# Patient Record
Sex: Female | Born: 1945 | Race: White | Hispanic: No | Marital: Married | Smoking: Never smoker
Health system: Southern US, Community
[De-identification: ages and names within clinical notes are randomized; demographics above are authoritative.]

## PROBLEM LIST (undated history)

## (undated) ENCOUNTER — Encounter: Attending: Geriatric Medicine | Primary: Geriatric Medicine

## (undated) ENCOUNTER — Ambulatory Visit

## (undated) ENCOUNTER — Encounter

## (undated) ENCOUNTER — Encounter: Attending: Hematology & Oncology | Primary: Hematology & Oncology

## (undated) ENCOUNTER — Inpatient Hospital Stay

## (undated) ENCOUNTER — Telehealth

## (undated) ENCOUNTER — Encounter: Attending: Surgical Oncology | Primary: Surgical Oncology

## (undated) ENCOUNTER — Ambulatory Visit: Payer: Medicare (Managed Care)

## (undated) ENCOUNTER — Telehealth: Attending: Family Medicine | Primary: Family Medicine

## (undated) ENCOUNTER — Ambulatory Visit: Payer: MEDICARE

## (undated) ENCOUNTER — Telehealth: Attending: Surgical Oncology | Primary: Surgical Oncology

## (undated) ENCOUNTER — Encounter: Attending: Anesthesiology | Primary: Anesthesiology

## (undated) ENCOUNTER — Telehealth: Attending: Neurology | Primary: Neurology

## (undated) ENCOUNTER — Encounter: Payer: MEDICARE | Attending: Geriatric Medicine | Primary: Geriatric Medicine

## (undated) ENCOUNTER — Telehealth
Attending: Student in an Organized Health Care Education/Training Program | Primary: Student in an Organized Health Care Education/Training Program

## (undated) ENCOUNTER — Encounter: Attending: Family | Primary: Family

## (undated) ENCOUNTER — Ambulatory Visit
Payer: Medicare (Managed Care) | Attending: Student in an Organized Health Care Education/Training Program | Primary: Student in an Organized Health Care Education/Training Program

## (undated) ENCOUNTER — Encounter: Attending: Radiation Oncology | Primary: Radiation Oncology

## (undated) ENCOUNTER — Telehealth: Attending: Hematology & Oncology | Primary: Hematology & Oncology

## (undated) ENCOUNTER — Encounter: Attending: Family Medicine | Primary: Family Medicine

## (undated) ENCOUNTER — Other Ambulatory Visit

## (undated) ENCOUNTER — Encounter: Attending: Diagnostic Radiology | Primary: Diagnostic Radiology

## (undated) ENCOUNTER — Telehealth: Attending: Geriatric Medicine | Primary: Geriatric Medicine

## (undated) ENCOUNTER — Ambulatory Visit: Attending: Surgical Oncology | Primary: Surgical Oncology

## (undated) ENCOUNTER — Ambulatory Visit
Attending: Student in an Organized Health Care Education/Training Program | Primary: Student in an Organized Health Care Education/Training Program

## (undated) ENCOUNTER — Ambulatory Visit: Payer: Medicare (Managed Care) | Attending: Hematology & Oncology | Primary: Hematology & Oncology

## (undated) ENCOUNTER — Ambulatory Visit: Payer: MEDICARE | Attending: Radiation Oncology | Primary: Radiation Oncology

## (undated) ENCOUNTER — Encounter: Payer: MEDICARE | Attending: Radiation Oncology | Primary: Radiation Oncology

## (undated) ENCOUNTER — Ambulatory Visit: Attending: Radiation Oncology | Primary: Radiation Oncology

## (undated) DIAGNOSIS — I10 Essential (primary) hypertension: Secondary | ICD-10-CM

## (undated) DIAGNOSIS — J320 Chronic maxillary sinusitis: Secondary | ICD-10-CM

## (undated) HISTORY — DX: Essential (primary) hypertension: I10

## (undated) HISTORY — DX: Chronic maxillary sinusitis: J32.0

---

## 2008-03-15 ENCOUNTER — Ambulatory Visit: Payer: Self-pay

## 2013-09-21 DIAGNOSIS — I10 Essential (primary) hypertension: Secondary | ICD-10-CM | POA: Insufficient documentation

## 2013-09-21 HISTORY — DX: Essential (primary) hypertension: I10

## 2014-06-29 ENCOUNTER — Ambulatory Visit: Payer: Self-pay | Admitting: Family Medicine

## 2015-02-28 ENCOUNTER — Ambulatory Visit: Payer: Medicare Other

## 2015-02-28 ENCOUNTER — Encounter: Payer: Self-pay | Admitting: Emergency Medicine

## 2015-02-28 ENCOUNTER — Ambulatory Visit
Admission: EM | Admit: 2015-02-28 | Discharge: 2015-02-28 | Disposition: A | Discharge status: Home / Self Care | Payer: Medicare Other | Attending: Family Medicine | Admitting: Family Medicine

## 2015-02-28 DIAGNOSIS — M25561 Pain in right knee: Secondary | ICD-10-CM | POA: Diagnosis not present

## 2015-02-28 HISTORY — DX: Essential (primary) hypertension: I10

## 2015-02-28 MED ORDER — MELOXICAM 7.5 MG PO TABS: 7.5000 mg | ORAL_TABLET | Freq: Every day | ORAL | Status: AC

## 2015-02-28 MED ORDER — TRAMADOL HCL 50 MG PO TABS: 50.0000 mg | ORAL_TABLET | Freq: Three times a day (TID) | ORAL | Status: AC | PRN

## 2015-02-28 NOTE — ED Provider Notes (Signed)
[facility] Emergency Department [place]ama Note  ____________________________________________  Time seen: Approximately 1045 AM  I have reviewed the triage vital signs and the nursing notes.   HISTORY  Chief Complaint Knee Pain   HPI Brandi Hawkins is a 69 y.o. female  presents with a complaint of right knee pain. Patient reports that 3 days ago she was at home. Patient states that she has multiple areas of stairs in her house. Patient states that as she was walking up the stairs she had onset of knee pain. Patient states that went to step up on stair and and it felt like her right knee locked and became painful, and she heard a pop when she put weight on knee with onset of pain. States she thinks she twisted her knee awkwardly during this motion.States that her knee felt like it was going to give out. Patient states that she did fall forward but she caught herself with her hands. Denies head injury or loss of consciousness. Patient states that she does not think she hit her knee on steps. States pain onset was with stepping upwards.   States knee pain was acute onset when walking upstairs. States knee pain is present anterior knee. Denies pain radiation. Denies calf pain. Denies leg swelling. States current pain is 2 out of 10 but states pain with walking is 6 out of 10 and feels like her knee will give out and not hold her weight. Denies previous knee pain.  Denies other pain or injury. Reports continues to ambulate but with pain.   Past Medical History  Diagnosis Date  . Hypertension     There are no active problems to display for this patient.   History reviewed. No pertinent past surgical history.  Current Outpatient Rx  Name  Route  Sig  Dispense  Refill  . amLODipine (NORVASC) 5 MG tablet   Oral   Take 5 mg by mouth daily.         Marland Kitchen lisinopril (PRINIVIL,ZESTRIL) 20 MG tablet   Oral   Take 20 mg by mouth daily.                                  Allergies Sulfur  History reviewed. No pertinent family history.  Social History Social History  Substance Use Topics  . Smoking status: Never Smoker   . Smokeless tobacco: None  . Alcohol Use: Yes    Review of Systems Constitutional: No fever/chills Eyes: No visual changes. ENT: No sore throat. Cardiovascular: Denies chest pain. Respiratory: Denies shortness of breath. Gastrointestinal: No abdominal pain.  No nausea, no vomiting.  No diarrhea.  No constipation. Genitourinary: Negative for dysuria. Musculoskeletal: Negative for back pain. Right knee pain.  Skin: Negative for rash. Neurological: Negative for headaches, focal weakness or numbness.  10-point ROS otherwise negative.  ____________________________________________   PHYSICAL EXAM:  VITAL SIGNS: ED Triage Vitals  Enc Vitals Group     BP 02/28/15 0946 137/63 mmHg     Pulse Rate 02/28/15 0946 111     Resp 02/28/15 0946 20     Temp 02/28/15 0946 98.4 F (36.9 C)     Temp Source 02/28/15 0946 Tympanic     SpO2 02/28/15 0946 99 %     Weight 02/28/15 0946 220 lb (99.791 kg)     Height 02/28/15 0946  (1.651 m)     Head Cir --  Peak Flow --      Pain Score 02/28/15 0949 10     Pain Loc --      Pain Edu? --      Excl. in GC? --    Today's Vitals   02/28/15 0946 02/28/15 0949 02/28/15 1048 02/28/15 1101  BP: 137/63  124/58   Pulse: 111  99   Temp: 98.4 F (36.9 C)  96.3 F (35.7 C)   TempSrc: Tympanic  Tympanic   Resp: 20  17   Height:  (1.651 m)     Weight: 220 lb (99.791 kg)     SpO2: 99%  99%   PainSc:  10-Worst pain ever 5  5      Constitutional: Alert and oriented. Well appearing and in no acute distress. Eyes: Conjunctivae are normal. PERRL. EOMI. Head: Atraumatic.  Nose: No congestion/rhinnorhea.  Mouth/Throat: Mucous membranes are moist.  Neck: No stridor.  No cervical spine tenderness to palpation. Hematological/Lymphatic/Immunilogical: No cervical  lymphadenopathy. Cardiovascular: Normal rate, regular rhythm. Grossly normal heart sounds.  Good peripheral circulation. Respiratory: Normal respiratory effort.  No retractions. Lungs CTAB. Gastrointestinal: Soft and nontender. Obese abdomen. Normal Bowel sounds. No CVA tenderness. Musculoskeletal: No lower or upper extremity tenderness nor edema.  No joint effusions. Bilateral pedal pulses equal and easily palpated. No cervical, thoracic or lumbar TTP. No calf tenderness or left or right leg.  Except: Right anterior knee mild TTP, mild pain with anterior and posterior drawer test, no pain with medial or lateral stress test, no posterior knee pain, calf nontender, no swelling, no ecchymosis. Pain increases to anterior knee with weight bearing. Mild antalgic gait. No calf tenderness. Right leg otherwise nontender.  Neurologic:  Normal speech and language. No gross focal neurologic deficits are appreciated.  Skin:  Skin is warm, dry and intact. No rash noted. Psychiatric: Mood and affect are normal. Speech and behavior are normal.  ____________________________________________   LABS (all labs ordered are listed, but only abnormal results are displayed)  Labs Reviewed - No data to display  RADIOLOGY  RIGHT KNEE - COMPLETE 4+ VIEW  COMPARISON: None.  FINDINGS: There is no evidence of fracture, dislocation, or joint effusion. There is severe osteopenia. There are small lateral femorotibial compartment marginal osteophytes. There is moderate lateral patellofemoral compartment osteoarthritis with joint space narrowing. Soft tissues are unremarkable.  IMPRESSION: No acute osseous injury of the right knee.   Electronically Signed By: Elige Ko On: 02/28/2015 10:16  I, Renford Dills, personally viewed and evaluated these images (plain radiographs) as part of my medical decision making.   ____________________________________________   PROCEDURES  Procedure(s)  performed:  Right knee immobilizer applied by RN. Neurovascular intact post application.   INITIAL IMPRESSION / ASSESSMENT AND PLAN / ED COURSE  Pertinent labs & imaging results that were available during my care of the patient were reviewed by me and considered in my medical decision making (see chart for details).   Very well appearing patient. Presents for complaints of right knee pain. Onset of right knee pain while walking up steps at home. States knee felt like knee locked then popped with onset of pain to front of knee. States she thinks she may have twisted her knee leading to pain onset.   Reports continued knee pain with ambulation and feeling that knee can not hold her weight. Denies calf pain, pain radiation, redness swelling or other complaints. Denies other injury. Will evaluate xray.   Right knee xray no acute osseous injury, severe ostopenia,  moderate lateral patellofemoral compartment osteoarthritis with joint space narrowing. Right knee pain post popping pain sensation while walking up steps and possible twisting movement. Concerned for internal injury including ligamentous or meniscus injury. Will place in knee immobilizer, RX for walker given for support, and treat with mobic and prn tramadol. Discussed with patient to follow-up closely with her primary care physician Dr. Elmer Ramp. Also discussed with patient to also follow-up closely with orthopedic especially if pain continues over the next several days, orthopedic on call information given. Discussed the follow-up with orthopedic for possible internal knee injury such as meniscus or ligamentous injury. Rest ice and elevate.Discussed follow up with Primary care physician this week. Discussed follow up and return parameters including no resolution or any worsening concerns. Patient verbalized understanding and agreed to plan.  ____________________________________________   FINAL CLINICAL IMPRESSION(S) / ED DIAGNOSES  Final  diagnoses:  Right knee pain         Renford Dills, NP 02/28/15 1158  Renford Dills, NP 02/28/15 1159

## 2015-02-28 NOTE — ED Notes (Signed)
Pt with right knee pain after a fall

## 2015-02-28 NOTE — Discharge Instructions (Signed)
Take medication as prescribed. Do not take additional NSAIDS such as advil, aleve or ibuprofen with Mobic. Wear knee immobilizer for support. Use walker. Rest. Apply ice. Avoid strenuous activity.   Follow up with your primary care physician this week as needed. Follow up with orthopedic next week for continued pain. See above to call. Return to Urgent care for new or worsening concerns.   Knee Pain Knee pain can be a result of an injury or other medical conditions. Treatment will depend on the cause of your pain. HOME CARE  Only take medicine as told by your doctor.  Keep a healthy weight. Being overweight can make the knee hurt more.  Stretch before exercising or playing sports.  If there is constant knee pain, change the way you exercise. Ask your doctor for advice.  Make sure shoes fit well. Choose the right shoe for the sport or activity.  Protect your knees. Wear kneepads if needed.  Rest when you are tired. GET HELP RIGHT AWAY IF:   Your knee pain does not stop.  Your knee pain does not get better.  Your knee joint feels hot to the touch.  You have a fever. MAKE SURE YOU:   Understand these instructions.  Will watch this condition.  Will get help right away if you are not doing well or get worse. Document Released: 08/15/2008 Document Revised: 08/11/2011 Document Reviewed: 08/15/2008 [facility] Patient Information 2015 [place], [place]. This information is not intended to replace advice given to you by your health care provider. Make sure you discuss any questions you have with your health care provider.

## 2015-06-18 DIAGNOSIS — J320 Chronic maxillary sinusitis: Secondary | ICD-10-CM

## 2015-06-18 HISTORY — DX: Chronic maxillary sinusitis: J32.0

## 2015-06-20 ENCOUNTER — Ambulatory Visit
Admission: RE | Admit: 2015-06-20 | Discharge: 2015-06-20 | Disposition: A | Discharge status: Home / Self Care | Payer: Medicare Other | Source: Ambulatory Visit | Attending: Unknown Physician Specialty | Admitting: Unknown Physician Specialty

## 2015-06-20 ENCOUNTER — Other Ambulatory Visit: Payer: Self-pay | Admitting: Unknown Physician Specialty

## 2015-06-20 DIAGNOSIS — R059 Cough, unspecified: Secondary | ICD-10-CM

## 2015-06-20 DIAGNOSIS — R042 Hemoptysis: Secondary | ICD-10-CM | POA: Diagnosis not present

## 2015-06-20 DIAGNOSIS — R05 Cough: Secondary | ICD-10-CM | POA: Diagnosis present

## 2015-09-12 ENCOUNTER — Other Ambulatory Visit: Payer: Self-pay | Admitting: Physician Assistant

## 2015-09-12 DIAGNOSIS — R0602 Shortness of breath: Secondary | ICD-10-CM

## 2015-09-18 ENCOUNTER — Ambulatory Visit
Admission: RE | Admit: 2015-09-18 | Discharge: 2015-09-18 | Disposition: A | Discharge status: Home / Self Care | Payer: Medicare Other | Source: Ambulatory Visit | Attending: Physician Assistant | Admitting: Physician Assistant

## 2015-09-18 DIAGNOSIS — R0602 Shortness of breath: Secondary | ICD-10-CM | POA: Insufficient documentation

## 2015-09-18 DIAGNOSIS — R079 Chest pain, unspecified: Secondary | ICD-10-CM | POA: Insufficient documentation

## 2015-09-18 MED ORDER — TECHNETIUM TC 99M SESTAMIBI - CARDIOLITE
31.8500 | Freq: Once | INTRAVENOUS | Status: AC | PRN
  Administered 2015-09-18: 31.85 via INTRAVENOUS

## 2015-09-18 MED ORDER — TECHNETIUM TC 99M SESTAMIBI - CARDIOLITE
12.6100 | Freq: Once | INTRAVENOUS | Status: AC | PRN
Start: 2015-09-18 — End: 2015-09-18
  Administered 2015-09-18: 09:00:00 12.61 via INTRAVENOUS

## 2015-09-18 MED ORDER — REGADENOSON 0.4 MG/5ML IV SOLN
0.4000 mg | Freq: Once | INTRAVENOUS | Status: AC
  Administered 2015-09-18: 0.4 mg via INTRAVENOUS

## 2015-09-19 LAB — NM MYOCAR MULTI W/SPECT W/WALL MOTION / EF
CHL CUP NUCLEAR SDS: 0
CHL CUP STRESS STAGE 1 HR: 90 {beats}/min
CHL CUP STRESS STAGE 1 SPEED: 0 mph
CHL CUP STRESS STAGE 3 GRADE: 0 %
CHL CUP STRESS STAGE 3 HR: 94 {beats}/min
CHL CUP STRESS STAGE 3 SPEED: 0 mph
CHL CUP STRESS STAGE 4 DBP: 65 mmHg
CSEPEW: 1 METS
CSEPPHR: 94 {beats}/min
CSEPPMHR: 62 %
Exercise duration (min): 1 min
LVDIAVOL: 109 mL (ref 46–106)
LVSYSVOL: 48 mL
MPHR: 151 {beats}/min
Percent HR: 64 %
Rest HR: 90 {beats}/min
SRS: 0
SSS: 0
Stage 1 Grade: 0 %
Stage 2 Grade: 0 %
Stage 2 HR: 90 {beats}/min
Stage 2 Speed: 0 mph
Stage 4 Grade: 0 %
Stage 4 HR: 93 {beats}/min
Stage 4 SBP: 155 mmHg
Stage 4 Speed: 0 mph
TID: 0.87

## 2016-04-04 ENCOUNTER — Encounter: Payer: Self-pay | Admitting: Physician Assistant

## 2016-04-04 ENCOUNTER — Telehealth: Payer: Self-pay | Admitting: Gastroenterology

## 2016-04-07 ENCOUNTER — Encounter: Payer: Self-pay | Admitting: Gastroenterology

## 2016-04-07 NOTE — Telephone Encounter (Signed)
Left voice message for patient to call and schedule with GI for Hepatosplenomegaly with fatty liver,suspected biopsy needed referred by Beverely RisenFozia Khan. Letter sent

## 2016-04-30 ENCOUNTER — Encounter: Payer: Self-pay | Admitting: Gastroenterology

## 2016-04-30 ENCOUNTER — Other Ambulatory Visit: Payer: Self-pay

## 2016-04-30 ENCOUNTER — Ambulatory Visit (INDEPENDENT_AMBULATORY_CARE_PROVIDER_SITE_OTHER): Payer: Medicare Other | Admitting: Gastroenterology

## 2016-04-30 VITALS — BP 161/71 | HR 95 | Temp 98.3°F | Ht 64.0 in | Wt 224.0 lb

## 2016-04-30 DIAGNOSIS — R748 Abnormal levels of other serum enzymes: Secondary | ICD-10-CM | POA: Diagnosis not present

## 2016-04-30 NOTE — Progress Notes (Signed)
  Gastroenterology Consultation  Referring Provider:    F. Khan MD Primary Care Physician:  F. Khan MD Primary Gastroenterologist:  Dr. Wohl     Reason for Consultation:     Abnormal liver enzymes        HPI:   Brandi Hawkins is a 70 y.o. y/o female referred for consultation & management of Abnormal liver enzymes by Dr. Virk, Charanjit, MD.  As patient comes in today with a history of abnormal liver enzymes. The patient states she has been under a lot of stress this last year with her dog dying, both the daughters being diagnosed with illnesses and having a lot on her plate as she states it. The patient reports that she was also having increased amounts of alcohol use with 3 glasses of wine a day for a couple of months. She states it helps her sleep because of all the stress. The patient was also found to have a negative acute hepatitis panel and she was found to have elevated iron studies with a slightly elevated ferritin. The patient denies any nausea vomiting black stools or bloody stools. The patient also has never had a screening colonoscopy. The patient recently changed her primary care provider to Dr. Khan. He had an right upper quadrant ultrasound that showed her to have fatty liver with borderline splenomegaly.   Past Medical History:  Diagnosis Date  . 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 HTN (hypertension) 09/21/2013  . Hypertension     History reviewed. No pertinent surgical history.  Prior to Admission medications   Medication Sig Start Date End Date Taking? Authorizing Provider  cetirizine (ZYRTEC) 10 MG tablet Take by mouth.   Yes Historical Provider, MD  hydrochlorothiazide (HYDRODIURIL) 25 MG tablet  04/22/16  Yes Historical Provider, MD  metoprolol succinate (TOPROL-XL) 50 MG 24 hr tablet  04/22/16  Yes Historical Provider, MD  amLODipine (NORVASC) 5 MG tablet Take 5 mg by mouth daily.    Historical Provider, MD  furosemide (LASIX) 20 MG tablet Take by mouth. 07/17/15  07/16/16  Historical Provider, MD  meloxicam (MOBIC) 7.5 MG tablet Take 1 tablet (7.5 mg total) by mouth daily. Patient not taking: Reported on 04/30/2016 02/28/15   Lindsey Miller, NP  traMADol (ULTRAM) 50 MG tablet Take 1 tablet (50 mg total) by mouth every 8 (eight) hours as needed (Do not drive or operate machinery while taking as can cause drowsiness.). Patient not taking: Reported on 04/30/2016 02/28/15   Lindsey Miller, NP    Family History  Problem Relation Age of Onset  . Diabetes Brother   . Diabetes Paternal Grandmother      Social History  Substance Use Topics  . Smoking status: Never Smoker  . Smokeless tobacco: Never Used  . Alcohol use Yes    Allergies as of 04/30/2016 - Review Complete 04/30/2016  Allergen Reaction Noted  . Sulfur Anaphylaxis 02/28/2015    Review of Systems:    All systems reviewed and negative except where noted in HPI.   Physical Exam:  BP (!) 161/71   Pulse 95   Temp 98.3 F (36.8 C) (Oral)   Ht 5' 4" (1.626 m)   Wt 224 lb (101.6 kg)   BMI 38.45 kg/m  No LMP recorded. Patient is postmenopausal. Psych:  Alert and cooperative. Normal mood and affect. General:   Alert,  Well-developed, well-nourished, pleasant and cooperative in NAD Head:  Normocephalic and atraumatic. Eyes:  Sclera clear, no icterus.   Conjunctiva pink. Ears:    Normal auditory acuity. Nose:  No deformity, discharge, or lesions. Mouth:  No deformity or lesions,oropharynx pink & moist. Neck:  Supple; no masses or thyromegaly. Lungs:  Respirations even and unlabored.  Clear throughout to auscultation.   No wheezes, crackles, or rhonchi. No acute distress. Heart:  Regular rate and rhythm; no murmurs, clicks, rubs, or gallops. Abdomen:  Normal bowel sounds.  No bruits.  Soft, non-tender and non-distended without masses, hepatosplenomegaly or hernias noted.  No guarding or rebound tenderness.  Negative Carnett sign.   Rectal:  Deferred.  Msk:  Symmetrical without gross  deformities.  Good, equal movement & strength bilaterally. Pulses:  Normal pulses noted. Extremities:  No clubbing or edema.  No cyanosis. Neurologic:  Alert and oriented x3;  grossly normal neurologically. Skin:  Intact without significant lesions or rashes.  No jaundice. Lymph Nodes:  No significant cervical adenopathy. Psych:  Alert and cooperative. Normal mood and affect.  Imaging Studies: No results found.  Assessment and Plan:   Brandi Hawkins is a 70 y.o. y/o female Who comes in today with an ultrasound showing fatty liver and abnormal liver enzymes with AST being higher than ALT with increased alk phosphatase and high iron levels. The patient will have her labs sent off for possible causes of her abnormal liver enzymes. She will also try and lose weight. The patient has a ready stopped her alcohol use. The patient acute hepatitis panel was negative. The patient has never had a colonoscopy and will be set up for screening colonoscopy. The patient has been explained the plan and agrees with it   Lucilla Lame, MD. Marval Regal   Note: This dictation was prepared with Dragon dictation along with smaller phrase technology. Any transcriptional errors that result from this process are unintentional.

## 2016-05-05 LAB — ALPHA-1-ANTITRYPSIN: A1 ANTITRYPSIN: 143 mg/dL (ref 90–200)

## 2016-05-05 LAB — HEMOCHROMATOSIS DNA-PCR(C282Y,H63D)

## 2016-05-05 LAB — HEPATIC FUNCTION PANEL
ALBUMIN: 4.1 g/dL (ref 3.6–4.8)
ALK PHOS: 129 IU/L — AB (ref 39–117)
ALT: 25 IU/L (ref 0–32)
AST: 56 IU/L — AB (ref 0–40)
BILIRUBIN TOTAL: 3.6 mg/dL — AB (ref 0.0–1.2)
Bilirubin, Direct: 1.01 mg/dL — ABNORMAL HIGH (ref 0.00–0.40)
TOTAL PROTEIN: 8 g/dL (ref 6.0–8.5)

## 2016-05-05 LAB — ANTI-SMOOTH MUSCLE ANTIBODY, IGG: SMOOTH MUSCLE AB: 20 U — AB (ref 0–19)

## 2016-05-05 LAB — MITOCHONDRIAL ANTIBODIES: Mitochondrial Ab: 11.1 Units (ref 0.0–20.0)

## 2016-05-05 LAB — IGG, IGA, IGM
IGA/IMMUNOGLOBULIN A, SERUM: 722 mg/dL — AB (ref 87–352)
IGM (IMMUNOGLOBULIN M), SRM: 97 mg/dL (ref 26–217)
IgG (Immunoglobin G), Serum: 1940 mg/dL — ABNORMAL HIGH (ref 700–1600)

## 2016-05-05 LAB — ANA: Anti Nuclear Antibody(ANA): NEGATIVE

## 2016-05-05 LAB — CERULOPLASMIN: Ceruloplasmin: 23.7 mg/dL (ref 19.0–39.0)

## 2016-05-09 ENCOUNTER — Other Ambulatory Visit: Payer: Self-pay

## 2016-05-28 ENCOUNTER — Telehealth: Payer: Self-pay

## 2016-05-28 NOTE — Telephone Encounter (Signed)
-----   Message from Midge Miniumarren Wohl, MD sent at 05/27/2016  8:26 AM EST ----- Please have the patient come in for a follow up.

## 2016-05-28 NOTE — Telephone Encounter (Signed)
LVM for pt to return my call to schedule follow up appt to discuss lab results.

## 2016-06-12 NOTE — Telephone Encounter (Signed)
Pt has been scheduled for a follow up appt with Dr. Servando SnareWohl on 06/17/16.

## 2016-06-17 ENCOUNTER — Encounter: Payer: Self-pay | Admitting: Gastroenterology

## 2016-06-17 ENCOUNTER — Ambulatory Visit (INDEPENDENT_AMBULATORY_CARE_PROVIDER_SITE_OTHER): Payer: Medicare Other | Admitting: Gastroenterology

## 2016-06-17 VITALS — BP 151/72 | HR 95 | Temp 98.0°F | Ht 64.0 in | Wt 221.5 lb

## 2016-06-17 DIAGNOSIS — R748 Abnormal levels of other serum enzymes: Secondary | ICD-10-CM

## 2016-06-17 NOTE — Progress Notes (Signed)
Primary Care Physician: Sula RumpleVirk, Charanjit, MD  Primary Gastroenterologist:  Dr. Midge Miniumarren Zyshawn Bohnenkamp  Chief Complaint  Patient presents with  . Follow up lab results    HPI: Brandi Hawkins is a 71 y.o. female here for follow-up of abnormal liver enzymes.The patient had labs sent off at our last office visit that showed her to have increased IgG and borderline increase smooth muscle antibody.  The patient had a ultrasound of the abdomen that showed her to have borderline splenomegaly with hepatomegaly and fatty infiltration.  The patient states that she has been trying to lose weight.  She reports that when she gets nervous or has stress in her life she tends to eat more.  Current Outpatient Prescriptions  Medication Sig Dispense Refill  . amLODipine (NORVASC) 5 MG tablet Take 5 mg by mouth daily.    Marland Kitchen. aspirin EC 81 MG tablet Take 81 mg by mouth daily.    . cetirizine (ZYRTEC) 10 MG tablet Take by mouth.    . hydrochlorothiazide (HYDRODIURIL) 25 MG tablet     . metoprolol succinate (TOPROL-XL) 50 MG 24 hr tablet     . furosemide (LASIX) 20 MG tablet Take by mouth.    . meloxicam (MOBIC) 7.5 MG tablet Take 1 tablet (7.5 mg total) by mouth daily. (Patient not taking: Reported on 06/17/2016) 10 tablet 0  . traMADol (ULTRAM) 50 MG tablet Take 1 tablet (50 mg total) by mouth every 8 (eight) hours as needed (Do not drive or operate machinery while taking as can cause drowsiness.). (Patient not taking: Reported on 06/17/2016) 12 tablet 0   No current facility-administered medications for this visit.     Allergies as of 06/17/2016 - Review Complete 06/17/2016  Allergen Reaction Noted  . Sulfur Anaphylaxis 02/28/2015    ROS:  General: Negative for anorexia, weight loss, fever, chills, fatigue, weakness. ENT: Negative for hoarseness, difficulty swallowing , nasal congestion. CV: Negative for chest pain, angina, palpitations, dyspnea on exertion, peripheral edema.  Respiratory: Negative for dyspnea at  rest, dyspnea on exertion, cough, sputum, wheezing.  GI: See history of present illness. GU:  Negative for dysuria, hematuria, urinary incontinence, urinary frequency, nocturnal urination.  Endo: Negative for unusual weight change.    Physical Examination:   BP (!) 151/72   Pulse 95   Temp 98 F (36.7 C) (Oral)   Ht 5\' 4"  (1.626 m)   Wt 221 lb 8 oz (100.5 kg)   BMI 38.02 kg/m   General: Well-nourished, well-developed in no acute distress.  Eyes: No icterus. Conjunctivae pink. Mouth: Oropharyngeal mucosa moist and pink , no lesions erythema or exudate. Lungs: Clear to auscultation bilaterally. Non-labored. Heart: Regular rate and rhythm, no murmurs rubs or gallops.  Abdomen: Bowel sounds are normal, nontender, nondistended, no hepatosplenomegaly or masses, no abdominal bruits or hernia , no rebound or guarding.   Extremities: No lower extremity edema. No clubbing or deformities. Neuro: Alert and oriented x 3.  Grossly intact. Skin: Warm and dry, no jaundice.   Psych: Alert and cooperative, normal mood and affect.  Labs:    Imaging Studies: No results found.  Assessment and Plan:   Brandi Hawkins is a 71 y.o. y/o female who comes in today for follow-up of her abnormal liver enzymes. The patient had a slightly elevated SMA with a increased IgG which may be seen with autoimmune hepatitis.  The patient would like to hold off on any liver biopsies at this time and would like to have her labs  checked again today.  If the labs are going down the patient can be followed while she loses weight.  If the labs are still elevated then the patient will be given a choice of continued weight loss with rechecking in 1 month or being set up for a liver biopsy.  The patient has been explained the plan and agrees with it.    Midge Minium, MD. Clementeen Graham   Note: This dictation was prepared with Dragon dictation along with smaller phrase technology. Any transcriptional errors that result from this process  are unintentional.

## 2016-06-20 ENCOUNTER — Encounter: Payer: Self-pay | Admitting: Gastroenterology

## 2016-06-23 ENCOUNTER — Telehealth: Payer: Self-pay

## 2016-06-23 NOTE — Telephone Encounter (Signed)
-----   Message from Midge Miniumarren Wohl, MD sent at 06/22/2016  5:54 PM EST ----- The patient now that her liver enzymes are around the same level as they were before but not decreasing.  She has an option of repeating them in 1 month to see if they have gone down as she loses weight or being set up for a liver biopsy.

## 2016-06-23 NOTE — Telephone Encounter (Signed)
Pt notified of LFT results. Pt has decided to continue with weight loss to see if enzymes improve. Will contact pt in 1 month for repeat labs.

## 2016-09-30 IMAGING — CR DG KNEE COMPLETE 4+V*R*
4 series · 4 of 4 positions shown · non-contrast
Comparison: None.

CLINICAL DATA: Pain and swelling after fall

EXAM:
RIGHT KNEE - COMPLETE 4+ VIEW

[knee ap]
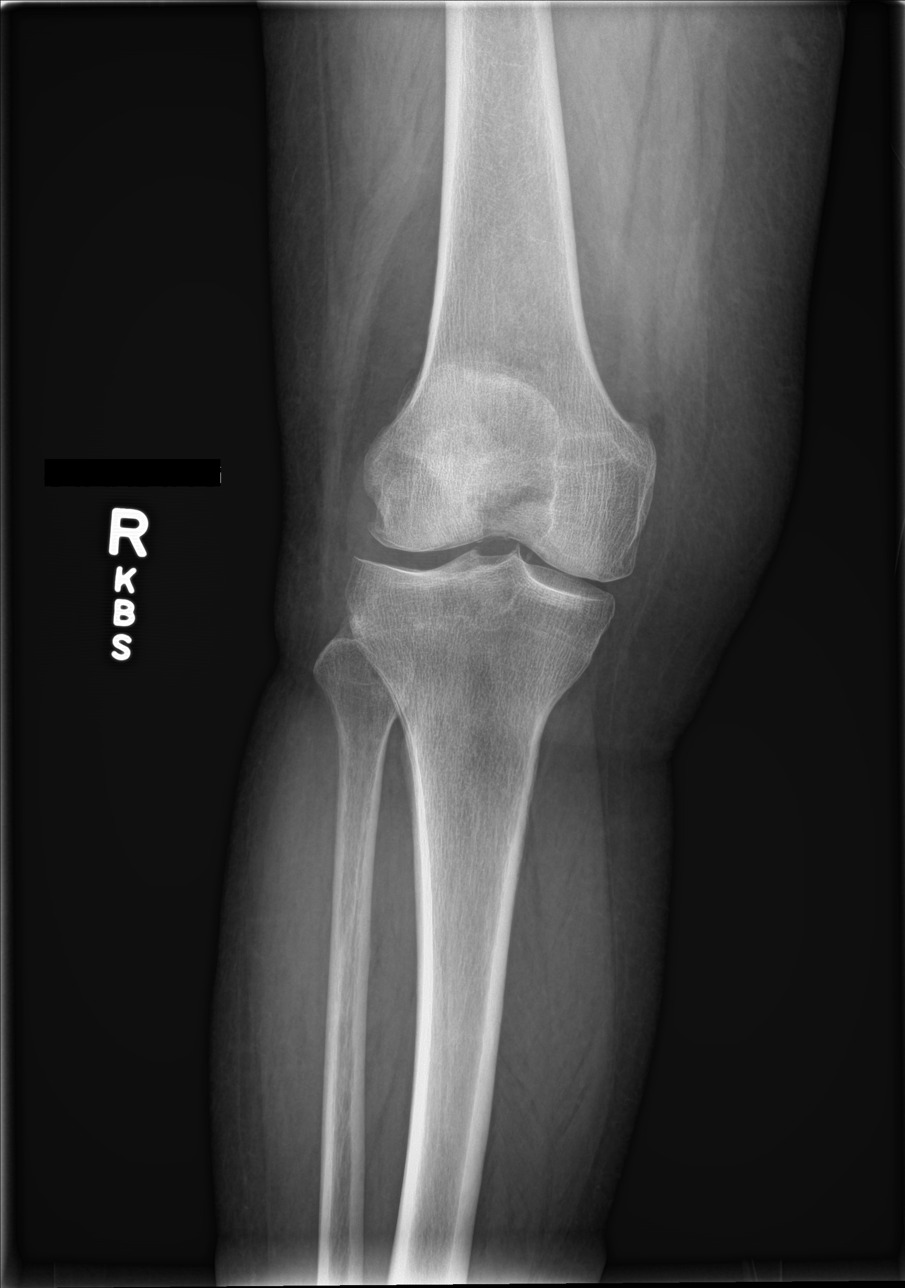

[tunnel]
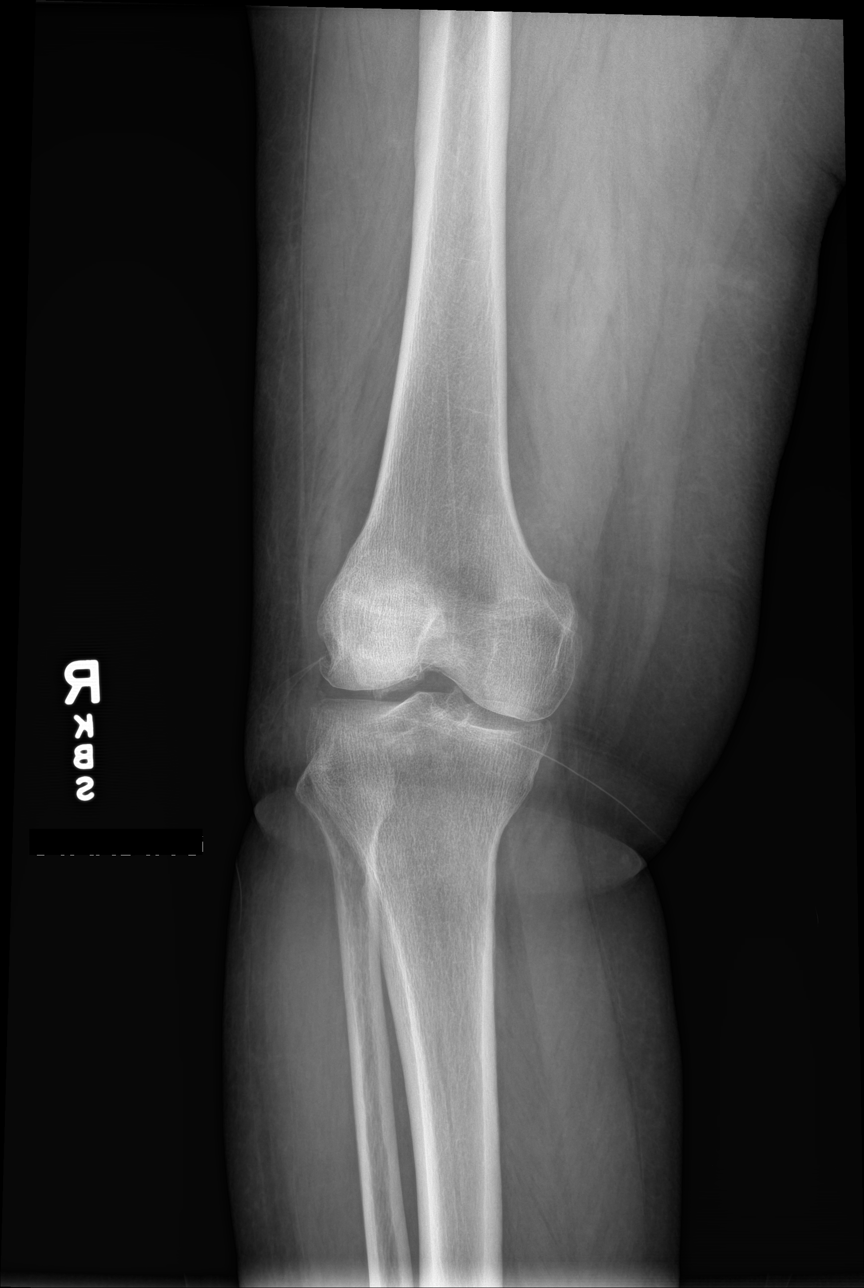

[knee lat]
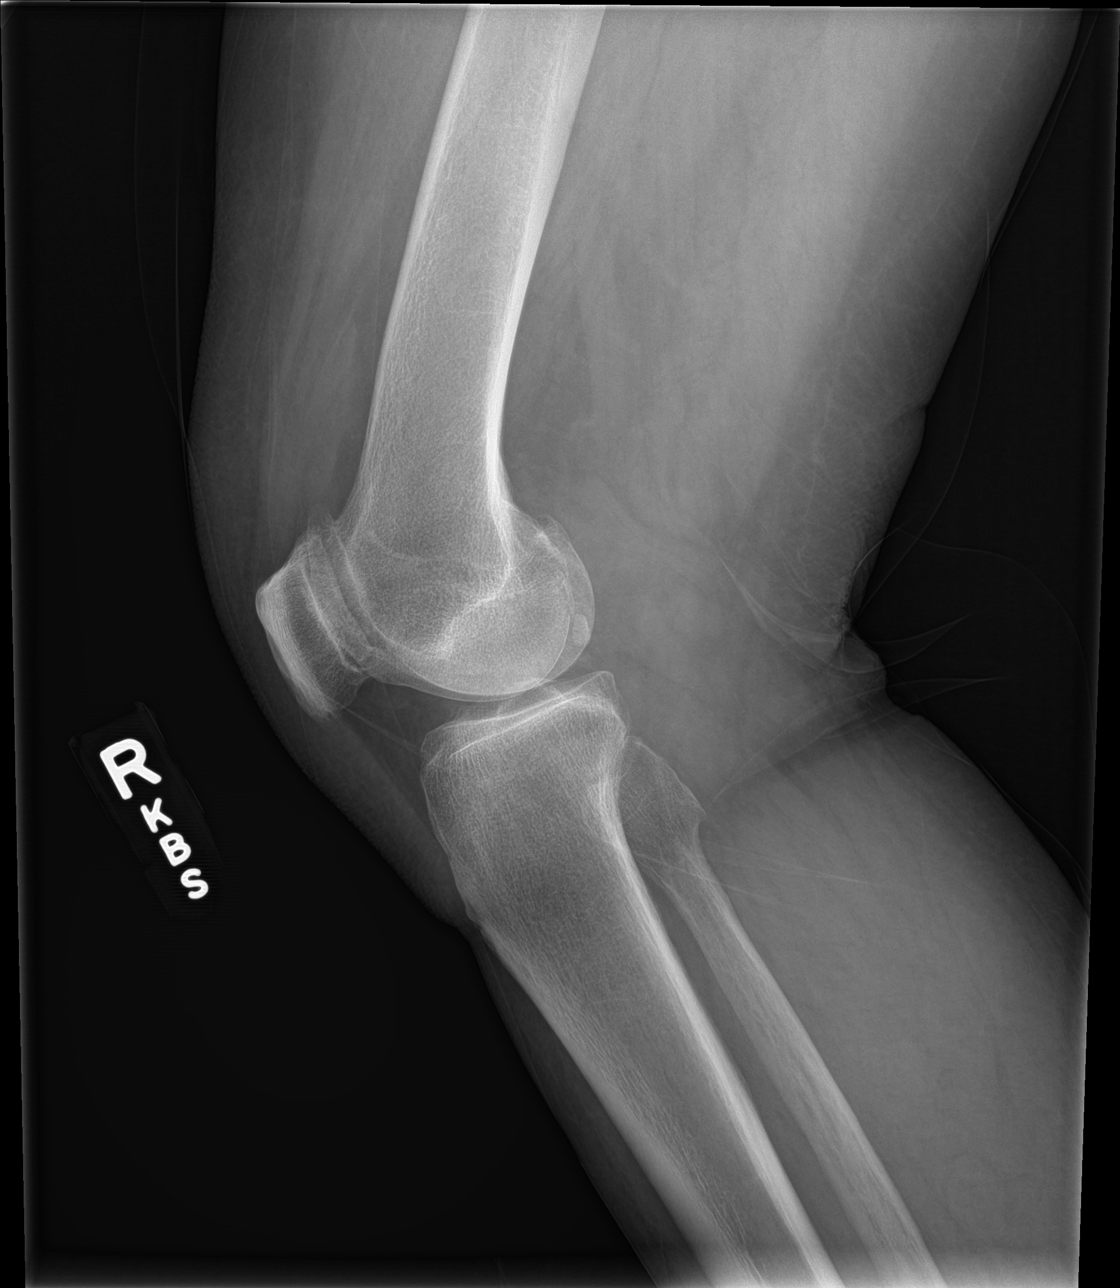

[patella skyline]
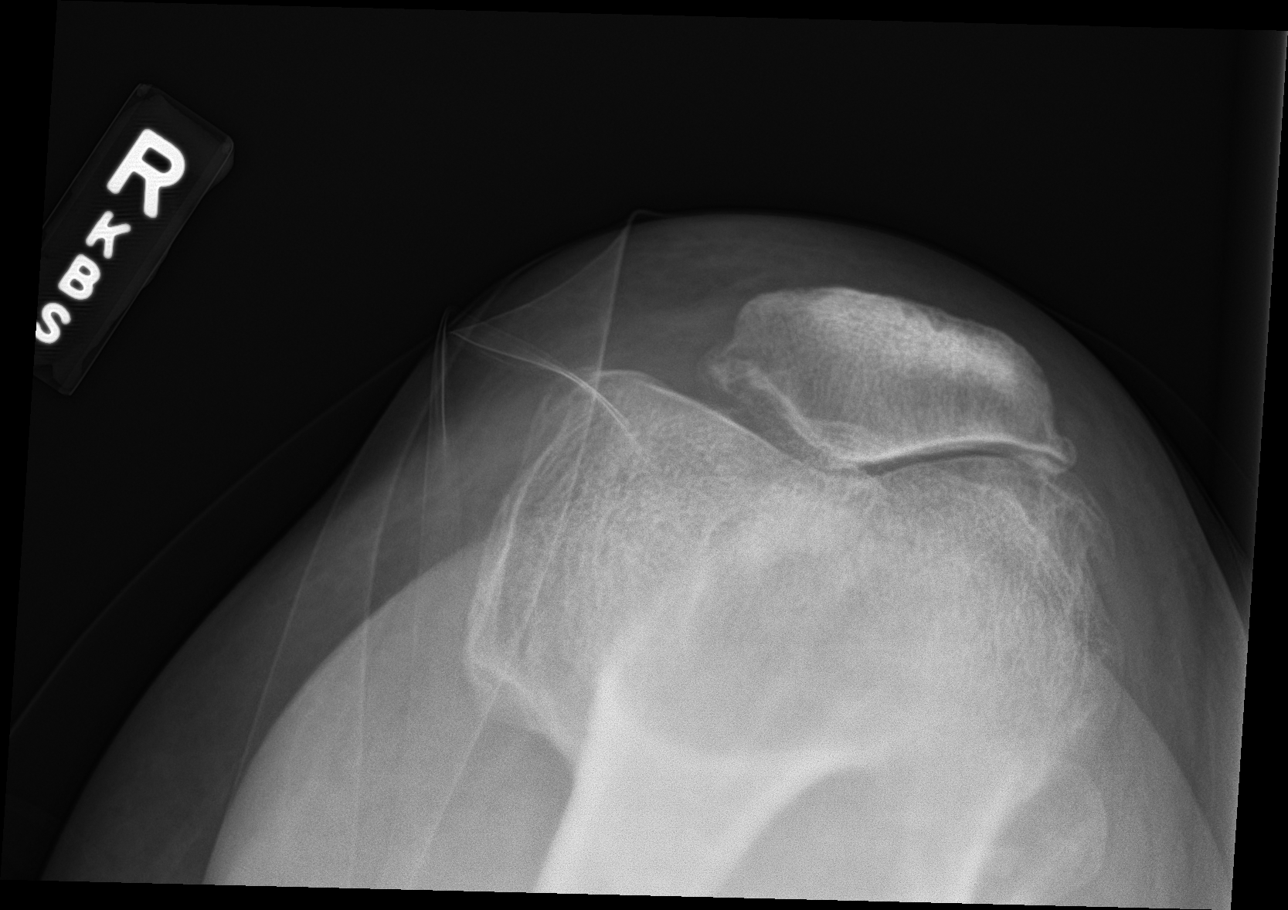

[4 of 4 positions shown; findings below may reference images not displayed]

FINDINGS: There is no evidence of fracture, dislocation, or joint effusion.
There is severe osteopenia. There are small lateral femorotibial
compartment marginal osteophytes. There is moderate lateral
patellofemoral compartment osteoarthritis with joint space
narrowing. Soft tissues are unremarkable.
IMPRESSION: No acute osseous injury of the right knee.

## 2017-06-03 ENCOUNTER — Ambulatory Visit
Admit: 2017-06-03 | Discharge: 2017-06-06 | Disposition: A | Discharge status: Home / Self Care | Payer: MEDICARE | Admitting: Family Medicine

## 2017-06-03 DIAGNOSIS — E876 Hypokalemia: Secondary | ICD-10-CM | POA: Insufficient documentation

## 2017-06-03 DIAGNOSIS — K709 Alcoholic liver disease, unspecified: Secondary | ICD-10-CM | POA: Insufficient documentation

## 2017-06-04 DIAGNOSIS — E876 Hypokalemia: Principal | ICD-10-CM

## 2017-06-05 DIAGNOSIS — J320 Chronic maxillary sinusitis: Secondary | ICD-10-CM

## 2017-06-05 DIAGNOSIS — E876 Hypokalemia: Secondary | ICD-10-CM

## 2017-06-05 DIAGNOSIS — K709 Alcoholic liver disease, unspecified: Principal | ICD-10-CM

## 2017-06-05 DIAGNOSIS — K766 Portal hypertension: Secondary | ICD-10-CM

## 2017-06-05 DIAGNOSIS — B373 Candidiasis of vulva and vagina: Secondary | ICD-10-CM

## 2017-06-05 DIAGNOSIS — N950 Postmenopausal bleeding: Secondary | ICD-10-CM

## 2017-06-05 DIAGNOSIS — I8500 Esophageal varices without bleeding: Secondary | ICD-10-CM

## 2017-06-05 DIAGNOSIS — I10 Essential (primary) hypertension: Secondary | ICD-10-CM

## 2017-06-05 DIAGNOSIS — F1020 Alcohol dependence, uncomplicated: Secondary | ICD-10-CM

## 2017-06-05 DIAGNOSIS — Z7982 Long term (current) use of aspirin: Secondary | ICD-10-CM

## 2017-06-09 ENCOUNTER — Ambulatory Visit: Admit: 2017-06-09 | Discharge: 2017-06-10 | Payer: MEDICARE | Attending: Family | Primary: Family

## 2017-06-09 DIAGNOSIS — R4182 Altered mental status, unspecified: Secondary | ICD-10-CM

## 2017-06-09 DIAGNOSIS — E876 Hypokalemia: Secondary | ICD-10-CM

## 2017-06-09 DIAGNOSIS — N950 Postmenopausal bleeding: Secondary | ICD-10-CM

## 2017-06-09 DIAGNOSIS — K709 Alcoholic liver disease, unspecified: Principal | ICD-10-CM

## 2017-06-09 DIAGNOSIS — I10 Essential (primary) hypertension: Secondary | ICD-10-CM

## 2017-06-10 ENCOUNTER — Encounter: Admit: 2017-06-10 | Discharge: 2017-07-29 | Payer: MEDICARE

## 2017-06-10 DIAGNOSIS — K766 Portal hypertension: Secondary | ICD-10-CM

## 2017-06-10 DIAGNOSIS — Z7982 Long term (current) use of aspirin: Secondary | ICD-10-CM

## 2017-06-10 DIAGNOSIS — I8500 Esophageal varices without bleeding: Secondary | ICD-10-CM

## 2017-06-10 DIAGNOSIS — E876 Hypokalemia: Secondary | ICD-10-CM

## 2017-06-10 DIAGNOSIS — J320 Chronic maxillary sinusitis: Secondary | ICD-10-CM

## 2017-06-10 DIAGNOSIS — I10 Essential (primary) hypertension: Secondary | ICD-10-CM

## 2017-06-10 DIAGNOSIS — K709 Alcoholic liver disease, unspecified: Principal | ICD-10-CM

## 2017-06-10 DIAGNOSIS — N950 Postmenopausal bleeding: Secondary | ICD-10-CM

## 2017-06-10 DIAGNOSIS — B373 Candidiasis of vulva and vagina: Secondary | ICD-10-CM

## 2017-06-10 DIAGNOSIS — F1020 Alcohol dependence, uncomplicated: Secondary | ICD-10-CM

## 2017-06-15 DIAGNOSIS — E876 Hypokalemia: Secondary | ICD-10-CM

## 2017-06-15 DIAGNOSIS — J320 Chronic maxillary sinusitis: Secondary | ICD-10-CM

## 2017-06-15 DIAGNOSIS — N950 Postmenopausal bleeding: Secondary | ICD-10-CM

## 2017-06-15 DIAGNOSIS — Z7982 Long term (current) use of aspirin: Secondary | ICD-10-CM

## 2017-06-15 DIAGNOSIS — B373 Candidiasis of vulva and vagina: Secondary | ICD-10-CM

## 2017-06-15 DIAGNOSIS — I8500 Esophageal varices without bleeding: Secondary | ICD-10-CM

## 2017-06-15 DIAGNOSIS — F1020 Alcohol dependence, uncomplicated: Secondary | ICD-10-CM

## 2017-06-15 DIAGNOSIS — K766 Portal hypertension: Secondary | ICD-10-CM

## 2017-06-15 DIAGNOSIS — I10 Essential (primary) hypertension: Secondary | ICD-10-CM

## 2017-06-15 DIAGNOSIS — K709 Alcoholic liver disease, unspecified: Principal | ICD-10-CM

## 2017-06-17 DIAGNOSIS — K766 Portal hypertension: Secondary | ICD-10-CM

## 2017-06-17 DIAGNOSIS — E876 Hypokalemia: Secondary | ICD-10-CM

## 2017-06-17 DIAGNOSIS — I8500 Esophageal varices without bleeding: Secondary | ICD-10-CM

## 2017-06-17 DIAGNOSIS — J320 Chronic maxillary sinusitis: Secondary | ICD-10-CM

## 2017-06-17 DIAGNOSIS — K709 Alcoholic liver disease, unspecified: Principal | ICD-10-CM

## 2017-06-17 DIAGNOSIS — F1020 Alcohol dependence, uncomplicated: Secondary | ICD-10-CM

## 2017-06-17 DIAGNOSIS — Z7982 Long term (current) use of aspirin: Secondary | ICD-10-CM

## 2017-06-17 DIAGNOSIS — N950 Postmenopausal bleeding: Secondary | ICD-10-CM

## 2017-06-17 DIAGNOSIS — B373 Candidiasis of vulva and vagina: Secondary | ICD-10-CM

## 2017-06-17 DIAGNOSIS — I10 Essential (primary) hypertension: Secondary | ICD-10-CM

## 2017-06-18 DIAGNOSIS — K766 Portal hypertension: Secondary | ICD-10-CM

## 2017-06-18 DIAGNOSIS — B373 Candidiasis of vulva and vagina: Secondary | ICD-10-CM

## 2017-06-18 DIAGNOSIS — N950 Postmenopausal bleeding: Secondary | ICD-10-CM

## 2017-06-18 DIAGNOSIS — K709 Alcoholic liver disease, unspecified: Principal | ICD-10-CM

## 2017-06-18 DIAGNOSIS — I10 Essential (primary) hypertension: Secondary | ICD-10-CM

## 2017-06-18 DIAGNOSIS — F1020 Alcohol dependence, uncomplicated: Secondary | ICD-10-CM

## 2017-06-18 DIAGNOSIS — J320 Chronic maxillary sinusitis: Secondary | ICD-10-CM

## 2017-06-18 DIAGNOSIS — E876 Hypokalemia: Secondary | ICD-10-CM

## 2017-06-18 DIAGNOSIS — Z7982 Long term (current) use of aspirin: Secondary | ICD-10-CM

## 2017-06-18 DIAGNOSIS — I8500 Esophageal varices without bleeding: Secondary | ICD-10-CM

## 2017-06-19 DIAGNOSIS — I8500 Esophageal varices without bleeding: Secondary | ICD-10-CM

## 2017-06-19 DIAGNOSIS — K709 Alcoholic liver disease, unspecified: Principal | ICD-10-CM

## 2017-06-19 DIAGNOSIS — J320 Chronic maxillary sinusitis: Secondary | ICD-10-CM

## 2017-06-19 DIAGNOSIS — E876 Hypokalemia: Secondary | ICD-10-CM

## 2017-06-19 DIAGNOSIS — N950 Postmenopausal bleeding: Secondary | ICD-10-CM

## 2017-06-19 DIAGNOSIS — F1020 Alcohol dependence, uncomplicated: Secondary | ICD-10-CM

## 2017-06-19 DIAGNOSIS — K766 Portal hypertension: Secondary | ICD-10-CM

## 2017-06-19 DIAGNOSIS — Z7982 Long term (current) use of aspirin: Secondary | ICD-10-CM

## 2017-06-19 DIAGNOSIS — B373 Candidiasis of vulva and vagina: Secondary | ICD-10-CM

## 2017-06-19 DIAGNOSIS — I10 Essential (primary) hypertension: Secondary | ICD-10-CM

## 2017-06-24 DIAGNOSIS — K766 Portal hypertension: Secondary | ICD-10-CM

## 2017-06-24 DIAGNOSIS — J320 Chronic maxillary sinusitis: Secondary | ICD-10-CM

## 2017-06-24 DIAGNOSIS — N950 Postmenopausal bleeding: Secondary | ICD-10-CM

## 2017-06-24 DIAGNOSIS — E876 Hypokalemia: Secondary | ICD-10-CM

## 2017-06-24 DIAGNOSIS — F1020 Alcohol dependence, uncomplicated: Secondary | ICD-10-CM

## 2017-06-24 DIAGNOSIS — I10 Essential (primary) hypertension: Secondary | ICD-10-CM

## 2017-06-24 DIAGNOSIS — Z7982 Long term (current) use of aspirin: Secondary | ICD-10-CM

## 2017-06-24 DIAGNOSIS — K709 Alcoholic liver disease, unspecified: Principal | ICD-10-CM

## 2017-06-24 DIAGNOSIS — B373 Candidiasis of vulva and vagina: Secondary | ICD-10-CM

## 2017-06-24 DIAGNOSIS — I8500 Esophageal varices without bleeding: Secondary | ICD-10-CM

## 2017-06-26 DIAGNOSIS — K709 Alcoholic liver disease, unspecified: Principal | ICD-10-CM

## 2017-06-26 DIAGNOSIS — B373 Candidiasis of vulva and vagina: Secondary | ICD-10-CM

## 2017-06-26 DIAGNOSIS — E876 Hypokalemia: Secondary | ICD-10-CM

## 2017-06-26 DIAGNOSIS — J320 Chronic maxillary sinusitis: Secondary | ICD-10-CM

## 2017-06-26 DIAGNOSIS — F1020 Alcohol dependence, uncomplicated: Secondary | ICD-10-CM

## 2017-06-26 DIAGNOSIS — N950 Postmenopausal bleeding: Secondary | ICD-10-CM

## 2017-06-26 DIAGNOSIS — K766 Portal hypertension: Secondary | ICD-10-CM

## 2017-06-26 DIAGNOSIS — I10 Essential (primary) hypertension: Secondary | ICD-10-CM

## 2017-06-26 DIAGNOSIS — Z7982 Long term (current) use of aspirin: Secondary | ICD-10-CM

## 2017-06-26 DIAGNOSIS — I8500 Esophageal varices without bleeding: Secondary | ICD-10-CM

## 2017-06-26 MED ORDER — LACTULOSE 10 GRAM/15 ML ORAL SOLUTION
Freq: Three times a day (TID) | ORAL | 1 refills | 0.00000 days | Status: CP
Start: 2017-06-26 — End: 2017-06-29

## 2017-06-29 DIAGNOSIS — J320 Chronic maxillary sinusitis: Secondary | ICD-10-CM

## 2017-06-29 DIAGNOSIS — I8500 Esophageal varices without bleeding: Secondary | ICD-10-CM

## 2017-06-29 DIAGNOSIS — I10 Essential (primary) hypertension: Secondary | ICD-10-CM

## 2017-06-29 DIAGNOSIS — E876 Hypokalemia: Secondary | ICD-10-CM

## 2017-06-29 DIAGNOSIS — Z7982 Long term (current) use of aspirin: Secondary | ICD-10-CM

## 2017-06-29 DIAGNOSIS — F1020 Alcohol dependence, uncomplicated: Secondary | ICD-10-CM

## 2017-06-29 DIAGNOSIS — N950 Postmenopausal bleeding: Secondary | ICD-10-CM

## 2017-06-29 DIAGNOSIS — K766 Portal hypertension: Secondary | ICD-10-CM

## 2017-06-29 DIAGNOSIS — K709 Alcoholic liver disease, unspecified: Principal | ICD-10-CM

## 2017-06-29 DIAGNOSIS — B373 Candidiasis of vulva and vagina: Secondary | ICD-10-CM

## 2017-06-29 MED ORDER — LACTULOSE 10 GRAM/15 ML ORAL SOLUTION
Freq: Three times a day (TID) | ORAL | 1 refills | 0.00000 days | Status: CP
Start: 2017-06-29 — End: 2017-08-26

## 2017-06-30 DIAGNOSIS — K709 Alcoholic liver disease, unspecified: Principal | ICD-10-CM

## 2017-06-30 DIAGNOSIS — E876 Hypokalemia: Secondary | ICD-10-CM

## 2017-06-30 DIAGNOSIS — F1020 Alcohol dependence, uncomplicated: Secondary | ICD-10-CM

## 2017-06-30 DIAGNOSIS — J320 Chronic maxillary sinusitis: Secondary | ICD-10-CM

## 2017-06-30 DIAGNOSIS — I8500 Esophageal varices without bleeding: Secondary | ICD-10-CM

## 2017-06-30 DIAGNOSIS — K766 Portal hypertension: Secondary | ICD-10-CM

## 2017-06-30 DIAGNOSIS — Z7982 Long term (current) use of aspirin: Secondary | ICD-10-CM

## 2017-06-30 DIAGNOSIS — B373 Candidiasis of vulva and vagina: Secondary | ICD-10-CM

## 2017-06-30 DIAGNOSIS — I10 Essential (primary) hypertension: Secondary | ICD-10-CM

## 2017-06-30 DIAGNOSIS — N950 Postmenopausal bleeding: Secondary | ICD-10-CM

## 2017-07-01 DIAGNOSIS — N950 Postmenopausal bleeding: Secondary | ICD-10-CM

## 2017-07-01 DIAGNOSIS — Z7982 Long term (current) use of aspirin: Secondary | ICD-10-CM

## 2017-07-01 DIAGNOSIS — B373 Candidiasis of vulva and vagina: Secondary | ICD-10-CM

## 2017-07-01 DIAGNOSIS — K709 Alcoholic liver disease, unspecified: Principal | ICD-10-CM

## 2017-07-01 DIAGNOSIS — J320 Chronic maxillary sinusitis: Secondary | ICD-10-CM

## 2017-07-01 DIAGNOSIS — F1020 Alcohol dependence, uncomplicated: Secondary | ICD-10-CM

## 2017-07-01 DIAGNOSIS — K766 Portal hypertension: Secondary | ICD-10-CM

## 2017-07-01 DIAGNOSIS — I8500 Esophageal varices without bleeding: Secondary | ICD-10-CM

## 2017-07-01 DIAGNOSIS — E876 Hypokalemia: Secondary | ICD-10-CM

## 2017-07-01 DIAGNOSIS — I10 Essential (primary) hypertension: Secondary | ICD-10-CM

## 2017-07-03 ENCOUNTER — Encounter: Admit: 2017-07-03 | Discharge: 2017-07-04 | Payer: MEDICARE

## 2017-07-03 DIAGNOSIS — J320 Chronic maxillary sinusitis: Secondary | ICD-10-CM

## 2017-07-03 DIAGNOSIS — F1020 Alcohol dependence, uncomplicated: Principal | ICD-10-CM

## 2017-07-03 DIAGNOSIS — F329 Major depressive disorder, single episode, unspecified: Secondary | ICD-10-CM

## 2017-07-03 DIAGNOSIS — K709 Alcoholic liver disease, unspecified: Principal | ICD-10-CM

## 2017-07-03 DIAGNOSIS — I10 Essential (primary) hypertension: Secondary | ICD-10-CM

## 2017-07-03 DIAGNOSIS — N950 Postmenopausal bleeding: Secondary | ICD-10-CM

## 2017-07-03 DIAGNOSIS — K7460 Unspecified cirrhosis of liver: Secondary | ICD-10-CM

## 2017-07-03 DIAGNOSIS — Z1239 Encounter for other screening for malignant neoplasm of breast: Secondary | ICD-10-CM

## 2017-07-03 DIAGNOSIS — G9340 Encephalopathy, unspecified: Secondary | ICD-10-CM

## 2017-07-03 DIAGNOSIS — E876 Hypokalemia: Secondary | ICD-10-CM

## 2017-07-03 DIAGNOSIS — K766 Portal hypertension: Secondary | ICD-10-CM

## 2017-07-03 DIAGNOSIS — I8500 Esophageal varices without bleeding: Secondary | ICD-10-CM

## 2017-07-03 DIAGNOSIS — D7589 Other specified diseases of blood and blood-forming organs: Secondary | ICD-10-CM

## 2017-07-03 DIAGNOSIS — B373 Candidiasis of vulva and vagina: Secondary | ICD-10-CM

## 2017-07-03 DIAGNOSIS — Z7982 Long term (current) use of aspirin: Secondary | ICD-10-CM

## 2017-07-03 MED ORDER — CARVEDILOL 3.125 MG TABLET: 3 mg | tablet | Freq: Two times a day (BID) | 11 refills | 0 days | Status: AC

## 2017-07-03 MED ORDER — ESCITALOPRAM 10 MG TABLET
ORAL_TABLET | 6 refills | 0 days | Status: CP
Start: 2017-07-03 — End: 2017-09-16

## 2017-07-03 MED ORDER — CARVEDILOL 3.125 MG TABLET
ORAL_TABLET | Freq: Two times a day (BID) | ORAL | 11 refills | 0.00000 days | Status: CP
Start: 2017-07-03 — End: 2018-05-24

## 2017-07-06 DIAGNOSIS — F1020 Alcohol dependence, uncomplicated: Secondary | ICD-10-CM

## 2017-07-06 DIAGNOSIS — J320 Chronic maxillary sinusitis: Secondary | ICD-10-CM

## 2017-07-06 DIAGNOSIS — I8500 Esophageal varices without bleeding: Secondary | ICD-10-CM

## 2017-07-06 DIAGNOSIS — Z7982 Long term (current) use of aspirin: Secondary | ICD-10-CM

## 2017-07-06 DIAGNOSIS — E876 Hypokalemia: Secondary | ICD-10-CM

## 2017-07-06 DIAGNOSIS — I10 Essential (primary) hypertension: Secondary | ICD-10-CM

## 2017-07-06 DIAGNOSIS — K766 Portal hypertension: Secondary | ICD-10-CM

## 2017-07-06 DIAGNOSIS — K709 Alcoholic liver disease, unspecified: Principal | ICD-10-CM

## 2017-07-06 DIAGNOSIS — B373 Candidiasis of vulva and vagina: Secondary | ICD-10-CM

## 2017-07-06 DIAGNOSIS — N950 Postmenopausal bleeding: Secondary | ICD-10-CM

## 2017-07-07 DIAGNOSIS — I8500 Esophageal varices without bleeding: Secondary | ICD-10-CM

## 2017-07-07 DIAGNOSIS — K709 Alcoholic liver disease, unspecified: Principal | ICD-10-CM

## 2017-07-07 DIAGNOSIS — J320 Chronic maxillary sinusitis: Secondary | ICD-10-CM

## 2017-07-07 DIAGNOSIS — F1020 Alcohol dependence, uncomplicated: Secondary | ICD-10-CM

## 2017-07-07 DIAGNOSIS — E876 Hypokalemia: Secondary | ICD-10-CM

## 2017-07-07 DIAGNOSIS — K766 Portal hypertension: Secondary | ICD-10-CM

## 2017-07-07 DIAGNOSIS — N950 Postmenopausal bleeding: Secondary | ICD-10-CM

## 2017-07-07 DIAGNOSIS — Z7982 Long term (current) use of aspirin: Secondary | ICD-10-CM

## 2017-07-07 DIAGNOSIS — B373 Candidiasis of vulva and vagina: Secondary | ICD-10-CM

## 2017-07-07 DIAGNOSIS — I10 Essential (primary) hypertension: Secondary | ICD-10-CM

## 2017-07-10 DIAGNOSIS — I8500 Esophageal varices without bleeding: Secondary | ICD-10-CM

## 2017-07-10 DIAGNOSIS — Z7982 Long term (current) use of aspirin: Secondary | ICD-10-CM

## 2017-07-10 DIAGNOSIS — B373 Candidiasis of vulva and vagina: Secondary | ICD-10-CM

## 2017-07-10 DIAGNOSIS — N950 Postmenopausal bleeding: Secondary | ICD-10-CM

## 2017-07-10 DIAGNOSIS — I10 Essential (primary) hypertension: Secondary | ICD-10-CM

## 2017-07-10 DIAGNOSIS — J320 Chronic maxillary sinusitis: Secondary | ICD-10-CM

## 2017-07-10 DIAGNOSIS — K709 Alcoholic liver disease, unspecified: Principal | ICD-10-CM

## 2017-07-10 DIAGNOSIS — K766 Portal hypertension: Secondary | ICD-10-CM

## 2017-07-10 DIAGNOSIS — F1020 Alcohol dependence, uncomplicated: Secondary | ICD-10-CM

## 2017-07-10 DIAGNOSIS — E876 Hypokalemia: Secondary | ICD-10-CM

## 2017-07-14 DIAGNOSIS — J320 Chronic maxillary sinusitis: Secondary | ICD-10-CM

## 2017-07-14 DIAGNOSIS — B373 Candidiasis of vulva and vagina: Secondary | ICD-10-CM

## 2017-07-14 DIAGNOSIS — Z7982 Long term (current) use of aspirin: Secondary | ICD-10-CM

## 2017-07-14 DIAGNOSIS — K709 Alcoholic liver disease, unspecified: Principal | ICD-10-CM

## 2017-07-14 DIAGNOSIS — E876 Hypokalemia: Secondary | ICD-10-CM

## 2017-07-14 DIAGNOSIS — N950 Postmenopausal bleeding: Secondary | ICD-10-CM

## 2017-07-14 DIAGNOSIS — K766 Portal hypertension: Secondary | ICD-10-CM

## 2017-07-14 DIAGNOSIS — I10 Essential (primary) hypertension: Secondary | ICD-10-CM

## 2017-07-14 DIAGNOSIS — I8500 Esophageal varices without bleeding: Secondary | ICD-10-CM

## 2017-07-14 DIAGNOSIS — F1020 Alcohol dependence, uncomplicated: Secondary | ICD-10-CM

## 2017-07-15 DIAGNOSIS — N950 Postmenopausal bleeding: Secondary | ICD-10-CM

## 2017-07-15 DIAGNOSIS — J320 Chronic maxillary sinusitis: Secondary | ICD-10-CM

## 2017-07-15 DIAGNOSIS — Z7982 Long term (current) use of aspirin: Secondary | ICD-10-CM

## 2017-07-15 DIAGNOSIS — B373 Candidiasis of vulva and vagina: Secondary | ICD-10-CM

## 2017-07-15 DIAGNOSIS — E876 Hypokalemia: Secondary | ICD-10-CM

## 2017-07-15 DIAGNOSIS — K709 Alcoholic liver disease, unspecified: Principal | ICD-10-CM

## 2017-07-15 DIAGNOSIS — F1020 Alcohol dependence, uncomplicated: Secondary | ICD-10-CM

## 2017-07-15 DIAGNOSIS — K766 Portal hypertension: Secondary | ICD-10-CM

## 2017-07-15 DIAGNOSIS — I8500 Esophageal varices without bleeding: Secondary | ICD-10-CM

## 2017-07-15 DIAGNOSIS — I10 Essential (primary) hypertension: Secondary | ICD-10-CM

## 2017-07-22 DIAGNOSIS — I8500 Esophageal varices without bleeding: Secondary | ICD-10-CM

## 2017-07-22 DIAGNOSIS — F1020 Alcohol dependence, uncomplicated: Secondary | ICD-10-CM

## 2017-07-22 DIAGNOSIS — B373 Candidiasis of vulva and vagina: Secondary | ICD-10-CM

## 2017-07-22 DIAGNOSIS — N950 Postmenopausal bleeding: Secondary | ICD-10-CM

## 2017-07-22 DIAGNOSIS — I10 Essential (primary) hypertension: Secondary | ICD-10-CM

## 2017-07-22 DIAGNOSIS — E876 Hypokalemia: Secondary | ICD-10-CM

## 2017-07-22 DIAGNOSIS — J320 Chronic maxillary sinusitis: Secondary | ICD-10-CM

## 2017-07-22 DIAGNOSIS — K709 Alcoholic liver disease, unspecified: Principal | ICD-10-CM

## 2017-07-22 DIAGNOSIS — K766 Portal hypertension: Secondary | ICD-10-CM

## 2017-07-22 DIAGNOSIS — Z7982 Long term (current) use of aspirin: Secondary | ICD-10-CM

## 2017-07-22 NOTE — Unmapped (Signed)
[name] is a 72 y.o. female  is here for   Chief Complaint   Patient presents with   ??? follow up       Assessment/Plan:    [name] was seen today for follow up.    Diagnoses and all orders for this visit:    Hypertension, unspecified type    Alcoholism (CMS-HCC)    Alcoholic liver disease (CMS-HCC)    Encephalopathy    Depression, unspecified depression type    Other orders  -     magnesium oxide (MAG-OX) 400 mg (241.3 mg magnesium) tablet; Take 1 tablet (400 mg total) by mouth Two (2) times a day.  -     folic acid (FOLVITE) 1 MG tablet; Take 1 tablet (1 mg total) by mouth daily.  -     thiamine HCl, vitamin B1, 250 MG tablet; Take 1 tablet (250 mg total) by mouth daily.  -     potassium chloride (KLOR-CON) 20 mEq packet; Take 20 mEq by mouth Two (2) times a day.  -     Hepatitis A vaccine adult IM  -     Hepatitis B vaccine adult IM        -HTN- BP under good control with present BB Rx.  She is advised to continue home blood pressure monitoring, low salt intake and walking exercise routine.  -History of alcoholism/alcoholic liver disease/hepatic encephalopathy: Patient is doing much better.  She has titrated lactulose dose to promote at least 3 loose BMs per day.  She is due to pick up newly prescribed Xifaxan and take as directed.  She will continue close follow-up with [facility].  She has been referred for follow-up endoscopy.  She is no longer drinking alcohol.  She will continue with PT sessions as directed.  We will start hep a and hep B immunization series today.  She is advised to hold on taking low-dose aspirin even though her recent platelet count was normal since she is due for repeat endoscopy and reassessment of esophageal varices.  -Depression symptoms: Much improved she is no longer feeling anxious and she is sleeping better.  She will continue with Lexapro, half of a 10 mg tablet a day.  Return in about 3 months (around 10/21/2017) for Recheck.    Subjective:     HPI  The pt is seen today for 3 week follow up visit.  She was seen then as a new pt and hospital follow up for the following:    Hepatic Encephalopathy  Alcoholism  Hepatic cirrhosis  Macrocytosis- in the setting of alcoholism  Depression- ongoing and contributing to alcoholism  Benign essential HTN  Portal hypertension   Episode of vaginal bleeding in the setting of candida genial skin infection  ??  The pt and her husband were advised as follows:  Continue carvedilol as prescribed and continue home BP  monitoring , 3 times a week, and record for my review  May increase lactulose dosing above three times a day to promote at least 3 BM's a day and  Improve mental status.  Re start OTC thiamine 250 mg a day,  Folic acid 1 mg a day  Start escitalopram  10 mg, 1/2 a tablet daily for depression  Continue home PT sessions as directed- this is going very well  Referrals fore screening mammogram and we will defer colonoscopy referral until pt is seen by liver clinic and cleared for this test.  Hold baby aspirin for the time being  due to recent nose bleeds and evidence of low platelet count on recent CBC  ??She is advised on alcohol abstinence.    Comprehensive lab work done 3 weeks ago revealed negative Ab's to Hep A, B and C.  She will need Hep A and B vaccination initiated.  Repeat CBC revealed normal WBC and platelet count and MCV remained elevated at 103 and Vit B 12 level was normal.  CMP result revealed mild elevated AST and Alk phos with normal repeat serum potassium level and normal serum creatinine  levels.  The pt was referred and seen by [facility] earlier this week and Xifaxin was added to her medication encephalopathy regimen.  Pt states she has never received GYN appt and she states her rash is gone and she is no longer having any bleeding issues.   She is feeling much better overall .  She is no longer drinking alcohol.  Her PT sessions at home are very helpful. Her home BP values are acceptable.  Her appetite is improving and her weight is stable.   She has increased her protein intake.  She has lab drawn yesterday and her MCV level is improved at 100 with normal WBC count/H/H and normal platelet count, GGT is mildly elevated at 55, PT is normal, and lft's remain stable.   She was referred for follow up EGD.  She is generally feeling less depressed/anxious and she is sleeping better.      ROS  Constitutional:  Denies  unexpected weight loss or gain, or weakness   Eyes:  Denies visual changes  Respiratory:  Denies cough or shortness of breath. No change in exercise  tolerance  Cardiovascular:  Denies chest pain, palpitations or lower extremity swelling   GI:  Denies abdominal pain, diarrhea, constipation   Musculoskeletal:  Denies myalgias  Skin:  Denies nonhealing lesions  Neurologic:  Denies headache, focal weakness or numbness, tingling  Endocrine:  Denies polyuria or polydypsia   Psychiatric:  Denies depression, anxiety      Outpatient Medications Prior to Visit   Medication Sig Dispense Refill   ??? carvedilol (COREG) 3.125 MG tablet Take 1 tablet (3.125 mg total) by mouth Two (2) times a day. 60 tablet 11   ??? escitalopram oxalate (LEXAPRO) 10 MG tablet Start with 1/2 a tablet daily 30 tablet 6   ??? lactulose (CONSTULOSE) 10 gram/15 mL solution Take 15 mL (10 g total) by mouth Three (3) times a day. Take enough to have at least 3 BMs per day 1892 mL 1   ??? rifAXIMin (XIFAXAN) 550 mg Tab Take 1 tablet (550 mg total) by mouth Two (2) times a day. 180 tablet 4   ??? aspirin (ECOTRIN) 81 MG tablet Take 81 mg by mouth daily.     ??? folic acid (FOLVITE) 1 MG tablet Take 1 tablet (1 mg total) by mouth daily. 30 tablet 1   ??? magnesium oxide (MAG-OX) 400 mg (241.3 mg magnesium) tablet Take 1 tablet (400 mg total) by mouth Two (2) times a day. 60 tablet 1   ??? potassium chloride (KLOR-CON) 20 mEq packet Take 40 mEq by mouth Two (2) times a day. (Patient taking differently: Take 20 mEq by mouth Two (2) times a day.) 120 packet 1   ??? thiamine HCl, vitamin B1, 250 MG tablet Take 1 tablet (250 mg total) by mouth daily. 30 tablet 1   ??? acetaminophen (TYLENOL) 325 MG tablet Take 2 tablets (650 mg total) by mouth every six (  6) hours as needed for pain. (Patient not taking: Reported on 06/09/2017)  0     No facility-administered medications prior to visit.          Objective:       Vital Signs  BP 130/88  - Pulse 75  - Ht 160 cm (5' 3)  - Wt 89.8 kg (198 lb)  - SpO2 98%  - BMI 35.07 kg/m??      Exam  General: normal appearance  EYES: Anicteric sclerae.  ENT: Oropharynx moist.  RESP: Relaxed respiratory effort. Clear to auscultation without wheezes or crackles.   CV: Regular rate and rhythm. Normal S1 and S2. No murmurs or gallops.  No lower extremity edema.   abd exam: non tender, no masses, no HSM   MSK: No focal muscle tenderness.  SKIN: Appropriately warm and moist.  NEURO: Stable gait and coordination.    Allergies:     Sulfa (sulfonamide antibiotics); Sulfur; and Aspirin    Current Medications:     Current Outpatient Prescriptions   Medication Sig Dispense Refill   ??? carvedilol (COREG) 3.125 MG tablet Take 1 tablet (3.125 mg total) by mouth Two (2) times a day. 60 tablet 11   ??? escitalopram oxalate (LEXAPRO) 10 MG tablet Start with 1/2 a tablet daily 30 tablet 6   ??? folic acid (FOLVITE) 1 MG tablet Take 1 tablet (1 mg total) by mouth daily. 30 tablet 5   ??? lactulose (CONSTULOSE) 10 gram/15 mL solution Take 15 mL (10 g total) by mouth Three (3) times a day. Take enough to have at least 3 BMs per day 1892 mL 1   ??? magnesium oxide (MAG-OX) 400 mg (241.3 mg magnesium) tablet Take 1 tablet (400 mg total) by mouth Two (2) times a day. 60 tablet 1   ??? potassium chloride (KLOR-CON) 20 mEq packet Take 20 mEq by mouth Two (2) times a day. 60 tablet 6   ??? rifAXIMin (XIFAXAN) 550 mg Tab Take 1 tablet (550 mg total) by mouth Two (2) times a day. 180 tablet 4   ??? thiamine HCl, vitamin B1, 250 MG tablet Take 1 tablet (250 mg total) by mouth daily. 30 tablet 6   ??? acetaminophen (TYLENOL) 325 MG tablet Take 2 tablets (650 mg total) by mouth every six (6) hours as needed for pain. (Patient not taking: Reported on 06/09/2017)  0     No current facility-administered medications for this visit.            Note - This record has been created using [employer]. Chart creation errors have been sought, but may not always have been located. Such creation errors do not reflect on the standard of medical care.    Jenell Milliner, MD

## 2017-07-23 ENCOUNTER — Encounter: Admit: 2017-07-23 | Discharge: 2017-07-24 | Payer: MEDICARE

## 2017-07-23 DIAGNOSIS — K7290 Hepatic failure, unspecified without coma: Secondary | ICD-10-CM

## 2017-07-23 DIAGNOSIS — K7030 Alcoholic cirrhosis of liver without ascites: Secondary | ICD-10-CM

## 2017-07-23 DIAGNOSIS — K709 Alcoholic liver disease, unspecified: Principal | ICD-10-CM

## 2017-07-23 LAB — PROTIME-INR: INR: 1.11

## 2017-07-23 LAB — COMPREHENSIVE METABOLIC PANEL
ALBUMIN: 3.6 g/dL (ref 3.5–5.0)
ALKALINE PHOSPHATASE: 138 U/L — ABNORMAL HIGH (ref 38–126)
ALT (SGPT): 29 U/L (ref 15–48)
ANION GAP: 11 mmol/L (ref 9–15)
AST (SGOT): 44 U/L — ABNORMAL HIGH (ref 14–38)
BILIRUBIN TOTAL: 1.7 mg/dL — ABNORMAL HIGH (ref 0.0–1.2)
BLOOD UREA NITROGEN: 6 mg/dL — ABNORMAL LOW (ref 7–21)
BUN / CREAT RATIO: 12
CHLORIDE: 102 mmol/L (ref 98–107)
CO2: 27 mmol/L (ref 22.0–30.0)
CREATININE: 0.5 mg/dL — ABNORMAL LOW (ref 0.60–1.00)
EGFR MDRD AF AMER: >=60 mL/min/{1.73_m2} (ref >=60)
EGFR MDRD NON AF AMER: >=60 mL/min/{1.73_m2} (ref >=60)
GLUCOSE RANDOM: 102 mg/dL — ABNORMAL HIGH (ref 65–99)
POTASSIUM: 3.8 mmol/L (ref 3.5–5.0)
PROTEIN TOTAL: 7 g/dL (ref 6.5–8.3)
SODIUM: 140 mmol/L (ref 135–145)

## 2017-07-23 LAB — CBC
HEMOGLOBIN: 14 g/dL (ref 12.0–16.0)
MEAN CORPUSCULAR HEMOGLOBIN CONC: 33 g/dL (ref 31.0–37.0)
MEAN CORPUSCULAR HEMOGLOBIN: 33.1 pg (ref 26.0–34.0)
MEAN CORPUSCULAR VOLUME: 100.4 fL — ABNORMAL HIGH (ref 80.0–100.0)
MEAN PLATELET VOLUME: 8.5 fL (ref 7.0–10.0)
PLATELET COUNT: 187 10*9/L (ref 150–440)
RED CELL DISTRIBUTION WIDTH: 14.6 % (ref 12.0–15.0)
WBC ADJUSTED: 6.7 10*9/L (ref 4.5–11.0)

## 2017-07-23 LAB — BLOOD UREA NITROGEN: Urea nitrogen:MCnc:Pt:Ser/Plas:Qn:: 6 — ABNORMAL LOW

## 2017-07-23 LAB — PLATELET COUNT: Lab: 187

## 2017-07-23 LAB — INR: Lab: 1.11

## 2017-07-23 LAB — GAMMA GLUTAMYL TRANSFERASE: Gamma glutamyl transferase:CCnc:Pt:Ser/Plas:Qn:: 55 — ABNORMAL HIGH

## 2017-07-23 MED ORDER — RIFAXIMIN 550 MG TABLET
ORAL_TABLET | Freq: Two times a day (BID) | ORAL | 4 refills | 0.00000 days | Status: CP
Start: 2017-07-23 — End: 2017-09-01

## 2017-07-23 MED ORDER — XIFAXAN 550 MG TABLET
ORAL_TABLET | 4 refills | 0 days
Start: 2017-07-23 — End: 2017-07-23

## 2017-07-23 NOTE — Unmapped (Signed)
[facility], [place]        Referring Provider:  Michaell Cowing, MD  [address]  [address]  [place], [place] [address]     Primary Care Provider:  [facility]          PATIENT PROFILE:        [name] is a 72 y.o. female (DOB: 1946/01/22) who is seen in consultation at the request of Dr. Charlean Sanfilippo for evaluation of alcoholic liver disease.          ASSESSMENT:      [name] is a 72yo female with liver disease from alcohol, she likely had cirrhosis, as she has evidence of portal hypertension.  She is committed to her sobriety and feels she has good family support.   Today we discussed cirrhosis care, the need for EGD and signs and symptoms of worsening disease.   She should continue her strengthen exercises and improved nutrition.   I do think she continues with some confusion related to PSE, also has poor quality of sleep. Adding Xifaxin to the lactulose may be very helpful.   Counseling could be helpful for her depression issues, as well.   MELD is low.         PLAN:       -This patient was seen and reviewed with Dr. Ruffin Frederick  -MELD labs today  -2G Na+, higher protein diet  -Add Xifaxin for PSE, titrate lactulose to 2-3 BMs per day  -Needs EGD for variceal screening  -HCC screening in July ([place] will be sufficient)   -Highly recommend 6 months of substance abuse counseling at [facility], she deferred today.   - She has no overt contraindication (despite age) for liver transplant if she were to worsen, however would need counseling. she deferred today.   -RTC in 2 months, sooner if needed.           CHIEF COMPLAINT: I'm here for my liver     HISTORY OF PRESENT ILLNESS: This is a 72 y.o. year old female with liver disease related to previous, long term alcohol use. She had a comprehensive liver evaluation (-HFE, Viral, AI)  a few years ago, but alcohol was not mentioned. She endorses drinking 1-2 bottle of wine per day for the last 14-15 years. She was admitted to [facility] in Jan with vomiting,  delirium and likely alcohol withdrawal. Treated for PSE with improvement. Blood work and imaging c/w cirrhosis.  Prior to her admission; she had been declining for months and had become physically very weak. Since d/c she has been receiving HH and home PT. She endorses improved balance and strength. She endorses eating better and following a low sodium diet. Patient denies complaints related to the liver.  Specifically, no ascites, lower extremity edema, gastrointestinal bleeding, puritus or confusion. In addition the patient denies chest pain, shortness of breath, fevers or weight loss.                 REVIEW OF SYSTEMS:     The balance of 12 systems reviewed is negative except as noted in the HPI.     PAST MEDICAL HISTORY:    Past Medical History:   Diagnosis Date   ??? Alcoholism (CMS-HCC)    ??? Alcoholism /alcohol abuse (CMS-HCC)    ??? Hypertension    ??? Liver disease        PAST SURGICAL HISTORY:    Past Surgical History:   Procedure Laterality Date   ???  btl         MEDICATIONS:      Current Outpatient Prescriptions:   ???  carvedilol (COREG) 3.125 MG tablet, Take 1 tablet (3.125 mg total) by mouth Two (2) times a day., Disp: 60 tablet, Rfl: 11  ???  escitalopram oxalate (LEXAPRO) 10 MG tablet, Start with 1/2 a tablet daily, Disp: 30 tablet, Rfl: 6  ???  folic acid (FOLVITE) 1 MG tablet, Take 1 tablet (1 mg total) by mouth daily., Disp: 30 tablet, Rfl: 1  ???  lactulose (CONSTULOSE) 10 gram/15 mL solution, Take 15 mL (10 g total) by mouth Three (3) times a day. Take enough to have at least 3 BMs per day, Disp: 1892 mL, Rfl: 1  ???  magnesium oxide (MAG-OX) 400 mg (241.3 mg magnesium) tablet, Take 1 tablet (400 mg total) by mouth Two (2) times a day., Disp: 60 tablet, Rfl: 1  ???  potassium chloride (KLOR-CON) 20 mEq packet, Take 40 mEq by mouth Two (2) times a day. (Patient taking differently: Take 20 mEq by mouth Two (2) times a day.), Disp: 120 packet, Rfl: 1  ???  thiamine HCl, vitamin B1, 250 MG tablet, Take 1 tablet (250 mg total) by mouth daily., Disp: 30 tablet, Rfl: 1  ???  acetaminophen (TYLENOL) 325 MG tablet, Take 2 tablets (650 mg total) by mouth every six (6) hours as needed for pain. (Patient not taking: Reported on 06/09/2017), Disp: , Rfl: 0  ???  aspirin (ECOTRIN) 81 MG tablet, Take 81 mg by mouth daily., Disp: , Rfl:   ???  rifAXIMin (XIFAXAN) 550 mg Tab, Take 1 tablet (550 mg total) by mouth Two (2) times a day., Disp: 180 tablet, Rfl: 4    ALLERGIES:    Sulfa (sulfonamide antibiotics) and Sulfur    SOCIAL HISTORY:    Social History     Social History   ??? Marital status: Married     Spouse name: N/A   ??? Number of children: N/A   ??? Years of education: N/A     Social History Main Topics   ??? Smoking status: Never Smoker   ??? Smokeless tobacco: Never Used   ??? Alcohol use Yes   ??? Drug use: Unknown   ??? Sexual activity: Not Asked     Other Topics Concern   ??? None     Social History Narrative   ??? None       FAMILY HISTORY:    family history includes Heart disease in her mother; Lupus in her brother.      VITAL SIGNS:    BP 168/74  - Pulse 85  - Temp 36.2 ??C (97.2 ??F) (Temporal)  - Wt 89.8 kg (198 lb)  - SpO2 97%  - BMI 35.07 kg/m??   Body mass index is 35.07 kg/m??.    PHYSICAL EXAM:    Normal comprehensive exam:      Constitutional:   Alert, oriented x 3, no acute distress, well nourished   Mental Status:   Thought organized, appropriate affect, normal fluent speech.   HEENT:   PEERL, conjunctiva clear, anicteric, oropharynx clear, neck supple, no LAD.   Respiratory: Clear to auscultation, and percussion to the bases, unlabored breathing.     Cardiac: Regular rate and rhythm normal S1 and S2, no murmur.      Abdomen: Soft, non-distended, non-tender, no organomegaly or masses.     Perianal/Rectal Exam Not performed.     Extremities:   No edema, well perfused.  Musculoskeletal: No joint swelling or tenderness noted, no deformities.     Skin: No rashes, jaundice or skin lesions noted.     Neuro: No focal deficits. Trace asterixis         DIAGNOSTIC STUDIES:  I have reviewed all pertinent diagnostic studies, including:    GI Procedures:    EGD ordered      Radiographic studies:  Ct Head Wo Contrast    Result Date: 06/03/2017  EXAM: Computed tomography, head or brain without contrast material. DATE: 06/03/2017 1:10 PM ACCESSION: [id number] UN DICTATED: 06/03/2017 1:17 PM [facility]PRETATION LOCATION: [facility]     CLINICAL INDICATION: 72 years old Female with DIZZINESS AND GIDDINESS--      COMPARISON: None     TECHNIQUE: Axial CT images of the head  from skull base to vertex without contrast.     FINDINGS: Focal hypoattenuation in the left cerebellar lobe compatible with remote infarct. There are scattered and confluent hypodense foci within the periventricular and deep white matter.  These are nonspecific but commonly associated with small vessel ischemic changes. There is no evidence of intracranial hemorrhage or acute infarct.  No fractures are evident.  The sinuses are pneumatized.          No acute infarct or hemorrhage detected.     Severe white matter disease favored to represent chronic ischemic changes.     Old left cerebellar infarct.    Mri Abdomen W Wo Contrast    Result Date: 06/05/2017  EXAM: MRI ABDOMEN W WO CONTRAST DATE: 06/05/2017 9:29 AM ACCESSION: [id number] UN DICTATED: 06/05/2017 9:55 AM INTERPRETATION LOCATION: [facility]     CLINICAL INDICATION: 72 years old Female with HEPATOMEGALY.  2 cm right hepatic lesion on ultrasound. Rule out [facility] with liver MRI.      COMPARISON: Correlation with abdominal ultrasound 06/05/2015     TECHNIQUE: MRI of the abdomen was obtained with and without IV contrast. Multisequence, multiplanar images were obtained.       FINDINGS:     LOWER CHEST: Small volume fluid along the distal esophagus. Trace atelectasis at the left lung base.     ABDOMEN:     HEPATOBILIARY: Hepatic steatosis. The liver is mildly nodular in contour with left hepatic hypertrophy. No focal liver lesion is identified to correspond with ultrasound findings. No arterial hyperenhancing or washout lesions. No biliary ductal dilatation. Gallbladder is surgically absent. PANCREAS: Unremarkable. SPLEEN: Enlarged measuring 15 cm in craniocaudal dimension. ADRENAL GLANDS: Unremarkable. KIDNEYS/URETERS: Small cysts in the right kidney. 1 cm hemorrhagic/proteinaceous cyst in the lower left kidney. No hydronephrosis. BOWEL/PERITONEUM/RETROPERITONEUM (imaged): Colonic diverticulosis. No bowel obstruction. No acute inflammatory process. Trace perihepatic and perisplenic ascites. VASCULATURE: Abdominal aorta within normal limits for patient's age. Recanalized umbilical vein. The hepatic veins are attenuated and not well visualized. The portal vein, splenic vein, and SMV are patent. Large caliber retroperitoneal varices, incompletely imaged. Small caliber paraesophageal and gastrohepatic varices. Unremarkable inferior vena cava. LYMPH NODES: Prominent 1 cm portacaval lymph node, likely reactive in the setting of chronic liver disease.     BONES/SOFT TISSUES: Degenerative changes in the spine. 1.4 cm circumscribed lesion in the right breast.          -- Cirrhosis with sequelae of portal hypertension, including splenomegaly, varices, and trace ascites. -- No evidence of HCC. Specifically, no evidence of focal liver lesion to correspond with ultrasound findings. -- 1.4 cm right breast lesion. Recommend correlation with recent mammography.     ______________________     LI-RADS 5 =  Definitely hepatocellular carcinoma (concordant with OPTN 5) LI-RADS 4 = Probably hepatocellular carcinoma LI-RADS 3 = Indeterminate LI-RADS 2 = Probably benign LI-RADS 1 = Definitely benign     NOTE:  The LI-RADS / OPTN classification of liver lesions has been adopted to standardize CT and MRI scan reporting in patients at risk for hepatocellular carcinoma. The imaging criteria for definite hepatocellular carcinoma are concordant for the LI-RADS and OPTN systems. LI-RADS criteria and documentation are available online at https://sanders.org/.  This report utilizes LI-RADS version 2018    US Abdomen Complete    Result Date: 06/04/2017  EXAM: US ABDOMEN COMPLETE DATE: 06/04/2017 5:20 AM ACCESSION: [id number] UN DICTATED: 06/04/2017 5:40 AM INTERPRETATION LOCATION: [facility]     CLINICAL INDICATION: 72 years old Female with liver disease--      TECHNIQUE: Static and cine images of the complete abdomen were performed.     COMPARISON: None     FINDINGS:     Suboptimal image quality due to patient body habitus and limited mobility.     LIVER: The liver is heterogeneous with increased echogenicity. Hyperechoic lesion in the right hepatic lobe measuring approximately 2.0 cm in maximum diameter. No intrahepatic biliary ductal dilatation. The common bile duct is normal in caliber.     GALLBLADDER: The gallbladder is contracted, limiting evaluation. Small echogenic foci with suggestion of posterior shadowing. Sonographic Eulah Pont sign is negative.  No pericholecystic fluid. No gallbladder wall thickening.     PANCREAS: Visualized portion is unremarkable.     SPLEEN: Mild splenomegaly.     KIDNEYS: Normal echotexture. Nonobstructive right upper pole renal stone measuring approximately 0.8 cm. No hydronephrosis.     VESSELS: Visualized proximal aorta and IVC are unremarkable.     OTHER: No ascites.              -Indeterminate 2 cm echogenic/calcified lesion in the right hepatic lobe, partially visualized. Recommend nonemergent abdominal MRI for further characterization. -Heterogeneous echogenic liver consistent with chronic liver disease and/or steatosis. -Suspected cholelithiasis without evidence of acute cholecystitis. -Nonobstructing right upper pole renal calculus, 8 mm. No hydronephrosis. -Mild splenomegaly.         Please see below for data measurements: Liver: 16.4 cm     Common hepatic duct: 0.3 cm Proximal common bile duct: 0.3 cm Distal common bile duct: 0.4 cm     Gallbladder wall: 0.3 mm, upper limits of normal     Right kidney: 12.0 cm Left kidney: 13.3 cm     Aorta: visualized Inferior vena cava: visualized     Spleen: 14 cm     Laboratory results:    Results for orders placed or performed in visit on 07/23/17   Comprehensive metabolic panel   Result Value Ref Range    Sodium 140 135 - 145 mmol/L    Potassium 3.8 3.5 - 5.0 mmol/L    Chloride 102 98 - 107 mmol/L    CO2 27.0 22.0 - 30.0 mmol/L    BUN 6 (L) 7 - 21 mg/dL    Creatinine 9.81 (L) 0.60 - 1.00 mg/dL    BUN/Creatinine Ratio 12     EGFR MDRD Non Af Amer >=60 >=60 mL/min/1.74m2    EGFR MDRD Af Amer >=60 >=60 mL/min/1.45m2    Anion Gap 11 9 - 15 mmol/L    Glucose 102 (H) 65 - 99 mg/dL    Calcium 9.2 8.5 - 19.1 mg/dL    Albumin 3.6 3.5 - 5.0 g/dL    Total Protein 7.0 6.5 -  8.3 g/dL    Total Bilirubin 1.7 (H) 0.0 - 1.2 mg/dL    AST 44 (H) 14 - 38 U/L    ALT 29 15 - 48 U/L    Alkaline Phosphatase 138 (H) 38 - 126 U/L   CBC   Result Value Ref Range    WBC 6.7 4.5 - 11.0 10*9/L    RBC 4.24 4.00 - 5.20 10*12/L    HGB 14.0 12.0 - 16.0 g/dL    HCT 29.5 28.4 - 13.2 %    MCV 100.4 (H) 80.0 - 100.0 fL    MCH 33.1 26.0 - 34.0 pg    MCHC 33.0 31.0 - 37.0 g/dL    RDW 44.0 10.2 - 72.5 %    MPV 8.5 7.0 - 10.0 fL    Platelet 187 150 - 440 10*9/L   PT-INR   Result Value Ref Range    PT 12.7 10.2 - 12.8 sec    INR 1.11    Gamma GT (GGT)   Result Value Ref Range    GGT 55 (H) 11 - 48 U/L     MELD-Na score: 10 at 07/23/2017 11:45 AM  MELD score: 10 at 07/23/2017 11:45 AM  Calculated from:  Serum Creatinine: 0.5 mg/dL (Rounded to 1) at 3/66/4403 11:45 AM  Serum Sodium: 140 mmol/L (Rounded to 137) at 07/23/2017 11:45 AM  Total Bilirubin: 1.7 mg/dL at 4/74/2595 63:87 AM  INR(ratio): 1.11 at 07/23/2017 11:45 AM  Age: 34 years

## 2017-07-23 NOTE — Unmapped (Signed)
New Medications: Xifaxin 550mg  1 tablet twice per day (this helps with ammonia build up/confusion/ fatigue/ poor sleep)                                  This works with the lactulose                                 Take enough lactulose to have 2-3 soft BMs per day, we don't want you to have too many and get dehydrated.     It is OK for you to take Zyrtec for allergies.       Upcoming tests: Upper Endoscopy to look for plump vessels in the esophagus.       We do want you to do counseling.       Please follow a 2000mg  sodium restricted diet, increase your protein intake. (have a yogurt at bedtime)       It is OK to take up to 2000mg  of acetaminophen (tylenol), please be cautious of combination products. Do not take ibuprofen, naproxen, aspirin, advil, aleve, motrin, Excedrin or headache powders. If you have questions about over-the-counter medications please contact me, Vallery Sa, NP [phone].      Please Return to clinic in 2 months   If you have not received a notice about a follow-up appointment please call the Liver Center at [phone]

## 2017-07-24 ENCOUNTER — Ambulatory Visit: Admit: 2017-07-24 | Discharge: 2017-07-25 | Payer: MEDICARE

## 2017-07-24 DIAGNOSIS — G9340 Encephalopathy, unspecified: Secondary | ICD-10-CM

## 2017-07-24 DIAGNOSIS — K709 Alcoholic liver disease, unspecified: Secondary | ICD-10-CM

## 2017-07-24 DIAGNOSIS — F1020 Alcohol dependence, uncomplicated: Secondary | ICD-10-CM

## 2017-07-24 DIAGNOSIS — I10 Essential (primary) hypertension: Principal | ICD-10-CM

## 2017-07-24 DIAGNOSIS — F329 Major depressive disorder, single episode, unspecified: Secondary | ICD-10-CM

## 2017-07-24 MED ORDER — THIAMINE HCL (VITAMIN B1) 250 MG TABLET
ORAL_TABLET | Freq: Every day | ORAL | 6 refills | 0 days | Status: CP
Start: 2017-07-24 — End: 2018-02-23

## 2017-07-24 MED ORDER — MAGNESIUM OXIDE 400 MG (241.3 MG MAGNESIUM) TABLET: 400 mg | tablet | Freq: Two times a day (BID) | 1 refills | 0 days | Status: AC

## 2017-07-24 MED ORDER — FOLIC ACID 1 MG TABLET
ORAL_TABLET | Freq: Every day | ORAL | 5 refills | 0.00000 days | Status: CP
Start: 2017-07-24 — End: 2017-07-24

## 2017-07-24 MED ORDER — FOLIC ACID 1 MG TABLET: 1 mg | tablet | Freq: Every day | 5 refills | 0 days | Status: AC

## 2017-07-24 MED ORDER — POTASSIUM CHLORIDE 20 MEQ ORAL PACKET
ORAL_TABLET | Freq: Two times a day (BID) | ORAL | 6 refills | 0.00000 days | Status: CP
Start: 2017-07-24 — End: 2017-07-24

## 2017-07-24 MED ORDER — POTASSIUM CHLORIDE 20 MEQ ORAL PACKET: 20 meq | tablet | Freq: Two times a day (BID) | 6 refills | 0 days | Status: AC

## 2017-07-24 MED ORDER — MAGNESIUM OXIDE 400 MG (241.3 MG MAGNESIUM) TABLET
ORAL_TABLET | Freq: Two times a day (BID) | ORAL | 1 refills | 0.00000 days | Status: CP
Start: 2017-07-24 — End: 2017-07-24

## 2017-07-24 NOTE — Unmapped (Signed)
Per test claim for [facility] at the [facility], patient needs Medication Assistance Program for Prior Authorization.

## 2017-07-29 DIAGNOSIS — B373 Candidiasis of vulva and vagina: Secondary | ICD-10-CM

## 2017-07-29 DIAGNOSIS — K709 Alcoholic liver disease, unspecified: Principal | ICD-10-CM

## 2017-07-29 DIAGNOSIS — K766 Portal hypertension: Secondary | ICD-10-CM

## 2017-07-29 DIAGNOSIS — I8500 Esophageal varices without bleeding: Secondary | ICD-10-CM

## 2017-07-29 DIAGNOSIS — E876 Hypokalemia: Secondary | ICD-10-CM

## 2017-07-29 DIAGNOSIS — Z7982 Long term (current) use of aspirin: Secondary | ICD-10-CM

## 2017-07-29 DIAGNOSIS — N950 Postmenopausal bleeding: Secondary | ICD-10-CM

## 2017-07-29 DIAGNOSIS — J320 Chronic maxillary sinusitis: Secondary | ICD-10-CM

## 2017-07-29 DIAGNOSIS — I10 Essential (primary) hypertension: Secondary | ICD-10-CM

## 2017-07-29 DIAGNOSIS — F1020 Alcohol dependence, uncomplicated: Secondary | ICD-10-CM

## 2017-08-14 ENCOUNTER — Ambulatory Visit: Admit: 2017-08-14 | Discharge: 2017-08-15 | Payer: MEDICARE

## 2017-08-14 ENCOUNTER — Encounter: Admit: 2017-08-14 | Discharge: 2017-08-15 | Payer: MEDICARE

## 2017-08-20 ENCOUNTER — Encounter: Admit: 2017-08-20 | Discharge: 2017-08-20 | Payer: MEDICARE

## 2017-08-20 DIAGNOSIS — Z1239 Encounter for other screening for malignant neoplasm of breast: Principal | ICD-10-CM

## 2017-08-26 ENCOUNTER — Institutional Professional Consult (permissible substitution): Admit: 2017-08-26 | Discharge: 2017-08-27 | Payer: MEDICARE

## 2017-08-26 MED ORDER — LACTULOSE 10 GRAM/15 ML ORAL SOLUTION
Freq: Three times a day (TID) | ORAL | 3 refills | 0 days | Status: CP
Start: 2017-08-26 — End: 2017-11-24

## 2017-08-26 NOTE — Unmapped (Signed)
[name] is a 72 y.o. female  is here for   Chief Complaint   Patient presents with   ??? Arthritis     meds?   ??? Anxiety     med discussion on increasing       Assessment/Plan:    [name] was seen today for arthritis and anxiety.    Diagnoses and all orders for this visit:    Hypertension, unspecified type    Alcoholic liver disease (CMS-HCC)  -     Hepatic Function Panel    Encephalopathy    Osteoarthritis, unspecified osteoarthritis type, unspecified site  -     diclofenac sodium (VOLTAREN) 1 % gel; Apply 2 g topically Four (4) times a day.    Seasonal allergic rhinitis due to pollen    Depression, unspecified depression type      Pt and her daughter advised as follows:  -For allergic rhinitis symptoms- start otc flonase nasal spray and allegra at 60 mg a day  -For joint/arthritis pain- Rx for Voltaren gel to use as directed and otc tylenol as needed only  -Schedule [place] [facility]/liver clinic and endoscopy appt  -Repeat liver panel today, titrate lactulose as directed to promote at least 3-4 loose BM's a day and improved mentation  -Continue home BP monitoring and low salt intake and current HTN med regimen  -Increase Citalopram to 10 mg daily and call me with progress.      Return in about 3 months (around 12/01/2017) for Recheck.    Subjective:     HPI- the pt was last seen 6 weeks ago with the following HPI:  Hypertension  History of Alcoholism  Alcoholic liver disease  Encephalopathy  Depression    On that visit the following were reviewed-    -HTN- BP under good control with present BB Rx.  She was advised to continue home blood pressure monitoring, low salt intake and walking exercise routine. She had normal kidney function lab results, 6 weeks.    -History of alcoholism/alcoholic liver disease/hepatic encephalopathy: Patient was doing well.  She has titrated lactulose dose to promote at least 3 loose BMs per day.  She has been prescribed Xifaxan but not covered by her insurance therefore never started.  She was to continue close follow-up with [facility] and she is in the process of scheduling this appt along with appt for recommended EGD.  She reported no alcohol consumption on her last visit.  Her mentation remains stable for the most part with 3-4 loose BM's a day.  She continues home PT exercises as directed. Hep A and hep B immunization series were administered.  She is advised to hold on taking low-dose aspirin even though her recent platelet count was normal since she is due for repeat endoscopy and reassessment of esophageal varices.  -Depression and anxiety symptoms: initially improved but her anxiety is increased in the past month.  She is presently on 1/2 of 10 mg of lexapro a day.   The pt is having seasonal allergy symptoms.  In addition, she has joint pain due to her arthritis. She wishes to discuss safe medications to take for these conditions in light of her liver disease history.      ROS  Constitutional:  History as noted above  Eyes:  Denies visual changes; HEENT history- reports allergy symptoms  Respiratory:  Denies cough or shortness of breath. No change in exercise  tolerance  Cardiovascular:  Denies chest pain, palpitations or lower extremity swelling  GI:  Denies abdominal pain, diarrhea, constipation   Musculoskeletal:  Denies myalgias- reports joint pain  Skin:  Denies nonhealing lesions  Neurologic:  Denies headache, focal weakness or numbness, tingling  Endocrine:  Denies polyuria or polydypsia   Psychiatric:  History as noted above      Outpatient Medications Prior to Visit   Medication Sig Dispense Refill   ??? acetaminophen (TYLENOL) 325 MG tablet Take 2 tablets (650 mg total) by mouth every six (6) hours as needed for pain.  0   ??? carvedilol (COREG) 3.125 MG tablet Take 1 tablet (3.125 mg total) by mouth Two (2) times a day. 60 tablet 11   ??? escitalopram oxalate (LEXAPRO) 10 MG tablet Start with 1/2 a tablet daily (Patient taking differently: 10 mg. Start with 1/2 a tablet daily) 30 tablet 6 ??? folic acid (FOLVITE) 1 MG tablet Take 1 tablet (1 mg total) by mouth daily. 30 tablet 5   ??? lactulose (CONSTULOSE) 10 gram/15 mL solution Take 15 mL (10 g total) by mouth Three (3) times a day. Take enough to have at least 3 BMs per day 1892 mL 3   ??? magnesium oxide (MAG-OX) 400 mg (241.3 mg magnesium) tablet Take 1 tablet (400 mg total) by mouth Two (2) times a day. 60 tablet 1   ??? potassium chloride (KLOR-CON) 20 mEq packet Take 20 mEq by mouth Two (2) times a day. (Patient taking differently: Take 20 mEq by mouth daily. ) 60 tablet 6   ??? thiamine HCl, vitamin B1, 250 MG tablet Take 1 tablet (250 mg total) by mouth daily. 30 tablet 6   ??? rifAXIMin (XIFAXAN) 550 mg Tab Take 1 tablet (550 mg total) by mouth Two (2) times a day. 180 tablet 4     No facility-administered medications prior to visit.          Objective:     40 min encounter, greater then 50 % counseling and coordination of care  Pt seen today with her daughter    Vital Signs  BP 130/62  - Pulse 71  - Ht 160 cm (5' 3)  - Wt 89.8 kg (198 lb)  - SpO2 98%  - BMI 35.07 kg/m??      Exam  General: normal appearance  EYES: Anicteric sclerae.  ENT: Oropharynx moist.  RESP: Relaxed respiratory effort. Clear to auscultation without wheezes or crackles.   CV: Regular rate and rhythm. Normal S1 and S2. No murmurs or gallops.  No lower extremity edema. Posterior tibial pulses are 2+ and symmetric.  abd exam: non tender, no masses, no HSM   MSK: No focal muscle tenderness.  SKIN: Appropriately warm and moist.  NEURO: Stable gait and coordination.    Allergies:     Sulfa (sulfonamide antibiotics); Sulfur; and Aspirin    Current Medications:     Current Outpatient Prescriptions   Medication Sig Dispense Refill   ??? acetaminophen (TYLENOL) 325 MG tablet Take 2 tablets (650 mg total) by mouth every six (6) hours as needed for pain.  0   ??? carvedilol (COREG) 3.125 MG tablet Take 1 tablet (3.125 mg total) by mouth Two (2) times a day. 60 tablet 11   ??? escitalopram oxalate (LEXAPRO) 10 MG tablet Start with 1/2 a tablet daily (Patient taking differently: 10 mg. Start with 1/2 a tablet daily) 30 tablet 6   ??? folic acid (FOLVITE) 1 MG tablet Take 1 tablet (1 mg total) by mouth daily. 30 tablet 5   ???  lactulose (CONSTULOSE) 10 gram/15 mL solution Take 15 mL (10 g total) by mouth Three (3) times a day. Take enough to have at least 3 BMs per day 1892 mL 3   ??? magnesium oxide (MAG-OX) 400 mg (241.3 mg magnesium) tablet Take 1 tablet (400 mg total) by mouth Two (2) times a day. 60 tablet 1   ??? potassium chloride (KLOR-CON) 20 mEq packet Take 20 mEq by mouth Two (2) times a day. (Patient taking differently: Take 20 mEq by mouth daily. ) 60 tablet 6   ??? thiamine HCl, vitamin B1, 250 MG tablet Take 1 tablet (250 mg total) by mouth daily. 30 tablet 6   ??? diclofenac sodium (VOLTAREN) 1 % gel Apply 2 g topically Four (4) times a day. 100 g 0     No current facility-administered medications for this visit.              Note - This record has been created using [employer]. Chart creation errors have been sought, but may not always have been located. Such creation errors do not reflect on the standard of medical care.    Jenell Milliner, MD

## 2017-09-01 ENCOUNTER — Encounter: Admit: 2017-09-01 | Discharge: 2017-09-02 | Payer: MEDICARE

## 2017-09-01 DIAGNOSIS — I10 Essential (primary) hypertension: Principal | ICD-10-CM

## 2017-09-01 DIAGNOSIS — F329 Major depressive disorder, single episode, unspecified: Secondary | ICD-10-CM

## 2017-09-01 DIAGNOSIS — M1990 Unspecified osteoarthritis, unspecified site: Secondary | ICD-10-CM

## 2017-09-01 DIAGNOSIS — G9340 Encephalopathy, unspecified: Secondary | ICD-10-CM

## 2017-09-01 DIAGNOSIS — J301 Allergic rhinitis due to pollen: Secondary | ICD-10-CM

## 2017-09-01 DIAGNOSIS — K709 Alcoholic liver disease, unspecified: Secondary | ICD-10-CM

## 2017-09-01 LAB — HEPATIC FUNCTION PANEL
ALBUMIN: 3.6 g/dL (ref 3.5–5.0)
ALKALINE PHOSPHATASE: 145 U/L — ABNORMAL HIGH (ref 38–126)
ALT (SGPT): 28 U/L (ref 15–48)
BILIRUBIN TOTAL: 1.6 mg/dL — ABNORMAL HIGH (ref 0.0–1.2)
PROTEIN TOTAL: 7.2 g/dL (ref 6.5–8.3)

## 2017-09-01 LAB — ALT (SGPT): Alanine aminotransferase:CCnc:Pt:Ser/Plas:Qn:: 28

## 2017-09-01 MED ORDER — DICLOFENAC 1 % TOPICAL GEL
Freq: Four times a day (QID) | TOPICAL | 0 refills | 0 days | Status: CP
Start: 2017-09-01 — End: 2018-09-01

## 2017-09-01 NOTE — Unmapped (Signed)
-  For allergic rhinitis symptoms- start otc flonase nasal spray and allegra at 60 mg a day  -For joint/arthritis pain- Rx for Voltaren gel to use as directed and otc tylenol as needed only  -Schedule  GI/liver clinic and endoscopy appt  -Repeat liver panel today, titrate lactulose as directed to promote at least 3-4 loose BM's a day and improved mentation  -Continue home BP monitoring and low salt intake and current HTN med regimen  -Increase Citalopram to 10 mg daily and call me with progress.

## 2017-09-01 NOTE — Unmapped (Signed)
PA done on Diclofenac Gel and approved through 06/01/18. Pt made aware

## 2017-09-02 NOTE — Unmapped (Signed)
Bilirubin level remains elevated but improved as well as alkaline phosphatase.  AST/ALT values are normal.

## 2017-09-16 NOTE — Unmapped (Signed)
Patient is out of her lexapro and is asking for a refill but the pharmacy said they cannot refill it until the end of April, patient has had to take it one per day and is asking if the dosage can be increased so she does not run out?

## 2017-09-16 NOTE — Unmapped (Signed)
Pt states she is taking 10 mg dily and is running out of meds too soon , can we change to 1 tablet daily.

## 2017-09-17 MED ORDER — ESCITALOPRAM 10 MG TABLET
ORAL_TABLET | Freq: Every day | ORAL | 1 refills | 0 days | Status: CP
Start: 2017-09-17 — End: 2017-11-30

## 2017-10-01 MED ORDER — MAGNESIUM OXIDE 400 MG (241.3 MG MAGNESIUM) TABLET
ORAL_TABLET | Freq: Two times a day (BID) | ORAL | 0 refills | 0 days | Status: CP
Start: 2017-10-01 — End: 2017-11-03

## 2017-10-01 NOTE — Unmapped (Signed)
Patient called stating her mom needs a refill on Mag-ox sent to [facility].  Please advise.

## 2017-10-07 ENCOUNTER — Encounter: Admit: 2017-10-07 | Discharge: 2017-10-07 | Payer: MEDICARE

## 2017-10-07 DIAGNOSIS — R928 Other abnormal and inconclusive findings on diagnostic imaging of breast: Principal | ICD-10-CM

## 2017-10-30 NOTE — Unmapped (Signed)
Patient's daughter called in stating that her mom has had nose bleeds this week and headaches. She is concerned wondering if it has to do with a medicine. Her number is [phone]

## 2017-10-30 NOTE — Unmapped (Signed)
Called , daughter states pt has had nosebleeds several times this week with some H/A's. Refused to go to Dr. Davina Poke pt to check pt's B/P to make sure it is WNL. Informed her of UC hours if pt has problems over week-end and educated on sxs to look for which would indicate an ER visit is needed

## 2017-11-03 MED ORDER — MAGNESIUM OXIDE 400 MG (241.3 MG MAGNESIUM) TABLET
ORAL_TABLET | Freq: Two times a day (BID) | ORAL | 1 refills | 0.00000 days | Status: CP
Start: 2017-11-03 — End: 2018-05-04

## 2017-11-24 MED ORDER — LACTULOSE 10 GRAM/15 ML ORAL SOLUTION
Freq: Three times a day (TID) | ORAL | 1 refills | 0 days | Status: CP
Start: 2017-11-24 — End: 2018-05-31

## 2017-11-26 NOTE — Unmapped (Addendum)
[name] is a 72 y.o. female  is here for   Chief Complaint   Patient presents with   ??? Hypertension     f/u   ??? Epistaxis       Assessment/Plan:    [name] was seen today for hypertension and epistaxis.    Diagnoses and all orders for this visit:    Epistaxis  -     CBC  -     POCT PT/INR  -     PT-INR    Depression, unspecified depression type  -     escitalopram oxalate (LEXAPRO) 10 MG tablet; 1-2 tablets daily as directed.    Benign essential HTN  -     PT-INR    Alcoholic liver disease (CMS-HCC)  -     CBC  -     POCT PT/INR  -     Comprehensive Metabolic Panel    Encephalopathy    Other orders  -     folic acid (FOLVITE) 1 MG tablet; Take 1 tablet (1 mg total) by mouth daily.    Pt and her daughter advised as follows:  HTN- good control on present BB regimen. She is advised on low salt intake and maintaining home BP monitoring  Alcoholic liver disease with encephalopathy, splenomegaly- stress importance of scheduling appt with [facility] liver clinic; will repeat liver enzymes today. Continue lactulose as previously directed.    Nose bleeds- check platelet count and PT INR today.  Nasal passages are inflamed in appearance- recommend polysporin ointment to inside nose before bed for 1 week.  For acute nose bleed, may use otc Afrin.  For depression symptoms- increase lexapro to 15 mg daily.          Return in about 6 weeks (around 01/11/2018) for Recheck.    Subjective:     HPI  The pt is seen today for 3 month follow up visit.  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   -Repeat liver panel revealed improved LFT's.  She was advised to titrate lactulose as directed to promote at least 3-4 loose BM's a day and improved mentation.  She is taking 3 doses a day and this is promoting at least 3-4 loose BM's a day with clearer mentation.  -Continue home BP monitoring and low salt intake and current HTN med regimen- she continues on Coreg as directed.   -Increase Citalopram to 10 mg daily and she was to call me with her progress.  She is still feeling sad on 10 mg dose. She does not want to get out much.   She is sleeping very well.  She is getting up frequently with increase urination with no dysuria. She has noted 2 nosebleeds.   She had normal platelet count in 07/2017.  She has an enlarged spleen on MRI in 06/2017.  ROS  Constitutional:  Denies  unexpected weight loss or gain, or weakness   Eyes:  Denies visual changes  Respiratory:  Denies cough or shortness of breath. No change in exercise  tolerance  Cardiovascular:  Denies chest pain, palpitations or lower extremity swelling   GI:  Denies abdominal pain, diarrhea, constipation   Musculoskeletal:  Denies myalgias  Skin:  Denies nonhealing lesions  Neurologic:  Denies headache, focal weakness or numbness, tingling  Endocrine:  Denies polyuria or polydypsia   Psychiatric:  History as noted above      Outpatient Medications Prior to Visit   Medication Sig Dispense Refill   ??? carvedilol (COREG) 3.125 MG tablet Take 1 tablet (3.125 mg total) by mouth Two (2) times a day. 60 tablet 11   ??? lactulose (CHRONULAC) 10 gram/15 mL solution Take 15 mL (10 g total) by mouth Three (3) times a day. Take enough to have 3 BM's per day 4050 mL 1   ??? magnesium oxide (MAG-OX) 400 mg (241.3 mg magnesium) tablet Take 1 tablet (400 mg total) by mouth Two (2) times a day. 180 tablet 1   ??? potassium chloride (KLOR-CON) 20 mEq packet Take 20 mEq by mouth Two (2) times a day. (Patient taking differently: Take 20 mEq by mouth daily. ) 60 tablet 6   ??? thiamine HCl, vitamin B1, 250 MG tablet Take 1 tablet (250 mg total) by mouth daily. 30 tablet 6   ??? escitalopram oxalate (LEXAPRO) 10 MG tablet Take 1 tablet (10 mg total) by mouth daily. 90 tablet 1   ??? folic acid (FOLVITE) 1 MG tablet Take 1 tablet (1 mg total) by mouth daily. 30 tablet 5   ??? acetaminophen (TYLENOL) 325 MG tablet Take 2 tablets (650 mg total) by mouth every six (6) hours as needed for pain. (Patient not taking: Reported on 11/30/2017)  0   ??? diclofenac sodium (VOLTAREN) 1 % gel Apply 2 g topically Four (4) times a day. (Patient not taking: Reported on 11/30/2017) 100 g 0     No facility-administered medications prior to visit.          Objective:       Vital Signs  BP 130/80  - Pulse 73  - Ht 160 cm (5' 2.99)  - Wt 93.9 kg (207 lb)  - SpO2 98%  - BMI 36.68 kg/m??      Exam  General: normal appearance  EYES: Anicteric sclerae.  ENT: Oropharynx moist. Nasal mucosa inflamed appearance with no active bleeding note.   RESP: Relaxed respiratory effort. Clear to auscultation without wheezes or crackles.   CV: Regular rate and rhythm. Normal S1 and S2. No murmurs or gallops.  No lower extremity edema. Posterior tibial pulses are 2+ and symmetric.  abd exam: non tender, no masses, no HSM   MSK: No focal muscle tenderness.  SKIN: Appropriately warm and moist.  NEURO: Stable gait and coordination.    Allergies:     Sulfa (sulfonamide antibiotics); Sulfur; and Aspirin    Current Medications:     Current Outpatient Medications   Medication Sig Dispense Refill   ??? carvedilol (COREG) 3.125 MG tablet Take 1 tablet (3.125 mg total) by mouth Two (2) times a day. 60 tablet 11   ??? escitalopram oxalate (LEXAPRO) 10 MG tablet 1-2 tablets daily as directed. 180 tablet 3   ??? folic acid (FOLVITE) 1 MG tablet Take 1 tablet (1 mg total) by mouth daily. 90 tablet 3   ??? lactulose (CHRONULAC)  10 gram/15 mL solution Take 15 mL (10 g total) by mouth Three (3) times a day. Take enough to have 3 BM's per day 4050 mL 1   ??? magnesium oxide (MAG-OX) 400 mg (241.3 mg magnesium) tablet Take 1 tablet (400 mg total) by mouth Two (2) times a day. 180 tablet 1   ??? potassium chloride (KLOR-CON) 20 mEq packet Take 20 mEq by mouth Two (2) times a day. (Patient taking differently: Take 20 mEq by mouth daily. ) 60 tablet 6   ??? thiamine HCl, vitamin B1, 250 MG tablet Take 1 tablet (250 mg total) by mouth daily. 30 tablet 6   ??? acetaminophen (TYLENOL) 325 MG tablet Take 2 tablets (650 mg total) by mouth every six (6) hours as needed for pain. (Patient not taking: Reported on 11/30/2017)  0   ??? diclofenac sodium (VOLTAREN) 1 % gel Apply 2 g topically Four (4) times a day. (Patient not taking: Reported on 11/30/2017) 100 g 0     No current facility-administered medications for this visit.              Note - This record has been created using [employer]. Chart creation errors have been sought, but may not always have been located. Such creation errors do not reflect on the standard of medical care.    Jenell Milliner, MD

## 2017-11-30 ENCOUNTER — Encounter: Admit: 2017-11-30 | Discharge: 2017-12-01 | Payer: MEDICARE

## 2017-11-30 DIAGNOSIS — G9340 Encephalopathy, unspecified: Secondary | ICD-10-CM | POA: Insufficient documentation

## 2017-11-30 DIAGNOSIS — F329 Major depressive disorder, single episode, unspecified: Principal | ICD-10-CM

## 2017-11-30 DIAGNOSIS — I10 Essential (primary) hypertension: Secondary | ICD-10-CM

## 2017-11-30 DIAGNOSIS — R040 Epistaxis: Secondary | ICD-10-CM

## 2017-11-30 DIAGNOSIS — K709 Alcoholic liver disease, unspecified: Secondary | ICD-10-CM

## 2017-11-30 LAB — CBC
HEMATOCRIT: 40.2 % (ref 36.0–46.0)
HEMOGLOBIN: 12.8 g/dL (ref 12.0–16.0)
MEAN CORPUSCULAR HEMOGLOBIN CONC: 31.9 g/dL (ref 31.0–37.0)
MEAN CORPUSCULAR HEMOGLOBIN: 31.4 pg (ref 26.0–34.0)
MEAN CORPUSCULAR VOLUME: 98.4 fL (ref 80.0–100.0)
MEAN PLATELET VOLUME: 9.4 fL (ref 7.0–10.0)
PLATELET COUNT: 221 10*9/L (ref 150–440)
RED BLOOD CELL COUNT: 4.09 10*12/L (ref 4.00–5.20)
WBC ADJUSTED: 8.3 10*9/L (ref 4.5–11.0)

## 2017-11-30 LAB — COMPREHENSIVE METABOLIC PANEL
ALBUMIN: 3.6 g/dL (ref 3.5–5.0)
ALKALINE PHOSPHATASE: 182 U/L — ABNORMAL HIGH (ref 38–126)
ALT (SGPT): 21 U/L (ref 15–48)
ANION GAP: 7 mmol/L — ABNORMAL LOW (ref 9–15)
AST (SGOT): 37 U/L (ref 14–38)
BILIRUBIN TOTAL: 1 mg/dL (ref 0.0–1.2)
BLOOD UREA NITROGEN: 12 mg/dL (ref 7–21)
BUN / CREAT RATIO: 32
CALCIUM: 9.5 mg/dL (ref 8.5–10.2)
CHLORIDE: 105 mmol/L (ref 98–107)
CO2: 25 mmol/L (ref 22.0–30.0)
CREATININE: 0.37 mg/dL — ABNORMAL LOW (ref 0.60–1.00)
EGFR CKD-EPI AA FEMALE: >90 mL/min/{1.73_m2} (ref >=60)
GLUCOSE RANDOM: 79 mg/dL (ref 65–179)
POTASSIUM: 4.6 mmol/L (ref 3.5–5.0)
SODIUM: 137 mmol/L (ref 135–145)

## 2017-11-30 LAB — MEAN CORPUSCULAR HEMOGLOBIN CONC: Lab: 31.9

## 2017-11-30 LAB — PROTEIN TOTAL: Protein:MCnc:Pt:Ser/Plas:Qn:: 7

## 2017-11-30 LAB — PROTIME: Lab: 13.2 — ABNORMAL HIGH

## 2017-11-30 MED ORDER — FOLIC ACID 1 MG TABLET
ORAL_TABLET | Freq: Every day | ORAL | 3 refills | 0 days | Status: CP
Start: 2017-11-30 — End: 2018-10-29

## 2017-11-30 MED ORDER — ESCITALOPRAM 10 MG TABLET
ORAL_TABLET | 3 refills | 0 days | Status: CP
Start: 2017-11-30 — End: 2018-01-11

## 2017-11-30 NOTE — Unmapped (Addendum)
HTN- good control on present BB regimen. She is advised on low salt intake and maintaining home BP monitoring  Alcoholic liver disease with encephalopathy, hypersplenism- stress importance of scheduling appt with [facility] liver clinic; will repeat liver enzymes today. Continue lactulose as previously directed.    Nose bleeds- check platelet count and PT INR today.  Nasal passages are inflamed in appearance- recommend polysporin ointment to inside nose before bed for 1 week.  For acute nose bleed, may use otc Afrin.  For depression symptoms- increase lexapro to 15 mg daily.

## 2017-12-01 NOTE — Unmapped (Signed)
Notify the pt that her clotting blood results are essentially normal, her lft's are normal except for chronically elevated alkaline phosphatase level.   Glucose , kidney function and complete blood count results are normal.  Stress importance of making appt to liver clinic as we discussed yesterday.

## 2017-12-11 NOTE — Unmapped (Signed)
Please check on referral for GYN entered 06/09/17.  They were not able to reach patient but patient also seen in the ED.  Referral might not be needed any longer.  Please close if no longer needed.

## 2018-01-07 NOTE — Unmapped (Signed)
[name] is a 72 y.o. female  is here for   Chief Complaint   Patient presents with   ??? Follow-up       Assessment/Plan:    [name] was seen today for follow-up.    Diagnoses and all orders for this visit:    Encephalopathy    Hypertension, unspecified type    Alcoholic liver disease (CMS-HCC)    Depression with anxiety       - depression and anxiety- she is still having primarily anxiety symptoms. She may increase lexapro to 20 mg daily  - alcoholic liver disease- she is scheduled for EGD on 9/4 but pt needs to schedule appt with [facility] Liver clinic. She will continue with current BB regimen and lactulose as directed.  - HTN- she  Remains normotensive on current medication regimen. She is advised on low salt intake, exercise and home BP monitoring.     Return in about 3 months (around 04/13/2018) for Annual physical- please provide the pt with [facility] GI/liver clinic office number.    Subjective:     HPI  The patient is seen today for 6-week follow-up visit.  She was seen then for the following HPI:  Epistaxis  Depression/GAD  Benign essential HTN  Alcoholic liver disease (CMS-HCC)  Encephalopathy- alcohol related      Pt and her daughter were advised then as follows:  HTN- good control on present BB regimen. She was advised on low salt intake and maintaining home BP monitoring  Alcoholic liver disease with encephalopathy, splenomegaly- stress importance of scheduling appt with [facility] liver clinic;repeat liver enzymes n 11/2017, were normal except for chronically elevated alkaline phosphatase level.  She was to continue lactulose as previously directed. She is having no acute MS changes. She has an EGDappt is on 02/03/2018.  Nose bleeds-screening platelet count and PT INR were normal.  Nasal passages are inflamed in appearance- recommend polysporin ointment to inside nose before bed for 1 week.  For acute nose bleed, may use otc Afrin. She has had no nose bleeds since her last visit.  For depression symptoms- increase lexapro to 15 mg daily; she is still having anxiety.   The pt had an episode of vaginal bleeding earlier this year.  Vaginal bleeding was noted to be on her underpants and a pad and was not from a urinary or GI source. A careful exam at that time showed a fungal infection of her groin and labia. She was referred to GYN at that time, but she has not been seen and she denies recurrence.     ROS  Constitutional:  Denies  unexpected weight loss or gain, or weakness   Eyes:  Denies visual changes  Respiratory:  Denies cough or shortness of breath. No change in exercise  tolerance  Cardiovascular:  Denies chest pain, palpitations or lower extremity swelling   GI:  Denies abdominal pain, diarrhea, constipation   Musculoskeletal:  Denies myalgias  Skin:  Denies nonhealing lesions  Neurologic:  Denies headache, focal weakness or numbness, tingling  Endocrine:  Denies polyuria or polydypsia   Psychiatric:  Denies depression, anxiety      Outpatient Medications Prior to Visit   Medication Sig Dispense Refill   ??? acetaminophen (TYLENOL) 325 MG tablet Take 2 tablets (650 mg total) by mouth every six (6) hours as needed for pain.  0   ??? carvedilol (COREG) 3.125 MG tablet Take 1 tablet (3.125 mg total) by mouth Two (2) times a day. 60 tablet  11   ??? folic acid (FOLVITE) 1 MG tablet Take 1 tablet (1 mg total) by mouth daily. 90 tablet 3   ??? lactulose (CHRONULAC) 10 gram/15 mL solution Take 15 mL (10 g total) by mouth Three (3) times a day. Take enough to have 3 BM's per day 4050 mL 1   ??? magnesium oxide (MAG-OX) 400 mg (241.3 mg magnesium) tablet Take 1 tablet (400 mg total) by mouth Two (2) times a day. 180 tablet 1   ??? potassium chloride (KLOR-CON) 20 mEq packet Take 20 mEq by mouth Two (2) times a day. (Patient taking differently: Take 20 mEq by mouth daily. ) 60 tablet 6   ??? thiamine HCl, vitamin B1, 250 MG tablet Take 1 tablet (250 mg total) by mouth daily. 30 tablet 6   ??? escitalopram oxalate (LEXAPRO) 10 MG tablet 1-2 tablets daily as directed. 180 tablet 3   ??? diclofenac sodium (VOLTAREN) 1 % gel Apply 2 g topically Four (4) times a day. (Patient not taking: Reported on 11/30/2017) 100 g 0     No facility-administered medications prior to visit.          Objective:       Vital Signs  BP 124/72  - Pulse 70  - Ht 160 cm (5' 3)  - Wt 93 kg (205 lb)  - SpO2 98%  - BMI 36.31 kg/m??      Exam  General: normal appearance  EYES: Anicteric sclerae.  ENT: Oropharynx moist.  RESP: Relaxed respiratory effort. Clear to auscultation without wheezes or crackles.   CV: Regular rate and rhythm. Normal S1 and S2. No murmurs or gallops.  No lower extremity edema. Posterior tibial pulses are 2+ and symmetric.  abd exam: non tender, no masses, no HSM   MSK: No focal muscle tenderness.  SKIN: Appropriately warm and moist.  NEURO: Stable gait and coordination.    Allergies:     Sulfa (sulfonamide antibiotics); Sulfur; and Aspirin    Current Medications:     Current Outpatient Medications   Medication Sig Dispense Refill   ??? acetaminophen (TYLENOL) 325 MG tablet Take 2 tablets (650 mg total) by mouth every six (6) hours as needed for pain.  0   ??? carvedilol (COREG) 3.125 MG tablet Take 1 tablet (3.125 mg total) by mouth Two (2) times a day. 60 tablet 11   ??? folic acid (FOLVITE) 1 MG tablet Take 1 tablet (1 mg total) by mouth daily. 90 tablet 3   ??? lactulose (CHRONULAC) 10 gram/15 mL solution Take 15 mL (10 g total) by mouth Three (3) times a day. Take enough to have 3 BM's per day 4050 mL 1   ??? magnesium oxide (MAG-OX) 400 mg (241.3 mg magnesium) tablet Take 1 tablet (400 mg total) by mouth Two (2) times a day. 180 tablet 1   ??? potassium chloride (KLOR-CON) 20 mEq packet Take 20 mEq by mouth Two (2) times a day. (Patient taking differently: Take 20 mEq by mouth daily. ) 60 tablet 6   ??? thiamine HCl, vitamin B1, 250 MG tablet Take 1 tablet (250 mg total) by mouth daily. 30 tablet 6   ??? diclofenac sodium (VOLTAREN) 1 % gel Apply 2 g topically Four (4) times a day. (Patient not taking: Reported on 11/30/2017) 100 g 0     No current facility-administered medications for this visit.              Note - This record has been created  using [employer]. Chart creation errors have been sought, but may not always have been located. Such creation errors do not reflect on the standard of medical care.    Jenell Milliner, MD

## 2018-01-11 ENCOUNTER — Encounter: Admit: 2018-01-11 | Discharge: 2018-01-12 | Payer: MEDICARE

## 2018-01-11 DIAGNOSIS — F418 Other specified anxiety disorders: Secondary | ICD-10-CM | POA: Insufficient documentation

## 2018-01-11 DIAGNOSIS — I10 Essential (primary) hypertension: Secondary | ICD-10-CM

## 2018-01-11 DIAGNOSIS — G9340 Encephalopathy, unspecified: Principal | ICD-10-CM

## 2018-01-11 DIAGNOSIS — K709 Alcoholic liver disease, unspecified: Secondary | ICD-10-CM

## 2018-01-11 MED ORDER — ESCITALOPRAM 20 MG TABLET
ORAL_TABLET | Freq: Every day | ORAL | 2 refills | 0.00000 days | Status: CP
Start: 2018-01-11 — End: 2019-01-11

## 2018-01-27 ENCOUNTER — Institutional Professional Consult (permissible substitution): Admit: 2018-01-27 | Discharge: 2018-01-28 | Payer: MEDICARE

## 2018-01-27 DIAGNOSIS — Z23 Encounter for immunization: Principal | ICD-10-CM

## 2018-01-27 NOTE — Unmapped (Signed)
Hep A and Hep B vaccine given

## 2018-02-02 MED ORDER — POTASSIUM CHLORIDE ER 20 MEQ TABLET,EXTENDED RELEASE(PART/CRYST)
ORAL_TABLET | Freq: Two times a day (BID) | ORAL | 1 refills | 0.00000 days | Status: CP
Start: 2018-02-02 — End: 2018-08-19

## 2018-02-03 ENCOUNTER — Encounter: Admit: 2018-02-03 | Discharge: 2018-02-03 | Payer: MEDICARE

## 2018-02-03 ENCOUNTER — Encounter: Admit: 2018-02-03 | Discharge: 2018-02-03 | Payer: MEDICARE | Attending: Anesthesiology | Primary: Anesthesiology

## 2018-02-03 NOTE — Unmapped (Signed)
See Provation note in Procedures tab and full report with images in Media tab labeled Operative Procedure Reports

## 2018-02-24 MED ORDER — THIAMINE MONONITRATE (VITAMIN B1) 100 MG TABLET
ORAL_TABLET | Freq: Every day | ORAL | 1 refills | 0 days | Status: CP
Start: 2018-02-24 — End: 2018-08-23

## 2018-04-08 ENCOUNTER — Ambulatory Visit: Admit: 2018-04-08 | Discharge: 2018-04-09 | Payer: MEDICARE

## 2018-04-08 DIAGNOSIS — K7030 Alcoholic cirrhosis of liver without ascites: Principal | ICD-10-CM

## 2018-04-08 LAB — CBC
HEMATOCRIT: 46.5 % — ABNORMAL HIGH (ref 36.0–46.0)
HEMOGLOBIN: 15 g/dL (ref 12.0–16.0)
MEAN CORPUSCULAR HEMOGLOBIN CONC: 32.2 g/dL (ref 31.0–37.0)
MEAN CORPUSCULAR HEMOGLOBIN: 31.6 pg (ref 26.0–34.0)
MEAN CORPUSCULAR VOLUME: 98.1 fL (ref 80.0–100.0)
MEAN PLATELET VOLUME: 7.9 fL (ref 7.0–10.0)
PLATELET COUNT: 243 10*9/L (ref 150–440)
RED BLOOD CELL COUNT: 4.74 10*12/L (ref 4.00–5.20)
RED CELL DISTRIBUTION WIDTH: 14.4 % (ref 12.0–15.0)
WBC ADJUSTED: 7.2 10*9/L (ref 4.5–11.0)

## 2018-04-08 LAB — COMPREHENSIVE METABOLIC PANEL
ALBUMIN: 3.7 g/dL (ref 3.5–5.0)
ALT (SGPT): 15 U/L (ref <35)
ANION GAP: 9 mmol/L (ref 7–15)
AST (SGOT): 28 U/L (ref 14–38)
BILIRUBIN TOTAL: 2.1 mg/dL — ABNORMAL HIGH (ref 0.0–1.2)
BLOOD UREA NITROGEN: 8 mg/dL (ref 7–21)
BUN / CREAT RATIO: 17
CALCIUM: 9.5 mg/dL (ref 8.5–10.2)
CHLORIDE: 105 mmol/L (ref 98–107)
CO2: 24 mmol/L (ref 22.0–30.0)
CREATININE: 0.47 mg/dL — ABNORMAL LOW (ref 0.60–1.00)
EGFR CKD-EPI AA FEMALE: >90 mL/min/{1.73_m2} (ref >=60)
EGFR CKD-EPI NON-AA FEMALE: >90 mL/min/{1.73_m2} (ref >=60)
GLUCOSE RANDOM: 101 mg/dL — ABNORMAL HIGH (ref 65–99)
POTASSIUM: 4.6 mmol/L (ref 3.5–5.0)
PROTEIN TOTAL: 7.2 g/dL (ref 6.5–8.3)
SODIUM: 138 mmol/L (ref 135–145)

## 2018-04-08 LAB — GAMMA GLUTAMYL TRANSFERASE: Gamma glutamyl transferase:CCnc:Pt:Ser/Plas:Qn:: 31

## 2018-04-08 LAB — EGFR CKD-EPI AA FEMALE: Lab: >90

## 2018-04-08 LAB — PROTIME: Lab: 12.9

## 2018-04-08 LAB — MEAN CORPUSCULAR VOLUME: Lab: 98.1

## 2018-04-08 LAB — BILIRUBIN DIRECT: Bilirubin.glucuronidated:MCnc:Pt:Ser/Plas:Qn:: 0.4

## 2018-04-08 NOTE — Unmapped (Signed)
[facility], [place]        Referring Provider:  Michaell Cowing, MD  [address]  [address]  [place], [place] [address]     Primary Care Provider:  Jenell Milliner, MD          PATIENT PROFILE:        [name] is a 72 y.o. female (DOB: 02/24/1946) who is seen for cirrhosis from ASH/NASH       ASSESSMENT:      [name] is a 72 yo female with liver disease from ASH/NASH.   She is committed to her sobriety and feels she has good family support.   She should continue her strengthen exercises and improved nutrition.   MELD is low.         PLAN:       -This patient was seen and reviewed with Dr. Ruffin Frederick  -MELD labs today  -2G Na+, higher protein diet  - titrate lactulose to 2-3 BMs per day  -HCC screening due, ordered  -RTC in 6 months, sooner if needed.           CHIEF COMPLAINT: I'm here for my liver     HISTORY OF PRESENT ILLNESS: This is a 72 y.o. year old female with liver disease related to previous, long term alcohol use. She had a comprehensive liver evaluation (-HFE, Viral, AI)  a few years ago, but alcohol was not mentioned. She endorses drinking 1-2 bottle of wine per day for the last 14-15 years. She was admitted to [facility] in Jan with vomiting,  delirium and likely alcohol withdrawal. Treated for PSE with improvement. Blood work and imaging c/w cirrhosis. Recent EGD showed Gr 1 EV and PHG.   Prior to her admission; she had been declining for months and had become physically very weak.  She endorses improved balance and strength; but husband says she doesn't move from the couch. She endorses eating better and following a low sodium diet. Patient denies complaints related to the liver.  Specifically, no ascites, lower extremity edema, gastrointestinal bleeding, puritus or confusion. In addition the patient denies chest pain, shortness of breath, fevers or weight loss.                 REVIEW OF SYSTEMS:     The balance of 12 systems reviewed is negative except as noted in the HPI.     PAST MEDICAL HISTORY:    Past Medical History:   Diagnosis Date   ??? Alcoholism (CMS-HCC)    ??? Alcoholism /alcohol abuse (CMS-HCC)    ??? Cirrhosis (CMS-HCC)    ??? Depression    ??? Hypertension    ??? Liver disease        PAST SURGICAL HISTORY:    Past Surgical History:   Procedure Laterality Date   ??? BREAST BIOPSY Right     benign-a long time ago   ??? btl     ??? CHOLECYSTECTOMY     ??? PR UPPER GI ENDOSCOPY,BIOPSY N/A 02/03/2018    Procedure: UGI ENDOSCOPY; WITH BIOPSY, SINGLE OR MULTIPLE;  Surgeon: Alfred Levins, MD;  Location: [facility] PROCEDURES [facility];  Service: Gastroenterology       MEDICATIONS:      Current Outpatient Medications:   ???  carvedilol (COREG) 3.125 MG tablet, Take 1 tablet (3.125 mg total) by mouth Two (2) times a day., Disp: 60 tablet, Rfl: 11  ???  cyanocobalamin 1000 MCG tablet, Take 1,000 mcg by mouth  daily., Disp: , Rfl:   ???  diclofenac sodium (VOLTAREN) 1 % gel, Apply 2 g topically Four (4) times a day., Disp: 100 g, Rfl: 0  ???  escitalopram oxalate (LEXAPRO) 20 MG tablet, Take 1 tablet (20 mg total) by mouth daily., Disp: 30 tablet, Rfl: 2  ???  folic acid (FOLVITE) 1 MG tablet, Take 1 tablet (1 mg total) by mouth daily., Disp: 90 tablet, Rfl: 3  ???  lactulose (CHRONULAC) 10 gram/15 mL solution, Take 15 mL (10 g total) by mouth Three (3) times a day. Take enough to have 3 BM's per day, Disp: 4050 mL, Rfl: 1  ???  magnesium oxide (MAG-OX) 400 mg (241.3 mg magnesium) tablet, Take 1 tablet (400 mg total) by mouth Two (2) times a day., Disp: 180 tablet, Rfl: 1  ???  oxymetazoline (AFRIN) 0.05 % nasal spray, 2 sprays Two (2) times a day., Disp: , Rfl:   ???  potassium chloride SA (K-DUR,KLOR-CON) 20 MEQ tablet, Take 1 tablet (20 mEq total) by mouth Two (2) times a day., Disp: 180 tablet, Rfl: 1  ???  thiamine mononitrate, vit B1, 100 mg Tab tablet, Take 2.5 tablets (250 mg total) by mouth daily., Disp: 225 tablet, Rfl: 1  ???  acetaminophen (TYLENOL) 325 MG tablet, Take 2 tablets (650 mg total) by mouth every six (6) hours as needed for pain. (Patient not taking: Reported on 04/08/2018), Disp: , Rfl: 0  ???  potassium chloride (KLOR-CON) 20 mEq packet, Take 20 mEq by mouth Two (2) times a day. (Patient not taking: Reported on 04/08/2018), Disp: 60 tablet, Rfl: 6    ALLERGIES:    Sulfa (sulfonamide antibiotics); Sulfur; and Aspirin    SOCIAL HISTORY:    Social History     Socioeconomic History   ??? Marital status: Married     Spouse name: None   ??? Number of children: None   ??? Years of education: None   ??? Highest education level: None   Occupational History   ??? None   Social Needs   ??? Financial resource strain: None   ??? Food insecurity:     Worry: None     Inability: None   ??? Transportation needs:     Medical: None     Non-medical: None   Tobacco Use   ??? Smoking status: Never Smoker   ??? Smokeless tobacco: Never Used   Substance and Sexual Activity   ??? Alcohol use: Not Currently   ??? Drug use: Never   ??? Sexual activity: None   Lifestyle   ??? Physical activity:     Days per week: None     Minutes per session: None   ??? Stress: None   Relationships   ??? Social connections:     Talks on phone: None     Gets together: None     Attends religious service: None     Active member of club or organization: None     Attends meetings of clubs or organizations: None     Relationship status: None   Other Topics Concern   ??? None   Social History Narrative   ??? None       FAMILY HISTORY:    family history includes Heart disease in her mother; Lupus in her brother; No Known Problems in her daughter, father, maternal grandfather, maternal grandmother, paternal grandfather, paternal grandmother, and sister.      VITAL SIGNS:    Temp 37.1 ??C (98.7 ??F) (Temporal)  -  Wt 98.1 kg (216 lb 4.8 oz)  - SpO2 96% Comment: room air - BMI 38.32 kg/m??   Body mass index is 38.32 kg/m??.    PHYSICAL EXAM:    Normal comprehensive exam:      Constitutional:   Alert, oriented x 3, no acute distress, well nourished   Mental Status:   Thought organized, appropriate affect, normal fluent speech.   HEENT:   PEERL, conjunctiva clear, anicteric, oropharynx clear, neck supple, no LAD.   Respiratory: Clear to auscultation, and percussion to the bases, unlabored breathing.     Cardiac: Regular rate and rhythm normal S1 and S2, no murmur.      Abdomen: Soft, non-distended, non-tender, no organomegaly or masses.     Perianal/Rectal Exam Not performed.     Extremities:   No edema, well perfused.   Musculoskeletal: No joint swelling or tenderness noted, no deformities.     Skin: No rashes, jaundice or skin lesions noted.     Neuro: No focal deficits. Trace asterixis         DIAGNOSTIC STUDIES:  I have reviewed all pertinent diagnostic studies, including:    GI Procedures:    EGD 02/03/18  Findings:       Grade I varices were found in the lower third of the esophagus. 2        columns.       A widely patent Schatzki ring was found at the gastroesophageal junction.       A 2 cm hiatal hernia was present.       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       Retained fluid was found in the gastric body. Clear fluid with        particulate matter.       There is no endoscopic evidence of varices in the entire examined        stomach.       No gross lesions were noted in the entire examined duodenum.                                                                                   Impression:        - Grade I esophageal varices.                     - Widely patent Schatzki ring.                     - 2 cm hiatal hernia.                     - Portal hypertensive gastropathy.                     - Retained gastric fluid.                     - No gross lesions in the entire examined duodenum.                     - No specimens collected.      Radiographic studies:  Reviewed     Laboratory results:  Results for orders placed or performed in visit on 04/08/18   Gamma GT (GGT)   Result Value Ref Range    GGT 31 11 - 48 U/L   PT-INR   Result Value Ref Range    PT 12.9 10.2 - 13.1 sec    INR 1.12    Comprehensive metabolic panel   Result Value Ref Range    Sodium 138 135 - 145 mmol/L    Potassium 4.6 3.5 - 5.0 mmol/L    Chloride 105 98 - 107 mmol/L    CO2 24.0 22.0 - 30.0 mmol/L    BUN 8 7 - 21 mg/dL    Creatinine 1.61 (L) 0.60 - 1.00 mg/dL    BUN/Creatinine Ratio 17     EGFR CKD-EPI Non-African American, Female >90 >=60 mL/min/1.84m2    EGFR CKD-EPI African American, Female >90 >=60 mL/min/1.60m2    Glucose 101 (H) 65 - 99 mg/dL    Calcium 9.5 8.5 - 09.6 mg/dL    Albumin 3.7 3.5 - 5.0 g/dL    Total Protein 7.2 6.5 - 8.3 g/dL    Total Bilirubin 2.1 (H) 0.0 - 1.2 mg/dL    AST 28 14 - 38 U/L    ALT 15 <35 U/L    Alkaline Phosphatase 162 (H) 38 - 126 U/L    Anion Gap 9 7 - 15 mmol/L     MELD-Na score: 10 at 04/08/2018  9:04 AM  MELD score: 10 at 04/08/2018  9:04 AM  Calculated from:  Serum Creatinine: 0.47 mg/dL (Rounded to 1 mg/dL) at 09/04/4096  1:19 AM  Serum Sodium: 138 mmol/L (Rounded to 137 mmol/L) at 04/08/2018  9:04 AM  Total Bilirubin: 2.1 mg/dL at 14/11/8293  6:21 AM  INR(ratio): 1.12 at 04/08/2018  9:04 AM  Age: 53 years

## 2018-04-08 NOTE — Unmapped (Signed)
Patient given flu vaccine. Flu vaccine VIS given to patient. Patient tolerated procedure well.

## 2018-04-08 NOTE — Unmapped (Signed)
Upcoming Tests:  [place]      You have to get moving!! Walking and working or your balance and upper body strength.       Please follow a 2000mg  sodium restricted diet  Increase your protein intake by eating eggs, chicken, fish, yogurt and/or supplements.       It is OK to take up to 2000mg  of acetaminophen (tylenol), please be cautious of combination products. Do not take ibuprofen, naproxen, aspirin, advil, aleve, motrin, Excedrin or headache powders. Avoid herbal or natural supplements.  If you have questions about over-the-counter medications please contact our nurse, Jearld Lesch at [phone].     Please Return to clinic in 6 months  If you have not received a notice about a follow-up appointment please call the Liver Center at [phone]

## 2018-04-12 NOTE — Unmapped (Signed)
Name:  [name]  DOB: December 26, 1945  Today's Date: 04/12/2018  Age:  72 y.o.    Assessment/Plan:      Personalized Prevention Plan: A personalized prevention plan was reviewed with the patient and a written copy was provided for personal records (available for review in Patient Instructions).    Risks identified: history of alcoholism and she has not had alcohol for over a year.  The pt has not fallen.  Her depression symptoms are controlled for the most part on her current medication regimen.      The following preventive services were discussed with the patient: See Health Maintenance recommendations listed below    Other Exam:    Annual lab work: check non fasting lipid panel today given her history of HTN.  TSH was normal in 07/2017, CBC and CMP were done last week (pt has chronically elevated Alk phos and bilirubin levels).  Referrals: none needed  Vaccines: up to date, printed Rx for ShingRix to receive at local pharmacy  Follow up visit: 4 months and administer prevnar vaccine then      Barriers to goals identified and addressed. None identified.     Provider to discuss treatment options and their associated risks/benefits with the patient at their next appointment. Provider to discuss preventive services with the patient. Refer to Provider documentation.    Subjective/Objective:      Patient ID: [name] is a 72 y.o. female who presents for an Annual Wellness Visit .    HPI- I last saw the pt in 12/2017 for routine follow up visit. 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 She may increase lexapro to 20 mg daily  - alcoholic liver disease- she is scheduled for EGD on 9/4 but pt needs to schedule appt with [facility]. She will continue with current BB regimen and lactulose as directed.  - HTN- she  Remains normotensive on current medication regimen. She is advised on low salt intake, exercise and home BP monitoring    The patient was seen by GI clinic, last week and the following encounter impression is noted:    Assessment   ASSESSMENT:                                               [name] is a 72 yo female with liver disease from ASH/NASH.   She is committed to her sobriety and feels she has good family support.   She should continue her strengthen exercises and improved nutrition.   MELD is low.   Plan   PLAN:                                                              -This patient was seen and reviewed with Dr. Ruffin Frederick  -MELD labs today  -2G Na+, higher protein diet  - titrate lactulose to 2-3 BMs per day  -HCC screening due, ordered  -  RTC in 6 months, sooner if needed.   She is scheduled this Friday for ultrasound.    The patient came to the appointment with her husband.    Medications, allergies, medical history, surgical history, family history,  social history, and previous hospitalization history were reviewed and updated in EMR today.    Vitals and BMI were reviewed.     Preventative health screening tests and immunization records were reviewed.  Appropriate age related screening tests were ordered today if patient is due and agreeable to have tests completed.  See assessment/plan.    Current Providers:     Patient Care Team:  Jenell Milliner, MD as PCP - General  Jenell Milliner, MD as PCP - [facility]    Health Risk Assessment (HRA):     Balance and gait assessment:  Timed Get up an Go to perform walk a distance of 10 ft.: 8seconds. Her gait is tentative and she has bilateral knee pain due to DJD. Results discussed with patient. If abnormal, provider notified.      Normative Reference Values by Age   Time in Seconds    2 ??? 69 years  8.1 (7.1 ??? 9.0)    70 ??? 79 years  9.2 (8.2 ??? 10.2)    80 ??? 99 years  11.3 (10.0 ??? 12.7) Cognitive Assessment:    Mini-Cog score: 3 words recalled, with normal clock drawing    Health Literacy: How confident are you that you understand your health issues/concerns, can participate in your care, and manage your care along with your physician: confident.    Safety & Fall Risk:    1) Any falls in the past year? No    2) Any use of assistive devices to ambulate? No    Current Suppliers (DME): none      Functional ability evaluation:    1) Does patient need assistance with ADLs, managing finances, remembering to take medications, using the phone, shopping for food, making meals, and performing housework? No    Educated patient on the importance of carrying a medication list with them at all times.    2) Is patient is incontinent of urine or stool? No    3) Does the patient still drive? No  If yes, are you having difficulties driving?  Not Applicable.    Does the patient fasten their seat belt?Other.    Hearing loss screen:    1)  Hearing Test Finger Rub Test:  normal    2) Does pt wear hearing aids? No    3)Does patient have to strain or struggle to hear/understand conversations? No    Exercise and Nutrition:     1) Has patient had any unplanned weight loss in the last 3 months? No    2) Does the patient exercise and if yes, how often? no    3) Does patient have a pressure ulcer or non healing wound? No    4) Would patient like to be referred to nutrition if they have a calculated body mass index < 18.5 or > 30? No    Estimated body mass index is 38.32 kg/m?? as calculated from the following:    Height as of 02/03/18: 160 cm (5' 3).    Weight as of 04/08/18: 98.1 kg (216 lb 4.8 oz).  No height and weight on file for this encounter.      Health maintenance:  Mammogram: 08/2017  Bone density screening: not covered by Medicare  Colonoscopy: declined  by the pt but agrees to have Cologaurd   Influenza vaccine: Up-to-date 2019  Hep A and hep B series: Completed 2019  Pneumovax: 2019  Prevnar: defer until next year Shingrix: Patient given printed prescription to receive a local pharmacy  Eye exam:  Pt to schedule in the near future  Breast  exam: performed today    End of life planning:     04/12/2018    Jenell Milliner, MD     The patient reports having no living will. ACP form given to the pt to review, complete and return scan into EMR         Psychosocial risks:     Social history updated and reviewed.    Past history of Psychiatric illness: depression    PHQ-9: improved    Discussed and reviewed results with the patient.     Physical exam  Vital signs as recorded in EMR  Neck exam: no masses, carotid pulses without bruits  Lung exam: clear  Cardiac exam: RRR nl s1 and s2  Breast exam: no masses or nipple discharge  Abdominal exam: non tender, no HSM or masses palpated  Extremity exam: no edema, distal pulses are normal         Jenell Milliner  04/12/2018

## 2018-04-13 ENCOUNTER — Ambulatory Visit: Admit: 2018-04-13 | Discharge: 2018-04-14 | Payer: MEDICARE

## 2018-04-13 DIAGNOSIS — Z1211 Encounter for screening for malignant neoplasm of colon: Secondary | ICD-10-CM

## 2018-04-13 DIAGNOSIS — I10 Essential (primary) hypertension: Secondary | ICD-10-CM

## 2018-04-13 DIAGNOSIS — Z0000 Encounter for general adult medical examination without abnormal findings: Principal | ICD-10-CM

## 2018-04-13 MED ORDER — VARICELLA-ZOSTER GLYCOE VACC-AS01B ADJ(PF) 50 MCG/0.5 ML IM SUSP, KIT
Freq: Once | INTRAMUSCULAR | 0 refills | 0 days | Status: CP
Start: 2018-04-13 — End: 2018-04-13

## 2018-04-14 LAB — LIPID PANEL
CHOLESTEROL/HDL RATIO SCREEN: 2.3 (ref <5.0)
HDL CHOLESTEROL: 76 mg/dL — ABNORMAL HIGH (ref 40–59)
LDL CHOLESTEROL CALCULATED: 80 mg/dL (ref 60–99)
TRIGLYCERIDES: 90 mg/dL (ref 1–149)
VLDL CHOLESTEROL CAL: 18 mg/dL (ref 11–41)

## 2018-04-14 LAB — CHOLESTEROL/HDL RATIO SCREEN: Lab: 2.3

## 2018-04-14 NOTE — Unmapped (Signed)
Normal lipid panel result.

## 2018-04-16 ENCOUNTER — Encounter: Admit: 2018-04-16 | Discharge: 2018-04-17 | Payer: MEDICARE

## 2018-04-16 DIAGNOSIS — K7030 Alcoholic cirrhosis of liver without ascites: Principal | ICD-10-CM

## 2018-05-04 MED ORDER — MAGNESIUM OXIDE 400 MG (241.3 MG MAGNESIUM) TABLET
ORAL_TABLET | Freq: Two times a day (BID) | ORAL | 1 refills | 0 days | Status: CP
Start: 2018-05-04 — End: 2018-11-01

## 2018-05-24 MED ORDER — CARVEDILOL 3.125 MG TABLET
ORAL_TABLET | Freq: Two times a day (BID) | ORAL | 1 refills | 0 days | Status: CP
Start: 2018-05-24 — End: 2018-11-12

## 2018-05-31 MED ORDER — LACTULOSE 10 GRAM/15 ML ORAL SOLUTION
Freq: Three times a day (TID) | ORAL | 1 refills | 0 days | Status: CP
Start: 2018-05-31 — End: 2018-08-19

## 2018-08-04 NOTE — Unmapped (Deleted)
[name] is a 73 y.o. female  is here for No chief complaint on file.      Assessment/Plan:    There are no diagnoses linked to this encounter.      No follow-ups on file.    Subjective:     HPI  The pt is seen today for 4 month follow up visit.  She was last seen in 04/2018 for AWV. 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  - alcoholic liver disease- she is followed by [facility] clinic- see below for details.  She was to continue with current BB regimen and lactulose as directed. Her LFT's were normal in 11/2-19 except for chronically elevated Alk phos level.  - HTN- she  Remains normotensive on current medication regimen. She is advised on low salt intake, exercise and home BP monitoring.  He had normal serum electrolytes creatinine and glucose levels in November 2019.  CBC was also normal at that time and her last TSH was normal in February 2019.    The patient was seen by GI clinic in 04/2018 and the following encounter impression is noted:    Assessment   ASSESSMENT:                                               [name] is a 73 yo female with liver disease from ASH/NASH.   She is committed to her sobriety and feels she has good family support.   She should continue her strengthen exercises and improved nutrition.   MELD is low.   Plan   PLAN:                                                              -This patient was seen and reviewed with Dr. Ruffin Frederick  -MELD labs today  -2G Na+, higher protein diet  - titrate lactulose to 2-3 BMs per day  -HCC screening due, ordered  -RTC in 6 months, sooner if needed.   She is scheduled this Friday for ultrasound.        ROS  Constitutional:  Denies  unexpected weight loss or gain, or weakness   Eyes:  Denies visual changes  Respiratory:  Denies cough or shortness of breath. No change in exercise  tolerance  Cardiovascular:  Denies chest pain, palpitations or lower extremity swelling   GI:  Denies abdominal pain, diarrhea, constipation   Musculoskeletal:  Denies myalgias  Skin:  Denies nonhealing lesions  Neurologic:  Denies headache, focal weakness or numbness, tingling  Endocrine:  Denies polyuria or polydypsia   Psychiatric:  Denies depression, anxiety      Outpatient Medications Prior to Visit   Medication Sig Dispense Refill   ??? acetaminophen (TYLENOL) 325 MG tablet Take 2 tablets (650 mg total) by mouth every six (6) hours as needed for pain.  0   ??? carvedilol (COREG) 3.125 MG  tablet Take 1 tablet (3.125 mg total) by mouth Two (2) times a day. Hold for: HR < 60 and/or SBP < 90 and/or MAP < 70 180 tablet 1   ??? cyanocobalamin 1000 MCG tablet Take 1,000 mcg by mouth daily.     ??? diclofenac sodium (VOLTAREN) 1 % gel Apply 2 g topically Four (4) times a day. 100 g 0   ??? escitalopram oxalate (LEXAPRO) 20 MG tablet Take 1 tablet (20 mg total) by mouth daily. 30 tablet 2   ??? folic acid (FOLVITE) 1 MG tablet Take 1 tablet (1 mg total) by mouth daily. 90 tablet 3   ??? lactulose (CHRONULAC) 10 gram/15 mL solution Take 15 mL (10 g total) by mouth Three (3) times a day. Take enough to have 3 BM's per day 4050 mL 1   ??? magnesium oxide (MAG-OX) 400 mg (241.3 mg magnesium) tablet Take 1 tablet (400 mg total) by mouth Two (2) times a day. 180 tablet 1   ??? oxymetazoline (AFRIN) 0.05 % nasal spray 2 sprays Two (2) times a day.     ??? potassium chloride SA (K-DUR,KLOR-CON) 20 MEQ tablet Take 1 tablet (20 mEq total) by mouth Two (2) times a day. 180 tablet 1   ??? thiamine mononitrate, vit B1, 100 mg Tab tablet Take 2.5 tablets (250 mg total) by mouth daily. 225 tablet 1     No facility-administered medications prior to visit.          Objective:       Vital Signs  There were no vitals taken for this visit.     Exam  General: normal appearance EYES: Anicteric sclerae.  ENT: Oropharynx moist.  RESP: Relaxed respiratory effort. Clear to auscultation without wheezes or crackles.   CV: Regular rate and rhythm. Normal S1 and S2. No murmurs or gallops.  No lower extremity edema. Posterior tibial pulses are 2+ and symmetric.  abd exam: non tender, no masses, no HSM   MSK: No focal muscle tenderness.  SKIN: Appropriately warm and moist.  NEURO: Stable gait and coordination.    Allergies:     Sulfa (sulfonamide antibiotics); Sulfur; and Aspirin    Current Medications:     Current Outpatient Medications   Medication Sig Dispense Refill   ??? acetaminophen (TYLENOL) 325 MG tablet Take 2 tablets (650 mg total) by mouth every six (6) hours as needed for pain.  0   ??? carvedilol (COREG) 3.125 MG tablet Take 1 tablet (3.125 mg total) by mouth Two (2) times a day. Hold for: HR < 60 and/or SBP < 90 and/or MAP < 70 180 tablet 1   ??? cyanocobalamin 1000 MCG tablet Take 1,000 mcg by mouth daily.     ??? diclofenac sodium (VOLTAREN) 1 % gel Apply 2 g topically Four (4) times a day. 100 g 0   ??? escitalopram oxalate (LEXAPRO) 20 MG tablet Take 1 tablet (20 mg total) by mouth daily. 30 tablet 2   ??? folic acid (FOLVITE) 1 MG tablet Take 1 tablet (1 mg total) by mouth daily. 90 tablet 3   ??? lactulose (CHRONULAC) 10 gram/15 mL solution Take 15 mL (10 g total) by mouth Three (3) times a day. Take enough to have 3 BM's per day 4050 mL 1   ??? magnesium oxide (MAG-OX) 400 mg (241.3 mg magnesium) tablet Take 1 tablet (400 mg total) by mouth Two (2) times a day. 180 tablet 1   ??? oxymetazoline (AFRIN) 0.05 % nasal spray 2  sprays Two (2) times a day.     ??? potassium chloride SA (K-DUR,KLOR-CON) 20 MEQ tablet Take 1 tablet (20 mEq total) by mouth Two (2) times a day. 180 tablet 1   ??? thiamine mononitrate, vit B1, 100 mg Tab tablet Take 2.5 tablets (250 mg total) by mouth daily. 225 tablet 1     No current facility-administered medications for this visit.              Note - This record has been created using [employer]. Chart creation errors have been sought, but may not always have been located. Such creation errors do not reflect on the standard of medical care.    Jenell Milliner, MD

## 2018-08-19 MED ORDER — POTASSIUM CHLORIDE ER 20 MEQ TABLET,EXTENDED RELEASE(PART/CRYST)
ORAL_TABLET | Freq: Two times a day (BID) | ORAL | 1 refills | 0 days | Status: CP
Start: 2018-08-19 — End: 2019-08-20

## 2018-08-19 MED ORDER — LACTULOSE 10 GRAM/15 ML ORAL SOLUTION
Freq: Three times a day (TID) | ORAL | 1 refills | 0.00000 days | Status: CP
Start: 2018-08-19 — End: 2019-08-19

## 2018-08-19 NOTE — Unmapped (Signed)
Patient is to contact her PCP for medication refills. Not a patient of Dr. Charlean Sanfilippo at [facility] [place].    sp

## 2018-08-19 NOTE — Unmapped (Signed)
I saw this patient in the OBS unit 06/2017. Defer to PCP for refills.

## 2018-08-19 NOTE — Unmapped (Signed)
Dr.Wehby,  is pt still taking this medication?    Please advise.    Thank you!  sp

## 2018-08-23 MED ORDER — THIAMINE HCL (VITAMIN B1) 100 MG TABLET
ORAL_TABLET | Freq: Every day | ORAL | 1 refills | 0.00000 days | Status: CP
Start: 2018-08-23 — End: 2019-08-23

## 2018-10-28 ENCOUNTER — Encounter: Admit: 2018-10-28 | Discharge: 2018-10-29 | Payer: MEDICARE

## 2018-10-28 DIAGNOSIS — K7460 Unspecified cirrhosis of liver: Principal | ICD-10-CM

## 2018-10-28 NOTE — Unmapped (Signed)
Upcoming Tests:   [facility], please call [phone] option 3 to schedule    You are due blood work, you may have it at Dr Kenton Kingfisher office or at the [facility] the day for your US>         Please follow a 2000mg  sodium restricted diet  Increase your protein intake by eating eggs, chicken, fish, yogurt and/or supplements.       It is OK to take up to 2000mg  of acetaminophen (tylenol), please be cautious of combination products. Do not take ibuprofen, naproxen, aspirin, advil, aleve, motrin, Excedrin or headache powders. Avoid herbal or natural supplements.     If you have questions please contact our nurse, Jearld Lesch at [phone].     Please Return to clinic in 6 months.   If you have not received a notice about a follow-up appointment please call the [facility] at [phone]

## 2018-10-28 NOTE — Unmapped (Signed)
[facility], [place]        Referring Provider:  Michaell Cowing, MD  [address]  [address]  [place], [place] [address]     Primary Care Provider:  Jenell Milliner, MD          PATIENT PROFILE:        [name] is a 73 y.o. 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   She is committed to her sobriety and feels she has good family support.   She should continue her strengthen exercises and improved nutrition.   MELD is low.         PLAN:       -This patient was seen and reviewed with Dr. Ruffin Frederick  -MELD labs with [place] or with next PCP Visit.   -2G Na+, higher protein diet  - titrate lactulose to 2-3 BMs per day  -HCC screening due, ordered, prefers to have at [facility].   -RTC in 6 months, sooner if needed.         Telemedicine Documentation  I spent 20 minutes on the phone with the patient. I spent an additional 5 minutes on pre- and post-visit activities.     The patient was physically located in [place] or a state in which I am permitted to provide care. The patient and/or parent/gauardian understood that s/he may incur co-pays and cost sharing, and agreed to the telemedicine visit. The visit was completed via phone and/or video, which was appropriate and reasonable under the circumstances given the patient's presentation at the time.    The patient and/or parent/guardian has been advised of the potential risks and limitations of this mode of treatment (including, but not limited to, the absence of in-person examination) and has agreed to be treated using telemedicine. The patient's/patient's family's questions regarding telemedicine have been answered.     If the phone/video visit was completed in an ambulatory setting, the patient and/or parent/guardian has also been advised to contact their provider???s office for worsening conditions, and seek emergency medical treatment and/or call 911 if the patient deems either necessary.          CHIEF COMPLAINT: I'm here for my liver     HISTORY OF PRESENT ILLNESS: This is a 73 y.o. year old female with liver disease related to previous, long term alcohol use. She had a comprehensive liver evaluation (-HFE, Viral, AI)  a few years ago, but alcohol was not mentioned. She endorses drinking 1-2 bottle of wine per day for the last 14-15 years. She was admitted to [facility] in Jan 2019 with vomiting,  delirium and likely alcohol withdrawal. Treated for PSE with improvement. Blood work and imaging c/w cirrhosis. EGD,  showed Gr 1 EV and PHG.   Prior to her admission; she had been declining for months and had become physically very weak.  She endorses improved balance and strength. She endorses eating better and following a low sodium diet. Patient denies complaints related to the liver.  Specifically, no ascites, lower extremity edema, gastrointestinal bleeding, puritus or confusion. In addition the patient denies chest pain, shortness of breath, fevers or weight loss.                 REVIEW OF SYSTEMS:     The balance of 12 systems reviewed is negative except as  noted in the HPI.     PAST MEDICAL HISTORY:    Past Medical History:   Diagnosis Date   ??? Alcoholism (CMS-HCC)    ??? Alcoholism /alcohol abuse (CMS-HCC)    ??? Cirrhosis (CMS-HCC)    ??? Depression    ??? Hypertension    ??? Liver disease        PAST SURGICAL HISTORY:    Past Surgical History:   Procedure Laterality Date   ??? BREAST BIOPSY Right     benign-a long time ago   ??? btl     ??? CHOLECYSTECTOMY     ??? PR UPPER GI ENDOSCOPY,BIOPSY N/A 02/03/2018    Procedure: UGI ENDOSCOPY; WITH BIOPSY, SINGLE OR MULTIPLE;  Surgeon: Alfred Levins, MD;  Location: [facility] PROCED[facility];  Service: Gastroenterology       MEDICATIONS:      Current Outpatient Medications:   ???  acetaminophen (TYLENOL) 325 MG tablet, Take 2 tablets (650 mg total) by mouth every six (6) hours as needed for pain., Disp: , Rfl: 0  ???  carvedilol (COREG) 3.125 MG tablet, Take 1 tablet (3.125 mg total) by mouth Two (2) times a day. Hold for: HR < 60 and/or SBP < 90 and/or MAP < 70, Disp: 180 tablet, Rfl: 1  ???  cyanocobalamin 1000 MCG tablet, Take 1,000 mcg by mouth daily., Disp: , Rfl:   ???  escitalopram oxalate (LEXAPRO) 20 MG tablet, Take 1 tablet (20 mg total) by mouth daily., Disp: 30 tablet, Rfl: 2  ???  folic acid (FOLVITE) 1 MG tablet, Take 1 tablet (1 mg total) by mouth daily., Disp: 90 tablet, Rfl: 3  ???  lactulose (CHRONULAC) 10 gram/15 mL solution, Take 15 mL (10 g total) by mouth Three (3) times a day. Take enough to have 3 BM's per day, Disp: 4050 mL, Rfl: 1  ???  magnesium oxide (MAG-OX) 400 mg (241.3 mg magnesium) tablet, Take 1 tablet (400 mg total) by mouth Two (2) times a day., Disp: 180 tablet, Rfl: 1  ???  oxymetazoline (AFRIN) 0.05 % nasal spray, 2 sprays Two (2) times a day., Disp: , Rfl:   ???  potassium chloride (KLOR-CON) 20 MEQ CR tablet, Take 1 tablet (20 mEq total) by mouth Two (2) times a day., Disp: 180 tablet, Rfl: 1  ???  thiamine (VITAMIN B-1) 100 MG tablet, Take 2.5 tablets (250 mg total) by mouth daily., Disp: 225 tablet, Rfl: 1    ALLERGIES:    Sulfa (sulfonamide antibiotics); Sulfur; and Aspirin    SOCIAL HISTORY:    Social History     Socioeconomic History   ??? Marital status: Married     Spouse name: Not on file   ??? Number of children: Not on file   ??? Years of education: Not on file   ??? Highest education level: Not on file   Occupational History   ??? Not on file   Social Needs   ??? Financial resource strain: Not on file   ??? Food insecurity     Worry: Not on file     Inability: Not on file   ??? Transportation needs     Medical: Not on file     Non-medical: Not on file   Tobacco Use   ??? Smoking status: Never Smoker   ??? Smokeless tobacco: Never Used   Substance and Sexual Activity   ??? Alcohol use: Not Currently   ??? Drug use: Never   ??? Sexual activity: Not  on file   Lifestyle   ??? Physical activity     Days per week: Not on file     Minutes per session: Not on file   ??? Stress: Not on file   Relationships   ??? Social Wellsite geologist on phone: Not on file     Gets together: Not on file     Attends religious service: Not on file     Active member of club or organization: Not on file     Attends meetings of clubs or organizations: Not on file     Relationship status: Not on file   Other Topics Concern   ??? Not on file   Social History Narrative   ??? Not on file       FAMILY HISTORY:    family history includes Heart disease in her mother; Lupus in her brother; No Known Problems in her daughter, father, maternal grandfather, maternal grandmother, paternal grandfather, paternal grandmother, and sister.      VITAL SIGNS:    DIAGNOSTIC STUDIES:  I have reviewed all pertinent diagnostic studies, including:    GI Procedures:    EGD 02/03/18  Findings:       Grade I varices were found in the lower third of the esophagus. 2        columns.       A widely patent Schatzki ring was found at the gastroesophageal junction.       A 2 cm hiatal hernia was present.       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       Retained fluid was found in the gastric body. Clear fluid with        particulate matter.       There is no endoscopic evidence of varices in the entire examined        stomach.       No gross lesions were noted in the entire examined duodenum.                                                                                   Impression:        - Grade I esophageal varices.                     - Widely patent Schatzki ring.                     - 2 cm hiatal hernia.                     - Portal hypertensive gastropathy.                     - Retained gastric fluid.                     - No gross lesions in the entire examined duodenum.                     - No specimens collected.      Radiographic studies:  Reviewed  Laboratory results:  Results for orders placed or performed in visit on 04/13/18   Lipid Panel   Result Value Ref Range    Triglycerides 90 1 - 149 mg/dL    Cholesterol 098 119 - 199 mg/dL    HDL 76 (H) 40 - 59 mg/dL    LDL Calculated 80 60 - 99 mg/dL    VLDL Cholesterol Cal 18 11 - 41 mg/dL    Chol/HDL Ratio 2.3 <1.4    Non-HDL Cholesterol 98 mg/dL    FASTING No      MELD-Na score: 10 at 04/08/2018  9:04 AM  MELD score: 10 at 04/08/2018  9:04 AM  Calculated from:  Serum Creatinine: 0.47 mg/dL (Rounded to 1 mg/dL) at 78/07/9560  1:30 AM  Serum Sodium: 138 mmol/L (Rounded to 137 mmol/L) at 04/08/2018  9:04 AM  Total Bilirubin: 2.1 mg/dL at 86/09/7844  9:62 AM  INR(ratio): 1.12 at 04/08/2018  9:04 AM  Age: 67 years

## 2018-10-29 MED ORDER — FOLIC ACID 1 MG TABLET
ORAL_TABLET | Freq: Every day | ORAL | 1 refills | 0 days | Status: CP
Start: 2018-10-29 — End: 2019-10-29

## 2018-11-01 MED ORDER — MAGNESIUM OXIDE 400 MG (241.3 MG MAGNESIUM) TABLET
ORAL_TABLET | Freq: Two times a day (BID) | ORAL | 1 refills | 0 days | Status: CP
Start: 2018-11-01 — End: 2019-11-01

## 2018-11-12 MED ORDER — CARVEDILOL 3.125 MG TABLET
ORAL_TABLET | 1 refills | 0 days | Status: CP
Start: 2018-11-12 — End: ?

## 2019-02-17 DIAGNOSIS — F329 Major depressive disorder, single episode, unspecified: Secondary | ICD-10-CM

## 2019-02-17 MED ORDER — THIAMINE MONONITRATE (VITAMIN B1) 100 MG TABLET
ORAL_TABLET | Freq: Every day | ORAL | 1 refills | 90 days | Status: CP
Start: 2019-02-17 — End: 2020-02-17

## 2019-02-17 NOTE — Unmapped (Signed)
Pt needs appt , last visit 04/13/18, cancelled in March due to COVID

## 2019-02-17 NOTE — Unmapped (Signed)
P/C requesting refill on Lexapro , last ordered on 01/11/18, last visit 04/13/18.

## 2019-02-18 MED ORDER — ESCITALOPRAM 20 MG TABLET
ORAL_TABLET | Freq: Every day | ORAL | 0 refills | 90 days | Status: CP
Start: 2019-02-18 — End: 2019-03-07

## 2019-02-27 MED ORDER — POTASSIUM CHLORIDE ER 20 MEQ TABLET,EXTENDED RELEASE(PART/CRYST)
ORAL_TABLET | Freq: Two times a day (BID) | ORAL | 1 refills | 90 days | Status: CP
Start: 2019-02-27 — End: 2020-02-27

## 2019-03-03 NOTE — Unmapped (Signed)
Assessment/Plan:    [name] was seen today for follow-up.    Diagnoses and all orders for this visit:    Alcoholic liver disease (CMS-HCC)  -     US Abdomen Limited; Future    Hypertension, unspecified type  -     Lipid Panel    Encephalopathy  -     lactulose (CHRONULAC) 10 gram/15 mL solution; Take 15 mL (10 g total) by mouth Three (3) times a day. Take enough to have 3 BM's per day    Depression with anxiety    Cirrhosis of liver without ascites, unspecified hepatic cirrhosis type (CMS-HCC)  -     PT-INR  -     CBC  -     Comprehensive metabolic panel    Other orders  -     INFLUENZA VACCINE (QUAD) IM - 6 MO-ADULT - PF    - history of alcoholism, with resulting alcoholic liver disease and encephalopathy: pt is doing well. She is taking lactulose up to 3 times a day and having 3 soft BM's daily. She is referred for annual Liver US and she will schedule follow up appt with the liver clinic for next month.   CBC. CMP and PT/INR check today as hepatologist ordered.  - HTN: good control on present med regimen. She is counseled on low salt intake and home BP monitoring. I will check fasting lipid panel today along with CMP.  - depression with anxiety: stable on lexapro 20 mg daily.    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       Return in about 3 months (around 06/07/2019), or 40 min AWV.    Subjective:     HPI  The patient is seen today for follow-up visit.  She was last seen in November/2019 for annual Medicare wellness visit.  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 She may increase lexapro to 20 mg daily  - alcoholic liver disease- she is followed by [facility] and her last visit there was in 04/2018 her last EGD was in September/2019 and revealed 2 cm hiatal hernia, portal hypertension gastropathy and no gross lesions were noted in the duodenum... She was to  continue with current BB regimen and lactulose as directed. She is due for repeat imaging and follow with liver clinic next month.   - HTN- she  Remains normotensive on current medication regimen. She is advised on low salt intake, exercise and home BP monitoring.  Comprehensive lab work done in November/2019 revealed a lipid panel with an HDL/LDL of 76/ 80, CMP with normal AST/ALT, elevated alk phos at 162 but otherwise electrolytes and serum creatinine level, normal GGT CBC and PT/INR.    The patient had a telemedicine GI encounter in 10/2018.   Assessment                                            [name] is a 73 yo female with liver disease from ASH/NASH.   She is committed to her sobriety and feels she has good family support.   She should continue her strengthen exercises and improved nutrition.  MELD is low.   Plan   PLAN:                                                              -This patient was seen and reviewed with Dr. Ruffin Frederick  -MELD labs with [place] or with next PCP Visit.   -2G Na+, higher protein diet  - titrate lactulose to 2-3 BMs per day  -HCC screening due, ordered, prefers to have at [facility].   -RTC in 6 months, sooner if needed.   She was advised to return for CBC, CMP and PT INR check and she can have this lab work done today.  The pt reports occasional joint pain and stiffness.     ROS  Constitutional:  Denies  unexpected weight loss or gain, or weakness   Eyes:  Denies visual changes  Respiratory:  Denies cough or shortness of breath. No change in exercise  tolerance  Cardiovascular:  Denies chest pain, palpitations or lower extremity swelling   GI:  Denies abdominal pain, diarrhea, constipation   Musculoskeletal:  Denies myalgias  Skin:  Denies nonhealing lesions  Neurologic:  Denies headache, focal weakness or numbness, tingling  Endocrine:  Denies polyuria or polydypsia   Psychiatric: Denies depression, anxiety      Outpatient Medications Prior to Visit   Medication Sig Dispense Refill   ??? acetaminophen (TYLENOL) 325 MG tablet Take 2 tablets (650 mg total) by mouth every six (6) hours as needed for pain.  0   ??? carvediloL (COREG) 3.125 MG tablet TAKE 1 TABLET BY MOUTH TWICE DAILY. HOLD FOR HEART RATE LESS THAN 60 AND/OR SYSTOLIC BLOOD PRESSURE LESS THAN 90 OR MAP LESS THAN 70 180 tablet 1   ??? cyanocobalamin 1000 MCG tablet Take 1,000 mcg by mouth daily.     ??? folic acid (FOLVITE) 1 MG tablet Take 1 tablet (1 mg total) by mouth daily. 90 tablet 1   ??? magnesium oxide (MAG-OX) 400 mg (241.3 mg magnesium) tablet Take 1 tablet (400 mg total) by mouth Two (2) times a day. 180 tablet 1   ??? oxymetazoline (AFRIN) 0.05 % nasal spray 2 sprays two (2) times a day as needed.      ??? potassium chloride (KLOR-CON) 20 MEQ CR tablet Take 1 tablet (20 mEq total) by mouth Two (2) times a day. 180 tablet 1   ??? thiamine mononitrate, vit B1, (VITAMIN B-1, MONONITRATE,) 100 mg Tab tablet Take 2.5 tablets (250 mg total) by mouth daily. 225 tablet 1   ??? escitalopram oxalate (LEXAPRO) 20 MG tablet Take 1 tablet (20 mg total) by mouth daily. 90 tablet 0   ??? lactulose (CHRONULAC) 10 gram/15 mL solution Take 15 mL (10 g total) by mouth Three (3) times a day. Take enough to have 3 BM's per day 4050 mL 1   ??? escitalopram oxalate (LEXAPRO) 20 MG tablet Take 1 tablet (20 mg total) by mouth daily. 30 tablet 2     No facility-administered medications prior to visit.          Objective:       Vital Signs  BP 120/60  - Pulse 74  - Temp 36.9 ??C (98.4 ??F) (Temporal)  - SpO2 98%      Exam  General: normal appearance  EYES:  Anicteric sclerae.  ENT: Oropharynx moist.  RESP: Relaxed respiratory effort. Clear to auscultation without wheezes or crackles.   CV: Regular rate and rhythm. Normal S1 and S2. No murmurs or gallops.  No lower extremity edema. Posterior tibial pulses are 2+ and symmetric.  abd exam: non tender, no masses, no HSM MSK: No focal muscle tenderness.  SKIN: Appropriately warm and moist.  NEURO: Stable gait and coordination.    Allergies:     Sulfa (sulfonamide antibiotics), Sulfur, and Aspirin    Current Medications:     Current Outpatient Medications   Medication Sig Dispense Refill   ??? acetaminophen (TYLENOL) 325 MG tablet Take 2 tablets (650 mg total) by mouth every six (6) hours as needed for pain.  0   ??? carvediloL (COREG) 3.125 MG tablet TAKE 1 TABLET BY MOUTH TWICE DAILY. HOLD FOR HEART RATE LESS THAN 60 AND/OR SYSTOLIC BLOOD PRESSURE LESS THAN 90 OR MAP LESS THAN 70 180 tablet 1   ??? cyanocobalamin 1000 MCG tablet Take 1,000 mcg by mouth daily.     ??? folic acid (FOLVITE) 1 MG tablet Take 1 tablet (1 mg total) by mouth daily. 90 tablet 1   ??? lactulose (CHRONULAC) 10 gram/15 mL solution Take 15 mL (10 g total) by mouth Three (3) times a day. Take enough to have 3 BM's per day 4050 mL 3   ??? magnesium oxide (MAG-OX) 400 mg (241.3 mg magnesium) tablet Take 1 tablet (400 mg total) by mouth Two (2) times a day. 180 tablet 1   ??? oxymetazoline (AFRIN) 0.05 % nasal spray 2 sprays two (2) times a day as needed.      ??? potassium chloride (KLOR-CON) 20 MEQ CR tablet Take 1 tablet (20 mEq total) by mouth Two (2) times a day. 180 tablet 1   ??? thiamine mononitrate, vit B1, (VITAMIN B-1, MONONITRATE,) 100 mg Tab tablet Take 2.5 tablets (250 mg total) by mouth daily. 225 tablet 1   ??? escitalopram oxalate (LEXAPRO) 20 MG tablet Take 1 tablet (20 mg total) by mouth daily. 30 tablet 2     No current facility-administered medications for this visit.            Note - This record has been created using [employer]. Chart creation errors have been sought, but may not always have been located. Such creation errors do not reflect on the standard of medical care.    Jenell Milliner, MD

## 2019-03-07 ENCOUNTER — Encounter: Admit: 2019-03-07 | Discharge: 2019-03-08 | Payer: MEDICARE

## 2019-03-07 DIAGNOSIS — I10 Essential (primary) hypertension: Secondary | ICD-10-CM

## 2019-03-07 DIAGNOSIS — F418 Other specified anxiety disorders: Secondary | ICD-10-CM

## 2019-03-07 DIAGNOSIS — K709 Alcoholic liver disease, unspecified: Secondary | ICD-10-CM

## 2019-03-07 DIAGNOSIS — G9340 Encephalopathy, unspecified: Secondary | ICD-10-CM

## 2019-03-07 DIAGNOSIS — K7460 Unspecified cirrhosis of liver: Secondary | ICD-10-CM

## 2019-03-07 LAB — COMPREHENSIVE METABOLIC PANEL
ALBUMIN: 3.9 g/dL (ref 3.5–5.0)
ALKALINE PHOSPHATASE: 167 U/L — ABNORMAL HIGH (ref 38–126)
ALT (SGPT): 12 U/L (ref <35)
ANION GAP: 12 mmol/L (ref 7–15)
AST (SGOT): 33 U/L (ref 14–38)
BILIRUBIN TOTAL: 1.9 mg/dL — ABNORMAL HIGH (ref 0.0–1.2)
BLOOD UREA NITROGEN: 7 mg/dL (ref 7–21)
BUN / CREAT RATIO: 15
CALCIUM: 9.1 mg/dL (ref 8.5–10.2)
CHLORIDE: 106 mmol/L (ref 98–107)
CO2: 23 mmol/L (ref 22.0–30.0)
CREATININE: 0.48 mg/dL — ABNORMAL LOW (ref 0.60–1.00)
EGFR CKD-EPI AA FEMALE: >90 mL/min/{1.73_m2} (ref >=60)
GLUCOSE RANDOM: 80 mg/dL (ref 70–179)
POTASSIUM: 4.4 mmol/L (ref 3.5–5.0)
PROTEIN TOTAL: 6.8 g/dL (ref 6.5–8.3)
SODIUM: 141 mmol/L (ref 135–145)

## 2019-03-07 LAB — RED CELL DISTRIBUTION WIDTH: Lab: 14.4

## 2019-03-07 LAB — CBC
HEMATOCRIT: 47.3 % — ABNORMAL HIGH (ref 36.0–46.0)
HEMOGLOBIN: 15.6 g/dL (ref 12.0–16.0)
MEAN CORPUSCULAR HEMOGLOBIN CONC: 33.1 g/dL (ref 31.0–37.0)
MEAN CORPUSCULAR VOLUME: 103 fL — ABNORMAL HIGH (ref 80.0–100.0)
MEAN PLATELET VOLUME: 9.6 fL (ref 7.0–10.0)
PLATELET COUNT: 192 10*9/L (ref 150–440)
RED BLOOD CELL COUNT: 4.59 10*12/L (ref 4.00–5.20)
RED CELL DISTRIBUTION WIDTH: 14.4 % (ref 12.0–15.0)
WBC ADJUSTED: 8.1 10*9/L (ref 4.5–11.0)

## 2019-03-07 LAB — LIPID PANEL
CHOLESTEROL/HDL RATIO SCREEN: 2 (ref <5.0)
CHOLESTEROL: 175 mg/dL (ref 100–199)
HDL CHOLESTEROL: 87 mg/dL — ABNORMAL HIGH (ref 40–59)
LDL CHOLESTEROL CALCULATED: 72 mg/dL (ref 60–99)
TRIGLYCERIDES: 80 mg/dL (ref 1–149)
VLDL CHOLESTEROL CAL: 16 mg/dL (ref 11–41)

## 2019-03-07 LAB — CHOLESTEROL: Cholesterol:MCnc:Pt:Ser/Plas:Qn:: 175

## 2019-03-07 LAB — AST (SGOT): Aspartate aminotransferase:CCnc:Pt:Ser/Plas:Qn:: 33

## 2019-03-07 MED ORDER — LACTULOSE 10 GRAM/15 ML ORAL SOLUTION
Freq: Three times a day (TID) | ORAL | 3 refills | 90 days | Status: CP
Start: 2019-03-07 — End: 2020-03-06

## 2019-03-08 LAB — INR: Coagulation tissue factor induced.INR:RelTime:Pt:PPP:Qn:Coag: 1.15

## 2019-03-08 LAB — PROTIME-INR: PROTIME: 13.3 s — ABNORMAL HIGH (ref 10.2–13.1)

## 2019-03-08 NOTE — Unmapped (Signed)
Normal lipid panel results.

## 2019-03-17 ENCOUNTER — Encounter: Admit: 2019-03-17 | Discharge: 2019-03-18 | Payer: MEDICARE

## 2019-03-18 NOTE — Unmapped (Signed)
Notify the pt that her liver enzyme reveals cirrhotic liver consistent with chronic liver disease. She may review results at her upcoming appt with her hepatologist.

## 2019-03-21 ENCOUNTER — Encounter: Admit: 2019-03-21 | Discharge: 2019-03-22 | Payer: MEDICARE

## 2019-03-21 NOTE — Unmapped (Addendum)
[facility], [place]        Referring Provider:  Launa Flight, [place]  [address]  Medicine  [id number]  [place],  [place] [address]     Primary Care Provider:  Jenell Milliner, MD          PATIENT PROFILE:        [name] is a 73 y.o. 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   She is committed to her sobriety and feels she has good family support.   She should continue her strengthen exercises and improved nutrition.   MELD is low.         PLAN:       -This patient was seen and reviewed with Dr. Ruffin Frederick  -Labs reviewed   -2G Na+, higher protein diet  - titrate lactulose to 2-3 BMs per day  -HCC screening due, ordered, prefers to have at [facility].   -RTC in 6 months, sooner if needed.       This provider was on [facility] for the entire visit.     Telemedicine Documentation  I spent 20 minutes on the phone with the patient. I spent an additional 5 minutes on pre- and post-visit activities.     The patient was physically located in [place] or a state in which I am permitted to provide care. The patient and/or parent/gauardian understood that s/he may incur co-pays and cost sharing, and agreed to the telemedicine visit. The visit was completed via phone and/or video, which was appropriate and reasonable under the circumstances given the patient's presentation at the time.    The patient and/or parent/guardian has been advised of the potential risks and limitations of this mode of treatment (including, but not limited to, the absence of in-person examination) and has agreed to be treated using telemedicine. The patient's/patient's family's questions regarding telemedicine have been answered. If the phone/video visit was completed in an ambulatory setting, the patient and/or parent/guardian has also been advised to contact their provider???s office for worsening conditions, and seek emergency medical treatment and/or call 911 if the patient deems either necessary.          CHIEF COMPLAINT: I'm here for my liver     HISTORY OF PRESENT ILLNESS: This is a 73 y.o. year old female with liver disease related to previous, long term alcohol use. She had a comprehensive liver evaluation (-HFE, Viral, AI)  a few years ago, but alcohol was not mentioned. She endorses drinking 1-2 bottle of wine per day for the last 14-15 years. She was admitted to [facility] in Jan 2019 with vomiting,  delirium and likely alcohol withdrawal. Treated for PSE with improvement. Blood work and imaging c/w cirrhosis. EGD,  showed Gr 1 EV and PHG.   Prior to her admission; she had been declining for months and had become physically very weak.  She endorses improved balance and strength. She endorses eating better and following a low sodium diet. Patient denies complaints related to the liver.  Specifically, no ascites, lower extremity edema, gastrointestinal bleeding, puritus or confusion. In addition the patient denies chest pain, shortness of breath, fevers or weight loss.                 REVIEW OF SYSTEMS:     The balance of 12  systems reviewed is negative except as noted in the HPI.     PAST MEDICAL HISTORY:    Past Medical History:   Diagnosis Date   ??? Alcoholism (CMS-HCC)    ??? Alcoholism /alcohol abuse (CMS-HCC)    ??? Cirrhosis (CMS-HCC)    ??? Depression    ??? Hypertension    ??? Liver disease        PAST SURGICAL HISTORY:    Past Surgical History:   Procedure Laterality Date   ??? BREAST BIOPSY Right     benign-a long time ago   ??? btl     ??? CHOLECYSTECTOMY     ??? PR UPPER GI ENDOSCOPY,BIOPSY N/A 02/03/2018 Procedure: UGI ENDOSCOPY; WITH BIOPSY, SINGLE OR MULTIPLE;  Surgeon: Alfred Levins, MD;  Location: [facility];  Service: Gastroenterology       MEDICATIONS:      Current Outpatient Medications:   ???  acetaminophen (TYLENOL) 325 MG tablet, Take 2 tablets (650 mg total) by mouth every six (6) hours as needed for pain., Disp: , Rfl: 0  ???  carvediloL (COREG) 3.125 MG tablet, TAKE 1 TABLET BY MOUTH TWICE DAILY. HOLD FOR HEART RATE LESS THAN 60 AND/OR SYSTOLIC BLOOD PRESSURE LESS THAN 90 OR MAP LESS THAN 70, Disp: 180 tablet, Rfl: 1  ???  cyanocobalamin 1000 MCG tablet, Take 1,000 mcg by mouth daily., Disp: , Rfl:   ???  escitalopram oxalate (LEXAPRO) 20 MG tablet, Take 1 tablet (20 mg total) by mouth daily., Disp: 30 tablet, Rfl: 2  ???  folic acid (FOLVITE) 1 MG tablet, Take 1 tablet (1 mg total) by mouth daily., Disp: 90 tablet, Rfl: 1  ???  lactulose (CHRONULAC) 10 gram/15 mL solution, Take 15 mL (10 g total) by mouth Three (3) times a day. Take enough to have 3 BM's per day, Disp: 4050 mL, Rfl: 3  ???  magnesium oxide (MAG-OX) 400 mg (241.3 mg magnesium) tablet, Take 1 tablet (400 mg total) by mouth Two (2) times a day., Disp: 180 tablet, Rfl: 1  ???  oxymetazoline (AFRIN) 0.05 % nasal spray, 2 sprays two (2) times a day as needed. , Disp: , Rfl:   ???  potassium chloride (KLOR-CON) 20 MEQ CR tablet, Take 1 tablet (20 mEq total) by mouth Two (2) times a day., Disp: 180 tablet, Rfl: 1  ???  thiamine mononitrate, vit B1, (VITAMIN B-1, MONONITRATE,) 100 mg Tab tablet, Take 2.5 tablets (250 mg total) by mouth daily., Disp: 225 tablet, Rfl: 1    ALLERGIES:    Sulfa (sulfonamide antibiotics), Sulfur, and Aspirin    SOCIAL HISTORY:    Social History     Socioeconomic History   ??? Marital status: Married     Spouse name: Not on file   ??? Number of children: Not on file   ??? Years of education: Not on file   ??? Highest education level: Not on file   Occupational History   ??? Not on file   Social Needs ??? Financial resource strain: Not on file   ??? Food insecurity     Worry: Not on file     Inability: Not on file   ??? Transportation needs     Medical: Not on file     Non-medical: Not on file   Tobacco Use   ??? Smoking status: Never Smoker   ??? Smokeless tobacco: Never Used   Substance and Sexual Activity   ??? Alcohol use: Not Currently   ???  Drug use: Never   ??? Sexual activity: Not on file   Lifestyle   ??? Physical activity     Days per week: Not on file     Minutes per session: Not on file   ??? Stress: Not on file   Relationships   ??? Social Wellsite geologist on phone: Not on file     Gets together: Not on file     Attends religious service: Not on file     Active member of club or organization: Not on file     Attends meetings of clubs or organizations: Not on file     Relationship status: Not on file   Other Topics Concern   ??? Not on file   Social History Narrative   ??? Not on file       FAMILY HISTORY:    family history includes Heart disease in her mother; Lupus in her brother; No Known Problems in her daughter, father, maternal grandfather, maternal grandmother, paternal grandfather, paternal grandmother, and sister.      VITAL SIGNS:    DIAGNOSTIC STUDIES:  I have reviewed all pertinent diagnostic studies, including:    GI Procedures:    EGD 02/03/18  Findings:       Grade I varices were found in the lower third of the esophagus. 2        columns.       A widely patent Schatzki ring was found at the gastroesophageal junction.       A 2 cm hiatal hernia was present.       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       Retained fluid was found in the gastric body. Clear fluid with        particulate matter.       There is no endoscopic evidence of varices in the entire examined        stomach.       No gross lesions were noted in the entire examined duodenum. Impression:        - Grade I esophageal varices.                     - Widely patent Schatzki ring.                     - 2 cm hiatal hernia.                     - Portal hypertensive gastropathy.                     - Retained gastric fluid.                     - No gross lesions in the entire examined duodenum.                     - No specimens collected.      Radiographic studies:  US Abdomen Limited    Result Date: 03/17/2019  EXAM: US ABDOMEN LIMITED DATE: 03/17/2019 2:59 PM ACCESSION: [id number] UN DICTATED: 03/17/2019 3:00 PM INTERPRETATION LOCATION: Main [facility]     CLINICAL INDICATION: 73 years old Female with alcoholic liver disease  - K70.9 - Alcoholic liver disease (CMS - HCC)      TECHNIQUE: Static and cine images of the right upper quadrant were performed.     COMPARISON: Abdominal  ultrasound from 04/16/2018     FINDINGS:     LIVER: The liver is heterogeneous with coarsened echotexture and nodular contour. There is left hepatic lobe hypertrophy.. Redemonstration of hyperechoic lesion in the right hepatic lobe measuring 1.7 x 1.7 x 1.2 cm, previously 1.7 x 1.8 x 0.8 cm. No new hepatic lesions are identified. No biliary ductal dilatation.     GALLBLADDER: Surgically absent.     LIMITED RIGHT KIDNEY: No hydronephrosis.             Cirrhotic liver morphology. Unchanged hyperechoic lesion in the right hepatic lobe without definite correlating lesion on prior MRI, likely representing focal hepatic calcification.     No new hepatic lesion is identified..         Please see below for data measurements:         Liver: 15.6 cm     Gallbladder wall: mm Sonographic Murphy's Sign: Pericholecystic fluid visualized:     Common hepatic duct: 0.18 cm Proximal common bile duct: 0.36 cm Distal common bile duct: 0.44 cm     Right kidney length: 12.8 cm                 Laboratory results:  Results for orders placed or performed in visit on 03/07/19   Lipid Panel   Result Value Ref Range    Triglycerides 80 1 - 149 mg/dL Cholesterol 161 096 - 045 mg/dL    HDL 87 (H) 40 - 59 mg/dL    LDL Calculated 72 60 - 99 mg/dL    VLDL Cholesterol Cal 16 11 - 41 mg/dL    Chol/HDL Ratio 2.0 <4.0    Non-HDL Cholesterol 88 mg/dL    FASTING No    PT-INR   Result Value Ref Range    PT 13.3 (H) 10.2 - 13.1 sec    INR 1.15    CBC   Result Value Ref Range    WBC 8.1 4.5 - 11.0 10*9/L    RBC 4.59 4.00 - 5.20 10*12/L    HGB 15.6 12.0 - 16.0 g/dL    HCT 98.1 (H) 19.1 - 46.0 %    MCV 103.0 (H) 80.0 - 100.0 fL    MCH 34.1 (H) 26.0 - 34.0 pg    MCHC 33.1 31.0 - 37.0 g/dL    RDW 47.8 29.5 - 62.1 %    MPV 9.6 7.0 - 10.0 fL    Platelet 192 150 - 440 10*9/L   Comprehensive metabolic panel   Result Value Ref Range    Sodium 141 135 - 145 mmol/L    Potassium 4.4 3.5 - 5.0 mmol/L    Chloride 106 98 - 107 mmol/L    Anion Gap 12 7 - 15 mmol/L    CO2 23.0 22.0 - 30.0 mmol/L    BUN 7 7 - 21 mg/dL    Creatinine 3.08 (L) 0.60 - 1.00 mg/dL    BUN/Creatinine Ratio 15     EGFR CKD-EPI Non-African American, Female >90 >=60 mL/min/1.45m2    EGFR CKD-EPI African American, Female >90 >=60 mL/min/1.8m2    Glucose 80 70 - 179 mg/dL    Calcium 9.1 8.5 - 65.7 mg/dL    Albumin 3.9 3.5 - 5.0 g/dL    Total Protein 6.8 6.5 - 8.3 g/dL    Total Bilirubin 1.9 (H) 0.0 - 1.2 mg/dL    AST 33 14 - 38 U/L    ALT 12 <35 U/L    Alkaline Phosphatase 167 (H) 38 -  126 U/L     MELD-Na score: 11 at 03/07/2019  1:43 PM  MELD score: 11 at 03/07/2019  1:43 PM  Calculated from:  Serum Creatinine: 0.48 mg/dL (Rounded to 1 mg/dL) at 16/06/958  4:54 PM  Serum Sodium: 141 mmol/L (Rounded to 137 mmol/L) at 03/07/2019  1:43 PM  Total Bilirubin: 1.9 mg/dL at 02/07/1190  4:78 PM  INR(ratio): 1.15 at 03/07/2019  1:43 PM  Age: 89 years 5 months

## 2019-04-25 MED ORDER — FOLIC ACID 1 MG TABLET
ORAL_TABLET | Freq: Every day | ORAL | 1 refills | 90 days | Status: CP
Start: 2019-04-25 — End: 2020-04-24

## 2019-04-25 MED ORDER — MAGNESIUM OXIDE 400 MG (241.3 MG MAGNESIUM) TABLET
ORAL_TABLET | Freq: Two times a day (BID) | ORAL | 1 refills | 90.00000 days | Status: CP
Start: 2019-04-25 — End: 2020-04-24

## 2019-05-15 DIAGNOSIS — I10 Essential (primary) hypertension: Principal | ICD-10-CM

## 2019-05-15 DIAGNOSIS — K766 Portal hypertension: Principal | ICD-10-CM

## 2019-05-16 MED ORDER — CARVEDILOL 3.125 MG TABLET
ORAL_TABLET | Freq: Two times a day (BID) | ORAL | 1 refills | 90.00000 days | Status: CP
Start: 2019-05-16 — End: 2020-05-15

## 2019-05-17 NOTE — Unmapped (Signed)
PA initiated with covermymeds.   

## 2019-05-24 DIAGNOSIS — F418 Other specified anxiety disorders: Principal | ICD-10-CM

## 2019-05-25 MED ORDER — ESCITALOPRAM 20 MG TABLET
ORAL_TABLET | Freq: Every day | ORAL | 1 refills | 90.00000 days | Status: CP
Start: 2019-05-25 — End: 2020-05-24

## 2019-05-25 NOTE — Unmapped (Signed)
P/C requesting refill on Escitalopram , last ordered on 01/21/18, last visit 03/07/2019.

## 2019-07-13 NOTE — Unmapped (Signed)
Name:  [name]  DOB: November 29, 1945  Today's Date: 07/18/2019  Age:  74 y.o.    Assessment/Plan:      Personalized Prevention Plan: A personalized prevention plan was reviewed with the patient and a written copy was provided for personal records (available for review in Patient Instructions).    Risks identified: history of alcoholism and she has not had alcohol for over a year.  The pt has not fallen.  Her depression symptoms are controlled for the most part on her current medication regimen.  She has no recent falls.  She is compliant with her medication regimen and follow up appts as scheduled.       The following preventive services were discussed with the patient: See Health Maintenance recommendations listed below    Other Exam:    Annual lab work: The patient had recent normal comprehensive lab work in October/2020 which included CBC, CMP, lipids, PT/INR.  Results reviewed with the patient today.  She is due for screening annual TSH.    Referrals: Mammogram ; awaiting Cologuard result  Vaccines: Prevnar given today and  COVID-19 vaccine recommended  Follow up visit: 4 months      Barriers to goals identified and addressed. None identified.     Provider to discuss treatment options and their associated risks/benefits with the patient at their next appointment. Provider to discuss preventive services with the patient. Refer to Provider documentation.    Subjective/Objective:      Patient ID: [name] is a 74 y.o. female who presents for an Annual Wellness Visit .    HPI- I last saw the pt in 03/2019 for routine follow up visit. Her HPI is as follows:  Alcoholic liver disease   Hypertension  Encephalopathy  Depression with anxiety  Cirrhosis of liver without ascites    She was advised then as follows:  - history of alcoholism, with resulting alcoholic liver disease and encephalopathy: pt is doing well. She is taking lactulose up to 3 times a day and having 3 soft BM's daily. She is referred for annual Liver US and results revealed cirrhotic liver morphology, unchanged lesion in the hepatic lobe likely representing a hepatic calcification and no hepatic lesions were identified.  She was to schedule follow up appt with the liver clinic and she was seen in October/2020.  See below for details on this visit.   LFTs done in October/2020 revealed total bilirubin 1.9, normal AST/ALT and alk phos elevation of 167.  PT/INR, WNL and CBC revealed hematocrit of 47 and MCV of 103 with normal white blood cell and platelet counts.   - HTN: good control on present med regimen. She is counseled on low salt intake and home BP monitoring.  Fasting lipid panel done in October/2020 was within normal limits with HDL of 87 and LDL of 72.  CMP result done at that time revealed normal serum electrolytes, creatinine and glucose level.  - depression with anxiety: stable on lexapro 20 mg daily.    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     GI/liver encounter from October/2020 as noted below:   Assessment   ASSESSMENT:                                               [name] is a 74 yo female with liver disease from ASH/NASH.  She is committed to her sobriety and feels she has good family support.   She should continue her strengthen exercises and improved nutrition.   MELD is low.   ??Plan   PLAN:                                                              -This patient was seen and reviewed with Dr. Ruffin Frederick  -Labs reviewed   -2G Na+, higher protein diet  - titrate lactulose to 2-3 BMs per day  -HCC screening due, ordered, prefers to have at [facility]   -RTC in 6 months, sooner if needed.       The patient came to the appointment with her husband.    Medications, allergies, medical history, surgical history, family history,  social history, and previous hospitalization history were reviewed and updated in EMR today.    Vitals and BMI were reviewed.     Preventative health screening tests and immunization records were reviewed. Appropriate age related screening tests were ordered today if patient is due and agreeable to have tests completed.  See assessment/plan.    Current Providers:     Patient Care Team:  Jenell Milliner, MD as PCP - General  Jenell Milliner, MD as PCP - [facility]    Health Risk Assessment (HRA):     Balance and gait assessment:  Timed Get up an Go to perform walk a distance of 10 ft.: 8seconds. Her gait is tentative and she has bilateral knee pain due to DJD. Results discussed with patient. If abnormal, provider notified.      Normative Reference Values by Age   Time in Seconds    36 ??? 69 years  8.1 (7.1 ??? 9.0)    70 ??? 79 years  9.2 (8.2 ??? 10.2)    80 ??? 99 years  11.3 (10.0 ??? 12.7)      Cognitive Assessment:    Mini-Cog score: 3 words recalled, with normal clock drawing    Health Literacy: How confident are you that you understand your health issues/concerns, can participate in your care, and manage your care along with your physician: confident.    Safety & Fall Risk:    1) Any falls in the past year? No    2) Any use of assistive devices to ambulate? No    Current Suppliers (DME): none      Functional ability evaluation:    1) Does patient need assistance with ADLs, managing finances, remembering to take medications, using the phone, shopping for food, making meals, and performing housework? No    Educated patient on the importance of carrying a medication list with them at all times.    2) Is patient is incontinent of urine or stool? No    3) Does the patient still drive? No  If yes, are you having difficulties driving?  Not Applicable.    Does the patient fasten their seat belt?Other.    Hearing loss screen:    1)  Hearing Test Finger Rub Test:  normal    2) Does pt wear hearing aids? No    3)Does patient have to strain or struggle to hear/understand conversations? No    Exercise and Nutrition:     1) Has patient had any  unplanned weight loss in the last 3 months? No    2) Does the patient exercise and if yes, how often? no    3) Does patient have a pressure ulcer or non healing wound? No    4) Would patient like to be referred to nutrition if they have a calculated body mass index < 18.5 or > 30? No    Estimated body mass index is 40.39 kg/m?? as calculated from the following:    Height as of this encounter: 160 cm (5' 3).    Weight as of this encounter: 103.4 kg (228 lb).  Facility age limit for growth percentiles is 20 years.      Health maintenance:  Mammogram: 08/2017; patient referred  Bone density screening: not covered by Medicare  Colonoscopy: declined by the pt submitted Cologaurd last week and awaiting results  Influenza vaccine: Up-to-date 2020  Hep A and hep B series: Completed 2019  Pneumovax: 2019  Prevnar: Administered today  COVID-19 vaccine: Recommended  Shingrix: Series completed, 2020  Eye exam:  Pt to schedule in the near future  Breast  exam: performed today    End of life planning:  She is planning to address this ASAP    07/18/2019    Jenell Milliner, MD     The patient reports having no living will. ACP form given to the pt to review, complete and return scan into EMR         Psychosocial risks:     Social history updated and reviewed.    Past history of Psychiatric illness: depression    PHQ-9: improved    Discussed and reviewed results with the patient.     Physical exam  Vital signs as recorded in EMR  Neck exam: no masses, carotid pulses without bruits  Lung exam: clear  Cardiac exam: RRR nl s1 and s2  Breast exam: no masses or nipple discharge  Abdominal exam: non tender, no HSM or masses palpated  Extremity exam: no edema, distal pulses are normal         Jenell Milliner  07/18/2019

## 2019-07-18 ENCOUNTER — Encounter: Admit: 2019-07-18 | Discharge: 2019-07-19 | Payer: MEDICARE

## 2019-07-18 DIAGNOSIS — Z0000 Encounter for general adult medical examination without abnormal findings: Principal | ICD-10-CM

## 2019-07-18 DIAGNOSIS — F418 Other specified anxiety disorders: Principal | ICD-10-CM

## 2019-07-18 DIAGNOSIS — Z1231 Encounter for screening mammogram for malignant neoplasm of breast: Principal | ICD-10-CM

## 2019-07-18 LAB — THYROID STIMULATING HORMONE: Thyrotropin:ACnc:Pt:Ser/Plas:Qn:: 3.057

## 2019-07-18 NOTE — Unmapped (Signed)
07/18/2019    [facility] Components:     Family, social, cultural characteristics: none disclosed .    Patient has the following communication needs: none disclosed   Health Literacy: How confident are you that you understand your health issues/concerns, can participate in your care, and manage your care along with your physician: confident.  Behaviors Affecting Health: none, per patient  Family history of mental health illness and/or substance abuse: asked patient/parent and none disclosed.  Have you been seen by any medical provider that we have not referred you to since your last visit ? No  Discussed a Living Will with the patient and [id number] in education materials about a Living Will. Materials provided at today's visit.

## 2019-07-19 NOTE — Unmapped (Signed)
TSH is normal at 3.0.

## 2019-08-26 MED ORDER — POTASSIUM CHLORIDE ER 20 MEQ TABLET,EXTENDED RELEASE(PART/CRYST)
ORAL_TABLET | Freq: Two times a day (BID) | ORAL | 1 refills | 90.00000 days | Status: CP
Start: 2019-08-26 — End: 2020-08-25

## 2019-09-06 DIAGNOSIS — G9340 Encephalopathy, unspecified: Principal | ICD-10-CM

## 2019-09-06 MED ORDER — LACTULOSE 10 GRAM/15 ML ORAL SOLUTION
1 refills | 0 days | Status: CP
Start: 2019-09-06 — End: ?

## 2019-10-17 MED ORDER — MAGNESIUM OXIDE 400 MG (241.3 MG MAGNESIUM) TABLET
ORAL_TABLET | Freq: Two times a day (BID) | ORAL | 0 refills | 90.00000 days | Status: CP
Start: 2019-10-17 — End: ?

## 2019-10-17 NOTE — Unmapped (Signed)
Refill mag ox

## 2019-10-21 MED ORDER — FOLIC ACID 1 MG TABLET
ORAL_TABLET | 1 refills | 0 days | Status: CP
Start: 2019-10-21 — End: ?

## 2019-11-06 NOTE — Unmapped (Signed)
Assessment/Plan:    [name] was seen today for follow-up.    Diagnoses and all orders for this visit:    Alcoholic liver disease (CMS-HCC)  -     Comprehensive Metabolic Panel    Hypertension, unspecified type  -     Comprehensive Metabolic Panel    Depression with anxiety    Obesity, unspecified classification, unspecified obesity type, unspecified whether serious comorbidity present    Macrocytosis    Osteoarthritis, unspecified osteoarthritis type, unspecified site      - history of alcoholism, with resulting alcoholic liver disease and encephalopathy: pt is doing well. She is taking lactulose up to 3 times a day and having 3 soft BM's daily. She was referred for annual Liver US done 03/2019 and results revealed cirrhotic liver morphology, unchanged lesion in the hepatic lobe likely representing a hepatic calcification and no hepatic lesions were identified.  She was to schedule follow up appt with the liver clinic and she was seen in October/2020.  See below for details on this visit.   LFTs done in October/2020 revealed total bilirubin 1.9, normal AST/ALT and alk phos elevation of 167.  PT/INR, WNL and CBC revealed hematocrit of 47 and MCV of 103 with normal white blood cell and platelet counts.  I will check a CMP today.   - HTN: good control on present med regimen. She is counseled on low salt intake and home BP monitoring.  Fasting lipid panel done in October/2020 was within normal limits with HDL of 87 and LDL of 72.  CMP result done at that time revealed normal serum electrolytes, creatinine and glucose level. Repeat fasting CMP today.   - depression with anxiety: stable on lexapro 20 mg daily.  TSH was normal in February/2021.  - DJD- trial of OTC Voltaren gel and otc tylenol prn.   - Obesity: pt has advised on caloric restriction to promote weight loss.  She is not interested in referral [facility] Weight management today.  TSH was normal in 07/2019.      Return in about 4 months (around 03/15/2020) for Annual physical.    Subjective:     HPI  The patient is seen today for routine follow-up visit.  I last saw the patient February/2021 for Medicare annual wellness visit.  HerHPI is as follows:  Alcoholic liver disease   Hypertension  Encephalopathy  Depression with anxiety  Cirrhosis of liver without ascites    She was advised then as follows:  - history of alcoholism, with resulting alcoholic liver disease and encephalopathy: pt is doing well. She is taking lactulose up to 3 times a day and having 3 soft BM's daily. She was referred for annual Liver US done 03/2019 and results revealed cirrhotic liver morphology, unchanged lesion in the hepatic lobe likely representing a hepatic calcification and no hepatic lesions were identified.  She was to schedule follow up appt with the liver clinic and she was seen in October/2020.  See below for details on this visit.   LFTs done in October/2020 revealed total bilirubin 1.9, normal AST/ALT and alk phos elevation of 167.  PT/INR, WNL and CBC revealed hematocrit of 47 and MCV of 103 with normal white blood cell and platelet counts.   - HTN: good control on present med regimen. She is counseled on low salt intake and home BP monitoring.  Fasting lipid panel done in October/2020 was within normal limits with HDL of 87 and LDL of 72.  CMP result done at that time revealed  normal serum electrolytes, creatinine and glucose level.  - depression with anxiety: stable on lexapro 20 mg daily.  TSH was normal in February/2021.  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   - Obesity: pt has advised on caloric restriction to promote weight loss.  She is not interested in referral [facility] Weight management today.  TSH was normal in 07/2019.    GI/liver encounter from October/2020 as noted below:   Assessment   ASSESSMENT:                                               [name] is a 74 yo female with liver disease from ASH/NASH.   She is committed to her sobriety and feels she has good family support. She should continue her strengthen exercises and improved nutrition.   MELD is low.   ??Plan   PLAN:                                                              -This patient was seen and reviewed with Dr. Ruffin Frederick  -Labs reviewed   -2G Na+, higher protein diet  - titrate lactulose to 2-3 BMs per day  -HCC screening due, ordered, prefers to have at [facility].   -RTC in 6 months, sooner if needed.   The pt's daughter is in the process of scheduling this appt in the near future.    ROS  Constitutional:  Denies  unexpected weight loss or gain, or weakness   Eyes:  Denies visual changes  Respiratory:  Denies cough or shortness of breath. No change in exercise  tolerance  Cardiovascular:  Denies chest pain, palpitations or lower extremity swelling   GI:  Denies abdominal pain, diarrhea, constipation   Musculoskeletal:  Denies myalgias  Skin:  Denies nonhealing lesions  Neurologic:  Denies headache, focal weakness or numbness, tingling  Endocrine:  Denies polyuria or polydypsia   Psychiatric:  Denies depression, anxiety      Outpatient Medications Prior to Visit   Medication Sig Dispense Refill   ??? acetaminophen (TYLENOL) 325 MG tablet Take 2 tablets (650 mg total) by mouth every six (6) hours as needed for pain.  0   ??? carvediloL (COREG) 3.125 MG tablet Take 1 tablet (3.125 mg total) by mouth Two (2) times a day. 180 tablet 1   ??? cyanocobalamin 1000 MCG tablet Take 1,000 mcg by mouth daily.     ??? escitalopram oxalate (LEXAPRO) 20 MG tablet Take 1 tablet (20 mg total) by mouth daily. 90 tablet 1   ??? folic acid (FOLVITE) 1 MG tablet TAKE 1 TABLET(1 MG) BY MOUTH DAILY 90 tablet 1   ??? lactulose (CHRONULAC) 10 gram/15 mL solution Take 15 mL (10 g total) by mouth Three (3) times a day. Take enough to have 3 BM's per day 4050 mL 1   ??? magnesium oxide (MAG-OX) 400 mg (241.3 mg magnesium) tablet Take 1 tablet (400 mg total) by mouth Two (2) times a day. 180 tablet 0   ??? potassium chloride (KLOR-CON) 20 MEQ CR tablet Take 1 tablet (20 mEq total) by mouth Two (2) times a day. 180 tablet 1   ???  thiamine mononitrate, vit B1, (VITAMIN B-1, MONONITRATE,) 100 mg Tab tablet Take 2.5 tablets (250 mg total) by mouth daily. (Patient taking differently: Take 250 mg by mouth daily. 200 mg daily) 225 tablet 1   ??? oxymetazoline (AFRIN) 0.05 % nasal spray 2 sprays two (2) times a day as needed.  (Patient not taking: Reported on 11/14/2019)       No facility-administered medications prior to visit.         Objective:       Vital Signs  BP 142/82  - Pulse 74  - Temp 36.7 ??C (98 ??F) (Oral)  - Ht 160 cm (5' 3)  - Wt (!) 104.8 kg (231 lb)  - SpO2 98%  - BMI 40.92 kg/m??      Exam  General: normal appearance  EYES: Anicteric sclerae.  ENT: Oropharynx moist.  RESP: Relaxed respiratory effort. Clear to auscultation without wheezes or crackles.   CV: Regular rate and rhythm. Normal S1 and S2. No murmurs or gallops.  No lower extremity edema. Posterior tibial pulses are 2+ and symmetric.  abd exam: non tender, no masses, no HSM   MSK: No focal muscle tenderness.  SKIN: Appropriately warm and moist.  NEURO: Stable gait and coordination.    Allergies:     Sulfa (sulfonamide antibiotics), Sulfur, and Aspirin    Current Medications:     Current Outpatient Medications   Medication Sig Dispense Refill   ??? acetaminophen (TYLENOL) 325 MG tablet Take 2 tablets (650 mg total) by mouth every six (6) hours as needed for pain.  0   ??? carvediloL (COREG) 3.125 MG tablet Take 1 tablet (3.125 mg total) by mouth Two (2) times a day. 180 tablet 1   ??? cyanocobalamin 1000 MCG tablet Take 1,000 mcg by mouth daily.     ??? escitalopram oxalate (LEXAPRO) 20 MG tablet Take 1 tablet (20 mg total) by mouth daily. 90 tablet 1   ??? folic acid (FOLVITE) 1 MG tablet TAKE 1 TABLET(1 MG) BY MOUTH DAILY 90 tablet 1   ??? lactulose (CHRONULAC) 10 gram/15 mL solution Take 15 mL (10 g total) by mouth Three (3) times a day. Take enough to have 3 BM's per day 4050 mL 1   ??? magnesium oxide (MAG-OX) 400 mg (241.3 mg magnesium) tablet Take 1 tablet (400 mg total) by mouth Two (2) times a day. 180 tablet 0   ??? potassium chloride (KLOR-CON) 20 MEQ CR tablet Take 1 tablet (20 mEq total) by mouth Two (2) times a day. 180 tablet 1   ??? thiamine mononitrate, vit B1, (VITAMIN B-1, MONONITRATE,) 100 mg Tab tablet Take 2.5 tablets (250 mg total) by mouth daily. (Patient taking differently: Take 250 mg by mouth daily. 200 mg daily) 225 tablet 1     No current facility-administered medications for this visit.           Note - This record has been created using [employer]. Chart creation errors have been sought, but may not always have been located. Such creation errors do not reflect on the standard of medical care.    Jenell Milliner, MD

## 2019-11-14 ENCOUNTER — Encounter: Admit: 2019-11-14 | Discharge: 2019-11-15 | Payer: MEDICARE

## 2019-11-14 DIAGNOSIS — M1990 Unspecified osteoarthritis, unspecified site: Secondary | ICD-10-CM | POA: Insufficient documentation

## 2019-11-14 DIAGNOSIS — D7589 Other specified diseases of blood and blood-forming organs: Secondary | ICD-10-CM | POA: Insufficient documentation

## 2019-11-14 DIAGNOSIS — I10 Essential (primary) hypertension: Principal | ICD-10-CM

## 2019-11-14 DIAGNOSIS — K709 Alcoholic liver disease, unspecified: Principal | ICD-10-CM

## 2019-11-14 DIAGNOSIS — E669 Obesity, unspecified: Principal | ICD-10-CM

## 2019-11-14 DIAGNOSIS — F418 Other specified anxiety disorders: Principal | ICD-10-CM

## 2019-11-14 LAB — GLUCOSE RANDOM: Glucose:MCnc:Pt:Ser/Plas:Qn:: 96

## 2019-11-14 LAB — COMPREHENSIVE METABOLIC PANEL
ALBUMIN: 3.7 g/dL (ref 3.5–5.0)
ALKALINE PHOSPHATASE: 132 U/L — ABNORMAL HIGH (ref 38–126)
ALT (SGPT): 14 U/L (ref <35)
ANION GAP: 6 mmol/L — ABNORMAL LOW (ref 7–15)
AST (SGOT): 34 U/L (ref 14–38)
BILIRUBIN TOTAL: 1.7 mg/dL — ABNORMAL HIGH (ref 0.0–1.2)
CALCIUM: 9 mg/dL (ref 8.5–10.2)
CHLORIDE: 109 mmol/L — ABNORMAL HIGH (ref 98–107)
CO2: 23 mmol/L (ref 22.0–30.0)
CREATININE: 0.41 mg/dL — ABNORMAL LOW (ref 0.60–1.00)
EGFR CKD-EPI AA FEMALE: >90 mL/min/{1.73_m2} (ref >=60)
EGFR CKD-EPI NON-AA FEMALE: >90 mL/min/{1.73_m2} (ref >=60)
GLUCOSE RANDOM: 96 mg/dL (ref 70–179)
POTASSIUM: 4.7 mmol/L (ref 3.5–5.0)
PROTEIN TOTAL: 7 g/dL (ref 6.5–8.3)
SODIUM: 138 mmol/L (ref 135–145)

## 2019-11-14 NOTE — Unmapped (Signed)
CMP with stable LFT's.

## 2019-12-02 DIAGNOSIS — F418 Other specified anxiety disorders: Principal | ICD-10-CM

## 2019-12-02 MED ORDER — ESCITALOPRAM 20 MG TABLET
ORAL_TABLET | Freq: Every day | ORAL | 1 refills | 90.00000 days | Status: CP
Start: 2019-12-02 — End: 2020-12-01

## 2019-12-02 NOTE — Unmapped (Signed)
Requested Prescriptions     Pending Prescriptions Disp Refills   ??? escitalopram oxalate (LEXAPRO) 20 MG tablet [Pharmacy Med Name: ESCITALOPRAM 20MG  TABLETS] 90 tablet 1     Sig: TAKE 1 TABLET(20 MG) BY MOUTH DAILY    last seen 1/21 last refill given 05/25/2019 90 tab 1 refill

## 2019-12-03 DIAGNOSIS — K766 Portal hypertension: Principal | ICD-10-CM

## 2019-12-03 DIAGNOSIS — I10 Essential (primary) hypertension: Principal | ICD-10-CM

## 2019-12-03 MED ORDER — CARVEDILOL 3.125 MG TABLET
ORAL_TABLET | 1 refills | 0 days
Start: 2019-12-03 — End: ?

## 2019-12-05 MED ORDER — CARVEDILOL 3.125 MG TABLET
ORAL_TABLET | Freq: Two times a day (BID) | ORAL | 1 refills | 90 days | Status: CP
Start: 2019-12-05 — End: 2020-12-04

## 2020-01-20 MED ORDER — MAGNESIUM OXIDE 400 MG (241.3 MG MAGNESIUM) TABLET
ORAL_TABLET | 0 refills | 0 days | Status: CP
Start: 2020-01-20 — End: ?

## 2020-02-24 MED ORDER — POTASSIUM CHLORIDE ER 20 MEQ TABLET,EXTENDED RELEASE(PART/CRYST)
ORAL_TABLET | 1 refills | 0 days | Status: CP
Start: 2020-02-24 — End: ?

## 2020-02-24 NOTE — Unmapped (Signed)
Pt request refill of klor-con last filled 08/26/19  Last visit 11/14/19

## 2020-02-28 MED ORDER — FOLIC ACID 1 MG TABLET
ORAL_TABLET | Freq: Every day | ORAL | 1 refills | 90 days | Status: CP
Start: 2020-02-28 — End: 2021-02-27

## 2020-02-28 NOTE — Unmapped (Signed)
refill 

## 2020-03-05 DIAGNOSIS — F418 Other specified anxiety disorders: Principal | ICD-10-CM

## 2020-03-05 MED ORDER — ESCITALOPRAM 20 MG TABLET
ORAL_TABLET | 1 refills | 0 days
Start: 2020-03-05 — End: ?

## 2020-03-06 MED ORDER — ESCITALOPRAM 20 MG TABLET
ORAL_TABLET | 1 refills | 0 days | Status: CP
Start: 2020-03-06 — End: ?

## 2020-03-06 NOTE — Unmapped (Signed)
Patient is requesting the following refill  Requested Prescriptions     Pending Prescriptions Disp Refills   ??? escitalopram oxalate (LEXAPRO) 20 MG tablet [Pharmacy Med Name: ESCITALOPRAM 20MG  TABLETS] 90 tablet 1     Sig: TAKE 1 TABLET(20 MG) BY MOUTH DAILY       Last OV: 11/14/2019    Next OV: 04/23/2020.

## 2020-04-04 DIAGNOSIS — Z1231 Encounter for screening mammogram for malignant neoplasm of breast: Principal | ICD-10-CM

## 2020-04-16 MED ORDER — MAGNESIUM OXIDE 400 MG (241.3 MG MAGNESIUM) TABLET
ORAL_TABLET | Freq: Two times a day (BID) | ORAL | 1 refills | 90 days | Status: CP
Start: 2020-04-16 — End: 2021-04-16

## 2020-04-16 NOTE — Unmapped (Deleted)
[name] is a 75 y.o. female being seen for a comprehensive physical exam/pt has medicare advantage plan.      HPI:  I saw the pt in 11/2019 with the following HPI:  Alcoholic liver disease   Hypertension  Depression with anxiety  Obesity  Macrocytosis  Osteoarthritis    She was advised then as follows:  - history of alcoholism, with resulting alcoholic liver disease and encephalopathy: pt is doing well. She is taking lactulose up to 3 times a day and having 3 soft BM's daily. She was referred for annual Liver US done 03/2019 and results revealed cirrhotic liver morphology, unchanged lesion in the hepatic lobe likely representing a hepatic calcification and no hepatic lesions were identified.  She was to schedule follow up appt with the liver clinic and she was seen in October/2020.  LFTs done in 11/2019 revealed normal AST/ALT, improved alk phos level at 132 and improved total bilirubin 1.7. PT/INR, WNL and CBC revealed hematocrit of 47 and MCV of 103 with normal white blood cell and platelet counts in 03/2019.  She is due for repeat PT/INR and CBC today.   - HTN: good control on present med regimen. She is counseled on low salt intake and home BP monitoring.  Fasting lipid panel done in October/2020 was within normal limits with HDL of 87 and LDL of 72.   Repeat fasting lipid panel  today.   - depression with anxiety: stable on lexapro 20 mg daily.  TSH was normal in February/2021.  - DJD- trial of OTC Voltaren gel and otc tylenol prn.   - Obesity: pt has advised on caloric restriction to promote weight loss.  She is not interested in referral [facility] Weight management today.  TSH was normal in 07/2019.            Current Outpatient Medications   Medication Sig Dispense Refill   ??? acetaminophen (TYLENOL) 325 MG tablet Take 2 tablets (650 mg total) by mouth every six (6) hours as needed for pain.  0   ??? carvediloL (COREG) 3.125 MG tablet Take 1 tablet (3.125 mg total) by mouth Two (2) times a day. 180 tablet 1   ??? cyanocobalamin 1000 MCG tablet Take 1,000 mcg by mouth daily.     ??? escitalopram oxalate (LEXAPRO) 20 MG tablet TAKE 1 TABLET(20 MG) BY MOUTH DAILY 90 tablet 1   ??? folic acid (FOLVITE) 1 MG tablet Take 1 tablet (1 mg total) by mouth daily. 90 tablet 1   ??? lactulose (CHRONULAC) 10 gram/15 mL solution Take 15 mL (10 g total) by mouth Three (3) times a day. Take enough to have 3 BM's per day 4050 mL 1   ??? magnesium oxide (MAG-OX) 400 mg (241.3 mg elemental magnesium) tablet TAKE 1 TABLET(400 MG) BY MOUTH TWICE DAILY 180 tablet 0   ??? potassium chloride (KLOR-CON) 20 MEQ CR tablet TAKE 1 TABLET(20 MEQ) BY MOUTH TWICE DAILY 180 tablet 1     No current facility-administered medications for this visit.       I have reviewed PMH, Allergies, SH, and Meds within Epic at today's clinic visit.   ROS:     General: no fatigue, unexpected weight loss or gain, overall feels well  ENT: denies visual problems ear symptoms, thoat symptoms, nasal congestion,   Cardiovascular: denies chest pain, palpitations, tachycardia, lower extremity swelling, claudication  Respiratory: denies dyspnea, dyspnea on exertion, wheezing, cough  Gastrointestinal: denies nausea, vomiting, dyspepsia, dysphagia, chronic constipation, diarrhea, melana, hematochezia  GU:No abnormal vaginal discharge, pelvic pain, irregular menses, vasomotor symptoms  Musculoskeletal: denies joint pains, muscle pain or weakness  Integumentary: denies rashes, or other skin lesions  Neurological: denies headaches, dizziness, numbness, tingling, syncope  Psych: Denies symptoms suggesting anxiety, depression, sleep disturbance    Health Maintenance:   TDap Vaccine-   Covid 19 vaccine-   Influenza vaccine-  Pap/Pelvic Exam-  Mammogram- referral submitted 04/04/2020  BDS-  Colonoscopy-  Pneumovax- 2019  Prevnar-07/2019  ShingRix- 2020      Physical Exam:  There were no vitals taken for this visit.  Gen: well nourished, well appearing, in no acute distress. alert and oriented  to person, place, and time.  HEENT:  normocephalic atraumatic, mucous membranes moist, extraocular muscles intact, pupils equally round and reactive to light and accommodation bilaterally, bilateral tympanic membranes intact and reactive to light, bilateral sclera anicteric, no conjunctival injection.    Neck: no cervical lymphadenopathy, thyromegaly or JVD present  Heart: regular rate and rhythm, S1 and S2 are normal, no  murmurs/rubs/or gallops  Lungs: clear to auscultation bilaterally, no rales/rhonchi/wheezes  Abd:  Normoactive bowel sounds, abdomen soft, non-tender and not distended, no hepatosplenomegaly or masses. No rebound or guarding.   Breast:  No palpable masses, skin dimpling, rash, discharge or lymphadenopathy present.  GU:  deferred  Musco:  no joint tenderness, deformity, effusions. Full range of motion in shoulders, elbows, wrists, and hands.    Neuro: CN 3-12 grossly intact, normal cerebellar function, normal gait. no focal deficits.  Skin: no visible lesions or rashes      PHQ-2 Score:       ASCVD risk:  The 10-year ASCVD risk score Denman George DC Jr., et al., 2013) is: 19.8%    Values used to calculate the score:      Age: 89 years      Sex: Female      Is Non-Hispanic African American: No      Diabetic: No      Tobacco smoker: No      Systolic Blood Pressure: 142 mmHg      Is BP treated: Yes      HDL Cholesterol: 87 mg/dL      Total Cholesterol: 175 mg/dL    Note: For patients with SBP <90 or >200, Total Cholesterol <130 or >320, HDL <20 or >100 which are outside of the allowable range, the calculator will use these upper or lower values to calculate the patient???s risk score.          A/P:  1.  Annual Exam   -Preventive Services      - Counseled pt on exercise, diet, and weight      - Recommended yearly dental and vision screenings.      - Vaccines-      -  Health Maintenance referrals-    -  Annual labs- No orders of the defined types were placed in this encounter.  The pt is due for CBC, lipids, PT/INR . She had recent CMP and TSH early this year.     2.  ---  Medications reviewed with the pt  Follow up-        Note - This record has been created using [employer]. Chart creation errors have been sought, but may not always have been located. Such creation errors do not reflect on the standard of medical care.    Jenell Milliner, MD

## 2020-04-24 DIAGNOSIS — G9340 Encephalopathy, unspecified: Principal | ICD-10-CM

## 2020-04-24 MED ORDER — LACTULOSE 10 GRAM/15 ML ORAL SOLUTION
1 refills | 0 days | Status: CP
Start: 2020-04-24 — End: 2020-04-30

## 2020-04-24 NOTE — Unmapped (Signed)
Patient is requesting the following refill  Requested Prescriptions     Pending Prescriptions Disp Refills   ??? lactulose (CHRONULAC) 10 gram/15 mL solution [Pharmacy Med Name: LACTULOSE 10GM/15ML SOLUTION] 946 mL 1     Sig: TAKE 15 ML BY MOUTH THREE TIMES DAILY. TAKE ENOUGH TO HAVE 3 BOWEL MOVEMENT PER DAY       Last OV: 11/14/2019    Next OV: Visit date not found.

## 2020-04-29 DIAGNOSIS — G9340 Encephalopathy, unspecified: Principal | ICD-10-CM

## 2020-04-29 MED ORDER — LACTULOSE 10 GRAM/15 ML ORAL SOLUTION
0 refills | 0 days
Start: 2020-04-29 — End: ?

## 2020-04-30 DIAGNOSIS — G9340 Encephalopathy, unspecified: Principal | ICD-10-CM

## 2020-04-30 MED ORDER — LACTULOSE 10 GRAM/15 ML ORAL SOLUTION: mL | 3 refills | 0 days | Status: AC

## 2020-04-30 MED ORDER — LACTULOSE 10 GRAM/15 ML ORAL SOLUTION
ORAL | 0 refills | 0.00000 days | Status: CP
Start: 2020-04-30 — End: 2020-04-30

## 2020-04-30 NOTE — Unmapped (Signed)
Patient is requesting the following refill  Requested Prescriptions     Pending Prescriptions Disp Refills   ??? lactulose (CHRONULAC) 10 gram/15 mL solution [Pharmacy Med Name: LACTULOSE 10GM/15ML SOLUTION]  0     Sig: TAKE 15 ML BY MOUTH THREE TIMES DAILY, TAKE ENOUGH TO HAVE 3 BOWEL MOVEMENT PER DAY       Last OV: 11/14/2019    Next OV: Visit date not found.

## 2020-06-01 DIAGNOSIS — F418 Other specified anxiety disorders: Principal | ICD-10-CM

## 2020-06-01 MED ORDER — ESCITALOPRAM 20 MG TABLET
ORAL_TABLET | 1 refills | 0 days
Start: 2020-06-01 — End: ?

## 2020-06-03 NOTE — Unmapped (Signed)
Patient is requesting the following refill  Requested Prescriptions     Pending Prescriptions Disp Refills   ??? escitalopram oxalate (LEXAPRO) 20 MG tablet [Pharmacy Med Name: ESCITALOPRAM 20MG  TABLETS] 90 tablet 1     Sig: Take 1 tablet (20 mg total) by mouth daily. TAKE 1 TABLET(20 MG) BY MOUTH DAILY       Last OV: 11/14/2019     Last Virtual Visit: Visit date not found     Next OV: Visit date not found.     Labs: Not applicable this refill

## 2020-06-04 MED ORDER — ESCITALOPRAM 20 MG TABLET
ORAL_TABLET | Freq: Every day | ORAL | 1 refills | 90 days | Status: CP
Start: 2020-06-04 — End: 2021-06-04

## 2020-06-08 NOTE — Unmapped (Signed)
Abstraction Result Flowsheet Data    This patient's last AWV date: [facility] Last Medicare Wellness Visit Date: 07/18/2019  This patients last WCC/CPE date: : Not Found      Reason for Encounter  Reason for Encounter: Outreach  Primary Reason for Call: AWV  Outreach Call Outcome: Left message  Text Message: No

## 2020-06-20 ENCOUNTER — Encounter: Admit: 2020-06-20 | Discharge: 2020-06-20 | Payer: MEDICARE

## 2020-06-20 DIAGNOSIS — Z1231 Encounter for screening mammogram for malignant neoplasm of breast: Principal | ICD-10-CM

## 2020-06-21 NOTE — Unmapped (Signed)
Mammogram result reviewed.  Patient to be contacted by radiology for recommended follow-up imaging.

## 2020-06-22 DIAGNOSIS — R928 Other abnormal and inconclusive findings on diagnostic imaging of breast: Principal | ICD-10-CM

## 2020-07-23 ENCOUNTER — Encounter: Admit: 2020-07-23 | Discharge: 2020-08-21 | Payer: MEDICARE

## 2020-07-23 ENCOUNTER — Encounter: Admit: 2020-07-23 | Discharge: 2020-07-24 | Payer: MEDICARE

## 2020-07-23 DIAGNOSIS — R928 Other abnormal and inconclusive findings on diagnostic imaging of breast: Principal | ICD-10-CM

## 2020-07-25 ENCOUNTER — Encounter: Admit: 2020-07-25 | Discharge: 2020-07-26 | Payer: MEDICARE

## 2020-07-25 DIAGNOSIS — R928 Other abnormal and inconclusive findings on diagnostic imaging of breast: Principal | ICD-10-CM

## 2020-07-25 NOTE — Unmapped (Signed)
Patient Name: [name]  Patient Age: 75 y.o.  Encounter Date: 07/25/2020    Referring Physician:   Jenell Milliner, MD  [address]  [facility]  [place],  [place] [address]    Primary Care Provider:  Jenell Milliner, MD    Supervising Physician  Dr. Debbrah Alar      Reason for Visit:   Chief Complaint   Patient presents with   ??? New Problem       HPI:    [name] is a 75 y.o. female who is seen in consultation at the request of Jenell Milliner, MD for abnormal mammogram.  The patient's medical record has been reviewed and the patient is interviewed.  She had been having routine annual mammograms up until Covid came and then resumed imaging.  She had an abnormal screen for which she had call back and diagnostic work-up with a right diagnostic on February 21.  Upon further evaluation of the asymmetry ultrasound revealed an irregular hypoechoic mass with indistinct margins 5 o'clock position 8 cm from the nipple measuring 7 mm in greatest dimension considered a BI-RADS 4C.    Patient's past medical history is notable for hypertension degenerative joint disease history of alcoholism alcoholic liver disease.    She herself has not noticed any changes on her breast exam.  She has no family history for breast cancer.    Review of Systems:  Is notable for fatigue and arthritic complaints as well as some anxiety regarding the current situation.  Otherwise remaining 10 systems are negative    Reproductive History:  Patient is postmenopausal she is a G2, P2    Medical History:  Past Medical History:   Diagnosis Date   ??? Alcoholism (CMS-HCC)    ??? Alcoholism /alcohol abuse    ??? Cirrhosis (CMS-HCC)    ??? Depression    ??? Hypertension    ??? Liver disease        Surgical History:  Past Surgical History:   Procedure Laterality Date   ??? BREAST BIOPSY Right     benign-a long time ago   ??? CHOLECYSTECTOMY     ??? PR UPPER GI ENDOSCOPY,BIOPSY N/A 02/03/2018    Procedure: UGI ENDOSCOPY; WITH BIOPSY, SINGLE OR MULTIPLE;  Surgeon: Alfred Levins, MD;  Location: [facility];  Service: Gastroenterology   ??? TUBAL LIGATION         Family History:  Family History   Problem Relation Age of Onset   ??? Lupus Brother    ??? Heart disease Mother    ??? No Known Problems Father    ??? No Known Problems Sister    ??? No Known Problems Daughter    ??? No Known Problems Maternal Grandmother    ??? No Known Problems Maternal Grandfather    ??? No Known Problems Paternal Grandmother    ??? No Known Problems Paternal Grandfather    ??? BRCA 1/2 Neg Hx    ??? Breast cancer Neg Hx    ??? Cancer Neg Hx    ??? Colon cancer Neg Hx    ??? Endometrial cancer Neg Hx    ??? Ovarian cancer Neg Hx    ??? Mental illness Neg Hx    ??? Substance Abuse Disorder Neg Hx        Social History:  Tobacco use:   Social History     Tobacco Use   Smoking Status Never Smoker   Smokeless Tobacco Never  Used     Alcohol use:   Social History     Substance and Sexual Activity   Alcohol Use Not Currently     Drug use:   Social History     Substance and Sexual Activity   Drug Use Never     Living situation: Patient lives at home with her husband of 50 years.  Her daughter and granddaughter.  Denies alcohol intake at this time and no smoking history    Medications:     Current Outpatient Medications:   ???  acetaminophen (TYLENOL) 325 MG tablet, Take 2 tablets (650 mg total) by mouth every six (6) hours as needed for pain., Disp: , Rfl: 0  ???  carvediloL (COREG) 3.125 MG tablet, Take 1 tablet (3.125 mg total) by mouth Two (2) times a day., Disp: 180 tablet, Rfl: 1  ???  cyanocobalamin 1000 MCG tablet, Take 1,000 mcg by mouth daily., Disp: , Rfl:   ???  escitalopram oxalate (LEXAPRO) 20 MG tablet, Take 1 tablet (20 mg total) by mouth daily. TAKE 1 TABLET(20 MG) BY MOUTH DAILY, Disp: 90 tablet, Rfl: 1  ???  folic acid (FOLVITE) 1 MG tablet, Take 1 tablet (1 mg total) by mouth daily., Disp: 90 tablet, Rfl: 1  ???  lactulose (CHRONULAC) 10 gram/15 mL solution, TAKE 15 MLS BY MOUTH THREE TIMES DAILY, TAKE ENOUGH TO HAVE 3 BOWEL MOVEMENT PER DAY, Disp: 1350 mL, Rfl: 3  ???  magnesium oxide (MAG-OX) 400 mg (241.3 mg elemental magnesium) tablet, Take 1 tablet (400 mg total) by mouth Two (2) times a day., Disp: 180 tablet, Rfl: 1  ???  potassium chloride (KLOR-CON) 20 MEQ CR tablet, TAKE 1 TABLET(20 MEQ) BY MOUTH TWICE DAILY, Disp: 180 tablet, Rfl: 1    Allergies:  is allergic to sulfa (sulfonamide antibiotics), sulfur, and aspirin.          Exam:  BSA: 2.09 meters squared  BP (S) 186/94  - Pulse 89  - Temp 36.8 ??C (98.3 ??F) (Temporal)  - Resp 16  - Ht 157.5 cm (5' 2)  - Wt 100.3 kg (221 lb 3.2 oz)  - SpO2 99%  - BMI 40.46 kg/m??   Pain Assessment: Pain scale 0    General Appearance:  No acute distress, well appearing and well nourished.   Head:  Normocephalic, atraumatic.   Eyes:  Conjuctiva and lids appear normal. Pupils equal and round,   sclera anicteric.   Ears:  Overall appearance normal with no scars, lesions or               masses.  Hearing is grossly normal.   Nose: Not examined secondary to face mask for covid   Throat: Not examined secondary to face mask for covid   Breast:  Breast exam reveals large ptotic breast with everted nipples.  She has no skin change mass or nipple discharge bilateral   Axilla  no adenopathy in the axilla bilaterally   Neck:  Neck is supple trachea midline no JVD is supraclavicular adenopathy   Pulmonary:    Normal respiratory effort   Cardiovascular:  Regular rate and rhythm per vital signs   Musculoskeletal: Normal gait.  Extremities without clubbing, cyanosis, or           edema.   Psychiatric: Judgement and insight appropriate.  Oriented to person,         place,  and time.       Diagnostic Studies:  @MAMMOFINDINGS @  Per HPI  Assessment:  Abnormal right mammogram BI-RADS 4C    Plan:  The patient is assessed to be at average risk for the development of breast cancer.  Clinical exam is normal.  She will proceed with ultrasound-guided core biopsy next Monday on the 28th at [name].  She will be called with results and additional recommendations to follow      The note was transcribed by dragon and may contain errors in spelling

## 2020-07-30 ENCOUNTER — Encounter: Admit: 2020-07-30 | Discharge: 2020-07-31 | Payer: MEDICARE

## 2020-07-30 DIAGNOSIS — R928 Other abnormal and inconclusive findings on diagnostic imaging of breast: Principal | ICD-10-CM

## 2020-08-03 DIAGNOSIS — C50911 Malignant neoplasm of unspecified site of right female breast: Principal | ICD-10-CM

## 2020-08-07 NOTE — Unmapped (Signed)
Radiation Oncology Initial Visit Note    Patient Name: [name]  Patient Age: 74 y.o.  Encounter Date: 08/08/2020    Referring Physician:   Genia Del, ANP  [address]  [id number] Phys Of[address] Rene K[facility] [place],  [place] [address]    Primary Care Provider:  Jenell Milliner, MD    Assessment:   [name] is a 75 y.o. female with cT1bN0 right breast cancer (G2, ER/PR + / HER2 equivocal).    Recommendations:  [name] has a newly diagnosed early stage breast carcinoma. We reviewed the natural history of early stage breast cancer and the therapeutic options available. Patient also discussed treatment options with Dr. Tama Gander (surgical oncology). In general terms we reviewed the available treatment paradigms for management of early stage breast cancers. We specifically discussed mastectomy versus lumpectomy followed by radiation treatments.    After discussion with Dr. Tama Gander (surgical oncology), she has decided to pursue lumpectomy. We will await post-surgical pathology in order to determine the extent of post-lumpectomy radiation needed and if she will need chemotherapy prior to radiation. She states she is not interested in adjuvant endocrine therapy. [name] was given our contact information and encouraged to call with questions or concerns at any time.    PLAN  1. Plan to follow-up 4-6 weeks after surgery to schedule CT sim and discuss starting radiation, pending final path from surgery    --------------------------------------------------------------------------------------------------------------------------    History of Present Illness: Information pertinent to today's evaluation are the following:    The tumor was discovered as the result of an abnormal screening mammogram performed on 1/19 at [facility]. After her suspicious mammogram, she had a diagnostic mammogram on 2/21 followed by a right breast ultrasound with biopsy on 2/28. She denies associated symptoms including pain, breast mass, breast retraction, nipple inversion, breast swelling, nipple discharge, or weight loss. Of note, she has history of cirrhosis and takes lactulose TID.    Biopsy 07/30/20  Final Diagnosis   A: Breast, right, core biopsy  - Invasive ductal carcinoma (see comment)  - Nottingham combined histologic grade: 2               Tubule formation: 3               Nuclear grade: 2  Mitotic score: 1  - Invasive carcinoma measures approximately 3.5 mm in this specimen  - Solid papillary ductal carcinoma in situ  - Ancillary studies (see biomarker synoptic)               Estrogen receptor: Positive (100%)               Progesterone receptor: Positive (100%)               HER2 IHC: Equivocal (2+)  HER2 FISH: Insufficient cellularity       Mammo and [place] 07/23/20  -FINDINGS:  The patient was recalled for further evaluation of an asymmetry in the far posterior lower inner right breast. The asymmetry located in the lower inner quadrant persists with tomographic imaging. There is surrounding architectural distortion. Sonographic assessment revealed an irregular hypoechoic nonparallel mass with indistinct margins and peripheral vascularity at the 5:00 position approximately 8 cm from the nipple. This mass approximately measures 0.7 x 0.6 x 0.6 cm. This corresponds with the mammographic finding  -ASSESSMENT:  -BI-RADS Category: 4C     Prior radiation therapy: no  Pacemaker: no  Pregnancy status: Negative pregnancy test/infertile  Review of Systems:  A 10 systems was negative except for pertinent positives noted in HPI.     PAST MEDICAL HISTORY:  Past Medical History:   Diagnosis Date    Alcoholism (CMS-HCC)     Alcoholism /alcohol abuse     Cirrhosis (CMS-HCC)     Depression     Hypertension     Liver disease        FAMILY HISTORY:  Family History   Problem Relation Age of Onset    Lupus Brother     Heart disease Mother     No Known Problems Father     No Known Problems Sister     No Known Problems Daughter     No Known Problems Maternal Grandmother     No Known Problems Maternal Grandfather     No Known Problems Paternal Grandmother     No Known Problems Paternal Grandfather     BRCA 1/2 Neg Hx     Breast cancer Neg Hx     Cancer Neg Hx     Colon cancer Neg Hx     Endometrial cancer Neg Hx     Ovarian cancer Neg Hx     Mental illness Neg Hx     Substance Abuse Disorder Neg Hx        SOCIAL HISTORY:   Social History     Socioeconomic History    Marital status: Married     Spouse name: Not on file    Number of children: Not on file    Years of education: Not on file    Highest education level: Not on file   Occupational History    Not on file   Tobacco Use    Smoking status: Never Smoker    Smokeless tobacco: Never Used   Vaping Use    Vaping Use: Never used   Substance and Sexual Activity    Alcohol use: Not Currently    Drug use: Never    Sexual activity: Not on file   Other Topics Concern    Not on file   Social History Narrative    Not on file     Social Determinants of Health     Financial Resource Strain: Not on file   Food Insecurity: Not on file   Transportation Needs: Not on file   Physical Activity: Not on file   Stress: Not on file   Social Connections: Not on file       Allergies   Allergen Reactions    Sulfa (Sulfonamide Antibiotics) Anaphylaxis    Sulfur Anaphylaxis    Aspirin      thrombocytopenia       Current Medications:  Current Outpatient Medications   Medication Sig Dispense Refill    acetaminophen (TYLENOL) 325 MG tablet Take 2 tablets (650 mg total) by mouth every six (6) hours as needed for pain.  0    carvediloL (COREG) 3.125 MG tablet Take 1 tablet (3.125 mg total) by mouth Two (2) times a day. 180 tablet 1    cyanocobalamin 1000 MCG tablet Take 1,000 mcg by mouth daily.      escitalopram oxalate (LEXAPRO) 20 MG tablet Take 1 tablet (20 mg total) by mouth daily. TAKE 1 TABLET(20 MG) BY MOUTH DAILY 90 tablet 1    folic acid (FOLVITE) 1 MG tablet Take 1 tablet (1 mg total) by mouth daily. 90 tablet 1    lactulose (CHRONULAC) 10 gram/15 mL solution TAKE 15 MLS BY MOUTH  THREE TIMES DAILY, TAKE ENOUGH TO HAVE 3 BOWEL MOVEMENT PER DAY 1350 mL 3    magnesium oxide (MAG-OX) 400 mg (241.3 mg elemental magnesium) tablet Take 1 tablet (400 mg total) by mouth Two (2) times a day. 180 tablet 1    potassium chloride (KLOR-CON) 20 MEQ CR tablet TAKE 1 TABLET(20 MEQ) BY MOUTH TWICE DAILY 180 tablet 1     No current facility-administered medications for this visit.       Physical Exam:  Karnofsky Performance Status: 80, Normal activity with effort; some signs or symptoms of disease (ECOG equivalent 1)  General:  No acute distress, alert and oriented X 4  Neuro:  Normal Gait and Cognition. CN II-XII focally intact  HEENT: Moist mucous membranes  Neck: Supple, midline  Lungs: Normal work of breathing  Breast: Deferred  MSK: No joint pains or effusions  Psych: Normal mood and affect. Converses clearly and emotionally appropriate.    RADIOLOGY: Imaging was personally reviewed as detailed in the history (see above).    PATHOLOGY:  Pathology report was personally reviewed as detailed in the HPI.    Labs:    No results found for: CA125, CEA, AFPTM, CA199, HCGTM, HE4, PSADIAG    No results found for: WBC, HGB, HCT, PLT, LDH, CREATININE, AST, ALT, MG    Winferd Humphrey, MD, PhD  Radiation Oncology PGY-4    I saw and evaluated/examined the patient, participating in the key portions of the service. I discussed the findings, assessment, and plan of care with the resident. I agree with the findings and plan as documented in the resident's note.    74yo with a cT1N0 vs T3N0 R breast IDC (grade 2, ER/PR pos, HER2 equivocal without sufficient tissue for FISH).  Mammogram shows a subcm mass breast mass, although per review in tumor board there are changes extending in a linear fashion over the course of >5cm.  We discussed that if this is an early stage breast cancer (and HER2 is negative on retesting) then adjuvant treatment could be endocrine therapy, RT, or both treatments.  If the tumor were more larger or she had HER2 positive disease then additional systemic therapy could be considered.  She will move ahead with lumpectomy and we can see her back post-operatively to finalize adjuvant decisions.    Rayetta Humphrey, MD  Assistant Professor  [facility] Dept of Radiation Oncology  08/08/2020

## 2020-08-08 ENCOUNTER — Encounter
Admit: 2020-08-08 | Discharge: 2020-08-30 | Payer: MEDICARE | Attending: Radiation Oncology | Primary: Radiation Oncology

## 2020-08-08 ENCOUNTER — Encounter: Admit: 2020-08-08 | Discharge: 2020-08-30 | Payer: MEDICARE | Attending: Surgical Oncology | Primary: Surgical Oncology

## 2020-08-08 DIAGNOSIS — C50911 Malignant neoplasm of unspecified site of right female breast: Principal | ICD-10-CM

## 2020-08-08 DIAGNOSIS — C50311 Malignant neoplasm of lower-inner quadrant of right female breast: Principal | ICD-10-CM

## 2020-08-08 DIAGNOSIS — Z170 Estrogen receptor positive status [ER+]: Principal | ICD-10-CM

## 2020-08-08 NOTE — Unmapped (Signed)
Patient Name: [name]  Patient Age: 75 y.o.  Encounter Date: 08/08/2020    Referring Physician:   Genia Del, ANP  [address]  [id number] Phys Of[facility] Rene Kocher [place],  [place] [address]    Primary Care Provider:  Jenell Milliner, MD    Breast Cancer Consulting Physicians:  Surgical Oncology: Dr. Tama Gander, MD    Reason for Visit: Right breast cancer    HPI:  [name] is a 75 y.o. female with a PMH of HTN, degenerative joint disease, and liver disease on lactulose TID who presents to discuss surgical planning for a newly diagnosed right breast cancer. She is accompanied today by her two daughters, who also contribute to the history. [name] reported at her initial visit that this was discovered as the result of an abnormal screening mammogram performed on 1/19 at [facility]. After her suspicious mammogram, she had a diagnostic mammogram on 2/21 followed by a right breast ultrasound with biopsy on 2/28. She denies associated symptoms including pain, breast mass, breast retraction, nipple inversion, breast swelling, nipple discharge, or weight loss.     She presents to discuss results and plan definitive surgery. She denies any changes to her health since her last appointment. She denies any headaches, vision changes, chest pain, shortness of breath, abdominal pain, nausea, vomiting, or changes in bowel/bladder habits. She reports going through menopause in her 62s and denies any use of hormonal birth control or other hormones during her life. She has been pregnant twice and has two daughters. She has a history of liver disease for which she takes lactulose three times daily.       Medical History:  The following portions of the patient's history were reviewed and updated as appropriate: allergies, current medications, past family history, past medical history, past social history, past surgical history and problem list.    Review of Systems   10 organ systems reviewed and pertinent as noted in HPI.       Physical Examination:   Blood pressure 154/75, pulse 74, temperature 36.7 ??C, temperature source Temporal, resp. rate 18, SpO2 97 %.  General Appearance:  No acute distress, well appearing and well nourished.   Head:  Normocephalic, atraumatic.   Eyes:  Conjuctiva and lids appear normal. Pupils equal and round,   sclera anicteric.   Ears:  Overall appearance normal with no scars, lesions or               masses. Hearing is grossly normal.   Nose: Nares grossly normal, no drainage.   Throat: Lips and mucosa normal   Neck: Supple, symmetrical, trachea midline   Pulmonary:    Normal respiratory effort. Equal chest rise bilaterally. No audible wheezing.   Cardiovascular:  Regular rate and rhythm   Abdomen:   Soft, non-tender, non-distended   Musculoskeletal: Normal gait.  Extremities without clubbing, cyanosis, or           edema.   Skin: Skin color, texture, turgor normal, no rashes or lesions.   Neurologic: No motor abnormalities noted. Sensation grossly intact.   Lymphatic:  Breast: No cervical or supraclavicular LAD noted.   A comprehensive examination of the breasts and chest wall was performed with the patient upright and supine with arms at her sides and above her head.   Bilateral breast normal in size, normal symmetry, normal contour, no dimpling, no skin changes, nipple everted, nipple exhibits no crusting or excoriation, no masses or nodules. Small amount of healing ecchymosis  on the right inferior breast at the 5 o'clock position.   Psychiatric: Judgement and insight appropriate. Oriented to person,         place, and time.       Diagnostic Studies:   I personally reviewed all diagnostic imaging, including tomosynthesis and ultrasound.  Diagnostic mammography, performed on 2/21 at [facility], reveals a 0.7 x 0.6 x 0.6 cm mass at the 5 o'clock position 8cm from the nipple.  Diagnostic ultrasound, performed on 2/21, reveals 0.7 x 0.6 x 0.6cm asymmetry in the lower inner quadrant of the breast at the 5 o'clock position, 8 CFN. Biopsy/Pathology Review:  Ultrasound guided core needle biopsy was performed on 07/30/20 of the 5 o'clock lesion of the right breast, which revealed infiltrating ductal carcinoma, Nottingham grade 2, estrogen receptor 100% positive, progesterone receptor 100% positive and H2N equivocal (2+).      Assessment/Plan  [name] is a 75 y.o. female with a newly diagnosed right breast infiltrating ductal carcinoma - likely a cT1bN0 breast cancer.    After reviewing all available records and imaging, we had a frank and thorough discussion regarding her course of care. I believe Lajean is a good candidate for breast conservation.  The risks and benefits of this treatment plan were discussed in detail.    We specifically discussed:  1. We discussed the risks and benefits of proceeding with a right partial mastectomy with a sentinel lymph node biopsy. Consent was obtained in clinic today with plan for surgery in the first week of April.

## 2020-08-08 NOTE — Unmapped (Signed)
Pt is here with her daughters to consult with Dr Carles Collet for the role of radiation in treating her breast cancer. I spent 7 mins educating pt and daughters on radiation treatment process and planning. They were given contact information and questions were answered.

## 2020-08-08 NOTE — Unmapped (Signed)
complete

## 2020-08-09 NOTE — Unmapped (Signed)
It was medically necessary for me to see the patient because a decision regarding surgery needed to be made.  I saw and examined the patient. I agree with the findings and the plan of care as documented in the resident's note.  She has a small screen detected right breast cancer.  This is ER+PR+HER2 negative.  She is an excellent candidate for breast preservation.  I personally described the surgical procedure and associated risks in detail to the patient.  All questions answered.  Informed consent signed.  Surgery scheduled.    Charlott Rakes MD  Fayrene Fearing and Eppie Gibson Professor of Surgery

## 2020-08-20 MED ORDER — POTASSIUM CHLORIDE ER 20 MEQ TABLET,EXTENDED RELEASE(PART/CRYST)
ORAL_TABLET | Freq: Two times a day (BID) | ORAL | 1 refills | 90 days | Status: CP
Start: 2020-08-20 — End: 2021-08-20

## 2020-08-20 NOTE — Unmapped (Signed)
Patient is requesting the following refill  Requested Prescriptions     Pending Prescriptions Disp Refills   ??? potassium chloride (KLOR-CON) 20 MEQ CR tablet [Pharmacy Med Name: POTASSIUM CL ER TABLETS] 180 tablet 1     Sig: TAKE 1 TABLET(20 MEQ) BY MOUTH TWICE DAILY       Recent Visits  Date Type Provider Dept   11/14/19 Office Visit Jenell Milliner, MD [facility]   Showing recent visits within past 365 days with a meds authorizing provider and meeting all other requirements  Future Appointments  No visits were found meeting these conditions.  Showing future appointments within next 365 days with a meds authorizing provider and meeting all other requirements       Labs:   Potassium:   Potassium (mmol/L)   Date Value   11/14/2019 4.7

## 2020-08-21 DIAGNOSIS — G9340 Encephalopathy, unspecified: Principal | ICD-10-CM

## 2020-08-21 MED ORDER — LACTULOSE 10 GRAM/15 ML ORAL SOLUTION
5 refills | 0 days | Status: CP
Start: 2020-08-21 — End: ?

## 2020-08-21 NOTE — Unmapped (Signed)
Patient is requesting the following refill  Requested Prescriptions     Pending Prescriptions Disp Refills   ??? lactulose (CHRONULAC) 10 gram/15 mL solution [Pharmacy Med Name: LACTULOSE 10GM/15ML SOLUTION]  3     Sig: TAKE 15 ML BY MOUTH THREE TIMES DAILY, TAKE ENOUGH TO HAVE 3 BOWEL MOVEMENTS PER DAY       Recent Visits  Date Type Provider Dept   11/14/19 Office Visit Jenell Milliner, MD [facility]   Showing recent visits within past 365 days with a meds authorizing provider and meeting all other requirements  Future Appointments  No visits were found meeting these conditions.  Showing future appointments within next 365 days with a meds authorizing provider and meeting all other requirements

## 2020-08-25 DIAGNOSIS — I10 Essential (primary) hypertension: Principal | ICD-10-CM

## 2020-08-25 DIAGNOSIS — K766 Portal hypertension: Principal | ICD-10-CM

## 2020-08-25 MED ORDER — FOLIC ACID 1 MG TABLET
ORAL_TABLET | Freq: Every day | ORAL | 1 refills | 90 days | Status: CP
Start: 2020-08-25 — End: 2021-08-25

## 2020-08-25 MED ORDER — CARVEDILOL 3.125 MG TABLET
ORAL_TABLET | Freq: Two times a day (BID) | ORAL | 1 refills | 90 days | Status: CP
Start: 2020-08-25 — End: 2021-08-25

## 2020-08-28 NOTE — Unmapped (Signed)
Abstraction Result Flowsheet Data    This patient's last AWV date: [facility] Last Medicare Wellness Visit Date: 07/18/2019  This patients last WCC/CPE date: : Not Found      Reason for Encounter  Reason for Encounter: Outreach  Primary Reason for Call: AWV  Outreach Call Outcome: Patient would like call back later (Patients daughter requested a CB in April to schedule AWV.)  Text Message: No

## 2020-08-30 NOTE — Unmapped (Signed)
[name] was screened and identified to be a potential participant for [facility] [phone] which is a Breast Cancer Tissue Bank overseen by the Principal Investigation Dr. Tomi Likens.     Patient was informed of their potential eligibility for [phone] and confirmed their interest in participating in the study during our phone call today.     The details of the study were discussed with the patient (or patient's LAR), including but not limited to: Research is voluntary, and consent can be withdrawn at any time; study purpose; possible risks and benefits; collection and storage of data and specimens; time commitment; contact information for any questions. The patient was given reasonable time to consider participation in the study, in the absence of coercion or undue influence. All questions and concerns were addressed to the satisfaction of the participant (or [name]) prior to signing the consent document.    We discussed signing the consent forms in person at tomorrow's appointment in mammography at 2:30pm.     Robert Bellow MPH  Oncology Clinical Research Coordinator [phone])  [email or web address]  [phone]

## 2020-08-31 ENCOUNTER — Encounter: Admit: 2020-08-31 | Discharge: 2020-09-01 | Payer: MEDICARE

## 2020-08-31 DIAGNOSIS — C50911 Malignant neoplasm of unspecified site of right female breast: Principal | ICD-10-CM

## 2020-08-31 NOTE — Unmapped (Signed)
[name] Lund T. Tagle was screened and identified to be a potential participant for [facility] [phone] which is a Breast Cancer Tissue Bank overseen by the Principal Investigation Dr. Tomi Likens.     Patient was informed of their potential eligibility for [phone] and confirmed their interest in participating in the study.     The details of the study were discussed with the patient (or patient's LAR), including but not limited to: Research is voluntary, and consent can be withdrawn at any time; study purpose; possible risks and benefits; collection and storage of data and specimens; time commitment; contact information for any questions. The patient was given reasonable time to consider participation in the study, in the absence of coercion or undue influence. All questions and concerns were addressed to the satisfaction of the participant (or [name]) prior to signing the consent document.    Signed Date and Time: 08/31/20 1:50pm    [x]  Patient (or their LAR) signed and dated the current IRB-approved consent form and HIPAA as follows: X In my presence  [] Additional Documentation of verbal consent was obtained (Only relevant for Non-English Speaking Patients)  [x]  Patient agreed to tissue donation for scheduled upcoming procedures.     A copy of the signed consent form and HIPAA form was provided to the patient. No study procedures were performed prior to the patient signing the consent forms. Patient verbalized understanding of the information presented. The informed consent, HIPAA Authorization Form, and Eligibility Criteria Form will be placed in the regulatory files in the Office of Clinical & Translational Research as well as scanned into [employer]. Every effort to maintain confidentiality will be employed. The treating provider was notified of the participant's consent to be enrolled in the study and agrees with enrollment of subject. Additionally, a treating provider approved the patients eligibility prior to study procedures.     [name]  confirmed that they consented to donating samples during upcoming procedures where biospecimen collection is appropriate.      Plan and any additional notes: It was discussed with patient that research collection with take place during upcoming procedure on 09/03/20 during a breast surgery.     Robert Bellow MPH  Oncology Clinical Research Coordinator [phone])  [email or web address]  [phone]

## 2020-09-03 ENCOUNTER — Ambulatory Visit: Admit: 2020-09-03 | Discharge: 2020-09-03 | Payer: MEDICARE

## 2020-09-03 ENCOUNTER — Encounter: Admit: 2020-09-03 | Discharge: 2020-09-03 | Payer: MEDICARE

## 2020-09-03 ENCOUNTER — Encounter
Admit: 2020-09-03 | Discharge: 2020-09-03 | Payer: MEDICARE | Attending: Certified Registered" | Primary: Certified Registered"

## 2020-09-03 DIAGNOSIS — C50811 Malignant neoplasm of overlapping sites of right female breast: Principal | ICD-10-CM

## 2020-09-03 DIAGNOSIS — Z170 Estrogen receptor positive status [ER+]: Principal | ICD-10-CM

## 2020-09-03 MED ORDER — OXYCODONE 5 MG TABLET
ORAL_TABLET | ORAL | 0 refills | 2 days | Status: CP | PRN
Start: 2020-09-03 — End: 2020-09-08
  Filled 2020-09-03: qty 10, 1d supply, fill #0

## 2020-09-03 MED ADMIN — sodium chloride irrigation (NS) 0.9 % irrigation solution: @ 15:00:00 | Stop: 2020-09-03

## 2020-09-03 MED ADMIN — dexamethasone (DECADRON) 4 mg/mL injection: INTRAVENOUS | @ 15:00:00 | Stop: 2020-09-03

## 2020-09-03 MED ADMIN — cellulose, oxidized reg 2"X 14" pad (SURGICEL): TOPICAL | @ 15:00:00 | Stop: 2020-09-03

## 2020-09-03 MED ADMIN — lactated Ringers infusion: 10 mL/h | INTRAVENOUS | @ 14:00:00 | Stop: 2020-09-03

## 2020-09-03 MED ADMIN — sterile water irrigation solution: @ 15:00:00 | Stop: 2020-09-03

## 2020-09-03 MED ADMIN — lactated Ringers infusion: 10 mL/h | INTRAVENOUS | @ 16:00:00 | Stop: 2020-09-03

## 2020-09-03 MED ADMIN — fentaNYL (PF) (SUBLIMAZE) injection: INTRAVENOUS | @ 14:00:00 | Stop: 2020-09-03

## 2020-09-03 MED ADMIN — acetaminophen (TYLENOL) tablet 1,000 mg: 1000 mg | ORAL | @ 18:00:00 | Stop: 2020-09-03

## 2020-09-03 MED ADMIN — isosulfan blue (LYMPHAZURIN) 1 % injection: SUBCUTANEOUS | @ 15:00:00 | Stop: 2020-09-03

## 2020-09-03 MED ADMIN — Tc-99m Filtered Sulfur Colloid (fSC): 1.07 | SUBCUTANEOUS | @ 13:00:00 | Stop: 2020-09-03

## 2020-09-03 MED ADMIN — ondansetron (ZOFRAN) injection: INTRAVENOUS | @ 15:00:00 | Stop: 2020-09-03

## 2020-09-03 MED ADMIN — ePHEDrine (PF) 25 mg/5 mL (5 mg/mL) in 0.9% sodium chloride syringe Syrg: INTRAVENOUS | @ 15:00:00 | Stop: 2020-09-03

## 2020-09-03 MED ADMIN — ceFAZolin (ANCEF) IVPB 2 g in 50 ml dextrose (premix): 2 g | INTRAVENOUS | @ 15:00:00 | Stop: 2020-09-03

## 2020-09-03 MED ADMIN — lidocaine (XYLOCAINE) 20 mg/mL (2 %) injection: INTRAVENOUS | @ 15:00:00 | Stop: 2020-09-03

## 2020-09-03 MED ADMIN — propofoL (DIPRIVAN) injection: INTRAVENOUS | @ 15:00:00 | Stop: 2020-09-03

## 2020-09-03 MED ADMIN — bupivacaine-epinephrine (PF) (MARCAINE-PF w/EPI) 0.25 %-1:200,000 30 mL, lidocaine (XYLOCAINE) 10 mg/mL (1 %) 30 mL: @ 16:00:00 | Stop: 2020-09-03

## 2020-09-03 MED ADMIN — fentaNYL (PF) (SUBLIMAZE) injection: INTRAVENOUS | @ 15:00:00 | Stop: 2020-09-03

## 2020-09-03 MED ADMIN — midazolam (VERSED) injection: INTRAVENOUS | @ 14:00:00 | Stop: 2020-09-03

## 2020-09-03 NOTE — Unmapped (Signed)
Brief Operative Note  (CSN: [id number])      Date of Surgery: 09/03/2020    Pre-op Diagnosis: R Breast Cancer    Post-op Diagnosis: same    Procedure(s):  MASTECTOMY, PARTIAL (EG, LUMPECTOMY, TYLECTOMY, QUADRANTECTOMY, SEGMENTECTOMY): 19301 (CPT??)  BX/EXC LYMPH NODE; OPEN, DEEP AXILRY NODE: 98119 (CPT??)  INTRAOPERATIVE IDENTIFICATION SENTINEL LYMPH NODE(S) INCLUDE INJECTION NON-RADIOACTIVE DYE, WHEN PERFORMED: 14782 (CPT??)  Note: Revisions to procedures should be made in chart - see Procedures activity.    Performing Service: Surgical Oncology Breast  Surgeon(s) and Role:     * Aris Everts, MD - Primary     * Jens Som, MD - Resident - Assisting    Assistant: None    Findings: see specimens    Anesthesia: General    Estimated Blood Loss: 10 mL    Complications: None    Specimens:   ID Type Source Tests Collected by Time Destination   1 : Right savi localized partial mastectomy, 1 clip superior, 2 clips lateral, inking as per protocol Tissue Breast, Right SURGICAL PATHOLOGY EXAM Aris Everts, MD 09/03/2020 1105    2 : Right breast superior margin, inking as per protocol Tissue Breast, Right SURGICAL PATHOLOGY EXAM Aris Everts, MD 09/03/2020 1106    3 : Right breast lateral margin, inking as per protocol Tissue Breast, Right SURGICAL PATHOLOGY EXAM Aris Everts, MD 09/03/2020 1106    4 : Right breast inferior margin, inking as per protocol Tissue Breast, Right SURGICAL PATHOLOGY EXAM Aris Everts, MD 09/03/2020 1106    5 : Right breast medial margin, inking as per protocol Tissue Breast, Right SURGICAL PATHOLOGY EXAM Aris Everts, MD 09/03/2020 1106    6 : Matted right axillary sentinel nodes; blue, hot, palpable at 1075 (BG 58)  Tissue Axilla, Right SURGICAL PATHOLOGY EXAM Aris Everts, MD 09/03/2020 1114        Implants: * No implants in log *    Surgeon Notes: I was present and scrubbed for the entire procedure    Charlott Rakes   Date: 09/03/2020  Time: 11:30 AM

## 2020-09-04 NOTE — Unmapped (Signed)
Operative Note    (CSN: [id number])    Date of Surgery: 09/03/2020    Pre-op Diagnosis: R Breast Cancer    Post-op Diagnosis: same    Procedure(s):  MASTECTOMY, PARTIAL (EG, LUMPECTOMY, TYLECTOMY, QUADRANTECTOMY, SEGMENTECTOMY): 19301 (CPT??)  BX/EXC LYMPH NODE; OPEN, DEEP AXILRY NODE: 98119 (CPT??)  INTRAOPERATIVE IDENTIFICATION SENTINEL LYMPH NODE(S) INCLUDE INJECTION NON-RADIOACTIVE DYE, WHEN PERFORMED: 14782 (CPT??)    Note: Revisions to procedures should be made in chart - see Procedures activity.    Operating Surgeon(s): Surgeon(s) and Role:     * Aris Everts, MD - Primary     * Jenna Luo, MD - Fellow - Diagnostic    Assistant: None    Anesthesia: General    Estimated Blood Loss: 10 mL    Complications: None    Specimens:   ID Type Source Tests Collected by Time Destination   1 : Right savi localized partial mastectomy, 1 clip superior, 2 clips lateral, inking as per protocol Tissue Breast, Right SURGICAL PATHOLOGY EXAM Aris Everts, MD 09/03/2020 1105    2 : Right breast superior margin, inking as per protocol Tissue Breast, Right SURGICAL PATHOLOGY EXAM Aris Everts, MD 09/03/2020 1106    3 : Right breast lateral margin, inking as per protocol Tissue Breast, Right SURGICAL PATHOLOGY EXAM Aris Everts, MD 09/03/2020 1106    4 : Right breast inferior margin, inking as per protocol Tissue Breast, Right SURGICAL PATHOLOGY EXAM Aris Everts, MD 09/03/2020 1106    5 : Right breast medial margin, inking as per protocol Tissue Breast, Right SURGICAL PATHOLOGY EXAM Aris Everts, MD 09/03/2020 1106    6 : Matted right axillary sentinel nodes; blue, hot, palpable at 1075 (BG 58)  Tissue Axilla, Right SURGICAL PATHOLOGY EXAM Aris Everts, MD 09/03/2020 1114        Operative Findings: See specimens    Indications: The patient is a 75 year old female who has a biopsy-proven right breast carcinoma.  She is strongly motivated for breast preservation.  With this in mind we will take to the operating room for the aforementioned surgical procedure.  The patient and her daughter well-informed the risks benefits and alternatives and requests to proceed.    Procedure Description: The patient was taken the operating room placed in supine position.  After adequate LMA anesthesia I injected 5 cc of Lymphazurin around her right nipple areolar complex.  We then prepped and draped in usual sterile fashion.  After appropriate timeout we used the SAVI detection device to identify where the reflector was located.  I elected to me make a radial incision at the 5 o'clock position of the right breast.  Skin subcutaneous tissues were then infiltrated with 1% lidocaine quarter percent Marcaine with epinephrine.  Skin incision was made.  Skin subcutaneous tissue divided hemostasis was achieved electrocautery.  The dissection was taken down through the underlying subcutaneous tissue.  Medial and lateral flaps were elevated.  We then isolated where the reflector was in the inframammary crease region.  We then planned our dissection going in a longitudinal fashion up towards the nipple areolar complex.  The breast tissue in question was grasped with perforating Adair clamps.  The dissection was taken all the way down to the fascia of the pectoralis major muscle.  This serves the deep border.  Specimen was amputated clipped and inked as per protocol.  My review and Dr. Rande Brunt review of the specimen radiograph revealed complete capture of the intact SAVI the biopsy  clip in the area in question.  We then took additional margins inferiorly medial lateral and superiorly.  Should be noted that the anterior margin was skin.  These were all inked as per protocol.  We then marked the limits of a resection with blue ligaclips.  We lined the pectoralis fascia with the Surgicel.  The deep breast parenchyma was then closed using running 2-0 Vicryl suture.  Additional Surgicel was placed.  Superficial subcutaneous tissue and deep dermis reapproximated using interrupted 3-0 Vicryl suture.  Skin closed using a running 4-0 Vicryl subcuticular stitch.  Dermabond will be applied at the end of the case.    We now turned our attention to the axilla.  Curvilinear incision was made using one of the native skin creases.  Skin subcutaneous tissues were infiltrated with 1% lidocaine quarter percent Marcaine with epinephrine.  Skin incision was made.  Skin subcutaneous tissue divided hemostasis was achieved electrocautery.  Dissection taken down through the underlying subcutaneous tissue.  Once we got below the clavipectoral fascia was clear that this was a deep level 2 node behind the pectoralis major and pectoralis minor muscle.  After gaining appropriate retraction we grasped the nodes in question.  Ligaclips were placed on the channel and the vessels in the nodes were enucleated en bloc.  The ex vivo counts on the node measured 1075.  The residual background counts measured 58.  There is no remaining palpable adenopathy.  Clearly we had identified her sentinel nodes.  Because I was deep and level 2 elected leave a 7 mm Jackson-Pratt drain.  Clavipectoral fascia was reapproximated using a running 3-0 Vicryl suture.  Subcutaneous tissue and deep dermis reapproximated using interrupted 3-0 Vicryl suture.  Skin closed using a running 4-0 Vicryl subcuticular stitch.  Dermabond was applied.  Patient tolerated the procedure well.    Attending Surgeon Attestation: I was present and scrubbed for the entire procedure up until skin closure.     Charlott Rakes, MD     Date: 09/03/2020  Time: 6:00 PM

## 2020-09-13 NOTE — Unmapped (Signed)
[name] contacted the [employer] requesting results of the following:     Procedure: Labs  Completed On: 09/03/20      Please contact [name] at [phone] for proper follow up.    Check Indicates criteria has been reviewed and confirmed with the patient:    [x]  Preferred Name   [x]  DOB and/or MR#  [x]  Preferred Contact Method  [x]  Phone Number(s)   []  MyChart     Thank you,   [name]  [facility]   [phone]

## 2020-09-13 NOTE — Unmapped (Signed)
Hi,     [name]  contacted the Communication Center regarding the following:    - Pt would like results from 09/03/20 explained as she is not able to understand what's in mychart...the patient would also like to know when her appointment will be to have tubes removed and how the staples will be removed as well     Please contact [name]  at [phone].    Thanks in advance,    [name]  [facility]   [phone]

## 2020-09-13 NOTE — Unmapped (Signed)
Hi,     [name] contacted the [employer] regarding the following:    - States that she would like to set an appointment for her tubes to be removed and to gather further information about that appointment.    Please contact [name] at [phone].    Thanks in advance,    [name]  [facility]   [phone]

## 2020-09-14 NOTE — Unmapped (Signed)
[facility] Triage Note    Reason for call: Return call    Time for incident: 11:20 am - 1:24 pm    Surgery performed on 09/03/20  Post-op clinic visit not scheduled, provider requests 09/20/20     Phone Assessment: Spoke with pt regarding call request.     Triage Recommendations: Relayed that provider requests pt be seen in clinic for her post-op clinic visit on Thursday but I do not have a time but someone will cal w/the time.  Relayed that her pathology will be discussed & staples removed during that visit.     Caller's Response: Thank you so much.     Outstanding tasks: Care team notified, no further action needed.

## 2020-09-20 ENCOUNTER — Encounter: Admit: 2020-09-20 | Discharge: 2020-09-21 | Payer: MEDICARE | Attending: Surgical Oncology | Primary: Surgical Oncology

## 2020-09-20 DIAGNOSIS — Z170 Estrogen receptor positive status [ER+]: Principal | ICD-10-CM

## 2020-09-20 DIAGNOSIS — C50811 Malignant neoplasm of overlapping sites of right female breast: Principal | ICD-10-CM

## 2020-09-20 NOTE — Unmapped (Signed)
Patient Name: [name]  Patient Age: 75 y.o.  Encounter Date: 09/20/2020    Referring Physician:   Referred Self  No address on file    Primary Care Provider:  Jenell Milliner, MD    Breast Cancer Consulting Physicians:  Surgical Oncology: [name] Roers, MD  Surgical Oncology Nurse Practitioner: [name] Bowl, NP  Radiation Oncology: [name] Humphrey, MD  Medical Oncology: [name] Champagne, MD  Plastic Surgery: None at this time  Genetics: None at this time      Reason for Visit: Post-operative visit    Diagnosis:   1. Right breast infiltrating ductal carcinoma  Cancer Staging  No matching staging information was found for the patient.      Procedure:   1. Right [facility] localized partial mastectomy and right sentinel lymph node biopsy, performed on 09/03/2020 by [name].    Clinical Trial Participant:   1. No clinical trials at this time.    Interim History:  [name] returns to the office after the above noted procedure for her postoperative visit. She reports she is recovering well. [name] denies fevers, chills, significant pain, bruising, redness and wound drainage postoperatively. [name] reports the JP drain is intact draining serosanguinous fluid in the amount of <5cc/24 hrs.     Historically, [name] presented with an abnormal screening mammogram performed on 06/20/2020 at [facility]. Subsequent diagnostic imaging on 07/23/2020 revealed a 0.7 x 0.6 x 0.6 cm irregular hypoechoic nonparallel mass at the 5 o'clock position 8 CFN of the right breast. Patient underwent USG biopsy of this mass on 07/30/2020 demonstrating cT1bN0 IDC G2 +/+/E with insufficient cellularity to for further analysis with HER2 FISH.     Pathology Review:  Final pathology revealed infiltrating ductal carcinoma, Nottingham grade 2, estrogen receptor 100% positive, progesterone receptor 100% positive, H2N equivocal (2+) and FISH non-amplified measuring 1.7 cm in greatest dimension, final margins are clear. 1 of 1 sentinel lymph nodes demonstrated metastatic carcinoma, tumor deposit 3.5 cm with extracapsular extension present >42mm ER 100% positive, HER2 negative (1+). Additional findings include ductal carcinoma in situ grade, 2, solid, papillary, and cribriform types measuring at least 5.5 cm in size. This was reviewed with [name]. We discussed the significance of this finding at great length. She states her understanding of this pathology report.  A copy of the pathology report was provided to [facility].     Oncology History    No history exists.       Physical Exam:  Blood pressure 154/75, pulse 81, temperature 37.5 ??C (99.5 ??F), temperature source Temporal, resp. rate 18, height 157.5 cm (5' 2), weight (!) 103.5 kg (228 lb 3.2 oz), SpO2 96 %.  General Appearance: No acute distress, well appearing and well nourished.   Breast: The breasts are symmetrical. The right axillary incision and right surgical incision are clean, dry, intact and healing well. There is no underlying hematoma or seroma and The JP drain is intact draining serosanguinous fluid in the amount of <5cc/24 hrs. The JP drain was removed today.       Assessment:  Ms. [name] is a 75 y.o. female with a right breast infiltrating ductal carcinoma, estrogen receptor positive, progesterone receptor positive and HER2 equivocal not amplified by [facility], who is recovering well after right [facility] localized partial mastectomy and right sentinel lymph node biopsy.  Cancer Staging  No matching staging information was found for the patient.      Plan:  The pathology was discussed and a copy was provided to her.  The type of tumor, grade and tumor markers have been discussed.  The patient's surgical management is now complete.  The patient is referred to medical oncology to discuss systemic therapy. We discussed that she may require adjuvant chemotherapy in addition to endocrine therapy given evidence of node positive disease on final pathology. Follow up with [name] in medical oncology is scheduled for 10/08/2020.   The patient is referred to radiation oncology to discuss radiation therapy  Patient's JP drain is intact with serosanguinous <5 ccs/24hrs, removed in clinic today.  Patient can discontinue surgical bra restrictions on Monday.   Follow-up in 1 year with bilateral mammogram.     At the conclusion of our visit all of [name]'s questions and concerns were addressed and arrangements were made for follow up US discussed above. She was instructed call us with any further questions, concerns or issues.    Jump to Staging  No matching staging information was found for the patient.    Scribe's Attestation: [name] Roers, MD obtained and performed the history, physical exam and medical decision making elements that were entered into the chart.  Signed by [name], Scribe, on September 20, 2020 2:21 PM.      The documentation recorded by the scribe accurately reflects the service I personally performed and the decisions made by me. T1b SN+ ER+ PR+ HER2- right breast cancer.  I have asked her to have an open mind regarding a chemotherapy discussion with [name].  RTC in one year with bilateral mammograms.  [name] Rakes, MD

## 2020-09-26 NOTE — Unmapped (Signed)
Abstraction Result Flowsheet Data    This patient's last AWV date: [facility] Last Medicare Wellness Visit Date: 07/18/2019  This patients last WCC/CPE date: : Not Found      Reason for Encounter  Reason for Encounter: Outreach  Primary Reason for Call: AWV  Outreach Call Outcome: Declined (Dealing with Cancer doesnt want to schedule other appts.)  Text Message: No     Colorectal Cancer Screening Gap  Colorectal Cancer Screening Gap Reviewed: Yes  Gap Closed? : No  If No, Result Details: No - Scheduled

## 2020-10-03 DIAGNOSIS — C50811 Malignant neoplasm of overlapping sites of right female breast: Secondary | ICD-10-CM | POA: Insufficient documentation

## 2020-10-03 NOTE — Unmapped (Signed)
BREAST MEDICAL ONCOLOGY ENCOUNTER  ---------------------------------------------------------------------------------------------------------------------------------------------------------------------  New Outpatient Evaluation    Referring Physician: Aris Everts, *    PCP: Jenell Milliner, MD    Consulting Physicians: Surgical oncology: Lucretia Roers, MD.  Radiation oncology: Rayetta Humphrey, MD.     Reason for Visit: The patient is seen in consultation at the request of Dr. Aris Everts for evaluation of breast cancer.  -----------------------------------------------------------------------------------------------------  I personally spent 80 minutes face-to-face and non-face-to-face in the care of this patient, which includes all pre, intra, and post visit time on the date of service.  ----------------------------------------------------------------------------------------------------  Assessment: [name] is a 75 y.o. female [place] [facility] [place] who had abnormal screening MMG 06/20/20. She had diagnostic MMG/US of right breast 07/23/20 with 0.7 x 0.6 x 0.6 cm irregular hypoechoic nonparallel mass with indistinct margins and peripheral vascularity at the 5:00 position approximately 8 CFN. Core bx of right breast 07/30/20 with IDC, G2, ER+(100%), PR+(100%), and HER-2 negative (2+ by IHC, FISH not amplified). Underwent right partial mastectomy 09/03/20. Final path: IDC, 1.7 cm in greatest dimension, G2, associated DCIS. Negative margins. 1/1 LN positive for carcinoma.     [name] presents today to discuss systemic therapy. The patient was given or had access to a copy of the pathology report and I reviewed it with her in detail. I explained the concept of micrometastases and the rational for adjuvant chemotherapy and endocrine therapy. She has node positive disease so I will consult with Dr. Tama Gander to discuss whether she will need additional surgery to further evaluate nodal disease. We discussed we will obtain systemic imaging due to her concerning back pain and node positive disease. I explained since she has a HR positive disease she will require anti-estrogen therapy for 5-10 years to reduce the recurrence risk. We discussed she will also require adjuvant radiation given that she had a lumpectomy vs and node positive disease. I explained our only question is whether she will need adjuvant chemotherapy.    We discussed obtaining Oncotype Dx testing, to assess if she is genomic alterations that predisposed her to a higher risk of recurrence which would prompt a recommendation for adjuvant chemotherapy. The role of genomic assays in prognosticating in adjuvant hormone receptor-positive breast cancer was discussed at length. The most commonly used of these is the Oncotype Dx Recurrence Score assay, a 21-gene assay that provides distant disease-free recurrence information assuming receipt of standard local therapy and 5 years adjuvant endocrine therapy.  Prospective/retrospective data from NSABP B-20 suggests a marginal, if any, benefit of chemotherapy in RS low (defined as no greater than 18) tumors, although methodologic issues preclude definitive prediction of no benefit in any RS subset.  The lion's share of chemotherapy benefit in that study was in RS high (defined as greater than 31) tumors. More recent data from the TailoRx trial suggests that in postmenopausal women there is little benefit of chemotherapy for any tumor with RS up to 25 (the gray zone now being between 25-31), whereas an unplanned subset analysis suggests that in premenopausal women chemotherapy benefit may accrue at lower RS with a reasonable cutpoint at 20 and higher although it is not clear whether this benefit might be due to the ovarian suppressing effects of chemotherapy. Generally similar data from smaller node-positive cohorts suggest that in appropriate patients this assay may also predict chemotherapy benefit in that setting, however prospective data await the results of RxPONDER. These data support the use of the RS in adjuvant decision-making, particularly in node-negative  breast cancer. All of these issues were discussed with the patient.    She will follow up with rad onc to plan adjuvant radiation. We will await for oncotype DX score to discuss whether to pursue chemotherapy. She will follow up tentatively in 3 to 4 months. She will reach out in the interim if she has any questions or concerns.     Overall plan of care: surgery --> possible adjuvant chemotherapy --> adjuvant radiation --> adjuvant ET     Plan    1.  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   Chemotherapy: Sent oncotype 10/08/20.   Radiation: Plan to meet with rad onc.   Endocrine therapy: Plan for 5-10 years of AI.   Bone health: Obtain baseline DEXA on AI.   Breast imaging: Plan for routine imaging.   Systemic staging: Will obtain PET due to back pain and node positive disease.   Genetics: None at this time.     2.  Supportive care  -- Hx of liver disease. Takes lactulose 3x daily. Follows with Dr. Raford Pitcher.   -- Arthralgias. In back and right knee. Chronic. Takes Tylenol.   -- Back lesions. Abnormal hyperpigmented keratosis to right lower back. Referred to dermatologist.     3. Follow-up  -- Sent oncotype Dx today   -- Ordered PET  -- Will follow up with rad onc   -- Tentatively RTC in 3-4 months     ** Prefers [facility] **     Addendum: No further axillary surgery required. PET/CT with no definitive distant mets. Lumpectomy bed uptake likely reactive. Will f/u with PCP to evaluate colonic and GU uptake.  -----------------------------------------------------------------------------------------------------  Interval history:   [name] presents today in wheelchair with two daughters, [name] and [place], to discuss systemic therapy. She is recuperating from her surgery.  Her surgical wounds are healing well.   -- Doing well overall. Spends more than half the day in the recliner due to choice.   -- Anxiety. Related to diagnosis.   -- Right knee arthralgias. Chronic.   -- Lower back pain. Chronic. Worse with exertion. Takes Tylenol.   -- Back lesion. Non-tender.   -- Full 12 ROS reviewed and otherwise mild/none.     Review of Systems: A complete twelve systems review was obtained and is positive per the HPI but otherwise negative in detail. See MIMS #1170 where available.    Functional Status: ECOG PS 0. ADLS independent. IADLS independent. Uses wheelchair for long walks. Cognition intact.     Social: Lives in [place], [place] with husband, daughter [name], and her granddaughter. Has another daughter [name], three grandchildren, and a brother nearby. Married for 50 years. Retired. 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     History of the Present Illness: [name] is a pleasant 75 y.o. female who  has a past medical history of Alcoholism (CMS-HCC), Alcoholism /alcohol abuse, Cirrhosis (CMS-HCC), Depression, Hypertension, Liver disease, and Malignant neoplasm of overlapping sites of right breast in female, estrogen receptor positive (CMS-HCC) (10/03/2020). She is seen in consultation at the request of Dr. Aris Everts for evaluation of breast cancer. She had an abnormal screening MMG 06/20/20. She had diagnostic MMG/US of right breast 07/23/20 with 0.7 x 0.6 x 0.6 cm irregular hypoechoic nonparallel mass with indistinct margins and peripheral vascularity at the 5:00 position approximately 8  CFN. Core bx of right breast 07/30/20 with IDC, G2, ER+(100%), PR+(100%), and HER-2 negative (2+ by IHC, FISH not amplified). She underwent right partial mastectomy 09/03/20. Final path: IDC, 1.7 cm in greatest dimension, G2, associated DCIS. Negative margins. 1/1 LN positive for carcinoma.     Oncology History Overview Note   Oncotype? Malignant neoplasm of overlapping sites of right breast in female, estrogen receptor positive (CMS-HCC)   07/30/2020 Biopsy    A: Breast, right, core biopsy  - Invasive ductal carcinoma (see comment)  - Nottingham combined histologic grade: 2               Tubule formation: 3               Nuclear grade: 2  Mitotic score: 1  - Invasive carcinoma measures approximately 3.5 mm in this specimen  - Solid papillary ductal carcinoma in situ  - Ancillary studies (see biomarker synoptic)               Estrogen receptor: Positive (100%)               Progesterone receptor: Positive (100%)               HER2 IHC: Equivocal (2+)  HER2 FISH: Insufficient cellularity     09/03/2020 Surgery    A.  Breast, right, partial mastectomy  - Invasive ductal carcinoma (see synoptic report)  - Tumor size: 17 mm in greatest dimension  - Ductal carcinoma in situ (DCIS), grade 2, solid papillary and cribriform types  - Size extent of DCIS: At least 55 mm   - Margin status for specimen A (see extended margins below)          Invasive carcinoma: Negative, <1 mm from anterior and inferior margins          Ductal carcinoma in situ: Negative, <1 mm from superior margin  - Ancillary studies reported on prior core biopsy [id number]          Estrogen receptor: Positive (100%)          Progesterone receptor: Positive (100%)          HER2 IHC: Equivocal (2+)          HER2 FISH: Insufficient cellularity (see comment)     B.  Breast, right, superior margin,excision  - Single microscopic focus of ductal carcinoma in situ  - Margin: Negative, <1 mm    C.  Breast, right, lateral margin, excision  - Negative for carcinoma    D.  Breast, right,  inferior margin, excision  - Negative for carcinoma    E.  Breast, right, medial margin, excision  - Negative for carcinoma     F. Right axilla, sentinel lymph node, biopsy  - One lymph node positive for carcinoma (1/1)         Patient Active Problem List   Diagnosis   ??? Alcoholic liver disease (CMS-HCC)   ??? Hypokalemia   ??? HTN (hypertension)   ??? Chronic maxillary sinusitis   ??? Alcoholism (CMS-HCC)   ??? Encephalopathy   ??? Depression with anxiety   ??? Obesity   ??? Macrocytosis   ??? DJD (degenerative joint disease)   ??? Malignant neoplasm of overlapping sites of right breast in female, estrogen receptor positive (CMS-HCC)       Past Medical History:   Diagnosis Date   ??? Alcoholism (CMS-HCC)    ??? Alcoholism /alcohol abuse    ??? Cirrhosis (CMS-HCC)    ???  Depression    ??? Hypertension    ??? Liver disease    ??? Malignant neoplasm of overlapping sites of right breast in female, estrogen receptor positive (CMS-HCC) 10/03/2020       Past Surgical History:   Procedure Laterality Date   ??? BREAST BIOPSY Right     benign-a long time ago   ??? CHOLECYSTECTOMY     ??? PR BX/REMV,LYMPH NODE,DEEP AXILL Right 09/03/2020    Procedure: BX/EXC LYMPH NODE; OPEN, DEEP AXILRY NODE;  Surgeon: Aris Everts, MD;  Location: [facility];  Service: Surgical Oncology Breast   ??? PR INTRAOPERATIVE SENTINEL LYMPH NODE ID W DYE INJECTION Right 09/03/2020    Procedure: INTRAOPERATIVE IDENTIFICATION SENTINEL LYMPH NODE(S) INCLUDE INJECTION NON-RADIOACTIVE DYE, WHEN PERFORMED;  Surgeon: Aris Everts, MD;  [facility];  Service: Surgical Oncology Breast   ??? PR MASTECTOMY, PARTIAL Right 09/03/2020    Procedure: MASTECTOMY, PARTIAL (EG, LUMPECTOMY, TYLECTOMY, QUADRANTECTOMY, SEGMENTECTOMY);  Surgeon: Aris Everts, MD;  Location: [facility] OR [facility];  Service: Surgical Oncology Breast   ??? PR UPPER GI ENDOSCOPY,BIOPSY N/A 02/03/2018    Procedure: UGI ENDOSCOPY; WITH BIOPSY, SINGLE OR MULTIPLE;  Surgeon: Alfred Levins, MD;  [facility] PROCED[facility];  Service: Gastroenterology   ??? TUBAL LIGATION       Gyn History: G2P2. Menopause in her 13s. Denies HRT.     Medications:  Current Outpatient Medications   Medication Sig Dispense Refill   ??? acetaminophen (TYLENOL) 325 MG tablet Take 2 tablets (650 mg total) by mouth every six (6) hours as needed for pain.  0   ??? carvediloL (COREG) 3.125 MG tablet Take 1 tablet (3.125 mg total) by mouth Two (2) times a day. 180 tablet 1   ??? cyanocobalamin 1000 MCG tablet Take 1,000 mcg by mouth daily.     ??? escitalopram oxalate (LEXAPRO) 20 MG tablet Take 1 tablet (20 mg total) by mouth daily. TAKE 1 TABLET(20 MG) BY MOUTH DAILY 90 tablet 1   ??? folic acid (FOLVITE) 1 MG tablet Take 1 tablet (1 mg total) by mouth daily. 90 tablet 1   ??? lactulose (CHRONULAC) 10 gram/15 mL solution TAKE 15 ML BY MOUTH THREE TIMES DAILY, TAKE ENOUGH TO HAVE 3 BOWEL MOVEMENTS PER DAY 1350 mL 5   ??? magnesium oxide (MAG-OX) 400 mg (241.3 mg elemental magnesium) tablet Take 1 tablet (400 mg total) by mouth Two (2) times a day. 180 tablet 1   ??? potassium chloride (KLOR-CON) 20 MEQ CR tablet Take 1 tablet (20 mEq total) by mouth Two (2) times a day. 180 tablet 1     No current facility-administered medications for this visit.       Allergies:  Allergies   Allergen Reactions   ??? Sulfa (Sulfonamide Antibiotics) Anaphylaxis   ??? Sulfur Anaphylaxis     Tolerated sulfur colloid injection without incident 09/03/2020.   ??? Aspirin      thrombocytopenia       Family History: Cancer-related family history is negative for Breast cancer, Cancer, Colon cancer, and Ovarian cancer.    Social History:   Social History     Social History Narrative   ??? Not on file     Physical Examination:  Vital Signs: BP 155/83  - Pulse 71  - Temp 37.2 ??C (99 ??F) (Temporal)  - Resp 18  - Wt (!) 106.5 kg (234 lb 14.4 oz)  - SpO2 97%  - BMI 42.96 kg/m??   General:  Healthy-appearing patient in no acute distress.Marland Kitchen  HEENT: PERRTLA, EOMI, OP clear  Cardiovascular: Normal S1 and S2. RRR. No audible murmurs.  Respiratory: Chest clear to percussion and auscultation.   Gastrointestinal: Abdomen soft without masses and tenderness, no hepatosplenomegaly or other masses.   Musculoskeletal: No bony pain or tenderness.   Skin: Abnormal hyperpigmented keratosis of lower right back. Two macules, not concerning.  Breasts: (R) breast: Well healed incision in UOQ. Stigmata from recent drain removal. Healed incision in inferior aspect extending down to the inframammary fold. (L) breast: benign.   Neurologic:  Alert and oriented. Grossly non-focal  Lymphatic: No cervical, axillary or supraclavicular adenopathy.   Extremity: No lower extremity edema or upper extremity lymphedema    DATA REVIEW:    Laboratory: I personally reviewed the pertinent laboratory data.    Radiology: I personally reviewed the pertinent imaging.    Pathology: I personally reviewed the pathology report.    Scribe Statement: Documentation assistance was provided by me personally, Norwood Levo, a scribe. Olivia Mackie, MD obtained and performed the history, physical exam and medical decision making elements that were entered into the chart. Signed by Norwood Levo, 10/08/20 at 2:16 PM    Provider Attestation: Documentation assistance was provided by the Scribe, Norwood Levo. I was present during the time the encounter was recorded. The information recorded by the Scribe was done at my direction and has been reviewed and validated by me. Signed by Olivia Mackie, MD 10/08/20 at 2:16 PM

## 2020-10-03 NOTE — Unmapped (Addendum)
It was a pleasure to see you today in the Medical Oncology Clinic.      Please call our nurse navigator, Dan Maker, if you have any interval questions or concerns:     For appointments, please call [phone]     Nurse Navigator:   Modena Jansky, RN: phone: [phone]     Prescription refills: [phone]     FAX: [phone]     For emergencies, evenings or weekends, please call [phone] and ask for the oncology fellow on call.     Reasons to call emergency line may include:   Fever of 100.5 or greater   Nausea and/or vomiting not relieved with nausea medicine   Diarrhea or constipation not relieved with bowel regimen   Severe pain not relieved with usual pain regimen     Future Appointments   Date Time Provider Department Center   10/08/2020  1:30 PM Olivia Mackie, MD [id number]

## 2020-10-08 ENCOUNTER — Encounter
Admit: 2020-10-08 | Discharge: 2020-10-09 | Payer: MEDICARE | Attending: Geriatric Medicine | Primary: Geriatric Medicine

## 2020-10-08 DIAGNOSIS — C50911 Malignant neoplasm of unspecified site of right female breast: Principal | ICD-10-CM

## 2020-10-08 DIAGNOSIS — C50811 Malignant neoplasm of overlapping sites of right female breast: Principal | ICD-10-CM

## 2020-10-08 DIAGNOSIS — Z170 Estrogen receptor positive status [ER+]: Principal | ICD-10-CM

## 2020-10-08 NOTE — Unmapped (Signed)
Oncotype ordered per Dr. Archie Balboa, faxed request to [facility] path for send surgical specimen to [facility], confirmation received. EMR

## 2020-10-10 DIAGNOSIS — L989 Disorder of the skin and subcutaneous tissue, unspecified: Principal | ICD-10-CM

## 2020-10-15 DIAGNOSIS — Z170 Estrogen receptor positive status [ER+]: Principal | ICD-10-CM

## 2020-10-15 DIAGNOSIS — C50811 Malignant neoplasm of overlapping sites of right female breast: Principal | ICD-10-CM

## 2020-10-15 NOTE — Unmapped (Signed)
I spoke with the pt and she is aware that her PET is scheduled for 5/26 @ 1:30.

## 2020-10-15 NOTE — Unmapped (Signed)
Hi,     Aram Beecham contacted the Communication Center regarding the following:    - States the patient is following up on the patient's PET scan and when the orders would be ready.     Please contact Mikenzie at [phone] .    Thanks in advance,    Drema Balzarine  [facility]   [phone]

## 2020-10-18 NOTE — Unmapped (Signed)
Call to patients daughter Aram Beecham, patients caregiver, and at appointments with patient. Let her know oncotype result 8, which is low, and  Chemotherapy would give no apparent benefit. Aram Beecham will pass on good news to her mom right after we hang up. Patient's daughter Is aware we still need to get the PET CT next week to make sure no distant disease. If negative, patient next steps would be additional surgery or radiation therapy next. EMR

## 2020-10-19 NOTE — Unmapped (Signed)
Called and spoke with pt to get her scheduled to see Dr Beryle Flock in [facility] on 6/1 at 3pm. Pt may have to r/s due to transportation from her daughter but for now appt is confirmed

## 2020-10-25 ENCOUNTER — Ambulatory Visit: Admit: 2020-10-25 | Discharge: 2020-10-26 | Payer: MEDICARE

## 2020-10-25 ENCOUNTER — Encounter: Admit: 2020-10-25 | Discharge: 2020-10-26 | Payer: MEDICARE

## 2020-10-25 MED ADMIN — Fluorine F-18 FDG 4-40 mCi IV: 5 | INTRAVENOUS | @ 18:00:00 | Stop: 2020-10-25

## 2020-10-30 MED ORDER — MAGNESIUM OXIDE 400 MG (241.3 MG MAGNESIUM) TABLET
ORAL_TABLET | 1 refills | 0 days | Status: CP
Start: 2020-10-30 — End: ?

## 2020-10-30 NOTE — Unmapped (Signed)
Patient is requesting the following refill  Requested Prescriptions     Pending Prescriptions Disp Refills   ??? magnesium oxide (MAG-OX) 400 mg (241.3 mg elemental magnesium) tablet [Pharmacy Med Name: MAGNESIUM OXIDE 400MG  TABLETS] 180 tablet 1     Sig: TAKE 1 TABLET(400 MG) BY MOUTH TWICE DAILY       Recent Visits  Date Type Provider Dept   11/14/19 Office Visit Jenell Milliner, MD [facility]   Showing recent visits within past 365 days with a meds authorizing provider and meeting all other requirements  Future Appointments  No visits were found meeting these conditions.  Showing future appointments within next 365 days with a meds authorizing provider and meeting all other requirements       Labs:

## 2020-10-31 ENCOUNTER — Encounter: Admit: 2020-10-31 | Discharge: 2020-11-29 | Payer: MEDICARE

## 2020-10-31 ENCOUNTER — Encounter
Admit: 2020-10-31 | Discharge: 2020-11-29 | Payer: MEDICARE | Attending: Radiation Oncology | Primary: Radiation Oncology

## 2020-10-31 ENCOUNTER — Ambulatory Visit: Admit: 2020-10-31 | Discharge: 2020-11-29 | Payer: MEDICARE

## 2020-10-31 DIAGNOSIS — F32A Depression, unspecified: Principal | ICD-10-CM

## 2020-10-31 DIAGNOSIS — K7460 Unspecified cirrhosis of liver: Principal | ICD-10-CM

## 2020-10-31 DIAGNOSIS — I10 Essential (primary) hypertension: Principal | ICD-10-CM

## 2020-10-31 DIAGNOSIS — F1020 Alcohol dependence, uncomplicated: Principal | ICD-10-CM

## 2020-10-31 DIAGNOSIS — Z170 Estrogen receptor positive status [ER+]: Principal | ICD-10-CM

## 2020-10-31 DIAGNOSIS — C50811 Malignant neoplasm of overlapping sites of right female breast: Principal | ICD-10-CM

## 2020-10-31 DIAGNOSIS — C50911 Malignant neoplasm of unspecified site of right female breast: Principal | ICD-10-CM

## 2020-10-31 DIAGNOSIS — Z886 Allergy status to analgesic agent status: Principal | ICD-10-CM

## 2020-10-31 DIAGNOSIS — Z79899 Other long term (current) drug therapy: Principal | ICD-10-CM

## 2020-10-31 DIAGNOSIS — Z882 Allergy status to sulfonamides status: Principal | ICD-10-CM

## 2020-10-31 NOTE — Unmapped (Signed)
RADIATION ONCOLOGY FOLLOW-UP VISIT NOTE     Encounter Date: 10/31/2020  Patient Name: GURTHA PICKER  Medical Record Number: [id number]    DIAGNOSIS:  75yo with a pT1cN1 R breast IDC (ER/PR pos, HER2 neg) s/p partial mastectomy/SLN (1.7cm, grade 2, no LVI, 1/1 SLN involved)    DURATION SINCE COMPLETION OF RADIOTHERAPY:  NA    ASSESSMENT:  Disease Status: Treatment incomplete    RECOMMENDATIONS:  We reviewed her clinical course to date including the results of her recent surgery demonstrating a positive SLN.  She has subsequently seen med onc and with a very low oncotype score, chemotherapy was not recommended.  She does fit Z0011 criteria and we discussed radiation as opposed to ALND for axillary management.  She does have microscopic ECE on the LN- although this is certainly a negative prognostic factor, it is not clear that this necessitates ALND and there is data identifying low recurrence risk in individuals wiith >78mm ECE who receive radiation Darl Pikes Annals of surg onc 2020).  As such we reviewed radiation as the next step in management.  We discussed the rationale for post-lumpectomy radiation in improving locoregional control and survival.  We reviewed the logistics of radiation treatment including  daily treatment M-F for 6 weeks.  We discussed potential acute side effects including fatigue and skin reaction.  We also discussed late complications of RT including lymphedema, rib fracture, cardiac toxicity, lung toxicity, secondary malignancy, and impact on cosmesis.     We discussed the risks, benefits, side effects, and alternative treatments. The possibility of severe/permenant radiation damage to normal tissues within the radiated area were discussed.  Signed, witnessed consent was obtained.    INTERVAL HISTORY:      Since our initial consultation on 08/08/2020, Ms. Miner has had surgery for her breast cancer.  She underwent partial mastectomy/SLN with Dr. Tama Gander on 09/03/2020.  Final pathology demonstrated a 1.7cm focus of grade 2 IDC.  + grade 2 DCIS (>5.5cm) with EIC.  No LVI, neg margins (<69mm for DCIS), 1/1 LN involved (3.5cm, + >1mm ECE).  HER2 was not able to be run off of the core biopsy and was run off of the lumpectomy specimen- FISH was negative for HER2 amplification.  She met with med onc (Dr. Archie Balboa) on 5/9 who ordered an oncotype which returned as a score of 8- decision made to omit chemotherapy.    Today in clinic she is feeling well without any breast-specific issues.  No issues with pain or new breast masses.  Some ROM issues but nothing too severe.  She is accompanied in clinic by her daughters and between them and her husband they will be able to transport her to/from treatments.    REVIEW OF SYSTEMS:  A comprehensive review of 10 systems was negative except for pertinent positives noted in HPI.    PAST MEDICAL HISTORY/FAMILY HISTORY/SOCIAL HISTORY:  Reviewed in EPIC    ALLERGIES/MEDICATIONS:  Reviewed in EPIC    PHYSICAL EXAM:  Vital Signs for this encounter:   There were no vitals taken for this visit.  Karnofsky/Lansky Performance Status: 80, Normal activity with effort; some signs or symptoms of disease (ECOG equivalent 1)  General:   No acute distress, alert and oriented X 4   Head: Normocephalic, without obvious abnormality, atraumatic   Eyes: EOMI, no scleral icterus  Lungs:Normal work of breathing  Heart: RRR, S1/S2 normal, no murmur/rub/gallop  Extremities: Extremities normal, atraumatic.  No edema in bilateral upper extremities.  Lymph nodes: No palpable axillary  or supraclavicular lymphadenopathy  Neurologic: Grossly normal  Breast:  Bilateral breast exam performed.  Well-healed surgical scar over R breast.  No palpable mass in the R breast, no overlying skin changes.  No palpable mass in the L breast, no overlying skin changes    RADIOLOGY:  No new imaging    Labs:    No results found for: WBC, HGB, HCT, PLT, LDH, CREATININE, AST, ALT, MG    Rayetta Humphrey, MD  Assistant Professor  [facility] Dept of Radiation Oncology  10/31/2020

## 2020-10-31 NOTE — Unmapped (Signed)
Here to fu with Dr Carles Collet to set up radiation treatment planning.   Consent for treatment obtained and witnessed. And scanned into mosaiq

## 2020-11-01 NOTE — Unmapped (Signed)
CT/Sim all set up for [facility] on 6/8 at 2pm.  Tried to call Aram Beecham the daughter but she did not answer and no voicemail set up

## 2020-11-07 DIAGNOSIS — Z882 Allergy status to sulfonamides status: Principal | ICD-10-CM

## 2020-11-07 DIAGNOSIS — F32A Depression, unspecified: Principal | ICD-10-CM

## 2020-11-07 DIAGNOSIS — K7460 Unspecified cirrhosis of liver: Principal | ICD-10-CM

## 2020-11-07 DIAGNOSIS — Z886 Allergy status to analgesic agent status: Principal | ICD-10-CM

## 2020-11-07 DIAGNOSIS — F1020 Alcohol dependence, uncomplicated: Principal | ICD-10-CM

## 2020-11-07 DIAGNOSIS — Z170 Estrogen receptor positive status [ER+]: Principal | ICD-10-CM

## 2020-11-07 DIAGNOSIS — Z79899 Other long term (current) drug therapy: Principal | ICD-10-CM

## 2020-11-07 DIAGNOSIS — C50911 Malignant neoplasm of unspecified site of right female breast: Principal | ICD-10-CM

## 2020-11-07 DIAGNOSIS — I10 Essential (primary) hypertension: Principal | ICD-10-CM

## 2020-11-07 DIAGNOSIS — C50811 Malignant neoplasm of overlapping sites of right female breast: Principal | ICD-10-CM

## 2020-11-21 ENCOUNTER — Encounter
Admit: 2020-11-21 | Discharge: 2020-11-29 | Payer: MEDICARE | Attending: Radiation Oncology | Primary: Radiation Oncology

## 2020-11-22 DIAGNOSIS — F418 Other specified anxiety disorders: Principal | ICD-10-CM

## 2020-11-22 MED ORDER — ESCITALOPRAM 20 MG TABLET
ORAL_TABLET | Freq: Every day | ORAL | 0 refills | 90 days | Status: CP
Start: 2020-11-22 — End: ?

## 2020-11-22 NOTE — Unmapped (Signed)
Patient is requesting the following refill  Requested Prescriptions     Pending Prescriptions Disp Refills   ??? escitalopram oxalate (LEXAPRO) 20 MG tablet [Pharmacy Med Name: ESCITALOPRAM 20MG  TABLETS] 90 tablet 1     Sig: TAKE 1 TABLET(20 MG) BY MOUTH DAILY       Recent Visits  No visits were found meeting these conditions.  Showing recent visits within past 365 days with a meds authorizing provider and meeting all other requirements  Future Appointments  No visits were found meeting these conditions.  Showing future appointments within next 365 days with a meds authorizing provider and meeting all other requirements       Labs: GAD7: No flowsheet data found.

## 2020-11-27 DIAGNOSIS — I10 Essential (primary) hypertension: Principal | ICD-10-CM

## 2020-11-27 DIAGNOSIS — C50911 Malignant neoplasm of unspecified site of right female breast: Principal | ICD-10-CM

## 2020-11-27 DIAGNOSIS — F1020 Alcohol dependence, uncomplicated: Principal | ICD-10-CM

## 2020-11-27 DIAGNOSIS — K7460 Unspecified cirrhosis of liver: Principal | ICD-10-CM

## 2020-11-27 DIAGNOSIS — Z886 Allergy status to analgesic agent status: Principal | ICD-10-CM

## 2020-11-27 DIAGNOSIS — Z882 Allergy status to sulfonamides status: Principal | ICD-10-CM

## 2020-11-27 DIAGNOSIS — Z170 Estrogen receptor positive status [ER+]: Principal | ICD-10-CM

## 2020-11-27 DIAGNOSIS — F32A Depression, unspecified: Principal | ICD-10-CM

## 2020-11-27 DIAGNOSIS — Z79899 Other long term (current) drug therapy: Principal | ICD-10-CM

## 2020-11-28 DIAGNOSIS — Z886 Allergy status to analgesic agent status: Principal | ICD-10-CM

## 2020-11-28 DIAGNOSIS — F32A Depression, unspecified: Principal | ICD-10-CM

## 2020-11-28 DIAGNOSIS — F1020 Alcohol dependence, uncomplicated: Principal | ICD-10-CM

## 2020-11-28 DIAGNOSIS — Z170 Estrogen receptor positive status [ER+]: Principal | ICD-10-CM

## 2020-11-28 DIAGNOSIS — Z79899 Other long term (current) drug therapy: Principal | ICD-10-CM

## 2020-11-28 DIAGNOSIS — C50911 Malignant neoplasm of unspecified site of right female breast: Principal | ICD-10-CM

## 2020-11-28 DIAGNOSIS — Z882 Allergy status to sulfonamides status: Principal | ICD-10-CM

## 2020-11-28 DIAGNOSIS — I10 Essential (primary) hypertension: Principal | ICD-10-CM

## 2020-11-28 DIAGNOSIS — K7460 Unspecified cirrhosis of liver: Principal | ICD-10-CM

## 2020-11-28 NOTE — Unmapped (Signed)
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    Narrative: First treatment today. No complaints.    Assessment & Plan    200 cGy of planned 500 cGy    Tumor/Palliative response: Not applicable   Chemotherapy/Systemic therapy:not administered  Clinical Trial:   no    Plan for Therapy: Continue treatment as planned    Therapeutic Interventions (new in bold)    Eucerin    Toxicity    None    Physical Examination    Wt Readings from Last 10 Encounters:   10/08/20 (!) 106.5 kg (234 lb 14.4 oz)   09/20/20 (!) 103.5 kg (228 lb 3.2 oz)   09/03/20 (!) 105 kg (231 lb 7.7 oz)   07/25/20 100.3 kg (221 lb 3.2 oz)   11/14/19 (!) 104.8 kg (231 lb)   07/18/19 (!) 103.4 kg (228 lb)   04/13/18 97.5 kg (215 lb)   04/08/18 98.1 kg (216 lb 4.8 oz)   02/03/18 93.9 kg (207 lb)   01/11/18 93 kg (205 lb)     BP Readings from Last 1 Encounters:   10/31/20 196/81     Pulse Readings from Last 1 Encounters:   10/31/20 79     SpO2 Readings from Last 1 Encounters:   10/31/20 98%       Alert and Orientated X 3.  No acute distress.    Kayleen Memos. Camelia Phenes, MD  Assistant Professor  Department of Radiation Oncology  [facility]  [address], [address]  [place], [place] [address]  O: [phone]  11/28/20 10:06 AM

## 2020-11-28 NOTE — Unmapped (Signed)
11/28/2020    Dose Site Summary:  Rx:Rt Sclav: 11/28/2020: 200/5,000 cGy  Rx:Rt Breast: 11/28/2020: 200/5,000 cGy  Subjective/Assessment/Recommendations:    1. Skin: instructions given.  Has eucerin cream that she uses.

## 2020-11-30 ENCOUNTER — Encounter
Admit: 2020-11-30 | Discharge: 2020-12-30 | Payer: MEDICARE | Attending: Radiation Oncology | Primary: Radiation Oncology

## 2020-11-30 ENCOUNTER — Encounter: Admit: 2020-11-30 | Discharge: 2020-12-30 | Payer: MEDICARE

## 2020-12-05 DIAGNOSIS — Z79899 Other long term (current) drug therapy: Principal | ICD-10-CM

## 2020-12-05 DIAGNOSIS — F1020 Alcohol dependence, uncomplicated: Principal | ICD-10-CM

## 2020-12-05 DIAGNOSIS — I10 Essential (primary) hypertension: Principal | ICD-10-CM

## 2020-12-05 DIAGNOSIS — Z170 Estrogen receptor positive status [ER+]: Principal | ICD-10-CM

## 2020-12-05 DIAGNOSIS — Z886 Allergy status to analgesic agent status: Principal | ICD-10-CM

## 2020-12-05 DIAGNOSIS — F32A Depression, unspecified: Principal | ICD-10-CM

## 2020-12-05 DIAGNOSIS — C50911 Malignant neoplasm of unspecified site of right female breast: Principal | ICD-10-CM

## 2020-12-05 DIAGNOSIS — K7460 Unspecified cirrhosis of liver: Principal | ICD-10-CM

## 2020-12-05 DIAGNOSIS — Z882 Allergy status to sulfonamides status: Principal | ICD-10-CM

## 2020-12-05 NOTE — Unmapped (Signed)
RADIATION TREATMENT MANAGEMENT NOTE     Encounter Date: 12/05/2020  Patient Name: [name]  Medical Record Number: [id number]    DIAGNOSIS:  [name] is a 75yo with a pT1cN1 R breast IDC (ER/PR pos, HER2 neg) s/p partial mastectomy/SLN (1.7cm, grade 2, no LVI, 1/1 SLN involved).    ASSESSMENT:     Whole breast/regional nodes:  1000 cGy of planned 5000 cGy  Tumor bed boost: 0cGy of planned 1000cGy  Karnofsky/Lansky Performance Status: 70, Cares for self; unable to carry on normal activity or to do active work (ECOG equivalent 1)  Chemotherapy/Systemic therapy:not administered  Clinical Trial:   no    RECOMMENDATIONS:  1. Plan for Therapy: Continue treatment as planned  2. Skin:  Reviewed skin care recommendations- using eucerin for now.  Given plan dosimetry I would anticipate a fair amount of skin irritation as we proceed through treatment and will monitor closely  3. Pain: None  4. Follow-up: For status check next week    SUBJECTIVE: Overall doing well with treatment without any issues.  She has not noticed any skin changes, good energy.  Accompanied in clinic by her daughter.     PHYSICAL EXAM:  Vital Signs for this encounter:  BP 178/73  - Pulse 79  - Temp 37.4 ??C (99.3 ??F) (Temporal)  - Resp 16  - Wt (!) 106 kg (233 lb 9.6 oz)  - SpO2 96%  - BMI 42.73 kg/m??   Last weight:    Wt Readings from Last 4 Encounters:   12/05/20 (!) 106 kg (233 lb 9.6 oz)   10/08/20 (!) 106.5 kg (234 lb 14.4 oz)   09/20/20 (!) 103.5 kg (228 lb 3.2 oz)   09/03/20 (!) 105 kg (231 lb 7.7 oz)     General:  Alert and Orientated X 3.  No acute distress.    Skin: Faint erythema developing over the R breast, no skin breakdown    I have reviewed the patient's dose delivery, dosimetry, lab tests, patient treatment set-up, port films, treatment parameters and x-rays.    Rayetta Humphrey, MD  Assistant Professor  [facility] Dept of Radiation Oncology  12/05/2020

## 2020-12-05 NOTE — Unmapped (Signed)
Mrs. [name] here for status check with Dr. Carles Collet.    SiteLast TxDose  Rx:Rt Sclav: 12/05/2020: 1,000/5,000 cGy  Rx:Rt Breast: 12/05/2020: 1,000/5,000 cGy    Skin: Skin intact, no skin issues noted today  ROM: Normal  Pain/Swelling/Lymphedema: No pain or edema   Energy Level/Fatigue/Active Level: A little fatigued and had an episode of nausea but thinks it was what she ate  Elimination: No issues  Appetite/Diet: Adequate  Nausea/Vomitting: None   Weight: Stable  Psychosocial: No needs    Notified Dr. Carles Collet.

## 2020-12-06 ENCOUNTER — Encounter: Admit: 2020-12-06 | Discharge: 2020-12-07 | Payer: MEDICARE

## 2020-12-06 DIAGNOSIS — G9340 Encephalopathy, unspecified: Principal | ICD-10-CM

## 2020-12-06 DIAGNOSIS — J320 Chronic maxillary sinusitis: Principal | ICD-10-CM

## 2020-12-12 DIAGNOSIS — C50911 Malignant neoplasm of unspecified site of right female breast: Principal | ICD-10-CM

## 2020-12-12 DIAGNOSIS — I10 Essential (primary) hypertension: Principal | ICD-10-CM

## 2020-12-12 DIAGNOSIS — K7460 Unspecified cirrhosis of liver: Principal | ICD-10-CM

## 2020-12-12 DIAGNOSIS — Z170 Estrogen receptor positive status [ER+]: Principal | ICD-10-CM

## 2020-12-12 DIAGNOSIS — Z882 Allergy status to sulfonamides status: Principal | ICD-10-CM

## 2020-12-12 DIAGNOSIS — F1020 Alcohol dependence, uncomplicated: Principal | ICD-10-CM

## 2020-12-12 DIAGNOSIS — F32A Depression, unspecified: Principal | ICD-10-CM

## 2020-12-12 DIAGNOSIS — Z79899 Other long term (current) drug therapy: Principal | ICD-10-CM

## 2020-12-12 DIAGNOSIS — Z886 Allergy status to analgesic agent status: Principal | ICD-10-CM

## 2020-12-12 DIAGNOSIS — C50811 Malignant neoplasm of overlapping sites of right female breast: Principal | ICD-10-CM

## 2020-12-12 NOTE — Unmapped (Signed)
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    ASSESSMENT:     Whole breast/regional nodes:  2000 cGy of planned 5000 cGy  Tumor bed boost: 0cGy of planned 1000cGy  Karnofsky/Lansky Performance Status: 70, Cares for self; unable to carry on normal activity or to do active work (ECOG equivalent 1)  Chemotherapy/Systemic therapy:not administered  Clinical Trial:   no    RECOMMENDATIONS:  1. Plan for Therapy: Continue treatment as planned  2. Skin:  Reviewed skin care recommendations- using eucerin for now.  Given plan dosimetry I would anticipate a fair amount of skin irritation as we proceed through treatment and will monitor closely  3. Pain: None  4. Follow-up: For status check next week    SUBJECTIVE: Overall continues to do well with treatment without any issues. Accompanied in clinic by her daughter. On Covid precautions, but feels ok.    PHYSICAL EXAM:  Vital Signs for this encounter:  There were no vitals taken for this visit.  Last weight:    Wt Readings from Last 4 Encounters:   12/05/20 (!) 106 kg (233 lb 9.6 oz)   10/08/20 (!) 106.5 kg (234 lb 14.4 oz)   09/20/20 (!) 103.5 kg (228 lb 3.2 oz)   09/03/20 (!) 105 kg (231 lb 7.7 oz)     General:  Alert and Orientated X 3.  No acute distress.    Skin: Mild erythema developing over the R breast, no skin breakdown    I have reviewed the patient's dose delivery, dosimetry, lab tests, patient treatment set-up, port films, treatment parameters and x-rays.    Thom Chimes, MD  Professor  [facility] Dept of Radiation Oncology  12/12/2020

## 2020-12-19 DIAGNOSIS — C50911 Malignant neoplasm of unspecified site of right female breast: Principal | ICD-10-CM

## 2020-12-19 DIAGNOSIS — Z79899 Other long term (current) drug therapy: Principal | ICD-10-CM

## 2020-12-19 DIAGNOSIS — K7460 Unspecified cirrhosis of liver: Principal | ICD-10-CM

## 2020-12-19 DIAGNOSIS — Z886 Allergy status to analgesic agent status: Principal | ICD-10-CM

## 2020-12-19 DIAGNOSIS — F32A Depression, unspecified: Principal | ICD-10-CM

## 2020-12-19 DIAGNOSIS — Z170 Estrogen receptor positive status [ER+]: Principal | ICD-10-CM

## 2020-12-19 DIAGNOSIS — Z882 Allergy status to sulfonamides status: Principal | ICD-10-CM

## 2020-12-19 DIAGNOSIS — I10 Essential (primary) hypertension: Principal | ICD-10-CM

## 2020-12-19 DIAGNOSIS — F1020 Alcohol dependence, uncomplicated: Principal | ICD-10-CM

## 2020-12-19 NOTE — Unmapped (Signed)
RADIATION TREATMENT MANAGEMENT NOTE     Encounter Date: 12/19/2020  Patient Name: [name]  Medical Record Number: [id number]    DIAGNOSIS:  [name] is a 75yo with a pT1cN1 R breast IDC (ER/PR pos, HER2 neg) s/p partial mastectomy/SLN (1.7cm, grade 2, no LVI, 1/1 SLN involved).    ASSESSMENT:     Whole breast/regional nodes:  3000 cGy of planned 5000 cGy  Tumor bed boost: 0cGy of planned 1000cGy  Karnofsky/Lansky Performance Status: 70, Cares for self; unable to carry on normal activity or to do active work (ECOG equivalent 1)  Chemotherapy/Systemic therapy:not administered  Clinical Trial:   no    RECOMMENDATIONS:  1. Plan for Therapy: Continue treatment as planned  2. Skin:  Reviewed skin care recommendations- using eucerin for now.  Given plan dosimetry I would anticipate a fair amount of skin irritation as we proceed through treatment and will monitor closely.  Provided interdry in clinic today for inframammary fold  3. Pain: None  4. Follow-up: For status check next week    SUBJECTIVE:  Doing well overall.  Recovering from covid without any significant respiratory issues today.  Skin is a little more red and uncomfortable but not too bothersome.      PHYSICAL EXAM:  Vital Signs for this encounter:  There were no vitals taken for this visit.  Last weight:    Wt Readings from Last 4 Encounters:   12/05/20 (!) 106 kg (233 lb 9.6 oz)   10/08/20 (!) 106.5 kg (234 lb 14.4 oz)   09/20/20 (!) 103.5 kg (228 lb 3.2 oz)   09/03/20 (!) 105 kg (231 lb 7.7 oz)     General:  Alert and Orientated X 3.  No acute distress.    Skin: Moderate erythema developing over the R breast, no skin breakdown    I have reviewed the patient's dose delivery, dosimetry, lab tests, patient treatment set-up, port films, treatment parameters and x-rays.    [name] Humphrey, MD  Assistant Professor  [facility] Dept of Radiation Oncology  12/19/2020

## 2020-12-19 NOTE — Unmapped (Signed)
12/19/2020    Dose Site Summary:  Rx:Rt Sclav: 12/19/2020: 3,000/5,000 cGy  Rx:Rt Breast: 12/19/2020: 3,000/5,000 cGy  Subjective/Assessment/Recommendations:    1. Skin: red and hot under breast. Given interdry with instructions.   2. Nutrition:   3. Fatigue: is up urinating at night ans sleeping during the day.

## 2020-12-26 DIAGNOSIS — Z79899 Other long term (current) drug therapy: Principal | ICD-10-CM

## 2020-12-26 DIAGNOSIS — F32A Depression, unspecified: Principal | ICD-10-CM

## 2020-12-26 DIAGNOSIS — I10 Essential (primary) hypertension: Principal | ICD-10-CM

## 2020-12-26 DIAGNOSIS — C50911 Malignant neoplasm of unspecified site of right female breast: Principal | ICD-10-CM

## 2020-12-26 DIAGNOSIS — Z882 Allergy status to sulfonamides status: Principal | ICD-10-CM

## 2020-12-26 DIAGNOSIS — Z170 Estrogen receptor positive status [ER+]: Principal | ICD-10-CM

## 2020-12-26 DIAGNOSIS — Z886 Allergy status to analgesic agent status: Principal | ICD-10-CM

## 2020-12-26 DIAGNOSIS — F1020 Alcohol dependence, uncomplicated: Principal | ICD-10-CM

## 2020-12-26 DIAGNOSIS — K7460 Unspecified cirrhosis of liver: Principal | ICD-10-CM

## 2020-12-26 MED ORDER — MOMETASONE 0.1 % TOPICAL CREAM
2 refills | 0 days | Status: CP
Start: 2020-12-26 — End: 2021-12-26

## 2020-12-26 NOTE — Unmapped (Signed)
12/26/2020    Dose Site Summary:  Rx:Rt Sclav: 12/25/2020: 3,800/5,000 cGy  Rx:Rt Breast: 12/25/2020: 3,800/5,000 cGy  Rx:BST Rt Tbe: : 0/1,000 cGy  ZOX:WRUEA Rt T: 12/25/2020: 3,800 cGySubjective/Assessment/Recommendations:    1. Skin:   2. Nutrition:   3. Fatigue:   4. Pain:   5. Elimination:   6. Prescription Needs:   7. Psychosocial:   8. Other:

## 2020-12-26 NOTE — Unmapped (Signed)
RADIATION TREATMENT MANAGEMENT NOTE     Encounter Date: 12/26/2020  Patient Name: [name]  Medical Record Number: [id number]    DIAGNOSIS:  [name] is a 75yo with a pT1cN1 R breast IDC (ER/PR pos, HER2 neg) s/p partial mastectomy/SLN (1.7cm, grade 2, no LVI, 1/1 SLN involved).    ASSESSMENT:     Whole breast/regional nodes:  3800 cGy of planned 5000 cGy  Tumor bed boost: 0cGy of planned 1000cGy  Karnofsky/Lansky Performance Status: 70, Cares for self; unable to carry on normal activity or to do active work (ECOG equivalent 1)  Chemotherapy/Systemic therapy:not administered  Clinical Trial:   no    RECOMMENDATIONS:  1. Plan for Therapy: Continue treatment as planned  2. Skin:  Reviewed skin care recommendations- using eucerin for now.  Given plan dosimetry I would anticipate a fair amount of skin irritation as we proceed through treatment and will monitor closely.  Provided interdry in clinic today for inframammary fold.  Will start mometasone today and discussed use of topical antibiotic ointment for areas of skin breakdown (sulfa allergy)  3. Pain: None  4. Follow-up: For status check next week    SUBJECTIVE:  More skin irritation this week- skin diffusely red and she has gotten some focal areas of skin peeling underneath the breast.  It is itching throughout, but not painful.  Otherwise feeling OK    PHYSICAL EXAM:  Vital Signs for this encounter:  There were no vitals taken for this visit.  Last weight:    Wt Readings from Last 4 Encounters:   12/05/20 (!) 106 kg (233 lb 9.6 oz)   10/08/20 (!) 106.5 kg (234 lb 14.4 oz)   09/20/20 (!) 103.5 kg (228 lb 3.2 oz)   09/03/20 (!) 105 kg (231 lb 7.7 oz)     General:  Alert and Orientated X 3.  No acute distress.    Skin: Diffuse erythema developing over the R breast, focal skin breakdown in inframammary fold    I have reviewed the patient's dose delivery, dosimetry, lab tests, patient treatment set-up, port films, treatment parameters and x-rays.    Rayetta Humphrey, MD  Assistant Professor  [facility] Dept of Radiation Oncology  12/26/2020

## 2020-12-31 ENCOUNTER — Encounter: Admit: 2020-12-31 | Payer: MEDICARE | Attending: Radiation Oncology | Primary: Radiation Oncology

## 2020-12-31 ENCOUNTER — Encounter: Admit: 2020-12-31 | Payer: MEDICARE

## 2021-01-02 DIAGNOSIS — C50911 Malignant neoplasm of unspecified site of right female breast: Principal | ICD-10-CM

## 2021-01-02 DIAGNOSIS — F32A Depression, unspecified: Principal | ICD-10-CM

## 2021-01-02 DIAGNOSIS — I10 Essential (primary) hypertension: Principal | ICD-10-CM

## 2021-01-02 DIAGNOSIS — Z886 Allergy status to analgesic agent status: Principal | ICD-10-CM

## 2021-01-02 DIAGNOSIS — Z79899 Other long term (current) drug therapy: Principal | ICD-10-CM

## 2021-01-02 DIAGNOSIS — F1020 Alcohol dependence, uncomplicated: Principal | ICD-10-CM

## 2021-01-02 DIAGNOSIS — K7460 Unspecified cirrhosis of liver: Principal | ICD-10-CM

## 2021-01-02 DIAGNOSIS — Z882 Allergy status to sulfonamides status: Principal | ICD-10-CM

## 2021-01-02 DIAGNOSIS — C50811 Malignant neoplasm of overlapping sites of right female breast: Principal | ICD-10-CM

## 2021-01-02 DIAGNOSIS — Z170 Estrogen receptor positive status [ER+]: Principal | ICD-10-CM

## 2021-01-02 NOTE — Unmapped (Signed)
RADIATION TREATMENT MANAGEMENT NOTE     Encounter Date: 01/02/2021  Patient Name: [name]  Medical Record Number: [id number]    DIAGNOSIS:  [name] is a 75yo with a pT1cN1 R breast IDC (ER/PR pos, HER2 neg) s/p partial mastectomy/SLN (1.7cm, grade 2, no LVI, 1/1 SLN involved).    ASSESSMENT:     Whole breast/regional nodes:  5000 cGy of planned 5000 cGy  Tumor bed boost: 0cGy of planned 1000cGy  Karnofsky/Lansky Performance Status: 70, Cares for self; unable to carry on normal activity or to do active work (ECOG equivalent 1)  Chemotherapy/Systemic therapy:not administered  Clinical Trial:   no    RECOMMENDATIONS:  1. Plan for Therapy: Continue treatment as planned  2. Skin:  Reviewed skin care recommendations- using eucerin for now.  Given plan dosimetry I would anticipate a fair amount of skin irritation as we proceed through treatment and will monitor closely.  Provided interdry in clinic today for inframammary fold.  Continue mometasone for itching.  Continue topical antibiotic ointment for areas of skin breakdown (sulfa allergy)  3. Pain: More issues this week- discussed scheduled tylenol (she has been told not to take NSAIDs).  4. Endocrine:  Sees Dr. Archie Balboa next week to discuss endocrine therapy  5. Follow-up: For status check next week.  I have requested a 3 week follow-up to check on skin and a 6 month follow-up with mammogram for routine surviellance    SUBJECTIVE:  Skin is more uncomfortable this week with a slightly larger area of skin breakdown below the breast.  No itching, but generalized throbbing sensation throughout.  Taking tylenol occasionally.  She's tired but her daughter is encouraging her to be more active throughout the day.    PHYSICAL EXAM:  Vital Signs for this encounter:  There were no vitals taken for this visit.  Last weight:    Wt Readings from Last 4 Encounters:   12/05/20 (!) 106 kg (233 lb 9.6 oz)   10/08/20 (!) 106.5 kg (234 lb 14.4 oz)   09/20/20 (!) 103.5 kg (228 lb 3.2 oz)   09/03/20 (!) 105 kg (231 lb 7.7 oz)     General:  Alert and Orientated X 3.  No acute distress.    Skin: Diffuse erythema developing over the R breast.  There is a larger patch of breakdown in inframammary fold- none in axilla or throughout the remainder of the breast.    I have reviewed the patient's dose delivery, dosimetry, lab tests, patient treatment set-up, port films, treatment parameters and x-rays.    Rayetta Humphrey, MD  Assistant Professor  [facility] Dept of Radiation Oncology  01/02/2021

## 2021-01-02 NOTE — Unmapped (Signed)
01/02/2021    Dose Site Summary:  Rx:Rt Sclav: 01/02/2021: 5,000/5,000 cGy  Rx:Rt Breast: 01/02/2021: 5,000/5,000 cGy  Rx:BST Rt Tbe: : 0/1,000 cGy  YQM:VHQIO Rt T: 01/02/2021: 5,000 cGy  Subjective/Assessment/Recommendations:    1. Skin: skin very red with moist desquamation.  Under right breast..Given interdry and instructions on how to use

## 2021-01-09 DIAGNOSIS — C50911 Malignant neoplasm of unspecified site of right female breast: Principal | ICD-10-CM

## 2021-01-09 DIAGNOSIS — Z886 Allergy status to analgesic agent status: Principal | ICD-10-CM

## 2021-01-09 DIAGNOSIS — Z882 Allergy status to sulfonamides status: Principal | ICD-10-CM

## 2021-01-09 DIAGNOSIS — F32A Depression, unspecified: Principal | ICD-10-CM

## 2021-01-09 DIAGNOSIS — K7460 Unspecified cirrhosis of liver: Principal | ICD-10-CM

## 2021-01-09 DIAGNOSIS — Z79899 Other long term (current) drug therapy: Principal | ICD-10-CM

## 2021-01-09 DIAGNOSIS — Z170 Estrogen receptor positive status [ER+]: Principal | ICD-10-CM

## 2021-01-09 DIAGNOSIS — F1020 Alcohol dependence, uncomplicated: Principal | ICD-10-CM

## 2021-01-09 DIAGNOSIS — C50811 Malignant neoplasm of overlapping sites of right female breast: Principal | ICD-10-CM

## 2021-01-09 DIAGNOSIS — I10 Essential (primary) hypertension: Principal | ICD-10-CM

## 2021-01-09 NOTE — Unmapped (Signed)
RADIATION TREATMENT MANAGEMENT NOTE     Encounter Date: 01/09/2021  Patient Name: [name]  Medical Record Number: [id number]    DIAGNOSIS:  [name] is a 75yo with a pT1cN1 R breast IDC (ER/PR pos, HER2 neg) s/p partial mastectomy/SLN (1.7cm, grade 2, no LVI, 1/1 SLN involved).    ASSESSMENT:     Whole breast/regional nodes:  5000 cGy of planned 5000 cGy  Tumor bed boost: 1000cGy of planned 1000cGy  Karnofsky/Lansky Performance Status: 70, Cares for self; unable to carry on normal activity or to do active work (ECOG equivalent 1)  Chemotherapy/Systemic therapy:not administered  Clinical Trial:   no    RECOMMENDATIONS:  1. Plan for Therapy: Continue treatment as planned  2. Skin:  Reviewed skin care recommendations- using eucerin for now.  Given plan dosimetry I would anticipate a fair amount of skin irritation as we proceed through treatment and will monitor closely.  Provided interdry in clinic today for inframammary fold.  Continue mometasone for itching.  Continue topical antibiotic ointment for areas of skin breakdown (sulfa allergy)  3. Pain: More issues this week- discussed scheduled tylenol (she has been told not to take NSAIDs).  4. Endocrine:  Sees Dr. Archie Balboa next week to discuss endocrine therapy  5. Follow-up: For status check next week.  I have requested a 3 week follow-up to check on skin and a 6 month follow-up with mammogram for routine surviellance    SUBJECTIVE:  Skin is more uncomfortable this week with a slightly larger area of skin breakdown below the breast.  No itching, but generalized throbbing sensation throughout.  Taking tylenol occasionally.  She's tired but her daughter is encouraging her to be more active throughout the day.  Confluent moist desquimation inframmary fold.    PHYSICAL EXAM:  Vital Signs for this encounter:  There were no vitals taken for this visit.  Last weight:    Wt Readings from Last 4 Encounters:   12/05/20 (!) 106 kg (233 lb 9.6 oz)   10/08/20 (!) 106.5 kg (234 lb 14.4 oz)   09/20/20 (!) 103.5 kg (228 lb 3.2 oz)   09/03/20 (!) 105 kg (231 lb 7.7 oz)     General:  Alert and Orientated X 3.  No acute distress.    Skin: Diffuse erythema developing over the R breast.  There is a larger patch of breakdown in inframammary fold- none in axilla or throughout the remainder of the breast.    I have reviewed the patient's dose delivery, dosimetry, lab tests, patient treatment set-up, port films, treatment parameters and x-rays.    Thom Chimes, MD  Professor  [facility] Dept of Radiation Oncology  01/09/2021

## 2021-01-09 NOTE — Unmapped (Addendum)
01/09/2021    Dose Site Summary:  Rx:Rt Sclav: 01/02/2021: 5,000/5,000 cGy  Rx:Rt Breast: 01/02/2021: 5,000/5,000 cGy  Rx:BST Rt Tbe: 01/09/2021: 1,000/1,000 cGy  ZOX:WRUEA Rt T: 01/09/2021: 6,000 cGy  Subjective/Assessment/Recommendations:    1. Skin: continue to use neosporin on open areas twice a day with lotion in between. Taking tylenol twice a day. Given interdry and celebrated completing radiation treatments.

## 2021-01-14 ENCOUNTER — Encounter
Admit: 2021-01-14 | Discharge: 2021-01-15 | Payer: MEDICARE | Attending: Geriatric Medicine | Primary: Geriatric Medicine

## 2021-01-14 DIAGNOSIS — Z780 Asymptomatic menopausal state: Principal | ICD-10-CM

## 2021-01-14 DIAGNOSIS — Z170 Estrogen receptor positive status [ER+]: Principal | ICD-10-CM

## 2021-01-14 DIAGNOSIS — C50811 Malignant neoplasm of overlapping sites of right female breast: Principal | ICD-10-CM

## 2021-01-14 NOTE — Unmapped (Signed)
RADIATION ONCOLOGY TREATMENT COMPLETION NOTE    Encounter Date: 01/09/2021  Patient Name: RAHMAH MCCAMY  Medical Record Number: [id number]    Referring Physician: No referring provider defined for this encounter.    Primary Care Provider: Jenell Milliner, MD    DIAGNOSIS:  [name]??is a??75yo with a pT1cN1 R breast IDC (ER/PR pos, HER2 neg) s/p partial mastectomy/SLN (1.7cm, grade 2, no LVI, 1/1 SLN involved).    TREATMENT INTENT: curative    CLINICAL TRIAL: no    CHEMOTHERAPY: not administered    RADIATION TREATMENT SUMMARY:          Treatment site Treatment Technique/Modality Energy Dose per fraction Total number  of fractions Total dose Start date End date   R Breast and regional LN 3D CRT  200 cGy 25 5000cGy 11/28/2020   01/02/2021   R Breast tumor bed boost Enface Electrons 15 MeV 200 cGy 5 1000 cGy 01/03/2021 01/09/2021     COMPLETED INTENDED COURSE:  Yes    TREATMENT BREAK > 2 WEEKS:  No    TOLERANCE TO TREATMENT:  Moderate toxicities or complications requiring outpatient intervention(s)    FEEDING TUBE:  no    PLAN FOR FOLLOW-UP: [name] is to return for follow up with me/our group in 3 weeks and with Dr. Archie Balboa next week.      Genia Plants, MD  01/14/21 2:40 PM

## 2021-01-14 NOTE — Unmapped (Signed)
BREAST MEDICAL ONCOLOGY ENCOUNTER  ---------------------------------------------------------------------------------------------------------------------------------------------------------------------      Referring Physician: Olivia [name],*    PCP: [name] Milliner, MD    Consulting Physicians: Surgical oncology: [name] Roers, MD.  Radiation oncology: [name] Humphrey, MD.     Reason for Visit: The patient is seen in consultation at the request of Dr. Olivia [name] for evaluation of breast cancer.  -----------------------------------------------------------------------------------------------------  I personally spent 40 minutes face-to-face and non-face-to-face in the care of this patient, which includes all pre, intra, and post visit time on the date of service.  ----------------------------------------------------------------------------------------------------  Assessment: [name] is a 75 y.o. female from [facility] with screening detected (R) HR+/HER2 low (2+) IDC, pT1c pN1a, G2, 1.7 cm. S/p right partial mastectomy 09/03/20 and radiation 01/09/21. Oncotype testing 8, no adjuvant chemo.     [name] presents today to after completing radiation on 01/09/21 to discuss adjuvant endocrine therapy.  She experienced significant radiation skin toxicities but is otherwise doing fair with no clinical evidence of local recurrence or signs or symptoms consistent with disease.  Reviewed the concept of micrometastasis and the rational for adjuvant endocrine therapy. I explained since she has a HR positive disease she will require anti-estrogen therapy for 5-10 years to reduce the recurrence risk. She will start anastrozole 1 mg daily once cleared by rad onc on 01/22/21.     The risks and benefits of aromatase inhibitors (anastrozole, letrozole, and exemestane) were discussed in detail and the patient was informed of the following: Risks include the development of painful muscles and joints (arthralgia/myalgia) and bone loss. Muscle and joint pain can be severe but rarely result in any tissue damage; symptoms usually resolve in several weeks when the medication is stopped. Bone loss is common and a bone density test is recommended as a baseline and then yearly to every several years depending on initial results. The risk of fractures is increased by a few percent in patients taking these drugs, but careful monitoring of bone density and using bone protecting agents when indicated can minimize these risks. Unlike tamoxifen there is no increased risk of blood clots or endometrial cancer. AIs can cause or worsen vaginal dryness but women using these drugs should not use vaginal estrogen preparations for these symptoms. AIs can also cause or increase hot flashes. Any other symptoms should be reported.    She will require baseline bone density scan which I ordered today.  We reviewed the ASCO/NCCN follow-up guidelines.  She will return in 3 months for ongoing breast cancer surveillance.  She has contact information will call in the interval with any questions or concerns.    Plan    1.  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   Chemotherapy: Oncotype score- 8, no adjuvant chemo   Radiation: s/p whole breast radiation and tumor bed boost, completed 01/09/21  Endocrine therapy: Plan for 5-10 years of AI. Start anastrozole 1 mg daily after rad onc appt on 8/23.   Bone health: Obtain baseline DEXA on AI. Ordered today to be scheduled w/next appt   Breast imaging: Plan for routine imaging. Next scheduled on 07/03/20.   Systemic staging: PET from 10/25/20 negative for evidence of metastatic disease. Following w/PCP for GI findings  Genetics: None at this time.     2.  Supportive care  -- Hx of liver disease. Takes lactulose 3x daily. Follows with Dr. Raford [name].   -- Arthralgias. In back and right knee. Chronic. Takes Tylenol.   --  Back lesions. Abnormal hyperpigmented keratosis to right lower back. Referred to dermatologist.   -- Hot flashes: Vitamin E, 400 IU daily.   -- Bone health: start 1000 IU Vitamin D daily and 1200 mg Calcium per day. DEXA scan schedule w/next appt.     3. Follow-up  --  Recommend starting 800 IU Vitamin E daily,  1000 IU Vitamin D daily, and 1200 mg Calcium per day  --  Rx: Anastrozole 1 mg daily  --  Baseline DEXA scan at next appointment  --  Schedule follow up with NP, [name] in 04/2021.     ** Prefers [facility] **     -----------------------------------------------------------------------------------------------------  Interval history:   [name] presents today in wheelchair with daughter to discuss endocrine therapy. She is recuperating from radiation related burning after completing RT. She states this is improved today   -- Doing well overall. Spends more than half the day in the recliner due to choice.  -- Mild fatigue and anxiety.  -- Bleeding from right breast, secondary to radiation therapy, apparent on nightgown in mornings  -- Right knee arthralgias. Chronic. Stable today.   -- Lower back pain. Chronic. Worse with exertion. Takes Tylenol.   -- Back lesion. Non-tender.  -- No breast related complaints or concerns for recurrence.    -- Full 12 ROS reviewed and otherwise mild/none.     Review of Systems: A complete twelve systems review was obtained and is positive per the HPI but otherwise negative in detail. See MIMS #1170 where available.    Functional Status: ECOG PS 0. ADLS independent. IADLS independent. Uses wheelchair for long walks. Cognition intact.     Social: Lives in [place], [place] with husband, daughter [name], and her granddaughter. Has another daughter [name], three grandchildren, and a brother nearby. Married for 50 years. Retired. 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     History of the Present Illness: [name] is a pleasant 75 y.o. female who  has a past medical history of Alcoholism (CMS-HCC), Alcoholism /alcohol abuse, Cirrhosis (CMS-HCC), Depression, Hypertension, Liver disease, and Malignant neoplasm of overlapping sites of right breast in female, estrogen receptor positive (CMS-HCC) (10/03/2020). She is seen in consultation at the request of Dr. Olivia [name] for evaluation of breast cancer. She had an abnormal screening MMG 06/20/20. She had diagnostic MMG/US of right breast 07/23/20 with 0.7 x 0.6 x 0.6 cm irregular hypoechoic nonparallel mass with indistinct margins and peripheral vascularity at the 5:00 position approximately 8 CFN. Core bx of right breast 07/30/20 with IDC, G2, ER+(100%), PR+(100%), and HER-2 negative (2+ by IHC, FISH not amplified). She underwent right partial mastectomy 09/03/20. Final path: IDC, 1.7 cm in greatest dimension, G2, associated DCIS. Negative margins. 1/1 LN positive for carcinoma.     Oncology History Overview Note   Oncotype?     Malignant neoplasm of overlapping sites of right breast in female, estrogen receptor positive (CMS-HCC)   07/30/2020 Biopsy    A: Breast, right, core biopsy  - Invasive ductal carcinoma (see comment)  - Nottingham combined histologic grade: 2               Tubule formation: 3               Nuclear grade: 2  Mitotic score: 1  - Invasive carcinoma measures approximately 3.5 mm in this specimen  -  Solid papillary ductal carcinoma in situ  - Ancillary studies (see biomarker synoptic)               Estrogen receptor: Positive (100%)               Progesterone receptor: Positive (100%)               HER2 IHC: Equivocal (2+)  HER2 FISH: Insufficient cellularity     09/03/2020 Surgery    A.  Breast, right, partial mastectomy  - Invasive ductal carcinoma (see synoptic report)  - Tumor size: 17 mm in greatest dimension  - Ductal carcinoma in situ (DCIS), grade 2, solid papillary and cribriform types  - Size extent of DCIS: At least 55 mm   - Margin status for specimen A (see extended margins below)          Invasive carcinoma: Negative, <1 mm from anterior and inferior margins          Ductal carcinoma in situ: Negative, <1 mm from superior margin  - Ancillary studies reported on prior core biopsy [id number]          Estrogen receptor: Positive (100%)          Progesterone receptor: Positive (100%)          HER2 IHC: Equivocal (2+)          HER2 FISH: Insufficient cellularity (see comment)     B.  Breast, right, superior margin,excision  - Single microscopic focus of ductal carcinoma in situ  - Margin: Negative, <1 mm    C.  Breast, right, lateral margin, excision  - Negative for carcinoma    D.  Breast, right,  inferior margin, excision  - Negative for carcinoma    E.  Breast, right, medial margin, excision  - Negative for carcinoma     F. Right axilla, sentinel lymph node, biopsy  - One lymph node positive for carcinoma (1/1)     11/08/2020 -  Radiation    Radiation Therapy Treatment Details (Noted on 11/08/2020)  Site: Right Breast  Technique: 3D CRT  Goal: No goal specified  Planned Treatment Start Date: No planned start date specified         Patient Active Problem List   Diagnosis   ??? Alcoholic liver disease (CMS-HCC)   ??? Hypokalemia   ??? HTN (hypertension)   ??? Chronic maxillary sinusitis   ??? Alcoholism (CMS-HCC)   ??? Encephalopathy   ??? Depression with anxiety   ??? Obesity   ??? Macrocytosis   ??? DJD (degenerative joint disease)   ??? Malignant neoplasm of overlapping sites of right breast in female, estrogen receptor positive (CMS-HCC)       Past Medical History:   Diagnosis Date   ??? Alcoholism (CMS-HCC)    ??? Alcoholism /alcohol abuse    ??? Cirrhosis (CMS-HCC)    ??? Depression    ??? Hypertension    ??? Liver disease    ??? Malignant neoplasm of overlapping sites of right breast in female, estrogen receptor positive (CMS-HCC) 10/03/2020       Past Surgical History:   Procedure Laterality Date   ??? BREAST BIOPSY Right     benign-a long time ago   ??? CHOLECYSTECTOMY     ??? PR BX/REMV,LYMPH NODE,DEEP AXILL Right 09/03/2020    Procedure: BX/EXC LYMPH NODE; OPEN, DEEP AXILRY NODE;  Surgeon: Aris Everts, MD;  Location: ASC OR [facility];  Service: Surgical Oncology Breast   ??? PR INTRAOPERATIVE  SENTINEL LYMPH NODE ID W DYE INJECTION Right 09/03/2020    Procedure: INTRAOPERATIVE IDENTIFICATION SENTINEL LYMPH NODE(S) INCLUDE INJECTION NON-RADIOACTIVE DYE, WHEN PERFORMED;  Surgeon: Aris Everts, MD;  Location: ASC OR [facility];  Service: Surgical Oncology Breast   ??? PR MASTECTOMY, PARTIAL Right 09/03/2020    Procedure: MASTECTOMY, PARTIAL (EG, LUMPECTOMY, TYLECTOMY, QUADRANTECTOMY, SEGMENTECTOMY);  Surgeon: Aris Everts, MD;  Location: ASC OR [facility];  Service: Surgical Oncology Breast   ??? PR UPPER GI ENDOSCOPY,BIOPSY N/A 02/03/2018    Procedure: UGI ENDOSCOPY; WITH BIOPSY, SINGLE OR MULTIPLE;  Surgeon: Alfred Levins, MD;  [facility] PROCED[facility];  Service: Gastroenterology   ??? TUBAL LIGATION       Gyn History: G2P2. Menopause in her 27s. Denies HRT.     Medications:  Current Outpatient Medications   Medication Sig Dispense Refill   ??? acetaminophen (TYLENOL) 325 MG tablet Take 2 tablets (650 mg total) by mouth every six (6) hours as needed for pain.  0   ??? carvediloL (COREG) 3.125 MG tablet Take 1 tablet (3.125 mg total) by mouth Two (2) times a day. 180 tablet 1   ??? cyanocobalamin 1000 MCG tablet Take 1,000 mcg by mouth daily.     ??? escitalopram oxalate (LEXAPRO) 20 MG tablet Take 1 tablet (20 mg total) by mouth in the morning. TAKE 1 TABLET(20 MG) BY MOUTH DAILY. 90 tablet 0   ??? folic acid (FOLVITE) 1 MG tablet Take 1 tablet (1 mg total) by mouth daily. 90 tablet 1   ??? lactulose (CHRONULAC) 10 gram/15 mL solution TAKE 15 ML BY MOUTH THREE TIMES DAILY, TAKE ENOUGH TO HAVE 3 BOWEL MOVEMENTS PER DAY 1350 mL 5   ??? magnesium oxide (MAG-OX) 400 mg (241.3 mg elemental magnesium) tablet TAKE 1 TABLET(400 MG) BY MOUTH TWICE DAILY 180 tablet 1   ??? mometasone (ELOCON) 0.1 % cream Apply to affected area daily 50 g 2   ??? potassium chloride (KLOR-CON) 20 MEQ CR tablet Take 1 tablet (20 mEq total) by mouth Two (2) times a day. 180 tablet 1     No current facility-administered medications for this visit.       Allergies:  Allergies   Allergen Reactions   ??? Sulfa (Sulfonamide Antibiotics) Anaphylaxis   ??? Sulfur Anaphylaxis     Tolerated sulfur colloid injection without incident 09/03/2020.   ??? Aspirin      thrombocytopenia       Family History: Cancer-related family history is negative for Breast cancer, Cancer, Colon cancer, and Ovarian cancer.    Social History:   Social History     Social History Narrative   ??? Not on file     Physical Examination:  Vital Signs: BP 149/69  - Pulse 68  - Temp 36.4 ??C (97.6 ??F)  - Resp 17  - Wt (!) 106.1 kg (234 lb)  - SpO2 98%  - BMI 42.80 kg/m??   General: Healthy-appearing patient in no acute distress.Marland Kitchen  HEENT: PERRTLA, EOMI, OP clear  Cardiovascular: Normal S1 and S2. RRR. No audible murmurs.  Respiratory: Chest clear to percussion and auscultation.   Gastrointestinal: Abdomen soft without masses and tenderness, no hepatosplenomegaly or other masses.   Musculoskeletal: No bony pain or tenderness.   Skin: Abnormal hyperpigmented keratosis of lower right back. Two macules, not concerning.  Breasts: (R) breast: Extensive radiation related burning across breast and extending into axilla. Well healed incision in UOQ. Stigmata from recent drain removal. Healed incision in inferior aspect extending  down to the inframammary fold. (L) breast: benign. No axillary adenopathy bilat.   Neurologic:  Alert and oriented. Grossly non-focal  Lymphatic: No cervical, axillary or supraclavicular adenopathy.   Extremity: No lower extremity edema or upper extremity lymphedema    Photo from 01/14/21          DATA REVIEW:    Laboratory: I personally reviewed the pertinent laboratory data.    Radiology: I personally reviewed the pertinent imaging.    Pathology: I personally reviewed the pathology report.    I attest that I, Lilly Cove, personally documented this note while acting as scribe for Jilda Panda, MD.      Lilly Cove, Scribe.  01/14/2021       Documentation assistance provided by Medical Scribe, Lilly Cove, who was present during the entirety of the visit. I reviewed the note below and validated all of the information provided to ensure accuracy and completeness.     Jilda Panda, MD

## 2021-01-23 DIAGNOSIS — K7460 Unspecified cirrhosis of liver: Principal | ICD-10-CM

## 2021-01-23 DIAGNOSIS — F32A Depression, unspecified: Principal | ICD-10-CM

## 2021-01-23 DIAGNOSIS — Z170 Estrogen receptor positive status [ER+]: Principal | ICD-10-CM

## 2021-01-23 DIAGNOSIS — I10 Essential (primary) hypertension: Principal | ICD-10-CM

## 2021-01-23 DIAGNOSIS — Z79899 Other long term (current) drug therapy: Principal | ICD-10-CM

## 2021-01-23 DIAGNOSIS — Z886 Allergy status to analgesic agent status: Principal | ICD-10-CM

## 2021-01-23 DIAGNOSIS — Z882 Allergy status to sulfonamides status: Principal | ICD-10-CM

## 2021-01-23 DIAGNOSIS — C50911 Malignant neoplasm of unspecified site of right female breast: Principal | ICD-10-CM

## 2021-01-23 DIAGNOSIS — C50811 Malignant neoplasm of overlapping sites of right female breast: Principal | ICD-10-CM

## 2021-01-23 DIAGNOSIS — F1020 Alcohol dependence, uncomplicated: Principal | ICD-10-CM

## 2021-01-23 NOTE — Unmapped (Signed)
RADIATION ONCOLOGY FOLLOW-UP VISIT NOTE     Encounter Date: 01/23/2021  Patient Name: [name]  Medical Record Number: [id number]    DIAGNOSIS:  [name]??is a??75yo with a pT1cN1 R breast IDC (ER/PR pos, HER2 neg) s/p partial mastectomy/SLN (1.7cm, grade 2, no LVI, 1/1 SLN involved).  She received adjuvant radiation to breast/regional LN (60Gy finished 01/09/2021)    DURATION SINCE COMPLETION OF RADIOTHERAPY:  2 weeks (01/09/2021)    ASSESSMENT:  Disease Status: Not assessed    RECOMMENDATIONS:  1. FOLLOW-UP:  Med onc in 3 months and rad onc in 6 months (already scheduled)  2. Skin:  Healing very well- no more areas of skin breakdown.  Encouraged continued use of moisturizer, she can discontinue neosporin.  No other interventions needed today  3. Endocrine:  Anastrazole per med onc- she has not received prescription yet and I'll reach out to med onc    INTERVAL HISTORY:  Doing well today.  She had significant skin irritation after RT with confluent areas of skin breakdown in the inframammary fold and axilla, but these have significantly improved over the past week.  At this point she has no further open areas, no bleeding.  Rare shooting pain in her breast, mostly when she is reaching for things.  Otherwise she feels well.  She met with med onc the other day and will start anastrazole- her drug store had not received the prescription when she last checked.     REVIEW OF SYSTEMS:  A comprehensive review of 10 systems was negative except for pertinent positives noted in HPI.    PAST MEDICAL HISTORY/FAMILY HISTORY/SOCIAL HISTORY:  Reviewed in EPIC    ALLERGIES/MEDICATIONS:  Reviewed in EPIC    PHYSICAL EXAM:  Vital Signs for this encounter:   There were no vitals taken for this visit.  Karnofsky/Lansky Performance Status: 60, Requires occasional assistance, but is able to care for most of his personal needs (ECOG equivalent 2)  General:   No acute distress, alert and oriented X 4   Skin:  Hyperpigmentation over the R breast with no areas of skin breakdown.    RADIOLOGY:  None    Labs:    No results found for: WBC, HGB, HCT, PLT, LDH, CREATININE, AST, ALT, MG    Rayetta Humphrey, MD  Assistant Professor  [facility] Dept of Radiation Oncology  01/23/2021

## 2021-01-23 NOTE — Unmapped (Signed)
Here to fu for skin check after completing radiation. States she is continuing to use the neosporin under her breast and no lotion. Denies pain Skin has completely healed but appears very dry. Instructed to stop the neosporin and go back to the eucerin cream to her whole breast.

## 2021-01-28 NOTE — Unmapped (Incomplete)
Hi,    Patient [name] called requesting a medication refill for the following:    ??? Medication: Anastrozole  ??? Dosage: 1 mg  ??? Days left of medication: 0  ??? Pharmacy: [id number]    Program: {Onc Disease GNFA:21308}  Speciality: {Onc Specialty List :63867}         [x]  Preferred Name   [x]  DOB and/or MR#  [x]  Preferred Contact Method  [x]  Phone Number(s)   [x]  Preferred Pharmacy   [x]  MyChart     Thank you,  Cipriano Mile  [facility]  [phone]

## 2021-01-29 DIAGNOSIS — Z170 Estrogen receptor positive status [ER+]: Principal | ICD-10-CM

## 2021-01-29 DIAGNOSIS — C50811 Malignant neoplasm of overlapping sites of right female breast: Principal | ICD-10-CM

## 2021-01-29 MED ORDER — ANASTROZOLE 1 MG TABLET
ORAL_TABLET | Freq: Every day | ORAL | 2 refills | 30 days | Status: CP
Start: 2021-01-29 — End: 2022-01-29

## 2021-02-13 NOTE — Unmapped (Signed)
Hi,     Mikey Bussing contacted the [employer] requesting to speak with the care team of [name] to discuss:    Questions regarding vitamin E and Vitamin E patient should begin taking as well as dosage.    Please contact Mikey Bussing at [phone].    Thank you,   Yolanda Bonine  [facility]   [phone]

## 2021-02-13 NOTE — Unmapped (Signed)
Per Irving Burton, triage rn left message on identified vm that proper dosages should be:    Vit E  800 I.u. per day  Calcium  1200   Vit D  1000 I.u. per day    Call back number provided

## 2021-02-16 DIAGNOSIS — K766 Portal hypertension: Principal | ICD-10-CM

## 2021-02-16 DIAGNOSIS — I10 Essential (primary) hypertension: Principal | ICD-10-CM

## 2021-02-16 MED ORDER — CARVEDILOL 3.125 MG TABLET
ORAL_TABLET | 1 refills | 0 days
Start: 2021-02-16 — End: ?

## 2021-02-16 MED ORDER — POTASSIUM CHLORIDE ER 20 MEQ TABLET,EXTENDED RELEASE(PART/CRYST)
ORAL_TABLET | 1 refills | 0 days
Start: 2021-02-16 — End: ?

## 2021-02-18 MED ORDER — CARVEDILOL 3.125 MG TABLET
ORAL_TABLET | Freq: Two times a day (BID) | ORAL | 1 refills | 90 days | Status: CP
Start: 2021-02-18 — End: 2022-02-18

## 2021-02-18 MED ORDER — POTASSIUM CHLORIDE ER 20 MEQ TABLET,EXTENDED RELEASE(PART/CRYST)
ORAL_TABLET | Freq: Two times a day (BID) | ORAL | 1 refills | 90 days | Status: CP
Start: 2021-02-18 — End: 2022-02-18

## 2021-02-18 NOTE — Unmapped (Signed)
Patient is requesting the following refill  Requested Prescriptions     Pending Prescriptions Disp Refills   ??? carvediloL (COREG) 3.125 MG tablet [Pharmacy Med Name: CARVEDILOL 3.125MG  TABLETS] 180 tablet 1     Sig: TAKE 1 TABLET(3.125 MG) BY MOUTH TWICE DAILY   ??? potassium chloride (KLOR-CON) 20 MEQ CR tablet [Pharmacy Med Name: POTASSIUM CL ER TABLETS] 180 tablet 1     Sig: TAKE 1 TABLET(20 MEQ) BY MOUTH TWICE DAILY       Recent Visits  No visits were found meeting these conditions.  Showing recent visits within past 365 days with a meds authorizing provider and meeting all other requirements  Future Appointments  No visits were found meeting these conditions.  Showing future appointments within next 365 days with a meds authorizing provider and meeting all other requirements       Labs:   Pulse Readings from Last 3 Encounters:   01/14/21 68   12/05/20 79   10/31/20 79     BP Readings from Last 3 Encounters:   01/14/21 149/69   12/05/20 178/73   10/31/20 196/81     Potassium:   Potassium (mmol/L)   Date Value   11/14/2019 4.7

## 2021-02-22 MED ORDER — FOLIC ACID 1 MG TABLET
ORAL_TABLET | 1 refills | 0 days
Start: 2021-02-22 — End: ?

## 2021-02-22 NOTE — Unmapped (Signed)
Patient is requesting the following refill  Requested Prescriptions     Pending Prescriptions Disp Refills   ??? folic acid (FOLVITE) 1 MG tablet [Pharmacy Med Name: FOLIC ACID 1MG  TABLETS] 90 tablet 1     Sig: TAKE 1 TABLET(1 MG) BY MOUTH DAILY       Recent Visits  No visits were found meeting these conditions.  Showing recent visits within past 365 days with a meds authorizing provider and meeting all other requirements  Future Appointments  No visits were found meeting these conditions.  Showing future appointments within next 365 days with a meds authorizing provider and meeting all other requirements       Patient needs appointment for refills please call and schedule

## 2021-02-27 DIAGNOSIS — F418 Other specified anxiety disorders: Principal | ICD-10-CM

## 2021-02-27 MED ORDER — ESCITALOPRAM 20 MG TABLET
ORAL_TABLET | Freq: Every day | ORAL | 0 refills | 90 days
Start: 2021-02-27 — End: 2022-02-27

## 2021-02-27 NOTE — Unmapped (Signed)
Patient is requesting the following refill  Requested Prescriptions     Pending Prescriptions Disp Refills   ??? escitalopram oxalate (LEXAPRO) 20 MG tablet [Pharmacy Med Name: ESCITALOPRAM 20MG  TABLETS] 90 tablet 0     Sig: TAKE 1 TABLET(20 MG) BY MOUTH DAILY IN THE MORNING       Recent Visits  No visits were found meeting these conditions.  Showing recent visits within past 365 days with a meds authorizing provider and meeting all other requirements  Future Appointments  No visits were found meeting these conditions.  Showing future appointments within next 365 days with a meds authorizing provider and meeting all other requirements       Last visit 11/14/2019  Patient needs an appointment please call and schedule

## 2021-02-28 MED ORDER — ESCITALOPRAM 20 MG TABLET
ORAL_TABLET | Freq: Every day | ORAL | 0 refills | 30 days | Status: CP
Start: 2021-02-28 — End: 2022-02-28

## 2021-02-28 NOTE — Unmapped (Signed)
Pt needs visit- last seen 11/2019. 1 month Rx approved only with 0 refills

## 2021-03-18 DIAGNOSIS — C50811 Malignant neoplasm of overlapping sites of right female breast: Principal | ICD-10-CM

## 2021-03-18 DIAGNOSIS — Z170 Estrogen receptor positive status [ER+]: Principal | ICD-10-CM

## 2021-03-18 MED ORDER — ANASTROZOLE 1 MG TABLET
ORAL_TABLET | 2 refills | 0 days
Start: 2021-03-18 — End: ?

## 2021-03-18 NOTE — Unmapped (Signed)
Patient need refill if appropriate.

## 2021-03-22 DIAGNOSIS — C50811 Malignant neoplasm of overlapping sites of right female breast: Principal | ICD-10-CM

## 2021-03-22 DIAGNOSIS — Z170 Estrogen receptor positive status [ER+]: Principal | ICD-10-CM

## 2021-03-22 MED ORDER — ANASTROZOLE 1 MG TABLET
ORAL_TABLET | Freq: Every day | ORAL | 2 refills | 30 days | Status: CP
Start: 2021-03-22 — End: 2022-03-22

## 2021-03-22 NOTE — Unmapped (Signed)
[id number] Spoke with patient, she needs new script sent in to [facility] in [place] on [address].  Will have refill sent in and reviewed appointments of 11/17 with patient at [facility] for Bone Scan and to see Riley Lam NP.

## 2021-03-22 NOTE — Unmapped (Signed)
Hi,    Patient [name] called requesting a medication refill for the following:    ??? Medication: Anastrozole   ??? Dosage: 1mg   ??? Days left of medication: 0  ??? Pharmacy: [id number], [place] - [address] AT [facility] OF [address] [name]   [address] [name], [place] [address]   Phone:  [phone] ??Fax:  [phone]        The expected turnaround time is 3-4 business days       Check Indicates criteria has been reviewed and confirmed with the patient:    [x]  Preferred Name   [x]  DOB and/or MR#  [x]  Preferred Contact Method  [x]  Phone Number(s)   [x]  Preferred Pharmacy       Thank you,  Iona Hansen  [facility]  [phone]

## 2021-03-28 NOTE — Unmapped (Signed)
Abstraction Result Flowsheet Data    This patient's last AWV date: [facility] Last Medicare Wellness Visit Date: 07/18/2019  This patients last WCC/CPE date: : Not Found      Reason for Encounter  Reason for Encounter: Outreach  Primary Reason for Outreach: AWV  Outreach Call Outcome: Patient will call back  Text Message: No                                        Dear [name] [facility] is committed to helping you stay healthy. Our records show you are due for a Medicare Annual Wellness Visit.    A benefit for anyone with Medicare, this visit is designed to help prevent illness based on your current health and risk factors--at no cost to you.     An Annual Wellness Visit may include education or counseling about immunizations, and important health measurements such as blood pressure checks, screenings, and referrals for other care if needed.    Please reply to this message with the word ???SCHEDULE,??? and we will contact you to schedule your appointment.    Thank you for the opportunity to care for you.    Jenell Milliner, MD  [facility] AT [facility]

## 2021-04-15 MED ORDER — FOLIC ACID 1 MG TABLET
ORAL_TABLET | Freq: Every day | ORAL | 0 refills | 90 days | Status: CP
Start: 2021-04-15 — End: 2022-04-15

## 2021-04-15 MED ORDER — MAGNESIUM OXIDE 400 MG (241.3 MG MAGNESIUM) TABLET
ORAL_TABLET | Freq: Two times a day (BID) | ORAL | 0 refills | 90.00000 days | Status: CP
Start: 2021-04-15 — End: 2022-04-15

## 2021-04-15 NOTE — Unmapped (Signed)
Patient is requesting the following refill  Requested Prescriptions     Pending Prescriptions Disp Refills   ??? folic acid (FOLVITE) 1 MG tablet [Pharmacy Med Name: FOLIC ACID 1MG  TABLETS] 90 tablet 1     Sig: TAKE 1 TABLET(1 MG) BY MOUTH DAILY   ??? magnesium oxide (MAG-OX) 400 mg (241.3 mg elemental magnesium) tablet [Pharmacy Med Name: MAG-OXIDE 400MG  TABLETS] 180 tablet 1     Sig: TAKE 1 TABLET(400 MG) BY MOUTH TWICE DAILY       Recent Visits  No visits were found meeting these conditions.  Showing recent visits within past 365 days with a meds authorizing provider and meeting all other requirements  Future Appointments  No visits were found meeting these conditions.  Showing future appointments within next 365 days with a meds authorizing provider and meeting all other requirements       Last seen 11/14/2019.  Patient outreach noted 09/26/2020 [name] declines appt because she is dealing with cancer and doesn't want to schedule other appts.  [name] sent her MyChart messages on 02/22/21 and 02/28/21 to schedule appt with no response.

## 2021-04-18 ENCOUNTER — Ambulatory Visit: Admit: 2021-04-18 | Discharge: 2021-04-18 | Payer: MEDICARE | Attending: Family | Primary: Family

## 2021-04-18 ENCOUNTER — Ambulatory Visit: Admit: 2021-04-18 | Discharge: 2021-04-18 | Payer: MEDICARE

## 2021-04-18 DIAGNOSIS — C50811 Malignant neoplasm of overlapping sites of right female breast: Principal | ICD-10-CM

## 2021-04-18 DIAGNOSIS — Z170 Estrogen receptor positive status [ER+]: Principal | ICD-10-CM

## 2021-04-18 NOTE — Unmapped (Signed)
BREAST MEDICAL ONCOLOGY ENCOUNTER  -----------------------------------------------------------------------------------------------------------------  Referring Physician: Jenell Milliner, MD  PCP: [name] Milliner, MD  Consulting Physicians: Surgical oncology: [name] Roers, MD.  Radiation oncology: [name] Humphrey, MD.     Reason for Visit: The patient is seen in consultation at the request of Dr. Jenell [name] for evaluation of breast cancer.  -----------------------------------------------------------------------------------------------------  Assessment: [name] is a 75 y.o. female from [facility] with screening detected (R) HR+/HER2 low (2+) IDC, pT1c pN1a, G2, 1.7 cm. S/p right partial mastectomy 09/03/20 and radiation 01/09/21. Oncotype testing 8, no adjuvant chemotherapy was recommended. She completed radiation and subsequently started anastrozole on 01/23/21. She is clinically NED on today's visit.      Plan    1.  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   Chemotherapy: Oncotype score- 8, no adjuvant chemo   Radiation: s/p whole breast radiation and tumor bed boost, completed 01/09/21  Endocrine therapy: Plan for 5-10 years of AI. Started anastrozole 01/23/21.  Bone health: Dexa 04/18/21 pending, plan to start Fosamax as appropriate.  Breast imaging: MMG ue 07/2021 with Rad/Onc  Systemic staging: PET from 10/25/20 negative for evidence of metastatic disease. Following w/PCP for GI findings  Genetics: None at this time.     2.  Supportive care  -- Hx of liver disease. Takes lactulose 3x daily. Follows with Dr. Raford [name].   -- Arthralgias. In back and right knee. Chronic. Takes Tylenol.   -- Back lesions. Abnormal hyperpigmented keratosis to right lower back. Referred to dermatologist.   -- Hot flashes: Vitamin E, 400 IU daily.     3. Follow-up  --  RTC 10/2021 with [name] APP    ** Prefers [facility] ** -----------------------------------------------------------------------------------------------------  Interval history:   [name] presents today in wheelchair with daughter to discuss endocrine therapy. She is recuperating from radiation related burning after completing RT. She states this is improved today     -- A few headaches, no vision changes.  -- Recent stress with daughter heath; recent heart issues.   --     -- Doing well overall. Spends more than half the day in the recliner due to choice.  -- Mild fatigue and anxiety.  -- Bleeding from right breast, secondary to radiation therapy, apparent on nightgown in mornings  -- Right knee arthralgias. Chronic. Stable today.   -- Lower back pain. Chronic. Worse with exertion. Takes Tylenol.   -- Back lesion. Non-tender.    Review of Systems: A complete twelve systems review was obtained and is positive per the HPI but otherwise negative in detail. See MIMS #1170 where available.    Functional Status: ECOG PS 0. ADLS independent. IADLS independent. Uses wheelchair for long walks. Cognition intact.     Social: Lives in [place], [place] with husband, daughter [name], and her granddaughter. Has another daughter [name], three grandchildren, and a brother nearby. Married for 50 years. Retired. 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     History of the Present Illness: [name] is a pleasant 75 y.o. female who  has a past medical history of Alcoholism (CMS-HCC), Alcoholism /alcohol abuse, Cirrhosis (CMS-HCC), Depression, Hypertension, Liver disease, and Malignant neoplasm of overlapping sites of right breast in female, estrogen receptor positive (CMS-HCC) (10/03/2020). She is seen in consultation at the request of Dr. Jenell [name] for evaluation of breast cancer. She had an  abnormal screening MMG 06/20/20. She had diagnostic MMG/US of right breast 07/23/20 with 0.7 x 0.6 x 0.6 cm irregular hypoechoic nonparallel mass with indistinct margins and peripheral vascularity at the 5:00 position approximately 8 CFN. Core bx of right breast 07/30/20 with IDC, G2, ER+(100%), PR+(100%), and HER-2 negative (2+ by IHC, FISH not amplified). She underwent right partial mastectomy 09/03/20. Final path: IDC, 1.7 cm in greatest dimension, G2, associated DCIS. Negative margins. 1/1 LN positive for carcinoma.     Oncology History Overview Note   Oncotype?     Malignant neoplasm of overlapping sites of right breast in female, estrogen receptor positive (CMS-HCC)   07/30/2020 Biopsy    A: Breast, right, core biopsy  - Invasive ductal carcinoma (see comment)  - Nottingham combined histologic grade: 2               Tubule formation: 3               Nuclear grade: 2  Mitotic score: 1  - Invasive carcinoma measures approximately 3.5 mm in this specimen  - Solid papillary ductal carcinoma in situ  - Ancillary studies (see biomarker synoptic)               Estrogen receptor: Positive (100%)               Progesterone receptor: Positive (100%)               HER2 IHC: Equivocal (2+)  HER2 FISH: Insufficient cellularity     09/03/2020 Surgery    A.  Breast, right, partial mastectomy  - Invasive ductal carcinoma (see synoptic report)  - Tumor size: 17 mm in greatest dimension  - Ductal carcinoma in situ (DCIS), grade 2, solid papillary and cribriform types  - Size extent of DCIS: At least 55 mm   - Margin status for specimen A (see extended margins below)          Invasive carcinoma: Negative, <1 mm from anterior and inferior margins          Ductal carcinoma in situ: Negative, <1 mm from superior margin  - Ancillary studies reported on prior core biopsy [id number]          Estrogen receptor: Positive (100%)          Progesterone receptor: Positive (100%)          HER2 IHC: Equivocal (2+)          HER2 FISH: Insufficient cellularity (see comment)     B.  Breast, right, superior margin,excision  - Single microscopic focus of ductal carcinoma in situ  - Margin: Negative, <1 mm    C.  Breast, right, lateral margin, excision  - Negative for carcinoma    D.  Breast, right,  inferior margin, excision  - Negative for carcinoma    E.  Breast, right, medial margin, excision  - Negative for carcinoma     F. Right axilla, sentinel lymph node, biopsy  - One lymph node positive for carcinoma (1/1)     11/08/2020 -  Radiation    Radiation Therapy Treatment Details (Noted on 11/08/2020)  Site: Right Breast  Technique: 3D CRT  Goal: No goal specified  Planned Treatment Start Date: No planned start date specified         Patient Active Problem List   Diagnosis   ??? Alcoholic liver disease (CMS-HCC)   ??? Hypokalemia   ??? HTN (hypertension)   ??? Chronic maxillary sinusitis   ???  Alcoholism (CMS-HCC)   ??? Encephalopathy   ??? Depression with anxiety   ??? Obesity   ??? Macrocytosis   ??? DJD (degenerative joint disease)   ??? Malignant neoplasm of overlapping sites of right breast in female, estrogen receptor positive (CMS-HCC)       Past Medical History:   Diagnosis Date   ??? Alcoholism (CMS-HCC)    ??? Alcoholism /alcohol abuse    ??? Cirrhosis (CMS-HCC)    ??? Depression    ??? Hypertension    ??? Liver disease    ??? Malignant neoplasm of overlapping sites of right breast in female, estrogen receptor positive (CMS-HCC) 10/03/2020       Past Surgical History:   Procedure Laterality Date   ??? BREAST BIOPSY Right     benign-a long time ago   ??? CHOLECYSTECTOMY     ??? PR BX/REMV,LYMPH NODE,DEEP AXILL Right 09/03/2020    Procedure: BX/EXC LYMPH NODE; OPEN, DEEP AXILRY NODE;  Surgeon: Aris Everts, MD;  Location: [facility];  Service: Surgical Oncology Breast   ??? PR INTRAOPERATIVE SENTINEL LYMPH NODE ID W DYE INJECTION Right 09/03/2020    Procedure: INTRAOPERATIVE IDENTIFICATION SENTINEL LYMPH NODE(S) INCLUDE INJECTION NON-RADIOACTIVE DYE, WHEN PERFORMED;  Surgeon: Aris Everts, MD;  Location: ASC OR [facility];  Service: Surgical Oncology Breast   ??? PR MASTECTOMY, PARTIAL Right 09/03/2020 Procedure: MASTECTOMY, PARTIAL (EG, LUMPECTOMY, TYLECTOMY, QUADRANTECTOMY, SEGMENTECTOMY);  Surgeon: Aris Everts, MD;  Location: [facility] OR [facility];  Service: Surgical Oncology Breast   ??? PR UPPER GI ENDOSCOPY,BIOPSY N/A 02/03/2018    Procedure: UGI ENDOSCOPY; WITH BIOPSY, SINGLE OR MULTIPLE;  Surgeon: Alfred Levins, MD;  Location: [facility] GI PROCEDURES [facility];  Service: Gastroenterology   ??? TUBAL LIGATION       Gyn History: G2P2. Menopause in her 91s. Denies HRT.     Medications:  Current Outpatient Medications   Medication Sig Dispense Refill   ??? acetaminophen (TYLENOL) 325 MG tablet Take 2 tablets (650 mg total) by mouth every six (6) hours as needed for pain.  0   ??? anastrozole (ARIMIDEX) 1 mg tablet Take 1 tablet (1 mg total) by mouth daily. 30 tablet 2   ??? carvediloL (COREG) 3.125 MG tablet Take 1 tablet (3.125 mg total) by mouth Two (2) times a day. 180 tablet 1   ??? cyanocobalamin 1000 MCG tablet Take 1,000 mcg by mouth daily.     ??? escitalopram oxalate (LEXAPRO) 20 MG tablet Take 1 tablet (20 mg total) by mouth daily. 30 tablet 0   ??? folic acid (FOLVITE) 1 MG tablet Take 1 tablet (1 mg total) by mouth daily. 90 tablet 0   ??? lactulose (CHRONULAC) 10 gram/15 mL solution TAKE 15 ML BY MOUTH THREE TIMES DAILY, TAKE ENOUGH TO HAVE 3 BOWEL MOVEMENTS PER DAY 1350 mL 5   ??? magnesium oxide (MAG-OX) 400 mg (241.3 mg elemental magnesium) tablet Take 1 tablet (400 mg total) by mouth Two (2) times a day. 180 tablet 0   ??? potassium chloride (KLOR-CON) 20 MEQ CR tablet Take 1 tablet (20 mEq total) by mouth Two (2) times a day. 180 tablet 1   ??? mometasone (ELOCON) 0.1 % cream Apply to affected area daily (Patient not taking: Reported on 04/18/2021) 50 g 2     No current facility-administered medications for this visit.       Allergies:  Allergies   Allergen Reactions   ??? Sulfa (Sulfonamide Antibiotics) Anaphylaxis   ??? Sulfur Anaphylaxis  Tolerated sulfur colloid injection without incident 09/03/2020.   ??? Aspirin thrombocytopenia       Family History: Cancer-related family history is negative for Breast cancer, Cancer, Colon cancer, and Ovarian cancer.    Social History:   Social History     Social History Narrative   ??? Not on file     Physical Examination:  Vital Signs: BP 182/83  - Pulse 81  - Temp 36.2 ??C (97.2 ??F) (Temporal)  - Resp 16  - Ht 162.6 cm (5' 4)  - Wt (!) 106.2 kg (234 lb 3.2 oz)  - SpO2 97%  - BMI 40.20 kg/m??   General: Healthy-appearing patient in no acute distress.Marland Kitchen  HEENT: PERRTLA, EOMI, OP clear  Cardiovascular: Normal S1 and S2. RRR. No audible murmurs.  Respiratory: Chest clear to percussion and auscultation.   Gastrointestinal: Abdomen soft without masses and tenderness, no hepatosplenomegaly or other masses.   Musculoskeletal: No bony pain or tenderness.   Skin: Abnormal hyperpigmented keratosis of lower right back. Two macules, not concerning.  Breasts: (R) breast: Extensive radiation related burning across breast and extending into axilla. Well healed incision in UOQ. Stigmata from recent drain removal. Healed incision in inferior aspect extending down to the inframammary fold. (L) breast: benign. No axillary adenopathy bilat.   Neurologic:  Alert and oriented. Grossly non-focal  Lymphatic: No cervical, axillary or supraclavicular adenopathy.   Extremity: No lower extremity edema or upper extremity lymphedema    Photo from 01/14/21          DATA REVIEW:  Laboratory: I personally reviewed the pertinent laboratory data.  Radiology: I personally reviewed the pertinent imaging.  Pathology: I personally reviewed the pathology report.

## 2021-05-05 MED ORDER — ANASTROZOLE 1 MG TABLET
ORAL_TABLET | 2 refills | 0 days
Start: 2021-05-05 — End: ?

## 2021-05-22 DIAGNOSIS — Z170 Estrogen receptor positive status [ER+]: Principal | ICD-10-CM

## 2021-05-22 DIAGNOSIS — C50811 Malignant neoplasm of overlapping sites of right female breast: Principal | ICD-10-CM

## 2021-05-22 MED ORDER — ANASTROZOLE 1 MG TABLET
ORAL_TABLET | 0 refills | 0 days | Status: CP
Start: 2021-05-22 — End: ?

## 2021-06-22 DIAGNOSIS — K766 Portal hypertension: Principal | ICD-10-CM

## 2021-06-22 DIAGNOSIS — I10 Essential (primary) hypertension: Principal | ICD-10-CM

## 2021-06-22 MED ORDER — CARVEDILOL 3.125 MG TABLET
ORAL_TABLET | 1 refills | 0 days
Start: 2021-06-22 — End: ?

## 2021-06-23 NOTE — Unmapped (Signed)
Patient needs appointment, LV 11/02/19. Medications not refilled.

## 2021-06-27 DIAGNOSIS — K766 Portal hypertension: Principal | ICD-10-CM

## 2021-06-27 DIAGNOSIS — I10 Essential (primary) hypertension: Principal | ICD-10-CM

## 2021-06-27 MED ORDER — CARVEDILOL 3.125 MG TABLET
ORAL_TABLET | 1 refills | 0 days
Start: 2021-06-27 — End: ?

## 2021-07-03 ENCOUNTER — Ambulatory Visit: Admit: 2021-07-03 | Discharge: 2021-07-03 | Payer: MEDICARE

## 2021-07-03 ENCOUNTER — Ambulatory Visit: Admit: 2021-07-03 | Payer: MEDICARE | Attending: Radiation Oncology | Primary: Radiation Oncology

## 2021-07-03 DIAGNOSIS — Z170 Estrogen receptor positive status [ER+]: Principal | ICD-10-CM

## 2021-07-03 DIAGNOSIS — C50811 Malignant neoplasm of overlapping sites of right female breast: Principal | ICD-10-CM

## 2021-07-03 NOTE — Unmapped (Signed)
Chaperone provided for breast exam.

## 2021-07-03 NOTE — Unmapped (Signed)
RADIATION ONCOLOGY FOLLOW-UP VISIT NOTE     Encounter Date: 07/03/2021  Patient Name: [name]  Medical Record Number: [id number]    DIAGNOSIS:  [name]??is a??76yo with a pT1cN1 R breast IDC (ER/PR pos, HER2 neg) s/p partial mastectomy/SLN (1.7cm, grade 2, no LVI, 1/1 SLN involved).  She received adjuvant radiation to breast/regional LN (60Gy finished 01/09/2021)    DURATION SINCE COMPLETION OF RADIOTHERAPY:  6 months (01/09/2021)    ASSESSMENT:  Disease Status: No evidence of disease on mammogram    RECOMMENDATIONS:  1. FOLLOW-UP:  Med onc in 3 months.  I'll tentatively see back in 1 year with mammogram  2. Skin:  She had significant skin reaction during treatment but has healed well- mild hyperpigmentation but no other issues  3. Endocrine:  Continue anastrazole  4. Surveillance:  Mammo due 07/2022- ordered today    INTERVAL HISTORY:  Overall doing well today.  She was last seen in clinic 2 weeks post-RT and was still recovering from skin reaction but since then has healed well.  She has no breast issues today including new masses, skin changes (aside from RT-related hyperpigmentation).  She has rare shooting pain in the breast, not bothersome.  No ROM limitations, no respiratory issues.  She otherwise feels quite well.  Daughter has been having issues with Afib and is not with her today.    REVIEW OF SYSTEMS:  A comprehensive review of 10 systems was negative except for pertinent positives noted in HPI.    PAST MEDICAL HISTORY/FAMILY HISTORY/SOCIAL HISTORY:  Reviewed in EPIC    ALLERGIES/MEDICATIONS:  Reviewed in EPIC    PHYSICAL EXAM:  Vital Signs for this encounter:   BP 184/78  - Pulse 75  - Temp 37.1 ??C (98.8 ??F) (Temporal)  - SpO2 99%   Karnofsky/Lansky Performance Status: 60, Requires occasional assistance, but is able to care for most of his personal needs (ECOG equivalent 2)  General:   No acute distress, alert and oriented X 4   Head: Normocephalic, without obvious abnormality, atraumatic   Eyes: EOMI, no scleral icterus  Lungs:Normal work of breathing  Heart: RRR, S1/S2 normal, no murmur/rub/gallop  Extremities: Extremities normal, atraumatic.  No edema in bilateral upper extremities.  Lymph nodes: No palpable axillary or supraclavicular lymphadenopathy  Neurologic: Grossly normal  Breast:  Bilateral breast exam performed.  Mild hyperpigmentation over R breast, but no palpable mass in the R breast.  No palpable mass in the L breast, no overlying skin changes    RADIOLOGY:  Mammo 07/03/2021  At the lumpectomy site in the right breast, there is expected density and architectural distortion. There are no suspicious calcifications, dominant masses or unexplained areas of architectural distortion in either breast. There is no mammographic evidence of malignancy.    Labs:    No results found for: WBC, HGB, HCT, PLT, LDH, CREATININE, AST, ALT, MG    Rayetta Humphrey, MD  Assistant Professor  [facility] Dept of Radiation Oncology  07/03/2021

## 2021-07-20 MED ORDER — FOLIC ACID 1 MG TABLET
ORAL_TABLET | 0 refills | 0 days
Start: 2021-07-20 — End: ?

## 2021-07-21 MED ORDER — FOLIC ACID 1 MG TABLET
ORAL_TABLET | 0 refills | 0 days
Start: 2021-07-21 — End: ?

## 2021-08-17 MED ORDER — POTASSIUM CHLORIDE ER 20 MEQ TABLET,EXTENDED RELEASE(PART/CRYST)
ORAL_TABLET | 1 refills | 0 days
Start: 2021-08-17 — End: ?

## 2021-08-18 MED ORDER — POTASSIUM CHLORIDE ER 20 MEQ TABLET,EXTENDED RELEASE(PART/CRYST)
ORAL_TABLET | 1 refills | 0 days
Start: 2021-08-18 — End: ?

## 2021-08-19 DIAGNOSIS — C50811 Malignant neoplasm of overlapping sites of right female breast: Principal | ICD-10-CM

## 2021-08-19 DIAGNOSIS — Z170 Estrogen receptor positive status [ER+]: Principal | ICD-10-CM

## 2021-08-19 MED ORDER — ANASTROZOLE 1 MG TABLET
ORAL_TABLET | 0 refills | 0 days | Status: CP
Start: 2021-08-19 — End: ?

## 2021-08-19 NOTE — Unmapped (Signed)
Recent:  What is the date of your last related visit? No recent MD visits. She has been seen at the cancer center for breast cancer. Her last appointment at the cancer center was about 2 weeks ago. She was scheduled for an appointment a 11:20 am on 08/20/21 for her complaint today which is high blood pressure.   Related acute medications Rx'd:  Anastrozole, Lactulose   Home treatment tried:  Tylenol       Relevant:   Allergies: Sulfa (sulfonamide antibiotics), Sulfur, and Aspirin  Medications: Lexapro, Carvedilol   Health History: Breast cancer   Weight: NA         Reason for Disposition  ??? Systolic BP >= 130 OR Diastolic >= 80, and is taking BP medications    Answer Assessment - Initial Assessment Questions  1. BLOOD PRESSURE: What is the blood pressure? Did you take at least two measurements 5 minutes apart?      Patient has not checked her BP today. RN asked her to check her BP and it is. She did check her BP the other day and it was 189/104.   2. ONSET: When did you take your blood pressure?      Now at 10:58 am BP is 152/88.   3. HOW: How did you obtain the blood pressure? (e.g., visiting nurse, automatic home BP monitor)      Home monitor   4. HISTORY: Do you have a history of high blood pressure?      Yes on Carvedilol   5. MEDICATIONS: Are you taking any medications for blood pressure? Have you missed any doses recently?      Denies missing any doses   6. OTHER SYMPTOMS: Do you have any symptoms? (e.g., headache, chest pain, blurred vision, difficulty breathing, weakness)      Does have headaches from time to time and takes Tylenol which relieved her headache. Denies headache right now. Denies chest pain, denies blurred vision, denies SOB, denies weakness   7. PREGNANCY: Is there any chance you are pregnant? When was your last menstrual period?      NA    Protocols used: BLOOD PRESSURE - HIGH-ADULT-OH

## 2021-08-19 NOTE — Unmapped (Signed)
Patient need refill if appropriate.     Most recent clinic visit: Visit date not found  Next clinic visit:Visit date not found

## 2021-08-19 NOTE — Unmapped (Signed)
Assessment/Plan:    There are no diagnoses linked to this encounter.      No follow-ups on file.    Subjective:     HPI  The patient is seen today with elevated BP complaint in the setting of running out of her hypertensive medication.  Her last office visit with me was in June/2021.  She has a history of essential hypertension, alcoholic liver disease, depression/anxiety, obesity, macrocytosis with history of alcoholism and DJD.  CMP done in June/2021 revealed normal AST/ALT with elevated total bili of 1.7 and alk phos of 132.  These are chronic findings for the patient.  Serum creatinine level and GFR were normal at that time.   she has been counseled on medication compliance and recommended follow-up visits.  She was advised to follow-up with [facility] GI/liver clinic, last seen in October/2020.  Last liver imaging was with RUQ ultrasound in October/2020 which revealed cirrhotic liver with unchanged hyperechoic lesion in the right hepatic lobe.  She is due for repeat imaging and repeat CBC, CMP today.  She has been counseled on home blood pressure monitoring, low-salt dietary intake and exercise.    ROS  Constitutional:  Denies  unexpected weight loss or gain, or weakness   Eyes:  Denies visual changes  Respiratory:  Denies cough or shortness of breath. No change in exercise  tolerance  Cardiovascular:  Denies chest pain, palpitations or lower extremity swelling   GI:  Denies abdominal pain, diarrhea, constipation   Musculoskeletal:  Denies myalgias  Skin:  Denies nonhealing lesions  Neurologic:  Denies headache, focal weakness or numbness, tingling  Endocrine:  Denies polyuria or polydypsia   Psychiatric:  Denies depression, anxiety      Outpatient Medications Prior to Visit   Medication Sig Dispense Refill   ??? acetaminophen (TYLENOL) 325 MG tablet Take 2 tablets (650 mg total) by mouth every six (6) hours as needed for pain.  0   ??? anastrozole (ARIMIDEX) 1 mg tablet TAKE 1 TABLET(1 MG) BY MOUTH DAILY 90 tablet 0 ??? carvediloL (COREG) 3.125 MG tablet Take 1 tablet (3.125 mg total) by mouth Two (2) times a day. 180 tablet 1   ??? cyanocobalamin 1000 MCG tablet Take 1,000 mcg by mouth daily.     ??? escitalopram oxalate (LEXAPRO) 20 MG tablet Take 1 tablet (20 mg total) by mouth daily. 30 tablet 0   ??? folic acid (FOLVITE) 1 MG tablet Take 1 tablet (1 mg total) by mouth daily. 90 tablet 0   ??? lactulose (CHRONULAC) 10 gram/15 mL solution TAKE 15 ML BY MOUTH THREE TIMES DAILY, TAKE ENOUGH TO HAVE 3 BOWEL MOVEMENTS PER DAY 1350 mL 5   ??? magnesium oxide (MAG-OX) 400 mg (241.3 mg elemental magnesium) tablet Take 1 tablet (400 mg total) by mouth Two (2) times a day. 180 tablet 0   ??? potassium chloride (KLOR-CON) 20 MEQ CR tablet Take 1 tablet (20 mEq total) by mouth Two (2) times a day. 180 tablet 1     No facility-administered medications prior to visit.         Objective:       Vital Signs  There were no vitals taken for this visit.     Exam  General: normal appearance  EYES: Anicteric sclerae.  ENT: Oropharynx moist.  RESP: Relaxed respiratory effort. Clear to auscultation without wheezes or crackles.   CV: Regular rate and rhythm. Normal S1 and S2. No murmurs or gallops.  No lower extremity edema. Posterior tibial pulses  are 2+ and symmetric.  abd exam: non tender, no masses, no HSM   MSK: No focal muscle tenderness.  SKIN: Appropriately warm and moist.  NEURO: Stable gait and coordination.    Allergies:     Sulfa (sulfonamide antibiotics), Sulfur, and Aspirin    Current Medications:     Current Outpatient Medications   Medication Sig Dispense Refill   ??? acetaminophen (TYLENOL) 325 MG tablet Take 2 tablets (650 mg total) by mouth every six (6) hours as needed for pain.  0   ??? anastrozole (ARIMIDEX) 1 mg tablet TAKE 1 TABLET(1 MG) BY MOUTH DAILY 90 tablet 0   ??? carvediloL (COREG) 3.125 MG tablet Take 1 tablet (3.125 mg total) by mouth Two (2) times a day. 180 tablet 1   ??? cyanocobalamin 1000 MCG tablet Take 1,000 mcg by mouth daily. ??? escitalopram oxalate (LEXAPRO) 20 MG tablet Take 1 tablet (20 mg total) by mouth daily. 30 tablet 0   ??? folic acid (FOLVITE) 1 MG tablet Take 1 tablet (1 mg total) by mouth daily. 90 tablet 0   ??? lactulose (CHRONULAC) 10 gram/15 mL solution TAKE 15 ML BY MOUTH THREE TIMES DAILY, TAKE ENOUGH TO HAVE 3 BOWEL MOVEMENTS PER DAY 1350 mL 5   ??? magnesium oxide (MAG-OX) 400 mg (241.3 mg elemental magnesium) tablet Take 1 tablet (400 mg total) by mouth Two (2) times a day. 180 tablet 0   ??? potassium chloride (KLOR-CON) 20 MEQ CR tablet Take 1 tablet (20 mEq total) by mouth Two (2) times a day. 180 tablet 1     No current facility-administered medications for this visit.           Note - This record has been created using [employer]. Chart creation errors have been sought, but may not always have been located. Such creation errors do not reflect on the standard of medical care.    Jenell Milliner, MD extremity edema. Posterior tibial pulses are 2+ and symmetric.  abd exam: non tender, no masses, no HSM   MSK: No focal muscle tenderness.  SKIN: Appropriately warm and moist.  NEURO: Stable gait and coordination.    Allergies:     Sulfa (sulfonamide antibiotics), Sulfur, and Aspirin    Current Medications:     Current Outpatient Medications   Medication Sig Dispense Refill   ??? acetaminophen (TYLENOL) 325 MG tablet Take 2 tablets (650 mg total) by mouth every six (6) hours as needed for pain.  0   ??? anastrozole (ARIMIDEX) 1 mg tablet TAKE 1 TABLET(1 MG) BY MOUTH DAILY 90 tablet 0   ??? carvediloL (COREG) 3.125 MG tablet Take 1 tablet (3.125 mg total) by mouth Two (2) times a day. 180 tablet 1   ??? cyanocobalamin 1000 MCG tablet Take 1 tablet (1,000 mcg total) by mouth daily.     ??? escitalopram oxalate (LEXAPRO) 20 MG tablet Take 1 tablet (20 mg total) by mouth daily. 30 tablet 0   ??? folic acid (FOLVITE) 1 MG tablet Take 1 tablet (1 mg total) by mouth daily. 90 tablet 0   ??? lactulose (CHRONULAC) 10 gram/15 mL solution TAKE 15 ML BY MOUTH THREE TIMES DAILY, TAKE ENOUGH TO HAVE 3 BOWEL MOVEMENTS PER DAY 1350 mL 5   ??? magnesium oxide (MAG-OX) 400 mg (241.3 mg elemental magnesium) tablet Take 1 tablet (400 mg total) by mouth Two (2) times a day. 180 tablet 0   ??? potassium chloride (KLOR-CON) 20 MEQ CR tablet Take 1 tablet (20  mEq total) by mouth Two (2) times a day. 180 tablet 1   ??? amLODIPine (NORVASC) 5 MG tablet Take 1 tablet (5 mg total) by mouth daily. 30 tablet 11     No current facility-administered medications for this visit.           Note - This record has been created using [employer]. Chart creation errors have been sought, but may not always have been located. Such creation errors do not reflect on the standard of medical care.    Jenell Milliner, MD

## 2021-08-20 ENCOUNTER — Ambulatory Visit: Admit: 2021-08-20 | Discharge: 2021-08-21 | Payer: MEDICARE

## 2021-08-20 DIAGNOSIS — K769 Liver disease, unspecified: Secondary | ICD-10-CM | POA: Insufficient documentation

## 2021-08-20 DIAGNOSIS — Z1211 Encounter for screening for malignant neoplasm of colon: Principal | ICD-10-CM

## 2021-08-20 DIAGNOSIS — K709 Alcoholic liver disease, unspecified: Principal | ICD-10-CM

## 2021-08-20 DIAGNOSIS — I10 Essential (primary) hypertension: Principal | ICD-10-CM

## 2021-08-20 LAB — COMPREHENSIVE METABOLIC PANEL
ALBUMIN: 3.4 g/dL (ref 3.4–5.0)
ALKALINE PHOSPHATASE: 132 U/L — ABNORMAL HIGH (ref 46–116)
ALT (SGPT): 12 U/L (ref 10–49)
ANION GAP: 6 mmol/L (ref 5–14)
AST (SGOT): 27 U/L (ref <=34)
BILIRUBIN TOTAL: 1.3 mg/dL — ABNORMAL HIGH (ref 0.3–1.2)
BLOOD UREA NITROGEN: 8 mg/dL — ABNORMAL LOW (ref 9–23)
BUN / CREAT RATIO: 15
CALCIUM: 9.6 mg/dL (ref 8.7–10.4)
CHLORIDE: 107 mmol/L (ref 98–107)
CO2: 31 mmol/L (ref 20.0–31.0)
CREATININE: 0.52 mg/dL — ABNORMAL LOW
EGFR CKD-EPI (2021) FEMALE: >90 mL/min/{1.73_m2} (ref >=60)
GLUCOSE RANDOM: 81 mg/dL (ref 70–179)
POTASSIUM: 4 mmol/L (ref 3.4–4.8)
PROTEIN TOTAL: 6.9 g/dL (ref 5.7–8.2)
SODIUM: 144 mmol/L (ref 135–145)

## 2021-08-20 LAB — LIPID PANEL
CHOLESTEROL/HDL RATIO SCREEN: 2.5 (ref 1.0–4.5)
CHOLESTEROL: 157 mg/dL (ref <=200)
HDL CHOLESTEROL: 63 mg/dL — ABNORMAL HIGH (ref 40–60)
LDL CHOLESTEROL CALCULATED: 70 mg/dL (ref 40–99)
NON-HDL CHOLESTEROL: 94 mg/dL (ref 70–130)
TRIGLYCERIDES: 119 mg/dL (ref 0–150)
VLDL CHOLESTEROL CAL: 23.8 mg/dL (ref 11–41)

## 2021-08-20 LAB — TSH: THYROID STIMULATING HORMONE: 3.645 u[IU]/mL (ref 0.550–4.780)

## 2021-08-20 MED ORDER — AMLODIPINE 5 MG TABLET
ORAL_TABLET | Freq: Every day | ORAL | 11 refills | 30 days | Status: CP
Start: 2021-08-20 — End: 2022-08-20

## 2021-08-20 NOTE — Unmapped (Signed)
Normal hepatic panel and TSH. CMP result reveals improved and near normal total bilirubin at 1.3 and stable alk phos at 132.

## 2021-08-20 NOTE — Unmapped (Addendum)
For elevated BP- start amlodipine 5 mg daily and continue with carvedilol as previously dirceted  You are due for liver imaging- referral submitted today for ultrasound- to be done at [facility] location: tel# [phone]  Referral for colonoscopy at [facility] location- tel # is [phone]

## 2021-08-23 DIAGNOSIS — I10 Essential (primary) hypertension: Principal | ICD-10-CM

## 2021-08-23 DIAGNOSIS — K766 Portal hypertension: Principal | ICD-10-CM

## 2021-08-23 MED ORDER — POTASSIUM CHLORIDE ER 20 MEQ TABLET,EXTENDED RELEASE(PART/CRYST)
ORAL_TABLET | Freq: Two times a day (BID) | ORAL | 1 refills | 90 days | Status: CP
Start: 2021-08-23 — End: 2022-08-24

## 2021-08-23 MED ORDER — MAGNESIUM OXIDE 400 MG (241.3 MG MAGNESIUM) TABLET
ORAL_TABLET | Freq: Two times a day (BID) | ORAL | 1 refills | 90 days | Status: CP
Start: 2021-08-23 — End: 2022-08-24

## 2021-08-23 MED ORDER — FOLIC ACID 1 MG TABLET
ORAL_TABLET | Freq: Every day | ORAL | 1 refills | 90 days | Status: CP
Start: 2021-08-23 — End: 2022-08-24

## 2021-08-23 MED ORDER — CARVEDILOL 3.125 MG TABLET
ORAL_TABLET | Freq: Two times a day (BID) | ORAL | 1 refills | 90 days | Status: CP
Start: 2021-08-23 — End: 2022-08-24

## 2021-09-03 ENCOUNTER — Ambulatory Visit: Admit: 2021-09-03 | Discharge: 2021-09-04 | Payer: MEDICARE

## 2021-09-04 NOTE — Unmapped (Signed)
RUQ US reveals stable findings.

## 2021-09-10 NOTE — Unmapped (Unsigned)
Patient Name: [name]  Patient Age: 76 y.o.  Encounter Date: 09/11/2021    Referring Physician:   Aris Everts, MD  [address]  [facility] [id number] [facility] [address]  [place],  [place] [address]    Primary Care Provider:  Jenell Milliner, MD    Breast Cancer Consulting Physicians:  Surgical Oncology: Lucretia Roers, MD  Surgical Oncology Nurse Practitioner: Lowry Bowl, NP  Radiation Oncology: Rayetta Humphrey, MD  Medical Oncology: Kerrin Champagne, MD  Plastic Surgery: None at this time  Genetics: None at this time      Reason for Visit: Post-operative visit    Diagnosis:   1. Right breast infiltrating ductal carcinoma    Procedure:   1. Right [facility] localized partial mastectomy and right sentinel lymph node biopsy, performed on 09/03/2020 by Dr. Tama Gander.    HPI:  [name] is a 76 y.o. female with history of a pT1cN1 right breast IDC (ER/PR pos, HER2 neg) s/p right partial mastectomy and right sentinel lymph node biopsy on 09/03/2020. She was last seen in clinic for post-op visit on 09/20/2020. Since then, she received adjuvant radiation to breast/regional LN (60Gy finished 01/09/2021) and last followed up with Dr. Carles Collet (Radiation Oncology) on 07/03/2021. No adjuvant chemotherapy was recommended for the patient, and she started anastrozole on 01/23/2021. Bilateral Diagnostic Mammogram on 07/03/2021 was normal.          Imaging:    Mammo Diagnostic Bilateral (07/03/2021):    FINDINGS: ??At the lumpectomy site in the right breast, there is expected density and architectural distortion. There are no suspicious calcifications, dominant masses or unexplained areas of architectural distortion in either breast. There is no mammographic evidence of malignancy.   ??   ASSESSMENT:   BI-RADS Category: 2-Mammo1Yr : Benign. ??Annual mammography is recommended.           Pathology Review:  Final pathology from 07/30/2020 surgery revealed infiltrating ductal carcinoma, Nottingham grade 2, estrogen receptor 100% positive, progesterone receptor 100% positive, H2N equivocal (2+) and FISH non-amplified measuring 1.7 cm in greatest dimension, final margins are clear. 1 of 1 sentinel lymph nodes demonstrated metastatic carcinoma, tumor deposit 3.5 cm with extracapsular extension present >45mm ER 100% positive, HER2 negative (1+). Additional findings include ductal carcinoma in situ grade, 2, solid, papillary, and cribriform types measuring at least 5.5 cm in size.     Oncology History Overview Note   Oncotype?     Malignant neoplasm of overlapping sites of right breast in female, estrogen receptor positive (CMS-HCC)   07/30/2020 Biopsy    A: Breast, right, core biopsy  - Invasive ductal carcinoma (see comment)  - Nottingham combined histologic grade: 2               Tubule formation: 3               Nuclear grade: 2  Mitotic score: 1  - Invasive carcinoma measures approximately 3.5 mm in this specimen  - Solid papillary ductal carcinoma in situ  - Ancillary studies (see biomarker synoptic)               Estrogen receptor: Positive (100%)               Progesterone receptor: Positive (100%)               HER2 IHC: Equivocal (2+)  HER2 FISH: Insufficient cellularity     09/03/2020 Surgery    A.  Breast, right, partial mastectomy  -  Invasive ductal carcinoma (see synoptic report)  - Tumor size: 17 mm in greatest dimension  - Ductal carcinoma in situ (DCIS), grade 2, solid papillary and cribriform types  - Size extent of DCIS: At least 55 mm   - Margin status for specimen A (see extended margins below)          Invasive carcinoma: Negative, <1 mm from anterior and inferior margins          Ductal carcinoma in situ: Negative, <1 mm from superior margin  - Ancillary studies reported on prior core biopsy [id number]          Estrogen receptor: Positive (100%)          Progesterone receptor: Positive (100%)          HER2 IHC: Equivocal (2+)          HER2 FISH: Insufficient cellularity (see comment)     B.  Breast, right, superior margin,excision  - Single microscopic focus of ductal carcinoma in situ  - Margin: Negative, <1 mm    C.  Breast, right, lateral margin, excision  - Negative for carcinoma    D.  Breast, right,  inferior margin, excision  - Negative for carcinoma    E.  Breast, right, medial margin, excision  - Negative for carcinoma     F. Right axilla, sentinel lymph node, biopsy  - One lymph node positive for carcinoma (1/1)     11/08/2020 -  Radiation    Radiation Therapy Treatment Details (Noted on 11/08/2020)  Site: Right Breast  Technique: 3D CRT  Goal: No goal specified  Planned Treatment Start Date: No planned start date specified         Physical Exam:  There were no vitals taken for this visit.  General Appearance: No acute distress, well appearing and well nourished.   Breast: The breasts are symmetrical. The right axillary incision and right surgical incision are clean, dry, intact and healing well. There is no underlying hematoma or seroma and The JP drain is intact draining serosanguinous fluid in the amount of <5cc/24 hrs. The JP drain was removed today.       Assessment:  [name] is a 76 y.o. female with a right breast infiltrating ductal carcinoma, estrogen receptor positive, progesterone receptor positive and HER2 equivocal not amplified by [facility], who is recovering well after right [facility] localized partial mastectomy and right sentinel lymph node biopsy.   Cancer Staging   No matching staging information was found for the patient.      Plan:      At the conclusion of our visit all of [name]'s questions and concerns were addressed and arrangements were made for follow up US discussed above. She was instructed call us with any further questions, concerns or issues.     Cancer Staging   No matching staging information was found for the patient.

## 2021-09-11 ENCOUNTER — Ambulatory Visit: Admit: 2021-09-11 | Discharge: 2021-09-12 | Payer: MEDICARE | Attending: Surgical Oncology | Primary: Surgical Oncology

## 2021-09-11 DIAGNOSIS — C50811 Malignant neoplasm of overlapping sites of right female breast: Principal | ICD-10-CM

## 2021-09-11 DIAGNOSIS — Z170 Estrogen receptor positive status [ER+]: Principal | ICD-10-CM

## 2021-09-12 NOTE — Unmapped (Signed)
TEACHING ATTESTATION: I personally interviewed and examined the patient.  I agree with the above assessment and plan.  NED both clinically and radiographically.  She is closely followed by medical oncology and radiation oncology.  I will see her back if she has any abnormalities on annual diagnostic mammograms.  Lucretia Roers MD

## 2021-09-17 NOTE — Unmapped (Signed)
Abstraction Result Flowsheet Data    This patient's last AWV date: [facility] Last Medicare Wellness Visit Date: 07/18/2019  This patients last WCC/CPE date: : Not Found      Reason for Encounter  Reason for Encounter: Outreach  Primary Reason for Outreach: AWV  Text Message: No  MyChart Message: No  Outreach Call Outcome: Left message

## 2021-10-23 DIAGNOSIS — F418 Other specified anxiety disorders: Principal | ICD-10-CM

## 2021-10-23 MED ORDER — ESCITALOPRAM 20 MG TABLET
ORAL_TABLET | 0 refills | 0 days | Status: CP
Start: 2021-10-23 — End: ?

## 2021-10-23 NOTE — Unmapped (Signed)
Patient is requesting the following refill  Requested Prescriptions     Pending Prescriptions Disp Refills    escitalopram oxalate (LEXAPRO) 20 MG tablet [Pharmacy Med Name: ESCITALOPRAM 20MG  TABLETS] 30 tablet 0     Sig: TAKE 1 TABLET(20 MG) BY MOUTH DAILY       Recent Visits  Date Type Provider Dept   08/20/21 Office Visit Jenell Milliner, MD [facility]   Showing recent visits within past 365 days with a meds authorizing provider and meeting all other requirements  Future Appointments  Date Type Provider Dept   11/28/21 Appointment Jenell Milliner, MD [facility] [address] [facility]   Showing future appointments within next 365 days with a meds authorizing provider and meeting all other requirements       Labs: PHQ9:   PHQ-9 PHQ-9 TOTAL SCORE   08/20/2021  11:00 AM 2

## 2021-11-07 ENCOUNTER — Ambulatory Visit: Admit: 2021-11-07 | Discharge: 2021-11-08 | Payer: MEDICARE | Attending: Family | Primary: Family

## 2021-11-07 DIAGNOSIS — Z170 Estrogen receptor positive status [ER+]: Principal | ICD-10-CM

## 2021-11-07 DIAGNOSIS — C50811 Malignant neoplasm of overlapping sites of right female breast: Principal | ICD-10-CM

## 2021-11-07 MED ORDER — ANASTROZOLE 1 MG TABLET
ORAL_TABLET | Freq: Every day | ORAL | 3 refills | 90 days | Status: CP
Start: 2021-11-07 — End: ?

## 2021-11-07 NOTE — Unmapped (Unsigned)
BREAST MEDICAL ONCOLOGY ENCOUNTER  -----------------------------------------------------------------------------------------------------------------  Referring Physician: Nicki Guadalajara*  PCP: Jenell Milliner, MD  Consulting Physicians: Surgical oncology: Lucretia Roers, MD.  Radiation oncology: Rayetta Humphrey, MD.     Reason for Visit: The patient is seen in consultation at the request of Dr. Ruthy Dick [facility]* for evaluation of breast cancer.  -----------------------------------------------------------------------------------------------------  Assessment: [name] is a 76 y.o. female from [facility] with screening detected (R) HR+/HER2 low (2+) IDC, pT1c pN1a, G2, 1.7 cm. S/p right partial mastectomy 09/03/20 and radiation 01/09/21. Oncotype testing 8, no adjuvant chemotherapy was recommended. She completed radiation and subsequently started anastrozole on 01/23/21. She is clinically NED on today's visit.      Plan    1.  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   Chemotherapy: Oncotype score- 8, no adjuvant chemo   Radiation: s/p whole breast radiation and tumor bed boost, completed 01/09/21  Endocrine therapy: Plan for 5-10 years of AI. Started anastrozole 01/23/21.  Bone health: Dexa 04/18/21 pending, plan to start Fosamax as appropriate.  Breast imaging: MMG ue 07/2021 with Rad/Onc  Systemic staging: PET from 10/25/20 negative for evidence of metastatic disease. Following w/PCP for GI findings  Genetics: None at this time.     2.  Supportive care  -- Hx of liver disease. Takes lactulose 3x daily. Follows with Dr. Raford Pitcher.   -- Arthralgias. In back and right knee. Chronic. Takes Tylenol.   -- Back lesions. Abnormal hyperpigmented keratosis to right lower back. Referred to dermatologist.   -- Hot flashes: Vitamin E, 400 IU daily.     3. Follow-up  --  RTC 10/2021 with Archie Balboa APP    ** Prefers [facility] ** -----------------------------------------------------------------------------------------------------  Interval history:   [name] presents today in wheelchair with daughter to discuss endocrine therapy. She is recuperating from radiation related burning after completing RT. She states this is improved today     -- A few headaches, no vision changes.  -- Recent stress with daughter heath; recent heart issues.     -- Doing well overall. Spends more than half the day in the recliner due to choice.  -- Mild fatigue and anxiety.  -- Bleeding from right breast, secondary to radiation therapy, apparent on nightgown in mornings  -- Right knee arthralgias. Chronic. Stable today.   -- Lower back pain. Chronic. Worse with exertion. Takes Tylenol.   -- Back lesion. Non-tender.    Review of Systems: A complete twelve systems review was obtained and is positive per the HPI but otherwise negative in detail. See MIMS #1170 where available.    Functional Status: ECOG PS 0. ADLS independent. IADLS independent. Uses wheelchair for long walks. Cognition intact.     Social: Lives in [place], [place] with husband, daughter [name], and her granddaughter. Has another daughter [name], three grandchildren, and a brother nearby. Married for 50 years. Retired. 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     History of the Present Illness: [name] is a pleasant 76 y.o. female who  has a past medical history of Alcoholism (CMS-HCC), Alcoholism /alcohol abuse, Cirrhosis (CMS-HCC), Depression, Hypertension, Liver disease, and Malignant neoplasm of overlapping sites of right breast in female, estrogen receptor positive (CMS-HCC) (10/03/2020). She is seen in consultation at the request of Dr. Ruthy Dick [facility]* for evaluation of breast cancer. She had an abnormal  screening MMG 06/20/20. She had diagnostic MMG/US of right breast 07/23/20 with 0.7 x 0.6 x 0.6 cm irregular hypoechoic nonparallel mass with indistinct margins and peripheral vascularity at the 5:00 position approximately 8 CFN. Core bx of right breast 07/30/20 with IDC, G2, ER+(100%), PR+(100%), and HER-2 negative (2+ by IHC, FISH not amplified). She underwent right partial mastectomy 09/03/20. Final path: IDC, 1.7 cm in greatest dimension, G2, associated DCIS. Negative margins. 1/1 LN positive for carcinoma.     Oncology History Overview Note   Oncotype?     Malignant neoplasm of overlapping sites of right breast in female, estrogen receptor positive (CMS-HCC)   07/30/2020 Biopsy    A: Breast, right, core biopsy  - Invasive ductal carcinoma (see comment)  - Nottingham combined histologic grade: 2               Tubule formation: 3               Nuclear grade: 2  Mitotic score: 1  - Invasive carcinoma measures approximately 3.5 mm in this specimen  - Solid papillary ductal carcinoma in situ  - Ancillary studies (see biomarker synoptic)               Estrogen receptor: Positive (100%)               Progesterone receptor: Positive (100%)               HER2 IHC: Equivocal (2+)  HER2 FISH: Insufficient cellularity     09/03/2020 Surgery    A.  Breast, right, partial mastectomy  - Invasive ductal carcinoma (see synoptic report)  - Tumor size: 17 mm in greatest dimension  - Ductal carcinoma in situ (DCIS), grade 2, solid papillary and cribriform types  - Size extent of DCIS: At least 55 mm   - Margin status for specimen A (see extended margins below)          Invasive carcinoma: Negative, <1 mm from anterior and inferior margins          Ductal carcinoma in situ: Negative, <1 mm from superior margin  - Ancillary studies reported on prior core biopsy [id number]          Estrogen receptor: Positive (100%)          Progesterone receptor: Positive (100%)          HER2 IHC: Equivocal (2+)          HER2 FISH: Insufficient cellularity (see comment)     B.  Breast, right, superior margin,excision  - Single microscopic focus of ductal carcinoma in situ  - Margin: Negative, <1 mm    C.  Breast, right, lateral margin, excision  - Negative for carcinoma    D.  Breast, right,  inferior margin, excision  - Negative for carcinoma    E.  Breast, right, medial margin, excision  - Negative for carcinoma     F. Right axilla, sentinel lymph node, biopsy  - One lymph node positive for carcinoma (1/1)     11/08/2020 -  Radiation    Radiation Therapy Treatment Details (Noted on 11/08/2020)  Site: Right Breast  Technique: 3D CRT  Goal: No goal specified  Planned Treatment Start Date: No planned start date specified           Patient Active Problem List   Diagnosis    Alcoholic liver disease (CMS-HCC)    Hypokalemia    HTN (hypertension)    Chronic maxillary sinusitis  Alcoholism (CMS-HCC)    Encephalopathy    Depression with anxiety    Obesity    Macrocytosis    DJD (degenerative joint disease)    Malignant neoplasm of overlapping sites of right breast in female, estrogen receptor positive (CMS-HCC)    Liver lesion       Past Medical History:   Diagnosis Date    Alcoholism (CMS-HCC)     Alcoholism /alcohol abuse     Cirrhosis (CMS-HCC)     Depression     Hypertension     Liver disease     Malignant neoplasm of overlapping sites of right breast in female, estrogen receptor positive (CMS-HCC) 10/03/2020       Past Surgical History:   Procedure Laterality Date    BREAST BIOPSY Right     benign-a long time ago    BREAST LUMPECTOMY Right     4 2022    CHOLECYSTECTOMY      PR BX/REMV,LYMPH NODE,DEEP AXILL Right 09/03/2020    Procedure: BX/EXC LYMPH NODE; OPEN, DEEP AXILRY NODE;  Surgeon: Aris Everts, MD;  Location: ASC OR [facility];  Service: Surgical Oncology Breast    PR INTRAOPERATIVE SENTINEL LYMPH NODE ID W DYE INJECTION Right 09/03/2020    Procedure: INTRAOPERATIVE IDENTIFICATION SENTINEL LYMPH NODE(S) INCLUDE INJECTION NON-RADIOACTIVE DYE, WHEN PERFORMED;  Surgeon: Aris Everts, MD;  [facility]: [facility];  Service: Surgical Oncology Breast PR MASTECTOMY, PARTIAL Right 09/03/2020    Procedure: MASTECTOMY, PARTIAL (EG, LUMPECTOMY, TYLECTOMY, QUADRANTECTOMY, SEGMENTECTOMY);  Surgeon: Aris Everts, MD;  Location: [facility];  Service: Surgical Oncology Breast    PR UPPER GI ENDOSCOPY,BIOPSY N/A 02/03/2018    Procedure: UGI ENDOSCOPY; WITH BIOPSY, SINGLE OR MULTIPLE;  Surgeon: Alfred Levins, MD;  Location: [facility] PROCEDURES [facility];  Service: Gastroenterology    RADIATION Right     unsure finish date    TUBAL LIGATION       Gyn History: G2P2. Menopause in her 35s. Denies HRT.     Medications:  Current Outpatient Medications   Medication Sig Dispense Refill    acetaminophen (TYLENOL) 325 MG tablet Take 2 tablets (650 mg total) by mouth every six (6) hours as needed for pain.  0    amLODIPine (NORVASC) 5 MG tablet Take 1 tablet (5 mg total) by mouth daily. 30 tablet 11    anastrozole (ARIMIDEX) 1 mg tablet TAKE 1 TABLET(1 MG) BY MOUTH DAILY 90 tablet 0    carvediloL (COREG) 3.125 MG tablet Take 1 tablet (3.125 mg total) by mouth Two (2) times a day. 180 tablet 1    cyanocobalamin 1000 MCG tablet Take 1 tablet (1,000 mcg total) by mouth daily.      escitalopram oxalate (LEXAPRO) 20 MG tablet TAKE 1 TABLET(20 MG) BY MOUTH DAILY 30 tablet 0    folic acid (FOLVITE) 1 MG tablet Take 1 tablet (1 mg total) by mouth daily. 90 tablet 1    lactulose (CHRONULAC) 10 gram/15 mL solution TAKE 15 ML BY MOUTH THREE TIMES DAILY, TAKE ENOUGH TO HAVE 3 BOWEL MOVEMENTS PER DAY 1350 mL 5    magnesium oxide (MAG-OX) 400 mg (241.3 mg elemental magnesium) tablet Take 1 tablet (400 mg total) by mouth Two (2) times a day. 180 tablet 1    potassium chloride 20 MEQ CR tablet Take 1 tablet (20 mEq total) by mouth Two (2) times a day. 180 tablet 1     No current facility-administered medications for this visit.  Allergies:  Allergies   Allergen Reactions    Sulfa (Sulfonamide Antibiotics) Anaphylaxis    Sulfur Anaphylaxis     Tolerated sulfur colloid injection without incident 09/03/2020.    Aspirin      thrombocytopenia       Family History: Cancer-related family history is negative for Breast cancer, Cancer, Colon cancer, and Ovarian cancer.    Social History:   Social History     Social History Narrative    Not on file     Physical Examination:  Vital Signs: There were no vitals taken for this visit.  General: Healthy-appearing patient in no acute distress.Marland Kitchen  HEENT: PERRTLA, EOMI, OP clear  Cardiovascular: Normal S1 and S2. RRR. No audible murmurs.  Respiratory: Chest clear to percussion and auscultation.   Gastrointestinal: Abdomen soft without masses and tenderness, no hepatosplenomegaly or other masses.   Musculoskeletal: No bony pain or tenderness.   Skin: Abnormal hyperpigmented keratosis of lower right back. Two macules, not concerning.  Breasts: (R) breast: Extensive radiation related burning across breast and extending into axilla. Well healed incision in UOQ. Stigmata from recent drain removal. Healed incision in inferior aspect extending down to the inframammary fold. (L) breast: benign. No axillary adenopathy bilat.   Neurologic:  Alert and oriented. Grossly non-focal  Lymphatic: No cervical, axillary or supraclavicular adenopathy.   Extremity: No lower extremity edema or upper extremity lymphedema    Photo from 01/14/21          DATA REVIEW:  Laboratory: I personally reviewed the pertinent laboratory data.  Radiology: I personally reviewed the pertinent imaging.  Pathology: I personally reviewed the pathology report.

## 2021-11-07 NOTE — Unmapped (Incomplete)
It was a pleasure to see you today.    Your Breast Oncology Care team is:  Breast Oncology Advanced Practice Provider: Johnette Abraham, FNP-BC, AOCNP  Breast Oncology Nurse Navigator: Modena Jansky, RN, OCN    For appointments, please call [phone]     Nurse Navigator:   Modena Jansky, RN: phone: [phone]   Prescription refills: [phone]   FAX: [phone]   For emergencies, evenings or weekends, please call [phone] and ask for the oncology fellow on call.     Reasons to call emergency line may include:   Fever of 100.5 or greater   Nausea and/or vomiting not relieved with nausea medicine   Diarrhea or constipation not relieved with bowel regimen   Severe pain not relieved with usual pain regimen       Labs from today:  Lab Results   Component Value Date    WBC 8.1 03/07/2019    HGB 15.6 03/07/2019    HCT 47.3 (H) 03/07/2019    MCV 103.0 (H) 03/07/2019    PLT 192 03/07/2019       Lab Results   Component Value Date    NEUTROABS 8.1 (H) 06/03/2017         Chemistry        Component Value Date/Time    NA 144 08/20/2021 1143    K 4.0 08/20/2021 1143    CL 107 08/20/2021 1143    CO2 31.0 08/20/2021 1143    BUN 8 (L) 08/20/2021 1143    CREATININE 0.52 (L) 08/20/2021 1143    GLU 81 08/20/2021 1143        Component Value Date/Time    CALCIUM 9.6 08/20/2021 1143    ALKPHOS 132 (H) 08/20/2021 1143    AST 27 08/20/2021 1143    ALT 12 08/20/2021 1143    BILITOT 1.3 (H) 08/20/2021 1143            Lab Results   Component Value Date    ALT 12 08/20/2021    AST 27 08/20/2021    GGT 31 04/08/2018    ALKPHOS 132 (H) 08/20/2021    BILITOT 1.3 (H) 08/20/2021       Future Appointments   Date Time Provider Department Center   11/07/2021 12:30 PM Marcy Panning, FNP [facility]   11/28/2021  2:20 PM Jenell Milliner, MD [facility]   07/09/2022 11:30 AM [facility] RM 2 [facility]   07/09/2022  1:30 PM Peterson Lombard, MD [facility]

## 2021-11-12 NOTE — Unmapped (Signed)
Abstraction Result Flowsheet Data    This patient's last AWV date: [facility] Last Medicare Wellness Visit Date: 07/18/2019  This patients last WCC/CPE date: : Not Found      Reason for Encounter  Reason for Encounter: Outreach  Primary Reason for Outreach: AWV  Text Message: No  MyChart Message: No  Outreach Call Outcome: Left message

## 2021-11-19 NOTE — Unmapped (Signed)
Abstraction Result Flowsheet Data    This patient's last AWV date: [facility] Last Medicare Wellness Visit Date: 07/18/2019  This patients last WCC/CPE date: : Not Found      Reason for Encounter  Reason for Encounter: Outreach  Primary Reason for Outreach: AWV  Text Message: No  MyChart Message: No  Outreach Call Outcome: Left message

## 2021-11-21 NOTE — Unmapped (Signed)
[name] is a 76 y.o. female being seen for a comprehensive physical exam.      HPI:    I last saw the pt in 07/2021.  She was seen then with elevated BP complaint.  She remains compliant with her medications. She takes Coreg 3.125 mg bid for HTN.   She has a history of essential hypertension, alcoholic liver disease( pt stopped drinking alcohol 10 yrs ago), depression/anxiety, obesity,  Breast cancer, macrocytosis with history of alcoholism and DJD.  Pt started taking her BP at home for the past couple of months. She has had recent oncology visits for right sided breast cancer (diagnosed 08/2020)and her BP has been elevated at those visits.   Her home BP readings range 150-180/80-100. The pt has occasional headache but denies chest pain or other CV related symptoms. Her BMI is 38.  I started amlodipine 5 mg daily and she will continue with BB as previously directed. She will continue home BP monitoring and call me if her BP remains elevated. She is advised on low dietary salt intake and exercise. Her BP today is normal and she reports norma home readings as well.   Her BMI is 40 and she struggles with her weight.  She is advised on caloric restriction to promote weight loss.  She admits to over eating.     She has a history of alcoholism, alcoholic liver disease, macrocytosis  and encephalopathy.  She has not needed to take lactulose since she states she has 3 BM's a day and her mentation if normal.  She stopped drinking alcohol 8 yrs ago.     Liver US done 03/2019 and results revealed cirrhotic liver morphology, unchanged lesion in the hepatic lobe likely representing a hepatic calcification and no hepatic lesions were identified.  She was to schedule follow up appt with the liver clinic and she was seen in October/2020. LFT's done in 07/2021 were normal except for chronically elevated alk phos but improved at 132 and total bilirubin of 1.3. she is referred back to [facility] GI/liver clinic.  Repeat RUQ US done 08/2021 remained stable.    Depression history: well controlled on Lexapro 20 mg daily.    She has a history of right sided breast cancer: s/p partial mastectomy in 2022. She is followed by [facility] breast oncology and last seen early this month. Her last breast imaging was in 07/2021. She continues on anastrozole 1 mg daily for a total of 5 yrs.       CMP done in 07/2021 was normal except for improved and near normal total bilirubin at 1.3 and stable alk phos at 132. TSH and lipid panel were normal at that time.         Current Outpatient Medications   Medication Sig Dispense Refill    acetaminophen (TYLENOL) 325 MG tablet Take 2 tablets (650 mg total) by mouth every six (6) hours as needed for pain.  0    amLODIPine (NORVASC) 5 MG tablet Take 1 tablet (5 mg total) by mouth daily. 30 tablet 11    anastrozole (ARIMIDEX) 1 mg tablet Take 1 tablet (1 mg total) by mouth daily. 90 tablet 3    calcium carbonate 1,500 mg (600 mg elem calcium) tablet Take 1 tablet (600 mg of elem calcium total) by mouth daily.      cyanocobalamin 1000 MCG tablet Take 1 tablet (1,000 mcg total) by mouth daily.      folic acid (FOLVITE) 1 MG tablet Take 1 tablet (  1 mg total) by mouth daily. 90 tablet 1    lactulose (CHRONULAC) 10 gram/15 mL solution TAKE 15 ML BY MOUTH THREE TIMES DAILY, TAKE ENOUGH TO HAVE 3 BOWEL MOVEMENTS PER DAY 1350 mL 5    magnesium oxide (MAG-OX) 400 mg (241.3 mg elemental magnesium) tablet Take 1 tablet (400 mg total) by mouth Two (2) times a day. 180 tablet 1    potassium chloride 20 MEQ CR tablet Take 1 tablet (20 mEq total) by mouth Two (2) times a day. 180 tablet 1    vitamin E-268 mg, 400 UNIT, 268 mg (400 UNIT) capsule Take 1 capsule (268 mg total) by mouth daily. 2 tabs      carvediloL (COREG) 3.125 MG tablet Take 1 tablet (3.125 mg total) by mouth Two (2) times a day. 180 tablet 3    escitalopram oxalate (LEXAPRO) 20 MG tablet Take 1 tablet (20 mg total) by mouth daily. TAKE 1 TABLET(20 MG) BY MOUTH DAILY 90 tablet 3     No current facility-administered medications for this visit.       I have reviewed PMH, Allergies, SH, and Meds within Epic at today's clinic visit.   ROS:     General: no fatigue, unexpected weight loss or gain, overall feels well  ENT: denies visual problems ear symptoms, thoat symptoms, nasal congestion,   Cardiovascular: denies chest pain, palpitations, tachycardia, lower extremity swelling, claudication  Respiratory: denies dyspnea, dyspnea on exertion, wheezing, cough  Gastrointestinal: denies nausea, vomiting, dyspepsia, dysphagia, chronic constipation, diarrhea, melana, hematochezia  GU:No abnormal vaginal discharge, pelvic pain, irregular menses, vasomotor symptoms  Musculoskeletal: denies joint pains, muscle pain or weakness  Integumentary: denies rashes, or other skin lesions  Neurological: denies headaches, dizziness, numbness, tingling, syncope  Psych: Denies symptoms suggesting anxiety, depression, sleep disturbance    Health Maintenance:   TD Vaccine-  advised booster at local pharmacy  Pap/Pelvic Exam- deferred as per guidelines  Mammogram-07/2021  BDS-04/2021  Colonoscopy- referral submitted 07/2021/another referral submitted today  Pneumovax-2019  Prevnar-2021  ShingRix- series completed 2020  Eye exam- she is reminded to schedule eye exam  ADL's - 100 %  ACP- FULL code  Safety home features reviewed  Hearing - normal      Physical Exam:  Blood pressure 110/62, pulse 82, temperature 36.7 ??C (98 ??F), height 162.6 cm (5' 4.02), weight (!) 107 kg (236 lb), SpO2 96 %.  Gen: well nourished, well appearing, in no acute distress. alert and oriented  to person, place, and time.  HEENT:  normocephalic atraumatic, mucous membranes moist, extraocular muscles intact, pupils equally round and reactive to light and accommodation bilaterally, bilateral tympanic membranes intact and reactive to light, bilateral sclera anicteric, no conjunctival injection.    Neck: no cervical lymphadenopathy, thyromegaly or JVD present  Heart: regular rate and rhythm, S1 and S2 are normal, no  murmurs/rubs/or gallops  Lungs: clear to auscultation bilaterally, no rales/rhonchi/wheezes  Abd:  Normoactive bowel sounds, abdomen soft, non-tender and not distended, no hepatosplenomegaly or masses. No rebound or guarding.   Breast:  No palpable masses, skin dimpling, rash, discharge or lymphadenopathy present.  GU: defer  Musco:  no joint tenderness, deformity, effusions. Full range of motion in shoulders, elbows, wrists, and hands.    Neuro: CN 3-12 grossly intact, normal cerebellar function, normal gait. no focal deficits.  Skin: no visible lesions or rashes      PHQ-2 Score: 0  PHQ-2 Total Score : 0    ASCVD risk:  The 10-year ASCVD  risk score (Arnett DK, et al., 2019) is: 15.7%    Values used to calculate the score:      Age: 61 years      Sex: Female      Is Non-Hispanic African American: No      Diabetic: No      Tobacco smoker: No      Systolic Blood Pressure: 110 mmHg      Is BP treated: Yes      HDL Cholesterol: 63 mg/dL      Total Cholesterol: 157 mg/dL    Note: For patients with SBP <90 or >200, Total Cholesterol <130 or >320, HDL <20 or >100 which are outside of the allowable range, the calculator will use these upper or lower values to calculate the patient???s risk score.          A/P:  1.  Annual Exam   -Preventive Services      - Counseled pt on exercise, diet, and weight      - Recommended yearly dental and vision screenings.      - Vaccines-  tetanus booster recommended- she will get at local pharmacy     -  Health Maintenance referrals- [facility] liver clinic and colonoscopy    -  Annual labs-   Orders Placed This Encounter   Procedures    Ambulatory referral to Hepatology     Standing Status:   Future     Standing Expiration Date:   11/29/2022     Referral Priority:   Routine     Referral Type:   Generic Referral     Number of Visits Requested:   1    Colonoscopy     Schedule at [facility] location     Standing Status:   Future Standing Expiration Date:   11/29/2022     Order Specific Question:   What is the patient's sedation requirement?     Answer:   Adult Sedation     Order Specific Question:   Performing Location:     Answer:   [place]     Comments:   [place]     Order Specific Question:   Reason for exam     Answer:   Colon cancer screening   The pt had recent TSH, CMP an lipid panel done 07/2021.    2.  ---  Medications reviewed with the pt  Follow up- 6 months        Note - This record has been created using [employer]. Chart creation errors have been sought, but may not always have been located. Such creation errors do not reflect on the standard of medical care.    Jenell Milliner, MD

## 2021-11-25 DIAGNOSIS — F418 Other specified anxiety disorders: Principal | ICD-10-CM

## 2021-11-25 MED ORDER — ESCITALOPRAM 20 MG TABLET
ORAL_TABLET | 0 refills | 0 days | Status: CP
Start: 2021-11-25 — End: ?

## 2021-11-25 NOTE — Unmapped (Signed)
Patient is requesting the following refill  Requested Prescriptions     Pending Prescriptions Disp Refills    escitalopram oxalate (LEXAPRO) 20 MG tablet [Pharmacy Med Name: ESCITALOPRAM 20MG  TABLETS] 30 tablet 0     Sig: TAKE 1 TABLET(20 MG) BY MOUTH DAILY       Recent Visits  Date Type Provider Dept   08/20/21 Office Visit Jenell Milliner, MD [facility]   Showing recent visits within past 365 days with a meds authorizing provider and meeting all other requirements  Future Appointments  Date Type Provider Dept   11/28/21 Appointment Jenell Milliner, MD [facility] [address] [facility]   Showing future appointments within next 365 days with a meds authorizing provider and meeting all other requirements

## 2021-11-27 MED ORDER — CARVEDILOL 3.125 MG TABLET
ORAL_TABLET | Freq: Two times a day (BID) | ORAL | 1 refills | 90 days
Start: 2021-11-27 — End: 2022-11-28

## 2021-11-27 MED ORDER — ESCITALOPRAM 20 MG TABLET
ORAL_TABLET | Freq: Every day | ORAL | 0 refills | 30.00000 days
Start: 2021-11-27 — End: ?

## 2021-11-28 ENCOUNTER — Ambulatory Visit: Admit: 2021-11-28 | Discharge: 2021-11-29 | Payer: MEDICARE

## 2021-11-28 DIAGNOSIS — Z1211 Encounter for screening for malignant neoplasm of colon: Principal | ICD-10-CM

## 2021-11-28 DIAGNOSIS — K709 Alcoholic liver disease, unspecified: Principal | ICD-10-CM

## 2021-11-28 DIAGNOSIS — Z0000 Encounter for general adult medical examination without abnormal findings: Principal | ICD-10-CM

## 2021-11-28 DIAGNOSIS — F1020 Alcohol dependence, uncomplicated: Principal | ICD-10-CM

## 2021-11-28 DIAGNOSIS — K766 Portal hypertension: Principal | ICD-10-CM

## 2021-11-28 DIAGNOSIS — F418 Other specified anxiety disorders: Principal | ICD-10-CM

## 2021-11-28 DIAGNOSIS — I10 Essential (primary) hypertension: Principal | ICD-10-CM

## 2021-11-28 MED ORDER — ESCITALOPRAM 20 MG TABLET
ORAL_TABLET | Freq: Every day | ORAL | 3 refills | 90 days | Status: CP
Start: 2021-11-28 — End: ?

## 2021-11-28 MED ORDER — CARVEDILOL 3.125 MG TABLET
ORAL_TABLET | Freq: Two times a day (BID) | ORAL | 3 refills | 90.00000 days | Status: CP
Start: 2021-11-28 — End: 2022-11-29

## 2021-11-28 NOTE — Unmapped (Signed)
Get Tetanus booster at your local pharmacy  New referral for [facility] Liver clinic at [facility] location in [place]  New referral for screening colonoscopy- at [facility] location

## 2021-12-25 DIAGNOSIS — F418 Other specified anxiety disorders: Principal | ICD-10-CM

## 2021-12-25 MED ORDER — ESCITALOPRAM 20 MG TABLET
ORAL_TABLET | 0 refills | 0 days
Start: 2021-12-25 — End: ?

## 2021-12-25 NOTE — Unmapped (Signed)
Patient is requesting the following refill  Requested Prescriptions     Pending Prescriptions Disp Refills    escitalopram oxalate (LEXAPRO) 20 MG tablet [Pharmacy Med Name: ESCITALOPRAM 20MG  TABLETS] 30 tablet 0     Sig: TAKE 1 TABLET(20 MG) BY MOUTH DAILY       Recent Visits  Date Type Provider Dept   11/28/21 Office Visit Jenell Milliner, MD [facility] [address] [facility]   08/20/21 Office Visit Jenell Milliner, MD [facility]   Showing recent visits within past 365 days with a meds authorizing provider and meeting all other requirements  Future Appointments  Date Type Provider Dept   06/09/22 Appointment Jenell Milliner, MD [facility] [address] [facility]   Showing future appointments within next 365 days with a meds authorizing provider and meeting all other requirements

## 2022-02-12 MED ORDER — POTASSIUM CHLORIDE ER 20 MEQ TABLET,EXTENDED RELEASE(PART/CRYST)
ORAL_TABLET | Freq: Two times a day (BID) | ORAL | 1 refills | 90 days | Status: CP
Start: 2022-02-12 — End: 2023-02-12

## 2022-02-12 MED ORDER — FOLIC ACID 1 MG TABLET
ORAL_TABLET | Freq: Every day | ORAL | 1 refills | 90 days | Status: CP
Start: 2022-02-12 — End: 2023-02-12

## 2022-02-14 DIAGNOSIS — I10 Essential (primary) hypertension: Principal | ICD-10-CM

## 2022-02-14 DIAGNOSIS — K766 Portal hypertension: Principal | ICD-10-CM

## 2022-02-14 MED ORDER — CARVEDILOL 3.125 MG TABLET
ORAL_TABLET | 3 refills | 0 days | Status: CP
Start: 2022-02-14 — End: ?

## 2022-02-14 NOTE — Unmapped (Signed)
Patient is requesting the following refill  Requested Prescriptions     Pending Prescriptions Disp Refills    carvediloL (COREG) 3.125 MG tablet [Pharmacy Med Name: CARVEDILOL 3.125MG  TABLETS] 180 tablet 3     Sig: TAKE 1 TABLET(3.125 MG) BY MOUTH TWICE DAILY       Recent Visits  Date Type Provider Dept   11/28/21 Office Visit Jenell Milliner, MD [facility] [address] [facility]   08/20/21 Office Visit Jenell Milliner, MD [facility]   Showing recent visits within past 365 days with a meds authorizing provider and meeting all other requirements  Future Appointments  Date Type Provider Dept   06/09/22 Appointment Jenell Milliner, MD  [facility] [address] [facility]   Showing future appointments within next 365 days with a meds authorizing provider and meeting all other requirements       Labs:

## 2022-02-17 MED ORDER — MAGNESIUM OXIDE 400 MG (241.3 MG MAGNESIUM) TABLET
ORAL_TABLET | Freq: Two times a day (BID) | ORAL | 1 refills | 90 days | Status: CP
Start: 2022-02-17 — End: 2023-02-17

## 2022-02-20 ENCOUNTER — Ambulatory Visit: Admit: 2022-02-20 | Discharge: 2022-02-21 | Payer: MEDICARE | Attending: Family | Primary: Family

## 2022-02-20 DIAGNOSIS — C50811 Malignant neoplasm of overlapping sites of right female breast: Principal | ICD-10-CM

## 2022-02-20 DIAGNOSIS — Z170 Estrogen receptor positive status [ER+]: Principal | ICD-10-CM

## 2022-02-20 NOTE — Unmapped (Signed)
BREAST MEDICAL ONCOLOGY ENCOUNTER  -----------------------------------------------------------------------------------------------------------------  Referring Physician: Nicki Guadalajara*  PCP: Jenell Milliner, MD  Consulting Physicians: Surgical oncology: Lucretia Roers, MD.  Radiation oncology: Rayetta Humphrey, MD.     Reason for Visit: The patient is seen in consultation at the request of Dr. Ruthy Dick [facility]* for evaluation of breast cancer.  -----------------------------------------------------------------------------------------------------  Assessment: [name] is a 76 y.o. female from [facility] with screening detected (R) HR+/HER2 low (2+) IDC, pT1c pN1a, G2, 1.7 cm. S/p right partial mastectomy 09/03/20 and radiation 01/09/21. Oncotype testing 8, no adjuvant chemotherapy was recommended. She completed radiation and subsequently started anastrozole on 01/23/21. She is clinically NED on today's visit.      Plan  1.  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   Chemotherapy: Oncotype score- 8, no adjuvant chemo   Radiation: s/p whole breast radiation and tumor bed boost, completed 01/09/21  Endocrine therapy: Plan for 5-10 years of AI. Started anastrozole 01/23/21.  Bone health: Dexa 04/18/21 osteopenia; lowest T score -2.4, may have some upcoming dental procedures coming up. Plan to call her daughter, POA to discuss starting Boniva. Pt will need dental clearance prior to initiating therapy.   Breast imaging: MMG on 07/2021, BI-RADS 2, repeat in 1 year  Systemic staging: PET from 10/25/20 negative for evidence of metastatic disease. Following w/PCP for GI findings  Genetics: None at this time.     2.  Supportive care  -- Hx of liver disease. Takes lactulose 3x daily. Follows with Dr. Raford Pitcher.   -- Arthralgias. In back and right knee. Chronic. Stable. Takes Tylenol.   -- Back lesions. Abnormal hyperpigmented keratosis to right lower back. Referred to dermatologist.   -- Hot flashes: Vitamin E, 400 IU daily.     3. Follow-up  --  RTC 3 months with Nilda Riggs  (New breast medical oncologist, after Dr. Jaclyn Shaggy departure)    ** Prefers [facility] **   -----------------------------------------------------------------------------------------------------  Interval history:   -- Patient presenting for follow-up.  -- No new breast/CW complaints.  -- Tolerating anastrozole well.  -- Mild fatigue and anxiety.  -- Right knee and lower back arthralgias. Chronic. Stable today.     Review of Systems: A complete twelve systems review was obtained and is positive per the HPI but otherwise negative in detail. See MIMS #1170 where available.    Functional Status: ECOG PS 0. ADLS independent. IADLS independent. Uses wheelchair for long walks. Cognition intact.     Social: Lives in [place], [place] with husband, daughter [name], and her granddaughter. Has another daughter [name], three grandchildren, and a brother nearby. Married for 50 years. Retired. 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     History of the Present Illness: [name] is a pleasant 76 y.o. female who  has a past medical history of Alcoholism (CMS-HCC), Alcoholism /alcohol abuse, Cirrhosis (CMS-HCC), Depression, Hypertension, Liver disease, and Malignant neoplasm of overlapping sites of right breast in female, estrogen receptor positive (CMS-HCC) (10/03/2020). She is seen in consultation at the request of Dr. Ruthy Dick [facility]* for evaluation of breast cancer. She had an abnormal screening MMG 06/20/20. She had diagnostic MMG/US of right breast 07/23/20 with 0.7 x 0.6 x 0.6 cm irregular hypoechoic nonparallel mass with indistinct margins and peripheral vascularity at the 5:00 position approximately 8 CFN. Core bx of right breast 07/30/20 with  IDC, G2, ER+(100%), PR+(100%), and HER-2 negative (2+ by IHC, FISH not amplified). She underwent right partial mastectomy 09/03/20. Final path: IDC, 1.7 cm in greatest dimension, G2, associated DCIS. Negative margins. 1/1 LN positive for carcinoma.     Oncology History Overview Note   Oncotype?     Malignant neoplasm of overlapping sites of right breast in female, estrogen receptor positive (CMS-HCC)   07/30/2020 Biopsy    A: Breast, right, core biopsy  - Invasive ductal carcinoma (see comment)  - Nottingham combined histologic grade: 2               Tubule formation: 3               Nuclear grade: 2  Mitotic score: 1  - Invasive carcinoma measures approximately 3.5 mm in this specimen  - Solid papillary ductal carcinoma in situ  - Ancillary studies (see biomarker synoptic)               Estrogen receptor: Positive (100%)               Progesterone receptor: Positive (100%)               HER2 IHC: Equivocal (2+)  HER2 FISH: Insufficient cellularity     09/03/2020 Surgery    A.  Breast, right, partial mastectomy  - Invasive ductal carcinoma (see synoptic report)  - Tumor size: 17 mm in greatest dimension  - Ductal carcinoma in situ (DCIS), grade 2, solid papillary and cribriform types  - Size extent of DCIS: At least 55 mm   - Margin status for specimen A (see extended margins below)          Invasive carcinoma: Negative, <1 mm from anterior and inferior margins          Ductal carcinoma in situ: Negative, <1 mm from superior margin  - Ancillary studies reported on prior core biopsy [id number]          Estrogen receptor: Positive (100%)          Progesterone receptor: Positive (100%)          HER2 IHC: Equivocal (2+)          HER2 FISH: Insufficient cellularity (see comment)     B.  Breast, right, superior margin,excision  - Single microscopic focus of ductal carcinoma in situ  - Margin: Negative, <1 mm    C.  Breast, right, lateral margin, excision  - Negative for carcinoma    D.  Breast, right,  inferior margin, excision  - Negative for carcinoma    E.  Breast, right, medial margin, excision  - Negative for carcinoma     F. Right axilla, sentinel lymph node, biopsy  - One lymph node positive for carcinoma (1/1)     09/03/2020 -  Cancer Staged    Staging form: Breast, AJCC 8th Edition  - Pathologic stage from 09/03/2020: Stage IA (pT1c, pN1a, cM0, G2, ER+, PR+, HER2-, Oncotype DX score: 8) - Signed by Marcy Panning, FNP on 11/06/2021       11/08/2020 -  Radiation    Radiation Therapy Treatment Details (Noted on 11/08/2020)  Site: Right Breast  Technique: 3D CRT  Goal: No goal specified  Planned Treatment Start Date: No planned start date specified         Patient Active Problem List   Diagnosis    Alcoholic liver disease (CMS-HCC)    Hypokalemia    HTN (hypertension)  Chronic maxillary sinusitis    Alcoholism (CMS-HCC)    Encephalopathy    Depression with anxiety    Morbid (severe) obesity due to excess calories (CMS-HCC)    Macrocytosis    DJD (degenerative joint disease)    Malignant neoplasm of overlapping sites of right breast in female, estrogen receptor positive (CMS-HCC)    Liver lesion       Past Medical History:   Diagnosis Date    Alcoholism (CMS-HCC)     Alcoholism /alcohol abuse     Cirrhosis (CMS-HCC)     Depression     Hypertension     Liver disease     Malignant neoplasm of overlapping sites of right breast in female, estrogen receptor positive (CMS-HCC) 10/03/2020       Past Surgical History:   Procedure Laterality Date    BREAST BIOPSY Right     benign-a long time ago    BREAST LUMPECTOMY Right     4 2022    CHOLECYSTECTOMY      PR BX/REMV,LYMPH NODE,DEEP AXILL Right 09/03/2020    Procedure: BX/EXC LYMPH NODE; OPEN, DEEP AXILRY NODE;  Surgeon: Aris Everts, MD;  Location: ASC OR [facility];  Service: Surgical Oncology Breast    PR INTRAOPERATIVE SENTINEL LYMPH NODE ID W DYE INJECTION Right 09/03/2020    Procedure: INTRAOPERATIVE IDENTIFICATION SENTINEL LYMPH NODE(S) INCLUDE INJECTION NON-RADIOACTIVE DYE, WHEN PERFORMED;  Surgeon: Aris Everts, MD;  Location: [facility] OR [facility];  Service: Surgical Oncology Breast    PR MASTECTOMY, PARTIAL Right 09/03/2020    Procedure: MASTECTOMY, PARTIAL (EG, LUMPECTOMY, TYLECTOMY, QUADRANTECTOMY, SEGMENTECTOMY);  Surgeon: Aris Everts, MD;  Location: ASC OR [facility];  Service: Surgical Oncology Breast    PR UPPER GI ENDOSCOPY,BIOPSY N/A 02/03/2018    Procedure: UGI ENDOSCOPY; WITH BIOPSY, SINGLE OR MULTIPLE;  Surgeon: Alfred Levins, MD;  Location: [facility];  Service: Gastroenterology    RADIATION Right     unsure finish date    TUBAL LIGATION       Gyn History: G2P2. Menopause in her 43s. Denies HRT.     Medications:  Current Outpatient Medications   Medication Sig Dispense Refill    acetaminophen (TYLENOL) 325 MG tablet Take 2 tablets (650 mg total) by mouth every six (6) hours as needed for pain.  0    amLODIPine (NORVASC) 5 MG tablet Take 1 tablet (5 mg total) by mouth daily. 30 tablet 11    anastrozole (ARIMIDEX) 1 mg tablet Take 1 tablet (1 mg total) by mouth daily. 90 tablet 3    carvediloL (COREG) 3.125 MG tablet TAKE 1 TABLET(3.125 MG) BY MOUTH TWICE DAILY 180 tablet 3    cyanocobalamin 1000 MCG tablet Take 1 tablet (1,000 mcg total) by mouth daily.      escitalopram oxalate (LEXAPRO) 20 MG tablet Take 1 tablet (20 mg total) by mouth daily. TAKE 1 TABLET(20 MG) BY MOUTH DAILY 90 tablet 3    folic acid (FOLVITE) 1 MG tablet Take 1 tablet (1 mg total) by mouth daily. 90 tablet 1    lactulose (CHRONULAC) 10 gram/15 mL solution TAKE 15 ML BY MOUTH THREE TIMES DAILY, TAKE ENOUGH TO HAVE 3 BOWEL MOVEMENTS PER DAY 1350 mL 5    magnesium oxide (MAG-OX) 400 mg (241.3 mg elemental magnesium) tablet Take 1 tablet (400 mg total) by mouth Two (2) times a day. 180 tablet 1    potassium chloride 20 MEQ ER tablet Take 1 tablet (20 mEq total) by mouth  Two (2) times a day. 180 tablet 1    vitamin E-268 mg, 400 UNIT, 268 mg (400 UNIT) capsule Take 1 capsule (268 mg total) by mouth daily. 2 tabs      calcium carbonate 1,500 mg (600 mg elem calcium) tablet Take 1 tablet (600 mg of elem calcium total) by mouth daily. (Patient not taking: Reported on 02/20/2022)       No current facility-administered medications for this visit.       Allergies:  Allergies   Allergen Reactions    Sulfa (Sulfonamide Antibiotics) Anaphylaxis    Sulfur Anaphylaxis     Tolerated sulfur colloid injection without incident 09/03/2020.    Aspirin      thrombocytopenia       Family History: Cancer-related family history is negative for Breast cancer, Cancer, Colon cancer, and Ovarian cancer.    Social History:   Social History     Social History Narrative    Daughter fills med boxes weekly.      Physical Examination:  Vital Signs: BP 130/61  - Pulse 73  - Temp 36.7 ??C (98 ??F)  - Wt (!) 108.2 kg (238 lb 9.6 oz)  - BMI 40.94 kg/m??   General: Healthy-appearing patient in no acute distress.Marland Kitchen  HEENT: PERRTLA, EOMI, OP clear  Cardiovascular: Normal S1 and S2. RRR. No audible murmurs.  Respiratory: Chest clear to percussion and auscultation.   Gastrointestinal: Abdomen soft without masses and tenderness, no hepatosplenomegaly or other masses.   Musculoskeletal: No bony pain or tenderness.   Skin: Abnormal hyperpigmented keratosis of lower right back. Two macules, not concerning.  Breasts: (R) breast: Benign. Well healed incision in UOQ. Healed incision in inferior aspect extending down to the inframammary fold. (L) breast: benign. No axillary adenopathy bilat.   Neurologic:  Alert and oriented. Grossly non-focal  Lymphatic: No cervical, axillary or supraclavicular adenopathy.   Extremity: No lower extremity edema or upper extremity lymphedema    DATA REVIEW:  Laboratory: I personally reviewed the pertinent laboratory data.  Radiology: I personally reviewed the pertinent imaging.  Pathology: I personally reviewed the pathology report.

## 2022-05-08 ENCOUNTER — Ambulatory Visit: Admit: 2022-05-08 | Discharge: 2022-05-09 | Payer: MEDICARE | Attending: Family | Primary: Family

## 2022-05-08 DIAGNOSIS — Z780 Asymptomatic menopausal state: Principal | ICD-10-CM

## 2022-05-08 DIAGNOSIS — C50811 Malignant neoplasm of overlapping sites of right female breast: Principal | ICD-10-CM

## 2022-05-08 DIAGNOSIS — Z170 Estrogen receptor positive status [ER+]: Principal | ICD-10-CM

## 2022-05-08 NOTE — Unmapped (Signed)
BREAST MEDICAL ONCOLOGY ENCOUNTER  -----------------------------------------------------------------------------------------------------------------  Referring Physician: Nicki Guadalajara*  PCP: Jenell Milliner, MD  Consulting Physicians: Surgical oncology: Lucretia Roers, MD.  Radiation oncology: Rayetta Humphrey, MD.     Reason for Visit: The patient is seen in consultation at the request of Dr. Ruthy Dick [facility]* for evaluation of breast cancer.  -----------------------------------------------------------------------------------------------------  Assessment: [name] is a 76 y.o. female from [facility] with screening detected (R) HR+/HER2 low (2+) IDC, pT1c pN1a, G2, 1.7 cm. S/p right partial mastectomy 09/03/20 and radiation 01/09/21. Oncotype testing 8, no adjuvant chemotherapy was recommended. She completed radiation and subsequently started anastrozole on 01/23/21. She is clinically NED on today's visit.      Plan  1.  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   Chemotherapy: Oncotype score- 8, no adjuvant chemo   Radiation: s/p whole breast radiation and tumor bed boost, completed 01/09/21  Endocrine therapy: Plan for 5-10 years of AI. Started anastrozole 01/23/21.  Bone health: Dexa 04/18/21 osteopenia; lowest T score -2.4, may have some upcoming dental procedures coming up. Plan to call her daughter, POA to discuss starting Boniva. Pt will need dental clearance prior to initiating therapy.   Breast imaging: MMG on 07/2021, BI-RADS 2, repeat in 1 year  Systemic staging: PET from 10/25/20 negative for evidence of metastatic disease. Following w/PCP for GI findings  Genetics: None at this time.     2.  Supportive care  -- Hx of liver disease. Takes lactulose 3x daily. Follows with Dr. Raford Pitcher.   -- Arthralgias. In back and right knee. Chronic. Stable. Takes Tylenol.   -- Back lesions. Abnormal hyperpigmented keratosis to right lower back. Follows with dermatologist.   -- Hot flashes: Vitamin E, 400 IU daily.     3. Follow-up  --  RTC 11/2022 with Archie Balboa APP+Dexa    ** Prefers [facility] **   -----------------------------------------------------------------------------------------------------  Interval history:   -- Patient presenting for follow-up.  -- No new breast/CW complaints.  -- Tolerating anastrozole well.  -- Mild fatigue and anxiety.  -- Right knee and lower back arthralgias. Chronic.   -- Denies any recent falls.    Review of Systems: A complete twelve systems review was obtained and is positive per the HPI but otherwise negative in detail. See MIMS #1170 where available.    Functional Status: ECOG PS 0. ADLS independent. IADLS independent. Uses wheelchair for long walks. Cognition intact.     Social: Lives in [place], [place] with husband, daughter [name], and her granddaughter. Has another daughter [name], three grandchildren, and a brother nearby. Married for 50 years. Retired. 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     History of the Present Illness: [name] is a pleasant 76 y.o. female who  has a past medical history of Alcoholism (CMS-HCC), Alcoholism /alcohol abuse, Cirrhosis (CMS-HCC), Depression, Hypertension, Liver disease, and Malignant neoplasm of overlapping sites of right breast in female, estrogen receptor positive (CMS-HCC) (10/03/2020). She is seen in consultation at the request of Dr. Ruthy Dick [facility]* for evaluation of breast cancer. She had an abnormal screening MMG 06/20/20. She had diagnostic MMG/US of right breast 07/23/20 with 0.7 x 0.6 x 0.6 cm irregular hypoechoic nonparallel mass with indistinct margins and peripheral vascularity at the 5:00 position approximately 8 CFN. Core bx of right breast 07/30/20 with IDC, G2, ER+(100%), PR+(100%), and  HER-2 negative (2+ by IHC, FISH not amplified). She underwent right partial mastectomy 09/03/20. Final path: IDC, 1.7 cm in greatest dimension, G2, associated DCIS. Negative margins. 1/1 LN positive for carcinoma.     Oncology History Overview Note   Oncotype?     Malignant neoplasm of overlapping sites of right breast in female, estrogen receptor positive (CMS-HCC)   07/30/2020 Biopsy    A: Breast, right, core biopsy  - Invasive ductal carcinoma (see comment)  - Nottingham combined histologic grade: 2               Tubule formation: 3               Nuclear grade: 2  Mitotic score: 1  - Invasive carcinoma measures approximately 3.5 mm in this specimen  - Solid papillary ductal carcinoma in situ  - Ancillary studies (see biomarker synoptic)               Estrogen receptor: Positive (100%)               Progesterone receptor: Positive (100%)               HER2 IHC: Equivocal (2+)  HER2 FISH: Insufficient cellularity     09/03/2020 Surgery    A.  Breast, right, partial mastectomy  - Invasive ductal carcinoma (see synoptic report)  - Tumor size: 17 mm in greatest dimension  - Ductal carcinoma in situ (DCIS), grade 2, solid papillary and cribriform types  - Size extent of DCIS: At least 55 mm   - Margin status for specimen A (see extended margins below)          Invasive carcinoma: Negative, <1 mm from anterior and inferior margins          Ductal carcinoma in situ: Negative, <1 mm from superior margin  - Ancillary studies reported on prior core biopsy [id number]          Estrogen receptor: Positive (100%)          Progesterone receptor: Positive (100%)          HER2 IHC: Equivocal (2+)          HER2 FISH: Insufficient cellularity (see comment)     B.  Breast, right, superior margin,excision  - Single microscopic focus of ductal carcinoma in situ  - Margin: Negative, <1 mm    C.  Breast, right, lateral margin, excision  - Negative for carcinoma    D.  Breast, right,  inferior margin, excision  - Negative for carcinoma    E.  Breast, right, medial margin, excision  - Negative for carcinoma     F. Right axilla, sentinel lymph node, biopsy  - One lymph node positive for carcinoma (1/1)     09/03/2020 -  Cancer Staged    Staging form: Breast, AJCC 8th Edition  - Pathologic stage from 09/03/2020: Stage IA (pT1c, pN1a, cM0, G2, ER+, PR+, HER2-, Oncotype DX score: 8) - Signed by Marcy Panning, FNP on 11/06/2021       11/08/2020 -  Radiation    Radiation Therapy Treatment Details (Noted on 11/08/2020)  Site: Right Breast  Technique: 3D CRT  Goal: No goal specified  Planned Treatment Start Date: No planned start date specified         Patient Active Problem List   Diagnosis    Alcoholic liver disease (CMS-HCC)    Hypokalemia    HTN (hypertension)    Chronic maxillary sinusitis  Alcoholism (CMS-HCC)    Encephalopathy    Depression with anxiety    Morbid (severe) obesity due to excess calories (CMS-HCC)    Macrocytosis    DJD (degenerative joint disease)    Malignant neoplasm of overlapping sites of right breast in female, estrogen receptor positive (CMS-HCC)    Liver lesion       Past Medical History:   Diagnosis Date    Alcoholism (CMS-HCC)     Alcoholism /alcohol abuse     Cirrhosis (CMS-HCC)     Depression     Hypertension     Liver disease     Malignant neoplasm of overlapping sites of right breast in female, estrogen receptor positive (CMS-HCC) 10/03/2020       Past Surgical History:   Procedure Laterality Date    BREAST BIOPSY Right     benign-a long time ago    BREAST LUMPECTOMY Right     4 2022    CHOLECYSTECTOMY      PR BX/REMV,LYMPH NODE,DEEP AXILL Right 09/03/2020    Procedure: BX/EXC LYMPH NODE; OPEN, DEEP AXILRY NODE;  Surgeon: Aris Everts, MD;  Location: ASC [facility];  Service: Surgical Oncology Breast    PR INTRAOPERATIVE SENTINEL LYMPH NODE ID W DYE INJECTION Right 09/03/2020    Procedure: INTRAOPERATIVE IDENTIFICATION SENTINEL LYMPH NODE(S) INCLUDE INJECTION NON-RADIOACTIVE DYE, WHEN PERFORMED;  Surgeon: Aris Everts, MD;  Location: ASC OR [facility];  Service: Surgical Oncology Breast PR MASTECTOMY, PARTIAL Right 09/03/2020    Procedure: MASTECTOMY, PARTIAL (EG, LUMPECTOMY, TYLECTOMY, QUADRANTECTOMY, SEGMENTECTOMY);  Surgeon: Aris Everts, MD;  Location: ASC OR [facility];  Service: Surgical Oncology Breast    PR UPPER GI ENDOSCOPY,BIOPSY N/A 02/03/2018    Procedure: UGI ENDOSCOPY; WITH BIOPSY, SINGLE OR MULTIPLE;  Surgeon: Alfred Levins, MD;  Location: [facility] PROCED[facility];  Service: Gastroenterology    RADIATION Right     unsure finish date    TUBAL LIGATION       Gyn History: G2P2. Menopause in her 36s. Denies HRT.     Medications:  Current Outpatient Medications   Medication Sig Dispense Refill    acetaminophen (TYLENOL) 325 MG tablet Take 2 tablets (650 mg total) by mouth every six (6) hours as needed for pain.  0    amLODIPine (NORVASC) 5 MG tablet Take 1 tablet (5 mg total) by mouth daily. 30 tablet 11    anastrozole (ARIMIDEX) 1 mg tablet Take 1 tablet (1 mg total) by mouth daily. 90 tablet 3    calcium carbonate 1,500 mg (600 mg elem calcium) tablet Take 1 tablet (600 mg of elem calcium total) by mouth daily.      carvediloL (COREG) 3.125 MG tablet TAKE 1 TABLET(3.125 MG) BY MOUTH TWICE DAILY 180 tablet 3    cyanocobalamin 1000 MCG tablet Take 1 tablet (1,000 mcg total) by mouth daily.      escitalopram oxalate (LEXAPRO) 20 MG tablet Take 1 tablet (20 mg total) by mouth daily. TAKE 1 TABLET(20 MG) BY MOUTH DAILY 90 tablet 3    folic acid (FOLVITE) 1 MG tablet Take 1 tablet (1 mg total) by mouth daily. 90 tablet 1    lactulose (CHRONULAC) 10 gram/15 mL solution TAKE 15 ML BY MOUTH THREE TIMES DAILY, TAKE ENOUGH TO HAVE 3 BOWEL MOVEMENTS PER DAY 1350 mL 5    magnesium oxide (MAG-OX) 400 mg (241.3 mg elemental magnesium) tablet Take 1 tablet (400 mg total) by mouth Two (2) times a day. 180 tablet 1  potassium chloride 20 MEQ ER tablet Take 1 tablet (20 mEq total) by mouth Two (2) times a day. 180 tablet 1    vitamin E-268 mg, 400 UNIT, 268 mg (400 UNIT) capsule Take 1 capsule (268 mg total) by mouth daily. 2 tabs       No current facility-administered medications for this visit.       Allergies:  Allergies   Allergen Reactions    Sulfa (Sulfonamide Antibiotics) Anaphylaxis    Sulfur Anaphylaxis     Tolerated sulfur colloid injection without incident 09/03/2020.    Aspirin      thrombocytopenia       Family History: Cancer-related family history is negative for Breast cancer, Cancer, Colon cancer, and Ovarian cancer.    Social History:   Social History     Social History Narrative    Daughter fills med boxes weekly.      Physical Examination:  Vital Signs: BP 146/63  - Pulse 76  - Temp 36.8 ??C (98.2 ??F) (Temporal)  - Resp 16  - Wt (!) 105.8 kg (233 lb 3.2 oz)  - SpO2 98%  - BMI 40.01 kg/m??   General: Healthy-appearing patient in no acute distress.Marland Kitchen  HEENT: PERRTLA, EOMI, OP clear  Cardiovascular: Normal S1 and S2. RRR. No audible murmurs.  Respiratory: Chest clear to percussion and auscultation.   Gastrointestinal: Abdomen soft without masses and tenderness, no hepatosplenomegaly or other masses.   Musculoskeletal: No bony pain or tenderness.   Skin: Abnormal hyperpigmented keratosis of lower right back. Two macules, not concerning.  Breasts: (R) breast: Benign. Well healed incision in UOQ. Healed incision in inferior aspect extending down to the inframammary fold. (L) breast: benign. No axillary adenopathy bilat.   Neurologic:  Alert and oriented. Grossly non-focal  Lymphatic: No cervical, axillary or supraclavicular adenopathy.   Extremity: No lower extremity edema or upper extremity lymphedema    DATA REVIEW:  Laboratory: I personally reviewed the pertinent laboratory data.  Radiology: I personally reviewed the pertinent imaging.  Pathology: I personally reviewed the pathology report.

## 2022-06-03 NOTE — Unmapped (Signed)
Assessment/Plan:    [name] was seen today for follow-up.    Diagnoses and all orders for this visit:    Weight loss  -     TSH    Depression with anxiety    Portal hypertension (CMS-HCC)    Benign essential HTN    Alcoholic liver disease (CMS-HCC)    Morbid (severe) obesity due to excess calories (CMS-HCC)    Hypertension, unspecified type  -     Lipid Panel  -     Comprehensive Metabolic Panel    Macrocytosis  -     CBC      She has a history of essential hypertension, alcoholic liver disease( pt stopped drinking alcohol 10 yrs ago), depression/anxiety, obesity,  Breast cancer, macrocytosis with history of alcoholism and DJD.   - For HTN she takes Coreg 3.125 mg bid  and amlodipine 5 mg daily.  She was advised to  continue home BP monitoring  low dietary salt intake and exercise.   Her BMI is 39 and she struggles with her weight but she has lost 11 lbs in the past 4 months. .  She is advised on caloric restriction to promote weight loss.  She admits to over eating. TSH, creatinine and lipid panel were normal in 07/2021.  She is due for annual TSH, CMP and lipid panel today.     She has a history of alcoholism, alcoholic liver disease, macrocytosis  and encephalopathy.  She stopped drinking alcohol over 6 yrs ago and she no longer needs to take Lactulose since her mental status has remained.     Liver US done 03/2019 and results revealed cirrhotic liver morphology, unchanged lesion in the hepatic lobe likely representing a hepatic calcification and no hepatic lesions were identified. Repeat RUQ US done 08/2021 remained stable.  She was to schedule follow up appt with the liver clinic and she was seen in October/2020. LFT's done in 07/2021 were normal except for chronically elevated alk phos but improved at 132 and total bilirubin of 1.3. She was  referred back to [facility] GI[facility]liver clinic and she never proceeded with this appt.   She never made this appt. And she is reminded to do so.  CBC done in 2020 revealed H/H of 15/47 with MCV of 103 and normal WBC and platelet counts. She takes OTC Vit B12, Vit B 1 and folic acid. In addition, she takes Vit D, calcium supplements daily.  She is due for repeat CBC today.     Depression history: recent decrease in dose of lexapro to 1/2 of 20 mg daily and she is doing well on this dose.     She has a history of right sided breast cancer: s/p partial mastectomy in 2022. She is followed by [facility] breast oncology and last seen last month. Her last breast imaging was in 07/2021. She continues on anastrozole 1 mg daily for a total of 5 yrs. She has upcoming scheduled mammogram and oncology visits in the near future.         Return in about 5 months (around 11/08/2022), or 40 min AWV in 11/2022.    Subjective:     HPI  I last saw the pt in 10/2021 for CPE visit.   She is seen today for routine follow up visit.      She has a history of essential hypertension, alcoholic liver disease( pt stopped drinking alcohol 10 yrs ago), depression/anxiety, obesity,  Breast cancer, macrocytosis with history of alcoholism and DJD.   -  For HTN she takes Coreg 3.125 mg bid  and amlodipine 5 mg daily.  She was advised to  continue home BP monitoring  low dietary salt intake and exercise.   Her BMI is 39 and she struggles with her weight but she has lost 11 lbs in the past 4 months. .  She is advised on caloric restriction to promote weight loss.  She admits to over eating. TSH, creatinine and lipid panel were normal in 07/2021.  She is due for annual TSH, CMP and lipid panel today.     She has a history of alcoholism, alcoholic liver disease, macrocytosis  and encephalopathy.  She stopped drinking alcohol over 6 yrs ago and she no longer needs to take Lactulose since her mental status has remained.     Liver US done 03/2019 and results revealed cirrhotic liver morphology, unchanged lesion in the hepatic lobe likely representing a hepatic calcification and no hepatic lesions were identified. Repeat RUQ US done 08/2021 remained stable.  She was to schedule follow up appt with the liver clinic and she was seen in October/2020. LFT's done in 07/2021 were normal except for chronically elevated alk phos but improved at 132 and total bilirubin of 1.3. She was  referred back to [facility] GI/liver clinic and she never proceeded with this appt.   She never made this appt. And she is reminded to do so.  CBC done in 2020 revealed H/H of 15/47 with MCV of 103 and normal WBC and platelet counts. She takes OTC Vit B12, Vit B 1 and folic acid. In addition, she takes Vit D, calcium supplements daily.  She is due for repeat CBC today.     Depression history: recent decrease in dose of lexapro to 1/2 of 20 mg daily and she is doing well on this dose.     She has a history of right sided breast cancer: s/p partial mastectomy in 2022. She is followed by [facility] breast oncology and last seen last month. Her last breast imaging was in 07/2021. She continues on anastrozole 1 mg daily for a total of 5 yrs. She has upcoming scheduled mammogram and oncology visits in the near future.       ROS  Constitutional:  Denies  unexpected weight loss or gain, or weakness   Eyes:  Denies visual changes  Respiratory:  Denies cough or shortness of breath. No change in exercise  tolerance  Cardiovascular:  Denies chest pain, palpitations or lower extremity swelling   GI:  Denies abdominal pain, diarrhea, constipation   Musculoskeletal:  Denies myalgias  Skin:  Denies nonhealing lesions  Neurologic:  Denies headache, focal weakness or numbness, tingling  Endocrine:  Denies polyuria or polydypsia   Psychiatric:  Denies depression, anxiety      Outpatient Medications Prior to Visit   Medication Sig Dispense Refill    acetaminophen (TYLENOL) 325 MG tablet Take 2 tablets (650 mg total) by mouth every six (6) hours as needed for pain.  0    amLODIPine (NORVASC) 5 MG tablet Take 1 tablet (5 mg total) by mouth daily. 30 tablet 11    anastrozole (ARIMIDEX) 1 mg tablet Take 1 tablet (1 mg total) by mouth daily. 90 tablet 3    calcium carbonate 1,500 mg (600 mg elem calcium) tablet Take 1 tablet (600 mg of elem calcium total) by mouth daily.      carvediloL (COREG) 3.125 MG tablet TAKE 1 TABLET(3.125 MG) BY MOUTH TWICE DAILY 180 tablet 3  cyanocobalamin 1000 MCG tablet Take 1 tablet (1,000 mcg total) by mouth daily.      escitalopram oxalate (LEXAPRO) 20 MG tablet Take 1 tablet (20 mg total) by mouth daily. TAKE 1 TABLET(20 MG) BY MOUTH DAILY (Patient taking differently: Take 1 tablet (20 mg total) by mouth daily. Take 1/2 a tab daily) 90 tablet 3    folic acid (FOLVITE) 1 MG tablet Take 1 tablet (1 mg total) by mouth daily. 90 tablet 1    lactulose (CHRONULAC) 10 gram/15 mL solution TAKE 15 ML BY MOUTH THREE TIMES DAILY, TAKE ENOUGH TO HAVE 3 BOWEL MOVEMENTS PER DAY (Patient taking differently: TAKE 15 ML BY MOUTH THREE TIMES DAILY, TAKE ENOUGH TO HAVE 3 BOWEL MOVEMENTS PER DAY- she takes this prn) 1350 mL 5    magnesium oxide (MAG-OX) 400 mg (241.3 mg elemental magnesium) tablet Take 1 tablet (400 mg total) by mouth Two (2) times a day. 180 tablet 1    potassium chloride 20 MEQ ER tablet Take 1 tablet (20 mEq total) by mouth Two (2) times a day. 180 tablet 1    vitamin E-268 mg, 400 UNIT, 268 mg (400 UNIT) capsule Take 1 capsule (268 mg total) by mouth daily. 2 tabs       No facility-administered medications prior to visit.         Objective:       Vital Signs  BP 124/70  - Pulse 76  - Temp 36.9 ??C (98.5 ??F)  - Ht 162.6 cm (5' 4)  - Wt (!) 103.2 kg (227 lb 8 oz)  - SpO2 98%  - BMI 39.05 kg/m??      Exam  General: normal appearance  EYES: Anicteric sclerae.  ENT: Oropharynx moist.  RESP: Relaxed respiratory effort. Clear to auscultation without wheezes or crackles.   CV: Regular rate and rhythm. Normal S1 and S2. No murmurs or gallops.  No lower extremity edema. Posterior tibial pulses are 2+ and symmetric.  abd exam: non tender, no masses, no HSM   MSK: No focal muscle tenderness.  SKIN: Appropriately warm and moist.  NEURO: Stable gait and coordination.    Allergies:     Sulfa (sulfonamide antibiotics), Sulfur, and Aspirin    Current Medications:     Current Outpatient Medications   Medication Sig Dispense Refill    acetaminophen (TYLENOL) 325 MG tablet Take 2 tablets (650 mg total) by mouth every six (6) hours as needed for pain.  0    amLODIPine (NORVASC) 5 MG tablet Take 1 tablet (5 mg total) by mouth daily. 30 tablet 11    anastrozole (ARIMIDEX) 1 mg tablet Take 1 tablet (1 mg total) by mouth daily. 90 tablet 3    calcium carbonate 1,500 mg (600 mg elem calcium) tablet Take 1 tablet (600 mg of elem calcium total) by mouth daily.      carvediloL (COREG) 3.125 MG tablet TAKE 1 TABLET(3.125 MG) BY MOUTH TWICE DAILY 180 tablet 3    cyanocobalamin 1000 MCG tablet Take 1 tablet (1,000 mcg total) by mouth daily.      escitalopram oxalate (LEXAPRO) 20 MG tablet Take 1 tablet (20 mg total) by mouth daily. TAKE 1 TABLET(20 MG) BY MOUTH DAILY (Patient taking differently: Take 1 tablet (20 mg total) by mouth daily. Take 1/2 a tab daily) 90 tablet 3    folic acid (FOLVITE) 1 MG tablet Take 1 tablet (1 mg total) by mouth daily. 90 tablet 1    lactulose (CHRONULAC) 10 gram/15  mL solution TAKE 15 ML BY MOUTH THREE TIMES DAILY, TAKE ENOUGH TO HAVE 3 BOWEL MOVEMENTS PER DAY (Patient taking differently: TAKE 15 ML BY MOUTH THREE TIMES DAILY, TAKE ENOUGH TO HAVE 3 BOWEL MOVEMENTS PER DAY- she takes this prn) 1350 mL 5    magnesium oxide (MAG-OX) 400 mg (241.3 mg elemental magnesium) tablet Take 1 tablet (400 mg total) by mouth Two (2) times a day. 180 tablet 1    potassium chloride 20 MEQ ER tablet Take 1 tablet (20 mEq total) by mouth Two (2) times a day. 180 tablet 1    vitamin E-268 mg, 400 UNIT, 268 mg (400 UNIT) capsule Take 1 capsule (268 mg total) by mouth daily. 2 tabs       No current facility-administered medications for this visit.           Note - This record has been created using [employer]. Chart creation errors have been sought, but may not always have been located. Such creation errors do not reflect on the standard of medical care.    Jenell Milliner, MD

## 2022-06-09 ENCOUNTER — Ambulatory Visit: Admit: 2022-06-09 | Discharge: 2022-06-10 | Payer: MEDICARE

## 2022-06-09 DIAGNOSIS — D7589 Other specified diseases of blood and blood-forming organs: Principal | ICD-10-CM

## 2022-06-09 DIAGNOSIS — I10 Essential (primary) hypertension: Principal | ICD-10-CM

## 2022-06-09 DIAGNOSIS — F418 Other specified anxiety disorders: Principal | ICD-10-CM

## 2022-06-09 DIAGNOSIS — K766 Portal hypertension: Principal | ICD-10-CM

## 2022-06-09 DIAGNOSIS — K709 Alcoholic liver disease, unspecified: Principal | ICD-10-CM

## 2022-06-09 DIAGNOSIS — R634 Abnormal weight loss: Principal | ICD-10-CM

## 2022-06-09 LAB — LIPID PANEL
CHOLESTEROL/HDL RATIO SCREEN: 2.8 (ref 1.0–4.5)
CHOLESTEROL: 181 mg/dL (ref <=200)
HDL CHOLESTEROL: 64 mg/dL — ABNORMAL HIGH (ref 40–60)
LDL CHOLESTEROL CALCULATED: 98 mg/dL (ref 40–99)
NON-HDL CHOLESTEROL: 117 mg/dL (ref 70–130)
TRIGLYCERIDES: 97 mg/dL (ref 0–150)
VLDL CHOLESTEROL CAL: 19.4 mg/dL (ref 11–41)

## 2022-06-09 LAB — COMPREHENSIVE METABOLIC PANEL
ALBUMIN: 3.4 g/dL (ref 3.4–5.0)
ALKALINE PHOSPHATASE: 158 U/L — ABNORMAL HIGH (ref 46–116)
ALT (SGPT): 13 U/L (ref 10–49)
ANION GAP: 6 mmol/L (ref 5–14)
AST (SGOT): 33 U/L (ref <=34)
BILIRUBIN TOTAL: 1.5 mg/dL — ABNORMAL HIGH (ref 0.3–1.2)
BLOOD UREA NITROGEN: 6 mg/dL — ABNORMAL LOW (ref 9–23)
BUN / CREAT RATIO: 12
CALCIUM: 9.4 mg/dL (ref 8.7–10.4)
CHLORIDE: 113 mmol/L — ABNORMAL HIGH (ref 98–107)
CO2: 25 mmol/L (ref 20.0–31.0)
CREATININE: 0.5 mg/dL — ABNORMAL LOW
EGFR CKD-EPI (2021) FEMALE: >90 mL/min/{1.73_m2} (ref >=60)
GLUCOSE RANDOM: 103 mg/dL (ref 70–179)
POTASSIUM: 4.1 mmol/L (ref 3.4–4.8)
PROTEIN TOTAL: 6.8 g/dL (ref 5.7–8.2)
SODIUM: 144 mmol/L (ref 135–145)

## 2022-06-09 LAB — CBC
HEMATOCRIT: 38.4 % (ref 34.0–44.0)
HEMOGLOBIN: 13.6 g/dL (ref 11.3–14.9)
MEAN CORPUSCULAR HEMOGLOBIN CONC: 35.3 g/dL (ref 32.0–36.0)
MEAN CORPUSCULAR HEMOGLOBIN: 34.2 pg — ABNORMAL HIGH (ref 25.9–32.4)
MEAN CORPUSCULAR VOLUME: 96.8 fL — ABNORMAL HIGH (ref 77.6–95.7)
MEAN PLATELET VOLUME: 7.6 fL (ref 6.8–10.7)
PLATELET COUNT: 169 10*9/L (ref 150–450)
RED BLOOD CELL COUNT: 3.96 10*12/L (ref 3.95–5.13)
RED CELL DISTRIBUTION WIDTH: 13.6 % (ref 12.2–15.2)
WBC ADJUSTED: 6.1 10*9/L (ref 3.6–11.2)

## 2022-06-09 LAB — TSH: THYROID STIMULATING HORMONE: 4.131 u[IU]/mL (ref 0.550–4.780)

## 2022-06-09 NOTE — Unmapped (Signed)
Call [facility] liver clinic to schedule follow up visit.  Referral was submitted in 10/2021.

## 2022-06-09 NOTE — Unmapped (Signed)
CBC is essentially normal with improvement of MCV at 96.

## 2022-06-10 NOTE — Unmapped (Signed)
Lipid panel and TSH are  normal. CMP reveals chronically elevated total bilirubin and alk phos level.  She has RUQ [place] in 07/2021 which revealed the following:  1.Heterogenous liver, likely representing chronic liver disease.  2.Stable 1.6 cm hyperechoic lesion in the right hepatic lobe which appears grossly unchanged since at least 06/04/2017 ultrasound and without MRI correlate. Finding favored to represent benign calcification.  I will continue to monitor for worsening elevations.

## 2022-07-17 ENCOUNTER — Ambulatory Visit: Admit: 2022-07-17 | Payer: MEDICARE | Attending: Radiation Oncology | Primary: Radiation Oncology

## 2022-07-17 ENCOUNTER — Ambulatory Visit: Admit: 2022-07-17 | Discharge: 2022-07-18 | Payer: MEDICARE

## 2022-07-17 DIAGNOSIS — Z170 Estrogen receptor positive status [ER+]: Principal | ICD-10-CM

## 2022-07-17 DIAGNOSIS — C50811 Malignant neoplasm of overlapping sites of right female breast: Principal | ICD-10-CM

## 2022-07-17 NOTE — Unmapped (Unsigned)
Patient here for follow up today.     Doing well overall.     ROM: no issues    Pain: no pain    Energy level: decreased, sleeps a lot    Bowels: No issues

## 2022-07-17 NOTE — Unmapped (Signed)
RADIATION ONCOLOGY FOLLOW-UP VISIT NOTE     Encounter Date: 07/17/2022  Patient Name: [name]  Medical Record Number: [id number]    DIAGNOSIS:  [name] is a 76yo with a pT1cN1 R breast IDC (ER/PR pos, HER2 neg) s/p partial mastectomy/SLN (1.7cm, grade 2, no LVI, 1/1 SLN involved).  She received adjuvant radiation to breast/regional LN (60Gy finished 01/09/2021)    DURATION SINCE COMPLETION OF RADIOTHERAPY:  1 year, 6 months (01/09/2021)    ASSESSMENT:  Disease Status: No evidence of disease on mammogram or exam today    RECOMMENDATIONS:  FOLLOW-UP:  Med onc in 3 months.  I'll tentatively see back in 1 year with mammogram  Skin:  She had significant skin reaction during treatment but has healed well- mild hyperpigmentation but no other issues  Endocrine:  Continue anastrazole  Surveillance:  Mammo due 07/2023- ordered today    INTERVAL HISTORY:  Overall doing well today without any major issues from before.  No breast-specific problems- no pain, no masses, no skin changes.  No breast/arm swelling.  No ROM limitations.  Energy is down a little but acknowledges she's not very active at baseline.  Mostly sedentary and does some minimal walking around the house/outside.  She is tolerating anastrazole well overall- some mild hot flashes.  Otherwise no major changes from before- here with her daughter.    REVIEW OF SYSTEMS:  A comprehensive review of 10 systems was negative except for pertinent positives noted in HPI.    PAST MEDICAL HISTORY/FAMILY HISTORY/SOCIAL HISTORY:  Reviewed in EPIC    ALLERGIES/MEDICATIONS:  Reviewed in EPIC    PHYSICAL EXAM:  Vital Signs for this encounter:   There were no vitals taken for this visit.  Karnofsky/Lansky Performance Status: 60, Requires occasional assistance, but is able to care for most of his personal needs (ECOG equivalent 2)  General:   No acute distress, alert and oriented X 4   Head: Normocephalic, without obvious abnormality, atraumatic   Eyes: EOMI, no scleral icterus  Lungs:Normal work of breathing  Heart: RRR, S1/S2 normal, no murmur/rub/gallop  Extremities: Extremities normal, atraumatic.  No edema in bilateral upper extremities.  Lymph nodes: No palpable axillary or supraclavicular lymphadenopathy  Neurologic: Grossly normal  Breast:  Bilateral breast exam performed.  Mild hyperpigmentation over R breast, but no palpable mass in the R breast.  No palpable mass in the L breast, no overlying skin changes    RADIOLOGY:  Mammo 07/17/2022  At the lumpectomy site in the right breast, there is expected density and architectural distortion. There are no suspicious calcifications, dominant masses or unexplained areas of architectural distortion in either breast. There is no mammographic evidence of malignancy. No significant change from prior studies.  BIRADS 2     Labs:    No results found for: WBC, HGB, HCT, PLT, LDH, CREATININE, AST, ALT, MG    Rayetta Humphrey, MD  Assistant Professor  [facility] Dept of Radiation Oncology  07/17/2022

## 2022-07-21 DIAGNOSIS — C50811 Malignant neoplasm of overlapping sites of right female breast: Principal | ICD-10-CM

## 2022-07-21 DIAGNOSIS — Z170 Estrogen receptor positive status [ER+]: Principal | ICD-10-CM

## 2022-08-06 MED ORDER — FOLIC ACID 1 MG TABLET
ORAL_TABLET | Freq: Every day | ORAL | 1 refills | 90 days | Status: CP
Start: 2022-08-06 — End: 2023-08-06

## 2022-08-10 DIAGNOSIS — I10 Essential (primary) hypertension: Principal | ICD-10-CM

## 2022-08-10 MED ORDER — AMLODIPINE 5 MG TABLET
ORAL_TABLET | Freq: Every day | ORAL | 11 refills | 0 days
Start: 2022-08-10 — End: ?

## 2022-08-10 MED ORDER — POTASSIUM CHLORIDE ER 20 MEQ TABLET,EXTENDED RELEASE(PART/CRYST)
ORAL_TABLET | 1 refills | 0 days
Start: 2022-08-10 — End: ?

## 2022-08-11 MED ORDER — AMLODIPINE 5 MG TABLET
ORAL_TABLET | Freq: Every day | ORAL | 1 refills | 90 days | Status: CP
Start: 2022-08-11 — End: 2023-08-11

## 2022-08-11 MED ORDER — POTASSIUM CHLORIDE ER 20 MEQ TABLET,EXTENDED RELEASE(PART/CRYST)
ORAL_TABLET | Freq: Every day | ORAL | 1 refills | 180 days | Status: CP
Start: 2022-08-11 — End: 2023-08-11

## 2022-08-15 MED ORDER — MAGNESIUM OXIDE 400 MG (241.3 MG MAGNESIUM) TABLET
ORAL_TABLET | 1 refills | 0 days
Start: 2022-08-15 — End: ?

## 2022-08-15 NOTE — Unmapped (Signed)
Patient is requesting the following refill  Requested Prescriptions     Pending Prescriptions Disp Refills    magnesium oxide (MAG-OX) 400 mg (241.3 mg elemental magnesium) tablet [Pharmacy Med Name: MAG-OXIDE 400MG  TABLETS] 180 tablet 1     Sig: TAKE 1 TABLET(400 MG) BY MOUTH TWICE DAILY       Recent Visits  Date Type Provider Dept   06/09/22 Office Visit Jenell Milliner, MD [facility] [address] [facility]   11/28/21 Office Visit Jenell Milliner, MD [facility] [address] At [facility]   08/20/21 Office Visit Jenell Milliner, MD [facility]   Showing recent visits within past 365 days with a meds authorizing provider and meeting all other requirements  Future Appointments  Date Type Provider Dept   11/11/22 Appointment Jenell Milliner, MD [facility] [address] At [facility]   Showing future appointments within next 365 days with a meds authorizing provider and meeting all other requirements

## 2022-08-17 MED ORDER — MAGNESIUM OXIDE 400 MG (241.3 MG MAGNESIUM) TABLET
ORAL_TABLET | 1 refills | 0 days | Status: CP
Start: 2022-08-17 — End: ?

## 2022-10-20 ENCOUNTER — Emergency Department
Admit: 2022-10-20 | Discharge: 2022-10-20 | Disposition: A | Discharge status: Home / Self Care | Payer: MEDICARE | Attending: Emergency Medicine

## 2022-10-20 ENCOUNTER — Ambulatory Visit
Admit: 2022-10-20 | Discharge: 2022-10-20 | Disposition: A | Discharge status: Home / Self Care | Payer: MEDICARE | Attending: Emergency Medicine

## 2022-10-20 DIAGNOSIS — M545 Acute midline low back pain without sciatica: Principal | ICD-10-CM

## 2022-10-20 DIAGNOSIS — E876 Hypokalemia: Principal | ICD-10-CM

## 2022-10-20 DIAGNOSIS — S20212A Contusion of left front wall of thorax, initial encounter: Principal | ICD-10-CM

## 2022-10-20 DIAGNOSIS — S8011XA Contusion of right lower leg, initial encounter: Principal | ICD-10-CM

## 2022-10-20 DIAGNOSIS — S0990XA Unspecified injury of head, initial encounter: Principal | ICD-10-CM

## 2022-10-20 LAB — COMPREHENSIVE METABOLIC PANEL
ALBUMIN: 3.1 g/dL — ABNORMAL LOW (ref 3.4–5.0)
ALKALINE PHOSPHATASE: 164 U/L — ABNORMAL HIGH (ref 46–116)
ALT (SGPT): 8 U/L — ABNORMAL LOW (ref 10–49)
ANION GAP: 6 mmol/L (ref 5–14)
AST (SGOT): 26 U/L (ref <=34)
BILIRUBIN TOTAL: 2 mg/dL — ABNORMAL HIGH (ref 0.3–1.2)
BLOOD UREA NITROGEN: 9 mg/dL (ref 9–23)
BUN / CREAT RATIO: 17
CALCIUM: 9.3 mg/dL (ref 8.7–10.4)
CHLORIDE: 109 mmol/L — ABNORMAL HIGH (ref 98–107)
CO2: 28.9 mmol/L (ref 20.0–31.0)
CREATININE: 0.52 mg/dL — ABNORMAL LOW
EGFR CKD-EPI (2021) FEMALE: >90 mL/min/{1.73_m2} (ref >=60)
GLUCOSE RANDOM: 107 mg/dL (ref 70–179)
POTASSIUM: 2.9 mmol/L — ABNORMAL LOW (ref 3.4–4.8)
PROTEIN TOTAL: 6.5 g/dL (ref 5.7–8.2)
SODIUM: 144 mmol/L (ref 135–145)

## 2022-10-20 LAB — CBC W/ AUTO DIFF
BASOPHILS ABSOLUTE COUNT: 0.1 10*9/L (ref 0.0–0.1)
BASOPHILS RELATIVE PERCENT: 0.9 %
EOSINOPHILS ABSOLUTE COUNT: 0.1 10*9/L (ref 0.0–0.5)
EOSINOPHILS RELATIVE PERCENT: 1.6 %
HEMATOCRIT: 37.2 % (ref 34.0–44.0)
HEMOGLOBIN: 13.2 g/dL (ref 11.3–14.9)
LYMPHOCYTES ABSOLUTE COUNT: 1.1 10*9/L (ref 1.1–3.6)
LYMPHOCYTES RELATIVE PERCENT: 14.9 %
MEAN CORPUSCULAR HEMOGLOBIN CONC: 35.4 g/dL (ref 32.0–36.0)
MEAN CORPUSCULAR HEMOGLOBIN: 34.1 pg — ABNORMAL HIGH (ref 25.9–32.4)
MEAN CORPUSCULAR VOLUME: 96.3 fL — ABNORMAL HIGH (ref 77.6–95.7)
MEAN PLATELET VOLUME: 7.3 fL (ref 6.8–10.7)
MONOCYTES ABSOLUTE COUNT: 0.9 10*9/L — ABNORMAL HIGH (ref 0.3–0.8)
MONOCYTES RELATIVE PERCENT: 12.5 %
NEUTROPHILS ABSOLUTE COUNT: 5.3 10*9/L (ref 1.8–7.8)
NEUTROPHILS RELATIVE PERCENT: 70.1 %
NUCLEATED RED BLOOD CELLS: 0 /100{WBCs} (ref <=4)
PLATELET COUNT: 137 10*9/L — ABNORMAL LOW (ref 150–450)
RED BLOOD CELL COUNT: 3.86 10*12/L — ABNORMAL LOW (ref 3.95–5.13)
RED CELL DISTRIBUTION WIDTH: 13.8 % (ref 12.2–15.2)
WBC ADJUSTED: 7.6 10*9/L (ref 3.6–11.2)

## 2022-10-20 LAB — URINALYSIS WITH MICROSCOPY WITH CULTURE REFLEX PERFORMABLE
BACTERIA: NONE SEEN /HPF
HYALINE CASTS: >182 /LPF — ABNORMAL HIGH (ref 0–1)
NITRITE UA: NEGATIVE
PH UA: 6 (ref 5.0–9.0)
PROTEIN UA: 50 — AB
RBC UA: 20 /HPF — ABNORMAL HIGH (ref <=4)
SPECIFIC GRAVITY UA: 1.025 (ref 1.003–1.030)
SQUAMOUS EPITHELIAL: 18 /HPF — ABNORMAL HIGH (ref 0–5)
UROBILINOGEN UA: 3 — AB
WBC UA: 36 /HPF — ABNORMAL HIGH (ref 0–5)

## 2022-10-20 LAB — HIGH SENSITIVITY TROPONIN I - SINGLE: HIGH SENSITIVITY TROPONIN I: 11 ng/L (ref <=34)

## 2022-10-20 MED ORDER — OXYCODONE 5 MG TABLET
ORAL_TABLET | ORAL | 0 refills | 2 days | Status: CP | PRN
Start: 2022-10-20 — End: 2022-10-25

## 2022-10-20 MED ADMIN — potassium chloride ER tablet 40 mEq: 40 meq | ORAL | @ 19:00:00 | Stop: 2022-10-20

## 2022-10-20 MED ADMIN — iohexol (OMNIPAQUE) 350 mg iodine/mL solution 100 mL: 100 mL | INTRAVENOUS | @ 18:00:00 | Stop: 2022-10-20

## 2022-10-20 NOTE — Unmapped (Signed)
Pt & family reports falling down approx. 7 stairs while going up to bed.  States she was reaching for something and fell backwards.  Hit her head, bruise present.  ? LOC.  Family heard the fall.  Bruising to her left chest & neck area.  Pt reports left arm, shoulder & back pain,. Pt A&O x4.

## 2022-10-20 NOTE — Unmapped (Signed)
Patients daughter  is requesting for the Nurse to contact them in regards to patients prescription for potassium. Daughter says patient was taking it twice a day for years but the last prescription was sent over as once a day. Patient is now out and has been for 2 weeks because she has been taking twice a day. Daughter wants to know if it was supposed to be changed to once a day or if that was a mistake. She would also like it to be refilled since patient is out.  Please contact  Aram Beecham  by [employer]

## 2022-10-20 NOTE — Unmapped (Signed)
[facility]  Emergency Department Provider Note      ED Clinical Impression      Final diagnoses:   Closed head injury, initial encounter (Primary)   Contusion of left chest wall, initial encounter   Traumatic ecchymosis of multiple sites of right lower extremity, initial encounter   Acute midline low back pain without sciatica   Hypokalemia          Impression, Medical Decision Making, Progress Notes and Critical Care      Impression, Differential Diagnosis and Plan of Care    [name] is a 77 y.o. female with a PMH of breast cancer (s/p partial mastectomy and radiation), hypertension, alcoholic liver disease who presents with left shoulder pain, low back pain, and right knee pain after she slipped and fell down 6-7 stairs 4 days ago while walking up stairs in her home. Positive head strike. No LOC.     Vital signs stable, no noted tachycardia or tachypnea. On exam, there is bruising and swelling to the left chest. TTP over the first rib on the left side. Significant bruising and ecchymosis to right leg. Posterior occiput bruising and swelling. TTP over the low back.    Differential includes rib fracture versus chest wall contusion versus shoulder fracture versus knee fracture versus SAH versus closed head injury.    Plan to obtain EKG, CTA chest, CT CTL spine, CT A/P, CT head, XR R knee, UA, basic labs, hsTrop.     Independent Interpretation of Studies: EKG shows normal sinus rhythm with PVCs at 73, possible LVH, no ST or T wave changes concerning for acute ischemia.. I have independently reviewed CT imaging and note soft tissue bruising, no other significant traumatic injuries.. I have independently reviewed XR R knee and note no acute fracture..  External Records Reviewed: Patient's most recent outpatient clinic note (Fam Med Note 06/09/22 for history)  History obtained from other sources: Family    Additional Progress Notes    EKG is unremarkable.  CT cervical spine shows questionable lucency involving the left lateral aspect of the C2 vertebral body.  On repeat exam, patient has no midline cervical spine tenderness over this area, lowering clinical suspicion for fracture.  Remainder of imaging does not show significant traumatic abnormality.  Hemoglobin at baseline, low suspicion for clinically significant bleed.  Patient's potassium is 2.9, this was repleted.  Daughter says that patient is usually on potassium supplement but that insurance company did not cover it so they have not been taking it for the last several days.  Patient was also noted to have a bilirubin of 2, similar to prior and compatible with known history of alcoholic liver disease.    Overall, patient appears well and is able to ambulate.  Will discharge home with PCP follow-up, return precautions given.    Portions of this record have been created using Scientist, clinical (histocompatibility and immunogenetics). Dictation errors have been sought, but may not have been identified and corrected.    See chart and resident provider documentation for details.    ____________________________________________         History        Reason for Visit  Fall      HPI   [name] is a 77 y.o. female with a PMH of breast cancer (s/p partial mastectomy and radiation), hypertension, alcoholic liver disease who presents to the ED for evaluation after a fall. Patient reports that she fell down 6-7 stairs 4 days ago while  walking up stairs in her home to go to bed. No LOC. The patient states that she was reaching for an object on the stairs and slipped backwards. She struck the back of her head on the ground during this incident.  Denies loss of consciousness.  She has since experienced right knee pain, lower back pain, and left shoulder pain. She has been able to ambulate at her baseline since the fall occurred. She is not anticoagulated. Of note, her daughter states that the patient has seemed more confused since the fall. She denies shortness of breath or hematuria.    Past Medical History:   Diagnosis Date    Alcoholism (CMS-HCC)     Alcoholism /alcohol abuse     Cirrhosis (CMS-HCC)     Depression     Hypertension     Liver disease     Malignant neoplasm of overlapping sites of right breast in female, estrogen receptor positive (CMS-HCC) 10/03/2020       Patient Active Problem List   Diagnosis    Alcoholic liver disease (CMS-HCC)    Hypokalemia    HTN (hypertension)    Chronic maxillary sinusitis    Alcoholism (CMS-HCC)    Encephalopathy    Depression with anxiety    Morbid (severe) obesity due to excess calories (CMS-HCC)    Macrocytosis    DJD (degenerative joint disease)    Malignant neoplasm of overlapping sites of right breast in female, estrogen receptor positive (CMS-HCC)    Liver lesion       Past Surgical History:   Procedure Laterality Date    BREAST BIOPSY Right     benign-a long time ago    BREAST BIOPSY Right 07/2020    malignant    BREAST LUMPECTOMY Right     4 2022    CHOLECYSTECTOMY      PR BX/REMV,LYMPH NODE,DEEP AXILL Right 09/03/2020    Procedure: BX/EXC LYMPH NODE; OPEN, DEEP AXILRY NODE;  Surgeon: Aris Everts, MD;  Location: [facility];  Service: Surgical Oncology Breast    PR INTRAOPERATIVE SENTINEL LYMPH NODE ID W DYE INJECTION Right 09/03/2020    Procedure: INTRAOPERATIVE IDENTIFICATION SENTINEL LYMPH NODE(S) INCLUDE INJECTION NON-RADIOACTIVE DYE, WHEN PERFORMED;  Surgeon: Aris Everts, MD;  Location: [facility];  Service: Surgical Oncology Breast    PR MASTECTOMY, PARTIAL Right 09/03/2020    Procedure: MASTECTOMY, PARTIAL (EG, LUMPECTOMY, TYLECTOMY, QUADRANTECTOMY, SEGMENTECTOMY);  Surgeon: Aris Everts, MD;  Location: [facility];  Service: Surgical Oncology Breast    PR UPPER GI ENDOSCOPY,BIOPSY N/A 02/03/2018    Procedure: UGI ENDOSCOPY; WITH BIOPSY, SINGLE OR MULTIPLE;  Surgeon: Alfred Levins, MD;  [facility]: [facility];  Service: Gastroenterology    RADIATION Right     unsure finish date TUBAL LIGATION         No current facility-administered medications for this encounter.    Current Outpatient Medications:     acetaminophen (TYLENOL) 325 MG tablet, Take 2 tablets (650 mg total) by mouth every six (6) hours as needed for pain., Disp: , Rfl: 0    amlodipine (NORVASC) 5 MG tablet, Take 1 tablet (5 mg total) by mouth daily. TAKE 1 TABLET(5 MG) BY MOUTH DAILY, Disp: 90 tablet, Rfl: 1    anastrozole (ARIMIDEX) 1 mg tablet, Take 1 tablet (1 mg total) by mouth daily., Disp: 90 tablet, Rfl: 3    calcium carbonate 1,500 mg (600 mg elem calcium) tablet, Take 1 tablet (600 mg of elem  calcium total) by mouth daily., Disp: , Rfl:     carvediloL (COREG) 3.125 MG tablet, TAKE 1 TABLET(3.125 MG) BY MOUTH TWICE DAILY, Disp: 180 tablet, Rfl: 3    cyanocobalamin 1000 MCG tablet, Take 1 tablet (1,000 mcg total) by mouth daily., Disp: , Rfl:     escitalopram oxalate (LEXAPRO) 20 MG tablet, Take 1 tablet (20 mg total) by mouth daily. TAKE 1 TABLET(20 MG) BY MOUTH DAILY (Patient taking differently: Take 1 tablet (20 mg total) by mouth daily. Take 1/2 a tab daily), Disp: 90 tablet, Rfl: 3    folic acid (FOLVITE) 1 MG tablet, Take 1 tablet (1 mg total) by mouth daily., Disp: 90 tablet, Rfl: 1    lactulose (CHRONULAC) 10 gram/15 mL solution, TAKE 15 ML BY MOUTH THREE TIMES DAILY, TAKE ENOUGH TO HAVE 3 BOWEL MOVEMENTS PER DAY (Patient taking differently: TAKE 15 ML BY MOUTH THREE TIMES DAILY, TAKE ENOUGH TO HAVE 3 BOWEL MOVEMENTS PER DAY- she takes this prn), Disp: 1350 mL, Rfl: 5    magnesium oxide (MAG-OX) 400 mg (241.3 mg elemental magnesium) tablet, TAKE 1 TABLET(400 MG) BY MOUTH TWICE DAILY, Disp: 180 tablet, Rfl: 1    potassium chloride 20 MEQ ER tablet, Take 1 tablet (20 mEq total) by mouth daily., Disp: 180 tablet, Rfl: 1    vitamin E-268 mg, 400 UNIT, 268 mg (400 UNIT) capsule, Take 1 capsule (268 mg total) by mouth daily. 2 tabs, Disp: , Rfl:     Allergies  Sulfa (sulfonamide antibiotics), Sulfur, and Aspirin    Family History   Problem Relation Age of Onset    Lupus Brother     Heart disease Mother     No Known Problems Father     No Known Problems Sister     No Known Problems Daughter     No Known Problems Maternal Grandmother     No Known Problems Maternal Grandfather     No Known Problems Paternal Grandmother     No Known Problems Paternal Grandfather     BRCA 1/2 Neg Hx     Breast cancer Neg Hx     Cancer Neg Hx     Colon cancer Neg Hx     Endometrial cancer Neg Hx     Ovarian cancer Neg Hx     Mental illness Neg Hx     Substance Abuse Disorder Neg Hx        Social History  Social History     Tobacco Use    Smoking status: Never     Passive exposure: Past    Smokeless tobacco: Never   Vaping Use    Vaping status: Never Used   Substance Use Topics    Alcohol use: Not Currently    Drug use: Never        Physical Exam     This provider entered the patient's room: YES    If this provider did not enter the room, a comprehensive physical exam was not able to be performed due to increased infection risk to themselves, other providers, staff and other patients), as well as to conserve personal protective equipment (PPE) utilization during the COVID-19 pandemic.    If this provider did enter the patient room, the following was PPE worn: Surgical mask, eye protection and gloves     BP 158/69  - Pulse 74  - Resp 22  - Wt (!) 108 kg (238 lb)  - SpO2 96%  - BMI 40.85 kg/m??  Constitutional: Alert and oriented. Well appearing and in no distress.  Eyes: Conjunctivae are normal.  ENT       Head: Posterior occiput bruising and swelling. Normocephalic.       Nose: No congestion.       Mouth/Throat: Mucous membranes are moist.       Neck: No stridor.  Hematological/Lymphatic/Immunilogical: No cervical lymphadenopathy.  Cardiovascular: Normal rate, regular rhythm. Normal and symmetric distal pulses are present in all extremities.  Respiratory: Normal respiratory effort. Breath sounds are normal.  Gastrointestinal: Soft and nontender. There is no CVA tenderness.  Musculoskeletal: Bruising and swelling to the left chest. TTP over the first rib on the left side. Significant bruising and ecchymosis to right leg. TTP over the low back. Normal range of motion in all extremities.       Right lower leg: No tenderness or edema.       Left lower leg: No tenderness or edema.  Neurologic: Normal speech and language. No gross focal neurologic deficits are appreciated.  Skin: Skin is warm, dry and intact. No rash noted.  Psychiatric: Mood and affect are normal. Speech and behavior are normal.         Radiology     XR Shoulder 3 Or More Views Left   Final Result   Mild acromioclavicular hypertrophy. No acute findings.      CT Head Wo Contrast   Final Result   No acute intracranial abnormality.      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            CT Abdomen Pelvis W Contrast   Final Result   Probable bruising in the flanks and left lateral abdominal wall, as described in the body of the report. Correlate with physical examination.      Cirrhosis with left retroperitoneal varix/splenorenal shunt.      Nonobstructing left-sided nephrolithiasis.         CT Thoracic Spine Reformat W Contrast   Final Result   No acute injuries.         CT Lumbar Spine Reformat W Contrast   Final Result   No acute injuries.         CTA Chest W Contrast   Final Result      No acute traumatic abnormality of the chest.      Cardiomegaly with with left atrial chamber dilatation. Query mild interstitial pulmonary edema. Trace bilateral pleural effusions.      CT Cervical Spine Wo Contrast   Final Result      Questionable lucency involving the left lateral aspect of C2 vertebral body.               XR Knee 3 Views Right   Final Result   No acute fracture or malalignment of the right knee.      Moderate soft tissue edema about the leg.      Moderate to severe patellofemoral and moderate medial tibiofemoral compartment osteoarthrosis. Procedures     N/A    Documentation assistance was provided by Cherly Hensen, Scribe, on Oct 20, 2022 at 11:37 AM for Shaune Leeks, MD.    Oct 21, 2022 4:53 PM. Documentation assistance provided by the scribe. I was present during the time the encounter was recorded. The information recorded by the scribe was done at my direction and has been reviewed and validated by me.        Sherryl Barters, MD  10/21/22 [phone]

## 2022-10-21 MED ORDER — POTASSIUM CHLORIDE ER 20 MEQ TABLET,EXTENDED RELEASE(PART/CRYST)
ORAL_TABLET | Freq: Two times a day (BID) | ORAL | 1 refills | 90 days | Status: CP
Start: 2022-10-21 — End: 2023-10-21

## 2022-10-21 NOTE — Unmapped (Signed)
Addended by: Heron Sabins on: 10/21/2022 08:04 AM     Modules accepted: Orders

## 2022-10-21 NOTE — Unmapped (Signed)
Patient's daughter notified of new prescription.

## 2022-11-03 DIAGNOSIS — Z170 Estrogen receptor positive status [ER+]: Principal | ICD-10-CM

## 2022-11-03 DIAGNOSIS — C50811 Malignant neoplasm of overlapping sites of right female breast: Principal | ICD-10-CM

## 2022-11-03 MED ORDER — ANASTROZOLE 1 MG TABLET
ORAL_TABLET | ORAL | 3 refills | 0 days | Status: CP
Start: 2022-11-03 — End: ?

## 2022-11-03 NOTE — Unmapped (Signed)
Called and spoke with pt's daughter, after receiving refill request from pt's pharmacy for Anastrazole. Pt is scheduled for follow up with [facility] on 6/27. Her daughter reports she is running low on Anastrazole and would like to pick up more. This NN confirmed preferred pharmacy and forwarded refill request to provider.

## 2022-11-06 NOTE — Unmapped (Unsigned)
Name:  [name]  DOB: 14-Jun-1945  Today's Date: 11/06/2022  Age:  77 y.o.    Assessment/Plan:      Personalized Prevention Plan: A personalized prevention plan was reviewed with the patient and a written copy was provided for personal records (available for review in Patient Instructions).    Risks identified: history of alcoholism and she has not had alcohol for over a year.  The pt has not fallen.  Her depression symptoms are controlled for the most part on her current medication regimen.  She has no recent falls.  She is compliant with her medication regimen and follow up appts as scheduled.       The following preventive services were discussed with the patient: See Health Maintenance recommendations listed below    Other Exam:    Annual lab work: The patient had recent normal comprehensive lab work in October/2020 which included CBC, CMP, lipids, PT/INR.  Results reviewed with the patient today.  She is due for screening annual TSH.    Referrals: Mammogram ; awaiting Cologuard result  Vaccines: Prevnar given today and  COVID-19 vaccine recommended  Follow up visit: 4 months      Barriers to goals identified and addressed. None identified.     Provider to discuss treatment options and their associated risks/benefits with the patient at their next appointment. Provider to discuss preventive services with the patient. Refer to Provider documentation.    Subjective/Objective:      Patient ID: [name] is a 77 y.o. female who presents for an Annual Wellness Visit .    HPI- I last saw the pt in 06/2022 for routine follow up visit. 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   - For HTN she takes Coreg 3.125 mg bid  and amlodipine 5 mg daily.  She was advised to  continue home BP monitoring  low dietary salt intake and exercise.   Her BMI is 39 and she struggles with her weight but she has lost 11 lbs in the past 4 months. .  She is advised on caloric restriction to promote weight loss.  She admits to over eating.  Serum creatinine level was 0.5 in May/2024.  Of note patient had low potassium at that time.  TSH was normal in January/2024 and lipid panel done at that time revealed HDL of 64 and LDL of 98..    She has a history of alcoholism, alcoholic liver disease, macrocytosis  and encephalopathy.  She stopped drinking alcohol over 6 yrs ago and she no longer needs to take Lactulose since her mental status has remained.     Liver US done 03/2019 and results revealed cirrhotic liver morphology, unchanged lesion in the hepatic lobe likely representing a hepatic calcification and no hepatic lesions were identified. Repeat RUQ US done 08/2021 remained stable.  She was to schedule follow up appt with the liver clinic and she was seen in October/2020. LFT's done in 10/2022 were normal except for chronically elevated alk phos and total bilirubin.   She was referred back to [facility] GI/liver clinic and she never proceeded with this appt.  CBC done in in May/2024 revealed normal white blood cell count, normal H/H, MCV of 96 and platelet count of 137 K . She takes OTC Vit B12, Vit B 1  and folic acid. In addition, she takes Vit D, calcium supplements daily.     Depression history: recent decrease in dose of lexapro to 1/2 of 20 mg daily and she is doing well on this dose.     She has a history of right sided breast cancer: s/p partial mastectomy in 2022. She is followed by [facility] breast oncology and last seen last month. Her last breast imaging was in 07/2022. She continues on anastrozole 1 mg daily for a total of 5 yrs. She has upcoming  oncology visit in 11/2022 .         The patient came to the appointment alone.    Medications, allergies, medical history, surgical history, family history,  social history, and previous hospitalization history were reviewed and updated in EMR today.    Vitals and BMI were reviewed.     Preventative health screening tests and immunization records were reviewed.  Appropriate age related screening tests were ordered today if patient is due and agreeable to have tests completed.  See assessment/plan.    Current Providers:     Patient Care Team:  Jenell Milliner, MD as PCP - General  Jenell Milliner, MD as PCP - Myrene Buddy, Maryjane Hurter, MD as Consulting Physician (Radiation Oncology)  Delrae Rend, MD as Attending Provider (Medical Oncology)   [facility] GI[facility]liver clinic    Health Risk Assessment (HRA):     Balance and gait assessment:  Timed Get up an Go to perform walk a distance of 10 ft.: 8seconds. Her gait is tentative and she has bilateral knee pain due to DJD. Results discussed with patient. If abnormal, provider notified.      Normative Reference Values by Age   Time in Seconds    37 - 69 years  8.1 (7.1 - 9.0)    70 - 79 years  9.2 (8.2 - 10.2)    80 - 99 years  11.3 (10.0 - 12.7)      Cognitive Assessment:    Mini-Cog score: 3 words recalled, with normal clock drawing    Health Literacy: How confident are you that you understand your health issues/concerns, can participate in your care, and manage your care along with your physician: confident.    Safety & Fall Risk:    1) Any falls in the past year? No    2) Any use of assistive devices to ambulate? No    Current Suppliers (DME): none      Functional ability evaluation:    1) Does patient need assistance with ADLs, managing finances, remembering to take medications, using the phone, shopping for food, making meals, and performing housework? No    Educated patient on the importance of carrying a medication list with them at all times.    2) Is patient is incontinent of urine or stool? No    3) Does the patient still drive? No  If yes, are you having difficulties driving?  Not Applicable.    Does the patient fasten their seat belt?Other.    Hearing loss screen:    1)  Hearing Test Finger Rub Test:  normal    2) Does pt wear hearing aids? No    3)Does patient have to strain or struggle to hear/understand conversations? No    Exercise and Nutrition:     1) Has patient had any unplanned weight loss in the last 3 months? No    2) Does the patient exercise and if yes, how often? no  3) Does patient have a pressure ulcer or non healing wound? No    4) Would patient like to be referred to nutrition if they have a calculated body mass index < 18.5 or > 30? No    Estimated body mass index is 40.85 kg/m?? as calculated from the following:    Height as of 06/09/22: 162.6 cm (5' 4).    Weight as of 10/20/22: 108 kg (238 lb).  No height and weight on file for this encounter.      Health maintenance:  Mammogram: 08/2017; patient referred  Bone density screening: not covered by Medicare  Colonoscopy: declined by the pt submitted Cologaurd last week and awaiting results  Influenza vaccine: Up-to-date 2020  Hep A and hep B series: Completed 2019  Pneumovax: 2019  Prevnar: Administered today  COVID-19 vaccine: Recommended  Shingrix: Series completed, 2020  Eye exam:  Pt to schedule in the near future  Breast  exam: performed today    End of life planning:  She is planning to address this ASAP    11/06/2022    Jenell Milliner, MD     The patient reports having no living will. ACP form given to the pt to review, complete and return scan into EMR         Psychosocial risks:     Social history updated and reviewed.    Past history of Psychiatric illness: depression    PHQ-9: improved    Discussed and reviewed results with the patient.     Physical exam  Vital signs as recorded in EMR  Neck exam: no masses, carotid pulses without bruits  Lung exam: clear  Cardiac exam: RRR nl s1 and s2  Breast exam: no masses or nipple discharge  Abdominal exam: non tender, no HSM or masses palpated  Extremity exam: no edema, distal pulses are normal         Jenell Milliner, MD  11/06/2022

## 2022-11-27 ENCOUNTER — Ambulatory Visit: Admit: 2022-11-27 | Discharge: 2022-11-28 | Payer: MEDICARE

## 2022-11-27 ENCOUNTER — Ambulatory Visit: Admit: 2022-11-27 | Discharge: 2022-11-28 | Payer: MEDICARE | Attending: Family | Primary: Family

## 2022-11-27 DIAGNOSIS — Z170 Estrogen receptor positive status [ER+]: Principal | ICD-10-CM

## 2022-11-27 DIAGNOSIS — Z780 Asymptomatic menopausal state: Principal | ICD-10-CM

## 2022-11-27 DIAGNOSIS — C50811 Malignant neoplasm of overlapping sites of right female breast: Principal | ICD-10-CM

## 2022-11-27 NOTE — Unmapped (Addendum)
Continue anastrozole.   We will wait for the bone density test results before making recommendations about your bone health.  Take calcium 1200mg  daily + vitamin D3 1000iu daily.   Make a dental appointment as soon as possible.    Dr. Nilda Riggs and I have clinic on Tuesday, Thursday and Friday. Thursdays we are at the [facility].   Return in 3-4 months, date flexible on when you and your daughter can come together.       [name] Miner, NP  [facility] Breast Oncology    For appointments & questions Monday through Friday 8 AM-- 4:30 PM:  please call [phone] or Toll free [phone].     On Nights, Weekends and Holidays:  Call [phone] and ask for the Oncologist on call.

## 2022-11-27 NOTE — Unmapped (Signed)
Pt did have a fall last week in her home down a few steps. Did see PCP and was told she was deeply bruised.

## 2022-11-27 NOTE — Unmapped (Signed)
BREAST MEDICAL ONCOLOGY ENCOUNTER  -----------------------------------------------------------------------------------------------------------------  Referring Physician: Nicki Guadalajara*  PCP: Jenell Milliner, MD  Consulting Physicians: Surgical oncology: Lucretia Roers, MD.  Radiation oncology: Rayetta Humphrey, MD.     Reason for Visit: The patient is seen in consultation at the request of Dr. Ruthy Dick [facility]* for evaluation of breast cancer.  -----------------------------------------------------------------------------------------------------  Assessment: [name] is a 77 y.o. female from [facility] with screening detected (R) HR+/HER2 low (2+) IDC, pT1c pN1a, G2, 1.7 cm. S/p right partial mastectomy 09/03/20 and radiation 01/09/21. Oncotype testing 8, no adjuvant chemotherapy was recommended. She completed radiation and subsequently started anastrozole on 01/23/21.     She is clinically NED on today's visit however I am concerned about her bone density. DEXA today is pending. She had a recent fall. She is at increased risk for hip fracture, which we discussed.  We reviewed fall precautions.  Bone-directed therapy for low bone mass is complicated by very poor dentition and that she likely needs full extractions/implants/dentures.  She is not a good candidate for tamoxifen given history of liver disease.  For now, will continue exemestane and wait for the DEXA results.  Short interval followup in 3 months.  I have asked her to make a dental appointment before the next visit.     Plan  1.  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   Chemotherapy: Oncotype score- 8, no adjuvant chemo   Radiation: s/p whole breast radiation and tumor bed boost, completed 01/09/21  Endocrine therapy: Plan for 5-10 years of AI. Started anastrozole 01/23/21.  Bone health: Dexa 04/18/21 osteopenia; lowest T score -2.4, repeat 11/27/22 pending.  Breast imaging: MMG on 07/2022, BI-RADS 2, repeat in 1 year  Systemic staging: PET from 10/25/20 negative for evidence of metastatic disease. Following w/PCP for GI findings  Genetics: None at this time.     2.  Supportive care  -- Hx of liver disease. Takes lactulose 3x daily. Follows with Dr. Raford Pitcher.   -- Arthralgias. In back and right knee. Chronic. Stable. Takes Tylenol.   -- Back lesions. Abnormal hyperpigmented keratosis to right lower back. Follows with dermatologist.   -- Hot flashes: Vitamin E, 400 IU daily.     3. Follow-up  -- review DEXA when available. She asks that I call daughter [name] with results.  --  RTC 3 months with me/Dr. Nilda Riggs.  -- return to Dr. Carles Collet with MMG in 07/2023    ** Prefers Ono **     Griselda Miner, NP  [facility] Breast Medical Oncology    I personally spent 40 minutes face-to-face and non-face-to-face in the care of this patient, which includes all pre, intra, and post visit time on the date of service.  All documented time was specific to the E/M visit and does not include any procedures that may have been performed.    -----------------------------------------------------------------------------------------------------  Interval history:   -- Patient presenting for follow-up unaccompanied. Saw Dr. Carles Collet in Feb with MMG, benign.   -- No new breast/CW complaints.  -- Tolerating anastrozole well.  -- Fell at home about a month ago, down about 7 stairs.  Had increasing pain over the day or two after, was seen and had an extensive workup that did not reveal any fractures.   Had dropped something, bent over to pick it up, stood up quickly, got dizzy and fell.  Does occasionally have postural dizziness if she gets up too fast.   No  recent prior falls.   --Significant dental issues.  Teeth break off while brusing or eating soft foods.  Has avoided going to the dentist but figures that she probably needs dentures.  -- Lives with her husband, daughter and 14yo granddaughter. Her daughter manages all of her medications for her.   -- Right knee and lower back arthralgias. Chronic.     Review of Systems: A complete twelve systems review was obtained and is positive per the HPI but otherwise negative in detail. See MIMS #1170 where available.    Functional Status: ECOG PS 0. ADLS independent. IADLS independent. Uses wheelchair for long walks. Cognition intact.     Social: Lives in [place], [place] with husband, daughter [name], and her granddaughter. Has another daughter [name], three grandchildren, and a brother nearby. Married for 50 years. Retired. 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     History of the Present Illness: [name] is a pleasant 77 y.o. female who  has a past medical history of Alcoholism (CMS-HCC), Alcoholism /alcohol abuse, Cirrhosis (CMS-HCC), Depression, Hypertension, Liver disease, and Malignant neoplasm of overlapping sites of right breast in female, estrogen receptor positive (CMS-HCC) (10/03/2020). She is seen in consultation at the request of Dr. Ruthy Dick [facility]* for evaluation of breast cancer. She had an abnormal screening MMG 06/20/20. She had diagnostic MMG/US of right breast 07/23/20 with 0.7 x 0.6 x 0.6 cm irregular hypoechoic nonparallel mass with indistinct margins and peripheral vascularity at the 5:00 position approximately 8 CFN. Core bx of right breast 07/30/20 with IDC, G2, ER+(100%), PR+(100%), and HER-2 negative (2+ by IHC, FISH not amplified). She underwent right partial mastectomy 09/03/20. Final path: IDC, 1.7 cm in greatest dimension, G2, associated DCIS. Negative margins. 1/1 LN positive for carcinoma.     Hematology/Oncology History Overview Note   Oncotype?     Malignant neoplasm of overlapping sites of right breast in female, estrogen receptor positive (CMS-HCC)   07/30/2020 Biopsy    A: Breast, right, core biopsy  - Invasive ductal carcinoma (see comment)  - Nottingham combined histologic grade: 2               Tubule formation: 3               Nuclear grade: 2  Mitotic score: 1  - Invasive carcinoma measures approximately 3.5 mm in this specimen  - Solid papillary ductal carcinoma in situ  - Ancillary studies (see biomarker synoptic)               Estrogen receptor: Positive (100%)               Progesterone receptor: Positive (100%)               HER2 IHC: Equivocal (2+)  HER2 FISH: Insufficient cellularity     09/03/2020 Surgery    A.  Breast, right, partial mastectomy  - Invasive ductal carcinoma (see synoptic report)  - Tumor size: 17 mm in greatest dimension  - Ductal carcinoma in situ (DCIS), grade 2, solid papillary and cribriform types  - Size extent of DCIS: At least 55 mm   - Margin status for specimen A (see extended margins below)          Invasive carcinoma: Negative, <1 mm from anterior and inferior margins          Ductal carcinoma in situ: Negative, <  1 mm from superior margin  - Ancillary studies reported on prior core biopsy [phone]          Estrogen receptor: Positive (100%)          Progesterone receptor: Positive (100%)          HER2 IHC: Equivocal (2+)          HER2 FISH: Insufficient cellularity (see comment)     B.  Breast, right, superior margin,excision  - Single microscopic focus of ductal carcinoma in situ  - Margin: Negative, <1 mm    C.  Breast, right, lateral margin, excision  - Negative for carcinoma    D.  Breast, right,  inferior margin, excision  - Negative for carcinoma    E.  Breast, right, medial margin, excision  - Negative for carcinoma     F. Right axilla, sentinel lymph node, biopsy  - One lymph node positive for carcinoma (1/1)     09/03/2020 -  Cancer Staged    Staging form: Breast, AJCC 8th Edition  - Pathologic stage from 09/03/2020: Stage IA (pT1c, pN1a, cM0, G2, ER+, PR+, HER2-, Oncotype DX score: 8) - Signed by Marcy Panning, FNP on 11/06/2021       11/08/2020 -  Radiation    Radiation Therapy Treatment Details (Noted on 11/08/2020)  Site: Right Breast  Technique: 3D CRT  Goal: No goal specified  Planned Treatment Start Date: No planned start date specified         Patient Active Problem List   Diagnosis    Alcoholic liver disease (CMS-HCC)    Hypokalemia    HTN (hypertension)    Chronic maxillary sinusitis    Alcoholism (CMS-HCC)    Encephalopathy    Depression with anxiety    Morbid (severe) obesity due to excess calories (CMS-HCC)    Macrocytosis    DJD (degenerative joint disease)    Malignant neoplasm of overlapping sites of right breast in female, estrogen receptor positive (CMS-HCC)    Liver lesion       Past Medical History:   Diagnosis Date    Alcoholism (CMS-HCC)     Alcoholism /alcohol abuse     Cirrhosis (CMS-HCC)     Depression     Hypertension     Liver disease     Malignant neoplasm of overlapping sites of right breast in female, estrogen receptor positive (CMS-HCC) 10/03/2020       Past Surgical History:   Procedure Laterality Date    BREAST BIOPSY Right     benign-a long time ago    BREAST BIOPSY Right 07/2020    malignant    BREAST LUMPECTOMY Right     4 2022    CHOLECYSTECTOMY      PR BX/REMV,LYMPH NODE,DEEP AXILL Right 09/03/2020    Procedure: BX/EXC LYMPH NODE; OPEN, DEEP AXILRY NODE;  Surgeon: Aris Everts, MD;  [facility];  Service: Surgical Oncology Breast    PR INTRAOPERATIVE SENTINEL LYMPH NODE ID W DYE INJECTION Right 09/03/2020    Procedure: INTRAOPERATIVE IDENTIFICATION SENTINEL LYMPH NODE(S) INCLUDE INJECTION NON-RADIOACTIVE DYE, WHEN PERFORMED;  Surgeon: Aris Everts, MD;  [facility];  Service: Surgical Oncology Breast    PR MASTECTOMY, PARTIAL Right 09/03/2020    Procedure: MASTECTOMY, PARTIAL (EG, LUMPECTOMY, TYLECTOMY, QUADRANTECTOMY, SEGMENTECTOMY);  Surgeon: Aris Everts, MD;  Location: [facility] OR [facility];  Service: Surgical Oncology Breast    PR UPPER GI ENDOSCOPY,BIOPSY N/A 02/03/2018    Procedure: UGI ENDOSCOPY;  WITH BIOPSY, SINGLE OR MULTIPLE; Surgeon: Alfred Levins, MD;  Location: [facility] PROCEDURES [facility];  Service: Gastroenterology    RADIATION Right     unsure finish date    TUBAL LIGATION       Gyn History: G2P2. Menopause in her 61s. Denies HRT.     Medications:  Current Outpatient Medications   Medication Sig Dispense Refill    acetaminophen (TYLENOL) 325 MG tablet Take 2 tablets (650 mg total) by mouth every six (6) hours as needed for pain.  0    amlodipine (NORVASC) 5 MG tablet Take 1 tablet (5 mg total) by mouth daily. TAKE 1 TABLET(5 MG) BY MOUTH DAILY 90 tablet 1    anastrozole (ARIMIDEX) 1 mg tablet TAKE 1 TABLET(1 MG) BY MOUTH DAILY 90 tablet 3    calcium carbonate 1,500 mg (600 mg elem calcium) tablet Take 1 tablet (600 mg of elem calcium total) by mouth daily.      carvediloL (COREG) 3.125 MG tablet TAKE 1 TABLET(3.125 MG) BY MOUTH TWICE DAILY 180 tablet 3    cyanocobalamin 1000 MCG tablet Take 1 tablet (1,000 mcg total) by mouth daily.      escitalopram oxalate (LEXAPRO) 20 MG tablet Take 1 tablet (20 mg total) by mouth daily. TAKE 1 TABLET(20 MG) BY MOUTH DAILY (Patient taking differently: Take 1 tablet (20 mg total) by mouth daily. Take 1/2 a tab daily) 90 tablet 3    folic acid (FOLVITE) 1 MG tablet Take 1 tablet (1 mg total) by mouth daily. 90 tablet 1    lactulose (CHRONULAC) 10 gram/15 mL solution TAKE 15 ML BY MOUTH THREE TIMES DAILY, TAKE ENOUGH TO HAVE 3 BOWEL MOVEMENTS PER DAY (Patient taking differently: TAKE 15 ML BY MOUTH THREE TIMES DAILY, TAKE ENOUGH TO HAVE 3 BOWEL MOVEMENTS PER DAY- she takes this prn) 1350 mL 5    magnesium oxide (MAG-OX) 400 mg (241.3 mg elemental magnesium) tablet TAKE 1 TABLET(400 MG) BY MOUTH TWICE DAILY 180 tablet 1    potassium chloride 20 MEQ ER tablet Take 1 tablet (20 mEq total) by mouth two (2) times a day. 180 tablet 1    vitamin E-268 mg, 400 UNIT, 268 mg (400 UNIT) capsule Take 1 capsule (268 mg total) by mouth daily. 2 tabs       No current facility-administered medications for this visit.       Allergies:  Allergies   Allergen Reactions    Sulfa (Sulfonamide Antibiotics) Anaphylaxis    Sulfur Anaphylaxis     Tolerated sulfur colloid injection without incident 09/03/2020.    Aspirin      thrombocytopenia       Family History: Cancer-related family history is negative for Breast cancer, Cancer, Colon cancer, and Ovarian cancer.    Social History:   Social History     Social History Narrative    Daughter fills med boxes weekly.      Physical Examination:  Vital Signs: BP 143/68  - Pulse 82  - Temp 36.8 ??C (98.2 ??F) (Temporal)  - Resp 18  - Wt (!) 102.5 kg (225 lb 14.4 oz)  - SpO2 97%  - BMI 38.78 kg/m??   General: Healthy-appearing patient in no acute distress. Presents in hospital wheelchair.  HEENT: EOMI, sclerae clear.  All lower teeth are eroded, extensive dental caries.  Cardiovascular: Normal S1 and S2. RRR. No audible murmurs.  Respiratory: Chest clear to percussion and auscultation.   Gastrointestinal: Abdomen soft without masses and tenderness.  Musculoskeletal: No bony pain or tenderness.   Skin: no concerning lesions on the breasts/chest/axillae  Breasts: (R) breast: Benign. Well healed incision in UOQ. Healed incision in inferior aspect extending down to the inframammary fold. (L) breast: benign. No axillary adenopathy bilat.   Neurologic:  Alert and oriented. Grossly non-focal  Lymphatic: No cervical, axillary or supraclavicular adenopathy.   Extremity: No lower extremity edema or upper extremity lymphedema    DATA REVIEW:  Laboratory: I personally reviewed the pertinent laboratory data.  Radiology: I personally reviewed the pertinent imaging.  Pathology: I personally reviewed the pathology report.

## 2023-01-15 DIAGNOSIS — F418 Other specified anxiety disorders: Principal | ICD-10-CM

## 2023-01-15 MED ORDER — ESCITALOPRAM 20 MG TABLET
ORAL_TABLET | Freq: Every day | ORAL | 3 refills | 90 days | Status: CP
Start: 2023-01-15 — End: ?

## 2023-01-15 NOTE — Unmapped (Signed)
Patient is requesting the following refill  Requested Prescriptions     Pending Prescriptions Disp Refills    escitalopram oxalate (LEXAPRO) 20 MG tablet 90 tablet 3     Sig: Take 1 tablet (20 mg total) by mouth daily. TAKE 1 TABLET(20 MG) BY MOUTH DAILY       Recent Visits  Date Type Provider Dept   06/09/22 Office Visit Jenell Milliner, MD [facility] [address] [facility]   Showing recent visits within past 365 days and meeting all other requirements  Future Appointments  No visits were found meeting these conditions.  Showing future appointments within next 365 days and meeting all other requirements       Labs:

## 2023-02-01 DIAGNOSIS — K766 Portal hypertension: Principal | ICD-10-CM

## 2023-02-01 DIAGNOSIS — I10 Essential (primary) hypertension: Principal | ICD-10-CM

## 2023-02-01 MED ORDER — AMLODIPINE 5 MG TABLET
ORAL_TABLET | Freq: Every day | ORAL | 1 refills | 0 days
Start: 2023-02-01 — End: ?

## 2023-02-01 MED ORDER — FOLIC ACID 1 MG TABLET
ORAL_TABLET | ORAL | 1 refills | 0 days
Start: 2023-02-01 — End: ?

## 2023-02-01 MED ORDER — MAGNESIUM OXIDE 400 MG (241.3 MG MAGNESIUM) TABLET
ORAL_TABLET | 1 refills | 0 days
Start: 2023-02-01 — End: ?

## 2023-02-01 MED ORDER — CARVEDILOL 3.125 MG TABLET
ORAL_TABLET | 3 refills | 0 days
Start: 2023-02-01 — End: ?

## 2023-02-03 MED ORDER — AMLODIPINE 5 MG TABLET
ORAL | 0 refills | 90 days | Status: CP
Start: 2023-02-03 — End: 2024-02-03

## 2023-02-03 MED ORDER — FOLIC ACID 1 MG TABLET
ORAL_TABLET | Freq: Every day | ORAL | 0 refills | 30 days | Status: CP
Start: 2023-02-03 — End: 2024-02-03

## 2023-02-03 MED ORDER — MAGNESIUM OXIDE 400 MG (241.3 MG MAGNESIUM) TABLET
ORAL_TABLET | Freq: Two times a day (BID) | ORAL | 0 refills | 90 days | Status: CP
Start: 2023-02-03 — End: 2024-02-03

## 2023-02-03 MED ORDER — CARVEDILOL 3.125 MG TABLET
ORAL_TABLET | Freq: Two times a day (BID) | ORAL | 0 refills | 90 days | Status: CP
Start: 2023-02-03 — End: 2024-02-03

## 2023-02-03 NOTE — Unmapped (Signed)
Patient is requesting the following refill  Requested Prescriptions     Pending Prescriptions Disp Refills    folic acid (FOLVITE) 1 MG tablet [Pharmacy Med Name: FOLIC ACID 1MG  TABLETS] 90 tablet 1     Sig: TAKE 1 TABLET(1 MG) BY MOUTH DAILY    amlodipine (NORVASC) 5 MG tablet [Pharmacy Med Name: AMLODIPINE BESYLATE 5MG  TABLETS] 90 tablet 1     Sig: TAKE 1 TABLET(5 MG) BY MOUTH DAILY    magnesium oxide (MAG-OX) 400 mg (241.3 mg elemental magnesium) tablet [Pharmacy Med Name: MAG-OXIDE 400MG  TABLETS] 180 tablet 1     Sig: TAKE 1 TABLET(400 MG) BY MOUTH TWICE DAILY    carvedilol (COREG) 3.125 MG tablet [Pharmacy Med Name: CARVEDILOL 3.125MG  TABLETS] 180 tablet 3     Sig: TAKE 1 TABLET(3.125 MG) BY MOUTH TWICE DAILY       Recent Visits  Date Type Provider Dept   06/09/22 Office Visit Jenell Milliner, MD [facility] [address] [facility]   Showing recent visits within past 365 days and meeting all other requirements  Future Appointments  No visits were found meeting these conditions.  Showing future appointments within next 365 days and meeting all other requirements       Labs: Vitals:   BP Readings from Last 3 Encounters:   11/27/22 143/68   10/20/22 158/69   07/17/22 155/80    and   Pulse Readings from Last 3 Encounters:   11/27/22 82   10/20/22 74   07/17/22 66

## 2023-02-04 MED ORDER — FOLIC ACID 1 MG TABLET
ORAL_TABLET | Freq: Every day | ORAL | 1 refills | 90 days
Start: 2023-02-04 — End: 2024-02-04

## 2023-02-04 NOTE — Unmapped (Signed)
Patient is requesting the following refill  Requested Prescriptions     Pending Prescriptions Disp Refills    folic acid (FOLVITE) 1 MG tablet [Pharmacy Med Name: FOLIC ACID 1MG  TABLETS] 90 tablet 1     Sig: Take 1 tablet (1 mg total) by mouth daily.       Recent Visits  Date Type Provider Dept   06/09/22 Office Visit Jenell Milliner, MD [facility] [address] [facility]   Showing recent visits within past 365 days and meeting all other requirements  Future Appointments  No visits were found meeting these conditions.  Showing future appointments within next 365 days and meeting all other requirements       Labs: Not applicable this refill

## 2023-02-05 MED ORDER — FOLIC ACID 1 MG TABLET
ORAL_TABLET | Freq: Every day | ORAL | 1 refills | 90 days | Status: CP
Start: 2023-02-05 — End: 2024-02-05

## 2023-02-17 MED ORDER — POTASSIUM CHLORIDE ER 20 MEQ TABLET,EXTENDED RELEASE(PART/CRYST)
ORAL_TABLET | 1 refills | 0 days | Status: CP
Start: 2023-02-17 — End: ?

## 2023-02-17 NOTE — Unmapped (Signed)
Patient is requesting the following refill  Requested Prescriptions     Pending Prescriptions Disp Refills    potassium chloride 20 MEQ ER tablet [Pharmacy Med Name: POTASSIUM CL ER TABLETS] 180 tablet 1     Sig: TAKE 1 TABLET(20 MEQ) BY MOUTH TWICE DAILY       Recent Visits  Date Type Provider Dept   06/09/22 Office Visit Jenell Milliner, MD [facility] [address] [facility]   Showing recent visits within past 365 days and meeting all other requirements  Future Appointments  No visits were found meeting these conditions.  Showing future appointments within next 365 days and meeting all other requirements       Labs: Potassium:   Potassium (mmol/L)   Date Value   10/20/2022 2.9 (L)

## 2023-03-05 ENCOUNTER — Ambulatory Visit: Admit: 2023-03-05 | Discharge: 2023-03-06 | Payer: MEDICARE

## 2023-03-05 DIAGNOSIS — Z170 Estrogen receptor positive status [ER+]: Principal | ICD-10-CM

## 2023-03-05 DIAGNOSIS — M816 Localized osteoporosis [Lequesne]: Principal | ICD-10-CM

## 2023-03-05 DIAGNOSIS — C50811 Malignant neoplasm of overlapping sites of right female breast: Principal | ICD-10-CM

## 2023-03-05 MED ORDER — NYSTATIN 100,000 UNIT/GRAM TOPICAL POWDER
0 refills | 0 days | Status: CP
Start: 2023-03-05 — End: 2024-03-04

## 2023-03-05 NOTE — Unmapped (Signed)
Our plan:  Continue anastrozole  Make a dentist appointment.  Need to be healed from dental work before starting bone strengthening medication.   Make a primary care followup appointment.  Prevent falls. Think about physical therapy for fall prevention.     Return in February for mammogram and Dr. Carles Collet.  Dr. Nilda Riggs and I will see you in 6 months.    Your Breast Oncology Care team is:  Breast Oncologist: Hewitt Blade, MD  Breast Oncology Advanced Practice Provider: Griselda Miner, NP  Breast Oncology Nurse Navigator: Everardo All, RN  Breast Oncology Pharmacist: Raelene Bott, PharmD, BCOP, CPP      It was a pleasure seeing you today.  Griselda Miner, NP  [facility] Breast Oncology    For appointments & questions Monday through Friday 8 AM-- 5:00 PM:  please call [phone] or Toll free [phone].     On Nights, Weekends and Holidays:  Call [phone] and ask for the Oncology Fellow on call.

## 2023-03-05 NOTE — Unmapped (Signed)
BREAST MEDICAL ONCOLOGY ENCOUNTER  -----------------------------------------------------------------------------------------------------------------  Referring Physician: Griselda Miner, FNP  PCP: [name] Milliner, MD  Consulting Physicians: Surgical oncology: [name] Roers, MD.  Radiation oncology: [name] Humphrey, MD.     Reason for Visit: The patient is seen in consultation at the request of Dr. Griselda [name] for evaluation of breast cancer.  -----------------------------------------------------------------------------------------------------  Assessment: [name] is a 77 y.o. female from [facility] with screening detected (R) HR+/HER2 low (2+) IDC, pT1c pN1a, G2, 1.7 cm. S/p right partial mastectomy 09/03/20 and radiation 01/09/21. Oncotype testing 8, no adjuvant chemotherapy was recommended. She completed radiation and subsequently started anastrozole on 01/23/21.     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 We have discussed the increased risk for hip fracture and the need to prevent falls.    Bone-directed therapy is complicated by very poor dentition and that she likely needs dental extractions/dentures and does not currently have a dentist  She is not a good candidate to change to tamoxifen, given liver disease and cirrhosis.  Although the risk of ONJ is very low for oral bisphosphonates, it may be reasonable to delay the start of an oral bisphosphonate until she has a dental evaluation and a plan.  Her daughter will assist in making an appointment and will keep [place] posted.    Plan  1.  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   Chemotherapy: Oncotype score: 8, no adjuvant chemo   Radiation: s/p whole breast radiation and tumor bed boost, completed 01/09/21  Endocrine therapy: Plan for 5-10 years of AI. Started anastrozole 01/23/21.  Bone health: see below  Breast imaging: MMG on 07/2022, BI-RADS 2, repeat in 1 year  Systemic staging: PET from 10/25/20 negative for evidence of metastatic disease. Following w/PCP for GI findings  Genetics: None at this time.     2. Osteoporosis  Dexa 04/18/21 osteopenia; lowest T score -2.4,   DEXA 11/27/22 osteoporosis, lowest T score -2.9 femoral neck  --taking calcium/vit D  --at risk for falls, 03/05/23: plan alendronate although needs dental extractions, will delay starting until extractions are complete if done reasonably soon, otherwise may go ahead and start treatment    3.  Supportive care  -- Hx of liver disease. Takes lactulose 3x daily. Follows with [name] (? - no notes in Epic).   -- Hot flashes: Vitamin E, 400 IU daily.   -- Yeast intertrigo, inframammary folds: Nystatin powder BID, keep skin dry.      3. Follow-up  --make dental appt asap & consider starting alendronate before next visit - we will rech out to dtr [name] in 1 month to check in   --make routine PCP appt given multiple comorbidities  --continue anastrozole  -- return to Dr. Carles Collet with MMG in 07/2023  -- RTC [name]/[name] in 6 months    ** Prefers [place] **     [name] Miner, NP  [facility] Breast Medical Oncology    I personally spent 40 minutes face-to-face and non-face-to-face in the care of this patient, which includes all pre, intra, and post visit time on the date of service.  All documented time was specific to the E/M visit and does not include any procedures that may have been performed.    -----------------------------------------------------------------------------------------------------  Interval history 03/05/2023:   Patient presenting for followup with her daughter [name].  Adherent to anastrozole.   We  have reviewed the last DEXA which shows progression of bone loss to osteoporosis in the femoral neck.    We didn't start bone directed therapy this summer due to the need for dental work and breaking teeth. Reports that she had good dental health until 2022 when she started to notice gray and fracturing teeth even when eating soft foods. Hasn't been to the dentist.   Taking calcium with D3.   Her PCP Dr. Vinson [name] retired. She is considering who to see in the same [facility] practice.   She remains cautious about falls.  She presents to clinic in a wheelchair today.  Her last fall was significant, in May 2024, when she fell down several stairs.  No fractures.  Had postural dizziness that prompted the Fall.   Lives with her husband, daughter and 14yo granddaughter. Her daughter manages all of her medications for her.   Right knee and intermittent lower back pain. Chronic and unchanged.    Review of Systems: A complete twelve systems review was obtained and is positive per the HPI but otherwise negative in detail. See MIMS #1170 where available.    Functional Status: ECOG PS 0. ADLS independent. IADLS independent. Uses wheelchair for long walks. Cognition intact.     Social: Lives in [place], [place] with husband, daughter [name], and her granddaughter. Has another daughter [name], three grandchildren, and a brother nearby. Married for 50 years. Retired. 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     History of the Present Illness: [name] is a pleasant 77 y.o. female who  has a past medical history of Alcoholism (CMS-HCC), Alcoholism /alcohol abuse, Cirrhosis (CMS-HCC), Depression, Hypertension, Liver disease, and Malignant neoplasm of overlapping sites of right breast in female, estrogen receptor positive (CMS-HCC) (10/03/2020). She is seen in consultation at the request of Dr. Griselda [name] for evaluation of breast cancer. She had an abnormal screening MMG 06/20/20. She had diagnostic MMG/US of right breast 07/23/20 with 0.7 x 0.6 x 0.6 cm irregular hypoechoic nonparallel mass with indistinct margins and peripheral vascularity at the 5:00 position approximately 8 CFN. Core bx of right breast 07/30/20 with IDC, G2, ER+(100%), PR+(100%), and HER-2 negative (2+ by IHC, FISH not amplified). She underwent right partial mastectomy 09/03/20. Final path: IDC, 1.7 cm in greatest dimension, G2, associated DCIS. Negative margins. 1/1 LN positive for carcinoma.     Hematology/Oncology History Overview Note   Oncotype?     Malignant neoplasm of overlapping sites of right breast in female, estrogen receptor positive (CMS-HCC)   07/30/2020 Biopsy    A: Breast, right, core biopsy  - Invasive ductal carcinoma (see comment)  - Nottingham combined histologic grade: 2               Tubule formation: 3               Nuclear grade: 2  Mitotic score: 1  - Invasive carcinoma measures approximately 3.5 mm in this specimen  - Solid papillary ductal carcinoma in situ  - Ancillary studies (see biomarker synoptic)               Estrogen receptor: Positive (100%)               Progesterone receptor: Positive (100%)               HER2 IHC: Equivocal (2+)  HER2  FISH: Insufficient cellularity     09/03/2020 Surgery    A.  Breast, right, partial mastectomy  - Invasive ductal carcinoma (see synoptic report)  - Tumor size: 17 mm in greatest dimension  - Ductal carcinoma in situ (DCIS), grade 2, solid papillary and cribriform types  - Size extent of DCIS: At least 55 mm   - Margin status for specimen A (see extended margins below)          Invasive carcinoma: Negative, <1 mm from anterior and inferior margins          Ductal carcinoma in situ: Negative, <1 mm from superior margin  - Ancillary studies reported on prior core biopsy [id number]          Estrogen receptor: Positive (100%)          Progesterone receptor: Positive (100%)          HER2 IHC: Equivocal (2+)          HER2 FISH: Insufficient cellularity (see comment)     B.  Breast, right, superior margin,excision  - Single microscopic focus of ductal carcinoma in situ  - Margin: Negative, <1 mm    C.  Breast, right, lateral margin, excision  - Negative for carcinoma    D.  Breast, right,  inferior margin, excision  - Negative for carcinoma    E.  Breast, right, medial margin, excision  - Negative for carcinoma     F. Right axilla, sentinel lymph node, biopsy  - One lymph node positive for carcinoma (1/1)     09/03/2020 -  Cancer Staged    Staging form: Breast, AJCC 8th Edition  - Pathologic stage from 09/03/2020: Stage IA (pT1c, pN1a, cM0, G2, ER+, PR+, HER2-, Oncotype DX score: 8) - Signed by Marcy Panning, FNP on 11/06/2021       11/08/2020 -  Radiation    Radiation Therapy Treatment Details (Noted on 11/08/2020)  Site: Right Breast  Technique: 3D CRT  Goal: No goal specified  Planned Treatment Start Date: No planned start date specified         Patient Active Problem List   Diagnosis    Alcoholic liver disease (CMS-HCC)    Hypokalemia    HTN (hypertension)    Chronic maxillary sinusitis    Alcoholism (CMS-HCC)    Encephalopathy    Depression with anxiety    Morbid (severe) obesity due to excess calories (CMS-HCC)    Macrocytosis    DJD (degenerative joint disease)    Malignant neoplasm of overlapping sites of right breast in female, estrogen receptor positive (CMS-HCC)    Liver lesion       Past Medical History:   Diagnosis Date    Alcoholism (CMS-HCC)     Alcoholism /alcohol abuse     Cirrhosis (CMS-HCC)     Depression     Hypertension     Liver disease     Malignant neoplasm of overlapping sites of right breast in female, estrogen receptor positive (CMS-HCC) 10/03/2020       Past Surgical History:   Procedure Laterality Date    BREAST BIOPSY Right     benign-a long time ago    BREAST BIOPSY Right 07/2020    malignant    BREAST LUMPECTOMY Right     4 2022    CHOLECYSTECTOMY      PR BX/REMV,LYMPH NODE,DEEP AXILL Right 09/03/2020    Procedure: BX/EXC LYMPH NODE; OPEN, DEEP AXILRY NODE;  Surgeon: Aris Everts, MD;  Location: [facility];  Service: Surgical Oncology Breast    PR INTRAOPERATIVE SENTINEL LYMPH NODE ID W DYE INJECTION Right 09/03/2020 Procedure: INTRAOPERATIVE IDENTIFICATION SENTINEL LYMPH NODE(S) INCLUDE INJECTION NON-RADIOACTIVE DYE, WHEN PERFORMED;  Surgeon: Aris Everts, MD;  Location: ASC [facility];  Service: Surgical Oncology Breast    PR MASTECTOMY, PARTIAL Right 09/03/2020    Procedure: MASTECTOMY, PARTIAL (EG, LUMPECTOMY, TYLECTOMY, QUADRANTECTOMY, SEGMENTECTOMY);  Surgeon: Aris Everts, MD;  Location: ASC OR [facility];  Service: Surgical Oncology Breast    PR UPPER GI ENDOSCOPY,BIOPSY N/A 02/03/2018    Procedure: UGI ENDOSCOPY; WITH BIOPSY, SINGLE OR MULTIPLE;  Surgeon: Alfred Levins, MD;  Location: [facility] PROCEDURES [facility];  Service: Gastroenterology    RADIATION Right     unsure finish date    TUBAL LIGATION       Gyn History: G2P2. Menopause in her 62s. Denies HRT.     Medications:  Current Outpatient Medications   Medication Sig Dispense Refill    acetaminophen (TYLENOL) 325 MG tablet Take 2 tablets (650 mg total) by mouth every six (6) hours as needed for pain.  0    amlodipine (NORVASC) 5 MG tablet Take 1 tablet (5 mg total) by mouth daily. TAKE 1 TABLET(5 MG) BY MOUTH DAILY 90 tablet 0    anastrozole (ARIMIDEX) 1 mg tablet TAKE 1 TABLET(1 MG) BY MOUTH DAILY 90 tablet 3    calcium carbonate 1,500 mg (600 mg elem calcium) tablet Take 1 tablet (600 mg of elem calcium total) by mouth daily.      carvedilol (COREG) 3.125 MG tablet Take 1 tablet (3.125 mg total) by mouth two (2) times a day. 180 tablet 0    cyanocobalamin 1000 MCG tablet Take 1 tablet (1,000 mcg total) by mouth daily.      escitalopram oxalate (LEXAPRO) 20 MG tablet Take 1 tablet (20 mg total) by mouth daily. TAKE 1 TABLET(20 MG) BY MOUTH DAILY 90 tablet 3    folic acid (FOLVITE) 1 MG tablet Take 1 tablet (1 mg total) by mouth daily. 90 tablet 1    lactulose (CHRONULAC) 10 gram/15 mL solution TAKE 15 ML BY MOUTH THREE TIMES DAILY, TAKE ENOUGH TO HAVE 3 BOWEL MOVEMENTS PER DAY (Patient taking differently: TAKE 15 ML BY MOUTH THREE TIMES DAILY, TAKE ENOUGH TO HAVE 3 BOWEL MOVEMENTS PER DAY- she takes this prn) 1350 mL 5    magnesium oxide (MAG-OX) 400 mg (241.3 mg elemental magnesium) tablet Take 1 tablet (400 mg total) by mouth two (2) times a day. 180 tablet 0    potassium chloride 20 MEQ ER tablet TAKE 1 TABLET(20 MEQ) BY MOUTH TWICE DAILY 180 tablet 1    vitamin E-268 mg, 400 UNIT, 268 mg (400 UNIT) capsule Take 1 capsule (268 mg total) by mouth daily. 2 tabs       No current facility-administered medications for this visit.       Allergies:  Allergies   Allergen Reactions    Sulfa (Sulfonamide Antibiotics) Anaphylaxis    Sulfur Anaphylaxis     Tolerated sulfur colloid injection without incident 09/03/2020.    Aspirin      thrombocytopenia       Family History: family history includes Heart disease in her mother; Lupus in her brother; No Known Problems in her daughter, father, maternal grandfather, maternal grandmother, paternal grandfather, paternal grandmother, and sister.    Social History:   Social History     Social History Narrative    Daughter fills  med boxes weekly.      Physical Examination:  Vital Signs: There were no vitals taken for this visit.  General: Healthy-appearing patient in no acute distress. Presents in hospital wheelchair.  HEENT: EOMI, sclerae clear.  All lower teeth are eroded, extensive dental caries.  Cardiovascular: Normal S1 and S2. RRR. No audible murmurs.  Respiratory: Chest clear to percussion and auscultation.   Gastrointestinal: Abdomen soft without masses and tenderness.  Musculoskeletal: No bony pain or tenderness.   Skin: no concerning lesions on the breasts/chest/axillae  Breasts: (R) breast: Benign. Well healed incision in UOQ. Healed incision in inferior aspect extending down to the inframammary fold. (L) breast: benign. No axillary adenopathy bilat.   Neurologic:  Alert and oriented. Grossly non-focal  Lymphatic: No cervical, axillary or supraclavicular adenopathy.   Extremity: No lower extremity edema or upper extremity lymphedema    DATA REVIEW:  Laboratory: I personally reviewed the pertinent laboratory data.  Radiology: I personally reviewed the pertinent imaging.  Pathology: I personally reviewed the pathology report.

## 2023-03-10 NOTE — Unmapped (Signed)
Abstraction Result Flowsheet Data    This patient's last AWV date: [facility] Last Medicare Wellness Visit Date: 07/18/2019  This patients last WCC/CPE date: : 11/28/2021      Reason for Encounter  Reason for Encounter: Outreach  Primary Reason for Outreach: AWV  Text Message: No  MyChart Message: Yes  Outreach Call Outcome: Call picked up with no response/line disconnected

## 2023-03-19 NOTE — Unmapped (Signed)
Abstraction Result Flowsheet Data    This patient's last AWV date: [facility] Last Medicare Wellness Visit Date: 07/18/2019  This patients last WCC/CPE date: : 11/28/2021      Reason for Encounter  Reason for Encounter: Outreach  Primary Reason for Outreach: AWV  Text Message: No  MyChart Message: No  Outreach Call Outcome: Left message

## 2023-03-30 NOTE — Unmapped (Signed)
Abstraction Result Flowsheet Data    This patient's last AWV date: [facility] Last Medicare Wellness Visit Date: 07/18/2019  This patients last WCC/CPE date: : 11/28/2021      Reason for Encounter  Reason for Encounter: Outreach  Primary Reason for Outreach: AWV  Text Message: No  MyChart Message: No  Outreach Call Outcome: Busy tone, no ringing or voicemail

## 2023-03-30 NOTE — Unmapped (Signed)
Copied from CRM #[id number]. Topic: Scheduling - New Appointment  >> Mar 30, 2023 10:06 AM Christie Beckers wrote:  Patient is asking for a call back to set up her AWV.

## 2023-03-31 NOTE — Unmapped (Signed)
Patient AWV scheduled for 11/18 at 9am @[facility] with [facility]    Abstraction Result Flowsheet Data    Reason for Encounter  Reason for Encounter: Outreach  Primary Reason for Outreach: AWV  Text Message: No  MyChart Message: No  Outreach Call Outcome: Scheduled Telephone

## 2023-04-02 NOTE — Unmapped (Signed)
Attempted to call pt and her daughter, [name], to follow up on whether they have obtained an appt with a dentist for dental evaluation prior to potentially starting a bone strengthener medication. LVM with call back info and sent detailed MyChart message to pt.

## 2023-04-06 NOTE — Unmapped (Signed)
2nd attempt to call pt/her daughter to follow up on whether pt was able to obtain an appt with a dentist for dental work evaluation prior to potentially starting a bone strengthener medication. Unable to LVM.

## 2023-04-20 ENCOUNTER — Institutional Professional Consult (permissible substitution): Admit: 2023-04-20 | Discharge: 2023-04-21 | Payer: MEDICARE

## 2023-04-20 DIAGNOSIS — Z0000 Encounter for general adult medical examination without abnormal findings: Principal | ICD-10-CM

## 2023-04-20 NOTE — Unmapped (Signed)
This auto-generated note displays some results identified during the AWV Assessments. For full results, please see the Flowsheet Links under the Additional Documentation section of this encounter in Chart Review.        The patient reports they are physically located in [place] and is currently: at home. I conducted a phone visit.  I spent 25 minutes on the phone call with the patient on the date of service .     Inform/Regulatory Low priority, no response needed unless change in care.     I am reaching out to [name] as part of the embedded care management team regarding her Annual Medicare Wellness visit.  Patient is at risk for BMI Abnormal and/or patient would benefit from meeting with an RD for Chronic Condition Management, Falls Risk, and Self-health.    Patient advised to Review AVS for education related to risk.Marland Kitchen      Next appt with PCP: none, but agrees to schedule     See chart for med list if needed    Sharing communication as part of regulatory requirements.   Location of Patient:[place]    Patient was unable to report the following vital signs today due to visit being performed by telehealth and patient lack of required equipment to obtain: Weight, Height, Blood Pressure , Pulse, and Temperature. Please see flowsheets for any vital signs that were reported this visit.      Patient reported Vital Signs:NOne        Social Determinants of Health:  Social Determinants of Health Screened today.  Interventions Provided: I provided an intervention for the Physical Activity SDOH domain. The intervention was Education    PCP notified of above risks by  Routing encounter    The following list of current providers and suppliers reviewed and updated this visit.  Patient Care Team:  Jenell Milliner, MD as PCP - General  Pearlstein, Maryjane Hurter, MD as Consulting Physician (Radiation Oncology)  Aris Everts, MD as Oncology Surgeon (Surgical Oncology)  Griselda Miner, FNP as Nurse Practitioner (Medical Oncology)  Delrae Rend, MD as Consulting Physician (Medical Oncology)  Jodie Echevaria, RN as Registered Nurse (Oncology Navigator)    Medications and supplements were reviewed and updated this visit. See medication list in encounter summary.     A personalized prevent plan was updated and reviewed with the patient. A copy has been provided to the patient in Patient Instructions.    Recent Hospitalizations reviewed:  No recent hospitalizations     General Health:  Patient answered Fair to oral health assessment  Has dental appt to have teeth pulled       Patient's BMI is 37.92 Patient answered No to nutrition services referral: Provided information in AVS [id number] for Older Adults        Pain identified during today's visit        04/20/23 0858   PainSc: 0-No pain            Safety:   The patient answered (!) Yes to falling in the last year, and No to feeling unsteady while standing or walking.  Falls Assessment Review    Fall Risk Assessment was positive.  Falls risk interventions taken today were:  Discussed interventions in home e.g. Shower chairs, grab bars, no loose rugs in the home  Medications were reviewed  Fall Prevention EDucation included in AVS            Patient answered that they had difficult or  needed help with the following Everyday Tasks:  None of the above (!) Medication Management (dtg helps with medication management)  Daughter provides assistance with medication management          Psychosocial Assessment: Patient lives with husband and daughter. Independent with ADLs. Does not exercise. Socially connected with family.    PHQ 2:  Patient had a PHQ 2 score of 0    PHQ 9: N/A    No intervention necessary Banker)    Social Drivers of Health     Food Insecurity: No Food Insecurity (04/20/2023)    Hunger Vital Sign     Worried About Running Out of Food in the Last Year: Never true     Ran Out of Food in the Last Year: Never true   Internet Connectivity: No Internet connectivity concern identified (04/20/2023)    Internet Connectivity     Do you have access to internet services: Yes     How do you connect to the internet: Personal Device at home     Is your internet connection strong enough for you to watch video on your device without major problems?: Yes     Do you have enough data to get through the month?: Yes     Does at least one of the devices have a camera that you can use for video chat?: Yes   Housing/Utilities: Low Risk  (04/20/2023)    Housing/Utilities     Within the past 12 months, have you ever stayed: outside, in a car, in a tent, in an overnight shelter, or temporarily in someone else's home (i.e. couch-surfing)?: No     Are you worried about losing your housing?: No     Within the past 12 months, have you been unable to get utilities (heat, electricity) when it was really needed?: No   Tobacco Use: Low Risk  (04/20/2023)    Patient History     Smoking Tobacco Use: Never     Smokeless Tobacco Use: Never     Passive Exposure: Past   Transportation Needs: No Transportation Needs (04/20/2023)    PRAPARE - Transportation     Lack of Transportation (Medical): No     Lack of Transportation (Non-Medical): No   Alcohol Use: Not At Risk (04/20/2023)    Alcohol Use     How often do you have a drink containing alcohol?: Never     How many drinks containing alcohol do you have on a typical day when you are drinking?: 0     How often do you have 5 or more drinks on one occasion?: Never   Interpersonal Safety: Not At Risk (04/20/2023)    Interpersonal Safety     Unsafe Where You Currently Live: No     Physically Hurt by Anyone: No     Abused by Anyone: No   Physical Activity: Inactive (04/20/2023)    Exercise Vital Sign     Days of Exercise per Week: 0 days     Minutes of Exercise per Session: 0 min   Intimate Partner Violence: Not At Risk (04/20/2023)    Humiliation, Afraid, Rape, and Kick questionnaire     Fear of Current or Ex-Partner: No     Emotionally Abused: No Physically Abused: No     Sexually Abused: No   Stress: No Stress Concern Present (04/20/2023)    [employer] of Occupational Health - Occupational Stress Questionnaire     Feeling of Stress : Only a little  Substance Use: Low Risk  (04/20/2023)    Substance Use     In the past year, how often have you used prescription drugs for non-medical reasons?: Never     In the past year, how often have you used illegal drugs?: Never     In the past year, have you used any substance for non-medical reasons?: No   Social Connections: Moderately Isolated (04/20/2023)    Social Connection and Isolation Panel [NHANES]     Frequency of Communication with Friends and Family: More than three times a week     Frequency of Social Gatherings with Friends and Family: More than three times a week     Attends Religious Services: Never     Database administrator or Organizations: No     Attends Banker Meetings: Never     Marital Status: Married   Programmer, applications: Low Risk  (04/20/2023)    Overall Financial Resource Strain (CARDIA)     Difficulty of Paying Living Expenses: Not hard at all   Depression: Not at risk (04/20/2023)    PHQ-2     PHQ-2 Score: 0   Health Literacy: Low Risk  (04/20/2023)    Health Literacy     : Never

## 2023-04-20 NOTE — Unmapped (Addendum)
Patient Education        Preventing Falls: Care Instructions  Injuries and health problems such as trouble walking or poor eyesight can increase your risk of falling. So can some medicines. But there are things you can do to help prevent falls. You can exercise to get stronger. You can also arrange your home to make it safer.    Talk to your doctor about the medicines you take. Ask if any of them increase the risk of falls and whether they can be changed or stopped.   Try to exercise regularly. It can help improve your strength and balance. This can help lower your risk of falling.         Practice fall safety and prevention.   Wear low-heeled shoes that fit well and give your feet good support. Talk to your doctor if you have foot problems that make this hard.  Carry a cellphone or wear a medical alert device that you can use to call for help.  Use stepladders instead of chairs to reach high objects. Don't climb if you're at risk for falls. Ask for help, if needed.  Wear the correct eyeglasses, if you need them.        Make your home safer.   Remove rugs, cords, clutter, and furniture from walkways.  Keep your house well lit. Use night-lights in hallways and bathrooms.  Install and use sturdy handrails on stairways.  Wear nonskid footwear, even inside. Don't walk barefoot or in socks without shoes.        Be safe outside.   Use handrails, curb cuts, and ramps whenever possible.  Keep your hands free by using a shoulder bag or backpack.  Try to walk in well-lit areas. Watch out for uneven ground, changes in pavement, and debris.  Be careful in the winter. Walk on the grass or gravel when sidewalks are slippery. Use de-icer on steps and walkways. Add non-slip devices to shoes.    Put grab bars and nonskid mats in your shower or tub and near the toilet. Try to use a shower chair or bath bench when bathing.   Get into a tub or shower by putting in your weaker leg first. Get out with your strong side first. Have a phone or medical alert device in the bathroom with you.   Where can you learn more?  Go to [facility] at [email or web address]/[email or web address] in the Menu. Enter G117 in the search box to learn more about Preventing Falls: Care Instructions.  Current as of: December 16, 2021  Content Version: 14.1  ?? 2006-2024 [employer].   Care instructions adapted under license by [facility]. If you have questions about a medical condition or this instruction, always ask your healthcare professional. Healthwise, Incorporated disclaims any warranty or liability for your use of this information.       Patient Education        Learning About Being Active as an Older Adult  Why is being active important as you get older?     Being active is one of the best things you can do for your health. And it's never too late to start. Being active--or getting active, if you aren't already--has definite benefits. It can:  Give you more energy,  Keep your mind sharp.  Improve balance to reduce your risk of falls.  Help you manage chronic illness with fewer medicines.  No matter how old you are, how fit you are, or what health  problems you have, there is a form of activity that will work for you. And the more physical activity you can do, the better your overall health will be.  What kinds of activity can help you stay healthy?  Being more active will make your daily activities easier. Physical activity includes planned exercise and things you do in daily life. There are four types of activity:  Aerobic.  Doing aerobic activity makes your heart and lungs strong.  Includes walking, dancing, and gardening.  Aim for at least 2?? hours spread throughout the week.  It improves your energy and can help you sleep better.  Muscle-strengthening.  This type of activity can help maintain muscle and strengthen bones.  Includes climbing stairs, using resistance bands, and lifting or carrying heavy loads.  Aim for at least twice a week.  It can help protect the knees and other joints.  Stretching.  Stretching gives you better range of motion in joints and muscles.  Includes upper arm stretches, calf stretches, and gentle yoga.  Aim for at least twice a week, preferably after your muscles are warmed up from other activities.  It can help you function better in daily life.  Balancing.  This helps you stay coordinated and have good posture.  Includes heel-to-toe walking, tai chi, and certain types of yoga.  Aim for at least 3 days a week.  It can reduce your risk of falling.  Even if you have a hard time meeting the recommendations, it's better to be more active than less active. All activity done in each category counts toward your weekly total. You'd be surprised how daily things like carrying groceries, keeping up with grandchildren, and taking the stairs can add up.  What keeps you from being active?  If you've had a hard time being more active, you're not alone. Maybe you remember being able to do more. Or maybe you've never thought of yourself as being active. It's frustrating when you can't do the things you want. Being more active can help. What's holding you back?  Getting started.  Have a goal, but break it into easy tasks. Small steps build into big accomplishments.  Staying motivated.  If you feel like skipping your activity, remember your goal. Maybe you want to move better and stay independent. Every activity gets you one step closer.  Not feeling your best.  Start with 5 minutes of an activity you enjoy. Prove to yourself you can do it. As you get comfortable, increase your time.  You may not be where you want to be. But you're in the process of getting there. Everyone starts somewhere.  How can you find safe ways to stay active?  Talk with your doctor about any physical challenges you're facing. Make a plan with your doctor if you have a health problem or aren't sure how to get started with activity.  If you're already active, ask your doctor if there is anything you should change to stay safe as your body and health change.  If you tend to feel dizzy after you take medicine, avoid activity at that time. Try being active before you take your medicine. This will reduce your risk of falls.  If you plan to be active at home, make sure to clear your space before you get started. Remove things like TV cords, coffee tables, and throw rugs. It's safest to have plenty of space to move freely.  The key to getting more active is to take it slow and  steady. Try to improve only a little bit at a time. Pick just one area to improve on at first. And if an activity hurts, stop and talk to your doctor.  Where can you learn more?  Go to [facility] at [email or web address] in the Menu. Enter P600 in the search box to learn more about Learning About Being Active as an Older Adult.  Current as of: November 04, 2021  Content Version: 14.1  ?? 2006-2024 [employer], [employer].   Care instructions adapted under license by [facility]. If you have questions about a medical condition or this instruction, always ask your healthcare professional. [employer], Inc[employer] disclaims any warranty or liability for your use of this information.       Patient Education        Well Visit, Over 46: Care Instructions  Well visits can help you stay healthy. Your doctor has checked your overall health and may have suggested ways to take good care of yourself. Your doctor also may have recommended tests. You can help prevent illness with healthy eating, good sleep, vaccinations, regular exercise, and other steps.    Get the tests that you and your doctor decide on. Depending on your age and risks, examples might include hearing tests as well as screening for colon, breast, and lung cancer. Screening helps find diseases before any symptoms appear.   Eat healthy foods. Choose fruits, vegetables, whole grains, lean protein, and low-fat dairy foods. Limit saturated fat, and reduce salt.     Limit alcohol. Men should have no more than 2 drinks a day. Women should have no more than 1. For some people, no alcohol is the best choice.   Exercise. It can help prevent falls. Get at least 30 minutes of exercise on most days of the week. Walking, yoga, and tai chi can be good choices.     Reach and stay at your healthy weight. This will lower your risk for many health problems.   Take care of your mental health. Try to stay connected with friends, family, and community, and find ways to manage stress.     If you're feeling depressed or hopeless, talk to someone. A counselor can help. If you don't have a counselor, talk to your doctor.   Talk to your doctor if you think you may have a problem with alcohol or drug use. This includes prescription medicines and illegal drugs.     Avoid tobacco and nicotine: Don't smoke, vape, or chew. If you need help quitting, talk to your doctor.   Practice safer sex. Getting tested, using condoms or dental dams, and limiting sex partners can help prevent STIs.     Make an advance directive. This is a legal way to tell your family and doctor what you want to happen at the end of your life or when you can't speak for yourself.   Prevent problems where you can. Protect your skin from too much sun, wash your hands, brush your teeth twice a day, and wear a seat belt in the car.   Where can you learn more?  Go to [facility] at [email or web address] logistics/support/[email or web address] officer in the Menu. Enter [phone] in the search box to learn more about Well Visit, Over 65: Care Instructions.  Current as of: January 05, 2022  Content Version: 14.1  ?? 2006-2024 [employer], [employer].   Care instructions adapted under license by [facility]. If you have questions about a medical condition or this instruction,  always ask your healthcare professional. Healthwise, Incorporated disclaims any warranty or liability for your use of this information.         Patient Education        Nutrition for Older Adults: Care Instructions  Overview     Good nutrition is important at any age. But it is especially important for older adults. Eating healthy foods helps keep your body strong. And it can help lower your risk for disease.  As you get older, your body needs more of certain nutrients. These include vitamin B12, calcium, and vitamin D. But it may be harder for you to get these and other important nutrients. This could be for many reasons. You may not feel as hungry as you used to. Or you could have problems with your teeth or mouth that make it hard to chew. Or you may not enjoy planning and preparing meals, especially if you live alone.  Talk with your doctor if you want help getting the most nutrition from what you eat. They may have you work with a dietitian to help you plan meals.  Follow-up care is a key part of your treatment and safety. Be sure to make and go to all appointments, and call your doctor if you are having problems. It's also a good idea to know your test results and keep a list of the medicines you take.  How can you care for yourself at home?  To stay healthy  Eat a variety of foods. The more you vary the foods you eat, the more vitamins, minerals, and other nutrients you get.  Ask your doctor if you should take a multivitamin. Choose one with about 100% of the daily value (DV) for vitamins and minerals. Do not take more than 100% of the daily value for any vitamin or mineral unless your doctor tells you to. Talk with your doctor if you are not sure which multivitamin is right for you.  Try to eat lots of fruits and vegetables. Fresh or frozen vegetables and fruits are healthy choices. Choose canned vegetables that have no salt added and fruits that are canned in their own juice or light syrup.  Include foods that are high in vitamin B12 in your diet. Good choices are fortified breakfast cereal, nonfat or low-fat milk and other dairy products, meat, poultry, fish, and eggs.  Get enough calcium and vitamin D. Good choices include nonfat or low-fat milk, cheese, and yogurt. Other good options are tofu, orange juice with added calcium, and some leafy green vegetables, such as collard greens and kale. If you don't use milk products, talk to your doctor about calcium and vitamin D supplements.  Try to eat protein foods every day. Good choices include lean meat, fish, poultry, eggs, and cheese. Other good options are cooked beans, peanut butter, and nuts and seeds.  Choose whole grains for half of the grains you eat. Look for 100% whole wheat bread, whole-grain cereals, brown rice, and other whole grains.  If you have constipation  Eat high-fiber foods every day if you can. These include fruits, vegetables, cooked dried beans, and whole grains.  Drink plenty of fluids. If you have kidney, heart, or liver disease and have to limit fluids, talk with your doctor before you increase the amount of fluids you drink.  Ask your doctor if stool softeners may help keep your bowels regular.  If you have mouth problems that make chewing hard  Pick canned or cooked fruits and vegetables. These are  often softer.  Chop or shred meat, poultry, and fish. Add sauce or gravy to the meat to help keep it moist.  Pick other protein foods that are soft. These include cheese, peanut butter, cooked beans, cottage cheese, and eggs.  If you have trouble shopping for yourself  Ask a local food store to deliver groceries to your home.  Contact your local area agency on aging and ask about resources that can help.  Ask a family member or neighbor to help you.  If you have trouble preparing meals  Try easier cooking methods such as using a slow cooker or microwave oven.  Let the grocery store do some of the work for you. Look for precut, washed, and ready-to-eat foods.  Take part in group meal programs. You can find these through senior citizen programs.  Have meals brought to your home. Your community may offer programs that deliver meals, such as Meals on Wheels. Or you could use an online meal delivery service.  If you are able, take a cooking class.  If your appetite is poor  Try to eat meals on a regular schedule. It may help to eat smaller meals more often throughout the day.  If you can, eat some meals with other people. You could ask family or friends to eat with you. Or you could take part in group meal programs offered in your community.  Ask your doctor if your medicines could cause appetite or taste problems. If so, ask about changing medicines.  Add spices and herbs to increase the flavor of food.  If you think you are depressed, ask your doctor for help. Depression can affect your appetite. And it can make it hard to do everyday activities like grocery shopping and making meals. Treatment can help.  When should you call for help?  Watch closely for changes in your health, and be sure to contact your doctor if you have any problems.  Where can you learn more?  Go to [facility] at [email or web address]/[email or web address] officer in the Menu. Enter [phone] in the search box to learn more about Nutrition for Older Adults: Care Instructions.  Current as of: February 24, 2022  Content Version: 14.1  ?? 2006-2024 [employer]wise, Inc[employer].   Care instructions adapted under license by [facility]. If you have questions about a medical condition or this instruction, always ask your healthcare professional. [employer], Inc[employer] disclaims any warranty or liability for your use of this information.         Here is your personalized prevention plan based on your Annual Wellness Visit today.    Medicare Screening & Prevention Guidelines Recommendations Dates Completed HM Status and Next Due Follow-Up   Colorectal Cancer Screening Patients 45 to 75: stool cards annually OR colonoscopy every 10 years (or more frequently if high risk) OR FIT-DNA every 3 years.  Colonoscopy date: Not Found  FOBT/FIT date: Not Found  Sigmoidoscopy date: Not Found  FIT-DNA date: Not Found Health Maintenance Summary    This patient has no relevant Health Maintenance data.      Not within age range   DEXA Bone Density Measurement Patients age 34-95 to have a Dexa every 5 years in postmenopausal women and high risk males. Males will defer to PCP. DEXA date: 11/27/2022 Health Maintenance Summary    -      Upcoming     DEXA Scan (Every 5 Years) Next due on 11/27/2027    11/27/2022  Dexa Bone Density Skeletal  04/18/2021  Dexa Bone Density Skeletal                   Up to date   Heart Disease Screening (fasting lipid panel) Minimum of every 5 years, patients age 42-75,  if no apparent signs or symptoms of heart disease. LDL date: 06/09/2022  Total choleseterol date: 06/09/2022  HDL date: 06/09/2022  Triglycerides date: 06/09/2022 Health Maintenance Summary    This patient has no relevant Health Maintenance data.      Up to date   Mammogram Screening Age 54-74 every 2 years.  Mammogram date: 07/17/2022 Health Maintenance Summary    This patient has no relevant Health Maintenance data.    Not within age range   Pelvic Exam & Pap Smear Women ages 66 to 77 every 3 years with negative cytology (pap smear)  OR,   women ages 68 to 74 every 5 years if they have had both a negative pap and human papillomavirus (HPV) OR,  Every 3 years if they had a positive HPV result Pap Smear date: Not Found  HPV date: Not Found Health Maintenance Summary    This patient has no relevant Health Maintenance data.    Not within age range   Hepatitis C Screening A one-time screening for HCV infection for adults age 15 to 76 years old. HCV screening date: 07/03/2017 Health Maintenance Summary    -      Completed or No Longer Recommended     Hepatitis C Screen  Completed    07/03/2017  Hepatitis C Ab component of Hepatitis C Antibody                   Complete   Covid-19 Vaccine For persons 5 and older Dose 1   Dose 2   Dose 3   Dose 4        Health Maintenance   Topic Date Due    COVID-19 Vaccine (1) Never done      Patient declined - other declines   Tdap Every 10 years (will not be covered by Medicare). Check with your pharmacy to see if you are eligible for this vaccine based on your insurance. DTap/Tdap/TD vaccination: Not Found Health Maintenance Summary    -      Current Care Gaps     DTaP/Tdap/Td Vaccines (1 - Tdap) Never done   No completion history exists for this topic.                 Patient declined - other declines   Influenza Vaccine Annually  Influenza Vaccination: Not Found   Health Maintenance Summary    -      Current Care Gaps     Influenza Vaccine (1) Overdue since 02/01/2023    03/07/2019  Imm Admin: Influenza Vaccine Quad (IIV4 PF) 32mo+ injectable    04/08/2018  Imm Admin: Influenza Vaccine Quad (IIV4 PF) 39mo+ injectable    07/03/2017  Imm Admin: Influenza Vaccine Quad (IIV4 PF) 58mo+ injectable    03/29/2012  Imm Admin: Influenza Virus Vaccine, unspecified formulation                   Patient declined - other declines   PneumococcalVaccine For people >=65, or with an underlying condition Pneumonia vaccination: 07/18/2019   Health Maintenance Summary    -      Completed or No Longer Recommended     Pneumococcal Vaccine 65+ (Series Information) Completed    07/18/2019  Imm  Admin: Pneumococcal Conjugate 13-Valent    07/03/2017  Imm Admin: PNEUMOCOCCAL POLYSACCHARIDE 23                   Complete   RSV Vaccine Current recommendations to have shared decision making with your PCP, please talk with your provider. @RULEVALUE ([phone]) Health Maintenance Summary    This patient has no relevant Health Maintenance data.    Defer to PCP   Zoster Vaccine Healthy adults 50 years and older receive 2 doses of recombinant zoster vaccine two to six months apart (may not be covered by Medicare). Check with your pharmacy to see if you are eligible for this vaccine based on your insurance. Zoster vaccination: 07/14/2018   Health Maintenance Summary    -      Current Care Gaps Zoster Vaccines (2 of 2) Overdue since 09/08/2018    07/14/2018  Imm Admin: SHINGRIX-ZOSTER VACCINE (HZV),   RECOMBINANT,SUB-UNIT,ADJUVANTED IM                 Will provide dates given at local pharmacy   Diabetes Screening  Annually for patients 40-70, if risk factors (family hx of DM, hx of gestational DM, and/or PCOS), twice per year if diagnosed with pre-diabetes.    Range 65-99 Diabetes screening date: Not Found N/A Not within age range       Thank you for completing your Medicare Annual Wellness Visit today. If you have any questions about your Medicare Annual Wellness Visit, please call our Registered Nurse Care Manager, Marlaine Hind, RN at [phone].      Phone Numbers for Appointment Scheduling  [facility] Mammogram [phone]  [facility] [address] GI Procedure office at [phone] for Colonoscopy   [facility] Radiology office at [phone] for DEXA  [facility] Main phone number [phone] for additional phone numbers

## 2023-04-20 NOTE — Unmapped (Signed)
ADVANCE CARE PLANNING NOTE    Discussion Date:  April 20, 2023    Patient has decisional capacity:  Yes    Patient has selected a Health Care Decision-Maker if loses capacity: Yes    Health Care Decision Maker as of 04/20/2023    HCDM (patient stated preference): Cocke,[name] - Spouse - [phone]    HCDM, First AlternateTequilla, Dugan Daughter - [phone]    Discussion Participants:  Daughter: Heather Roberts, Patient and Marlaine Hind, RN CM    Communication of Medical Status/Prognosis:   Patient reports overall health status as good.    Communication of Treatment Goals/Options:   Patient states Living Will and HCPOA are complete and agrees to provide copy to be scanned into chart.       Treatment Decisions:   04/20/2023    Ali Lowe, FNP was present and immediately available in office suite.    The patient reports they have a Healthcare power of attorney Living Will but have not brought a copy to the office.  Have discussed the importance of having this included in the medical record. .      Case Manager facilitated discussion with patient and daughter about advance care planning and documentation including Healthcare power of attorney Living Will importance of retaining copy on file within the medical record . The patient voluntarily agreed to bring to office Healthcare power of attorney Living Will.   Note: forms that require notarization require two witnessess that are non-family members nor health care workers.                I spent 2 minutes providing voluntary advance care planning services for this patient.

## 2023-04-20 NOTE — Unmapped (Signed)
Copied from CRM [id number]. Topic: Scheduling - New Appointment  >> Apr 20, 2023  9:53 AM April T wrote:  Caller asked to schedule a new appointment  Daughter  is requesting for the Nurse to contact them in regards to if she is able to take Motrin with all of her other medications   Please contact daughter by Cell Phone    Routine callback turnaround time: 24-48 business hours. Programmer, systems Notified)

## 2023-04-21 NOTE — Unmapped (Signed)
Spoke with daughter, has appt thurs . Will discuss ibuprofen use at this appt.

## 2023-04-23 ENCOUNTER — Ambulatory Visit
Admit: 2023-04-23 | Discharge: 2023-04-24 | Payer: MEDICARE | Attending: Hematology & Oncology | Primary: Hematology & Oncology

## 2023-04-23 DIAGNOSIS — M810 Age-related osteoporosis without current pathological fracture: Principal | ICD-10-CM

## 2023-04-23 DIAGNOSIS — Z13220 Encounter for screening for lipoid disorders: Principal | ICD-10-CM

## 2023-04-23 DIAGNOSIS — I10 Essential (primary) hypertension: Principal | ICD-10-CM

## 2023-04-23 DIAGNOSIS — E538 Deficiency of other specified B group vitamins: Principal | ICD-10-CM

## 2023-04-23 DIAGNOSIS — F418 Other specified anxiety disorders: Principal | ICD-10-CM

## 2023-04-23 DIAGNOSIS — Z0000 Encounter for general adult medical examination without abnormal findings: Principal | ICD-10-CM

## 2023-04-23 LAB — BASIC METABOLIC PANEL
ANION GAP: 4 mmol/L — ABNORMAL LOW (ref 5–14)
BLOOD UREA NITROGEN: 7 mg/dL — ABNORMAL LOW (ref 9–23)
BUN / CREAT RATIO: 12
CALCIUM: 9.9 mg/dL (ref 8.7–10.4)
CHLORIDE: 108 mmol/L — ABNORMAL HIGH (ref 98–107)
CO2: 29 mmol/L (ref 20.0–31.0)
CREATININE: 0.58 mg/dL (ref 0.55–1.02)
EGFR CKD-EPI (2021) FEMALE: >90 mL/min/{1.73_m2} (ref >=60)
GLUCOSE RANDOM: 99 mg/dL (ref 70–179)
POTASSIUM: 4.2 mmol/L (ref 3.5–5.1)
SODIUM: 141 mmol/L (ref 135–145)

## 2023-04-23 LAB — LIPID PANEL
CHOLESTEROL/HDL RATIO SCREEN: 2.9 (ref 1.0–4.5)
CHOLESTEROL: 186 mg/dL (ref <=200)
HDL CHOLESTEROL: 65 mg/dL — ABNORMAL HIGH (ref 40–60)
LDL CHOLESTEROL CALCULATED: 95 mg/dL (ref 40–99)
NON-HDL CHOLESTEROL: 121 mg/dL (ref 70–130)
TRIGLYCERIDES: 132 mg/dL (ref 0–150)
VLDL CHOLESTEROL CAL: 26.4 mg/dL (ref 11–41)

## 2023-04-23 LAB — CBC
HEMATOCRIT: 42.7 % (ref 34.0–44.0)
HEMOGLOBIN: 14.9 g/dL (ref 11.3–14.9)
MEAN CORPUSCULAR HEMOGLOBIN CONC: 34.9 g/dL (ref 32.0–36.0)
MEAN CORPUSCULAR HEMOGLOBIN: 34.4 pg — ABNORMAL HIGH (ref 25.9–32.4)
MEAN CORPUSCULAR VOLUME: 98.5 fL — ABNORMAL HIGH (ref 77.6–95.7)
MEAN PLATELET VOLUME: 7.9 fL (ref 6.8–10.7)
PLATELET COUNT: 167 10*9/L (ref 150–450)
RED BLOOD CELL COUNT: 4.34 10*12/L (ref 3.95–5.13)
RED CELL DISTRIBUTION WIDTH: 14.2 % (ref 12.2–15.2)
WBC ADJUSTED: 6 10*9/L (ref 3.6–11.2)

## 2023-04-23 LAB — VITAMIN B12: VITAMIN B-12: 1852 pg/mL — ABNORMAL HIGH (ref 211–911)

## 2023-04-23 NOTE — Unmapped (Signed)
Medicare Annual Wellness Visit    Risks identified  There were no significant safety risks identified at today's visit.  -- Weight concerns:  BMI > 30 (Obese): Patient referred for weight-loss counseling with nutritionist  Treatment options and their associated risks/benefits were reviewed with the patient.    End of Life Care Planning  Advance Directives- Patient has a living will    Personalized Prevention Plan  During the course of the visit the patient was educated and counseled about appropriate screening and preventive services.  A personalized prevention plan was reviewed with the patient and a written copy was provided for personal records (available for review in Patient Instructions).  The following screening tests were ordered today:    Health Maintenance-  Mammogram-NA  PSA screening-NA  BDS-NA  Colonoscopy-NA  Tdap vaccine-NA  Pneumovax-NA  Prevnar- NA  ShingRix-NA  Flu vaccine-deferred by patient   Eye exam-NA  Covid 19 vaccine-  Hep C screening-NA    PMH/HPI    No orders of the defined types were placed in this encounter.      Subjective:     [name] is a 77 y.o. female who presents for a Medicare Wellness Visit.      Medicare eligibility date:  12 years ago  Type of visit:  G0439 (SAWV) - > 12 months since last IPPE or AWV    Health Risk Assessment:  The patient's Health Risk Assessment forms were completed/reviewed.      Current Opioid use:no  Risk of Opioid medication reviewed:no  Pain control on current Opioid:no  Other pain medication options discussed:no  Follow up with Pain management if applicable:no    Comprehensive Medical History  Patient Active Problem List   Diagnosis    Alcoholic liver disease (CMS-HCC)    Hypokalemia    HTN (hypertension)    Chronic maxillary sinusitis    Alcoholism (CMS-HCC)    Encephalopathy    Depression with anxiety    Morbid (severe) obesity due to excess calories (CMS-HCC)    Macrocytosis    DJD (degenerative joint disease)    Malignant neoplasm of overlapping sites of right breast in female, estrogen receptor positive (CMS-HCC)    Liver lesion     Past Medical History:   Diagnosis Date    Alcoholism (CMS-HCC)     Alcoholism /alcohol abuse     Cirrhosis (CMS-HCC)     Depression     Hypertension     Liver disease     Malignant neoplasm of overlapping sites of right breast in female, estrogen receptor positive (CMS-HCC) 10/03/2020     Past Surgical History:   Procedure Laterality Date    BREAST BIOPSY Right     benign-a long time ago    BREAST BIOPSY Right 07/2020    malignant    BREAST LUMPECTOMY Right     4 2022    CHOLECYSTECTOMY      PR BX/REMV,LYMPH NODE,DEEP AXILL Right 09/03/2020    Procedure: BX/EXC LYMPH NODE; OPEN, DEEP AXILRY NODE;  Surgeon: Aris Everts, MD;  Location: [facility] OR [facility];  Service: Surgical Oncology Breast    PR INTRAOPERATIVE SENTINEL LYMPH NODE ID W DYE INJECTION Right 09/03/2020    Procedure: INTRAOPERATIVE IDENTIFICATION SENTINEL LYMPH NODE(S) INCLUDE INJECTION NON-RADIOACTIVE DYE, WHEN PERFORMED;  Surgeon: Aris Everts, MD;  Location: [facility];  Service: Surgical Oncology Breast    PR MASTECTOMY, PARTIAL Right 09/03/2020    Procedure: MASTECTOMY, PARTIAL (EG, LUMPECTOMY, TYLECTOMY, QUADRANTECTOMY, SEGMENTECTOMY);  Surgeon: Onalee Hua  Minus Breeding, MD;  Location: [facility] OR [facility];  Service: Surgical Oncology Breast    PR UPPER GI ENDOSCOPY,BIOPSY N/A 02/03/2018    Procedure: UGI ENDOSCOPY; WITH BIOPSY, SINGLE OR MULTIPLE;  Surgeon: Alfred Levins, MD;  [facility]: [facility];  Service: Gastroenterology    RADIATION Right     unsure finish date    TUBAL LIGATION       Family History   Problem Relation Age of Onset    Lupus Brother     Heart disease Mother     No Known Problems Father     No Known Problems Sister     No Known Problems Daughter     No Known Problems Maternal Grandmother     No Known Problems Maternal Grandfather     No Known Problems Paternal Grandmother     No Known Problems Paternal Grandfather BRCA 1/2 Neg Hx     Breast cancer Neg Hx     Cancer Neg Hx     Colon cancer Neg Hx     Endometrial cancer Neg Hx     Ovarian cancer Neg Hx     Mental illness Neg Hx     Substance Abuse Disorder Neg Hx      Allergies   Allergen Reactions    Sulfa (Sulfonamide Antibiotics) Anaphylaxis    Sulfur Anaphylaxis     Tolerated sulfur colloid injection without incident 09/03/2020.    Aspirin      thrombocytopenia     Current Outpatient Medications   Medication Sig Dispense Refill    acetaminophen (TYLENOL) 325 MG tablet Take 2 tablets (650 mg total) by mouth every six (6) hours as needed for pain.  0    amlodipine (NORVASC) 5 MG tablet Take 1 tablet (5 mg total) by mouth daily. TAKE 1 TABLET(5 MG) BY MOUTH DAILY 90 tablet 0    anastrozole (ARIMIDEX) 1 mg tablet TAKE 1 TABLET(1 MG) BY MOUTH DAILY 90 tablet 3    calcium carbonate 1,500 mg (600 mg elem calcium) tablet Take 1 tablet (600 mg elem calcium total) by mouth daily.      carvedilol (COREG) 3.125 MG tablet Take 1 tablet (3.125 mg total) by mouth two (2) times a day. 180 tablet 0    cyanocobalamin 1000 MCG tablet Take 1 tablet (1,000 mcg total) by mouth daily.      escitalopram oxalate (LEXAPRO) 20 MG tablet Take 1 tablet (20 mg total) by mouth daily. TAKE 1 TABLET(20 MG) BY MOUTH DAILY 90 tablet 3    folic acid (FOLVITE) 1 MG tablet Take 1 tablet (1 mg total) by mouth daily. 90 tablet 1    magnesium oxide (MAG-OX) 400 mg (241.3 mg elemental magnesium) tablet Take 1 tablet (400 mg total) by mouth two (2) times a day. 180 tablet 0    potassium chloride 20 MEQ ER tablet TAKE 1 TABLET(20 MEQ) BY MOUTH TWICE DAILY 180 tablet 1    vitamin E-268 mg, 400 UNIT, 268 mg (400 UNIT) capsule Take 1 capsule (268 mg total) by mouth daily. 2 tabs      lactulose (CHRONULAC) 10 gram/15 mL solution TAKE 15 ML BY MOUTH THREE TIMES DAILY, TAKE ENOUGH TO HAVE 3 BOWEL MOVEMENTS PER DAY (Patient not taking: Reported on 03/05/2023) 1350 mL 5    nystatin (MYCOSTATIN) 100,000 unit/gram powder Apply to affected area 3 times daily (Patient not taking: Reported on 04/23/2023) 15 g 0     No current facility-administered medications for this  visit.       Hospitalizations:  None    Current Providers:   Patient Care Team:  Jenell Milliner, MD as PCP - General  Pearlstein, Maryjane Hurter, MD as Consulting Physician (Radiation Oncology)  Aris Everts, MD as Oncology Surgeon (Surgical Oncology)  Griselda Miner, FNP as Nurse Practitioner (Medical Oncology)  Delrae Rend, MD as Consulting Physician (Medical Oncology)  Jodie Echevaria, RN as Registered Nurse (Oncology Navigator)    Other Specialists, Providers, Medical Suppliers:      Social History:   Occupation:  retired   Marital Status:  married   Lives with:  lives with their family   Diet:  Regular   Physical Activity:  Sedentary  Social History     Socioeconomic History    Marital status: Married   Tobacco Use    Smoking status: Never     Passive exposure: Past    Smokeless tobacco: Never   Vaping Use    Vaping status: Never Used   Substance and Sexual Activity    Alcohol use: Not Currently    Drug use: Never    Sexual activity: Not Currently   Social History Narrative    Daughter fills med boxes weekly.      Social Drivers of Psychologist, prison and probation services Strain: Low Risk  (04/20/2023)    Overall Financial Resource Strain (CARDIA)     Difficulty of Paying Living Expenses: Not hard at all   Food Insecurity: No Food Insecurity (04/20/2023)    Hunger Vital Sign     Worried About Running Out of Food in the Last Year: Never true     Ran Out of Food in the Last Year: Never true   Transportation Needs: No Transportation Needs (04/20/2023)    PRAPARE - Therapist, art (Medical): No     Lack of Transportation (Non-Medical): No   Physical Activity: Inactive (04/20/2023)    Exercise Vital Sign     Days of Exercise per Week: 0 days     Minutes of Exercise per Session: 0 min   Stress: No Stress Concern Present (04/20/2023) [employer] of Occupational Health - Occupational Stress Questionnaire     Feeling of Stress : Only a little   Social Connections: Moderately Isolated (04/20/2023)    Social Connection and Isolation Panel [NHANES]     Frequency of Communication with Friends and Family: More than three times a week     Frequency of Social Gatherings with Friends and Family: More than three times a week     Attends Religious Services: Never     Database administrator or Organizations: No     Attends Banker Meetings: Never     Marital Status: Married       Preventive Care:  Health Maintenance   Topic Date Due    COVID-19 Vaccine (1) Never done    DTaP/Tdap/Td Vaccines (1 - Tdap) Never done    Zoster Vaccines (2 of 2) 09/08/2018    Medicare Annual Wellness Visit (AWV)  08/16/2020    Influenza Vaccine (1) 02/01/2023    DEXA Scan  11/27/2027    Pneumococcal Vaccine 65+  Completed    Hepatitis C Screen  Completed     Immunization History   Administered Date(s) Administered    HEPATITIS B VACCINE ADULT,IM(ENERGIX B, RECOMBIVAX) 07/24/2017, 08/26/2017, 01/27/2018    Hepatitis A (Adult) 07/24/2017, 01/27/2018    Influenza Vaccine Quad(IM)6 MO-Adult(PF) 07/03/2017,  04/08/2018, 03/07/2019    Influenza Virus Vaccine, unspecified formulation 03/29/2012    PNEUMOCOCCAL POLYSACCHARIDE 23-VALENT 07/03/2017    Pneumococcal Conjugate 13-Valent 07/18/2019    SHINGRIX-ZOSTER VACCINE (HZV),RECOMBINANT,ADJUVANTED(IM) 07/14/2018       Depression Screen:  1.  Over the past two weeks, have you felt down, depressed or hopeless?  No  2.  Over the past two weeks, have you felt little interest or pleasure in doing things?  No    Safety Screen:  1.  Do you need help with the phone, transportation, shopping, preparing meals, housework, laundry, medications, or managing money?  No  2.  Does your home have rugs in the hallway, lack grab bars in the bathroom, lack handrails on the stairs, or have poor lighting?  No       Objective:     Blood pressure 130/76, pulse 76, temperature 36.4 ??C (97.6 ??F), temperature source Oral, weight 96.6 kg (213 lb), SpO2 97%.  Body mass index is 36.56 kg/m??.    Functional Ability:  Mobility Test : normal get up and go testing  Hearing Assessment: normal to finger rub testing  Memory Assessment: normal clock drawing with time and able to recall 3 words      Physical Exam:  Neck exam- no masses  Lung exam- clear to auscultation  Breast exam- S/p Right breast lumpectomy with well healed scar    Cardiac exam- RRR nl s1 and s2  abd exam- no HSM, non tender, no masses  Ext exam- no edema    Assessment and Plan-  AWV   Screening labs- CBC, CMP, lipids TSH  Referrals-none  Vaccines-patient declines all vaccines today until she gets her teeth fixed.   Follow up- 6 months or sooner if needed.

## 2023-04-23 NOTE — Unmapped (Signed)
Assessment and Plan:   Encounter for annual physical exam     Encounter for Medicare annual wellness exam  - see additional note      Benign essential HTN  - BP at goal  - Continue current tx:  amlodipine 5 mg po every day, Carvedilol 3.125 mg po BID.     Morbid (severe) obesity due to excess calories (CMS-HCC)  Patient is overweight with BMI 36 and we go over life style changes to reach and stay at a healthy weight. She was advised this will lower her risk for many problems, such as obesity, diabetes, heart disease, and high blood pressure. Get at least 30 minutes of physical activity on most days of the week. Walking is a good choice. You also may want to do other activities, such as running, swimming, cycling, or playing tennis or team sports. Discuss any changes in your exercise program with your doctor .    Osteoporosis, unspecified osteoporosis type, unspecified pathological fracture presence  Dexa 04/18/21 osteopenia; lowest T score -2.4,   DEXA 11/27/22 osteoporosis, lowest T score -2.9 femoral neck  --taking calcium/vit D  --at risk for falls,   03/05/23:started Aledronate.   - Next DEXA due 11/27/27.  Depression with anxiety  - Mood is stable  - Patient is currently not on antidepressant.     Lipid screening  - Lipid Panel  - Basic Metabolic Panel    B12 deficiency  - Vitamin B12 Level     HM:   - Next DEXA due 11/27/27.    Return in about 6 months (around 10/21/2023).  I personally spent 30 minutes face-to-face and non-face-to-face in the care of this patient, which includes all pre, intra, and post visit time on the date of service. All documented time was specific to the E/M visit and does not include any procedures that may have been performed.    Subjective:     HPI: [name] is a 77 y.o. female here for Annual exam, Subsequent Medicare wellness exam and follow up all chronic conditions. Patient is transferring from Dr Vinson Moselle and this is my first time seeing patient.     Hypertension:  Paitent denies chest pain, palpitations, sob or Le edema.  Patient is taking amlodipine 5 mg po every day, Carvedilol 3.125 mg po BID.      Right Breast Cancer:Per oncologist last seen 03/05/23 patient is NED. Patient is taking Arimidex 1 mgpo every day    Osteoporosis  Dexa 04/18/21 osteopenia; lowest T score -2.4,   DEXA 11/27/22 osteoporosis, lowest T score -2.9 femoral neck  --taking calcium/vit D  --at risk for falls,   03/05/23:started Aledronate.     B12 deficiency:   Patient is taking b12 1000 mcg po every day    Past Medical History:   Diagnosis Date    Alcoholism (CMS-HCC)     Alcoholism /alcohol abuse     Cirrhosis (CMS-HCC)     Depression     Hypertension     Liver disease     Malignant neoplasm of overlapping sites of right breast in female, estrogen receptor positive (CMS-HCC) 10/03/2020      Social History     Social History Narrative    Daughter fills med boxes weekly.      Tobacco Use: Low Risk  (04/20/2023)    Patient History     Smoking Tobacco Use: Never     Smokeless Tobacco Use: Never     Passive Exposure: Past  Health Maintenance Due   Topic Date Due    COVID-19 Vaccine (1) Never done    DTaP/Tdap/Td Vaccines (1 - Tdap) Never done    Zoster Vaccines (2 of 2) 09/08/2018    Medicare Annual Wellness Visit (AWV)  08/16/2020    Influenza Vaccine (1) 02/01/2023        ROS negative unless otherwise noted in HPI.    Allergies:     Sulfa (sulfonamide antibiotics), Sulfur, and Aspirin    Current Medications:     Current Outpatient Medications   Medication Sig Dispense Refill    acetaminophen (TYLENOL) 325 MG tablet Take 2 tablets (650 mg total) by mouth every six (6) hours as needed for pain.  0    amlodipine (NORVASC) 5 MG tablet Take 1 tablet (5 mg total) by mouth daily. TAKE 1 TABLET(5 MG) BY MOUTH DAILY 90 tablet 0    anastrozole (ARIMIDEX) 1 mg tablet TAKE 1 TABLET(1 MG) BY MOUTH DAILY 90 tablet 3    calcium carbonate 1,500 mg (600 mg elem calcium) tablet Take 1 tablet (600 mg elem calcium total) by mouth daily. carvedilol (COREG) 3.125 MG tablet Take 1 tablet (3.125 mg total) by mouth two (2) times a day. 180 tablet 0    cyanocobalamin 1000 MCG tablet Take 1 tablet (1,000 mcg total) by mouth daily.      escitalopram oxalate (LEXAPRO) 20 MG tablet Take 1 tablet (20 mg total) by mouth daily. TAKE 1 TABLET(20 MG) BY MOUTH DAILY 90 tablet 3    folic acid (FOLVITE) 1 MG tablet Take 1 tablet (1 mg total) by mouth daily. 90 tablet 1    magnesium oxide (MAG-OX) 400 mg (241.3 mg elemental magnesium) tablet Take 1 tablet (400 mg total) by mouth two (2) times a day. 180 tablet 0    potassium chloride 20 MEQ ER tablet TAKE 1 TABLET(20 MEQ) BY MOUTH TWICE DAILY 180 tablet 1    vitamin E-268 mg, 400 UNIT, 268 mg (400 UNIT) capsule Take 1 capsule (268 mg total) by mouth daily. 2 tabs      lactulose (CHRONULAC) 10 gram/15 mL solution TAKE 15 ML BY MOUTH THREE TIMES DAILY, TAKE ENOUGH TO HAVE 3 BOWEL MOVEMENTS PER DAY (Patient not taking: Reported on 03/05/2023) 1350 mL 5    nystatin (MYCOSTATIN) 100,000 unit/gram powder Apply to affected area 3 times daily (Patient not taking: Reported on 04/23/2023) 15 g 0     No current facility-administered medications for this visit.       Objective:     Vitals:    04/23/23 1134   BP: 130/76   Pulse: 76   Temp: 36.4 ??C (97.6 ??F)   SpO2: 97%     Body mass index is 36.56 kg/m??.    Physical Exam  Constitutional:       Appearance: Normal appearance. She is obese.   HENT:      Head: Normocephalic and atraumatic.      Right Ear: Tympanic membrane, ear canal and external ear normal.      Left Ear: Tympanic membrane, ear canal and external ear normal.      Nose: Nose normal.      Mouth/Throat:      Mouth: Mucous membranes are moist.      Pharynx: Oropharynx is clear.   Eyes:      Extraocular Movements: Extraocular movements intact.      Conjunctiva/sclera: Conjunctivae normal.      Pupils: Pupils are equal, round, and reactive to light.  Cardiovascular:      Rate and Rhythm: Normal rate and regular rhythm.      Heart sounds: Normal heart sounds.   Pulmonary:      Effort: Pulmonary effort is normal.      Breath sounds: Normal breath sounds.   Chest:      Comments: Breast: S/p right breast lumpectomy with well healed scar  Abdominal:      General: Abdomen is flat. Bowel sounds are normal.      Palpations: Abdomen is soft.   Musculoskeletal:         General: Normal range of motion.      Cervical back: Normal range of motion and neck supple.   Skin:     General: Skin is warm and dry.      Capillary Refill: Capillary refill takes less than 2 seconds.   Neurological:      General: No focal deficit present.      Mental Status: She is alert and oriented to person, place, and time. Mental status is at baseline.   Psychiatric:         Mood and Affect: Mood normal.         Behavior: Behavior normal.         Thought Content: Thought content normal.         Judgment: Judgment normal.          No results found for this visit on 04/23/23.    Legrand Pitts, M.D.    Internal Medicine  [facility]  [address] [place], [place] [address]

## 2023-05-04 DIAGNOSIS — K766 Portal hypertension: Principal | ICD-10-CM

## 2023-05-04 DIAGNOSIS — I10 Essential (primary) hypertension: Principal | ICD-10-CM

## 2023-05-04 MED ORDER — AMLODIPINE 5 MG TABLET
ORAL_TABLET | Freq: Every day | ORAL | 0 refills | 90 days | Status: CP
Start: 2023-05-04 — End: ?

## 2023-05-04 MED ORDER — CARVEDILOL 3.125 MG TABLET
ORAL_TABLET | 0 refills | 0 days | Status: CP
Start: 2023-05-04 — End: ?

## 2023-05-29 MED ORDER — MAGNESIUM OXIDE 400 MG (241.3 MG MAGNESIUM) TABLET
ORAL_TABLET | Freq: Two times a day (BID) | ORAL | 3 refills | 90.00 days | Status: CP
Start: 2023-05-29 — End: 2024-05-28

## 2023-07-20 ENCOUNTER — Inpatient Hospital Stay: Admit: 2023-07-20 | Discharge: 2023-07-20 | Payer: MEDICARE

## 2023-07-20 ENCOUNTER — Ambulatory Visit: Admit: 2023-07-20 | Payer: MEDICARE | Attending: Radiation Oncology | Primary: Radiation Oncology

## 2023-07-20 DIAGNOSIS — C50811 Malignant neoplasm of overlapping sites of right female breast: Principal | ICD-10-CM

## 2023-07-20 DIAGNOSIS — Z1231 Encounter for screening mammogram for malignant neoplasm of breast: Principal | ICD-10-CM

## 2023-07-20 DIAGNOSIS — Z170 Estrogen receptor positive status [ER+]: Principal | ICD-10-CM

## 2023-07-20 NOTE — Unmapped (Signed)
 [name] here for follow-up with Dr. Carles Collet.    Skin: Intact  ROM: WNL  Pain/Swelling/Lymphedema: No pain or edema  Energy Level/Fatigue/Active Level: Sleepy today  Elimination: No issues  Appetite/Diet: Appetite  Nausea/Vomitting: None     Last Mammogram: Mammo - today, 07/20/23; bone density - June 2024; pt does do self breast exams  Chemo: NA  Weight: Stable  Tamoxifen/Letrozole/Aromasin (hot flashes, joint pain, depression): Anastrozole - no issues  Psychosocial: No needs  Refills of Meds: None    Notified Dr. Carles Collet.

## 2023-07-20 NOTE — Unmapped (Signed)
 RADIATION ONCOLOGY FOLLOW-UP VISIT NOTE     Encounter Date: 07/20/2023  Patient Name: [name]  Medical Record Number: [id number]    DIAGNOSIS:  [name] is a 78yo with a pT1cN1 R breast IDC (ER/PR pos, HER2 neg) s/p partial mastectomy/SLN (1.7cm, grade 2, no LVI, 1/1 SLN involved).  She received adjuvant radiation to breast/regional LN (60Gy finished 01/09/2021)    DURATION SINCE COMPLETION OF RADIOTHERAPY:  2 years, 6 months (01/09/2021)    ASSESSMENT:  Disease Status: No evidence of disease on mammogram or exam today    RECOMMENDATIONS:  FOLLOW-UP:  Med onc in 3 months.  I'll tentatively see back in 1 year with mammogram  Skin:  She had significant skin reaction during treatment but has healed well- mild hyperpigmentation but no other issues  Endocrine:  Continue anastrazole  Surveillance:  Mammo due 07/2024- ordered today    INTERVAL HISTORY:  Feeling well today without any major issues to discuss.  No new breast-related issues including pain, palpable masses, skin changes.  No breast/arm swelling.  No ROM limitations.  Still not very active throughout the day but energy is fairly stable.  Tolerating anastrazole well- her daughter prepares her pills so she wasn't even aware if she was still taking it or not.  She had her teeth pulled since she was last seen in clinic- she was told by her dentist it was from her cancer treatments.  Her husband is having a rough few weeks- hospitalized recently for AMS with some electrolyte issues.    REVIEW OF SYSTEMS:  A comprehensive review of 10 systems was negative except for pertinent positives noted in HPI.    PAST MEDICAL HISTORY/FAMILY HISTORY/SOCIAL HISTORY:  Reviewed in EPIC    ALLERGIES/MEDICATIONS:  Reviewed in EPIC    PHYSICAL EXAM:  Vital Signs for this encounter:   BP 140/69  - Pulse 79  - Temp 36.4 ??C (97.5 ??F) (Temporal)  - Resp 18  - SpO2 98%   Karnofsky/Lansky Performance Status: 60, Requires occasional assistance, but is able to care for most of his personal needs (ECOG equivalent 2)  General:   No acute distress, alert and oriented X 4   Head: Normocephalic, without obvious abnormality, atraumatic   Eyes: EOMI, no scleral icterus  Lungs:Normal work of breathing  Heart: RRR, S1/S2 normal, no murmur/rub/gallop  Extremities: Extremities normal, atraumatic.  No edema in bilateral upper extremities.  Lymph nodes: No palpable axillary or supraclavicular lymphadenopathy  Neurologic: Grossly normal  Breast:  Bilateral breast exam performed.  Mild hyperpigmentation over R breast, but no palpable mass in the R breast.  No palpable mass in the L breast, no overlying skin changes    RADIOLOGY:  Mammo 07/17/2022  At the lumpectomy site in the right breast, there is expected density and architectural distortion. There are no suspicious calcifications, dominant masses or unexplained areas of architectural distortion in either breast. There is no mammographic evidence of malignancy. No significant change from prior studies. BIRADS 2    Labs:    No results found for: WBC, HGB, HCT, PLT, LDH, CREATININE, AST, ALT, MG    Rayetta Humphrey, MD  Assistant Professor  [facility] Dept of Radiation Oncology  07/20/2023

## 2023-08-06 DIAGNOSIS — K766 Portal hypertension: Principal | ICD-10-CM

## 2023-08-06 DIAGNOSIS — I10 Essential (primary) hypertension: Principal | ICD-10-CM

## 2023-08-06 MED ORDER — FOLIC ACID 1 MG TABLET
ORAL_TABLET | Freq: Every day | ORAL | 2 refills | 90.00 days | Status: CP
Start: 2023-08-06 — End: 2024-08-05

## 2023-08-06 MED ORDER — CARVEDILOL 3.125 MG TABLET
ORAL_TABLET | Freq: Two times a day (BID) | ORAL | 2 refills | 90.00 days | Status: CP
Start: 2023-08-06 — End: 2024-08-05

## 2023-08-06 MED ORDER — AMLODIPINE 5 MG TABLET
ORAL_TABLET | Freq: Every day | ORAL | 2 refills | 90 days | Status: CP
Start: 2023-08-06 — End: 2024-08-05

## 2023-08-06 NOTE — Unmapped (Deleted)
 Patient is requesting the following refill  Requested Prescriptions     Pending Prescriptions Disp Refills    carvedilol (COREG) 3.125 MG tablet 180 tablet 0     Sig: Take 1 tablet (3.125 mg total) by mouth two (2) times a day.       Recent Visits  Date Type Provider Dept   04/23/23 Office Visit Tessie Eke, MD [facility] [address] [facility]   Showing recent visits within past 365 days and meeting all other requirements  Future Appointments  Date Type Provider Dept   10/21/23 Appointment Tessie Eke, MD [facility] [address] [facility]   Showing future appointments within next 365 days and meeting all other requirements       Labs: Vitals:   BP Readings from Last 3 Encounters:   07/20/23 140/69   04/23/23 130/76   03/05/23 138/82    and   Pulse Readings from Last 3 Encounters:   07/20/23 79   04/23/23 76   03/05/23 87

## 2023-08-06 NOTE — Unmapped (Signed)
 Patient is requesting the following refill  Requested Prescriptions     Pending Prescriptions Disp Refills    carvedilol (COREG) 3.125 MG tablet 180 tablet 0     Sig: Take 1 tablet (3.125 mg total) by mouth two (2) times a day.    amlodipine (NORVASC) 5 MG tablet 90 tablet 0     Sig: Take 1 tablet (5 mg total) by mouth daily. TAKE 1 TABLET(5 MG) BY MOUTH DAILY    folic acid (FOLVITE) 1 MG tablet 90 tablet 1     Sig: Take 1 tablet (1 mg total) by mouth daily.       Recent Visits  Date Type Provider Dept   04/23/23 Office Visit Tessie Eke, MD [facility] [address] [facility]   Showing recent visits within past 365 days and meeting all other requirements  Future Appointments  Date Type Provider Dept   10/21/23 Appointment Tessie Eke, MD [facility] [address] [facility]   Showing future appointments within next 365 days and meeting all other requirements       Labs: Vitals:   BP Readings from Last 3 Encounters:   07/20/23 140/69   04/23/23 130/76   03/05/23 138/82    and   Pulse Readings from Last 3 Encounters:   07/20/23 79   04/23/23 76   03/05/23 87

## 2023-09-02 NOTE — Unmapped (Unsigned)
 BREAST MEDICAL ONCOLOGY ENCOUNTER  -----------------------------------------------------------------------------------------------------------------  Referring Physician: Griselda Miner, FNP  PCP: Tessie Eke, MD  Consulting Physicians: Surgical oncology: Lucretia Roers, MD.  Radiation oncology: Rayetta Humphrey, MD.     Reason for Visit: The patient is seen in consultation at the request of Dr. Griselda Miner for evaluation of breast cancer.  -----------------------------------------------------------------------------------------------------  Assessment: [name] is a 78 y.o. female from [facility] with screening detected (R) HR+/HER2 low (2+) IDC, pT1c pN1a, G2, 1.7 cm. S/p right partial mastectomy 09/03/20 and radiation 01/09/21. Oncotype testing 8, no adjuvant chemotherapy was recommended. She completed radiation and subsequently started anastrozole on 01/23/21.     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 We have discussed the increased risk for hip fracture and the need to prevent falls.    Bone-directed therapy is complicated by very poor dentition and that she likely needs dental extractions/dentures and does not currently have a dentist  She is not a good candidate to change to tamoxifen, given liver disease and cirrhosis.  Although the risk of ONJ is very low for oral bisphosphonates, it may be reasonable to delay the start of an oral bisphosphonate until she has a dental evaluation and a plan.  Her daughter will assist in making an appointment and will keep [place] posted.    Plan  1.  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   Chemotherapy: Oncotype score: 8, no adjuvant chemo   Radiation: s/p whole breast radiation and tumor bed boost, completed 01/09/21  Endocrine therapy: Plan for 5-10 years of AI. Started anastrozole 01/23/21.  Bone health: see below  Breast imaging: MMG on 07/2022, BI-RADS 2, repeat in 1 year  Systemic staging: PET from 10/25/20 negative for evidence of metastatic disease. Following w/PCP for GI findings  Genetics: None at this time.     2. Osteoporosis  Dexa 04/18/21 osteopenia; lowest T score -2.4,   DEXA 11/27/22 osteoporosis, lowest T score -2.9 femoral neck  --taking calcium/vit D  --at risk for falls, 03/05/23: plan alendronate although needs dental extractions, will delay starting until extractions are complete if done reasonably soon, otherwise may go ahead and start treatment    3.  Supportive care  -- Hx of liver disease. Takes lactulose 3x daily. Follows with Dr. Raford Pitcher (? - no notes in Epic).   -- Hot flashes: Vitamin E, 400 IU daily.   -- Yeast intertrigo, inframammary folds: Nystatin powder BID, keep skin dry.      3. Follow-up  --make dental appt asap & consider starting alendronate before next visit - we will rech out to dtr [name] in 1 month to check in   --make routine PCP appt given multiple comorbidities  --continue anastrozole  -- return to Dr. Carles Collet with MMG in 07/2023  -- RTC Prisca Gearing/[place] in 6 months    ** Prefers [place] **     Griselda Miner, NP  [facility] Breast Medical Oncology    I personally spent 40 minutes face-to-face and non-face-to-face in the care of this patient, which includes all pre, intra, and post visit time on the date of service.  All documented time was specific to the E/M visit and does not include any procedures that may have been performed.    -----------------------------------------------------------------------------------------------------  Interval history 09/03/2023:   Patient presenting for followup with her daughter [name].  Adherent to anastrozole.  We have reviewed the last DEXA which shows progression of bone loss to osteoporosis in the femoral neck.    We didn't start bone directed therapy this summer due to the need for dental work and breaking teeth. Reports that she had good dental health until 2022 when she started to notice gray and fracturing teeth even when eating soft foods. Hasn't been to the dentist.   Taking calcium with D3.   Her PCP Dr. Vinson Moselle retired. She is considering who to see in the same [facility] practice.   She remains cautious about falls.  She presents to clinic in a wheelchair today.  Her last fall was significant, in May 2024, when she fell down several stairs.  No fractures.  Had postural dizziness that prompted the Fall.   Lives with her husband, daughter and 14yo granddaughter. Her daughter manages all of her medications for her.   Right knee and intermittent lower back pain. Chronic and unchanged.    Review of Systems: A complete twelve systems review was obtained and is positive per the HPI but otherwise negative in detail. See MIMS #1170 where available.    Functional Status: ECOG PS 0. ADLS independent. IADLS independent. Uses wheelchair for long walks. Cognition intact.     Social: Lives in [place], [place] with husband, daughter [name], and her granddaughter. Has another daughter [name], three grandchildren, and a brother nearby. Married for 50 years. Retired. 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     History of the Present Illness: [name] is a pleasant 78 y.o. female who  has a past medical history of Alcoholism, Alcoholism /alcohol abuse, Cirrhosis, Depression, Hypertension, Liver disease, and Malignant neoplasm of overlapping sites of right breast in female, estrogen receptor positive (10/03/2020). She is seen in consultation at the request of Dr. Griselda Miner for evaluation of breast cancer. She had an abnormal screening MMG 06/20/20. She had diagnostic MMG/US of right breast 07/23/20 with 0.7 x 0.6 x 0.6 cm irregular hypoechoic nonparallel mass with indistinct margins and peripheral vascularity at the 5:00 position approximately 8 CFN. Core bx of right breast 07/30/20 with IDC, G2, ER+(100%), PR+(100%), and HER-2 negative (2+ by IHC, FISH not amplified). She underwent right partial mastectomy 09/03/20. Final path: IDC, 1.7 cm in greatest dimension, G2, associated DCIS. Negative margins. 1/1 LN positive for carcinoma.     Hematology/Oncology History Overview Note   Oncotype?     Malignant neoplasm of overlapping sites of right breast in female, estrogen receptor positive   07/30/2020 Biopsy    A: Breast, right, core biopsy  - Invasive ductal carcinoma (see comment)  - Nottingham combined histologic grade: 2               Tubule formation: 3               Nuclear grade: 2  Mitotic score: 1  - Invasive carcinoma measures approximately 3.5 mm in this specimen  - Solid papillary ductal carcinoma in situ  - Ancillary studies (see biomarker synoptic)               Estrogen receptor: Positive (100%)               Progesterone receptor: Positive (100%)               HER2 IHC: Equivocal (2+)  HER2 FISH: Insufficient cellularity  09/03/2020 Surgery    A.  Breast, right, partial mastectomy  - Invasive ductal carcinoma (see synoptic report)  - Tumor size: 17 mm in greatest dimension  - Ductal carcinoma in situ (DCIS), grade 2, solid papillary and cribriform types  - Size extent of DCIS: At least 55 mm   - Margin status for specimen A (see extended margins below)          Invasive carcinoma: Negative, <1 mm from anterior and inferior margins          Ductal carcinoma in situ: Negative, <1 mm from superior margin  - Ancillary studies reported on prior core biopsy [id number]          Estrogen receptor: Positive (100%)          Progesterone receptor: Positive (100%)          HER2 IHC: Equivocal (2+)          HER2 FISH: Insufficient cellularity (see comment)     B.  Breast, right, superior margin,excision  - Single microscopic focus of ductal carcinoma in situ  - Margin: Negative, <1 mm    C.  Breast, right, lateral margin, excision  - Negative for carcinoma    D.  Breast, right,  inferior margin, excision  - Negative for carcinoma    E.  Breast, right, medial margin, excision  - Negative for carcinoma     F. Right axilla, sentinel lymph node, biopsy  - One lymph node positive for carcinoma (1/1)     09/03/2020 -  Cancer Staged    Staging form: Breast, AJCC 8th Edition  - Pathologic stage from 09/03/2020: Stage IA (pT1c, pN1a, cM0, G2, ER+, PR+, HER2-, Oncotype DX score: 8) - Signed by Marcy Panning, FNP on 11/06/2021       11/08/2020 -  Radiation    Radiation Therapy Treatment Details (Noted on 11/08/2020)  Site: Right Breast  Technique: 3D CRT  Goal: No goal specified  Planned Treatment Start Date: No planned start date specified         Patient Active Problem List   Diagnosis    Alcoholic liver disease    Hypokalemia    HTN (hypertension)    Chronic maxillary sinusitis    Alcoholism    Encephalopathy    Depression with anxiety    Morbid (severe) obesity due to excess calories (CMS-HCC)    Macrocytosis    DJD (degenerative joint disease)    Malignant neoplasm of overlapping sites of right breast in female, estrogen receptor positive    Liver lesion       Past Medical History:   Diagnosis Date    Alcoholism     Alcoholism /alcohol abuse     Cirrhosis     Depression     Hypertension     Liver disease     Malignant neoplasm of overlapping sites of right breast in female, estrogen receptor positive 10/03/2020       Past Surgical History:   Procedure Laterality Date    BREAST BIOPSY Right     benign-a long time ago    BREAST BIOPSY Right 07/2020    malignant    BREAST LUMPECTOMY Right     4 2022    CHOLECYSTECTOMY      PR BX/REMV,LYMPH NODE,DEEP AXILL Right 09/03/2020    Procedure: BX/EXC LYMPH NODE; OPEN, DEEP AXILRY NODE;  Surgeon: Aris Everts, MD;  Location: ASC OR [facility];  Service: Surgical Oncology Breast    PR  INTRAOPERATIVE SENTINEL LYMPH NODE ID W DYE INJECTION Right 09/03/2020    Procedure: INTRAOPERATIVE IDENTIFICATION SENTINEL LYMPH NODE(S) INCLUDE INJECTION NON-RADIOACTIVE DYE, WHEN PERFORMED;  Surgeon: Aris Everts, MD;  Location: [facility] OR [facility];  Service: Surgical Oncology Breast    PR MASTECTOMY, PARTIAL Right 09/03/2020    Procedure: MASTECTOMY, PARTIAL (EG, LUMPECTOMY, TYLECTOMY, QUADRANTECTOMY, SEGMENTECTOMY);  Surgeon: Aris Everts, MD;  Location: ASC OR [facility];  Service: Surgical Oncology Breast    PR UPPER GI ENDOSCOPY,BIOPSY N/A 02/03/2018    Procedure: UGI ENDOSCOPY; WITH BIOPSY, SINGLE OR MULTIPLE;  Surgeon: Alfred Levins, MD;  Location: [facility] PROCEDURES [facility];  Service: Gastroenterology    RADIATION Right     unsure finish date    TUBAL LIGATION       Gyn History: G2P2. Menopause in her 78s. Denies HRT.     Medications:  Current Outpatient Medications   Medication Sig Dispense Refill    acetaminophen (TYLENOL) 325 MG tablet Take 2 tablets (650 mg total) by mouth every six (6) hours as needed for pain.  0    amlodipine (NORVASC) 5 MG tablet Take 1 tablet (5 mg total) by mouth daily. TAKE 1 TABLET(5 MG) BY MOUTH DAILY 90 tablet 2    anastrozole (ARIMIDEX) 1 mg tablet TAKE 1 TABLET(1 MG) BY MOUTH DAILY 90 tablet 3    calcium carbonate 1,500 mg (600 mg elem calcium) tablet Take 1 tablet (600 mg elem calcium total) by mouth daily.      carvedilol (COREG) 3.125 MG tablet Take 1 tablet (3.125 mg total) by mouth two (2) times a day. 180 tablet 2    cyanocobalamin 1000 MCG tablet Take 1 tablet (1,000 mcg total) by mouth daily.      folic acid (FOLVITE) 1 MG tablet Take 1 tablet (1 mg total) by mouth daily. 90 tablet 2    lactulose (CHRONULAC) 10 gram/15 mL solution TAKE 15 ML BY MOUTH THREE TIMES DAILY, TAKE ENOUGH TO HAVE 3 BOWEL MOVEMENTS PER DAY (Patient not taking: Reported on 07/20/2023) 1350 mL 5    magnesium oxide (MAG-OX) 400 mg (241.3 mg elemental magnesium) tablet Take 1 tablet (400 mg total) by mouth two (2) times a day. 180 tablet 3    nystatin (MYCOSTATIN) 100,000 unit/gram powder Apply to affected area 3 times daily 15 g 0    potassium chloride 20 MEQ ER tablet TAKE 1 TABLET(20 MEQ) BY MOUTH TWICE DAILY 180 tablet 1    vitamin E-268 mg, 400 UNIT, 268 mg (400 UNIT) capsule Take 1 capsule (268 mg total) by mouth daily. 2 tabs       No current facility-administered medications for this visit.       Allergies:  Allergies   Allergen Reactions    Sulfa (Sulfonamide Antibiotics) Anaphylaxis    Sulfur Anaphylaxis     Tolerated sulfur colloid injection without incident 09/03/2020.    Aspirin      thrombocytopenia       Family History: family history includes Heart disease in her mother; Lupus in her brother; No Known Problems in her daughter, father, maternal grandfather, maternal grandmother, paternal grandfather, paternal grandmother, sister, and another family member.    Social History:   Social History     Social History Narrative    Daughter fills med boxes weekly.      Physical Examination:  Vital Signs: There were no vitals taken for this visit.  General: Healthy-appearing patient in no acute distress. Presents in hospital wheelchair.  HEENT: EOMI,  sclerae clear.  All lower teeth are eroded, extensive dental caries.  Cardiovascular: Normal S1 and S2. RRR. No audible murmurs.  Respiratory: Chest clear to percussion and auscultation.   Gastrointestinal: Abdomen soft without masses and tenderness.  Musculoskeletal: No bony pain or tenderness.   Skin: no concerning lesions on the breasts/chest/axillae  Breasts: (R) breast: Benign. Well healed incision in UOQ. Healed incision in inferior aspect extending down to the inframammary fold. (L) breast: benign. No axillary adenopathy bilat.   Neurologic:  Alert and oriented. Grossly non-focal  Lymphatic: No cervical, axillary or supraclavicular adenopathy.   Extremity: No lower extremity edema or upper extremity lymphedema    DATA REVIEW:  Laboratory: I personally reviewed the pertinent laboratory data.  Radiology: I personally reviewed the pertinent imaging.  Pathology: I personally reviewed the pathology report.

## 2023-09-03 NOTE — Unmapped (Signed)
 [facility] Other Call ONC Phone Room Smart Lists: Cancellation/Reschedule -     Hi,    Patient [name] contacted the Communication Center to reschedule their appointment for today.  The original appointment has been cancelled.    Cancellation Reason: Transportation     Patient has been rescheduled for 4/17.    Thank you,  Yehuda Mao  [facility]   [phone]    [facility]

## 2023-09-16 NOTE — Unmapped (Signed)
 BREAST MEDICAL ONCOLOGY ENCOUNTER  -----------------------------------------------------------------------------------------------------------------  Referring Physician: Earvin Goldberg, FNP  PCP: Sonna Dus, MD  Consulting Physicians: Surgical oncology: Jae Maya, MD.  Radiation oncology: Laymon Priest, MD.     Reason for Visit: The patient is seen in consultation at the request of Dr. Earvin Goldberg for evaluation of breast cancer.  -----------------------------------------------------------------------------------------------------  Assessment: [name] is a 78 y.o. female from [facility] with screening detected (R) HR+/HER2 low (2+) IDC, pT1c pN1a, G2, 1.7 cm. S/p right partial mastectomy 09/03/20 and radiation 01/09/21. Oncotype testing 8, no adjuvant chemotherapy was recommended. She completed radiation and subsequently started anastrozole  on 01/23/21.     [name] is here for follow up on anastrozole . Overall tolerating well. Since last visit she has had all teeth extracted and is pending fit for dentures. We discussed osteoporosis seen on DEXA 11/2022. We reviewed bone strengthener options including Reclast vs alendronate .     Patient is agreeable to alendronate . Administration and potential side effects reviewed.     Alendronate  administration:  Recommend that you take alendronate  first thing in the morning and >=30 minutes before the first food, beverage (except plain water ), or other medication(s) of the day. Take with 6 to 8 oz of plain water . Do not take with mineral water  or with other beverages. Recommend to stay upright (not to lie down) for >=30 minutes and until after first food of the day (to reduce esophageal irritation). The tablet should be swallowed whole, not crushed.  Side effects discussed included but were not limited to: GI mucosal irritation, hypocalcemia, headache, muscle pain, and ONJ.      Plan  1.  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 1/1 SLNB (35 mm, ECE)  Chemotherapy: Oncotype score: 8, no adjuvant chemo   Radiation: s/p whole breast radiation and tumor bed boost, completed 01/09/21  Endocrine therapy: Plan for 5-10 years of AI. Started anastrozole  01/23/21.  Bone health: see below  Breast imaging: MMG on 07/2023, BI-RADS 2, repeat in 1 year  Systemic staging: PET from 10/25/20 negative for evidence of metastatic disease.   Genetics: None at this time.     2. Osteoporosis  Dexa 04/18/21 osteopenia; lowest T score -2.4,   DEXA 11/27/22 osteoporosis, lowest T score -2.9 femoral neck. Repeat 10/2024  Alendronate  started 09/17/23--  --taking calcium/vit D    3.  Supportive care  -- Hx of liver disease. Takes lactulose  3x daily. Follows with Dr. Suzanne Erps (? - no notes in Epic). Encouraged her to schedule follow up with hepatology. Will provide scheduling number  -- Hot flashes: Vitamin E, 400 IU daily.   -- Yeast intertrigo, inframammary folds: Nystatin  powder BID, keep skin dry. Now improved    3. Follow-up  --Med onc in 6 months  --return to Dr. Ruthy Cox with MMG in 07/2024    I personally spent 40 minutes face-to-face and non-face-to-face in the care of this patient, which includes all pre, intra, and post visit time on the date of service.  All documented time was specific to the E/M visit and does not include any procedures that may have been performed.    This note with created with dictation software. Please forgive any transcription errors and notify writer if changes are required.     Verlena Glenn, MD  [facility] Breast Medical Oncology    -----------------------------------------------------------------------------------------------------    History of Present Illness  [name] is a 78 year old female  with hormone receptor-positive breast cancer who presents for follow-up.    She has been on anastrozole  since August 2022 following her breast cancer surgery nearly three years ago. She takes the medication once daily and is expected to continue for at least five years. Her recent mammogram in February was normal, and she is on a routine follow-up schedule.    She has osteoporosis, which was noted to have worsened slightly in one area on her last bone density scan from last summer. She experienced a fall in April of last year, descending five or six steps, but did not sustain any fractures.    She underwent dental extractions in December due to teeth breaking, attributed to radiation effects. She is currently without teeth and is in the process of getting dentures, having been on a soft food diet since December. The healing process was prolonged to ensure proper fitting of the dentures, which are expected to be ready in two weeks.    She has a history of liver disease but reports no recent complications.    She has been using a powder for a yeast infection under her breast, which has improved. Previously, the area was red, bloody-looking, and had an odor, but it is now better.        Review of Systems: A complete twelve systems review was obtained and is positive per the HPI but otherwise negative in detail. See MIMS #1170 where available.    Functional Status: ECOG PS 0. ADLS independent. IADLS independent. Uses wheelchair for long walks. Cognition intact.     Social: Lives in [place], [place] with husband, daughter Boyd Cabal, and her granddaughter. Has another daughter Jenette Mitchell, three grandchildren, and a brother nearby. Married for 50 years. Retired. 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     History of the Present Illness: [name] is a pleasant 78 y.o. female who  has a past medical history of Alcoholism, Alcoholism /alcohol abuse, Cirrhosis, Depression, Hypertension, Liver disease, and Malignant neoplasm of overlapping sites of right breast in female, estrogen receptor positive (10/03/2020). She is seen in consultation at the request of Dr. Earvin Goldberg for evaluation of breast cancer. She had an abnormal screening MMG 06/20/20. She had diagnostic MMG/US  of right breast 07/23/20 with 0.7 x 0.6 x 0.6 cm irregular hypoechoic nonparallel mass with indistinct margins and peripheral vascularity at the 5:00 position approximately 8 CFN. Core bx of right breast 07/30/20 with IDC, G2, ER+(100%), PR+(100%), and HER-2 negative (2+ by IHC, FISH not amplified). She underwent right partial mastectomy 09/03/20. Final path: IDC, 1.7 cm in greatest dimension, G2, associated DCIS. Negative margins. 1/1 LN positive for carcinoma.     Hematology/Oncology History Overview Note   Oncotype?     Malignant neoplasm of overlapping sites of right breast in female, estrogen receptor positive   07/30/2020 Biopsy    A: Breast, right, core biopsy  - Invasive ductal carcinoma (see comment)  - Nottingham combined histologic grade: 2               Tubule formation: 3               Nuclear grade: 2  Mitotic score: 1  - Invasive carcinoma measures approximately 3.5 mm in this specimen  - Solid papillary ductal carcinoma in situ  - Ancillary studies (see biomarker synoptic)  Estrogen receptor: Positive (100%)               Progesterone receptor: Positive (100%)               HER2 IHC: Equivocal (2+)  HER2 FISH: Insufficient cellularity     09/03/2020 Surgery    A.  Breast, right, partial mastectomy  - Invasive ductal carcinoma (see synoptic report)  - Tumor size: 17 mm in greatest dimension  - Ductal carcinoma in situ (DCIS), grade 2, solid papillary and cribriform types  - Size extent of DCIS: At least 55 mm   - Margin status for specimen A (see extended margins below)          Invasive carcinoma: Negative, <1 mm from anterior and inferior margins          Ductal carcinoma in situ: Negative, <1 mm from superior margin  - Ancillary studies reported on prior core biopsy [id number]          Estrogen receptor: Positive (100%)          Progesterone receptor: Positive (100%)          HER2 IHC: Equivocal (2+)          HER2 FISH: Insufficient cellularity (see comment)     B.  Breast, right, superior margin,excision  - Single microscopic focus of ductal carcinoma in situ  - Margin: Negative, <1 mm    C.  Breast, right, lateral margin, excision  - Negative for carcinoma    D.  Breast, right,  inferior margin, excision  - Negative for carcinoma    E.  Breast, right, medial margin, excision  - Negative for carcinoma     F. Right axilla, sentinel lymph node, biopsy  - One lymph node positive for carcinoma (1/1)     09/03/2020 -  Cancer Staged    Staging form: Breast, AJCC 8th Edition  - Pathologic stage from 09/03/2020: Stage IA (pT1c, pN1a, cM0, G2, ER+, PR+, HER2-, Oncotype DX score: 8) - Signed by Aileen Alexanders, FNP on 11/06/2021       11/08/2020 -  Radiation    Radiation Therapy Treatment Details (Noted on 11/08/2020)  Site: Right Breast  Technique: 3D CRT  Goal: No goal specified  Planned Treatment Start Date: No planned start date specified         Patient Active Problem List   Diagnosis    Alcoholic liver disease    Hypokalemia    HTN (hypertension)    Chronic maxillary sinusitis    Alcoholism    Encephalopathy    Depression with anxiety    Morbid (severe) obesity due to excess calories (CMS-HCC)    Macrocytosis    DJD (degenerative joint disease)    Malignant neoplasm of overlapping sites of right breast in female, estrogen receptor positive    Liver lesion       Past Medical History:   Diagnosis Date    Alcoholism     Alcoholism /alcohol abuse     Cirrhosis     Depression     Hypertension     Liver disease     Malignant neoplasm of overlapping sites of right breast in female, estrogen receptor positive 10/03/2020       Past Surgical History:   Procedure Laterality Date    BREAST BIOPSY Right     benign-a long time ago    BREAST BIOPSY Right 07/2020    malignant    BREAST LUMPECTOMY Right  4 2022    CHOLECYSTECTOMY      PR BX/REMV,LYMPH NODE,DEEP AXILL Right 09/03/2020    Procedure: BX/EXC LYMPH NODE; OPEN, DEEP AXILRY NODE;  Surgeon: Maricela Shoe, MD;  Location: [facility];  Service: Surgical Oncology Breast    PR INTRAOPERATIVE SENTINEL LYMPH NODE ID W DYE INJECTION Right 09/03/2020    Procedure: INTRAOPERATIVE IDENTIFICATION SENTINEL LYMPH NODE(S) INCLUDE INJECTION NON-RADIOACTIVE DYE, WHEN PERFORMED;  Surgeon: Maricela Shoe, MD;  Location: [facility];  Service: Surgical Oncology Breast    PR MASTECTOMY, PARTIAL Right 09/03/2020    Procedure: MASTECTOMY, PARTIAL (EG, LUMPECTOMY, TYLECTOMY, QUADRANTECTOMY, SEGMENTECTOMY);  Surgeon: Maricela Shoe, MD;  [facility];  Service: Surgical Oncology Breast    PR UPPER GI ENDOSCOPY,BIOPSY N/A 02/03/2018    Procedure: UGI ENDOSCOPY; WITH BIOPSY, SINGLE OR MULTIPLE;  Surgeon: Roylene Corn, MD;  [facility];  Service: Gastroenterology    RADIATION Right     unsure finish date    TUBAL LIGATION       Gyn History: G2P2. Menopause in her 65s. Denies HRT.     Medications:  Current Outpatient Medications   Medication Sig Dispense Refill    acetaminophen  (TYLENOL ) 325 MG tablet Take 2 tablets (650 mg total) by mouth every six (6) hours as needed for pain.  0    amlodipine  (NORVASC ) 5 MG tablet Take 1 tablet (5 mg total) by mouth daily. TAKE 1 TABLET(5 MG) BY MOUTH DAILY 90 tablet 2    calcium carbonate 1,500 mg (600 mg elem calcium) tablet Take 1 tablet (600 mg elem calcium total) by mouth daily.      carvedilol  (COREG ) 3.125 MG tablet Take 1 tablet (3.125 mg total) by mouth two (2) times a day. 180 tablet 2    cyanocobalamin 1000 MCG tablet Take 1 tablet (1,000 mcg total) by mouth daily.      folic acid  (FOLVITE ) 1 MG tablet Take 1 tablet (1 mg total) by mouth daily. 90 tablet 2    magnesium  oxide (MAG-OX) 400 mg (241.3 mg elemental magnesium ) tablet Take 1 tablet (400 mg total) by mouth two (2) times a day. 180 tablet 3    nystatin  (MYCOSTATIN ) 100,000 unit/gram powder Apply to affected area 3 times daily 15 g 0    potassium chloride  20 MEQ ER tablet TAKE 1 TABLET(20 MEQ) BY MOUTH TWICE DAILY 180 tablet 1    vitamin E-268 mg, 400 UNIT, 268 mg (400 UNIT) capsule Take 1 capsule (268 mg total) by mouth daily. 2 tabs      alendronate  (FOSAMAX ) 70 MG tablet Take 1 tablet (70 mg total) by mouth every seven (7) days. 12 tablet 3    anastrozole  (ARIMIDEX ) 1 mg tablet Take 1 tablet (1 mg total) by mouth daily. 90 tablet 3    lactulose  (CHRONULAC ) 10 gram/15 mL solution TAKE 15 ML BY MOUTH THREE TIMES DAILY, TAKE ENOUGH TO HAVE 3 BOWEL MOVEMENTS PER DAY (Patient not taking: Reported on 09/17/2023) 1350 mL 5     No current facility-administered medications for this visit.       Allergies:  Allergies   Allergen Reactions    Sulfa (Sulfonamide Antibiotics) Anaphylaxis    Sulfur  Anaphylaxis     Tolerated sulfur  colloid injection without incident 09/03/2020.    Aspirin      thrombocytopenia       Family History: family history includes Heart disease in her mother; Lupus in her brother; No Known Problems in  her daughter, father, maternal grandfather, maternal grandmother, paternal grandfather, paternal grandmother, sister, and another family member.    Social History:   Social History     Social History Narrative    Daughter fills med boxes weekly.      Physical Examination:  Vital Signs: BP 139/69  - Pulse 67  - Temp 36.1 ??C (96.9 ??F) (Temporal)  - Resp 18  - SpO2 98%     CONSTITUTIONAL: in NAD, appears stated age   HEENT: MMM, no oral lesions or exudates   CARDIO/RESPIRATORY: no increased work of breathing. No dyspnea with conversation.   GI: ND  BREAST: Breasts: (R) breast: Benign. Well healed incision in UOQ. Healed incision in inferior aspect extending down to the inframammary fold. (L) breast: benign. No axillary adenopathy bilat.   SKIN: mild inframammary erythema consistent with tinea. Improving   EXTREMITIES: no LE edema  NEURO: Alert and oriented, speech intact, following commands, moving all extremities well, no focal deficits appreciated  PSYCH: Normal mood and appropriate affect      DATA REVIEW:  Laboratory: I personally reviewed the pertinent laboratory data.  Radiology: I personally reviewed the pertinent imaging.  Pathology: I personally reviewed the pathology report.

## 2023-09-17 ENCOUNTER — Ambulatory Visit
Admit: 2023-09-17 | Discharge: 2023-09-18 | Payer: Medicare (Managed Care) | Attending: Student in an Organized Health Care Education/Training Program | Primary: Student in an Organized Health Care Education/Training Program

## 2023-09-17 DIAGNOSIS — Z170 Estrogen receptor positive status [ER+]: Principal | ICD-10-CM

## 2023-09-17 DIAGNOSIS — C50811 Malignant neoplasm of overlapping sites of right female breast: Principal | ICD-10-CM

## 2023-09-17 MED ORDER — ALENDRONATE 70 MG TABLET
ORAL_TABLET | ORAL | 3 refills | 84.00 days | Status: CP
Start: 2023-09-17 — End: ?

## 2023-09-17 MED ORDER — ANASTROZOLE 1 MG TABLET
ORAL_TABLET | Freq: Every day | ORAL | 3 refills | 90.00 days | Status: CP
Start: 2023-09-17 — End: ?

## 2023-09-17 NOTE — Unmapped (Signed)
 Alendronate instructions:  Recommend that you take alendronate first thing in the morning and >=30 minutes before the first food, beverage (except plain water), or other medication(s) of the day. Take with 6 to 8 oz of plain water. Do not take with mineral water or with other beverages. Recommend to stay upright (not to lie down) for >=30 minutes and until after first food of the day (to reduce esophageal irritation). The tablet should be swallowed whole, not crushed.  Side effects discussed included but were not limited to: GI mucosal irritation, hypocalcemia, headache, muscle pain, and ONJ.

## 2023-09-29 MED ORDER — POTASSIUM CHLORIDE ER 20 MEQ TABLET,EXTENDED RELEASE(PART/CRYST)
ORAL_TABLET | Freq: Two times a day (BID) | ORAL | 0 refills | 0.00000 days
Start: 2023-09-29 — End: ?

## 2023-09-29 NOTE — Unmapped (Signed)
 Patient called and left a message to give her a call and don't know the reason of the call.  Left a message to return call.    4:oo    LH

## 2023-09-29 NOTE — Unmapped (Signed)
 Patient is requesting the following refill  Requested Prescriptions     Pending Prescriptions Disp Refills    potassium chloride  20 MEQ ER tablet 180 tablet 1     Sig: Take 1 tablet (20 mEq total) by mouth two (2) times a day.       Recent Visits  Date Type Provider Dept   04/23/23 Office Visit Sonna Dus, MD [place] [facility] [address] [facility]   Showing recent visits within past 365 days and meeting all other requirements  Future Appointments  Date Type Provider Dept   10/21/23 Appointment Sonna Dus, MD [place] [facility] [address] [facility]   Showing future appointments within next 365 days and meeting all other requirements       Labs: Patient is requesting the following refill  Requested Prescriptions      No prescriptions requested or ordered in this encounter       Recent Visits  Date Type Provider Dept   04/23/23 Office Visit Sonna Dus, MD [place] [facility] [address] [facility]   Showing recent visits within past 365 days and meeting all other requirements  Future Appointments  Date Type Provider Dept   10/21/23 Appointment Sonna Dus, MD [place] [facility] [address] [facility]   Showing future appointments within next 365 days and meeting all other requirements

## 2023-09-30 MED ORDER — POTASSIUM CHLORIDE ER 20 MEQ TABLET,EXTENDED RELEASE(PART/CRYST)
ORAL_TABLET | Freq: Two times a day (BID) | ORAL | 0 refills | 90.00000 days
Start: 2023-09-30 — End: ?

## 2023-12-01 MED ORDER — POTASSIUM CHLORIDE ER 20 MEQ TABLET,EXTENDED RELEASE(PART/CRYST)
ORAL_TABLET | Freq: Two times a day (BID) | ORAL | 1 refills | 90.00000 days
Start: 2023-12-01 — End: ?

## 2023-12-01 NOTE — Unmapped (Signed)
 Copied from CRM #[id number]. Topic: Access To Clinicians - Medication Refill  >> Dec 01, 2023 10:53 AM Alan SQUIBB wrote:  The caller has not previously requested the refill from their pharmacy.    The patient is requesting the following:    Medication(s) for refill: potassium chloride  20 MEQ ER tablet   Quantity for 3 month supply    Pharmacy name and address: [id number] - [place], [place] - [address] [place] [address] AT [address]    Please contact The patient by Cell Phone in regards to this request.    Coverage: yes, coverage is accurate on file.    Medication request callback turnaround time: 72 business hours. Programmer, systems Notified)

## 2023-12-01 NOTE — Unmapped (Signed)
 Patient is requesting the following refill  Requested Prescriptions     Pending Prescriptions Disp Refills    potassium chloride  20 MEQ ER tablet [Pharmacy Med Name: POTASSIUM CL ER TABLETS] 180 tablet      Sig: TAKE 1 TABLET(20 MEQ) BY MOUTH TWICE DAILY       Recent Visits  Date Type Provider Dept   04/23/23 Office Visit Alyse Slater Pao, MD [place] [facility] [address] [facility]   Showing recent visits within past 365 days and meeting all other requirements  Future Appointments  No visits were found meeting these conditions.  Showing future appointments within next 365 days and meeting all other requirements       Labs: Potassium:   Potassium (mmol/L)   Date Value   04/23/2023 4.2

## 2023-12-01 NOTE — Unmapped (Signed)
 Patient is requesting the following refill  Requested Prescriptions     Pending Prescriptions Disp Refills    potassium chloride  20 MEQ ER tablet 60 tablet 1     Sig: Take 1 tablet (20 mEq total) by mouth two (2) times a day.       Recent Visits  Date Type Provider Dept   04/23/23 Office Visit Alyse Slater Pao, MD [place] [facility] [address] [facility]   Showing recent visits within past 365 days and meeting all other requirements  Future Appointments  No visits were found meeting these conditions.  Showing future appointments within next 365 days and meeting all other requirements       Labs: Potassium:   Potassium (mmol/L)   Date Value   04/23/2023 4.2

## 2023-12-01 NOTE — Unmapped (Signed)
 Patient is requesting the following refill  Requested Prescriptions     Pending Prescriptions Disp Refills    potassium chloride  20 MEQ ER tablet [Pharmacy Med Name: POTASSIUM CL ER TABLETS] 180 tablet      Sig: TAKE 1 TABLET(20 MEQ) BY MOUTH TWICE DAILY       Recent Visits  Date Type Provider Dept   04/23/23 Office Visit Alyse Slater Pao, MD  [facility] [address] [facility]   Showing recent visits within past 365 days and meeting all other requirements  Future Appointments  No visits were found meeting these conditions.  Showing future appointments within next 365 days and meeting all other requirements       Labs: Potassium:   Potassium (mmol/L)   Date Value   04/23/2023 4.2

## 2023-12-01 NOTE — Unmapped (Signed)
 Addended by: ANN, Brier Firebaugh on: 12/01/2023 11:33 AM     Modules accepted: Orders

## 2023-12-22 MED ORDER — POTASSIUM CHLORIDE ER 20 MEQ TABLET,EXTENDED RELEASE(PART/CRYST)
ORAL_TABLET | Freq: Two times a day (BID) | ORAL | 1 refills | 90.00000 days
Start: 2023-12-22 — End: ?

## 2023-12-22 NOTE — Unmapped (Signed)
 Patient is requesting the following refill  Requested Prescriptions      No prescriptions requested or ordered in this encounter       Recent Visits  Date Type Provider Dept   04/23/23 Office Visit Alyse Slater Pao, MD [place] [facility] [address] [facility]   Showing recent visits within past 365 days and meeting all other requirements  Future Appointments  No visits were found meeting these conditions.  Showing future appointments within next 365 days and meeting all other requirements       Labs: Patient is requesting the following refill  Requested Prescriptions     Pending Prescriptions Disp Refills    potassium chloride  20 MEQ ER tablet 60 tablet 1     Sig: Take 1 tablet (20 mEq total) by mouth two (2) times a day.       Recent Visits  Date Type Provider Dept   04/23/23 Office Visit Alyse Slater Pao, MD [place] [facility] [address] [facility]   Showing recent visits within past 365 days and meeting all other requirements  Future Appointments  No visits were found meeting these conditions.  Showing future appointments within next 365 days and meeting all other requirements       Labs:

## 2023-12-22 NOTE — Unmapped (Unsigned)
 Copied from CRM #[id number]. Topic: Access To Clinicians - Medication Refill  >> Dec 22, 2023  9:12 AM Norine BROCKS wrote:  The caller reports that they have previously requested refill from pharmacy, but have not received authorization yet.     The guardian, [name],  is requesting the following:     Medication(s) for refill: potassium chloride  20 MEQ ER tablet   Quantity for NA    Pharmacy name and address: [id number] - [address] [address] AT [facility] [address]  [address] [place] [address] [place] [address]  Phone: [phone] Fax: [phone]        Please contact The guardian, [name],  by Cell Phone in regards to this request.    Coverage: yes, coverage is accurate on file.    Urgent callback turnaround time: within 24 business hours. (Caller Notified)    Urgent Reason: Almost or completely out of medication(s)

## 2024-02-23 MED ORDER — POTASSIUM CHLORIDE ER 20 MEQ TABLET,EXTENDED RELEASE(PART/CRYST)
ORAL_TABLET | Freq: Two times a day (BID) | ORAL | 90.00000 days
Start: 2024-02-23 — End: ?

## 2024-02-23 NOTE — Unmapped (Signed)
 Patient is requesting the following refill  Requested Prescriptions     Pending Prescriptions Disp Refills    potassium chloride  20 MEQ ER tablet [Pharmacy Med Name: POTASSIUM CL ER TABLETS] 180 tablet      Sig: TAKE 1 TABLET(20 MEQ) BY MOUTH TWICE DAILY       Recent Visits  Date Type Provider Dept   04/23/23 Office Visit Alyse Slater Pao, MD  [facility] [address] [facility]   Showing recent visits within past 365 days and meeting all other requirements  Future Appointments  No visits were found meeting these conditions.  Showing future appointments within next 365 days and meeting all other requirements       Labs: Potassium:   Potassium (mmol/L)   Date Value   04/23/2023 4.2

## 2024-02-29 ENCOUNTER — Ambulatory Visit (HOSPITAL_COMMUNITY): Payer: Self-pay

## 2024-02-29 ENCOUNTER — Ambulatory Visit (INDEPENDENT_AMBULATORY_CARE_PROVIDER_SITE_OTHER)

## 2024-02-29 ENCOUNTER — Ambulatory Visit
Admission: EM | Admit: 2024-02-29 | Discharge: 2024-02-29 | Source: Ambulatory Visit | Attending: Emergency Medicine | Admitting: Emergency Medicine

## 2024-02-29 ENCOUNTER — Ambulatory Visit: Admit: 2024-02-29 | Discharge: 2024-03-04 | Payer: Medicare (Managed Care)

## 2024-02-29 ENCOUNTER — Inpatient Hospital Stay
Admission: EM | Admit: 2024-02-29 | Discharge: 2024-03-04 | Disposition: A | Discharge status: Home Health | Payer: Medicare (Managed Care) | Source: Other Acute Inpatient Hospital | Admitting: Internal Medicine

## 2024-02-29 DIAGNOSIS — R0602 Shortness of breath: Secondary | ICD-10-CM

## 2024-02-29 DIAGNOSIS — I10 Essential (primary) hypertension: Secondary | ICD-10-CM | POA: Insufficient documentation

## 2024-02-29 DIAGNOSIS — Z79899 Other long term (current) drug therapy: Secondary | ICD-10-CM | POA: Insufficient documentation

## 2024-02-29 DIAGNOSIS — S90822D Blister (nonthermal), left foot, subsequent encounter: Secondary | ICD-10-CM | POA: Insufficient documentation

## 2024-02-29 DIAGNOSIS — I4891 Unspecified atrial fibrillation: Secondary | ICD-10-CM | POA: Diagnosis present

## 2024-02-29 DIAGNOSIS — F1020 Alcohol dependence, uncomplicated: Secondary | ICD-10-CM | POA: Insufficient documentation

## 2024-02-29 DIAGNOSIS — J320 Chronic maxillary sinusitis: Secondary | ICD-10-CM | POA: Insufficient documentation

## 2024-02-29 DIAGNOSIS — R600 Localized edema: Secondary | ICD-10-CM | POA: Diagnosis not present

## 2024-02-29 DIAGNOSIS — R42 Dizziness and giddiness: Secondary | ICD-10-CM | POA: Diagnosis not present

## 2024-02-29 DIAGNOSIS — R5383 Other fatigue: Secondary | ICD-10-CM | POA: Diagnosis not present

## 2024-02-29 DIAGNOSIS — R030 Elevated blood-pressure reading, without diagnosis of hypertension: Secondary | ICD-10-CM | POA: Insufficient documentation

## 2024-02-29 DIAGNOSIS — J90 Pleural effusion, not elsewhere classified: Secondary | ICD-10-CM | POA: Insufficient documentation

## 2024-02-29 LAB — COMPREHENSIVE METABOLIC PANEL WITH GFR
ALT: 16 U/L (ref 0–44)
AST: 26 U/L (ref 15–41)
Albumin: 3.1 g/dL — ABNORMAL LOW (ref 3.5–5.0)
Alkaline Phosphatase: 133 U/L — ABNORMAL HIGH (ref 38–126)
Anion gap: 9 (ref 5–15)
BUN: 11 mg/dL (ref 8–23)
CO2: 30 mmol/L (ref 22–32)
Calcium: 9.3 mg/dL (ref 8.9–10.3)
Chloride: 103 mmol/L (ref 98–111)
Creatinine, Ser: 0.51 mg/dL (ref 0.44–1.00)
GFR, Estimated: >60 mL/min (ref >60)
Glucose, Bld: 118 mg/dL — ABNORMAL HIGH (ref 70–99)
Potassium: 4.4 mmol/L (ref 3.5–5.1)
Sodium: 142 mmol/L (ref 135–145)
Total Bilirubin: 1.4 mg/dL — ABNORMAL HIGH (ref 0.0–1.2)
Total Protein: 6.8 g/dL (ref 6.5–8.1)

## 2024-02-29 LAB — CBC WITH DIFFERENTIAL/PLATELET
Abs Immature Granulocytes: 0.03 K/uL (ref 0.00–0.07)
Basophils Absolute: 0 K/uL (ref 0.0–0.1)
Basophils Relative: 1 %
Eosinophils Absolute: 0.2 K/uL (ref 0.0–0.5)
Eosinophils Relative: 2 %
HCT: 41.8 % (ref 36.0–46.0)
Hemoglobin: 13.3 g/dL (ref 12.0–15.0)
Immature Granulocytes: 1 %
Lymphocytes Relative: 16 %
Lymphs Abs: 1 K/uL (ref 0.7–4.0)
MCH: 32.4 pg (ref 26.0–34.0)
MCHC: 31.8 g/dL (ref 30.0–36.0)
MCV: 101.7 fL — ABNORMAL HIGH (ref 80.0–100.0)
Monocytes Absolute: 0.6 K/uL (ref 0.1–1.0)
Monocytes Relative: 9 %
Neutro Abs: 4.7 K/uL (ref 1.7–7.7)
Neutrophils Relative %: 71 %
Platelets: 121 K/uL — ABNORMAL LOW (ref 150–400)
RBC: 4.11 MIL/uL (ref 3.87–5.11)
RDW: 15.4 % (ref 11.5–15.5)
WBC: 6.5 K/uL (ref 4.0–10.5)
nRBC: 0 % (ref 0.0–0.2)

## 2024-02-29 LAB — BRAIN NATRIURETIC PEPTIDE: B Natriuretic Peptide: 317 pg/mL — ABNORMAL HIGH (ref 0.0–100.0)

## 2024-02-29 LAB — CBC W/ AUTO DIFF
BASOPHILS ABSOLUTE COUNT: 0 10*9/L (ref 0.0–0.1)
BASOPHILS RELATIVE PERCENT: 0.4 %
EOSINOPHILS ABSOLUTE COUNT: 0.1 10*9/L (ref 0.0–0.5)
EOSINOPHILS RELATIVE PERCENT: 2.5 %
HEMATOCRIT: 43.1 % (ref 34.0–44.0)
HEMOGLOBIN: 14.6 g/dL (ref 11.3–14.9)
LYMPHOCYTES ABSOLUTE COUNT: 0.8 10*9/L — ABNORMAL LOW (ref 1.1–3.6)
LYMPHOCYTES RELATIVE PERCENT: 14.8 %
MEAN CORPUSCULAR HEMOGLOBIN CONC: 33.9 g/dL (ref 32.0–36.0)
MEAN CORPUSCULAR HEMOGLOBIN: 33.3 pg — ABNORMAL HIGH (ref 25.9–32.4)
MEAN CORPUSCULAR VOLUME: 98.3 fL — ABNORMAL HIGH (ref 77.6–95.7)
MEAN PLATELET VOLUME: 8.3 fL (ref 6.8–10.7)
MONOCYTES ABSOLUTE COUNT: 0.4 10*9/L (ref 0.3–0.8)
MONOCYTES RELATIVE PERCENT: 6.8 %
NEUTROPHILS ABSOLUTE COUNT: 4 10*9/L (ref 1.8–7.8)
NEUTROPHILS RELATIVE PERCENT: 75.5 %
NUCLEATED RED BLOOD CELLS: 0 /100{WBCs} (ref <=4)
PLATELET COUNT: 132 10*9/L — ABNORMAL LOW (ref 150–450)
RED BLOOD CELL COUNT: 4.38 10*12/L (ref 3.95–5.13)
RED CELL DISTRIBUTION WIDTH: 15.8 % — ABNORMAL HIGH (ref 12.2–15.2)
WBC ADJUSTED: 5.3 10*9/L (ref 3.6–11.2)

## 2024-02-29 LAB — C-REACTIVE PROTEIN: C-REACTIVE PROTEIN: <5 mg/L (ref <=10.0)

## 2024-02-29 LAB — COMPREHENSIVE METABOLIC PANEL
ALBUMIN: 3.1 g/dL — ABNORMAL LOW (ref 3.4–5.0)
ALKALINE PHOSPHATASE: 148 U/L — ABNORMAL HIGH (ref 46–116)
ALT (SGPT): 18 U/L (ref 10–49)
ANION GAP: 14 mmol/L (ref 5–14)
AST (SGOT): 54 U/L — ABNORMAL HIGH (ref <=34)
BILIRUBIN TOTAL: 1.4 mg/dL — ABNORMAL HIGH (ref 0.3–1.2)
CALCIUM: 9.8 mg/dL (ref 8.7–10.4)
CHLORIDE: 104 mmol/L (ref 98–107)
CO2: 29.6 mmol/L (ref 20.0–31.0)
GLUCOSE RANDOM: 111 mg/dL (ref 70–179)
PROTEIN TOTAL: 7 g/dL (ref 5.7–8.2)
SODIUM: 148 mmol/L — ABNORMAL HIGH (ref 135–145)

## 2024-02-29 LAB — MAGNESIUM: MAGNESIUM: 2.2 mg/dL (ref 1.6–2.6)

## 2024-02-29 LAB — PRO-BNP: PRO-BNP: 846 pg/mL — ABNORMAL HIGH (ref <=300.0)

## 2024-02-29 LAB — TSH
THYROID STIMULATING HORMONE: 3.525 u[IU]/mL (ref 0.550–4.780)
THYROID STIMULATING HORMONE: 2.148 u[IU]/mL (ref 0.550–4.780)

## 2024-02-29 LAB — URINALYSIS WITH MICROSCOPY WITH CULTURE REFLEX PERFORMABLE
BACTERIA: NONE SEEN /HPF
BILIRUBIN UA: NEGATIVE
BLOOD UA: NEGATIVE
GLUCOSE UA: NEGATIVE
KETONES UA: NEGATIVE
LEUKOCYTE ESTERASE UA: NEGATIVE
NITRITE UA: NEGATIVE
PH UA: 7 (ref 5.0–9.0)
PROTEIN UA: NEGATIVE
RBC UA: 1 /HPF (ref <=4)
SPECIFIC GRAVITY UA: 1.024 (ref 1.003–1.030)
SQUAMOUS EPITHELIAL: <1 /HPF (ref 0–5)
UROBILINOGEN UA: <2
WBC UA: 2 /HPF (ref 0–5)

## 2024-02-29 LAB — BUN: BLOOD UREA NITROGEN: 7 mg/dL — ABNORMAL LOW (ref 9–23)

## 2024-02-29 LAB — HIGH SENSITIVITY TROPONIN I - 6 HOUR SERIAL
HIGH SENSITIVITY TROPONIN - DELTA (2-6H): 0 ng/L (ref <=7)
HIGH-SENSITIVITY TROPONIN I - 6 HOUR: 3 ng/L (ref <=34)

## 2024-02-29 LAB — HIGH SENSITIVITY TROPONIN I - 2 HOUR SERIAL
HIGH SENSITIVITY TROPONIN - DELTA (0-2H): 0 ng/L (ref <=7)
HIGH-SENSITIVITY TROPONIN I - 2 HOUR: 3 ng/L (ref <=34)

## 2024-02-29 LAB — SEDIMENTATION RATE: ERYTHROCYTE SEDIMENTATION RATE: 13 mm/h (ref 0–30)

## 2024-02-29 LAB — CREATININE
CREATININE: 0.43 mg/dL — ABNORMAL LOW (ref 0.55–1.02)
EGFR CKD-EPI (2021) FEMALE: >90 mL/min/1.73m2 (ref >=60)

## 2024-02-29 LAB — HIGH SENSITIVITY TROPONIN I - SERIAL: HIGH SENSITIVITY TROPONIN I: 3 ng/L (ref <=34)

## 2024-02-29 LAB — POTASSIUM: POTASSIUM: 4.4 mmol/L (ref 3.4–4.8)

## 2024-02-29 LAB — PROTIME-INR
INR: 1.13
PROTIME: 12.9 s — ABNORMAL HIGH (ref 9.9–12.6)

## 2024-02-29 NOTE — ED Notes (Signed)
 Patient is being discharged from the Urgent Care and sent to the Emergency Department via POV . Per Venetia Motto NP, patient is in need of higher level of care due to A-fib. Patient is aware and verbalizes understanding of plan of care.  Vitals:   02/29/24 1058  BP: 128/66  Pulse: 64  Resp: 16  SpO2: 98%

## 2024-02-29 NOTE — ED Triage Notes (Signed)
 Pt c/o dizziness,fatigue & sob x1 wk. Also c/o blister on L foot 2 wks. States has been staying with husband at the hospital.   States BP was elevated this AM. Highest being 138/109. Has taken BP meds.

## 2024-02-29 NOTE — ED Provider Notes (Addendum)
 [facility]    CSN: [id number] Arrival date & time: 02/29/24  1030      History   Chief Complaint Chief Complaint  Patient presents with   Hypertension   Fatigue    HPI Brandi Hawkins is a 78 y.o. female.   HPI  78 year old female with past medical history significant for hypertension and chronic maxillary sinusitis presents for evaluation of dizziness, fatigue, and shortness of breath.  She is here with her daughter who reports that symptoms began while she was in the hospital with her husband approximately 1 month ago.  She has been home for the last 2 weeks.  She did develop lower extremity edema and a blister on her left foot.  Her legs have been weeping.  That has improved.  Patient came in today because her blood pressure was elevated at 138/109.  Patient has taken her blood pressure medication.  She denies any chest pain or palpitations.  Past Medical History:  Diagnosis Date   Chronic maxillary sinusitis 06/18/2015   HTN (hypertension) 09/21/2013   Hypertension     Patient Active Problem List   Diagnosis Date Noted   Alcoholism (HCC) 02/29/2024   Morbid (severe) obesity due to excess calories (HCC) 02/29/2024   Liver lesion 08/20/2021   Malignant neoplasm of overlapping sites of right breast in female, estrogen receptor positive (HCC) 10/03/2020   DJD (degenerative joint disease) 11/14/2019   Macrocytosis 11/14/2019   Depression with anxiety 01/11/2018   Encephalopathy 11/30/2017   Alcoholic liver disease 06/03/2017   Hypokalemia 06/03/2017   Chronic maxillary sinusitis 06/18/2015   HTN (hypertension) 09/21/2013    History reviewed. No pertinent surgical history.  OB History   No obstetric history on file.      Home Medications    Prior to Admission medications   Medication Sig Start Date End Date Taking? Authorizing Provider  folic acid (FOLVITE) 1 MG tablet Take 1 mg by mouth daily. 02/13/24  Yes [provider]  magnesium oxide  (MAG-OX) 400 MG tablet Take 1 tablet (400 mg total) by mouth two (2) times a day. 05/29/23 05/28/24 Yes [provider]  nystatin (MYCOSTATIN/NYSTOP) powder Apply to affected area 3 times daily 03/05/23 03/04/24 Yes [provider]  potassium chloride SA (KLOR-CON M) 20 MEQ tablet Take 20 mEq by mouth 2 (two) times daily. 01/25/24  Yes [provider]  alendronate (FOSAMAX) 70 MG tablet Take 70 mg by mouth once a week.    [provider]  amLODipine (NORVASC) 5 MG tablet Take 5 mg by mouth daily.    [provider]  anastrozole (ARIMIDEX) 1 MG tablet SMARTSIG:By Mouth    [provider]  aspirin EC 81 MG tablet Take 81 mg by mouth daily.    [provider]  carvedilol (COREG) 3.125 MG tablet Take 3.125 mg by mouth 2 (two) times daily.    [provider]  cetirizine (ZYRTEC) 10 MG tablet Take by mouth.    [provider]  furosemide (LASIX) 20 MG tablet Take by mouth. 07/17/15 07/16/16  [provider]  hydrochlorothiazide (HYDRODIURIL) 25 MG tablet  04/22/16   [provider]  meloxicam  (MOBIC ) 7.5 MG tablet Take 1 tablet (7.5 mg total) by mouth daily. Patient not taking: Reported on 06/17/2016 02/28/15   Cleotilde Jacobsen, NP  metoprolol succinate (TOPROL-XL) 50 MG 24 hr tablet  04/22/16   [provider]  traMADol  (ULTRAM ) 50 MG tablet Take 1 tablet (50 mg total) by mouth  every 8 (eight) hours as needed (Do not drive or operate machinery while taking as can cause drowsiness.). Patient not taking: Reported on 06/17/2016 02/28/15   Cleotilde Jacobsen, NP    Family History Family History  Problem Relation Age of Onset   Diabetes Brother    Diabetes Paternal Grandmother     Social History Social History   Tobacco Use   Smoking status: Never   Smokeless tobacco: Never  Substance Use Topics   Alcohol use: Yes   Drug use: No     Allergies   Elemental sulfur and Aspirin   Review of  Systems Review of Systems  Eyes:  Positive for visual disturbance.       Blurry vision, last visual exam was several years ago.  Respiratory:  Positive for cough and shortness of breath.   Cardiovascular:  Positive for leg swelling. Negative for chest pain and palpitations.  Skin:  Positive for color change and wound.       Blister on top of left foot with redness of the big toe and second toe.  Neurological:  Positive for dizziness and headaches.     Physical Exam Triage Vital Signs ED Triage Vitals  Encounter Vitals Group     BP 02/29/24 1058 128/66     Girls Systolic BP Percentile --      Girls Diastolic BP Percentile --      Boys Systolic BP Percentile --      Boys Diastolic BP Percentile --      Pulse Rate 02/29/24 1058 64     Resp 02/29/24 1058 16     Temp --      Temp Source 02/29/24 1058 Oral     SpO2 02/29/24 1058 98 %     Weight 02/29/24 1057 205 lb 6.4 oz (93.2 kg)     Height --      Head Circumference --      Peak Flow --      Pain Score 02/29/24 1104 0     Pain Loc --      Pain Education --      Exclude from Growth Chart --    No data found.  Updated Vital Signs BP 128/66 (BP Location: Left Arm)   Pulse 64   Resp 16   Wt 205 lb 6.4 oz (93.2 kg)   SpO2 98%   BMI 35.26 kg/m   Visual Acuity Right Eye Distance:   Left Eye Distance:   Bilateral Distance:    Right Eye Near:   Left Eye Near:    Bilateral Near:     Physical Exam Vitals and nursing note reviewed.  Constitutional:      Appearance: Normal appearance. She is not ill-appearing.  HENT:     Head: Normocephalic and atraumatic.  Cardiovascular:     Rate and Rhythm: Normal rate and regular rhythm.     Pulses: Normal pulses.     Heart sounds: Normal heart sounds. No murmur heard.    No friction rub. No gallop.  Pulmonary:     Effort: Pulmonary effort is normal.     Breath sounds: Normal breath sounds. No wheezing, rhonchi or rales.     Comments: Lung sounds are decreased in bases  bilaterally Musculoskeletal:        General: Normal range of motion.     Right lower leg: Edema present.     Left lower leg: Edema present.     Comments: 1+ pitting edema bilaterally.  Skin:  General: Skin is warm and dry.     Capillary Refill: Capillary refill takes less than 2 seconds.     Findings: Erythema present.  Neurological:     General: No focal deficit present.     Mental Status: She is alert and oriented to person, place, and time.      UC Treatments / Results  Labs (all labs ordered are listed, but only abnormal results are displayed) Labs Reviewed  CBC WITH DIFFERENTIAL/PLATELET - Abnormal; Notable for the following components:      Result Value   MCV 101.7 (*)    Platelets 121 (*)    All other components within normal limits  COMPREHENSIVE METABOLIC PANEL WITH GFR - Abnormal; Notable for the following components:   Glucose, Bld 118 (*)    Albumin 3.1 (*)    Alkaline Phosphatase 133 (*)    Total Bilirubin 1.4 (*)    All other components within normal limits  BRAIN NATRIURETIC PEPTIDE    EKG Atrial fibrillation with a ventricular rate of 68 bpm QRS duration 90 ms QT/QTc 400/420 ms   Radiology No results found.  Procedures Procedures (including critical care time)  Medications Ordered in UC Medications - No data to display  Initial Impression / Assessment and Plan / UC Course  I have reviewed the triage vital signs and the nursing notes.  Pertinent labs & imaging results that were available during my care of the patient were reviewed by me and considered in my medical decision making (see chart for details).   Patient is a pleasant, nontoxic-appearing 78 year old female presenting for evaluation of cardiopulmonary symptoms that the patient and daughter report have been going on for the last week to week and a half.  However, upon further discussion patient reports that she was having some new symptoms when she was with her husband in the hospital.   Her husband has been in the rehab facility for the last 2 weeks and the patient has been home.  She spent a lot of time at the bedside and on her feet with her husband which led to bilateral lower extremity edema, to the point of weeping, and blister formation on her left foot.  The blister ruptured approximately 1 week ago.  As you can see, there is a hyperpigmented area on the proximal aspect of the left big toe and the distal foot adjacent to the big toe and second toe.  This area of erythema is not blanchable, fluctuant, indurated, or hot to touch.  Also nontender to palpation.  DP and PT pulses are 2+.  Patient does have 1+ pitting edema in bilateral lower extremities.  Her skin is warm and dry with decreased skin turgor.  Her cardiopulmonary exam reveals S1-S2 heart sounds with regular rate and rhythm and clear lung sounds in the apices bilaterally.  She has decreased lung sounds in the mid lung fields through the bases bilaterally.  She is able to speak in full sentences without dyspnea or tachypnea.  Her blood pressure was elevated at home but she did take her blood pressure medication and her blood pressure currently 128/66.  She has never had any chest pain or palpitations.  She reports that her headache did resolve with Tylenol and her dizziness improved when her blood pressure came down.  She continues to have blurry vision but she thinks that is related to the fact that she needs new eyeglasses and reports that she has not had an eye exam in several years.  She has no history of congestive heart failure.  She reports that typically when she is at home she is not on her feet a lot so she does not experience swelling in her lower extremities.  Since she has been home for the past 2 weeks the swelling of both of her legs has markedly improved and they are no longer weeping.  I will obtain an EKG to evaluate the patient's heart, chest x-ray, CBC, CMP, and BNP.  CBC shows a normal white count of 6.5, H&H  is 13.3 and 41.8 respectively.  Platelets are mildly decreased at 121.  No abnormalities the differential.  Mild increase in MCV of 101.7.  CMP shows normal sodium of 142, potassium of 4.4, chloride 103.  Renal function is normal with a BUN of 11 and creatinine is 0.51, transaminases there is a mild increase phos of 133, increased T. bili 1.4, AST and ALT are.  Albumin is millage present 3.1.  Chest x-ray independent reviewed and evaluated by me.  Impression: There is a pleural effusion present on the right with a subsequent loss of lung volume.  Cardiomediastinal silhouette otherwise appears normal.  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  Radiology read is pending. Radiology impression states mild diffuse pulmonary vascular congestion with bilateral small pleural effusions.  Findings favor mild CHF/pulmonary edema.  EKG shows atrial fibrillation with a normal ventricular sponsor 68 bpm.  Patient has no document history of atrial fibrillation and given her shortness of breath and new pleural effusion I will refer her to the emergency department.  She and her daughter have elected to go to [facility].   Final Clinical Impressions(s) / UC Diagnoses   Final diagnoses:  Shortness of breath  Atrial fibrillation, unspecified type (HCC)  Pleural effusion     Discharge Instructions      Please go to the emergency department for your new onset atrial fibrillation, pulm effusion, and shortness of breath.     ED Prescriptions   None    PDMP not reviewed this encounter.   Bernardino Ditch, NP 02/29/24 1321    Bernardino Ditch, NP 02/29/24 1335

## 2024-02-29 NOTE — Discharge Instructions (Addendum)
 Please go to the emergency department for your new onset atrial fibrillation, pulm effusion, and shortness of breath.

## 2024-02-29 NOTE — Unmapped (Signed)
 Copied from CRM #[id number]. Topic: Nurse Triage Scheduling - See in   4 hours  >> Feb 29, 2024  9:04 AM Maurine Clause, RN wrote:  Post-triage support request related to Appointment Scheduling  Primary Complaint/Symptom: mild SOB, new onset  PASS Support Need: Schedule appointment within the triage disposition timing

## 2024-02-29 NOTE — Unmapped (Signed)
 Sent from UC for new onset a-fib and possible new CHF. Pt presented to UC for SOB, fatigue, and dizziness.     Of note, pt's temperature is 94-95 F via multiple methods.

## 2024-02-29 NOTE — Unmapped (Signed)
 [facility]  Emergency Department Provider Note     ED Clinical Impression     Final diagnoses:   Weakness (Primary)        Impression, Medical Decision Making, ED Course     4:17 PM   Impression: 78 y.o. female with a past medical history of HTN and right breast cancer (s/p radiation therapy and right partial mastectomy on 09/03/2000) who presents with generalized weakness and shortness of breath on exertion.  Initial temperature hypothermic to 34.6, however, repeat temperature shortly afterwards within normal limits.  Vital signs otherwise within normal limits    DDx/MDM: Generalized weakness, fatigue, shortness of breath on exertion 78 year old female with history of breast cancer, reportedly in remission.  Differential diagnosis viral illness, electrolyte abnormality, anemia, arrhythmia, pneumonia given symptoms have been ongoing since today with her husband in the Hospital.  Differential diagnosis includes viral illness, pneumonia, electrolyte abnormality, dehydration, ACS.  No known history of CHF or prior cardiac disease but would consider potential ACS.    Plan for labs -- including CBC, CMP, magnesium , PT-INR, hsTroponin I and PRO-BNP -- EKG, and XR imaging of the left foot and of the chest.     ED Course as of 03/01/24 1918   Mon Feb 29, 2024   1648 WBC: 5.3   1648 Magnesium : 2.2   1648 hsTroponin I: 3   1710 Temp: 35.8 ??C (96.5 ??F)   1712 CXR shows:  Pulmonary vascular congestion. No focal consolidation.   2003 XR foot shows:  1.  Diffuse soft tissue swelling about the foot without evidence of osteomyelitis or acute acute osseous abnormality.   2.  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     2036 Updated patient and family on findings.  Unfortunately, CT scan was delayed due to IV size.  No other complaints at this time.   2221 CTA shows:  1. No evidence of pulmonary embolism.  2. Enlarged cardiac atria. Enlarged central pulmonary arteries, likely reflecting pulmonary arterial hypertension.  3. Small to moderate right and small left pleural effusions. Diffuse body wall edema.  4. Hepatic cirrhosis.  5. Severe T12 vertebral compression deformity, described above and developed since a prior study performed on 10/20/2022.     2252 Upon ambulation, patient desats to 90%.  Given patient's generalized weakness and shortness of breath on exertion patient that typically lives independently along with moderate pleural effusions.  Discussed potential admission for further management with patient and family.  They are agreeable with plan.   2252 SpO2(S): 90 %         ____________________________________________    The case was discussed with the attending physician, who is in agreement with the above assessment and plan.      History     Chief Complaint  Chief Complaint   Patient presents with    Evaluation of Abnormal EKG       HPI   [name] is a 78 y.o. female with past medical history as below who presents for the evaluation of an abnormal EKG . The patient's family member at bedside reports that the patient was told that she was in atrial fibrillation after her UC visit today. She reports the patient has been experiencing 2 weeks of dyspnea and 1 week of generalized fatigue and mild DOE. She additionally notes that the patient began experiencing dizziness this morning, which is why they decided to present to UC -- see chart review below for details of this encounter. Of note, she  reports that her husband was recently in the hospital for an extended period of time, and due to this she developed lower extremity edema and a blister on the left foot. Upon interview, she states that she is currently cancer-free and is not undergoing any active treatment! She denies EtOH use in the last 7 years. She denies tobacco or recreational drug use. Denies localized pain, nausea, emesis, fevers, cough, reduction of fluid or reduction of PO intake.     Per chart review, the patient was seen at UC this morning (02/29/2024) for the evaluation of continued dyspnea. The patient's labs at this time was remarkable for platelet count (121) and BG (118). Her EKG at this time showed the patient was in A-fib, with a ventricular rate of 68 BPM.     Outside Historian(s): I have obtained additional history/collateral from the patient's daughter at bedside.    External Records Reviewed: I have reviewed [facility] 02/29/2024 and [facility] 09/17/2023 for past medical history.    Past Medical History[1]    Past Surgical History[2]      Current Facility-Administered Medications:     acetaminophen  (TYLENOL ) tablet 650 mg, 650 mg, Oral, Q6H PRN, Debarah Redell RAMAN, MD    aluminum-magnesium  hydroxide-simethicone (MAALOX MAX) 80-80-8 mg/mL oral suspension, 30 mL, Oral, Q4H PRN, Debarah Redell RAMAN, MD    anastrozole  (ARIMIDEX ) tablet 1 mg, 1 mg, Oral, Daily, Debarah Redell RAMAN, MD, 1 mg at 03/01/24 0844    bacitracin ointment, , Topical, BID, Trudy Wanda Dragon, MD, Given at 03/01/24 [phone]    calcium carbonate tablet 600 mg elem calcium, 600 mg elem calcium, Oral, Daily, Debarah Redell RAMAN, MD, 600 mg elem calcium at 03/01/24 9155    carvedilol  (COREG ) tablet 3.125 mg, 3.125 mg, Oral, BID, Debarah Redell RAMAN, MD, 3.125 mg at 03/01/24 0844    enoxaparin (LOVENOX) syringe 40 mg, 40 mg, Subcutaneous, Nightly, Debarah Redell RAMAN, MD, 40 mg at 03/01/24 0116    furosemide (LASIX) injection 40 mg, 40 mg, Intravenous, Daily, Trudy Wanda Dragon, MD, 40 mg at 03/01/24 9074    guaiFENesin (ROBITUSSIN) oral syrup, 200 mg, Oral, Q4H PRN, Debarah Redell RAMAN, MD    magnesium  oxide (MAG-OX) tablet 400 mg, 400 mg, Oral, BID, Alveria Neptune, MD, 400 mg at 03/01/24 1032    magnesium  sulfate in D5W 1 gram/100 mL infusion 1 g, 1 g, Intravenous, Once, Wendi Coe, Raynell, MD    melatonin tablet 3 mg, 3 mg, Oral, Nightly PRN, Debarah Redell RAMAN, MD    [COMPLETED] potassium chloride  10 mEq in 100 mL IVPB, 10 mEq, Intravenous, Once, Last Rate: 100 mL/hr at 03/01/24 1818, 10 mEq at 03/01/24 1818 **FOLLOWED BY** potassium chloride  10 mEq in 100 mL IVPB, 10 mEq, Intravenous, Once **FOLLOWED BY** [DISCONTINUED] potassium chloride  10 mEq in 100 mL IVPB, 10 mEq, Intravenous, Once **FOLLOWED BY** [DISCONTINUED] potassium chloride  10 mEq in 100 mL IVPB, 10 mEq, Intravenous, Once, Wendi Coe, Raynell, MD    potassium chloride  ER tablet 40 mEq, 40 mEq, Oral, Once, Wendi Coe, Raynell, MD    senna NALANI) tablet 2 tablet, 2 tablet, Oral, Nightly PRN, Debarah Redell RAMAN, MD    Allergies  Sulfa (sulfonamide antibiotics), Sulfur , and Aspirin    Family History  Family History[3]    Social History  Short Social History[4]     Physical Exam     VITAL SIGNS:      Vitals:    03/01/24 0110 03/01/24 0150 03/01/24 0844 03/01/24 0900  BP: 138/86  130/80    Pulse: 85 83 85 75   Resp: 16   16   Temp: 35.9 ??C (96.7 ??F)   35.7 ??C (96.3 ??F)   TempSrc: Tympanic   Tympanic   SpO2: 95%   96%   Weight:    91.3 kg (201 lb 3.2 oz)   Height:           Constitutional: Alert and oriented. No acute distress.  Eyes: Conjunctivae are normal.  HEENT: Normocephalic and atraumatic. Conjunctivae clear. No congestion. Moist mucous membranes.   Cardiovascular: Rate as above, regular rhythm. Normal and symmetric distal pulses. Brisk capillary refill. Normal skin turgor. Palpable pulses in all extremities.   Respiratory: Normal respiratory effort. Breath sounds are normal. There are no wheezing or crackles heard.  Gastrointestinal: Soft, non-distended, non-tender.  Genitourinary: Deferred.  Musculoskeletal: Non-tender with normal range of motion in all extremities.  Neurologic: Normal speech and language. No gross focal neurologic deficits are appreciated. Patient is moving all extremities equally, face is symmetric at rest and with speech.  Skin: Skin is warm, dry and intact. No rash noted. Wound present between the 1st and 2nd digits of the left foot; warm, well-perfused left foot.   Psychiatric: Mood and affect are normal. Speech and behavior are normal.     Radiology     Echocardiogram W Colorflow Spectral Doppler With Contrast   Final Result      CTA Chest W Contrast   Final Result   1. No evidence of pulmonary embolism.   2. Enlarged cardiac atria. Enlarged central pulmonary arteries, likely reflecting pulmonary arterial hypertension.   3. Small to moderate right and small left pleural effusions. Diffuse body wall edema.   4. Hepatic cirrhosis.   5. Severe T12 vertebral compression deformity, described above and developed since a prior study performed on 10/20/2022.      XR Foot 3 Or More Views Left   Final Result   1.  Diffuse soft tissue swelling about the foot without evidence of osteomyelitis or acute acute osseous abnormality.    2.  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            XR Chest Portable   Final Result   Pulmonary vascular congestion. No focal consolidation.                      Pertinent labs & imaging results that were available during my care of the patient were independently interpreted by me and considered in my medical decision making (see chart for details).    Portions of this record have been created using Scientist, clinical (histocompatibility and immunogenetics). Dictation errors have been sought, but may not have been identified and corrected.    Documentation assistance was provided by Norleen Feather, Scribe on February 29, 2024 at 4:17 PM for Rochele Pizza, MD.    Documentation assistance provided by the scribe. I was present during the time the encounter was recorded. The information recorded by the scribe was done at my direction and has been reviewed and validated by me.         [1]   Past Medical History:  Diagnosis Date    Alcoholism    (CMS-HCC)     Alcoholism /alcohol abuse     Cirrhosis    (CMS-HCC)     Depression     Hypertension     Liver disease     Malignant neoplasm of overlapping sites of right breast in female,  estrogen receptor positive    (CMS-HCC) 10/03/2020   [2]   Past Surgical History:  Procedure Laterality Date    BREAST BIOPSY Right     benign-a long time ago    BREAST BIOPSY Right 07/2020    malignant    BREAST LUMPECTOMY Right     4 2022    CHOLECYSTECTOMY      PR BX/REMV,LYMPH NODE,DEEP AXILL Right 09/03/2020    Procedure: BX/EXC LYMPH NODE; OPEN, DEEP AXILRY NODE;  Surgeon: Alm Elsie Como, MD;  Location: [facility];  Service: Surgical Oncology Breast    PR INTRAOPERATIVE SENTINEL LYMPH NODE ID W DYE INJECTION Right 09/03/2020    Procedure: INTRAOPERATIVE IDENTIFICATION SENTINEL LYMPH NODE(S) INCLUDE INJECTION NON-RADIOACTIVE DYE, WHEN PERFORMED;  Surgeon: Alm Elsie Como, MD;  Location: ASC OR [facility];  Service: Surgical Oncology Breast    PR MASTECTOMY, PARTIAL Right 09/03/2020    Procedure: MASTECTOMY, PARTIAL (EG, LUMPECTOMY, TYLECTOMY, QUADRANTECTOMY, SEGMENTECTOMY);  Surgeon: Alm Elsie Como, MD;  Location: ASC OR [facility];  Service: Surgical Oncology Breast    PR UPPER GI ENDOSCOPY,BIOPSY N/A 02/03/2018    Procedure: UGI ENDOSCOPY; WITH BIOPSY, SINGLE OR MULTIPLE;  Surgeon: Eleanor Dewey Sorrel, MD;  Location: [facility] PROCEDURES [facility];  Service: Gastroenterology    RADIATION Right     unsure finish date    TUBAL LIGATION     [3]   Family History  Problem Relation Age of Onset    Heart disease Mother     No Known Problems Father     No Known Problems Sister     No Known Problems Daughter     No Known Problems Maternal Grandmother     No Known Problems Maternal Grandfather     No Known Problems Paternal Grandmother     No Known Problems Paternal Grandfather     Lupus Brother     No Known Problems Other     BRCA 1/2 Neg Hx     Breast cancer Neg Hx     Cancer Neg Hx     Colon cancer Neg Hx     Endometrial cancer Neg Hx     Ovarian cancer Neg Hx     Mental illness Neg Hx     Substance Abuse Disorder Neg Hx    [4]   Social History  Tobacco Use    Smoking status: Never     Passive exposure: Past    Smokeless tobacco: Never   Vaping Use    Vaping status: Never Used   Substance Use Topics    Alcohol use: Not Currently    Drug use: Never        Teresa Rochele BRAVO, MD  Resident  03/01/24 [id number]

## 2024-02-29 NOTE — Unmapped (Signed)
 Internal Medicine (MEDM) History & Physical    Assessment & Plan:   [name] is a 78 y.o. female who is presenting to [facility] with Atrial fibrillation    (CMS-HCC), in the setting of the following pertinent/contributing co-morbidities: cirrhosis, hypertension breast cancer s/p partial mastectomy .    Principal Problem:    Atrial fibrillation    (CMS-HCC)  Active Problems:    HTN (hypertension)    Malignant neoplasm of overlapping sites of right breast in female, estrogen receptor positive    (CMS-HCC)    Pleural effusion    Localized edema    Dyspnea    Vertebral compression fracture    (CMS-HCC)        Active Problems    Pitting Lower Extremity Edema - Dyspnea - R>L Pleural Effusions - Elevated PRO-BNP - Hx of HTN  C/f Heart Failure  Presenting with several weeks or worsening lower extremity edema, orthopnea, and dyspnea. Imaging with signs of pulmonary congestion and bilateral pleural effusions (R>L). No PE on CTA. On exam 3+ pitting edema bilaterally with diminished breath sounds in bilateral bases and fine crackles. Resting comfortably on room air in the ED but with reported hypoxia with desats to the 80s on ambulation. PRO-BNP elevated to 846. HsTroponin 3. History of HTN that has been treated with coreg  and amlodipine . No known CAD history though significant family history of such (MI in Mom, sister, and brother). Will pursue echocardiogram and provide diuresis in setting of suspected heart failure.  -Lasix 20 mg PO x1; assess efficacy and consider further titration   -Strict I/O  -Echocardiogram  -Continue home coreg  3.125 mg daily  -Continue home amlodipine  10 mg daily; consider new medication pending echo   -6 MWT in the AM  -Cardiac risk stratification labs with A1c, lipid panel, TSH    New Onset Atrial Fibrillation  New onset atrial fibrillation noted today on EKG. Se denied palpitation sensation recently or previous history.. CHA2DS2VASC 4 (age +65. Female, Hx of HTN). HR in 70s and hemodynamically stable fortunately. Will continue home coreg  and monitor on telemetry. Given unclear onset, would likely need TEE prior to cardioversion and period of anticoagulation. Discussed with cardiology fellow overnight who noted anticipated initiation of anticoagulation given CHADSVASC score,  but did not feel strongly that heparin drip was needed urgently given anticipated diuresis before consideration of cardioversion. Will plan for pharmacy inquiry regarding DOAC cost in the AM.   -Telemetry  -TSH as above  -Continue home coreg  3.125 mg daily  -Explore DOAC cost with pharmacy and anticipate starting if affordable given no other procedures anticipated vs warfarin  -Consider cardiology consultation for determination of cardioversion candidacy/timeline    Vertebral Compression Fracture  T12 vertebral compression fracture with retropulsion into the ventral spinal canal on CT. She denies back pain or known trauma aside from a fall down 6 stairs 6 months ago. No tenderness on palpation. Has known history of osteoporosis identified on Dexa scan of femoral neck (11/2022 T score -2.9). Currently on vitamin D, calcium, and alendronate .   -Continue home calcium 600 mg daily  -Continue home alendronate  (patient unsure of medication schedule and requested call to daughter [name] in AM to clarify which day she takes)    Lower Extremity Blister  Signs of burst blister on LLE with erythema. Not significantly more warm than surrounding area, so lower concern for cellulitis. X-ray without signs of osteomyelitis. May consider antibiotics if worsening signs of infection noted. See below for picture available in  chart from note on 9/29 at urgent care on day of admission.     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 Previously followed with hepatology but lost to follow up in 2020. Formerly prescribed lactulose  but has not been on it in several years. Platelets 132 on admission though INR 1.13. Some issues with memory of recent events but low concern for HE at this time.   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)  -Continue anastrozole  1 mg daily      The patient's presentation is complicated by the following clinically significant conditions requiring additional evaluation and treatment: - Thrombocytopenia requiring further investigation or monitor  - Hypoxia requiring further investigation, treatment, or monitoring         Issues Impacting Complexity of Management:  -New start anti-coagulation with high risk of bleeding given Liver dysfunction and thrombocytopenia  -Need for the following intensive monitoring parameter(s) due to high risk of clinical decline: telemetry      Medical Decision Making: Reviewed records from the following unique sources  [facility] urgent care; [facility] hepatology, [facility] oncology.      Checklist:  Diet: Regular Diet  DVT PPx: Lovenox 40mg  q24h  Code Status: Full Code  Dispo: Patient appropriate for Observation based on expectation of ongoing need for hospitalization less than two midnights and/or low intensity of services provided    Team Contact Information:   Primary Team: Internal Medicine (MEDM)  Primary Resident: [name] GORMAN Devonshire, MD  Resident's Pager: [phone] [facility] Senior Resident)    Chief Concern:   Atrial fibrillation    (CMS-HCC)      Subjective:   [name] is a 78 y.o. female with pertinent PMHx of cirrhosis and htn presenting with dyspnea         History obtained by discussion with patient and daughter [name] as patient expressed some confusion on precise timeline.       HPI:  [name] presented today after being directed the emergency department from urgent care.    She notes that she has had issues with increased fatigue and dyspnea for 1 to 2 weeks.  She notes she has never had issues with shortness of breath before.  She is very tired after recent prolonged hospitalization for her husband who is completing a rehab stay now.  During that time and that he was in the hospital she developed significant lower extremity as she was standing for long periods of time.  She eventually formed a fluid-filled blister on her left lower extremity that burst and now has led to erythema near her 1st and 2nd toe.    She notes she has never been diagnosed with asthma/COPD and has no previous smoking history.  She has to go up and down stairs at her home and has not particularly noticed shortness of breath while doing this.    She notes that her past medical history is remarkable for alcohol use that she stopped after she was diagnosed with cirrhosis previously.    Pertinent Surgical Hx  Cholecystectomy  Tubal ligation    Pertinent Family Hx  Her mother, sister, and brother have all had myocardial infarctions (her brother first had a heart attack at age 13)    Pertinent Social Hx   She lives in [place]  with her husband, daughter, and granddaughter  She has never smoked  She previously  drank alcohol but has not had a drink in over 6 years after diagnosis of liver issues  She denied any other drug use  Allergies  Sulfa (sulfonamide antibiotics), Sulfur , and Aspirin    In the ED:  Vitals: Temp 34.6, HR 67, BP 122/55, RR 22, SpO2 of 96% on RA  Labs:   BMP: Remarkable for Na to 148  HFP remarkable for Bilirubin of 1.4 and AST of 54  CBC remarkable for platelets of 132  PRO-BNP 846  Hstroponin 3  Cultures: N/A  EKG: Atrial Fibrillation  Imaging:   1. No evidence of pulmonary embolism.   2. Enlarged cardiac atria. Enlarged central pulmonary arteries, likely reflecting pulmonary arterial hypertension.   3. Small to moderate right and small left pleural effusions. Diffuse body wall edema.   4. Hepatic cirrhosis.   5. Severe T12 vertebral compression deformity, described above and developed since a prior study performed on 10/20/2022     Left Foot Xray  1.  Diffuse soft tissue swelling about the foot without evidence of osteomyelitis or acute acute osseous abnormality.   2.  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     CXR  Pulmonary vascular congestion. No focal consolidation.         I reviewed the Medication List.  The current list is accurate with the exception of nystatin  powder which she is no longer using and lactulose  which she has not taken in many years.  Prior to Admission medications   Medication Dose, Route, Frequency   acetaminophen  (TYLENOL ) 325 MG tablet 650 mg, Oral, Every 6 hours PRN   alendronate  (FOSAMAX ) 70 MG tablet 70 mg, Oral, Every 7 days   anastrozole  (ARIMIDEX ) 1 mg tablet 1 mg, Oral, Daily (standard)   calcium carbonate 1,500 mg (600 mg elem calcium) tablet 1 tablet, Daily (standard)   carvedilol  (COREG ) 3.125 MG tablet 3.125 mg, Oral, 2 times a day (standard)   cyanocobalamin 1000 MCG tablet 1,000 mcg, Daily (standard)   magnesium  oxide (MAG-OX) 400 mg (241.3 mg elemental magnesium ) tablet 400 mg, Oral, 2 times a day (standard)   nystatin  (MYCOSTATIN ) 100,000 unit/gram powder Apply to affected area 3 times daily   vitamin E-268 mg, 400 UNIT, 268 mg (400 UNIT) capsule 268 mg, Daily (standard)       Designated Healthcare Decision Maker:  Ms. Rossetti currently has decisional capacity for healthcare decision-making and is able to designate a surrogate healthcare decision maker. Ms. Mcadams designated healthcare decision maker(s) is/are Rosena Bartle (daughter) and [name] (daughter) (the patient's adult child) as denoted by stated patient preference.    Objective:   Physical Exam:  Temp:  [34.6 ??C (94.3 ??F)-35.9 ??C (96.7 ??F)] 35.9 ??C (96.7 ??F)  Pulse:  [67-85] 83  SpO2 Pulse:  [74-90] 83  Resp:  [16-28] 16  BP: (122-153)/(55-86) 138/86  SpO2:  [90 %-96 %] 95 %    Gen: NAD, converses   Eyes: Sclera anicteric, EOMI grossly normal   HENT: Atraumatic, normocephalic  Neck: Trachea midline  Heart: Irregularly irregular  Lungs: Diminished breath sounds over bilateral lower lung fields with fine crackles  Abdomen: Soft, nontender  Extremities: 3+ Pitting edema in bilateral lower extremities. Erythema over site of burst blister between 1st and 2nd toe of right foot with some increased warmth.   Neuro: Grossly symmetric, non-focal . Some issues with recalling recent events and deferral to daughter for help  Skin:  No rashes, lesions on clothed exam  Psych: Alert, oriented        (  Photo taken by Venetia Gee, NP on 9/29 at [facility])

## 2024-02-29 NOTE — Unmapped (Signed)
 Upcoming Appt:  Future Appointments   Date Time Provider Department Center   03/17/2024 12:30 PM Quin [name], FNP [facility]   07/26/2024  9:00 AM [facility] M[facility]MO RM 1 [facility] [place] - [facility]   07/26/2024 10:00 AM Pearlstein, [name], MD [facility]       Disposition:  See Physician Within 4 Hours (or PCP Triage)    Encounter Reason for Disposition:    [1] MILD difficulty breathing (e.g., minimal/no SOB at rest, SOB with walking, pulse < 100) AND [2] NEW-onset or WORSE than normal      Is this a pediatric patient?   No     Any recent, relevant visit?   No    Any relevant medical history?   No    Any interventions?   No      Dispatch [facility] Eligibility    Patient Dispatch [facility] eligible (according to most recent zip code and insurance information)? Yes  Address on file: [address]  [place] [address]  Insurance on file: [place] MEDICARE ADV  Member ID: [id number]  Subscriber Name: [name]    Verify home address (P.O. Box addresses will not be accepted) & insurance information with the patient before placing Dispatch referral. If the information on file is not up to date, do not place Dispatch referral.    Patient is [facility]patch [facility] eligible and appropriate for referral: No     Encounter Initial Assessment:  1. RESPIRATORY STATUS: Describe your breathing? (e.g., wheezing, shortness of breath, unable to speak, severe coughing)       Mild SOB with exertion onset this morning  2. ONSET: When did this breathing problem begin?       The past week 9/22  3. PATTERN Does the difficult breathing come and go, or has it been constant since it started?       Intermittent  4. SEVERITY: How bad is your breathing? (e.g., mild, moderate, severe)       Mild  5. RECURRENT SYMPTOM: Have you had difficulty breathing before? If Yes, ask: When was the last time? and What happened that time?       NO  6. CARDIAC HISTORY: Do you have any history of heart disease? (e.g., heart attack, angina, bypass surgery, angioplasty)       No  7. LUNG HISTORY: Do you have any history of lung disease?  (e.g., pulmonary embolus, asthma, emphysema)      No  8. CAUSE: What do you think is causing the breathing problem?       No idea  9. OTHER SYMPTOMS: Do you have any other symptoms? (e.g., chest pain, cough, dizziness, fever, runny nose)      Mild dizziness with exertion-blister on great toe and across to the first 3 toes-broken now-very red, 1/10  10. O2 SATURATION MONITOR:  Do you use an oxygen saturation monitor (pulse oximeter) at home? If Yes, ask: What is your reading (oxygen level) today? What is your usual oxygen saturation reading? (e.g., 95%)        93 % RA  11. PREGNANCY: Is there any chance you are pregnant? When was your last menstrual period?        Not asked  12. TRAVEL: Have you traveled out of the country in the last month? (e.g., travel history, exposures)        No travel, no known exposures      Encounter Protocols Used:  Breathing Difficulty-A-AHUpcoming Appt:  Future Appointments  Date Time Provider Department Center   03/17/2024 12:30 PM Quin [name], FNP [facility]   07/26/2024  9:00 AM [facility] MAMMO RM 1 [facility] [place] - [facility]   07/26/2024 10:00 AM Pearlstein, [name], MD [facility]       Disposition:  See Physician Within 4 Hours (or PCP Triage)    Encounter Reason for Disposition:    [1] MILD difficulty breathing (e.g., minimal/no SOB at rest, SOB with walking, pulse < 100) AND [2] NEW-onset or WORSE than normal      Is this a pediatric patient?   No     Any recent, relevant visit?   No    Any relevant medical history?   No    Any interventions?   No      Dispatch [facility] Eligibility    Patient Dispatch [facility] eligible (according to most recent zip code and insurance information)? Yes  Address on file: [address]  [place] [address]  Insurance on file: [place] MEDICARE ADV  Member ID: [id number]  Subscriber Name: [name]    Verify home address (P.O. Box addresses will not be accepted) & insurance information with the patient before placing Dispatch referral. If the information on file is not up to date, do not place Dispatch referral.    Patient is Dispatch [facility] eligible and appropriate for referral: No     Encounter Initial Assessment:  1. RESPIRATORY STATUS: Describe your breathing? (e.g., wheezing, shortness of breath, unable to speak, severe coughing)       Mild SOB with exertion onset this morning  2. ONSET: When did this breathing problem begin?       The past week 9/22  3. PATTERN Does the difficult breathing come and go, or has it been constant since it started?       Intermittent  4. SEVERITY: How bad is your breathing? (e.g., mild, moderate, severe)       Mild  5. RECURRENT SYMPTOM: Have you had difficulty breathing before? If Yes, ask: When was the last time? and What happened that time?       NO  6. CARDIAC HISTORY: Do you have any history of heart disease? (e.g., heart attack, angina, bypass surgery, angioplasty)       No  7. LUNG HISTORY: Do you have any history of lung disease?  (e.g., pulmonary embolus, asthma, emphysema)      No  8. CAUSE: What do you think is causing the breathing problem?       No idea  9. OTHER SYMPTOMS: Do you have any other symptoms? (e.g., chest pain, cough, dizziness, fever, runny nose)      Mild dizziness with exertion-blister on great toe and across to the first 3 toes-broken now-very red, 1/10  10. O2 SATURATION MONITOR:  Do you use an oxygen saturation monitor (pulse oximeter) at home? If Yes, ask: What is your reading (oxygen level) today? What is your usual oxygen saturation reading? (e.g., 95%)        93 % RA  11. PREGNANCY: Is there any chance you are pregnant? When was your last menstrual period?        Not asked  12. TRAVEL: Have you traveled out of the country in the last month? (e.g., travel history, exposures)        No travel, no known exposures      Encounter Protocols Used:  Breathing Difficulty-A-AHUpcoming Appt:  Future Appointments   Date Time Provider Department Center  03/17/2024 12:30 PM Quin [name], FNP [facility]   07/26/2024  9:00 AM [facility] MAMMO RM 1 [facility] [place] - [facility]   07/26/2024 10:00 AM Pearlstein, [name], MD [facility]       Disposition:  See Physician Within 4 Hours (or PCP Triage)    Encounter Reason for Disposition:    [1] MILD difficulty breathing (e.g., minimal/no SOB at rest, SOB with walking, pulse < 100) AND [2] NEW-onset or WORSE than normal      Is this a pediatric patient?   No     Any recent, relevant visit?   No    Any relevant medical history?   Yes     What history?  HTN    Any interventions?   Yes     What has been done? (include related medication given, dose, and date/time  8:10 am b/p 154/98, 138/109  9/19 8:30 am 9/29 Amlodipine  5 mg., 3.125 Carvedilol        Dispatch Health Eligibility    Patient Dispatch Health eligible (according to most recent zip code and insurance information)? Yes  Address on file: [address]  [name] [address]  Insurance on file: [place] MEDICARE ADV  Member ID: [id number]  Subscriber Name: [name]    Verify home address (P.O. Box addresses will not be accepted) & insurance information with the patient before placing Dispatch referral. If the information on file is not up to date, do not place Dispatch referral.    Patient is [facility] eligible and appropriate for referral: No     Encounter Initial Assessment:  1. RESPIRATORY STATUS: Describe your breathing? (e.g., wheezing, shortness of breath, unable to speak, severe coughing)       Mild SOB with exertion onset this morning  2. ONSET: When did this breathing problem begin?       The past week 9/22  3. PATTERN Does the difficult breathing come and go, or has it been constant since it started?       Intermittent  4. SEVERITY: How bad is your breathing? (e.g., mild, moderate, severe)       Mild  5. RECURRENT SYMPTOM: Have you had difficulty breathing before? If Yes, ask: When was the last time? and What happened that time?       NO  6. CARDIAC HISTORY: Do you have any history of heart disease? (e.g., heart attack, angina, bypass surgery, angioplasty)       No  7. LUNG HISTORY: Do you have any history of lung disease?  (e.g., pulmonary embolus, asthma, emphysema)      No  8. CAUSE: What do you think is causing the breathing problem?       No idea  9. OTHER SYMPTOMS: Do you have any other symptoms? (e.g., chest pain, cough, dizziness, fever, runny nose)      Mild dizziness with exertion-blister on great toe and across to the first 3 toes-broken now-very red, 1/10  10. O2 SATURATION MONITOR:  Do you use an oxygen saturation monitor (pulse oximeter) at home? If Yes, ask: What is your reading (oxygen level) today? What is your usual oxygen saturation reading? (e.g., 95%)        93 % RA  11. PREGNANCY: Is there any chance you are pregnant? When was your last menstrual period?        Not asked  12. TRAVEL: Have you traveled out of the country in the last month? (e.g., travel history, exposures)  No travel, no known exposures      Encounter Protocols Used:  Breathing Difficulty-A-AHUpcoming Appt:  Future Appointments   Date Time Provider Department Center   03/17/2024 12:30 PM Quin [name], FNP [facility]   07/26/2024  9:00 AM [facility] MAMMO RM 1 [facility]MO [place] - [facility]   07/26/2024 10:00 AM Pearlstein, [name], MD [facility]       Disposition:  See Physician Within 4 Hours (or PCP Triage)    Encounter Reason for Disposition:    [1] MILD difficulty breathing (e.g., minimal/no SOB at rest, SOB with walking, pulse < 100) AND [2] NEW-onset or WORSE than normal      Is this a pediatric patient?   No     Any recent, relevant visit?   No    Any relevant medical history?   No    Any interventions?   No      Dispatch [facility] Eligibility    Patient Dispatch [facility] eligible (according to most recent zip code and insurance information)? Yes  Address on file: [address]  [place] [address]  Insurance on file: [place] MEDICARE ADV  Member ID: [id number]  Subscriber Name: [name]    Verify home address (P.O. Box addresses will not be accepted) & insurance information with the patient before placing Dispatch referral. If the information on file is not up to date, do not place Dispatch referral.    Patient is [facility] eligible and appropriate for referral: No     Encounter Initial Assessment:  1. RESPIRATORY STATUS: Describe your breathing? (e.g., wheezing, shortness of breath, unable to speak, severe coughing)       Mild SOB with exertion onset this morning  2. ONSET: When did this breathing problem begin?       The past week 9/22  3. PATTERN Does the difficult breathing come and go, or has it been constant since it started?       Intermittent  4. SEVERITY: How bad is your breathing? (e.g., mild, moderate, severe)       Mild  5. RECURRENT SYMPTOM: Have you had difficulty breathing before? If Yes, ask: When was the last time? and What happened that time?       NO  6. CARDIAC HISTORY: Do you have any history of heart disease? (e.g., heart attack, angina, bypass surgery, angioplasty)       No  7. LUNG HISTORY: Do you have any history of lung disease?  (e.g., pulmonary embolus, asthma, emphysema)      No  8. CAUSE: What do you think is causing the breathing problem?       No idea  9. OTHER SYMPTOMS: Do you have any other symptoms? (e.g., chest pain, cough, dizziness, fever, runny nose)      Mild dizziness with exertion-blister on great toe and across to the first 3 toes-broken now-very red, 1/10  10. O2 SATURATION MONITOR:  Do you use an oxygen saturation monitor (pulse oximeter) at home? If Yes, ask: What is your reading (oxygen level) today? What is your usual oxygen saturation reading? (e.g., 95%)        93 % RA  11. PREGNANCY: Is there any chance you are pregnant? When was your last menstrual period?        Not asked  12. TRAVEL: Have you traveled out of the country in the last month? (e.g., travel history, exposures)        No travel, no known exposures  Encounter Protocols Used:  Breathing Difficulty-A-AH

## 2024-02-29 NOTE — Unmapped (Signed)
 ULTRASOUND PIV PROCEDURE NOTE    Indications:   Poor venous access.    Ultrasound guidance was necessary to obtain access.     Procedure Details:  Identity of the patient was confirmed via name, medical record number and date of birth. The availability of the correct equipment was verified.    The vein was identified and measured for ultrasound catheter insertion.       Vein measurement (without tourniquet):   0.37 cm   A(n) 20 gauge 1.88 catheter was selected based on the recommendations below:    [facility] Catheter/Vein Ratio Guidelines    Chart to determine PIV catheter size/length to use based on vein diameter and depth   Catheter Gauge Size (g)  22g 20g 18g   Catheter length (inches)  1.75 1.75 1.75   Catheter diameter measurement (mm) 0.9 mm 1.1 mm 1.3 mm          Minimum required vein diameter       Sonosite (cm)  0.27 cm 0.33 cm 0.39 cm          Maximum vein depth  1.25 cm 1.25 cm 1.25 cm          The field was prepared with necessary supplies and equipment.  Probe cover and sterile gel utilized. Insertion site was prepped with chlorhexidineand allowed to dry.  The catheter extension was primed with normal saline.  The US  PIV was placed in the L Forearm with 1attempt(s). See LDA for additional details.    Catheter aspirated, 5 mL blood return present. The catheter was then flushed with 10 mL of normal saline. Insertion site cleansed, and dressing applied per manufacturer guidelines. The catheter was inserted with difficulty due to poor vasculature by Wyn CHRISTELLA Barrio, RN.    Thank you,     Wyn CHRISTELLA Barrio, RN    Ultrasound Resource Nurse    Workup / Procedure Time:  30 minutes

## 2024-03-01 LAB — BASIC METABOLIC PANEL
ANION GAP: 12 mmol/L (ref 5–14)
ANION GAP: 11 mmol/L (ref 5–14)
BLOOD UREA NITROGEN: 7 mg/dL — ABNORMAL LOW (ref 9–23)
BLOOD UREA NITROGEN: 8 mg/dL — ABNORMAL LOW (ref 9–23)
BUN / CREAT RATIO: 16
BUN / CREAT RATIO: 18
CALCIUM: 9.2 mg/dL (ref 8.7–10.4)
CALCIUM: 8.9 mg/dL (ref 8.7–10.4)
CHLORIDE: 102 mmol/L (ref 98–107)
CHLORIDE: 98 mmol/L (ref 98–107)
CO2: 33.9 mmol/L — ABNORMAL HIGH (ref 20.0–31.0)
CO2: 36.6 mmol/L — ABNORMAL HIGH (ref 20.0–31.0)
CREATININE: 0.44 mg/dL — ABNORMAL LOW (ref 0.55–1.02)
CREATININE: 0.45 mg/dL — ABNORMAL LOW (ref 0.55–1.02)
EGFR CKD-EPI (2021) FEMALE: >90 mL/min/1.73m2 (ref >=60)
EGFR CKD-EPI (2021) FEMALE: >90 mL/min/1.73m2 (ref >=60)
GLUCOSE RANDOM: 103 mg/dL (ref 70–179)
GLUCOSE RANDOM: 144 mg/dL (ref 70–179)
POTASSIUM: 4 mmol/L (ref 3.4–4.8)
POTASSIUM: 2.9 mmol/L — ABNORMAL LOW (ref 3.4–4.8)
SODIUM: 148 mmol/L — ABNORMAL HIGH (ref 135–145)
SODIUM: 146 mmol/L — ABNORMAL HIGH (ref 135–145)

## 2024-03-01 LAB — HEPATIC FUNCTION PANEL
ALBUMIN: 2.7 g/dL — ABNORMAL LOW (ref 3.4–5.0)
ALKALINE PHOSPHATASE: 134 U/L — ABNORMAL HIGH (ref 46–116)
ALT (SGPT): 12 U/L (ref 10–49)
AST (SGOT): 25 U/L (ref <=34)
BILIRUBIN DIRECT: 0.5 mg/dL — ABNORMAL HIGH (ref 0.00–0.30)
BILIRUBIN TOTAL: 1.2 mg/dL (ref 0.3–1.2)
PROTEIN TOTAL: 6 g/dL (ref 5.7–8.2)

## 2024-03-01 LAB — CBC
HEMATOCRIT: 38.7 % (ref 34.0–44.0)
HEMOGLOBIN: 13.1 g/dL (ref 11.3–14.9)
MEAN CORPUSCULAR HEMOGLOBIN CONC: 33.8 g/dL (ref 32.0–36.0)
MEAN CORPUSCULAR HEMOGLOBIN: 33 pg — ABNORMAL HIGH (ref 25.9–32.4)
MEAN CORPUSCULAR VOLUME: 97.6 fL — ABNORMAL HIGH (ref 77.6–95.7)
MEAN PLATELET VOLUME: 7.7 fL (ref 6.8–10.7)
PLATELET COUNT: 104 10*9/L — ABNORMAL LOW (ref 150–450)
RED BLOOD CELL COUNT: 3.96 10*12/L (ref 3.95–5.13)
RED CELL DISTRIBUTION WIDTH: 15.6 % — ABNORMAL HIGH (ref 12.2–15.2)
WBC ADJUSTED: 3.8 10*9/L (ref 3.6–11.2)

## 2024-03-01 LAB — MAGNESIUM
MAGNESIUM: 2 mg/dL (ref 1.6–2.6)
MAGNESIUM: 1.9 mg/dL (ref 1.6–2.6)

## 2024-03-01 LAB — LIPID PANEL
CHOLESTEROL: 122 mg/dL (ref <200)
HDL CHOLESTEROL: 54 mg/dL (ref >50)
LDL CHOLESTEROL CALCULATED: 60 mg/dL (ref <100)
NON-HDL CHOLESTEROL: 68 mg/dL (ref <130)
TRIGLYCERIDES: 58 mg/dL (ref <150)

## 2024-03-01 LAB — HEMOGLOBIN A1C
ESTIMATED AVERAGE GLUCOSE: 91 mg/dL
HEMOGLOBIN A1C: 4.8 % (ref 4.8–5.6)

## 2024-03-01 LAB — PROTIME-INR
INR: 1.25
PROTIME: 14.2 s — ABNORMAL HIGH (ref 9.9–12.6)

## 2024-03-01 LAB — AMMONIA: AMMONIA: 31 umol/L (ref 11–32)

## 2024-03-01 MED ORDER — APIXABAN 5 MG TABLET
ORAL_TABLET | Freq: Two times a day (BID) | ORAL | 0 refills | 30.00000 days | Status: CP
Start: 2024-03-01 — End: 2024-03-01

## 2024-03-01 MED ADMIN — anastrozole (ARIMIDEX) tablet 1 mg: 1 mg | ORAL | @ 13:00:00

## 2024-03-01 MED ADMIN — potassium chloride ER tablet 40 mEq: 40 meq | ORAL | @ 22:00:00 | Stop: 2024-03-01

## 2024-03-01 MED ADMIN — furosemide (LASIX) injection 40 mg: 40 mg | INTRAVENOUS | @ 13:00:00

## 2024-03-01 MED ADMIN — furosemide (LASIX) tablet 20 mg: 20 mg | ORAL | @ 05:00:00 | Stop: 2024-03-01

## 2024-03-01 MED ADMIN — magnesium oxide (MAG-OX) tablet 400 mg: 400 mg | ORAL | @ 15:00:00

## 2024-03-01 MED ADMIN — perflutren protein type-A microspheres (OPTISON) injection 3 mL: 3 mL | INTRAVENOUS | @ 12:00:00 | Stop: 2024-03-01

## 2024-03-01 MED ADMIN — carvedilol (COREG) tablet 3.125 mg: 3.125 mg | ORAL | @ 05:00:00

## 2024-03-01 MED ADMIN — potassium chloride 10 mEq in 100 mL IVPB: 10 meq | INTRAVENOUS | @ 22:00:00 | Stop: 2024-03-01

## 2024-03-01 MED ADMIN — enoxaparin (LOVENOX) syringe 40 mg: 40 mg | SUBCUTANEOUS | @ 05:00:00

## 2024-03-01 MED ADMIN — iohexol (OMNIPAQUE) 350 mg iodine/mL solution 75 mL: 75 mL | INTRAVENOUS | @ 02:00:00 | Stop: 2024-02-29

## 2024-03-01 MED ADMIN — calcium carbonate tablet 600 mg elem calcium: 600 mg | ORAL | @ 13:00:00

## 2024-03-01 MED ADMIN — bacitracin ointment: TOPICAL | @ 13:00:00

## 2024-03-01 MED ADMIN — carvedilol (COREG) tablet 3.125 mg: 3.125 mg | ORAL | @ 13:00:00

## 2024-03-01 NOTE — Unmapped (Signed)
 OCCUPATIONAL THERAPY  Evaluation (03/01/24 1438)    Patient Name:  [name]       Medical Record Number: [id number]     Date of Birth: 05/07/1946  Sex: Female      Post-Discharge Occupational Therapy Recommendations: 3x weekly          Equipment Recommendation  OT DME Recommendations: None       OT Treatment Diagnosis: Need for assistance with personal care         Assessment  Assessment: [name] is a 78 y.o. female who is presenting to [facility] with Atrial fibrillation, in the setting of the following pertinent/contributing co-morbidities: cirrhosis, hypertension breast cancer s/p partial mastectomy. Pt reports at baseline she was independent with ADLs and ADL transfers without AD. Pt lives with husband, daughter, and granddaughter. No recent falls. Therapist educated pt on role of OT, OT POC, and safety during functional transfers. Pt is currently functioning below baseline, requiring min A for bed mobility, CGA for functional transfer sit <> stand using HHA, CGA for functional mobility x10 feet x2 trials using HHA, min A for LE dressing, and CGA for toileting and standing grooming tasks due to demonstrated deficits including decreased safety awareness, decreased balance, and decreased strength decreasing independence. Skilled acute care OT recommended to address these deficits and maximize independence. Recommend 3x at post acute DC. After review of the patient's occupational profile and history, assessment of occupational performance, clinical decision making, and development of POC, the patient presents as a moderate complexity case. Based on the daily activity AM-PAC raw score of 19/24, the patient is considered to be 42.3% impaired care.    Problem List: Decreased safety awareness, Impaired balance, Decreased strength, Fall risk, Impaired ADLs  Personal Factors/Comorbidities (Occupational Profile and History Review): Expanded (Moderate)  Assessment of Occupational Performance : Balance, Cognitive skills, Endurance, Fine or gross motor coordination, Mobility, Sensation, Strength  Clinical Decision Making: Moderate Complexity    Today's Interventions: ADL retraining, Balance activities, Compensatory tech. training, Conservation, Education - Patient, Endurance activities, Functional mobility, Range of motion, Positioning, Transfer training, Safety education  Today's Interventions: endurance training, strength training, balance training, functional transfers, functional mobility, toileting, standing grooming, LE dressing    Activity Tolerance During Today's Session  Tolerated treatment well    Plan  Planned Frequency of Treatment: Plan of Care Initiated: 03/01/24  1-2x per day Weekly Frequency: 3-4 days per week  Planned Treatment Duration: 03/08/24    Planned Interventions:  Clinical research associate, Education (Patient/Family/Caregiver), Self-Care/Home Training, Therapeutic Exercise, Therapeutic Activity      GOALS:   Patient and Family Goals: return home    Short Term:   SHORT GOAL #1: Pt will complete toilet transfer and toileting tasks with SBA using LRD   Time Frame : 1 week  SHORT GOAL #2: Pt will complete full body ADL with SBA using LRD   Time Frame : 1 week  SHORT GOAL #3: Pt will complete standing grooming task x5 mins using LRD and SBA   Time Frame : 1 week           Long Term Goal #1: Pt will maximize independence with self care tasks in 8 weeks  Time Frame: 2 months    Prognosis:  Good  Positive Indicators:  PLOF  Barriers to Discharge: Endurance deficits, Impaired Balance    Subjective  Medical Updates Since Last Visit/Relevant PMH Affecting Clinical Decision Making:    Prior Functional Status pt reports at baseline she is  independent with ADLs and ADL transfers without AD. Pt lives with husband who was just DC from SNF, daughter and granddaughter. no recent falls    Living Situation  Living Environment: House  Lives With: Spouse, Daughter, Family  Home Living: Multi-level home, Walk-in shower, Raised toilet seat without rails, Stairs to enter with rails, Stairs to alternate level with rails, Built-in shower seat  Rail placement (outside): Bilateral rails in reach  Number of Stairs to Enter (outside): 5  Rail placement (inside): Bilateral rails in reach  Number of Stairs to Alternate level (inside): 9  Caregiver Identified?: Yes  Caregiver Availability: 24 hours  Caregiver Ability: Supervision     Equipment available at home: Rollator, Rolling walker            Patient / Caregiver reports: I hope I'm only here a few days Pt agreeable to OT session      Past Medical History[1] Social History     Tobacco Use    Smoking status: Never     Passive exposure: Past    Smokeless tobacco: Never   Substance Use Topics    Alcohol use: Not Currently      Past Surgical History[2] Family History[3]     Sulfa (sulfonamide antibiotics), Sulfur , and Aspirin     Objective Findings  Precautions / Restrictions  Falls precautions       Weight Bearing  Non-applicable    Required Braces or Orthoses  Non-applicable    Communication Preference  Verbal       Pain  pt reported no pain    Equipment / Environment  Vascular access (PIV, TLC, Port-a-cath, PICC), Telemetry         Cognition   Orientation Level:  Oriented x 4   Arousal/Alertness:  Appropriate responses to stimuli   Attention Span:  Appears intact   Memory:  Appears intact   Following Commands:  Follows all commands without difficulty   Safety Judgment:  Decreased awareness of need for safety   Awareness of Errors and Problem Solving:  Able to problem solve independently   Comments:      Vision / Hearing   Vision: No acute deficits identified, Glasses present, Wears glasses all the time     Hearing: No deficit identified         Hand Function:  Right Hand Function: Right hand grip strength, ROM and coordination WNL  Left Hand Function: Left hand grip strength, ROM and coordination WNL  Hand Dominance: Right    Skin Inspection:  Skin Inspection: Intact where visualized    Face/Cervical ROM:  Face ROM: WFL  Cervical ROM: WFL    ROM / Strength:  UE ROM/Strength: Left WFL, Right WFL  LE ROM/Strength: Left Impaired/Limited, Right Impaired/Limited  RLE Impairment: Reduced strength  LLE Impairment: Reduced strength    Coordination:  Coordination: WFL    Sensation:  RUE Sensation: RUE intact  LUE Sensation: LUE intact  RLE Sensation: RLE intact  LLE Sensation: LLE intact    Balance:  Static Sitting-Level of Assistance: Stand by Camera operator of Assistance: Clinical biochemist Standing-Level of Assistance: Contact guard  Dynamic Standing - Level of Assistance: Minimum assistance  Standing Balance comments: using HHA    Functional Mobility  Transfers: Contact Guard assist  Bed Mobility - Needs Assistance: Min assist  Ambulation: functional mobility x10 feet x2 trials using HHA and min A for balance, 1 minor LOB pt able to self correct    ADLs  Grooming:  Contact Guard assist, Performed standing  Toileting: Contact Guard assist  UB Dressing: Stand by Assist, Performed seated  LB Dressing: Min assist    Vitals / Orthostatics  Vitals/Orthostatics: HR 84 BPM    Patient at end of session: All needs in reach, In bed, Notified Nurse     Occupational Therapy Session Duration  OT Individual [mins]: 16       AM-PAC-Daily Activity  Lower Body Dressing assistance needs: A Little - Minimal/Contact Guard Assist/Supervision  Bathing assistance needs: A Little - Minimal/Contact Guard Assist/Supervision  Toileting assistance needs: A Little - Minimal/Contact Guard Assist/Supervision  Upper Body Dressing assistance needs: A Little - Minimal/Contact Guard Assist/Supervision  Personal Grooming assistance needs: A Little - Minimal/Contact Guard Assist/Supervision  Eating Meals assistance needs: None - Modified Independent/Independent    Daily Activity Score: 19    Score (in points): % of Functional Impairment, Limitation, Restriction  6: 100% impaired, limited, restricted  7-8: At least 80%, but less than 100% impaired, limited restricted  9-13: At least 60%, but less than 80% impaired, limited restricted  14-19: At least 40%, but less than 60% impaired, limited restricted  20-22: At least 20%, but less than 40% impaired, limited restricted  23: At least 1%, but less than 20% impaired, limited restricted  24: 0% impaired, limited restricted      I attest that I have reviewed the above information.  Signed: Gerard FORBES Lunger, OT  Filed 03/01/2024                 [1]   Past Medical History:  Diagnosis Date    Alcoholism    (CMS-HCC)     Alcoholism /alcohol abuse     Cirrhosis    (CMS-HCC)     Depression     Hypertension     Liver disease     Malignant neoplasm of overlapping sites of right breast in female, estrogen receptor positive    (CMS-HCC) 10/03/2020   [2]   Past Surgical History:  Procedure Laterality Date    BREAST BIOPSY Right     benign-a long time ago    BREAST BIOPSY Right 07/2020    malignant    BREAST LUMPECTOMY Right     4 2022    CHOLECYSTECTOMY      PR BX/REMV,LYMPH NODE,DEEP AXILL Right 09/03/2020    Procedure: BX/EXC LYMPH NODE; OPEN, DEEP AXILRY NODE;  Surgeon: Alm Elsie Como, MD;  Location: [facility];  Service: Surgical Oncology Breast    PR INTRAOPERATIVE SENTINEL LYMPH NODE ID W DYE INJECTION Right 09/03/2020    Procedure: INTRAOPERATIVE IDENTIFICATION SENTINEL LYMPH NODE(S) INCLUDE INJECTION NON-RADIOACTIVE DYE, WHEN PERFORMED;  Surgeon: Alm Elsie Como, MD;  Location: [facility] OR [facility];  Service: Surgical Oncology Breast    PR MASTECTOMY, PARTIAL Right 09/03/2020    Procedure: MASTECTOMY, PARTIAL (EG, LUMPECTOMY, TYLECTOMY, QUADRANTECTOMY, SEGMENTECTOMY);  Surgeon: Alm Elsie Como, MD;  [facility]: [facility];  Service: Surgical Oncology Breast    PR UPPER GI ENDOSCOPY,BIOPSY N/A 02/03/2018    Procedure: UGI ENDOSCOPY; WITH BIOPSY, SINGLE OR MULTIPLE;  Surgeon: Eleanor Dewey Sorrel, MD;  [facility]: [facility];  Service: Gastroenterology    RADIATION Right     unsure finish date    TUBAL LIGATION     [3]   Family History  Problem Relation Age of Onset    Heart disease Mother     No Known Problems Father     No Known Problems Sister  No Known Problems Daughter     No Known Problems Maternal Grandmother     No Known Problems Maternal Grandfather     No Known Problems Paternal Grandmother     No Known Problems Paternal Grandfather     Lupus Brother     No Known Problems Other     BRCA 1/2 Neg Hx     Breast cancer Neg Hx     Cancer Neg Hx     Colon cancer Neg Hx     Endometrial cancer Neg Hx     Ovarian cancer Neg Hx     Mental illness Neg Hx     Substance Abuse Disorder Neg Hx

## 2024-03-01 NOTE — Unmapped (Signed)
 Received from ED, settled into bed, oriented to room and hospital routine. Placed on telemetry--tech reports afib. Heart rate irregular on auscultation, fine crackles to both lungs, +3 pitting edema BLE, POA wound to L 1st & 2nd toe space noted; reports some dyspnea while lying flat. Ambulated to bathroom and voided into toilet immediately on arrival--educated on I&O procedure. Currently AFVSS, SORA.       Problem: Adult Inpatient Plan of Care  Goal: Absence of Hospital-Acquired Illness or Injury  Outcome: Progressing  Goal: Optimal Comfort and Wellbeing  Outcome: Progressing  Goal: Readiness for Transition of Care  Outcome: Progressing  Goal: Rounds/Family Conference  Outcome: Progressing     Problem: Fall Injury Risk  Goal: Absence of Fall and Fall-Related Injury  Outcome: Progressing     Problem: Self-Care Deficit  Goal: Improved Ability to Complete Activities of Daily Living  Outcome: Progressing     Problem: Dysrhythmia  Goal: Normalized Cardiac Rhythm  Outcome: Progressing

## 2024-03-01 NOTE — Unmapped (Signed)
 PHYSICAL THERAPY  Evaluation (03/01/24 1158)          Patient Name:  [name]       Medical Record Number: [id number]   Date of Birth: 1945/12/30  Sex: Female        Post-Discharge Physical Therapy Recommendations:  PT Post Acute Discharge Recommendations: 3x weekly   Equipment Recommendation  PT DME Recommendations: None          Treatment Diagnosis: Abnormalities of gait and mobility, Difficulty in walking, Generalized muscle weakness, Unsteadiness on feet        ASSESSMENT  Problem List: Decreased mobility, Impaired balance, Urinary incontinence, Decreased endurance, Gait deviation, Fall risk      Assessment : [name] is a 78 y.o. female who is presenting to [facility] with Atrial fibrillation    (CMS-HCC), in the setting of the following pertinent/contributing co-morbidities: cirrhosis, hypertension breast cancer s/p partial mastectomy.      Prior to admission pt was IND with functional mobility and self care, lives with family, daughter and granddaughter can assist pt as needed. Pt currently mobilizing under baseline function, presenting to PT with impairments in functional strength, balance, and endurance. Pt mobilizing grossly at minA/CGA level, for tasks including bed mobility, STS transfers, and household distance ambulation. performed, with values as shown:    Oxygen saturation on room air with patient at rest  = 94%  Oxygen saturation on room air with exertion / ambulation = 92%  Oxygen saturation with exertion / ambulating on oxygen = NA, no O2 needs required    Fatigue managed with rest breaks throughout. Pt will benefit from continued therapy services while in-house to address mobility impairments as listed above. Given CLOF, currently recommending 3x for post-acute therapy services to facilitate return to PLOF.       Today's Interventions: Balance activities, Endurance activities, Gait training, Patient/Family/Caregiver Education, Positioning, Therapeutic activity  Today's Interventions: PT eval, bed mobility, STS transfers, amb, vital sign monitoring. Ed re: POC, goals of care, falls prevention, progressive mobility     Personal Factors/Comorbidities Present: 3+ factors   Examination of Body systems: 3+ elements  Clinical Presentation: Evolving    Eval Complexity : Moderate Complexity     Activity Tolerance: Tolerated treatment well       PLAN  Planned Frequency of Treatment: Plan of Care Initiated: 03/01/24  1-2x per day Weekly Frequency: 3-4 days per week        Planned Interventions: Education (Patient/Family/Caregiver), Gait training, Home exercise program, Neuromuscular re-education, Self-care / Home Management training, Therapeutic Exercise, Therapeutic Activity     Goals:   Patient and Family Goals: improve mobility     SHORT GOAL #1: Pt will complete all bed mobility mod I               Time Frame : 2 weeks  SHORT GOAL #2: Pt will complete all STS transfers mod I w LRAD              Time Frame : 2 weeks  SHORT GOAL #3: Pt will amb >100 ft., mod I w LRAD              Time Frame : 2 weeks  SHORT GOAL #4: Pt will negotiate x9 steps with B rails, CGA w LRAD               Time Frame : 2 weeks    Long Term Goal #1: Pt will return to PLOF  Time  Frame: 2 months     Prognosis:  Good  Positive Indicators: PLOF, CLOF  Barriers to Discharge: Endurance deficits     SUBJECTIVE  Communication Preference: Verbal     Patient reports: agreeable to PT  Pain Comments: no c/o pain        Prior Functional Status: IND at home for mobility and self care, lives with spouse, daughter, and granddaughter. Daughter and granddaughter can assist as needed. Mostly sedentary, does not leave the house much. Denies recent falls  Living Situation  Living Environment: House  Lives With: Spouse, Daughter, Family  Home Living: Multi-level home, Walk-in shower, Raised toilet seat without rails, Stairs to enter with rails, Stairs to alternate level with rails  Rail placement (outside): Bilateral rails in reach  Number of Stairs to Enter (outside): 5  Rail placement (inside): Bilateral rails in reach  Number of Stairs to Alternate level (inside): 9      Equipment available at home: Rollator, Rolling walker        Past Medical History[1]         Social History     Tobacco Use    Smoking status: Never     Passive exposure: Past    Smokeless tobacco: Never   Substance Use Topics    Alcohol use: Not Currently       Past Surgical History[2]          Family History[3]     Allergies: Sulfa (sulfonamide antibiotics), Sulfur , and Aspirin                  Objective Findings  Precautions / Restrictions  Precautions: Falls precautions  Weight Bearing Status: Non-applicable  Required Braces or Orthoses: Non-applicable     Medical Tests / Procedures: Reviewed in Epic  Equipment / Environment: Vascular access (PIV, TLC, Port-a-cath, PICC), Telemetry     Vitals/Orthostatics : on RA throughout, SpO2>92% throughout. HR 70s-80s, baseline AFib     Cognition: WFL  Cognition comment: alert and conversational  Visual/Perception: Wears Glasses/Contacts all the time  Hearing: No deficit identified     Skin Inspection: Swelling  Skin Inspection comment: mild swelling B LE     Upper Extremities  UE ROM: Right WFL, Left WFL  UE Strength: Right WFL, Left WFL    Lower Extremities  LE ROM: Right WFL, Left WFL  LE Strength: Right WFL, Left WFL          Sensation: WFL    Static Sitting-Level of Assistance: Independent  Dynamic Sitting-Level of Assistance: Supervision    Static Standing-Level of Assistance: Supervision  Dynamic Standing - Level of Assistance: Stand by assistance  Standing Balance comments: w RW      Bed Mobility: Supine to Sit  Supine to Sit assistance level: Minimal assist, patient does 75% or more  Bed Mobility comments: Supine <> sit minA for trunk support, verbal instructions for hand placement, body positioning/scooting in bed     Transfers: Sit to Stand  Sit to Stand assistance level: Minimal assist, patient does 75% or more  Transfer comments: minA w RW to complete x2 stands from EOB, slow with transitions      Gait Level of Assistance: Standby assist, set-up cues, supervision of patient - no hands on  Gait Assistive Device: Rolling walker  Gait Distance Ambulated (ft): 30 ft  Skilled Treatment Performed: 2x30 ft., rest break in-between. CGA for safety, no LOB. slow gait speed, decreased step length/height, fwd trunk     Stairs: NT  Endurance: fair, rest break as needed    Patient at end of session: All needs in reach, Friends/Family present, In bed, Lines intact, Notified Nurse    Physical Therapy Session Duration  PT Individual [mins]: 44          AM-PAC 5 click  Help currently need turning over In bed?: A Little - Minimal/Contact Guard Assist/Supervision  Help currently needed sitting down/standing up from chair with arms? : A Little - Minimal/Contact Guard Assist/Supervision  Help currently needed moving from supine to sitting on edge of bed?: A Little - Minimal/Contact Guard Assist/Supervision  Help currently needed moving to and from bed from wheelchair?: A Little - Minimal/Contact Guard Assist/Supervision  Help currently needed walking in a hospital room?: A Little - Minimal/Contact Guard Assist/Supervision      Basic Mobility Score 5 click: 15    Score (in points): % of Functional Impairment, Limitation, Restriction  5: 100% impaired, limited, restricted  6-7: At least 80%, but less than 100% impaired, limited restricted  8-11: At least 60%, but less than 80% impaired, limited restricted  12-16: At least 40%, but less than 60% impaired, limited restricted  17-18: At least 20%, but less than 40% impaired, limited restricted  19: At least 1%, but less than 20% impaired, limited restricted  20: 0% impaired, limited restricted    'AM-PAC' forms are Copyright protected by [employer] [facility]         I attest that I have reviewed the above information.  Signed: Leodis JAYSON Breed, PT  Filed 03/01/2024 [1]   Past Medical History:  Diagnosis Date    Alcoholism    (CMS-HCC)     Alcoholism /alcohol abuse     Cirrhosis    (CMS-HCC)     Depression     Hypertension     Liver disease     Malignant neoplasm of overlapping sites of right breast in female, estrogen receptor positive    (CMS-HCC) 10/03/2020   [2]   Past Surgical History:  Procedure Laterality Date    BREAST BIOPSY Right     benign-a long time ago    BREAST BIOPSY Right 07/2020    malignant    BREAST LUMPECTOMY Right     4 2022    CHOLECYSTECTOMY      PR BX/REMV,LYMPH NODE,DEEP AXILL Right 09/03/2020    Procedure: BX/EXC LYMPH NODE; OPEN, DEEP AXILRY NODE;  Surgeon: Alm Elsie Como, MD;  Location: [facility] OR [facility];  Service: Surgical Oncology Breast    PR INTRAOPERATIVE SENTINEL LYMPH NODE ID W DYE INJECTION Right 09/03/2020    Procedure: INTRAOPERATIVE IDENTIFICATION SENTINEL LYMPH NODE(S) INCLUDE INJECTION NON-RADIOACTIVE DYE, WHEN PERFORMED;  Surgeon: Alm Elsie Como, MD;  Location: [facility] OR [facility];  Service: Surgical Oncology Breast    PR MASTECTOMY, PARTIAL Right 09/03/2020    Procedure: MASTECTOMY, PARTIAL (EG, LUMPECTOMY, TYLECTOMY, QUADRANTECTOMY, SEGMENTECTOMY);  Surgeon: Alm Elsie Como, MD;  Location: [facility] OR [facility];  Service: Surgical Oncology Breast    PR UPPER GI ENDOSCOPY,BIOPSY N/A 02/03/2018    Procedure: UGI ENDOSCOPY; WITH BIOPSY, SINGLE OR MULTIPLE;  Surgeon: Eleanor Dewey Sorrel, MD;  [facility]: [facility];  Service: Gastroenterology    RADIATION Right     unsure finish date    TUBAL LIGATION     [3]   Family History  Problem Relation Age of Onset    Heart disease Mother     No Known Problems Father     No Known Problems Sister  No Known Problems Daughter     No Known Problems Maternal Grandmother     No Known Problems Maternal Grandfather     No Known Problems Paternal Grandmother     No Known Problems Paternal Grandfather     Lupus Brother     No Known Problems Other     BRCA 1/2 Neg Hx     Breast cancer Neg Hx Cancer Neg Hx     Colon cancer Neg Hx     Endometrial cancer Neg Hx     Ovarian cancer Neg Hx     Mental illness Neg Hx     Substance Abuse Disorder Neg Hx

## 2024-03-01 NOTE — Unmapped (Signed)
 Problem: Adult Inpatient Plan of Care  Goal: Absence of Hospital-Acquired Illness or Injury  Outcome: Progressing  Intervention: Identify and Manage Fall Risk  Recent Flowsheet Documentation  Taken 03/01/2024 0800 by Aleisha Paone, RN  Safety Interventions:   fall reduction program maintained   low bed  Intervention: Prevent Skin Injury  Recent Flowsheet Documentation  Taken 03/01/2024 0800 by Byron Peacock, RN  Skin Protection: adhesive use limited  Goal: Optimal Comfort and Wellbeing  Outcome: Progressing  Goal: Readiness for Transition of Care  Outcome: Progressing  Goal: Rounds/Family Conference  Outcome: Progressing     Problem: Fall Injury Risk  Goal: Absence of Fall and Fall-Related Injury  Outcome: Progressing  Intervention: Promote Injury-Free Environment  Recent Flowsheet Documentation  Taken 03/01/2024 0800 by Tovah Slavick, RN  Safety Interventions:   fall reduction program maintained   low bed

## 2024-03-01 NOTE — Unmapped (Signed)
 Internal Medicine (MEDM) Progress Note    Assessment & Plan:   [name] is a 78 y.o. female who is presenting to [facility] with Acute heart failure with preserved ejection fraction    (CMS-HCC), in the setting of the following pertinent/contributing co-morbidities: cirrhosis, hypertension breast cancer s/p partial mastectomy.    Principal Problem:    Acute heart failure with preserved ejection fraction    (CMS-HCC)  Active Problems:    HTN (hypertension)    Class 2 severe obesity with serious comorbidity in adult (CMS-HCC)    Malignant neoplasm of overlapping sites of right breast in female, estrogen receptor positive    (CMS-HCC)    Atrial fibrillation    (CMS-HCC)    Pleural effusion    Localized edema    Dyspnea    Vertebral compression fracture    (CMS-HCC)    Hypernatremia    Thrombocytopenia    Cirrhosis    (CMS-HCC)    T12 compression fracture, initial encounter    (CMS-HCC)        Active Problems    New HFpEF  Presenting with several weeks or worsening lower extremity edema, orthopnea, and dyspnea. Imaging with signs of pulmonary congestion and bilateral pleural effusions (R>L). PRO-BNP elevated to 846. History of HTN that has been treated with coreg  and amlodipine . No known CAD history though significant family history of such (MI in Mom, sister, and brother). TTE 9/30 with preserved EF, Iikely related to chronic HTN. TSH, lipid panel and A1c unrevealing. Will titrate GDMT while inpatient and diurese to euvolemia.   - diuresis:   - goal NN 500-1L   - strict I/O, daily weight   - s/p Lasix 20mg  with inadequate output   - give 40mg  IV Lasix, re-dose to meet NN goal  - GDMT: will titrate 10/1 pending renal function and electrolytes given new HFpEF diagnosis  - Continue home coreg  3.125 mg BID  - Continue home amlodipine  10 mg daily, titrate to spironolactone pending BMP 10/1     New Onset Atrial Fibrillation  New onset atrial fibrillation that is rate controlled, on chronic coreg . CHA2DS2VASC 4 (age +35. Female, Hx of HTN). Will continue home coreg  and monitor on telemetry. Working on diuresis and then will engage Cardiology regarding need for eventual cardioversion, likely referral to afib clinic. Discuss DOAC with family given high deductible insurance.   - Telemetry  - Continue home coreg  3.125 mg BID  - DOAC is financially feasible per daughter, can start on 10/1     Vertebral Compression Fracture  T12 vertebral compression fracture with retropulsion into the ventral spinal canal on CT. She denies back pain or known trauma aside from a fall down 6 stairs 6 months ago. No tenderness on palpation. Has known history of osteoporosis identified on Dexa scan of femoral neck (11/2022 T score -2.9). Currently on vitamin D, calcium, and alendronate .   - Continue home calcium 600 mg daily  - has not started home alendronate , can start at discharge     Lower Extremity Blister  Signs of burst blister on LLE with erythema. Not significantly more warm than surrounding area, so lower concern for cellulitis. X-ray without signs of osteomyelitis. Will continue to monitor.     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 Previously followed with hepatology but lost to follow up in 2020. Formerly prescribed lactulose  but has not been on it in several years. Platelets 132 on admission though INR 1.13. Some  issues with memory of recent events but low concern for HE at this time.   - CBC, HFP daily     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)  -Continue anastrozole  1 mg daily    The patient's presentation is complicated by the following clinically significant conditions requiring additional evaluation and treatment: - Disorders of electrolytes, volume status, and acid/base status: - Volume overload requiring further investigation, treatment, or monitoring  - Thrombocytopenia requiring further investigation or monitor  - overweight/obese/morbidly obese: obese POA affecting nursing needs, DME, and attention to weight based medication dosing -- Body mass index is 36.8 kg/m??.   - Age related debility POA requiring additional resources: DME, PT, or OT    Issues Impacting Complexity of Management:  -The patient is at high risk from Hospital immobility in an elderly patient given baseline poor functional status with a high risk of causing delirium and further decline in function  -The patient is at high risk for the development of complications of volume overload due to the need to provide IV hydration for suspected hypovolemia in the setting of: heart failure and Cirrhosis  -The patient is at high risk of complications from heart failure      Medical Decision Making: Assessment required an independent historian, additional information obtained from family/friend, [name], due to patient's uncertainty of home medications.      Daily Checklist:  Diet: Regular Diet  DVT PPx: Lovenox 40mg  q24h, plan to start DOAC 10/1  Electrolytes: Replete Potassium to >/=4 and Magnesium  to >/=2  Code Status: Full Code  Dispo: Goal Discharge: 10/2    Team Contact Information:   Primary Team: Internal Medicine (MEDM)  Primary Resident: Almarie JONELLE Coin, MD, MD  Resident's Pager: [phone] [facility] Senior Resident)    Interval History:   Admitted overnight and feeling much better this morning, less dyspneic. Not much UOP with oral lasix. Had difficulty getting up to use the bathroom.     Objective:   Temp:  [35.7 ??C (96.3 ??F)-35.9 ??C (96.7 ??F)] 35.7 ??C (96.3 ??F)  Pulse:  [75-85] 75  SpO2 Pulse:  [74-90] 83  Resp:  [16-27] 16  BP: (130-153)/(62-86) 130/80  SpO2:  [90 %-96 %] 96 %    GEN: Pleasant appearing, no distress, lying comfortably in bed  EYES: Sclera anicteric  CV: irregular rhythm. No murmur appreciated. Extremities warm but edematous.  PULM: Clear to auscultation bilaterally in anterior fields, decreased aeration at bilateral anterior bases  ABD: Soft, non-tender, mildly distended. Active bowel sounds.   EXT: 3+ edema at the feet, 1-2+ to the upper shin. Warm.   NEURO/PSYCH: Alert, oriented. Asked and answered questions appropriately.     CHARLENA Eliberto Coin, MD MPH  PGY-2 Internal Medicine  [facility]

## 2024-03-02 LAB — BASIC METABOLIC PANEL
ANION GAP: 10 mmol/L (ref 5–14)
ANION GAP: 12 mmol/L (ref 5–14)
BLOOD UREA NITROGEN: 8 mg/dL — ABNORMAL LOW (ref 9–23)
BLOOD UREA NITROGEN: 6 mg/dL — ABNORMAL LOW (ref 9–23)
BUN / CREAT RATIO: 16
BUN / CREAT RATIO: 13
CALCIUM: 8.9 mg/dL (ref 8.7–10.4)
CALCIUM: 9.3 mg/dL (ref 8.7–10.4)
CHLORIDE: 100 mmol/L (ref 98–107)
CHLORIDE: 100 mmol/L (ref 98–107)
CO2: 35.9 mmol/L — ABNORMAL HIGH (ref 20.0–31.0)
CO2: 34.3 mmol/L — ABNORMAL HIGH (ref 20.0–31.0)
CREATININE: 0.49 mg/dL — ABNORMAL LOW (ref 0.55–1.02)
CREATININE: 0.47 mg/dL — ABNORMAL LOW (ref 0.55–1.02)
EGFR CKD-EPI (2021) FEMALE: >90 mL/min/1.73m2 (ref >=60)
EGFR CKD-EPI (2021) FEMALE: >90 mL/min/1.73m2 (ref >=60)
GLUCOSE RANDOM: 124 mg/dL (ref 70–179)
GLUCOSE RANDOM: 91 mg/dL (ref 70–179)
POTASSIUM: 4.1 mmol/L (ref 3.4–4.8)
POTASSIUM: 4.5 mmol/L (ref 3.4–4.8)
SODIUM: 146 mmol/L — ABNORMAL HIGH (ref 135–145)
SODIUM: 146 mmol/L — ABNORMAL HIGH (ref 135–145)

## 2024-03-02 LAB — CBC
HEMATOCRIT: 39.2 % (ref 34.0–44.0)
HEMOGLOBIN: 13.3 g/dL (ref 11.3–14.9)
MEAN CORPUSCULAR HEMOGLOBIN CONC: 33.8 g/dL (ref 32.0–36.0)
MEAN CORPUSCULAR HEMOGLOBIN: 33.1 pg — ABNORMAL HIGH (ref 25.9–32.4)
MEAN CORPUSCULAR VOLUME: 97.9 fL — ABNORMAL HIGH (ref 77.6–95.7)
MEAN PLATELET VOLUME: 8 fL (ref 6.8–10.7)
PLATELET COUNT: 104 10*9/L — ABNORMAL LOW (ref 150–450)
RED BLOOD CELL COUNT: 4 10*12/L (ref 3.95–5.13)
RED CELL DISTRIBUTION WIDTH: 15.8 % — ABNORMAL HIGH (ref 12.2–15.2)
WBC ADJUSTED: 4.1 10*9/L (ref 3.6–11.2)

## 2024-03-02 LAB — CYSTATIN C
CYSTATIN C: 1.36 mg/L — ABNORMAL HIGH (ref 0.64–1.23)
EGFR CKD-EPI (2012) CYSTATIN C FEMALE: 45 mL/min/1.73m2 — ABNORMAL LOW (ref >=60)

## 2024-03-02 LAB — MAGNESIUM
MAGNESIUM: 2.2 mg/dL (ref 1.6–2.6)
MAGNESIUM: 2.2 mg/dL (ref 1.6–2.6)

## 2024-03-02 MED ORDER — DAPAGLIFLOZIN PROPANEDIOL 10 MG TABLET
ORAL_TABLET | Freq: Every morning | ORAL | 2 refills | 30.00000 days | Status: CP
Start: 2024-03-02 — End: 2024-03-02

## 2024-03-02 MED ORDER — EMPAGLIFLOZIN 10 MG TABLET
ORAL_TABLET | Freq: Every day | ORAL | 2 refills | 30.00000 days | Status: CP
Start: 2024-03-02 — End: 2024-03-02

## 2024-03-02 MED ADMIN — potassium chloride 10 mEq in 100 mL IVPB: 10 meq | INTRAVENOUS | @ 01:00:00 | Stop: 2024-03-01

## 2024-03-02 MED ADMIN — anastrozole (ARIMIDEX) tablet 1 mg: 1 mg | ORAL | @ 12:00:00

## 2024-03-02 MED ADMIN — enoxaparin (LOVENOX) syringe 40 mg: 40 mg | SUBCUTANEOUS | @ 01:00:00

## 2024-03-02 MED ADMIN — magnesium oxide (MAG-OX) tablet 400 mg: 400 mg | ORAL | @ 12:00:00

## 2024-03-02 MED ADMIN — bacitracin ointment: TOPICAL | @ 12:00:00

## 2024-03-02 MED ADMIN — bacitracin ointment: TOPICAL | @ 01:00:00

## 2024-03-02 MED ADMIN — magnesium sulfate in D5W 1 gram/100 mL infusion 1 g: 1 g | INTRAVENOUS | @ 01:00:00 | Stop: 2024-03-01

## 2024-03-02 MED ADMIN — carvedilol (COREG) tablet 3.125 mg: 3.125 mg | ORAL | @ 12:00:00

## 2024-03-02 MED ADMIN — spironolactone (ALDACTONE) split tablet 12.5 mg: 12.5 mg | ORAL | @ 14:00:00

## 2024-03-02 MED ADMIN — calcium carbonate tablet 600 mg elem calcium: 600 mg | ORAL | @ 12:00:00

## 2024-03-02 MED ADMIN — carvedilol (COREG) tablet 3.125 mg: 3.125 mg | ORAL | @ 01:00:00

## 2024-03-02 MED ADMIN — potassium chloride ER tablet 40 mEq: 40 meq | ORAL | @ 02:00:00 | Stop: 2024-03-01

## 2024-03-02 MED ADMIN — melatonin tablet 3 mg: 3 mg | ORAL | @ 01:00:00

## 2024-03-02 MED ADMIN — magnesium oxide (MAG-OX) tablet 400 mg: 400 mg | ORAL | @ 01:00:00

## 2024-03-02 MED ADMIN — furosemide (LASIX) injection 40 mg: 40 mg | INTRAVENOUS | @ 14:00:00

## 2024-03-02 NOTE — Unmapped (Signed)
 Pt is A/O x 4, is able to make needs known, POC discussed with pt, pt tolerated therapies this shift, assisted with needs, safety precautions maintained and pt encouraged to call for assistance with needs, call bell is within reach, will continue to monitor.   Problem: Adult Inpatient Plan of Care  Goal: Absence of Hospital-Acquired Illness or Injury  03/02/2024 0514 by Roselee Furry, RN  Outcome: Ongoing - Unchanged  03/01/2024 2238 by Roselee Furry, RN  Outcome: Shift Focus  Intervention: Identify and Manage Fall Risk  Recent Flowsheet Documentation  Taken 03/02/2024 0400 by Roselee Furry, RN  Safety Interventions:   bed alarm   fall reduction program maintained   lighting adjusted for tasks/safety   low bed  Taken 03/02/2024 0200 by Roselee Furry, RN  Safety Interventions:   bed alarm   fall reduction program maintained   lighting adjusted for tasks/safety   low bed  Taken 03/02/2024 0000 by Roselee Furry, RN  Safety Interventions:   fall reduction program maintained   lighting adjusted for tasks/safety   low bed  Taken 03/01/2024 2200 by Roselee Furry, RN  Safety Interventions:   fall reduction program maintained   lighting adjusted for tasks/safety   low bed  Taken 03/01/2024 2000 by Roselee Furry, RN  Safety Interventions:   bed alarm   fall reduction program maintained   lighting adjusted for tasks/safety   low bed  Goal: Optimal Comfort and Wellbeing  Outcome: Ongoing - Unchanged  Goal: Readiness for Transition of Care  Outcome: Ongoing - Unchanged  Goal: Rounds/Family Conference  Outcome: Ongoing - Unchanged     Problem: Fall Injury Risk  Goal: Absence of Fall and Fall-Related Injury  03/02/2024 0514 by Roselee Furry, RN  Outcome: Ongoing - Unchanged  03/01/2024 2238 by Roselee Furry, RN  Outcome: Shift Focus  Intervention: Promote Injury-Free Environment  Recent Flowsheet Documentation  Taken 03/02/2024 0400 by Roselee Furry, RN  Safety Interventions:   bed alarm   fall reduction program maintained   lighting adjusted for tasks/safety   low bed  Taken 03/02/2024 0200 by Roselee Furry, RN  Safety Interventions:   bed alarm   fall reduction program maintained   lighting adjusted for tasks/safety   low bed  Taken 03/02/2024 0000 by Roselee Furry, RN  Safety Interventions:   fall reduction program maintained   lighting adjusted for tasks/safety   low bed  Taken 03/01/2024 2200 by Roselee Furry, RN  Safety Interventions:   fall reduction program maintained   lighting adjusted for tasks/safety   low bed  Taken 03/01/2024 2000 by Roselee Furry, RN  Safety Interventions:   bed alarm   fall reduction program maintained   lighting adjusted for tasks/safety   low bed     Problem: Self-Care Deficit  Goal: Improved Ability to Complete Activities of Daily Living  03/02/2024 0514 by Roselee Furry, RN  Outcome: Ongoing - Unchanged  03/01/2024 2238 by Roselee Furry, RN  Outcome: Shift Focus     Problem: Dysrhythmia  Goal: Normalized Cardiac Rhythm  03/02/2024 0514 by Roselee Furry, RN  Outcome: Ongoing - Unchanged  03/01/2024 2238 by Roselee Furry, RN  Outcome: Shift Focus

## 2024-03-02 NOTE — Unmapped (Signed)
 Care Management  Initial Transition Planning Assessment    Patient resides in home with spouse and child.  Independent at baseline with rolling walker and rollator.     Type of Residence: Mailing Address:  [address]  [place] [address]  Contacts: Accompanied by: Family member  Patient Phone Number: [phone] (home) [phone] (work)        Medical Provider(s): Alyse Slater Pao, MD  Reason for Admission: Admitting Diagnosis:  Weakness [R53.1]  Past Medical History:   has a past medical history of Alcoholism    (CMS-HCC), Alcoholism /alcohol abuse, Cirrhosis    (CMS-HCC), Depression, Hypertension, Liver disease, and Malignant neoplasm of overlapping sites of right breast in female, estrogen receptor positive    (CMS-HCC) (10/03/2020).  Past Surgical History:   has a past surgical history that includes Breast biopsy (Right); Cholecystectomy; pr upper gi endoscopy,biopsy (N/A, 02/03/2018); Tubal ligation; pr mastectomy, partial (Right, 09/03/2020); pr bx/remv,lymph node,deep axill (Right, 09/03/2020); pr intraoperative sentinel lymph node id w dye injection (Right, 09/03/2020); Breast lumpectomy (Right); Radiation (Right); and Breast biopsy (Right, 07/2020).   Previous admit date: 06/03/2017    Primary Insurance- Payor: Advertising copywriter MEDICARE ADV / Plan: Advertising copywriter MEDICARE ADV / Product Type: *No Product type* /   Secondary Insurance - None  Prescription Coverage -   Preferred Pharmacy - [facility] [phone] - [place] - [address] [place] [address] [place] [address]  [facility]    Transportation home: Occupational hygienist / Social Worker assessed the patient by : In person interview with patient  Orientation Level: Oriented X4  Functional level prior to admission: Independent  Reason for referral: Discharge Planning    Contact/Decision Maker  Extended Emergency Contact Information  Primary Emergency Contact: Pommier,[name]  Address: [address]           [place], [place] [address] [place]  Home Phone: [phone]  Mobile Phone: [phone]  Relation: Spouse  Secondary Emergency Contact: Dottavio,[name]  Address: [address]           [place], [place] [address] [place]  Home Phone: [phone]  Mobile Phone: [phone]  Relation: Daughter    Legal Next of Kin / Guardian / POA / Advance Directives     HCDM (patient stated preference): Holton,[name] - Spouse - [phone]    HCDM, First Alternate: Dimascio,[name] - Daughter - [phone]    Advance Directive (Medical Treatment)  Does patient have an advance directive covering medical treatment?: Patient has advance directive covering medical treatment, copy in chart.    Health Care Decision Maker [HCDM] (Medical & Mental Health Treatment)  Healthcare Decision Maker: HCDM documented in the HCDM/Contact Info section.  Information offered on HCDM, Medical & Mental Health advance directives:: Other (Comment) (Daughters: Sari Barrio & [name] Kerns shared, but first call to [facility])         Readmission Information    Have you been hospitalized in the last 30 days?: No                                Did the following happen with your discharge?  Patient Information  Lives with: Spouse/significant other, Children    Type of Residence: Private residence             Support Systems/Concerns: Children    Responsibilities/Dependents at home?: No    Home Care services in place prior to admission?: No          Outpatient/Community Resources in place prior to admission: Clinic       Equipment Currently Used at Home: walker, rolling       Currently receiving outpatient dialysis?: No       Financial Information       Need for financial assistance?: No       Social Determinants of Health  Social Determinants of Health were addressed in provider documentation.  Please refer to patient history.  Social Drivers of Health Food Insecurity: No Food Insecurity (03/02/2024)    Hunger Vital Sign     Worried About Running Out of Food in the Last Year: Never true     Ran Out of Food in the Last Year: Never true   Tobacco Use: Low Risk  (03/01/2024)    Patient History     Smoking Tobacco Use: Never     Smokeless Tobacco Use: Never     Passive Exposure: Past   Transportation Needs: No Transportation Needs (03/02/2024)    PRAPARE - Transportation     Lack of Transportation (Medical): No     Lack of Transportation (Non-Medical): No   Alcohol Use: Not At Risk (04/20/2023)    Alcohol Use     How often do you have a drink containing alcohol?: Never     How many drinks containing alcohol do you have on a typical day when you are drinking?: 1 - 2     How often do you have 5 or more drinks on one occasion?: Never   Housing: Low Risk  (03/02/2024)    Housing     Within the past 12 months, have you ever stayed: outside, in a car, in a tent, in an overnight shelter, or temporarily in someone else's home (i.e. couch-surfing)?: No     Are you worried about losing your housing?: No   Physical Activity: Inactive (04/20/2023)    Exercise Vital Sign     Days of Exercise per Week: 0 days     Minutes of Exercise per Session: 0 min   Utilities: Low Risk  (04/20/2023)    Utilities     Within the past 12 months, have you been unable to get utilities (heat, electricity) when it was really needed?: No   Stress: No Stress Concern Present (04/20/2023)    [employer] of Occupational Health - Occupational Stress Questionnaire     Feeling of Stress : Only a little   Interpersonal Safety: Not At Risk (02/29/2024)    Interpersonal Safety     Unsafe Where You Currently Live: No     Physically Hurt by Anyone: No     Abused by Anyone: No   Substance Use: Low Risk  (04/20/2023)    Substance Use     In the past year, how often have you used prescription drugs for non-medical reasons?: Never     In the past year, how often have you used illegal drugs?: Never     In the past year, have you used any substance for non-medical reasons?: No   Intimate Partner Violence: Not At Risk (04/20/2023)    Humiliation, Afraid, Rape, and Kick questionnaire  Fear of Current or Ex-Partner: No     Emotionally Abused: No     Physically Abused: No     Sexually Abused: No   Social Connections: Moderately Isolated (04/20/2023)    Social Connection and Isolation Panel     Frequency of Communication with Friends and Family: More than three times a week     Frequency of Social Gatherings with Friends and Family: More than three times a week     Attends Religious Services: Never     Database administrator or Organizations: No     Attends Engineer, structural: Never     Marital Status: Married   Programmer, applications: Low Risk  (03/02/2024)    Overall Financial Resource Strain (CARDIA)     Difficulty of Paying Living Expenses: Not hard at all   Health Literacy: Low Risk  (04/20/2023)    Health Literacy     : Never   Internet Connectivity: No Internet connectivity concern identified (04/20/2023)    Internet Connectivity     Do you have access to internet services: Yes     How do you connect to the internet: Personal Device at home     Is your internet connection strong enough for you to watch video on your device without major problems?: Yes     Do you have enough data to get through the month?: Yes     Does at least one of the devices have a camera that you can use for video chat?: Yes       Complex Discharge Information    Is patient identified as a difficult/complex discharge?: No                                                               Interventions:       Discharge Needs Assessment  Concerns to be Addressed: discharge planning    Clinical Risk Factors: > 65    Barriers to taking medications: No    Prior overnight hospital stay or ED visit in last 90 days: No              Anticipated Changes Related to Illness: none    Equipment Needed After Discharge: none    Discharge Facility/Level of Care Needs: other (see comments)    Readmission  Risk of Unplanned Readmission Score: UNPLANNED READMISSION SCORE: 12.65%  Predictive Model Details          13% (Medium)  Factor Value    Calculated 03/02/2024 12:09 20% Number of active inpatient medication orders 21    [facility] Risk of Unplanned Readmission Model 14% Diagnosis of cancer present     13% ECG/EKG order present in last 6 months     10% Diagnosis of electrolyte disorder present     9% Charlson Comorbidity Index 6     9% Imaging order present in last 6 months     8% Age 78     8% Number of ED visits in last six months 1     6% Active anticoagulant inpatient medication order present     3% Future appointment scheduled     1% Current length of stay 0.972 days      Readmitted Within the Last 30 Days? (No if blank)  Patient at risk for readmission?: No    Discharge Plan  Screen findings are: Discharge planning needs identified or anticipated (Comment).    Expected Discharge Date: 03/03/2024    Expected Transfer from Critical Care:      Quality data for continuing care services shared with patient and/or representative?: Yes  Patient and/or family were provided with choice of facilities / services that are available and appropriate to meet post hospital care needs?: Yes   List choices in order highest to lowest preferred, if applicable. : TBD    Initial Assessment complete?: Yes

## 2024-03-02 NOTE — Unmapped (Signed)
 Patient alert and orientated x 4, however she is forgetful. Family visited today.   Problem: Adult Inpatient Plan of Care  Goal: Absence of Hospital-Acquired Illness or Injury  Outcome: Ongoing - Unchanged  Goal: Optimal Comfort and Wellbeing  Outcome: Ongoing - Unchanged  Goal: Readiness for Transition of Care  Outcome: Ongoing - Unchanged  Goal: Rounds/Family Conference  Outcome: Ongoing - Unchanged     Problem: Fall Injury Risk  Goal: Absence of Fall and Fall-Related Injury  Outcome: Ongoing - Unchanged  Intervention: Identify and Manage Contributors  Flowsheets (Taken 03/02/2024 0742)  Medication Review/Management: medications reviewed  Self-Care Promotion:   independence encouraged   BADL personal objects within reach     Problem: Self-Care Deficit  Goal: Improved Ability to Complete Activities of Daily Living  Outcome: Ongoing - Unchanged  Intervention: Promote Activity and Functional Independence  Recent Flowsheet Documentation  Taken 03/02/2024 0742 by Cleatus Fonda CROME, RN  Self-Care Promotion:   independence encouraged   BADL personal objects within reach     Problem: Dysrhythmia  Goal: Normalized Cardiac Rhythm  Outcome: Ongoing - Unchanged

## 2024-03-02 NOTE — Unmapped (Signed)
 Internal Medicine (MEDM) Progress Note    Assessment & Plan:   [name] is a 78 y.o. female who is presenting to [facility] with Acute heart failure with preserved ejection fraction    (CMS-HCC), in the setting of the following pertinent/contributing co-morbidities: cirrhosis, hypertension breast cancer s/p partial mastectomy.    Principal Problem:    Acute heart failure with preserved ejection fraction    (CMS-HCC)  Active Problems:    HTN (hypertension)    Class 2 severe obesity with serious comorbidity in adult (CMS-HCC)    Malignant neoplasm of overlapping sites of right breast in female, estrogen receptor positive    (CMS-HCC)    Atrial fibrillation    (CMS-HCC)    Pleural effusion    Localized edema    Dyspnea    Vertebral compression fracture    (CMS-HCC)    Hypernatremia    Thrombocytopenia    Cirrhosis    (CMS-HCC)    T12 compression fracture, initial encounter    (CMS-HCC)    Active Problems    New HFpEF - Chronic HTN  Presented with several weeks or worsening lower extremity edema, orthopnea, and dyspnea. Chest imaging with signs of pulmonary congestion and bilateral pleural effusions (R>L). PRO-BNP elevated to 846. History of HTN that has been treated with coreg  and amlodipine . No known CAD.SABRA TTE 9/30 with preserved EF, Iikely related to chronic HTN although no evidence of diastolic dysfunction. TSH, lipid panel and A1c unrevealing. Excellent UOP response to 40mg  IV Lasix. LE edema markedly improved but still dyspneic 10/1, will continue diuresing to net negative goal as below. Will titrate GDMT while inpatient and diurese to euvolemia.   - diuresis:   - Goal NN 500-1L until symptoms improve, then maintain euvolemia   - strict I/O, daily weight   - s/p Lasix 20mg  with inadequate output   - give 40mg  IV Lasix 10/1  - GDMT   - start spironolactone 12.5mg  10/1   - SGLT2i test script sent  - Continue home coreg  3.125 mg BID  - discontinue amlodipine  at discharge, plan to titrate spironolactone for HTN      New Onset Atrial Fibrillation  New onset atrial fibrillation that is rate controlled on chronic carvedilol . CHA2DS2VASC 4 (age +71. Female, Hx of HTN). Will continue home carvedilol  and monitor on telemetry. Working on diuresis and then will engage Cardiology regarding need for eventual cardioversion, likely referral to afib clinic. Starting DOAC 10/1.   - Telemetry  - Continue home coreg  3.125 mg BID  - start apixaban 5mg  BID 10/1     Vertebral Compression Fracture  T12 vertebral compression fracture with retropulsion into the ventral spinal canal on CT. She denies back pain or known trauma aside from a fall down 6 stairs 6 months ago. No tenderness on palpation. Has known history of osteoporosis identified on Dexa scan of femoral neck (11/2022 T score -2.9). Currently on vitamin D, calcium, and alendronate .   - Continue home calcium 600 mg daily  - has not started home alendronate  in the outpatient setting, can start at discharge     Lower Extremity Blister  Signs of burst blister on LLE with erythema. Not significantly more warm than surrounding area, so lower concern for cellulitis. X-ray without signs of osteomyelitis. Will continue to monitor.     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 Previously followed with hepatology but lost to follow up in 2020. Formerly prescribed lactulose  but has not  been on it in several years. Platelets 132 on admission though INR 1.13. Some issues with memory of recent events but low concern for HE at this time.   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   - Age related debility POA requiring additional resources: DME, PT, or OT    Issues Impacting Complexity of Management:  -The patient is at high risk from Hospital immobility in an elderly patient given baseline poor functional status with a high risk of causing delirium and further decline in function  -The patient is at high risk for the development of complications of volume overload due to the need to provide IV hydration for suspected hypovolemia in the setting of: heart failure and Cirrhosis  -The patient is at high risk of complications from heart failure    Medical Decision Making: Assessment required an independent historian, additional information obtained from family/friend, [name], due to patient's uncertainty of home medications.    Daily Checklist:  Diet: Regular Diet  DVT PPx: starting full dose AC with apixaban 10/1  Electrolytes: Replete Potassium to >/=4 and Magnesium  to >/=2  Code Status: Full Code  Dispo: Goal Discharge: 10/2    Team Contact Information:   Primary Team: Internal Medicine (MEDM)  Primary Resident: Almarie JONELLE Coin, MD  Resident's Pager: [phone] [facility])    Interval History:     No acute events overnight. Feels about the same today as yesterday, predominant symptoms are shortness of breath. Her LE is much improved and she appreciates this. In good spirits, hopeful to go home but on board with staying until medically clear.     Objective:   Temp:  [36 ??C (96.8 ??F)] 36 ??C (96.8 ??F)  Pulse:  [63-81] 74  Resp:  [16-18] 17  BP: (103-122)/(73-87) 122/83  SpO2:  [94 %-97 %] 94 %    GEN: Pleasant appearing, no distress, lying comfortably in bed  EYES: Sclera anicteric  CV: irregular rhythm. No murmur appreciated. Cardiac wheeze present.   PULM: Normal work of breathing on room air. Breath sounds clear bilaterally, remains with dec breath sounds in lateral bases.  ABD: Soft, non-tender, mildly distended and stable from prior exam. Active bowel sounds.   EXT: Warm. Markedly improved edema with trace to 1+ pitting edema on RLE, 1+ LLE  NEURO/PSYCH: Alert, oriented. Asked and answered questions appropriately.     CHARLENA Eliberto Coin, MD MPH  PGY-2 Internal Medicine  [facility]

## 2024-03-03 LAB — CBC
HEMATOCRIT: 41 % (ref 34.0–44.0)
HEMOGLOBIN: 14 g/dL (ref 11.3–14.9)
MEAN CORPUSCULAR HEMOGLOBIN CONC: 34.3 g/dL (ref 32.0–36.0)
MEAN CORPUSCULAR HEMOGLOBIN: 33.3 pg — ABNORMAL HIGH (ref 25.9–32.4)
MEAN CORPUSCULAR VOLUME: 97.1 fL — ABNORMAL HIGH (ref 77.6–95.7)
MEAN PLATELET VOLUME: 8.1 fL (ref 6.8–10.7)
PLATELET COUNT: 104 10*9/L — ABNORMAL LOW (ref 150–450)
RED BLOOD CELL COUNT: 4.22 10*12/L (ref 3.95–5.13)
RED CELL DISTRIBUTION WIDTH: 15.4 % — ABNORMAL HIGH (ref 12.2–15.2)
WBC ADJUSTED: 4.4 10*9/L (ref 3.6–11.2)

## 2024-03-03 LAB — BASIC METABOLIC PANEL
ANION GAP: 12 mmol/L (ref 5–14)
BLOOD UREA NITROGEN: 6 mg/dL — ABNORMAL LOW (ref 9–23)
BUN / CREAT RATIO: 14
CALCIUM: 9.1 mg/dL (ref 8.7–10.4)
CHLORIDE: 97 mmol/L — ABNORMAL LOW (ref 98–107)
CO2: 36 mmol/L — ABNORMAL HIGH (ref 20.0–31.0)
CREATININE: 0.44 mg/dL — ABNORMAL LOW (ref 0.55–1.02)
EGFR CKD-EPI (2021) FEMALE: >90 mL/min/1.73m2 (ref >=60)
GLUCOSE RANDOM: 108 mg/dL (ref 70–179)
POTASSIUM: 3.6 mmol/L (ref 3.4–4.8)
SODIUM: 145 mmol/L (ref 135–145)

## 2024-03-03 LAB — MAGNESIUM: MAGNESIUM: 2 mg/dL (ref 1.6–2.6)

## 2024-03-03 MED ADMIN — bisacodyl (DULCOLAX) EC tablet 10 mg: 10 mg | ORAL | @ 21:00:00 | Stop: 2024-03-03

## 2024-03-03 MED ADMIN — magnesium oxide (MAG-OX) tablet 400 mg: 400 mg | ORAL | @ 01:00:00

## 2024-03-03 MED ADMIN — bacitracin ointment: TOPICAL | @ 13:00:00

## 2024-03-03 MED ADMIN — apixaban (ELIQUIS) tablet 5 mg: 5 mg | ORAL | @ 01:00:00

## 2024-03-03 MED ADMIN — calcium carbonate tablet 600 mg elem calcium: 600 mg | ORAL | @ 13:00:00

## 2024-03-03 MED ADMIN — bacitracin ointment: TOPICAL | @ 01:00:00

## 2024-03-03 MED ADMIN — magnesium oxide (MAG-OX) tablet 400 mg: 400 mg | ORAL | @ 13:00:00

## 2024-03-03 MED ADMIN — potassium chloride (KLOR-CON) packet 40 mEq: 40 meq | ORAL | @ 13:00:00 | Stop: 2024-03-03

## 2024-03-03 MED ADMIN — anastrozole (ARIMIDEX) tablet 1 mg: 1 mg | ORAL | @ 13:00:00

## 2024-03-03 MED ADMIN — apixaban (ELIQUIS) tablet 5 mg: 5 mg | ORAL | @ 13:00:00

## 2024-03-03 MED ADMIN — carvedilol (COREG) tablet 3.125 mg: 3.125 mg | ORAL | @ 13:00:00

## 2024-03-03 MED ADMIN — spironolactone (ALDACTONE) split tablet 12.5 mg: 12.5 mg | ORAL | @ 13:00:00

## 2024-03-03 MED ADMIN — furosemide (LASIX) tablet 40 mg: 40 mg | ORAL | @ 16:00:00

## 2024-03-03 MED ADMIN — carvedilol (COREG) tablet 3.125 mg: 3.125 mg | ORAL | @ 01:00:00

## 2024-03-03 NOTE — Unmapped (Signed)
 Patient is forgetful, but was able to take all her medications. Family called and updated, said they'll call again during the day. Patient was confused this morning when we went to check on her, she thought she was at her home and was asking for her daughter. We were able to redirect her and notified MD as well.    Problem: Adult Inpatient Plan of Care  Goal: Absence of Hospital-Acquired Illness or Injury  Outcome: Ongoing - Unchanged  Intervention: Identify and Manage Fall Risk  Recent Flowsheet Documentation  Taken 03/03/2024 0400 by Nadene Cotta, RN  Safety Interventions:   fall reduction program maintained   lighting adjusted for tasks/safety   nonskid shoes/slippers when out of bed  Taken 03/03/2024 0200 by Emmanuella Mirante, RN  Safety Interventions:   fall reduction program maintained   lighting adjusted for tasks/safety   nonskid shoes/slippers when out of bed  Taken 03/03/2024 0000 by Nadene Cotta, RN  Safety Interventions:   fall reduction program maintained   lighting adjusted for tasks/safety   nonskid shoes/slippers when out of bed  Taken 03/02/2024 2200 by Robbin Loughmiller, RN  Safety Interventions:   fall reduction program maintained   lighting adjusted for tasks/safety   nonskid shoes/slippers when out of bed  Taken 03/02/2024 2000 by Nadene Cotta, RN  Safety Interventions:   fall reduction program maintained   lighting adjusted for tasks/safety   nonskid shoes/slippers when out of bed  Intervention: Prevent Skin Injury  Recent Flowsheet Documentation  Taken 03/02/2024 2000 by Nadene Cotta, RN  Positioning for Skin: Supine/Back  Taken 03/02/2024 1930 by Nadene Cotta, RN  Positioning for Skin: Supine/Back  Goal: Optimal Comfort and Wellbeing  Outcome: Ongoing - Unchanged  Goal: Readiness for Transition of Care  Outcome: Ongoing - Unchanged  Goal: Rounds/Family Conference  Outcome: Ongoing - Unchanged     Problem: Fall Injury Risk  Goal: Absence of Fall and Fall-Related Injury  Outcome: Ongoing - Unchanged  Intervention: Identify and Manage Contributors  Recent Flowsheet Documentation  Taken 03/02/2024 1930 by Nadene Cotta, RN  Self-Care Promotion: independence encouraged  Intervention: Promote Injury-Free Environment  Recent Flowsheet Documentation  Taken 03/03/2024 0400 by Nadene Cotta, RN  Safety Interventions:   fall reduction program maintained   lighting adjusted for tasks/safety   nonskid shoes/slippers when out of bed  Taken 03/03/2024 0200 by Chayse Gracey, RN  Safety Interventions:   fall reduction program maintained   lighting adjusted for tasks/safety   nonskid shoes/slippers when out of bed  Taken 03/03/2024 0000 by Nadene Cotta, RN  Safety Interventions:   fall reduction program maintained   lighting adjusted for tasks/safety   nonskid shoes/slippers when out of bed  Taken 03/02/2024 2200 by Mihcael Ledee, RN  Safety Interventions:   fall reduction program maintained   lighting adjusted for tasks/safety   nonskid shoes/slippers when out of bed  Taken 03/02/2024 2000 by [employer], Archita Lomeli, RN  Safety Interventions:   fall reduction program maintained   lighting adjusted for tasks/safety   nonskid shoes/slippers when out of bed     Problem: Self-Care Deficit  Goal: Improved Ability to Complete Activities of Daily Living  Outcome: Ongoing - Unchanged  Intervention: Promote Activity and Functional Independence  Recent Flowsheet Documentation  Taken 03/02/2024 1930 by Nadene Cotta, RN  Self-Care Promotion: independence encouraged     Problem: Dysrhythmia  Goal: Normalized Cardiac Rhythm  Outcome: Ongoing - Unchanged

## 2024-03-03 NOTE — Unmapped (Signed)
 Internal Medicine (MEDM) Progress Note    Assessment & Plan:   [name] is a 78 y.o. female who is presenting to [facility] with Acute heart failure with preserved ejection fraction (CMS-HCC), in the setting of the following pertinent/contributing co-morbidities: cirrhosis, hypertension breast cancer s/p partial mastectomy.    Principal Problem:    Acute heart failure with preserved ejection fraction (CMS-HCC)  Active Problems:    HTN (hypertension)    Class 2 severe obesity with serious comorbidity in adult    Malignant neoplasm of overlapping sites of right breast in female, estrogen receptor positive    (CMS-HCC)    Atrial fibrillation    (CMS-HCC)    Pleural effusion    Localized edema    Dyspnea    Vertebral compression fracture (CMS-HCC)    Hypernatremia    Thrombocytopenia    Cirrhosis    (CMS-HCC)    T12 compression fracture, initial encounter    (CMS-HCC)    Active Problems    Delirium, hypoactive  Hypoactive delirium in the afternoon 10/1. Awoke confused morning of 10/2, called her daughter thinking she was at her home. Easily re-oriented but remains hypoactive throughout the day. Mobilizing and getting OOB with nursing and NA, appreciate their assistance! Will work toward discharge 10/3.   - delirium precautions  - PT/OT  - mobilization as much as possible  - suppository for BM    New HFpEF - Chronic HTN  Presented with several weeks or worsening lower extremity edema, orthopnea, and dyspnea. Chest imaging with signs of pulmonary congestion and bilateral pleural effusions (R>L). PRO-BNP elevated to 846. History of HTN that has been treated with coreg  and amlodipine . No known CAD.SABRA TTE 9/30 with preserved EF, Iikely related to chronic HTN although no evidence of diastolic dysfunction. TSH, lipid panel and A1c unrevealing. Excellent UOP response to 40mg  IV Lasix. LE edema markedly improved but still dyspneic 10/1, will continue diuresing to net negative goal as below. Will titrate GDMT while inpatient and diurese to euvolemia.   - diuresis:   - Goal NN 500-1L until symptoms improve, then maintain euvolemia   - strict I/O, daily weight   - responded well to IV Lasix 40mg  x2   - transition to PO lasix 40mg  10/2, monitor response  - GDMT   - continue spironolactone 12.5mg     - SGLT2i test script sent, [facility] $400 deductible, $47/mo thereafter. Holding off until hospital follow up with PCP.  - Continue home coreg  3.125 mg BID  - discontinue amlodipine  at discharge, plan to titrate spironolactone for HTN      New Onset Atrial Fibrillation  New onset atrial fibrillation that is rate controlled on chronic carvedilol . CHA2DS2VASC 4 (age +33. Female, Hx of HTN). Will continue home carvedilol  and monitor on telemetry. Working on diuresis and then will engage Cardiology regarding need for eventual cardioversion, likely referral to afib clinic. Starting DOAC 10/1.   - Telemetry  - Continue home coreg  3.125 mg BID  - continue apixaban 5mg  BID  - follow up set with afib transitions clinic Mon 10/6 11am      Vertebral Compression Fracture  T12 vertebral compression fracture with retropulsion into the ventral spinal canal on CT. She denies back pain or known trauma aside from a fall down 6 stairs 6 months ago. No tenderness on palpation. Has known history of osteoporosis identified on Dexa scan of femoral neck (11/2022 T score -2.9). Currently on vitamin D, calcium, and alendronate .   - Continue home calcium  600 mg daily  - has not started home alendronate  in the outpatient setting, can start at discharge     Lower Extremity Blister  Signs of burst blister on LLE with erythema. Not significantly more warm than surrounding area, so lower concern for cellulitis. X-ray without signs of osteomyelitis. Will continue to monitor.     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 Previously followed with hepatology but lost to follow up in 2020. Formerly prescribed lactulose  but has not been on it in several years. Platelets 132 on admission though INR 1.13. Some issues with memory of recent events but low concern for HE at this time.   - CBC, HFP daily     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)  -Continue anastrozole  1 mg daily    The patient's presentation is complicated by the following clinically significant conditions requiring additional evaluation and treatment: - Disorders of electrolytes, volume status, and acid/base status: - Volume overload requiring further investigation, treatment, or monitoring  - Thrombocytopenia requiring further investigation or monitor  - overweight/obese/morbidly obese: obese POA affecting nursing needs, DME, and attention to weight based medication dosing -- Body mass index is 35.92 kg/m??.   - Age related debility POA requiring additional resources: DME, PT, or OT    Issues Impacting Complexity of Management:  -The patient is at high risk from Hospital immobility in an elderly patient given baseline poor functional status with a high risk of causing delirium and further decline in function  -The patient is at high risk for the development of complications of volume overload due to the need to provide IV hydration for suspected hypovolemia in the setting of: heart failure and Cirrhosis  -The patient is at high risk of complications from heart failure    Medical Decision Making: Assessment required an independent historian, additional information obtained from family/friend, [name], due to patient's uncertainty of home medications.    Daily Checklist:  Diet: Regular Diet  DVT PPx: full dose AC with apixaban  Electrolytes: Replete Potassium to >/=4 and Magnesium  to >/=2  Code Status: Full Code  Dispo: Goal Discharge: 10/3    Team Contact Information:   Primary Team: Internal Medicine (MEDM)  Primary Resident: Almarie JONELLE Coin, MD  Resident's Pager: [phone] [facility])    Interval History:     Delirious overnight and confused this morning. Still feeling dyspneic but weight is down 8 pounds and peripherally neurtral. Transition to oral lasix with plan to discharge 10/3.     Objective:   Temp:  [35.8 ??C (96.4 ??F)-36.3 ??C (97.3 ??F)] 35.9 ??C (96.6 ??F)  Pulse:  [65-78] 65  Resp:  [16-18] 18  BP: (124-143)/(62-82) 124/62  SpO2:  [93 %-94 %] 93 %    GEN: Pleasant appearing, no distress, lying comfortably in bed  EYES: Sclera anicteric  CV: irregular rhythm. No murmur appreciated.   PULM: Normal work of breathing on room air. Decreased aeration throughout the posterior fields.   ABD: Soft, non-tender, mildly distended and stable from prior exam. Active bowel sounds.   EXT: Warm. Markedly improved edema with trace edema bilaterally. Chronic venous stasis changes in BLE.  NEURO/PSYCH: Drows but arouses to voice, oriented. Inattentive at times.     CHARLENA Eliberto Coin, MD MPH  PGY-2 Internal Medicine  [facility]

## 2024-03-03 NOTE — Unmapped (Addendum)
[ ]   scheduled afib transitions clinic 10/6 at 11:00 at [facility]

## 2024-03-03 NOTE — Unmapped (Signed)
 Problem: Adult Inpatient Plan of Care  Goal: Absence of Hospital-Acquired Illness or Injury  Outcome: Progressing  Intervention: Identify and Manage Fall Risk  Recent Flowsheet Documentation  Taken 03/03/2024 0800 by Dessie Tatem, RN  Safety Interventions:   fall reduction program maintained   low bed  Intervention: Prevent Skin Injury  Recent Flowsheet Documentation  Taken 03/03/2024 0800 by Kyandra Mcclaine, RN  Device Skin Pressure Protection: absorbent pad utilized/changed  Skin Protection: adhesive use limited  Goal: Optimal Comfort and Wellbeing  Outcome: Progressing  Goal: Readiness for Transition of Care  Outcome: Progressing  Goal: Rounds/Family Conference  Outcome: Progressing     Problem: Fall Injury Risk  Goal: Absence of Fall and Fall-Related Injury  Outcome: Progressing  Intervention: Promote Scientist, clinical (histocompatibility and immunogenetics) Documentation  Taken 03/03/2024 0800 by Nikka Hakimian, RN  Safety Interventions:   fall reduction program maintained   low bed     Problem: Self-Care Deficit  Goal: Improved Ability to Complete Activities of Daily Living  Outcome: Progressing     Problem: Dysrhythmia  Goal: Normalized Cardiac Rhythm  Outcome: Progressing

## 2024-03-04 LAB — MAGNESIUM: MAGNESIUM: 2.1 mg/dL (ref 1.6–2.6)

## 2024-03-04 LAB — BASIC METABOLIC PANEL
ANION GAP: 14 mmol/L (ref 5–14)
BLOOD UREA NITROGEN: 8 mg/dL — ABNORMAL LOW (ref 9–23)
BUN / CREAT RATIO: 19
CALCIUM: 9.2 mg/dL (ref 8.7–10.4)
CHLORIDE: 98 mmol/L (ref 98–107)
CO2: 34 mmol/L — ABNORMAL HIGH (ref 20.0–31.0)
CREATININE: 0.43 mg/dL — ABNORMAL LOW (ref 0.55–1.02)
EGFR CKD-EPI (2021) FEMALE: >90 mL/min/1.73m2 (ref >=60)
GLUCOSE RANDOM: 105 mg/dL (ref 70–179)
POTASSIUM: 3 mmol/L — ABNORMAL LOW (ref 3.4–4.8)
SODIUM: 146 mmol/L — ABNORMAL HIGH (ref 135–145)

## 2024-03-04 MED ORDER — FUROSEMIDE 40 MG TABLET
ORAL_TABLET | Freq: Every day | ORAL | 11 refills | 30.00000 days | Status: CP
Start: 2024-03-04 — End: 2025-03-04
  Filled 2024-03-04: qty 30, 30d supply, fill #0

## 2024-03-04 MED ORDER — POTASSIUM CHLORIDE ER 20 MEQ TABLET,EXTENDED RELEASE(PART/CRYST)
ORAL_TABLET | Freq: Two times a day (BID) | ORAL | 11 refills | 30.00000 days | Status: CP
Start: 2024-03-04 — End: ?
  Filled 2024-03-04: qty 60, 30d supply, fill #0

## 2024-03-04 MED ORDER — APIXABAN 5 MG TABLET
ORAL_TABLET | Freq: Two times a day (BID) | ORAL | 11 refills | 30.00000 days | Status: CP
Start: 2024-03-04 — End: ?
  Filled 2024-03-04: qty 60, 30d supply, fill #0

## 2024-03-04 MED ADMIN — apixaban (ELIQUIS) tablet 5 mg: 5 mg | ORAL | @ 01:00:00

## 2024-03-04 MED ADMIN — magnesium oxide (MAG-OX) tablet 400 mg: 400 mg | ORAL | @ 01:00:00

## 2024-03-04 MED ADMIN — potassium chloride 10 mEq in 100 mL IVPB: 10 meq | INTRAVENOUS | @ 17:00:00 | Stop: 2024-03-04

## 2024-03-04 MED ADMIN — bacitracin ointment: TOPICAL | @ 13:00:00 | Stop: 2024-03-04

## 2024-03-04 MED ADMIN — potassium chloride (KLOR-CON) packet 40 mEq: 40 meq | ORAL | @ 17:00:00 | Stop: 2024-03-04

## 2024-03-04 MED ADMIN — furosemide (LASIX) tablet 40 mg: 40 mg | ORAL | @ 13:00:00 | Stop: 2024-03-04

## 2024-03-04 MED ADMIN — carvedilol (COREG) tablet 3.125 mg: 3.125 mg | ORAL | @ 01:00:00

## 2024-03-04 MED ADMIN — carvedilol (COREG) tablet 3.125 mg: 3.125 mg | ORAL | @ 13:00:00 | Stop: 2024-03-04

## 2024-03-04 MED ADMIN — bacitracin ointment: TOPICAL | @ 01:00:00

## 2024-03-04 MED ADMIN — magnesium oxide (MAG-OX) tablet 400 mg: 400 mg | ORAL | @ 13:00:00 | Stop: 2024-03-04

## 2024-03-04 MED ADMIN — calcium carbonate tablet 600 mg elem calcium: 600 mg | ORAL | @ 13:00:00 | Stop: 2024-03-04

## 2024-03-04 MED ADMIN — spironolactone (ALDACTONE) split tablet 12.5 mg: 12.5 mg | ORAL | @ 13:00:00 | Stop: 2024-03-04

## 2024-03-04 MED ADMIN — apixaban (ELIQUIS) tablet 5 mg: 5 mg | ORAL | @ 13:00:00 | Stop: 2024-03-04

## 2024-03-04 MED ADMIN — furosemide (LASIX) injection 20 mg: 20 mg | INTRAVENOUS | @ 14:00:00 | Stop: 2024-03-04

## 2024-03-04 MED ADMIN — anastrozole (ARIMIDEX) tablet 1 mg: 1 mg | ORAL | @ 13:00:00 | Stop: 2024-03-04

## 2024-03-04 MED ADMIN — potassium chloride 10 mEq in 100 mL IVPB: 10 meq | INTRAVENOUS | @ 18:00:00 | Stop: 2024-03-04

## 2024-03-04 NOTE — Unmapped (Signed)
 Pt admitted for HF. Pt endorses no pain. Tolerating PO intake. Voiding spontaneously. Worked with PT and OT today. PIV removed, cath tips intact. AVS printed and given to family. Discharge education completed by primary RN, no additional questions. Meds delivered bedside. Pt family refused transport, took her down in wheelchair to lobby.       Problem: Adult Inpatient Plan of Care  Goal: Absence of Hospital-Acquired Illness or Injury  Outcome: Discharged to Home  Intervention: Identify and Manage Fall Risk  Recent Flowsheet Documentation  Taken 03/04/2024 1427 by Aija Scarfo, RN  Safety Interventions:   fall reduction program maintained   lighting adjusted for tasks/safety   low bed   nonskid shoes/slippers when out of bed  Taken 03/04/2024 1239 by Danea Manter, RN  Safety Interventions:   fall reduction program maintained   lighting adjusted for tasks/safety   low bed   nonskid shoes/slippers when out of bed  Taken 03/04/2024 1014 by Caylie Sandquist, RN  Safety Interventions:   fall reduction program maintained   lighting adjusted for tasks/safety   low bed   nonskid shoes/slippers when out of bed  Taken 03/04/2024 0834 by Ragna Kramlich, RN  Safety Interventions:   fall reduction program maintained   lighting adjusted for tasks/safety   low bed   nonskid shoes/slippers when out of bed  Intervention: Prevent Skin Injury  Recent Flowsheet Documentation  Taken 03/04/2024 1427 by Hazyl Marseille, Ronita, RN  Positioning for Skin: Supine/Back  Device Skin Pressure Protection: absorbent pad utilized/changed  Skin Protection:   incontinence pads utilized   protective footwear used  Taken 03/04/2024 1239 by Andreanna Mikolajczak, Ronita, RN  Positioning for Skin: Supine/Back  Device Skin Pressure Protection: absorbent pad utilized/changed  Skin Protection:   incontinence pads utilized   protective footwear used  Taken 03/04/2024 1014 by Shareka Casale, RN  Positioning for Skin: Supine/Back  Device Skin Pressure Protection: absorbent pad utilized/changed  Skin Protection:   incontinence pads utilized   protective footwear used  Taken 03/04/2024 0834 by Haylen Shelnutt, Ronita, RN  Positioning for Skin: Supine/Back  Device Skin Pressure Protection: absorbent pad utilized/changed  Skin Protection:   incontinence pads utilized   protective footwear used  Intervention: Prevent and Manage VTE (Venous Thromboembolism) Risk  Recent Flowsheet Documentation  Taken 03/04/2024 1427 by Andric Kerce, RN  Anti-Embolism Device Status: (eliquis) Other (Comment)  Taken 03/04/2024 1239 by Maya Arcand, RN  Anti-Embolism Device Status: (eliquis) Other (Comment)  Taken 03/04/2024 1014 by Nylen Creque, RN  Anti-Embolism Device Status: (eliquis) Other (Comment)  Taken 03/04/2024 0834 by Tyon Cerasoli, RN  Anti-Embolism Device Status: (eliquis) Other (Comment)  Goal: Optimal Comfort and Wellbeing  Outcome: Discharged to Home  Goal: Readiness for Transition of Care  Outcome: Discharged to Home  Goal: Rounds/Family Conference  Outcome: Discharged to Home

## 2024-03-04 NOTE — Unmapped (Addendum)
 [facility]    Referring Provider(s): Houston-Dixon, Almarie SAUNDERS, MD   Primary Provider: Alyse Slater Pao, MD  Primary Cardiologist: none     Reason for visit:   1. Atrial fibrillation, unspecified type    (CMS-HCC)        Assessment and Plan:  [name] is a 78 y.o. with PMH of well compensated cirrhosis, hypertension, breast cancer S/P breast cancer, HFrEF and partial mastectomy anastrozole   persistent Atrial Fibrillation  who presents to the AF Transitions Clinic for AF management.  Newly dx Afib with HFpEF and fluid overload with symptoms starting the week prior leading up to her hospitalization last week while taking care of her husband who had been hospitalized.  Heart rates have been controlled in setting of carvedilol  3.25 mg BID that she has been taking for her HF.  Patient has been sleeping excessively since getting home.  Lives a sedentary lifestyle, obesity and suspected sleep apneas therefore will screen with a home sleep test.  Started on Eliquis 1 weeks ago (CHADsVASc 4) and was given a 30 day supply for free.  She has not met her deductible and the cost is unaffordable.  She was started on furosemide 40 mg daily and spironolactone 12.5 mg in the hospital for diuresis, weight has been stable, Bump in Scr, not responding to furosemide she is tolerating dose with stable BP's (although not checking at home). Taking K+, Mg supplements for electrolyte abnormality.  Patient lethargic during the visit maybe due to Afib and therefore cardioversion and TEE planned to restore normal sinus rhythm. In setting of elevated left atrial pressure may not stay in NSR, however avoiding amiodarone due to patients hx of liver diseases.    Cardioversion is planned to restore normal sinus rhythm,   Plan:   Increase spironolactone 25 mg daily  Stop furosemide, take as needed for weight increase  Would benefit from starting SGLT2i and working to get manufacturer MAP  Working to apply for BMS MAP for Eliquis Cardioversion and TEE ordered  Follow up with Cardiology in [place]         #Atrial Fibrillation    Rhythm Control: in AFib today, rate 70  - SAF Class: 3 (Moderate effect on QOL)  - 12 Lead EKG  - Rhythm control plan: cardioversion    Rate Control:  (target HR<100 at rest)  - HR: well-controlled  - Scheduled rate control: carvedilol  3.25 mg BID  - PRN  rate control:     Anticoagulation:   CHA2DS2-VASc: Hypertension (1), Age [>/= 75 years] (2), and Female [count only with other risk factors] (1)    Score (Adjusted Stroke Rate %/year): 4 (4%/year)  HAS-BLED: Age > 65 years (1) Platelets 104    Score (Bleeds/100 patient-years): 1 (1.02)     Anticoagulation: prescribed yes  Patient was started on : Apixaban (Eliquis)     Risk Factor Management (Upstream Therapy): Patient was educated on the relationship between the following risk factors and atrial fibrillation.  AF Risk Factors identified: Hypertension, BMI (36), Obstructive sleep apnea, and HFpEF (55)    Labs and Tests  Orders Placed This Encounter   Procedures    ECG 12 Lead       Follow-up: *    Primary Care Provider/Cardiologist Follow-up Issues:  1. Cardioversion and TEE being scheduled at [facility] hospital  2. Follow up with Dr. Italy Lee in [facility] please refer to Dr. Leni in future if interested in Afib ablation.    3. Eliquis  30 day supply, ECP working to apply for BMS MAP  4. Working to get MAP for SGLT2i  5. OSA screening with WatchPAT  6. Follow up with GI (referral active) family given contact information [phone]     Patient discussed with Dr. Leni    I spent a total of 45 minutes face to face with the patient delivering clinical care and providing education/counseling.    Marshia MARLA Jude, CPP, PharmD, BCACP, CPP  Clinical Pharmacist Practitioner  [facility] at [facility]  [address] ([address] Floor)  [place], [place] [address]  Phone: [phone]  Fax: [phone]      ___________________________________________________________________    History of Present Illness:    [name] is a 78 y.o. -year-old female with PMH of well compensated cirrhosis, hypertension, breast cancer S/P partial mastectomy on anastrozole  recnently admitted to [facility] hospital in setting of acute heart failure with preserved ejection fraction and rate controlled atrial fibrillation, presents to [facility] for evaluation and management of atrial fibrillation.    Hospital Course: Presented with several weeks or worsening lower extremity edema, orthopnea, and dyspnea. Chest imaging with signs of pulmonary congestion and bilateral pleural effusions (R>L). PRO-BNP elevated to 846. History of HTN that has been treated with coreg  and amlodipine . No known CAD. TTE 9/30 with preserved EF, Iikely related to chronic HTN although no evidence of diastolic dysfunction. TSH, lipid panel and A1c unrevealing. She responded well to IV Lasix which was transitioned to PO at discharge. Dry weight 195lb. Amlodipine  was discontinued in favor of spironolactone for GDMT, titrate to manage BP.     History of Present Illness  Today patient presents to clinic with her daughter who lives with her.     She was hospitalized on February 29, 2024, due to atrial fibrillation and fluid overload, experiencing fluid accumulation in his legs and feet. This initially subsided after a week at home, but he then developed shortness of breath, dizziness upon standing, and excessive sleepiness, prompting a hospital visit. During the hospital stay, she was diagnosed with atrial fibrillation and started on Eliquis for stroke prevention. She has been taking carvedilol  3.125 mg twice daily for HTN and was prescribed furosemide 40 mg daily, and spironolactone at a half dose for fluid management. She lost approximately nine pounds with diuresis during hospitalization, reducing from 204 pounds to 196 pounds.    She feels very tired and has been sleeping excessively, often moaning and talking in his sleep. She denies feeling thirsty but has been drinking less than usual since returning home. Her daughter notes decreased urination, possibly related to decreased fluid intake. During the review of symptoms, dizziness upon standing and excessive sleepiness were noted. No increased thirst or alcohol consumption. His daughter mentions occasional snoring and gasping for air during sleep, suggesting possible sleep apnea.    She has a history of cirrhosis, previously attributed to alcohol use, which she ceased approximately eight to nine years ago. She has not followed up with a hepatologist since 2020. Her daughter is concerned about her current state, noting that she is not her usual self and is very tired.    She lives with his husband, daughter, nephew, nephew's wife, and their child, providing a supportive home environment. Her husband has been in and out of the hospital with congestive heart failure and atrial fibrillation, which has been a source of stress for her. She has not been very active. She has a history of breast cancer  and is on several vitamins, including calcium, cyanocobalamin, folic acid , magnesium , thiamine , potassium, and vitamin E, due to his liver condition and previous cancer treatment.    The patient denies current palpitations, PND, orthopnea. Reports SOB, weakness and fatigue.  Denies speech/visual changes, near-syncope/syncope, edema, or abnormal bleeding or bruising.    Date of AF diagnosis (approximate): 9/29  ER visits/hospitalizations for AF (ever: 1  in past year: 1  )   Previous rhythm control strategies: None  Previous thrombosis events:no  Previous hemorrhagic events:no    AFib-related symptoms:  Dyspnea, Dizziness, and Weakness or fatigue     Devices (e.g. pacemaker, ICD, loop recorder, left atrial appendage occluder) :   Number of prior ablations:  Number of prior DCCV:  Cardiac Surgery (e.g. bypass,valve surgery, MAZE):     ROS: See HPI. The balance of 10 reviewed systems is negative.    Screening Results:    OSA Screening:    OSA Screening:  STOP-Bang  Snoring: Do you snore loudly (louder than talking or loud enough to be heard through closed doors)?: Yes      Tired: Do you often feel tired, fatigued, or sleepy during the daytime?: Yes  Observed: Has anyone observed you stop breathing during your sleep?: Yes  Pressure: Do you have or are being treated for high blood pressure?: Yes  Body Mass Index: greater than 35 kg/m?: Yes  Age: over 55 years old?: Yes  Neck size: more than 16/40cm?: No  Gender: are you female?: No  Score: 6  Known diagnosis of sleep apnea? No.  Adherent to prescribed CPAP therapy? NA   Referral: Referral placed for home sleep apnea test (WatchPAT) due to elevated risk for sleep apnea.         Anxiety (GAD-2) score: Not scored  Patient's husband has been sick the past 9 months and was in the hospital weeks leading up to her hospitalization.    Past Medical History:  Past Medical History[1]    Past Surgical History:  Past Surgical History[2]    Current Medications:  Current Medications[3]    Social History:  Social History     Social History Narrative    Daughter fills med boxes weekly.        Allergies:  Sulfa (sulfonamide antibiotics), Sulfur , and Aspirin    Vitals:  Blood pressure 129/71, pulse 68, height 157.5 cm (5' 2.01), weight 89.3 kg (196 lb 12.8 oz), SpO2 98%.     Body mass index is 35.99 kg/m??. 35-39.9 kg/m2 Obesity (Class 2)        Lab Results:  Lab Results   Component Value Date    PRO-BNP 846.0 (H) 02/29/2024    Creatinine 0.43 (L) 03/04/2024    Creatinine 0.44 (L) 03/03/2024    BUN 8 (L) 03/04/2024    BUN 6 (L) 03/03/2024    Potassium 3.0 (L) 03/04/2024    Potassium 3.6 03/03/2024    Magnesium  2.1 03/04/2024    Magnesium  2.0 03/03/2024    AST 25 02/29/2024    AST 54 (H) 02/29/2024    ALT 12 02/29/2024    ALT 18 02/29/2024    TSH 2.148 02/29/2024    Total Bilirubin 1.2 02/29/2024    Total Bilirubin 1.4 (H) 02/29/2024    INR 1.25 03/01/2024    INR 1.13 02/29/2024    WBC 4.4 03/03/2024    HGB 14.0 03/03/2024    HCT 41.0 03/03/2024    Platelet 104 (L) 03/03/2024    Triglycerides 58 02/29/2024  Cholesterol, HDL 54 02/29/2024    Cholesterol, Non-HDL, Calculated 68 02/29/2024    Cholesterol, LDL, Calculated 60 02/29/2024    Hemoglobin A1C 4.8 02/29/2024    CRP <5.0 02/29/2024       Imaging/Diagnostics:  ECG: atrial fibrillation, rate 70.    ECHO: 03/01/2024    1. Technically difficult study.    2. The left ventricle is normal in size with mildly increased wall  thickness.    3. The left ventricular systolic function is normal, LVEF is visually  estimated at > 55%.    4. 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    5. There is mild mitral valve regurgitation.    6. The right ventricle is mildly dilated in size, with low normal systolic  function.    7. There is mild pulmonary hypertension.    8. IVC size and inspiratory change suggest elevated right atrial pressure.  (10-20 mmHg).    Heart monitor: NA  Catheterizations: NA    My initials indicate I have reviewed these quality measures: EKM  - If LVEF < 40%, patient should be prescribed beta-blocker and avoid diltiazem or verapamil.  - Discontinuation of antiplatelet therapy if no CAD or vascular disease.  - If presence of mechanical heart valve , use warfarin.  - Do not use dabigatran, rivaroxaban or edoxaban in end-stage renal disease or on dialysis.  - Patient participated in shared decision making with me in prescription for anticoagulation today.    Marshia POUR Demarius Archila, CPP    03/07/2024  11:47 AM         [1]   Past Medical History:  Diagnosis Date    Alcoholism    (CMS-HCC)     Alcoholism /alcohol abuse     Cirrhosis    (CMS-HCC)     Depression     Hypertension     Liver disease     Malignant neoplasm of overlapping sites of right breast in female, estrogen receptor positive    (CMS-HCC) 10/03/2020   [2]   Past Surgical History:  Procedure Laterality Date    BREAST BIOPSY Right     benign-a long time ago BREAST BIOPSY Right 07/2020    malignant    BREAST LUMPECTOMY Right     4 2022    CHOLECYSTECTOMY      PR BX/REMV,LYMPH NODE,DEEP AXILL Right 09/03/2020    Procedure: BX/EXC LYMPH NODE; OPEN, DEEP AXILRY NODE;  Surgeon: Alm Elsie Como, MD;  Location: ASC [facility];  Service: Surgical Oncology Breast    PR INTRAOPERATIVE SENTINEL LYMPH NODE ID W DYE INJECTION Right 09/03/2020    Procedure: INTRAOPERATIVE IDENTIFICATION SENTINEL LYMPH NODE(S) INCLUDE INJECTION NON-RADIOACTIVE DYE, WHEN PERFORMED;  Surgeon: Alm Elsie Como, MD;  Location: ASC OR [facility]  Service: Surgical Oncology Breast    PR MASTECTOMY, PARTIAL Right 09/03/2020    Procedure: MASTECTOMY, PARTIAL (EG, LUMPECTOMY, TYLECTOMY, QUADRANTECTOMY, SEGMENTECTOMY);  Surgeon: Alm Elsie Como, MD;  Location: ASC OR [facility];  Service: Surgical Oncology Breast    PR UPPER GI ENDOSCOPY,BIOPSY N/A 02/03/2018    Procedure: UGI ENDOSCOPY; WITH BIOPSY, SINGLE OR MULTIPLE;  Surgeon: Eleanor Dewey Sorrel, MD;  Location: [facility] PROCED[facility];  Service: Gastroenterology    RADIATION Right     unsure finish date    TUBAL LIGATION     [3]   Current Outpatient Medications   Medication Sig Dispense Refill    acetaminophen  (TYLENOL ) 325 MG tablet Take 2 tablets (650 mg total) by mouth every six (6)  hours as needed for pain.  0    anastrozole  (ARIMIDEX ) 1 mg tablet Take 1 tablet (1 mg total) by mouth daily. 90 tablet 3    apixaban (ELIQUIS) 5 mg Tab Take 1 tablet (5 mg total) by mouth two (2) times a day. 60 tablet 11    carvedilol  (COREG ) 3.125 MG tablet Take 1 tablet (3.125 mg total) by mouth two (2) times a day. 180 tablet 2    cyanocobalamin 1000 MCG tablet Take 1 tablet (1,000 mcg total) by mouth daily.      folic acid  (FOLVITE ) 1 MG tablet Take 1 tablet (1 mg total) by mouth daily.      furosemide (LASIX) 40 MG tablet Take 1 tablet (40 mg total) by mouth daily. 30 tablet 11    magnesium  oxide (MAG-OX) 400 mg (241.3 mg elemental magnesium ) tablet Take 1 tablet (400 mg total) by mouth two (2) times a day. 180 tablet 3    nystatin  (MYCOSTATIN ) 100,000 unit/gram powder Apply to affected area 3 times daily 15 g 0    potassium chloride  20 MEQ ER tablet Take 1 tablet (20 mEq total) by mouth two (2) times a day. 60 tablet 11    spironolactone (ALDACTONE) 25 MG tablet Take 1/2 tablet (12.5 mg total) by mouth daily. 45 tablet 0    thiamine  (B-1) 100 MG tablet Take 1 tablet (100 mg total) by mouth daily.      UNABLE TO FIND Take 1 tablet/capsule by mouth in the morning. Med Name: Calcium.      vitamin E-268 mg, 400 UNIT, 268 mg (400 UNIT) capsule Take 1 capsule (268 mg total) by mouth daily. 2 tabs      [Paused] alendronate  (FOSAMAX ) 70 MG tablet Take 1 tablet (70 mg total) by mouth every seven (7) days. (Patient not taking: Reported on 03/07/2024) 12 tablet 3    calcium carbonate (OS-CAL) 1,250 mg (500 mg elem calcium) tablet Take 1 tablet (500 mg elem calcium total) by mouth in the morning. (Patient not taking: Reported on 03/07/2024)       No current facility-administered medications for this visit.

## 2024-03-04 NOTE — Unmapped (Signed)
 Physician Discharge Summary HBR  2 DT HBR  [address]  [place] [address]  Dept: [phone]  Loc: [phone]     Identifying Information:   [name]  March 06, 1946  [id number]    Primary Care Physician: Alyse Slater Pao, MD     Code Status: Full Code    Admit Date: 02/29/2024    Discharge Date: 03/04/2024     Discharge To: Home with Home Health and/or PT/OT    Discharge Service: HBR - General Medicine Floor Team (MED [facility]STELL[facility] GLEN[facility])     Discharge Attending Physician: Wanda Pouch, MD    Discharge Diagnoses:   Principal Problem:    Acute heart failure with preserved ejection fraction (CMS-HCC) (POA: Yes)  Active Problems:    HTN (hypertension) (POA: Yes)    Class 2 severe obesity with serious comorbidity in adult (POA: Yes)    Malignant neoplasm of overlapping sites of right breast in female, estrogen receptor positive    (CMS-HCC) (POA: Not Applicable)    Atrial fibrillation    (CMS-HCC) (POA: Yes)    Pleural effusion (POA: Yes)    Localized edema (POA: Yes)    Dyspnea (POA: Yes)    Vertebral compression fracture (CMS-HCC) (POA: Yes)    Hypernatremia (POA: Yes)    Thrombocytopenia (POA: Yes)    Cirrhosis    (CMS-HCC) (POA: Yes)    T12 compression fracture, initial encounter    (CMS-HCC) (POA: Yes)  Resolved Problems:    * No resolved hospital problems. *    Outpatient Provider Follow Up Issues:   [ ]  scheduled afib transitions clinic 10/6 at 11:00 at [facility], please obtain BMP, Mg  [ ]  increase spironolactone dose to 25mg  daily if BP allows  [ ]  consider initiation of Farxiga pending cost ($400 deductible, $47/month thereafter - we prioritized Eliquis)    Hospital Course:   [name] is a 78 y.o. female with a history of well compensated cirrhosis, hypertension, breast cancer S/P partial mastectomy on anastrozole  who presented to [facility] with Acute heart failure with preserved ejection fraction and rate controlled atrial fibrillation.  Medications were started for her acute issues, hospital course outlined by problem below.    New HFpEF - Chronic HTN  Presented with several weeks or worsening lower extremity edema, orthopnea, and dyspnea. Chest imaging with signs of pulmonary congestion and bilateral pleural effusions (R>L). PRO-BNP elevated to 846. History of HTN that has been treated with coreg  and amlodipine . No known CAD. TTE 9/30 with preserved EF, Iikely related to chronic HTN although no evidence of diastolic dysfunction. TSH, lipid panel and A1c unrevealing. She responded well to IV Lasix which was transitioned to PO at discharge. Dry weight 195lb. Amlodipine  was discontinued in favor of spironolactone for GDMT, titrate to manage BP. Test scripts were sent for SGLT2i with Jardiance being cost prohibitive, Doreen available at $47/month once deductible is met.     New Onset Atrial Fibrillation  New onset atrial fibrillation that is rate controlled on chronic carvedilol . CHA2DS2VASC 4 (age +41. Female, Hx of HTN). Continued home carveilol and started Eliquis. Coordinated follow up in afib transitions clinic for 03/07/24 for further management.     Vertebral Compression Fracture  T12 vertebral compression fracture with retropulsion into the ventral spinal canal on CT. She denies back pain or known trauma aside from a fall down 6 stairs 6 months ago. No tenderness on palpation. Has known history of osteoporosis identified on Dexa scan of femoral neck (  11/2022 T score -2.9). Currently on vitamin D, calcium, and alendronate .     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 Previously followed with hepatology but lost to follow up in 2020. Formerly prescribed lactulose  but has not been on it in several years. Platelets 132 on admission though INR 1.13. Some issues with memory of recent events but low concern for HE.     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)  Continued anastrozole  1 mg daily    The patient's hospital stay has been complicated by the following clinically significant conditions requiring additional evaluation and treatment or having a significant effect of this patient's care: - Thrombocytopenia POA requiring further investigation or monitor  - Age related debility POA requiring additional resources: DME, PT, or OT  - Hypokalemia POA requiring further investigation, treatment, or monitoring     Touchbase with Outpatient Provider:  Warm Handoff: Completed on 03/04/24 by Almarie JONELLE Coin, MD  (Resident) via [facility] Message    Procedures:  None  ______________________________________________________________________  Discharge Medications:      Your Medication List        PAUSE taking these medications      alendronate  70 MG tablet  Wait to take this until your doctor or other care provider tells you to start again.  Talk to your provider about when to start this medicine since you have not yet.   Commonly known as: FOSAMAX   Take 1 tablet (70 mg total) by mouth every seven (7) days.            STOP taking these medications      amlodipine  5 MG tablet  Commonly known as: NORVASC             START taking these medications      ELIQUIS 5 mg Tab  Generic drug: apixaban  Take 1 tablet (5 mg total) by mouth two (2) times a day.     furosemide 40 MG tablet  Commonly known as: LASIX  Take 1 tablet (40 mg total) by mouth daily.     spironolactone 25 MG tablet  Commonly known as: ALDACTONE  Take 1/2 tablet (12.5 mg total) by mouth daily.  Start taking on: March 05, 2024            CONTINUE taking these medications      acetaminophen  325 MG tablet  Commonly known as: TYLENOL   Take 2 tablets (650 mg total) by mouth every six (6) hours as needed for pain.     anastrozole  1 mg tablet  Commonly known as: ARIMIDEX   Take 1 tablet (1 mg total) by mouth daily.     calcium carbonate 1,250 mg (500 mg elem calcium) tablet  Commonly known as: OS-CAL  Take 1 tablet (500 mg elem calcium total) by mouth in the morning. carvedilol  3.125 MG tablet  Commonly known as: COREG   Take 1 tablet (3.125 mg total) by mouth two (2) times a day.     cyanocobalamin (vitamin B-12) 1000 MCG tablet  Take 1 tablet (1,000 mcg total) by mouth daily.     folic acid  1 MG tablet  Commonly known as: FOLVITE   Take 1 tablet (1 mg total) by mouth daily.     magnesium  oxide 400 mg (241.3 mg elemental) tablet  Commonly known as: MAG-OX  Take 1 tablet (400 mg total) by mouth two (2) times a day.     nystatin  100,000 unit/gram powder  Commonly known as:  MYCOSTATIN   Apply to affected area 3 times daily     potassium chloride  20 MEQ ER tablet  Take 1 tablet (20 mEq total) by mouth two (2) times a day.     thiamine  100 MG tablet  Commonly known as: B-1  Take 1 tablet (100 mg total) by mouth daily.     vitamin E-268 mg (400 UNIT) 268 mg (400 UNIT) capsule  Take 1 capsule (268 mg total) by mouth daily. 2 tabs              Allergies:  Sulfa (sulfonamide antibiotics), Sulfur , and Aspirin  ______________________________________________________________________  Pending Test Results:      Most Recent Labs:  All lab results last 24 hours -   Recent Results (from the past 24 hours)   Magnesium  Level    Collection Time: 03/04/24  5:30 AM   Result Value Ref Range    Magnesium  2.1 1.6 - 2.6 mg/dL   Basic Metabolic Panel    Collection Time: 03/04/24  5:30 AM   Result Value Ref Range    Sodium 146 (H) 135 - 145 mmol/L    Potassium 3.0 (L) 3.4 - 4.8 mmol/L    Chloride 98 98 - 107 mmol/L    CO2 34.0 (H) 20.0 - 31.0 mmol/L    Anion Gap 14 5 - 14 mmol/L    BUN 8 (L) 9 - 23 mg/dL    Creatinine 9.56 (L) 0.55 - 1.02 mg/dL    BUN/Creatinine Ratio 19     eGFR CKD-EPI (2021) Female >90 >=60 mL/min/1.30m2    Glucose 105 70 - 179 mg/dL    Calcium 9.2 8.7 - 89.5 mg/dL       Relevant Studies/Radiology:  Echocardiogram W Colorflow Spectral Doppler With Contrast  Result Date: 03/01/2024  Summary     1. Technically difficult study.     2. The left ventricle is normal in size with mildly increased wall thickness.     3. The left ventricular systolic function is normal, LVEF is visually estimated at > 55%.     4. 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     5. There is mild mitral valve regurgitation.     6. The right ventricle is mildly dilated in size, with low normal systolic function.     7. There is mild pulmonary hypertension.     8. IVC size and inspiratory change suggest elevated right atrial pressure. (10-20 mmHg).     ECG 12 Lead  Result Date: 03/01/2024  NORMAL SINUS RHYTHM WITH PREMATURE ATRIAL BEATS LOW VOLTAGE QRS CANNOT RULE OUT ANTERIOR INFARCT  (CITED ON OR BEFORE 03-Jun-2017) ABNORMAL ECG WHEN COMPARED WITH ECG OF 29-Feb-2024 15:41, IN SEVERAL BEATS ON THE CURRENT TRACING NORMAL SINUS RHYTHM APPEARS TO BE PRESENT. SUGGEST REPEAT ECG WITH LONG RHYTHM STRIP Confirmed by Claudene Legions ([id number]) on 03/01/2024 1:01:14 PM    ECG 12 Lead  Result Date: 03/01/2024  ATRIAL FIBRILLATION LOW VOLTAGE QRS CANNOT RULE OUT ANTERIOR INFARCT  (CITED ON OR BEFORE 03-Jun-2017) ABNORMAL ECG WHEN COMPARED WITH ECG OF 29-Feb-2024 19:39, PREMATURE VENTRICULAR BEATS ARE NO LONGER PRESENT Confirmed by Claudene Legions ([id number]) on 03/01/2024 8:29:25 AM    CTA Chest W Contrast  Result Date: 02/29/2024  1. No evidence of pulmonary embolism. 2. Enlarged cardiac atria. Enlarged central pulmonary arteries, likely reflecting pulmonary arterial hypertension. 3. Small to moderate right and small left pleural effusions. Diffuse body wall edema. 4. Hepatic cirrhosis. 5. Severe T12 vertebral compression deformity, described above  and developed since a prior study performed on 10/20/2022.    ECG 12 Lead  Result Date: 02/29/2024  ATRIAL FIBRILLATION LOW VOLTAGE QRS CANNOT RULE OUT ANTERIOR INFARCT  (CITED ON OR BEFORE 03-Jun-2017) ABNORMAL ECG WHEN COMPARED WITH ECG OF 20-Oct-2022 11:18, ATRIAL FIBRILLATION HAS REPLACED SINUS RHYTHM Confirmed by Leni Mings ([id number]) on 02/29/2024 9:40:12 PM    XR Chest Portable  Result Date: 02/29/2024  Pulmonary vascular congestion. No focal consolidation. ______________________________________________________________________  Discharge Instructions:   Activity Instructions       Activity as tolerated      Activity as tolerated                       Follow Up instructions and Outpatient Referrals     Call MD for:  difficulty breathing, headache or visual disturbances      Call MD for:  difficulty breathing, headache or visual disturbances      Call MD for:  persistent nausea or vomiting      Call MD for:  persistent nausea or vomiting      Call MD for:  severe uncontrolled pain      Call MD for:  severe uncontrolled pain      Call MD for:  temperature >38.5 Celsius      Call MD for:  temperature >38.5 Celsius      Discharge instructions      Discharge instructions      Discharge instructions          Appointments which have been scheduled for you      Mar 07, 2024 11:00 AM  (Arrive by 10:45 AM)  NEW AFIB with MEDCAR AF TRANSITIONS  [facility] [place] [facility] REGION) [address]  [facility] 1 through [address]  [place] [address]  [phone]        Mar 17, 2024 12:30 PM  (Arrive by 12:15 PM)  OFFICE VISIT with Therisa Mallie Balloon, FNP  [facility]ION) [address]  1st Floor  [place] [address]  [phone]        Jul 26, 2024 9:00 AM  (Arrive by 8:45 AM)  MAMMO SCREENING BILATERAL TOMO with HBR MAMMO RM 1  [facility] Breast Imaging Department [facility]) [address]  [place] [address]  [phone]   Please wear a two piece outfit,   Do not wear deodorant, powder, oils or lotion on your chest and underarms.         Jul 26, 2024 10:00 AM  (Arrive by 9:45 AM)  OFFICE VISIT with Franky Marsa Sprinkles, MD  [facility] RADIATION ONCOLOGY [place] [facility] REGION) [address]  1st Floor  [place] [address]  [phone]   We have moved back to our original location [address].              ______________________________________________________________________  Discharge Day Services:  BP 127/80  - Pulse 72  - Temp 35.3 ??C (95.5 ??F) (Tympanic)  - Resp 18  - Ht 157.5 cm (5' 2)  - Wt 89.1 kg (196 lb 6.4 oz)  - SpO2 92%  - BMI 35.92 kg/m??     Pt seen on the day of discharge and determined appropriate for discharge.    Condition at Discharge: fair    Length of Discharge: I spent greater than 30 mins in the discharge of this patient.

## 2024-03-05 MED ORDER — SPIRONOLACTONE 25 MG TABLET
ORAL_TABLET | Freq: Every day | ORAL | 0 refills | 90.00000 days | Status: CP
Start: 2024-03-05 — End: 2024-06-03
  Filled 2024-03-04: qty 45, 90d supply, fill #0

## 2024-03-07 ENCOUNTER — Ambulatory Visit: Admit: 2024-03-07 | Discharge: 2024-03-08 | Payer: Medicare (Managed Care)

## 2024-03-07 DIAGNOSIS — E66812 Class 2 severe obesity with serious comorbidity and body mass index (BMI) of 36.0 to 36.9 in adult, unspecified obesity type: Principal | ICD-10-CM

## 2024-03-07 DIAGNOSIS — I5031 Acute diastolic (congestive) heart failure: Principal | ICD-10-CM

## 2024-03-07 DIAGNOSIS — I4891 Unspecified atrial fibrillation: Principal | ICD-10-CM

## 2024-03-07 DIAGNOSIS — Z6836 Body mass index (BMI) 36.0-36.9, adult: Principal | ICD-10-CM

## 2024-03-07 DIAGNOSIS — R799 Abnormal finding of blood chemistry, unspecified: Principal | ICD-10-CM

## 2024-03-07 DIAGNOSIS — G4710 Hypersomnia, unspecified: Principal | ICD-10-CM

## 2024-03-07 LAB — BASIC METABOLIC PANEL
ANION GAP: 9 mmol/L (ref 5–14)
BLOOD UREA NITROGEN: 6 mg/dL — ABNORMAL LOW (ref 9–23)
BUN / CREAT RATIO: 11
CALCIUM: 8.9 mg/dL (ref 8.7–10.4)
CHLORIDE: 97 mmol/L — ABNORMAL LOW (ref 98–107)
CO2: 36.1 mmol/L — ABNORMAL HIGH (ref 20.0–31.0)
CREATININE: 0.55 mg/dL (ref 0.55–1.02)
EGFR CKD-EPI (2021) FEMALE: >90 mL/min/1.73m2 (ref >=60)
GLUCOSE RANDOM: 141 mg/dL (ref 70–179)
SODIUM: 142 mmol/L (ref 135–145)

## 2024-03-07 LAB — MAGNESIUM: MAGNESIUM: 2.2 mg/dL (ref 1.6–2.6)

## 2024-03-07 LAB — PRO-BNP: PRO-BNP: 490 pg/mL — ABNORMAL HIGH (ref <=300.0)

## 2024-03-07 NOTE — Unmapped (Addendum)
 It was nice meeting you:  Someone will contact you to schedule a cardioversion and TEE  Continue Eliquis 5 mg twice a day, we will work on getting you the medication from the manufacturer  Referral placed for Cardiology: Dr. [place] Lee  Someone will reach out to you and send you information for sleep test (WatchPAT)  Please call  [facility] [place] has received a referral for you from. Please call the clinic at [phone]     My AFIB Treatment Plan    Stroke Prevention: AFIB causes a 5-fold increase in stroke risk.    Whether or not you need a blood thinner is based on your CHA2DS2-VASc score:     My CHADSVASC score is:   CHA2DS2-VASc: Hypertension (1), Age [>/= 75 years] (2), and Female [count only with other risk factors] (1)   Score (Adjusted Stroke Rate %/year): 4 (4%/year)    * Men with a score of 2 or higher and women with a score of 3 or higher should take a blood thinner to prevent stroke.    * Blood thinner prescribed: yes  Apixaban (Eliquis)    Heart Rate Control: AFIB can cause fast heart rates. Medications can be taken to slow the heart rate.    My rate controlling medication is:  carvedilol     Heart Rhythm Control:  AFIB can cause symptoms (such as tiredness, shortness of breath, pounding in chest). Antiarrhythmic medications or procedures (cardioversion, ablation) can be used to put the heart rhythm back to normal.    My heart rhythm control treatment is: cardioversion    What to do if you have an AF episode:  *Check heartrate/pulse.  * If heart rate is more than 100 beats in 1 minute, you may take an extra dose of the   rate controlling medicine (e.g. metoprolol, diltiazem) as directed by your provider.  *If you feel fine and heart rate is less than 100 beats in 1 minute -  make a note of how long the episode lasts, try relaxation exercises.    *Call the doctor's office if:  -- Heart rate is consistently more than 120 beats in 1 minute  -- AF episode lasts more than 24 hours  -- Symptoms get worse or you start feeling worse.     *Go to the emergency room if:   -- Symptoms get worse/severe: chest pain, trouble breathing, feeling like you may pass out.  -- Signs of a stroke (trouble with speech, movement, or vision)     Risk Factors and Lifestyle Modification:  Some conditions can worsen AFIB. Controlling these risk factors can improve AFIB and may prevent future AFIB episodes.    My risk factors for AFIB are:  Risk Factors: Hypertension, BMI (36), Obstructive sleep apnea, and HFpEF (55%)  **Please review these sections in the Living with AF Book for more information on how these conditions are linked with AF, and to learn what you can do to reduce your risk of AFIB.    Full electronic (PDF) version of Living With AF Patient Guide available on the [facility] website under Patient Education & Resources:  [email or web address]     .You have been referred to complete a home sleep study to check for sleep apnea.  There is a strong link between sleep apnea, AFib, high blood pressure, and other heart problems.  With sleep apnea, breathing stops and starts multiple times during sleep. This leads to drops in oxygen levels and can put  stress on the heart.   Untreated sleep apnea can trigger AFib episodes and can lower the success of your AFib treatment.  Sleep apnea is also linked to high blood pressure, stroke, and heart disease. It is very important to be tested for sleep apnea if you have symptoms or risk factors.    STEP 1. Download the WatchPAT One app on a smart phone or smart device.        STEP 2.   After insurance verification, a WatchPAT ONE home sleep apnea test kit will be mailed to your home or residence. You will receive a text message or phone call when the device ships with tracking information. The kit will arrive via [employer]PS. PLEASE ALLOW 1-2 WEEKS to receive the sleep study kit.    STEP 3.  [facility] will contact you via text message and phone calls to provide WatchPAT ONE instructions, PIN # (Last 4 digits of your mobile phone number) and contact information. Please expect a text or call from the [facility] Monitoring Center.    STEP 4.  Once the device has been delivered, PLEASE complete the test that night.  For assistance on how to use the device, please follow the instructions included in the kit.   View the the video link below for step by step instructions. For assistance, call the [employer] patient help desk available 24/7 at [phone].  *For billing questions (Copays and deductibles) contact your insurance provider or [facility] at the number above*     STEP 5.  Once completed, the results of your home sleep apnea test will be finalized within approximately one week.   You will receive a message with the results and recommendations in your MyChart account.   If you do not have a MyChart account, please set up your MyChart account. This is how we communicate results to you.    How to Use the WatchPAT ONE-Step by Step Video    Website:  [email or web address]        Frequently asked questions:  Who can I contact if I have questions about the device or results?  -Call the clinic at [phone] and ask for Rock, the EP nurse coordinator  When will the results be available?  -Once you complete the sleep study, the results will be finalized in approximately 1 week.  How will I receive results?  -You will be notified of your results through your MyChart portal.  What is my PIN number?  -The last four numbers of your phone number is your PIN number.  If I am diagnosed with sleep apnea, what happens next?  For moderate to severe sleep apnea, we will recommend a referral to a sleep medicine provider to review your results  in detail and to determine the best treatment plan.

## 2024-03-08 MED ORDER — FUROSEMIDE 40 MG TABLET
ORAL_TABLET | Freq: Every day | ORAL | 11 refills | 30.00000 days | PRN
Start: 2024-03-08 — End: 2025-03-08

## 2024-03-08 MED ORDER — SPIRONOLACTONE 25 MG TABLET
ORAL_TABLET | Freq: Every day | ORAL | 1 refills | 90.00000 days | Status: CP
Start: 2024-03-08 — End: ?

## 2024-03-08 NOTE — Unmapped (Signed)
 Copied from CRM #[id number]. Topic: Scheduling - New Appointment  >> Mar 08, 2024 11:18 AM Landry HERO wrote:  Caller asked to schedule a new appointment      Additional Requests/Needs: Yes Appointment Related: Hospital Admission appointment the first available is > 14 days out. They have been scheduled for the first available appointment.  The patient preferred contact: Home Phone There are no phone numbers on file. Urgent callback turnaround time: within 24 business hours. Programmer, systems Notified)

## 2024-03-09 NOTE — Unmapped (Addendum)
 Per Anesthesia's guidelines:    Please take the following medications the morning of your procedure with water :      Anastrazole  Carvedilol   Tylenol  ok if needed    Pt has continued Eliquis-Notified Burnard and Deanna-numbers given to pt's daughter.

## 2024-03-10 ENCOUNTER — Ambulatory Visit: Admit: 2024-03-10 | Payer: Medicare (Managed Care)

## 2024-03-10 ENCOUNTER — Encounter
Admission: RE | Admit: 2024-03-10 | Discharge: 2024-05-18 | Disposition: A | Discharge status: Residential Facility | Payer: Medicare (Managed Care) | Attending: Student in an Organized Health Care Education/Training Program

## 2024-03-10 ENCOUNTER — Encounter: Admit: 2024-03-10 | Payer: Medicare (Managed Care) | Attending: Internal Medicine

## 2024-03-10 ENCOUNTER — Encounter
Admit: 2024-03-10 | Payer: Medicare (Managed Care) | Attending: Student in an Organized Health Care Education/Training Program

## 2024-03-10 ENCOUNTER — Encounter: Admit: 2024-03-10 | Payer: Medicare (Managed Care)

## 2024-03-10 ENCOUNTER — Inpatient Hospital Stay: Admit: 2024-03-10 | Discharge: 2024-03-10 | Payer: Medicare (Managed Care)

## 2024-03-10 ENCOUNTER — Inpatient Hospital Stay
Admission: RE | Admit: 2024-03-10 | Discharge: 2024-05-18 | Disposition: A | Discharge status: Residential Facility | Payer: Medicare (Managed Care)

## 2024-03-10 ENCOUNTER — Encounter: Admit: 2024-03-10 | Payer: Medicare (Managed Care) | Attending: Anesthesiology

## 2024-03-10 ENCOUNTER — Encounter: Admit: 2024-03-10 | Payer: Medicare (Managed Care) | Attending: Acute Care

## 2024-03-10 ENCOUNTER — Encounter: Admit: 2024-03-10 | Payer: Medicare (Managed Care) | Attending: Physician Assistant

## 2024-03-10 LAB — URINALYSIS WITH MICROSCOPY
BACTERIA: NONE SEEN /HPF
BILIRUBIN UA: NEGATIVE
GLUCOSE UA: NEGATIVE
HYALINE CASTS: 41 /LPF — ABNORMAL HIGH (ref 0–1)
KETONES UA: NEGATIVE
LEUKOCYTE ESTERASE UA: NEGATIVE
NITRITE UA: NEGATIVE
PH UA: 6.5 (ref 5.0–9.0)
PROTEIN UA: NEGATIVE
RBC UA: <1 /HPF (ref <=4)
SPECIFIC GRAVITY UA: 1.009 (ref 1.003–1.030)
SQUAMOUS EPITHELIAL: <1 /HPF (ref 0–5)
UROBILINOGEN UA: <2
WBC UA: 1 /HPF (ref 0–5)

## 2024-03-10 LAB — COMPREHENSIVE METABOLIC PANEL
ALBUMIN: 2.4 g/dL — ABNORMAL LOW (ref 3.4–5.0)
ALKALINE PHOSPHATASE: 134 U/L — ABNORMAL HIGH (ref 46–116)
ALT (SGPT): 27 U/L (ref 10–49)
ANION GAP: 9 mmol/L (ref 5–14)
AST (SGOT): 37 U/L — ABNORMAL HIGH (ref <=34)
BILIRUBIN TOTAL: 1.6 mg/dL — ABNORMAL HIGH (ref 0.3–1.2)
BLOOD UREA NITROGEN: 7 mg/dL — ABNORMAL LOW (ref 9–23)
BUN / CREAT RATIO: 13
CALCIUM: 9.7 mg/dL (ref 8.7–10.4)
CHLORIDE: 102 mmol/L (ref 98–107)
CO2: 34.7 mmol/L — ABNORMAL HIGH (ref 20.0–31.0)
CREATININE: 0.52 mg/dL — ABNORMAL LOW (ref 0.55–1.02)
EGFR CKD-EPI (2021) FEMALE: >90 mL/min/1.73m2 (ref >=60)
GLUCOSE RANDOM: 109 mg/dL (ref 70–179)
POTASSIUM: 4.5 mmol/L (ref 3.4–4.8)
PROTEIN TOTAL: 5.8 g/dL (ref 5.7–8.2)
SODIUM: 146 mmol/L — ABNORMAL HIGH (ref 135–145)

## 2024-03-10 LAB — BLOOD GAS, ARTERIAL
BASE EXCESS ARTERIAL: 13.2 — ABNORMAL HIGH (ref -2.0–2.0)
BASE EXCESS ARTERIAL: 12.1 — ABNORMAL HIGH (ref -2.0–2.0)
BASE EXCESS ARTERIAL: 14.6 — ABNORMAL HIGH (ref -2.0–2.0)
BASE EXCESS ARTERIAL: 13.5 — ABNORMAL HIGH (ref -2.0–2.0)
FIO2 ARTERIAL: 28
FIO2 ARTERIAL: 28
HCO3 ARTERIAL: 33 mmol/L — ABNORMAL HIGH (ref 22–27)
HCO3 ARTERIAL: 32 mmol/L — ABNORMAL HIGH (ref 22–27)
HCO3 ARTERIAL: 34 mmol/L — ABNORMAL HIGH (ref 22–27)
HCO3 ARTERIAL: 34 mmol/L — ABNORMAL HIGH (ref 22–27)
O2 SATURATION ARTERIAL: 89.6 % — ABNORMAL LOW (ref 94.0–100.0)
O2 SATURATION ARTERIAL: 92.6 % — ABNORMAL LOW (ref 94.0–100.0)
O2 SATURATION ARTERIAL: 95 % (ref 94.0–100.0)
O2 SATURATION ARTERIAL: 97.7 % (ref 94.0–100.0)
PCO2 ARTERIAL: 86 mmHg (ref 35.0–45.0)
PCO2 ARTERIAL: 82.9 mmHg (ref 35.0–45.0)
PCO2 ARTERIAL: 84.7 mmHg (ref 35.0–45.0)
PCO2 ARTERIAL: 71.1 mmHg (ref 35.0–45.0)
PH ARTERIAL: 7.28 — ABNORMAL LOW (ref 7.35–7.45)
PH ARTERIAL: 7.28 — ABNORMAL LOW (ref 7.35–7.45)
PH ARTERIAL: 7.29 — ABNORMAL LOW (ref 7.35–7.45)
PH ARTERIAL: 7.35 (ref 7.35–7.45)
PO2 ARTERIAL: 68.9 mmHg — ABNORMAL LOW (ref 80.0–110.0)
PO2 ARTERIAL: 77.7 mmHg — ABNORMAL LOW (ref 80.0–110.0)
PO2 ARTERIAL: 85.5 mmHg (ref 80.0–110.0)
PO2 ARTERIAL: 120 mmHg — ABNORMAL HIGH (ref 80.0–110.0)

## 2024-03-10 LAB — CBC
HEMATOCRIT: 43.8 % (ref 34.0–44.0)
HEMOGLOBIN: 14.7 g/dL (ref 11.3–14.9)
MEAN CORPUSCULAR HEMOGLOBIN CONC: 33.6 g/dL (ref 32.0–36.0)
MEAN CORPUSCULAR HEMOGLOBIN: 32.7 pg — ABNORMAL HIGH (ref 25.9–32.4)
MEAN CORPUSCULAR VOLUME: 97.4 fL — ABNORMAL HIGH (ref 77.6–95.7)
MEAN PLATELET VOLUME: 8.3 fL (ref 6.8–10.7)
PLATELET COUNT: 88 10*9/L — ABNORMAL LOW (ref 150–450)
RED BLOOD CELL COUNT: 4.5 10*12/L (ref 3.95–5.13)
RED CELL DISTRIBUTION WIDTH: 15.9 % — ABNORMAL HIGH (ref 12.2–15.2)
WBC ADJUSTED: 4.1 10*9/L (ref 3.6–11.2)

## 2024-03-10 LAB — AMMONIA: AMMONIA: 192 umol/L — ABNORMAL HIGH (ref 11–32)

## 2024-03-10 LAB — MAGNESIUM: MAGNESIUM: 2.2 mg/dL (ref 1.6–2.6)

## 2024-03-10 LAB — PROTIME-INR
INR: 1.8
PROTIME: 20.5 s — ABNORMAL HIGH (ref 9.9–12.6)

## 2024-03-10 LAB — PRO-BNP: PRO-BNP: 520 pg/mL — ABNORMAL HIGH (ref <=300.0)

## 2024-03-10 LAB — T4, FREE: FREE T4: 1.19 ng/dL (ref 0.89–1.76)

## 2024-03-10 LAB — TSH: THYROID STIMULATING HORMONE: 6.212 u[IU]/mL — ABNORMAL HIGH (ref 0.550–4.780)

## 2024-03-10 MED ADMIN — ondansetron (ZOFRAN) injection: INTRAVENOUS | @ 13:00:00 | Stop: 2024-03-10

## 2024-03-10 MED ADMIN — lidocaine (PF) (XYLOCAINE-MPF) injection: SUBCUTANEOUS | @ 13:00:00 | Stop: 2024-03-10

## 2024-03-10 MED ADMIN — lactated Ringers infusion: INTRAVENOUS | @ 13:00:00 | Stop: 2024-03-10

## 2024-03-10 MED ADMIN — Propofol (DIPRIVAN) injection: INTRAVENOUS | @ 13:00:00 | Stop: 2024-03-10

## 2024-03-10 MED ADMIN — furosemide (LASIX) injection 40 mg: 40 mg | INTRAVENOUS | @ 19:00:00 | Stop: 2024-03-10

## 2024-03-10 NOTE — Unmapped (Signed)
 PULMONARY CONSULT  NOTE      Patient: [name](1946-01-29)  Reason for consultation: [name] is a 78 y.o. female who is seen in consultation at the request of [name] JAYSON Deed, MD for comprehensive evaluation of acute on chronic hypercarbic respiratory failure.    Assessment and Recommendations:      Principal Problem:    Atrial fibrillation    (CMS-HCC)  Active Problems:    Alcoholic liver disease (HHS-HCC)    HTN (hypertension)    Class 2 severe obesity with serious comorbidity in adult    A-fib (CMS-HCC)    [name] is a 78 y.o. female with PMH HTN, breast cancer s/p partial mastectomy on anastrozole , compensated NASH cirrhosis, and recent admission for with acute HFpEF in the setting of new onset atrial fibrillation (98/29-10/3) who was admitted for planned DC cardioversion but has developed acute on chronic hypercarbic respiratory failure and altered mental status.    The etiology of the acute on chronic hypercarbic respiratory failure is not entirely clear at this point but I suspect it is largely related to decreased gas exchange due to volume overload. She does not appear to have gotten any other sedating medications aside from propofol  which should be quickly reversible so unlikely to cause this prolonged respiratory failure. Potential compounding factors for her mental status could be decompensation of her liver failure or stroke though she has appropriately been on anticoagulation for her afib prior to the planned cardioversion so this is less likely. She has no history of obstructive lung disease so this is unlikely to be contributing.    Recommendations:  - Portable chest xray to evaluate pulmonary edema and identify any structural abnormalities that would contribute to her hypercapnea  - Change BiPAP to AVAPS mode of NIPPV and repeat VBG in 30 minutes; if she doesn't respond with this, I would recommend increasing the tidal volume and respiratory rate  - Recommend continued aggressive diuresis, foley for close I/O monitoring  - Please check ammonia level, INR and RUQ ultrasound to better evaluate liver function  - Would recommend that she be made ICU status given her tenuous respiratory status  - I did discuss her code status with her daughter [name], though her other daughter is her HCDM; she feels that her mother would want to be intubated if there were a rapidly reversible cause of her respiratory failure but she would not want to be left on a ventilator for a long period of time     We appreciate the opportunity to assist in the care of this patient.  Please page [phone] with any questions.    Attestation     I personally provided critical care services to assess, manipulate, and/ or support vital system functions(s) to treat single or multiple vital organ system failure and/or to prevent further life threatening deterioration of the patient???s condition of acute on chronic hypercarbic respiratory failure. This included 50 minutes that I personally spent performing critical care services, excluding procedures.  [name] DELENA Saxon, MD      [name] DELENA Saxon, MD      Subjective:      History of Present Illness:  [name] is a 78 y.o. female with PMH HTN, breast cancer s/p partial mastectomy on anastrozole , compensated NASH cirrhosis, and recent admission for with acute HFpEF in the setting of new onset atrial fibrillation (98/29-10/3) who was admitted for planned DC cardioversion. Patient was reportedly somnolent per family members prior to presentation today. She was sedated  with propofol  infusion and underwent TEE which demonstrated a significant left atrial appendage thrombus and cardioversion was aborted. However, she was difficult to wake up following sedation. ABG at that time demonstrated a respiratory acidosis and patient was placed on BiPAP. Her tidal volumes were quite small on BiPAP (approximately 200 ml) and repeat ABG demonstrated minimal improvement so she was converted to AVAPS which she is on at present.     Per daughter [name] who was at bedside, patient has seemed to be more somnolent than usual during the daytime since before her recent admission. However, since discharge, she has been more confused - still verbal but not always clear on where she is or what is happening. [name] notes that she has also appeared more short of breath and she does hear her gasp and stop breathing sometimes when she sleeps.     Review of Systems: A comprehensive review of systems was performed and was negative except as above in HPI  Past Medical History[1]  Past Surgical History[2]  Medications reviewed in Epic  Allergies as of 03/08/2024 - Reviewed 03/08/2024   Allergen Reaction Noted    Sulfa (sulfonamide antibiotics) Anaphylaxis 06/03/2017    Sulfur  Anaphylaxis 02/28/2015    Aspirin  07/24/2017     Family History[3]  Social History     Tobacco Use    Smoking status: Never     Passive exposure: Past    Smokeless tobacco: Never   Substance Use Topics    Alcohol use: Not Currently        Objective:      Physical Exam:  Vitals:    03/10/24 1200 03/10/24 1230 03/10/24 1330 03/10/24 1500   BP: 102/52 114/49 125/75 126/60   Pulse: 65 70  72   Resp: 15 16 17 17    Temp:  36.7 ??C (98 ??F)     TempSrc:  Axillary     SpO2: 95% 90%     Weight:       Height:         General: Drowsy but arouses to loud voice, answers questions intermittently then drifts back to sleep  Eyes: Anicteric sclera, conjunctiva clear.  ENT:  Mucous membranes moist and intact.  Lungs: Difficult to auscultate due to NIPPV but appears to have scattered coarse breath sounds bilaterally  Cardiovascular: Irregularly irregular rhythm.  Abdomen: Soft, non-tender, not distended  Musculoskeletal: No clubbing and no synovitis. Trace LE edema bilaterally.  Neuro: No focal neurological deficits.    Malnutrition Assessment by RD:          Diagnostic Review:   All labs and images were personally reviewed.         [1]   Past Medical History:  Diagnosis Date    A-fib (CMS-HCC)     Alcoholism    (CMS-HCC)     Alcoholism /alcohol abuse     Cirrhosis    (CMS-HCC)     Depression     Hypertension     Liver disease     Malignant neoplasm of overlapping sites of right breast in female, estrogen receptor positive    (CMS-HCC) 10/03/2020   [2]   Past Surgical History:  Procedure Laterality Date    BREAST BIOPSY Right     benign-a long time ago    BREAST BIOPSY Right 07/2020    malignant    BREAST LUMPECTOMY Right     4 2022    CHOLECYSTECTOMY      PR BX/REMV,LYMPH NODE,DEEP AXILL Right 09/03/2020  Procedure: BX/EXC LYMPH NODE; OPEN, DEEP AXILRY NODE;  Surgeon: Alm Elsie Como, MD;  Location: [facility];  Service: Surgical Oncology Breast    PR INTRAOPERATIVE SENTINEL LYMPH NODE ID W DYE INJECTION Right 09/03/2020    Procedure: INTRAOPERATIVE IDENTIFICATION SENTINEL LYMPH NODE(S) INCLUDE INJECTION NON-RADIOACTIVE DYE, WHEN PERFORMED;  Surgeon: Alm Elsie Como, MD;  Location: [facility];  Service: Surgical Oncology Breast    PR MASTECTOMY, PARTIAL Right 09/03/2020    Procedure: MASTECTOMY, PARTIAL (EG, LUMPECTOMY, TYLECTOMY, QUADRANTECTOMY, SEGMENTECTOMY);  Surgeon: Alm Elsie Como, MD;  Location: [facility] OR [facility];  Service: Surgical Oncology Breast    PR UPPER GI ENDOSCOPY,BIOPSY N/A 02/03/2018    Procedure: UGI ENDOSCOPY; WITH BIOPSY, SINGLE OR MULTIPLE;  Surgeon: Eleanor Dewey Sorrel, MD;  [facility]: [facility];  Service: Gastroenterology    RADIATION Right     unsure finish date    TUBAL LIGATION     [3]   Family History  Problem Relation Age of Onset    Heart disease Mother     No Known Problems Father     No Known Problems Sister     No Known Problems Daughter     No Known Problems Maternal Grandmother     No Known Problems Maternal Grandfather     No Known Problems Paternal Grandmother     No Known Problems Paternal Grandfather     Lupus Brother     No Known Problems Other     BRCA 1/2 Neg Hx     Breast cancer Neg Hx     Cancer Neg Hx Colon cancer Neg Hx     Endometrial cancer Neg Hx     Ovarian cancer Neg Hx     Mental illness Neg Hx     Substance Abuse Disorder Neg Hx

## 2024-03-10 NOTE — Unmapped (Signed)
 [name] presents today for DC cardioversion. She was consented and prepared in the usual manner. Echocardiogram showed significant left atrial appendage thrombus and cardioversion was abandoned. The patient was sedated by the anesthesia staff with deep sedation.  There were no complications with the procedure    I was present throughout the entirety of the procedure    The patient will follow-up with Dr. [place] Lee in 4-6 weeks.    She will be rescheduled for cardioversion after completing 30 days of anticoagulation without interruption.    Signed electronically by: Velma GORMAN Bevels, MD   March 10, 2024 9:35 AM

## 2024-03-10 NOTE — Hospital Course (Addendum)
 To Do:  [ ]  Patient has had NGT for 6 weeks, please place PEG ASAP  [ ]  Attempted diuresis s/p lasix  40 on 12/16, please consider lasix  80 IV   [ ]  Have been titrating free water  flushes down as hypernatremia seems resolved, continue to down titrate as needed  [ ]  Family would like facility to know that it takes a long time for anesthesia to wear off after procedures for patient      Outpatient Provider Follow-up:  [ ]  Repeat TEE to assess for LAA thrombus resolution  [ ]  Colonoscopy for incidental finding concerning for sigmoid [name] malignancy  [ ]  Referral for [facility] clinic, Dr. Joycelyn messaged: re assess the risk of recurrent seizures and evaluate the need of continuing or not with anti-seizure medication   [facility]  ATTN: [facility]  Fax: [phone]  Phone: [phone]  (Option 2)   Email: [email or web address]  [ ]  Please order a 70 min long EEG to be done before the clinic visit (within three months after discharge)           Brief Hospital Course:   [name] is a 78 y.o. female with HFpEF, HTN, Afib on AC, MASLD cirrhosis, originally hospitalized for scheduled TEE/DCCV (aborted d/t LAA clot) c/b AHHRF, encephalopathy and septic shock requiring MICU care, now s/p extubation transferred from [facility] for further management of anticoagulation and hypernatremia. Transferred to [facility] MICU for closer monitoring for respiratory status, pressor requirement, GI bleed, and management of carotid artery injury.  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  [name]' hospitalization has been complicated by numerous extubations and reintubation's.  Currently on intubation #4.  Now s/p tracheostomy on 04/15/2024. Ongoing GOC discussion, last conversation on 12/9, daughter would like to pursue all measures.    Important dates  10/9   Admitted for somnolence and difficulty rousing during TEE for Direct Current Cardioversion for Afib.   10/10-10/12   Intubated for airway protection due to further increased somnolence.  After the intubation, she began experiencing bright red blood per rectum and was thus transferred to main campus MICU due to c/f GI bleed.  Required pressor support and antibiotics due to GNR bacteremia.   10/13  More responsive, Extubated   10/14-10/23  Transferred out of ICU, back to [facility]. Was improving and planning to go to [facility]  10/24  Acute decompensation with altered mental status and hypercarbic respiratory failure requiring intubation. followed by cardiac arrest with ROSC achieved.    Imaging done after cardiac arrest revealed extravasation into the gastric fundus possibly secondary to traumatic ET tube placement.  Transferred to [facility] campus for GI evaluation.  Central line was placed shortly after ROSC was achieved.  This was complicated by an carotid/venous fistula prompting transfer to [facility] campus for vascular surgery.  10/25  GI scoped her and clipped an area of active bleeding, also removed a total of 1.5 L of blood.  10/26  Vascular surgery stated that fistula has healed on ultrasound  10/27  Continued poor mentation, speculate it is in the setting of missing lactulose  doses precipitating hepatic encephalopathy.  Treating with lactulose  and awaiting mentation improvement before extubation.  10/31-11/1  Mentation improved, extubated 10/31. Made stepdown status   11/2  Reintubated due to increasing lethargy and inability to clear secretions.  Bronchoscopy was done after chest x-ray revealed complete  opacification of the left lung.  11/5   Self extubated.  Conversation held with daughter regarding next steps that should we need to intubate again.  Daughter understands that next intubation would likely lead to an early tracheostomy.  11/6  Reintubated due to hypercarbia and inability to protect airway  11/14  Trach placed c/b anaphylactic reaction to rocuronium    11/29  Transferred out of MICU for ongoing vent wean, supportive care  12/4  GOC discussion, want to continue to give her time to recover.   12/7  Rapid response for hypotension, transferred to the MICU for pressor support.  12/9  Off pressors. Changed level of care to stepdown status.    Hospital Course By Problem:  PEA arrest s/p ROSC  Patient with PEA arrest peri-intubation with ROSC. Shock thought to be distributive versus hemorrhagic in the setting of GI bleed History of atrial fibrillation with known LAA clot and HFpEF. Vasopressors were weaned off 10/27.    Altered mental status, multifactorial  Hypercarbia  Hepatic Encephalopathy  Non-convulsive seizures  Multiple drivers throughout hospitalization including hypercarbia, hepatic encephalopathy, septic shock, ICU delirium and seizures. Mental status waxed and waned during hospitalization and she intermittently did seem able to express herself and follow instructions. Despite controlling all of the potential  causes, her mental status was very slow to improve. Treated with Keppra , Lacosamide , and Lactulose . On 12/5 she was re-evaluated by neurology after repeat MRI showed improvement in areas of cortical restriction. However without clinical improvement, it is hard to say to what degree she will improve if at all. Per discussion with her daughter, she too months to recover from an episode of HE previously, so she would like to give her time     Carotid Artery Injury (resolved)  At [facility], CVC placed intended for RIJ on 10/24 following intubation, PEA, ROSC. Was used with known blood return. Unclear if it was used for pressor support, but highly likely that it was. CXR for placement check showed concern for intra aterial location, and blood gas confirmed it was arterial. Thought to be in carotid artery. CVC removed with pressure held for 15 minutes, with no hematoma formation. On arrival to [facility], bedside ultrasound showed no large hematoma. No active bleeding.  As of 10/26, repeat carotid ultrasound showed spontaneous resolution of the fistula per vascular.  Vascular stated that they would not be any intervention required from their standpoint.    Upper GIB 2/2 traumatic enteric tube placement  Sigmoid Mass c/f Malignancy and Hematochezia  CT abdomen pelvis done 10/24 with evidence of cirrhosis and masslike thickening of the lower sigmoid [name] concerning for malignancy.  CTA abdomen pelvis performed 10/24 with active extravasation into the gastric fundus possibly secondary to traumatic enteric tube placement.  GI scoped the patient on 10/25 and clipped an area of active bleeding. Received supportive transfusions. Should have outpatient colonoscopy if in line with GOC    Acute Hypoxic Hypercarbic Respiratory Failure   Difficulty weaning off vent   Multiple instances of respiratory decompensation. Most recently, RRT called on 10/24 for increased somnolence, found to have acute hypercarbic respiratory failure and subsequently intubated and extubated multiple times. Eventually receiving trach 11/14.     VAP 2/2 PsA and MSSA  Cellulitis  Recurrent Sepsis  Treated for VAP and was treated for Cellulitis     Shock, hemorrhagic versus septic  Recurrent episodes of shock both GI and sepsis as above    Anasarca:  In setting of cirrhosis, volume resuscitation 2/2  shock, and poor nutrition. Difficulty diuresing 2/2 hypotension. Patient received IV lasix  40 on 12/16, please consider increasing to IV lasix  80 at facility.     Atrial Fibrillation, LA appendage thrombus   Atrial fibrillation discovered at previous admission that is rate controlled on chronic carvedilol .  She presented 03/10/24 for TEE/DCCV. TEE showed LA thrombus with DCCV cancelled. Plan is for pt to follow-up with Dr. Chad LEE in 4-6 weeks. She will be rescheduled for DCCV after 30 days anticoagulation without interruption.    Decompensated MetALD cirrhosis   Volume: anasarca as above, multiple POCUS evals without ascites  Infection: no e/o SBP, other infections treated as above  Bleeding: had MWT and traumatic NGT insertion earlier in course, continue PPI BID, no e/o varices  Encephalopathy: Rifaxamin and Lactulose  to goal of stool output via FM    Chronic Problems     Vertebral Compression Fracture  T12 vertebral compression fracture with retropulsion into the ventral spinal canal on CT. Has known history of osteoporosis identified on Dexa scan of femoral neck (11/2022 T score -2.9).       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)  Off anastrozole  1 mg daily while critically ill, continued at discharge

## 2024-03-10 NOTE — Unmapped (Addendum)
 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 She was difficult to arouse post-procedure and was admitted to Cardiology service for further workup, which was notable for acute on chronic hypercarbic respiratory failure, presumably due to volume overload. Case discussed with intensivist, Dr. Avel.     Acute toxic metabolic encephalopathy - Acute hepatic encephalopathy  Has been disoriented, intermittently agitated, protecting airway. Likely multifactorial, due to AoC hypercarbic respiratory failure and acute hepatic encephalopathy (no longer on lactulose  at baseline). Acute respiratory acidosis improving on NIPPV with adjustment of settings (BiPAP-->AVAPS with increased goal TV 600 mL). Serum ammonia significantly elevated to 192, likely contributing. No evidence of GI bleed or obvious infection as trigger for acute HE (UA negative, CXR without consolidation, normal WBC).   - Liver duplex US  ordered  - Continue AVAPS as tolerated  - Place rectal tube and start lactulose  enema q6h x 4, change to PO as mental status and blood gases improve and allow for discontinuation of NIPPV  - Avoid deliriogenic medications  - Would obtain CT head once more stable     Acute on chronic hypercarbic respiratory failure  CXR personally reviewed and does not show significant pulmonary edema or new consolidations. Suspect AoC hypercarbia may be being driven by acute HE.   - Continue AVAPS as tolerated  - s/p Lasix 40 mg IV x 1 at 1500, can consider repeating overnight if blood gases don't continue to improve with lactulose   - Updated level of care to ICU status per discussion with Dr. Avel given tenuous respiratory status

## 2024-03-10 NOTE — Unmapped (Signed)
 Patient is on Continous BIPAP. ABG not improving. Therefore placed on AVAPS mode

## 2024-03-10 NOTE — Unmapped (Signed)
 Shift Summary  Furosemide was administered in the afternoon to address fluid status.  Arterial blood gases and urinalysis were performed, revealing abnormal respiratory and metabolic parameters and some abnormal urine findings.  Frequent repositioning, pressure reduction, and skin protection interventions were implemented to support skin health.  Safety and fall prevention measures were maintained throughout the shift, with no falls or injuries documented.  Overall, interventions focused on respiratory support, skin integrity, and safety, with ongoing monitoring and assistance for activities of daily living.    Skin Health and Integrity: Bruising and redness were present throughout the shift, but frequent repositioning, absorbent pads, cleansing with dimethicone wipes, and pressure reduction devices were consistently utilized to support skin integrity.    Absence of Hospital-Acquired Illness or Injury: No new hospital-acquired injuries were documented, and safety interventions such as aspiration and bleeding precautions, fall reduction programs, and alarms were maintained during the shift.    Improved Ability to Complete Activities of Daily Living: Assistance was required for bathing during the shift, and mobility remained slightly limited with weak hand grip noted later in the day.    Effective Breathing Pattern: Respiratory rate and SpO2 fluctuated, with periods of abnormal values and shallow breathing observed; incentive spirometry showed minimal volume and arterial blood gases reflected low oxygen saturation and high CO2 levels.    Absence of Fall and Fall-Related Injury: Fall reduction strategies were maintained, including hourly visual checks, bed in lowest position, call light within reach, and alarms activated, with no falls reported during the shift.

## 2024-03-10 NOTE — Unmapped (Signed)
 Internal Medicine (MEDM) History & Physical    Assessment & Plan:   [name] is a 78 y.o. female who is presenting to [facility] with Atrial fibrillation    (CMS-HCC), in the setting of the following pertinent/contributing co-morbidities: HFpEF, HTN, Cirrhosis, Breast Cancer s/p Partial Mastectomy/Radiation .    Principal Problem:    Atrial fibrillation    (CMS-HCC)  Active Problems:    Alcoholic liver disease (HHS-HCC)    HTN (hypertension)    Class 2 severe obesity with serious comorbidity in adult    A-fib (CMS-HCC)      Active Problems    #Acute Toxic Metabolic Encephalopathy  #Acute Hepatic Encephalopathy  #S/P Procedural Sedation  P/W 1W worsening fatigue, somnolence, confusion ISO discharge for HFpEF exacerbation: presented for scheduled TEE/DCCV with Cardiology, and received 50 mg Propofol , found to be difficult to wake following sedation. Course C/B hypercarbic respiratory failure as below. Poorly responsive on exam with prior providers and was started on continuous NIPPV: on my evaluation, reportedly more interactive, looking around and moving upper extremities. Suspect primary driver is underlying hepatic encephalopathy given progressive nature prior to current presentation; consider if Propofol  sedation as acute stressor exacerbated AMS, which in turn induced hypercarbia that worsened mental status further. Finally, consider infection or liver failure as etiologies, though without leukocytosis and HDS despite hypothermia. Given that she is improving with current interventions, favor proceeding with course and will continue to monitor neuro status.  - NIPPV + Lactulose  + workup as below.  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 CXR with slight pulmonary edema, diuresed 1x w/ IV Lasix 40 mg. In interim, ABGs have reassuringly been improving w/ continuous NIPPV. Suspect encephalopathy above w/ acute procedural sedation set off decreased respiratory drive causing respiratory failure; consider also fluid overload (though pro-BNP stable/improved and minimal CXR pulmonary edema). For now, continue continuous NIPPV overnight, consider repeat diuresis.  - Pulm/CCU consulted: appreciate recs   - Continue AVAPs overnight   - Trend Blood Gas overnight - hypercarbia improving  - Recommend sleep study as outpatient: per daughter, has had gasping/stopped breathing when sleeping at home    #Hypothermia  Afebrile upon arrival to scheduled procedure via axillary temp, found to be hypothermic via foley catheter upon transfer to CCU to 34.1C, improved with Bair Hugger. Otherwise normotensive and non-tachycardic, encephalopathic as above. Broad DDX, considered infection/sepsis (though no localizing symptoms prior to TEE, no leukocytosis) from SBP (though no ascites on POCUS), thyroid dysfunction (TSH WNL); possible due to propofol  (though has been several hours since administration); consider poor thermoregulation iso decompensated cirrhosis. For now, plan to workup broadly, continue Bair Hugger, discontinue as able as hepatic encephalopathy improves.  Facilities manager in place, wean as able; Temp monitoring via Foley  - Workup  - BCX x2 Pending; U/A + CXR Unremarkable  - TSH elevated, T4 WNL  - CBC/CMP repeat pending; INR pending  - Defer Abx while HDS without leukocytosis (low c/f SBP)    #Acute Decompensated MAFLD Cirrhosis  #Hepatic Encephalopathy - Thrombocytopenia  Previously followed  w/ Hepatology, lost to follow up in 2020. Had not been on Lactulose  for several years. Since last admission, worsening fatigue w/ somnolence, disorientation (unclear of year) per daughter. On exam, non-participatory to conversation or exam, however opening eyes and moving extremities. Ammonia elevated on labs. Suspect underlying HE is contributing to presentation, especially while not on lactulose : BM since being on lactulose  enema is without blood/melena, suggesting against UGIB as etiology; no leukocytosis/ascites on POCUS to suggest SBP as etiology. Will continue lactulose  enema while poor mental status, obtain Liver Doppler to r/o clot as etiology as well.  - Lactulose : continue Enema q6h x4, goal 4-5x BM/day   - Transition to PO when able as mental status improves  - Complications:   - HE: Grade 2-3 prior to admission   - EV: G1EV (2019) - would benefit from EGD outpt   - Ascites: Not present on bedside POCUS   - SBP: Low concern (no WBC bump, HDS)  - Liver Doppler U/S Pending: NPO for imaging  - Continue Torsemide PRN for HFpEF/Cirrhosis  - Establish care with outpatient GI clinic  - Nutrition consult  - Daily MELD Labs    MELD 3.0: 17 at 03/10/2024  9:23 PM  MELD-Na: 15 at 03/10/2024  9:23 PM  Calculated from:  Serum Creatinine: 0.52 mg/dL (Using min of 1 mg/dL) at 89/0/7974 87:51 PM  Serum Sodium: 146 mmol/L (Using max of 137 mmol/L) at 03/10/2024 12:48 PM  Total Bilirubin: 1.6 mg/dL at 89/0/7974 87:51 PM  Serum Albumin: 2.4 g/dL at 89/0/7974 87:51 PM  INR(ratio): 1.8 at 03/10/2024  9:23 PM  Age at listing (hypothetical): 78 years  Sex: Female at 03/10/2024  9:23 PM    #Chronic HFpEF (Dry Weight 195 lb) - HTN  Discharged 03/04/24 after several weeks worsening BLE edema, orthopnea and dyspnea, w/ Pro-BNP 850 and HFpEF on TTE. Established care w/ Cardiology since discharge, currently on diuretic PRN, MRA, Coreg . Pro-BNP improved this admission from prior. Continue outpatient regimen.  - Continue Torsemide PRN for weight gain  - GDMT:   - SGLT2i: Farxiga 47$/month - consider as outpatient   - MRA: Continue Aldactone 25 mg daily  - Continue Coreg  BID  - Daily BMP/Mg  - Daily Weights  - Intake/Output    #Atrial Fibrillation - Left Atrial Appendage Thrombus (03/10/24)  New onset (03/2024), continued on home Coreg , started on Eliquis (cost-prohibitive due to not meeting deductible). Attempted TEE DCCV however aborted due to thrombus noted as above.  - Continue Eliquis 5 mg BID  - Continue Coreg  BID    Chronic Problems    #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)  - Continued anastrozole  1 mg daily    #Vertebral Compression Fracture - Osteoporosis  T12 fracture w/ retropulsion into ventral spinal canal on prior CT. Continue Vit D, Ca, Alendronate  on discharge      The patient's presentation is complicated by the following clinically significant conditions requiring additional evaluation and treatment: - Malnutrition POA requiring further investigation, treatment, or monitoring  - Thrombocytopenia requiring further investigation or monitor  - Hypercoagulable state requiring additional attention to DVT prophylaxis and treatment or chronic anticoagulation secondary to Atrial Fibrillation   - Chronic heart failure POA requiring further investigation, treatment, or monitoring  - Age related debility POA requiring additional resources: DME, PT, or OT   Issues Impacting Complexity of Management:  -The patient is at high risk from Hospital immobility in an elderly patient given baseline poor  functional status with a high risk of causing delirium and further decline in function  -The patient is at high risk for the development of complications of volume overload due to the need to provide IV hydration for suspected hypovolemia in the setting of: heart failure and Cirrhosis  -Need for intensive oxygen therapy of BiPAP, which places the patient at high risk for oxygen toxicity  -The patient is at high risk of complications from Acute Hypercarbic respiratory failure      Medical Decision Making: Discussed the patient's management and/or test interpretation with Pulmonology and Cardiology as summarized within this note      Checklist:  Diet: NPO  DVT PPx: Patient Already on Full Anticoagulation with Eliquis  Code Status: Full Code  Dispo: Patient appropriate for Inpatient based on expectation of ongoing need for hospitalization greater than two midnights based on severity of presentation/services including Acute Hypercarbic Respiratory Failure, Toxic Metabolic Encephalopathy    Team Contact Information:   Primary Team: Internal Medicine (MEDM)  Primary Resident: Toribio Chessman, MD  Resident's Pager: [phone] [facility] Senior Resident)    Chief Concern:   Atrial fibrillation    (CMS-HCC)    Subjective:   [name] is a 78 y.o. female with pertinent PMHx of HFpEF, hx breast cancer s/p partial mastectomy on anastrozole , NASH cirrhosis, Afib presenting with altered mental status after procedure    History obtained by chart review 2/2 patient altered    HPI:    On chart review, patient was recently discharged on 03/04/24 after admission for several weeks worsening SOB/DOE/orthopnea/LE edema due to new HFpEF exacerbation 2/2 chronic HTN. That admission, had received IV Lasix with improvement of symptoms. Also was noted to have new onset Afib rate controlled on coreg : was started on Eliquis, planned for Afib transition clinic and outpatient TEE DCCV.    In the interim, course as follows:  03/07/24: Afib clinic - noted to be sleeping excessively since getting home. Lasix 40 mg Scheduled switched to PRN. Increased Aldactone to 25 mg daily. Was not taking Eliquis due to unaffordable cost (didn't yet meet deductible)  03/10/24: Scheduled TEE/DCCV - noted left atrial appendage thrombus, thus cancelled DCCV. Had received 30 mg of Propofol  total during procedure    Post procedure, patient was difficult to wake post sedation: ABG obtained at the time showed respiratory acidosis (pH 7.28 / pCO2 86) and so was placed on BiPAP and brought to CCU for closer monitoring.    Collateral from daughter by Cardiology team: she had been having excess sleeping at home, with symptoms of early satiety, not eating well and so was actually losing weight. Fatigue was significantly worse, and exacerbated after taking medications. Denied any chest pain, and breathing had improved from previous admissions. Had not missed any medications at home. LEE was improved from prior. Per pulm team w/ daughter Sari, patient was both more somnolent and more confused (verbal, but not clear where she is/what is happening).    Course as follows:  VS: 0700 Afeb, HR 71 Afib, BP 122/64, 99% RA  2100: 34.6C, HR 86, 114/90, 99% on BiPAP 40% FiO2  Labs:   CBC: WBC 4.1, Hg 14.7, PLT 88  BMP: Na 146, K 4.5, CO2 35, Cr 0.52, Glu 109  LFTs: Alb 2.4, Total Protein 5.8, Tbili 1.6, AST 37, ALT 27, Alk 134  Ammonia 192  Pro-BNP 520  INR 1.8  ABG 7.28 / 86 / 69 / 90 on 2L O2  S/P BiPAP pH 7.35 /  pCO2 71 / pO2 120 / HCO3 34 on 28%  U/A: Negative  Imaging: CXR: Mild interstitial edema w/ small bilateral pleural effusions    On my evaluation in CCU, patient was resting with mitts on hands, and BiPAP mask on AVAPs setting with RT support. She was non-participatory on exam and history taking due to altered mental status, but would open eyes, look around, and was reportedly more responsive on my evaluation as compared to prior upon initial transfer to CCU. Notably, Bair hugger in place for hypothermia noted via foley catheter.      Pertinent Surgical Hx  S/P Partial Mastectomy  TEE (03/10/24)    Pertinent Family Hx  N/A    Pertinent Social Hx   Lives at home with husband + daughter + daughter's nephew and family    Allergies  Sulfa (sulfonamide antibiotics), Sulfur , and Aspirin    I was NOT able to review the Medication List with the patient or a representative. Further medication reconciliation is needed.  Prior to Admission medications   Medication Dose, Route, Frequency   anastrozole  (ARIMIDEX ) 1 mg tablet 1 mg, Oral, Daily (standard)   apixaban (ELIQUIS) 5 mg Tab 5 mg, Oral, 2 times a day (standard)   carvedilol  (COREG ) 3.125 MG tablet 3.125 mg, Oral, 2 times a day (standard)   folic acid  (FOLVITE ) 1 MG tablet 1 mg, Daily (standard)   furosemide (LASIX) 40 MG tablet 40 mg, Oral, Daily PRN   magnesium  oxide (MAG-OX) 400 mg (241.3 mg elemental magnesium ) tablet 400 mg, Oral, 2 times a day (standard)   potassium chloride  20 MEQ ER tablet 20 mEq, Oral, 2 times a day (standard)   spironolactone (ALDACTONE) 25 MG tablet 25 mg, Oral, Daily (standard)   thiamine  (B-1) 100 MG tablet 100 mg, Daily (standard)   vitamin E-268 mg, 400 UNIT, 268 mg (400 UNIT) capsule 268 mg, Daily (standard)   acetaminophen  (TYLENOL ) 325 MG tablet 650 mg, Oral, Every 6 hours PRN   [Paused] alendronate  (FOSAMAX ) 70 MG tablet 70 mg, Oral, Every 7 days  Patient not taking: Reported on 03/07/2024  Paused since Fri 03/04/2024 until manually resumed   calcium carbonate (OS-CAL) 1,250 mg (500 mg elem calcium) tablet 1 tablet, Daily (standard)  Patient not taking: Reported on 03/07/2024   cyanocobalamin 1000 MCG tablet 1,000 mcg, Daily (standard)   UNABLE TO FIND 1 tablet/capsule, Daily   empagliflozin (JARDIANCE) 10 mg tablet 10 mg, Oral, Daily (standard), For benefit inquiry only. Please send Epic chat message to Ashley Blue with coverage information, then discontinue Rx.       Designated Healthcare Decision Maker:  [name] currently lacks decisional capacity for healthcare decision-making and is unable to designate a surrogate healthcare decision maker. Ms. Gibeault designated healthcare decision maker(s) is/are [name] Magnone (the patient's spouse) as denoted by hospital policy for patients without a known preference.    Objective:   Physical Exam:  Temp:  [34.1 ??C (93.4 ??F)-36.7 ??C (98.1 ??F)] 34.6 ??C (94.3 ??F)  Pulse:  [63-88] 86  SpO2 Pulse:  [63-86] 86  Resp:  [6-24] 19  BP: (79-157)/(46-100) 114/90  FiO2 (%):  [3 %-40 %] 40 %  SpO2:  [84 %-100 %] 99 %    Gen: Sleeping with BiPAP mask on, intermittently agitated when waking  Eyes: Sclera anicteric, EOMI grossly normal   HENT: Atraumatic, normocephalic  Neck: Trachea midline  Heart: RRR, S1/S2, no r/g/m  Lungs: Breathing comfortably on BiPAP, coarse bilateral breath sounds  Abdomen: Soft, distended, non-tender  to palpation  Extremities: BLE trace pitting edema  Neuro: Non-participatory in exam/conversation; moving BUE independently against gravity    Skin:  No rashes, lesions on clothed exam  Psych: Non-alert, non-oriented
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
## 2024-03-11 LAB — BASIC METABOLIC PANEL
ANION GAP: 15 mmol/L — ABNORMAL HIGH (ref 5–14)
BLOOD UREA NITROGEN: 18 mg/dL (ref 9–23)
BUN / CREAT RATIO: 22
CALCIUM: 10 mg/dL (ref 8.7–10.4)
CHLORIDE: 99 mmol/L (ref 98–107)
CO2: 37 mmol/L — ABNORMAL HIGH (ref 20.0–31.0)
CREATININE: 0.82 mg/dL (ref 0.55–1.02)
EGFR CKD-EPI (2021) FEMALE: 74 mL/min/1.73m2 (ref >=60)
GLUCOSE RANDOM: 68 mg/dL — ABNORMAL LOW (ref 70–99)
POTASSIUM: 4.8 mmol/L (ref 3.4–4.8)
SODIUM: 151 mmol/L — ABNORMAL HIGH (ref 135–145)

## 2024-03-11 LAB — CBC
HEMATOCRIT: 43 % (ref 34.0–44.0)
HEMATOCRIT: 38 % (ref 34.0–44.0)
HEMOGLOBIN: 14.7 g/dL (ref 11.3–14.9)
HEMOGLOBIN: 12.9 g/dL (ref 11.3–14.9)
MEAN CORPUSCULAR HEMOGLOBIN CONC: 34.3 g/dL (ref 32.0–36.0)
MEAN CORPUSCULAR HEMOGLOBIN CONC: 34.1 g/dL (ref 32.0–36.0)
MEAN CORPUSCULAR HEMOGLOBIN: 33.2 pg — ABNORMAL HIGH (ref 25.9–32.4)
MEAN CORPUSCULAR HEMOGLOBIN: 32.7 pg — ABNORMAL HIGH (ref 25.9–32.4)
MEAN CORPUSCULAR VOLUME: 96.7 fL — ABNORMAL HIGH (ref 77.6–95.7)
MEAN CORPUSCULAR VOLUME: 96 fL — ABNORMAL HIGH (ref 77.6–95.7)
MEAN PLATELET VOLUME: 8.4 fL (ref 6.8–10.7)
MEAN PLATELET VOLUME: 9.2 fL (ref 6.8–10.7)
PLATELET COUNT: 92 10*9/L — ABNORMAL LOW (ref 150–450)
PLATELET COUNT: 88 10*9/L — ABNORMAL LOW (ref 150–450)
RED BLOOD CELL COUNT: 4.44 10*12/L (ref 3.95–5.13)
RED BLOOD CELL COUNT: 3.96 10*12/L (ref 3.95–5.13)
RED CELL DISTRIBUTION WIDTH: 16.2 % — ABNORMAL HIGH (ref 12.2–15.2)
RED CELL DISTRIBUTION WIDTH: 15.6 % — ABNORMAL HIGH (ref 12.2–15.2)
WBC ADJUSTED: 7.4 10*9/L (ref 3.6–11.2)
WBC ADJUSTED: 9 10*9/L (ref 3.6–11.2)

## 2024-03-11 LAB — BLOOD GAS CRITICAL CARE PANEL, ARTERIAL
BASE EXCESS ARTERIAL: 10.3 — ABNORMAL HIGH (ref -2.0–2.0)
CALCIUM IONIZED ARTERIAL (MG/DL): 4.49 mg/dL (ref 4.40–5.40)
CARBOXYHEMOGLOBIN: 1.2 % — ABNORMAL HIGH (ref <1.2)
CHLORIDE, WHOLE BLOOD: 105 mmol/L (ref 98–107)
GLUCOSE WHOLE BLOOD: 87 mg/dL (ref 70–179)
HCO3 ARTERIAL: 33 mmol/L — ABNORMAL HIGH (ref 22–27)
HEMOGLOBIN BLOOD GAS: 13.4 g/dL (ref 12.00–16.00)
LACTATE BLOOD ARTERIAL: 3.7 mmol/L — ABNORMAL HIGH (ref <1.3)
METHEMOGLOBIN: <1 % (ref <1.5)
O2 SATURATION ARTERIAL: 99.4 % (ref 94.0–100.0)
OXYHEMOGLOBIN: 98.1 % (ref 94.0–100.0)
PCO2 ARTERIAL: 35.6 mmHg (ref 35.0–45.0)
PH ARTERIAL: 7.57 — ABNORMAL HIGH (ref 7.35–7.45)
PO2 ARTERIAL: 124 mmHg — ABNORMAL HIGH (ref 80.0–110.0)
POTASSIUM WHOLE BLOOD: 3.5 mmol/L (ref 3.4–4.6)
SODIUM WHOLE BLOOD: 144 mmol/L (ref 135–145)

## 2024-03-11 LAB — CBC W/ AUTO DIFF
BASOPHILS ABSOLUTE COUNT: 0 10*9/L (ref 0.0–0.1)
BASOPHILS ABSOLUTE COUNT: 0 10*9/L (ref 0.0–0.1)
BASOPHILS RELATIVE PERCENT: 0.3 %
BASOPHILS RELATIVE PERCENT: 0.5 %
EOSINOPHILS ABSOLUTE COUNT: 0.1 10*9/L (ref 0.0–0.5)
EOSINOPHILS ABSOLUTE COUNT: 0.1 10*9/L (ref 0.0–0.5)
EOSINOPHILS RELATIVE PERCENT: 0.8 %
EOSINOPHILS RELATIVE PERCENT: 1.2 %
HEMATOCRIT: 38.5 % (ref 34.0–44.0)
HEMATOCRIT: 42 % (ref 34.0–44.0)
HEMOGLOBIN: 13.2 g/dL (ref 11.3–14.9)
HEMOGLOBIN: 14.2 g/dL (ref 11.3–14.9)
LYMPHOCYTES ABSOLUTE COUNT: 0.9 10*9/L — ABNORMAL LOW (ref 1.1–3.6)
LYMPHOCYTES ABSOLUTE COUNT: 1.5 10*9/L (ref 1.1–3.6)
LYMPHOCYTES RELATIVE PERCENT: 16 %
LYMPHOCYTES RELATIVE PERCENT: 18.3 %
MEAN CORPUSCULAR HEMOGLOBIN CONC: 33.9 g/dL (ref 32.0–36.0)
MEAN CORPUSCULAR HEMOGLOBIN CONC: 34.2 g/dL (ref 32.0–36.0)
MEAN CORPUSCULAR HEMOGLOBIN: 32.6 pg — ABNORMAL HIGH (ref 25.9–32.4)
MEAN CORPUSCULAR HEMOGLOBIN: 32.7 pg — ABNORMAL HIGH (ref 25.9–32.4)
MEAN CORPUSCULAR VOLUME: 95.5 fL (ref 77.6–95.7)
MEAN CORPUSCULAR VOLUME: 96.3 fL — ABNORMAL HIGH (ref 77.6–95.7)
MEAN PLATELET VOLUME: 8.2 fL (ref 6.8–10.7)
MEAN PLATELET VOLUME: 8.3 fL (ref 6.8–10.7)
MONOCYTES ABSOLUTE COUNT: 0.6 10*9/L (ref 0.3–0.8)
MONOCYTES ABSOLUTE COUNT: 1 10*9/L — ABNORMAL HIGH (ref 0.3–0.8)
MONOCYTES RELATIVE PERCENT: 10.7 %
MONOCYTES RELATIVE PERCENT: 12.3 %
NEUTROPHILS ABSOLUTE COUNT: 4.1 10*9/L (ref 1.8–7.8)
NEUTROPHILS ABSOLUTE COUNT: 5.6 10*9/L (ref 1.8–7.8)
NEUTROPHILS RELATIVE PERCENT: 68.1 %
NEUTROPHILS RELATIVE PERCENT: 71.8 %
NUCLEATED RED BLOOD CELLS: 0 /100{WBCs} (ref <=4)
NUCLEATED RED BLOOD CELLS: 0 /100{WBCs} (ref <=4)
PLATELET COUNT: 82 10*9/L — ABNORMAL LOW (ref 150–450)
PLATELET COUNT: 88 10*9/L — ABNORMAL LOW (ref 150–450)
RED BLOOD CELL COUNT: 4.03 10*12/L (ref 3.95–5.13)
RED BLOOD CELL COUNT: 4.36 10*12/L (ref 3.95–5.13)
RED CELL DISTRIBUTION WIDTH: 15.4 % — ABNORMAL HIGH (ref 12.2–15.2)
RED CELL DISTRIBUTION WIDTH: 15.9 % — ABNORMAL HIGH (ref 12.2–15.2)
WBC ADJUSTED: 5.7 10*9/L (ref 3.6–11.2)
WBC ADJUSTED: 8.2 10*9/L (ref 3.6–11.2)

## 2024-03-11 LAB — COMPREHENSIVE METABOLIC PANEL
ALBUMIN: 2.5 g/dL — ABNORMAL LOW (ref 3.4–5.0)
ALBUMIN: 2.2 g/dL — ABNORMAL LOW (ref 3.4–5.0)
ALBUMIN: 2.3 g/dL — ABNORMAL LOW (ref 3.4–5.0)
ALKALINE PHOSPHATASE: 131 U/L — ABNORMAL HIGH (ref 46–116)
ALKALINE PHOSPHATASE: 112 U/L (ref 46–116)
ALKALINE PHOSPHATASE: 117 U/L — ABNORMAL HIGH (ref 46–116)
ALT (SGPT): 28 U/L (ref 10–49)
ALT (SGPT): 25 U/L (ref 10–49)
ALT (SGPT): 24 U/L (ref 10–49)
ANION GAP: 11 mmol/L (ref 5–14)
ANION GAP: 17 mmol/L — ABNORMAL HIGH (ref 5–14)
ANION GAP: 14 mmol/L (ref 5–14)
AST (SGOT): 32 U/L (ref <=34)
AST (SGOT): 31 U/L (ref <=34)
AST (SGOT): 32 U/L (ref <=34)
BILIRUBIN TOTAL: 2 mg/dL — ABNORMAL HIGH (ref 0.3–1.2)
BILIRUBIN TOTAL: 2.1 mg/dL — ABNORMAL HIGH (ref 0.3–1.2)
BILIRUBIN TOTAL: 2.1 mg/dL — ABNORMAL HIGH (ref 0.3–1.2)
BLOOD UREA NITROGEN: 13 mg/dL (ref 9–23)
BLOOD UREA NITROGEN: 16 mg/dL (ref 9–23)
BLOOD UREA NITROGEN: 15 mg/dL (ref 9–23)
BUN / CREAT RATIO: 20
BUN / CREAT RATIO: 20
BUN / CREAT RATIO: 16
CALCIUM: 9.8 mg/dL (ref 8.7–10.4)
CALCIUM: 9.1 mg/dL (ref 8.7–10.4)
CALCIUM: 9.4 mg/dL (ref 8.7–10.4)
CHLORIDE: 100 mmol/L (ref 98–107)
CHLORIDE: 100 mmol/L (ref 98–107)
CHLORIDE: 102 mmol/L (ref 98–107)
CO2: 38 mmol/L — ABNORMAL HIGH (ref 20.0–31.0)
CO2: 32.3 mmol/L — ABNORMAL HIGH (ref 20.0–31.0)
CO2: 31 mmol/L (ref 20.0–31.0)
CREATININE: 0.66 mg/dL (ref 0.55–1.02)
CREATININE: 0.82 mg/dL (ref 0.55–1.02)
CREATININE: 0.92 mg/dL (ref 0.55–1.02)
EGFR CKD-EPI (2021) FEMALE: 90 mL/min/1.73m2 (ref >=60)
EGFR CKD-EPI (2021) FEMALE: 74 mL/min/1.73m2 (ref >=60)
EGFR CKD-EPI (2021) FEMALE: 64 mL/min/1.73m2 (ref >=60)
GLUCOSE RANDOM: 85 mg/dL (ref 70–179)
GLUCOSE RANDOM: 85 mg/dL (ref 70–99)
GLUCOSE RANDOM: 92 mg/dL (ref 70–179)
POTASSIUM: 4.5 mmol/L (ref 3.4–4.8)
POTASSIUM: 4 mmol/L (ref 3.4–4.8)
POTASSIUM: 3.6 mmol/L (ref 3.4–4.8)
PROTEIN TOTAL: 5.7 g/dL (ref 5.7–8.2)
PROTEIN TOTAL: 5 g/dL — ABNORMAL LOW (ref 5.7–8.2)
PROTEIN TOTAL: 5.1 g/dL — ABNORMAL LOW (ref 5.7–8.2)
SODIUM: 149 mmol/L — ABNORMAL HIGH (ref 135–145)
SODIUM: 149 mmol/L — ABNORMAL HIGH (ref 135–145)
SODIUM: 147 mmol/L — ABNORMAL HIGH (ref 135–145)

## 2024-03-11 LAB — BLOOD GAS CRITICAL CARE PANEL, VENOUS
BASE EXCESS VENOUS: 14.6 — ABNORMAL HIGH (ref -2.0–2.0)
BASE EXCESS VENOUS: 6.6 — ABNORMAL HIGH (ref -2.0–2.0)
BASE EXCESS VENOUS: 8.8 — ABNORMAL HIGH (ref -2.0–2.0)
CALCIUM IONIZED VENOUS (MG/DL): 4.12 mg/dL — ABNORMAL LOW (ref 4.40–5.40)
CALCIUM IONIZED VENOUS (MG/DL): 4.74 mg/dL (ref 4.40–5.40)
CALCIUM IONIZED VENOUS (MG/DL): 4.95 mg/dL (ref 4.40–5.40)
CARBOXYHEMOGLOBIN, VENOUS: 1.7 % — ABNORMAL HIGH (ref <1.2)
CHLORIDE, WHOLE BLOOD: 104 mmol/L (ref 98–107)
FIO2 VENOUS: 40
GLUCOSE WHOLE BLOOD: 377 mg/dL (ref 54–400)
GLUCOSE WHOLE BLOOD: 81 mg/dL (ref 54–400)
GLUCOSE WHOLE BLOOD: 81 mg/dL (ref 70–179)
HCO3 VENOUS: 30 mmol/L — ABNORMAL HIGH (ref 22–27)
HCO3 VENOUS: 32 mmol/L — ABNORMAL HIGH (ref 22–27)
HCO3 VENOUS: 35 mmol/L — ABNORMAL HIGH (ref 22–27)
HEMOGLOBIN BLOOD GAS: 10.8 g/dL — ABNORMAL LOW (ref 12.00–16.00)
HEMOGLOBIN BLOOD GAS: 12.4 g/dL (ref 12.00–16.00)
HEMOGLOBIN BLOOD GAS: 14.9 g/dL (ref 12.00–16.00)
LACTATE BLOOD VENOUS: 2 mmol/L — ABNORMAL HIGH (ref 0.5–1.8)
LACTATE BLOOD VENOUS: 2.9 mmol/L — ABNORMAL HIGH (ref 0.5–1.8)
LACTATE BLOOD VENOUS: 6.2 mmol/L (ref 0.5–1.8)
METHEMOGLOBIN, VENOUS: <1 % (ref <1.5)
O2 SATURATION VENOUS: 68.3 % (ref 40.0–85.0)
O2 SATURATION VENOUS: 93.5 % — ABNORMAL HIGH (ref 40.0–85.0)
O2 SATURATION VENOUS: 93.6 % — ABNORMAL HIGH (ref 40.0–85.0)
OXYHEMOGLOBIN, VENOUS: 66.8 % (ref 40.0–85.0)
PCO2 VENOUS: 37 mmHg — ABNORMAL LOW (ref 40–60)
PCO2 VENOUS: 39 mmHg — ABNORMAL LOW (ref 40–60)
PCO2 VENOUS: 73 mmHg (ref 40–60)
PH VENOUS: 7.35 (ref 7.32–7.43)
PH VENOUS: 7.49 — ABNORMAL HIGH (ref 7.32–7.43)
PH VENOUS: 7.54 — ABNORMAL HIGH (ref 7.32–7.43)
PO2 VENOUS: 33 mmHg (ref 30–55)
PO2 VENOUS: 63 mmHg — ABNORMAL HIGH (ref 35–40)
PO2 VENOUS: 69 mmHg — ABNORMAL HIGH (ref 35–40)
POTASSIUM WHOLE BLOOD: 3.8 mmol/L (ref 3.4–4.6)
POTASSIUM WHOLE BLOOD: 4.4 mmol/L (ref 3.4–4.6)
POTASSIUM WHOLE BLOOD: 5.3 mmol/L — ABNORMAL HIGH (ref 3.4–4.6)
SODIUM WHOLE BLOOD: 130 mmol/L — ABNORMAL LOW (ref 135–145)
SODIUM WHOLE BLOOD: 146 mmol/L — ABNORMAL HIGH (ref 135–145)
SODIUM WHOLE BLOOD: 146 mmol/L — ABNORMAL HIGH (ref 135–145)

## 2024-03-11 LAB — PROTIME-INR
INR: 1.58
INR: 1.59
INR: 1.61
INR: 1.63
PROTIME: 18 s — ABNORMAL HIGH (ref 9.9–12.6)
PROTIME: 18.1 s — ABNORMAL HIGH (ref 9.9–12.6)
PROTIME: 18.3 s — ABNORMAL HIGH (ref 9.9–12.6)
PROTIME: 18.6 s — ABNORMAL HIGH (ref 9.9–12.6)

## 2024-03-11 LAB — MAGNESIUM
MAGNESIUM: 2.2 mg/dL (ref 1.6–2.6)
MAGNESIUM: 2.3 mg/dL (ref 1.6–2.6)
MAGNESIUM: 2.1 mg/dL (ref 1.6–2.6)
MAGNESIUM: 2.1 mg/dL (ref 1.6–2.6)

## 2024-03-11 LAB — HEPATIC FUNCTION PANEL
ALBUMIN: 2.5 g/dL — ABNORMAL LOW (ref 3.4–5.0)
ALKALINE PHOSPHATASE: 133 U/L — ABNORMAL HIGH (ref 46–116)
ALT (SGPT): 30 U/L (ref 10–49)
AST (SGOT): 33 U/L (ref <=34)
BILIRUBIN DIRECT: 0.9 mg/dL — ABNORMAL HIGH (ref 0.00–0.30)
BILIRUBIN TOTAL: 2.3 mg/dL — ABNORMAL HIGH (ref 0.3–1.2)
PROTEIN TOTAL: 5.7 g/dL (ref 5.7–8.2)

## 2024-03-11 LAB — FIBRINOGEN
FIBRINOGEN LEVEL: 130 mg/dL — ABNORMAL LOW (ref 177–386)
FIBRINOGEN LEVEL: 129 mg/dL — ABNORMAL LOW (ref 177–386)
FIBRINOGEN LEVEL: 137 mg/dL — ABNORMAL LOW (ref 175–500)

## 2024-03-11 LAB — BLOOD GAS, VENOUS
BASE EXCESS VENOUS: 16.5 — ABNORMAL HIGH (ref -2.0–2.0)
BASE EXCESS VENOUS: 13.3 — ABNORMAL HIGH (ref -2.0–2.0)
HCO3 VENOUS: 36 mmol/L — ABNORMAL HIGH (ref 22–27)
HCO3 VENOUS: 35 mmol/L — ABNORMAL HIGH (ref 22–27)
O2 SATURATION VENOUS: 53.4 % (ref 40.0–85.0)
O2 SATURATION VENOUS: 73 % (ref 40.0–85.0)
PCO2 VENOUS: 58 mmHg (ref 40–60)
PCO2 VENOUS: 44 mmHg (ref 40–60)
PH VENOUS: 7.46 — ABNORMAL HIGH (ref 7.32–7.43)
PH VENOUS: 7.53 — ABNORMAL HIGH (ref 7.32–7.43)
PO2 VENOUS: 28 mmHg — ABNORMAL LOW (ref 35–40)
PO2 VENOUS: 37 mmHg (ref 35–40)

## 2024-03-11 LAB — CYSTATIN C
CYSTATIN C: 1.9 mg/L — ABNORMAL HIGH (ref 0.64–1.23)
EGFR CKD-EPI (2012) CYSTATIN C FEMALE: 29 mL/min/1.73m2 — ABNORMAL LOW (ref >=60)

## 2024-03-11 LAB — APTT
APTT: 36.5 s (ref 24.8–38.4)
APTT: 32.6 s (ref 24.8–38.4)
APTT: 30.7 s (ref 24.8–38.4)
HEPARIN CORRELATION: 0.2
HEPARIN CORRELATION: 0.2
HEPARIN CORRELATION: 0.2

## 2024-03-11 LAB — PLATELET COUNT: PLATELET COUNT: 76 10*9/L — ABNORMAL LOW (ref 150–450)

## 2024-03-11 LAB — D-DIMER, QUANTITATIVE
D-DIMER QUANTITATIVE (ACL TOP): 397 ng{FEU}/mL (ref <=500)
D-DIMER QUANTITATIVE (ACL TOP): 386 ng{FEU}/mL (ref <=500)
D-DIMER QUANTITATIVE (ACL TOP): 301 ng{FEU}/mL (ref <=500)

## 2024-03-11 LAB — PHOSPHORUS: PHOSPHORUS: 4.7 mg/dL (ref 2.4–5.1)

## 2024-03-11 LAB — AMMONIA: AMMONIA: 251 umol/L — ABNORMAL HIGH (ref 11–32)

## 2024-03-11 MED ADMIN — octreotide 500 mcg in sodium chloride 0.9 % 100 mL (5 mcg/mL) infusion: 50 ug/h | INTRAVENOUS | @ 14:00:00 | Stop: 2024-03-11

## 2024-03-11 MED ADMIN — lactulose enema: 200 g | RECTAL | @ 01:00:00 | Stop: 2024-03-11

## 2024-03-11 MED ADMIN — lactated ringers bolus 500 mL: 500 mL | INTRAVENOUS | @ 18:00:00 | Stop: 2024-03-11

## 2024-03-11 MED ADMIN — ROCuronium (ZEMURON) injection 54.7 mg: 1 mg/kg | INTRAVENOUS | @ 13:00:00 | Stop: 2024-03-11

## 2024-03-11 MED ADMIN — dextrose 5 % infusion: 50 mL/h | INTRAVENOUS | @ 12:00:00 | Stop: 2024-03-17

## 2024-03-11 MED ADMIN — lactulose enema: 200 g | RECTAL | @ 12:00:00 | Stop: 2024-03-11

## 2024-03-11 MED ADMIN — lactated ringers bolus 1,000 mL: 1000 mL | INTRAVENOUS | @ 17:00:00 | Stop: 2024-03-11

## 2024-03-11 MED ADMIN — fentaNYL (PF) (SUBLIMAZE) injection 25 mcg: 25 ug | INTRAVENOUS | @ 16:00:00 | Stop: 2024-03-18

## 2024-03-11 MED ADMIN — cefTRIAXone (ROCEPHIN) 2 g in sodium chloride 0.9 % (NS) 100 mL IVPB-MBP: 2 g | INTRAVENOUS | @ 14:00:00 | Stop: 2024-03-11

## 2024-03-11 MED ADMIN — octreotide (SandoSTATIN) injection 100 mcg: 100 ug | INTRAVENOUS | @ 14:00:00 | Stop: 2024-03-11

## 2024-03-11 MED ADMIN — NORepinephrine bitartrate-D5W 8 mg/250 mL (32 mcg/mL) infusion: INTRAVENOUS | @ 17:00:00 | Stop: 2024-03-11

## 2024-03-11 MED ADMIN — lactated ringers bolus 500 mL: 500 mL | INTRAVENOUS | @ 14:00:00 | Stop: 2024-03-11

## 2024-03-11 MED ADMIN — lactulose enema: 200 g | RECTAL | @ 07:00:00 | Stop: 2024-03-11

## 2024-03-11 MED ADMIN — etomidate (AMIDATE) injection 16.4 mg: .3 mg/kg | INTRAVENOUS | @ 13:00:00 | Stop: 2024-03-11

## 2024-03-11 MED ADMIN — piperacillin-tazobactam (ZOSYN) IVPB (premix) 4.5 g: 4.5 g | INTRAVENOUS | @ 17:00:00 | Stop: 2024-03-11

## 2024-03-11 MED ADMIN — Propofol (DIPRIVAN) 10 mg/mL injection: INTRAVENOUS | @ 13:00:00 | Stop: 2024-03-11

## 2024-03-11 MED ADMIN — lactulose (CEPHULAC) packet 20 g: 20 g | ORAL | @ 20:00:00

## 2024-03-11 MED ADMIN — pantoprazole (Protonix) injection 40 mg: 40 mg | INTRAVENOUS | @ 14:00:00 | Stop: 2024-03-11

## 2024-03-11 MED ADMIN — NORepinephrine 8 mg in dextrose 5 % 250 mL (32 mcg/mL) infusion PMB: 0-30 ug/min | INTRAVENOUS | @ 17:00:00

## 2024-03-11 MED ADMIN — propofol (DIPRIVAN) infusion 10 mg/mL: 0-50 ug/kg/min | INTRAVENOUS | @ 13:00:00

## 2024-03-11 NOTE — Unmapped (Signed)
 ETT advanced 23cm at the lip per Avel, Marcel Mart, MD.

## 2024-03-11 NOTE — Unmapped (Addendum)
 Shift Summary  Oxygen support was adjusted briefly during the shift, including a brief switch to nasal cannula (3L with stats in the mid 90s), although patient remained on 40% BiPAP for most of the shift.  Warming blanket was applied at the beginning of the shift with temps 34C and later removed as temperature improved over the course of the shift.   Blood gas and laboratory tests were performed to monitor respiratory and metabolic status, with results guiding ongoing care.  PC02 remains elevated in the 70s and Ammonia remained on the rise, MD aware.  Lactulose  enemas admin as ordered.  Portable chest X-ray was completed to assess pulmonary status.   Overall, skin integrity was maintained, and assistance with activities of daily living continued to be required throughout the shift.  Pt neuro status fluctuated from restless and pulling at lines to lethargic.  Sitter required.    Skin Health and Integrity: Skin integrity remained stable throughout the shift with persistent abrasion, bruising, and redness, and interventions such as frequent repositioning, pressure reduction techniques, and specialty bed use were maintained to protect skin and pressure points.    Improved Ability to Complete Activities of Daily Living: Maximum assistance was required for mobility, hygiene, and bathing throughout the shift, with no change in level of independence noted.    Effective Breathing Pattern: Respiratory pattern was supported by AVAPS and BiPAP for most of the shift, with a brief period on nasal cannula; respiratory rate fluctuated but remained within a manageable range.    Optimal Gas Exchange: SpO2 levels were mostly within normal limits except for a brief drop, and oxygen support was adjusted as needed, including changes in FiO2 and O2 device; blood gas results reflected ongoing management of oxygenation and ventilation.    Optimal Cognitive Function: Disorientation and poor cognition persisted throughout the shift, with positive CAM-ICU features and no improvement in orientation or attention noted.

## 2024-03-11 NOTE — Unmapped (Signed)
 Arterial Line Insertion Procedure Note     Date of Service: 03/11/2024    Patient Name:: [name]  Patient MRN: [id number]    Indications: Hemodynamic monitoring    Consent:   I explained the potential benefits and risks of the procedure, including  temporary vascular occlusion, thrombosis, ischemia, hematoma formation, local/catheter-related infection and nerve/tissue damage. I explained potential alternatives. The patient/HCDM understands these risks, agrees to the procedure, and signed the informed consent form.    Procedure Details:   Time-out was performed immediately prior to the procedure to verify correct patient, procedure, site, positioning, and special equipment if applicable.    Dasie???s test was performed to ensure adequate perfusion. The patient???s left wrist was prepped and draped in sterile fashion. 1% Lidocaine  was used to anesthetize the area. A 20 G Arrow line was introduced into the radial artery with ultrasound guidance. The catheter was threaded over the guide wire and the needle was removed with appropriate pulsatile blood return. The catheter was then sutured in place to the skin and a sterile CHG dressing applied. Perfusion to the extremity distal to the point of catheter insertion was checked and found to be adequate.  A pressure transducer was connected sterilely to the arterial line and an arterial line waveform was noted on the monitor.     Estimated Blood Loss: 3 ml    Condition:  The patient tolerated the procedure well and remains in the same condition as pre-procedure.    Complications:  The patient tolerated the procedure well and there were no complications.    Plan:  Titrate pressors    Torin Whisner S Kimmberly Wisser, ACNP
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
## 2024-03-11 NOTE — Unmapped (Signed)
 ULTRASOUND PIV PROCEDURE NOTE    Indications:   Poor venous access.    Ultrasound guidance was necessary to obtain access.     Procedure Details:  Identity of the patient was confirmed via name, medical record number and date of birth. The availability of the correct equipment was verified.    The vein was identified and measured for ultrasound catheter insertion.       Vein measurement (without tourniquet):   0.33 cm   A(n) 20 gauge 1 catheter was selected based on the recommendations below:    [facility] Catheter/Vein Ratio Guidelines    Chart to determine PIV catheter size/length to use based on vein diameter and depth   Catheter Gauge Size (g)  22g 20g 18g   Catheter length (inches)  1.75 1.75 1.75   Catheter diameter measurement (mm) 0.9 mm 1.1 mm 1.3 mm          Minimum required vein diameter       Sonosite (cm)  0.27 cm 0.33 cm 0.39 cm          Maximum vein depth  1.25 cm 1.25 cm 1.25 cm          The field was prepared with necessary supplies and equipment.  Probe cover and sterile gel utilized. Insertion site was prepped with chlorhexidineand allowed to dry.  The catheter extension was primed with normal saline.  The US  PIV was placed in the LAC with 1attempt(s). See LDA for additional details.    Catheter aspirated, 3 mL blood return present. The catheter was then flushed with 10 mL of normal saline. Insertion site cleansed, and dressing applied per manufacturer guidelines. The catheter was inserted with difficulty due to poor vasculature by Chiquita Hole, RN.    Thank you,     Chiquita Hole, RN    Ultrasound Resource Nurse    Workup / Procedure Time:  15 minutes

## 2024-03-11 NOTE — Unmapped (Addendum)
 Shift Summary  Patient with altered mental status; unable to follow command. Given lactulose  enema with melena response.  Patient unable to maintain respiratory status off BiPAP; intubated by RT. Oxygen therapy and ventilator support were maintained, with SpO2  stable and FiO2 unchanged at 40%.  Patient comfort maintained with continuous propofol  and PRN fentanyl .  Access limited-CVC and arterial line attempted. Per MD not to use CVC due to malposition.     Skin Health and Integrity: Skin protection interventions such as incontinence pads, barrier creams, and frequent weight shifts were maintained throughout the shift, with continued use of pressure-redistributing mattress and positioning supports; bruising persisted but no new skin breakdown was documented.    Improved Ability to Complete Activities of Daily Living: Remained dependent for bathing and oral care, with staff assistance provided for hygiene needs; no improvement in independence was noted during the shift.    Effective Breathing Pattern: Respiratory pattern remained regular and unlabored, with ventilator control documented later in the shift    Optimal Gas Exchange: SpO2 was maintained and oxygen therapy continued; FiO2 remained at 40% throughout, and blood gas results later in the shift showed elevated pH and bicarbonate with adequate oxygen saturation on venous samples.    Optimal Cognitive Function: Cognitive status fluctuated with persistent disorientation and intermittent agitation, and CAM-ICU assessments remained positive for altered consciousness and inattention throughout the shift.

## 2024-03-11 NOTE — Unmapped (Signed)
 Pt on continuous AVAPS overnight with one short break (tolerated well).  Following VBGs.   Improvement in CO2 noted this shift, no changes made.      Problem: Gas Exchange Impaired  Goal: Optimal Gas Exchange  Outcome: Ongoing - Unchanged

## 2024-03-11 NOTE — Unmapped (Signed)
 PULMONARY CONSULT  NOTE      Patient: [name](1946/04/19)  Reason for consultation: [name] is a 78 y.o. female who is seen in consultation at the request of Lamar Jackquline Schiller, MD for comprehensive evaluation of acute on chronic hypercarbic respiratory failure.    Assessment and Recommendations:      Principal Problem:    Atrial fibrillation    (CMS-HCC)  Active Problems:    Alcoholic liver disease (HHS-HCC)    HTN (hypertension)    Class 2 severe obesity with serious comorbidity in adult    A-fib (CMS-HCC)    [name] is a 78 y.o. female with PMH HTN, breast cancer s/p partial mastectomy on anastrozole , compensated NASH cirrhosis, and recent admission for with acute HFpEF in the setting of new onset atrial fibrillation (98/29-10/3) who was admitted for planned DC cardioversion but has developed acute on chronic hypercarbic respiratory failure and altered mental status 2/2 hepatic encephalopathy due to newly decompensated cirrhosis. Course complicated by acute GI bleed.    Although her hypercarbia improved with AVAPS, she was intubated this morning due to acute hypoxic respiratory failure from hypoventilation likely due to her encephalopathy.     Recommendations:  Neuro: hepatic encephalopathy  - Propofol  for sedation to allow for easy evaluation of neurologic status  - Holding off on OGT placement and lactulose  administration due to concern for active variceal bleed  - PRN fentanyl  for analgesia    2. Pulmonary: acute hypoxic respiratory failure, acute on chronic hypercarbic respiratory failure  - Continue mechanical ventilation with current settings (PRVC 20/450/5/40)  - Awaiting post-intubation blood gas, can likely reduce TV to 6 ml/kg IBW if acidosis is stable  - Ventilation order bundle    3. Cardiovascular: Atrial fibrillation, known left atrial appendage clot, HFpEF  - holding coreg , can restart if she remains hemodynamically stable post-intubation  - holding anticoagulation in setting of active GI bleed  - holding GDMT    4. Renal: AKI   - strict Is/Os  - check urine electrolytes, urine urea    5. GI: decompensated cirrhosis with HE, GI bleed  - empirically treat for variceal bleed with PPI, octreotide, ceftriaxone  - GI consulted  - holding off on OGT and lactulose  given active bleed  - HD stable for now, will follow CBC q 6 hrs  - in case of HD instability, consider DOAC reversal (last dose 10/9 AM)  - transfer to main for hepatology evaluation    6. ID  - continue ceftriaxone as above    7. Heme/coag: GI bleed  - per GI plan    8. Endocrine: no active issues    CHECKLIST   Can CVC be removed? N/A, no CVC present (including vascular catheter for HD or PLEX)   Can A-line be removed? N/A, no A-line present  Can Foley be removed? No: Need continuous I/O  Mobility plan: Step 1 - Range of motion    Feeding: NPO for procedure  Analgesia: Pain adequately controlled  Sedation SAT/SBT: Yes  Thrombembolic ppx: Mechanical only, chemical contraindicated secondary to active bleeding in last 48 hours  Head of bed >30 degrees: Yes  Ulcer ppx: On treatment PPI for GI bleed  Glucose within target range: Yes, in range    RASS at goal? Yes  Can antipsychotics be stopped? N/A, not on antipsychotics    Disposition:  Continue ICU care.    The patient is critically ill with the above problems. I personally spent 90 minutes, excluding procedures,  in critical care time examining the patient, evaluating the hemodynamic, laboratory, and radiographic data, developing a comprehensive management plan, and serially assessing the patient's response to our critical care interventions.      We appreciate the opportunity to assist in the care of this patient.  Please page [phone] with any questions.    Seanmichael Salmons A. Avel, MD      Subjective:      History of Present Illness:  [name] is a 78 y.o. female with PMH HTN, breast cancer s/p partial mastectomy on anastrozole , compensated NASH cirrhosis, and recent admission for with acute HFpEF in the setting of new onset atrial fibrillation (98/29-10/3) who was admitted for planned DC cardioversion. Patient was reportedly somnolent per family members prior to presentation today. She was sedated with propofol  infusion and underwent TEE which demonstrated a significant left atrial appendage thrombus and cardioversion was aborted. However, she was difficult to wake up following sedation. ABG at that time demonstrated a respiratory acidosis and patient was placed on BiPAP. Her tidal volumes were quite small on BiPAP (approximately 200 ml) and repeat ABG demonstrated minimal improvement so she was converted to AVAPS which she is on at present.     Per daughter Sari who was at bedside, patient has seemed to be more somnolent than usual during the daytime since before her recent admission. However, since discharge, she has been more confused - still verbal but not always clear on where she is or what is happening. Sari notes that she has also appeared more short of breath and she does hear her gasp and stop breathing sometimes when she sleeps.   I    Review of Systems: A comprehensive review of systems was performed and was negative except as above in HPI  Past Medical History[1]  Past Surgical History[2]  Medications reviewed in Epic  Allergies as of 03/08/2024 - Reviewed 03/08/2024   Allergen Reaction Noted    Sulfa (sulfonamide antibiotics) Anaphylaxis 06/03/2017    Sulfur  Anaphylaxis 02/28/2015    Aspirin  07/24/2017     Family History[3]  Social History     Tobacco Use    Smoking status: Never     Passive exposure: Past    Smokeless tobacco: Never   Substance Use Topics    Alcohol use: Not Currently        Objective:      Physical Exam:  Vitals:    03/11/24 0945 03/11/24 1000 03/11/24 1015 03/11/24 1030   BP: 145/72 117/53 113/65 103/75   Pulse: 86 94 88 87   Resp: 20 20 20 20    Temp: 36.5 ??C (97.7 ??F) 36.4 ??C (97.5 ??F) 36.4 ??C (97.5 ??F) 36.3 ??C (97.3 ??F)   TempSrc:  Bladder     SpO2: 100% 94% 95% 93%   Weight:       Height:         General: Drowsy but arouses to loud voice, answers questions intermittently then drifts back to sleep  Eyes: Anicteric sclera, conjunctiva clear.  ENT:  Mucous membranes moist and intact.  Lungs: Difficult to auscultate due to NIPPV but appears to have scattered coarse breath sounds bilaterally  Cardiovascular: Irregularly irregular rhythm.  Abdomen: Soft, non-tender, not distended  Musculoskeletal: No clubbing and no synovitis. Trace LE edema bilaterally.  Neuro: No focal neurological deficits.    Malnutrition Assessment by RD:          Diagnostic Review:   All labs and images were personally reviewed.           [  1]   Past Medical History:  Diagnosis Date    A-fib (CMS-HCC)     Alcoholism    (CMS-HCC)     Alcoholism /alcohol abuse     Cirrhosis    (CMS-HCC)     Depression     Hypertension     Liver disease     Malignant neoplasm of overlapping sites of right breast in female, estrogen receptor positive    (CMS-HCC) 10/03/2020   [2]   Past Surgical History:  Procedure Laterality Date    BREAST BIOPSY Right     benign-a long time ago    BREAST BIOPSY Right 07/2020    malignant    BREAST LUMPECTOMY Right     4 2022    CHOLECYSTECTOMY      PR BX/REMV,LYMPH NODE,DEEP AXILL Right 09/03/2020    Procedure: BX/EXC LYMPH NODE; OPEN, DEEP AXILRY NODE;  Surgeon: Alm Elsie Como, MD;  Location: [facility] OR [facility];  Service: Surgical Oncology Breast    PR CARDIOVERSION, ELECTIVE;EXTERN N/A 03/10/2024    Procedure: CARDIOVERSION, ELECTIVE, ELECTRICAL CONVERSION OF ARRHYTHMIA; EXTERNAL;  Surgeon: Sedalia Velma Hamilton, MD;  Location: [facility] OR [facility];  Service: Cardiology    PR ECHO HEART,TRANSESOPHAGEAL,COMPLETE Midline 03/10/2024    Procedure: ECHOCARDIOGRAPHY, TRANSESOPHAGEAL, REAL-TIME WITH IMAGE DOCUMENTATION;  Surgeon: Sedalia Velma Hamilton, MD;  [facility];  Service: Cardiology    PR INTRAOPERATIVE SENTINEL LYMPH NODE ID W DYE INJECTION Right 09/03/2020    Procedure: INTRAOPERATIVE IDENTIFICATION SENTINEL LYMPH NODE(S) INCLUDE INJECTION NON-RADIOACTIVE DYE, WHEN PERFORMED;  Surgeon: Alm Elsie Como, MD;  Location: [facility];  Service: Surgical Oncology Breast    PR MASTECTOMY, PARTIAL Right 09/03/2020    Procedure: MASTECTOMY, PARTIAL (EG, LUMPECTOMY, TYLECTOMY, QUADRANTECTOMY, SEGMENTECTOMY);  Surgeon: Alm Elsie Como, MD;  Location: [facility] OR [facility];  Service: Surgical Oncology Breast    PR UPPER GI ENDOSCOPY,BIOPSY N/A 02/03/2018    Procedure: UGI ENDOSCOPY; WITH BIOPSY, SINGLE OR MULTIPLE;  Surgeon: Eleanor Dewey Sorrel, MD;  Location: [facility];  Service: Gastroenterology    RADIATION Right     unsure finish date    TUBAL LIGATION     [3]   Family History  Problem Relation Age of Onset    Heart disease Mother     No Known Problems Father     No Known Problems Sister     No Known Problems Daughter     No Known Problems Maternal Grandmother     No Known Problems Maternal Grandfather     No Known Problems Paternal Grandmother     No Known Problems Paternal Grandfather     Lupus Brother     No Known Problems Other     BRCA 1/2 Neg Hx     Breast cancer Neg Hx     Cancer Neg Hx     Colon cancer Neg Hx     Endometrial cancer Neg Hx     Ovarian cancer Neg Hx     Mental illness Neg Hx     Substance Abuse Disorder Neg Hx

## 2024-03-11 NOTE — Unmapped (Signed)
 ETT repositioned to 21cm at the lip.

## 2024-03-11 NOTE — Unmapped (Signed)
 ADVANCE CARE PLANNING NOTE    Discussion Date:  March 11, 2024    Patient has decisional capacity:  No    Patient has selected a Health Care Decision-Maker if loses capacity: Yes    Health Care Decision Maker as of 03/11/2024    HCDM (patient stated preference): Starks,[name]    HCDM, First AlternateEvolet, Salminen Daughter - [phone]    Discussion Participants:  Daughter, Jaydyn Menon [facility]  Myself, internal medicine resident    Communication of Medical Status/Prognosis:   Provided ACP services at various intervals throughout the day, summarized in this note.     Shared that Mrs. Ensey' altered mental status has progressed despite correcting her hypercarbia. Further, her ammonia is up-trending despite lactulose  enemas. When we attempted to transition from BiPAP, her respiratory status acutely worsened with RR 6-8 and SpO2 <70%. She does not have the mental status and we do not have NG/OG access to provide lactulose  enterally. Thus, I recommended proceeding with intubation due to her inability to protect her airway and enteral access to provide medications and lactulose . We discussed the need for central and arterial access to provide medications, draw blood, and monitor BP dynamically. These interventions are imperative to monitor her and provide treatment for GNR bacteremia and hepatic encephalopathy, both of which are critical illness posing a threat to her life.     Communication of Treatment Goals/Options:   [name] is Mrs. Camposano' HCDM and understands her living will. Her mother always described that she would want everything to be done, or at least tried. This includes intubation and cardiopulmonary resuscitation. However, she would want to scale back aggressive interventions if the medical team did not think she could improve. Mrs. Beaumont would not want permanent life supportive measures, but would want a trial at aggressive management. She would not want futile interventions to prolong her life.     Treatment Decisions:   - proceed with intubation  - consented for CVC and arterial lines  - transfer to [facility] for further ICU and sub-specialty care  - FULL CODE. However re-engage pending clinical course. Would not want permanent, life prolonging measures such as feeding tube, tracheostomy per her living will. If her clinical status were to worsen or any aspect of her mental status become irreversible, her family would likely transition to comfort measures.         I spent 60 minutes providing voluntary advance care planning services for this patient.

## 2024-03-11 NOTE — Unmapped (Signed)
 Central Venous Catheter Insertion Procedure Note     Date of Service: 03/11/2024    Patient Name: [name]  Patient MRN: [id number]    Line type:  Triple Lumen    Indications:  Medications requiring central access    Consent:  I explained the potential benefits and risks of the procedure, including  catheter malposition, pneumothorax, cardiac complications, thrombosis, hematoma formation, arterial cannulation, air embolism local/catheter-related infection, nerve/tissue damage and sepsis. I explained potential alternatives. The patient/HCDM understands these risks, agrees to the procedure, and signed the informed consent form.    Procedure Details:   Time-out was performed immediately prior to the procedure.    The right internal jugular vein was identified using bedside ultrasound. This area was prepped and draped in the usual sterile fashion. Maximum sterile technique was used including antiseptics, cap, gloves, gown, hand hygiene, mask, and sterile sheet.  The patient was placed in Trendelenburg position. Local anesthesia with 1% lidocaine  was applied subcutaneously then deep to the skin. The finder was then inserted into the internal jugular vein using ultrasound guidance.    Using the Seldinger Technique a Triple Lumen was placed with each port easily flushed and freely drawing venous blood.    The catheter was secured with sutures. A sterile CHG drsg was applied to the site.    Condition:  The patient tolerated the procedure well and remains in the same condition as pre-procedure.    Complications:  None; patient tolerated the procedure well.    Plan:  CXR pending    Maykel Reitter R Ashawnti Tangen, PA

## 2024-03-11 NOTE — Unmapped (Signed)
 Central Venous Catheter Insertion Procedure Note     Date of Service: 03/11/2024    Patient Name: [name]  Patient MRN: [id number]    Line type:  Triple Lumen    Indications:  Inadequate peripheral access    Consent:  I explained the potential benefits and risks of the procedure, including  catheter malposition, pneumothorax, cardiac complications, thrombosis, hematoma formation, arterial cannulation, air embolism local/catheter-related infection, nerve/tissue damage and sepsis. I explained potential alternatives. The patient/HCDM understands these risks, agrees to the procedure, and signed the informed consent form.    Procedure Details:   Time-out was performed immediately prior to the procedure.    The left internal jugular vein was identified using bedside ultrasound. This area was prepped and draped in the usual sterile fashion. Maximum sterile technique was used including antiseptics, cap, gloves, gown, hand hygiene, mask, and sterile sheet.  The patient was placed in Trendelenburg position. Local anesthesia with 1% lidocaine  was applied subcutaneously then deep to the skin. The introducer was then inserted into the internal jugular vein using ultrasound guidance.    Using the Seldinger Technique a Triple Lumen was placed with each port easily flushed and freely drawing venous blood.    The catheter was secured with sutures. A sterile CHG drsg was applied to the site.    Condition:  The patient tolerated the procedure well and remains in the same condition as pre-procedure.    Complications:  Mild hematoma at the puncture site, held pressure and stable.    Plan:  CXR obtained and line does not cross midline. Advanced to 20cm and repeat CXR pending. Recommend replacing line within 48-72h given emergent nature of line and sub-optimal sterility when line was advanced.     Almarie JONELLE Coin, MD

## 2024-03-11 NOTE — Unmapped (Signed)
 Internal Medicine (MEDM) Progress Note    Assessment & Plan:   [name] is a 78 y.o. female who is presenting to [facility] with Bacteremia, escherichia coli, in the setting of the following pertinent/contributing co-morbidities:  HFpEF, HTN, Cirrhosis, Breast Cancer s/p Partial Mastectomy/Radiation.    Patient is ICU status. Please see assessment and plan by system below.     Principal Problem:    Bacteremia, escherichia coli  Active Problems:    Alcoholic liver disease (HHS-HCC)    HTN (hypertension)    Depression with anxiety    Class 2 severe obesity with serious comorbidity in adult    Atrial fibrillation    (CMS-HCC)    Pleural effusion    Hypernatremia    Thrombocytopenia    Cirrhosis    (CMS-HCC)    A-fib (CMS-HCC)    Hepatic encephalopathy    (CMS-HCC)        Neuro:    AMS due to hepatic encephalopathy, sepsis, metabolic derangements  Subacute progression of somnolence, fatigue, confusion after discharge 10/3. Presented for outpatient DCCV and given propofol  with difficulty arousing post-procedure, found to have hypercarbia. Upon further work up for AMS was found to have ammonia 192 (31 one week prior), Na 146, pCO2 86. She was placed on NIPPV for ventilation and given lactulose  enemas for HE. Hypercarbia corrected but mental status remains poor with inability to protect airway off BiPAP, ammonia up-trending to 251 despite lactulose  enema. Proceeded with intubation for airway protection and OG placement for enteral lactulose . Will complete evaluation for decompensated cirrhosis as below, but highest suspicion that her acute infection is the driving factor here.   - intubation for airway protection  - lactulose  q4h for HE    Analgosedation  Given mechanical ventilation  - propfol gtt for easy on/off to monitor mental status  - hold opiates for now    Pulmonary:    Acute on chronic Hypercarbic respiratory failure (improving)  Obstructive sleep apnea (chronically untreated)  Mechanical ventilation  In the setting of untreated OSA, progression of AMS led to hypercarbic respiratory failure. Initial ABG 7.28/69/86. Corrected with use of continuous BiPAP overnight with subsequent mild alkalosis due to chronic renal compensation. Given inability to protect airway, proceeded with intubation and mechanical ventilation. Will trend BG and place arterial line as able.   - crit care VBG q8h, transition to ABG once arterial line placed    Vent Mode: PRVC  FiO2 (%): 40 %  S RR: 20  S VT: 450 mL  PEEP: 5 cm H20     Pleural effusions, pulmonary edema  Holding home diuretics given severe sepsis. FiO2 requirements minimal at 40%. Monitor oxygenation closely given large volume IVF for sepsis.  - hold home diuretic     CV:    Septic shock due to gram negative bacteremia  Soft BP and elevated lactate to 6. BP have normalized with sepsis fluids. Anticipate clearance of lactate will be delayed in the setting of cirrhosis. NE order placed in case of further hypotension.   - norepinephrine available to maintain MAP >65  - place arterial line when able for hemodynamic monitoring    Presumed HFpEF  Chronic HTN  No diastolic dysfunction on TTE 9/30 but presumed HFpEF given consistent clinical syndrome of volume overload and long-standing HTN, LVEF >55%. Recently discharged on spironolactone and lasix as below, which are held.   - hold home spironolactone 25mg  daily  - hold home lasix     Atrial fibrillation, rate controlled  Left atrial thrombus  New diagnosis, rate controlled on Coreg . Planned for DCCV but found to have LA thrombus thus aborted. On treatment dose anticoagulation at home.   - hold home coreg  3.25mg  BID   - hold home apixaban 5mg  BID given bleeding per rectum     Renal:    AKI  Creatinine unreliable given her sarcopenia and cirrhosis. Cystatin c 1.36 on 10/1 and 1.9 (eGFR 29) on admission. Poor UOP at home and minimal UOP with Foley overnight. Presumed pre-renal in the setting of AMS and poor PO in addition to sepsis.   - s/p sepsis dose fluids  - monitor UOP with strict I/O, Foley in place  - daily cystatin c  - avoid nephrotoxins      Infectious disease:    Sepsis due to GNR bacteremia  Presented with AMS, found to have decompensated cirrhosis thus infectious work up performed as below. No fever at home although hypothermic on admission. No signs or symptoms to point to a particular etiology based on family interview. Bcx on admission with 2/2 GNR, preliminary PCR growing E coli and enterbacteriaceae spp. Start Zosyn and resuscitate with 30cc/kg IVF. Await further susceptibilities.   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 History of G1 esophageal varices but no recent EGD. Not on lactulose  at home, no ascites on exam or POCUS, only recently started diuretics for HFpEF as above 10/3. Infectious work up as above, positive for GNR bacteremia. Intubated for airway protection given severity of HE in the setting of sepsis.   - appreciate [facility] GI consult  - Hepatology consult once arrives at [facility]  - MELD labs daily  - Follow up liver doppler   - strict I/O  - hold home diuretics given sepsis  - start enteral lactulose  q4h, titrating for 3-5 BM daily  - holding home beta blocker coreg  3.25mg  BID    MELD 3.0: 17 at 03/11/2024  3:16 PM  MELD-Na: 14 at 03/11/2024  3:16 PM  Calculated from:  Serum Creatinine: 0.82 mg/dL (Using min of 1 mg/dL) at 89/89/7974  6:83 PM  Serum Sodium: 149 mmol/L (Using max of 137 mmol/L) at 03/11/2024  3:16 PM  Total Bilirubin: 2.1 mg/dL at 89/89/7974  6:83 PM  Serum Albumin: 2.2 g/dL at 89/89/7974  6:83 PM  INR(ratio): 1.61 at 03/11/2024  3:15 PM  Age at listing (hypothetical): 78 years  Sex: Female at 03/11/2024  3:16 PM    Bleeding per rectum  Two episodes of maroon and later pink-tinged stool after receiving lactulose  enemas. Reassuringly non-tachycardic and Hg stable. Discussed with GI and will discontinue octreotide gtt. Continue IV PPI BID, q8h H/H and maintain active T/S.   - obtain central access  - maintain active type & screen   - Follow the patient's Hgb every 8 hours and transfuse for Hgb <7   - consent at bedside  - Pantoprazole 40mg  IV BID  - hold apixaban 5mg  BID  - NPO  - appreciate GI consult, cirrhosis management as above    Hem/onc:    Coagulopathy, thrombocytopenia  Chronic tpenia iso cirrhosis, stable ~90-100. Will obtain DIC panel and trend coags q8h given critical illness.   - CBC and coags q8h    LA Thrombus  In the setting  of atrial fibrillation as above. Diagnosed on TEE 10/9.   - hold apixaban 5mg  BID given c/f GIB    History of R HR+/HER2 low IDC breast cancer  S/p partial mastectomy and radiation 2022. On anastrozole   - continue anastrozole  1mg  daily    Malnutrition:  -Body mass index is 32.15 kg/m??.  Lab Results   Component Value Date    PROT 5.0 (L) 03/11/2024    ALBUMIN 2.2 (L) 03/11/2024       The patient's presentation is complicated by the following clinically significant conditions requiring additional evaluation and treatment: - Disorders of electrolytes, volume status, and acid/base status: - Acidosis, - Hypernatremia, - Hypotension, and - Volume depletion/hypovolemia requiring further investigation, treatment, or monitoring  - Thrombocytopenia requiring further investigation or monitor  - overweight/obese/morbidly obese: obese POA affecting nursing needs, DME, and attention to weight based medication dosing -- Body mass index is 32.15 kg/m??.   - Hypercoagulable state requiring additional attention to DVT prophylaxis and treatment or chronic anticoagulation secondary to Atrial Fibrillation   - Bleeding in the setting of medications including at least one of the following: DOAC, warfarin, clopidogrel, dual antiplatelet therapy, or other anticoagulant therapy causing medication-induced coagulopathy POA requiring further treatment, investigation or monitoring  - Altered mental status secondary to - Metabolic Encephalopathy POA requiring further investigation or monitoring  - Chronic kidney disease POA requiring further investigation, treatment, or monitoring   - Chronic heart failure POA requiring further investigation, treatment, or monitoring  - Age related debility POA requiring additional resources: DME, PT, or OT    Issues Impacting Complexity of Management:  -Discussion of risks/benefits of central venous catheterization, arterial line placement, endotracheal intubation with the patient/family including risk of risk factor: hemorrhage, infection, medical complications, anesthetic complications, and post-operative complications such as pulmonary embolus.  -Intensive monitoring of drug toxicity from Zosyn with scheduled BMP  -Need for escalation to higher hospital-level of care from [facility] to [facility] for access to specialized services and increased monitoring  -High risk of complications from pain and/or analgesia likely to result in delirium  -The patient is at high risk from Hospital immobility in an elderly patient given baseline poor functional status with a high risk of causing delirium and further decline in function  -The patient is at high risk for the development of complications of volume overload due to the need to provide IV hydration for suspected hypovolemia in the setting of: heart failure and Cirrhosis  -Need for the following intensive monitoring parameter(s) due to high risk of clinical decline: scheduled neuro checks, continuous oxygen monitoring, telemetry, and scheduled monitoring of NGT output to monitor for return of bowel function  -Need for intensive oxygen therapy of mechanical ventilation, which places the patient at high risk for oxygen toxicityfor oxygen toxicity      Medical Decision Making: Discussed the patient's management and/or test interpretation with Gastroenterology, Pulmonology, and Cardiology as summarized within this note      Daily Checklist:  Diet: NPO  DVT PPx: Contraindicated - High Risk for Bleeding/Active Bleeding  Electrolytes: Replete Potassium to >/=4 and Magnesium  to >/=2  Code Status: Full Code  Dispo: Transfer to MICU    Team Contact Information:   Primary Team: Internal Medicine (MEDM)  Primary Resident: [name] JONELLE Coin, MD, MD  Resident's Pager: [phone] [facility] Senior Res[facility])    Interval History:     Progressive worsening of mental status this morning requiring intubation. Discussed her care extensively at the bedside with her brother  [name] and daughter [name]. See ACP note from 10/10 and problem based assessment as above.     Objective:   Temp:  [34.1 ??C (93.4 ??F)-37.3 ??C (99.1 ??F)] 36.1 ??C (97 ??F)  Pulse:  [66-107] 80  SpO2 Pulse:  [67-104] 79  Resp:  [11-27] 18  BP: (78-165)/(44-103) 97/44  FiO2 (%):  [28 %-40 %] 40 %  SpO2:  [83 %-100 %] 100 %    GEN: ill appearing with labored breathing, does not open eyes to voice or tactile stimulation  ENT: bipap mask in place  CV: Regular rate, irregular rhythm. Extremities warm and without edema.  PULM: Clear to auscultation bilaterally in anterior fields. Extremely poor respiratory effort when bipap mask removed.   ABD: Hypoactive bowel sounds with stable distention from prior. Does not react to palpation.   EXT: No edema  NEURO/PSYCH: Not able to participate in exam. Does not arouse to voice or touch. Concerns for airway protection.     [name] Eliberto Coin, MD MPH  PGY-2 Internal Medicine  [facility]

## 2024-03-11 NOTE — Unmapped (Signed)
 [facility] Gastroenterology Consult Service   Initial Consultation         Assessment and Recommendations:   [name] is a 78 y.o. 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 The patient is seen in consultation at the request of [name] Jackquline Schiller, MD (Medical ICU (MDI)) for decompensated cirrhosis.    MetALD Cirrhosis - Concern for Hepatic Encephalopathy - MELD-Na 15  [name] has a history of decompensated MetALD cirrhosis with HE and Grade I esophageal varices who presented to [facility] for TEE/DCCV (DCCV not performed due to presence of LA thrombus) whose post-procedural course was complicated by acute hypercarbic respiratory failure, initially requiring BiPAP. Her hypercarbia was corrected (86-->58) on BiPAP, though she remained encephalopathic and ultimately needed intubation this morning. The differential for her ongoing encephalopathy includes HE (has a history of this though was not on lactulose  prior to admission), new portosystemic shunt, infection (blood cultures with GNR), stroke (thrombus present in LAA, was on Eliquis prior to admission), toxic metabolic (hypercarbia, currently hypernatremic to 151), medication-related (ongoing effects from propofol ). We recommend ongoing administration of lactulose  and recommend CT to investigate for potential shunt that could be contributing to HE. Prefer administration via NG as opposed to enemas which are likely exacerbating her hematochezia and hypernatremia. We also recommend ruling out alternative etiologies with head CT and treatment of bacteremia.     Hematochezia - Known G1EV on EGD 2019  This morning, patient noted have brown stool followed by maroon stool with bright pink blood tinging the pad below. No hematemesis. She has remained HD stable with most recent HR 82 and BP 117/53. She is not on pressors. Last upper endoscopy 01/2018 significant for grade I esophageal varices and portal hypertensive gastropathy. She was initiated on PPI, ceftriaxone, and octreotide this morning by primary team. Overall, we have a low suspicion for variceal bleeding given her HD stability and stable hemoglobin. Her bleeding may be related to rectal trauma from enemas or alternative lower GI bleeding source. We are comfortable discontinuing octreotide. No contraindications to placing NG tube.     GN Bacteremia   On 2/2 blood cultures 10/10. Suspect GI source. CT ap pending. Initially on Ceftriaxone it the setting of possible GI, now broadened to Zosyn.    Recommendations:   -- Obtain non-contrast head CT for evaluation of AMS  -- Obtain non-contrast CT a/p to evaluate for source of GN bacteremia, HCC screening, portosystemic shunt. Unable to obtain contrasted scan due to renal function.  -- Okay with placement of NG tube placement. Once NG tube placed, start lactulose  30 grams every 6 hours with goal 3-5 BM/day and stop lactulose  enemas.  -- Correct hypernatremia   -- Due to low concern for variceal bleed, okay to stop octreotide  -- Continue Zosyn GN bacteremia   -- Transition from BID PPI to once daily PPI for stress ulcer ppx  -- Daily MELD labs  -- Hepatology team at [facility] are aware of patient    Issues Impacting Complexity of Management:  -None    Patient was discussed with Dr. Maree with [facility]. Recommendations discussed with the patient's primary team. We will continue to follow along with you.    Subjective:   [name] was admitted to [facility] 9/29 -  10/3 found to have new HFpEF and atrial fibrillation.  She was diuresed with IV Lasix and started on carvedilol  and Eliquis.  During that admission, there were some issues with memory of recent events, but overall low concern for hepatic encephalopathy.  She had been prescribed lactulose  previously, but was not taking it prior to admission.  She followed up in A-fib transitions clinic on 10/6 and plan was for TEE with cardioversion.  She presented yesterday 10/9 for TEE-DCCV.  She received 30 mg of propofol  for sedation though DCCV was not performed due to visualization of a left atrial thrombus.  Her postprocedure course was complicated by persistent somnolence and acute hypercarbic respiratory failure for which she was admitted. She was started on continuous BIPAP with improvement in her hypercarbia from 86 to 58 on most recent blood gas.  Ammonia noted to be elevated to 192 previously 31 when admitted in September.now 251.  She was started on lactulose  enemas 200 g every 6 hours yesterday evening and has received 3 doses thus far.  She had 3 documented bowel movements yesterday and 1 so far today.     In regards to an infectious workup, chest x-ray showing mild interstitial edema with bilateral pleural effusions.  UA negative for nitrites, leukocyte esterase and one white blood cells.  Blood cultures pending.  No tappable pocket for paracentesis. Abdominal U/S without evidence of main portal vein thrombus on limited views, though exam overall limited.    She was noted to have pink tinge stools noted on the chux this morning and so was started on ceftriaxone, PPI, octreotide.  Last EGD 01/2018 showed grade 1 varices in the lower third of the esophagus, Schatzki's ring at the GE junction, 2 cm hiatal hernia, portal hypertensive gastropathy and normal duodenum.    Her brother was at bedside this morning who said the last time he saw her two weeks ago, she was in her normal state of health. Per H&P, daughter reported excessive sleeping at home, more somnolent, and more confused (verbal, but not clear where she is/what is happening).    Objective:   Temp:  [34.1 ??C (93.4 ??F)-37.3 ??C (99.1 ??F)] 36.4 ??C (97.5 ??F)  Pulse:  [63-107] 94  SpO2 Pulse: [63-104] 94  Resp:  [9-27] 20  BP: (79-165)/(45-100) 117/53  FiO2 (%):  [3 %-40 %] 40 %  SpO2:  [83 %-100 %] 94 %    Gen: Elderly female, intubated and sedated  Pulm: Intubated at 40% FiO2  Abdomen: Soft, NTND, no rebound/guarding, no hepatosplenomegaly  Extremities: No edema in the BLEs   Skin: Scattered angiomas on anterior chest   Neuro: Not responding to commands    Pertinent Labs & Studies:  -I have reviewed the patient's labs from 03/11/24 which show stable Hgb and worsening hypernatremia     MELD 3.0: 17 at 03/11/2024  6:14 AM  MELD-Na: 15 at 03/11/2024  6:14 AM  Calculated from:  Serum Creatinine: 0.82 mg/dL (Using min of 1 mg/dL) at 89/89/7974  3:85 AM  Serum Sodium: 151 mmol/L (Using max of 137 mmol/L) at 03/11/2024  6:14 AM  Total Bilirubin: 2.3 mg/dL at 89/89/7974  3:85 AM  Serum Albumin: 2.5 g/dL at 89/89/7974  3:85 AM  INR(ratio): 1.59 at 03/11/2024  6:14 AM  Age at listing (hypothetical): 78 years  Sex: Female at 03/11/2024  6:14 AM

## 2024-03-11 NOTE — Unmapped (Signed)
 MICU Evening Summary     Date of Service: 03/11/2024    Interval History: [name] is a 78 y.o. female with 22F hx HFpEF, HTN, Afib on AC, MAFLD Cirrhosis, present for Sch TEE/DCCV (Aborted 2/2 LAA Clot), admitted for Acute Hypoxic/Hypercarbic Respiratory Failure + HE/TME . Critical care services are indicated for:    Principal Problem:    Bacteremia, escherichia coli  Active Problems:    Alcoholic liver disease (HHS-HCC)    HTN (hypertension)    Depression with anxiety    Class 2 severe obesity with serious comorbidity in adult    Atrial fibrillation    (CMS-HCC)    Pleural effusion    Hypernatremia    Thrombocytopenia    Cirrhosis    (CMS-HCC)    A-fib (CMS-HCC)    Hepatic encephalopathy    (CMS-HCC)      Overnight events:   - new line placed  - vent adjusted for alkalosis.  TV and RR lowered.    - cryo transfused   - lactulose  started. No BM yet.   - additional liter LR given for continued pressors and pulse pressure variation. Very collapsible on pocus    Assessment & Plan   Neuro   - low dose prop + PRN fent   - lactulose      Resp:   - AHRF from hypoventilation/encephalopathy   - ETT 10/10     CV:  hlo HFpEF, afib/LAA thrombus   - hold home GDMT, torsemide, coreg , aldactone   - low dose levo   - hold eliquis w/ GIB     GI: MetALD chirrosis, hematochezia     GU   - foley   - D5 @ 50 for hypernatremia     Heme:   - hold ppx and home eliquis     Endo:   - nAI        Critical Care Attestation     This patient is critically ill or injured with the impairment of vital organ systems such that there is a high probability of imminent or life threatening deterioration in the patient's condition. This patient must remain in the ICU for ongoing evaluation of the comprehensive management plan outlined in this note. I directly provided critical care services as documented in this note and the critical care time spent (45 min) is exclusive of separately billable procedures.  In addition to time spent for critical care management, I also provided advance care planning services for 0 minutes (see GOC above or ACP note for details)???. Total billable critical care time 45 minutes.    Angelyse Heslin JONELLE Lux, PA

## 2024-03-11 NOTE — Unmapped (Signed)
 MICU H&P     Date of Service: 03/11/2024    Problem List:   Principal Problem:    Atrial fibrillation    (CMS-HCC)  Active Problems:    Alcoholic liver disease (HHS-HCC)    HTN (hypertension)    Class 2 severe obesity with serious comorbidity in adult    A-fib (CMS-HCC)      Transfer Summary: [name] is a 78 y.o. 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 CCU after being difficult to arouse from sedation following elective DCCV aborted due to discovery of LAA thrombus. Found to have acute hypercarbic respiratory failure, acute hepatic encephalopathy, and hypothermia following procedure, prompting intubation. Transferred to MICU for further management.       Neurological   Acute Toxic Metabolic Encephalopathy  Concern for Hepatic Encephalopathy  S/p Procedural Sedation  Patient presented for scheduled TEE/DCCV with Cardiology on 10/10, received 50 mg propofol  and found to be difficult to wake following sedation with hypercarbic respiratory failure. Per chart review, initially poorly responsive to stimulation and started on NIPPV with BiPAP, hypercarbia improved but remained altered eventually requiring intubation as below. Ddx for ongoing encephalopathy includes toxic metabolites 2/2 hypercarbia and electrolyte abnormalities, HE (not on lactulose ), infection/sepsis, ongoing sedation 2/2 pre-procedural propofol .  - Sedated on propofol  gtt  - PRN fentanyl  for analgesia  - Management of HE as below    Analgesia: Pain adequately controlled  RASS at goal? 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 Found to have ABG w/ pH 7.28 / pCO2 86 / pO2 69. Transferred to CCU w/ pulm consult and placed on NIPPV. CXR with slight pulmonary edema, diuresed 1x w/ IV Lasix 40 mg. Hypercarbia corrected on BiPAP then AVAPS from 86 > 58 but ultimately required intubation due to acute hypoxic respiratory failure from hypoventilation likely due to encephalopathy.   - fairly minimal vent support     S RR:  [20] 20  FiO2 (%):  [25 %-40 %] 40 %  S VT:  [450 mL] 450 mL  O2 Device: Ventilator  O2 Flow Rate (L/min):  [3 L/min] 3 L/min    Cardiovascular   Hypotension likely 2/2 sedation +/- infection  - s/p 2L fluid today, on low dose norepi for SBP>65    Chronic HFpEF - HTN  Discharged 03/04/24 after several weeks worsening BLE edema, orthopnea and dyspnea, w/ Pro-BNP 850 and HFpEF on TTE. Dry weight 195 lbs. Established care w/ Cardiology since discharge, currently on diuretic PRN, MRA, coreg  at home. On admission, pro-BNP 520, slightly elevated from 490 at previous discharge but improved from 846 on 02/29/24. CXR with interstitial edema and bilateral pulmonary effusions. Diuresed x 1 with IV lasix 40 mg with ~1.5L UOP on 10/9. Given 2L LR at OSH for sepsis on 10/10.  - HOLD home GDMT in setting of acute infection:              - SGLT2i: Farxiga 47$/month - consider as outpatient   - BB: Coreg  3.125 mg BID              - MRA: Aldactone 25 mg daily   - Diuretics: Torsemide PRN  -  Daily Weights  - Strict I/Os     #Atrial Fibrillation - Left Atrial Appendage Thrombus (03/10/24)  New onset (03/2024), continued on home Coreg  3.125 mg BID. Started on Eliquis (cost-prohibitive due to not meeting deductible). Attempted TEE DCCV however aborted due to LAA thrombus.  - HOLD Eliquis 5 mg BID in setting of c/f GIB  - HOLD Coreg  BID in setting of acute infection    Renal   AKI  Baseline Cr ~0.4. Creatinine elevated to 0.66 on admission.  - Strict I/Os  - Trend BMPs    Infectious Disease/Autoimmune   Sepsis  Afebrile upon arrival to scheduled procedure via axillary temp, but found to be hypothermic to 34.1C via foley catheter upon admission to OSH. Normotensive, non-tachycardic, without leukocytosis at the time. Hypothermia improved with Bair Hugger. Eventually sedated and intubated as above for acute hypoxic respiratory failure and encephalopathy. No fluid pocket for diagnostic paracentesis on POCUS. CXR with interstitial edema and bilateral pulmonary effusions but no focal consolidation. UA noninfectious. Blood cultures x 2 found to be positive for E. Coli. Lactate increased from 2 to 6.2 on recheck. MAPs have remained > 65, not on pressors. Started on Zosyn at OSH. Ddx for etiology GI translocation vs SBP.  - Continue Zosyn  - has GNR in blood                 FEN/GI   MetALD Cirrhosis - Hx Hepatic Encephalopathy  History of decompensated metabolic-associated cirrhosis with HE, G1 EV and PHG on EGD 01/2018. Presented altered and encephalopathic following sedation as above. Ammonia level elevated 192 > 251 from previous level of 30. Liver doppler US  negative for acute thrombosis. Not on lactulose  prior to admission. Had been receiving lactulose  enemas at OSH. Concern for development of GI bleed on 10/10 AM as below, GI consulted at OSH with low suspicion for variceal bleed, recommend placing NG tube for lactulose  administration to prevent further bleed.  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 No hematemesis. HDS, not on pressors, Hgb stable. S/p PPI, ceftriaxone, and octreotide for GIB PPX. Per GI consult at OSH, low suspicion for variceal bleeding given HD stability and stable hemoglobin. Suspect related to rectal trauma from enemas or alternative lower GI bleeding source.  - IV PPI  - On Zosyn so can hold on further ceftriaxone PPX  - Monitor for further hematochezia  - Trend CBC  - 1u cryo 10/10    Provider Malnutrition Assessment:  Body mass index is 32.15 kg/m??.BMI Interpretation: >/= 30 and < 40, consistent with obesity, clinically significant requiring additional resources and complicating multiple aspects of patient care.  GLIM criteria:   Pt does not meet criteria  -I have screened this patient for malnutrition and they did NOT meet criteria for malnutrition based on GLIM criteria.  -Nutrition consulted no  RD assessment:Not done yet.           Heme/Coag   - hold home eliquis and dvt prophy    Endocrine   - NAI    Prophylaxis/LDA/Restraints/Consults   ICU Checklist completed: yes (see ICU rounding navigator in Epic)      Patient  Lines/Drains/Airways Status       Active Active Lines, Drains, & Airways       Name Placement date Placement time Site Days    ETT  7.5 03/11/24  0915  -- less than 1    Urethral Catheter 03/10/24  1800  --  less than 1    Peripheral IV 03/10/24 Left;Posterior Hand 03/10/24  0832  Hand  1    Peripheral IV 03/11/24 Left Antecubital 03/11/24  0907  Antecubital  less than 1                  Patient Lines/Drains/Airways Status       Active Wounds       Name Placement date Placement time Site Days    Wound 03/01/24 Other (Comment) Toe (Comment which one) Left;Other (Comment) Blister present on arrival, tx already in outpatient setting, in process of healing, surrounding erythema 03/01/24  0129  Toe (Comment which one)  10                    Goals of Care     Code Status:   Orders Placed This Encounter   Procedures    Full Code     Standing Status:   Standing     Number of Occurrences:   1        Designated Healthcare Decision Maker:  Ms. Idrovo designated healthcare decision maker(s) is/are   HCDM (patient stated preference): Winget,[name] - Spouse - [phone]    HCDM, First AlternateDarrin, [place] - Daughter - [phone]. See HCDM section of Epic sidebar/storyboard or ACP tab in patient chart for details regarding active HCDMs and patient capacity for decision-making.      Subjective     Intubated, sedated    Objective     Vitals - past 24 hours  Temp:  [34.1 ??C (93.4 ??F)-37.3 ??C (99.1 ??F)] 36.3 ??C (97.3 ??F)  Pulse:  [64-107] 87  SpO2 Pulse:  [63-104] 86  Resp:  [9-27] 16  BP: (78-165)/(45-100) 95/48  FiO2 (%):  [25 %-40 %] 40 %  SpO2:  [83 %-100 %] 100 % Intake/Output  I/O last 3 completed shifts:  In: 140 [I.V.:140]  Out: 2250 [Urine:2250]     Physical Exam:    General: chronically ill appearing, intubated  HEENT: dry mucous membranes  CV: normal rate, irreg rhythm, no m/r/g appreciated  Pulm: diminished bilaterally but clear  GI: soft, distended, non-tender  MSK: BLE edema present  Skin: scattered bruising throughout, fragile  Neuro: no focal deficits, sedated       Continuous Infusions:   Infusions Meds[1]    Scheduled Medications:   Scheduled Medications[2]    PRN medications:  PRN Medications[3]    Data/Imaging Review: Reviewed in Epic and personally interpreted on 03/11/2024. See EMR for detailed results.       Critical Care Attestation     This patient is critically ill or injured with the impairment of vital organ systems such that there is a high probability of imminent or life threatening deterioration in the patient's condition. This patient must remain in the ICU for ongoing evaluation of the comprehensive management plan outlined in this note. I directly provided critical care services as documented in this note and the critical care time spent (50 min) is exclusive of separately billable procedures.  In addition to time spent for critical care management, I also provided advance care planning services for 0 minutes (see GOC above or ACP note for details)???.  Total billable critical care time 50 minutes.    Barrie Sigmund S Ardelia Wrede, ACNP           [1]    dextrose  50 mL/hr (03/11/24 1200)    NORepinephrine bitartrate-NS      octreotide infusion 50 mcg/hr (03/11/24 1200)    propofol  10 mg/mL infusion 40 mcg/kg/min (03/11/24 1212)   [2]    anastrozole   1 mg Oral Daily    [Provider Hold] carvedilol   3.125 mg Oral BID    cyanocobalamin (vitamin B-12)  1,000 mcg Oral Daily    folic acid   1 mg Oral Daily    lactated ringers   1,000 mL Intravenous Once    lactulose   20 g Oral Q4H    magnesium  oxide  400 mg Oral BID    melatonin  3 mg Oral QPM    NORepinephrine bitartrate-D5W        pantoprazole (Protonix) intravenous solution  40 mg Intravenous BID    piperacillin-tazobactam (ZOSYN) IV (intermittent)  4.5 g Intravenous Once    And    piperacillin-tazobactam (ZOSYN) IV (intermittent)  4.5 g Intravenous Q6H    [Provider Hold] spironolactone  25 mg Oral Daily    thiamine  mononitrate (vit B1)  100 mg Oral Daily   [3] acetaminophen , docusate sodium, fentaNYL  (PF) **OR** fentaNYL  (PF), haloperidol LACTATE, NORepinephrine bitartrate-D5W, ondansetron , polyethylene glycol

## 2024-03-11 NOTE — Unmapped (Signed)
 Intubation Procedure      Urgent Intubation         Pre O2/mask induction:    PAP      Intubation technique:    Video Laryngoscopy    D laryngoscope blade and 7.6mm cuffed endotracheal tube    Backup intubation supplies:    Yes      Placement assessment:     25 cm at lips      Insertion attempts:     1      Intubation complications:    No Complications Noted      Placement confirmed by:    EtCo2, Auscultation, and Chest rise      Placed By:  Brad JINNY Merritt, CRT     Intubation procedure comments:  Pt intubated 7.5 ETT secured 25 at the lip. BBS equal throughout, positive ETCO2. No complications noted. Pt placed on ventilator with settings of PRVC 450/R20/+5/40%. Pt tolerating well. RT Will continue to follow.

## 2024-03-12 LAB — BLOOD GAS CRITICAL CARE PANEL, ARTERIAL
BASE EXCESS ARTERIAL: 10 — ABNORMAL HIGH (ref -2.0–2.0)
BASE EXCESS ARTERIAL: 6.1 — ABNORMAL HIGH (ref -2.0–2.0)
BASE EXCESS ARTERIAL: 6.3 — ABNORMAL HIGH (ref -2.0–2.0)
BASE EXCESS ARTERIAL: 7.8 — ABNORMAL HIGH (ref -2.0–2.0)
BASE EXCESS ARTERIAL: 8.7 — ABNORMAL HIGH (ref -2.0–2.0)
BASE EXCESS ARTERIAL: 8.7 — ABNORMAL HIGH (ref -2.0–2.0)
CALCIUM IONIZED ARTERIAL (MG/DL): 3.98 mg/dL — ABNORMAL LOW (ref 4.40–5.40)
CALCIUM IONIZED ARTERIAL (MG/DL): 4.19 mg/dL — ABNORMAL LOW (ref 4.40–5.40)
CALCIUM IONIZED ARTERIAL (MG/DL): 4.65 mg/dL (ref 4.40–5.40)
CALCIUM IONIZED ARTERIAL (MG/DL): 4.69 mg/dL (ref 4.40–5.40)
CALCIUM IONIZED ARTERIAL (MG/DL): 4.82 mg/dL (ref 4.40–5.40)
CALCIUM IONIZED ARTERIAL (MG/DL): 4.84 mg/dL (ref 4.40–5.40)
CARBOXYHEMOGLOBIN: 1.3 % — ABNORMAL HIGH (ref <1.2)
CARBOXYHEMOGLOBIN: 1.3 % — ABNORMAL HIGH (ref <1.2)
CARBOXYHEMOGLOBIN: 1.7 % — ABNORMAL HIGH (ref <1.2)
CARBOXYHEMOGLOBIN: 1.9 % — ABNORMAL HIGH (ref <1.2)
CARBOXYHEMOGLOBIN: 1.9 % — ABNORMAL HIGH (ref <1.2)
CARBOXYHEMOGLOBIN: 2 % — ABNORMAL HIGH (ref <1.2)
CHLORIDE, WHOLE BLOOD: 105 mmol/L (ref 98–107)
CHLORIDE, WHOLE BLOOD: 105 mmol/L (ref 98–107)
CHLORIDE, WHOLE BLOOD: 106 mmol/L (ref 98–107)
CHLORIDE, WHOLE BLOOD: 112 mmol/L — ABNORMAL HIGH (ref 98–107)
CHLORIDE, WHOLE BLOOD: 112 mmol/L — ABNORMAL HIGH (ref 98–107)
GLUCOSE WHOLE BLOOD: 101 mg/dL (ref 70–179)
GLUCOSE WHOLE BLOOD: 137 mg/dL (ref 70–179)
GLUCOSE WHOLE BLOOD: 144 mg/dL (ref 70–179)
GLUCOSE WHOLE BLOOD: 148 mg/dL (ref 70–179)
GLUCOSE WHOLE BLOOD: 155 mg/dL (ref 70–179)
GLUCOSE WHOLE BLOOD: 158 mg/dL (ref 70–179)
HCO3 ARTERIAL: 30 mmol/L — ABNORMAL HIGH (ref 22–27)
HCO3 ARTERIAL: 30 mmol/L — ABNORMAL HIGH (ref 22–27)
HCO3 ARTERIAL: 32 mmol/L — ABNORMAL HIGH (ref 22–27)
HCO3 ARTERIAL: 33 mmol/L — ABNORMAL HIGH (ref 22–27)
HCO3 ARTERIAL: 33 mmol/L — ABNORMAL HIGH (ref 22–27)
HCO3 ARTERIAL: 35 mmol/L — ABNORMAL HIGH (ref 22–27)
HEMOGLOBIN BLOOD GAS: 11.9 g/dL — ABNORMAL LOW (ref 12.00–16.00)
HEMOGLOBIN BLOOD GAS: 12.6 g/dL (ref 12.00–16.00)
HEMOGLOBIN BLOOD GAS: 12.9 g/dL (ref 12.00–16.00)
HEMOGLOBIN BLOOD GAS: 13.2 g/dL (ref 12.00–16.00)
HEMOGLOBIN BLOOD GAS: 13.5 g/dL (ref 12.00–16.00)
HEMOGLOBIN BLOOD GAS: 14.6 g/dL (ref 12.00–16.00)
LACTATE BLOOD ARTERIAL: 1.9 mmol/L — ABNORMAL HIGH (ref <1.3)
LACTATE BLOOD ARTERIAL: 2.4 mmol/L — ABNORMAL HIGH (ref <1.3)
LACTATE BLOOD ARTERIAL: 2.5 mmol/L — ABNORMAL HIGH (ref <1.3)
LACTATE BLOOD ARTERIAL: 2.7 mmol/L — ABNORMAL HIGH (ref <1.3)
LACTATE BLOOD ARTERIAL: 2.7 mmol/L — ABNORMAL HIGH (ref <1.3)
LACTATE BLOOD ARTERIAL: 2.8 mmol/L — ABNORMAL HIGH (ref <1.3)
METHEMOGLOBIN: <1 % (ref <1.5)
METHEMOGLOBIN: <1 % (ref <1.5)
METHEMOGLOBIN: <1 % (ref <1.5)
METHEMOGLOBIN: <1 % (ref <1.5)
METHEMOGLOBIN: <1 % (ref <1.5)
METHEMOGLOBIN: <1 % (ref <1.5)
O2 SATURATION ARTERIAL: 97 % (ref 94.0–100.0)
O2 SATURATION ARTERIAL: 97.6 % (ref 94.0–100.0)
O2 SATURATION ARTERIAL: 97.7 % (ref 94.0–100.0)
O2 SATURATION ARTERIAL: 97.8 % (ref 94.0–100.0)
O2 SATURATION ARTERIAL: 98.3 % (ref 94.0–100.0)
O2 SATURATION ARTERIAL: 98.7 % (ref 94.0–100.0)
OXYHEMOGLOBIN: 94.5 % (ref 94.0–100.0)
OXYHEMOGLOBIN: 95.2 % (ref 94.0–100.0)
OXYHEMOGLOBIN: 95.8 % (ref 94.0–100.0)
OXYHEMOGLOBIN: 96 % (ref 94.0–100.0)
OXYHEMOGLOBIN: 96.3 % (ref 94.0–100.0)
OXYHEMOGLOBIN: 97 % (ref 94.0–100.0)
PCO2 ARTERIAL: 39.7 mmHg (ref 35.0–45.0)
PCO2 ARTERIAL: 41 mmHg (ref 35.0–45.0)
PCO2 ARTERIAL: 43.7 mmHg (ref 35.0–45.0)
PCO2 ARTERIAL: 45 mmHg (ref 35.0–45.0)
PCO2 ARTERIAL: 45.3 mmHg — ABNORMAL HIGH (ref 35.0–45.0)
PCO2 ARTERIAL: 47.5 mmHg — ABNORMAL HIGH (ref 35.0–45.0)
PH ARTERIAL: 7.46 — ABNORMAL HIGH (ref 7.35–7.45)
PH ARTERIAL: 7.47 — ABNORMAL HIGH (ref 7.35–7.45)
PH ARTERIAL: 7.48 — ABNORMAL HIGH (ref 7.35–7.45)
PH ARTERIAL: 7.48 — ABNORMAL HIGH (ref 7.35–7.45)
PH ARTERIAL: 7.49 — ABNORMAL HIGH (ref 7.35–7.45)
PH ARTERIAL: 7.49 — ABNORMAL HIGH (ref 7.35–7.45)
PO2 ARTERIAL: 103 mmHg (ref 80.0–110.0)
PO2 ARTERIAL: 103 mmHg (ref 80.0–110.0)
PO2 ARTERIAL: 85.1 mmHg (ref 80.0–110.0)
PO2 ARTERIAL: 87.1 mmHg (ref 80.0–110.0)
PO2 ARTERIAL: 88.2 mmHg (ref 80.0–110.0)
PO2 ARTERIAL: 97.2 mmHg (ref 80.0–110.0)
POTASSIUM WHOLE BLOOD: 2.7 mmol/L — ABNORMAL LOW (ref 3.4–4.6)
POTASSIUM WHOLE BLOOD: 3.4 mmol/L (ref 3.4–4.6)
POTASSIUM WHOLE BLOOD: 3.7 mmol/L (ref 3.4–4.6)
POTASSIUM WHOLE BLOOD: 3.9 mmol/L (ref 3.4–4.6)
POTASSIUM WHOLE BLOOD: 4.1 mmol/L (ref 3.4–4.6)
POTASSIUM WHOLE BLOOD: 4.1 mmol/L (ref 3.4–4.6)
SODIUM WHOLE BLOOD: 142 mmol/L (ref 135–145)
SODIUM WHOLE BLOOD: 143 mmol/L (ref 135–145)
SODIUM WHOLE BLOOD: 143 mmol/L (ref 135–145)
SODIUM WHOLE BLOOD: 144 mmol/L (ref 135–145)
SODIUM WHOLE BLOOD: 145 mmol/L (ref 135–145)
SODIUM WHOLE BLOOD: 145 mmol/L (ref 135–145)

## 2024-03-12 LAB — D-DIMER, QUANTITATIVE
D-DIMER QUANTITATIVE (ACL TOP): 265 ng{FEU}/mL (ref <=500)
D-DIMER QUANTITATIVE (ACL TOP): 341 ng{FEU}/mL (ref <=500)
D-DIMER QUANTITATIVE (ACL TOP): 415 ng{FEU}/mL (ref <=500)

## 2024-03-12 LAB — CBC W/ AUTO DIFF
BASOPHILS ABSOLUTE COUNT: 0.1 10*9/L (ref 0.0–0.1)
BASOPHILS RELATIVE PERCENT: 0.4 %
EOSINOPHILS ABSOLUTE COUNT: 0.1 10*9/L (ref 0.0–0.5)
EOSINOPHILS RELATIVE PERCENT: 0.9 %
HEMATOCRIT: 39 % (ref 34.0–44.0)
HEMOGLOBIN: 13.2 g/dL (ref 11.3–14.9)
LYMPHOCYTES ABSOLUTE COUNT: 1.7 10*9/L (ref 1.1–3.6)
LYMPHOCYTES RELATIVE PERCENT: 12.7 %
MEAN CORPUSCULAR HEMOGLOBIN CONC: 33.8 g/dL (ref 32.0–36.0)
MEAN CORPUSCULAR HEMOGLOBIN: 32.4 pg (ref 25.9–32.4)
MEAN CORPUSCULAR VOLUME: 95.9 fL — ABNORMAL HIGH (ref 77.6–95.7)
MEAN PLATELET VOLUME: 10 fL (ref 6.8–10.7)
MONOCYTES ABSOLUTE COUNT: 1.9 10*9/L — ABNORMAL HIGH (ref 0.3–0.8)
MONOCYTES RELATIVE PERCENT: 14 %
NEUTROPHILS ABSOLUTE COUNT: 9.6 10*9/L — ABNORMAL HIGH (ref 1.8–7.8)
NEUTROPHILS RELATIVE PERCENT: 72 %
PLATELET COUNT: 113 10*9/L — ABNORMAL LOW (ref 150–450)
RED BLOOD CELL COUNT: 4.07 10*12/L (ref 3.95–5.13)
RED CELL DISTRIBUTION WIDTH: 15.9 % — ABNORMAL HIGH (ref 12.2–15.2)
WBC ADJUSTED: 13.4 10*9/L — ABNORMAL HIGH (ref 3.6–11.2)

## 2024-03-12 LAB — COMPREHENSIVE METABOLIC PANEL
ALBUMIN: 2.4 g/dL — ABNORMAL LOW (ref 3.4–5.0)
ALBUMIN: 2.3 g/dL — ABNORMAL LOW (ref 3.4–5.0)
ALBUMIN: 2.1 g/dL — ABNORMAL LOW (ref 3.4–5.0)
ALKALINE PHOSPHATASE: 114 U/L (ref 46–116)
ALKALINE PHOSPHATASE: 111 U/L (ref 46–116)
ALKALINE PHOSPHATASE: 103 U/L (ref 46–116)
ALT (SGPT): 26 U/L (ref 10–49)
ALT (SGPT): 24 U/L (ref 10–49)
ALT (SGPT): 27 U/L (ref 10–49)
ANION GAP: 13 mmol/L (ref 5–14)
ANION GAP: 11 mmol/L (ref 5–14)
ANION GAP: 10 mmol/L (ref 5–14)
AST (SGOT): 38 U/L — ABNORMAL HIGH (ref <=34)
AST (SGOT): 33 U/L (ref <=34)
AST (SGOT): 38 U/L — ABNORMAL HIGH (ref <=34)
BILIRUBIN TOTAL: 2.1 mg/dL — ABNORMAL HIGH (ref 0.3–1.2)
BILIRUBIN TOTAL: 2.1 mg/dL — ABNORMAL HIGH (ref 0.3–1.2)
BILIRUBIN TOTAL: 2.6 mg/dL — ABNORMAL HIGH (ref 0.3–1.2)
BLOOD UREA NITROGEN: 14 mg/dL (ref 9–23)
BLOOD UREA NITROGEN: 14 mg/dL (ref 9–23)
BLOOD UREA NITROGEN: 15 mg/dL (ref 9–23)
BUN / CREAT RATIO: 15
BUN / CREAT RATIO: 13
BUN / CREAT RATIO: 16
CALCIUM: 9.7 mg/dL (ref 8.7–10.4)
CALCIUM: 9 mg/dL (ref 8.7–10.4)
CALCIUM: 8.4 mg/dL — ABNORMAL LOW (ref 8.7–10.4)
CHLORIDE: 100 mmol/L (ref 98–107)
CHLORIDE: 103 mmol/L (ref 98–107)
CHLORIDE: 103 mmol/L (ref 98–107)
CO2: 31 mmol/L (ref 20.0–31.0)
CO2: 33 mmol/L — ABNORMAL HIGH (ref 20.0–31.0)
CO2: 34 mmol/L — ABNORMAL HIGH (ref 20.0–31.0)
CREATININE: 0.96 mg/dL (ref 0.55–1.02)
CREATININE: 1.04 mg/dL — ABNORMAL HIGH (ref 0.55–1.02)
CREATININE: 0.96 mg/dL (ref 0.55–1.02)
EGFR CKD-EPI (2021) FEMALE: 61 mL/min/1.73m2 (ref >=60)
EGFR CKD-EPI (2021) FEMALE: 55 mL/min/1.73m2 — ABNORMAL LOW (ref >=60)
EGFR CKD-EPI (2021) FEMALE: 61 mL/min/1.73m2 (ref >=60)
GLUCOSE RANDOM: 135 mg/dL (ref 70–179)
GLUCOSE RANDOM: 149 mg/dL — ABNORMAL HIGH (ref 70–99)
GLUCOSE RANDOM: 155 mg/dL (ref 70–179)
POTASSIUM: 4.3 mmol/L (ref 3.4–4.8)
POTASSIUM: 4 mmol/L (ref 3.4–4.8)
POTASSIUM: 3.4 mmol/L (ref 3.4–4.8)
PROTEIN TOTAL: 5.5 g/dL — ABNORMAL LOW (ref 5.7–8.2)
PROTEIN TOTAL: 5.3 g/dL — ABNORMAL LOW (ref 5.7–8.2)
PROTEIN TOTAL: 4.9 g/dL — ABNORMAL LOW (ref 5.7–8.2)
SODIUM: 144 mmol/L (ref 135–145)
SODIUM: 147 mmol/L — ABNORMAL HIGH (ref 135–145)
SODIUM: 147 mmol/L — ABNORMAL HIGH (ref 135–145)

## 2024-03-12 LAB — APTT
APTT: 30.8 s (ref 24.8–38.4)
APTT: 32 s (ref 24.8–38.4)
APTT: 31.5 s (ref 24.8–38.4)
HEPARIN CORRELATION: 0.2
HEPARIN CORRELATION: 0.2
HEPARIN CORRELATION: 0.2

## 2024-03-12 LAB — FIBRINOGEN
FIBRINOGEN LEVEL: 248 mg/dL (ref 175–500)
FIBRINOGEN LEVEL: 241 mg/dL (ref 175–500)
FIBRINOGEN LEVEL: 238 mg/dL (ref 175–500)

## 2024-03-12 LAB — MAGNESIUM
MAGNESIUM: 2.1 mg/dL (ref 1.6–2.6)
MAGNESIUM: 2.2 mg/dL (ref 1.6–2.6)
MAGNESIUM: 2 mg/dL (ref 1.6–2.6)

## 2024-03-12 LAB — PROTIME-INR
INR: 1.51
INR: 1.61
INR: 1.7
PROTIME: 17.2 s — ABNORMAL HIGH (ref 9.9–12.6)
PROTIME: 18.3 s — ABNORMAL HIGH (ref 9.9–12.6)
PROTIME: 19.4 s — ABNORMAL HIGH (ref 9.9–12.6)

## 2024-03-12 LAB — PLATELET COUNT
PLATELET COUNT: 80 10*9/L — ABNORMAL LOW (ref 150–450)
PLATELET COUNT: 95 10*9/L — ABNORMAL LOW (ref 150–450)

## 2024-03-12 MED ADMIN — magnesium oxide (MAG-OX) tablet 400 mg: 400 mg | ORAL | @ 13:00:00

## 2024-03-12 MED ADMIN — thiamine mononitrate (vit B1) tablet 100 mg: 100 mg | GASTROENTERAL | @ 13:00:00

## 2024-03-12 MED ADMIN — NORepinephrine 8 mg in dextrose 5 % 250 mL (32 mcg/mL) infusion PMB: 0-30 ug/min | INTRAVENOUS | @ 03:00:00

## 2024-03-12 MED ADMIN — anastrozole (ARIMIDEX) tablet 1 mg: 1 mg | GASTROENTERAL | @ 13:00:00

## 2024-03-12 MED ADMIN — pantoprazole (Protonix) injection 40 mg: 40 mg | INTRAVENOUS | @ 03:00:00

## 2024-03-12 MED ADMIN — propofol (DIPRIVAN) infusion 10 mg/mL: 0-50 ug/kg/min | INTRAVENOUS | @ 02:00:00

## 2024-03-12 MED ADMIN — lactated ringers bolus 1,000 mL: 1000 mL | INTRAVENOUS | @ 10:00:00 | Stop: 2024-03-12

## 2024-03-12 MED ADMIN — lactulose oral solution: 20 g | GASTROENTERAL | @ 13:00:00

## 2024-03-12 MED ADMIN — lactulose (CEPHULAC) packet 20 g: 20 g | ORAL | @ 05:00:00 | Stop: 2024-03-12

## 2024-03-12 MED ADMIN — multivitamins, therapeutic with minerals tablet 1 tablet: 1 | GASTROENTERAL | @ 18:00:00

## 2024-03-12 MED ADMIN — piperacillin-tazobactam (ZOSYN) IVPB (premix) 4.5 g: 4.5 g | INTRAVENOUS | Stop: 2024-03-14

## 2024-03-12 MED ADMIN — lactulose (CEPHULAC) packet 20 g: 20 g | ORAL | @ 09:00:00 | Stop: 2024-03-12

## 2024-03-12 MED ADMIN — piperacillin-tazobactam (ZOSYN) IVPB (premix) 4.5 g: 4.5 g | INTRAVENOUS | @ 11:00:00 | Stop: 2024-03-14

## 2024-03-12 MED ADMIN — potassium chloride (KLOR-CON) packet 40 mEq: 40 meq | GASTROENTERAL | @ 03:00:00 | Stop: 2024-03-11

## 2024-03-12 MED ADMIN — dextrose 5 % infusion: 50 mL/h | INTRAVENOUS | @ 03:00:00 | Stop: 2024-03-17

## 2024-03-12 MED ADMIN — propofol (DIPRIVAN) infusion 10 mg/mL: 0-50 ug/kg/min | INTRAVENOUS | @ 03:00:00

## 2024-03-12 MED ADMIN — piperacillin-tazobactam (ZOSYN) IVPB (premix) 4.5 g: 4.5 g | INTRAVENOUS | @ 16:00:00 | Stop: 2024-03-14

## 2024-03-12 MED ADMIN — cyanocobalamin (vitamin B-12) tablet 1,000 mcg: 1000 ug | GASTROENTERAL | @ 13:00:00

## 2024-03-12 MED ADMIN — magnesium oxide (MAG-OX) tablet 400 mg: 400 mg | ORAL | @ 03:00:00

## 2024-03-12 MED ADMIN — piperacillin-tazobactam (ZOSYN) IVPB (premix) 4.5 g: 4.5 g | INTRAVENOUS | @ 03:00:00 | Stop: 2024-03-14

## 2024-03-12 MED ADMIN — lactulose oral solution: 20 g | GASTROENTERAL | @ 18:00:00

## 2024-03-12 MED ADMIN — folic acid (FOLVITE) tablet 1 mg: 1 mg | GASTROENTERAL | @ 13:00:00

## 2024-03-12 MED ADMIN — piperacillin-tazobactam (ZOSYN) IVPB (premix) 4.5 g: 4.5 g | INTRAVENOUS | @ 05:00:00 | Stop: 2024-03-14

## 2024-03-12 MED ADMIN — calcium gluc in NaCl, iso-osm 1 gram/50 mL IVPB 1 g: 1 g | INTRAVENOUS | @ 05:00:00 | Stop: 2024-03-12

## 2024-03-12 MED ADMIN — lactulose (CEPHULAC) packet 20 g: 20 g | ORAL | @ 03:00:00

## 2024-03-12 MED ADMIN — pantoprazole (Protonix) injection 40 mg: 40 mg | INTRAVENOUS | @ 13:00:00

## 2024-03-12 NOTE — Unmapped (Signed)
 Hepatology Consult Service   Initial Consultation         Assessment and Recommendations:   [name] is a 78 y.o. 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 Found to have acute hypercarbic respiratory failure, acute hepatic encephalopathy, and hypothermia following procedure, prompting intubation.   The patient is seen in consultation at the request of Elsie Rome Benjamin, MD (Medical ICU (MDI)) for decompensated cirrhosis with hepatic encephalopathy and colonic mass.  Assessment & Plan  Patient presenting with acute hepatic encephalopathy secondary to sepsis due to GNR bacteremia.  Other etiologies of altered mental status have been ruled out: No evidence of stroke on CT head, hypercarbic respiratory failure has resolved, electrolytes have been corrected.  At this point the goal is to work on improvement of mental status to allow for extubation.    CT scan also showed sigmoid colon mass with concern for malignancy; bacteremia may be secondary to bacterial translocation (given normal UA).  Will continue to have ongoing conversations regarding future invasive procedures such as colonoscopy based on overall health and surgical tolerance.    Recommendations:   Agree with continued antibiotics for GNR bacteremia  Agree with lactulose  through NGT  Diagnostic paracentesis if patient with pocket   Continue ICU care per MICU team    Cirrhosis checklist  Etiology: MASH and alcohol, decompensated, complicated by encephalopathy  Hepatologist: Levan Baron NP  HE: lactulose  via NGT q4 hours, goal 4-5x bowel movement/day. CT head normal.   Varices: 02/03/2018 EGD (cirrhosis): Grade 1 varices seen 2 columns. Home: Coreg  3.125 mg BID  Ascites/Volume: None on imaging or bedside US   Infection: UA negative , Respiratory panel normal, blood cultures GNR bacteremia (10/9), Antibiotics: ceftriaxone, zosyn  Imaging: CTAP 10/10 which shows moderate right and trace left pleural effusion, masslike thickening of the lower sigmoid concerning for colonic malignancy, cirrhotic morphology with multiple varices in the left hemiabdomen as well as splenorenal shunt.    MELD 3.0: 16 at 03/12/2024  3:44 AM  MELD-Na: 14 at 03/12/2024  3:44 AM  Calculated from:  Serum Creatinine: 0.96 mg/dL (Using min of 1 mg/dL) at 89/88/7974  6:55 AM  Serum Sodium: 144 mmol/L (Using max of 137 mmol/L) at 03/12/2024  3:44 AM  Total Bilirubin: 2.1 mg/dL at 89/88/7974  6:55 AM  Serum Albumin: 2.4 g/dL at 89/88/7974  6:55 AM  INR(ratio): 1.51 at 03/12/2024  3:44 AM  Age at listing (hypothetical): 71 years  Sex: Female at 03/12/2024  3:44 AM      Issues Impacting Complexity of Management:  -None    Recommendations discussed with the patient's primary team. We will continue to follow along with you.    For questions, contact the on-call fellow for the Hepatology Consult Service.    Subjective:   [name] was admitted from home for planned TEE/DCCV following a recent hospitalization for acute decompensated heart failure with preserved ejection fraction and new-onset atrial fibrillation. During the procedure, 30mg  propofol  was administered for sedation, but DCCV was not performed due to visualization of a left atrial appendage thrombus. Her post-procedure course was complicated by persistent somnolence and acute hypoxia, requiring further inpatient management. Since discharge, she has exhibited increased lethargy, decreased appetite, and early satiety, but  has reportedly been adherent to her prescribed medications.    She was transferred to main hospital 10/10 evening given hypercarbic respiratory failure, hepatic encephalopathy, and hypothermia prompting intubation.  Has required low-dose pressors starting 10/10 up to a max of 6, most recently 2.  Per nursing at bedside, propofol  has been off for a few hours and still RASS -4 at rest.  Gets agitated with very vigorous stimuli.    Blood cultures from 10/9 showed GNR bacteremia, E. coli and Enterobacteriaceae.  On ceftriaxone and Zosyn.  Respiratory pathogen panel negative.  UA negative.  CTAP 10/10 which shows moderate right and trace left pleural effusion, masslike thickening of the lower sigmoid concerning for colonic malignancy, cirrhotic morphology with multiple varices in the left hemiabdomen as well as splenorenal shunt. On 10/10, patient noted have brown stool followed by maroon stool with bright pink blood tinging the pad below.  Hemoglobin stable at 13.2.  Suspect related to rectal trauma from enemas or alternative lower GI bleeding source. Last Eliquis 10/3.    History of Present Illness  Her caregivers noted a decline in her cognitive function at home, which became more pronounced, leading to her current hospitalization. She has been receiving treatment to clear her ammonia levels. Her caregivers have observed that it took a few days for her to become aware after previous episodes, with full cognitive recovery taking months.    Liver history   Previously followed by Orie Sing in liver clinic since 2019.  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  Last clinic visit 03/21/2019.    Prior GI work-up:  02/03/2018 EGD (cirrhosis): Grade 1 varices seen 2 columns.  PHG in the entire stomach, retained fluid in the stomach.  Normal duodenum      Objective:   Temp:  [36.1 ??C (97 ??F)-36.6 ??C (97.9 ??F)] 36.6 ??C (97.9 ??F)  Pulse:  [66-91] 77  SpO2 Pulse:  [62-93] 78  Resp:  [11-24] 16  BP: (78-187)/(31-103) 187/66  FiO2 (%):  [30 %-40 %] 30 %  SpO2:  [94 %-100 %] 98 %    Gen: Acutely ill-appearing female in NAD, unable to answer questions due to acute illness  Eyes: Sclera anicteric  Abdomen: Normoactive bowel sounds, soft, NTND  Extremities: No edema in the BLEs    Pertinent Labs/Studies:  -I have visualized the patient's CTAP dated 10/10 which shows moderate right and trace left pleural effusion, masslike thickening of the lower sigmoid concerning for colonic malignancy, cirrhotic morphology with multiple varices in the left hemiabdomen as well as splenorenal shunt.

## 2024-03-12 NOTE — Unmapped (Signed)
 Shift Summary  Norepinephrine infusion was titrated several times in response to fluctuating blood pressure and MAP values.   Propofol  infusion was stopped mid-morning, with deep sedation transitioning to moderate and then restless states before returning to deep sedation.   Oxygen saturation briefly dropped at 3:00 PM but quickly returned to normal, with stable ventilator settings and regular respiratory interventions.  Patient transitioned to PSV early AM, with  brief bouts of apnea.  Piperacillin-tazobactam administered per order   Overall, frequent monitoring and interventions were required to address hemodynamic and respiratory changes.     Absence of Hospital-Acquired Illness or Injury: Skin and tissue protection measures were consistently implemented, including regular repositioning, use of absorbent pads, and limited adhesive use; no new device-related skin issues documented during the shift. Aseptic technique and infection prevention protocols were maintained throughout care.     Optimal Gas Exchange: Oxygen saturation remained mostly within normal limits except for a brief drop at 3:00 PM, which quickly resolved; ventilator settings and oxygen therapy were stable, and respiratory interventions were performed regularly. Arterial blood gases showed persistently elevated pH, HCO3, and lactate, with adequate oxygenation.     Optimal Cognitive Function: Cognitive and orientation assessments were unable to be completed, and CAM-ICU remained positive throughout the shift, with deep sedation documented for most of the day.     Maintenance of Heart Failure Symptom Control: Atrial fibrillation persisted on cardiac rhythm monitoring, but jugular venous distention was not present and cardiac monitoring was maintained.     Blood Pressure in Desired Range: Blood pressure and MAP fluctuated throughout the shift, with periods of both low and elevated readings; norepinephrine infusion was adjusted multiple times in response.

## 2024-03-12 NOTE — Unmapped (Signed)
 MICU Progress Note     Date of Service: 03/12/2024    Problem List:   Principal Problem:    Bacteremia, escherichia coli  Active Problems:    Alcoholic liver disease (HHS-HCC)    HTN (hypertension)    Depression with anxiety    Class 2 severe obesity with serious comorbidity in adult    Atrial fibrillation    (CMS-HCC)    Pleural effusion    Hypernatremia    Thrombocytopenia    Cirrhosis    (CMS-HCC)    A-fib (CMS-HCC)    Hepatic encephalopathy    (CMS-HCC)      Transfer Summary: [name] is a 78 y.o. 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 Found to have acute hypercarbic respiratory failure, acute hepatic encephalopathy, and hypothermia following procedure, prompting intubation. Transferred to MICU for further management.     24hr events  - remains on low dose propofol   - remains on low dose norepi  - GNRs in blood, on zosyn    Neurological   Acute Toxic Metabolic Encephalopathy  Concern for Hepatic Encephalopathy  S/p Procedural Sedation  Patient presented for scheduled TEE/DCCV with Cardiology on 10/10, received 50 mg propofol  and found to be difficult to wake following sedation with hypercarbic respiratory failure. Per chart review, initially poorly responsive to stimulation and started on NIPPV with BiPAP, hypercarbia improved but remained altered eventually requiring intubation as below. Ddx for ongoing encephalopathy includes toxic metabolites 2/2 hypercarbia and electrolyte abnormalities, HE (not on lactulose ), infection/sepsis, ongoing sedation 2/2 pre-procedural propofol .  - Sedated on propofol  gtt  - PRN fentanyl  for analgesia  - Management of HE as below    Analgesia: Pain adequately controlled  RASS at goal? 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 Found to have ABG w/ pH 7.28 / pCO2 86 / pO2 69. Transferred to CCU w/ pulm consult and placed on NIPPV. CXR with slight pulmonary edema, diuresed 1x w/ IV Lasix 40 mg. Hypercarbia corrected on BiPAP then AVAPS from 86 > 58 but ultimately required intubation due to acute hypoxic respiratory failure from hypoventilation likely due to encephalopathy.   - fairly minimal vent support   - gets agitated off sedation but mental status still poor.     Vent Mode: PRVC  S RR:  [12-20] 14  FiO2 (%):  [30 %-40 %] 30 %  S VT:  [330 mL-450 mL] 330 mL  PR SUP:  [10 cm H20] 10 cm H20  O2 Device: Ventilator    Cardiovascular   Hypotension likely 2/2 sedation +/- infection  - s/p 2L fluid today, on low dose norepi for SBP>65    Chronic HFpEF - HTN  Discharged 03/04/24 after several weeks worsening BLE edema, orthopnea and dyspnea, w/ Pro-BNP 850 and HFpEF on TTE. Dry weight 195 lbs. Established care w/ Cardiology since discharge, currently on diuretic PRN, MRA, coreg  at home. On admission, pro-BNP 520, slightly elevated from 490 at previous discharge but improved from 846 on 02/29/24. CXR with interstitial edema and bilateral pulmonary effusions. Diuresed x 1  with IV lasix 40 mg with ~1.5L UOP on 10/9. Given 2L LR at OSH for sepsis on 10/10.  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 Started on Eliquis (cost-prohibitive due to not meeting deductible). Attempted TEE DCCV however aborted due to LAA thrombus.  - HOLD Eliquis 5 mg BID in setting of c/f GIB  - HOLD Coreg  BID in setting of acute infection    Renal   AKI  Baseline Cr ~0.4. Creatinine elevated to 0.66 on admission.  - Strict I/Os  - Trend BMPs    Infectious Disease/Autoimmune   Sepsis  Afebrile upon arrival to scheduled procedure via axillary temp, but found to be hypothermic to 34.1C via foley catheter upon admission to OSH. Normotensive, non-tachycardic, without leukocytosis at the time. Hypothermia improved with Bair Hugger. Eventually sedated and intubated as above for acute hypoxic respiratory failure and encephalopathy. No fluid pocket for diagnostic paracentesis on POCUS. CXR with interstitial edema and bilateral pulmonary effusions but no focal consolidation. UA noninfectious. Blood cultures x 2 found to be positive for E. Coli. Lactate increased from 2 to 6.2 on recheck. MAPs have remained > 65, not on pressors. Started on Zosyn at OSH. Ddx for etiology GI translocation vs SBP.  - Continue Zosyn  - has GNR in blood           FEN/GI   MetALD Cirrhosis - Hx Hepatic Encephalopathy  History of decompensated metabolic-associated cirrhosis with HE, G1 EV and PHG on EGD 01/2018. Presented altered and encephalopathic following sedation as above. Ammonia level elevated 192 > 251 from previous level of 30. Liver doppler US  negative for acute thrombosis. Not on lactulose  prior to admission. Had been receiving lactulose  enemas at OSH. Concern for development of GI bleed on 10/10 AM as below, GI consulted at OSH with low suspicion for variceal bleed, recommend placing NG tube for lactulose  administration to prevent further bleed.  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 No hematemesis. HDS, not on pressors, Hgb stable. S/p PPI, ceftriaxone, and octreotide for GIB PPX. Per GI consult at OSH, low suspicion for variceal bleeding given HD stability and stable hemoglobin. Suspect related to rectal trauma from enemas or alternative lower GI bleeding source.  - IV PPI  - On Zosyn so can hold on further ceftriaxone PPX  - Monitor for further hematochezia  - Trend CBC  - 1u cryo 10/10    Provider Malnutrition Assessment:  Body mass index is 32.15 kg/m??.BMI Interpretation: >/= 30 and < 40, consistent with obesity, clinically significant requiring additional resources and complicating multiple aspects of patient care.  GLIM criteria:   Pt does not meet criteria  -I have screened this patient for malnutrition and they did NOT meet criteria for malnutrition based on GLIM criteria.  -Nutrition consulted no  RD assessment:Not done yet.           Heme/Coag   - hold home eliquis and dvt prophy    Endocrine   - NAI    Prophylaxis/LDA/Restraints/Consults   ICU Checklist completed: yes (see ICU rounding navigator in Epic)      Patient Lines/Drains/Airways Status       Active Active Lines, Drains, & Airways       Name Placement date Placement time Site Days    ETT  7.5 03/11/24  0915  -- 1    CVC Triple Lumen 03/11/24 Non-tunneled Right Internal jugular 03/11/24  2200  Internal jugular  less than 1    NG/OG Tube 16 Fr. Center mouth 03/11/24  1400  Center mouth  less than 1    Urethral Catheter 03/10/24  1800  --  1    Peripheral IV 03/11/24 Left Antecubital 03/11/24  0907  Antecubital  1    Arterial Line 03/11/24 Left Radial 03/11/24  1845  Radial  less than 1                  Patient Lines/Drains/Airways Status       Active Wounds       Name Placement date Placement time Site Days    Wound 03/01/24 Other (Comment) Toe (Comment which one) Left;Other (Comment) Blister present on arrival, tx already in outpatient setting, in process of healing, surrounding erythema 03/01/24  0129  Toe (Comment which one)  11                    Goals of Care     Code Status:   Orders Placed This Encounter   Procedures    Full Code     Standing Status:   Standing     Number of Occurrences:   1        Designated Healthcare Decision Maker:  Ms. Osias designated healthcare decision maker(s) is/are   HCDM (patient stated preference): Destin,[name] - Spouse - [phone]    HCDM, First AlternateSharese, Manrique - Daughter - [phone]. See HCDM section of Epic sidebar/storyboard or ACP tab in patient chart for details regarding active HCDMs and patient capacity for decision-making.      Subjective     Intubated, sedated    Objective     Vitals - past 24 hours  Temp:  [36.1 ??C (97 ??F)-36.6 ??C (97.9 ??F)] 36.6 ??C (97.9 ??F)  Pulse:  [66-91] 82  SpO2 Pulse:  [62-93] 81  Resp:  [11-24] 20  BP: (78-165)/(31-103) 143/57  FiO2 (%):  [30 %-40 %] 30 %  SpO2:  [93 %-100 %] 98 % Intake/Output  I/O last 3 completed shifts:  In: 5752.1 [I.V.:3172.1; NG/GT:280; IV Piggyback:2300]  Out: 1685 [Urine:1635; Emesis/NG output:50]     Physical Exam:    General: chronically ill appearing, intubated  HEENT: dry mucous membranes  CV: normal rate, irreg rhythm, no m/r/g appreciated  Pulm: diminished bilaterally but clear  GI: soft, distended, non-tender  MSK: BLE edema present  Skin: scattered bruising throughout, fragile  Neuro: no focal deficits, sedated       Continuous Infusions:   Infusions Meds[1]    Scheduled Medications:   Scheduled Medications[2]    PRN medications:  PRN Medications[3]    Data/Imaging Review:  Reviewed in Epic and personally interpreted on 03/12/2024. See EMR for detailed results.       Critical Care Attestation     This patient is critically ill or injured with the impairment of vital organ systems such that there is a high probability of imminent or life threatening deterioration in the patient's condition. This patient must remain in the ICU for ongoing evaluation of the comprehensive management plan outlined in this note. I directly provided critical care services as documented in this note and the critical care time spent (50 min) is exclusive of separately billable procedures.  In addition to time spent for critical care management, I also provided advance care planning services for 0 minutes (see GOC above or ACP note for details)???. Total billable critical care time 50 minutes.    Dewayne GORMAN Roa, ACNP               [1]    NORepinephrine bitartrate-NS 4 mcg/min (03/12/24 1000)    propofol  10 mg/mL infusion Stopped (03/12/24 0924)   [2]    anastrozole   1 mg Enteral tube: gastric Daily    [Provider Hold] carvedilol   3.125 mg Oral BID    cyanocobalamin (vitamin B-12)  1,000 mcg Enteral tube: gastric Daily    folic acid   1 mg Enteral tube: gastric Daily    lactulose   20 g Enteral tube: gastric Q4H    magnesium  oxide  400 mg Oral BID    pantoprazole (Protonix) intravenous solution  40 mg Intravenous BID    piperacillin-tazobactam (ZOSYN) IV (intermittent)  4.5 g Intravenous Q6H    thiamine  mononitrate (vit B1)  100 mg Enteral tube: gastric Daily   [3] acetaminophen , docusate sodium, fentaNYL  (PF) **OR** fentaNYL  (PF), haloperidol LACTATE, ondansetron , polyethylene glycol

## 2024-03-12 NOTE — Unmapped (Deleted)
 MICU H&P     Date of Service: 03/12/2024    Problem List:   Principal Problem:    Bacteremia, escherichia coli  Active Problems:    Alcoholic liver disease (HHS-HCC)    HTN (hypertension)    Depression with anxiety    Class 2 severe obesity with serious comorbidity in adult    Atrial fibrillation    (CMS-HCC)    Pleural effusion    Hypernatremia    Thrombocytopenia    Cirrhosis    (CMS-HCC)    A-fib (CMS-HCC)    Hepatic encephalopathy    (CMS-HCC)      Transfer Summary: [name] is a 78 y.o. 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 Found to have acute hypercarbic respiratory failure, acute hepatic encephalopathy, and hypothermia following procedure, prompting intubation. Transferred to MICU for further management.     24hr events  - remains on low dose propofol   - remains on low dose norepi  - GNRs in blood, on zosyn    Neurological   Acute Toxic Metabolic Encephalopathy  Concern for Hepatic Encephalopathy  S/p Procedural Sedation  Patient presented for scheduled TEE/DCCV with Cardiology on 10/10, received 50 mg propofol  and found to be difficult to wake following sedation with hypercarbic respiratory failure. Per chart review, initially poorly responsive to stimulation and started on NIPPV with BiPAP, hypercarbia improved but remained altered eventually requiring intubation as below. Ddx for ongoing encephalopathy includes toxic metabolites 2/2 hypercarbia and electrolyte abnormalities, HE (not on lactulose ), infection/sepsis, ongoing sedation 2/2 pre-procedural propofol .  - Sedated on propofol  gtt  - PRN fentanyl  for analgesia  - Management of HE as below    Analgesia: Pain adequately controlled  RASS at goal? 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 Found to have ABG w/ pH 7.28 / pCO2 86 / pO2 69. Transferred to CCU w/ pulm consult and placed on NIPPV. CXR with slight pulmonary edema, diuresed 1x w/ IV Lasix 40 mg. Hypercarbia corrected on BiPAP then AVAPS from 86 > 58 but ultimately required intubation due to acute hypoxic respiratory failure from hypoventilation likely due to encephalopathy.   - fairly minimal vent support   - gets agitated off sedation but mental status still poor.     Vent Mode: PRVC  S RR:  [12-20] 14  FiO2 (%):  [30 %-40 %] 30 %  S VT:  [330 mL-450 mL] 330 mL  PR SUP:  [10 cm H20] 10 cm H20  O2 Device: Ventilator    Cardiovascular   Hypotension likely 2/2 sedation +/- infection  - s/p 2L fluid today, on low dose norepi for SBP>65    Chronic HFpEF - HTN  Discharged 03/04/24 after several weeks worsening BLE edema, orthopnea and dyspnea, w/ Pro-BNP 850 and HFpEF on TTE. Dry weight 195 lbs. Established care w/ Cardiology since discharge, currently on diuretic PRN, MRA, coreg  at home. On admission, pro-BNP 520, slightly elevated from 490 at previous discharge but improved from 846 on 02/29/24. CXR with interstitial edema and bilateral pulmonary effusions. Diuresed x 1  with IV lasix 40 mg with ~1.5L UOP on 10/9. Given 2L LR at OSH for sepsis on 10/10.  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 Started on Eliquis (cost-prohibitive due to not meeting deductible). Attempted TEE DCCV however aborted due to LAA thrombus.  - HOLD Eliquis 5 mg BID in setting of c/f GIB  - HOLD Coreg  BID in setting of acute infection    Renal   AKI  Baseline Cr ~0.4. Creatinine elevated to 0.66 on admission.  - Strict I/Os  - Trend BMPs    Infectious Disease/Autoimmune   Sepsis  Afebrile upon arrival to scheduled procedure via axillary temp, but found to be hypothermic to 34.1C via foley catheter upon admission to OSH. Normotensive, non-tachycardic, without leukocytosis at the time. Hypothermia improved with Bair Hugger. Eventually sedated and intubated as above for acute hypoxic respiratory failure and encephalopathy. No fluid pocket for diagnostic paracentesis on POCUS. CXR with interstitial edema and bilateral pulmonary effusions but no focal consolidation. UA noninfectious. Blood cultures x 2 found to be positive for E. Coli. Lactate increased from 2 to 6.2 on recheck. MAPs have remained > 65, not on pressors. Started on Zosyn at OSH. Ddx for etiology GI translocation vs SBP.  - Continue Zosyn  - has GNR in blood           FEN/GI   MetALD Cirrhosis - Hx Hepatic Encephalopathy  History of decompensated metabolic-associated cirrhosis with HE, G1 EV and PHG on EGD 01/2018. Presented altered and encephalopathic following sedation as above. Ammonia level elevated 192 > 251 from previous level of 30. Liver doppler US  negative for acute thrombosis. Not on lactulose  prior to admission. Had been receiving lactulose  enemas at OSH. Concern for development of GI bleed on 10/10 AM as below, GI consulted at OSH with low suspicion for variceal bleed, recommend placing NG tube for lactulose  administration to prevent further bleed.  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 No hematemesis. HDS, not on pressors, Hgb stable. S/p PPI, ceftriaxone, and octreotide for GIB PPX. Per GI consult at OSH, low suspicion for variceal bleeding given HD stability and stable hemoglobin. Suspect related to rectal trauma from enemas or alternative lower GI bleeding source.  - IV PPI  - On Zosyn so can hold on further ceftriaxone PPX  - Monitor for further hematochezia  - Trend CBC  - 1u cryo 10/10    Provider Malnutrition Assessment:  Body mass index is 32.15 kg/m??.BMI Interpretation: >/= 30 and < 40, consistent with obesity, clinically significant requiring additional resources and complicating multiple aspects of patient care.  GLIM criteria:   Pt does not meet criteria  -I have screened this patient for malnutrition and they did NOT meet criteria for malnutrition based on GLIM criteria.  -Nutrition consulted no  RD assessment:Not done yet.           Heme/Coag   - hold home eliquis and dvt prophy    Endocrine   - NAI    Prophylaxis/LDA/Restraints/Consults   ICU Checklist completed: yes (see ICU rounding navigator in Epic)      Patient Lines/Drains/Airways Status       Active Active Lines, Drains, & Airways       Name Placement date Placement time Site Days    ETT  7.5 03/11/24  0915  -- 1    CVC Triple Lumen 03/11/24 Non-tunneled Right Internal jugular 03/11/24  2200  Internal jugular  less than 1    NG/OG Tube 16 Fr. Center mouth 03/11/24  1400  Center mouth  less than 1    Urethral Catheter 03/10/24  1800  --  1    Peripheral IV 03/11/24 Left Antecubital 03/11/24  0907  Antecubital  1    Arterial Line 03/11/24 Left Radial 03/11/24  1845  Radial  less than 1                  Patient Lines/Drains/Airways Status       Active Wounds       Name Placement date Placement time Site Days    Wound 03/01/24 Other (Comment) Toe (Comment which one) Left;Other (Comment) Blister present on arrival, tx already in outpatient setting, in process of healing, surrounding erythema 03/01/24  0129  Toe (Comment which one)  11                    Goals of Care     Code Status:   Orders Placed This Encounter   Procedures    Full Code     Standing Status:   Standing     Number of Occurrences:   1        Designated Healthcare Decision Maker:  Ms. Sahagian designated healthcare decision maker(s) is/are   HCDM (patient stated preference): Euceda,[name] - Spouse - [phone]    HCDM, First AlternateArayah, Krouse - Daughter - [phone]. See HCDM section of Epic sidebar/storyboard or ACP tab in patient chart for details regarding active HCDMs and patient capacity for decision-making.      Subjective     Intubated, sedated    Objective     Vitals - past 24 hours  Temp:  [36.1 ??C (97 ??F)-36.6 ??C (97.9 ??F)] 36.6 ??C (97.9 ??F)  Pulse:  [67-94] 73  SpO2 Pulse:  [62-94] 75  Resp:  [11-24] 14  BP: (78-165)/(31-103) 143/57  FiO2 (%):  [30 %-40 %] 30 %  SpO2:  [93 %-100 %] 98 % Intake/Output  I/O last 3 completed shifts:  In: 5752.1 [I.V.:3172.1; NG/GT:280; IV Piggyback:2300]  Out: 1685 [Urine:1635; Emesis/NG output:50]     Physical Exam:    General: chronically ill appearing, intubated  HEENT: dry mucous membranes  CV: normal rate, irreg rhythm, no m/r/g appreciated  Pulm: diminished bilaterally but clear  GI: soft, distended, non-tender  MSK: BLE edema present  Skin: scattered bruising throughout, fragile  Neuro: no focal deficits, sedated       Continuous Infusions:   Infusions Meds[1]    Scheduled Medications:   Scheduled Medications[2]    PRN medications:  PRN Medications[3]    Data/Imaging Review:  Reviewed in Epic and personally interpreted on 03/12/2024. See EMR for detailed results.       Critical Care Attestation     This patient is critically ill or injured with the impairment of vital organ systems such that there is a high probability of imminent or life threatening deterioration in the patient's condition. This patient must remain in the ICU for ongoing evaluation of the comprehensive management plan outlined in this note. I directly provided critical care services as documented in this note and the critical care time spent (50 min) is exclusive of separately billable procedures.  In addition to time spent for critical care management, I also provided advance care planning services for 0 minutes (see GOC above or ACP note for details)???. Total billable critical care time 50 minutes.    Dewayne GORMAN Roa, ACNP             [1]    NORepinephrine bitartrate-NS 4 mcg/min (03/12/24 0913)    propofol  10 mg/mL infusion Stopped (03/12/24 0924)   [2]    anastrozole   1 mg Enteral tube: gastric Daily    [Provider Hold] carvedilol   3.125 mg Oral BID    cyanocobalamin (vitamin B-12)  1,000 mcg Enteral tube: gastric Daily    folic acid   1 mg Enteral tube: gastric Daily    lactulose   20 g Enteral tube: gastric Q4H    magnesium  oxide  400 mg Oral BID    pantoprazole (Protonix) intravenous solution  40 mg Intravenous BID    piperacillin-tazobactam (ZOSYN) IV (intermittent)  4.5 g Intravenous Q6H    thiamine  mononitrate (vit B1)  100 mg Enteral tube: gastric Daily   [3] acetaminophen , docusate sodium, fentaNYL  (PF) **OR** fentaNYL  (PF), haloperidol LACTATE, ondansetron , polyethylene glycol

## 2024-03-12 NOTE — Unmapped (Signed)
 Shift Summary  FiO2 and ventilator settings were decreased early in the shift, and oxygenation remained adequate throughout.  Propofol  and norepinephrine bitartrate-D5W were administered to support sedation and hemodynamics, with adjustments made as needed.  Cryoprecipitate was transfused PRN to address coagulation needs, and piperacillin-tazobactam was administered and stopped twice during the shift.  Calcium gluc in NaCl, iso-osm was administered and later stopped, and comprehensive metabolic and coagulation panels were monitored for changes.  Overall, oxygenation and comfort were maintained, and skin integrity was supported with frequent interventions.    Skin Health and Integrity: Braden Scale score remained low, and frequent repositioning, weight shift assistance, and use of pressure-redistributing devices were maintained throughout the shift; zinc oxide barrier cream and incontinence pads were consistently applied, with tubing kept free from skin contact to support skin protection.    Optimal Comfort and Wellbeing: CPOT scores remained at 0 with relaxed facial expression and muscle tension, and no abnormal body movements were observed, suggesting comfort was maintained during the shift.    Effective Breathing Pattern: Ventilator settings were adjusted with a decrease in tidal volume and respiratory rate early in the shift, and respiratory pattern alternated between ventilator controlled and regular; airway and oral suctioning were performed as needed.    Optimal Gas Exchange: SpO2 values remained above 94% and arterial blood gases showed consistently high oxygen saturation, with FiO2 reduced from 40% to 30% early in the shift and maintained thereafter.

## 2024-03-12 NOTE — Unmapped (Signed)
 PULMONARY & CRITICAL CARE MEDICINE    MICU APP Critical Care Note:       [name] is a 78 y.o. female who is critically ill with GNR bacteremia and decompensated cirrhosis c/b encephalopathy    ASSESSMENT & PLAN  - continues on lactulose , has started to stool  - no ascites for diagnostic para on bedside US   - remains on minimal vent support  - low dose norepi persists  - off propofol  and other sedation since this AM.   - gave 50g albumin  - off norepi following albumin  - urine output appears slightly bloody this AM, will follow   - continues w/ non-purposeful movement and mental status remains poor    Critical Care Attestation     This patient is critically ill or injured with the impairment of vital organ systems such that there is a high probability of imminent or life threatening deterioration in the patient's condition. This patient must remain in the ICU for ongoing evaluation of the comprehensive management plan outlined in this note. I directly provided critical care services as documented in this note and the critical care time spent (45 min) is exclusive of separately billable procedures.  In addition to time spent for critical care management, I also provided advance care planning services for 0 minutes (see GOC above or ACP note for details)???. Total billable critical care time 45 minutes.    Shulem Mader S Gayl Ivanoff, ACNP

## 2024-03-12 NOTE — Unmapped (Signed)
 Adult Nutrition Assessment Note    Visit Type: MD Consult  Reason for Visit: Enteral Nutrition    NUTRITION INTERVENTIONS and RECOMMENDATION     Tube feeds:   Recommend Nutren 2.0 at goal rate 25 mL/hr. This provides 1050 kcals, 45 g protein, 114 g carbohydrate, 49 g fat, 0 g fiber, 363 mL free water , and meets 70% USRDI.  Start at 10 mL/hr and advance by 10 mL Q4h toward goal rate   FWF: 30 mL Q4h   Prosource 2 pkts BID --> 240 kcal, 60 g protein   Total nutrition: 1240 kcal, 104 g protein   Micronutrients:   Add multivitamin give the above meets <90% DRIs   Continue folvite /thiamine  as ordered     NUTRITION ASSESSMENT     Patient appropriate for enteral nutrition support given mechanical ventilation.   Patient appropriate for a fluid restricted enteral formula due to need for volume restriction.   Micronutrients per above    NUTRITIONALLY RELEVANT DATA     HPI & PMH:   [name] is a 78 y.o. female who is presenting to [facility] with Bacteremia, escherichia coli, in the setting of the following pertinent/contributing co-morbidities:  HFpEF, HTN, Cirrhosis, Breast Cancer s/p Partial Mastectomy/Radiation.     Nutrition History:   Intubated, no family at bedside. Limited hx available.   OGT, transferred to MICU from [facility] 10/10.     Medications:  Nutritionally pertinent medications reviewed and evaluated for potential food and/or medication interactions.   B12, folvite , 3L in LR boluses, lactulose  20g Q6h, mg 400 mg BID, protonix, zosyn, klor-con , thiamine  100 mg   OFF norepi as of noon   OFF propofol  as of 9 am     Labs:   Nutritionally pertinent labs reviewed.   Lytes WNL, blood sugars WNL aside from one outlier     Nutritional Needs:   Daily Estimated Nutrient Needs:  Energy: 1375-1650 kcals 25-30 kcal/kg using ideal body weight, 55 kg (03/12/24 1254)]  Protein: >110 gm [> 2 gm/kg (per ASPEN/SCCM guidelines for the critically ill obese, BMI 30-39.9) using ideal body weight, 55 kg (03/12/24 1254)]  Carbohydrate:   [45-60% of kcal]  Fluid:   mL [per MD team]    Compared to 11-14 kcal/kg actual 85 kg = [phone]    Compared to [facility]SU age >77 BMI >30 (10/11)   1393 (5.22; 37.7)       Anthropometric Data:  Height: 162.6 cm (5' 4.02)   Admission weight: 87 kg (191 lb 12.8 oz)  Last recorded weight: 85 kg (187 lb 6.4 oz) (03/11/24)  IBW: 54.53 kg  BMI: Body mass index is 32.15 kg/m??.   Usual Body Weight: Unable to obtain at this time   Weight Assessment: per below - trending down though though cirrhotic & currently unknown dry wt     Wt Readings from Last 10 Encounters:   03/11/24 85 kg (187 lb 6.4 oz)   03/07/24 89.3 kg (196 lb 12.8 oz)   03/03/24 89.1 kg (196 lb 6.4 oz)   04/23/23 96.6 kg (213 lb)   03/05/23 100.2 kg (220 lb 14.4 oz)   11/27/22 (!) 102.5 kg (225 lb 14.4 oz)   10/20/22 (!) 108 kg (238 lb)   07/17/22 (!) 108 kg (238 lb)   06/09/22 (!) 103.2 kg (227 lb 8 oz)   05/08/22 (!) 105.8 kg (233 lb 3.2 oz)     Malnutrition Assessment:  Malnutrition assessment not yet completed at this time due to  lack of nutrition history and inability to complete nutrition focused physical exam (NFPE).     Nutrition Focused Physical Exam:  Unable to complete at this time due to patient inability to participate in exam     Care plan:  Ongoing, unable to diagnose malnutrition at this time    Current Nutrition:  NPO with OG tube in place   Nutrition Orders            NPO Sips with meds; Procedure/Test: NPO starting at 10/09 2157          Nutritionally Pertinent Allergies, Intolerances, Sensitivities, and/or Cultural/Religious Restrictions:  none identified at this time     GOALS and EVALUATION     Patient to meet 60% or greater of nutritional needs via enteral nutrition within the first week of ICU admission. - New    Motivation, Barriers, and Compliance:  Evaluation of motivation, barriers, and compliance pending at this time due to clinical status.     Discharge Planning:   Monitor for potential discharge needs with multi-disciplinary team.          Follow-Up Parameters:   1-2 times per week (and more frequent as indicated)    Hulda La MPH, RD, CNSC, LDN   Pager: [phone]  Phone: [phone]  [email or web address]

## 2024-03-13 LAB — BLOOD GAS CRITICAL CARE PANEL, ARTERIAL
BASE EXCESS ARTERIAL: 4.6 — ABNORMAL HIGH (ref -2.0–2.0)
BASE EXCESS ARTERIAL: 5 — ABNORMAL HIGH (ref -2.0–2.0)
BASE EXCESS ARTERIAL: 6.5 — ABNORMAL HIGH (ref -2.0–2.0)
BASE EXCESS ARTERIAL: 8.2 — ABNORMAL HIGH (ref -2.0–2.0)
BASE EXCESS ARTERIAL: 8.3 — ABNORMAL HIGH (ref -2.0–2.0)
BASE EXCESS ARTERIAL: 9.1 — ABNORMAL HIGH (ref -2.0–2.0)
BASE EXCESS ARTERIAL: 9.1 — ABNORMAL HIGH (ref -2.0–2.0)
CALCIUM IONIZED ARTERIAL (MG/DL): 4.36 mg/dL — ABNORMAL LOW (ref 4.40–5.40)
CALCIUM IONIZED ARTERIAL (MG/DL): 4.45 mg/dL (ref 4.40–5.40)
CALCIUM IONIZED ARTERIAL (MG/DL): 4.48 mg/dL (ref 4.40–5.40)
CALCIUM IONIZED ARTERIAL (MG/DL): 4.6 mg/dL (ref 4.40–5.40)
CALCIUM IONIZED ARTERIAL (MG/DL): 4.68 mg/dL (ref 4.40–5.40)
CALCIUM IONIZED ARTERIAL (MG/DL): 4.88 mg/dL (ref 4.40–5.40)
CALCIUM IONIZED ARTERIAL (MG/DL): 4.99 mg/dL (ref 4.40–5.40)
CARBOXYHEMOGLOBIN: 1.5 % — ABNORMAL HIGH (ref <1.2)
CARBOXYHEMOGLOBIN: 1.9 % — ABNORMAL HIGH (ref <1.2)
CARBOXYHEMOGLOBIN: 2.1 % — ABNORMAL HIGH (ref <1.2)
CARBOXYHEMOGLOBIN: 2.1 % — ABNORMAL HIGH (ref <1.2)
CARBOXYHEMOGLOBIN: 2.2 % — ABNORMAL HIGH (ref <1.2)
CARBOXYHEMOGLOBIN: 2.2 % — ABNORMAL HIGH (ref <1.2)
CARBOXYHEMOGLOBIN: 2.3 % — ABNORMAL HIGH (ref <1.2)
CHLORIDE, WHOLE BLOOD: 107 mmol/L (ref 98–107)
CHLORIDE, WHOLE BLOOD: 107 mmol/L (ref 98–107)
CHLORIDE, WHOLE BLOOD: 110 mmol/L — ABNORMAL HIGH (ref 98–107)
CHLORIDE, WHOLE BLOOD: 111 mmol/L — ABNORMAL HIGH (ref 98–107)
CHLORIDE, WHOLE BLOOD: 111 mmol/L — ABNORMAL HIGH (ref 98–107)
CHLORIDE, WHOLE BLOOD: 113 mmol/L — ABNORMAL HIGH (ref 98–107)
CHLORIDE, WHOLE BLOOD: 115 mmol/L — ABNORMAL HIGH (ref 98–107)
GLUCOSE WHOLE BLOOD: 119 mg/dL (ref 70–179)
GLUCOSE WHOLE BLOOD: 127 mg/dL (ref 70–179)
GLUCOSE WHOLE BLOOD: 128 mg/dL (ref 70–179)
GLUCOSE WHOLE BLOOD: 138 mg/dL (ref 70–179)
GLUCOSE WHOLE BLOOD: 138 mg/dL (ref 70–179)
GLUCOSE WHOLE BLOOD: 139 mg/dL (ref 70–179)
GLUCOSE WHOLE BLOOD: 141 mg/dL (ref 70–179)
HCO3 ARTERIAL: 29 mmol/L — ABNORMAL HIGH (ref 22–27)
HCO3 ARTERIAL: 29 mmol/L — ABNORMAL HIGH (ref 22–27)
HCO3 ARTERIAL: 31 mmol/L — ABNORMAL HIGH (ref 22–27)
HCO3 ARTERIAL: 32 mmol/L — ABNORMAL HIGH (ref 22–27)
HCO3 ARTERIAL: 32 mmol/L — ABNORMAL HIGH (ref 22–27)
HCO3 ARTERIAL: 34 mmol/L — ABNORMAL HIGH (ref 22–27)
HCO3 ARTERIAL: 34 mmol/L — ABNORMAL HIGH (ref 22–27)
HEMOGLOBIN BLOOD GAS: 10.5 g/dL — ABNORMAL LOW (ref 12.00–16.00)
HEMOGLOBIN BLOOD GAS: 10.7 g/dL — ABNORMAL LOW (ref 12.00–16.00)
HEMOGLOBIN BLOOD GAS: 10.8 g/dL — ABNORMAL LOW (ref 12.00–16.00)
HEMOGLOBIN BLOOD GAS: 11.1 g/dL — ABNORMAL LOW (ref 12.00–16.00)
HEMOGLOBIN BLOOD GAS: 11.1 g/dL — ABNORMAL LOW (ref 12.00–16.00)
HEMOGLOBIN BLOOD GAS: 11.1 g/dL — ABNORMAL LOW (ref 12.00–16.00)
HEMOGLOBIN BLOOD GAS: 11.4 g/dL — ABNORMAL LOW (ref 12.00–16.00)
LACTATE BLOOD ARTERIAL: 1.1 mmol/L (ref <1.3)
LACTATE BLOOD ARTERIAL: 1.1 mmol/L (ref <1.3)
LACTATE BLOOD ARTERIAL: 1.1 mmol/L (ref <1.3)
LACTATE BLOOD ARTERIAL: 1.2 mmol/L (ref <1.3)
LACTATE BLOOD ARTERIAL: 1.4 mmol/L — ABNORMAL HIGH (ref <1.3)
LACTATE BLOOD ARTERIAL: 1.4 mmol/L — ABNORMAL HIGH (ref <1.3)
LACTATE BLOOD ARTERIAL: 1.4 mmol/L — ABNORMAL HIGH (ref <1.3)
METHEMOGLOBIN: <1 % (ref <1.5)
METHEMOGLOBIN: <1 % (ref <1.5)
METHEMOGLOBIN: <1 % (ref <1.5)
METHEMOGLOBIN: <1 % (ref <1.5)
METHEMOGLOBIN: <1 % (ref <1.5)
METHEMOGLOBIN: <1 % (ref <1.5)
METHEMOGLOBIN: <1 % (ref <1.5)
O2 SATURATION ARTERIAL: 97.8 % (ref 94.0–100.0)
O2 SATURATION ARTERIAL: 98 % (ref 94.0–100.0)
O2 SATURATION ARTERIAL: 98.7 % (ref 94.0–100.0)
O2 SATURATION ARTERIAL: 98.7 % (ref 94.0–100.0)
O2 SATURATION ARTERIAL: 98.8 % (ref 94.0–100.0)
O2 SATURATION ARTERIAL: 99.1 % (ref 94.0–100.0)
O2 SATURATION ARTERIAL: >100 % — ABNORMAL HIGH (ref 94.0–100.0)
OXYHEMOGLOBIN: 95.4 % (ref 94.0–100.0)
OXYHEMOGLOBIN: 95.4 % (ref 94.0–100.0)
OXYHEMOGLOBIN: 95.8 % (ref 94.0–100.0)
OXYHEMOGLOBIN: 95.9 % (ref 94.0–100.0)
OXYHEMOGLOBIN: 96.4 % (ref 94.0–100.0)
OXYHEMOGLOBIN: 97 % (ref 94.0–100.0)
OXYHEMOGLOBIN: 97.6 % (ref 94.0–100.0)
PCO2 ARTERIAL: 41.4 mmHg (ref 35.0–45.0)
PCO2 ARTERIAL: 42.7 mmHg (ref 35.0–45.0)
PCO2 ARTERIAL: 44.7 mmHg (ref 35.0–45.0)
PCO2 ARTERIAL: 45.9 mmHg — ABNORMAL HIGH (ref 35.0–45.0)
PCO2 ARTERIAL: 47.5 mmHg — ABNORMAL HIGH (ref 35.0–45.0)
PCO2 ARTERIAL: 49.3 mmHg — ABNORMAL HIGH (ref 35.0–45.0)
PCO2 ARTERIAL: 52.9 mmHg — ABNORMAL HIGH (ref 35.0–45.0)
PH ARTERIAL: 7.42 (ref 7.35–7.45)
PH ARTERIAL: 7.43 (ref 7.35–7.45)
PH ARTERIAL: 7.43 (ref 7.35–7.45)
PH ARTERIAL: 7.45 (ref 7.35–7.45)
PH ARTERIAL: 7.45 (ref 7.35–7.45)
PH ARTERIAL: 7.46 — ABNORMAL HIGH (ref 7.35–7.45)
PH ARTERIAL: 7.5 — ABNORMAL HIGH (ref 7.35–7.45)
PO2 ARTERIAL: 101 mmHg (ref 80.0–110.0)
PO2 ARTERIAL: 108 mmHg (ref 80.0–110.0)
PO2 ARTERIAL: 110 mmHg (ref 80.0–110.0)
PO2 ARTERIAL: 112 mmHg — ABNORMAL HIGH (ref 80.0–110.0)
PO2 ARTERIAL: 260 mmHg — ABNORMAL HIGH (ref 80.0–110.0)
PO2 ARTERIAL: 94 mmHg (ref 80.0–110.0)
PO2 ARTERIAL: 94.7 mmHg (ref 80.0–110.0)
POTASSIUM WHOLE BLOOD: 2.6 mmol/L — CL (ref 3.4–4.6)
POTASSIUM WHOLE BLOOD: 2.8 mmol/L — ABNORMAL LOW (ref 3.4–4.6)
POTASSIUM WHOLE BLOOD: 3.1 mmol/L — ABNORMAL LOW (ref 3.4–4.6)
POTASSIUM WHOLE BLOOD: 3.3 mmol/L — ABNORMAL LOW (ref 3.4–4.6)
POTASSIUM WHOLE BLOOD: 3.3 mmol/L — ABNORMAL LOW (ref 3.4–4.6)
POTASSIUM WHOLE BLOOD: 3.6 mmol/L (ref 3.4–4.6)
POTASSIUM WHOLE BLOOD: 4.2 mmol/L (ref 3.4–4.6)
SODIUM WHOLE BLOOD: 144 mmol/L (ref 135–145)
SODIUM WHOLE BLOOD: 146 mmol/L — ABNORMAL HIGH (ref 135–145)
SODIUM WHOLE BLOOD: 147 mmol/L — ABNORMAL HIGH (ref 135–145)
SODIUM WHOLE BLOOD: 147 mmol/L — ABNORMAL HIGH (ref 135–145)
SODIUM WHOLE BLOOD: 147 mmol/L — ABNORMAL HIGH (ref 135–145)
SODIUM WHOLE BLOOD: 147 mmol/L — ABNORMAL HIGH (ref 135–145)
SODIUM WHOLE BLOOD: 148 mmol/L — ABNORMAL HIGH (ref 135–145)

## 2024-03-13 LAB — COMPREHENSIVE METABOLIC PANEL
ALBUMIN: 3 g/dL — ABNORMAL LOW (ref 3.4–5.0)
ALBUMIN: 2.8 g/dL — ABNORMAL LOW (ref 3.4–5.0)
ALBUMIN: 2.9 g/dL — ABNORMAL LOW (ref 3.4–5.0)
ALKALINE PHOSPHATASE: 96 U/L (ref 46–116)
ALKALINE PHOSPHATASE: 106 U/L (ref 46–116)
ALKALINE PHOSPHATASE: 113 U/L (ref 46–116)
ALT (SGPT): 25 U/L (ref 10–49)
ALT (SGPT): 32 U/L (ref 10–49)
ALT (SGPT): 39 U/L (ref 10–49)
ANION GAP: 13 mmol/L (ref 5–14)
ANION GAP: 10 mmol/L (ref 5–14)
ANION GAP: 8 mmol/L (ref 5–14)
AST (SGOT): 28 U/L (ref <=34)
AST (SGOT): 29 U/L (ref <=34)
AST (SGOT): 29 U/L (ref <=34)
BILIRUBIN TOTAL: 2.6 mg/dL — ABNORMAL HIGH (ref 0.3–1.2)
BILIRUBIN TOTAL: 3.1 mg/dL — ABNORMAL HIGH (ref 0.3–1.2)
BILIRUBIN TOTAL: 2.5 mg/dL — ABNORMAL HIGH (ref 0.3–1.2)
BLOOD UREA NITROGEN: 14 mg/dL (ref 9–23)
BLOOD UREA NITROGEN: 15 mg/dL (ref 9–23)
BLOOD UREA NITROGEN: 14 mg/dL (ref 9–23)
BUN / CREAT RATIO: 15
BUN / CREAT RATIO: 18
BUN / CREAT RATIO: 19
CALCIUM: 9.4 mg/dL (ref 8.7–10.4)
CALCIUM: 8.7 mg/dL (ref 8.7–10.4)
CALCIUM: 8.9 mg/dL (ref 8.7–10.4)
CHLORIDE: 101 mmol/L (ref 98–107)
CHLORIDE: 105 mmol/L (ref 98–107)
CHLORIDE: 107 mmol/L (ref 98–107)
CO2: 33 mmol/L — ABNORMAL HIGH (ref 20.0–31.0)
CO2: 35 mmol/L — ABNORMAL HIGH (ref 20.0–31.0)
CO2: 36 mmol/L — ABNORMAL HIGH (ref 20.0–31.0)
CREATININE: 0.94 mg/dL (ref 0.55–1.02)
CREATININE: 0.83 mg/dL (ref 0.55–1.02)
CREATININE: 0.74 mg/dL (ref 0.55–1.02)
EGFR CKD-EPI (2021) FEMALE: 63 mL/min/1.73m2 (ref >=60)
EGFR CKD-EPI (2021) FEMALE: 73 mL/min/1.73m2 (ref >=60)
EGFR CKD-EPI (2021) FEMALE: 83 mL/min/1.73m2 (ref >=60)
GLUCOSE RANDOM: 136 mg/dL (ref 70–179)
GLUCOSE RANDOM: 134 mg/dL (ref 70–179)
GLUCOSE RANDOM: 138 mg/dL (ref 70–179)
POTASSIUM: 3 mmol/L — ABNORMAL LOW (ref 3.4–4.8)
POTASSIUM: 3.7 mmol/L (ref 3.4–4.8)
POTASSIUM: 4.2 mmol/L (ref 3.4–4.8)
PROTEIN TOTAL: 5.5 g/dL — ABNORMAL LOW (ref 5.7–8.2)
PROTEIN TOTAL: 5.3 g/dL — ABNORMAL LOW (ref 5.7–8.2)
PROTEIN TOTAL: 5.5 g/dL — ABNORMAL LOW (ref 5.7–8.2)
SODIUM: 147 mmol/L — ABNORMAL HIGH (ref 135–145)
SODIUM: 150 mmol/L — ABNORMAL HIGH (ref 135–145)
SODIUM: 151 mmol/L — ABNORMAL HIGH (ref 135–145)

## 2024-03-13 LAB — CBC W/ AUTO DIFF
BASOPHILS ABSOLUTE COUNT: 0.1 10*9/L (ref 0.0–0.1)
BASOPHILS RELATIVE PERCENT: 0.8 %
EOSINOPHILS ABSOLUTE COUNT: 0.1 10*9/L (ref 0.0–0.5)
EOSINOPHILS RELATIVE PERCENT: 1.7 %
HEMATOCRIT: 33.3 % — ABNORMAL LOW (ref 34.0–44.0)
HEMOGLOBIN: 11.4 g/dL (ref 11.3–14.9)
LYMPHOCYTES ABSOLUTE COUNT: 1.7 10*9/L (ref 1.1–3.6)
LYMPHOCYTES RELATIVE PERCENT: 21.4 %
MEAN CORPUSCULAR HEMOGLOBIN CONC: 34.2 g/dL (ref 32.0–36.0)
MEAN CORPUSCULAR HEMOGLOBIN: 32.9 pg — ABNORMAL HIGH (ref 25.9–32.4)
MEAN CORPUSCULAR VOLUME: 96.2 fL — ABNORMAL HIGH (ref 77.6–95.7)
MEAN PLATELET VOLUME: 8.9 fL (ref 6.8–10.7)
MONOCYTES ABSOLUTE COUNT: 1.3 10*9/L — ABNORMAL HIGH (ref 0.3–0.8)
MONOCYTES RELATIVE PERCENT: 16.4 %
NEUTROPHILS ABSOLUTE COUNT: 4.7 10*9/L (ref 1.8–7.8)
NEUTROPHILS RELATIVE PERCENT: 59.7 %
PLATELET COUNT: 60 10*9/L — ABNORMAL LOW (ref 150–450)
RED BLOOD CELL COUNT: 3.46 10*12/L — ABNORMAL LOW (ref 3.95–5.13)
RED CELL DISTRIBUTION WIDTH: 15.7 % — ABNORMAL HIGH (ref 12.2–15.2)
WBC ADJUSTED: 7.9 10*9/L (ref 3.6–11.2)

## 2024-03-13 LAB — SLIDE REVIEW

## 2024-03-13 LAB — PROTIME-INR
INR: 1.89
INR: 1.71
INR: 1.61
PROTIME: 21.6 s — ABNORMAL HIGH (ref 9.9–12.6)
PROTIME: 19.5 s — ABNORMAL HIGH (ref 9.9–12.6)
PROTIME: 18.3 s — ABNORMAL HIGH (ref 9.9–12.6)

## 2024-03-13 LAB — APTT
APTT: 35.6 s (ref 24.8–38.4)
APTT: 32.8 s (ref 24.8–38.4)
APTT: 34.5 s (ref 24.8–38.4)
HEPARIN CORRELATION: 0.2
HEPARIN CORRELATION: 0.2
HEPARIN CORRELATION: 0.2

## 2024-03-13 LAB — CBC
HEMATOCRIT: 34.1 % (ref 34.0–44.0)
HEMOGLOBIN: 11.2 g/dL — ABNORMAL LOW (ref 11.3–14.9)
MEAN CORPUSCULAR HEMOGLOBIN CONC: 32.8 g/dL (ref 32.0–36.0)
MEAN CORPUSCULAR HEMOGLOBIN: 32.2 pg (ref 25.9–32.4)
MEAN CORPUSCULAR VOLUME: 98.1 fL — ABNORMAL HIGH (ref 77.6–95.7)
MEAN PLATELET VOLUME: 9.1 fL (ref 6.8–10.7)
PLATELET COUNT: 53 10*9/L — ABNORMAL LOW (ref 150–450)
RED BLOOD CELL COUNT: 3.47 10*12/L — ABNORMAL LOW (ref 3.95–5.13)
RED CELL DISTRIBUTION WIDTH: 15.6 % — ABNORMAL HIGH (ref 12.2–15.2)
WBC ADJUSTED: 6.7 10*9/L (ref 3.6–11.2)

## 2024-03-13 LAB — FIBRINOGEN
FIBRINOGEN LEVEL: 187 mg/dL (ref 175–500)
FIBRINOGEN LEVEL: 212 mg/dL (ref 175–500)
FIBRINOGEN LEVEL: 208 mg/dL (ref 175–500)

## 2024-03-13 LAB — D-DIMER, QUANTITATIVE
D-DIMER QUANTITATIVE (ACL TOP): 413 ng{FEU}/mL (ref <=500)
D-DIMER QUANTITATIVE (ACL TOP): 644 ng{FEU}/mL — ABNORMAL HIGH (ref <=500)
D-DIMER QUANTITATIVE (ACL TOP): 467 ng{FEU}/mL (ref <=500)

## 2024-03-13 LAB — MAGNESIUM
MAGNESIUM: 2 mg/dL (ref 1.6–2.6)
MAGNESIUM: 2 mg/dL (ref 1.6–2.6)
MAGNESIUM: 2 mg/dL (ref 1.6–2.6)

## 2024-03-13 LAB — PLATELET COUNT: PLATELET COUNT: 57 10*9/L — ABNORMAL LOW (ref 150–450)

## 2024-03-13 MED ADMIN — thiamine mononitrate (vit B1) tablet 100 mg: 100 mg | GASTROENTERAL | @ 12:00:00

## 2024-03-13 MED ADMIN — heparin (porcine) 5,000 unit/mL injection 5,000 Units: 5000 [IU] | SUBCUTANEOUS | @ 18:00:00

## 2024-03-13 MED ADMIN — piperacillin-tazobactam (ZOSYN) IVPB (premix) 4.5 g: 4.5 g | INTRAVENOUS | @ 11:00:00 | Stop: 2024-03-13

## 2024-03-13 MED ADMIN — potassium chloride (KLOR-CON) packet 40 mEq: 40 meq | ORAL | @ 22:00:00 | Stop: 2024-03-13

## 2024-03-13 MED ADMIN — piperacillin-tazobactam (ZOSYN) 3.375 g in sodium chloride 0.9 % (NS) 100 mL IVPB-MBP: 3.375 g | INTRAVENOUS | @ 18:00:00 | Stop: 2024-03-19

## 2024-03-13 MED ADMIN — anastrozole (ARIMIDEX) tablet 1 mg: 1 mg | GASTROENTERAL | @ 12:00:00

## 2024-03-13 MED ADMIN — albumin human 25 % 50 g: 50 g | INTRAVENOUS | @ 03:00:00 | Stop: 2024-03-12

## 2024-03-13 MED ADMIN — folic acid (FOLVITE) tablet 1 mg: 1 mg | GASTROENTERAL | @ 12:00:00

## 2024-03-13 MED ADMIN — piperacillin-tazobactam (ZOSYN) IVPB (premix) 4.5 g: 4.5 g | INTRAVENOUS | @ 04:00:00 | Stop: 2024-03-13

## 2024-03-13 MED ADMIN — heparin (porcine) 5,000 unit/mL injection 5,000 Units: 5000 [IU] | SUBCUTANEOUS | @ 14:00:00

## 2024-03-13 MED ADMIN — magnesium oxide (MAG-OX) tablet 400 mg: 400 mg | ORAL | @ 12:00:00

## 2024-03-13 MED ADMIN — pantoprazole (Protonix) injection 40 mg: 40 mg | INTRAVENOUS | @ 12:00:00

## 2024-03-13 MED ADMIN — magnesium oxide (MAG-OX) tablet 400 mg: 400 mg | ORAL

## 2024-03-13 MED ADMIN — cyanocobalamin (vitamin B-12) tablet 1,000 mcg: 1000 ug | GASTROENTERAL | @ 12:00:00

## 2024-03-13 MED ADMIN — potassium chloride 20 mEq in 100 mL IVPB Premix: 20 meq | INTRAVENOUS | @ 11:00:00 | Stop: 2024-03-13

## 2024-03-13 MED ADMIN — acetaminophen (TYLENOL) tablet 650 mg: 650 mg | ORAL | @ 21:00:00

## 2024-03-13 MED ADMIN — potassium chloride 20 mEq in 100 mL IVPB Premix: 20 meq | INTRAVENOUS | @ 12:00:00 | Stop: 2024-03-13

## 2024-03-13 MED ADMIN — multivitamins, therapeutic with minerals tablet 1 tablet: 1 | GASTROENTERAL | @ 12:00:00

## 2024-03-13 MED ADMIN — potassium chloride (KLOR-CON) packet 40 mEq: 40 meq | ORAL | @ 11:00:00 | Stop: 2024-03-13

## 2024-03-13 MED ADMIN — pantoprazole (Protonix) injection 40 mg: 40 mg | INTRAVENOUS

## 2024-03-13 NOTE — Unmapped (Signed)
 MICU Progress Note     Date of Service: 03/13/2024    Problem List:   Principal Problem:    Bacteremia, escherichia coli  Active Problems:    Alcoholic liver disease (HHS-HCC)    HTN (hypertension)    Depression with anxiety    Class 2 severe obesity with serious comorbidity in adult    Atrial fibrillation    (CMS-HCC)    Pleural effusion    Hypernatremia    Thrombocytopenia    Cirrhosis    (CMS-HCC)    A-fib (CMS-HCC)    Hepatic encephalopathy    (CMS-HCC)      Transfer Summary: [name] is a 78 y.o. 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 Found to have acute hypercarbic respiratory failure, acute hepatic encephalopathy, and hypothermia following procedure, prompting intubation. Transferred to MICU for further management.     24hr events  - remains off sedation  - PSV 8/5 all day  - off pressors since 0200    Neurological   Acute Toxic Metabolic Encephalopathy  Concern for Hepatic Encephalopathy  S/p Procedural Sedation  Patient presented for scheduled TEE/DCCV with Cardiology on 10/10, received 50 mg propofol  and found to be difficult to wake following sedation with hypercarbic respiratory failure. Per chart review, initially poorly responsive to stimulation and started on NIPPV with BiPAP, hypercarbia improved but remained altered eventually requiring intubation as below. Ddx for ongoing encephalopathy includes toxic metabolites 2/2 hypercarbia and electrolyte abnormalities, HE (not on lactulose ), infection/sepsis, ongoing sedation 2/2 pre-procedural propofol .  - Off prop since 10/11  - PRN fentanyl  for analgesia - not requiring many  - Management of HE as below  - Nonfocal but non-purposeful neuro exam    Analgesia: Pain adequately controlled  RASS at goal? 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 Found to have ABG w/ pH 7.28 / pCO2 86 / pO2 69. Transferred to CCU w/ pulm consult and placed on NIPPV. CXR with slight pulmonary edema, diuresed 1x w/ IV Lasix 40 mg. Hypercarbia corrected on BiPAP then AVAPS from 86 > 58 but ultimately required intubation due to acute hypoxic respiratory failure from hypoventilation likely due to encephalopathy.   - fairly minimal vent support  - mental status precluding extubation     Vent Mode: PSV-CPAP  S RR:  [10] 10  FiO2 (%):  [30 %] 30 %  PC Set:  [15] 15  PR SUP:  [8 cm H20-10 cm H20] 8 cm H20  O2 Device: Ventilator    Cardiovascular   Hypotension likely 2/2 sedation +/- infection  - s/p 2L fluid and albumin  - levophed off since 0200 10/12    Chronic HFpEF - HTN  Discharged 03/04/24 after several weeks worsening BLE edema, orthopnea and dyspnea, w/ Pro-BNP 850 and HFpEF on TTE. Dry weight 195 lbs. Established care w/ Cardiology since discharge, currently on diuretic PRN, MRA, coreg  at home. On admission, pro-BNP 520, slightly elevated from 490 at previous discharge but improved from 846 on 02/29/24. CXR with interstitial edema and bilateral pulmonary effusions. Diuresed  x 1 with IV lasix 40 mg with ~1.5L UOP on 10/9. Given 2L LR at OSH for sepsis on 10/10.  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 Started on Eliquis (cost-prohibitive due to not meeting deductible). Attempted TEE DCCV however aborted due to LAA thrombus.  - HOLD Eliquis 5 mg BID in setting of c/f GIB  - HOLD Coreg  BID in setting of acute infection  - Anticoag plan as below    Renal   AKI  Baseline Cr ~0.4. Creatinine elevated to 0.66 on admission.  - Strict I/Os  - Trend BMPs    Infectious Disease/Autoimmune   Sepsis  Afebrile upon arrival to scheduled procedure via axillary temp, but found to be hypothermic to 34.1C via foley catheter upon admission to OSH. Normotensive, non-tachycardic, without leukocytosis at the time. Hypothermia improved with Bair Hugger. Eventually sedated and intubated as above for acute hypoxic respiratory failure and encephalopathy. No fluid pocket for diagnostic paracentesis on POCUS. CXR with interstitial edema and bilateral pulmonary effusions but no focal consolidation. UA noninfectious. Blood cultures x 2 found to be positive for E. Coli. Lactate increased from 2 to 6.2 on recheck. MAPs have remained > 65, not on pressors. Started on Zosyn at OSH. Ddx for etiology GI translocation vs SBP.  - Continue Zosyn  - has GNR in blood           FEN/GI   MetALD Cirrhosis - Hx Hepatic Encephalopathy  History of decompensated metabolic-associated cirrhosis with HE, G1 EV and PHG on EGD 01/2018. Presented altered and encephalopathic following sedation as above. Ammonia level elevated 192 > 251 from previous level of 30. Liver doppler US  negative for acute thrombosis. Not on lactulose  prior to admission. Had been receiving lactulose  enemas at OSH. Concern for development of GI bleed on 10/10 AM as below, GI consulted at OSH with low suspicion for variceal bleed, recommend placing NG tube for lactulose  administration to prevent further bleed.  - Hepatology consult on arrival to [facility]  - Lactulose  via NG tube, goal 4-5x BM/day  - Continue diuretics as above  - Needs to establish care with outpatient GI clinic  - Daily MELD labs  - hepatology consult for cirrhosis mgmt +/- biopsy coordination    Hematochezia - Known G1 EV on EGD 2019  On 10/10, patient noted have brown stool followed by maroon stool with bright pink blood tinging the pad below. No hematemesis. HDS, not on pressors, Hgb stable. S/p PPI, ceftriaxone, and octreotide for GIB PPX. Per GI consult at OSH, low suspicion for variceal bleeding given HD stability and stable hemoglobin. Suspect related to rectal trauma from enemas or alternative lower GI bleeding source.  - IV PPI  - On Zosyn so can hold on further ceftriaxone PPX  - Monitor for further hematochezia  - Trend CBC  - 1u cryo 10/10    Provider Malnutrition Assessment:  Body mass index is 32.15 kg/m??.BMI Interpretation: >/= 30 and < 40, consistent with obesity, clinically significant requiring additional  resources and complicating multiple aspects of patient care.  GLIM criteria:   Pt does not meet criteria  -I have screened this patient for malnutrition and they did NOT meet criteria for malnutrition based on GLIM criteria.  -Nutrition consulted no  RD assessment:Not done yet.           Heme/Coag   Atrial Fibrillation - Left Atrial Appendage Thrombus (03/10/24)  Hematochezia  - Held home eliquis for now ISO hematochezia  - Restarted SQH ppx on 10/12 to monitor for bleeding - no signs of active bleeding but platelets hovering around 50 so will need to monitor and stop if drop below 50    Endocrine   - NAI    Prophylaxis/LDA/Restraints/Consults   ICU Checklist completed: yes (see ICU rounding navigator in Epic)      Patient Lines/Drains/Airways Status       Active Active Lines, Drains, & Airways       Name Placement date Placement time Site Days    ETT  7.5 03/11/24  0915  -- 2    CVC Triple Lumen 03/11/24 Non-tunneled Right Internal jugular 03/11/24  2200  Internal jugular  1    NG/OG Tube 16 Fr. Center mouth 03/11/24  1400  Center mouth  2    Urethral Catheter 03/10/24  1800  --  2    Peripheral IV 03/11/24 Left Antecubital 03/11/24  9092  Antecubital  2    Arterial Line 03/11/24 Left Radial 03/11/24  1845  Radial  1 Patient Lines/Drains/Airways Status       Active Wounds       Name Placement date Placement time Site Days    Wound 03/01/24 Other (Comment) Toe (Comment which one) Left;Other (Comment) Blister present on arrival, tx already in outpatient setting, in process of healing, surrounding erythema 03/01/24  0129  Toe (Comment which one)  12    Wound 03/13/24 Irritant Contact Dermatitis Incontinence Sacrum Mid gluteal cleft MASD? 03/13/24  --  Sacrum  less than 1                    Goals of Care     Code Status:   Orders Placed This Encounter   Procedures    Full Code     Standing Status:   Standing     Number of Occurrences:   1        Designated Healthcare Decision Maker:  Ms. Lesueur designated healthcare decision maker(s) is/are   HCDM (patient stated preference): Rexroad,[name] - Spouse - [phone]    HCDM, First AlternateIzabellah, Dadisman - Daughter - [phone]. See HCDM section of Epic sidebar/storyboard or ACP tab in patient chart for details regarding active HCDMs and patient capacity for decision-making.      Subjective     Intubated, calm    Objective     Vitals - past 24 hours  Pulse:  [61-108] 83  SpO2 Pulse:  [61-112] 78  Resp:  [10-24] 13  FiO2 (%):  [30 %] 30 %  SpO2:  [97 %-100 %] 98 % Intake/Output  I/O last 3 completed shifts:  In: 3333.8 [I.V.:840.5; NG/GT:860; IV Piggyback:1633.3]  Out: 1240 [Urine:1240]     Physical Exam:    General: chronically ill appearing, intubated  HEENT: dry mucous membranes  CV: normal rate, irreg rhythm, no m/r/g appreciated  Pulm: diminished bilaterally but clear  GI: soft, distended, non-tender  MSK: BLE edema present  Skin: scattered bruising throughout, fragile  Neuro: no focal deficits, moving to  pain, opens eyes spontaneously intermittently, sometimes unresponsive to sternal rub, MAE, non-focal      Continuous Infusions:   Infusions Meds[1]    Scheduled Medications:   Scheduled Medications[2]    PRN medications:  PRN Medications[3]    Data/Imaging Review: Reviewed in Epic and personally interpreted on 03/13/2024. See EMR for detailed results.       Critical Care Attestation     This patient is critically ill or injured with the impairment of vital organ systems such that there is a high probability of imminent or life threatening deterioration in the patient's condition. This patient must remain in the ICU for ongoing evaluation of the comprehensive management plan outlined in this note. I directly provided critical care services as documented in this note and the critical care time spent (50 min) is exclusive of separately billable procedures.  In addition to time spent for critical care management, I also provided advance care planning services for 0 minutes (see GOC above or ACP note for details)???. Total billable critical care time 50 minutes.    Geni DELENA Pan, PA                 [1]    NORepinephrine bitartrate-NS Stopped (03/13/24 0840)   [2]    anastrozole   1 mg Enteral tube: gastric Daily    [Provider Hold] carvedilol   3.125 mg Oral BID    cyanocobalamin (vitamin B-12)  1,000 mcg Enteral tube: gastric Daily    folic acid   1 mg Enteral tube: gastric Daily    heparin (porcine)  5,000 Units Subcutaneous Q8H [facility]    lactulose   20 g Enteral tube: gastric TID    magnesium  oxide  400 mg Oral BID    multivitamins (ADULT)  1 tablet Enteral tube: gastric Daily    pantoprazole (Protonix) intravenous solution  40 mg Intravenous BID    piperacillin-tazobactam  3.375 g Intravenous Q8H [facility]    thiamine  mononitrate (vit B1)  100 mg Enteral tube: gastric Daily   [3] acetaminophen , docusate sodium, fentaNYL  (PF) **OR** fentaNYL  (PF), haloperidol LACTATE, ondansetron , polyethylene glycol

## 2024-03-13 NOTE — Unmapped (Signed)
 MICU Evening Summary     Date of Service: 03/13/2024    Interval History: [name] is a 78 y.o. female with HFpEF, HTN, Afib on AC, MAFLD Cirrhosis, presented for scheduled TEE/DCCV (aborted d/t LAA clot), admitted 10/9 for [facility], encephalopathy and septic shock. Critical care services are indicated for:    Principal Problem:    Bacteremia, escherichia coli  Active Problems:    Alcoholic liver disease (HHS-HCC)    HTN (hypertension)    Depression with anxiety    Class 2 severe obesity with serious comorbidity in adult    Atrial fibrillation    (CMS-HCC)    Pleural effusion    Hypernatremia    Thrombocytopenia    Cirrhosis    (CMS-HCC)    A-fib (CMS-HCC)    Hepatic encephalopathy    (CMS-HCC)      Overnight events:   - maintain plan of care as outlined below    Assessment & Plan     PRIORITY:  - sbt in am, extubation?  - needs supplier assistance for Eliquis    78yo F w HFpEF, HTN, Afib on AC, MAFLD Cirrhosis, presented for scheduled TEE/DCCV (aborted d/t LAA clot), admitted 10/9 for [facility], encephalopathy and septic shock.    N:  - CT had naicp  - weaned off prop, cont prn fent   - initial ammonia 251, started lactulose   - mentation improved now [facility] non focal motor nodding head y/n oriented to self  - cont thiamine     P:  - intub 10/10 iso AMS, hypoxia, hypercarbia  - tol sbt settings, extubation precluded by mentation  - nearly self extubated, ett adv/resecured, pulled back 1.5cm  - tol sbt settings  - consider extubate in am    CV:    - hold home GDMT, torsemide, coreg , aldactone  - low dose levo, weaned off by 10/12  - hold eliquis for afib iso GIB    GI:   - hematochezia on admit, no further evid GIB  - CT a/p thick sigmoid colon c/f malignancy  - US  abd w sluggish hepatic flow but patent  - tol TF well     GU  - foley in situ  - stopped D5 for hypernatremia, inc FWF    H:  - hold heparin ppx and home eliquis iso GIB and downtrending plt  - cont folic  - last sryo 10/10    E:  - NAI    ID:  - cont zosyn for ecoli bacteremia  - last Bcx sent 10/12  - wocn c/s       Critical Care Attestation     This patient is critically ill or injured with the impairment of vital organ systems such that there is a high probability of imminent or life threatening deterioration in the patient's condition. This patient must remain in the ICU for ongoing evaluation of the comprehensive management plan outlined in this note. I directly provided critical care services as documented in this note and the critical care time spent (50 min) is exclusive of separately billable procedures.  In addition to time spent for critical care management, I also provided advance care planning services for 0 minutes (see GOC above or ACP note for details)???. Total billable critical care time 50 minutes.    Jacquis Paxton CHRISTELLA Zettie Molt, ACNP

## 2024-03-13 NOTE — Unmapped (Incomplete)
 RASS -3. Sats >90%

## 2024-03-13 NOTE — Unmapped (Signed)
 Problem: Skin Injury Risk Increased  Goal: Skin Health and Integrity  Intervention: Optimize Skin Protection  Recent Flowsheet Documentation  Taken 03/13/2024 0600 by Andrey Clarice RAMAN, RN  Pressure Reduction Techniques: heels elevated off bed  Head of Bed (HOB) Positioning: HOB at 30-45 degrees  Pressure Reduction Devices: heel offloading device utilized  Taken 03/13/2024 0400 by Andrey Clarice RAMAN, RN  Pressure Reduction Techniques: heels elevated off bed  Head of Bed (HOB) Positioning: HOB at 30-45 degrees  Pressure Reduction Devices: heel offloading device utilized  Taken 03/13/2024 0200 by Andrey Clarice RAMAN, RN  Pressure Reduction Techniques: heels elevated off bed  Head of Bed (HOB) Positioning: HOB at 30-45 degrees  Pressure Reduction Devices: heel offloading device utilized  Taken 03/13/2024 0000 by Andrey Clarice RAMAN, RN  Pressure Reduction Techniques: heels elevated off bed  Head of Bed (HOB) Positioning: HOB at 30-45 degrees  Pressure Reduction Devices: heel offloading device utilized  Taken 03/12/2024 2200 by Andrey Clarice RAMAN, RN  Pressure Reduction Techniques: heels elevated off bed  Head of Bed (HOB) Positioning: HOB at 30-45 degrees  Pressure Reduction Devices: heel offloading device utilized  Taken 03/12/2024 2000 by Andrey Clarice RAMAN, RN  Pressure Reduction Techniques: heels elevated off bed  Head of Bed (HOB) Positioning: HOB at 30-45 degrees  Pressure Reduction Devices: heel offloading device utilized     Problem: Adult Inpatient Plan of Care  Goal: Absence of Hospital-Acquired Illness or Injury  Intervention: Identify and Manage Fall Risk  Recent Flowsheet Documentation  Taken 03/12/2024 2000 by Andrey Clarice RAMAN, RN  Safety Interventions:   aspiration precautions   bed alarm   bleeding precautions   fall reduction program maintained   family at bedside   lighting adjusted for tasks/safety   low bed   muscle strengthening facilitated  Intervention: Prevent Skin Injury  Recent Flowsheet Documentation  Taken 03/13/2024 0600 by Andrey Clarice RAMAN, RN  Positioning for Skin: Left  Taken 03/13/2024 0400 by Andrey Clarice RAMAN, RN  Positioning for Skin: Right  Taken 03/13/2024 0200 by Andrey Clarice RAMAN, RN  Positioning for Skin: Left  Taken 03/13/2024 0000 by Andrey Clarice RAMAN, RN  Positioning for Skin: Right  Taken 03/12/2024 2200 by Andrey Clarice RAMAN, RN  Positioning for Skin: Left  Taken 03/12/2024 2000 by Andrey Clarice RAMAN, RN  Positioning for Skin: Right  Intervention: Prevent and Manage VTE (Venous Thromboembolism) Risk  Recent Flowsheet Documentation  Taken 03/13/2024 0600 by Andrey Clarice RAMAN, RN  Anti-Embolism Device Status: Other (Comment)  Taken 03/13/2024 0400 by Andrey Clarice RAMAN, RN  Anti-Embolism Device Status: Other (Comment)  Taken 03/13/2024 0200 by Andrey Clarice RAMAN, RN  Anti-Embolism Device Status: Other (Comment)  Taken 03/13/2024 0000 by Andrey Clarice RAMAN, RN  Anti-Embolism Device Status: Other (Comment)  Taken 03/12/2024 2000 by Andrey Clarice RAMAN, RN  Anti-Embolism Device Status: Other (Comment)  Intervention: Prevent Infection  Recent Flowsheet Documentation  Taken 03/12/2024 2000 by Andrey Clarice RAMAN, RN  Infection Prevention:   cohorting utilized   environmental surveillance performed     Problem: Breathing Pattern Ineffective  Goal: Effective Breathing Pattern  Intervention: Promote Improved Breathing Pattern  Recent Flowsheet Documentation  Taken 03/13/2024 0600 by Andrey Clarice RAMAN, RN  Head of Bed [facility]) Positioning: HOB at 30-45 degrees  Taken 03/13/2024 0400 by Andrey Clarice RAMAN, RN  Head of Bed [facility]) Positioning: HOB at 30-45 degrees  Taken 03/13/2024 0200 by Andrey Clarice RAMAN, RN  Head of Bed [facility]) Positioning: HOB at 30-45 degrees  Taken 03/13/2024 0000 by Andrey Clarice RAMAN, RN  Head of Bed [facility]) Positioning: HOB at 30-45 degrees  Taken 03/12/2024 2200 by Andrey Clarice RAMAN, RN  Head of Bed [facility]) Positioning: HOB at 30-45 degrees  Taken 03/12/2024 2000 by Andrey Clarice RAMAN, RN  Head of Bed [facility]) Positioning: HOB at 30-45 degrees     Problem: Fall Injury Risk  Goal: Absence of Fall and Fall-Related Injury  Intervention: Promote Injury-Free Environment  Recent Flowsheet Documentation  Taken 03/12/2024 2000 by Andrey Clarice RAMAN, RN  Safety Interventions:   aspiration precautions   bed alarm   bleeding precautions   fall reduction program maintained   family at bedside   lighting adjusted for tasks/safety   low bed   muscle strengthening facilitated     Problem: Gas Exchange Impaired  Goal: Optimal Gas Exchange  Intervention: Optimize Oxygenation and Ventilation  Recent Flowsheet Documentation  Taken 03/13/2024 0600 by Andrey Clarice RAMAN, RN  Head of Bed [facility]) Positioning: HOB at 30-45 degrees  Taken 03/13/2024 0400 by Andrey Clarice RAMAN, RN  Head of Bed [facility]) Positioning: HOB at 30-45 degrees  Taken 03/13/2024 0200 by Andrey Clarice RAMAN, RN  Head of Bed [facility]) Positioning: HOB at 30-45 degrees  Taken 03/13/2024 0000 by Andrey Clarice RAMAN, RN  Head of Bed [facility]) Positioning: HOB at 30-45 degrees  Taken 03/12/2024 2200 by Andrey Clarice RAMAN, RN  Head of Bed [facility]) Positioning: HOB at 30-45 degrees  Taken 03/12/2024 2000 by Andrey Clarice RAMAN, RN  Head of Bed [name]) Positioning: HOB at 30-45 degrees     Problem: Non-Violent Restraints  Intervention: Utilize least restrictive measures  Recent Flowsheet Documentation  Taken 03/13/2024 0400 by Andrey Clarice RAMAN, RN  Less Restrictive Alternative:   Repositioning   Comfort Measures  Taken 03/13/2024 0200 by Andrey Clarice RAMAN, RN  Less Restrictive Alternative:   Repositioning   Comfort Measures  Taken 03/13/2024 0000 by Andrey Clarice RAMAN, RN  Less Restrictive Alternative:   Repositioning   Comfort Measures  Taken 03/12/2024 2200 by Andrey Clarice RAMAN, RN  Less Restrictive Alternative:   Repositioning   Comfort Measures  Taken 03/12/2024 2000 by Andrey Clarice RAMAN, RN  Less Restrictive Alternative:   Repositioning   Comfort Measures  Intervention: Patient Monitoring  Recent Flowsheet Documentation  Taken 03/13/2024 0400 by Andrey Clarice RAMAN, RN  Psychological Status/Visual Check: Subdued  Circulation/Skin Integrity: No signs of injury  Range of Motion: Performed  Fluids: NPO  Food/Meal: Enteral feeding/TPN  Elimination: Urinary catheter  Taken 03/13/2024 0200 by Andrey Clarice RAMAN, RN  Psychological Status/Visual Check: Subdued  Circulation/Skin Integrity: No signs of injury  Range of Motion: Performed  Fluids: NPO  Food/Meal: Enteral feeding/TPN  Elimination: Urinary catheter  Taken 03/13/2024 0000 by Andrey Clarice RAMAN, RN  Psychological Status/Visual Check: Subdued  Circulation/Skin Integrity: No signs of injury  Range of Motion: Performed  Fluids: NPO  Food/Meal: Enteral feeding/TPN  Elimination: Urinary catheter  Taken 03/12/2024 2200 by Andrey Clarice RAMAN, RN  Psychological Status/Visual Check: Subdued  Circulation/Skin Integrity: No signs of injury  Range of Motion: Performed  Fluids: NPO  Food/Meal: Enteral feeding/TPN  Elimination: Urinary catheter  Taken 03/12/2024 2000 by Andrey Clarice RAMAN, RN  Psychological Status/Visual Check: Subdued  Circulation/Skin Integrity: No signs of injury  Range of Motion: Performed  Fluids: NPO  Food/Meal: Enteral feeding/TPN  Elimination: Urinary catheter  Intervention: Patient Education  Recent Flowsheet Documentation  Taken 03/13/2024 0400 by Andrey Clarice RAMAN, RN  Criteria Explained: Yes  Patient's Response: No evidence of learning  Family Notification: Other  Taken 03/13/2024 0200 by Andrey Clarice RAMAN, RN  Criteria Explained: Yes  Patient's Response: No evidence of learning  Family Notification: Other  Taken 03/13/2024 0000 by Andrey Clarice RAMAN, RN  Criteria Explained: Yes  Patient's Response: No evidence of learning  Family Notification: Other  Taken 03/12/2024 2200 by Andrey Clarice RAMAN, RN  Criteria Explained: Yes  Patient's Response: No evidence of learning  Family Notification: Other  Taken 03/12/2024 2000 by Andrey Clarice RAMAN, RN  Criteria Explained: Yes  Patient's Response: No evidence of learning  Family Notification: Other

## 2024-03-13 NOTE — Unmapped (Signed)
 Shift Summary  Pt remains intubated on 30% PS 8/5.  Moderate oral secretions.  Remains in BSW to protect tubes/drains.  Pt RASS -1 to +1; beginning to follow simple commands such as shaking head yes or no/sticking out tongue.  Pt unable to squeeze hands or wiggle toes to command.  Remains in afib.  Brief need for levo this am, paused since 0840.  Tolerating trickle TF.  Pt has met goal of 3 BMs in 24hr period.  Adequate UOP via foley.  Family at bedside this evening.  Bath/CHG completed.    Absence of Hospital-Acquired Illness or Injury: Skin and safety interventions were consistently maintained throughout the shift, including regular repositioning, absorbent pad changes, and use of side rails and bed alarms; aseptic technique and infection prevention measures were documented as maintained.    Optimal Comfort and Wellbeing: Pain scores remained low for most of the shift, but increased to 4 late in the afternoon with grimacing noted; acetaminophen  was administered PRN at that time.    Optimal Gas Exchange: Oxygen saturation remained stable and within normal limits, with FiO2 unchanged at 30% and humidified oxygen provided; arterial blood gases showed elevated PO2 and O2 saturation in the afternoon.    Blood Pressure in Desired Range: Blood pressure and MAP fluctuated early in the shift, with norepinephrine bitartrate-D5W administered and rate adjusted; values stabilized and remained within a higher range for the remainder of the shift.

## 2024-03-14 LAB — PHOSPHORUS: PHOSPHORUS: 2 mg/dL — ABNORMAL LOW (ref 2.4–5.1)

## 2024-03-14 LAB — BLOOD GAS CRITICAL CARE PANEL, ARTERIAL
BASE EXCESS ARTERIAL: 8.8 — ABNORMAL HIGH (ref -2.0–2.0)
BASE EXCESS ARTERIAL: 7.1 — ABNORMAL HIGH (ref -2.0–2.0)
BASE EXCESS ARTERIAL: 7.3 — ABNORMAL HIGH (ref -2.0–2.0)
BASE EXCESS ARTERIAL: 3 — ABNORMAL HIGH (ref -2.0–2.0)
BASE EXCESS ARTERIAL: 0.6 (ref -2.0–2.0)
BASE EXCESS ARTERIAL: 5.4 — ABNORMAL HIGH (ref -2.0–2.0)
CALCIUM IONIZED ARTERIAL (MG/DL): 4.96 mg/dL (ref 4.40–5.40)
CALCIUM IONIZED ARTERIAL (MG/DL): 4.84 mg/dL (ref 4.40–5.40)
CALCIUM IONIZED ARTERIAL (MG/DL): 4.87 mg/dL (ref 4.40–5.40)
CALCIUM IONIZED ARTERIAL (MG/DL): 4.44 mg/dL (ref 4.40–5.40)
CALCIUM IONIZED ARTERIAL (MG/DL): 4.43 mg/dL (ref 4.40–5.40)
CALCIUM IONIZED ARTERIAL (MG/DL): 4.62 mg/dL (ref 4.40–5.40)
CARBOXYHEMOGLOBIN: 2.1 % — ABNORMAL HIGH (ref <1.2)
CARBOXYHEMOGLOBIN: 2.1 % — ABNORMAL HIGH (ref <1.2)
CARBOXYHEMOGLOBIN: 1.6 % — ABNORMAL HIGH (ref <1.2)
CARBOXYHEMOGLOBIN: 1.5 % — ABNORMAL HIGH (ref <1.2)
CARBOXYHEMOGLOBIN: 1.9 % — ABNORMAL HIGH (ref <1.2)
CARBOXYHEMOGLOBIN: 1.3 % — ABNORMAL HIGH (ref <1.2)
CHLORIDE, WHOLE BLOOD: 112 mmol/L — ABNORMAL HIGH (ref 98–107)
CHLORIDE, WHOLE BLOOD: 113 mmol/L — ABNORMAL HIGH (ref 98–107)
CHLORIDE, WHOLE BLOOD: 114 mmol/L — ABNORMAL HIGH (ref 98–107)
CHLORIDE, WHOLE BLOOD: 122 mmol/L — ABNORMAL HIGH (ref 98–107)
GLUCOSE WHOLE BLOOD: 140 mg/dL (ref 70–179)
GLUCOSE WHOLE BLOOD: 102 mg/dL (ref 70–179)
GLUCOSE WHOLE BLOOD: 123 mg/dL (ref 70–179)
GLUCOSE WHOLE BLOOD: 103 mg/dL (ref 70–179)
GLUCOSE WHOLE BLOOD: 90 mg/dL (ref 70–179)
GLUCOSE WHOLE BLOOD: 89 mg/dL (ref 70–179)
HCO3 ARTERIAL: 34 mmol/L — ABNORMAL HIGH (ref 22–27)
HCO3 ARTERIAL: 31 mmol/L — ABNORMAL HIGH (ref 22–27)
HCO3 ARTERIAL: 33 mmol/L — ABNORMAL HIGH (ref 22–27)
HCO3 ARTERIAL: 29 mmol/L — ABNORMAL HIGH (ref 22–27)
HCO3 ARTERIAL: 26 mmol/L (ref 22–27)
HCO3 ARTERIAL: 32 mmol/L — ABNORMAL HIGH (ref 22–27)
HEMOGLOBIN BLOOD GAS: 11.2 g/dL — ABNORMAL LOW (ref 12.00–16.00)
HEMOGLOBIN BLOOD GAS: 10.7 g/dL — ABNORMAL LOW (ref 12.00–16.00)
HEMOGLOBIN BLOOD GAS: 11.5 g/dL — ABNORMAL LOW (ref 12.00–16.00)
HEMOGLOBIN BLOOD GAS: 10.2 g/dL — ABNORMAL LOW (ref 12.00–16.00)
HEMOGLOBIN BLOOD GAS: 8.5 g/dL — ABNORMAL LOW (ref 12.00–16.00)
HEMOGLOBIN BLOOD GAS: 10.1 g/dL — ABNORMAL LOW (ref 12.00–16.00)
LACTATE BLOOD ARTERIAL: 1 mmol/L (ref <1.3)
LACTATE BLOOD ARTERIAL: 1 mmol/L (ref <1.3)
LACTATE BLOOD ARTERIAL: 1.4 mmol/L — ABNORMAL HIGH (ref <1.3)
LACTATE BLOOD ARTERIAL: 0.9 mmol/L (ref <1.3)
LACTATE BLOOD ARTERIAL: 0.5 mmol/L (ref <1.3)
LACTATE BLOOD ARTERIAL: 0.6 mmol/L (ref <1.3)
METHEMOGLOBIN: <1 % (ref <1.5)
METHEMOGLOBIN: <1 % (ref <1.5)
METHEMOGLOBIN: <1 % (ref <1.5)
METHEMOGLOBIN: <1 % (ref <1.5)
METHEMOGLOBIN: <1 % (ref <1.5)
O2 SATURATION ARTERIAL: 99.1 % (ref 94.0–100.0)
O2 SATURATION ARTERIAL: 99.1 % (ref 94.0–100.0)
O2 SATURATION ARTERIAL: 98.4 % (ref 94.0–100.0)
O2 SATURATION ARTERIAL: 99.5 % (ref 94.0–100.0)
O2 SATURATION ARTERIAL: 99.7 % (ref 94.0–100.0)
O2 SATURATION ARTERIAL: 99.8 % (ref 94.0–100.0)
OXYHEMOGLOBIN: 96.3 % (ref 94.0–100.0)
OXYHEMOGLOBIN: 96.4 % (ref 94.0–100.0)
OXYHEMOGLOBIN: 96.6 % (ref 94.0–100.0)
OXYHEMOGLOBIN: 97.8 % (ref 94.0–100.0)
OXYHEMOGLOBIN: 97.4 % (ref 94.0–100.0)
OXYHEMOGLOBIN: 98.1 % (ref 94.0–100.0)
PCO2 ARTERIAL: 53.8 mmHg — ABNORMAL HIGH (ref 35.0–45.0)
PCO2 ARTERIAL: 45 mmHg (ref 35.0–45.0)
PCO2 ARTERIAL: 53.5 mmHg — ABNORMAL HIGH (ref 35.0–45.0)
PCO2 ARTERIAL: 51.3 mmHg — ABNORMAL HIGH (ref 35.0–45.0)
PCO2 ARTERIAL: 47.8 mmHg — ABNORMAL HIGH (ref 35.0–45.0)
PCO2 ARTERIAL: 55.2 mmHg — ABNORMAL HIGH (ref 35.0–45.0)
PH ARTERIAL: 7.41 (ref 7.35–7.45)
PH ARTERIAL: 7.46 — ABNORMAL HIGH (ref 7.35–7.45)
PH ARTERIAL: 7.4 (ref 7.35–7.45)
PH ARTERIAL: 7.36 (ref 7.35–7.45)
PH ARTERIAL: 7.35 (ref 7.35–7.45)
PH ARTERIAL: 7.37 (ref 7.35–7.45)
PO2 ARTERIAL: 118 mmHg — ABNORMAL HIGH (ref 80.0–110.0)
PO2 ARTERIAL: 118 mmHg — ABNORMAL HIGH (ref 80.0–110.0)
PO2 ARTERIAL: 103 mmHg (ref 80.0–110.0)
PO2 ARTERIAL: 139 mmHg — ABNORMAL HIGH (ref 80.0–110.0)
PO2 ARTERIAL: 152 mmHg — ABNORMAL HIGH (ref 80.0–110.0)
PO2 ARTERIAL: 169 mmHg — ABNORMAL HIGH (ref 80.0–110.0)
POTASSIUM WHOLE BLOOD: 3.6 mmol/L (ref 3.4–4.6)
POTASSIUM WHOLE BLOOD: 3.1 mmol/L — ABNORMAL LOW (ref 3.4–4.6)
POTASSIUM WHOLE BLOOD: 4.1 mmol/L (ref 3.4–4.6)
POTASSIUM WHOLE BLOOD: 3 mmol/L — ABNORMAL LOW (ref 3.4–4.6)
POTASSIUM WHOLE BLOOD: 2.7 mmol/L — ABNORMAL LOW (ref 3.4–4.6)
POTASSIUM WHOLE BLOOD: 3.2 mmol/L — ABNORMAL LOW (ref 3.4–4.6)
SODIUM WHOLE BLOOD: 148 mmol/L — ABNORMAL HIGH (ref 135–145)
SODIUM WHOLE BLOOD: 150 mmol/L — ABNORMAL HIGH (ref 135–145)
SODIUM WHOLE BLOOD: 155 mmol/L — ABNORMAL HIGH (ref 135–145)
SODIUM WHOLE BLOOD: 152 mmol/L — ABNORMAL HIGH (ref 135–145)
SODIUM WHOLE BLOOD: 153 mmol/L — ABNORMAL HIGH (ref 135–145)
SODIUM WHOLE BLOOD: 150 mmol/L — ABNORMAL HIGH (ref 135–145)

## 2024-03-14 LAB — AMMONIA: AMMONIA: 82 umol/L — ABNORMAL HIGH (ref 11–32)

## 2024-03-14 LAB — CBC W/ AUTO DIFF
BASOPHILS ABSOLUTE COUNT: 0 10*9/L (ref 0.0–0.1)
BASOPHILS RELATIVE PERCENT: 0.8 %
EOSINOPHILS ABSOLUTE COUNT: 0.2 10*9/L (ref 0.0–0.5)
EOSINOPHILS RELATIVE PERCENT: 2.9 %
HEMATOCRIT: 33 % — ABNORMAL LOW (ref 34.0–44.0)
HEMOGLOBIN: 11 g/dL — ABNORMAL LOW (ref 11.3–14.9)
LYMPHOCYTES ABSOLUTE COUNT: 1.4 10*9/L (ref 1.1–3.6)
LYMPHOCYTES RELATIVE PERCENT: 22.3 %
MEAN CORPUSCULAR HEMOGLOBIN CONC: 33.4 g/dL (ref 32.0–36.0)
MEAN CORPUSCULAR HEMOGLOBIN: 32.7 pg — ABNORMAL HIGH (ref 25.9–32.4)
MEAN CORPUSCULAR VOLUME: 97.9 fL — ABNORMAL HIGH (ref 77.6–95.7)
MEAN PLATELET VOLUME: 9.1 fL (ref 6.8–10.7)
MONOCYTES ABSOLUTE COUNT: 0.9 10*9/L — ABNORMAL HIGH (ref 0.3–0.8)
MONOCYTES RELATIVE PERCENT: 13.9 %
NEUTROPHILS ABSOLUTE COUNT: 3.7 10*9/L (ref 1.8–7.8)
NEUTROPHILS RELATIVE PERCENT: 60.1 %
PLATELET COUNT: 59 10*9/L — ABNORMAL LOW (ref 150–450)
RED BLOOD CELL COUNT: 3.37 10*12/L — ABNORMAL LOW (ref 3.95–5.13)
RED CELL DISTRIBUTION WIDTH: 15.5 % — ABNORMAL HIGH (ref 12.2–15.2)
WBC ADJUSTED: 6.2 10*9/L (ref 3.6–11.2)

## 2024-03-14 LAB — COMPREHENSIVE METABOLIC PANEL
ALBUMIN: 2.9 g/dL — ABNORMAL LOW (ref 3.4–5.0)
ALBUMIN: 2.7 g/dL — ABNORMAL LOW (ref 3.4–5.0)
ALBUMIN: 2.5 g/dL — ABNORMAL LOW (ref 3.4–5.0)
ALKALINE PHOSPHATASE: 116 U/L (ref 46–116)
ALKALINE PHOSPHATASE: 105 U/L (ref 46–116)
ALKALINE PHOSPHATASE: 95 U/L (ref 46–116)
ALT (SGPT): 41 U/L (ref 10–49)
ALT (SGPT): 40 U/L (ref 10–49)
ALT (SGPT): 34 U/L (ref 10–49)
ANION GAP: 11 mmol/L (ref 5–14)
ANION GAP: 9 mmol/L (ref 5–14)
ANION GAP: 9 mmol/L (ref 5–14)
AST (SGOT): 31 U/L (ref <=34)
AST (SGOT): 28 U/L (ref <=34)
AST (SGOT): 26 U/L (ref <=34)
BILIRUBIN TOTAL: 2.1 mg/dL — ABNORMAL HIGH (ref 0.3–1.2)
BILIRUBIN TOTAL: 1.8 mg/dL — ABNORMAL HIGH (ref 0.3–1.2)
BILIRUBIN TOTAL: 1.2 mg/dL (ref 0.3–1.2)
BLOOD UREA NITROGEN: 14 mg/dL (ref 9–23)
BLOOD UREA NITROGEN: 13 mg/dL (ref 9–23)
BLOOD UREA NITROGEN: 11 mg/dL (ref 9–23)
BUN / CREAT RATIO: 20
BUN / CREAT RATIO: 19
BUN / CREAT RATIO: 20
CALCIUM: 9.3 mg/dL (ref 8.7–10.4)
CALCIUM: 8.8 mg/dL (ref 8.7–10.4)
CALCIUM: 8.6 mg/dL — ABNORMAL LOW (ref 8.7–10.4)
CHLORIDE: 108 mmol/L — ABNORMAL HIGH (ref 98–107)
CHLORIDE: 112 mmol/L — ABNORMAL HIGH (ref 98–107)
CHLORIDE: 111 mmol/L — ABNORMAL HIGH (ref 98–107)
CO2: 34 mmol/L — ABNORMAL HIGH (ref 20.0–31.0)
CO2: 34 mmol/L — ABNORMAL HIGH (ref 20.0–31.0)
CO2: 34 mmol/L — ABNORMAL HIGH (ref 20.0–31.0)
CREATININE: 0.7 mg/dL (ref 0.55–1.02)
CREATININE: 0.67 mg/dL (ref 0.55–1.02)
CREATININE: 0.56 mg/dL (ref 0.55–1.02)
EGFR CKD-EPI (2021) FEMALE: 89 mL/min/1.73m2 (ref >=60)
EGFR CKD-EPI (2021) FEMALE: 90 mL/min/1.73m2 (ref >=60)
EGFR CKD-EPI (2021) FEMALE: >90 mL/min/1.73m2 (ref >=60)
GLUCOSE RANDOM: 140 mg/dL (ref 70–179)
GLUCOSE RANDOM: 125 mg/dL (ref 70–179)
GLUCOSE RANDOM: 118 mg/dL (ref 70–179)
POTASSIUM: 3.7 mmol/L (ref 3.4–4.8)
POTASSIUM: 4.2 mmol/L (ref 3.4–4.8)
POTASSIUM: 3.6 mmol/L (ref 3.4–4.8)
PROTEIN TOTAL: 5.5 g/dL — ABNORMAL LOW (ref 5.7–8.2)
PROTEIN TOTAL: 5.3 g/dL — ABNORMAL LOW (ref 5.7–8.2)
PROTEIN TOTAL: 5.1 g/dL — ABNORMAL LOW (ref 5.7–8.2)
SODIUM: 153 mmol/L — ABNORMAL HIGH (ref 135–145)
SODIUM: 155 mmol/L — ABNORMAL HIGH (ref 135–145)
SODIUM: 154 mmol/L — ABNORMAL HIGH (ref 135–145)

## 2024-03-14 LAB — CYSTATIN C
CYSTATIN C: 1.57 mg/L — ABNORMAL HIGH (ref 0.64–1.23)
EGFR CKD-EPI (2012) CYSTATIN C FEMALE: 37 mL/min/1.73m2 — ABNORMAL LOW (ref >=60)

## 2024-03-14 LAB — MAGNESIUM
MAGNESIUM: 2 mg/dL (ref 1.6–2.6)
MAGNESIUM: 2 mg/dL (ref 1.6–2.6)
MAGNESIUM: 2 mg/dL (ref 1.6–2.6)

## 2024-03-14 LAB — CBC
HEMATOCRIT: 32 % — ABNORMAL LOW (ref 34.0–44.0)
HEMOGLOBIN: 10.4 g/dL — ABNORMAL LOW (ref 11.3–14.9)
MEAN CORPUSCULAR HEMOGLOBIN CONC: 32.4 g/dL (ref 32.0–36.0)
MEAN CORPUSCULAR HEMOGLOBIN: 32 pg (ref 25.9–32.4)
MEAN CORPUSCULAR VOLUME: 98.8 fL — ABNORMAL HIGH (ref 77.6–95.7)
MEAN PLATELET VOLUME: 9.4 fL (ref 6.8–10.7)
PLATELET COUNT: 55 10*9/L — ABNORMAL LOW (ref 150–450)
RED BLOOD CELL COUNT: 3.24 10*12/L — ABNORMAL LOW (ref 3.95–5.13)
RED CELL DISTRIBUTION WIDTH: 16.3 % — ABNORMAL HIGH (ref 12.2–15.2)
WBC ADJUSTED: 4.5 10*9/L (ref 3.6–11.2)

## 2024-03-14 LAB — PROTIME-INR
INR: 1.46
INR: 1.39
INR: 1.34
PROTIME: 16.6 s — ABNORMAL HIGH (ref 9.9–12.6)
PROTIME: 15.8 s — ABNORMAL HIGH (ref 9.9–12.6)
PROTIME: 15.3 s — ABNORMAL HIGH (ref 9.9–12.6)

## 2024-03-14 LAB — FIBRINOGEN
FIBRINOGEN LEVEL: 212 mg/dL (ref 175–500)
FIBRINOGEN LEVEL: 229 mg/dL (ref 175–500)
FIBRINOGEN LEVEL: 215 mg/dL (ref 175–500)

## 2024-03-14 LAB — D-DIMER, QUANTITATIVE
D-DIMER QUANTITATIVE (ACL TOP): 490 ng{FEU}/mL (ref <=500)
D-DIMER QUANTITATIVE (ACL TOP): 556 ng{FEU}/mL — ABNORMAL HIGH (ref <=500)
D-DIMER QUANTITATIVE (ACL TOP): 609 ng{FEU}/mL — ABNORMAL HIGH (ref <=500)

## 2024-03-14 LAB — PLATELET COUNT: PLATELET COUNT: 62 10*9/L — ABNORMAL LOW (ref 150–450)

## 2024-03-14 LAB — APTT
APTT: 34.1 s (ref 24.8–38.4)
APTT: 31.3 s (ref 24.8–38.4)
APTT: 30.6 s (ref 24.8–38.4)
HEPARIN CORRELATION: 0.2
HEPARIN CORRELATION: 0.2
HEPARIN CORRELATION: 0.2

## 2024-03-14 LAB — SLIDE REVIEW

## 2024-03-14 MED ADMIN — heparin (porcine) 5,000 unit/mL injection 5,000 Units: 5000 [IU] | SUBCUTANEOUS | @ 01:00:00

## 2024-03-14 MED ADMIN — magnesium oxide (MAG-OX) tablet 400 mg: 400 mg | ORAL | @ 01:00:00

## 2024-03-14 MED ADMIN — pantoprazole (Protonix) injection 40 mg: 40 mg | INTRAVENOUS | @ 01:00:00

## 2024-03-14 MED ADMIN — ceFAZolin (ANCEF) IVPB 2 g in 50 ml dextrose (premix): 2 g | INTRAVENOUS | @ 13:00:00 | Stop: 2024-03-18

## 2024-03-14 MED ADMIN — thiamine mononitrate (vit B1) tablet 100 mg: 100 mg | GASTROENTERAL | @ 12:00:00

## 2024-03-14 MED ADMIN — potassium chloride (KLOR-CON) packet 40 mEq: 40 meq | ORAL | @ 12:00:00 | Stop: 2024-03-14

## 2024-03-14 MED ADMIN — pantoprazole (Protonix) injection 40 mg: 40 mg | INTRAVENOUS | @ 12:00:00 | Stop: 2024-03-14

## 2024-03-14 MED ADMIN — ceFAZolin (ANCEF) IVPB 2 g in 50 ml dextrose (premix): 2 g | INTRAVENOUS | @ 17:00:00 | Stop: 2024-03-18

## 2024-03-14 MED ADMIN — piperacillin-tazobactam (ZOSYN) 3.375 g in sodium chloride 0.9 % (NS) 100 mL IVPB-MBP: 3.375 g | INTRAVENOUS | @ 09:00:00 | Stop: 2024-03-14

## 2024-03-14 MED ADMIN — folic acid (FOLVITE) tablet 1 mg: 1 mg | GASTROENTERAL | @ 12:00:00

## 2024-03-14 MED ADMIN — magnesium oxide (MAG-OX) tablet 400 mg: 400 mg | ORAL | @ 12:00:00

## 2024-03-14 MED ADMIN — lactulose oral solution: 20 g | GASTROENTERAL | @ 12:00:00

## 2024-03-14 MED ADMIN — fentaNYL (PF) (SUBLIMAZE) injection 25 mcg: 25 ug | INTRAVENOUS | @ 02:00:00 | Stop: 2024-03-18

## 2024-03-14 MED ADMIN — fentaNYL (PF) (SUBLIMAZE) injection 25 mcg: 25 ug | INTRAVENOUS | @ 03:00:00 | Stop: 2024-03-18

## 2024-03-14 MED ADMIN — lactulose oral solution: 20 g | GASTROENTERAL | @ 01:00:00

## 2024-03-14 MED ADMIN — anastrozole (ARIMIDEX) tablet 1 mg: 1 mg | GASTROENTERAL | @ 12:00:00

## 2024-03-14 MED ADMIN — piperacillin-tazobactam (ZOSYN) 3.375 g in sodium chloride 0.9 % (NS) 100 mL IVPB-MBP: 3.375 g | INTRAVENOUS | @ 01:00:00 | Stop: 2024-03-19

## 2024-03-14 MED ADMIN — multivitamins, therapeutic with minerals tablet 1 tablet: 1 | GASTROENTERAL | @ 12:00:00

## 2024-03-14 MED ADMIN — cyanocobalamin (vitamin B-12) tablet 1,000 mcg: 1000 ug | GASTROENTERAL | @ 12:00:00

## 2024-03-14 NOTE — Unmapped (Signed)
 Hepatology Consult Service   Progress Note         Assessment and Recommendations:   [name] is a 78 y.o. 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 Found to have acute hypercarbic respiratory failure, acute hepatic encephalopathy, and hypothermia following procedure, prompting intubation. The patient is seen in consultation at the request of Elsie Ole Constant, MD (Medical ICU (MDI)) for decompensated cirrhosis with hepatic encephalopathy and colonic mass.    Decompensated MASLD cirrhosis c/b HE  Patient presented for scheduled TEE/DCCV with Cardiology on 10/10, received 50 mg propofol  and found to be difficult to wake following sedation with hypercarbic respiratory failure. Per chart review, initially poorly responsive to stimulation and started on NIPPV with BiPAP, hypercarbia improved but remained altered eventually requiring intubation.  Successfully extubated 10/13.    Encephalopathy: not on home medication  Differential for ongoing encephalopathy includes toxic metabolites 2/2 hypercarbia and electrolyte abnormalities, HE (not on lactulose ), splenorenal shunt, infection/sepsis, ongoing sedation 2/2 pre-procedural propofol .   - Continue lactulose  20mg  TID  - Please administer IVF to correct free water  deficit (5.7L); can administer via tube feeding bag connected to NGT    Volume: home regimen - lasix 40mg , spiro 25mg   - Holding in the setting of severe electrolyte abnormalities    Infection: E coli bacteremia (?perhaps colonic source given mass)  - Continue IV cefazolin  (10/12 - )   - S/p IV Zosyn (10/10- 10/12)  - 2/2 Bcx 10/10 positive for pan-sensitive E coli  - No ascites on POCUS    Bleeding: Last EGD 01/2018 significant for grade I esophageal varices and portal hypertensive gastropathy  - Due for outpatient screening    MELD 3.0: 14 at 03/14/2024 10:52 AM  MELD-Na: 12 at 03/14/2024 10:52 AM  Calculated from:  Serum Creatinine: 0.67 mg/dL (Using min of 1 mg/dL) at 89/86/7974 89:47 AM  Serum Sodium: 155 mmol/L (Using max of 137 mmol/L) at 03/14/2024 10:52 AM  Total Bilirubin: 1.8 mg/dL at 89/86/7974 89:47 AM  Serum Albumin: 2.7 g/dL at 89/86/7974 89:47 AM  INR(ratio): 1.39 at 03/14/2024 10:52 AM  Age at listing (hypothetical): 78 years  Sex: Female at 03/14/2024 10:52 AM    Sigmoid mass  CT AP demonstrates mass-like thickening involving the lower sigmoid colon, concerning for possible malignancy and potentially source of GNR bloodstream infection.  Per chart review, last colonoscopy in 1998, and patient has declined surveillance since then.  Will continue conversations regarding overall goals of care and endoscopic evaluation, knowing that if colon cancer were found patient would not be a candidate for surgery.  - Further goals of care conversation to be had when mental status improves    Issues Impacting Complexity of Management:  -The patient has the need for intensive monitoring parameter(s) due to high-risk of clinical decline: frequent monitoring of MELD score to monitor for progression to/progression of acute or acute on chronic liver failure    Recommendations discussed with the patient's primary team. We will continue to follow along with you.    Subjective:   Remained extubated on morning rounds.  Sodium 153.  Not on any vasopressors.  Continues on antibiotics for E. coli bacteremia.  Demonstrated some bright red blood per rectum.    -I have reviewed the  patient's prior records from [facility], [facility] as summarized in the HPI    Objective:   Pulse:  [73-108] 84  SpO2 Pulse:  [74-109] 85  Resp:  [11-27] 20  FiO2 (%):  [30 %] 30 %  SpO2:  [96 %-100 %] 100 %    Gen: Chronically ill-appearing female in NAD, mechanically ventilated  Eyes: Sclera anicteric  Abdomen: Normoactive bowel sounds, soft, NTND, no rebound/guarding, no hepatosplenomegaly  Extremities: No clubbing, cyanosis, or edema in the BLEs  Neuro: intubated and sedated    Pertinent Labs & Studies:  -I have reviewed the patient's labs from 03/14/24 which show stable Hgb, stable renal function (SCr, electrolytes), and stable LFTs    Prior GI work-up:  02/03/2018 EGD (cirrhosis): Grade 1 varices seen 2 columns.  PHG in the entire stomach, retained fluid in the stomach.  Normal duodenum    CT AP 03/11/24:  Impression   --Left basilar pulmonary opacity which is favored to predominantly reflect atelectasis. There is a moderate right and trace left pleural effusion.      --There is masslike thickening involving the lower sigmoid colon concerning for colonic malignancy. Further evaluation with nonemergent colonoscopy is recommended.      -There is cirrhotic morphology with multiple varices in the left hemiabdomen as well as a splenorenal shunt.

## 2024-03-14 NOTE — Unmapped (Signed)
 MICU Evening Summary     Date of Service: 03/14/2024    Interval History: SHANE MELBY is a 78 y.o. female with HFpEF, HTN, Afib on AC, MAFLD Cirrhosis, presented for scheduled TEE/DCCV (aborted d/t LAA clot), admitted 10/9 for [facility], encephalopathy and septic shock. Critical care services are indicated for:    Principal Problem:    Bacteremia, escherichia coli  Active Problems:    Alcoholic liver disease (HHS-HCC)    HTN (hypertension)    Depression with anxiety    Class 2 severe obesity with serious comorbidity in adult    Atrial fibrillation    (CMS-HCC)    Pleural effusion    Hypernatremia    Thrombocytopenia    Cirrhosis    (CMS-HCC)    A-fib (CMS-HCC)    Hepatic encephalopathy    (CMS-HCC)      Overnight events:   - maintain plan of care as outlined below    Assessment & Plan     78yo F w HFpEF, HTN, Afib on AC, MAFLD Cirrhosis, presented for scheduled TEE/DCCV (aborted d/t LAA clot), admitted 10/9 for [facility], encephalopathy and septic shock.    N:  - CT had naicp  - weaned off prop, cont prn fent   - initial ammonia 251, started lactulose   - mentation improved now [facility] non focal motor nodding head y/n oriented to self  - cont thiamine     P:  - intub 10/10 iso AMS, hypoxia, hypercarbia  - extubated 10/13 to [place]  - goal sats>90    CV:    - hold home GDMT, torsemide, coreg , aldactone  - low dose levo, weaned off by 10/12  - hold eliquis for afib iso GIB    GI:   - hematochezia on admit, again 10/13 after starting SQH  - CT a/p thick sigmoid colon c/f malignancy  - US  abd w sluggish hepatic flow but patent  - TF w/ post pyloric corpak    GU  - foley in situ  - stopped D5 for hypernatremia, inc FWF to 350q4 post 10/13    H:  - hold heparin ppx and home eliquis iso GIB and downtrending plt  - cont folic  - last cryo 10/10    E:  - NAI    ID:  - cont zosyn for ecoli bacteremia  - last Bcx sent 10/12  - wocn c/s       Critical Care Attestation     This patient is critically ill or injured with the impairment of vital organ systems such that there is a high probability of imminent or life threatening deterioration in the patient's condition. This patient must remain in the ICU for ongoing evaluation of the comprehensive management plan outlined in this note. I directly provided critical care services as documented in this note and the critical care time spent (50 min) is exclusive of separately billable procedures.  In addition to time spent for critical care management, I also provided advance care planning services for 0 minutes (see GOC above or ACP note for details)???. Total billable critical care time 50 minutes.    Yahmir Sokolov CHRISTELLA Zettie Molt, ACNP

## 2024-03-14 NOTE — Unmapped (Signed)
Patient is currently on the ventilator and settings have decreased today.

## 2024-03-14 NOTE — Unmapped (Signed)
 Small Bore Feeding Tube Procedure  Note      DATE OF SERVICE: 03/14/2024    PROCEDURE:  Insertion of small bore feeding tube    INDICATION: gastric feeding    TIME OUT:     correct patient, side, site, procedure, position, equipment Yes    POSITION:            supine      SITE:                     right    Size:    67F    CORTRAK DEVICE USED: yes         DESCRIPTION:     Patient positioned in accordance with hospital protocol for tube placement, the front of the receiver unit was placed over the xiphoid process. The distal end of the stylet was connected to the monitor. The feeding tube (containing the stylet) was inserted via the nostril into the stomach or small intestine and advanced using visual guidance from monitor display. End of feeding tube is currently post pyloric per image noted on Cortrak monitor. KUB ordered for confirmation of tube.     SECUREMENT METHOD: Bridle    COMPLICATIONS:  none    SIGNATURE:             Garnette CHRISTELLA Mallick, RN

## 2024-03-14 NOTE — Unmapped (Signed)
 MICU Progress Note     Date of Service: 03/14/2024    Problem List:   Principal Problem:    Bacteremia, escherichia coli  Active Problems:    Alcoholic liver disease (HHS-HCC)    HTN (hypertension)    Depression with anxiety    Class 2 severe obesity with serious comorbidity in adult    Atrial fibrillation    (CMS-HCC)    Pleural effusion    Hypernatremia    Thrombocytopenia    Cirrhosis    (CMS-HCC)    A-fib (CMS-HCC)    Hepatic encephalopathy    (CMS-HCC)      Transfer Summary: [name] is a 78 y.o. 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 Found to have acute hypercarbic respiratory failure, acute hepatic encephalopathy, and hypothermia following procedure, prompting intubation. Transferred to MICU for further management.     24hr events  - extubated to [place]    Neurological   Acute Toxic Metabolic Encephalopathy  Concern for Hepatic Encephalopathy  S/p Procedural Sedation  Patient presented for scheduled TEE/DCCV with Cardiology on 10/10, received 50 mg propofol  and found to be difficult to wake following sedation with hypercarbic respiratory failure. Per chart review, initially poorly responsive to stimulation and started on NIPPV with BiPAP, hypercarbia improved but remained altered eventually requiring intubation as below. Ddx for ongoing encephalopathy includes toxic metabolites 2/2 hypercarbia and electrolyte abnormalities, HE (not on lactulose ), infection/sepsis, ongoing sedation 2/2 pre-procedural propofol .  - Off prop since 10/11, extubated 10/13  - PRN oxy  - Management of HE as below  - Nonfocal but non-purposeful neuro exam    Analgesia: Pain adequately controlled  RASS at goal? 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 Found to have ABG w/ pH 7.28 / pCO2 86 / pO2 69. Transferred to CCU w/ pulm consult and placed on NIPPV. CXR with slight pulmonary edema, diuresed 1x w/ IV Lasix 40 mg. Hypercarbia corrected on BiPAP then AVAPS from 86 > 58 but ultimately required intubation due to acute hypoxic respiratory failure from hypoventilation likely due to encephalopathy.   - extubated 10/13 to [place]  - spot checking gasses to eval for hypercarbia with borderline CO2 and sleepiness    Vent Mode: PSV-CPAP  FiO2 (%):  [30 %] 30 %  PR SUP:  [5 cm H20-10 cm H20] 8 cm H20  O2 Device: None (Room air)  O2 Flow Rate (L/min):  [2 L/min] 2 L/min    Cardiovascular   Hypotension likely 2/2 sedation +/- infection  - s/p 2L fluid and albumin  - levophed off since 0200 10/12    Chronic HFpEF - HTN  Discharged 03/04/24 after several weeks worsening BLE edema, orthopnea and dyspnea, w/ Pro-BNP 850 and HFpEF on TTE. Dry weight 195 lbs. Established care w/ Cardiology since discharge, currently on diuretic PRN, MRA, coreg  at home. On admission, pro-BNP 520, slightly elevated from 490 at previous discharge but improved from 846 on 02/29/24. CXR with interstitial edema and bilateral pulmonary effusions. Diuresed x 1 with IV lasix 40 mg with ~1.5L  UOP on 10/9. Given 2L LR at OSH for sepsis on 10/10.  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 Started on Eliquis (cost-prohibitive due to not meeting deductible). Attempted TEE DCCV however aborted due to LAA thrombus.  - HOLD Eliquis 5 mg BID in setting of c/f GIB  - HOLD Coreg  BID in setting of acute infection  - Anticoag plan as below    Renal   AKI  Baseline Cr ~0.4. Creatinine elevated to 0.66 on admission.  - Strict I/Os  - Trend BMPs    Infectious Disease/Autoimmune   Sepsis  Afebrile upon arrival to scheduled procedure via axillary temp, but found to be hypothermic to 34.1C via foley catheter upon admission to OSH. Normotensive, non-tachycardic, without leukocytosis at the time. Hypothermia improved with Bair Hugger. Eventually sedated and intubated as above for acute hypoxic respiratory failure and encephalopathy. No fluid pocket for diagnostic paracentesis on POCUS. CXR with interstitial edema and bilateral pulmonary effusions but no focal consolidation. UA noninfectious. Blood cultures x 2 found to be positive for E. Coli. Lactate increased from 2 to 6.2 on recheck. MAPs have remained > 65, not on pressors. Started on Zosyn at OSH. Ddx for etiology GI translocation vs SBP.  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 Presented altered and encephalopathic following sedation as above. Ammonia level elevated 192 > 251 from previous level of 30. Liver doppler US  negative for acute thrombosis. Not on lactulose  prior to admission. Had been receiving lactulose  enemas at OSH. Concern for development of GI bleed on 10/10 AM as below, GI consulted at OSH with low suspicion for variceal bleed, recommend placing NG tube for lactulose  administration to prevent further bleed.  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 No hematemesis. HDS, not on pressors, Hgb stable. S/p PPI, ceftriaxone, and octreotide for GIB PPX. Per GI consult at OSH, low suspicion for variceal bleeding given HD stability and stable hemoglobin. Suspect related to rectal trauma from enemas or alternative lower GI bleeding source.  - IV PPI  - On Zosyn so can hold on further ceftriaxone PPX  - Monitor for further hematochezia  - Trend CBC  - 1u cryo 10/10    Provider Malnutrition Assessment:  Body mass index is 32.15 kg/m??.BMI Interpretation: >/= 30 and < 40, consistent with obesity, clinically significant requiring additional resources and  complicating multiple aspects of patient care.  GLIM criteria:   Pt does not meet criteria  -I have screened this patient for malnutrition and they did NOT meet criteria for malnutrition based on GLIM criteria.  -Nutrition consulted no  RD assessment:Not done yet.           Heme/Coag   Atrial Fibrillation - Left Atrial Appendage Thrombus (03/10/24)  Hematochezia  - Held home eliquis for now ISO hematochezia  - Restarted SQH ppx on 10/12-10/13 to monitor for bleeding - continued hematochezia and some light red secretions after extubation. Held again.    Endocrine   - NAI    Prophylaxis/LDA/Restraints/Consults   ICU Checklist completed: yes (see ICU rounding navigator in Epic)      Patient Lines/Drains/Airways Status       Active Active Lines, Drains, & Airways       Name Placement date Placement time Site Days    CVC Triple Lumen 03/11/24 Non-tunneled Right Internal jugular 03/11/24  2200  Internal jugular  2    NG/OG Tube Feedings 10 Fr. Right nostril 03/14/24  1055  Right nostril  less than 1    Urethral Catheter 03/10/24  1800  --  3    Peripheral IV 03/11/24 Left Antecubital 03/11/24  9092  Antecubital  3    Arterial Line 03/11/24 Left Radial 03/11/24  1845 Radial  2                  Patient Lines/Drains/Airways Status       Active Wounds       Name Placement date Placement time Site Days    Wound 03/01/24 Other (Comment) Toe (Comment which one) Left;Other (Comment) Blister present on arrival, tx already in outpatient setting, in process of healing, surrounding erythema 03/01/24  0129  Toe (Comment which one)  13    Wound 03/13/24 Irritant Contact Dermatitis Incontinence Sacrum Mid gluteal cleft MASD? 03/13/24  --  Sacrum  1                    Goals of Care     Code Status:   Orders Placed This Encounter   Procedures    Full Code     Standing Status:   Standing     Number of Occurrences:   1        Designated Healthcare Decision Maker:  Ms. Bunney designated healthcare decision maker(s) is/are   HCDM (patient stated preference): Frate,[name] - Spouse - [phone]    HCDM, First AlternateJasma, Seevers - Daughter - [phone]. See HCDM section of Epic sidebar/storyboard or ACP tab in patient chart for details regarding active HCDMs and patient capacity for decision-making.      Subjective     Extubated, calm    Objective     Vitals - past 24 hours  Pulse:  [73-104] 80  SpO2 Pulse:  [74-107] 80  Resp:  [11-27] 18  FiO2 (%):  [30 %] 30 %  SpO2:  [96 %-100 %] 100 % Intake/Output  I/O last 3 completed shifts:  In: 2513.8 [P.O.:60; I.V.:116.3; NG/GT:1690; IV Piggyback:647.5]  Out: 2000 [Urine:2000]     Physical Exam:    General: chronically ill appearing, intubated  HEENT: dry mucous membranes  CV: normal rate, irreg rhythm, no m/r/g appreciated  Pulm: diminished bilaterally but clear, slightly increased WOB but protecting airway  GI: soft, distended, non-tender  MSK: BLE edema present  Skin: scattered bruising throughout, fragile  Neuro: no focal deficits, moving to  pain, opens eyes spontaneously intermittently, MAE, non-focal, FC though sluggish      Continuous Infusions:   Infusions Meds[1]    Scheduled Medications:   Scheduled Medications[2]    PRN medications:  PRN Medications[3]    Data/Imaging Review: Reviewed in Epic and personally interpreted on 03/14/2024. See EMR for detailed results.       Critical Care Attestation     This patient is critically ill or injured with the impairment of vital organ systems such that there is a high probability of imminent or life threatening deterioration in the patient's condition. This patient must remain in the ICU for ongoing evaluation of the comprehensive management plan outlined in this note. I directly provided critical care services as documented in this note and the critical care time spent (35 min) is exclusive of separately billable procedures.  In addition to time spent for critical care management, I also provided advance care planning services for 0 minutes (see GOC above or ACP note for details)???. Total billable critical care time 35 minutes.    Geni DELENA Pan, PA                   [1] [2]    anastrozole   1 mg Enteral tube: gastric Daily    [Provider Hold] carvedilol   3.125 mg Oral BID    ceFAZolin   2 g Intravenous Q8H SCH    cyanocobalamin (vitamin B-12)  1,000 mcg Enteral tube: gastric Daily    flu vac 2025 [id number](PF)  0.5 mL Intramuscular During hospitalization    folic acid   1 mg Enteral tube: gastric Daily    [Provider Hold] heparin (porcine)  5,000 Units Subcutaneous Q8H [facility]    lactulose   20 g Enteral tube: gastric TID    magnesium  oxide  400 mg Oral BID    multivitamins (ADULT)  1 tablet Enteral tube: gastric Daily    [START ON 03/15/2024] pantoprazole  40 mg Enteral tube: gastric Daily    thiamine  mononitrate (vit B1)  100 mg Enteral tube: gastric Daily   [3] acetaminophen , docusate sodium, ondansetron , oxyCODONE , polyethylene glycol

## 2024-03-14 NOTE — Unmapped (Addendum)
 RASS 0 tp -1. Follows commands. Sats >90% on 30% PS. Minimal secretions. Plan to extubate.  UOP adequate. No BM. TF running at goal without complications. Standard precautions maintained. No falls/injuries this shift. See MAR/Flowsheets for more info.          Problem: Skin Injury Risk Increased  Goal: Skin Health and Integrity  Intervention: Optimize Skin Protection  Recent Flowsheet Documentation  Taken 03/14/2024 0400 by Andrey Clarice RAMAN, RN  Pressure Reduction Techniques: heels elevated off bed  Head of Bed (HOB) Positioning: HOB at 30-45 degrees  Pressure Reduction Devices: heel offloading device utilized  Taken 03/14/2024 0200 by Andrey Clarice RAMAN, RN  Pressure Reduction Techniques: heels elevated off bed  Head of Bed (HOB) Positioning: HOB at 30-45 degrees  Pressure Reduction Devices: heel offloading device utilized  Taken 03/14/2024 0000 by Andrey Clarice RAMAN, RN  Pressure Reduction Techniques: heels elevated off bed  Head of Bed (HOB) Positioning: HOB at 30-45 degrees  Pressure Reduction Devices: heel offloading device utilized  Taken 03/13/2024 2200 by Andrey Clarice RAMAN, RN  Pressure Reduction Techniques: heels elevated off bed  Head of Bed (HOB) Positioning: HOB at 30-45 degrees  Pressure Reduction Devices: heel offloading device utilized  Taken 03/13/2024 2000 by Andrey Clarice RAMAN, RN  Pressure Reduction Techniques: heels elevated off bed  Head of Bed (HOB) Positioning: HOB at 30-45 degrees  Pressure Reduction Devices: heel offloading device utilized  Skin Protection: adhesive use limited     Problem: Adult Inpatient Plan of Care  Goal: Absence of Hospital-Acquired Illness or Injury  Intervention: Identify and Manage Fall Risk  Recent Flowsheet Documentation  Taken 03/13/2024 2000 by Andrey Clarice RAMAN, RN  Safety Interventions:   environmental modification   fall reduction program maintained   enteral feeding safety   aspiration precautions  Intervention: Prevent Skin Injury  Recent Flowsheet Documentation  Taken 03/14/2024 0400 by Andrey Clarice RAMAN, RN  Positioning for Skin: Right  Taken 03/14/2024 0200 by Andrey Clarice RAMAN, RN  Positioning for Skin: Left  Taken 03/14/2024 0000 by Andrey Clarice RAMAN, RN  Positioning for Skin: Right  Taken 03/13/2024 2200 by Andrey Clarice RAMAN, RN  Positioning for Skin: Left  Taken 03/13/2024 2000 by Andrey Clarice RAMAN, RN  Positioning for Skin: Right  Device Skin Pressure Protection:   absorbent pad utilized/changed   pressure points protected  Skin Protection: adhesive use limited  Intervention: Prevent Infection  Recent Flowsheet Documentation  Taken 03/13/2024 2000 by Andrey Clarice RAMAN, RN  Infection Prevention: cohorting utilized     Problem: Breathing Pattern Ineffective  Goal: Effective Breathing Pattern  Intervention: Promote Improved Breathing Pattern  Recent Flowsheet Documentation  Taken 03/14/2024 0400 by Andrey Clarice RAMAN, RN  Head of Bed [facility]) Positioning: HOB at 30-45 degrees  Taken 03/14/2024 0200 by Andrey Clarice RAMAN, RN  Head of Bed [facility]) Positioning: HOB at 30-45 degrees  Taken 03/14/2024 0000 by Andrey Clarice RAMAN, RN  Head of Bed [facility]) Positioning: HOB at 30-45 degrees  Taken 03/13/2024 2200 by Andrey Clarice RAMAN, RN  Head of Bed [facility]) Positioning: HOB at 30-45 degrees  Taken 03/13/2024 2000 by Andrey Clarice RAMAN, RN  Head of Bed [facility]) Positioning: HOB at 30-45 degrees     Problem: Fall Injury Risk  Goal: Absence of Fall and Fall-Related Injury  Intervention: Promote Injury-Free Environment  Recent Flowsheet Documentation  Taken 03/13/2024 2000 by Andrey Clarice RAMAN, RN  Safety Interventions:   environmental modification   fall reduction program maintained   enteral feeding  safety   aspiration precautions     Problem: Gas Exchange Impaired  Goal: Optimal Gas Exchange  Intervention: Optimize Oxygenation and Ventilation  Recent Flowsheet Documentation  Taken 03/14/2024 0400 by Andrey Clarice RAMAN, RN  Head of Bed [facility]) Positioning: HOB at 30-45 degrees  Taken 03/14/2024 0200 by Andrey Clarice RAMAN, RN  Head of Bed [facility]) Positioning: HOB at 30-45 degrees  Taken 03/14/2024 0000 by Andrey Clarice RAMAN, RN  Head of Bed [facility]) Positioning: HOB at 30-45 degrees  Taken 03/13/2024 2200 by Andrey Clarice RAMAN, RN  Head of Bed [facility]) Positioning: HOB at 30-45 degrees  Taken 03/13/2024 2000 by Andrey Clarice RAMAN, RN  Head of Bed [facility]) Positioning: HOB at 30-45 degrees     Problem: Non-Violent Restraints  Intervention: Utilize least restrictive measures  Recent Flowsheet Documentation  Taken 03/14/2024 0400 by Andrey Clarice RAMAN, RN  Less Restrictive Alternative:   Comfort Measures   Repositioning  Taken 03/14/2024 0200 by Andrey Clarice RAMAN, RN  Less Restrictive Alternative:   Repositioning   Comfort Measures  Taken 03/14/2024 0000 by Andrey Clarice RAMAN, RN  Less Restrictive Alternative:   Repositioning   Comfort Measures  Taken 03/13/2024 2200 by Andrey Clarice RAMAN, RN  Less Restrictive Alternative:   Repositioning   Comfort Measures  Taken 03/13/2024 2000 by Andrey Clarice RAMAN, RN  Less Restrictive Alternative:   Repositioning   Comfort Measures  Intervention: Patient Monitoring  Recent Flowsheet Documentation  Taken 03/14/2024 0400 by Andrey Clarice RAMAN, RN  Psychological Status/Visual Check: Subdued  Circulation/Skin Integrity: No signs of injury  Range of Motion: Performed  Fluids: NPO  Food/Meal: Enteral feeding/TPN  Elimination: Urinary catheter  Taken 03/14/2024 0200 by Andrey Clarice RAMAN, RN  Psychological Status/Visual Check: Subdued  Circulation/Skin Integrity: No signs of injury  Range of Motion: Performed  Fluids: NPO  Food/Meal: Enteral feeding/TPN  Elimination: Urinary catheter  Taken 03/14/2024 0000 by Andrey Clarice RAMAN, RN  Psychological Status/Visual Check: Subdued  Circulation/Skin Integrity: No signs of injury  Range of Motion: Performed  Fluids: NPO  Food/Meal: Enteral feeding/TPN  Elimination: Urinary catheter  Taken 03/13/2024 2200 by Andrey Clarice RAMAN, RN  Psychological Status/Visual Check: Subdued  Circulation/Skin Integrity: No signs of injury  Range of Motion: Performed  Fluids: NPO  Food/Meal: Enteral feeding/TPN  Elimination: Urinary catheter  Taken 03/13/2024 2000 by Andrey Clarice RAMAN, RN  Psychological Status/Visual Check: Subdued  Circulation/Skin Integrity: No signs of injury  Range of Motion: Performed  Fluids: NPO  Food/Meal: Enteral feeding/TPN  Elimination: Urinary catheter  Intervention: Patient Education  Recent Flowsheet Documentation  Taken 03/14/2024 0400 by Andrey Clarice RAMAN, RN  Criteria Explained: Yes  Patient's Response: No evidence of learning  Family Notification: Other  Taken 03/14/2024 0200 by Andrey Clarice RAMAN, RN  Criteria Explained: Yes  Patient's Response: No evidence of learning  Family Notification: Other  Taken 03/14/2024 0000 by Andrey Clarice RAMAN, RN  Criteria Explained: Yes  Patient's Response: No evidence of learning  Family Notification: Other  Taken 03/13/2024 2200 by Andrey Clarice RAMAN, RN  Criteria Explained: Yes  Patient's Response: No evidence of learning  Family Notification: Other  Taken 03/13/2024 2000 by Andrey Clarice RAMAN, RN  Criteria Explained: Yes  Patient's Response: No evidence of learning  Family Notification: Other     Problem: Wound  Goal: Skin Health and Integrity  Intervention: Optimize Skin Protection  Recent Flowsheet Documentation  Taken 03/14/2024 0400 by Andrey Clarice RAMAN, RN  Pressure Reduction Techniques: heels elevated off bed  Head of Bed [facility]) Positioning: HOB at 30-45 degrees  Pressure Reduction Devices: heel offloading device utilized  Taken 03/14/2024 0200 by Andrey Clarice RAMAN, RN  Pressure Reduction Techniques: heels elevated off bed  Head of Bed (HOB) Positioning: HOB at 30-45 degrees  Pressure Reduction Devices: heel offloading device utilized  Taken 03/14/2024 0000 by Andrey Clarice RAMAN, RN  Pressure Reduction Techniques: heels elevated off bed  Head of Bed (HOB) Positioning: HOB at 30-45 degrees  Pressure Reduction Devices: heel offloading device utilized  Taken 03/13/2024 2200 by Andrey Clarice RAMAN, RN  Pressure Reduction Techniques: heels elevated off bed  Head of Bed (HOB) Positioning: HOB at 30-45 degrees  Pressure Reduction Devices: heel offloading device utilized  Taken 03/13/2024 2000 by Andrey Clarice RAMAN, RN  Pressure Reduction Techniques: heels elevated off bed  Head of Bed (HOB) Positioning: HOB at 30-45 degrees  Pressure Reduction Devices: heel offloading device utilized  Skin Protection: adhesive use limited

## 2024-03-14 NOTE — Unmapped (Signed)
 Patient successfully extubated to room air per MD order, prior to extubation patient was suctioned above and below cuff, positive for cuff leak noted, post extubation no stridor, no respiratory distress, tolerating well at this time, will continue to monitor.     Problem: Mechanical Ventilation Invasive  Goal: Effective Communication  Outcome: Resolved  Goal: Optimal Device Function  Outcome: Resolved  Intervention: Optimize Device Care and Function  Recent Flowsheet Documentation  Taken 03/14/2024 9166 by Hermelinda Norleen RAMAN, RRT  Oral Care:   mouth swabbed   suction provided   tongue brushed  Goal: Mechanical Ventilation Liberation  Outcome: Resolved  Goal: Optimal Nutrition Delivery  Outcome: Resolved  Goal: Absence of Device-Related Skin and Tissue Injury  Outcome: Resolved  Goal: Absence of Ventilator-Induced Lung Injury  Outcome: Resolved  Intervention: Prevent Ventilator-Associated Pneumonia  Recent Flowsheet Documentation  Taken 03/14/2024 0833 by Hermelinda Norleen RAMAN, RRT  Head of Bed [facility]) Positioning: HOB at 30-45 degrees  Oral Care:   mouth swabbed   suction provided   tongue brushed

## 2024-03-14 NOTE — Unmapped (Signed)
 WOCN Consult Services                                                                 Wound Evaluation     Reason for Consult:   - High Risk Skin Assessment  - Incontinence Associated Dermatitis  - Initial  - Moisture Associated Skin Damage    Problem List:   Principal Problem:    Bacteremia, escherichia coli  Active Problems:    Alcoholic liver disease (HHS-HCC)    HTN (hypertension)    Depression with anxiety    Class 2 severe obesity with serious comorbidity in adult    Atrial fibrillation    (CMS-HCC)    Pleural effusion    Hypernatremia    Thrombocytopenia    Cirrhosis    (CMS-HCC)    A-fib (CMS-HCC)    Hepatic encephalopathy    (CMS-HCC)    Assessment:  [name] is a 78 y.o. female with HFpEF, HTN, Afib on AC, MAFLD Cirrhosis, presented for scheduled TEE/DCCV (aborted d/t LAA clot), admitted 10/9 for [facility], encephalopathy and septic shock.     WOCN in to see patient for reported incontinence dermatitis. Patient is have frequent stools with GI bleed. Fecal material is thin and liquid. Skin is fragile and patient is at high risk for breakdown. Staff continue to turn and reposition with Pressure prevention interventions. Zinc cream in use. Suggest switching to critic aid paste     Wound 03/13/24 Irritant Contact Dermatitis Incontinence Sacrum Mid gluteal cleft MASD? (Active)   Properties   Placement Date 03/13/24   Location Sacrum   Primary Wound Type Irritant Con   Secondary Wound Type - Irritant Contact Dermatitis Incontinence   Wound Location Orientation Mid   Wound Description (Comments) gluteal cleft MASD?      Assessments 03/14/2024  5:03 PM   Wound Image     Dressing Status      Changed   Wound Length (cm) 3 cm   Wound Width (cm) 2 cm   Wound Depth (cm) 0.1 cm   Wound Surface Area (cm^2) 4.71 cm^2   Wound Volume (cm^3) 0.314 cm^3   Margins Attached edges   Odor None   Site Assessment Fragile;Red   Treatments Cleansed/Irrigation Dressing Moisture barrier cream   Dressing Changed Changed         Continence Status:   Incontinence of bladder: Foley in place  Incontinent of bowel: incontinent    Moisture Associated Skin Damage:   - Incontinence-associated dermatitis (IAD)     Lab Results   Component Value Date    WBC 6.2 03/14/2024    HGB 10.5 (L) 03/14/2024    HCT 33.0 (L) 03/14/2024    ESR 13 02/29/2024    CRP <5.0 02/29/2024    A1C 4.8 02/29/2024    GLU 125 03/14/2024    POCGLU 369 (H) 03/11/2024    ALBUMIN 2.7 (L) 03/14/2024    PROT 5.3 (L) 03/14/2024       Support Surface:   - Low Air Loss - ICU    Offloading:  Left: Pillow  Right: Pillow    Type Debridement Completed By WOCN:  N/A    Teaching:  - Moisture management    WOCN Recommendations:   - See nursing  orders for wound care instructions.  - Contact WOCN with questions, concerns, or wound deterioration.  - Change to Crtic aid paste.    Topical Therapy/Interventions:   - Zinc oxide barrier cream     Recommended Consults:  - Not Applicable    WOCN Follow Up:  - We will sign off at this time    Plan of Care Discussed With:   - RN primary    Supplies Ordered: Yes- Critic aid paste  Gerlean # [id number]    Workup Time:   45 minutes    Niels Pitt RN BSN [facility]  WOCN Consult Team

## 2024-03-14 NOTE — Unmapped (Signed)
 Drowsy, following commands, extubated to 2L [place]. A&Ox3. Cortrak placed, feeds restarted. Multiple red/brown BM's this shift, team aware. Bath/CHG done. Turns continued.     Problem: Skin Injury Risk Increased  Goal: Skin Health and Integrity  Outcome: Shift Focus  Intervention: Optimize Skin Protection  Recent Flowsheet Documentation  Taken 03/14/2024 1400 by Brynn Needle, RN  Head of Bed [facility]) Positioning: HOB at 30-45 degrees  Taken 03/14/2024 1200 by Brynn Needle, RN  Head of Bed [facility]) Positioning: HOB at 30-45 degrees  Taken 03/14/2024 1000 by Brynn Needle, RN  Head of Bed [facility]) Positioning: HOB at 30-45 degrees  Taken 03/14/2024 0800 by Brynn Needle, RN  Pressure Reduction Techniques:   weight shift assistance provided   heels elevated off bed   pressure points protected  Head of Bed (HOB) Positioning: HOB at 30-45 degrees  Pressure Reduction Devices: pressure-redistributing mattress utilized  Skin Protection: adhesive use limited     Problem: Adult Inpatient Plan of Care  Goal: Absence of Hospital-Acquired Illness or Injury  Intervention: Identify and Manage Fall Risk  Recent Flowsheet Documentation  Taken 03/14/2024 0800 by Brynn Needle, RN  Safety Interventions:   bed alarm   environmental modification   fall reduction program maintained   lighting adjusted for tasks/safety   low bed  Intervention: Prevent Skin Injury  Recent Flowsheet Documentation  Taken 03/14/2024 1400 by Brynn Needle, RN  Positioning for Skin: Left  Taken 03/14/2024 1200 by Brynn Needle, RN  Positioning for Skin: Right  Taken 03/14/2024 1000 by Brynn Needle, RN  Positioning for Skin: Left  Taken 03/14/2024 0800 by Brynn Needle, RN  Positioning for Skin: Right  Skin Protection: adhesive use limited  Intervention: Prevent Infection  Recent Flowsheet Documentation  Taken 03/14/2024 0800 by Brynn Needle, RN  Infection Prevention:   cohorting utilized   hand hygiene promoted   rest/sleep promoted single patient room provided

## 2024-03-14 NOTE — Unmapped (Signed)
 Care Management  Initial Transition Planning Assessment    Patient lives with spouse in [place] [facility]) in a split level home with 5 steps to enter.   At baseline patient is independent with ADLs. Patient receives home health services from [place]. DME is rollator and RW. Family will provide transportation and will assist with basic care at home.                General  Care Manager / Social Worker assessed the patient by : Medical record review, Discussion with Clinical Care team  Orientation Level: Disoriented to situation  Functional level prior to admission: Independent  Reason for referral: Discharge Planning    Contact/Decision Maker  Extended Emergency Contact Information  Primary Emergency Contact: Macphail,[name]  Address: [address]           [place], [place] [address] [place]  Home Phone: [phone]  Mobile Phone: [phone]  Relation: Spouse  Secondary Emergency Contact: Sadlowski,[name]  Address: [address]           [place], [place] [address] [place]  Mobile Phone: [phone]  Relation: Daughter    Legal Next of Kin / Guardian / POA / Advance Directives     HCDM (patient stated preference): Bessent,[name] - Spouse - [phone]    HCDM, First Alternate: Bamford,[name] - Daughter - [phone]    Advance Directive (Medical Treatment)  Does patient have an advance directive covering medical treatment?: Patient has advance directive covering medical treatment, copy in chart.    Health Care Decision Maker [HCDM] (Medical & Mental Health Treatment)  Healthcare Decision Maker: HCDM documented in the HCDM/Contact Info section.  Information offered on HCDM, Medical & Mental Health advance directives:: Other (Comment) Marcelle Barrio & [name] Kerns Shared - Call [name] 1st)         Readmission Information    Have you been hospitalized in the last 30 days?: Yes  Name of Hospital: [facility]  Were you being cared for at a skilled nursing facility:: No     What day were you discharged from that hospital or facility?: 02/29/24  Number of Days between previous discharge and readmission date: 8-14 days      Patient Information  Lives with: Spouse/significant other    Type of Residence: Private residence        Location/Detail: [address]  [place] [address]    Support Systems/Concerns: Spouse, Children    Responsibilities/Dependents at home?: No    Home Care services in place prior to admission?: Yes  Type of Home Care services in place prior to admission: Home OT, Home PT  Current Home Care provider (Name/Phone #): [facility]            Equipment Currently Used at [employer]: walker, rolling, other (see comments) (rollator)       Currently receiving outpatient dialysis?: No       Financial Information       Need for financial assistance?: No       Social Drivers of Health  Social Drivers of Health     Food Insecurity: No Food Insecurity (03/02/2024)    Hunger Vital Sign     Worried About Running Out of Food in the Last Year: Never true     Ran Out of Food in the Last Year: Never true   Tobacco Use: Low Risk  (03/10/2024)    Patient History     Smoking Tobacco Use: Never  Smokeless Tobacco Use: Never     Passive Exposure: Past   Transportation Needs: No Transportation Needs (03/02/2024)    PRAPARE - Therapist, art (Medical): No     Lack of Transportation (Non-Medical): No   Alcohol Use: Not At Risk (04/20/2023)    Alcohol Use     How often do you have a drink containing alcohol?: Never     How many drinks containing alcohol do you have on a typical day when you are drinking?: 1 - 2     How often do you have 5 or more drinks on one occasion?: Never   Housing: Low Risk  (03/02/2024)    Housing     Within the past 12 months, have you ever stayed: outside, in a car, in a tent, in an overnight shelter, or temporarily in someone else's home (i.e. couch-surfing)?: No     Are you worried about losing your housing?: No   Physical Activity: Inactive (04/20/2023) Exercise Vital Sign     Days of Exercise per Week: 0 days     Minutes of Exercise per Session: 0 min   Utilities: Low Risk  (04/20/2023)    Utilities     Within the past 12 months, have you been unable to get utilities (heat, electricity) when it was really needed?: No   Stress: No Stress Concern Present (04/20/2023)    [employer] of Occupational Health - Occupational Stress Questionnaire     Feeling of Stress : Only a little   Interpersonal Safety: Patient Unable To Answer (03/11/2024)    Interpersonal Safety     Unsafe Where You Currently Live: Patient unable to answer     Physically Hurt by Anyone: Patient unable to answer     Abused by Anyone: Patient unable to answer   Substance Use: Low Risk  (04/20/2023)    Substance Use     In the past year, how often have you used prescription drugs for non-medical reasons?: Never     In the past year, how often have you used illegal drugs?: Never     In the past year, have you used any substance for non-medical reasons?: No   Intimate Partner Violence: Patient Unable To Answer (03/07/2024)    Humiliation, Afraid, Rape, and Kick questionnaire     Fear of Current or Ex-Partner: Patient unable to answer     Emotionally Abused: Patient unable to answer     Physically Abused: Patient unable to answer     Sexually Abused: Patient unable to answer   Social Connections: Moderately Isolated (04/20/2023)    Social Connection and Isolation Panel     Frequency of Communication with Friends and Family: More than three times a week     Frequency of Social Gatherings with Friends and Family: More than three times a week     Attends Religious Services: Never     Database administrator or Organizations: No     Attends Banker Meetings: Never     Marital Status: Married   Programmer, applications: Low Risk  (03/02/2024)    Overall Financial Resource Strain (CARDIA)     Difficulty of Paying Living Expenses: Not hard at all   Health Literacy: Low Risk  (04/20/2023) Health Literacy     : Never   Internet Connectivity: No Internet connectivity concern identified (04/20/2023)    Internet Connectivity     Do you have access to internet services: Yes  How do you connect to the internet: Personal Device at home     Is your internet connection strong enough for you to watch video on your device without major problems?: Yes     Do you have enough data to get through the month?: Yes     Does at least one of the devices have a camera that you can use for video chat?: Yes       Complex Discharge Information    Is patient identified as a difficult/complex discharge?: No        Discharge Needs Assessment  Concerns to be Addressed: discharge planning    Clinical Risk Factors: > 65, Readmission < 30 Days, New Diagnosis    Barriers to taking medications: No    Prior overnight hospital stay or ED visit in last 90 days: Yes         Patient's Choice of Community Agency(s): no preference stated    Anticipated Changes Related to Illness: other (see comments) (Likely will need time to recover to resume prior level of functioning.)    Equipment Needed After Discharge: other (see comments) (CM to follow for DME needs)    Discharge Facility/Level of Care Needs: other (see comments) (CM to follow for DC needs)    Readmission  Risk of Unplanned Readmission Score: UNPLANNED READMISSION SCORE: 22.22%  Predictive Model Details          22%  Factor Value    Calculated 03/14/2024 12:08 17% Number of active inpatient medication orders 28    [place] Risk of Unplanned Readmission Model 10% Charlson Comorbidity Index 10     9% Diagnosis of cancer present     8% ECG/EKG order present in last 6 months     7% Diagnosis of electrolyte disorder present     7% Restraint order present in last 6 months     6% Imaging order present in last 6 months     6% Age 78     5% Latest hemoglobin low (11.0 g/dL)     5% Phosphorous result present     5% Number of ED visits in last six months 1     4% Number of hospitalizations in last year 1     4% Active anticoagulant inpatient medication order present     4% Current length of stay 4.041 days     2% Future appointment scheduled     1% Active ulcer inpatient medication order present      Readmitted Within the Last 30 Days? (No if blank) Yes  Patient at risk for readmission?: Yes    Discharge Plan  Screen findings are: Discharge planning needs identified or anticipated (Comment). (CM to follow for DC needs)    Expected Discharge Date: 03/18/2024    Expected Transfer from Critical Care: 03/15/24    Quality data for continuing care services shared with patient and/or representative?: N/A  Patient and/or family were provided with choice of facilities / services that are available and appropriate to meet post hospital care needs?: Other (Comment) (CM will provide choice of facilities/services if deemed medically necessary.)       Initial Assessment complete?: Yes

## 2024-03-15 LAB — CBC W/ AUTO DIFF
BASOPHILS ABSOLUTE COUNT: 0.1 10*9/L (ref 0.0–0.1)
BASOPHILS RELATIVE PERCENT: 1.2 %
EOSINOPHILS ABSOLUTE COUNT: 0.3 10*9/L (ref 0.0–0.5)
EOSINOPHILS RELATIVE PERCENT: 5.2 %
HEMATOCRIT: 33.2 % — ABNORMAL LOW (ref 34.0–44.0)
HEMOGLOBIN: 10.8 g/dL — ABNORMAL LOW (ref 11.3–14.9)
LYMPHOCYTES ABSOLUTE COUNT: 1.1 10*9/L (ref 1.1–3.6)
LYMPHOCYTES RELATIVE PERCENT: 22.3 %
MEAN CORPUSCULAR HEMOGLOBIN CONC: 32.6 g/dL (ref 32.0–36.0)
MEAN CORPUSCULAR HEMOGLOBIN: 32.4 pg (ref 25.9–32.4)
MEAN CORPUSCULAR VOLUME: 99.5 fL — ABNORMAL HIGH (ref 77.6–95.7)
MEAN PLATELET VOLUME: 10.3 fL (ref 6.8–10.7)
MONOCYTES ABSOLUTE COUNT: 0.6 10*9/L (ref 0.3–0.8)
MONOCYTES RELATIVE PERCENT: 12.3 %
NEUTROPHILS ABSOLUTE COUNT: 2.9 10*9/L (ref 1.8–7.8)
NEUTROPHILS RELATIVE PERCENT: 59 %
PLATELET COUNT: 76 10*9/L — ABNORMAL LOW (ref 150–450)
RED BLOOD CELL COUNT: 3.33 10*12/L — ABNORMAL LOW (ref 3.95–5.13)
RED CELL DISTRIBUTION WIDTH: 16.4 % — ABNORMAL HIGH (ref 12.2–15.2)
WBC ADJUSTED: 5 10*9/L (ref 3.6–11.2)

## 2024-03-15 LAB — BLOOD GAS CRITICAL CARE PANEL, ARTERIAL
BASE EXCESS ARTERIAL: 7 — ABNORMAL HIGH (ref -2.0–2.0)
CALCIUM IONIZED ARTERIAL (MG/DL): 5.46 mg/dL — ABNORMAL HIGH (ref 4.40–5.40)
CARBOXYHEMOGLOBIN: 1.9 % — ABNORMAL HIGH (ref <1.2)
CHLORIDE, WHOLE BLOOD: 111 mmol/L — ABNORMAL HIGH (ref 98–107)
GLUCOSE WHOLE BLOOD: 125 mg/dL (ref 70–179)
HCO3 ARTERIAL: 32 mmol/L — ABNORMAL HIGH (ref 22–27)
HEMOGLOBIN BLOOD GAS: 10.2 g/dL — ABNORMAL LOW (ref 12.00–16.00)
LACTATE BLOOD ARTERIAL: 0.7 mmol/L (ref <1.3)
METHEMOGLOBIN: <1 % (ref <1.5)
O2 SATURATION ARTERIAL: 99.9 % (ref 94.0–100.0)
OXYHEMOGLOBIN: 97.5 % (ref 94.0–100.0)
PCO2 ARTERIAL: 57.4 mmHg — ABNORMAL HIGH (ref 35.0–45.0)
PH ARTERIAL: 7.37 (ref 7.35–7.45)
PO2 ARTERIAL: 154 mmHg — ABNORMAL HIGH (ref 80.0–110.0)
POTASSIUM WHOLE BLOOD: 3.7 mmol/L (ref 3.4–4.6)
SODIUM WHOLE BLOOD: 149 mmol/L — ABNORMAL HIGH (ref 135–145)

## 2024-03-15 LAB — COMPREHENSIVE METABOLIC PANEL
ALBUMIN: 2.5 g/dL — ABNORMAL LOW (ref 3.4–5.0)
ALKALINE PHOSPHATASE: 104 U/L (ref 46–116)
ALT (SGPT): 30 U/L (ref 10–49)
ANION GAP: 8 mmol/L (ref 5–14)
AST (SGOT): 26 U/L (ref <=34)
BILIRUBIN TOTAL: 1.1 mg/dL (ref 0.3–1.2)
BLOOD UREA NITROGEN: 11 mg/dL (ref 9–23)
BUN / CREAT RATIO: 23
CALCIUM: 9.7 mg/dL (ref 8.7–10.4)
CHLORIDE: 108 mmol/L — ABNORMAL HIGH (ref 98–107)
CO2: 33 mmol/L — ABNORMAL HIGH (ref 20.0–31.0)
CREATININE: 0.47 mg/dL — ABNORMAL LOW (ref 0.55–1.02)
EGFR CKD-EPI (2021) FEMALE: >90 mL/min/1.73m2 (ref >=60)
GLUCOSE RANDOM: 125 mg/dL (ref 70–179)
POTASSIUM: 3.8 mmol/L (ref 3.4–4.8)
PROTEIN TOTAL: 5.3 g/dL — ABNORMAL LOW (ref 5.7–8.2)
SODIUM: 149 mmol/L — ABNORMAL HIGH (ref 135–145)

## 2024-03-15 LAB — APTT
APTT: 27.8 s (ref 24.8–38.4)
HEPARIN CORRELATION: 0.2

## 2024-03-15 LAB — D-DIMER, QUANTITATIVE: D-DIMER QUANTITATIVE (ACL TOP): 828 ng{FEU}/mL — ABNORMAL HIGH (ref <=500)

## 2024-03-15 LAB — PLATELET COUNT: PLATELET COUNT: 59 10*9/L — ABNORMAL LOW (ref 150–450)

## 2024-03-15 LAB — FIBRINOGEN: FIBRINOGEN LEVEL: 217 mg/dL (ref 175–500)

## 2024-03-15 LAB — PROTIME-INR
INR: 1.22
PROTIME: 13.9 s — ABNORMAL HIGH (ref 9.9–12.6)

## 2024-03-15 LAB — MAGNESIUM: MAGNESIUM: 1.9 mg/dL (ref 1.6–2.6)

## 2024-03-15 LAB — PHOSPHORUS: PHOSPHORUS: 2 mg/dL — ABNORMAL LOW (ref 2.4–5.1)

## 2024-03-15 MED ADMIN — magnesium oxide (MAG-OX) tablet 400 mg: 400 mg | ORAL | @ 01:00:00

## 2024-03-15 MED ADMIN — ceFAZolin (ANCEF) IVPB 2 g in 50 ml dextrose (premix): 2 g | INTRAVENOUS | @ 01:00:00 | Stop: 2024-03-18

## 2024-03-15 MED ADMIN — potassium chloride (KLOR-CON) packet 20 mEq: 20 meq | GASTROENTERAL | @ 07:00:00 | Stop: 2024-03-15

## 2024-03-15 MED ADMIN — folic acid (FOLVITE) tablet 1 mg: 1 mg | GASTROENTERAL | @ 12:00:00

## 2024-03-15 MED ADMIN — lactulose oral solution: 20 g | GASTROENTERAL | @ 12:00:00

## 2024-03-15 MED ADMIN — ceFAZolin (ANCEF) IVPB 2 g in 50 ml dextrose (premix): 2 g | INTRAVENOUS | @ 10:00:00 | Stop: 2024-03-18

## 2024-03-15 MED ADMIN — pantoprazole (Protonix) oral suspension: 40 mg | GASTROENTERAL | @ 12:00:00

## 2024-03-15 MED ADMIN — cyanocobalamin (vitamin B-12) tablet 1,000 mcg: 1000 ug | GASTROENTERAL | @ 12:00:00

## 2024-03-15 MED ADMIN — ceFAZolin (ANCEF) IVPB 2 g in 50 ml dextrose (premix): 2 g | INTRAVENOUS | @ 18:00:00 | Stop: 2024-03-18

## 2024-03-15 MED ADMIN — multivitamins, therapeutic with minerals tablet 1 tablet: 1 | GASTROENTERAL | @ 12:00:00

## 2024-03-15 MED ADMIN — magnesium oxide (MAG-OX) tablet 400 mg: 400 mg | ORAL | @ 12:00:00

## 2024-03-15 MED ADMIN — anastrozole (ARIMIDEX) tablet 1 mg: 1 mg | GASTROENTERAL | @ 14:00:00

## 2024-03-15 MED ADMIN — lactulose oral solution: 20 g | GASTROENTERAL | @ 18:00:00

## 2024-03-15 MED ADMIN — thiamine mononitrate (vit B1) tablet 100 mg: 100 mg | GASTROENTERAL | @ 12:00:00

## 2024-03-15 NOTE — Unmapped (Signed)
 OCCUPATIONAL THERAPY  Evaluation (03/15/24 1419)    Patient Name:  [name]       Medical Record Number: [id number]     Date of Birth: 06-11-1945  Sex: Female      Post-Discharge Occupational Therapy Recommendations: 5x weekly, High intensity          Equipment Recommendation  OT DME Recommendations: Defer to post acute       OT Treatment Diagnosis: Generalized muscle weakness, Limitation of activities due to disability, Need for assistance with personal care, Reduced mobility, Unsteadiness on feet         Assessment  Assessment: 78yo F w HFpEF, HTN, Afib on AC, MAFLD Cirrhosis, presented for scheduled TEE/DCCV (aborted d/t LAA clot), admitted 10/9 for [facility], encephalopathy and septic shock.    [name] was seen for OT eval this PM, cleared for session by RN, agreeable. She was greeted semi reclined in bed and in NAD. She is presenting below baseline regarding activity tolerance, endurance, strength, balance, and cognition impacting safe participation in ADLs and mobility. She endorsed independence with ADLs and mobility prior to admission and required increased assistance today for mobility + self care. Will continue to follow in acute care setting with discharge plan of 5xH being appropriate.     Problem List: Decreased cognition, Impaired judgement, Decreased strength, Decreased coordination, Decreased activity tolerance, Impaired balance, Decreased skin integrity, Decreased endurance, Bowel dysfunction, Decreased mobility, Gait deviation, Fall risk, Impaired ADLs  Personal Factors/Comorbidities (Occupational Profile and History Review): Expanded (Moderate)  Assessment of Occupational Performance : Balance, Cognitive skills, Endurance, Mobility, Strength, Fine or gross motor coordination  Clinical Decision Making: Moderate Complexity       Today's Interventions: OT eval, OT POC, role of OT, discharge planning, activity tolerance, endurance, occupational profile, close VS monitoring, lines management, upright tolerance, bed mobility, t/f training, side steps at bedside, command following, LBD, UBD, grooming, toileting     Activity Tolerance During Today's Session  Tolerated treatment well    Plan  Planned Frequency of Treatment: Plan of Care Initiated: 03/15/24  1-2x per day Weekly Frequency: 3-4 days per week  Planned Treatment Duration: 03/29/24    Planned Interventions:  Clinical research associate, Education (Patient/Family/Caregiver), Self-Care/Home Training, Therapeutic Exercise, Therapeutic Activity, Neuromuscular Re-education      GOALS:   Patient and Family Goals: to go home    Short Term:   SHORT GOAL #1: Pt will perform toileting + t/f with mod I + LRAD   Time Frame : 2 weeks  SHORT GOAL #2: Pt will perform standing grooming task at sink with mod I + LRAD   Time Frame : 2 weeks  SHORT GOAL #3: Pt will perform full body dressing with mod I + LRAD/compensatory strategies   Time Frame : 2 weeks           Long Term Goal #1: Pt will score 22+/24 on AMPAC  Time Frame: 4 weeks    Prognosis:  Good  Positive Indicators:  PLOF  Barriers to Discharge: Inaccessible home environment, Impaired Balance, Decreased safety awareness, Cognitive deficits, Endurance deficits, Functional strength deficits, Inability to safely perform ADLS    Subjective  Medical Updates Since Last Visit/Relevant PMH Affecting Clinical Decision Making:    Prior Functional Status PTA, pt indep with ADLs. Denied use of DME at baseline (per EMR uses RW/rollator). Lives with daughter, granddaughter, and husband. (-) drives.    Living Situation  Living Environment: House  Lives With: Spouse, Daughter, Family (granddaughter)  Home Living:  Multi-level home, Walk-in shower, Raised toilet seat without rails, Stairs to enter with rails, Stairs to alternate level with rails, Built-in shower seat  Rail placement (outside): Bilateral rails in reach  Number of Stairs to Enter (outside): 5  Number of Stairs to Alternate level (inside):  (need to clarify)  Caregiver Identified?: Yes  Caregiver Availability: 24 hours (daughter and granddaughter)     Equipment available at home: Rollator, Optician, dispensing Tests / Procedures: reviewed in Epic       Patient / Caregiver reports: I need to use the bathroom      Past Medical History[1] Social History     Tobacco Use    Smoking status: Never     Passive exposure: Past    Smokeless tobacco: Never   Substance Use Topics    Alcohol use: Not Currently      Past Surgical History[2] Family History[3]     Sulfa (sulfonamide antibiotics), Sulfur , and Aspirin     Objective Findings  Precautions / Restrictions  Falls precautions, Aspiration precautions       Weight Bearing  Non-applicable    Required Braces or Orthoses  Non-applicable    Communication Preference  Verbal       Pain  Denied pain    Equipment / Environment  Vascular access (PIV, TLC, Port-a-cath, PICC), Telemetry, NGT, Arterial line, Foley         Cognition   Orientation Level:  Disoriented to time ((-) date and year)   Arousal/Alertness:  Delayed responses to stimuli   Attention Span:  Attends with cues to redirect   Memory:  Decreased recall of biographical information   Following Commands:  Follows one-step commands, Requires increased time, Requires repetition   Safety Judgment:  Decreased awareness of need for safety   Awareness of Errors and Problem Solving:  Assistance required to identify errors made, Assistance required to generate solutions, Assistance required to implement solutions   Comments:      Vision / Hearing   Vision: No acute deficits identified     Hearing: No deficit identified         Hand Function:  Right Hand Function: Right hand grip strength, ROM and coordination WNL  Left Hand Function: Left hand grip strength, ROM and coordination WNL    Skin Inspection:  Skin Inspection: Redness, Bruising, Skin tear    Face/Cervical ROM:  Face ROM: WFL  Cervical ROM: WFL    ROM / Strength:  UE ROM/Strength: Left Impaired/Limited, Right Impaired/Limited  RUE Impairment: Reduced strength, Limited AROM  LUE Impairment: Reduced strength, Limited AROM  LE ROM/Strength: Left Impaired/Limited, Right Impaired/Limited  RLE Impairment: Reduced strength  LLE Impairment: Reduced strength    Coordination:  Coordination: Decreased speed    Sensation:  RUE Sensation: RUE intact  LUE Sensation: LUE intact  RLE Sensation: RLE intact  LLE Sensation: LLE intact    Balance:  Static Sitting-Level of Assistance: Stand by Camera operator of Assistance: Stand by Surveyor, mining of Assistance: Minimum assistance  Dynamic Standing - Level of Assistance: Minimum assistance  Standing Balance comments: + HHA    Functional Mobility  Transfers: Min assist (min A STS from EOB with R HHA x3)  Bed Mobility - Needs Assistance: Standby assist, Max assist, +2 assist (SBA supine > sit EOB + elevated HOB; sit EOB > supine with max Ax2)  Ambulation: side steps to L with R HHA and min A for balance, 1x slight LOB  that pt was able to self correct    ADLs  Feeding : Set Up Assist (anticipated)  Grooming: Set Up Assist, Performed seated (washing face and using mouthwash following one step commands seated at EOB)  Bathing: Mod assist (anticipated)  Toileting: Max assist, Performed standing, Performed at bed level (posterior peri hygiene with max A standing at EOB and sidelying in bed)  UB Dressing: Max assist, Performed at bed level (donning gown semi reclined in bed with verbal cues for BUE placement)  LB Dressing: Max assist, Performed at bed level (donning socks in bed)  IADLs: NT    Vitals / Orthostatics  Vitals/Orthostatics: 95% on RA, 133/73 (95), 91bpm; VSS via tele throughotu session    Patient at end of session: All needs in reach, Lines intact, Notified Nurse, In bed     Occupational Therapy Session Duration  OT Individual [mins]: 39       AM-PAC-Daily Activity  Lower Body Dressing assistance needs: A lot - Maximum/Moderate Assistance  Bathing assistance needs: A lot - Maximum/Moderate Assistance  Toileting assistance needs: A lot - Maximum/Moderate Assistance  Upper Body Dressing assistance needs: A lot - Maximum/Moderate Assistance  Personal Grooming assistance needs: A Little - Minimal/Contact Guard Assist/Supervision  Eating Meals assistance needs: A Little - Minimal/Contact Guard Assist/Supervision    Daily Activity Score: 14    Score (in points): % of Functional Impairment, Limitation, Restriction  6: 100% impaired, limited, restricted  7-8: At least 80%, but less than 100% impaired, limited restricted  9-13: At least 60%, but less than 80% impaired, limited restricted  14-19: At least 40%, but less than 60% impaired, limited restricted  20-22: At least 20%, but less than 40% impaired, limited restricted  23: At least 1%, but less than 20% impaired, limited restricted  24: 0% impaired, limited restricted      I attest that I have reviewed the above information.  Signed: Johaan Ryser M Dyann Goodspeed, OT  Filed 03/15/2024                 [1]   Past Medical History:  Diagnosis Date    A-fib (CMS-HCC)     Alcoholism    (CMS-HCC)     Alcoholism /alcohol abuse     Cirrhosis    (CMS-HCC)     Depression     Hypertension     Liver disease     Malignant neoplasm of overlapping sites of right breast in female, estrogen receptor positive    (CMS-HCC) 10/03/2020   [2]   Past Surgical History:  Procedure Laterality Date    BREAST BIOPSY Right     benign-a long time ago    BREAST BIOPSY Right 07/2020    malignant    BREAST LUMPECTOMY Right     4 2022    CHOLECYSTECTOMY      PR BX/REMV,LYMPH NODE,DEEP AXILL Right 09/03/2020    Procedure: BX/EXC LYMPH NODE; OPEN, DEEP AXILRY NODE;  Surgeon: Alm Elsie Como, MD;  [facility];  Service: Surgical Oncology Breast    PR CARDIOVERSION, ELECTIVE;EXTERN N/A 03/10/2024    Procedure: CARDIOVERSION, ELECTIVE, ELECTRICAL CONVERSION OF ARRHYTHMIA; EXTERNAL;  Surgeon: Sedalia Velma Hamilton, MD;  [facility]; Service: Cardiology    PR ECHO HEART,TRANSESOPHAGEAL,COMPLETE Midline 03/10/2024    Procedure: ECHOCARDIOGRAPHY, TRANSESOPHAGEAL, REAL-TIME WITH IMAGE DOCUMENTATION;  Surgeon: Sedalia Velma Hamilton, MD;  [facility];  Service: Cardiology    PR INTRAOPERATIVE SENTINEL LYMPH NODE ID W DYE INJECTION Right 09/03/2020  Procedure: INTRAOPERATIVE IDENTIFICATION SENTINEL LYMPH NODE(S) INCLUDE INJECTION NON-RADIOACTIVE DYE, WHEN PERFORMED;  Surgeon: Alm Elsie Como, MD;  Location: [facility] OR [facility];  Service: Surgical Oncology Breast    PR MASTECTOMY, PARTIAL Right 09/03/2020    Procedure: MASTECTOMY, PARTIAL (EG, LUMPECTOMY, TYLECTOMY, QUADRANTECTOMY, SEGMENTECTOMY);  Surgeon: Alm Elsie Como, MD;  [facility];  Service: Surgical Oncology Breast    PR UPPER GI ENDOSCOPY,BIOPSY N/A 02/03/2018    Procedure: UGI ENDOSCOPY; WITH BIOPSY, SINGLE OR MULTIPLE;  Surgeon: Eleanor Dewey Sorrel, MD;  [facility]: [facility];  Service: Gastroenterology    RADIATION Right     unsure finish date    TUBAL LIGATION     [3]   Family History  Problem Relation Age of Onset    Heart disease Mother     No Known Problems Father     No Known Problems Sister     No Known Problems Daughter     No Known Problems Maternal Grandmother     No Known Problems Maternal Grandfather     No Known Problems Paternal Grandmother     No Known Problems Paternal Grandfather     Lupus Brother     No Known Problems Other     BRCA 1/2 Neg Hx     Breast cancer Neg Hx     Cancer Neg Hx     Colon cancer Neg Hx     Endometrial cancer Neg Hx     Ovarian cancer Neg Hx     Mental illness Neg Hx     Substance Abuse Disorder Neg Hx

## 2024-03-15 NOTE — Unmapped (Signed)
 PHYSICAL THERAPY  Evaluation (03/15/24 0849)      Patient Name:  [name]       Medical Record Number: [id number]   Date of Birth: 1946-02-17  Sex: Female        Post-Discharge Physical Therapy Recommendations:  PT Post Acute Discharge Recommendations: 5x weekly, High intensity   Equipment Recommendation  PT DME Recommendations: Defer to post acute          Treatment Diagnosis: Abnormalities of gait and mobility, Unsteadiness on feet        ASSESSMENT  Problem List: Decreased mobility, Impaired balance, Urinary incontinence, Decreased endurance, Gait deviation, Fall risk, Decreased strength      KATTIA SELLEY is a 78 y.o. female with HFpEF, HTN, Afib on AC, MAFLD Cirrhosis, presented for scheduled TEE/DCCV (aborted d/t LAA clot), admitted 10/9 for [facility], encephalopathy and septic shock.    Upon evaluation, [name] presents with impairments in balance, endurance, safety awareness, and gait stability, all impacting her ability to safely transfer and ambulate at her baseline level of independence. She will benefit from skilled acute PT to progress towards PLOF while inpatient, in addition to 5xH post-acute PT recommendation to facilitate safe transition home.      Today's Interventions: Balance activities, Endurance activities, Gait training, Patient/Family/Caregiver Education, Positioning, Therapeutic activity  Today's Interventions: PT eval, AMPAC, pt education re: PT role, POC, activity pacing/progression, call don't fall, OOB to chair     Personal Factors/Comorbidities Present: 3+ factors   Examination of Body systems: 3+ elements  Clinical Presentation: Evolving    Eval Complexity : Moderate Complexity     Activity Tolerance: Tolerated treatment well       PLAN  Planned Frequency of Treatment: Plan of Care Initiated: 03/15/24  1-2x per day Weekly Frequency: 4-5 days per week  Planned Treatment Duration: 03/29/24     Planned Interventions: Education (Patient/Family/Caregiver), Gait training, Home exercise program, Neuromuscular re-education, Self-care / Home Management training, Therapeutic Exercise, Therapeutic Activity     Goals:   Patient and Family Goals: none stated     SHORT GOAL #1: Pt will complete all bed mobility mod I               Time Frame : 2 weeks  SHORT GOAL #2: Pt will perform all transfers with LRAD, mod IND              Time Frame : 2 weeks  SHORT GOAL #3: Pt will ambulate 150 ft with LRAD, mod IND              Time Frame : 2 weeks  SHORT GOAL #4: Pt will negotiate x9 steps with B rails, CGA               Time Frame : 2 weeks    Long Term Goal #1: Pt will tolerate 20 minutes of continuous standing activity  Time Frame: 3 weeks     Prognosis:  Good  Positive Indicators: PLOF, CLOF, participation  Barriers to Discharge: Inaccessible home environment, Impaired Balance, Decreased safety awareness     SUBJECTIVE  Communication Preference: Verbal     Patient reports: agreeable to PT  Pain Comments: Pt denies pain throughout session        Prior Functional Status: Per PT eval 10/3: IND at home for mobility and self care, lives with spouse, daughter, and granddaughter. Daughter and granddaughter can assist as needed. Mostly sedentary, does not leave the house much. Denies recent falls  Living Situation  Living Environment: House  Lives With: Spouse, Daughter, Family  Home Living: Multi-level home, Walk-in shower, Raised toilet seat without rails, Stairs to enter with rails, Stairs to alternate level with rails, Built-in shower seat  Rail placement (outside): Bilateral rails in reach  Number of Stairs to Enter (outside): 9  Number of Stairs to Alternate level (inside):  (need clarification on stairs)      Equipment available at home: Rollator, Rolling walker        Past Medical History[1]         Social History     Tobacco Use    Smoking status: Never     Passive exposure: Past    Smokeless tobacco: Never   Substance Use Topics    Alcohol use: Not Currently       Past Surgical History[2]          Family History[3]     Allergies: Sulfa (sulfonamide antibiotics), Sulfur , and Aspirin      Objective Findings  Precautions / Restrictions  Precautions: Falls precautions, NPO  Weight Bearing Status: Non-applicable  Required Braces or Orthoses: Non-applicable     Medical Tests / Procedures: chart reviewed in EPIC  Equipment / Environment: Vascular access (PIV, TLC, Port-a-cath, PICC), Telemetry, Supplemental oxygen, NGT, Arterial line, Foley (3L [place])     Vitals/Orthostatics : SBP 150s-170s throughout session, pt denies dizziness. SpO2 stable on 3LNC. NAD     Cognition: Follows 1-step commands  Cognition comment: pt soft spoken, increased time to answer questions  Orientation: Disoriented to situation, Disoriented to place  Visual/Perception: Wears Glasses/Contacts all the time  Hearing: No deficit identified     Skin Inspection: Intact where visualized     Upper Extremities  UE ROM: Right WFL, Left WFL  UE Strength: Right WFL, Left WFL    Lower Extremities  LE ROM: Right WFL, Left WFL  LE Strength: Right Impaired/Limited, Left Impaired/Limited  RLE Strength Impairment: Reduced strength  LLE Strength Impairment: Reduced strength  LE comment: assessed functionally    Face, Cervical and Trunk ROM  Face ROM: WFL  Cervical ROM: WFL  Trunk ROM: WFL     Coordination: WFL  Proprioception: WFL  Sensation: WFL  Posture: Rounded shoulders, Forward head    Static Sitting-Level of Assistance: Standby Camera operator of Assistance: Administrator, sports of Assistance: Contact guard  Dynamic Standing - Level of Assistance: Contact guard  Standing Balance comments: w RW      Bed Mobility: Supine to Sit  Supine to Sit assistance level: Contact guard assist, steadying assist  Bed Mobility comments: HOB partially elevated     Transfers: Bed to Chair  Sit to Stand assistance level: Minimal assist, patient does 75% or more  Bed to Chair assistance level: Minimal assist, patient does 75% or more (no AD)  Transfer comments: STS from EOB x1 with Min A, B HHA. STS from recliner x4 during session with Min A + RW, cues for hand placement prior to sitting / standing, with fair carryover.      Gait Level of Assistance: Contact guard assist, steadying assist  Gait Assistive Device: Rolling walker  Gait Distance Ambulated (ft): 80 ft  Skilled Treatment Performed: Pt ambulated 60 ft, 80 ft with RW + CGA. Pt with forward flexed posture, decreased gait speed, lateral path deviation requiring cues to look ahead / stay in middle of hallway. Seated rest break taken for safety, chair follow for safety. Max cueing needed  for turn sequencing.     Stairs: Not assessed this date.            Endurance: fair    Patient at end of session: All needs in reach, Alarm activated, In chair, Lines intact, Notified Nurse    Physical Therapy Session Duration  PT Individual [mins]: 53          AM-PAC-6 click  Help currently need turning over In bed?: A Little - Minimal/Contact Guard Assist/Supervision  Help currently needed sitting down/standing up from chair with arms? : A Little - Minimal/Contact Guard Assist/Supervision  Help currently needed moving from supine to sitting on edge of bed?: A Little - Minimal/Contact Guard Assist/Supervision  Help currently needed moving to and from bed from wheelchair?: A Little - Minimal/Contact Guard Assist/Supervision  Help currently needed walking in a hospital room?: A Little - Minimal/Contact Guard Assist/Supervision  Help currently needed climbing 3-5 steps with railing?: A Little - Minimal/Contact Guard Assist/Supervision    Basic Mobility Score 6 click: 18    6 click Score (in points): % of Functional Impairment, Limitation, Restriction  6: 100% impaired, limited, restricted  7-8: At least 80%, but less than 100% impaired, limited restricted  9-13: At least 60%, but less than 80% impaired, limited restricted  14-19: At least 40%, but less than 60% impaired, limited restricted  20-22: At least 20%, but less than 40% impaired, limited restricted  23: At least 1%, but less than 20% impaired, limited restricted  24: 0% impaired, limited restricted    'AM-PAC' forms are Copyright protected by The [employer] of [facility]     I attest that I have reviewed the above information.  Signed: Powell JAYSON Simpson, PT  Filed 03/15/2024     The care for this patient was completed by Powell JAYSON Simpson, PT:  Seleta Lighter, a rehab aide was present and participated in the care. Licensed/Credentialed therapist was physically present and immediately available to direct and supervise tasks that were related to patient management. The direction and supervision was continuous throughout the time these tasks were performed.    Powell JAYSON Simpson, PT           [1]   Past Medical History:  Diagnosis Date    A-fib (CMS-HCC)     Alcoholism    (CMS-HCC)     Alcoholism /alcohol abuse     Cirrhosis    (CMS-HCC)     Depression     Hypertension     Liver disease     Malignant neoplasm of overlapping sites of right breast in female, estrogen receptor positive    (CMS-HCC) 10/03/2020   [2]   Past Surgical History:  Procedure Laterality Date    BREAST BIOPSY Right     benign-a long time ago    BREAST BIOPSY Right 07/2020    malignant    BREAST LUMPECTOMY Right     4 2022    CHOLECYSTECTOMY      PR BX/REMV,LYMPH NODE,DEEP AXILL Right 09/03/2020    Procedure: BX/EXC LYMPH NODE; OPEN, DEEP AXILRY NODE;  Surgeon: Alm Elsie Como, MD;  [facility];  Service: Surgical Oncology Breast    PR CARDIOVERSION, ELECTIVE;EXTERN N/A 03/10/2024    Procedure: CARDIOVERSION, ELECTIVE, ELECTRICAL CONVERSION OF ARRHYTHMIA; EXTERNAL;  Surgeon: Sedalia Velma Hamilton, MD;  [facility];  Service: Cardiology    PR ECHO HEART,TRANSESOPHAGEAL,COMPLETE Midline 03/10/2024    Procedure: ECHOCARDIOGRAPHY, TRANSESOPHAGEAL, REAL-TIME WITH IMAGE DOCUMENTATION;  Surgeon: Sedalia Velma Hamilton,  MD;  Location: [facility]  Service: Cardiology    PR INTRAOPERATIVE SENTINEL LYMPH NODE ID W DYE INJECTION Right 09/03/2020    Procedure: INTRAOPERATIVE IDENTIFICATION SENTINEL LYMPH NODE(S) INCLUDE INJECTION NON-RADIOACTIVE DYE, WHEN PERFORMED;  Surgeon: Alm Elsie Como, MD;  Location: [facility] OR [facility];  Service: Surgical Oncology Breast    PR MASTECTOMY, PARTIAL Right 09/03/2020    Procedure: MASTECTOMY, PARTIAL (EG, LUMPECTOMY, TYLECTOMY, QUADRANTECTOMY, SEGMENTECTOMY);  Surgeon: Alm Elsie Como, MD;  Location: [facility] OR [facility];  Service: Surgical Oncology Breast    PR UPPER GI ENDOSCOPY,BIOPSY N/A 02/03/2018    Procedure: UGI ENDOSCOPY; WITH BIOPSY, SINGLE OR MULTIPLE;  Surgeon: Eleanor Dewey Sorrel, MD;  [facility]: [facility];  Service: Gastroenterology    RADIATION Right     unsure finish date    TUBAL LIGATION     [3]   Family History  Problem Relation Age of Onset    Heart disease Mother     No Known Problems Father     No Known Problems Sister     No Known Problems Daughter     No Known Problems Maternal Grandmother     No Known Problems Maternal Grandfather     No Known Problems Paternal Grandmother     No Known Problems Paternal Grandfather     Lupus Brother     No Known Problems Other     BRCA 1/2 Neg Hx     Breast cancer Neg Hx     Cancer Neg Hx     Colon cancer Neg Hx     Endometrial cancer Neg Hx     Ovarian cancer Neg Hx     Mental illness Neg Hx     Substance Abuse Disorder Neg Hx

## 2024-03-15 NOTE — Unmapped (Signed)
 VENOUS ACCESS ULTRASOUND PROCEDURE NOTE    Indications:   Poor venous access.    The Venous Access Team has assessed this patient for the placement of a PIV. Ultrasound guidance was necessary to obtain access.     Procedure Details:  Identity of the patient was confirmed via name, medical record number and date of birth. The availability of the correct equipment was verified.    The vein was identified for ultrasound catheter insertion.  Field was prepared with necessary supplies and equipment.  Probe cover and sterile gel utilized.  Insertion site was prepped with chlorhexidine solution and allowed to dry.  The catheter extension was primed with normal saline. A(n) 22 gauge 1.75 catheter was placed in the L Forearm with 1attempt(s). See LDA for additional details.    Catheter aspirated, 2 mL blood return present. The catheter was then flushed with 10 mL of normal saline. Insertion site cleansed, and dressing applied per manufacturer guidelines. The catheter was inserted with difficulty due to swelling and ecchymosis in left arm by Rea LOISE Pop, RN.    Primary RN was notified.     Thank you,     Rea LOISE Pop, RN Venous Access Team   [phone]     Workup / Procedure Time:  30 minutes    See images below:      Please follow [facility] guidelines for long PIV, deep vein medication contraindications for infusates.    [email or web address]=%[email or web address]%2FClinical Guidelines%2FIV Administration of Non-Antineoplastic Medications via Midline Catheters%2Epdf&parent=%2Fsites%2FMCPharmacy%2FClinical Guidelines

## 2024-03-15 NOTE — Unmapped (Incomplete)
 [facility] MICU to Geriatrics (MEDA) Transfer Note    Assessment & Plan:   [name] is a 78 y.o. 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 Found to have acute hypercarbic respiratory failure, acute hepatic encephalopathy, and hypothermia following procedure, prompting intubation. Transferred to MICU for further management. She has since been extubated to nasal cannulated and weaned to room air***. Able to ambulate with PT. Patient down graded to step down status and transferred to [facility] for further management.    Principal Problem:    Bacteremia, escherichia coli  Active Problems:    Alcoholic liver disease (HHS-HCC)    HTN (hypertension)    Depression with anxiety    Class 2 severe obesity with serious comorbidity in adult    Atrial fibrillation    (CMS-HCC)    Pleural effusion    Hypernatremia    Thrombocytopenia    Cirrhosis    (CMS-HCC)    A-fib (CMS-HCC)    Hepatic encephalopathy    (CMS-HCC)      Acute Toxic Metabolic Encephalopathy  Concern for Hepatic Encephalopathy  S/p Procedural Sedation  Patient presented for scheduled TEE/DCCV with Cardiology on 10/10, received 50 mg propofol  and found to be difficult to wake following sedation with hypercarbic respiratory failure. Per chart review, initially poorly responsive to stimulation and started on NIPPV with BiPAP. Hypercarbia improved but remained altered eventually requiring intubation as below. Ddx for ongoing encephalopathy includes toxic metabolites 2/2 hypercarbia and electrolyte abnormalities, HE (not on lactulose ), infection/sepsis, ongoing sedation 2/2 pre-procedural propofol .  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 Transferred to CCU w/ pulm consult and placed on NIPPV. CXR with slight pulmonary edema, diuresed 1x w/ IV Lasix 40 mg. Hypercarbia corrected on BiPAP then AVAPS from 86 > 58 but ultimately required intubation due to acute hypoxic respiratory failure from hypoventilation likely due to encephalopathy. Extubated to  on 10/13 and now stable on room air.   - BiPAP overnight    Sepsis 2/2 E. Coli Bacteremia  Afebrile upon arrival to scheduled procedure via axillary temp, but found to be hypothermic to 34.1C via foley catheter upon admission to OSH. Normotensive, non-tachycardic, without leukocytosis at the time. Hypothermia improved with Bair Hugger. Eventually sedated and intubated as above for acute hypoxic respiratory failure and encephalopathy. No fluid pocket for diagnostic paracentesis on POCUS. CXR with interstitial edema and bilateral pulmonary effusions but no focal consolidation. UA noninfectious. Blood cultures x 2 found to be positive for E. Coli. Lactate increased from 2 to 6.2 on recheck. MAPs have remained > 65, not on pressors. Started on Zosyn at OSH. Ddx for etiology GI translocation vs SBP.  - 10/13 downgraded zosyn to cefazolin , will complete 7d course  - has GNR in blood     Hypotension 2/2 medication and sepsis  Likely 2/2 sedation +/- infection. Received 2L IVF and alubmin. Required levophed briefly but able to wean off since 10/12. BP has remained stable.  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 No hematemesis. HDS, not on pressors, Hgb stable. S/p PPI, ceftriaxone, and octreotide for GIB PPX. Per GI consult at OSH, low suspicion for variceal bleeding given HD stability and stable hemoglobin. Suspect related to colonic mass. Has had some recurrent hematochezia since 10/13, however hemoglobin remains stable and GI recommends outpatient management.  - PO daily PPI  - On Ancef , resume CTX ppx after  - Monitor for further hematochezia  - Trend CBC  - 1u cryo 10/10     Chronic HFpEF - HTN  Discharged 03/04/24 after several weeks worsening BLE edema, orthopnea and dyspnea, w/ Pro-BNP 850 and HFpEF on TTE. Dry weight 195 lbs. Established care w/ Cardiology since discharge, currently on diuretic PRN, MRA, coreg  at home. On admission, pro-BNP 520, slightly elevated from 490 at previous discharge but improved from 846 on 02/29/24. CXR with interstitial edema and bilateral pulmonary effusions. Diuresed x 1 with IV lasix 40 mg with ~1.5L UOP on 10/9. Given 2L LR at OSH for sepsis on 10/10. Has remained stable and euvolemic***. GDMT held due to c/f acute infection but can consider restarting in coming days pending clinical improvement.  - HOLD home GDMT in setting of acute infection:              - SGLT2i: Farxiga 47$/month - consider as outpatient              - BB: Coreg  3.125 mg BID              - MRA: Aldactone 25 mg daily              - Diuretics: Torsemide PRN  - Daily Weights  - Strict I/Os     Atrial Fibrillation - Left Atrial Appendage Thrombus (03/10/24)  New onset (03/2024), continued on home Coreg  3.125 mg BID. Started on Eliquis (cost-prohibitive due to not meeting deductible). Attempted TEE DCCV however aborted due to LAA thrombus. Unable to anticoagulate due to c/f GIB. Attempted to restart SQH ppx on 10/12-10/13, however patient had recurrent hematochezia and some light red secretions after extubation so all anticoagulation was held.  - HOLD Eliquis 5 mg BID in setting of GIB  - HOLD Coreg  BID in setting of acute infection    AKI  Baseline Cr ~0.4. Creatinine elevated to 0.66 on admission.  - Strict I/Os  - Trend BMPs    MetALD Cirrhosis - Hx Hepatic Encephalopathy  History of decompensated metabolic-associated cirrhosis with HE, G1 EV and PHG on EGD 01/2018. Presented altered and encephalopathic following sedation as above. Ammonia level elevated 192 > 251 from previous level of 30. Liver doppler US  negative for acute thrombosis. Not on lactulose  prior to admission. Had been receiving lactulose  enemas at OSH. Concern for development of GI bleed on 10/10 AM as below, GI consulted at OSH with low suspicion for variceal bleed, recommend placing NG tube for lactulose  administration to prevent further bleed.  - Hepatology consult on arrival to [facility]  - Lactulose  via NG tube, goal 4-5x BM/day  - Continue diuretics as above  - Needs to establish care with outpatient GI clinic  - Daily MELD labs  - Hepatology consult for cirrhosis mgmt +/- biopsy coordination    ***Please do not delete the section below. Fill out as best you can.   Mentation CAM: Overall CAM-ICU: Negative {CAM:101138}  6CIT:    {3RPU:884761}  Delirium Order Set: {delirium order dzu:895413}  Dementia: {YES/NO:21013}  Behavioral Symptoms (baseline or now): {YES/NO:21013}  If any behavioral symptoms, agitation or delirium consider using to the medabehavioral dot phrase in a significant event note.   Mobility Baseline functional status: {Functional Status:103941}  Current functional status: {Functional Status:103941}  PT/OT Ordered: {PT/OT:103569}   Medications Reviewed home medications, screening for potentially inappropriate medications: {Yes/No:22953}  Medications recommended de-prescribing: ***   What Matters Geri Assessment complete: {RHJ:896429}  ***If geriatric assessment has been done then please copy and past the geriatric assessment summary here     Chronic Problems:  ***      Issues Impacting Complexity of Management:  {Issues Impacting Complexity of Management (Optional):96701}      {Medical Decision Making (Optional):97088}    Daily Checklist:  Diet: {JLH DIET:49409::Regular Diet}  DVT PPx: {JLHDVTPPXNEW:65522}  Electrolytes: {JLH OBUZD:50568}  Code Status: Full Code  Dispo: {JLH Dispo:49408}    Team Contact Information:   Primary Team: {JLH IM Services Wzt:39028}  Primary Resident: Tinnie DELENA Carbine, MD, MD  Resident's Pager: {IMSERVICEPAGERS:73620}    Interval History:   {JLH APSO - Subjective:49428}    {GOYMND:23400}    Objective:   {JLH APSO - Objective:49430}    Gen: WDWN *** in NAD, answers questions appropriately  Eyes: sclera anicteric, EOMI  HENT: atraumatic, MMM, OP w/o erythema or exudate   Heart: RRR, S1, S2, no M/R/G, no chest wall tenderness  Lungs: CTAB, no crackles or wheezes, no use of accessory muscles  Abdomen: Normoactive bowel sounds, soft, NTND, no rebound/guarding  Extremities: no clubbing, cyanosis, or edema in the BLEs  Psych: Alert, oriented, appropriate mood and affect    Labs/Studies: Labs and Studies from the last 24hrs per EMR and Reviewed

## 2024-03-15 NOTE — Unmapped (Addendum)
 [facility] MICU to Geriatrics (MEDA) Transfer Note    Assessment & Plan:   [name] is a 78 y.o. 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 Found to have acute hypercarbic respiratory failure, acute hepatic encephalopathy, and hypothermia following procedure, prompting intubation. Transferred to MICU for further management. She has since been extubated to nasal cannulated and weaned to room air. Able to ambulate with PT. Patient down graded to step down status and transferred to [facility] for further management.    Principal Problem:    Bacteremia, escherichia coli  Active Problems:    Alcoholic liver disease (HHS-HCC)    HTN (hypertension)    Depression with anxiety    Class 2 severe obesity with serious comorbidity in adult    Atrial fibrillation    (CMS-HCC)    Pleural effusion    Hypernatremia    Thrombocytopenia    Cirrhosis    (CMS-HCC)    A-fib (CMS-HCC)    Hepatic encephalopathy    (CMS-HCC)    Toxic Metabolic Encephalopathy, improving  Patient presented for scheduled TEE/DCCV with Cardiology on 10/10, received 50 mg propofol  and found to be difficult to wake following sedation with hypercarbic respiratory failure. Per chart review, initially poorly responsive to stimulation and started on NIPPV with BiPAP. Hypercarbia improved but remained altered eventually requiring intubation as below. Suspected likely multifactorial in the setting of hypercarbia, electrolyte abnormalities, HE, infection.    - CTH 10/10 wo AIP  - Successfully extubated 10/13.   - Delirium precautions  - Given no asterixis and mental status improving s/p extubation, GI rec DC lactulose  iso c/f HE    S/p Procedural Sedation c/b Acute on Chronic Hypoxic and Hypercarbic Respiratory Failure I  I Likely OSA    Presented with somnolence and difficulty to rouse s/p TEE/DCCV sedation as above. Transferred to CCU w/ pulm consult and placed on NIPPV. CXR with slight pulmonary edema s/p diuresis. Hypercarbia corrected on BiPAP then AVAPS from 86 > 58 but ultimately required intubation due to acute hypoxic respiratory failure from hypoventilation likely due to encephalopathy. Extubated to [place] on 10/13 and now stable on room air.   - BiPAP overnight  - VBG q12h    Sepsis 2/2 E. Coli Bacteremia  Afebrile upon arrival to scheduled procedure via axillary temp, but found to be hypothermic to 34.1C via foley catheter. Otherwise, HDS without leukocytosis. Hypothermia improved with Bair Hugger. Eventually sedated and intubated as above for acute hypoxic respiratory failure and encephalopathy. No fluid pocket for diagnostic paracentesis on POCUS. CXR with interstitial edema and bilateral pulmonary effusions but no focal consolidation. UA noninfectious. Blood cultures 10/10 found to be positive for E. Coli. Required levophed briefly but able to wean off since 10/12.  Initially started on Zosyn, now narrowed to Cefazolin  as below.  Unclear etiology though possible colonic source iso mass-like thickening involving the lower sigmoid colon.   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 No hematemesis. S/p  PPI, ceftriaxone, and octreotide for GIB PPX. Low suspicion for variceal bleeding given HD stability and stable hemoglobin. Suspect related to colonic mass. Has had some recurrent hematochezia since 10/13, however hemoglobin remains stable and GI recommends outpatient management.  - PO daily PPI  - On Ancef , resume CTX ppx after  - Daily CBC  - Colonoscopy outpatient for incidental finding concerning for sigmoid colon malignancy     Chronic HFpEF - HTN  On admission, pro-BNP 520, slightly elevated from 490 at previous discharge but improved from 846 on 02/29/24. CXR with interstitial edema and bilateral pulmonary effusions. GDMT held due to c/f acute infection but can consider restarting in coming days pending clinical improvement.  - HOLD home GDMT in setting of acute infection:  - BB: Coreg  3.125 mg BID  - ARB/ACEi/ARNI: hold iso hypotension/sepsis  - MRA: Aldactone 25 mg daily  - SGLT2i: Farxiga 47$/month - consider as outpatient  - HOLD Diuretics  - Daily Weights, EDW 195 lbs  - Strict I/Os  - Goal K >4, Mg >2  - Daily BMP, Mg     Atrial Fibrillation - Left Atrial Appendage Thrombus (03/10/24)  New onset (03/2024), continued on home Coreg  3.125 mg BID. Started on Eliquis (cost-prohibitive due to not meeting deductible). Attempted TEE DCCV however aborted due to LAA thrombus. Unable to anticoagulate due to c/f GIB. Attempted to restart SQH ppx on 10/12-10/13, however patient had recurrent hematochezia and some light red secretions after extubation so all anticoagulation was held.  - HOLD Eliquis 5 mg BID in setting of GIB  - HOLD BB as above    MASLD Cirrhosis - Hx Hepatic Encephalopathy I Thrombocytopenia  History of decompensated metabolic-associated cirrhosis with HE, G1 EV and PHG on EGD 01/2018. Previously followed with hepatology but lost to follow up in 2020. Formerly prescribed lactulose  but has not been on it in several years. Presented altered and encephalopathic following sedation as above. Ammonia level elevated 192 -> 251 -> 82 from previous level of 30. Liver doppler US  negative for acute thrombosis. Had been receiving lactulose  enemas at OSH. Concern for development of GI bleed on 10/10 AM, GI consulted with low suspicion for variceal bleed, initially recommend placing NG tube for lactulose  administration to prevent further bleed. Most recently recommend stopping lactulose  given absence of asterixis and to avoid worsening electrolyte abnormalities and bowel urgency.   - Hepatology consulted, appreciate recommendations  - Discontinue Lactulose  via NG tube, goal 4-5x BM/day  - HOLD diuretics as above iso electrolyte abnormalities  - Daily MELD labs  - Follow up with GI outpatient    MELD 3.0: 11 at 03/15/2024  7:54 AM  MELD-Na: 9 at 03/15/2024  7:54 AM  Calculated from:  Serum Creatinine: 0.47 mg/dL (Using min of 1 mg/dL) at 89/85/7974  2:45 AM  Serum Sodium: 149 mmol/L (Using max of 137 mmol/L) at 03/15/2024  7:54 AM  Total Bilirubin: 1.1 mg/dL at 89/85/7974  2:45 AM  Serum Albumin: 2.5 g/dL at 89/85/7974  2:45 AM  INR(ratio): 1.22 at 03/15/2024  7:54 AM  Age at listing (hypothetical): 78 years  Sex: Female at 03/15/2024  7:54 AM     Hypernatremia  Suspected iso poor po intake. S/p post pyloric corpak placement. Previously discontinued D5 and adjusting FWF.    - Na level BID          Mentation CAM: Overall CAM-ICU: Negative CAM: Positive  6CIT:    Not yet completed (please touch base with nursing)  Delirium Order  Set: Ordered (appropriate for any patient >65)  Dementia: No.  Behavioral Symptoms (baseline or now): No.     Mobility Baseline functional status: Independent in ADLs  Current functional status: Needs Assistance with ADLs  PT/OT Ordered: Recommend AIR   Medications Reviewed home medications, screening for potentially inappropriate medications: Yes  Medications recommended de-prescribing: n/a   What Matters Geri Assessment complete: No, needs to be done       Chronic Problems    Vertebral Compression Fracture  T12 vertebral compression fracture with retropulsion into the ventral spinal canal on CT. Has known history of osteoporosis identified on Dexa scan of femoral neck (11/2022 T score -2.9).      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)  Continued anastrozole  1 mg daily    Issues Impacting Complexity of Management:  -Intensive monitoring of drug toxicity from Zofran  with EKG to monitor QTc and Cefazolin  with BMP  -High risk of complications from pain and/or analgesia likely to result in delirium  -The patient is at high risk from Hospital immobility in an elderly patient given baseline poor functional status with a high risk of causing delirium and further decline in function  -The patient is at high risk for the development of complications of volume overload due to the need to provide IV hydration for suspected hypovolemia in the setting of: heart failure and Cirrhosis  -Need for the following intensive monitoring parameter(s) due to high risk of clinical decline: frequent monitoring of urine output to assess for the efficacy of diuretic regimen and scheduled monitoring of NGT output to monitor for return of bowel function  -The patient is at high risk of complications from delirium      Medical Decision Making: Reviewed records from the following unique sources  Care Everywhere, OSH notes.    Daily Checklist:  Diet: Regular Diet  DVT PPx: Heparin 5000units q8h, held due to high risk bleed  Electrolytes: Replete Potassium to >/=4 and Magnesium  to >/=2  Code Status: Full Code  Dispo: Pending Transfer to [facility]    Team Contact Information:   Primary Team: Geriatrics (MEDA)  Primary Resident: Raynell Wendi Coe, MD, MD  Resident's Pager: [phone] (Geriatrics Senior Resident)

## 2024-03-15 NOTE — Unmapped (Signed)
 Pt A&Ox3. Follow commands. Sats >90% on 2L. HR IN nsr. Maps >65. Afebrile. No c/o pain. UOP dimininished~300 mL. BM x1. Standard precautions maintained. Q2 turns maintained. See MAR/Flowsheets for more info.            Problem: Skin Injury Risk Increased  Goal: Skin Health and Integrity  Intervention: Optimize Skin Protection  Recent Flowsheet Documentation  Taken 03/15/2024 0400 by Andrey Clarice RAMAN, RN  Pressure Reduction Techniques: heels elevated off bed  Head of Bed (HOB) Positioning: HOB at 30-45 degrees  Pressure Reduction Devices: heel offloading device utilized  Taken 03/15/2024 0200 by Andrey Clarice RAMAN, RN  Pressure Reduction Techniques: heels elevated off bed  Pressure Reduction Devices: heel offloading device utilized  Taken 03/15/2024 0000 by Andrey Clarice RAMAN, RN  Pressure Reduction Techniques: heels elevated off bed  Head of Bed (HOB) Positioning: HOB at 30-45 degrees  Pressure Reduction Devices: heel offloading device utilized  Taken 03/14/2024 2200 by Andrey Clarice RAMAN, RN  Pressure Reduction Techniques: heels elevated off bed  Head of Bed (HOB) Positioning: HOB at 30-45 degrees  Pressure Reduction Devices: heel offloading device utilized  Taken 03/14/2024 2000 by Andrey Clarice RAMAN, RN  Pressure Reduction Techniques: heels elevated off bed  Head of Bed (HOB) Positioning: HOB at 30-45 degrees  Pressure Reduction Devices: heel offloading device utilized     Problem: Adult Inpatient Plan of Care  Goal: Absence of Hospital-Acquired Illness or Injury  Intervention: Identify and Manage Fall Risk  Recent Flowsheet Documentation  Taken 03/14/2024 2000 by Andrey Clarice RAMAN, RN  Safety Interventions:   bleeding precautions   bed alarm   environmental modification   enteral feeding safety   fall reduction program maintained   low bed  Intervention: Prevent Skin Injury  Recent Flowsheet Documentation  Taken 03/15/2024 0400 by Andrey Clarice RAMAN, RN  Positioning for Skin: Right  Taken 03/15/2024 0200 by Andrey Clarice RAMAN, RN  Positioning for Skin: Left  Taken 03/15/2024 0000 by Andrey Clarice RAMAN, RN  Positioning for Skin: Right  Taken 03/14/2024 2200 by Andrey Clarice RAMAN, RN  Positioning for Skin: Left  Taken 03/14/2024 2000 by Andrey Clarice RAMAN, RN  Positioning for Skin: Right  Intervention: Prevent Infection  Recent Flowsheet Documentation  Taken 03/14/2024 2000 by Andrey Clarice RAMAN, RN  Infection Prevention:   cohorting utilized   environmental surveillance performed   equipment surfaces disinfected     Problem: Breathing Pattern Ineffective  Goal: Effective Breathing Pattern  Intervention: Promote Improved Breathing Pattern  Recent Flowsheet Documentation  Taken 03/15/2024 0400 by Andrey Clarice RAMAN, RN  Head of Bed [facility]) Positioning: HOB at 30-45 degrees  Taken 03/15/2024 0000 by Andrey Clarice RAMAN, RN  Head of Bed [facility]) Positioning: HOB at 30-45 degrees  Taken 03/14/2024 2200 by Andrey Clarice RAMAN, RN  Head of Bed [facility]) Positioning: HOB at 30-45 degrees  Taken 03/14/2024 2000 by Andrey Clarice RAMAN, RN  Head of Bed [facility]) Positioning: HOB at 30-45 degrees     Problem: Fall Injury Risk  Goal: Absence of Fall and Fall-Related Injury  Intervention: Promote Injury-Free Environment  Recent Flowsheet Documentation  Taken 03/14/2024 2000 by Andrey Clarice RAMAN, RN  Safety Interventions:   bleeding precautions   bed alarm   environmental modification   enteral feeding safety   fall reduction program maintained   low bed     Problem: Gas Exchange Impaired  Goal: Optimal Gas Exchange  Intervention: Optimize Oxygenation and Ventilation  Recent Flowsheet Documentation  Taken 03/15/2024 0400  by Andrey Clarice RAMAN, RN  Head of Bed [facility]) Positioning: HOB at 30-45 degrees  Taken 03/15/2024 0000 by Andrey Clarice RAMAN, RN  Head of Bed [facility]) Positioning: HOB at 30-45 degrees  Taken 03/14/2024 2200 by Andrey Clarice RAMAN, RN  Head of Bed [facility]) Positioning: HOB at 30-45 degrees  Taken 03/14/2024 2000 by Andrey Clarice RAMAN, RN  Head of Bed [facility]) Positioning: HOB at 30-45 degrees     Problem: Wound  Goal: Absence of Infection Signs and Symptoms  Intervention: Prevent or Manage Infection  Recent Flowsheet Documentation  Taken 03/14/2024 2000 by Andrey Clarice RAMAN, RN  Infection Management: aseptic technique maintained  Goal: Skin Health and Integrity  Intervention: Optimize Skin Protection  Recent Flowsheet Documentation  Taken 03/15/2024 0400 by Andrey Clarice RAMAN, RN  Pressure Reduction Techniques: heels elevated off bed  Head of Bed (HOB) Positioning: HOB at 30-45 degrees  Pressure Reduction Devices: heel offloading device utilized  Taken 03/15/2024 0200 by Andrey Clarice RAMAN, RN  Pressure Reduction Techniques: heels elevated off bed  Pressure Reduction Devices: heel offloading device utilized  Taken 03/15/2024 0000 by Andrey Clarice RAMAN, RN  Pressure Reduction Techniques: heels elevated off bed  Head of Bed (HOB) Positioning: HOB at 30-45 degrees  Pressure Reduction Devices: heel offloading device utilized  Taken 03/14/2024 2200 by Andrey Clarice RAMAN, RN  Pressure Reduction Techniques: heels elevated off bed  Head of Bed (HOB) Positioning: HOB at 30-45 degrees  Pressure Reduction Devices: heel offloading device utilized  Taken 03/14/2024 2000 by Andrey Clarice RAMAN, RN  Pressure Reduction Techniques: heels elevated off bed  Head of Bed (HOB) Positioning: HOB at 30-45 degrees  Pressure Reduction Devices: heel offloading device utilized     Problem: Mechanical Ventilation Invasive  Goal: Absence of Ventilator-Induced Lung Injury  Intervention: Prevent Ventilator-Associated Pneumonia  Recent Flowsheet Documentation  Taken 03/15/2024 0400 by Andrey Clarice RAMAN, RN  Head of Bed [facility]) Positioning: HOB at 30-45 degrees  Taken 03/15/2024 0000 by Andrey Clarice RAMAN, RN  Head of Bed [facility]) Positioning: HOB at 30-45 degrees  Taken 03/14/2024 2200 by Andrey Clarice RAMAN, RN  Head of Bed [facility]) Positioning: HOB at 30-45 degrees  Taken 03/14/2024 2000 by Andrey Clarice RAMAN, RN  Head of Bed [facility]) Positioning: HOB at 30-45 degrees

## 2024-03-15 NOTE — Unmapped (Signed)
 Hepatology Consult Service   Progress Note         Assessment and Recommendations:   [name] is a 78 y.o. 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 Found to have acute hypercarbic respiratory failure, acute hepatic encephalopathy, and hypothermia following procedure, prompting intubation. The patient is seen in consultation at the request of Elsie Ole Constant, MD (Medical ICU (MDI)) for decompensated cirrhosis with hepatic encephalopathy and colonic mass.    Patient presented for scheduled TEE/DCCV with Cardiology on 10/10, received 50 mg propofol  and found to be difficult to wake following sedation with hypercarbic respiratory failure. Per chart review, initially poorly responsive to stimulation and started on NIPPV with BiPAP, hypercarbia improved but remained altered eventually requiring intubation.  Successfully extubated 10/13.    MASLD cirrhosis, possibly previous HE  Previously followed by Levan Baron (last seen 2020). No active clinical signs of decompensation (ascites, variceal hemorrhage, HE).    Encephalopathy: likely multifactorial in the setting of hypercarbia, electrolyte abnormalities, infection; difficult to know how much was driven by hepatic encephalopathy as patient was intubated/sedated on admission; now that she is extubated, she has no asterixis and mental status is improving  - Recommend stopping lactulose  20mg  TID given absence of asterixis.  Also want to avoid worsening electrolyte abnormalities and bowel urgency.  - Please administer IVF to correct free water  deficit    Volume: home regimen - lasix 40mg , spiro 25mg   - Holding in the setting of electrolyte abnormalities    Infection: E coli bacteremia (?perhaps colonic source given mass)  - Continue IV cefazolin  (10/12 - )   - S/p IV Zosyn (10/10- 10/12)  - 2/2 Bcx 10/10 positive for pan-sensitive E coli  - No ascites on POCUS    Bleeding: Last EGD 01/2018 significant for grade I esophageal varices and portal hypertensive gastropathy  - Due for outpatient screening    MELD 3.0: 11 at 03/15/2024  7:54 AM  MELD-Na: 9 at 03/15/2024  7:54 AM  Calculated from:  Serum Creatinine: 0.47 mg/dL (Using min of 1 mg/dL) at 89/85/7974  2:45 AM  Serum Sodium: 149 mmol/L (Using max of 137 mmol/L) at 03/15/2024  7:54 AM  Total Bilirubin: 1.1 mg/dL at 89/85/7974  2:45 AM  Serum Albumin: 2.5 g/dL at 89/85/7974  2:45 AM  INR(ratio): 1.22 at 03/15/2024  7:54 AM  Age at listing (hypothetical): 78 years  Sex: Female at 03/15/2024  7:54 AM    Intermittent hematochezia  Sigmoid mass  CT AP demonstrates mass-like thickening involving the lower sigmoid colon, concerning for possible malignancy and potentially source of GNR bloodstream infection. Last colonoscopy in 1998, and patient has declined surveillance since then.  Since admission, patient has demonstrated intermittent hematochezia, likely in the setting of sigmoid mass.  Hemoglobin has remained stable and she has not required blood transfusions.  Patient would likely benefit from colonoscopy vs flexible sigmoidoscopy to evaluate sigmoid lesion, however, would first discuss goals of care with patient and family.  If colon cancer were found, she would likely not be a surgical candidate.  Of note, risk/benefit of sedation for procedure would need to be considered given known LA thrombus, currently not on anticoagulation.    Issues Impacting Complexity of Management:  -The patient has the  need for intensive monitoring parameter(s) due to high-risk of clinical decline: frequent monitoring of MELD score to monitor for progression to/progression of acute or acute on chronic liver failure    Recommendations discussed with the patient's primary team. We will continue to follow along with you.    Subjective:   Patient successfully extubated yesterday.  Walking the halls today.  Mental status is much improved and she has no asterixis on exam.  Hemoglobin stable without need for blood transfusions.    -I have reviewed the patient's prior records from [facility], [facility] as summarized in the HPI    Objective:   Pulse:  [72-88] 81  SpO2 Pulse:  [71-88] 79  Resp:  [14-23] 18  SpO2:  [100 %] 100 %    Gen: Chronically ill-appearing female in NAD, following commands, answering questions  Eyes: Sclera anicteric  Abdomen: Normoactive bowel sounds, soft, NTND, no rebound/guarding, no hepatosplenomegaly  Extremities: pitting edema  Neuro: no focal deficits, no asterixis    Pertinent Labs & Studies:  -I have reviewed the patient's labs from 03/15/24 which show stable Hgb, stable renal function (SCr, electrolytes), and stable LFTs    Prior GI work-up:  02/03/2018 EGD (cirrhosis): Grade 1 varices seen 2 columns.  PHG in the entire stomach, retained fluid in the stomach.  Normal duodenum    CT AP 03/11/24:  Impression   --Left basilar pulmonary opacity which is favored to predominantly reflect atelectasis. There is a moderate right and trace left pleural effusion.      --There is masslike thickening involving the lower sigmoid colon concerning for colonic malignancy. Further evaluation with nonemergent colonoscopy is recommended.      -There is cirrhotic morphology with multiple varices in the left hemiabdomen as well as a splenorenal shunt.

## 2024-03-15 NOTE — Unmapped (Signed)
 Pt is A&Ox3-4- struggles with situation, VSS, Controlled a-fib, O2 sats >90% on RA. Afebrile. No c/o pain. UOP adequate (foley now removed), 1 BM this shift. TF running at goal via corepack. SLP evaluation today. Skin intact, Q2H turns and standard precautions maintained. No falls/injuries this shift. Pt sent to [facility] for acute status. All monitors with appropriate alarm settings, call bell within reach, see flowsheets/MAR for further info.        Problem: Self-Care Deficit  Goal: Improved Ability to Complete Activities of Daily Living  Outcome: Shift Focus     Problem: Breathing Pattern Ineffective  Goal: Effective Breathing Pattern  Outcome: Shift Focus  Intervention: Promote Improved Breathing Pattern  Recent Flowsheet Documentation  Taken 03/15/2024 0800 by Louis Connell NOVAK, RN  Head of Bed [facility]) Positioning: HOB at 30-45 degrees     Problem: Confusion Acute  Goal: Optimal Cognitive Function  Outcome: Shift Focus     Problem: Comorbidity Management  Goal: Maintenance of Heart Failure Symptom Control  Outcome: Shift Focus  Goal: Blood Pressure in Desired Range  Outcome: Shift Focus     Problem: Wound  Goal: Optimal Coping  Outcome: Shift Focus

## 2024-03-15 NOTE — Unmapped (Signed)
 MICU Progress Note     Date of Service: 03/15/2024    Problem List:   Principal Problem:    Bacteremia, escherichia coli  Active Problems:    Alcoholic liver disease (HHS-HCC)    HTN (hypertension)    Depression with anxiety    Class 2 severe obesity with serious comorbidity in adult    Atrial fibrillation    (CMS-HCC)    Pleural effusion    Hypernatremia    Thrombocytopenia    Cirrhosis    (CMS-HCC)    A-fib (CMS-HCC)    Hepatic encephalopathy    (CMS-HCC)    Transfer Summary: [name] is a 78 y.o. 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 Found to have acute hypercarbic respiratory failure, acute hepatic encephalopathy, and hypothermia following procedure, prompting intubation. Transferred to MICU for further management.     24hr events  - tolerating RA well, slightly hypercarbic overnight, will do BiPAP tonight  - walked the halls with PT  - will consider sending out to HB, but will touch base with GI and cardiology first to determine if intervention on LAA thrombus or hematochezia are expected, which would require her to stay at [facility] main  - stable for floor status    Neurological   Acute Toxic Metabolic Encephalopathy  Concern for Hepatic Encephalopathy  S/p Procedural Sedation  Patient presented for scheduled TEE/DCCV with Cardiology on 10/10, received 50 mg propofol  and found to be difficult to wake following sedation with hypercarbic respiratory failure. Per chart review, initially poorly responsive to stimulation and started on NIPPV with BiPAP, hypercarbia improved but remained altered eventually requiring intubation as below. Ddx for ongoing encephalopathy includes toxic metabolites 2/2 hypercarbia and electrolyte abnormalities, HE (not on lactulose ), infection/sepsis, ongoing sedation 2/2 pre-procedural propofol .  - Off prop since 10/11, extubated 10/13  - PRN oxy  - Management of HE as below  - Nonfocal but non-purposeful neuro exam    Analgesia: Pain adequately controlled  RASS at goal? Yes  [place] Agitation Assessment Scale (RASS) : -1 (03/15/2024 12:00 PM)       Pulmonary   Acute on Chronic Hypoxic Hypercarbic Respiratory Failure  S/p Procedural Sedation  Likely OSA  Presented with somnolence and difficulty to rouse s/p TEE/DCCV sedation as above. Found to have ABG w/ pH 7.28 / pCO2 86 / pO2 69. Transferred to CCU w/ pulm consult and placed on NIPPV. CXR with slight pulmonary edema, diuresed 1x w/ IV Lasix 40 mg. Hypercarbia corrected on BiPAP then AVAPS from 86 > 58 but ultimately required intubation due to acute hypoxic respiratory failure from hypoventilation likely due to encephalopathy.   - extubated 10/13 to [place]  - spot checking gasses to eval for hypercarbia with borderline CO2 and sleepiness  - BiPAP overnight    O2 Flow Rate (L/min):  [2 L/min] 2 L/min    Cardiovascular   Hypotension likely 2/2 sedation +/- infection  - s/p 2L fluid and albumin  - levophed off since 0200 10/12    Chronic HFpEF - HTN  Discharged 03/04/24 after several weeks worsening BLE edema, orthopnea and dyspnea, w/ Pro-BNP 850 and HFpEF on TTE. Dry weight 195 lbs. Established care w/ Cardiology since discharge, currently on diuretic PRN, MRA, coreg  at home. On admission, pro-BNP 520,  slightly elevated from 490 at previous discharge but improved from 846 on 02/29/24. CXR with interstitial edema and bilateral pulmonary effusions. Diuresed x 1 with IV lasix 40 mg with ~1.5L UOP on 10/9. Given 2L LR at OSH for sepsis on 10/10.  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 Started on Eliquis (cost-prohibitive due to not meeting deductible). Attempted TEE DCCV however aborted due to LAA thrombus.  - HOLD Eliquis 5 mg BID in setting of GIB  - HOLD Coreg  BID in setting of acute infection  - Anticoag plan as below    Renal   AKI  Baseline Cr ~0.4. Creatinine elevated to 0.66 on admission.  - Strict I/Os  - Trend BMPs    Infectious Disease/Autoimmune   Sepsis  Afebrile upon arrival to scheduled procedure via axillary temp, but found to be hypothermic to 34.1C via foley catheter upon admission to OSH. Normotensive, non-tachycardic, without leukocytosis at the time. Hypothermia improved with Bair Hugger. Eventually sedated and intubated as above for acute hypoxic respiratory failure and encephalopathy. No fluid pocket for diagnostic paracentesis on POCUS. CXR with interstitial edema and bilateral pulmonary effusions but no focal consolidation. UA noninfectious. Blood cultures x 2 found to be positive for E. Coli. Lactate increased from 2 to 6.2 on recheck. MAPs have remained > 65, not on pressors. Started on Zosyn at OSH. Ddx for etiology GI translocation vs SBP.  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 Presented altered and encephalopathic following sedation as above. Ammonia level elevated 192 > 251 from previous level of 30. Liver doppler US  negative for acute thrombosis. Not on lactulose  prior to admission. Had been receiving lactulose  enemas at OSH. Concern for development of GI bleed on 10/10 AM as below, GI consulted at OSH with low suspicion for variceal bleed, recommend placing NG tube for lactulose  administration to prevent further bleed.  - Hepatology consult on arrival to [facility]  - Lactulose  via NG tube, goal 4-5x BM/day  - Continue diuretics as above  - Needs to establish care with outpatient GI clinic  - Daily MELD labs  - hepatology consult for cirrhosis mgmt +/- biopsy coordination    Hematochezia - Known G1 EV on EGD 2019  Colonic Mass  Concern for Colon Cancer  On 10/10, patient noted have brown stool followed by maroon stool with bright pink blood tinging the pad below. No hematemesis. HDS, not on pressors, Hgb stable. S/p PPI, ceftriaxone, and octreotide for GIB PPX. Per GI consult at OSH, low suspicion for variceal bleeding given HD stability and stable hemoglobin. Suspect related to colonic mass.   - PO daily PPI  - On Ancef , resume CTX ppx after  - Monitor for further hematochezia  - Trend CBC  - 1u cryo  10/10    Provider Malnutrition Assessment:  Body mass index is 32.15 kg/m??.BMI Interpretation: >/= 30 and < 40, consistent with obesity, clinically significant requiring additional resources and complicating multiple aspects of patient care.  GLIM criteria:   Pt does not meet criteria  -I have screened this patient for malnutrition and they did NOT meet criteria for malnutrition based on GLIM criteria.  -Nutrition consulted no  RD assessment:Not done yet.           Heme/Coag   Atrial Fibrillation - Left Atrial Appendage Thrombus (03/10/24)  Hematochezia  - Held home eliquis for now ISO hematochezia  - Restarted SQH ppx on 10/12-10/13 to monitor for bleeding - continued hematochezia and some light red secretions after extubation. Held again.    Endocrine   - NAI    Prophylaxis/LDA/Restraints/Consults   ICU Checklist completed: yes (see ICU rounding navigator in Epic)      Patient Lines/Drains/Airways Status       Active Active Lines, Drains, & Airways       Name Placement date Placement time Site Days    CVC Triple Lumen 03/11/24 Non-tunneled Right Internal jugular 03/11/24  2200  Internal jugular  3    NG/OG Tube Feedings 10 Fr. Right nostril 03/14/24  1055  Right nostril  1    Urethral Catheter 03/10/24  1800  --  4    Arterial Line 03/11/24 Left Radial 03/11/24  1845  Radial  3                  Patient Lines/Drains/Airways Status       Active Wounds       Name Placement date Placement time Site Days    Wound 03/01/24 Other (Comment) Toe (Comment which one) Left;Other (Comment) Blister present on arrival, tx already in outpatient setting, in process of healing, surrounding erythema 03/01/24  0129  Toe (Comment which one)  14    Wound 03/13/24 Irritant Contact Dermatitis Incontinence Sacrum Mid gluteal cleft MASD? 03/13/24  --  Sacrum  2                    Goals of Care     Code Status:   Orders Placed This Encounter   Procedures    Full Code     Standing Status:   Standing     Number of Occurrences:   1        Designated Healthcare Decision Maker:  Ms. Egler designated healthcare decision maker(s) is/are   HCDM (patient stated preference): Kupper,[name] - Spouse - [phone]    HCDM, First AlternateAshayla, Subia - Daughter - [phone]. See HCDM section of Epic sidebar/storyboard or ACP tab in patient chart for details regarding active HCDMs and patient capacity for decision-making.      Subjective     Extubated, calm, walked the halls today    Objective     Vitals - past 24 hours  Pulse:  [72-92] 81  SpO2 Pulse:  [71-93] 79  Resp:  [14-25] 18  SpO2:  [100 %] 100 % Intake/Output  I/O last 3 completed shifts:  In: 2776.3 [NG/GT:2575; IV Piggyback:201.3]  Out: 1725 [Urine:1725]     Physical Exam:    General: chronically ill appearing, intubated  HEENT: dry mucous membranes  CV: normal rate, irreg rhythm, no m/r/g appreciated  Pulm: diminished bilaterally but clear, slightly increased WOB but protecting airway  GI: soft, distended, non-tender  MSK: BLE edema present  Skin: scattered bruising throughout, fragile  Neuro: no focal deficits, moving to pain, opens eyes spontaneously intermittently, MAE, non-focal, FC though sluggish      Continuous Infusions:   Infusions Meds[1]    Scheduled Medications:   Scheduled Medications[2]    PRN medications:  PRN Medications[3]    Data/Imaging Review: Reviewed in Epic and personally interpreted on 03/15/2024. See EMR for detailed results.      Geni DELENA Pan, PA                     [1] [2]    anastrozole   1 mg Enteral tube: gastric Daily    [Provider Hold] carvedilol   3.125 mg Oral BID    ceFAZolin   2 g Intravenous Q8H SCH    cyanocobalamin (vitamin B-12)  1,000 mcg Enteral tube: gastric Daily    flu vac 2025 [id number](PF)  0.5 mL Intramuscular During hospitalization    folic acid   1 mg Enteral tube: gastric Daily    [Provider Hold] heparin (porcine)  5,000 Units Subcutaneous Q8H [facility]    lactulose   20 g Enteral tube: gastric TID    magnesium  oxide  400 mg Oral BID    multivitamins (ADULT)  1 tablet Enteral tube: gastric Daily    pantoprazole  40 mg Enteral tube: gastric Daily    thiamine  mononitrate (vit B1)  100 mg Enteral tube: gastric Daily   [3] acetaminophen , docusate sodium, ondansetron , polyethylene glycol

## 2024-03-15 NOTE — Unmapped (Signed)
 Speech Language Pathology Clinical Swallow Assessment  Evaluation (03/15/24 1350)    Patient Name:  EITHEL RYALL       Medical Record Number: [id number]   Date of Birth: 1945/08/31  Sex: Female            SLP Treatment Diagnosis:  (rule out dysphagia)     Activity Tolerance: Patient tolerated treatment well    Assessment  Pt. presents with functional swallow across consistencies with no overt s/sx of aspiration. Oral mech exam unremarkable. Oral phase notable for adequate oral acceptance, manipulation, and transit of all consistencies and slow but adequate mastication of soft solids. Pt. normally wears dentures when eating, not currently present. Vocal quality mildly  hoarse but clear throughout exam. Strong cough noted. Speech is clear and intelligible. Given current clinical presentation, recommend Level 6/soft and bite-sized consistency solids and Level 0/thin liquids diet. Ok to advance to regular solids with presence/use of dentures. Meds with sips, as tolerated, or in puree PRN. Maintain aspiration precautions: upright positioning, slow rate, small bites/sips. No further ST services warranted at this time. Will sign off. Please reconsult w/ acute changes.  Risk for Aspiration: Mild     Recommendations:  PO Diet       Diet Liquids Recommendations: No Restrictions    Diet Solids Recommendation: Soft & Bite-sized, Level 6    Recommended Form of Medications: Whole, With liquid (or in puree PRN)      Post Acute Discharge Recommendations  Post Acute SLP Discharge Recommendations: Skilled SLP services are NOT indicated    Prognosis: Good  Positive Indicators: current clinical presentation, prior level of function  Barriers to Discharge: Inaccessible home environment, Impaired Balance, Decreased safety awareness     Plan of Care  SLP Daily Frequency: D/C Services, D/C Services     Treatment Goals:  Patient and Family Goal: Pt. has goal to have a ginger ale.    Subjective  Medical Updates Since Last Visit/Relevant PMH Affecting Clinical Decision Making: RAYLYNNE CUBBAGE is a 78 y.o. 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 Found to have acute hypercarbic respiratory failure, acute hepatic encephalopathy, and hypothermia following procedure, prompting intubation. Transferred to MICU for further management. Pt. currently NPO w/ corpak.      Lives With: Spouse, Daughter     Communication Preference: Verbal  Patient/Caregiver Reports: Pt. reports being on regular diet at baseline. Endorses using dentures consistently when eating.  Pain: no s/s of pain     Allergies: Sulfa (sulfonamide antibiotics), Sulfur , and Aspirin  Current Medications[1]  Past Medical History[2]  Family History[3]  Past Surgical History[4]  Social History     Tobacco Use    Smoking status: Never     Passive exposure: Past    Smokeless tobacco: Never   Substance Use Topics    Alcohol use: Not Currently         General:  Hearing Exceptions: None                 Self-Feeding Capacity: Functional for self-feeding                   Medical Tests / Procedures Comments: CXR 10/12: Interval removal of left internal jugular vein approach central venous catheter, otherwise stable chest.  Equipment/Environment:  Telemetry, Vascular access (PIV, TLC, Port-a-cath, PICC, NGT       Precautions / Restrictions  Precautions: Falls precautions, Aspiration precautions  Weight Bearing Status: Non-applicable  Required Braces or Orthoses: Non-applicable    Objective     Respiratory Status : Room air  History of Intubation: Yes    Behavior/Cognition: Alert, Cooperative, Confused  Positioning : Upright in bed    Oral / Motor Exam  Vocal Quality: Dysphonic (mildly hoarse)  Volitional Swallow: Within Functional Limits   Labial ROM: Within Functional Limits   Labial Symmetry: Within Functional Limits  Labial Strength: Within Functional Limits   Lingual ROM: Within Functional Limits  Lingual Symmetry: Within Functional Limits  Lingual Strength: Within Functional Limits   Lingual Sensation: Within Functional Limits  Velum: elevation grossly WNL   Mandible: Within Functional Limits  Coordination: WFL  Facial ROM: Within Functional Limits   Facial Symmetry: Within Functional Limits     Facial Sensation: Within Functional Limits   Vocal Intensity: Within Functional Limits       Apraxia: None present   Dysarthria: None present   Intelligibility: Intelligible   Breath Support: Adequate for speech   Dentition: Adequate    Consistencies assessed: thin liquids via straw sip (including serial sips), pureed solids via spoon, soft solids, mixed consistency trials (hard solids/thin liquids)    Patient at end of session: All needs in reach, In bed, Notified Nurse, Lines intact, Notified Provider    Speech Therapy Session Duration  SLP Individual [mins]: 18    I attest that I have reviewed the above information.  Signed: Greig KATHEE Christine, CCC-SLP    Filed 03/15/2024         [1]   Current Facility-Administered Medications   Medication Dose Route Frequency Provider Last Rate Last Admin    acetaminophen  (TYLENOL ) tablet 650 mg  650 mg Oral Q4H PRN Moreb, Kai Sadim, ACNP   650 mg at 03/13/24 1647    anastrozole  (ARIMIDEX ) tablet 1 mg  1 mg Enteral tube: gastric Daily Moreb, Kai Sadim, ACNP   1 mg at 03/15/24 1009    [Provider Hold] carvedilol  (COREG ) tablet 3.125 mg  3.125 mg Oral BID Visser, Jacquelyn Diane, PA        ceFAZolin  (ANCEF ) IVPB 2 g in 50 ml dextrose  (premix)  2 g Intravenous Q8H SCH Cammie Geni Rouse, [place] 100 mL/hr at 03/15/24 1334 2 g at 03/15/24 1334    cyanocobalamin (vitamin B-12) tablet 1,000 mcg  1,000 mcg Enteral tube: gastric Daily Moreb, Kai Sadim, ACNP   1,000 mcg at 03/15/24 9193    docusate sodium (COLACE) capsule 100 mg  100 mg Oral BID PRN Moreb, Kai Sadim, ACNP        flu vacc [phone] (65 yr up) (PF)(FLUAD)45 mcg(54mcgx3)/0.5 ml IM syringe  0.5 mL Intramuscular During hospitalization Leisa Elsie Smalls, MD        folic acid  (FOLVITE ) tablet 1 mg  1 mg Enteral tube: gastric Daily Moreb, Kai Sadim, ACNP   1 mg at 03/15/24 9193    [Provider Hold] heparin (porcine) 5,000 unit/mL injection 5,000 Units  5,000 Units Subcutaneous Q8H SCH Palmer, Geni Rouse, [place]        lactulose  oral solution  20 g Enteral tube: gastric TID Moreb, Kai Sadim, ACNP   20 g at 03/15/24 1334    magnesium  oxide (MAG-OX) tablet 400 mg  400 mg Oral BID Moreb, Kai Sadim, ACNP   400 mg at 03/15/24 0806    multivitamins, therapeutic with  minerals tablet 1 tablet  1 tablet Enteral tube: gastric Daily Moreb, Kai Sadim, ACNP   1 tablet at 03/15/24 0806    ondansetron  (ZOFRAN -ODT) disintegrating tablet 4 mg  4 mg Oral Q8H PRN Moreb, Kai Sadim, ACNP        pantoprazole (Protonix) oral suspension  40 mg Enteral tube: gastric Daily Cammie Geni Rouse, PA   40 mg at 03/15/24 0806    polyethylene glycol (MIRALAX) packet 17 g  17 g Oral Daily PRN Moreb, Kai Sadim, ACNP        thiamine  mononitrate (vit B1) tablet 100 mg  100 mg Enteral tube: gastric Daily Moreb, Kai Sadim, ACNP   100 mg at 03/15/24 0806   [2]   Past Medical History:  Diagnosis Date    A-fib (CMS-HCC)     Alcoholism    (CMS-HCC)     Alcoholism /alcohol abuse     Cirrhosis    (CMS-HCC)     Depression     Hypertension     Liver disease     Malignant neoplasm of overlapping sites of right breast in female, estrogen receptor positive    (CMS-HCC) 10/03/2020   [3]   Family History  Problem Relation Age of Onset    Heart disease Mother     No Known Problems Father     No Known Problems Sister     No Known Problems Daughter     No Known Problems Maternal Grandmother     No Known Problems Maternal Grandfather     No Known Problems Paternal Grandmother     No Known Problems Paternal Grandfather     Lupus Brother     No Known Problems Other BRCA 1/2 Neg Hx     Breast cancer Neg Hx     Cancer Neg Hx     Colon cancer Neg Hx     Endometrial cancer Neg Hx     Ovarian cancer Neg Hx     Mental illness Neg Hx     Substance Abuse Disorder Neg Hx    [4]   Past Surgical History:  Procedure Laterality Date    BREAST BIOPSY Right     benign-a long time ago    BREAST BIOPSY Right 07/2020    malignant    BREAST LUMPECTOMY Right     4 2022    CHOLECYSTECTOMY      PR BX/REMV,LYMPH NODE,DEEP AXILL Right 09/03/2020    Procedure: BX/EXC LYMPH NODE; OPEN, DEEP AXILRY NODE;  Surgeon: Alm Elsie Como, MD;  [facility];  Service: Surgical Oncology Breast    PR CARDIOVERSION, ELECTIVE;EXTERN N/A 03/10/2024    Procedure: CARDIOVERSION, ELECTIVE, ELECTRICAL CONVERSION OF ARRHYTHMIA; EXTERNAL;  Surgeon: Sedalia Velma Hamilton, MD;  Location: [facility] OR [facility];  Service: Cardiology    PR ECHO HEART,TRANSESOPHAGEAL,COMPLETE Midline 03/10/2024    Procedure: ECHOCARDIOGRAPHY, TRANSESOPHAGEAL, REAL-TIME WITH IMAGE DOCUMENTATION;  Surgeon: Sedalia Velma Hamilton, MD;  Location[facility];  Service: Cardiology    PR INTRAOPERATIVE SENTINEL LYMPH NODE ID W DYE INJECTION Right 09/03/2020    Procedure: INTRAOPERATIVE IDENTIFICATION SENTINEL LYMPH NODE(S) INCLUDE INJECTION NON-RADIOACTIVE DYE, WHEN PERFORMED;  Surgeon: Alm Elsie Como, MD;  Location: [facility] OR [facility];  Service: Surgical Oncology Breast    PR MASTECTOMY, PARTIAL Right 09/03/2020    Procedure: MASTECTOMY, PARTIAL (EG, LUMPECTOMY, TYLECTOMY, QUADRANTECTOMY, SEGMENTECTOMY);  Surgeon: Alm Elsie Como, MD;  Location: [facility] OR [facility];  Service: Surgical Oncology Breast    PR UPPER GI ENDOSCOPY,BIOPSY N/A  02/03/2018    Procedure: UGI ENDOSCOPY; WITH BIOPSY, SINGLE OR MULTIPLE;  Surgeon: Eleanor Dewey Sorrel, MD;  Location: [facility] GI PROCEDURES [facility];  Service: Gastroenterology    RADIATION Right     unsure finish date    TUBAL LIGATION

## 2024-03-16 LAB — COMPREHENSIVE METABOLIC PANEL
ALBUMIN: 2.6 g/dL — ABNORMAL LOW (ref 3.4–5.0)
ALKALINE PHOSPHATASE: 108 U/L (ref 46–116)
ALT (SGPT): 28 U/L (ref 10–49)
ANION GAP: 8 mmol/L (ref 5–14)
AST (SGOT): 28 U/L (ref <=34)
BILIRUBIN TOTAL: 1.2 mg/dL (ref 0.3–1.2)
BLOOD UREA NITROGEN: 11 mg/dL (ref 9–23)
BUN / CREAT RATIO: 25
CALCIUM: 9.1 mg/dL (ref 8.7–10.4)
CHLORIDE: 105 mmol/L (ref 98–107)
CO2: 36.9 mmol/L — ABNORMAL HIGH (ref 20.0–31.0)
CREATININE: 0.44 mg/dL — ABNORMAL LOW (ref 0.55–1.02)
EGFR CKD-EPI (2021) FEMALE: >90 mL/min/1.73m2 (ref >=60)
GLUCOSE RANDOM: 105 mg/dL (ref 70–179)
POTASSIUM: 3.7 mmol/L (ref 3.4–4.8)
PROTEIN TOTAL: 5.4 g/dL — ABNORMAL LOW (ref 5.7–8.2)
SODIUM: 150 mmol/L — ABNORMAL HIGH (ref 135–145)

## 2024-03-16 LAB — CBC W/ AUTO DIFF
BASOPHILS ABSOLUTE COUNT: 0 10*9/L (ref 0.0–0.1)
BASOPHILS RELATIVE PERCENT: 0.6 %
EOSINOPHILS ABSOLUTE COUNT: 0.3 10*9/L (ref 0.0–0.5)
EOSINOPHILS RELATIVE PERCENT: 6.2 %
HEMATOCRIT: 33.7 % — ABNORMAL LOW (ref 34.0–44.0)
HEMOGLOBIN: 11.5 g/dL (ref 11.3–14.9)
LYMPHOCYTES ABSOLUTE COUNT: 1.2 10*9/L (ref 1.1–3.6)
LYMPHOCYTES RELATIVE PERCENT: 24.5 %
MEAN CORPUSCULAR HEMOGLOBIN CONC: 34.1 g/dL (ref 32.0–36.0)
MEAN CORPUSCULAR HEMOGLOBIN: 33.4 pg — ABNORMAL HIGH (ref 25.9–32.4)
MEAN CORPUSCULAR VOLUME: 98 fL — ABNORMAL HIGH (ref 77.6–95.7)
MEAN PLATELET VOLUME: 8.4 fL (ref 6.8–10.7)
MONOCYTES ABSOLUTE COUNT: 0.6 10*9/L (ref 0.3–0.8)
MONOCYTES RELATIVE PERCENT: 11.3 %
NEUTROPHILS ABSOLUTE COUNT: 2.9 10*9/L (ref 1.8–7.8)
NEUTROPHILS RELATIVE PERCENT: 57.4 %
NUCLEATED RED BLOOD CELLS: 0 /100{WBCs} (ref <=4)
PLATELET COUNT: 79 10*9/L — ABNORMAL LOW (ref 150–450)
RED BLOOD CELL COUNT: 3.44 10*12/L — ABNORMAL LOW (ref 3.95–5.13)
RED CELL DISTRIBUTION WIDTH: 16.4 % — ABNORMAL HIGH (ref 12.2–15.2)
WBC ADJUSTED: 5.1 10*9/L (ref 3.6–11.2)

## 2024-03-16 LAB — BLOOD GAS CRITICAL CARE PANEL, VENOUS
BASE EXCESS VENOUS: 11.3 — ABNORMAL HIGH (ref -2.0–2.0)
CALCIUM IONIZED VENOUS (MG/DL): 4.96 mg/dL (ref 4.40–5.40)
GLUCOSE WHOLE BLOOD: 101 mg/dL (ref 54–400)
HCO3 VENOUS: 33 mmol/L — ABNORMAL HIGH (ref 22–27)
HEMOGLOBIN BLOOD GAS: 11.4 g/dL — ABNORMAL LOW (ref 12.00–16.00)
LACTATE BLOOD VENOUS: 1.2 mmol/L (ref 0.5–1.8)
O2 SATURATION VENOUS: 61.9 % (ref 40.0–85.0)
PCO2 VENOUS: 57 mmHg (ref 40–60)
PH VENOUS: 7.41 (ref 7.32–7.43)
PO2 VENOUS: 32 mmHg — ABNORMAL LOW (ref 35–40)
POTASSIUM WHOLE BLOOD: 3.6 mmol/L (ref 3.4–4.6)
SODIUM WHOLE BLOOD: 146 mmol/L — ABNORMAL HIGH (ref 135–145)

## 2024-03-16 LAB — PROTIME-INR
INR: 1.26
PROTIME: 14.4 s — ABNORMAL HIGH (ref 9.9–12.6)

## 2024-03-16 LAB — MAGNESIUM: MAGNESIUM: 1.9 mg/dL (ref 1.6–2.6)

## 2024-03-16 LAB — PHOSPHORUS: PHOSPHORUS: 1.7 mg/dL — ABNORMAL LOW (ref 2.4–5.1)

## 2024-03-16 MED ADMIN — pantoprazole (Protonix) oral suspension: 40 mg | GASTROENTERAL | @ 14:00:00 | Stop: 2024-03-16

## 2024-03-16 MED ADMIN — thiamine mononitrate (vit B1) tablet 100 mg: 100 mg | GASTROENTERAL | @ 14:00:00 | Stop: 2024-03-16

## 2024-03-16 MED ADMIN — cyanocobalamin (vitamin B-12) tablet 1,000 mcg: 1000 ug | GASTROENTERAL | @ 14:00:00 | Stop: 2024-03-16

## 2024-03-16 MED ADMIN — potassium phosphate (monobasic) (K-PHOS) tablet 1,000 mg: 1000 mg | ORAL | @ 17:00:00 | Stop: 2024-03-17

## 2024-03-16 MED ADMIN — ceFAZolin (ANCEF) IVPB 2 g in 50 ml dextrose (premix): 2 g | INTRAVENOUS | @ 19:00:00 | Stop: 2024-03-18

## 2024-03-16 MED ADMIN — anastrozole (ARIMIDEX) tablet 1 mg: 1 mg | GASTROENTERAL | @ 14:00:00 | Stop: 2024-03-16

## 2024-03-16 MED ADMIN — folic acid (FOLVITE) tablet 1 mg: 1 mg | GASTROENTERAL | @ 14:00:00 | Stop: 2024-03-16

## 2024-03-16 MED ADMIN — ceFAZolin (ANCEF) IVPB 2 g in 50 ml dextrose (premix): 2 g | INTRAVENOUS | @ 11:00:00 | Stop: 2024-03-18

## 2024-03-16 MED ADMIN — acetaminophen (TYLENOL) tablet 650 mg: 650 mg | ORAL | @ 04:00:00

## 2024-03-16 MED ADMIN — magnesium oxide (MAG-OX) tablet 400 mg: 400 mg | ORAL | @ 14:00:00

## 2024-03-16 MED ADMIN — multivitamins, therapeutic with minerals tablet 1 tablet: 1 | GASTROENTERAL | @ 14:00:00 | Stop: 2024-03-16

## 2024-03-16 MED ADMIN — potassium phosphate (monobasic) (K-PHOS) tablet 1,000 mg: 1000 mg | ORAL | @ 21:00:00 | Stop: 2024-03-17

## 2024-03-16 NOTE — Unmapped (Signed)
 Physical Medicine and Rehab  Consult Note    Requesting Attending Physician: Suzen JINNY Broach, MD  Service Requesting Consult: Geriatrics (MDA)    ASSESSMENT / RECOMMENDATIONS:     [name] is a 78 y.o. 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 Found to have acute hypercarbic respiratory failure, acute hepatic encephalopathy, and hypothermia following procedure, prompting intubation. Transferred to MICU for further   Treated for sepsis 2/2 e coli bacteremia, hematochezia likely related to colonic mass.  The patient is seen in consultation for evaluation of rehabilitation needs.    Functional Impairment   Impaired mobility, gait and  self care due to complicated hospitalization  - Please continue to have patient work with PT and OT to maximize functional status with mobility and ADLs.  - Nutritional support    Functional Goals  Get stronger and return home with family    Rehabilitation Plan of Care    - Patient has complex rehab, nursing, and medical needs and may become appropriate for Acute Inpatient Rehabilitation with regular therapy participation / demonstrated progress and completion of medical workup / formulation of treatment plan.  In AIR the patient would be expected to tolerate minimum of 3 hrs of therapy daily, 5x/week.    Ongoing issues that require acute inpatient rehab management: Ongoing delirium risk, hypercarbia from relative hypoventilation, e coli bacteremia req IV antibiotics, sigmoid mass, risk for recurrent hematochezia, cirrhosis, congestive heart failure and GDMT, afib,     Checklist for Potential AIR:  [ ]  EDD/what is estimated discharge date?  [ ]  Referral from CM if wants [place] [facility]  [ ]  PT/OT within 48 hours of AIR  [ ]  dispo verified and [place] [facility] preference  [ ]  CBC/BMP & other pertinent labs within 48 hours of AIR    Dispo: home with family after rehab     *This PM&R consult does not guarantee that patient has been accepted to [facility], but can be helpful to guide patient progression.*  In order for the patient to be considered for admission to [facility] inpatient rehab, the case manager must give the patient / family choice in post-acute discharge options AND if the patient desires [facility], place a referral order to [facility] inpatient rehab. A referral for [facility] inpatient rehab has been received. The case manager may contact the Rehab Intake Coordinator with questions about acceptance to [facility],  bed availability and insurance authorization.     Deward Adjutant, MD    Thank you for this consult.  Please contact the PM&R consult pager [phone]) for questions regarding these recommendations.  For questions of bed availability at [facility], contact the Intake Office at [phone].      SUBJECTIVE:     Reason for Consult: Patient seen in consultation at the request of Suzen JINNY Broach, MD for evaluation of rehabilitation needs and recommendations.  Chief Complaint: generalized weakness  History of Present Illness: [name] is a 78 y.o. 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 Found to have acute hypercarbic respiratory failure, acute hepatic encephalopathy, and hypothermia following procedure, prompting intubation. Transferred to  MICU for further management.   Treated for sepsis 2/2 e coli bacteremia, hematochezia likely related to colonic mass.   Today patient is feeling better. Eating PO diet now off NGT. Bowel and bladder functioning. No chest pain or sob. No pain complaints    Prior Functional Status:      Prior Functional Status: Per PT eval 10/3: IND at home for mobility and self care, lives with spouse, daughter, and granddaughter. Daughter and granddaughter can assist as needed. Mostly sedentary, does not leave the house much. Denies recent falls    Current Functional Status: Therapy notes reviewed and analyzed     Activities of Daily Living:   Assessment  Problem List: Decreased cognition, Impaired judgement, Decreased strength, Decreased coordination, Decreased activity tolerance, Impaired balance, Decreased skin integrity, Decreased endurance, Bowel dysfunction, Decreased mobility, Gait deviation, Fall risk, Impaired ADLs  Personal Factors/Comorbidities (Occupational Profile and History Review): Expanded (Moderate)  Assessment of Occupational Performance : Balance, Cognitive skills, Endurance, Mobility, Strength, Fine or gross motor coordination  Clinical Decision Making: Moderate Complexity  Assessment: 78yo F w HFpEF, HTN, Afib on AC, MAFLD Cirrhosis, presented for scheduled TEE/DCCV (aborted d/t LAA clot), admitted 10/9 for [facility], encephalopathy and septic shock.   CHG (Chlorhexidine Gluconate) Treatment: Wipes  Today's Interventions: OT eval,               Mobility:   Bed Mobility comments: HOB partially elevated        Skilled Treatment Performed: Pt ambulated 60 ft, 80 ft with RW + CGA. Pt with forward flexed posture, decreased gait speed, lateral path deviation requiring cues to look ahead / stay in middle of hallway. Seated rest break taken for safety, chair follow for safety. Max cueing needed for turn sequencing.  PT Post Acute Discharge Recommendations: 5x weekly, High intensity    Cognition, Swallow, Speech:   Cognition / Swallow / Speech  Patient's Vision Adequate to Safely Complete Daily Activities: Unable to assess  Patient's Judgement Adequate to Safely Complete Daily Activities: Unable to assess  Patient's Memory Adequate to Safely Complete Daily Activities: Unable to assess  Patient Able to Express Needs/Desires: Unable to assess  Patient has speech problem: Recent change             Assistive Devices: Rollator, Rolling walker    Precautions:  Safety Interventions  Safety Interventions: aspiration precautions, bed alarm, bleeding precautions, commode/urinal/bedpan at bedside, environmental modification, fall reduction program maintained, family at bedside, infection management, lighting adjusted for tasks/safety, low bed, nonskid shoes/slippers when out of bed  Aspiration Precautions: awake/alert before oral intake, respiratory status monitored, upright posture maintained  Bleeding Precautions: blood pressure closely monitored, coagulation study results reviewed, foot protection facilitated, gentle oral care promoted, monitored for signs of bleeding  Infection Management: aseptic technique maintained    Medical / Surgical History:   Past Medical History[1]  Past Surgical History[2]     Social History:   Short Social History[3]    Living Environment: House  Lives With: Spouse, Daughter, Family (granddaughter)  Home Living: Multi-level home, Walk-in shower, Raised toilet seat without rails, Stairs to enter with rails, Stairs to alternate level with rails, Built-in shower seat  Rail placement (outside): Bilateral rails in reach  Number of Stairs to Enter (outside): 5  Number of Stairs to Alternate level (inside):  (need to clarify)    Family History: Reviewed and non-contributory to rehab needs  family history includes Heart disease in her mother; Lupus in her brother; No Known Problems in her  daughter, father, maternal grandfather, maternal grandmother, paternal grandfather, paternal grandmother, sister, and another family member.    Allergies:   Sulfa (sulfonamide antibiotics), Sulfur , and Aspirin    Medications:   Scheduled Scheduled meds with Route[4]  PRN acetaminophen , 650 mg, Q4H PRN  docusate sodium, 100 mg, BID PRN  ondansetron , 4 mg, Q8H PRN  polyethylene glycol, 17 g, Daily PRN      Continuous Infusions      Review of Systems:    General ROS: generalized weakness  Psychological ROS: negative  Ophthalmic ROS: negative  Respiratory ROS: no cough, shortness of breath, or wheezing  Cardiovascular ROS: no chest pain or dyspnea on exertion  Gastrointestinal ROS: no abdominal pain, change in bowel habits, or black or bloody stools  Genito-Urinary ROS: no dysuria, trouble voiding, or hematuria  Musculoskeletal ROS: negative  Neurological ROS: no TIA or stroke symptoms  Dermatological ROS: negative  Full 10 systems reviewed and neg, unless noted in HPI  OBJECTIVE:     Vitals:  Temp:  [35.8 ??C (96.4 ??F)-36.7 ??C (98.1 ??F)] 35.8 ??C (96.4 ??F)  Pulse:  [79-91] 91  SpO2 Pulse:  [79-91] 89  Resp:  [19-26] 19  BP: (128-153)/(58-78) 143/64  MAP (mmHg):  [80-102] 86  SpO2:  [95 %-98 %] 98 %    Physical Exam:    GEN: Lying in bed in NAD.  HEENT: Atraumatic. Normocephalic. Moist mucous membranes. Trachea midline.  RESP: NWOB on RA.  CV: RRR trace pedal edema  GI: abd soft, NTND  GU: no Foley   SKIN: no rashes or ecchymoses on exposed skin  MSK:  no notable contractures, no visible swelling or erythema over joints, joints NTTP  NEURO:  Mental Status: A&Ox3, attention intact, speech coherent, follows commands well  Cranial Nerve:   visual acuity intact, no visual fields deficits, pupils equal, EOMI, facial sensation bilaterally, no facial droop, hearing grossly intact b/l, shoulder shrug full and equal, tongue protrudes midline  Sensory: BUE and BLE sensation intact to light touch  Motor:     RUE/LUE: shoulder abd 4+/4+, biceps 4+/4+, triceps 4+/4+, wrist extension 4+/4+, hand grasp 4+/4+    RLE/LLE: hip flexion 4/4, knee extension 4+/4+, DF 4+/4+, 1st toe extension 4+/4+ PF 4+/4+  Tone: within normal limits, no spasticity noted  Reflexes: no clonus  Cerebellar: no abnml or extraneous mvmts, no pronator drift  PSYCH: mood euthymic, affect appropriate, thought process logical     Labs and Diagnostic Studies: Reviewed   CBC - Results in Past 30 Days  Result Component Current Result Ref Range Previous Result Ref Range   HCT 33.7 (L) (03/16/2024) 34.0 - 44.0 % 33.2 (L) (03/15/2024) 34.0 - 44.0 %   HGB 11.5 (03/16/2024) 11.3 - 14.9 g/dL 89.1 (L) (89/85/7974) 87.9 - 16.0 g/dL   MCH 66.5 (H) (89/84/7974) 25.9 - 32.4 pg 32.4 (03/15/2024) 25.9 - 32.4 pg   MCHC 34.1 (03/16/2024) 32.0 - 36.0 g/dL 67.3 (89/85/7974) 67.9 - 36.0 g/dL   MCV 01.9 (H) (89/84/7974) 77.6 - 95.7 fL 99.5 (H) (03/15/2024) 77.6 - 95.7 fL   MPV 8.4 (03/16/2024) 6.8 - 10.7 fL 10.3 (03/15/2024) 6.8 - 10.7 fL   Platelet 79 (L) (03/16/2024) 150 - 450 10*9/L 59 (L) (03/15/2024) 150 - 450 10*9/L   RBC 3.44 (L) (03/16/2024) 3.95 - 5.13 10*12/L 3.33 (L) (03/15/2024) 3.95 - 5.13 10*12/L   WBC 5.1 (03/16/2024) 3.6 - 11.2 10*9/L 5.0 (03/15/2024) 3.6 - 11.2 10*9/L     BMP - Results in Past 30 Days  Result Component Current Result Ref Range Previous Result Ref Range   BUN 11 (03/16/2024) 9 - 23 mg/dL 11 (89/85/7974) 9 - 23 mg/dL   Chloride 894 (89/84/7974) 98 - 107 mmol/L 108 (H) (03/15/2024) 98 - 107 mmol/L      111 (H) (03/15/2024) 98 - 107 mmol/L   CO2 36.9 (H) (03/16/2024) 20.0 - 31.0 mmol/L 33.0 (H) (03/15/2024) 20.0 - 31.0 mmol/L   Creatinine 0.44 (L) (03/16/2024) 0.55 - 1.02 mg/dL 9.52 (L) (89/85/7974) 9.44 - 1.02 mg/dL   Glucose 894 (89/84/7974) 70 - 179 mg/dL 874 (89/85/7974) 70 - 820 mg/dL   Potassium 3.7 (89/84/7974) 3.4 - 4.8 mmol/L 3.8 (03/15/2024) 3.4 - 4.8 mmol/L    3.6 (03/16/2024) 3.4 - 4.6 mmol/L 3.7 (03/15/2024) 3.4 - 4.6 mmol/L   Sodium 150 (H) (03/16/2024) 135 - 145 mmol/L 149 (H) (03/15/2024) 135 - 145 mmol/L    146 (H) (03/16/2024) 135 - 145 mmol/L 149 (H) (03/15/2024) 135 - 145 mmol/L     Coagulation - Results in Past 30 Days  Result Component Current Result Ref Range Previous Result Ref Range   APTT 27.8 (03/15/2024) 24.8 - 38.4 sec 30.6 (03/14/2024) 24.8 - 38.4 sec   INR 1.26 (03/16/2024)  1.22 (03/15/2024)    PT 14.4 (H) (03/16/2024) 9.9 - 12.6 sec 13.9 (H) (03/15/2024) 9.9 - 12.6 sec     Cardiac markers -   No results found for requested labs within last 30 days.     LFT's - Results in Past 30 Days  Result Component Current Result Ref Range Previous Result Ref Range   Albumin 2.6 (L) (03/16/2024) 3.4 - 5.0 g/dL 2.5 (L) (89/85/7974) 3.4 - 5.0 g/dL   Alkaline Phosphatase 108 (03/16/2024) 46 - 116 U/L 104 (03/15/2024) 46 - 116 U/L   ALT 28 (03/16/2024) 10 - 49 U/L 30 (03/15/2024) 10 - 49 U/L   AST 28 (03/16/2024) <=34 U/L 26 (03/15/2024) <=34 U/L   Bilirubin, Direct 0.90 (H) (03/11/2024) 0.00 - 0.30 mg/dL 9.49 (H) (0/70/7974) 9.99 - 0.30 mg/dL   Total Bilirubin 1.2 (03/16/2024) 0.3 - 1.2 mg/dL 1.1 (89/85/7974) 0.3 - 1.2 mg/dL       Radiology Results: Reviewed   XR Abdomen 1 View  Result Date: 03/14/2024  EXAM: XR ABDOMEN 1 VIEW ACCESSION: [id number] UN REPORT DATE: 03/14/2024 1:57 PM     CLINICAL INDICATION: 78 years old with NGT (CATHETER VASCULAR FIT & ADJ)      COMPARISON: 03/11/2024 supine abdominal radiograph     TECHNIQUE: Supine view of the abdomen.     FINDINGS: Multiple clips project over the right breast and axilla. Bibasilar airspace disease. Interval removal of the unweighted esophagogastric tube. Placement of a second tube with tip projecting over the left iliac wing. Urethral catheter.     Nonspecific bowel gas pattern without evidence of obstruction.         A second esophagogastric tube has been placed with tip projecting over the left iliac wing in the proximal small bowel.    XR Chest Portable  Result Date: 03/14/2024  EXAM: XR CHEST PORTABLE ACCESSION: [id number] UN REPORT DATE: 03/14/2024 12:17 AM     CLINICAL INDICATION: ETT (VENTILATOR/RESPIRATOR DEP STATUS)      TECHNIQUE: Single View AP Chest Radiograph.     COMPARISON: XR CHEST PORTABLE 03/11/2024     FINDINGS:     Interval removal of left internal jugular vein approach central venous catheter, additional support devices are unchanged.     Stable moderate pulmonary edema and small bilateral pleural effusions  with bilateral medial basilar atelectasis. No pneumothorax.     Stable cardiomediastinal silhouette.             Interval removal of left internal jugular vein approach central venous catheter, otherwise stable chest.               For coding purposes:   - This patient was seen by the provider Lenoard Adjutant, MD).  - This encounter should be coded as a an inpatient consultation.    I Lenoard Adjutant, MD) personally spent 65 minutes face-to-face and non-face-to-face in the care of this patient, which includes all pre, intra, and post visit time on the date of service. All documented time was specific to the E/M visit and does not include any procedures that may have been performed. Care time for the patient included face-to-face time with the patient, reviewing the patient's chart, communicating with the family and/or other professionals and coordinating care.             [1]   Past Medical History:  Diagnosis Date    A-fib (CMS-HCC)     Alcoholism    (CMS-HCC)     Alcoholism /alcohol abuse     Cirrhosis    (CMS-HCC)     Depression     Hypertension     Liver disease     Malignant neoplasm of overlapping sites of right breast in female, estrogen receptor positive    (CMS-HCC) 10/03/2020   [2]   Past Surgical History:  Procedure Laterality Date    BREAST BIOPSY Right     benign-a long time ago    BREAST BIOPSY Right 07/2020    malignant    BREAST LUMPECTOMY Right     4 2022    CHOLECYSTECTOMY      PR BX/REMV,LYMPH NODE,DEEP AXILL Right 09/03/2020    Procedure: BX/EXC LYMPH NODE; OPEN, DEEP AXILRY NODE;  Surgeon: Alm Elsie Como, MD;  Location: ASC OR [facility];  Service: Surgical Oncology Breast    PR CARDIOVERSION, ELECTIVE;EXTERN N/A 03/10/2024    Procedure: CARDIOVERSION, ELECTIVE, ELECTRICAL CONVERSION OF ARRHYTHMIA; EXTERNAL;  Surgeon: Sedalia Velma Hamilton, MD;  [facility] OR [facility];  Service: Cardiology    PR ECHO HEART,TRANSESOPHAGEAL,COMPLETE Midline 03/10/2024    Procedure: ECHOCARDIOGRAPHY, TRANSESOPHAGEAL, REAL-TIME WITH IMAGE DOCUMENTATION;  Surgeon: Sedalia Velma Hamilton, MD;  Location: [facility] OR [facility];  Service: Cardiology    PR INTRAOPERATIVE SENTINEL LYMPH NODE ID W DYE INJECTION Right 09/03/2020    Procedure: INTRAOPERATIVE IDENTIFICATION SENTINEL LYMPH NODE(S) INCLUDE INJECTION NON-RADIOACTIVE DYE, WHEN PERFORMED;  Surgeon: Alm Elsie Como, MD;  Location: ASC OR [facility];  Service: Surgical Oncology Breast    PR MASTECTOMY, PARTIAL Right 09/03/2020    Procedure: MASTECTOMY, PARTIAL (EG, LUMPECTOMY, TYLECTOMY, QUADRANTECTOMY, SEGMENTECTOMY);  Surgeon: Alm Elsie Como, MD;  Location: ASC OR [facility];  Service: Surgical Oncology Breast    PR UPPER GI ENDOSCOPY,BIOPSY N/A 02/03/2018    Procedure: UGI ENDOSCOPY; WITH BIOPSY, SINGLE OR MULTIPLE;  Surgeon: Eleanor Dewey Sorrel, MD;  Location: [facility] PROCEDURES [facility];  Service: Gastroenterology    RADIATION Right     unsure finish date    TUBAL LIGATION     [3]   Social History  Tobacco Use    Smoking status: Never     Passive exposure: Past    Smokeless tobacco: Never   Vaping Use    Vaping status: Never Used   Substance Use Topics    Alcohol use: Not Currently  Drug use: Never   [4]    [Provider Hold] carvedilol  (COREG ) tablet 3.125 mg BID    ceFAZolin  (ANCEF ) IVPB 2 g in 50 ml dextrose  (premix) Q8H SCH    flu vacc 2025-26 (65 yr up) (PF)(FLUAD)45 mcg(8mcgx3)/0.5 ml IM syringe During hospitalization    magnesium  oxide (MAG-OX) tablet 400 mg BID    potassium phosphate (monobasic) (K-PHOS) tablet 1,000 mg 4xd Meals & HS

## 2024-03-16 NOTE — Unmapped (Signed)
 Acknowledge and appreciate orders for clinical swallowing evaluation. Per EHR review, patient was seen by [facility] SLP yesterday and cleared from IDDSI Level 6 Soft and Bite Sized and Level 0 Thin Liquids, with no further swallowing needs. SLP to sign off at this time, please reconsult with any acute concerns. MD aware.

## 2024-03-16 NOTE — Unmapped (Signed)
 Acute Inpatient Rehab referral has been received and patient is currently under review.       Please be advised, [place] [facility] is now located at the [facility].      Visitation Policy:  [place] [facility] allows visitors (6 AM- 9 PM) and one visitor may be designated to stay overnight. The overnight visitor must be in the facility prior to 9 PM.       Selinda Schmitz MS, PT  [facility]  Inpatient Coordinator   Office [phone]   12:41 PM 03/16/2024       Admission Criteria for Acute Inpatient Rehab:     The patient has medical need that will require daily physician oversight.  The patient requires at least two therapy disciplines (PT, OT, ST) at a high frequency.  It is anticipated that the patient will make significant functional improvement in a reasonable length of time.   The patient is able to participate in and tolerate a minimum of three hours of therapy per day.   The patient or his/her representative consents to the admission.  The patient/family has realistic goals that include discharge to a community setting (other than SNF) and has adequate assistance at home.

## 2024-03-16 NOTE — Unmapped (Signed)
 Geriatrics (MEDA) Progress Note    Assessment & Plan:   [name] is a 78 y.o. 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 Found to have acute hypercarbic respiratory failure, acute hepatic encephalopathy, and hypothermia following procedure, prompting intubation. Transferred to MICU for further management. She has since been extubated to nasal cannulated and weaned to room air. Able to ambulate with PT. Patient down graded to step down status and transferred to [facility] for further management.     Principal Problem:    Bacteremia, escherichia coli  Active Problems:    Alcoholic liver disease (HHS-HCC)    HTN (hypertension)    Depression with anxiety    Class 2 severe obesity with serious comorbidity in adult    Atrial fibrillation    (CMS-HCC)    Pleural effusion    Hypernatremia    Thrombocytopenia    Cirrhosis    (CMS-HCC)    A-fib (CMS-HCC)    Hepatic encephalopathy    (CMS-HCC)      Toxic Metabolic Encephalopathy, improving  Patient presented for scheduled TEE/DCCV with Cardiology on 10/10, received 50 mg propofol  and found to be difficult to wake following sedation with hypercarbic respiratory failure. Per chart review, initially poorly responsive to stimulation and started on NIPPV with BiPAP. Hypercarbia improved but remained altered eventually requiring intubation as below. Suspected likely multifactorial in the setting of hypercarbia, electrolyte abnormalities, HE, infection.    - CTH 10/10 wo acute intracranial process  - Successfully extubated 10/13.   - Delirium precautions  - Given no asterixis and mental status improving s/p extubation, discontinued lactulose      S/p Procedural Sedation c/b Acute on Chronic Hypoxic and Hypercarbic Respiratory Failure I  I Likely OSA    Presented with somnolence and difficulty to rouse s/p TEE/DCCV sedation as above. Transferred to CCU w/ pulm consult and placed on NIPPV. CXR with slight pulmonary edema s/p diuresis. Hypercarbia corrected on BiPAP then AVAPS from 86 > 58 but ultimately required intubation due to acute hypoxic respiratory failure from hypoventilation likely due to encephalopathy. Extubated to Brady on 10/13 and now stable on room air.   - BiPAP overnight  - VBG q24h     Sepsis 2/2 E. Coli Bacteremia  Afebrile upon arrival to scheduled procedure via axillary temp, but found to be hypothermic to 34.1C via foley catheter. Otherwise, HDS without leukocytosis. Hypothermia improved with Bair Hugger. Eventually sedated and intubated as above for acute hypoxic respiratory failure and encephalopathy. No fluid pocket for diagnostic paracentesis on POCUS. CXR with interstitial edema and bilateral pulmonary effusions but no focal consolidation. UA noninfectious. Blood cultures 10/10 found to be positive for E. Coli. Required levophed briefly but able to wean off since 10/12.  Initially started on Zosyn, now narrowed to Cefazolin  as below.  Unclear etiology though possible colonic source iso mass-like thickening involving the lower sigmoid colon.   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 No hematemesis. S/p  PPI, ceftriaxone, and octreotide for GIB PPX. Low suspicion for variceal bleeding given HD stability and stable hemoglobin. Suspect related to colonic mass. Has had some recurrent hematochezia since 10/13, however hemoglobin remains stable and GI recommends outpatient management.  - PO daily PPI  - On Ancef , resume CTX ppx after  - Daily CBC  - Colonoscopy outpatient for incidental finding concerning for sigmoid colon malignancy  - Follow-up with luminal GI 10/16     Chronic HFpEF - HTN  On admission, pro-BNP 520, slightly elevated from 490 at previous discharge but improved from 846 on 02/29/24. CXR with interstitial edema and bilateral pulmonary effusions. GDMT held due to c/f acute infection but can consider restarting in coming days pending clinical improvement.  - HOLD home GDMT in setting of acute infection:  - BB: Coreg  3.125 mg BID  - ARB/ACEi/ARNI: hold iso hypotension/sepsis  - MRA: Aldactone 25 mg daily  - SGLT2i: Farxiga 47$/month - consider as outpatient  - HOLD Diuretics  - Daily Weights, EDW 195 lbs  - Strict I/Os  - Goal K >4, Mg >2  - Daily BMP, Mg     Atrial Fibrillation - Left Atrial Appendage Thrombus (03/10/24)  New onset (03/2024), continued on home Coreg  3.125 mg BID. Started on Eliquis (cost-prohibitive due to not meeting deductible). Attempted TEE DCCV however aborted due to LAA thrombus. Unable to anticoagulate due to c/f GIB. Attempted to restart SQH ppx on 10/12-10/13, however patient had recurrent hematochezia and some light red secretions after extubation so all anticoagulation was held.  - HOLD Eliquis 5 mg BID in setting of GIB  - HOLD BB as above  - Follow-up benign hematology 10/16     MASLD Cirrhosis - Hx Hepatic Encephalopathy I Thrombocytopenia  History of decompensated metabolic-associated cirrhosis with HE, G1 EV and PHG on EGD 01/2018. Previously followed with hepatology but lost to follow up in 2020. Formerly prescribed lactulose  but has not been on it in several years. Presented altered and encephalopathic following sedation as above. Ammonia level elevated 192 -> 251 -> 82 from previous level of 30. Liver doppler US  negative for acute thrombosis. Had been receiving lactulose  enemas at OSH. Concern for development of GI bleed on 10/10 AM, GI consulted with low suspicion for variceal bleed, initially recommend placing NG tube for lactulose  administration to prevent further bleed. Most recently recommend stopping lactulose  given absence of asterixis and to avoid worsening electrolyte abnormalities and bowel urgency.   - Hepatology consulted, appreciate recommendations  - Discontinue Lactulose  via NG tube, goal 4-5x BM/day  - HOLD diuretics as above iso electrolyte abnormalities  - Daily MELD labs  - Follow up with GI outpatient    MELD 3.0: 12 at 03/16/2024  6:28 AM  MELD-Na: 10 at 03/16/2024  6:28 AM  Calculated from:  Serum Creatinine: 0.44 mg/dL (Using min of 1 mg/dL) at 89/84/7974  3:71 AM  Serum Sodium: 150 mmol/L (Using max of 137 mmol/L) at 03/16/2024  6:28 AM  Total Bilirubin: 1.2 mg/dL at 89/84/7974  3:71 AM  Serum Albumin: 2.6 g/dL at 89/84/7974  3:71 AM  INR(ratio): 1.26 at 03/16/2024  6:28 AM  Age at listing (hypothetical): 78 years  Sex: Female at 03/16/2024  6:28 AM    Hypernatremia  Suspected iso poor po intake. S/p post pyloric corpak placement. Previously discontinued D5 and adjusting FWF.    - Na level daily      Chronic Problems     Vertebral Compression Fracture  T12 vertebral compression fracture with  retropulsion into the ventral spinal canal on CT. Has known history of osteoporosis identified on Dexa scan of femoral neck (11/2022 T score -2.9).      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  Behavioral Symptoms (baseline or now): No.     Mobility Baseline functional status: Independent in ADLs  Current functional status: Needs Assistance with ADLs  PT/OT Ordered: Recommend AIR   Medications Reviewed home medications, screening for potentially inappropriate medications: Yes  Medications recommended de-prescribing: n/a   What Matters Geri Assessment complete: No, needs to be done         Issues Impacting Complexity of Management:  -Intensive monitoring of drug toxicity from Zofran  with EKG to monitor QTc and cefazolin  with BMP  -High risk of complications from pain and/or analgesia likely to result in delirium  -The patient is at high risk from Hospital immobility in an elderly patient given baseline poor functional status with a high risk of causing delirium and further decline in function  -The patient is at high risk for the development of complications of volume overload due to the need to provide IV hydration for suspected hypovolemia in the setting of: heart failure and Cirrhosis  -Need for the following intensive monitoring parameter(s) due to high risk of clinical decline: frequent monitoring of urine output to assess for the efficacy of diuretic regimen  -The patient is at high risk of complications from delirium      Daily Checklist:  Diet: GI Softs  DVT PPx: Contraindicated - High Risk for Bleeding/Active Bleeding; however, patient is hypercoagulable  Electrolytes: Replete Potassium to >/=4 and Magnesium  to >/=2  Code Status: Full Code  Dispo: AIR    Team Contact Information:   Primary Team: Geriatrics ([facility])  Primary Resident: Lacinda Rimes, MD, MD  Resident's Pager: [phone] (Geriatrics Intern - [place])    Interval History:   No acute events overnight.    Patient is doing well this morning.  She is primarily concerned with removing her NG tube and being able to walk around the unit as she did previously at [facility].  Still displaying some inattention and inability to follow conversations.  While discussing the plan, will deviate and talk about other aspects of her care.  Denies any pain (including chest and abdominal), shortness of breath, or other concerns at this time.  She is interested in eating again.  Denies any nausea.    Objective:   Temp:  [35.8 ??C (96.4 ??F)-36.6 ??C (97.8 ??F)] 36.5 ??C (97.7 ??F)  Pulse:  [81-101] 101  SpO2 Pulse:  [80-97] 97  Resp:  [16-33] 16  BP: (129-153)/(63-78) 129/63  SpO2:  [95 %-98 %] 97 %    Gen: NAD  Eyes: sclera anicteric  HENT: MMM, OP w/o erythema or exudate   Heart: RRR, well-perfused  Lungs: CTAB, no crackles or wheezes anteriorly  Abdomen: soft, NTND  Extremities: no clubbing, cyanosis, minimal edema in the BLEs  Psych: Alert, appropriate mood and affect, attends to examiner, but overall and attentive to conversation topics    Labs/Studies: Labs and Studies from the last 24hrs per EMR and Reviewed    Lacinda Rimes, MD  PGY-1, [facility] Internal Medicine

## 2024-03-17 LAB — CBC W/ AUTO DIFF
BASOPHILS ABSOLUTE COUNT: 0.1 10*9/L (ref 0.0–0.1)
BASOPHILS RELATIVE PERCENT: 2.9 %
EOSINOPHILS ABSOLUTE COUNT: 0.3 10*9/L (ref 0.0–0.5)
EOSINOPHILS RELATIVE PERCENT: 5.7 %
HEMATOCRIT: 33.6 % — ABNORMAL LOW (ref 34.0–44.0)
HEMOGLOBIN: 11.4 g/dL (ref 11.3–14.9)
LYMPHOCYTES ABSOLUTE COUNT: 1.1 10*9/L (ref 1.1–3.6)
LYMPHOCYTES RELATIVE PERCENT: 21.7 %
MEAN CORPUSCULAR HEMOGLOBIN CONC: 34 g/dL (ref 32.0–36.0)
MEAN CORPUSCULAR HEMOGLOBIN: 33.4 pg — ABNORMAL HIGH (ref 25.9–32.4)
MEAN CORPUSCULAR VOLUME: 98.3 fL — ABNORMAL HIGH (ref 77.6–95.7)
MEAN PLATELET VOLUME: 8.2 fL (ref 6.8–10.7)
MONOCYTES ABSOLUTE COUNT: 0.5 10*9/L (ref 0.3–0.8)
MONOCYTES RELATIVE PERCENT: 8.8 %
NEUTROPHILS ABSOLUTE COUNT: 3.1 10*9/L (ref 1.8–7.8)
NEUTROPHILS RELATIVE PERCENT: 60.9 %
NUCLEATED RED BLOOD CELLS: 0 /100{WBCs} (ref <=4)
PLATELET COUNT: 87 10*9/L — ABNORMAL LOW (ref 150–450)
RED BLOOD CELL COUNT: 3.42 10*12/L — ABNORMAL LOW (ref 3.95–5.13)
RED CELL DISTRIBUTION WIDTH: 16.4 % — ABNORMAL HIGH (ref 12.2–15.2)
WBC ADJUSTED: 5.1 10*9/L (ref 3.6–11.2)

## 2024-03-17 LAB — COMPREHENSIVE METABOLIC PANEL
ALBUMIN: 2.3 g/dL — ABNORMAL LOW (ref 3.4–5.0)
ALKALINE PHOSPHATASE: 100 U/L (ref 46–116)
ALT (SGPT): 21 U/L (ref 10–49)
ANION GAP: 10 mmol/L (ref 5–14)
AST (SGOT): 30 U/L (ref <=34)
BILIRUBIN TOTAL: 1.4 mg/dL — ABNORMAL HIGH (ref 0.3–1.2)
BLOOD UREA NITROGEN: 10 mg/dL (ref 9–23)
BUN / CREAT RATIO: 21
CALCIUM: 8.6 mg/dL — ABNORMAL LOW (ref 8.7–10.4)
CHLORIDE: 104 mmol/L (ref 98–107)
CO2: 35.5 mmol/L — ABNORMAL HIGH (ref 20.0–31.0)
CREATININE: 0.47 mg/dL — ABNORMAL LOW (ref 0.55–1.02)
EGFR CKD-EPI (2021) FEMALE: >90 mL/min/1.73m2 (ref >=60)
GLUCOSE RANDOM: 79 mg/dL (ref 70–179)
POTASSIUM: 3.8 mmol/L (ref 3.4–4.8)
PROTEIN TOTAL: 5 g/dL — ABNORMAL LOW (ref 5.7–8.2)
SODIUM: 149 mmol/L — ABNORMAL HIGH (ref 135–145)

## 2024-03-17 LAB — BLOOD GAS CRITICAL CARE PANEL, VENOUS
BASE EXCESS VENOUS: 10.3 — ABNORMAL HIGH (ref -2.0–2.0)
CALCIUM IONIZED VENOUS (MG/DL): 4.74 mg/dL (ref 4.40–5.40)
GLUCOSE WHOLE BLOOD: 73 mg/dL (ref 54–400)
HCO3 VENOUS: 32 mmol/L — ABNORMAL HIGH (ref 22–27)
HEMOGLOBIN BLOOD GAS: 11.4 g/dL — ABNORMAL LOW (ref 12.00–16.00)
LACTATE BLOOD VENOUS: 1.5 mmol/L (ref 0.5–1.8)
O2 SATURATION VENOUS: 74.1 % (ref 40.0–85.0)
PCO2 VENOUS: 53 mmHg (ref 40–60)
PH VENOUS: 7.43 (ref 7.32–7.43)
PO2 VENOUS: 40 mmHg (ref 35–40)
POTASSIUM WHOLE BLOOD: 3.6 mmol/L (ref 3.4–4.6)
SODIUM WHOLE BLOOD: 144 mmol/L (ref 135–145)

## 2024-03-17 LAB — PROTIME-INR
INR: 1.37
PROTIME: 15.6 s — ABNORMAL HIGH (ref 9.9–12.6)

## 2024-03-17 LAB — MAGNESIUM: MAGNESIUM: 1.9 mg/dL (ref 1.6–2.6)

## 2024-03-17 LAB — PHOSPHORUS: PHOSPHORUS: 2.6 mg/dL (ref 2.4–5.1)

## 2024-03-17 MED ADMIN — thiamine mononitrate (vit B1) tablet 100 mg: 100 mg | ORAL | @ 13:00:00

## 2024-03-17 MED ADMIN — ceFAZolin (ANCEF) IVPB 2 g in 50 ml dextrose (premix): 2 g | INTRAVENOUS | @ 09:00:00 | Stop: 2024-03-17

## 2024-03-17 MED ADMIN — folic acid (FOLVITE) tablet 1 mg: 1 mg | ORAL | @ 13:00:00

## 2024-03-17 MED ADMIN — magnesium oxide (MAG-OX) tablet 400 mg: 400 mg | ORAL | @ 02:00:00

## 2024-03-17 MED ADMIN — ceFAZolin (ANCEF) IVPB 2 g in 50 ml dextrose (premix): 2 g | INTRAVENOUS | @ 19:00:00 | Stop: 2024-03-17

## 2024-03-17 MED ADMIN — ceFAZolin (ANCEF) IVPB 2 g in 50 ml dextrose (premix): 2 g | INTRAVENOUS | @ 02:00:00 | Stop: 2024-03-18

## 2024-03-17 MED ADMIN — magnesium oxide (MAG-OX) tablet 400 mg: 400 mg | ORAL | @ 13:00:00

## 2024-03-17 MED ADMIN — cyanocobalamin (vitamin B-12) tablet 1,000 mcg: 1000 ug | ORAL | @ 13:00:00

## 2024-03-17 MED ADMIN — anastrozole (ARIMIDEX) tablet 1 mg: 1 mg | ORAL | @ 13:00:00

## 2024-03-17 MED ADMIN — multivitamins, therapeutic with minerals tablet 1 tablet: 1 | ORAL | @ 13:00:00

## 2024-03-17 MED ADMIN — pantoprazole (Protonix) EC tablet 40 mg: 40 mg | ORAL | @ 13:00:00

## 2024-03-17 MED ADMIN — potassium phosphate (monobasic) (K-PHOS) tablet 1,000 mg: 1000 mg | ORAL | @ 13:00:00 | Stop: 2024-03-17

## 2024-03-17 MED ADMIN — potassium phosphate (monobasic) (K-PHOS) tablet 1,000 mg: 1000 mg | ORAL | @ 02:00:00 | Stop: 2024-03-17

## 2024-03-17 NOTE — Unmapped (Signed)
 Shift Summary  ceFAZolin  was administered twice during the shift for bacteremia management.   NPPV was discontinued due to intolerance, but oxygenation remained stable on room air.   CBC results revealed low RBC, hematocrit, and platelets, with normal WBC and no acute changes in infection markers.   Cognitive status remained stable, with persistent forgetfulness but no delirium features noted.   Overall, vital signs and wound assessments remained stable, and no acute complications were documented during the shift.    Effective Breathing Pattern: Respiratory rate increased slightly over the shift, and breath sounds remained diminished with exceptions to WDL noted; NPPV was discontinued due to intolerance, but SpO2 levels remained stable in the mid-90s on room air.    Optimal Gas Exchange: Oxygen saturation stayed between 96-97% on room air, and venous blood gas showed O2 saturation at 74.1% with elevated HCO3 and base excess; no adverse reactions to incentive spirometry were documented.    Optimal Cognitive Function: Orientation and ability to follow commands were consistent throughout the shift, though forgetfulness persisted; CAM-ICU assessments remained negative for delirium features.    Maintenance of Heart Failure Symptom Control: Blood pressure and MAP increased slightly overnight, pulse fluctuated mildly, and cardiac rhythm remained atrial fibrillation with rare ectopy; no cardiac symptoms were reported and heart sounds were unchanged.    Absence of Infection Signs and Symptoms: ceFAZolin  was administered twice during the shift, and CBC showed low RBC, hematocrit, and platelets but normal WBC; skin assessments noted persistent bruising and sacral MASD, with no change in wound status or peri-wound appearance.

## 2024-03-17 NOTE — Unmapped (Signed)
 Shift Summary  ceFAZolin  in 50ml dextrose  was administered and then stopped during the shift.   Multiple medications were given in Department: 2 Bt1 Hbr early in the day.   Moisture barrier cream was applied and sacral wound was cleansed to support skin integrity.   Discharge planning identified environmental and safety barriers, with recommendations for high-intensity physical therapy.   Overall, assistance needs and wound care remained consistent, and cognition was stable with some forgetfulness.     Skin Health and Integrity: Moisture associated dermatitis at the sacrum and skin folds persisted, with continued use of moisture barrier cream and cleansing; bruising remained unchanged, and both sacral and left toe wounds appeared pink with no dressing applied, while pressure reduction techniques and specialty bed were utilized throughout the shift.     Readiness for Transition of Care: Barriers to discharge included an inaccessible home environment, impaired balance, and decreased safety awareness, with post-acute recommendations for high-intensity physical therapy and equipment deferred; patient and family did not state specific goals.     Improved Ability to Complete Activities of Daily Living: Moderate assistance was required for mobility and hygiene tasks, with minimal assistance needed for transfers and ambulation, and patient consistently able to feed self.     Optimal Cognitive Function: Cognition remained stable with forgetfulness and slow response noted, but orientation and command following were maintained, and CAM-ICU assessments remained negative for acute changes.     Improved Oral Intake: Aspiration precautions were maintained during oral intake, with upright posture and alertness ensured at each meal.

## 2024-03-17 NOTE — Unmapped (Signed)
 Problem: Adult Inpatient Plan of Care  Goal: Absence of Hospital-Acquired Illness or Injury  Outcome: Ongoing - Unchanged   Pt stable through night. Pt tried on bipap, pt couldn't tolerate and took mask off within 2 min. Will continue to monitor.

## 2024-03-17 NOTE — Unmapped (Signed)
 Geriatrics (MEDA) Progress Note    Assessment & Plan:   [name] is a 78 y.o. 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 Found to have acute hypercarbic respiratory failure, acute hepatic encephalopathy, and hypothermia following procedure, prompting intubation. Transferred to MICU for further management. She has since been extubated to nasal cannulated and weaned to room air. Able to ambulate with PT. Patient down graded to step down status and transferred to [facility] for further management.     Principal Problem:    Bacteremia, escherichia coli  Active Problems:    Alcoholic liver disease (HHS-HCC)    HTN (hypertension)    Depression with anxiety    Class 2 severe obesity with serious comorbidity in adult    Atrial fibrillation    (CMS-HCC)    Pleural effusion    Hypernatremia    Thrombocytopenia    Cirrhosis    (CMS-HCC)    A-fib (CMS-HCC)    Hepatic encephalopathy    (CMS-HCC)      Toxic Metabolic Encephalopathy, improving  Patient presented for scheduled TEE/DCCV with Cardiology on 10/10, received 50 mg propofol  and found to be difficult to wake following sedation with hypercarbic respiratory failure. Per chart review, initially poorly responsive to stimulation and started on NIPPV with BiPAP. Hypercarbia improved but remained altered eventually requiring intubation as below. Suspected likely multifactorial in the setting of hypercarbia, electrolyte abnormalities, HE, infection.    - CTH 10/10 wo acute intracranial process  - Successfully extubated 10/13.   - Delirium precautions  - Given no asterixis and mental status improving s/p extubation, discontinued lactulose      S/p Procedural Sedation c/b Acute on Chronic Hypoxic and Hypercarbic Respiratory Failure I  I Likely OSA    Presented with somnolence and difficulty to rouse s/p TEE/DCCV sedation as above. Transferred to CCU w/ pulm consult and placed on NIPPV. CXR with slight pulmonary edema s/p diuresis. Hypercarbia corrected on BiPAP then AVAPS from 86 > 58 but ultimately required intubation due to acute hypoxic respiratory failure from hypoventilation likely due to encephalopathy. Extubated to [place] on 10/13 and now stable on room air.   - BiPAP overnight  - VBG q24h     Sepsis 2/2 E. Coli Bacteremia  Afebrile upon arrival to scheduled procedure via axillary temp, but found to be hypothermic to 34.1C via foley catheter. Otherwise, HDS without leukocytosis. Hypothermia improved with Bair Hugger. Eventually sedated and intubated as above for acute hypoxic respiratory failure and encephalopathy. No fluid pocket for diagnostic paracentesis on POCUS. CXR with interstitial edema and bilateral pulmonary effusions but no focal consolidation. UA noninfectious. Blood cultures 10/10 found to be positive for E. Coli. Required levophed briefly but able to wean off since 10/12.  Initially started on Zosyn, now narrowed to Cefazolin  as below.  Unclear etiology though possible colonic source iso mass-like thickening involving the lower sigmoid colon.   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 No hematemesis. S/p  PPI, ceftriaxone, and octreotide for GIB PPX. Low suspicion for variceal bleeding given HD stability and stable hemoglobin. Suspect related to colonic mass. Has had some recurrent hematochezia since 10/13, however hemoglobin remains stable and GI recommends outpatient management.  - PO daily PPI  - On Ancef , resume CTX ppx after  - Daily CBC  - Colonoscopy outpatient for incidental finding concerning for sigmoid colon malignancy  - Per luminal GI 10/16: Not much need to procure CT GI bleed at this time as she is not actively bleeding.  They do think that the hematochezia is due to the mass.  At this time, recommending that we challenge with heparin again and monitor hemoglobin levels.  Plan to consult GI officially if bleeding begins.     Chronic HFpEF - HTN  On admission, pro-BNP 520, slightly elevated from 490 at previous discharge but improved from 846 on 02/29/24. CXR with interstitial edema and bilateral pulmonary effusions. GDMT held due to c/f acute infection but can consider restarting in coming days pending clinical improvement.  On physical exam, not demonstrating markers of volume overload at this time.  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 Started on Eliquis (cost-prohibitive due to not meeting deductible). Attempted TEE DCCV however aborted due to LAA thrombus. Unable to anticoagulate due to c/f GIB. Attempted to restart SQH ppx on 10/12-10/13, however patient had recurrent hematochezia and some light red secretions after extubation so all anticoagulation was held.  - HOLD Eliquis 5 mg BID in setting of GIB  - HOLD BB as above  - Consider heparin challenge 10/17  - Follow-up benign hematology as needed     MASLD Cirrhosis - Hx Hepatic Encephalopathy I Thrombocytopenia  History of decompensated metabolic-associated cirrhosis with HE, G1 EV and PHG on EGD 01/2018. Previously followed with hepatology but lost to follow up in 2020. Formerly prescribed lactulose  but has not been on it in several years. Presented altered and encephalopathic following sedation as above. Ammonia level elevated 192 -> 251 -> 82 from previous level of 30. Liver doppler US  negative for acute thrombosis. Had been receiving lactulose  enemas at OSH. Concern for development of GI bleed on 10/10 AM, GI consulted with low suspicion for variceal bleed, initially recommend placing NG tube for lactulose  administration to prevent further bleed. Most recently recommend stopping lactulose  given absence of asterixis and to avoid worsening electrolyte abnormalities and bowel urgency.   - Hepatology consulted, appreciate recommendations  - Discontinue Lactulose  via NG tube, goal 4-5x BM/day  - HOLD diuretics as above iso electrolyte abnormalities  - Daily MELD labs  - Follow up with GI outpatient    MELD 3.0: 14 at 03/17/2024  5:09 AM  MELD-Na: 11 at 03/17/2024  5:09 AM  Calculated from:  Serum Creatinine: 0.47 mg/dL (Using min of 1 mg/dL) at 89/83/7974  4:90 AM  Serum Sodium: 149 mmol/L (Using max of 137 mmol/L) at 03/17/2024  5:09 AM  Total Bilirubin: 1.4 mg/dL at 89/83/7974  4:90 AM  Serum Albumin: 2.3 g/dL at 89/83/7974  5:09 AM  INR(ratio): 1.37 at 03/17/2024  5:09 AM  Age at listing (hypothetical): 67 years  Sex: Female at 03/17/2024  5:09 AM    Hypernatremia  Suspected iso poor po intake. S/p post pyloric corpak placement. Previously discontinued D5 and adjusting FWF.    - Na level daily      Chronic Problems     Vertebral Compression Fracture  T12 vertebral compression fracture with retropulsion into the ventral spinal canal on CT. Has known history of osteoporosis identified on Dexa scan of femoral neck (11/2022 T score -2.9).      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  Behavioral Symptoms (baseline or now): No.     Mobility Baseline functional status: Independent in ADLs  Current functional status: Needs Assistance with ADLs  PT/OT Ordered: Recommend AIR   Medications Reviewed home medications, screening for potentially inappropriate medications: Yes  Medications recommended de-prescribing: n/a   What Matters Geri Assessment complete: No, needs to be done         Issues Impacting Complexity of Management:  -Intensive monitoring of drug toxicity from Zofran  with EKG to monitor QTc and cefazolin  with BMP  -High risk of complications from pain and/or analgesia likely to result in delirium  -The patient is at high risk from Hospital immobility in an elderly patient given baseline poor functional status with a high risk of causing delirium and further decline in function  -The patient is at high risk for the development of complications of volume overload due to the need to provide IV hydration for suspected hypovolemia in the setting of: heart failure and Cirrhosis  -Need for the following intensive monitoring parameter(s) due to high risk of clinical decline: frequent monitoring of urine output to assess for the efficacy of diuretic regimen  -The patient is at high risk of complications from delirium      Daily Checklist:  Diet: GI Softs  DVT PPx: Contraindicated - High Risk for Bleeding/Active Bleeding; however, patient is hypercoagulable  Electrolytes: Replete Potassium to >/=4 and Magnesium  to >/=2  Code Status: Full Code  Dispo: AIR    Team Contact Information:   Primary Team: Geriatrics (MEDA)  Primary Resident: Lacinda Rimes, MD  Resident's Pager: [phone] (Geriatrics Intern - [place])    Interval History:   No acute events overnight.    Patient is doing well today.  She is sitting in bed watching TV.  Was wide-awake on entering in the afternoon.  She was much more able to attend to conversation and was more participative today.  She is eating well, though the hospital food is not as appetizing as she would like.  Denies any nausea.  Able to stay on topic much more easily.    Objective:   Temp:  [35.7 ??C (96.2 ??F)-37 ??C (98.6 ??F)] 37 ??C (98.6 ??F)  Pulse:  [85-101] 99  SpO2 Pulse:  [84-97] 84  Resp:  [16-33] 17  BP: (107-147)/(62-90) 136/67  SpO2:  [96 %-97 %] 96 %    Gen: NAD  Eyes: sclera anicteric  HENT: MMM, OP w/o erythema or exudate   Heart: RRR, well-perfused  Lungs: CTAB, no crackles or wheezes anteriorly  Abdomen:  soft, NTND  Extremities: no clubbing, cyanosis, minimal edema in the right lower extremity, no edema in the left lower extremity  Psych: Alert&Ox4, attends to examiner    Labs/Studies: Labs and Studies from the last 24hrs per EMR and Reviewed    Lacinda Rimes, MD  PGY-1, [facility]

## 2024-03-18 LAB — BLOOD GAS CRITICAL CARE PANEL, VENOUS
BASE EXCESS VENOUS: 7.2 — ABNORMAL HIGH (ref -2.0–2.0)
CALCIUM IONIZED VENOUS (MG/DL): 4.69 mg/dL (ref 4.40–5.40)
GLUCOSE WHOLE BLOOD: 101 mg/dL (ref 54–400)
HCO3 VENOUS: 29 mmol/L — ABNORMAL HIGH (ref 22–27)
HEMOGLOBIN BLOOD GAS: 12 g/dL (ref 12.00–16.00)
LACTATE BLOOD VENOUS: 1.3 mmol/L (ref 0.5–1.8)
O2 SATURATION VENOUS: 62.2 % (ref 40.0–85.0)
PCO2 VENOUS: 51 mmHg (ref 40–60)
PH VENOUS: 7.4 (ref 7.32–7.43)
PO2 VENOUS: 34 mmHg — ABNORMAL LOW (ref 35–40)
POTASSIUM WHOLE BLOOD: 3.2 mmol/L — ABNORMAL LOW (ref 3.4–4.6)
SODIUM WHOLE BLOOD: 146 mmol/L — ABNORMAL HIGH (ref 135–145)

## 2024-03-18 LAB — COMPREHENSIVE METABOLIC PANEL
ALBUMIN: 2.5 g/dL — ABNORMAL LOW (ref 3.4–5.0)
ALKALINE PHOSPHATASE: 106 U/L (ref 46–116)
ALT (SGPT): 13 U/L (ref 10–49)
ANION GAP: 11 mmol/L (ref 5–14)
AST (SGOT): 32 U/L (ref <=34)
BILIRUBIN TOTAL: 1.5 mg/dL — ABNORMAL HIGH (ref 0.3–1.2)
BLOOD UREA NITROGEN: 8 mg/dL — ABNORMAL LOW (ref 9–23)
BUN / CREAT RATIO: 17
CALCIUM: 8.9 mg/dL (ref 8.7–10.4)
CHLORIDE: 107 mmol/L (ref 98–107)
CO2: 33.3 mmol/L — ABNORMAL HIGH (ref 20.0–31.0)
CREATININE: 0.47 mg/dL — ABNORMAL LOW (ref 0.55–1.02)
EGFR CKD-EPI (2021) FEMALE: >90 mL/min/1.73m2 (ref >=60)
GLUCOSE RANDOM: 105 mg/dL (ref 70–179)
POTASSIUM: 3.4 mmol/L (ref 3.4–4.8)
PROTEIN TOTAL: 5.5 g/dL — ABNORMAL LOW (ref 5.7–8.2)
SODIUM: 151 mmol/L — ABNORMAL HIGH (ref 135–145)

## 2024-03-18 LAB — PROTIME-INR
INR: 1.31
PROTIME: 14.9 s — ABNORMAL HIGH (ref 9.9–12.6)

## 2024-03-18 LAB — CBC W/ AUTO DIFF
BASOPHILS ABSOLUTE COUNT: 0 10*9/L (ref 0.0–0.1)
BASOPHILS RELATIVE PERCENT: 0.7 %
EOSINOPHILS ABSOLUTE COUNT: 0.3 10*9/L (ref 0.0–0.5)
EOSINOPHILS RELATIVE PERCENT: 5.6 %
HEMATOCRIT: 35.4 % (ref 34.0–44.0)
HEMOGLOBIN: 12 g/dL (ref 11.3–14.9)
LYMPHOCYTES ABSOLUTE COUNT: 1.1 10*9/L (ref 1.1–3.6)
LYMPHOCYTES RELATIVE PERCENT: 21.7 %
MEAN CORPUSCULAR HEMOGLOBIN CONC: 33.9 g/dL (ref 32.0–36.0)
MEAN CORPUSCULAR HEMOGLOBIN: 33.3 pg — ABNORMAL HIGH (ref 25.9–32.4)
MEAN CORPUSCULAR VOLUME: 98.3 fL — ABNORMAL HIGH (ref 77.6–95.7)
MEAN PLATELET VOLUME: 8 fL (ref 6.8–10.7)
MONOCYTES ABSOLUTE COUNT: 0.5 10*9/L (ref 0.3–0.8)
MONOCYTES RELATIVE PERCENT: 9.8 %
NEUTROPHILS ABSOLUTE COUNT: 3.1 10*9/L (ref 1.8–7.8)
NEUTROPHILS RELATIVE PERCENT: 62.2 %
NUCLEATED RED BLOOD CELLS: 0 /100{WBCs} (ref <=4)
PLATELET COUNT: 105 10*9/L — ABNORMAL LOW (ref 150–450)
RED BLOOD CELL COUNT: 3.61 10*12/L — ABNORMAL LOW (ref 3.95–5.13)
RED CELL DISTRIBUTION WIDTH: 16.4 % — ABNORMAL HIGH (ref 12.2–15.2)
WBC ADJUSTED: 5 10*9/L (ref 3.6–11.2)

## 2024-03-18 LAB — PHOSPHORUS: PHOSPHORUS: 2.3 mg/dL — ABNORMAL LOW (ref 2.4–5.1)

## 2024-03-18 LAB — MAGNESIUM: MAGNESIUM: 1.9 mg/dL (ref 1.6–2.6)

## 2024-03-18 MED ADMIN — dextrose 5 % bolus 500 mL: 500 mL | INTRAVENOUS | @ 15:00:00 | Stop: 2024-03-18

## 2024-03-18 MED ADMIN — magnesium oxide (MAG-OX) tablet 400 mg: 400 mg | ORAL | @ 02:00:00

## 2024-03-18 MED ADMIN — ceFAZolin (ANCEF) IVPB 2 g in 50 ml dextrose (premix): 2 g | INTRAVENOUS | @ 02:00:00 | Stop: 2024-03-17

## 2024-03-18 MED ADMIN — multivitamins, therapeutic with minerals tablet 1 tablet: 1 | ORAL | @ 13:00:00

## 2024-03-18 MED ADMIN — cyanocobalamin (vitamin B-12) tablet 1,000 mcg: 1000 ug | ORAL | @ 13:00:00

## 2024-03-18 MED ADMIN — heparin (porcine) 5,000 unit/mL injection 5,000 Units: 5000 [IU] | SUBCUTANEOUS | @ 19:00:00

## 2024-03-18 MED ADMIN — anastrozole (ARIMIDEX) tablet 1 mg: 1 mg | ORAL | @ 13:00:00

## 2024-03-18 MED ADMIN — folic acid (FOLVITE) tablet 1 mg: 1 mg | ORAL | @ 13:00:00

## 2024-03-18 MED ADMIN — carvedilol (COREG) tablet 3.125 mg: 3.125 mg | ORAL | @ 13:00:00

## 2024-03-18 MED ADMIN — thiamine mononitrate (vit B1) tablet 100 mg: 100 mg | ORAL | @ 13:00:00

## 2024-03-18 MED ADMIN — carvedilol (COREG) tablet 3.125 mg: 3.125 mg | ORAL | @ 02:00:00

## 2024-03-18 MED ADMIN — amlodipine (NORVASC) tablet 5 mg: 5 mg | ORAL | @ 13:00:00

## 2024-03-18 MED ADMIN — magnesium oxide (MAG-OX) tablet 400 mg: 400 mg | ORAL | @ 13:00:00

## 2024-03-18 MED ADMIN — spironolactone (ALDACTONE) tablet 25 mg: 25 mg | ORAL | @ 19:00:00

## 2024-03-18 MED ADMIN — pantoprazole (Protonix) EC tablet 40 mg: 40 mg | ORAL | @ 13:00:00

## 2024-03-18 NOTE — Unmapped (Signed)
 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 Cardiology was consulted for management of LAA thrombus in setting of hematochezia.    #LAA Thrombus  #pAF  Rate adequately controlled. TEE 03/10/24 showed presence of LAA thrombus. She is at high risk for stroke or other embolic event and would benefit from being on therapeutic anticoagulation.   - Agree with primary team's plan to challenge with heparin subcutaneous and then drip. If she develops hematochezia, she would benefit from inpatient workup and management of colonic mass or other source of bleeding.  - Carvedilol  for rate control, can increase dose as needed for goal HR <110  - Outpatient repeat TEE to assess for resolution of LAA thrombus. If she is safe to be on [facility], she would benefit from DCCV to restore sinus rhythm     #HFpEF  #HTN  TTE 03/01/24 EF >55%, mild MR. Home GDMT was held on admission due to sepsis and hypotension.  - Recommend initiating low dose ARB, MRA, SGLT2 inhibitor as patient tolerates, can likely discontinue amlodipine  to allow room in BP for uptitration of GDMT     History of Present Illness:     24F PMHx pAF on Eliquis, HFpEF, HTN, MASH cirrhosis c/b HE, breast cancer s/p partial mastectomy and XRT on anastrozole  initially presented for TEE/DCCV but was found with LAA thrombus also with E coli bacteremia, new sigmoid mass, hematochezia. Cardiology was consulted for management of LAA thrombus in setting of hematochezia.    Patient had hematochezia earlier in the admission and home apixaban was held. She reports daily BM's without bleeding in the past few days. She has not had dizziness, CP, SOB, or palpitations.    Allergies[1]    Medications:   Prior to Admission medications   Medication Sig Start Date End Date Taking? Authorizing Provider   anastrozole  (ARIMIDEX ) 1 mg tablet Take 1 tablet (1 mg total) by mouth daily. 09/17/23  Yes Myer Reyes BIRCH, MD   apixaban (ELIQUIS) 5 mg Tab Take 1 tablet (5 mg total) by mouth two (2) times a day. 03/04/24  Yes [place]-Dixon, Almarie SAUNDERS, MD   carvedilol  (COREG ) 3.125 MG tablet Take 1 tablet (3.125 mg total) by mouth two (2) times a day. 08/06/23 08/05/24 Yes Alyse Slater Pao, MD   folic acid  (FOLVITE ) 1 MG tablet Take 1 tablet (1 mg total) by mouth daily.   Yes [provider]   furosemide (LASIX) 40 MG tablet Take 1 tablet (40 mg total) by mouth daily as needed. 03/08/24 03/08/25 Yes Max, Marshia Carbo, CPP   magnesium  oxide (MAG-OX) 400 mg (241.3 mg elemental magnesium ) tablet Take 1 tablet (400 mg total) by mouth two (2) times a day. 05/29/23 05/28/24 Yes Derenda Rockers, MD   potassium chloride  20 MEQ ER tablet Take 1 tablet (20 mEq total) by mouth two (2) times a day. 03/04/24  Yes [place], Almarie SAUNDERS, MD   spironolactone (ALDACTONE) 25 MG tablet Take 1 tablet (25 mg total) by mouth daily. 03/08/24  Yes Max, Marshia Carbo, CPP   thiamine  (B-1) 100 MG tablet Take 1 tablet (100 mg total) by mouth  daily.   Yes [provider]   vitamin E-268 mg, 400 UNIT, 268 mg (400 UNIT) capsule Take 800 mg by mouth in the morning. 2 tabs.   Yes [provider]   acetaminophen  (TYLENOL ) 325 MG tablet Take 2 tablets (650 mg total) by mouth every six (6) hours as needed for pain. 06/05/17   Katherleen Fairy Bring, MD   [Paused] alendronate  (FOSAMAX ) 70 MG tablet Take 1 tablet (70 mg total) by mouth every seven (7) days.  Patient not taking: Reported on 03/07/2024  Wait to take this until your doctor or other care provider tells you to start again. 09/17/23   Myer Reyes BIRCH, MD   calcium carbonate (OS-CAL) 1,250 mg (500 mg elem calcium) tablet Take 1 tablet (500 mg elem calcium total) by mouth in the morning.  Patient not taking: Reported on 03/07/2024    [provider]   cyanocobalamin 1000 MCG tablet Take 1 tablet (1,000 mcg total) by mouth daily.    [provider]   [Paused] nystatin  (MYCOSTATIN ) 100,000 unit/gram powder Apply to affected area 3 times daily  Wait to take this until your doctor or other care provider tells you to start again. 03/05/23 03/07/24  Quin [name], FNP   empagliflozin (JARDIANCE) 10 mg tablet Take 1 tablet (10 mg total) by mouth daily. For benefit inquiry only. Please send Epic chat message to [facility] with coverage information, then discontinue Rx. 03/02/24 03/02/24  [place]-Dixon, Almarie SAUNDERS, MD       Past Medical History[2]    Past Surgical History[3]    Social History:  Tobacco Use History[4]  Social History     Substance and Sexual Activity   Alcohol Use Not Currently     Social History     Substance and Sexual Activity   Drug Use Never       Family History[5]    Code Status:  Full Code    Review of Systems: All positive and pertinent negatives are noted in the HPI; otherwise all other systems are negative.    Objective:     Physical Exam:  Temp:  [35.6 ??C (96.1 ??F)-37 ??C (98.6 ??F)] 35.6 ??C (96.1 ??F)  Pulse:  [85-99] 94  SpO2 Pulse:  [84] 84  Resp:  [16-30] 18  BP: (107-146)/(67-90) 145/73  SpO2:  [92 %-97 %] 94 %  General:  Resting comfortably in NAD  Neuro:  Alert & oriented X 3.  Follows commands  Chest:  Resp even and nonlabored.  Lungs sounds clear throughout  CV: Irregular, no significant murmurs  Ext:  No clubbing, cyanosis, trace edema  MSK:  Moving all extremities.  No obvious limitations or significant deficits    Labs:   Lab Results   Component Value Date    TROPONINI 3 02/29/2024    TROPONINI 3 02/29/2024    TROPONINI 3 02/29/2024     Recent Labs     Units 03/18/24  0744   WBC 10*9/L 5.0   RBC 10*12/L 3.61*   HGB g/dL 87.9   HCT % 64.5   MCV fL 98.3*   MCH pg 33.3*   MCHC g/dL 66.0   RDW % 83.5*   PLT 10*9/L 105*   MPV fL 8.0   .  Recent Labs     Units 03/18/24  0744   NA mmol/L 151* - 146*   K mmol/L 3.4 - 3.2*   CL mmol/L 107   BUN mg/dL 8*   CREATININE mg/dL 9.52*  GLU mg/dL 894     Lab Results   Component Value Date    Cholesterol, LDL, Calculated 60 02/29/2024    Cholesterol, Non-HDL, Calculated 68 02/29/2024    Cholesterol, HDL 54 02/29/2024    INR 1.31 03/18/2024       Earla SHAUNNA Currier, MD  03/18/2024, 9:43 AM         [1]   Allergies  Allergen Reactions    Sulfa (Sulfonamide Antibiotics) Anaphylaxis    Sulfur  Anaphylaxis     Tolerated sulfur  colloid injection without incident 09/03/2020.    Aspirin      thrombocytopenia   [2]   Past Medical History:  Diagnosis Date    A-fib (CMS-HCC)     Alcoholism    (CMS-HCC)     Alcoholism /alcohol abuse     Cirrhosis    (CMS-HCC)     Depression     Hypertension     Liver disease     Malignant neoplasm of overlapping sites of right breast in female, estrogen receptor positive    (CMS-HCC) 10/03/2020   [3]   Past Surgical History:  Procedure Laterality Date    BREAST BIOPSY Right     benign-a long time ago    BREAST BIOPSY Right 07/2020    malignant    BREAST LUMPECTOMY Right     4 2022    CHOLECYSTECTOMY      PR BX/REMV,LYMPH NODE,DEEP AXILL Right 09/03/2020    Procedure: BX/EXC LYMPH NODE; OPEN, DEEP AXILRY NODE;  Surgeon: Alm Elsie Como, MD;  Location: [facility] OR [facility];  Service: Surgical Oncology Breast    PR CARDIOVERSION, ELECTIVE;EXTERN N/A 03/10/2024    Procedure: CARDIOVERSION, ELECTIVE, ELECTRICAL CONVERSION OF ARRHYTHMIA; EXTERNAL;  Surgeon: Sedalia Velma Hamilton, MD;  Location: [facility] OR [facility];  Service: Cardiology    PR ECHO HEART,TRANSESOPHAGEAL,COMPLETE Midline 03/10/2024    Procedure: ECHOCARDIOGRAPHY, TRANSESOPHAGEAL, REAL-TIME WITH IMAGE DOCUMENTATION;  Surgeon: Sedalia Velma Hamilton, MD;  [facility];  Service: Cardiology    PR INTRAOPERATIVE SENTINEL LYMPH NODE ID W DYE INJECTION Right 09/03/2020    Procedure: INTRAOPERATIVE IDENTIFICATION SENTINEL LYMPH NODE(S) INCLUDE INJECTION NON-RADIOACTIVE DYE, WHEN PERFORMED;  Surgeon: Alm Elsie Como, MD;  Location: [facility];  Service: Surgical Oncology Breast    PR MASTECTOMY, PARTIAL Right 09/03/2020    Procedure: MASTECTOMY, PARTIAL (EG, LUMPECTOMY, TYLECTOMY, QUADRANTECTOMY, SEGMENTECTOMY);  Surgeon: Alm Elsie Como, MD;  [facility];  Service: Surgical Oncology Breast    PR UPPER GI ENDOSCOPY,BIOPSY N/A 02/03/2018    Procedure: UGI ENDOSCOPY; WITH BIOPSY, SINGLE OR MULTIPLE;  Surgeon: Eleanor Dewey Sorrel, MD;  Location: [facility] PROCED[facility];  Service: Gastroenterology    RADIATION Right     unsure finish date    TUBAL LIGATION     [4]   Social History  Tobacco Use   Smoking Status Never    Passive exposure: Past   Smokeless Tobacco Never   [5]   Family History  Problem Relation Age of Onset    Heart disease Mother     No Known Problems Father     No Known Problems Sister     No Known Problems Daughter     No Known Problems Maternal Grandmother     No Known Problems Maternal Grandfather     No Known Problems Paternal Grandmother     No Known Problems Paternal Grandfather     Lupus Brother     No Known Problems Other  BRCA 1/2 Neg Hx     Breast cancer Neg Hx     Cancer Neg Hx     Colon cancer Neg Hx     Endometrial cancer Neg Hx     Ovarian cancer Neg Hx     Mental illness Neg Hx     Substance Abuse Disorder Neg Hx

## 2024-03-18 NOTE — Unmapped (Signed)
 Pt is alert and oriented this shift. Dressing changed to RIJ. I assist to [facility]. Verbalizes needs.

## 2024-03-18 NOTE — Unmapped (Addendum)
 Shift Summary  ceFAZolin  in 50ml dextrose  was administered and then stopped during the shift.  Triple lumen intact.  Bedrest and use of assistive devices were maintained, with consistent support for hygiene and bathing needs.  Safety interventions including bed alarm, side rails, and fall reduction program were consistently in place, with family present at bedside later in the shift.  Skin protection and pressure reduction techniques were maintained throughout the shift.  Overall, interventions for comfort, safety, and skin integrity were consistently applied and no adverse events were documented during the shift.    Skin Health and Integrity: Skin protection measures such as incontinence pads and frequent weight shifts were maintained throughout the shift, with heels elevated off the bed at one point; no changes in skin status were documented.    Absence of Hospital-Acquired Illness or Injury: ceFAZolin  in 50ml dextrose  was administered and then stopped during the shift; no new hospital-acquired injuries were documented.    Optimal Comfort and Wellbeing: Bedrest was maintained and pillows were in use for positioning, with moderate assistance provided for hygiene and bathing needs; comfort interventions were consistent throughout the shift.    Improved Ability to Complete Activities of Daily Living: Able to feed self consistently, but required moderate assistance for hygiene and bathing, with no change in level of independence documented.    Absence of Fall and Fall-Related Injury: Bed alarm, fall reduction program, and safety devices were consistently utilized, with hourly visual checks and side rails up at patient request; no fall or injury events were documented.

## 2024-03-18 NOTE — Unmapped (Signed)
 Geriatrics (MED[facility]) Progress Note    Assessment & Plan:   [name] is a 78 y.o. 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 Found to have acute hypercarbic respiratory failure, acute hepatic encephalopathy, and hypothermia following procedure, prompting intubation. Transferred to MICU for further management. She has since been extubated to nasal cannulated and weaned to room air. Able to ambulate with PT. Patient down graded to step down status and transferred to [facility] for further management.     Principal Problem:    Bacteremia, escherichia coli  Active Problems:    Alcoholic liver disease (HHS-HCC)    HTN (hypertension)    Depression with anxiety    Class 2 severe obesity with serious comorbidity in adult    Atrial fibrillation    (CMS-HCC)    Pleural effusion    Hypernatremia    Thrombocytopenia    Cirrhosis    (CMS-HCC)    A-fib (CMS-HCC)    Hepatic encephalopathy    (CMS-HCC)      Toxic Metabolic Encephalopathy, improving  Patient presented for scheduled TEE/DCCV with Cardiology on 10/10, received 50 mg propofol  and found to be difficult to wake following sedation with hypercarbic respiratory failure. Per chart review, initially poorly responsive to stimulation and started on NIPPV with BiPAP. Hypercarbia improved but remained altered eventually requiring intubation as below. Suspected likely multifactorial in the setting of hypercarbia, electrolyte abnormalities, HE, infection. She is now improving and close to her baseline.  - CTH 10/10 wo acute intracranial process  - Successfully extubated 10/13.   - Delirium precautions  - Given no asterixis and mental status improving s/p extubation, discontinued lactulose      S/p Procedural Sedation c/b Acute on Chronic Hypoxic and Hypercarbic Respiratory Failure I  I Likely OSA    Presented with somnolence and difficulty to rouse s/p TEE/DCCV sedation as above. Transferred to CCU w/ pulm consult and placed on NIPPV. CXR with slight pulmonary edema s/p diuresis. Hypercarbia corrected on BiPAP then AVAPS from 86 > 58 but ultimately required intubation due to acute hypoxic respiratory failure from hypoventilation likely due to encephalopathy. Extubated to [place] on 10/13 and now stable on room air. Has been doing well on room air without need for additional oxygen supplementation overnight.   - Continuous pulse ox monitoring     Sepsis 2/2 E. Coli Bacteremia  Afebrile upon arrival to scheduled procedure via axillary temp, but found to be hypothermic to 34.1C via foley catheter. Otherwise, HDS without leukocytosis. Hypothermia improved with Bair Hugger. Eventually sedated and intubated as above for acute hypoxic respiratory failure and encephalopathy. No fluid pocket for diagnostic paracentesis on POCUS. CXR with interstitial edema and bilateral pulmonary effusions but no focal consolidation. UA noninfectious. Blood cultures 10/10 found to be positive for E. Coli. Required levophed briefly but able to wean off since 10/12.  Initially started on Zosyn, now narrowed to Cefazolin  as below.  Unclear etiology though possible colonic source iso mass-like thickening involving the lower sigmoid colon. Now has completed course of Cefazolin .  - S/p Zosyn (10/10-10/12) and Cefazolin  (10/12 - 10/18)  - 10/10 blood cultures + pan-sensitive E.coli  - Follow up repeat 10/12 blood cultures    Atrial Fibrillation - Left Atrial Appendage Thrombus (03/10/24)  New onset (03/2024), continued  on home Coreg  3.125 mg BID. Started on Eliquis (cost-prohibitive due to not meeting deductible). Attempted TEE DCCV however aborted due to LAA thrombus. Have been unable to anticoagulate due to c/f GIB. Attempted to restart SQH ppx on 10/12-10/13, however patient had recurrent hematochezia and some light red secretions after extubation so all anticoagulation was held. Cardiology consulted and assisting with management. Will trial SQH again today and if patient able to tolerate, will plan to trial heparin gtt.  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 No hematemesis. S/p PPI, ceftriaxone, and octreotide for GIB PPX. Low suspicion for variceal bleeding given HD stability and stable hemoglobin. Suspect related to colonic mass. Had recurrent hematochezia on 10/13 in the s/o challenge of subQ heparin, however has resolved since this was stopped. Per luminal GI 10/16, do not need CT GI bleed at this time as she is not actively bleeding. Plan to consult GI officially if bleeding begins. Hemoglobin has remained stable. Current plan is for outpatient colonoscopy for further workup, however may need to consider inpatient pending how current trial of subQ heparin goes.  - PO daily PPI  - On Ancef , resume CTX ppx after  - Daily CBC  - Colonoscopy outpatient for incidental finding concerning for sigmoid colon malignancy     Chronic HFpEF - HTN  On admission, pro-BNP 520, slightly elevated from 490 at previous discharge but improved from 846 on 02/29/24. CXR with interstitial edema and bilateral pulmonary effusions. GDMT held due to c/f acute infection but can consider restarting in coming days pending clinical improvement.  On physical exam, not demonstrating markers of volume overload at this time.  - Home GDMT in setting of acute infection:  - BB: Coreg  3.125 mg BID  - ARB/ACEi/ARNI: hold iso hypotension/sepsis  - MRA: Hold Aldactone 25 mg daily  - SGLT2i: Hold Farxiga 47$/month - consider as outpatient  - HOLD Diuretics  - Daily Weights, EDW 195 lbs  - Strict I/Os  - Goal K >4, Mg >2  - Daily BMP, Mg     MASLD Cirrhosis - Hx Hepatic Encephalopathy I Thrombocytopenia  History of decompensated metabolic-associated cirrhosis with HE, G1 EV and PHG on EGD 01/2018. Previously followed with hepatology but lost to follow up in 2020. Formerly prescribed lactulose  but has not been on it in several years. Presented altered and encephalopathic following sedation as above. Ammonia level elevated 192 -> 251 -> 82 from previous level of 30. Liver doppler US  negative for acute thrombosis. Had been receiving lactulose  enemas at OSH. Concern for development of GI bleed on 10/10 AM, GI consulted with low suspicion for variceal bleed, initially recommend placing NG tube for lactulose  administration to prevent further bleed. Most recently recommend stopping lactulose  given absence of asterixis and to avoid worsening electrolyte abnormalities and bowel urgency.   - Hepatology consulted, appreciate recommendations  - Discontinue Lactulose  via NG tube, goal 4-5x BM/day  - HOLD diuretics as above iso electrolyte abnormalities  - Daily MELD labs  - Follow up with GI outpatient    MELD 3.0: 14 at 03/17/2024  5:09 AM  MELD-Na: 11  at 03/17/2024  5:09 AM  Calculated from:  Serum Creatinine: 0.47 mg/dL (Using min of 1 mg/dL) at 89/83/7974  4:90 AM  Serum Sodium: 149 mmol/L (Using max of 137 mmol/L) at 03/17/2024  5:09 AM  Total Bilirubin: 1.4 mg/dL at 89/83/7974  4:90 AM  Serum Albumin: 2.3 g/dL at 89/83/7974  4:90 AM  INR(ratio): 1.37 at 03/17/2024  5:09 AM  Age at listing (hypothetical): 78 years  Sex: Female at 03/17/2024  5:09 AM    Hypernatremia  Suspected iso poor po intake. S/p post pyloric corpak placement. Previously discontinued D5 and adjusting FWF.    - Na level daily    Chronic Problems     Vertebral Compression Fracture  T12 vertebral compression fracture with retropulsion into the ventral spinal canal on CT. Has known history of osteoporosis identified on Dexa scan of femoral neck (11/2022 T score -2.9).      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)  Continued anastrozole  1 mg daily        Mentation CAM: Overall CAM-ICU: Negative  6CIT:    Not yet completed (please touch base with nursing)  Delirium Order Set: Ordered (appropriate for any patient >65)  Dementia: No.  Behavioral Symptoms (baseline or now): No.     Mobility Baseline functional status: Independent in ADLs  Current functional status: Needs Assistance with ADLs  PT/OT Ordered: Recommend AIR   Medications Reviewed home medications, screening for potentially inappropriate medications: Yes  Medications recommended de-prescribing: n/a   What Matters Geri Assessment complete: No, needs to be done         Issues Impacting Complexity of Management:  -Intensive monitoring of drug toxicity from Zofran  with EKG to monitor QTc and cefazolin  with BMP  -High risk of complications from pain and/or analgesia likely to result in delirium  -The patient is at high risk from Hospital immobility in an elderly patient given baseline poor functional status with a high risk of causing delirium and further decline in function  -The patient is at high risk for the development of complications of volume overload due to the need to provide IV hydration for suspected hypovolemia in the setting of: heart failure and Cirrhosis  -Need for the following intensive monitoring parameter(s) due to high risk of clinical decline: frequent monitoring of urine output to assess for the efficacy of diuretic regimen  -The patient is at high risk of complications from delirium      Daily Checklist:  Diet: GI Softs  DVT PPx: Contraindicated - High Risk for Bleeding/Active Bleeding; however, patient is hypercoagulable  Electrolytes: Replete Potassium to >/=4 and Magnesium  to >/=2  Code Status: Full Code  Dispo: AIR    Team Contact Information:   Primary Team: Geriatrics ([facility])  Primary Resident: Tinnie DELENA Carbine, MD  Resident's Pager: [phone] (Geriatrics Intern - [place])    Interval History:   No acute events overnight. Patient doing well this morning. Ate pancakes for breakfast. No further hematochezia. She is conversant and interactive, able to follow conversation.     Objective:   Temp:  [35.7 ??C (96.2 ??F)-37 ??C (98.6 ??F)] 36.2 ??C (97.2 ??F)  Pulse:  [85-99] 94  SpO2 Pulse:  [84] 84  Resp:  [16-30] 18  BP: (107-147)/(67-90) 144/83  SpO2:  [92 %-97 %] 92 %    Gen: NAD  Eyes: sclera anicteric  HENT: MMM, OP w/o erythema or exudate   Heart: RRR, well-perfused  Lungs: CTAB, no  crackles or wheezes anteriorly  Abdomen: soft, NTND  Extremities: no clubbing, cyanosis, minimal edema in the right lower extremity, no edema in the left lower extremity  Psych: Alert&Ox4, attends to examiner    Labs/Studies: Labs and Studies from the last 24hrs per EMR and Reviewed    Tinnie DELENA Carbine, MD  Internal Medicine/Pediatrics  PGY-3

## 2024-03-18 NOTE — Unmapped (Signed)
 Adult Nutrition Progress Note    Visit Type: Follow-Up  Reason for Visit: Enteral Nutrition, PO Intake    Patient advanced to diet and tube feeds stopped 10/15. Patient reports eating breakfast yesterday but was not hungry for lunch and dinner. Had eggs and biscuit with gravy for breakfast yesterday. Patient reports having pancakes this morning, CNA reports patient consumed 100% meal this morning. In PT during brief visit, encouraged patient to eat 2-3 meals daily. Patient will likely progress in PO intake. Can supplement with Ensure Plus High Protein with meals PRN    Follow-Up Parameters:   1-2 times per week (and more frequent as indicated)    Arleta Blanch, RD, LDN

## 2024-03-19 LAB — COMPREHENSIVE METABOLIC PANEL
ALBUMIN: 2.4 g/dL — ABNORMAL LOW (ref 3.4–5.0)
ALKALINE PHOSPHATASE: 101 U/L (ref 46–116)
ALT (SGPT): 11 U/L (ref 10–49)
ANION GAP: 9 mmol/L (ref 5–14)
AST (SGOT): 29 U/L (ref <=34)
BILIRUBIN TOTAL: 1.5 mg/dL — ABNORMAL HIGH (ref 0.3–1.2)
BLOOD UREA NITROGEN: 7 mg/dL — ABNORMAL LOW (ref 9–23)
BUN / CREAT RATIO: 15
CALCIUM: 8.7 mg/dL (ref 8.7–10.4)
CHLORIDE: 107 mmol/L (ref 98–107)
CO2: 34 mmol/L — ABNORMAL HIGH (ref 20.0–31.0)
CREATININE: 0.46 mg/dL — ABNORMAL LOW (ref 0.55–1.02)
EGFR CKD-EPI (2021) FEMALE: >90 mL/min/1.73m2 (ref >=60)
GLUCOSE RANDOM: 90 mg/dL (ref 70–179)
POTASSIUM: 3.3 mmol/L — ABNORMAL LOW (ref 3.4–4.8)
PROTEIN TOTAL: 5.3 g/dL — ABNORMAL LOW (ref 5.7–8.2)
SODIUM: 150 mmol/L — ABNORMAL HIGH (ref 135–145)

## 2024-03-19 LAB — CBC W/ AUTO DIFF
BASOPHILS ABSOLUTE COUNT: 0 10*9/L (ref 0.0–0.1)
BASOPHILS RELATIVE PERCENT: 0.8 %
EOSINOPHILS ABSOLUTE COUNT: 0.3 10*9/L (ref 0.0–0.5)
EOSINOPHILS RELATIVE PERCENT: 6.8 %
HEMATOCRIT: 33.8 % — ABNORMAL LOW (ref 34.0–44.0)
HEMOGLOBIN: 11.5 g/dL (ref 11.3–14.9)
LYMPHOCYTES ABSOLUTE COUNT: 1 10*9/L — ABNORMAL LOW (ref 1.1–3.6)
LYMPHOCYTES RELATIVE PERCENT: 22.6 %
MEAN CORPUSCULAR HEMOGLOBIN CONC: 34.1 g/dL (ref 32.0–36.0)
MEAN CORPUSCULAR HEMOGLOBIN: 33.6 pg — ABNORMAL HIGH (ref 25.9–32.4)
MEAN CORPUSCULAR VOLUME: 98.5 fL — ABNORMAL HIGH (ref 77.6–95.7)
MEAN PLATELET VOLUME: 7.8 fL (ref 6.8–10.7)
MONOCYTES ABSOLUTE COUNT: 0.5 10*9/L (ref 0.3–0.8)
MONOCYTES RELATIVE PERCENT: 11.5 %
NEUTROPHILS ABSOLUTE COUNT: 2.6 10*9/L (ref 1.8–7.8)
NEUTROPHILS RELATIVE PERCENT: 58.3 %
NUCLEATED RED BLOOD CELLS: 0 /100{WBCs} (ref <=4)
PLATELET COUNT: 102 10*9/L — ABNORMAL LOW (ref 150–450)
RED BLOOD CELL COUNT: 3.43 10*12/L — ABNORMAL LOW (ref 3.95–5.13)
RED CELL DISTRIBUTION WIDTH: 17.6 % — ABNORMAL HIGH (ref 12.2–15.2)
WBC ADJUSTED: 4.4 10*9/L (ref 3.6–11.2)

## 2024-03-19 LAB — PROTIME-INR
INR: 1.31
PROTIME: 14.9 s — ABNORMAL HIGH (ref 9.9–12.6)

## 2024-03-19 LAB — APTT
APTT: 304 s (ref 24.8–38.4)
HEPARIN CORRELATION: 1.7

## 2024-03-19 LAB — MAGNESIUM: MAGNESIUM: 2 mg/dL (ref 1.6–2.6)

## 2024-03-19 LAB — PHOSPHORUS: PHOSPHORUS: 2.3 mg/dL — ABNORMAL LOW (ref 2.4–5.1)

## 2024-03-19 MED ADMIN — potassium phosphate (monobasic) (K-PHOS) tablet 1,000 mg: 1000 mg | ORAL | @ 16:00:00 | Stop: 2024-03-19

## 2024-03-19 MED ADMIN — empagliflozin (JARDIANCE) tablet 10 mg: 10 mg | ORAL | @ 14:00:00

## 2024-03-19 MED ADMIN — spironolactone (ALDACTONE) tablet 25 mg: 25 mg | ORAL | @ 13:00:00

## 2024-03-19 MED ADMIN — potassium phosphate (monobasic) (K-PHOS) tablet 1,000 mg: 1000 mg | ORAL | @ 22:00:00 | Stop: 2024-03-19

## 2024-03-19 MED ADMIN — magnesium oxide (MAG-OX) tablet 400 mg: 400 mg | ORAL | @ 01:00:00

## 2024-03-19 MED ADMIN — heparin (porcine) 5,000 unit/mL injection 5,000 Units: 5000 [IU] | SUBCUTANEOUS | @ 10:00:00 | Stop: 2024-03-19

## 2024-03-19 MED ADMIN — carvedilol (COREG) tablet 3.125 mg: 3.125 mg | ORAL | @ 01:00:00

## 2024-03-19 MED ADMIN — carvedilol (COREG) tablet 3.125 mg: 3.125 mg | ORAL | @ 13:00:00

## 2024-03-19 MED ADMIN — pantoprazole (Protonix) EC tablet 40 mg: 40 mg | ORAL | @ 13:00:00

## 2024-03-19 MED ADMIN — thiamine mononitrate (vit B1) tablet 100 mg: 100 mg | ORAL | @ 13:00:00

## 2024-03-19 MED ADMIN — cyanocobalamin (vitamin B-12) tablet 1,000 mcg: 1000 ug | ORAL | @ 13:00:00

## 2024-03-19 MED ADMIN — magnesium oxide (MAG-OX) tablet 400 mg: 400 mg | ORAL | @ 13:00:00

## 2024-03-19 MED ADMIN — anastrozole (ARIMIDEX) tablet 1 mg: 1 mg | ORAL | @ 13:00:00

## 2024-03-19 MED ADMIN — valsartan (DIOVAN) tablet 20 mg: 20 mg | ORAL | @ 14:00:00

## 2024-03-19 MED ADMIN — acetaminophen (TYLENOL) tablet 650 mg: 650 mg | ORAL | @ 16:00:00

## 2024-03-19 MED ADMIN — multivitamins, therapeutic with minerals tablet 1 tablet: 1 | ORAL | @ 13:00:00

## 2024-03-19 MED ADMIN — heparin 25,000 Units/250 mL (100 units/mL) in 0.45% saline infusion (premade): 0-24 [IU]/kg/h | INTRAVENOUS | @ 14:00:00

## 2024-03-19 MED ADMIN — folic acid (FOLVITE) tablet 1 mg: 1 mg | ORAL | @ 13:00:00

## 2024-03-19 MED ADMIN — heparin (porcine) 5,000 unit/mL injection 5,000 Units: 5000 [IU] | SUBCUTANEOUS | @ 01:00:00

## 2024-03-19 NOTE — Unmapped (Addendum)
 Shift Summary  Bed and chair alarms were consistently activated and safety interventions were maintained throughout the shift.   Regular repositioning and scheduled toileting were performed to support skin integrity and fall prevention.   Pain remained well controlled with no reported discomfort.   Laboratory results were received and reviewed near the end of the shift.   Patient remained safe and free from falls or new injuries during the shift.  RIJ intact.  All needs voiced and met.     Skin Health and Integrity: Skin integrity maintained throughout the shift with regular repositioning and no impairment noted on Braden assessment; peripheral IV site remained clean, dry, and intact with no intervention needed.     Absence of Hospital-Acquired Illness or Injury: No new hospital-acquired injuries documented; safety interventions and bed/chair alarms were consistently maintained.     Optimal Comfort and Wellbeing: Pain remained at 0 during the shift and no comfort interventions were required.     Improved Ability to Complete Activities of Daily Living: Mobility was slightly limited and moderate assistance was required for activity, with use of a front wheel walker; patient remained chairfast and on bedrest.     Absence of Fall and Fall-Related Injury: Fall prevention strategies were maintained with hourly visual checks, bed alarms, and scheduled toileting; no falls or injuries occurred.

## 2024-03-19 NOTE — Unmapped (Signed)
 Geriatrics ([facility]) Progress Note    Assessment & Plan:   [name] is a 78 y.o. 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 Found to have acute hypercarbic respiratory failure, acute hepatic encephalopathy, and hypothermia following procedure, prompting intubation. Transferred to MICU for further management. While there, she was extubated and weaned to room air. She was down-graded to step down status and transferred to [facility] for further management of encephalopathy, E coli bacteremia, and anticoagulation therapy.    Principal Problem:    Bacteremia, escherichia coli  Active Problems:    Alcoholic liver disease (HHS-HCC)    HTN (hypertension)    Depression with anxiety    Class 2 severe obesity with serious comorbidity in adult    Atrial fibrillation    (CMS-HCC)    Pleural effusion    Hypernatremia    Thrombocytopenia    Cirrhosis    (CMS-HCC)    A-fib (CMS-HCC)    Hepatic encephalopathy    (CMS-HCC)      # Atrial Fibrillation - Left Atrial Appendage Thrombus (03/10/24)  New onset (03/2024), continued on home Coreg  3.125 mg BID. Started on Eliquis (cost-prohibitive due to not meeting deductible). Attempted TEE DCCV however aborted due to LAA thrombus. Have been unable to anticoagulate due to c/f GIB. Attempted to restart SQH ppx on 10/12-10/13, however patient had recurrent hematochezia and some light red secretions after extubation so all anticoagulation was held. Cardiology consulted and assisting with management. Patient has tolerated SQH trial well and will trial heparin gtt starting 10/18.  - HOLD Eliquis 5 mg BID in setting of GIB  - Start Heparin gtt (arrhythmia nomogram) 10/18  - Cardiology consulted, appreciate recommendations  - Outpatient repeat TEE to assess for LAA thrombus resolution.  - Remove central line 10/19 if hemodynamically stable    # Toxic Metabolic Encephalopathy, improving  Patient presented for scheduled TEE/DCCV with Cardiology on 10/10, received 50 mg propofol  and found to be difficult to wake following sedation with hypercarbic respiratory failure. Per chart review, initially poorly responsive to stimulation and started on NIPPV with BiPAP. Hypercarbia improved but remained altered eventually requiring intubation as below. CT Head 10/10 without acute intracranial process. Initial concern for hepatic encephalopathy has abated given lack of neurological symptoms and lactulose  was discontinued. Suspected likely multifactorial in the setting of hypercarbia, electrolyte abnormalities, infection. She is now improving and close to her baseline.  - Delirium precautions     # Acute on Chronic Hypoxic and Hypercarbic Respiratory Failure, Improving  # Likely OSA  Presented with somnolence and difficulty to rouse s/p TEE/DCCV sedation as above. Transferred to CCU w/ pulm consult and placed on NIPPV. CXR with slight pulmonary edema s/p diuresis. Hypercarbia corrected on BiPAP then AVAPS from 86 > 58 but ultimately required intubation due to acute hypoxic respiratory failure from hypoventilation likely due to encephalopathy. Extubated to [place] on 10/13 and now stable on room air. Has been doing well on room air without need for additional oxygen supplementation overnight.   - Continuous pulse ox monitoring     # Sepsis 2/2 E. Coli Bacteremia  Afebrile upon arrival to scheduled procedure via axillary temp, but found to be hypothermic to 34.1C via foley catheter. Otherwise, HDS without leukocytosis. Hypothermia improved with Bair Hugger.  Eventually sedated and intubated as above for acute hypoxic respiratory failure and encephalopathy. No fluid pocket for diagnostic paracentesis on POCUS. CXR with interstitial edema and bilateral pulmonary effusions but no focal consolidation. UA noninfectious. Blood cultures 10/10 found to be positive for E. Coli. Required levophed briefly but remained stable after weaning off 10/12.  Completed treatment with Zosyn followed by Cefazolin  10/10-10/17.  Unclear etiology though possible colonic source iso mass-like thickening involving the lower sigmoid colon (see below).  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 No hematemesis. Received variceal bleed ppx (PPI, ceftriaxone, and octreotide).  Low suspicion for variceal bleeding given HD stability and stable hemoglobin. Suspect related to colonic mass. Had recurrent hematochezia on 10/13 in the s/o challenge of subQ heparin, however has resolved since this was stopped. Per luminal GI 10/16, do not need CT GI bleed at this time as she is not actively bleeding. Plan to consult GI officially if bleeding begins. Hemoglobin has remained stable. Current plan is for outpatient colonoscopy for further workup, however may need to consider inpatient pending how current trial of subQ heparin goes.  - PO daily PPI  - Colonoscopy outpatient for incidental finding concerning for sigmoid colon malignancy     # Chronic HFpEF  # HTN  On admission, pro-BNP 520, slightly elevated from 490 at previous discharge but improved from 846 on 02/29/24. CXR with interstitial edema and bilateral pulmonary effusions. GDMT initially held due to concern for acute infection, but regimen was gradually restarted as tolerated given her clinical improvement.  On physical exam, not demonstrating markers of volume overload at this time. Continuing to hold diuretics given patient is close to estimated dry weight, and her O2 sats and edema have been stable.  - Home GDMT  - BB: Coreg  3.125 mg BID, increase as needed. HR goal <110  - ARB/ACEi/ARNI: BEGIN Valsartan 20 mg daily. Can increase to 40 mg as tolerated.  - MRA: Spironolactone 25 mg daily  - SGLT2i: BEGIN Jardiance 10 mg daily   - HOLD Diuretics  - Daily Weights, EDW 195 lbs  - Strict I/Os  - Goal K >4, Mg >2  - Daily BMP, Mg     # MASLD Cirrhosis  # Hx Hepatic Encephalopathy  # Thrombocytopenia  History of decompensated metabolic-associated cirrhosis with HE, G1 EV and PHG on EGD 01/2018. Previously followed with hepatology but lost to follow up in 2020. Formerly prescribed lactulose  but has not been on it in several years. Presented altered and encephalopathic following sedation as above. Ammonia level elevated 192 -> 251 -> 82 from previous level of 30. Liver doppler US  negative for acute thrombosis. Had been receiving lactulose  enemas at OSH. Concern for development of GI bleed on 10/10 AM, GI consulted with low suspicion for variceal bleed, initially recommend placing NG tube for lactulose  administration to prevent further bleed. Most recently recommend stopping lactulose  given absence of asterixis and to avoid worsening electrolyte abnormalities and bowel urgency.   - Hepatology consulted, appreciate recommendations  - HOLD diuretics as above iso electrolyte abnormalities  - Daily MELD labs  - Follow up with GI outpatient    MELD 3.0: 14 at 03/19/2024  5:51 AM  MELD-Na: 11 at 03/19/2024  5:51 AM  Calculated from:  Serum Creatinine: 0.46 mg/dL (  Using min of 1 mg/dL) at 89/81/7974  4:48 AM  Serum Sodium: 150 mmol/L (Using max of 137 mmol/L) at 03/19/2024  5:51 AM  Total Bilirubin: 1.5 mg/dL at 89/81/7974  4:48 AM  Serum Albumin: 2.4 g/dL at 89/81/7974  4:48 AM  INR(ratio): 1.31 at 03/19/2024  5:51 AM  Age at listing (hypothetical): 78 years  Sex: Female at 03/19/2024  5:51 AM    # Hypernatremia  Suspected iso poor po intake. S/p post pyloric corpak placement.  - Na level daily  - Encourage PO intake    Chronic Problems     # Vertebral Compression Fracture  T12 vertebral compression fracture with retropulsion into the ventral spinal canal on CT. Has known history of osteoporosis identified on Dexa scan of femoral neck (11/2022 T score -2.9).      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  Behavioral Symptoms (baseline or now): No.     Mobility Baseline functional status: Independent in ADLs  Current functional status: Needs Assistance with ADLs  PT/OT Ordered: Recommend AIR   Medications Reviewed home medications, screening for potentially inappropriate medications: Yes  Medications recommended de-prescribing: n/a   What Matters Geri Assessment complete: No, needs to be done         Issues Impacting Complexity of Management:  -Intensive monitoring of drug toxicity from Zofran  with EKG to monitor QTc and cefazolin  with BMP  -High risk of complications from pain and/or analgesia likely to result in delirium  -The patient is at high risk from Hospital immobility in an elderly patient given baseline poor functional status with a high risk of causing delirium and further decline in function  -The patient is at high risk for the development of complications of volume overload due to the need to provide IV hydration for suspected hypovolemia in the setting of: heart failure and Cirrhosis  -Need for the following intensive monitoring parameter(s) due to high risk of clinical decline: frequent monitoring of urine output to assess for the efficacy of diuretic regimen  -The patient is at high risk of complications from delirium      Daily Checklist:  Diet: GI Softs  DVT PPx: Contraindicated - High Risk for Bleeding/Active Bleeding; however, patient is hypercoagulable  Electrolytes: Replete Potassium to >/=4 and Magnesium  to >/=2  Code Status: Full Code  Dispo: AIR    Team Contact Information:   Primary Team: Geriatrics ([facility])  Primary Resident: Lacinda Rimes, MD  Resident's Pager: [phone] (Geriatrics Intern - [place])    Interval History:   No acute events overnight.     Patient doing well this morning, her daughter is in the room with her.  She denies any further hematochezia.  Is able to follow conversation well this morning.  Asked appropriate follow-up questions.  Also asked us  to call her other daughter who manages her medications given that we have made some changes to her regimen.    Objective:   Temp:  [35.6 ??C (96.1 ??F)-36.5 ??C (97.7 ??F)] 35.8 ??C (96.4 ??F)  Pulse:  [75-85] 82  Resp:  [18] 18  BP: (90-149)/(67-77) 135/67  SpO2:  [94 %-96 %] 96 %    Gen: NAD  Eyes: sclera  anicteric  HENT: MMM, OP w/o erythema or exudate   Heart: RRR, well-perfused  Lungs: Normal work of breathing on room air  Abdomen: soft, NTND  Extremities: no clubbing, cyanosis, moderate edema in the right lower extremity, minimal edema in the left lower extremity  Psych: Alert&Ox4, attends to examiner    Labs/Studies: Labs and Studies from the last 24hrs per EMR and Reviewed    Lacinda Rimes, MD  PGY-1, [facility] Internal Medicine

## 2024-03-19 NOTE — Unmapped (Signed)
 Shift Summary  Skin folds were assessed for moisture associated dermatitis and scattered bruising, with frequent repositioning and weight shifts performed to support skin integrity.   ADL tasks were completed with moderate assistance, and fatigue was managed by grading activities to allow completion in a recliner chair.   Diminished breath sounds and exceptions to WDL were noted, but respiratory rate and oxygen saturation remained stable on room air.   Fall reduction strategies were consistently maintained, including alarms and hourly checks, with no falls or injuries reported.   Blood pressure, pulse, and MAP remained stable, and scheduled medications for heart failure were administered.     Skin Health and Integrity: Moisture associated dermatitis was noted in skin folds and scattered bruising was present; frequent weight shifts and regular repositioning were performed throughout the shift to support skin integrity, and no new skin breakdowns were documented. Epidermis remained thin with loss of subcutaneous tissue, and generalized edema persisted.     Improved Ability to Complete Activities of Daily Living: ADL tasks were completed with moderate assistance, and the patient was able to perform oral care and face washing with supervision in sitting; upper body dressing and personal grooming required minimal assistance, while lower body dressing, bathing, and toileting required moderate assistance. Fatigue was reported after ADL tasks, and interventions were graded to allow completion in a recliner chair.     Effective Breathing Pattern: Respiratory rate remained stable and SpO2 was unchanged on room air; diminished bilateral breath sounds and exceptions to WDL were noted, but no acute changes occurred during the shift.     Absence of Fall and Fall-Related Injury: Fall reduction strategies were maintained with bed and chair alarms, hourly visual checks, and use of a front wheel walker; patient was awake and supervised during all position changes, and no falls or injuries were documented.     Maintenance of Heart Failure Symptom Control: Blood pressure, pulse, and MAP remained stable throughout the shift, and cardiac assessment was within defined limits; heparin and valsartan were administered as scheduled.

## 2024-03-20 LAB — CBC W/ AUTO DIFF
BASOPHILS ABSOLUTE COUNT: 0 10*9/L (ref 0.0–0.1)
BASOPHILS RELATIVE PERCENT: 0.9 %
EOSINOPHILS ABSOLUTE COUNT: 0.3 10*9/L (ref 0.0–0.5)
EOSINOPHILS RELATIVE PERCENT: 7.8 %
HEMATOCRIT: 34.3 % (ref 34.0–44.0)
HEMOGLOBIN: 11.7 g/dL (ref 11.3–14.9)
LYMPHOCYTES ABSOLUTE COUNT: 1.1 10*9/L (ref 1.1–3.6)
LYMPHOCYTES RELATIVE PERCENT: 27 %
MEAN CORPUSCULAR HEMOGLOBIN CONC: 34.2 g/dL (ref 32.0–36.0)
MEAN CORPUSCULAR HEMOGLOBIN: 33.8 pg — ABNORMAL HIGH (ref 25.9–32.4)
MEAN CORPUSCULAR VOLUME: 98.7 fL — ABNORMAL HIGH (ref 77.6–95.7)
MEAN PLATELET VOLUME: 7.8 fL (ref 6.8–10.7)
MONOCYTES ABSOLUTE COUNT: 0.4 10*9/L (ref 0.3–0.8)
MONOCYTES RELATIVE PERCENT: 10.7 %
NEUTROPHILS ABSOLUTE COUNT: 2.1 10*9/L (ref 1.8–7.8)
NEUTROPHILS RELATIVE PERCENT: 53.6 %
NUCLEATED RED BLOOD CELLS: 0 /100{WBCs} (ref <=4)
PLATELET COUNT: 100 10*9/L — ABNORMAL LOW (ref 150–450)
RED BLOOD CELL COUNT: 3.47 10*12/L — ABNORMAL LOW (ref 3.95–5.13)
RED CELL DISTRIBUTION WIDTH: 17.6 % — ABNORMAL HIGH (ref 12.2–15.2)
WBC ADJUSTED: 4 10*9/L (ref 3.6–11.2)

## 2024-03-20 LAB — APTT
APTT: 270 s (ref 24.8–38.4)
APTT: 168.3 s (ref 24.8–38.4)
APTT: 122.1 s — ABNORMAL HIGH (ref 24.8–38.4)
HEPARIN CORRELATION: 1.5
HEPARIN CORRELATION: 1
HEPARIN CORRELATION: 0.7

## 2024-03-20 LAB — PHOSPHORUS: PHOSPHORUS: 2.9 mg/dL (ref 2.4–5.1)

## 2024-03-20 LAB — PROTIME-INR
INR: 1.3
PROTIME: 14.8 s — ABNORMAL HIGH (ref 9.9–12.6)

## 2024-03-20 LAB — COMPREHENSIVE METABOLIC PANEL
ALBUMIN: 2.3 g/dL — ABNORMAL LOW (ref 3.4–5.0)
ALKALINE PHOSPHATASE: 107 U/L (ref 46–116)
ALT (SGPT): 11 U/L (ref 10–49)
ANION GAP: 11 mmol/L (ref 5–14)
AST (SGOT): 28 U/L (ref <=34)
BILIRUBIN TOTAL: 1.3 mg/dL — ABNORMAL HIGH (ref 0.3–1.2)
BLOOD UREA NITROGEN: 5 mg/dL — ABNORMAL LOW (ref 9–23)
BUN / CREAT RATIO: 12
CALCIUM: 8.8 mg/dL (ref 8.7–10.4)
CHLORIDE: 109 mmol/L — ABNORMAL HIGH (ref 98–107)
CO2: 29.2 mmol/L (ref 20.0–31.0)
CREATININE: 0.41 mg/dL — ABNORMAL LOW (ref 0.55–1.02)
EGFR CKD-EPI (2021) FEMALE: >90 mL/min/1.73m2 (ref >=60)
GLUCOSE RANDOM: 83 mg/dL (ref 70–179)
POTASSIUM: 3.6 mmol/L (ref 3.4–4.8)
PROTEIN TOTAL: 5.2 g/dL — ABNORMAL LOW (ref 5.7–8.2)
SODIUM: 149 mmol/L — ABNORMAL HIGH (ref 135–145)

## 2024-03-20 LAB — MAGNESIUM: MAGNESIUM: 1.9 mg/dL (ref 1.6–2.6)

## 2024-03-20 MED ADMIN — multivitamins, therapeutic with minerals tablet 1 tablet: 1 | ORAL | @ 13:00:00

## 2024-03-20 MED ADMIN — heparin 25,000 Units/250 mL (100 units/mL) in 0.45% saline infusion (premade): 0-24 [IU]/kg/h | INTRAVENOUS | @ 19:00:00

## 2024-03-20 MED ADMIN — empagliflozin (JARDIANCE) tablet 10 mg: 10 mg | ORAL | @ 13:00:00

## 2024-03-20 MED ADMIN — thiamine mononitrate (vit B1) tablet 100 mg: 100 mg | ORAL | @ 13:00:00

## 2024-03-20 MED ADMIN — folic acid (FOLVITE) tablet 1 mg: 1 mg | ORAL | @ 13:00:00

## 2024-03-20 MED ADMIN — magnesium oxide (MAG-OX) tablet 400 mg: 400 mg | ORAL | @ 13:00:00

## 2024-03-20 MED ADMIN — carvedilol (COREG) tablet 3.125 mg: 3.125 mg | ORAL | @ 13:00:00

## 2024-03-20 MED ADMIN — potassium phosphate (monobasic) (K-PHOS) tablet 1,000 mg: 1000 mg | ORAL | @ 02:00:00 | Stop: 2024-03-19

## 2024-03-20 MED ADMIN — pantoprazole (Protonix) EC tablet 40 mg: 40 mg | ORAL | @ 13:00:00

## 2024-03-20 MED ADMIN — carvedilol (COREG) tablet 3.125 mg: 3.125 mg | ORAL | @ 01:00:00

## 2024-03-20 MED ADMIN — cyanocobalamin (vitamin B-12) tablet 1,000 mcg: 1000 ug | ORAL | @ 13:00:00

## 2024-03-20 MED ADMIN — magnesium oxide (MAG-OX) tablet 400 mg: 400 mg | ORAL | @ 01:00:00

## 2024-03-20 MED ADMIN — anastrozole (ARIMIDEX) tablet 1 mg: 1 mg | ORAL | @ 13:00:00

## 2024-03-20 MED ADMIN — spironolactone (ALDACTONE) tablet 25 mg: 25 mg | ORAL | @ 13:00:00

## 2024-03-20 MED ADMIN — valsartan (DIOVAN) tablet 20 mg: 20 mg | ORAL | @ 13:00:00

## 2024-03-20 NOTE — Unmapped (Signed)
 [name] ([facility]) Progress Note    Assessment & Plan:   [name] is a 78 y.o. 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 Found to have acute hypercarbic respiratory failure, acute hepatic encephalopathy, and hypothermia following procedure, prompting intubation. Transferred to MICU for further management. While there, she was extubated and weaned to room air. She was down-graded to step down status and transferred to [facility] for further management of encephalopathy, E coli bacteremia, and anticoagulation therapy.    Principal Problem:    Bacteremia, escherichia coli  Active Problems:    Alcoholic liver disease (HHS-HCC)    HTN (hypertension)    Depression with anxiety    Class 2 severe obesity with serious comorbidity in adult    Atrial fibrillation    (CMS-HCC)    Pleural effusion    Hypernatremia    Thrombocytopenia    Cirrhosis    (CMS-HCC)    A-fib (CMS-HCC)    Hepatic encephalopathy    (CMS-HCC)      # Atrial Fibrillation - Left Atrial Appendage Thrombus (03/10/24)  New onset (03/2024), continued on home Coreg  3.125 mg BID. Started on Eliquis (cost-prohibitive due to not meeting deductible). Attempted TEE DCCV however aborted due to LAA thrombus. Have been unable to anticoagulate due to c/f GIB. Attempted to restart SQH ppx on 10/12-10/13, however patient had recurrent hematochezia and some light red secretions after extubation so all anticoagulation was held. Cardiology consulted and assisting with management. Patient tolerated SQH trial and has been hemodynamically stable on heparin gtt since starting on 10/18.  - HOLD Eliquis 5 mg BID in setting of GIB  - Start Heparin gtt (arrhythmia nomogram) 10/18 0900   - Transition to Lovenox BID 10/20 if HDS 48 hours after beginning therapeutic heparin   - Daily CBC   - Monitor for Hematochezia  - Cardiology consulted, appreciate recommendations  - Outpatient repeat TEE to assess for LAA thrombus resolution.  - Remove central line 10/19 afternoon given hemodynamic stability    # Toxic Metabolic Encephalopathy, improving  Patient presented for scheduled TEE/DCCV with Cardiology on 10/10, received 50 mg propofol  and found to be difficult to wake following sedation with hypercarbic respiratory failure. Per chart review, initially poorly responsive to stimulation and started on NIPPV with BiPAP. Hypercarbia improved but remained altered eventually requiring intubation as below. CT Head 10/10 without acute intracranial process. Initial concern for hepatic encephalopathy has abated given lack of neurological symptoms and lactulose  was discontinued. Suspected likely multifactorial in the setting of hypercarbia, electrolyte abnormalities, infection. She is now improving and close to her baseline.  - Delirium precautions     # Acute on Chronic Hypoxic and Hypercarbic Respiratory Failure, Improving  # Likely OSA  Presented with somnolence and difficulty to rouse s/p TEE/DCCV sedation as above. Transferred to CCU w/ pulm consult and placed on NIPPV. CXR with slight pulmonary edema s/p diuresis. Hypercarbia corrected on BiPAP then AVAPS from 86 > 58 but ultimately required intubation due to acute hypoxic respiratory failure from hypoventilation likely due to encephalopathy. Extubated to [place] on 10/13 and now stable on room air. Has been doing well on room air without need for additional oxygen supplementation overnight.   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 No hematemesis. Received variceal bleed ppx (PPI, ceftriaxone, and octreotide).  Low suspicion for variceal bleeding given HD stability and stable hemoglobin. Suspect related to colonic mass. Had recurrent hematochezia on 10/13 in the s/o challenge of subQ heparin, however has resolved since this was stopped. Per luminal GI 10/16, do not need CT GI bleed at this time as she is not actively bleeding. Plan to consult GI officially if bleeding begins. Hemoglobin has remained stable. Current plan is for outpatient colonoscopy for further workup, however may need to consider inpatient pending how current trial of subQ heparin goes.  - PO daily PPI  - Colonoscopy outpatient for incidental finding concerning for sigmoid colon malignancy     # Chronic HFpEF  # HTN  On admission, pro-BNP 520, slightly elevated from 490 at previous discharge but improved from 846 on 02/29/24. CXR with interstitial edema and bilateral pulmonary effusions. GDMT initially held due to concern for acute infection, but regimen was gradually restarted as tolerated given her clinical improvement.  On physical exam, not demonstrating markers of volume overload at this time. Continuing to hold diuretics given patient is close to estimated dry weight, and her O2 sats and edema have been stable.  - Home GDMT  - BB: Coreg  3.125 mg BID, increase as needed. HR goal <110  - ARB/ACEi/ARNI: BEGIN Valsartan 20 mg daily. Can increase to 40 mg as tolerated.  - MRA: Spironolactone 25 mg daily  - SGLT2i: BEGIN Jardiance 10 mg daily   - HOLD Diuretics  - Daily Weights, EDW 195 lbs  - Strict I/Os  - Goal K >4, Mg >2  - Daily BMP, Mg     # MASLD Cirrhosis  # Hx Hepatic Encephalopathy  # Thrombocytopenia  History of decompensated metabolic-associated cirrhosis with HE, G1 EV and PHG on EGD 01/2018. Previously followed with hepatology but lost to follow up in 2020. Formerly prescribed lactulose  but has not been on it in several years. Presented altered and encephalopathic following sedation as above. Ammonia level elevated 192 -> 251 -> 82 from previous level of 30. Liver doppler US  negative for acute thrombosis. Had been receiving lactulose  enemas at OSH. Concern for development of GI bleed on 10/10 AM, GI consulted with low suspicion for variceal bleed, initially recommend placing NG tube for lactulose  administration to prevent further bleed. Most recently recommend stopping lactulose  given absence of asterixis and to avoid worsening electrolyte abnormalities and bowel urgency.   - Hepatology consulted, appreciate recommendations  - HOLD diuretics as above iso electrolyte abnormalities  - Daily MELD labs  - Follow up with GI outpatient    MELD 3.0: 13 at 03/20/2024  6:21 AM  MELD-Na: 10 at 03/20/2024  6:21 AM  Calculated from:  Serum Creatinine: 0.41 mg/dL (Using min of 1 mg/dL) at 89/80/7974  3:78 AM  Serum Sodium: 149 mmol/L (Using max of 137 mmol/L) at 03/20/2024  6:21 AM  Total Bilirubin: 1.3 mg/dL at 89/80/7974  3:78 AM  Serum Albumin: 2.3 g/dL at 89/80/7974  3:78 AM  INR(ratio): 1.3 at 03/20/2024  6:21 AM  Age at listing (hypothetical): 78 years  Sex: Female at 03/20/2024  6:21 AM    # Hypernatremia  Suspected iso poor po intake. S/p post pyloric corpak placement.  - Na level daily  - Encourage PO intake  Chronic Problems     # Vertebral Compression Fracture  T12 vertebral compression fracture with retropulsion into the ventral spinal canal on CT. Has known history of osteoporosis identified on Dexa scan of femoral neck (11/2022 T score -2.9).      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 Coli Bacteremia (Resolved)  Afebrile upon arrival to scheduled procedure via axillary temp, but found to be hypothermic to 34.1C via foley catheter. Otherwise, HDS without leukocytosis. Hypothermia improved with Bair Hugger. Eventually sedated and intubated as above for acute hypoxic respiratory failure and encephalopathy. No fluid pocket for diagnostic paracentesis on POCUS. CXR with interstitial edema and bilateral pulmonary effusions but no focal consolidation. UA noninfectious. Blood cultures 10/10 found to be positive for E. Coli. Required levophed briefly but remained stable after weaning off 10/12.  Completed treatment with Zosyn followed by Cefazolin  10/10-10/17.  Repeat blood cultures 10/12 were negative.  Unclear etiology though possible colonic source iso mass-like thickening involving the lower sigmoid colon (see below).        Mentation CAM: Overall CAM-ICU: Negative  6CIT:    Not yet completed (please touch base with nursing)  Delirium Order Set: Ordered (appropriate for any patient >65)  Dementia: No.  Behavioral Symptoms (baseline or now): No.     Mobility Baseline functional status: Independent in ADLs  Current functional status: Needs Assistance with ADLs  PT/OT Ordered: Recommend AIR   Medications Reviewed home medications, screening for potentially inappropriate medications: Yes  Medications recommended de-prescribing: n/a   What Matters Geri Assessment complete: No, needs to be done         Issues Impacting Complexity of Management:  -Intensive monitoring of drug toxicity from Zofran  with EKG to monitor QTc and cefazolin  with BMP  -High risk of complications from pain and/or analgesia likely to result in delirium  -The patient is at high risk from Hospital immobility in an elderly patient given baseline poor functional status with a high risk of causing delirium and further decline in function  -The patient is at high risk for the development of complications of volume overload due to the need to provide IV hydration for suspected hypovolemia in the setting of: heart failure and Cirrhosis  -Need for the following intensive monitoring parameter(s) due to high risk of clinical decline: frequent monitoring of urine output to assess for the efficacy of diuretic regimen  -The patient is at high risk of complications from delirium      Daily Checklist:  Diet: GI Softs  DVT PPx: Contraindicated - High Risk for Bleeding/Active Bleeding; however, patient is hypercoagulable  Electrolytes: Replete Potassium to >/=4 and Magnesium  to >/=2  Code Status: Full Code  Dispo: AIR    Team Contact Information:   Primary Team: [name] ([facility])  Primary Resident: Lacinda Rimes, MD  Resident's Pager: [phone] ([name] Intern - [place])    Interval History:   No acute events overnight.     Patient says that she is feeling good this morning.  Her pain is well-controlled, denies shortness of breath, endorses eating some of her breakfast though it seemed to be a large portion.  She plans on sitting in the chair today and the RN will help her accomplish this.  Encourage p.o. intake.  Otherwise, no questions or concerns at this time.    Objective:   Temp:  [  35.3 ??C (95.5 ??F)-36.1 ??C (97 ??F)] 36.1 ??C (97 ??F)  Pulse:  [75-86] 76  Resp:  [16-18] 16  BP: (132-144)/(72-89) 144/89  SpO2:  [95 %-97 %] 97 %    Gen: NAD  Eyes: sclera anicteric  HENT: MMM, OP w/o erythema or exudate   Heart: RRR, well-perfused, no murmurs, rubs, or gallops  Lungs: CTAB anteriorly, normal WOB on room air  Abdomen: soft, NTND  Extremities: no clubbing, cyanosis, minimal edema in the bilateral lower extremities  Psych: Alert&Ox4, attends to examiner    Labs/Studies: Labs and Studies from the last 24hrs per EMR and Reviewed    Lacinda Rimes, MD  PGY-1, [facility] Internal Medicine

## 2024-03-20 NOTE — Unmapped (Signed)
 Shift Summary  Heparin therapy was adjusted and administered during the shift, with a high aPTT noted in the morning and a rate change followed by administration in the afternoon.  Bruising and thin skin were observed, and frequent repositioning and weight shifts were performed to support skin health.  Fall prevention strategies were maintained throughout the shift, including bed alarms and hourly checks, with no falls reported.  Moderate to maximum assistance was required for most ADLs, and formal goals for grooming and dressing were deferred for this session.  The patient remained on bedrest and tolerated treatment well, with ongoing barriers to discharge including impaired balance and an inaccessible home environment.  Pt worked with OT this shift, oob to chair    Skin Health and Integrity: Bruising was noted on the skin with scattered locations and thin epidermis, but frequent weight shifts and regular repositioning were maintained throughout the shift to support skin integrity; wound care included cleansing and leaving the area open to air without dressing.    Improved Ability to Complete Activities of Daily Living: Formal goals for grooming and dressing were deferred during this session, and moderate to maximum assistance was required for most ADLs, with minimal assistance needed for upper body dressing and grooming and independent eating reported.    Absence of Fall and Fall-Related Injury: Bed alarm and fall reduction program were consistently maintained, hourly visual checks documented the patient awake and in bed, and no fall events were reported during the shift.    Optimal Cognitive Function: Forgetfulness and decreased cognition were documented, and cognitive skills development was included in planned interventions for the session.

## 2024-03-20 NOTE — Unmapped (Signed)
 Shift Summary  aPTT resulted as high panic at 2:08 AM, which may relate to ongoing therapy. Provider updated.  Followed Heparin protocol as ordered.  Frequent repositioning and pressure reduction techniques were maintained to support skin integrity and prevent pressure injuries.  Fall prevention strategies, including bed alarms, low bed, and scheduled toileting, were consistently implemented with no falls reported.  Pain remained well controlled at 0, and mood was calm and cooperative throughout the shift.  Overall, the shift was stable with ongoing interventions to support safety, comfort, and skin health.    Skin Health and Integrity: Skin assessment revealed moisture-associated dermatitis in skin folds and generalized itching, with thin epidermis and loss of subcutaneous tissue; frequent repositioning and pressure reduction techniques were maintained, and no dressings were present on the sacrum or left toe wounds, which are in the process of healing.    Absence of Hospital-Acquired Illness or Injury: No new hospital-acquired injuries were documented during the shift, and safety interventions such as bed alarms, low bed, and side rails were consistently in place; a high aPTT was noted, which may relate to ongoing therapy.    Optimal Comfort and Wellbeing: Pain was consistently reported as 0 throughout the shift, and mood was calm and cooperative with no needs expressed.    Improved Ability to Complete Activities of Daily Living: Mobility and activity remained limited, with bedrest and chairfast status documented; patient was able to express feelings and thoughts and make self understood.    Absence of Fall and Fall-Related Injury: Fall prevention strategies were maintained throughout the shift, including bed alarms, low bed, side rails, hourly checks, and scheduled toileting, with no falls or injuries reported.

## 2024-03-20 NOTE — Unmapped (Signed)
 ULTRASOUND PIV PROCEDURE NOTE    Indications:   Poor venous access.    Ultrasound guidance was necessary to obtain access.     Procedure Details:  Identity of the patient was confirmed via name, medical record number and date of birth. The availability of the correct equipment was verified.    The vein was identified and measured for ultrasound catheter insertion.       Vein measurement (without tourniquet):   0.34 cm   A(n) 22 gauge 1.75 catheter was selected based on the recommendations below:    [facility] Catheter/Vein Ratio Guidelines    Chart to determine PIV catheter size/length to use based on vein diameter and depth   Catheter Gauge Size (g)  22g 20g 18g   Catheter length (inches)  1.75 1.75 1.75   Catheter diameter measurement (mm) 0.9 mm 1.1 mm 1.3 mm          Minimum required vein diameter       Sonosite (cm)  0.27 cm 0.33 cm 0.39 cm          Maximum vein depth  1.25 cm 1.25 cm 1.25 cm          The field was prepared with necessary supplies and equipment.  Probe cover and sterile gel utilized. Insertion site was prepped with chlorhexidineand allowed to dry.  The catheter extension was primed with normal saline.  The US  PIV was placed in the L Upper Arm with 1attempt(s). See LDA for additional details.    Catheter aspirated, 3 mL blood return present. The catheter was then flushed with 10 mL of normal saline. Insertion site cleansed, and dressing applied per manufacturer guidelines. The catheter was inserted without difficulty by Adine Negri, RN.    Thank you,     Adine Negri, RN    Ultrasound Resource Nurse    Workup / Procedure Time:  30 minutes

## 2024-03-21 LAB — CBC W/ AUTO DIFF
BASOPHILS ABSOLUTE COUNT: 0 10*9/L (ref 0.0–0.1)
BASOPHILS RELATIVE PERCENT: 0.5 %
EOSINOPHILS ABSOLUTE COUNT: 0.2 10*9/L (ref 0.0–0.5)
EOSINOPHILS RELATIVE PERCENT: 6.4 %
HEMATOCRIT: 35.3 % (ref 34.0–44.0)
HEMOGLOBIN: 12 g/dL (ref 11.3–14.9)
LYMPHOCYTES ABSOLUTE COUNT: 0.8 10*9/L — ABNORMAL LOW (ref 1.1–3.6)
LYMPHOCYTES RELATIVE PERCENT: 26.1 %
MEAN CORPUSCULAR HEMOGLOBIN CONC: 34 g/dL (ref 32.0–36.0)
MEAN CORPUSCULAR HEMOGLOBIN: 33.9 pg — ABNORMAL HIGH (ref 25.9–32.4)
MEAN CORPUSCULAR VOLUME: 99.7 fL — ABNORMAL HIGH (ref 77.6–95.7)
MEAN PLATELET VOLUME: 7.9 fL (ref 6.8–10.7)
MONOCYTES ABSOLUTE COUNT: 0.3 10*9/L (ref 0.3–0.8)
MONOCYTES RELATIVE PERCENT: 8.6 %
NEUTROPHILS ABSOLUTE COUNT: 1.8 10*9/L (ref 1.8–7.8)
NEUTROPHILS RELATIVE PERCENT: 58.4 %
NUCLEATED RED BLOOD CELLS: 0 /100{WBCs} (ref <=4)
PLATELET COUNT: 103 10*9/L — ABNORMAL LOW (ref 150–450)
RED BLOOD CELL COUNT: 3.54 10*12/L — ABNORMAL LOW (ref 3.95–5.13)
RED CELL DISTRIBUTION WIDTH: 18 % — ABNORMAL HIGH (ref 12.2–15.2)
WBC ADJUSTED: 3.1 10*9/L — ABNORMAL LOW (ref 3.6–11.2)

## 2024-03-21 LAB — PROTIME-INR
INR: 1.17
PROTIME: 13.3 s — ABNORMAL HIGH (ref 9.9–12.6)

## 2024-03-21 LAB — COMPREHENSIVE METABOLIC PANEL
ALBUMIN: 2.5 g/dL — ABNORMAL LOW (ref 3.4–5.0)
ALKALINE PHOSPHATASE: 110 U/L (ref 46–116)
ALT (SGPT): 14 U/L (ref 10–49)
ANION GAP: 13 mmol/L (ref 5–14)
AST (SGOT): 35 U/L — ABNORMAL HIGH (ref <=34)
BILIRUBIN TOTAL: 1.3 mg/dL — ABNORMAL HIGH (ref 0.3–1.2)
BLOOD UREA NITROGEN: 9 mg/dL (ref 9–23)
BUN / CREAT RATIO: 20
CALCIUM: 9 mg/dL (ref 8.7–10.4)
CHLORIDE: 108 mmol/L — ABNORMAL HIGH (ref 98–107)
CO2: 29 mmol/L (ref 20.0–31.0)
CREATININE: 0.45 mg/dL — ABNORMAL LOW (ref 0.55–1.02)
EGFR CKD-EPI (2021) FEMALE: >90 mL/min/1.73m2 (ref >=60)
GLUCOSE RANDOM: 89 mg/dL (ref 70–179)
POTASSIUM: 4.1 mmol/L (ref 3.5–5.1)
PROTEIN TOTAL: 5.5 g/dL — ABNORMAL LOW (ref 5.7–8.2)
SODIUM: 150 mmol/L — ABNORMAL HIGH (ref 135–145)

## 2024-03-21 LAB — APTT
APTT: 105.6 s — ABNORMAL HIGH (ref 24.8–38.4)
HEPARIN CORRELATION: 0.6

## 2024-03-21 LAB — MAGNESIUM: MAGNESIUM: 2.1 mg/dL (ref 1.6–2.6)

## 2024-03-21 LAB — PHOSPHORUS: PHOSPHORUS: 2.8 mg/dL (ref 2.4–5.1)

## 2024-03-21 MED ADMIN — magnesium oxide (MAG-OX) tablet 400 mg: 400 mg | ORAL

## 2024-03-21 MED ADMIN — multivitamins, therapeutic with minerals tablet 1 tablet: 1 | ORAL | @ 12:00:00

## 2024-03-21 MED ADMIN — pantoprazole (Protonix) EC tablet 40 mg: 40 mg | ORAL | @ 12:00:00

## 2024-03-21 MED ADMIN — anastrozole (ARIMIDEX) tablet 1 mg: 1 mg | ORAL | @ 12:00:00

## 2024-03-21 MED ADMIN — thiamine mononitrate (vit B1) tablet 100 mg: 100 mg | ORAL | @ 12:00:00

## 2024-03-21 MED ADMIN — spironolactone (ALDACTONE) tablet 25 mg: 25 mg | ORAL | @ 12:00:00

## 2024-03-21 MED ADMIN — sodium chloride (NS) 0.9 % flush 3 mL: 3 mL | INTRAVENOUS

## 2024-03-21 MED ADMIN — folic acid (FOLVITE) tablet 1 mg: 1 mg | ORAL | @ 12:00:00

## 2024-03-21 MED ADMIN — empagliflozin (JARDIANCE) tablet 10 mg: 10 mg | ORAL | @ 12:00:00

## 2024-03-21 MED ADMIN — cyanocobalamin (vitamin B-12) tablet 1,000 mcg: 1000 ug | ORAL | @ 12:00:00

## 2024-03-21 MED ADMIN — carvedilol (COREG) tablet 3.125 mg: 3.125 mg | ORAL | @ 12:00:00

## 2024-03-21 MED ADMIN — carvedilol (COREG) tablet 3.125 mg: 3.125 mg | ORAL

## 2024-03-21 MED ADMIN — sodium chloride (NS) 0.9 % flush 3 mL: 3 mL | INTRAVENOUS | @ 08:00:00

## 2024-03-21 MED ADMIN — enoxaparin (LOVENOX) syringe 70 mg: 70 mg | SUBCUTANEOUS | @ 16:00:00 | Stop: 2024-03-28

## 2024-03-21 MED ADMIN — valsartan (DIOVAN) tablet 20 mg: 20 mg | ORAL | @ 12:00:00 | Stop: 2024-03-21

## 2024-03-21 MED ADMIN — magnesium oxide (MAG-OX) tablet 400 mg: 400 mg | ORAL | @ 12:00:00

## 2024-03-21 NOTE — Unmapped (Signed)
 Geriatrics (MEDA) Progress Note    Assessment & Plan:   [name] is a 78 y.o. 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 Found to have acute hypercarbic respiratory failure, acute hepatic encephalopathy, and hypothermia following procedure, prompting intubation. Transferred to MICU for further management. While there, she was extubated and weaned to room air. She was down-graded to step down status and transferred to [facility] for further management of encephalopathy, E coli bacteremia, and anticoagulation therapy.    Principal Problem:    Bacteremia, escherichia coli  Active Problems:    Alcoholic liver disease (HHS-HCC)    HTN (hypertension)    Depression with anxiety    Class 2 severe obesity with serious comorbidity in adult    Atrial fibrillation    (CMS-HCC)    Pleural effusion    Hypernatremia    Thrombocytopenia    Cirrhosis    (CMS-HCC)    A-fib (CMS-HCC)    Hepatic encephalopathy    (CMS-HCC)      # Atrial Fibrillation - Left Atrial Appendage Thrombus (03/10/24)  New onset (03/2024), continued on home Coreg  3.125 mg BID. Started on Eliquis (cost-prohibitive due to not meeting deductible). Attempted TEE DCCV however aborted due to LAA thrombus. Have been unable to anticoagulate due to c/f GIB. Attempted to restart SQH ppx on 10/12-10/13, however patient had recurrent hematochezia and some light red secretions after extubation so all anticoagulation was held. Cardiology consulted and assisting with management. Patient tolerated SQH trial and heparin gtt x48 hours.   - HOLD Eliquis 5 mg BID in setting of GIB  - Start Lovenox 1mg /kg BID 10/20. Continue for 1 week and transition to Eliquis.   - Daily CBC   - Monitor for Hematochezia  - Cardiology consulted, appreciate recommendations  - Outpatient repeat TEE to assess for LAA thrombus resolution.    # Chronic HFpEF  # HTN  On admission, pro-BNP 520, slightly elevated from 490 at previous discharge but improved from 846 on 02/29/24. CXR with interstitial edema and bilateral pulmonary effusions. GDMT initially held due to concern for acute infection, but regimen was gradually restarted as tolerated given her clinical improvement.  On physical exam, not demonstrating markers of volume overload at this time. Continuing to hold diuretics given patient is close to estimated dry weight, and her O2 sats and edema have been stable.  - Home GDMT  - BB: Coreg  3.125 mg BID, increase as needed. HR goal <110  - ARB/ACEi/ARNI: Increased Valsartan to 40 mg daily  - MRA: Spironolactone 25 mg daily  - SGLT2i: BEGIN Jardiance 10 mg daily  - HOLD Diuretics  - Daily Weights, EDW 195 lbs  - Strict I/Os  - Goal K >4, Mg >2  - Daily BMP, Mg    # Toxic Metabolic Encephalopathy, Improved  Patient presented for scheduled TEE/DCCV with Cardiology on 10/10, received 50 mg propofol  and found to be difficult to wake following sedation with hypercarbic respiratory failure. Per chart review, initially poorly responsive to stimulation and started on NIPPV with BiPAP. Hypercarbia improved but remained altered eventually requiring intubation as below. CT Head 10/10 without acute intracranial process. Initial concern for hepatic encephalopathy has abated given lack of neurological symptoms and lactulose  was discontinued. Suspected likely multifactorial in the setting  of hypercarbia, electrolyte abnormalities, infection. She is now improving and close to her baseline.  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 No hematemesis. Received variceal bleed ppx (PPI, ceftriaxone, and octreotide). Low suspicion for variceal bleeding given HD stability and stable hemoglobin. Suspect related to colonic mass. Had recurrent hematochezia on 10/13 in the s/o challenge of subQ heparin, however has resolved since this was stopped. Per luminal GI 10/16, do not need CT GI bleed at this time as she is not actively bleeding. Plan to consult GI officially if bleeding begins. Hemoglobin has remained stable. Current plan is for outpatient colonoscopy for further workup, however may need to consider inpatient pending how current trial of subQ heparin goes.  - PO daily PPI  - Colonoscopy outpatient for incidental finding concerning for sigmoid colon malignancy     # MASLD Cirrhosis  # Hx Hepatic Encephalopathy  # Thrombocytopenia  History of decompensated metabolic-associated cirrhosis with HE, G1 EV and PHG on EGD 01/2018. Previously followed with hepatology but lost to follow up in 2020. Formerly prescribed lactulose  but has not been on it in several years. Presented altered and encephalopathic following sedation as above. Ammonia level elevated 192 -> 251 -> 82 from previous level of 30. Liver doppler US  negative for acute thrombosis. Had been receiving lactulose  enemas at OSH. Concern for development of GI bleed on 10/10 AM, GI consulted with low suspicion for variceal bleed, initially recommend placing NG tube for lactulose  administration to prevent further bleed. Most recently recommend stopping lactulose  given absence of asterixis and to avoid worsening electrolyte abnormalities and bowel urgency.   - Hepatology consulted, appreciate recommendations  - HOLD diuretics as above iso electrolyte abnormalities  - Daily MELD labs  - Follow up with GI outpatient    MELD 3.0: 13 at 03/20/2024  6:21 AM  MELD-Na: 10 at 03/20/2024  6:21 AM  Calculated from:  Serum Creatinine: 0.41 mg/dL (Using min of 1 mg/dL) at 89/80/7974  3:78 AM  Serum Sodium: 149 mmol/L (Using max of 137 mmol/L) at 03/20/2024  6:21 AM  Total Bilirubin: 1.3 mg/dL at 89/80/7974  3:78 AM  Serum Albumin: 2.3 g/dL at 89/80/7974  3:78 AM  INR(ratio): 1.3 at 03/20/2024  6:21 AM  Age at listing (hypothetical): 78 years  Sex: Female at 03/20/2024  6:21 AM    # Hypernatremia  Suspected iso poor po intake. S/p post pyloric corpak placement.  - Na level daily  - Encourage PO intake    Chronic Problems     # Vertebral Compression Fracture  T12 vertebral compression fracture with retropulsion into the ventral spinal canal on CT. Has known history of osteoporosis identified on Dexa scan of femoral neck (11/2022 T score -2.9).      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 Coli Bacteremia (Resolved)  Afebrile upon arrival to scheduled procedure via axillary temp, but found to be hypothermic to 34.1C via foley catheter. Otherwise, HDS without leukocytosis. Hypothermia improved with Bair Hugger. Eventually sedated and intubated as above for acute hypoxic respiratory failure and encephalopathy. No fluid pocket for diagnostic paracentesis on POCUS. CXR with interstitial edema and bilateral pulmonary  effusions but no focal consolidation. UA noninfectious. Blood cultures 10/10 found to be positive for E. Coli. Required levophed briefly but remained stable after weaning off 10/12.  Completed treatment with Zosyn followed by Cefazolin  10/10-10/17.  Repeat blood cultures 10/12 were negative.  Unclear etiology though possible colonic source iso mass-like thickening involving the lower sigmoid colon (see below).    # Acute on Chronic Hypoxic and Hypercarbic Respiratory Failure (Resolved)  # Likely OSA  Presented with somnolence and difficulty to rouse s/p TEE/DCCV sedation as above. Transferred to CCU w/ pulm consult and placed on NIPPV. CXR with slight pulmonary edema s/p diuresis. Hypercarbia corrected on BiPAP then AVAPS from 86 > 58 but ultimately required intubation due to acute hypoxic respiratory failure from hypoventilation likely due to encephalopathy. Extubated to [place] on 10/13 and now stable on room air. Has been doing well on room air without need for additional oxygen supplementation overnight.         Mentation CAM: Overall CAM-ICU: Negative  6CIT:    Not yet completed (please touch base with nursing)  Delirium Order Set: Ordered (appropriate for any patient >65)  Dementia: No.  Behavioral Symptoms (baseline or now): No.     Mobility Baseline functional status: Independent in ADLs  Current functional status: Needs Assistance with ADLs  PT/OT Ordered: Recommend AIR   Medications Reviewed home medications, screening for potentially inappropriate medications: Yes  Medications recommended de-prescribing: n/a   What Matters Geri Assessment complete: No, needs to be done         Issues Impacting Complexity of Management:  -Intensive monitoring of drug toxicity from Zofran  with EKG to monitor QTc and cefazolin  with BMP  -High risk of complications from pain and/or analgesia likely to result in delirium  -The patient is at high risk from Hospital immobility in an elderly patient given baseline poor functional status with a high risk of causing delirium and further decline in function  -The patient is at high risk for the development of complications of volume overload due to the need to provide IV hydration for suspected hypovolemia in the setting of: heart failure and Cirrhosis  -Need for the following intensive monitoring parameter(s) due to high risk of clinical decline: frequent monitoring of urine output to assess for the efficacy of diuretic regimen  -The patient is at high risk of complications from delirium      Daily Checklist:  Diet: GI Softs  DVT PPx: Contraindicated - High Risk for Bleeding/Active Bleeding; however, patient is hypercoagulable  Electrolytes: Replete Potassium to >/=4 and Magnesium  to >/=2  Code Status: Full Code  Dispo: AIR    Team Contact Information:   Primary Team: Geriatrics (MEDA)  Primary Resident: Lacinda Rimes, MD  Resident's Pager: [phone] (Geriatrics Intern - [place])    Interval History:   No acute events overnight.     Patient says she is doing okay this morning.  Says that we gave her arm a workout last night whenever they had to add the extra PIV line in for her.  Otherwise denying any pain, shortness of breath, nausea, vomiting.  Says she has not eaten breakfast yet, but has ordered it.      Objective:   Temp:  [35.1 ??C (95.1 ??F)-36.1 ??C (97 ??F)] 35.9 ??C (96.7 ??F)  Pulse:  [71-95] 71  Resp:  [16-18] 18  BP: (113-144)/(69-89) 140/86  SpO2:  [96 %-100 %] 97 %    Gen: NAD, resting comfortably in bed  Eyes: sclera anicteric  HENT: MMM  Heart: RRR, well-perfused, no murmurs, rubs, or gallops  Lungs: CTAB anteriorly, normal WOB on room air  Abdomen: soft, NTND  GU: Dark golden yellow urine in collecting tube  Extremities: minimal edema in the bilateral lower extremities  Psych: attends to examiner    Labs/Studies: Labs and Studies from the last 24hrs per EMR and Reviewed    Lacinda Rimes, MD  PGY-1, [facility] Internal Medicine

## 2024-03-21 NOTE — Unmapped (Signed)
 Shift Summary  aPTT was found to be elevated during the shift, with no bleeding or dressing interventions required.   Peripheral IV sites remained clean, dry, and intact, and sodium chloride  was administered.   Neurological status was stable, with alertness and orientation maintained.   Cardiac and peripheral vascular assessments noted irregular heart sounds and non-pitting edema, but no acute changes.   Overall, vital signs and pain control remained stable throughout the shift.     Optimal Cognitive Function: Alert and oriented throughout the shift with appropriate judgement and clear speech; no changes in neurological status noted.     Maintenance of Heart Failure Symptom Control: Irregular heart sounds and non-pitting generalized edema were present, with exceptions to cardiac and peripheral vascular assessments; no significant changes in symptoms documented during the shift.     Blood Pressure in Desired Range: Blood pressure remained stable at 140/86 with a MAP of 96; no interventions required for blood pressure management.     Optimal Pain Control and Function: Pain score remained at 0 throughout the shift, and no pain interventions were necessary.     Hemostasis: aPTT was elevated at 122.1 sec, which may be related to heparin therapy; no bleeding or dressing interventions were required, and IV sites remained clean, dry, and intact.

## 2024-03-21 NOTE — Unmapped (Signed)
 Enoxaparin Therapeutic Monitoring Pharmacy Note    [name] is a 77 y.o. female starting enoxaparin.    Indication: Left atrial appendage Thrombus    Prior Dosing Information: Current regimen Heparin Drip    Goals:  Therapeutic Drug Levels  LMWH Anti-Xa level: 0.2-0.4 units/mL drawn 4-6 hours post-dose    Additional Clinical Monitoring/Outcomes  Monitor hemoglobin and platelets  Monitor for signs/symptoms of bleeding  Monitor renal function     Results:  Not applicable  Wt Readings from Last 3 Encounters:   03/21/24 87.7 kg (193 lb 5.5 oz)   03/07/24 89.3 kg (196 lb 12.8 oz)   03/03/24 89.1 kg (196 lb 6.4 oz)     HGB   Date Value Ref Range Status   03/21/2024 12.0 11.3 - 14.9 g/dL Final     Platelet   Date Value Ref Range Status   03/21/2024 103 (L) 150 - 450 10*9/L Final     Creatinine   Date Value Ref Range Status   03/21/2024 0.45 (L) 0.55 - 1.02 mg/dL Final       Pharmacokinetic Considerations and Significant Drug Interactions:   Concurrent antiplatelet medications: none identified    Assessment/Plan:   Recommendation(s)  Start enoxaparin 70mg  (0.85mg /kg) q 12 hours.  Dose reduction made due to patients age and recent GI bleed    Follow-up  Level due: 4-6 hours after the 3rd dose  A pharmacist will continue to monitor and recommend levels as appropriate      Please page service pharmacist with questions/clarifications.    DOROTHA Tanda Moats, Pharm D, BCPS, BCGP

## 2024-03-21 NOTE — Unmapped (Signed)
 Shift Summary  Heparin infusion was stopped and enoxaparin was administered during the shift.   Pain was consistently denied and comfort interventions were effective.   Skin integrity was supported with frequent repositioning and protective devices.   Fall prevention strategies were maintained, with no reported falls or injuries.   Overall, patient remained cooperative and expressed a desire to continue progress towards discharge.     Skin Health and Integrity: Skin protection and pressure reduction interventions were consistently maintained throughout the shift, with frequent repositioning and use of absorbent pads and positioning supports; bruising was noted but Braden Scale score remained high, indicating low risk for pressure injury.     Absence of Hospital-Acquired Illness or Injury: Aseptic technique and infection prevention measures were maintained at all documented intervals, and cohorting in a single room was provided.     Optimal Comfort and Wellbeing: Pain was consistently denied and pain scores remained at zero; spiritual and emotional support was provided, and the patient expressed feeling better physically.     Improved Ability to Complete Activities of Daily Living: Minimal to moderate assistance was required for mobility and hygiene tasks, with consistent need for help in bathing and oral care; patient expressed desire to improve in order to go home.     Absence of Fall and Fall-Related Injury: Fall reduction program and bed/chair alarms were maintained throughout the shift, with hourly visual checks confirming the patient remained awake and in bed.

## 2024-03-22 LAB — CBC W/ AUTO DIFF
BASOPHILS ABSOLUTE COUNT: 0 10*9/L (ref 0.0–0.1)
BASOPHILS RELATIVE PERCENT: 1 %
EOSINOPHILS ABSOLUTE COUNT: 0.2 10*9/L (ref 0.0–0.5)
EOSINOPHILS RELATIVE PERCENT: 6.2 %
HEMATOCRIT: 36.4 % (ref 34.0–44.0)
HEMOGLOBIN: 12.1 g/dL (ref 11.3–14.9)
LYMPHOCYTES ABSOLUTE COUNT: 1.1 10*9/L (ref 1.1–3.6)
LYMPHOCYTES RELATIVE PERCENT: 32.2 %
MEAN CORPUSCULAR HEMOGLOBIN CONC: 33.3 g/dL (ref 32.0–36.0)
MEAN CORPUSCULAR HEMOGLOBIN: 33.3 pg — ABNORMAL HIGH (ref 25.9–32.4)
MEAN CORPUSCULAR VOLUME: 100.2 fL — ABNORMAL HIGH (ref 77.6–95.7)
MEAN PLATELET VOLUME: 7.5 fL (ref 6.8–10.7)
MONOCYTES ABSOLUTE COUNT: 0.3 10*9/L (ref 0.3–0.8)
MONOCYTES RELATIVE PERCENT: 9.8 %
NEUTROPHILS ABSOLUTE COUNT: 1.7 10*9/L — ABNORMAL LOW (ref 1.8–7.8)
NEUTROPHILS RELATIVE PERCENT: 50.8 %
NUCLEATED RED BLOOD CELLS: 0 /100{WBCs} (ref <=4)
PLATELET COUNT: 108 10*9/L — ABNORMAL LOW (ref 150–450)
RED BLOOD CELL COUNT: 3.64 10*12/L — ABNORMAL LOW (ref 3.95–5.13)
RED CELL DISTRIBUTION WIDTH: 19.3 % — ABNORMAL HIGH (ref 12.2–15.2)
WBC ADJUSTED: 3.3 10*9/L — ABNORMAL LOW (ref 3.6–11.2)

## 2024-03-22 LAB — COMPREHENSIVE METABOLIC PANEL
ALBUMIN: 2.4 g/dL — ABNORMAL LOW (ref 3.4–5.0)
ALKALINE PHOSPHATASE: 111 U/L (ref 46–116)
ALT (SGPT): 13 U/L (ref 10–49)
ANION GAP: 11 mmol/L (ref 5–14)
AST (SGOT): 32 U/L (ref <=34)
BILIRUBIN TOTAL: 1.2 mg/dL (ref 0.3–1.2)
BLOOD UREA NITROGEN: 6 mg/dL — ABNORMAL LOW (ref 9–23)
BUN / CREAT RATIO: 13
CALCIUM: 9 mg/dL (ref 8.7–10.4)
CHLORIDE: 108 mmol/L — ABNORMAL HIGH (ref 98–107)
CO2: 31.6 mmol/L — ABNORMAL HIGH (ref 20.0–31.0)
CREATININE: 0.47 mg/dL — ABNORMAL LOW (ref 0.55–1.02)
EGFR CKD-EPI (2021) FEMALE: >90 mL/min/1.73m2 (ref >=60)
GLUCOSE RANDOM: 90 mg/dL (ref 70–179)
POTASSIUM: 3.7 mmol/L (ref 3.4–4.8)
PROTEIN TOTAL: 5.5 g/dL — ABNORMAL LOW (ref 5.7–8.2)
SODIUM: 151 mmol/L — ABNORMAL HIGH (ref 135–145)

## 2024-03-22 LAB — PHOSPHORUS: PHOSPHORUS: 2.6 mg/dL (ref 2.4–5.1)

## 2024-03-22 LAB — LMWH ANTI-XA: HEPARIN LOW MOLECULAR WEIGHT: 0.71 [IU]/mL

## 2024-03-22 LAB — PROTIME-INR
INR: 1.17
PROTIME: 13.3 s — ABNORMAL HIGH (ref 9.9–12.6)

## 2024-03-22 LAB — MAGNESIUM: MAGNESIUM: 2.2 mg/dL (ref 1.6–2.6)

## 2024-03-22 MED ADMIN — folic acid (FOLVITE) tablet 1 mg: 1 mg | ORAL | @ 13:00:00

## 2024-03-22 MED ADMIN — sodium chloride (NS) 0.9 % flush 3 mL: 3 mL | INTRAVENOUS | @ 10:00:00

## 2024-03-22 MED ADMIN — magnesium oxide (MAG-OX) tablet 400 mg: 400 mg | ORAL | @ 13:00:00

## 2024-03-22 MED ADMIN — sodium chloride (NS) 0.9 % flush 3 mL: 3 mL | INTRAVENOUS | @ 01:00:00

## 2024-03-22 MED ADMIN — magnesium oxide (MAG-OX) tablet 400 mg: 400 mg | ORAL | @ 01:00:00

## 2024-03-22 MED ADMIN — multivitamins, therapeutic with minerals tablet 1 tablet: 1 | ORAL | @ 13:00:00

## 2024-03-22 MED ADMIN — enoxaparin (LOVENOX) syringe 70 mg: 70 mg | SUBCUTANEOUS | @ 01:00:00 | Stop: 2024-03-28

## 2024-03-22 MED ADMIN — sodium chloride (NS) 0.9 % flush 3 mL: 3 mL | INTRAVENOUS | @ 17:00:00

## 2024-03-22 MED ADMIN — cyanocobalamin (vitamin B-12) tablet 1,000 mcg: 1000 ug | ORAL | @ 13:00:00

## 2024-03-22 MED ADMIN — anastrozole (ARIMIDEX) tablet 1 mg: 1 mg | ORAL | @ 13:00:00

## 2024-03-22 MED ADMIN — carvedilol (COREG) tablet 3.125 mg: 3.125 mg | ORAL | @ 13:00:00

## 2024-03-22 MED ADMIN — empagliflozin (JARDIANCE) tablet 10 mg: 10 mg | ORAL | @ 13:00:00

## 2024-03-22 MED ADMIN — valsartan (DIOVAN) tablet 40 mg: 40 mg | ORAL | @ 13:00:00

## 2024-03-22 MED ADMIN — thiamine mononitrate (vit B1) tablet 100 mg: 100 mg | ORAL | @ 13:00:00

## 2024-03-22 MED ADMIN — pantoprazole (Protonix) EC tablet 40 mg: 40 mg | ORAL | @ 13:00:00

## 2024-03-22 MED ADMIN — enoxaparin (LOVENOX) syringe 70 mg: 70 mg | SUBCUTANEOUS | @ 12:00:00 | Stop: 2024-03-28

## 2024-03-22 MED ADMIN — carvedilol (COREG) tablet 3.125 mg: 3.125 mg | ORAL | @ 01:00:00

## 2024-03-22 MED ADMIN — spironolactone (ALDACTONE) tablet 25 mg: 25 mg | ORAL | @ 13:00:00

## 2024-03-22 NOTE — Unmapped (Signed)
 ADVANCE CARE PLANNING NOTE    COMPREHENSIVE GERIATRIC ASSESSMENT    In this note outline various components of a comprehensive geriatric assessment.  This includes a brief synopsis of patient's baseline level of function, living situation, social support and previously expressed or current goals for their medical care.  We used this information to guide the advanced care planning discussion that is described in detail above.    Assessment and Plan   [name] is a 78 y.o. female with a PMHx of a recent admission for acute decompensation of her heart failure, also has a recent diagnosis of atrial fibrillation and is on Eliquis, she has cirrhosis. She had an elected DCCV which was aborted due to LAA thromus and was difficult to arouse after sedation. There is mention of delirium during her MICU stay as well.     Mentation >> She is CAM negative. Her SLUMS today is 21/30. She has deficits in clock draw (just missed 1 point, overall clock was intact, the numbers were just asymmetrical), object and memory recall were also diminished. Overall, however, it seems like her functional status is intact, save for complex care issues - her daughter manages both her and husband's medications, does the cooking and manages some, not all, of the finances. Thus, I would consider this Mild Cognitive Impairment at this point. Will recommend follow cognitive testing at her primary care visits.   Mobility >> At home, prior to this prolonged hospital stay she was freely mobile, getting out of the house, dressing, bathing herself. Presently, she is needing a significant amount of assistance to mobilize. She will likely need skilled nursing for rehab  Medications >> She is not on any delirium risky medications  What Matters >> She would like to return home, return to her usual daily life with her family.     [name] lives in [place] with her husband, her daughter and grand daughter. She denies any problems at home, generally gets around without problems, gets out of the house. Her daughter does manages medications for both her and husband and takes care of some of the bill paying.          Mobility/Functional Assessment        ADLs:  IADLs:   Feeding: Independent  Dressing: Independent  Ambulation: Independent  Toileting: Independent  Bathing: Independent   Using the phone: Independent  Shopping: Independent  Meal preparation: Requires Assistance  Medication mgmt: Dependent  Managing finances: Dependent  Housework: Requires Assistance  Walk > 50 yards: Independent  Stairs: Solicitor (driving or navigating public transit): Independent     Living situation: Patient lives in own home with their children.     Changes in ADLs during hospitalization: decreased mobility, decreased ability to transfer.     Assistive devices: none     Additional services recommended at discharge: tbd    Vital Signs  BP 143/82  - Pulse 62  - Temp 35.6 ??C (96.1 ??F) (Temporal)  - Resp 18  - Ht 162.6 cm (5' 4.02)  - Wt 87.1 kg (192 lb 0.3 oz)  - SpO2 96%  - BMI 32.94 kg/m??      Exam  General: She is awake, attentive, does appear chronically ills, though not cachectic. She makes all appropriate social gestures and cues. She is very pleasant.        Mentation/Cognitive Assessment     See above     What Matters / Advance Care Planning  Patient has decisional capacity:  Yes    Health Care Decision-Maker  Patient has selected a Health Care Decision-Maker if loses capacity: Yes      HCDM (patient stated preference): [name],[name] - Spouse - [phone]    HCDM, [facility], [name] - Daughter - [phone]      Treatment Decisions:   Code Status: Full Code  The patient has completed ACP paperwork, including: HCPOA..       I spent between 46-75 minutes providing voluntary advance care planning services for this patient.

## 2024-03-22 NOTE — Unmapped (Signed)
 PM&R follow up note    Today at 1:40 PM, I performed a P2P with Dr. Lang of [facility] for consideration of AIR.     Insurance was unfortunately denied insurance believes that patient did not have enough complex medical or rehab needs that require AIR and that patient could go to SNF rehab.     I did argue that patient is at high risk for heart thrombosis and also bleeding hematochezia which required close monitoring of anticoagulation, anti Xa levels and Blood counts while on treatment dose lovenox with transition to back to treatment dose eliquis.     She has ongoing risk of delirium due to cirrhosis, hypernatremia and is at risk for recurrent infections and respiratory failure. Exacerbation of heart failure while on GDMT.    Patient can file an expedited appeal with the help of physician advisors or opt to go to a SNF for rehab.     Deward Adjutant, MD

## 2024-03-22 NOTE — Unmapped (Signed)
 Shift Summary  Bed alarm and fall reduction interventions were consistently maintained throughout the shift to support safety.  Skin protection and pressure reduction strategies were applied at regular intervals to maintain skin integrity.  Pain was assessed and remained at 0, with comfort measures in place.  Infection prevention protocols were followed, including cohorting and hand hygiene.  Overall, safety, comfort, and skin health were supported and no adverse events were documented during the shift.    Skin Health and Integrity: Skin and tissue health maintained throughout the shift with consistent use of absorbent pads, pressure protection, and pressure-redistributing devices; no changes in skin protection interventions were noted.    Absence of Hospital-Acquired Illness or Injury: Infection prevention measures such as cohorting and hand hygiene were consistently implemented during the shift.    Optimal Comfort and Wellbeing: Pain remained at 0 during the shift and comfort interventions were maintained.    Improved Ability to Complete Activities of Daily Living: Moderate assistance was required for hygiene and bathing, and feeding was self-managed with setup support throughout the shift.    Absence of Fall and Fall-Related Injury: Fall reduction strategies including bed alarms, hourly visual checks, and scheduled toileting were maintained, with no reported falls or injuries during the shift.

## 2024-03-22 NOTE — Unmapped (Signed)
 Shift Summary  Moisture associated dermatitis and bruising were present on skin folds and scattered areas, with peripheral IV sites remaining intact and requiring no intervention.   Fall reduction program and safety interventions were maintained throughout the shift.   Maximum assistance was required for mobility and hygiene, with some participation in activities of daily living.   Respiratory status and oxygen saturation remained stable and within defined limits.   No falls or fall-related injuries occurred during the shift, with all fall risk interventions in place.     Skin Health and Integrity: Skin folds noted to have moisture associated dermatitis and scattered bruising, with thin epidermis and loss of subcutaneous tissue; peripheral IV sites on left arm remained clean, dry, and intact with no intervention needed during the shift.     Absence of Hospital-Acquired Illness or Injury: No new hospital-acquired injuries documented; fall reduction program and safety interventions were maintained throughout the shift.     Improved Ability to Complete Activities of Daily Living: Maximum assistance required for mobility and hygiene, with patient able to participate in 25-49% of tasks and occasional walking noted.     Effective Breathing Pattern: Respiratory status remained within defined limits and oxygen saturation was stable at 96% during the shift.     Absence of Fall and Fall-Related Injury: Fall risk interventions including frequent toileting, hourly visual checks, and bed/chair alarms were consistently implemented, with no falls or injuries reported.

## 2024-03-22 NOTE — Unmapped (Signed)
 Geriatrics (MEDA) Progress Note    Assessment & Plan:   [name] is a 78 y.o. 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 Found to have acute hypercarbic respiratory failure, acute hepatic encephalopathy, and hypothermia following procedure, prompting intubation. Transferred to MICU for further management. While there, she was extubated and weaned to room air. She was down-graded to step down status and transferred to [facility] for further management of encephalopathy, E coli bacteremia, and anticoagulation therapy. She is currently stable and pending placement.    Principal Problem:    Bacteremia, escherichia coli  Active Problems:    Alcoholic liver disease (HHS-HCC)    HTN (hypertension)    Depression with anxiety    Class 2 severe obesity with serious comorbidity in adult    Atrial fibrillation    (CMS-HCC)    Pleural effusion    Hypernatremia    Thrombocytopenia    Cirrhosis    (CMS-HCC)    A-fib (CMS-HCC)    Hepatic encephalopathy    (CMS-HCC)      # Atrial Fibrillation - Left Atrial Appendage Thrombus (03/10/24)  New onset (03/2024), continued on home Coreg  3.125 mg BID. Started on Eliquis (cost-prohibitive due to not meeting deductible). Attempted TEE DCCV however aborted due to LAA thrombus. Have been unable to anticoagulate due to c/f GIB. Attempted to restart SQH ppx on 10/12-10/13, however patient had recurrent hematochezia and some light red secretions after extubation so all anticoagulation was held. Cardiology consulted and assisting with management. Patient tolerated SQH trial and heparin gtt x48 hours and has been transitioned to Lovenox BID.  - HOLD Eliquis 5 mg BID in setting of GIB  - Start Lovenox 1mg /kg BID 10/20. Continue for 1 week and transition to Eliquis.   - Daily CBC   - Monitor for Hematochezia  - Cardiology consulted, appreciate recommendations  - Outpatient repeat TEE to assess for LAA thrombus resolution.    # Chronic HFpEF  # HTN  On admission, pro-BNP 520, slightly elevated from 490 at previous discharge but improved from 846 on 02/29/24. CXR with interstitial edema and bilateral pulmonary effusions. GDMT initially held due to concern for acute infection, but regimen was gradually restarted as tolerated given her clinical improvement.  On physical exam, not demonstrating markers of  extreme volume overload at this time. Continuing to hold diuretics given patient is close to estimated dry weight, and her O2 sats and edema have been stable.  - Home GDMT  - BB: Coreg  3.125 mg BID, increase as needed. HR goal <110  - ARB/ACEi/ARNI: Increased Valsartan to 40 mg daily  - MRA: Spironolactone 25 mg daily  - SGLT2i: BEGIN Jardiance 10 mg daily  - HOLD Diuretics  - Daily Weights, EDW 195 lbs  - Strict I/Os  - Goal K >4, Mg >2  - Daily BMP, Mg    # Mild Cognitive Impairment  Comprehensive geriatric assessment completed on 03/22/2024 that showed mild cognitive improvement.  SLUMS was 21/30, though she is CAM negative.  Overall functional status is intact with the exception of complex care issues.  - Recommend follow-up cognitive testing with PCP    # Toxic Metabolic Encephalopathy, Improved  Patient presented for scheduled TEE/DCCV with Cardiology on 10/10, received 50 mg propofol  and found  to be difficult to wake following sedation with hypercarbic respiratory failure. Per chart review, initially poorly responsive to stimulation and started on NIPPV with BiPAP. Hypercarbia improved but remained altered eventually requiring intubation as below. CT Head 10/10 without acute intracranial process. Initial concern for hepatic encephalopathy has abated given lack of neurological symptoms and lactulose  was discontinued. Suspected likely multifactorial in the setting of hypercarbia, electrolyte abnormalities, infection. She is now improving and close to her baseline.  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 No hematemesis. Received variceal bleed ppx (PPI, ceftriaxone, and octreotide).  Low suspicion for variceal bleeding given HD stability and stable hemoglobin. Suspect related to colonic mass. Had recurrent hematochezia on 10/13 in the s/o challenge of subQ heparin, however has resolved since this was stopped. Per luminal GI 10/16, do not need CT GI bleed at this time as she is not actively bleeding. Plan to consult GI officially if bleeding begins. Hemoglobin has remained stable. Current plan is for outpatient colonoscopy for further workup, however may need to consider inpatient pending how current trial of subQ heparin goes.  - PO daily PPI  - Colonoscopy outpatient for incidental finding concerning for sigmoid colon malignancy     # MASLD Cirrhosis  # Hx Hepatic Encephalopathy  # Thrombocytopenia  History of decompensated metabolic-associated cirrhosis with HE, G1 EV and PHG on EGD 01/2018. Previously followed with hepatology but lost to follow up in 2020. Formerly prescribed lactulose  but has not been on it in several years. Presented altered and encephalopathic following sedation as above. Ammonia level elevated 192 -> 251 -> 82 from previous level of 30. Liver doppler US  negative for acute thrombosis. Had been receiving lactulose  enemas at OSH. Concern for development of GI bleed on 10/10 AM, GI consulted with low suspicion for variceal bleed, initially recommend placing NG tube for lactulose  administration to prevent further bleed. Most recently recommend stopping lactulose  given absence of asterixis and to avoid worsening electrolyte abnormalities and bowel urgency.   - Hepatology consulted, appreciate recommendations  - HOLD diuretics as above iso electrolyte abnormalities  - Daily MELD labs  - Follow up with GI outpatient    MELD 3.0: 12 at 03/22/2024  6:30 AM  MELD-Na: 9 at 03/22/2024  6:30 AM  Calculated from:  Serum Creatinine: 0.47 mg/dL (Using min of 1 mg/dL) at 89/78/7974  3:69 AM  Serum Sodium: 151 mmol/L (Using max of 137 mmol/L) at 03/22/2024  6:30 AM  Total Bilirubin: 1.2 mg/dL at 89/78/7974  3:69 AM  Serum Albumin: 2.4 g/dL at 89/78/7974  3:69 AM  INR(ratio): 1.17 at 03/22/2024  6:30 AM  Age at listing (hypothetical): 78 years  Sex: Female at 03/22/2024  6:30 AM    # Hypernatremia  Suspected iso poor po intake. S/p post-pyloric corpak placement. Currently with regular diet, though hypernatremia persists.   - Na level daily  - Encourage PO intake    Chronic Problems     # Vertebral Compression Fracture  T12 vertebral compression fracture with retropulsion into the ventral spinal canal on CT. Has known history of osteoporosis identified on Dexa scan of femoral neck (11/2022 T score -2.9).      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 Coli Bacteremia (Resolved)  Afebrile upon arrival to scheduled procedure via axillary temp, but found to be hypothermic to 34.1C via foley catheter. Otherwise, HDS without leukocytosis. Hypothermia improved with Bair Hugger. Eventually sedated and intubated as above for acute hypoxic respiratory failure and encephalopathy. No fluid pocket for diagnostic paracentesis on POCUS. CXR with interstitial edema and bilateral pulmonary effusions but no focal consolidation. UA noninfectious. Blood cultures 10/10 found to be positive for E. Coli. Required levophed briefly but remained stable after weaning off 10/12.  Completed treatment with Zosyn followed by Cefazolin  10/10-10/17.  Repeat blood cultures 10/12 were negative.  Unclear etiology though possible colonic source iso mass-like thickening involving the lower sigmoid colon (see below).    # Acute on Chronic Hypoxic and Hypercarbic Respiratory Failure (Resolved)  # Likely OSA  Presented with somnolence and difficulty to rouse s/p TEE/DCCV sedation as above. Transferred to CCU w/ pulm consult and placed on NIPPV. CXR with slight pulmonary edema s/p diuresis. Hypercarbia corrected on BiPAP then AVAPS from 86 > 58 but ultimately required intubation due to acute hypoxic respiratory failure from hypoventilation likely due to encephalopathy. Extubated to [place] on 10/13 and now stable on room air. Has been doing well on room air without need for additional oxygen supplementation overnight.         Mentation CAM: Overall CAM-ICU: Negative  6CIT:    Not yet completed (please touch base with nursing)  Delirium Order Set: Ordered (appropriate for any patient >65)  Dementia: No.  Behavioral Symptoms (baseline or now): No.     Mobility Baseline functional status: Independent in ADLs  Current functional status: Needs Assistance with ADLs  PT/OT Ordered: Recommend AIR   Medications Reviewed home medications, screening for potentially inappropriate medications: Yes  Medications recommended de-prescribing: n/a   What Matters Geri Assessment complete: Yes see CGA note 03/22/2024         Issues Impacting Complexity of Management:  -Intensive monitoring of drug toxicity from Zofran  with EKG to monitor QTc and cefazolin  with BMP  -High risk of complications from pain and/or analgesia likely to result in delirium  -The patient is at high risk from Hospital immobility in an elderly patient given baseline poor functional status with a high risk of causing delirium and further decline in function  -The patient is at high risk for the development of complications of volume overload due to the need to provide IV hydration for suspected hypovolemia in the setting of: heart failure and Cirrhosis  -Need for the following intensive monitoring parameter(s) due to high risk of clinical decline: frequent monitoring of urine output to assess for the efficacy of diuretic regimen  -The patient is at high risk of complications from delirium      Daily Checklist:  Diet: GI Softs  DVT PPx: Contraindicated - High Risk for Bleeding/Active Bleeding; however, patient is hypercoagulable  Electrolytes: Replete Potassium to >/=4 and Magnesium  to >/=2  Code Status: Full Code  Dispo: AIR    Team Contact Information:   Primary Team: Geriatrics ([facility])  Primary Resident: Lacinda Rimes, MD  Resident's Pager: [phone] (Geriatrics Intern - [place])    Interval History:   No acute events overnight.     Patient says she is doing well today. She does not have any pain or difficulty breathing at this time. She confirmed her daughter's phone number and said that we could update her.       Objective:  Temp:  [35.4 ??C (95.7 ??F)-35.6 ??C (96.1 ??F)] 35.6 ??C (96.1 ??F)  Pulse:  [62-73] 62  Resp:  [18] 18  BP: (143-153)/(76-82) 143/82  SpO2:  [96 %-97 %] 96 %    Gen: NAD, resting comfortably in bed  Eyes: sclera anicteric  HENT: MMM  Heart: RRR, well-perfused, no murmurs, rubs, or gallops  Lungs: Normal WOB on room air; diminished sounds at the bases bilaterally  Abdomen: soft, NTND; non-shifting LUQ edema  GU: Dark golden yellow urine in collecting tube  Extremities: minimal edema in the bilateral lower extremities  Psych: attends to examiner    Labs/Studies: Labs and Studies from the last 24hrs per EMR and Reviewed    Lacinda Rimes, MD  PGY-1, [facility] Internal Medicine

## 2024-03-22 NOTE — Unmapped (Addendum)
 Enoxaparin Therapeutic Monitoring Pharmacy Note    [name] is a 78 y.o. female starting enoxaparin.    Indication: Atrial Fibrillation - Left Atrial Appendage Thrombus (03/10/24)    Prior Dosing Information: Current regimen enoxaparin (LOVENOX) syringe 70 mg subcutaneous EVERY 12 HOURS    Goals:  Therapeutic Drug Levels  LMWH Anti-Xa level: 0.6-1 units/mL drawn 4-6 hours post-dose    Additional Clinical Monitoring/Outcomes  Monitor hemoglobin and platelets  Monitor for signs/symptoms of bleeding  Monitor renal function     Results:  LMWH Anti-Xa level 0.71 units/mL, drawn appropriately  Wt Readings from Last 3 Encounters:   03/22/24 87.1 kg (192 lb 0.3 oz)   03/07/24 89.3 kg (196 lb 12.8 oz)   03/03/24 89.1 kg (196 lb 6.4 oz)     HGB   Date Value Ref Range Status   03/22/2024 12.1 11.3 - 14.9 g/dL Final     Platelet   Date Value Ref Range Status   03/22/2024 108 (L) 150 - 450 10*9/L Final     Creatinine   Date Value Ref Range Status   03/22/2024 0.47 (L) 0.55 - 1.02 mg/dL Final       Pharmacokinetic Considerations and Significant Drug Interactions:   Concurrent antiplatelet medications: none identified    Assessment/Plan:   Recommendation(s)  Continue current regimen of enoxaparin (LOVENOX) syringe 70 mg (0.8 mg/kg total body weight) subcutaneous EVERY 12 HOURS. Dose reduction due to patients age and recent GI bleed    Follow-up  Level due: determined by primary team  A pharmacist will continue to monitor and recommend levels as appropriate      Please page service pharmacist with questions/clarifications.    Camellia Ester, RPh

## 2024-03-23 LAB — CBC W/ AUTO DIFF
BASOPHILS ABSOLUTE COUNT: 0 10*9/L (ref 0.0–0.1)
BASOPHILS RELATIVE PERCENT: 0.7 %
EOSINOPHILS ABSOLUTE COUNT: 0.2 10*9/L (ref 0.0–0.5)
EOSINOPHILS RELATIVE PERCENT: 5.8 %
HEMATOCRIT: 36.9 % (ref 34.0–44.0)
HEMOGLOBIN: 12.2 g/dL (ref 11.3–14.9)
LYMPHOCYTES ABSOLUTE COUNT: 1.1 10*9/L (ref 1.1–3.6)
LYMPHOCYTES RELATIVE PERCENT: 32.5 %
MEAN CORPUSCULAR HEMOGLOBIN CONC: 33.2 g/dL (ref 32.0–36.0)
MEAN CORPUSCULAR HEMOGLOBIN: 33.2 pg — ABNORMAL HIGH (ref 25.9–32.4)
MEAN CORPUSCULAR VOLUME: 100.2 fL — ABNORMAL HIGH (ref 77.6–95.7)
MEAN PLATELET VOLUME: 7.7 fL (ref 6.8–10.7)
MONOCYTES ABSOLUTE COUNT: 0.3 10*9/L (ref 0.3–0.8)
MONOCYTES RELATIVE PERCENT: 9.1 %
NEUTROPHILS ABSOLUTE COUNT: 1.7 10*9/L — ABNORMAL LOW (ref 1.8–7.8)
NEUTROPHILS RELATIVE PERCENT: 51.9 %
NUCLEATED RED BLOOD CELLS: 0 /100{WBCs} (ref <=4)
PLATELET COUNT: 115 10*9/L — ABNORMAL LOW (ref 150–450)
RED BLOOD CELL COUNT: 3.68 10*12/L — ABNORMAL LOW (ref 3.95–5.13)
RED CELL DISTRIBUTION WIDTH: 19.3 % — ABNORMAL HIGH (ref 12.2–15.2)
WBC ADJUSTED: 3.3 10*9/L — ABNORMAL LOW (ref 3.6–11.2)

## 2024-03-23 LAB — BASIC METABOLIC PANEL
ANION GAP: 11 mmol/L (ref 5–14)
BLOOD UREA NITROGEN: 7 mg/dL — ABNORMAL LOW (ref 9–23)
BUN / CREAT RATIO: 14
CALCIUM: 8.9 mg/dL (ref 8.7–10.4)
CHLORIDE: 109 mmol/L — ABNORMAL HIGH (ref 98–107)
CO2: 32.6 mmol/L — ABNORMAL HIGH (ref 20.0–31.0)
CREATININE: 0.5 mg/dL — ABNORMAL LOW (ref 0.55–1.02)
EGFR CKD-EPI (2021) FEMALE: >90 mL/min/1.73m2 (ref >=60)
GLUCOSE RANDOM: 93 mg/dL (ref 70–179)
POTASSIUM: 3.6 mmol/L (ref 3.4–4.8)
SODIUM: 153 mmol/L — ABNORMAL HIGH (ref 135–145)

## 2024-03-23 LAB — MAGNESIUM: MAGNESIUM: 2.2 mg/dL (ref 1.6–2.6)

## 2024-03-23 LAB — PHOSPHORUS: PHOSPHORUS: 2.5 mg/dL (ref 2.4–5.1)

## 2024-03-23 LAB — OSMOLALITY, SERUM: OSMOLALITY MEASURED: 309 mosm/kg — ABNORMAL HIGH (ref 275–295)

## 2024-03-23 MED ADMIN — valsartan (DIOVAN) tablet 40 mg: 40 mg | ORAL | @ 13:00:00

## 2024-03-23 MED ADMIN — folic acid (FOLVITE) tablet 1 mg: 1 mg | ORAL | @ 13:00:00

## 2024-03-23 MED ADMIN — thiamine mononitrate (vit B1) tablet 100 mg: 100 mg | ORAL | @ 13:00:00

## 2024-03-23 MED ADMIN — carvedilol (COREG) tablet 3.125 mg: 3.125 mg | ORAL | @ 01:00:00

## 2024-03-23 MED ADMIN — acetaminophen (TYLENOL) tablet 650 mg: 650 mg | ORAL | @ 06:00:00

## 2024-03-23 MED ADMIN — anastrozole (ARIMIDEX) tablet 1 mg: 1 mg | ORAL | @ 13:00:00

## 2024-03-23 MED ADMIN — empagliflozin (JARDIANCE) tablet 10 mg: 10 mg | ORAL | @ 13:00:00

## 2024-03-23 MED ADMIN — sodium chloride (NS) 0.9 % flush 3 mL: 3 mL | INTRAVENOUS | @ 16:00:00

## 2024-03-23 MED ADMIN — sodium chloride (NS) 0.9 % flush 3 mL: 3 mL | INTRAVENOUS | @ 01:00:00

## 2024-03-23 MED ADMIN — cyanocobalamin (vitamin B-12) tablet 1,000 mcg: 1000 ug | ORAL | @ 13:00:00

## 2024-03-23 MED ADMIN — pantoprazole (Protonix) EC tablet 40 mg: 40 mg | ORAL | @ 13:00:00

## 2024-03-23 MED ADMIN — magnesium oxide (MAG-OX) tablet 400 mg: 400 mg | ORAL | @ 01:00:00

## 2024-03-23 MED ADMIN — sodium chloride (NS) 0.9 % flush 3 mL: 3 mL | INTRAVENOUS | @ 10:00:00

## 2024-03-23 MED ADMIN — enoxaparin (LOVENOX) syringe 70 mg: 70 mg | SUBCUTANEOUS | @ 13:00:00 | Stop: 2024-03-28

## 2024-03-23 MED ADMIN — spironolactone (ALDACTONE) tablet 25 mg: 25 mg | ORAL | @ 13:00:00

## 2024-03-23 MED ADMIN — enoxaparin (LOVENOX) syringe 70 mg: 70 mg | SUBCUTANEOUS | @ 01:00:00 | Stop: 2024-03-28

## 2024-03-23 MED ADMIN — carvedilol (COREG) tablet 3.125 mg: 3.125 mg | ORAL | @ 13:00:00

## 2024-03-23 MED ADMIN — multivitamins, therapeutic with minerals tablet 1 tablet: 1 | ORAL | @ 13:00:00

## 2024-03-23 MED ADMIN — magnesium oxide (MAG-OX) tablet 400 mg: 400 mg | ORAL | @ 13:00:00

## 2024-03-23 NOTE — Unmapped (Signed)
 Shift Summary  CBC w/ Differential and metabolic panel revealed several abnormal values, including low WBC, RBC, platelets, and high sodium and osmolality.   Skin assessment identified scattered bruising and moisture-associated dermatitis in skin folds, with interventions focused on frequent repositioning and pressure reduction.   Fall prevention and safety interventions were maintained throughout the shift, including bed alarms, side rails, and scheduled toileting.   Patient received routine spiritual care and expressed appreciation for the support provided.   Patient remained stable with no new injuries, pain, or significant changes in ability to participate in activities of daily living during the shift.    Skin Health and Integrity: Skin integrity was supported with frequent repositioning, use of pillows, and pressure reduction techniques; however, scattered bruising and moisture-associated dermatitis in skin folds were present throughout the shift and skin remained thin with loss of subcutaneous tissue.    Absence of Hospital-Acquired Illness or Injury: No new hospital-acquired injuries were documented, and fall prevention interventions such as bed alarms, side rails, and scheduled toileting were consistently maintained during the shift.    Optimal Comfort and Wellbeing: Pain was consistently reported as 0/10, and no pain interventions were required during the shift.    Readiness for Transition of Care: IRF Candidacy Score remained stable at 85 throughout the shift, and unplanned readmission score showed minimal change; patient received routine spiritual support and expressed appreciation for the chaplain visit.    Improved Ability to Complete Activities of Daily Living: Mobility was slightly limited but patient was able to walk occasionally, and repositioning was performed every two hours to support activity and safety.

## 2024-03-23 NOTE — Unmapped (Signed)
 Geriatrics (MEDA) Progress Note    Assessment & Plan:   [name] is a 78 y.o. 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 Found to have acute hypercarbic respiratory failure, acute hepatic encephalopathy, and hypothermia following procedure, prompting intubation. Transferred to MICU for further management. While there, she was extubated and weaned to room air. She was down-graded to step down status and transferred to [facility] for further management of encephalopathy, E coli bacteremia, and anticoagulation therapy. She is currently stable and pending placement.    Principal Problem:    Bacteremia, escherichia coli  Active Problems:    Alcoholic liver disease (HHS-HCC)    HTN (hypertension)    Depression with anxiety    Class 2 severe obesity with serious comorbidity in adult    Atrial fibrillation    (CMS-HCC)    Pleural effusion    Hypernatremia    Thrombocytopenia    Cirrhosis    (CMS-HCC)    A-fib (CMS-HCC)    Hepatic encephalopathy    (CMS-HCC)      # Atrial Fibrillation - Left Atrial Appendage Thrombus (03/10/24)  New onset (03/2024), continued on home Coreg  3.125 mg BID. Started on Eliquis (cost-prohibitive due to not meeting deductible). Attempted TEE DCCV however aborted due to LAA thrombus. Have been unable to anticoagulate due to c/f GIB. Attempted to restart SQH ppx on 10/12-10/13, however patient had recurrent hematochezia and some light red secretions after extubation so all anticoagulation was held. Patient tolerated SQH trial and heparin gtt x48 hours and has been transitioned to Lovenox BID.  - HOLD Eliquis 5 mg BID in setting of GIB  - Continue Lovenox 1mg /kg BID 10/20. Continue for 1 week and transition to Eliquis.   - Daily CBC   - Monitor for Hematochezia  - Cardiology consulted, appreciate recommendations  - Outpatient repeat TEE to assess for LAA thrombus resolution.    # Chronic HFpEF  # HTN  On admission, pro-BNP 520, slightly elevated from 490 at previous discharge but improved from 846 on 02/29/24. CXR with interstitial edema and bilateral pulmonary effusions. GDMT initially held due to concern for acute infection, but regimen was gradually restarted as tolerated given her clinical improvement.  On physical exam, not demonstrating markers of  extreme volume overload at this time. Continuing to hold diuretics given patient is close to estimated dry weight, and her O2 sats and edema have been stable.  - Home GDMT  - BB: Coreg  3.125 mg BID, increase as needed. HR goal <110  - ARB/ACEi/ARNI: Increased Valsartan to 40 mg daily  - MRA: Spironolactone 25 mg daily  - SGLT2i: BEGIN Jardiance 10 mg daily  - HOLD Diuretics  - Daily Weights, EDW 195 lbs  - Strict I/Os  - Goal K >4, Mg >2  - Daily BMP, Mg    # Hypernatremia  Suspected iso poor po intake. S/p post-pyloric corpak placement. Currently with regular diet, though hypernatremia persists.  Most recently 153 on 10/22.  Serum Osms elevated at 309.  Given the increasing hypernatremia, plan to monitor closely.  Uncertain etiology at this time, but we will continue to pursue additional studies.  - Urine Na, Osm pending   - Can consider D5W pending lab results  - Na level daily  -  Encourage PO intake  - Strict I/Os    # Mild Cognitive Impairment  Comprehensive geriatric assessment completed on 03/22/2024 that showed mild cognitive improvement.  SLUMS was 21/30, though she is CAM negative.  Overall functional status is intact with the exception of complex care issues.  - Recommend follow-up cognitive testing with PCP    # Toxic Metabolic Encephalopathy, Improved  Patient presented for scheduled TEE/DCCV with Cardiology on 10/10, received 50 mg propofol  and found to be difficult to wake following sedation with hypercarbic respiratory failure. Per chart review, initially poorly responsive to stimulation and started on NIPPV with BiPAP. Hypercarbia improved but remained altered eventually requiring intubation as below. CT Head 10/10 without acute intracranial process. Initial concern for hepatic encephalopathy has abated given lack of neurological symptoms and lactulose  was discontinued. Suspected likely multifactorial in the setting of hypercarbia, electrolyte abnormalities, infection. She is now improved and close to her baseline.  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 No hematemesis. Received variceal bleed ppx (PPI, ceftriaxone, and octreotide).  Low suspicion for variceal bleeding given HD stability and stable hemoglobin. Suspect related to colonic mass. Had recurrent hematochezia on 10/13 in the s/o challenge of subQ heparin, however has resolved since this was stopped. Per luminal GI 10/16, do not need CT GI bleed at this time as she is not actively bleeding. Plan to consult GI officially if bleeding begins. Hemoglobin has remained stable. Current plan is for outpatient colonoscopy for further workup, however may need to consider inpatient pending how current trial of subQ heparin goes.  - PO daily PPI  - Colonoscopy outpatient for incidental finding concerning for sigmoid colon malignancy     # MASLD Cirrhosis  # Hx Hepatic Encephalopathy  # Thrombocytopenia  History of decompensated metabolic-associated cirrhosis with HE, G1 EV and PHG on EGD 01/2018. Previously followed with hepatology but lost to follow up in 2020. Formerly prescribed lactulose  but has not been on it in several years. Presented altered and encephalopathic following sedation as above. Ammonia level elevated 192 -> 251 -> 82 from previous level of 30. Liver doppler US  negative for acute thrombosis. Had been receiving lactulose  enemas at OSH. Concern for development of GI bleed on 10/10 AM, GI consulted with low suspicion for variceal bleed, initially recommend placing NG tube for lactulose  administration to prevent further bleed. Most recently recommend stopping lactulose  given absence of asterixis and to avoid worsening electrolyte abnormalities and bowel urgency.   - Hepatology consulted, appreciate recommendations  - HOLD diuretics as above iso electrolyte abnormalities  - Daily MELD labs  - Follow up with GI outpatient    MELD 3.0: 12 at 03/23/2024  7:05 AM  MELD-Na: 9 at 03/23/2024  7:05 AM  Calculated from:  Serum Creatinine: 0.5 mg/dL (Using min of 1 mg/dL) at 89/77/7974  2:94 AM  Serum Sodium: 153 mmol/L (Using max of 137 mmol/L) at 03/23/2024  7:05 AM  Total Bilirubin: 1.2 mg/dL at 89/78/7974  3:69 AM  Serum Albumin: 2.4 g/dL at 89/78/7974  3:69 AM  INR(ratio): 1.17 at 03/22/2024  6:30 AM  Age at listing (hypothetical): 78 years  Sex: Female at 03/23/2024  7:05 AM    Chronic Problems     # Vertebral Compression Fracture  T12 vertebral compression fracture with retropulsion into the ventral  spinal canal on CT. Has known history of osteoporosis identified on Dexa scan of femoral neck (11/2022 T score -2.9).      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 Coli Bacteremia (Resolved)  Afebrile upon arrival to scheduled procedure via axillary temp, but found to be hypothermic to 34.1C via foley catheter. Otherwise, HDS without leukocytosis. Hypothermia improved with Bair Hugger. Eventually sedated and intubated as above for acute hypoxic respiratory failure and encephalopathy. No fluid pocket for diagnostic paracentesis on POCUS. CXR with interstitial edema and bilateral pulmonary effusions but no focal consolidation. UA noninfectious. Blood cultures 10/10 found to be positive for E. Coli. Required levophed briefly but remained stable after weaning off 10/12.  Completed treatment with Zosyn followed by Cefazolin  10/10-10/17.  Repeat blood cultures 10/12 were negative.  Unclear etiology though possible colonic source iso mass-like thickening involving the lower sigmoid colon (see below).    # Acute on Chronic Hypoxic and Hypercarbic Respiratory Failure (Resolved)  # Likely OSA  Presented with somnolence and difficulty to rouse s/p TEE/DCCV sedation as above. Transferred to CCU w/ pulm consult and placed on NIPPV. CXR with slight pulmonary edema s/p diuresis. Hypercarbia corrected on BiPAP then AVAPS from 86 > 58 but ultimately required intubation due to acute hypoxic respiratory failure from hypoventilation likely due to encephalopathy. Extubated to [place] on 10/13 and now stable on room air. Has been doing well on room air without need for additional oxygen supplementation overnight.         Mentation CAM: Overall CAM-ICU: Negative  6CIT:    Not yet completed (please touch base with nursing)  Delirium Order Set: Ordered (appropriate for any patient >65)  Dementia: No.  Behavioral Symptoms (baseline or now): No.     Mobility Baseline functional status: Independent in ADLs  Current functional status: Needs Assistance with ADLs  PT/OT Ordered: Recommend AIR   Medications Reviewed home medications, screening for potentially inappropriate medications: Yes  Medications recommended de-prescribing: n/a   What Matters Geri Assessment complete: Yes see CGA note 03/22/2024         Issues Impacting Complexity of Management:  -Intensive monitoring of drug toxicity from Zofran  with EKG to monitor QTc and cefazolin  with BMP  -High risk of complications from pain and/or analgesia likely to result in delirium  -The patient is at high risk from Hospital immobility in an elderly patient given baseline poor functional status with a high risk of causing delirium and further decline in function  -The patient is at high risk for the development of complications of volume overload due to the need to provide IV hydration for suspected hypovolemia in the setting of: heart failure and Cirrhosis  -Need for the following intensive monitoring parameter(s) due to high risk of clinical decline: frequent monitoring of urine output to assess for the efficacy of diuretic regimen  -The patient is at high risk of complications from delirium      Daily Checklist:  Diet: GI Softs  DVT PPx: Contraindicated - High Risk for Bleeding/Active Bleeding; however, patient is hypercoagulable  Electrolytes: Replete Potassium to >/=4 and Magnesium  to >/=2  Code Status: Full Code  Dispo: AIR    Team Contact Information:   Primary Team: Geriatrics ([facility]A)  Primary Resident: Lacinda Rimes, MD  Resident's Pager: [phone] (Geriatrics Intern - [place])    Interval History:  No acute events overnight.     Patient says that she is okay today but she has had less sleep.  She says that her husband is back in the hospital after having had a fall and she is worried about him.  We discussed this and his concerns.  She has been able to eat and drink okay.  She is eating a little bit less now, but is working on drinking more water  instead of ginger ale.  Last bowel movement was 10/20.      Objective:   Temp:  [36.2 ??C (97.2 ??F)-36.3 ??C (97.3 ??F)] 36.3 ??C (97.3 ??F)  Pulse:  [64-76] 72  Resp:  [16-18] 18  BP: (146-153)/(62-79) 148/72  SpO2:  [94 %-96 %] 95 %    Gen: NAD, resting comfortably in bed  Eyes: sclera anicteric  HENT: MMM  Heart: RRR, well-perfused, no murmurs, rubs, or gallops  Lungs: Normal WOB on room air; diminished sounds at the bases bilaterally  Abdomen: soft, NTND; non-shifting LUQ edema  GU: Dark golden yellow urine in collecting tube  Extremities: Moderate edema in the bilateral lower extremities foot to knee  Psych: attends to examiner    Labs/Studies: Labs and Studies from the last 24hrs per EMR and Reviewed    Lacinda Rimes, MD  PGY-1, [facility] Internal Medicine

## 2024-03-23 NOTE — Unmapped (Signed)
 Shift Summary  Acetaminophen  was administered PRN at 2:07 AM for comfort management.  Anti-embolism measures, including lovenox, were consistently documented.  Braden Scale score and sensory perception remained stable, with frequent repositioning and pressure reduction techniques applied.  Bed alarm and fall reduction interventions were maintained, and hourly visual checks confirmed patient remained in bed without incident.  Patient remained stable with no new hospital-acquired injuries or falls documented during the shift.    Skin Health and Integrity: Frequent weight shifts, heel elevation, and regular repositioning were maintained throughout the shift, with Braden Scale score remaining stable at 19 and no impairment in sensory perception noted.    Absence of Hospital-Acquired Illness or Injury: No new hospital-acquired injuries or illnesses were documented during the shift, and anti-embolism measures such as lovenox were consistently noted in PACU / Recovery Interventions documentation.    Optimal Comfort and Wellbeing: Pain remained at 0 throughout the shift, and acetaminophen  was administered PRN early morning; patient was able to feed herself at all times.    Improved Ability to Complete Activities of Daily Living: Maximum assistance was required for hygiene and bathing, while oral care was performed independently and feeding was self-managed; no change in level of assistance was observed during the shift.    Absence of Fall and Fall-Related Injury: Bed alarm and fall reduction interventions were maintained at all times, with hourly visual checks confirming patient remained in bed and no falls or injuries were documented.

## 2024-03-24 LAB — MAGNESIUM: MAGNESIUM: 2.3 mg/dL (ref 1.6–2.6)

## 2024-03-24 LAB — PHOSPHORUS: PHOSPHORUS: 2.3 mg/dL — ABNORMAL LOW (ref 2.4–5.1)

## 2024-03-24 LAB — BASIC METABOLIC PANEL
ANION GAP: 13 mmol/L (ref 5–14)
BLOOD UREA NITROGEN: 7 mg/dL — ABNORMAL LOW (ref 9–23)
BUN / CREAT RATIO: 14
CALCIUM: 9.2 mg/dL (ref 8.7–10.4)
CHLORIDE: 108 mmol/L — ABNORMAL HIGH (ref 98–107)
CO2: 32.3 mmol/L — ABNORMAL HIGH (ref 20.0–31.0)
CREATININE: 0.49 mg/dL — ABNORMAL LOW (ref 0.55–1.02)
EGFR CKD-EPI (2021) FEMALE: >90 mL/min/1.73m2 (ref >=60)
GLUCOSE RANDOM: 106 mg/dL (ref 70–179)
POTASSIUM: 3.7 mmol/L (ref 3.4–4.8)
SODIUM: 153 mmol/L — ABNORMAL HIGH (ref 135–145)

## 2024-03-24 LAB — CBC W/ AUTO DIFF
BASOPHILS ABSOLUTE COUNT: 0 10*9/L (ref 0.0–0.1)
BASOPHILS RELATIVE PERCENT: 0.4 %
EOSINOPHILS ABSOLUTE COUNT: 0.1 10*9/L (ref 0.0–0.5)
EOSINOPHILS RELATIVE PERCENT: 3.8 %
HEMATOCRIT: 36.6 % (ref 34.0–44.0)
HEMOGLOBIN: 12.3 g/dL (ref 11.3–14.9)
LYMPHOCYTES ABSOLUTE COUNT: 0.9 10*9/L — ABNORMAL LOW (ref 1.1–3.6)
LYMPHOCYTES RELATIVE PERCENT: 26.6 %
MEAN CORPUSCULAR HEMOGLOBIN CONC: 33.5 g/dL (ref 32.0–36.0)
MEAN CORPUSCULAR HEMOGLOBIN: 33.6 pg — ABNORMAL HIGH (ref 25.9–32.4)
MEAN CORPUSCULAR VOLUME: 100.3 fL — ABNORMAL HIGH (ref 77.6–95.7)
MEAN PLATELET VOLUME: 7.7 fL (ref 6.8–10.7)
MONOCYTES ABSOLUTE COUNT: 0.3 10*9/L (ref 0.3–0.8)
MONOCYTES RELATIVE PERCENT: 8.8 %
NEUTROPHILS ABSOLUTE COUNT: 2 10*9/L (ref 1.8–7.8)
NEUTROPHILS RELATIVE PERCENT: 60.4 %
NUCLEATED RED BLOOD CELLS: 0 /100{WBCs} (ref <=4)
PLATELET COUNT: 107 10*9/L — ABNORMAL LOW (ref 150–450)
RED BLOOD CELL COUNT: 3.65 10*12/L — ABNORMAL LOW (ref 3.95–5.13)
RED CELL DISTRIBUTION WIDTH: 19.7 % — ABNORMAL HIGH (ref 12.2–15.2)
WBC ADJUSTED: 3.2 10*9/L — ABNORMAL LOW (ref 3.6–11.2)

## 2024-03-24 LAB — SODIUM, URINE, RANDOM: SODIUM URINE: 35 mmol/L

## 2024-03-24 LAB — OSMOLALITY, RANDOM URINE: OSMOLALITY URINE: 496 mosm/kg

## 2024-03-24 MED ADMIN — gadopiclenol (ELUCIREM,VUEWAY) injection 7.5 mL: 7.5 mL | INTRAVENOUS | @ 21:00:00 | Stop: 2024-03-24

## 2024-03-24 MED ADMIN — magnesium oxide (MAG-OX) tablet 400 mg: 400 mg | ORAL | @ 01:00:00

## 2024-03-24 MED ADMIN — acetaminophen (TYLENOL) tablet 650 mg: 650 mg | ORAL | @ 04:00:00

## 2024-03-24 MED ADMIN — enoxaparin (LOVENOX) syringe 70 mg: 70 mg | SUBCUTANEOUS | @ 13:00:00 | Stop: 2024-03-28

## 2024-03-24 MED ADMIN — sodium chloride (NS) 0.9 % flush 3 mL: 3 mL | INTRAVENOUS | @ 11:00:00

## 2024-03-24 MED ADMIN — carvedilol (COREG) tablet 3.125 mg: 3.125 mg | ORAL | @ 01:00:00

## 2024-03-24 MED ADMIN — enoxaparin (LOVENOX) syringe 70 mg: 70 mg | SUBCUTANEOUS | @ 01:00:00 | Stop: 2024-03-28

## 2024-03-24 MED ADMIN — sodium chloride (NS) 0.9 % flush 3 mL: 3 mL | INTRAVENOUS | @ 01:00:00

## 2024-03-24 MED ADMIN — pantoprazole (Protonix) EC tablet 40 mg: 40 mg | ORAL | @ 13:00:00

## 2024-03-24 MED ADMIN — polyethylene glycol (MIRALAX) packet 17 g: 17 g | ORAL | @ 13:00:00 | Stop: 2024-03-24

## 2024-03-24 MED ADMIN — anastrozole (ARIMIDEX) tablet 1 mg: 1 mg | ORAL | @ 13:00:00

## 2024-03-24 MED ADMIN — magnesium oxide (MAG-OX) tablet 400 mg: 400 mg | ORAL | @ 13:00:00

## 2024-03-24 MED ADMIN — spironolactone (ALDACTONE) tablet 25 mg: 25 mg | ORAL | @ 13:00:00

## 2024-03-24 MED ADMIN — folic acid (FOLVITE) tablet 1 mg: 1 mg | ORAL | @ 13:00:00

## 2024-03-24 MED ADMIN — thiamine mononitrate (vit B1) tablet 100 mg: 100 mg | ORAL | @ 13:00:00

## 2024-03-24 MED ADMIN — carvedilol (COREG) tablet 3.125 mg: 3.125 mg | ORAL | @ 13:00:00

## 2024-03-24 MED ADMIN — valsartan (DIOVAN) tablet 40 mg: 40 mg | ORAL | @ 13:00:00

## 2024-03-24 MED ADMIN — sodium chloride (NS) 0.9 % flush 3 mL: 3 mL | INTRAVENOUS | @ 15:00:00

## 2024-03-24 MED ADMIN — cyanocobalamin (vitamin B-12) tablet 1,000 mcg: 1000 ug | ORAL | @ 13:00:00

## 2024-03-24 MED ADMIN — empagliflozin (JARDIANCE) tablet 10 mg: 10 mg | ORAL | @ 13:00:00

## 2024-03-24 MED ADMIN — multivitamins, therapeutic with minerals tablet 1 tablet: 1 | ORAL | @ 13:00:00

## 2024-03-24 NOTE — Unmapped (Addendum)
 Shift Summary  Headache pain was managed with acetaminophen  and patient was later observed sleeping after intervention.  Frequent repositioning and pressure reduction techniques were maintained to support skin integrity.  Fall prevention strategies, including bed alarms and hourly visual checks, were consistently implemented throughout the shift.  Increased confusion this morning at 5am; MD to bedside to assess.  STAT CT of head ordered and completed.  Patient remained stable with no new hospital-acquired injuries or falls documented during the shift.  Report given to on-coming RN.    Skin Health and Integrity: Braden Scale score remained at 19 and frequent weight shifts and repositioning were maintained throughout the shift, with nutrition documented as adequate and no changes in skin integrity noted.    Absence of Hospital-Acquired Illness or Injury: No new hospital-acquired injuries or illnesses were documented during the shift; safety interventions and bed/chair alarms were consistently in place.    Optimal Comfort and Wellbeing: Headache pain was reported as constant and rated 5/10, with acetaminophen  administered and patient later observed sleeping after intervention.    Improved Ability to Complete Activities of Daily Living: Independence in bathing, oral care, and general hygiene was maintained throughout the shift.    Absence of Fall and Fall-Related Injury: Bed alarm and fall reduction program were maintained, hourly visual checks confirmed patient remained in bed, and no falls or injuries were documented.

## 2024-03-24 NOTE — Unmapped (Signed)
 Geriatrics (MEDA) Progress Note    Assessment & Plan:   [name] is a 78 y.o. 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 Found to have acute hypercarbic respiratory failure, acute hepatic encephalopathy, and hypothermia following procedure, prompting intubation. Transferred to MICU for further management. While there, she was extubated and weaned to room air. She was down-graded to step down status and transferred to [facility] for further management of encephalopathy, E coli bacteremia, and anticoagulation therapy. She is currently stable and pending placement.    Principal Problem:    Bacteremia, escherichia coli  Active Problems:    Alcoholic liver disease (HHS-HCC)    HTN (hypertension)    Depression with anxiety    Class 2 severe obesity with serious comorbidity in adult    Atrial fibrillation    (CMS-HCC)    Pleural effusion    Hypernatremia    Thrombocytopenia    Cirrhosis    (CMS-HCC)    A-fib (CMS-HCC)    Hepatic encephalopathy    (CMS-HCC)      # Confusion iso New Changes on CT Head  Early on 10/23, overnight nurse was concerned for altered mentation. Patient was at her baseline during pre-rounding and rounds. No acute neurological changes on exam. CT Head 10/23 showed chronic microvascular changes with two new right frontoparietal hypoattenuations. At this time, most suspicious that recent confusion was likely 2/2 confusion/delirium, but cannot fully rule out CVA vs electrolyte abnormalities. Given the patient's history of thrombus and malignancy, will continue with MRI Brain.   - MRI Brain 10/23 pending  - Continue delirium precautions    # Hypernatremia  Suspected iso poor po intake. S/p post-pyloric corpak placement. Currently with regular diet, though hypernatremia persists.  Most recently 153 on 10/22.  Serum Osms elevated at 309.  Given the increasing hypernatremia, plan to monitor closely.  Uncertain etiology at this time, but we will continue to pursue additional studies.  - Urine Na, Osm pending   - Can consider D5W pending lab results  - Na level daily  - Encourage PO intake  - Strict I/Os    # Constipation  Last bowel movement 10/20. Increased bowel regimen 10/23.  - Senna BID  - MiraLax BID    # Atrial Fibrillation - Left Atrial Appendage Thrombus (03/10/24)  New onset (03/2024), continued on home Coreg  3.125 mg BID. Started on Eliquis (cost-prohibitive due to not meeting deductible). Attempted TEE DCCV however aborted due to LAA thrombus. Have been unable to anticoagulate due to c/f GIB. Attempted to restart SQH ppx on 10/12-10/13, however patient had recurrent hematochezia and some light red secretions after extubation so all anticoagulation was held. Patient tolerated SQH trial and heparin gtt x48 hours and has been transitioned to Lovenox BID.  - HOLD Eliquis 5 mg BID in setting of GIB  - Continue Lovenox 1mg /kg BID 10/20. Continue for 1 week and transition to Eliquis.   - Daily CBC   - Monitor for Hematochezia  - Cardiology consulted, appreciate recommendations  - Outpatient repeat TEE to assess for LAA thrombus resolution.    # Chronic HFpEF  # HTN  On admission, pro-BNP 520, slightly elevated from 490 at previous discharge but improved from 846 on 02/29/24. CXR 10/12 with interstitial edema and bilateral  pulmonary effusions. GDMT initially held due to concern for acute infection, but regimen was gradually restarted as tolerated given her clinical improvement.  On physical exam, not demonstrating markers of  extreme volume overload at this time. Continuing to hold diuretics given patient is close to estimated dry weight, and her O2 sats and edema have been stable.  - Home GDMT  - BB: Coreg  3.125 mg BID, increase as needed. HR goal <110  - ARB/ACEi/ARNI: Increased Valsartan to 40 mg daily  - MRA: Spironolactone 25 mg daily  - SGLT2i: BEGIN Jardiance 10 mg daily  - HOLD Diuretics  - Daily Weights, EDW 195 lbs  - Strict I/Os  - Goal K >4, Mg >2  - Daily BMP, Mg  - Consider CXR to evaluate for fluid.     # Mild Cognitive Impairment  Comprehensive geriatric assessment completed on 03/22/2024 that showed mild cognitive improvement.  SLUMS was 21/30, though she is CAM negative.  Overall functional status is intact with the exception of complex care issues.  - Recommend follow-up cognitive testing with PCP    # Toxic Metabolic Encephalopathy, Improved  Patient presented for scheduled TEE/DCCV with Cardiology on 10/10, received 50 mg propofol  and found to be difficult to wake following sedation with hypercarbic respiratory failure. Per chart review, initially poorly responsive to stimulation and started on NIPPV with BiPAP. Hypercarbia improved but remained altered eventually requiring intubation as below. CT Head 10/10 without acute intracranial process. Initial concern for hepatic encephalopathy has abated given lack of neurological symptoms and lactulose  was discontinued. Suspected likely multifactorial in the setting of hypercarbia, electrolyte abnormalities, infection. She is now improved and close to her baseline.  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 No hematemesis. Received variceal bleed ppx (PPI, ceftriaxone, and octreotide).  Low suspicion for variceal bleeding given HD stability and stable hemoglobin. Suspect related to colonic mass. Had recurrent hematochezia on 10/13 in the s/o challenge of subQ heparin, however has resolved since this was stopped. Per luminal GI 10/16, do not need CT GI bleed at this time as she is not actively bleeding. Plan to consult GI officially if bleeding begins. Hemoglobin has remained stable. Current plan is for outpatient colonoscopy for further workup, however may need to consider inpatient pending how current trial of subQ heparin goes.  - PO daily PPI  - Colonoscopy outpatient for incidental finding concerning for sigmoid colon malignancy     # MASLD Cirrhosis  # Hx Hepatic Encephalopathy  # Thrombocytopenia  History of decompensated metabolic-associated cirrhosis with HE, G1 EV and PHG on EGD 01/2018. Previously followed with hepatology but lost to follow up in 2020. Formerly prescribed lactulose  but has not been on it in several years. Presented altered and encephalopathic following sedation as above. Ammonia level elevated 192 -> 251 -> 82 from previous level of 30. Liver doppler US  negative for acute thrombosis. Had been receiving lactulose  enemas at OSH. Concern for development of GI bleed on 10/10 AM, GI consulted with low suspicion for variceal bleed, initially recommend placing NG tube for lactulose  administration to prevent further bleed. Most recently recommend stopping lactulose  given absence of asterixis and to avoid worsening electrolyte abnormalities and bowel urgency.   - Hepatology consulted, appreciate recommendations  - HOLD diuretics as above iso electrolyte abnormalities  -  Daily MELD labs  - Follow up with GI outpatient    MELD 3.0: 12 at 03/24/2024  7:49 AM  MELD-Na: 9 at 03/24/2024  7:49 AM  Calculated from:  Serum Creatinine: 0.49 mg/dL (Using min of 1 mg/dL) at 89/76/7974  2:50 AM  Serum Sodium: 153 mmol/L (Using max of 137 mmol/L) at 03/24/2024  7:49 AM  Total Bilirubin: 1.2 mg/dL at 89/78/7974  3:69 AM  Serum Albumin: 2.4 g/dL at 89/78/7974  3:69 AM  INR(ratio): 1.17 at 03/22/2024  6:30 AM  Age at listing (hypothetical): 78 years  Sex: Female at 03/24/2024  7:49 AM    Chronic Problems     # Vertebral Compression Fracture  T12 vertebral compression fracture with retropulsion into the ventral spinal canal on CT. Has known history of osteoporosis identified on Dexa scan of femoral neck (11/2022 T score -2.9).      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 Coli Bacteremia (Resolved)  Afebrile upon arrival to scheduled procedure via axillary temp, but found to be hypothermic to 34.1C via foley catheter. Otherwise, HDS without leukocytosis. Hypothermia improved with Bair Hugger. Eventually sedated and intubated as above for acute hypoxic respiratory failure and encephalopathy. No fluid pocket for diagnostic paracentesis on POCUS. CXR with interstitial edema and bilateral pulmonary effusions but no focal consolidation. UA noninfectious. Blood cultures 10/10 found to be positive for E. Coli. Required levophed briefly but remained stable after weaning off 10/12.  Completed treatment with Zosyn followed by Cefazolin  10/10-10/17.  Repeat blood cultures 10/12 were negative.  Unclear etiology though possible colonic source iso mass-like thickening involving the lower sigmoid colon (see below).    # Acute on Chronic Hypoxic and Hypercarbic Respiratory Failure (Resolved)  # Likely OSA  Presented with somnolence and difficulty to rouse s/p TEE/DCCV sedation as above. Transferred to CCU w/ pulm consult and placed on NIPPV. CXR with slight pulmonary edema s/p diuresis. Hypercarbia corrected on BiPAP then AVAPS from 86 > 58 but ultimately required intubation due to acute hypoxic respiratory failure from hypoventilation likely due to encephalopathy. Extubated to [place] on 10/13 and now stable on room air. Has been doing well on room air without need for additional oxygen supplementation overnight.         Mentation CAM: Overall CAM-ICU: Negative  6CIT:    Not yet completed (please touch base with nursing)  Delirium Order Set: Ordered (appropriate for any patient >65)  Dementia: No.  Behavioral Symptoms (baseline or now): No. Mobility Baseline functional status: Independent in ADLs  Current functional status: Needs Assistance with ADLs  PT/OT Ordered: Recommend AIR   Medications Reviewed home medications, screening for potentially inappropriate medications: Yes  Medications recommended de-prescribing: n/a   What Matters Geri Assessment complete: Yes see CGA note 03/22/2024         Issues Impacting Complexity of Management:  -Intensive monitoring of drug toxicity from Zofran  with EKG to monitor QTc and cefazolin  with BMP  -High risk of complications from pain and/or analgesia likely to result in delirium  -The patient is at high risk from Hospital immobility in an elderly patient given baseline poor functional status with a high risk of causing delirium and further decline in function  -The patient is at high risk for the development of complications of volume overload due to the need to provide IV hydration for  suspected hypovolemia in the setting of: heart failure and Cirrhosis  -Need for the following intensive monitoring parameter(s) due to high risk of clinical decline: frequent monitoring of urine output to assess for the efficacy of diuretic regimen  -The patient is at high risk of complications from delirium      Daily Checklist:  Diet: GI Softs  DVT PPx: Contraindicated - High Risk for Bleeding/Active Bleeding; however, patient is hypercoagulable  Electrolytes: Replete Potassium to >/=4 and Magnesium  to >/=2  Code Status: Full Code  Dispo: AIR    Team Contact Information:   Primary Team: Geriatrics ([facility])  Primary Resident: Lacinda Rimes, MD  Resident's Pager: [phone] (Geriatrics Intern - [place])    Interval History:   At 520 this morning, nursing concern for worsening confusion with patient endorsing frontal headache.  No neurological changes noted.  Stat head CT showed 2 new areas of hypoattenuation.  MRI brain was ordered.  A.m. labs were drawn.  Patient was stable by time of pre-rounding.     Patient says that she is okay this morning and feeling overall well.  Not currently having any headache or pain.  Says that she had a small bowel movement the day before but seems uncertain about this.  She was alert and oriented x 3.  Continue to work on eating and drinking.      Objective:   Temp:  [36.2 ??C (97.2 ??F)-36.3 ??C (97.3 ??F)] 36.2 ??C (97.2 ??F)  Pulse:  [64-72] 64  Resp:  [16-18] 17  BP: (134-173)/(72-113) 173/113  SpO2:  [93 %-95 %] 93 %    Gen: NAD, resting in bed, bandages on both sides of neck  Eyes: sclera anicteric  HENT: MMM  Heart: RRR, well-perfused, no murmurs, rubs, or gallops, no visible pulse at the ears  Lungs: Slightly increased WOB on room air; diminished sounds at the bases bilaterally  Abdomen: soft, NTND; non-shifting LUQ edema  GU: Dark golden yellow urine in collecting tube  Extremities: 1+ edema in the bilateral lower extremities foot to knee with slight edema at the hips  Psych: Alert and Oriented x3 (person, situation, location (hospital, but said [place]))    Labs/Studies: Labs and Studies from the last 24hrs per EMR and Reviewed    Lacinda Rimes, MD  PGY-1, [facility] Internal Medicine

## 2024-03-24 NOTE — Unmapped (Signed)
 Shift Summary  CT Head Wo Contrast revealed new focal areas of hypoattenuation in the right frontoparietal lobe.   CBC and metabolic panels showed several abnormal values, including low WBC, platelets, and high sodium.   Fall precautions and delirium precautions were maintained throughout the shift.   Patient required maximum assistance for hygiene and mobility, with some improvement to minimal/contact guard assist for transfers and ambulation by the end of the shift.   Overall, patient remained cooperative and agreeable to therapy, with no falls or injuries documented.     Skin Health and Integrity: Moisture-associated dermatitis noted in the panus/groin region with bruising, redness, and scaling; specialty bed, absorbent pads, and frequent weight shifts were utilized throughout the shift to support skin integrity. No new skin breakdown documented during the shift.     Absence of Hospital-Acquired Illness or Injury: Aseptic technique and infection prevention measures were consistently maintained, including cohorting, environmental surveillance, and PPE use. No documentation of new hospital-acquired illness or injury during the shift.     Optimal Comfort and Wellbeing: Pain remained at 0 throughout the shift and was denied during assessment; rest was effective for comfort.     Improved Ability to Complete Activities of Daily Living: Maximum assistance was required for hygiene and mobility, with minimal/contact guard assist needed for transfers and ambulation by the end of the shift. Independence was encouraged.     Absence of Fall and Fall-Related Injury: Bed alarm and fall reduction program were maintained, with hourly visual checks and side rails up for safety; no falls or injuries documented.

## 2024-03-25 LAB — CBC W/ AUTO DIFF
BASOPHILS ABSOLUTE COUNT: 0 10*9/L (ref 0.0–0.1)
BASOPHILS RELATIVE PERCENT: 0.4 %
EOSINOPHILS ABSOLUTE COUNT: 0.1 10*9/L (ref 0.0–0.5)
EOSINOPHILS RELATIVE PERCENT: 4.8 %
HEMATOCRIT: 36.8 % (ref 34.0–44.0)
HEMOGLOBIN: 12.3 g/dL (ref 11.3–14.9)
LYMPHOCYTES ABSOLUTE COUNT: 0.8 10*9/L — ABNORMAL LOW (ref 1.1–3.6)
LYMPHOCYTES RELATIVE PERCENT: 25.4 %
MEAN CORPUSCULAR HEMOGLOBIN CONC: 33.5 g/dL (ref 32.0–36.0)
MEAN CORPUSCULAR HEMOGLOBIN: 33.5 pg — ABNORMAL HIGH (ref 25.9–32.4)
MEAN CORPUSCULAR VOLUME: 100.2 fL — ABNORMAL HIGH (ref 77.6–95.7)
MEAN PLATELET VOLUME: 8.2 fL (ref 6.8–10.7)
MONOCYTES ABSOLUTE COUNT: 0.2 10*9/L — ABNORMAL LOW (ref 0.3–0.8)
MONOCYTES RELATIVE PERCENT: 7.1 %
NEUTROPHILS ABSOLUTE COUNT: 1.9 10*9/L (ref 1.8–7.8)
NEUTROPHILS RELATIVE PERCENT: 62.3 %
NUCLEATED RED BLOOD CELLS: 0 /100{WBCs} (ref <=4)
PLATELET COUNT: 96 10*9/L — ABNORMAL LOW (ref 150–450)
RED BLOOD CELL COUNT: 3.67 10*12/L — ABNORMAL LOW (ref 3.95–5.13)
RED CELL DISTRIBUTION WIDTH: 19.5 % — ABNORMAL HIGH (ref 12.2–15.2)
WBC ADJUSTED: 3 10*9/L — ABNORMAL LOW (ref 3.6–11.2)

## 2024-03-25 LAB — COMPREHENSIVE METABOLIC PANEL
ALBUMIN: 2.4 g/dL — ABNORMAL LOW (ref 3.4–5.0)
ALKALINE PHOSPHATASE: 99 U/L (ref 46–116)
ALT (SGPT): 24 U/L (ref 10–49)
ANION GAP: 13 mmol/L (ref 5–14)
BILIRUBIN TOTAL: 1.5 mg/dL — ABNORMAL HIGH (ref 0.3–1.2)
BLOOD UREA NITROGEN: 9 mg/dL (ref 9–23)
BUN / CREAT RATIO: 16
CALCIUM: 9.3 mg/dL (ref 8.7–10.4)
CHLORIDE: 106 mmol/L (ref 98–107)
CO2: 29 mmol/L (ref 20.0–31.0)
CREATININE: 0.57 mg/dL (ref 0.55–1.02)
EGFR CKD-EPI (2021) FEMALE: >90 mL/min/1.73m2 (ref >=60)
GLUCOSE RANDOM: 139 mg/dL (ref 70–179)
PROTEIN TOTAL: 5.4 g/dL — ABNORMAL LOW (ref 5.7–8.2)
SODIUM: 148 mmol/L — ABNORMAL HIGH (ref 135–145)

## 2024-03-25 LAB — BLOOD GAS, ARTERIAL
BASE EXCESS ARTERIAL: 11.1 — ABNORMAL HIGH (ref -2.0–2.0)
HCO3 ARTERIAL: 34 mmol/L — ABNORMAL HIGH (ref 22–27)
O2 SATURATION ARTERIAL: 99.6 % (ref 94.0–100.0)
PCO2 ARTERIAL: 45.1 mmHg — ABNORMAL HIGH (ref 35.0–45.0)
PH ARTERIAL: 7.49 — ABNORMAL HIGH (ref 7.35–7.45)
PO2 ARTERIAL: 147 mmHg — ABNORMAL HIGH (ref 80.0–110.0)

## 2024-03-25 LAB — BLOOD GAS CRITICAL CARE PANEL, VENOUS
BASE EXCESS VENOUS: 8.2 — ABNORMAL HIGH (ref -2.0–2.0)
BASE EXCESS VENOUS: 8.4 — ABNORMAL HIGH (ref -2.0–2.0)
CALCIUM IONIZED VENOUS (MG/DL): 5.1 mg/dL (ref 4.40–5.40)
CALCIUM IONIZED VENOUS (MG/DL): 4.98 mg/dL (ref 4.40–5.40)
GLUCOSE WHOLE BLOOD: 133 mg/dL (ref 54–400)
GLUCOSE WHOLE BLOOD: 147 mg/dL (ref 54–400)
HCO3 VENOUS: 29 mmol/L — ABNORMAL HIGH (ref 22–27)
HCO3 VENOUS: 31 mmol/L — ABNORMAL HIGH (ref 22–27)
HEMOGLOBIN BLOOD GAS: 12.3 g/dL (ref 12.00–16.00)
HEMOGLOBIN BLOOD GAS: 11.9 g/dL — ABNORMAL LOW (ref 12.00–16.00)
LACTATE BLOOD VENOUS: 1.8 mmol/L (ref 0.5–1.8)
LACTATE BLOOD VENOUS: 3.4 mmol/L — ABNORMAL HIGH (ref 0.5–1.8)
O2 SATURATION VENOUS: 47.2 % (ref 40.0–85.0)
O2 SATURATION VENOUS: 99.5 % — ABNORMAL HIGH (ref 40.0–85.0)
PCO2 VENOUS: 79 mmHg (ref 40–60)
PCO2 VENOUS: 56 mmHg (ref 40–60)
PH VENOUS: 7.26 — ABNORMAL LOW (ref 7.32–7.43)
PH VENOUS: 7.39 (ref 7.32–7.43)
PO2 VENOUS: 30 mmHg — ABNORMAL LOW (ref 35–40)
PO2 VENOUS: 170 mmHg — ABNORMAL HIGH (ref 35–40)
POTASSIUM WHOLE BLOOD: 4.2 mmol/L (ref 3.4–4.6)
POTASSIUM WHOLE BLOOD: 3.8 mmol/L (ref 3.4–4.6)
SODIUM WHOLE BLOOD: 149 mmol/L — ABNORMAL HIGH (ref 135–145)
SODIUM WHOLE BLOOD: 148 mmol/L — ABNORMAL HIGH (ref 135–145)

## 2024-03-25 LAB — BLOOD GAS CRITICAL CARE PANEL, ARTERIAL
BASE EXCESS ARTERIAL: 6.6 — ABNORMAL HIGH (ref -2.0–2.0)
CALCIUM IONIZED ARTERIAL (MG/DL): 4.38 mg/dL — ABNORMAL LOW (ref 4.40–5.40)
CARBOXYHEMOGLOBIN: 2.3 % — ABNORMAL HIGH (ref <1.2)
CHLORIDE, WHOLE BLOOD: 116 mmol/L — ABNORMAL HIGH (ref 98–107)
GLUCOSE WHOLE BLOOD: 137 mg/dL (ref 70–179)
HCO3 ARTERIAL: 29 mmol/L — ABNORMAL HIGH (ref 22–27)
HEMOGLOBIN BLOOD GAS: 11.2 g/dL — ABNORMAL LOW (ref 12.00–16.00)
LACTATE BLOOD ARTERIAL: 2.7 mmol/L — ABNORMAL HIGH (ref <1.3)
METHEMOGLOBIN: <1 % (ref <1.5)
O2 SATURATION ARTERIAL: >100 % — ABNORMAL HIGH (ref 94.0–100.0)
OXYHEMOGLOBIN: 97.2 % (ref 94.0–100.0)
PCO2 ARTERIAL: 26.9 mmHg — ABNORMAL LOW (ref 35.0–45.0)
PH ARTERIAL: 7.63 — ABNORMAL HIGH (ref 7.35–7.45)
PO2 ARTERIAL: 144 mmHg — ABNORMAL HIGH (ref 80.0–110.0)
POTASSIUM WHOLE BLOOD: 3.1 mmol/L — ABNORMAL LOW (ref 3.4–4.6)
SODIUM WHOLE BLOOD: 144 mmol/L (ref 135–145)

## 2024-03-25 LAB — URINALYSIS WITH MICROSCOPY WITH CULTURE REFLEX PERFORMABLE
BILIRUBIN UA: NEGATIVE
GLUCOSE UA: 500 — AB
HYALINE CASTS: 47 /LPF — ABNORMAL HIGH (ref 0–1)
KETONES UA: NEGATIVE
LEUKOCYTE ESTERASE UA: NEGATIVE
NITRITE UA: NEGATIVE
PH UA: 8 (ref 5.0–9.0)
PROTEIN UA: 70 — AB
RBC UA: 8 /HPF — ABNORMAL HIGH (ref <=4)
SPECIFIC GRAVITY UA: 1.011 (ref 1.003–1.030)
SQUAMOUS EPITHELIAL: 2 /HPF (ref 0–5)
UROBILINOGEN UA: <2
WBC UA: 25 /HPF — ABNORMAL HIGH (ref 0–5)

## 2024-03-25 LAB — BASIC METABOLIC PANEL
ANION GAP: 11 mmol/L (ref 5–14)
BLOOD UREA NITROGEN: 8 mg/dL — ABNORMAL LOW (ref 9–23)
BUN / CREAT RATIO: 17
CALCIUM: 9.3 mg/dL (ref 8.7–10.4)
CHLORIDE: 108 mmol/L — ABNORMAL HIGH (ref 98–107)
CO2: 34.5 mmol/L — ABNORMAL HIGH (ref 20.0–31.0)
CREATININE: 0.48 mg/dL — ABNORMAL LOW (ref 0.55–1.02)
EGFR CKD-EPI (2021) FEMALE: >90 mL/min/1.73m2 (ref >=60)
GLUCOSE RANDOM: 107 mg/dL (ref 70–179)
POTASSIUM: 3.5 mmol/L (ref 3.4–4.8)
SODIUM: 153 mmol/L — ABNORMAL HIGH (ref 135–145)

## 2024-03-25 LAB — CBC
HEMATOCRIT: 36.5 % (ref 34.0–44.0)
HEMOGLOBIN: 12.3 g/dL (ref 11.3–14.9)
MEAN CORPUSCULAR HEMOGLOBIN CONC: 33.6 g/dL (ref 32.0–36.0)
MEAN CORPUSCULAR HEMOGLOBIN: 33.1 pg — ABNORMAL HIGH (ref 25.9–32.4)
MEAN CORPUSCULAR VOLUME: 98.7 fL — ABNORMAL HIGH (ref 77.6–95.7)
MEAN PLATELET VOLUME: 8.6 fL (ref 6.8–10.7)
PLATELET COUNT: 134 10*9/L — ABNORMAL LOW (ref 150–450)
RED BLOOD CELL COUNT: 3.7 10*12/L — ABNORMAL LOW (ref 3.95–5.13)
RED CELL DISTRIBUTION WIDTH: 18.5 % — ABNORMAL HIGH (ref 12.2–15.2)
WBC ADJUSTED: 9.7 10*9/L (ref 3.6–11.2)

## 2024-03-25 LAB — PROTIME-INR
INR: 1.19
PROTIME: 13.6 s — ABNORMAL HIGH (ref 9.9–12.6)

## 2024-03-25 LAB — APTT
APTT: 41 s — ABNORMAL HIGH (ref 24.8–38.4)
HEPARIN CORRELATION: 0.2

## 2024-03-25 LAB — PRO-BNP: PRO-BNP: 467 pg/mL — ABNORMAL HIGH (ref <=300.0)

## 2024-03-25 LAB — PHOSPHORUS: PHOSPHORUS: 2.3 mg/dL — ABNORMAL LOW (ref 2.4–5.1)

## 2024-03-25 LAB — AMMONIA: AMMONIA: 160 umol/L — ABNORMAL HIGH (ref 11–32)

## 2024-03-25 LAB — MAGNESIUM: MAGNESIUM: 2.4 mg/dL (ref 1.6–2.6)

## 2024-03-25 LAB — OSMOLALITY, SERUM: OSMOLALITY MEASURED: 312 mosm/kg — ABNORMAL HIGH (ref 275–295)

## 2024-03-25 LAB — FIBRINOGEN: FIBRINOGEN LEVEL: 134 mg/dL — ABNORMAL LOW (ref 175–500)

## 2024-03-25 MED ADMIN — enoxaparin (LOVENOX) syringe 70 mg: 70 mg | SUBCUTANEOUS | @ 03:00:00 | Stop: 2024-03-28

## 2024-03-25 MED ADMIN — polyethylene glycol (MIRALAX) packet 17 g: 17 g | ORAL | @ 03:00:00

## 2024-03-25 MED ADMIN — magnesium oxide (MAG-OX) tablet 400 mg: 400 mg | ORAL | @ 03:00:00

## 2024-03-25 MED ADMIN — carvedilol (COREG) tablet 3.125 mg: 3.125 mg | ORAL | @ 03:00:00

## 2024-03-25 MED ADMIN — sodium chloride (NS) 0.9 % flush 3 mL: 3 mL | INTRAVENOUS | @ 01:00:00

## 2024-03-25 MED ADMIN — senna (SENOKOT) tablet 2 tablet: 2 | ORAL | @ 03:00:00

## 2024-03-25 MED ADMIN — propofol (DIPRIVAN) infusion 10 mg/mL: 0-50 ug/kg/min | INTRAVENOUS | @ 17:00:00

## 2024-03-25 MED ADMIN — propofol (DIPRIVAN) infusion 10 mg/mL: 0-50 ug/kg/min | INTRAVENOUS | @ 22:00:00

## 2024-03-25 MED ADMIN — pantoprazole (Protonix) EC tablet 40 mg: 40 mg | ORAL | @ 12:00:00

## 2024-03-25 MED ADMIN — dextrose 5 % infusion: 75 mL/h | INTRAVENOUS | @ 22:00:00 | Stop: 2024-03-26

## 2024-03-25 MED ADMIN — dextrose 5 % infusion: 75 mL/h | INTRAVENOUS | @ 16:00:00 | Stop: 2024-03-26

## 2024-03-25 MED ADMIN — NORepinephrine 8 mg in dextrose 5 % 250 mL (32 mcg/mL) infusion PMB: 0-30 ug/min | INTRAVENOUS | @ 17:00:00

## 2024-03-25 MED ADMIN — folic acid (FOLVITE) tablet 1 mg: 1 mg | ORAL | @ 12:00:00

## 2024-03-25 MED ADMIN — Propofol (DIPRIVAN) 10 mg/mL injection: INTRAVENOUS | @ 17:00:00 | Stop: 2024-03-25

## 2024-03-25 MED ADMIN — empagliflozin (JARDIANCE) tablet 10 mg: 10 mg | ORAL | @ 12:00:00

## 2024-03-25 MED ADMIN — anastrozole (ARIMIDEX) tablet 1 mg: 1 mg | ORAL | @ 13:00:00

## 2024-03-25 MED ADMIN — succinylcholine (ANECTINE) injection 88.2 mg: 1 mg/kg | INTRAVENOUS | @ 17:00:00 | Stop: 2024-03-25

## 2024-03-25 MED ADMIN — cyanocobalamin (vitamin B-12) tablet 1,000 mcg: 1000 ug | ORAL | @ 12:00:00

## 2024-03-25 MED ADMIN — enoxaparin (LOVENOX) syringe 70 mg: 70 mg | SUBCUTANEOUS | @ 12:00:00 | Stop: 2024-03-25

## 2024-03-25 MED ADMIN — valsartan (DIOVAN) tablet 40 mg: 40 mg | ORAL | @ 12:00:00 | Stop: 2024-03-25

## 2024-03-25 MED ADMIN — sodium chloride (NS) 0.9 % flush 3 mL: 3 mL | INTRAVENOUS | @ 16:00:00

## 2024-03-25 MED ADMIN — sodium chloride (NS) 0.9 % flush 3 mL: 3 mL | INTRAVENOUS | @ 08:00:00

## 2024-03-25 MED ADMIN — vancomycin (VANCOCIN) 1500 mg in dextrose 5 % 300 mL IVPB (premix): 1500 mg | INTRAVENOUS | @ 19:00:00 | Stop: 2024-03-25

## 2024-03-25 MED ADMIN — NORepinephrine bitartrate-D5W 8 mg/250 mL (32 mcg/mL) infusion: INTRAVENOUS | @ 17:00:00 | Stop: 2024-03-25

## 2024-03-25 MED ADMIN — phenylephrine 1 mg/10 mL (100 mcg/mL) injection Syrg: @ 17:00:00 | Stop: 2024-03-25

## 2024-03-25 MED ADMIN — magnesium oxide (MAG-OX) tablet 400 mg: 400 mg | ORAL | @ 12:00:00

## 2024-03-25 MED ADMIN — thiamine mononitrate (vit B1) tablet 100 mg: 100 mg | ORAL | @ 13:00:00

## 2024-03-25 MED ADMIN — sodium bicarbonate injection: INTRAVENOUS | @ 17:00:00 | Stop: 2024-03-25

## 2024-03-25 MED ADMIN — cefepime (MAXIPIME) 2 g in sodium chloride 0.9 % (NS) 100 mL IVPB-MBP: 2 g | INTRAVENOUS | @ 18:00:00 | Stop: 2024-03-25

## 2024-03-25 MED ADMIN — spironolactone (ALDACTONE) tablet 25 mg: 25 mg | ORAL | @ 12:00:00 | Stop: 2024-03-25

## 2024-03-25 MED ADMIN — EPINEPHrine (ADRENALIN) 0.1 mg/mL injection: INTRAVENOUS | @ 17:00:00 | Stop: 2024-03-25

## 2024-03-25 MED ADMIN — multivitamins, therapeutic with minerals tablet 1 tablet: 1 | ORAL | @ 12:00:00

## 2024-03-25 MED ADMIN — senna (SENOKOT) tablet 2 tablet: 2 | ORAL | @ 12:00:00

## 2024-03-25 MED ADMIN — bisacodyl (DULCOLAX) suppository 10 mg: 10 mg | RECTAL | @ 16:00:00 | Stop: 2024-03-25

## 2024-03-25 MED ADMIN — polyethylene glycol (MIRALAX) packet 17 g: 17 g | ORAL | @ 13:00:00

## 2024-03-25 MED ADMIN — carvedilol (COREG) tablet 3.125 mg: 3.125 mg | ORAL | @ 13:00:00

## 2024-03-25 NOTE — Progress Notes (Signed)
 MICU Daily Progress Note     Date of Service: 03/25/2024    Problem List:   Principal Problem:    Bacteremia, escherichia coli  Active Problems:    Alcoholic liver disease (HHS-HCC)    HTN (hypertension)    Depression with anxiety    Class 2 severe obesity with serious comorbidity in adult    Atrial fibrillation    (CMS-HCC)    Pleural effusion    Hypernatremia    Thrombocytopenia    Cirrhosis    (CMS-HCC)    A-fib (CMS-HCC)    Hepatic encephalopathy    (CMS-HCC)      Interval history: [name] is a 78 y.o. female with HFpEF, HTN, Afib on AC, MASLD cirrhosis, originally hospitalized for scheduled TEE/DCCV (aborted d/t LAA clot) c/b AHHRF, encephalopathy and septic shock requiring MICU care, now s/p extubation transferred to [facility] for further management of anticoagulation and hypernatremia. Transferred to [facility] MICU for closer monitoring for respiratory status, pressor requirement, and management of carotid artery injury.     On 10/24, patient had acute worsening of respiratory status leading requiring intubation. When patient was given sedation for intubation, she coded and promptly had ROSC after a few minutes of CPR. Subsequently, CVC placed in neck, used to draw blood, unclear if given pressors through it, then found  to be in arterial system based on blood gas and imaging, likely right carotid. Line was subsequently removed, and pressure was held for 15 minutes, hemostasis with no hematoma. She was transferred to [facility] MICU for management of this carotid injury, to be evaluated by vascular surgery while also managing her increased vasopressor requirement and respiratory distress (2 pressure shock and intubated on arrival).    Neurological   Analgosedation  Altered mental status with history of hepatic encephalopathy  Per chart review at [facility], rapid response called on 10/24 for increased somnolence. At the time, differential included hepatic encephalopathy, toxic metabolic encephalopathy in the setting of shock (possibly sepsis), versus intracranial pathology. She was subsequently intubated. CT head on 10/24 showed no acute intracranial abnormality. On 30mcg/min of propofol  on arrival.   - Continue Propofol , wean as tolerated to assess neurological status  - Goal RASS 0 to -1  - Continue lactulose     Analgesia: No pain issues  RASS at goal? Yes  [place] Agitation Assessment Scale (RASS) : -2 (03/25/2024  6:00 PM)    Pulmonary   Acute Hypoxic Hypercarbic Respiratory Failure c/f septic shock   Rapid response called on 10/24 for increased somnolence, found to have acute hypercarbic respiratory failure and subsequently intubated. Suspect hypercarbic respiratory failure due to encephalopathy as above. CXR on 10/24 showed worsening pleural effusions. Not currently getting diuresed especially in the setting of 2 pressor shock. Given patient's increased pressor requirement, hypoxia, and AMS, concern for infectious etiology.   - Continue mechanical ventilation   - LRCx, Bcx pending  - MRSA nares  - repeat ABG, lactate, CBC  - continue cefepime and vanc    S RR:  [24] 24  FiO2 (%):  [40 %-70 %] 40 %  S VT:  [400 mL] 400 mL  O2 Device: Ventilator  O2 Flow Rate (L/min):  [1 L/min-1.5 L/min] 1 L/min    Cardiovascular   Carotid Artery Injury   At [facility], CVC placed intended for RIJ on 10/24 following intubation, PEA, ROSC. Was used with known blood return. Unclear if it was used for pressor support, but highly likely that it was. CXR for placement check  showed concern for intra aterial location, and blood gas confirmed it was arterial. Thought to be in carotid artery. CVC removed with pressure held for 15 minutes, with no hematoma formation. On arrival, no physical exam and bedside ultrasound showed no large hematoma. No active bleeding.   - vascular surgery consulted   - consider CTA neck if patient becomes more hypotensive  - Repeat cbc as above     PEA arrest s/p ROSC; shock; atrial fibrillation, known left atrial appendage clot, HFpEF  Patient with PEA arrest peri-intubation with ROSC. Shock favored to be distributive in nature, possibly sepsis. History of atrial fibrillation with known LAA clot and HFpEF.   - Hold Coreg , eliquis, GDMT given hemodynamic instability, consider restarting once more clinically stable  - Currently on vaso, NE as stated above    Renal   Hypernatremia   At [facility], had persistently hypernatremia near 153. Had been treated with D5 gtt.   - Strict I/O's  - Sodium check q6hours     Infectious Disease/Autoimmune   Rapid response called 10/24, patient noted to be hypothermic. WBCs jumped from 3.0 to 9.7 on 10/24. Although the most recent lab was following intubation and CPR. UA with pyuria, rare bacteria, hematuria. Peri-intubation, patient developed significant hypotension requiring initiation of NE and vasopressin. Suspect possible septic shock with unknown infectious source. Patient started on vancomycin and cefepime 10/24. CT Abdomen pelvis concerning for aspiration pneumonia. Of note, earlier in hospitalization had GNR bacteremia thought to have urinary source  - continue vancomycin and cefepime as above  - follow blood, urine, lower resp culture  - mrsa nares     Cultures:  Blood Culture, Routine (no units)   Date Value   03/11/2024 Escherichia coli (A)   03/11/2024 Escherichia coli (A)     WBC (10*9/L)   Date Value   03/25/2024 3.0 (L)     WBC, UA (/HPF)   Date Value   03/25/2024 25 (H)       FEN/GI   Sigmoid Mass c/f Malignancy and Hematochezia  Per chart review, On 10/10, patient noted have brown stool followed by maroon stool with bright pink blood tinging the pad below. No hematemesis. Received variceal bleed ppx (PPI, ceftriaxone, and octreotide).  At the time, low suspicion for variceal bleeding given HD stability and stable hemoglobin. Suspect related to colonic mass. Had recurrent hematochezia on 10/13 in the s/o challenge of subQ heparin, however has resolved since this was stopped. Per luminal GI 10/16, do not need CT GI bleed if not actively bleeding. Plan to consult GI officially if bleeding begins. On 10/24, patient had bloody fluid suctioned from NGT. C/f varices so reordered octreotide,  CBC to follow H&H. 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 Given shock of unclear etiology. CTAP demonstrating cirrhosis. Likely that cirrhosis is contributing to hypotension.   - Continue lactulose   - NGT in place  - Daily MELD labs and CMP  - clotting factors     MELD 3.0: 12 at 03/24/2024  7:49 AM  MELD-Na: 9 at 03/24/2024  7:49 AM  Calculated from:  Serum Creatinine: 0.49  mg/dL (Using min of 1 mg/dL) at 89/76/7974  2:50 AM  Serum Sodium: 153 mmol/L (Using max of 137 mmol/L) at 03/24/2024  7:49 AM  Total Bilirubin: 1.2 mg/dL at 89/78/7974  3:69 AM  Serum Albumin: 2.4 g/dL at 89/78/7974  3:69 AM  INR(ratio): 1.17 at 03/22/2024  6:30 AM  Age at listing (hypothetical): 67 years  Sex: Female at 03/24/2024  7:49 AM    Provider Malnutrition Assessment:  Body mass index is 33.36 kg/m??.BMI Interpretation: >/= 30 and < 40, consistent with obesity, clinically significant requiring additional resources and complicating multiple aspects of patient care.  GLIM criteria:   Pt does not meet criteria  -I have screened this patient for malnutrition and they did NOT meet criteria for malnutrition based on GLIM criteria.    Heme/Coag   NAI    Endocrine   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)   - Continue home anastrozole     Integumentary   NAI   #  - WOCN consulted for high risk skin assessment No. Reason: Not indicated.    Prophylaxis/LDA/Restraints/Consults   ICU Checklist completed: no (see ICU rounding navigator in Epic)    Patient Lines/Drains/Airways Status       Active Active Lines, Drains, & Airways       Name Placement date Placement time Site Days    ETT  7.5 03/25/24  1317  -- less than 1    NG/OG Tube Center mouth 03/25/24  1800  Center mouth  less than 1    Urethral Catheter Temperature probe;Non-latex 03/25/24  1354  Temperature probe;Non-latex  less than 1    Peripheral IV 03/15/24 Anterior;Left;Proximal Forearm 03/15/24  1538  Forearm  10    Peripheral IV 03/20/24 Anterior;Left;Upper Arm 03/20/24  1730  Arm  5    Peripheral IV 03/25/24 Left;Posterior Hand 03/25/24  1950  Hand  less than 1    Arterial Line 03/25/24 Left Radial 03/25/24  1700  Radial  less than 1                  Patient Lines/Drains/Airways Status       Active Wounds       Name Placement date Placement time Site Days    Wound 03/01/24 Other (Comment) Toe (Comment which one) Left;Other (Comment) Blister present on arrival, tx already in outpatient setting, in process of healing, surrounding erythema 03/01/24  0129  Toe (Comment which one)  24    Wound 03/13/24 Irritant Contact Dermatitis Incontinence Sacrum Mid gluteal cleft MASD? 03/13/24  --  Sacrum  12                  Goals of Care     Code Status:   Orders Placed This Encounter   Procedures    Full Code     Standing Status:   Standing     Number of Occurrences:   1        Designated Healthcare Decision Maker:  Ms. Yott designated healthcare decision maker(s) is/are   HCDM (patient stated preference): Berryhill,[name] - Spouse - [phone]    HCDM, First AlternateAllexa, Acoff - Daughter - [phone]. See HCDM section of Epic sidebar/storyboard or ACP tab in patient chart for details regarding active HCDMs and patient capacity for decision-making.      Subjective     Ms. Brodrick transported from [place], arrived intubated on 14 of NE, vasopressin. She is intubated and heavily sedation. Spontaneously  wiggles or toes and grimaces in response to pain.    Objective     Vitals - past 24 hours  Temp:  [34.4 ??C (93.9 ??F)-36.3 ??C (97.3 ??F)] 34.4 ??C (93.9 ??F)  Pulse:  [51-142] 65  SpO2 Pulse:  [61-142] 66  Resp:  [8-113] 24  BP: (132-203)/(52-190) 150/81  A BP-1: (79-137)/(46-76) 128/66  FiO2 (%):  [40 %-70 %] 40 %  SpO2:  [91 %-100 %] 100 % Intake/Output  I/O last 3 completed shifts:  In: 858.3 [P.O.:220; I.V.:638.3]  Out: 130 [Urine:130]     Physical Exam:    General: intubated and sedated  HEENT: normocephalic, atraumatic   CV: pulses palpable, RRR   Pulm: intubated and sedated, equal expansion  GI: soft, distended with fluid shift   MSK: No lower extremity edema, no clubbing or cyanosis  Skin: numerous large flat violaceous hematomas on left arm, chest, neck bilaterally.   Neuro: heavily sedated, spontaneously moves with toes, grimaces to pain    Continuous Infusions:   Infusions Meds[1]    Scheduled Medications:   Scheduled Medications[2]    PRN medications:  PRN Medications[3]    Data/Imaging Review: Reviewed in Epic and personally interpreted on 03/25/2024. See EMR for detailed results.    Dale Ozell Ladora Arnell, MD   [facility] Internal Medicine PGY-1         [1]    dextrose  75 mL/hr (03/25/24 1822)    NORepinephrine bitartrate-NS 14 mcg/min (03/25/24 2056)    octreotide infusion      propofol  10 mg/mL infusion 30 mcg/kg/min (03/25/24 2055)    vasopressin 0.03 Units/min (03/25/24 2014)   [2]    anastrozole   1 mg Oral Daily    [Provider Hold] carvedilol   3.125 mg Oral BID    cefepime  2 g Intravenous Q8H    cyanocobalamin (vitamin B-12)  1,000 mcg Oral Daily    [Provider Hold] empagliflozin  10 mg Oral Daily    famotidine  20 mg Enteral tube: gastric BID    flu vac 2025 [id number](PF)  0.5 mL Intramuscular During hospitalization    folic acid   1 mg Oral Daily    lactulose   20 g Enteral tube: gastric 4x Daily    magnesium  oxide  400 mg Oral BID    melatonin  3 mg Oral QPM multivitamins (ADULT)  1 tablet Oral Daily    octreotide  50 mcg Intravenous Once    pantoprazole  40 mg Oral Daily    polyethylene glycol  17 g Oral BID    senna  2 tablet Oral BID    sodium chloride   3 mL Intravenous Q8H    thiamine  mononitrate (vit B1)  100 mg Oral Daily    [START ON 03/26/2024] vancomycin  750 mg Intravenous Q12H   [3] acetaminophen , docusate sodium, fentaNYL  (PF) **OR** fentaNYL  (PF)

## 2024-03-25 NOTE — Plan of Care (Signed)
 Shift Summary  Oxygen therapy was discontinued and transitioned to room air while maintaining adequate oxygen saturation.   Pain remained well controlled with no reported discomfort, and constipation prevention medications were administered.   Skin integrity was supported with frequent repositioning and use of protective devices throughout the shift.   Fall prevention strategies were consistently implemented, including bed alarms, side rails, and scheduled toileting.   Overall, interventions for comfort, safety, and skin protection were maintained and no adverse events were documented during the shift.    Skin Health and Integrity: Skin protection and pressure reduction interventions were consistently maintained throughout the shift, including use of incontinence pads, positioning supports, and pressure-redistributing devices; frequent repositioning and device skin pressure protection were documented, with no changes in skin status noted.    Absence of Hospital-Acquired Illness or Injury: Safety interventions such as bed alarms, fall reduction programs, and side rails were maintained, and hourly visual checks were performed; no new hospital-acquired injuries were documented during the shift.    Optimal Comfort and Wellbeing: Pain assessments consistently indicated no pain, and comfort measures were maintained; constipation prevention medications were administered early in the shift.    Effective Breathing Pattern: Oxygen therapy was transitioned from nasal cannula to room air with SpO2 remaining above 95%, and respiratory rate remained stable throughout the shift.    Absence of Fall and Fall-Related Injury: Fall risk interventions including bed alarms, side rails, scheduled toileting, and hourly visual checks were maintained, with no falls or related injuries documented.

## 2024-03-25 NOTE — Consults (Signed)
 CVAD Liaison - Procedure In Progress Upon Arrival Note      The CVAD Liaison was contacted for the insertion of Central Venous Access Device (CVAD).  The procedure had already started prior to arrival. CVAD Liaison remained as a resource. CVAD was inserted by H. Sharion, MD. Report of the procedure given to the Primary Nurse.    Thank you for this consult,  Jon JONETTA Cavalier, RN, CVAD Liaison     Consult Time 45 minutes (min)

## 2024-03-25 NOTE — Consults (Signed)
 Nephrology Consult Note    Requesting Attending Physician :  Odis Juliene Cheney, MD  Service Requesting Consult : Geriatrics (MDA)  Reason for Consult: Hypernatremia    Assessment and Plan: 78 y.o. 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 Patient appears dry on exam and labs concerning for contraction alkalosis.   - recommend holding Jardiance for now can revisit when patient is more awake and able to take PO on her own  - Free water  deficit of 3.3L   - would replace free water  deficit 65ml/hr D5W for 24hrs  - would check sodium Every 12 hours      # AMS  - would check Ammonia level  - will treat hyponatremia to hopefully have improvement in mention   - Evaluation and management per primary team  - No changes to management from a nephrology standpoint at this time    RECOMMENDATIONS:   - would replace free water  deficit 45ml/hr D5W for 24hrs  - would check sodium Every 12 hours  - hold jardiance  - We will continue to follow.     [name], MD  03/25/2024 12:18 PM     Medical decision-making for 03/25/24  Findings / Data     Patient has: []  acute illness w/systemic sxs  [mod]  []  two or more stable chronic illnesses [mod]  []  one chronic illness with acute exacerbation [mod]  []  acute complicated illness  [mod]  []  Undiagnosed new problem with uncertain prognosis  [mod] [x]  illness posing risk to life or bodily function (ex. AKI)  [high]  []  chronic illness with severe exacerbation/progression  [high]  []  chronic illness with severe side effects of treatment  [high] Hypernatremia, AMS, Afib Probs At least 2:  Probs, Data, Risk   I reviewed: [x]  primary team note  [x]  consultant note(s)  []  external records [x]  chemistry results  [x]  CBC results  []  blood gas results  []  Other []  procedure/op note(s)   [x]  radiology report(s)  []  micro result(s)  []  w/ independent historian(s) Hypernatremia, alkalosis, low platelets >=3 Data Review (2 of 3)    I independently interpreted: []  Urine Sediment  []  Renal US  []  CXR Images  []  CT Images  []  Other []  EKG Tracing  Any     I discussed: []  Pathology results w/ QHPs(s) from other specialties  []  Procedural findings w/ QHPs(s) from other specialties []  Imaging w/ QHP(s) from other specialties  [x]  Treatment plan w/ QHP(s) from other specialties Plan discussed with primary team Any     Mgm't requires: []  Prescription drug(s)  [mod]  []  Kidney biopsy  [mod]  []  Central line placement  [mod] [x]  High risk medication use and/or intensive toxicity monitoring [high]  []  Renal replacement therapy [high]  []  High risk kidney biopsy  [high]  []  Escalation of care  [high]  []  High risk central line placement  [high] Infusion of D5W monitor electrolytes Risk      ____________________________________________________    History of Present Illness: [name] is 78 y.o. 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 Nephrology has been consulted for Hypernatremia.     Unable to receive much history as patient is somnolent per EMR patient initially presenting for DCCV but found to have an LV thrombus and thus aborted. Transferred to [facility] due to AMS. Per nursing staff patient was alert yesterday but not really eating or drinking well and this morning really has not been able to wake up. Patient labs are concerning for dehydration and volume depletion.     INPATIENT MEDICATIONS:  Current Medications[1]    OUTPATIENT MEDICATIONS:  Prior to Admission medications   Medication Dose, Route, Frequency   anastrozole  (ARIMIDEX ) 1 mg tablet 1 mg, Oral, Daily (standard)   carvedilol  (COREG ) 3.125 MG tablet 3.125 mg, Oral, 2 times a day (standard)   folic acid  (FOLVITE ) 1 MG tablet 1 mg, Daily (standard)   furosemide (LASIX) 40 MG tablet 40 mg, Oral, Daily PRN   magnesium  oxide (MAG-OX) 400 mg (241.3 mg elemental magnesium ) tablet 400 mg, Oral, 2 times a day (standard)   potassium chloride  20 MEQ ER tablet 20 mEq, Oral, 2 times a day (standard)   spironolactone (ALDACTONE) 25 MG tablet 25 mg, Oral, Daily (standard)   thiamine  (B-1) 100 MG tablet 100 mg, Daily (standard)   vitamin E-268 mg, 400 UNIT, 268 mg (400 UNIT) capsule 800 mg, Oral, Daily (standard), 2 tabs   acetaminophen  (TYLENOL ) 325 MG tablet 650 mg, Oral, Every 6 hours PRN   [Paused] alendronate  (FOSAMAX ) 70 MG tablet 70 mg, Oral, Every 7 days  Patient not taking: Reported on 03/07/2024  Paused since Fri 03/04/2024 until manually resumed   calcium carbonate (OS-CAL) 1,250 mg (500 mg elem calcium) tablet 1 tablet, Daily (standard)  Patient not taking: Reported on 03/07/2024   cyanocobalamin 1000 MCG tablet 1,000 mcg, Daily (standard)   empagliflozin (JARDIANCE) 10 mg tablet 10 mg, Oral, Daily (standard), For benefit inquiry only. Please send Epic chat message to Ashley Blue with coverage information, then discontinue Rx.        ALLERGIES:  Sulfa (sulfonamide antibiotics), Sulfur , and Aspirin    MEDICAL HISTORY:  Past Medical History[2]  Past Surgical History[3]  SOCIAL HISTORY  Social History     Social History Narrative    Daughter fills med boxes weekly.       reports that she has never smoked. She has been exposed to tobacco smoke. She has never used smokeless tobacco. She reports that she does not currently use alcohol. She reports that she does not use drugs.   FAMILY HISTORY  Family History[4]     Physical Exam:   Vitals:    03/24/24 2255 03/25/24 0341 03/25/24 0712 03/25/24 1000   BP: 168/79 158/75 153/68    Pulse: 64 51 59    Resp: 20 20 16     Temp:   36.3 ??C (97.3 ??F) TempSrc:   Oral    SpO2: 99% 95% 91% 98%   Weight:   88.2 kg (194 lb 7.1 oz)    Height:         I/O this shift:  In: 120 [P.O.:120]  Out: -     Intake/Output Summary (Last 24 hours) at 03/25/2024 1218  Last data filed at 03/25/2024 0715  Gross per 24 hour   Intake 220 ml   Output 90 ml   Net 130 ml     Constitutional: ill appearing no acute distress  Heart: RRR, no m/r/g  Lungs: CTAB, normal  wob  Abd: soft, non-tender, non-distended  Ext: no edema          [1]   Current Facility-Administered Medications:     acetaminophen  (TYLENOL ) tablet 650 mg, Oral, Q4H PRN    anastrozole  (ARIMIDEX ) tablet 1 mg, Oral, Daily    apixaban (ELIQUIS) tablet 5 mg, Oral, BID    carvedilol  (COREG ) tablet 3.125 mg, Oral, BID    cyanocobalamin (vitamin B-12) tablet 1,000 mcg, Oral, Daily    dextrose  5 % infusion, Intravenous, Continuous    docusate sodium (COLACE) capsule 100 mg, Oral, BID PRN    [Provider Hold] empagliflozin (JARDIANCE) tablet 10 mg, Oral, Daily    flu vacc 2025-26 (65 yr up) (PF)(FLUAD)45 mcg(52mcgx3)/0.5 ml IM syringe, Intramuscular, During hospitalization    folic acid  (FOLVITE ) tablet 1 mg, Oral, Daily    magnesium  oxide (MAG-OX) tablet 400 mg, Oral, BID    multivitamins, therapeutic with minerals tablet 1 tablet, Oral, Daily    ondansetron  (ZOFRAN -ODT) disintegrating tablet 4 mg, Oral, Q8H PRN    pantoprazole (Protonix) EC tablet 40 mg, Oral, Daily    polyethylene glycol (MIRALAX) packet 17 g, Oral, BID    senna (SENOKOT) tablet 2 tablet, Oral, BID    sodium chloride  (NS) 0.9 % flush 3 mL, Intravenous, Q8H    spironolactone (ALDACTONE) tablet 25 mg, Oral, Daily    thiamine  mononitrate (vit B1) tablet 100 mg, Oral, Daily    valsartan (DIOVAN) tablet 40 mg, Oral, Daily  [2]   Past Medical History:  Diagnosis Date    A-fib (CMS-HCC)     Alcoholism    (CMS-HCC)     Alcoholism /alcohol abuse     Cirrhosis    (CMS-HCC)     Depression     Hypertension     Liver disease     Malignant neoplasm of overlapping sites of right breast in female, estrogen receptor positive    (CMS-HCC) 10/03/2020   [3]   Past Surgical History:  Procedure Laterality Date    BREAST BIOPSY Right     benign-a long time ago    BREAST BIOPSY Right 07/2020    malignant    BREAST LUMPECTOMY Right     4 2022    CHOLECYSTECTOMY      PR BX/REMV,LYMPH NODE,DEEP AXILL Right 09/03/2020    Procedure: BX/EXC LYMPH NODE; OPEN, DEEP AXILRY NODE;  Surgeon: Alm Elsie Como, MD;  [facility];  Service: Surgical Oncology Breast    PR CARDIOVERSION, ELECTIVE;EXTERN N/A 03/10/2024    Procedure: CARDIOVERSION, ELECTIVE, ELECTRICAL CONVERSION OF ARRHYTHMIA; EXTERNAL;  Surgeon: Sedalia Velma Hamilton, MD;  Location: [facility] OR [facility];  Service: Cardiology    PR ECHO HEART,TRANSESOPHAGEAL,COMPLETE Midline 03/10/2024    Procedure: ECHOCARDIOGRAPHY, TRANSESOPHAGEAL, REAL-TIME WITH IMAGE DOCUMENTATION;  Surgeon: Sedalia Velma Hamilton, MD;  Location: [facility] OR [facility];  Service: Cardiology    PR INTRAOPERATIVE SENTINEL LYMPH NODE ID W DYE INJECTION Right 09/03/2020    Procedure: INTRAOPERATIVE IDENTIFICATION SENTINEL LYMPH NODE(S) INCLUDE INJECTION NON-RADIOACTIVE DYE, WHEN PERFORMED;  Surgeon: Alm Elsie Como, MD;  Location: [facility] OR [facility];  Service: Surgical Oncology Breast    PR MASTECTOMY, PARTIAL Right 09/03/2020    Procedure: MASTECTOMY, PARTIAL (EG, LUMPECTOMY, TYLECTOMY, QUADRANTECTOMY, SEGMENTECTOMY);  Surgeon: Alm Elsie Como, MD;  Location: [facility] OR [facility];  Service: Surgical Oncology Breast    PR UPPER GI ENDOSCOPY,BIOPSY N/A 02/03/2018    Procedure: UGI ENDOSCOPY; WITH BIOPSY, SINGLE OR MULTIPLE;  Surgeon: Eleanor Dewey Sorrel, MD;  Location: [facility] PROCEDURES [place];  Service: Gastroenterology    RADIATION Right     unsure finish date    TUBAL LIGATION     [4]   Family History  Problem Relation Age of Onset    Heart disease Mother     No Known Problems Father     No Known Problems Sister     No Known Problems Daughter     No Known Problems Maternal Grandmother No Known Problems Maternal Grandfather     No Known Problems Paternal Grandmother     No Known Problems Paternal Grandfather     Lupus Brother     No Known Problems Other     BRCA 1/2 Neg Hx     Breast cancer Neg Hx     Cancer Neg Hx     Colon cancer Neg Hx     Endometrial cancer Neg Hx     Ovarian cancer Neg Hx     Mental illness Neg Hx     Substance Abuse Disorder Neg Hx

## 2024-03-25 NOTE — Consults (Signed)
 Pulmonary ICU Initial Consult Note     Date of Service: 03/25/2024  Requesting Physician: Odis Juliene Cheney, MD   Requesting Service: Geriatrics (MDA)  Reason for consultation: Comprehensive evaluation of mechanical ventilation and respiratory failure.    Hospital Problems:  Principal Problem:    Bacteremia, escherichia coli  Active Problems:    Alcoholic liver disease (HHS-HCC)    HTN (hypertension)    Depression with anxiety    Class 2 severe obesity with serious comorbidity in adult    Atrial fibrillation    (CMS-HCC)    Pleural effusion    Hypernatremia    Thrombocytopenia    Cirrhosis    (CMS-HCC)    A-fib (CMS-HCC)    Hepatic encephalopathy    (CMS-HCC)      HPI: [name] is a 78 y.o. 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 Found to have acute hypercarbic respiratory failure, acute hepatic encephalopathy, and hypothermia following procedure, prompting intubation. Transferred to MICU for further management. While there, she was extubated and weaned to room air. She was down-graded to step down status and transferred to [facility] for further management of encephalopathy, E coli bacteremia, and anticoagulation therapy. On 10/24, rapid response was called for increased somnolence/AMS, found to have acute hypercarbic respiratory failure prompting re-intubation.    Problems addressed during this consult include acute hypoxic hypercapneic respiratory failure and possible septic shock. Based on these problems, the patient has high risk of morbidity/mortality which is commensurate w their risk of management options described below in the recommendations.    Assessment      Interval Events: On 10/24, rapid response was called for increased somnolence, found to have acute hypercarbic respiratory failure prompting transfer to the CCU and re-intubation. Upon transfer to the CCU and peri-intubation, patient had PEA arrest s/p CPR with ROSC. Patient was started on NE and vasopressin for hypotension following sedation for intubation.        Impression: The patient has made worse clinical progress since last encounter. I personally reviewed most recent pertinent labs, imaging and micro data, from 03/25/2024, which are notable for significant hypercarbia with respiratory acidosis, as well as bilateral moderate pleural effusions with adjacent atelectasis and pulmonary edema on CXR.        Recommendations     Neuro: altered mental status with history of hepatic encephalopathy  Rapid response called on 10/24 for increased somnolence. Differential includes worsening hepatic encephalopathy, toxic metabolic encephalopathy in the setting of shock (possibly sepsis), versus intracranial pathology.   - Propofol  for sedation to allow for easy evaluation of neurologic status  - Goal RASS 0 to -1  - CT head pending  - Restart lactulose      2. Pulmonary: acute hypercarbic respiratory failure  Rapid response called on 10/24 for increased somnolence, found to have acute hypercarbic respiratory failure and subsequently intubated. Suspect hypercarbic respiratory failure due to encephalopathy as above.   - Continue mechanical ventilation with current settings (PRVC)  - Ventilation order bundle  - Obtain lower respiratory culture  - Trend ABG q6h     3. Cardiovascular: PEA arrest s/p ROSC; shock; atrial fibrillation, known left atrial appendage clot, HFpEF  Patient with PEA arrest peri-intubation with ROSC. Shock favored to be distributive in nature, possibly  sepsis. History of atrial fibrillation with known LAA clot and HFpEF.   - Hold Coreg  given hemodynamic instability, consider restarting once more clinically stable  - Hold Eliquis given hemodynamic instability, consider restarting once more clinically stable  - Hold GDMT given hemodynamic instability, consider restarting once more clinically stable  - Recommend adding vasopressin if NE requirement > 14  - Consider adding stress dose steroids if requiring 2nd pressor  - Wean norepinephrine as tolerated     4. Renal: hypernatremia   - Nephrology following  - Hold Jardiance  - Recheck sodium q6h  - Replace free water  deficit 75 ml/hr D5W for 24h  - Strict I/O's     5. 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 Given shock of unclear etiology, will assess CT A/P to identify possible infectious source.   - CT A/P pending  - Restart lactulose   - NPO for now  - Will place OG tube then start TF's in the AM  - Daily MELD labs and CMP    MELD 3.0: 12 at 03/24/2024  7:49 AM  MELD-Na: 9 at 03/24/2024  7:49 AM  Calculated from:  Serum Creatinine: 0.49 mg/dL (Using min of 1 mg/dL) at 89/76/7974  2:50 AM  Serum Sodium: 153 mmol/L (Using max of 137 mmol/L) at 03/24/2024  7:49 AM  Total Bilirubin: 1.2 mg/dL at 89/78/7974  3:69 AM  Serum Albumin: 2.4 g/dL at 89/78/7974  3:69 AM  INR(ratio): 1.17 at 03/22/2024  6:30 AM  Age at listing (hypothetical): 78 years  Sex: Female at 03/24/2024  7:49 AM     6. ID  Rapid response called 10/24, patient noted to be hypothermic. WBC 3.0 earlier this morning. UA with pyuria, rare bacteria, hematuria. Peri-intubation, patient developed significant hypotension requiring initiation of NE and vasopressin. Suspect possible septic shock with unknown infectious source. Patient started on vancomycin and cefepime 10/24.   - CT A/P pending  - continue vancomycin and cefepime for now  - follow up blood and urine cultures     7. Heme/coag: no active issues     8. 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 The recommendations outlined in this note were discussed w the primary team via phone. Please do not hesitate to page [phone] [facility] consult) with questions. We appreciate the opportunity to assist in the care of this patient. We look forward to following with you.    Gerard SHAUNNA Burkes, MD    Subjective & Objective     Vitals - past 24 hours  Temp:  [34.4 ??C (93.9 ??F)-36.3 ??C (97.3 ??F)] 34.4 ??C (93.9 ??F)  Pulse:  [51-142] 82  SpO2 Pulse:  [66-142] 88  Resp:  [8-113] 24  BP: (132-203)/(52-190) 146/73  FiO2 (%):  [70 %] 70 %  SpO2:  [91 %-100 %] 100 % Intake/Output  I/O last 3 completed shifts:  In: 100 [P.O.:100]  Out: 790 [Urine:790]      Pertinent exam findings:   General appearance - chronically ill appearing and and in no distress  Eyes - EOMI, PERRLA, anicteric sclerae, pink conjunctiva  Mouth - moist mucous membranes, no pharyngeal erythema or exudates  Neck - Trachea midline, normal neck movement  Lymphatics - no palpable lymphadenopathy  Heart - normal  rate, regular rhythm, normal S1, S2, no murmurs, rubs, clicks or gallops, regular rate and rhythm, normal S1/S2, no gallops, rubs, or murmurs  Chest - equal expansion, clear to auscultation, no wheezes, rhonchi, or rales  Abdomen - soft, nontender, nondistended, no masses or organomegaly  Extremities - No lower extremity edema, no clubbing or cyanosis  Skin - normal coloration and turgor, no rashes, no suspicious skin lesions noted  Neurological - moaning intermittently to sternal rub, moving lower extremities to pain, not withdrawing upper extremities to pain    Current vent settings:  PRVC (24/400/7/8) - recommend daily screening for SAT/SBT      Arterial Blood Gas:   No results for input(s): SPECTYPEART, PHART, PCO2ART, PO2ART, HCO3ART, BEART, O2SATART in the last 24 hours.     Venous Blood Gas:   Recent Labs     Units 03/25/24  1236 03/25/24  1400   PHVEN  7.26* 7.39   PCO2VEN mm Hg 79* 56   PO2VEN mm Hg 30* 170*   HCO3VEN mmol/L 29* 31*   BEVEN  8.2* 8.4*   O2SATVEN % 47.2 99.5*        Cultures:  Blood Culture, Routine (no units)   Date Value   03/11/2024 Escherichia coli (A)   03/11/2024 Escherichia coli (A)     WBC (10*9/L)   Date Value   03/25/2024 3.0 (L)     WBC, UA (/HPF)   Date Value   03/10/2024 1          Other Labs:  Lab Results   Component Value Date    WBC 3.0 (L) 03/25/2024    HGB 12.3 03/25/2024    HCT 36.8 03/25/2024    PLT 96 (L) 03/25/2024     Lab Results   Component Value Date    NA 148 (H) 03/25/2024    K 3.8 03/25/2024    CL 108 (H) 03/25/2024    CO2 34.5 (H) 03/25/2024    BUN 8 (L) 03/25/2024    CREATININE 0.48 (L) 03/25/2024    GLU 107 03/25/2024    CALCIUM 9.3 03/25/2024    MG 2.4 03/25/2024    PHOS 2.3 (L) 03/25/2024     Lab Results   Component Value Date    BILITOT 1.2 03/22/2024    BILIDIR 0.90 (H) 03/11/2024    PROT 5.5 (L) 03/22/2024    ALBUMIN 2.4 (L) 03/22/2024    ALT 13 03/22/2024    AST 32 03/22/2024    ALKPHOS 111 03/22/2024    GGT 31 04/08/2018     Lab Results   Component Value Date    INR 1.17 03/22/2024    APTT 105.6 (H) 03/21/2024       Allergies & Home Medications   Personally reviewed in Epic    Continuous Infusions:   Infusions Meds[1]    Scheduled Medications:   Scheduled Medications[2]    PRN medications:  PRN Medications[3]                    [1]    dextrose  75 mL/hr (03/25/24 1142)    NORepinephrine bitartrate-NS 30 mcg/min (03/25/24 1338)    propofol  10 mg/mL infusion 10 mcg/kg/min (03/25/24 1322)    vasopressin     [2]    anastrozole   1 mg Oral Daily    apixaban  5 mg Oral BID    carvedilol   3.125 mg Oral BID    cefepime  2 g Intravenous Once    And  cefepime  2 g Intravenous Q8H    cyanocobalamin (vitamin B-12)  1,000 mcg Oral Daily    [Provider Hold] empagliflozin  10 mg Oral Daily    famotidine  20 mg Enteral tube: gastric BID    flu vac 2025 [id number](PF)  0.5 mL Intramuscular During hospitalization    folic acid   1 mg Oral Daily    magnesium  oxide  400 mg Oral BID    melatonin  3 mg Oral QPM    multivitamins (ADULT)  1 tablet Oral Daily    pantoprazole  40 mg Oral Daily    phenylephrine        polyethylene glycol  17 g Oral BID    senna  2 tablet Oral BID    sodium chloride   3 mL Intravenous Q8H    spironolactone  25 mg Oral Daily    succinylcholine  1 mg/kg Intravenous Once    thiamine  mononitrate (vit B1)  100 mg Oral Daily    valsartan  40 mg Oral Daily    vancomycin  1,500 mg Intravenous Once    [START ON 03/26/2024] vancomycin  750 mg Intravenous Q12H   [3] acetaminophen , docusate sodium, fentaNYL  (PF) **OR** fentaNYL  (PF), haloperidol LACTATE, ondansetron , phenylephrine

## 2024-03-25 NOTE — Consults (Signed)
 Vancomycin Therapeutic Monitoring Pharmacy Note    [name] is a 78 y.o. female starting vancomycin. Date of therapy initiation: 03/26/24     Indication: Bacteremia/Sepsis    Prior Dosing Information: None/new initiation     Goals:  Therapeutic Drug Levels  Vancomycin trough goal: 10-15 mg/L    Additional Clinical Monitoring/Outcomes  Renal function, volume status (intake and output)    Results: Not applicable    Wt Readings from Last 1 Encounters:   03/25/24 88.2 kg (194 lb 7.1 oz)     Creatinine   Date Value Ref Range Status   03/25/2024 0.48 (L) 0.55 - 1.02 mg/dL Final   89/76/7974 9.50 (L) 0.55 - 1.02 mg/dL Final   89/77/7974 9.49 (L) 0.55 - 1.02 mg/dL Final        Pharmacokinetic Considerations and Significant Drug Interactions:  Adult (estimated initial): Vd = 62.6 L, ke = 0.049 hr-1  Concurrent nephrotoxic meds: not applicable    Assessment/Plan:  Recommendation(s)  Start vancomycin 1500 mg IV loading dose, then 750mg  IV q 12 hours  Estimated trough on recommended regimen: 15 mg/L    Follow-up  Level due: prior to fourth or fifth dose  A pharmacist will continue to monitor and order levels as appropriate    Please page service pharmacist with questions/clarifications.    DOROTHA Tanda Moats, Pharm D, BCPS, BCGP

## 2024-03-25 NOTE — Treatment Plan (Signed)
 Significant Event* Note    *Includes rapid response, code blue, acute worsening of clinical condition, need for bedside evaluation or urgent imaging, labs, consults, or therapeutics     Rapid response called  at 12:20PM for neuro status change.     Upon my arrival, patient was sitting in the chair agonal breathing, minimally responsive to sternal rub but able to withdraw all to pain.     Pertinent exam findings include decreased air movement on pulmonary exam, bradycardic and irregular on cardiac exam. Eyes closed, moaning, A&Ox0. Please see below for more details.    Vitals    Vitals:    03/25/24 1353   BP: 146/73   Pulse: 82   Resp: 24   Temp:    SpO2: 100%        Assessment/Plan:  My initial assessment suggests patient with acute decompensation from time of bedside rounding at 8:30am to 12:30pm. Differential at this time remains broad but includes new intracranial process (although patient had reassuring MRI overnight) vs hepatic encephalopathy vs sepsis among other etiologies. VBG obtained significant for acidosis and hypercarbia, prompting patient to be transferred to the ICU and intubated. Patient had code blue called for loss of pulse. CPR initiated and ROSC obtained after 5 minutes 5 minutes. Broad workup initiated. Pulmonology/critical care consulted and assisting with management.    Treatment and additional workup per below:  -1L LR  -Blood Cultures , VBG, ABG, Lactate, Ua, and CT Head Wo Contrast  and  Abdomen Pelvis   -Ammonia level  -Transfer to ICU    -Plan discussed with Primary RN, RR RN, Patient's family , and MICU team     Tinnie DELENA Carbine, MD  Internal Medicine/Pediatrics  PGY-3

## 2024-03-25 NOTE — Plan of Care (Signed)
 RT was at bedside for intubation with 7.5 ET Tracheal tube and remains on mechanical ventilation with PRVC 400 x 24, Peep 8, and 50% Fio2.    Problem: Mechanical Ventilation Invasive  Goal: Effective Communication  Outcome: Not Progressing  Goal: Optimal Device Function  Outcome: Not Progressing  Goal: Mechanical Ventilation Liberation  Outcome: Not Progressing  Goal: Optimal Nutrition Delivery  Outcome: Not Progressing  Goal: Absence of Device-Related Skin and Tissue Injury  Outcome: Not Progressing  Goal: Absence of Ventilator-Induced Lung Injury  Outcome: Not Progressing

## 2024-03-25 NOTE — Treatment Plan (Signed)
 Right sided central line found to be in arterial circulation. Catheter was removed and pressure was held for 15 minutes with quick clot. Hemostasis achieved with no hematoma. Dressing applied.    Wanda LITTIE Pippin, MD

## 2024-03-25 NOTE — Procedures (Signed)
 Arterial Line Insertion Procedure Note     Date of Service: 03/25/2024    Patient Name:: [name]  Patient MRN: [id number]    Indications: Hemodynamic monitoring    Consent:   Informed consent was NOT obtained. Procedure was performed emergently in setting of shock.    Procedure Details:   Time-out was performed immediately prior to the procedure to verify correct patient, procedure, site, positioning, and special equipment if applicable.    Dasie???s test was performed to ensure adequate perfusion. The patient???s left wrist was prepped and draped in sterile fashion. 1% Lidocaine  was used to anesthetize the area. A 2.5Fr, 2.5cm Cook line was introduced into the radial artery with ultrasound guidance. The catheter was threaded over the guide wire and the needle was removed with appropriate pulsatile blood return. The catheter was then sutured in place to the skin and a sterile CHG dressing applied. Perfusion to the extremity distal to the point of catheter insertion was checked and found to be adequate.  A pressure transducer was connected sterilely to the arterial line and an arterial line waveform was noted on the monitor.     Estimated Blood Loss: minimal ml    Condition:  The patient tolerated the procedure well and remains in the same condition as pre-procedure.    Complications:  The patient tolerated the procedure well and there were no complications.    Plan:  Use arterial line for blood pressure monitoring    Wanda LITTIE Pippin, MD

## 2024-03-25 NOTE — Progress Notes (Signed)
 Geriatrics (MEDA) Progress Note    Assessment & Plan:   [name] is a 78 y.o. 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 Found to have acute hypercarbic respiratory failure, acute hepatic encephalopathy, and hypothermia following procedure, prompting intubation. Transferred to MICU for further management. While there, she was extubated and weaned to room air. She was down-graded to step down status and transferred to [facility] for further management of encephalopathy, E coli bacteremia, and anticoagulation therapy.    Patient had remained clinically stable over the last week and working toward discharge. Had some increased confusion overnight on 10/22, head CT showed possible new hypoattenuation but follow up MRI only significant for chronic microvascular changes. Primary team rounded on patient around 8:30am and patient appeared clinically stable, A&Ox4, and able to get up out of bed and into the chair to eat breakfast. However, her mental status became gradually worse over the course of the morning and around 12:00pm, rapid response was called for encephalopathy. Please see separate significant event note for more details. Patient now intubated and sedated in the ICU. Workup thus far most concerning for hepatic encephalopathy given elevated ammonia to 160 and known hx of MASLD cirrhosis. She had previously been on lactulose , however this was discontinued at the time she was transferred to [facility] on 10/14 at the recommendation of hepatology. Had been A&O x3-4 since transfer to [facility]. Per spiritual care note, patient's daughter was concerned for worsening mental status although primary team was not aware of this concern and patient appeared at baseline on exam. Other etiologies that may be contributing to patient's toxic metabolic encephalopathy include infection vs electrolyte abnormalities given ongoing hypernatremia among other possibilities. Plan to restart lactulose  and monitor closely with pulm/crit care assisting with management.    Principal Problem:    Bacteremia, escherichia coli  Active Problems:    Alcoholic liver disease (HHS-HCC)    HTN (hypertension)    Depression with anxiety    Class 2 severe obesity with serious comorbidity in adult    Atrial fibrillation    (CMS-HCC)    Pleural effusion    Hypernatremia    Thrombocytopenia    Cirrhosis    (CMS-HCC)    A-fib (CMS-HCC)    Hepatic encephalopathy    (CMS-HCC)    # Toxic Metabolic Encephalopathy  Patient presented for scheduled TEE/DCCV with Cardiology on 10/10, received 50 mg propofol  and found to be difficult to wake following sedation with hypercarbic respiratory failure. Per chart review, initially poorly responsive to stimulation and started on NIPPV with BiPAP. Hypercarbia improved but remained altered eventually requiring intubation as below. CT Head 10/10 without acute intracranial process. Initial concern for hepatic encephalopathy has abated given lack of neurological symptoms and lactulose  was discontinued. Suspected likely multifactorial in the setting of hypercarbia, electrolyte abnormalities, infection. Initially improved until events as described above. Workup detailed below:  -CT head, abdomen, pelvis pending  -Restart lactulose  20g QID; titrate to 2-3 bowel movements daily  -Admitted to ICU  -Blood cultures pending  -UA and culture pending  -OG in place  -Cefepime/Vancomycin  -Intubated and sedated as below  -Delirium precautions    # Acute on Chronic Hypoxic and Hypercarbic Respiratory Failure (Resolved)  # Likely OSA  Presented with somnolence and difficulty  to rouse s/p TEE/DCCV sedation as above. Transferred to CCU w/ pulm consult and placed on NIPPV. CXR with slight pulmonary edema s/p diuresis. Hypercarbia corrected on BiPAP then AVAPS from 86 > 58 but ultimately required intubation due to acute hypoxic respiratory failure from hypoventilation likely due to encephalopathy. Extubated to [place] on 10/13 and now reintubated on 10/24 due to recurrent toxic metabolic encephalopathy.  Vent Mode: PRVC  FiO2 (%): 70 %  S RR: 24  S VT: 400 mL  PEEP: 8 cm H20  -ABG q6h  -Propfol gtt    # Hypotension  Likely in s/o sedation 2/2 AoCHRF.  -Norepi gtt, wean for MAP >65    # Hypernatremia  Suspected iso poor po intake. S/p post-pyloric corpak placement. Nephrology consulted today and most c/w hypovolemia. Most recently 153 on 10/22.  Serum Osms elevated at 309.  Given the increasing hypernatremia, plan to monitor closely.  Uncertain etiology at this time, but we will continue to pursue additional studies.  - D5W 75mL/hr for 24 hours  - Na level q6h  - Strict I/Os    # Constipation  Last bowel movement 10/20. Increased bowel regimen 10/23.  - Senna BID  - MiraLax BID  - Lactulose  as above    # Atrial Fibrillation - Left Atrial Appendage Thrombus (03/10/24)  New onset (03/2024), continued on home Coreg  3.125 mg BID. Started on Eliquis (cost-prohibitive due to not meeting deductible). Attempted TEE DCCV however aborted due to LAA thrombus. Have been unable to anticoagulate due to c/f GIB. Attempted to restart SQH ppx on 10/12-10/13, however patient had recurrent hematochezia and some light red secretions after extubation so all anticoagulation was held. Patient tolerated SQH trial and heparin gtt x48 hours and has been transitioned to Lovenox BID.  - HOLD Eliquis 5 mg BID in setting of GIB  - HOLD Lovenox 1mg /kg BID 10/20 - may restart if CT negative for intracranial bleeding   - Daily CBC   - Monitor for Hematochezia  - Cardiology consulted, appreciate recommendations  - Outpatient repeat TEE to assess for LAA thrombus resolution.    # Chronic HFpEF  # HTN  On admission, pro-BNP 520, slightly elevated from 490 at previous discharge but improved from 846 on 02/29/24. CXR 10/12 with interstitial edema and bilateral pulmonary effusions. GDMT initially held due to concern for acute infection, but regimen was gradually restarted as tolerated given her clinical improvement.  On physical exam, not demonstrating markers of  extreme volume overload at this time. Continuing to hold diuretics given patient is close to estimated dry weight, and her O2 sats and edema have been stable.  - Home GDMT  - BB: HOLD Coreg  3.125 mg BID, increase as needed. HR goal <110  - ARB/ACEi/ARNI: HOLD Valsartan to 40 mg daily  - MRA: HOLD Spironolactone 25 mg daily  - SGLT2i: HOLD Jardiance 10 mg daily  - HOLD Diuretics  - Daily Weights, EDW 195 lbs  - Strict I/Os  - Goal K >4, Mg >2    # Mild Cognitive Impairment  Comprehensive geriatric assessment completed on 03/22/2024 that showed mild cognitive improvement.  SLUMS was 21/30, though she is CAM negative.  Overall functional status is intact with the exception of complex care issues.  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 No hematemesis. Received variceal bleed ppx (PPI, ceftriaxone, and octreotide).  Low suspicion for variceal bleeding given HD stability and stable hemoglobin. Suspect related to colonic mass. Had recurrent hematochezia on 10/13 in the s/o challenge of subQ heparin, however has resolved since this was stopped. Per luminal GI 10/16, do not need CT GI bleed at this time as she is not actively bleeding. Plan to consult GI officially if bleeding begins. Hemoglobin has remained stable. Current plan is for outpatient colonoscopy for further workup, however may need to consider inpatient pending how current trial of subQ heparin goes.  - PO daily PPI  - Colonoscopy outpatient for incidental finding concerning for sigmoid colon malignancy     # MASLD Cirrhosis  # Hx Hepatic Encephalopathy  # Thrombocytopenia  History of decompensated metabolic-associated cirrhosis with HE, G1 EV and PHG on EGD 01/2018. Previously followed with hepatology but lost to follow up in 2020. Formerly prescribed lactulose  but has not been on it in several years. Presented altered and encephalopathic following sedation as above. Ammonia level elevated 192 -> 251 -> 82 from previous level of 30. Liver doppler US  negative for acute thrombosis. Had been receiving lactulose  enemas at OSH. Concern for development of GI bleed on 10/10 AM, GI consulted with low suspicion for variceal bleed, initially recommend placing NG tube for lactulose  administration to prevent further bleed. Most recently recommend stopping lactulose  given absence of asterixis and to avoid worsening electrolyte abnormalities and bowel urgency. See above for most recent events.  - Management as above  - Follow up with GI outpatient    MELD 3.0: 12 at 03/24/2024  7:49 AM  MELD-Na: 9 at 03/24/2024  7:49 AM  Calculated from:  Serum Creatinine: 0.49 mg/dL (Using min of 1 mg/dL) at 89/76/7974  2:50 AM  Serum Sodium: 153 mmol/L (Using max of 137 mmol/L) at 03/24/2024  7:49 AM  Total Bilirubin: 1.2 mg/dL at 89/78/7974  3:69 AM  Serum Albumin: 2.4 g/dL at 89/78/7974  3:69 AM  INR(ratio): 1.17 at 03/22/2024  6:30 AM  Age at listing (hypothetical): 78 years  Sex: Female at 03/24/2024  7:49 AM    Chronic Problems     # Vertebral Compression Fracture  T12 vertebral compression fracture with retropulsion into the ventral spinal canal on CT. Has known history of osteoporosis identified on Dexa scan of femoral neck (11/2022 T score -2.9).      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 Coli Bacteremia (Resolved)  Afebrile upon arrival to scheduled procedure via axillary temp, but found to be hypothermic to 34.1C via foley catheter. Otherwise, HDS without leukocytosis. Hypothermia improved with Bair Hugger. Eventually sedated and intubated as above for acute hypoxic respiratory failure and encephalopathy. No fluid pocket for diagnostic paracentesis on POCUS. CXR with interstitial edema and bilateral pulmonary effusions but no focal consolidation. UA noninfectious. Blood cultures 10/10 found to be positive for E. Coli. Required levophed briefly but remained stable after weaning off 10/12.  Completed treatment with Zosyn followed by Cefazolin  10/10-10/17.  Repeat blood cultures 10/12 were negative.  Unclear etiology though possible colonic source iso mass-like thickening involving the lower sigmoid colon (see below).        Mentation CAM: Overall CAM-ICU: Negative  6CIT:    Not yet completed (please touch base with nursing)  Delirium Order  Set: Ordered (appropriate for any patient >65)  Dementia: No.  Behavioral Symptoms (baseline or now): No.     Mobility Baseline functional status: Independent in ADLs  Current functional status: Needs Assistance with ADLs  PT/OT Ordered: Recommend AIR   Medications Reviewed home medications, screening for potentially inappropriate medications: Yes  Medications recommended de-prescribing: n/a   What Matters Geri Assessment complete: Yes see CGA note 03/22/2024         Issues Impacting Complexity of Management:  -Intensive monitoring of drug toxicity from Zofran  with EKG to monitor QTc and cefazolin  with BMP  -High risk of complications from pain and/or analgesia likely to result in delirium  -The patient is at high risk from Hospital immobility in an elderly patient given baseline poor functional status with a high risk of causing delirium and further decline in function  -The patient is at high risk for the development of complications of volume overload due to the need to provide IV hydration for suspected hypovolemia in the setting of: heart failure and Cirrhosis  -Need for the following intensive monitoring parameter(s) due to high risk of clinical decline: frequent monitoring of urine output to assess for the efficacy of diuretic regimen  -The patient is at high risk of complications from delirium      Daily Checklist:  Diet: GI Softs  DVT PPx: Contraindicated - High Risk for Bleeding/Active Bleeding; however, patient is hypercoagulable  Electrolytes: Replete Potassium to >/=4 and Magnesium  to >/=2  Code Status: Full Code  Dispo: AIR    Team Contact Information:   Primary Team: Geriatrics ([facility]A)  Primary Resident: Tinnie DELENA Carbine, MD  Resident's Pager: [phone] (Geriatrics Intern - [place])    Interval History:   See above and separate significant event note for more details.      Objective:   Temp:  [35.3 ??C (95.5 ??F)-36.2 ??C (97.2 ??F)] 36.2 ??C (97.2 ??F)  Pulse:  [51-64] 51  Resp:  [17-20] 20  BP: (150-173)/(70-113) 158/75  SpO2:  [93 %-99 %] 95 %    Gen: Intubated and sedated  Eyes: sclera anicteric  HENT: MMM  Heart: RRR, well-perfused, no murmurs, rubs, or gallops, no visible pulse at the ears  Lungs: diminished sounds at the bases bilaterally  Abdomen: soft, NTND; non-shifting LUQ edema  GU: Dark golden yellow urine in collecting tube  Extremities: trace edema in the bilateral lower extremities foot to knee with slight edema at the hips  Psych: Alert and Oriented x3 (person, situation, location (hospital, but said [place]))    Labs/Studies: Labs and Studies from the last 24hrs per EMR and Reviewed    Tinnie DELENA Carbine, MD  Internal Medicine/Pediatrics  PGY-3

## 2024-03-25 NOTE — Procedures (Signed)
 Central Venous Catheter Insertion Procedure Note (CPT [phone] and [id number])    Pre-procedural Planning     Patient Name:: [name]  Patient MRN: [id number]    Line type:  Triple Lumen    Indications:  Inadequate peripheral access    Known Bleeding Diathesis: Patient/caregiver denies any known bleeding or platelet disorder.     Antiplatelet Agents: This patient is not on an antiplatelet agent.    Systemic Anticoagulation: This patient is on full systemic anticoagulation.    Significant Labs:  INR   Date Value Ref Range Status   03/22/2024 1.17  Final     PT   Date Value Ref Range Status   03/22/2024 13.3 (H) 9.9 - 12.6 sec Final     APTT   Date Value Ref Range Status   03/21/2024 105.6 (H) 24.8 - 38.4 sec Final     Platelet   Date Value Ref Range Status   03/25/2024 96 (L) 150 - 450 10*9/L Final       Consent: Informed consent was obtained after explanation of the risks (including arterial injury, pneumothorax, infection, and bleeding) and benefits of the procedure. Refer to the consent documentation.    Procedure Details     Time-out was performed immediately prior to the procedure.    The right internal jugular vein was identified using bedside ultrasound. This area was prepped and draped in the usual sterile fashion. Maximum sterile technique was used including antiseptics, cap, gloves, gown, hand hygiene, mask, and sterile sheet.  The patient was placed in Trendelenburg position. Local anesthesia with 1% lidocaine  was applied subcutaneously then deep to the skin. The angiocath was then inserted into the internal jugular vein using ultrasound guidance. The angiocatheter placement was confirmed by manometry prior to dilation.    Using the Seldinger Technique a Triple Lumen was placed with each port easily flushed and freely drawing venous blood.    The catheter was secured with sutures, CHG dressings applied over the site.    A CVAD liaison team member was not present.     Condition     The patient tolerated the procedure well and remains in the same condition as pre-procedure.    Complications and Recommendations     Complications:  None; patient tolerated the procedure well.    Plan:    The ultrasound was subsequently used to evaluate for pneumothorax.     A chest x-ray was ordered to verify catheter positioning.    Resident(s) Performing Procedure: Gerard Burkes, MD  Resident Year: PGY2

## 2024-03-26 LAB — CBC
HEMATOCRIT: 28.9 % — ABNORMAL LOW (ref 34.0–44.0)
HEMATOCRIT: 23.8 % — ABNORMAL LOW (ref 34.0–44.0)
HEMATOCRIT: 24.2 % — ABNORMAL LOW (ref 34.0–44.0)
HEMATOCRIT: 24.5 % — ABNORMAL LOW (ref 34.0–44.0)
HEMOGLOBIN: 9.8 g/dL — ABNORMAL LOW (ref 11.3–14.9)
HEMOGLOBIN: 7.9 g/dL — ABNORMAL LOW (ref 11.3–14.9)
HEMOGLOBIN: 8.3 g/dL — ABNORMAL LOW (ref 11.3–14.9)
HEMOGLOBIN: 8.2 g/dL — ABNORMAL LOW (ref 11.3–14.9)
MEAN CORPUSCULAR HEMOGLOBIN CONC: 34 g/dL (ref 32.0–36.0)
MEAN CORPUSCULAR HEMOGLOBIN CONC: 33.4 g/dL (ref 32.0–36.0)
MEAN CORPUSCULAR HEMOGLOBIN CONC: 34 g/dL (ref 32.0–36.0)
MEAN CORPUSCULAR HEMOGLOBIN CONC: 33.3 g/dL (ref 32.0–36.0)
MEAN CORPUSCULAR HEMOGLOBIN: 32.9 pg — ABNORMAL HIGH (ref 25.9–32.4)
MEAN CORPUSCULAR HEMOGLOBIN: 32.6 pg — ABNORMAL HIGH (ref 25.9–32.4)
MEAN CORPUSCULAR HEMOGLOBIN: 33.2 pg — ABNORMAL HIGH (ref 25.9–32.4)
MEAN CORPUSCULAR HEMOGLOBIN: 32.7 pg — ABNORMAL HIGH (ref 25.9–32.4)
MEAN CORPUSCULAR VOLUME: 96.8 fL — ABNORMAL HIGH (ref 77.6–95.7)
MEAN CORPUSCULAR VOLUME: 97.7 fL — ABNORMAL HIGH (ref 77.6–95.7)
MEAN CORPUSCULAR VOLUME: 97.4 fL — ABNORMAL HIGH (ref 77.6–95.7)
MEAN CORPUSCULAR VOLUME: 98.1 fL — ABNORMAL HIGH (ref 77.6–95.7)
MEAN PLATELET VOLUME: 9.1 fL (ref 6.8–10.7)
MEAN PLATELET VOLUME: 8.9 fL (ref 6.8–10.7)
MEAN PLATELET VOLUME: 9.4 fL (ref 6.8–10.7)
MEAN PLATELET VOLUME: 9.4 fL (ref 6.8–10.7)
PLATELET COUNT: 106 10*9/L — ABNORMAL LOW (ref 150–450)
PLATELET COUNT: 90 10*9/L — ABNORMAL LOW (ref 150–450)
PLATELET COUNT: 91 10*9/L — ABNORMAL LOW (ref 150–450)
PLATELET COUNT: 96 10*9/L — ABNORMAL LOW (ref 150–450)
RED BLOOD CELL COUNT: 2.98 10*12/L — ABNORMAL LOW (ref 3.95–5.13)
RED BLOOD CELL COUNT: 2.43 10*12/L — ABNORMAL LOW (ref 3.95–5.13)
RED BLOOD CELL COUNT: 2.49 10*12/L — ABNORMAL LOW (ref 3.95–5.13)
RED BLOOD CELL COUNT: 2.49 10*12/L — ABNORMAL LOW (ref 3.95–5.13)
RED CELL DISTRIBUTION WIDTH: 18.8 % — ABNORMAL HIGH (ref 12.2–15.2)
RED CELL DISTRIBUTION WIDTH: 18.8 % — ABNORMAL HIGH (ref 12.2–15.2)
RED CELL DISTRIBUTION WIDTH: 18.4 % — ABNORMAL HIGH (ref 12.2–15.2)
RED CELL DISTRIBUTION WIDTH: 18.4 % — ABNORMAL HIGH (ref 12.2–15.2)
WBC ADJUSTED: 8.5 10*9/L (ref 3.6–11.2)
WBC ADJUSTED: 7.9 10*9/L (ref 3.6–11.2)
WBC ADJUSTED: 8.4 10*9/L (ref 3.6–11.2)
WBC ADJUSTED: 9.3 10*9/L (ref 3.6–11.2)

## 2024-03-26 LAB — BASIC METABOLIC PANEL
ANION GAP: 9 mmol/L (ref 5–14)
BLOOD UREA NITROGEN: 9 mg/dL (ref 9–23)
BUN / CREAT RATIO: 15
CALCIUM: 8.3 mg/dL — ABNORMAL LOW (ref 8.7–10.4)
CHLORIDE: 108 mmol/L — ABNORMAL HIGH (ref 98–107)
CO2: 30 mmol/L (ref 20.0–31.0)
CREATININE: 0.61 mg/dL (ref 0.55–1.02)
EGFR CKD-EPI (2021) FEMALE: >90 mL/min/1.73m2 (ref >=60)
GLUCOSE RANDOM: 141 mg/dL (ref 70–179)
POTASSIUM: 3.5 mmol/L (ref 3.4–4.8)
SODIUM: 147 mmol/L — ABNORMAL HIGH (ref 135–145)

## 2024-03-26 LAB — BLOOD GAS CRITICAL CARE PANEL, ARTERIAL
BASE EXCESS ARTERIAL: 5.6 — ABNORMAL HIGH (ref -2.0–2.0)
BASE EXCESS ARTERIAL: 7.8 — ABNORMAL HIGH (ref -2.0–2.0)
BASE EXCESS ARTERIAL: 5.6 — ABNORMAL HIGH (ref -2.0–2.0)
BASE EXCESS ARTERIAL: 4.6 — ABNORMAL HIGH (ref -2.0–2.0)
CALCIUM IONIZED ARTERIAL (MG/DL): 4.79 mg/dL (ref 4.40–5.40)
CALCIUM IONIZED ARTERIAL (MG/DL): 4.84 mg/dL (ref 4.40–5.40)
CALCIUM IONIZED ARTERIAL (MG/DL): 4.7 mg/dL (ref 4.40–5.40)
CALCIUM IONIZED ARTERIAL (MG/DL): 4.79 mg/dL (ref 4.40–5.40)
CARBOXYHEMOGLOBIN: 2 % — ABNORMAL HIGH (ref <1.2)
CARBOXYHEMOGLOBIN: 1.4 % — ABNORMAL HIGH (ref <1.2)
CARBOXYHEMOGLOBIN: 1.2 % — ABNORMAL HIGH (ref <1.2)
CARBOXYHEMOGLOBIN: 2.1 % — ABNORMAL HIGH (ref <1.2)
CHLORIDE, WHOLE BLOOD: 110 mmol/L — ABNORMAL HIGH (ref 98–107)
CHLORIDE, WHOLE BLOOD: 111 mmol/L — ABNORMAL HIGH (ref 98–107)
CHLORIDE, WHOLE BLOOD: 110 mmol/L — ABNORMAL HIGH (ref 98–107)
GLUCOSE WHOLE BLOOD: 135 mg/dL (ref 70–179)
GLUCOSE WHOLE BLOOD: 169 mg/dL (ref 70–179)
GLUCOSE WHOLE BLOOD: 189 mg/dL — ABNORMAL HIGH (ref 70–179)
GLUCOSE WHOLE BLOOD: 186 mg/dL — ABNORMAL HIGH (ref 70–179)
HCO3 ARTERIAL: 30 mmol/L — ABNORMAL HIGH (ref 22–27)
HCO3 ARTERIAL: 32 mmol/L — ABNORMAL HIGH (ref 22–27)
HCO3 ARTERIAL: 30 mmol/L — ABNORMAL HIGH (ref 22–27)
HCO3 ARTERIAL: 29 mmol/L — ABNORMAL HIGH (ref 22–27)
HEMOGLOBIN BLOOD GAS: 9.9 g/dL — ABNORMAL LOW (ref 12.00–16.00)
HEMOGLOBIN BLOOD GAS: 10.4 g/dL — ABNORMAL LOW (ref 12.00–16.00)
HEMOGLOBIN BLOOD GAS: 8.2 g/dL — ABNORMAL LOW (ref 12.00–16.00)
HEMOGLOBIN BLOOD GAS: 8 g/dL — ABNORMAL LOW (ref 12.00–16.00)
LACTATE BLOOD ARTERIAL: 1.8 mmol/L — ABNORMAL HIGH (ref <1.3)
LACTATE BLOOD ARTERIAL: 2.1 mmol/L — ABNORMAL HIGH (ref <1.3)
LACTATE BLOOD ARTERIAL: 2.1 mmol/L — ABNORMAL HIGH (ref <1.3)
LACTATE BLOOD ARTERIAL: 2.1 mmol/L — ABNORMAL HIGH (ref <1.3)
METHEMOGLOBIN: <1 % (ref <1.5)
METHEMOGLOBIN: <1 % (ref <1.5)
METHEMOGLOBIN: <1 % (ref <1.5)
METHEMOGLOBIN: <1 % (ref <1.5)
O2 SATURATION ARTERIAL: 99.6 % (ref 94.0–100.0)
O2 SATURATION ARTERIAL: 99.4 % (ref 94.0–100.0)
O2 SATURATION ARTERIAL: 99.6 % (ref 94.0–100.0)
O2 SATURATION ARTERIAL: 99.9 % (ref 94.0–100.0)
OXYHEMOGLOBIN: 96.9 % (ref 94.0–100.0)
OXYHEMOGLOBIN: 97.7 % (ref 94.0–100.0)
OXYHEMOGLOBIN: 97.9 % (ref 94.0–100.0)
OXYHEMOGLOBIN: 97.2 % (ref 94.0–100.0)
PCO2 ARTERIAL: 46.5 mmHg — ABNORMAL HIGH (ref 35.0–45.0)
PCO2 ARTERIAL: 41.7 mmHg (ref 35.0–45.0)
PCO2 ARTERIAL: 45.2 mmHg — ABNORMAL HIGH (ref 35.0–45.0)
PCO2 ARTERIAL: 41.6 mmHg (ref 35.0–45.0)
PH ARTERIAL: 7.42 (ref 7.35–7.45)
PH ARTERIAL: 7.49 — ABNORMAL HIGH (ref 7.35–7.45)
PH ARTERIAL: 7.44 (ref 7.35–7.45)
PH ARTERIAL: 7.45 (ref 7.35–7.45)
PO2 ARTERIAL: 130 mmHg — ABNORMAL HIGH (ref 80.0–110.0)
PO2 ARTERIAL: 109 mmHg (ref 80.0–110.0)
PO2 ARTERIAL: 137 mmHg — ABNORMAL HIGH (ref 80.0–110.0)
PO2 ARTERIAL: 139 mmHg — ABNORMAL HIGH (ref 80.0–110.0)
POTASSIUM WHOLE BLOOD: 3.2 mmol/L — ABNORMAL LOW (ref 3.4–4.6)
POTASSIUM WHOLE BLOOD: 3.9 mmol/L (ref 3.4–4.6)
POTASSIUM WHOLE BLOOD: 3.7 mmol/L (ref 3.4–4.6)
POTASSIUM WHOLE BLOOD: 4 mmol/L (ref 3.4–4.6)
SODIUM WHOLE BLOOD: 144 mmol/L (ref 135–145)
SODIUM WHOLE BLOOD: 143 mmol/L (ref 135–145)
SODIUM WHOLE BLOOD: 142 mmol/L (ref 135–145)
SODIUM WHOLE BLOOD: 140 mmol/L (ref 135–145)

## 2024-03-26 LAB — CBC W/ AUTO DIFF
BASOPHILS ABSOLUTE COUNT: 0 10*9/L (ref 0.0–0.1)
BASOPHILS RELATIVE PERCENT: 0.2 %
EOSINOPHILS ABSOLUTE COUNT: 0.2 10*9/L (ref 0.0–0.5)
EOSINOPHILS RELATIVE PERCENT: 2.8 %
HEMATOCRIT: 29.6 % — ABNORMAL LOW (ref 34.0–44.0)
HEMOGLOBIN: 10 g/dL — ABNORMAL LOW (ref 11.3–14.9)
LYMPHOCYTES ABSOLUTE COUNT: 0.9 10*9/L — ABNORMAL LOW (ref 1.1–3.6)
LYMPHOCYTES RELATIVE PERCENT: 10.4 %
MEAN CORPUSCULAR HEMOGLOBIN CONC: 33.8 g/dL (ref 32.0–36.0)
MEAN CORPUSCULAR HEMOGLOBIN: 33.6 pg — ABNORMAL HIGH (ref 25.9–32.4)
MEAN CORPUSCULAR VOLUME: 99.4 fL — ABNORMAL HIGH (ref 77.6–95.7)
MEAN PLATELET VOLUME: 8.1 fL (ref 6.8–10.7)
MONOCYTES ABSOLUTE COUNT: 1 10*9/L — ABNORMAL HIGH (ref 0.3–0.8)
MONOCYTES RELATIVE PERCENT: 11.7 %
NEUTROPHILS ABSOLUTE COUNT: 6.3 10*9/L (ref 1.8–7.8)
NEUTROPHILS RELATIVE PERCENT: 74.9 %
PLATELET COUNT: 101 10*9/L — ABNORMAL LOW (ref 150–450)
RED BLOOD CELL COUNT: 2.98 10*12/L — ABNORMAL LOW (ref 3.95–5.13)
RED CELL DISTRIBUTION WIDTH: 18.3 % — ABNORMAL HIGH (ref 12.2–15.2)
WBC ADJUSTED: 8.5 10*9/L (ref 3.6–11.2)

## 2024-03-26 LAB — HEPATIC FUNCTION PANEL
ALBUMIN: 1.8 g/dL — ABNORMAL LOW (ref 3.4–5.0)
ALKALINE PHOSPHATASE: 83 U/L (ref 46–116)
ALT (SGPT): 18 U/L (ref 10–49)
AST (SGOT): 33 U/L (ref <=34)
BILIRUBIN DIRECT: 0.7 mg/dL — ABNORMAL HIGH (ref 0.00–0.30)
BILIRUBIN TOTAL: 1.3 mg/dL — ABNORMAL HIGH (ref 0.3–1.2)
PROTEIN TOTAL: 4.2 g/dL — ABNORMAL LOW (ref 5.7–8.2)

## 2024-03-26 LAB — MAGNESIUM: MAGNESIUM: 2 mg/dL (ref 1.6–2.6)

## 2024-03-26 LAB — SODIUM
SODIUM: 148 mmol/L — ABNORMAL HIGH (ref 135–145)
SODIUM: 147 mmol/L — ABNORMAL HIGH (ref 135–145)
SODIUM: 146 mmol/L — ABNORMAL HIGH (ref 135–145)

## 2024-03-26 LAB — PHOSPHORUS: PHOSPHORUS: 2 mg/dL — ABNORMAL LOW (ref 2.4–5.1)

## 2024-03-26 LAB — AST: AST (SGOT): 42 U/L — ABNORMAL HIGH (ref <=34)

## 2024-03-26 LAB — POTASSIUM: POTASSIUM: 3.4 mmol/L (ref 3.4–4.8)

## 2024-03-26 MED ADMIN — vasopressin 20 units in 100 mL (0.2 units/mL) infusion premade vial: .03 [IU]/min | INTRAVENOUS

## 2024-03-26 MED ADMIN — vasopressin 20 units in 100 mL (0.2 units/mL) infusion premade vial: .03 [IU]/min | INTRAVENOUS | @ 22:00:00

## 2024-03-26 MED ADMIN — vasopressin 20 units in 100 mL (0.2 units/mL) infusion premade vial: .03 [IU]/min | INTRAVENOUS | @ 11:00:00

## 2024-03-26 MED ADMIN — vancomycin (VANCOCIN) 750 mg in dextrose 5 % 150 mL IVPB (premix): 750 mg | INTRAVENOUS | @ 15:00:00 | Stop: 2024-03-29

## 2024-03-26 MED ADMIN — vancomycin (VANCOCIN) 750 mg in dextrose 5 % 150 mL IVPB (premix): 750 mg | INTRAVENOUS | @ 22:00:00 | Stop: 2024-03-29

## 2024-03-26 MED ADMIN — propofol (DIPRIVAN) infusion 10 mg/mL: 0-50 ug/kg/min | INTRAVENOUS | @ 01:00:00

## 2024-03-26 MED ADMIN — propofol (DIPRIVAN) infusion 10 mg/mL: 0-50 ug/kg/min | INTRAVENOUS | @ 14:00:00

## 2024-03-26 MED ADMIN — octreotide (SandoSTATIN) injection 50 mcg: 50 ug | INTRAVENOUS | @ 04:00:00 | Stop: 2024-03-26

## 2024-03-26 MED ADMIN — pantoprazole (Protonix) injection 40 mg: 40 mg | INTRAVENOUS | @ 14:00:00

## 2024-03-26 MED ADMIN — sodium chloride (NS) 0.9 % flush 10 mL: 10 mL | INTRAVENOUS | @ 10:00:00

## 2024-03-26 MED ADMIN — sodium chloride (NS) 0.9 % flush 10 mL: 10 mL | INTRAVENOUS | @ 18:00:00

## 2024-03-26 MED ADMIN — NORepinephrine 8 mg in dextrose 5 % 250 mL (32 mcg/mL) infusion PMB: 0-30 ug/min | INTRAVENOUS | @ 12:00:00

## 2024-03-26 MED ADMIN — NORepinephrine 8 mg in dextrose 5 % 250 mL (32 mcg/mL) infusion PMB: 0-30 ug/min | INTRAVENOUS | @ 18:00:00

## 2024-03-26 MED ADMIN — NORepinephrine 8 mg in dextrose 5 % 250 mL (32 mcg/mL) infusion PMB: 0-30 ug/min | INTRAVENOUS | @ 01:00:00

## 2024-03-26 MED ADMIN — folic acid (FOLVITE) tablet 1 mg: 1 mg | ORAL | @ 14:00:00

## 2024-03-26 MED ADMIN — cefepime (MAXIPIME) 2 g in sodium chloride 0.9 % (NS) 100 mL IVPB-MBP: 2 g | INTRAVENOUS | @ 18:00:00 | Stop: 2024-03-28

## 2024-03-26 MED ADMIN — cefepime (MAXIPIME) 2 g in sodium chloride 0.9 % (NS) 100 mL IVPB-MBP: 2 g | INTRAVENOUS | @ 04:00:00 | Stop: 2024-03-28

## 2024-03-26 MED ADMIN — cefepime (MAXIPIME) 2 g in sodium chloride 0.9 % (NS) 100 mL IVPB-MBP: 2 g | INTRAVENOUS | @ 10:00:00 | Stop: 2024-03-28

## 2024-03-26 MED ADMIN — fentaNYL (PF) (SUBLIMAZE) injection 50 mcg: 50 ug | INTRAVENOUS | @ 16:00:00 | Stop: 2024-04-01

## 2024-03-26 MED ADMIN — cyanocobalamin (vitamin B-12) tablet 1,000 mcg: 1000 ug | ORAL | @ 14:00:00

## 2024-03-26 MED ADMIN — sodium chloride (NS) 0.9 % flush 3 mL: 3 mL | INTRAVENOUS | @ 16:00:00

## 2024-03-26 MED ADMIN — octreotide 500 mcg in sodium chloride 0.9 % 100 mL (5 mcg/mL) infusion: 50 ug/h | INTRAVENOUS | @ 04:00:00

## 2024-03-26 MED ADMIN — octreotide 500 mcg in sodium chloride 0.9 % 100 mL (5 mcg/mL) infusion: 50 ug/h | INTRAVENOUS | @ 15:00:00

## 2024-03-26 MED ADMIN — calcium gluconate in sodium chloride (NS) 0.9% 2 gram/100 mL IVPB 2 g: 2 g | INTRAVENOUS | @ 09:00:00 | Stop: 2024-03-26

## 2024-03-26 MED ADMIN — iohexol (OMNIPAQUE) 350 mg iodine/mL solution 150 mL: 150 mL | INTRAVENOUS | @ 07:00:00 | Stop: 2024-03-26

## 2024-03-26 MED ADMIN — famotidine (PEPCID) tablet 20 mg: 20 mg | GASTROENTERAL | @ 04:00:00

## 2024-03-26 MED ADMIN — magnesium oxide (MAG-OX) tablet 400 mg: 400 mg | ORAL | @ 04:00:00

## 2024-03-26 MED ADMIN — EPINEPHrine 8 mg in dextrose 5% 250 mL (32 mcg/mL) infusion PMB: 0-20 ug/min | INTRAVENOUS | @ 16:00:00

## 2024-03-26 MED ADMIN — thiamine mononitrate (vit B1) tablet 100 mg: 100 mg | ORAL | @ 14:00:00

## 2024-03-26 MED ADMIN — EPINEPHrine HCL in 5% dextrose 8 mg/250 mL (32 mcg/mL) infusion: @ 16:00:00 | Stop: 2024-03-26

## 2024-03-26 MED ADMIN — polyethylene glycol (MIRALAX) packet 17 g: 17 g | ORAL | @ 04:00:00

## 2024-03-26 NOTE — Plan of Care (Signed)
 Problem: Mechanical Ventilation Invasive  Goal: Effective Communication  Outcome: Progressing  Goal: Optimal Device Function  Outcome: Progressing  Intervention: Optimize Device Care and Function  Flowsheets  Taken 03/26/2024 1814  Airway/Ventilation Management:   airway patency maintained   humidification applied   pulmonary hygiene promoted  Airway Safety Measures:   manual resuscitator/mask at bedside   oxygen flowmeter at bedside   suction at bedside   high-efficiency antimicrobial filters maintained  Taken 03/26/2024 0830  Oral Care:   mouth swabbed   suction provided   teeth brushed   tongue brushed  Goal: Mechanical Ventilation Liberation  Outcome: Progressing  Goal: Optimal Nutrition Delivery  Outcome: Progressing  Goal: Absence of Device-Related Skin and Tissue Injury  Outcome: Progressing  Goal: Absence of Ventilator-Induced Lung Injury  Outcome: Progressing  Intervention: Prevent Ventilator-Associated Pneumonia  Recent Flowsheet Documentation  Taken 03/26/2024 0830 by Dewight Counts D, RRT  Oral Care:   mouth swabbed   suction provided   teeth brushed   tongue brushed     Problem: Breathing Pattern Ineffective  Goal: Effective Breathing Pattern  Intervention: Promote Improved Breathing Pattern  Recent Flowsheet Documentation  Taken 03/26/2024 1814 by Dewight Counts D, RRT  Airway/Ventilation Management:   airway patency maintained   humidification applied   pulmonary hygiene promoted     Problem: Gas Exchange Impaired  Goal: Optimal Gas Exchange  Intervention: Optimize Oxygenation and Ventilation  Recent Flowsheet Documentation  Taken 03/26/2024 1814 by Dewight Counts D, RRT  Airway/Ventilation Management:   airway patency maintained   humidification applied   pulmonary hygiene promoted     Problem: Mechanical Ventilation Invasive  Goal: Optimal Device Function  Intervention: Optimize Device Care and Function  Recent Flowsheet Documentation  Taken 03/26/2024 1814 by Dewight Counts D, RRT  Airway/Ventilation Management:   airway patency maintained   humidification applied   pulmonary hygiene promoted  Airway Safety Measures:   manual resuscitator/mask at bedside   oxygen flowmeter at bedside   suction at bedside   high-efficiency antimicrobial filters maintained  Taken 03/26/2024 0830 by Dewight Counts D, RRT  Oral Care:   mouth swabbed   suction provided   teeth brushed   tongue brushed  Goal: Absence of Ventilator-Induced Lung Injury  Intervention: Prevent Ventilator-Associated Pneumonia  Recent Flowsheet Documentation  Taken 03/26/2024 0830 by Dewight Counts D, RRT  Oral Care:   mouth swabbed   suction provided   teeth brushed   tongue brushed     Problem: Mechanical Ventilation Invasive  Goal: Optimal Device Function  Intervention: Optimize Device Care and Function  Recent Flowsheet Documentation  Taken 03/26/2024 1814 by Dewight Counts D, RRT  Airway/Ventilation Management:   airway patency maintained   humidification applied   pulmonary hygiene promoted  Airway Safety Measures:   manual resuscitator/mask at bedside   oxygen flowmeter at bedside   suction at bedside   high-efficiency antimicrobial filters maintained  Taken 03/26/2024 0830 by Dewight Counts D, RRT  Oral Care:   mouth swabbed   suction provided   teeth brushed   tongue brushed  Goal: Absence of Ventilator-Induced Lung Injury  Intervention: Prevent Ventilator-Associated Pneumonia  Recent Flowsheet Documentation  Taken 03/26/2024 0830 by Dewight Counts D, RRT  Oral Care:   mouth swabbed   suction provided   teeth brushed   tongue brushed

## 2024-03-26 NOTE — Treatment Plan (Signed)
 Hepatology Consult Service   Treatment Plan         Assessment and Recommendations:   [name] is a 78 y.o. female with a PMHx of HFpEF, HTN, Afib on AC, MASLD cirrhosis who presented to [facility] with originally hospitalized for scheduled TEE/DCCV (aborted d/t LAA clot) c/b AHHRF, encephalopathy and septic shock requiring MICU care. Extubated and transferred to [facility] but had to intubated and transferred back to [facility] MICU for closer monitoring for respiratory status, pressor requirement, and management of carotid artery injury. The patient is seen in consultation at the request of Kieran G Leong, DO (Medical ICU (MDI)) for UGIB.    #UGIB from Mallory-Weiss Tear  EGD done today, see report for full details. 1.5L of clot and frank blood suctioned. 10mm bleeding Mallory-Weiss tear found and clipped with Ovesco device. Exam was otherwise normal. OG tube replaced.  -KUB to confirm enteral OG tube placement  -Continue IV PPI BID  -Monitor hgb q4h  -If stable for 24 hours, can proceed with planned operation with vascular and full AC         #MASLD cirrhosis  #Possible HE  Patient previously seen by our service for possible HE. There was c/f HE and patient was on lactulose  but it was stopped due to no asterixis on exam. Patient had PEA as above. Currently intubated and sedated. EGD as above. No esophageal varices noted.   -Would continue to hold lactulose          Recommendations discussed with the patient's primary team. We will continue to follow along with you.

## 2024-03-26 NOTE — Progress Notes (Signed)
 ICU TRANSPORT NOTE    Destination: CT    Departing Unit: MICU  Pickup Time: 0150    Return Unit: MICU  Return Time: 9     patient ID band verified  Allergies Reviewed  Code Status at time of transport: Full    Report received from primary nurse via [employer]. Handoff performed of continuous drip/infusion Patient transported via stretcher under ICU level of care. See vital signs during transport via [employer]. O2 via Ventilator @ 35 %. Patient is patient vented and sedated tolerated procedure/scan well. Universal, Aspiration, and Fall precautions maintained throughout transport.Pt.remains on propofol , vasopressin,and norepinephrine.Pt.dropped BP briefly into the low 80's once pt.was moved to CT table, norepinephrine increased briefly to achieved map >65.    Update and care given to primary nurse. See Doc Flowsheets/MAR for additional transportation documentation. Proper body mechanics and safe patient handling equipment were utilized throughout transport.

## 2024-03-26 NOTE — Consults (Signed)
 Hepatology Consult Service   Initial Consultation         Assessment and Recommendations:   [name] is a 78 y.o. female with a PMHx of HFpEF, HTN, Afib on AC, MASLD cirrhosis who presented to [facility] with originally hospitalized for scheduled TEE/DCCV (aborted d/t LAA clot) c/b AHHRF, encephalopathy and septic shock requiring MICU care. Extubated and transferred to [facility] but had to intubated and transferred back to [facility] MICU for closer monitoring for respiratory status, pressor requirement, and management of carotid artery injury. The patient is seen in consultation at the request of Kieran G Leong, DO (Medical ICU (MDI)) for UGIB.    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 Rapid called 10/24 and patient found to be in PEA and needing re-intubation. CVC placed afterwards but inserted to carotid artery. Removed and patient transferred to main. CTA neck showing RIJ-carotid fistula and CTA AP showing active extravasation was found to be in gastric lumen likely from small branch of left gastric artery. Case discussed with VIR and GI requested to do endoscopic evaluation. GIB needs to be controlled prior to planned surgery w/ vascular. Will proceed with EGD today.  -EGD today      #MASLD cirrhosis  #Possible HE  Patient previously seen by our service for possible HE. There was c/f HE and patient was on lactulose  but it was stopped due to no asterixis on exam. Patient had PEA as above. Currently intubated and sedated. Will address bleeding as above.   -Would continue to hold lactulose         MELD 3.0: 13 at 03/26/2024  3:07 AM  MELD-Na: 9 at 03/26/2024  3:07 AM  Calculated from:  Serum Creatinine: 0.61 mg/dL (Using min of 1 mg/dL) at 89/74/7974  6:92 AM  Serum Sodium: 147 mmol/L (Using max of 137 mmol/L) at 03/26/2024  3:07 AM  Total Bilirubin: 1.3 mg/dL at 89/74/7974  6:92 AM  Serum Albumin: 1.8 g/dL at 89/74/7974  6:92 AM  INR(ratio): 1.19 at 03/25/2024 10:27 PM  Age at listing (hypothetical): 78 years  Sex: Female at 03/26/2024  3:07 AM           Issues Impacting Complexity of Management:  -The patient has the need for intensive monitoring parameter(s) due to high-risk of clinical decline: q4h or more frequent monitoring of hemoglobins to monitor for stability of GI bleeding    Recommendations discussed with the patient's primary team. We will continue to follow along with you.    Subjective:   Patient seen by our service on 03/15/2024 for sequelae of her MASLD cirrhosis, see note for full details. In the interim, patient was extubated and transferred for further management of anticoagulation and hypernatremia. Rapid was called on 10/24 for increased somnolence and worsening respiratory status. Found to be in PEA, ROSC was achieved. Patient needed to be re-intubated. Central line was placed following intubation. CXR showed c/f intra-aterial location, which was confirmed on blood gas. CVC removed, bedside US  was reassuring. Patient transferred to MICU for respiratory and pressor support. Extensive CT imaging done, active extravasation was found to be in gastric lumen likely from small branch of left gastric artery. Etiology thought to be 2/2 to iatrogenic enteric tube placement. Hgb dropping and GI consulted for EGD.         Objective:   Temp:  [34.4 ??C (93.9 ??F)] 34.4 ??  C (93.9 ??F)  Pulse:  [55-142] 65  SpO2 Pulse:  [57-142] 63  Resp:  [0-113] 11  BP: (132-203)/(52-190) 150/81  A BP-1: (79-137)/(46-76) 128/66  FiO2 (%):  [35 %-70 %] 35 %  SpO2:  [96 %-100 %] 100 %    Gen: Intubated and sedated  Abdomen: Soft, NTND, no rebound/guarding, no hepatosplenomegaly  Extremities: No edema in the BLEs    Pertinent Labs & Studies:  -I have reviewed the patient's labs from 03/26/24 which show down-trending Hgb       CTA AP:  Impression      Active extravasation in the gastric fundus, possibly secondary to  traumatic enteric tube placement which appears to briefly be intramural prior to terminating in the gastric lumen. Origin is favored to be a small branch arising from the left gastric artery.      No other significant interval change compared to prior exam from 1 day prior.      Please see separately dictated chest CT for findings above the diaphragm.      Similar masslike thickening of the lower sigmoid colon concerning for malignancy.     CTA neck:  Impression   Irregularity of the right common carotid with contrast visualized extending from the right carotid artery to the right internal jugular vein; consistent with fistula formation in the setting of recent line placement. Recommend vascular consultation if not already performed.

## 2024-03-26 NOTE — Plan of Care (Signed)
 Problem: Mechanical Ventilation Invasive  Goal: Effective Communication  Outcome: Ongoing - Unchanged  Goal: Optimal Device Function  Outcome: Ongoing - Unchanged  Intervention: Optimize Device Care and Function  Recent Flowsheet Documentation  Taken 03/26/2024 0243 by Jolaine Rosaline CROME, RRT  Airway/Ventilation Management: humidification applied  Taken 03/25/2024 2245 by Jolaine Rosaline CROME, RRT  Airway/Ventilation Management: humidification applied  Oral Care: mouth swabbed  Goal: Mechanical Ventilation Liberation  Outcome: Ongoing - Unchanged  Goal: Absence of Device-Related Skin and Tissue Injury  Outcome: Ongoing - Unchanged  Goal: Absence of Ventilator-Induced Lung Injury  Outcome: Ongoing - Unchanged  Intervention: Prevent Ventilator-Associated Pneumonia  Recent Flowsheet Documentation  Taken 03/26/2024 0243 by Jolaine Rosaline CROME, RRT  Head of Bed [facility]) Positioning: HOB at 30-45 degrees  Taken 03/25/2024 2245 by Jolaine Rosaline CROME, RRT  Head of Bed [facility]) Positioning: HOB at 30-45 degrees  Oral Care: mouth swabbed

## 2024-03-26 NOTE — Plan of Care (Signed)
 Shift Summary  Airway was repositioned and secured, with ETT placement and site condition assessed as dry and secure.  Cuff pressure was checked and maintained within appropriate range.  Ventilator settings and vital signs were stable throughout the shift, with no changes in mechanical ventilation mode.  Blood cultures were collected and showed no growth at 24 hours.  Airway patency and pulmonary hygiene were actively managed, and overall status remained stable with no device-related complications documented.    Optimal Device Function: Airway safety measures were maintained throughout the shift, including the presence of a manual resuscitator/mask, oxygen flowmeter, suction, and high-efficiency antimicrobial filters at the bedside, with the endotracheal tube remaining secure and site condition dry.    Mechanical Ventilation Liberation: Mechanical ventilation settings and readings remained stable during the shift, with airway patency maintained and pulmonary hygiene promoted; no changes in ventilator mode or liberation attempts were documented.    Absence of Device-Related Skin and Tissue Injury: Endotracheal tube site condition was documented as dry and secure, with no evidence of device-related skin or tissue injury noted during the shift.    Absence of Ventilator-Induced Lung Injury: Ventilator parameters, including tidal volume per ideal body weight and PEEP, remained within recommended ranges, and no documentation of lung injury was present during the shift.

## 2024-03-26 NOTE — Consults (Signed)
 Vascular Surgery Inpatient Consult Note    Date of Service: 03/26/2024    Requesting Attending: Rosalita Lynwood Abu, MD  Requesting Service: Medical ICU (MDI)    Consulting Attending: Dr. 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 Rosalita Lynwood Abu, MD of Medical ICU (MDI).    ASSESSMENT/PLAN:  78 y.o. 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  In addition she has GI bleed noted on CTA and NG with >500cc coffee-ground output since arrival.  No hematoma noted on right neck exam.  Patient moving all 4 extremities to pain when sedation reduced.      Patient will require surgical management of fistula in the next 24 to 48 hours.  Please prep 2 units PRBC for OR  Please obtain carotid duplex today.  Continue sepsis treatment with broad-spectrum antibiotics.  Will continue to monitor blood cultures and urine culture  Management of GI bleed necessary before proceeding to the OR- by VIR versus GI      This consult was discussed with Dr. Ave, the attending physician. Please contact the vascular consult pager with any questions or concerns.     HPI:  [name] is a 78 y.o. 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  Outside hospital note states that right central line was placed this afternoon and used for pressors.  On chest x-ray was noted to be in arterial circulation.  The catheter was removed and pressure was held for 15 minutes with quick clot.  Hemostasis was achieved and there was no hematoma noted at that time. Patient then transferred to [facility]. 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  In addition she has GI bleed noted on CTA and NG with >500cc coffee-ground output since arrival.  No hematoma noted on right neck exam.  Patient moving all 4 extremities to pain when sedation reduced.     REVIEW OF SYSTEMS:  A further review of ten systems was performed and negative except as stated in HPI.    MEDICAL HISTORY:  Past Medical History[1]    SURGICAL HISTORY:  Past Surgical History[2]    ALLERGIES:  Allergies as of 03/08/2024 - Reviewed 03/08/2024   Allergen Reaction Noted    Sulfa (sulfonamide antibiotics) Anaphylaxis 06/03/2017    Sulfur  Anaphylaxis 02/28/2015    Aspirin  07/24/2017     MEDICATIONS:  Prior to Admission medications   Medication Sig Start Date End Date Taking? Authorizing Provider   anastrozole  (ARIMIDEX ) 1 mg tablet Take 1 tablet (1 mg total) by mouth daily. 09/17/23  Yes Myer Reyes BIRCH, MD   carvedilol  (COREG ) 3.125 MG tablet Take 1 tablet (3.125 mg total) by mouth two (2) times a day.  08/06/23 08/05/24 Yes Alyse Slater Pao, MD   folic acid  (FOLVITE ) 1 MG tablet Take 1 tablet (1 mg total) by mouth daily.   Yes [provider]   furosemide (LASIX) 40 MG tablet Take 1 tablet (40 mg total) by mouth daily as needed. 03/08/24 03/08/25 Yes Max, Marshia Carbo, CPP   magnesium  oxide (MAG-OX) 400 mg (241.3 mg elemental magnesium ) tablet Take 1 tablet (400 mg total) by mouth two (2) times a day. 05/29/23 05/28/24 Yes Derenda Rockers, MD   potassium chloride  20 MEQ ER tablet Take 1 tablet (20 mEq total) by mouth two (2) times a day. 03/04/24  Yes Houston-Dixon, Almarie SAUNDERS, MD   spironolactone (ALDACTONE) 25 MG tablet Take 1 tablet (25 mg total) by mouth daily. 03/08/24  Yes Max, Marshia Carbo, CPP   thiamine  (B-1) 100 MG tablet Take 1 tablet (100 mg total) by mouth daily.   Yes [provider]   vitamin E-268 mg, 400 UNIT, 268 mg (400 UNIT) capsule Take 800 mg by mouth in the morning. 2 tabs.   Yes [provider]   acetaminophen  (TYLENOL ) 325 MG tablet Take 2 tablets (650 mg total) by mouth every six (6) hours as needed for pain. 06/05/17   Katherleen Fairy Bring, MD   [Paused] alendronate  (FOSAMAX ) 70 MG tablet Take 1 tablet (70 mg total) by mouth every seven (7) days.  Patient not taking: Reported on 03/07/2024  Wait to take this until your doctor or other care provider tells you to start again. 09/17/23   Myer Reyes BIRCH, MD   calcium carbonate (OS-CAL) 1,250 mg (500 mg elem calcium) tablet Take 1 tablet (500 mg elem calcium total) by mouth in the morning.  Patient not taking: Reported on 03/07/2024    [provider]   cyanocobalamin 1000 MCG tablet Take 1 tablet (1,000 mcg total) by mouth daily.    [provider]   [Paused] nystatin  (MYCOSTATIN ) 100,000 unit/gram powder Apply to affected area 3 times daily  Wait to take this until your doctor or other care provider tells you to start again. 03/05/23 03/07/24  Quin [name], FNP   empagliflozin (JARDIANCE) 10 mg tablet Take 1 tablet (10 mg total) by mouth daily. For benefit inquiry only. Please send Epic chat message to [facility] with coverage information, then discontinue Rx. 03/02/24 03/02/24  Houston-Dixon, Almarie SAUNDERS, MD     SOCIAL HISTORY   reports that she has never smoked. She has been exposed to tobacco smoke. She has never used smokeless tobacco. She reports that she does not currently use alcohol. She reports that she does not use drugs.  Tobacco Use History[3]    FAMILY HISTORY:  Family History[4]    PHYSICAL EXAMINATION:   VITALS: BP 150/81  - Pulse 67  - Temp (!) 34.4 ??C (93.9 ??F) (Rectal) Comment: rectal - Resp 16  - Ht 162.6 cm (5' 4.02)  - Wt 92 kg (202 lb 13.2 oz)  - SpO2 100%  - BMI 34.80 kg/m??       I/O this shift:  In: 322.5 [I.V.:322.5]  Out: 35 [Urine:35]  GENERAL: Intubated, sedated  CARDIOVASCULAR: Regular Rate and Rhythm  RESPIRATORY: Normal work of breathing on ventilator   Neck: Mild ecchymosis mostly to right neck, no palpable hematoma   ABDOMEN: Soft, nontender, nondistended.    EXTREMITIES: Warm, well-perfused  SKIN: No lesions, ulcerations, breakdowns noted on exam  NEURO: Sedated      Ancillary Data:  All lab results last 24  hours:    Recent Results (from the past 24 hours)   Basic Metabolic Panel    Collection Time: 03/25/24  7:17 AM   Result Value Ref Range    Sodium 153 (H) 135 - 145 mmol/L    Potassium 3.5 3.4 - 4.8 mmol/L    Chloride 108 (H) 98 - 107 mmol/L    CO2 34.5 (H) 20.0 - 31.0 mmol/L    Anion Gap 11 5 - 14 mmol/L    BUN 8 (L) 9 - 23 mg/dL    Creatinine 9.51 (L) 0.55 - 1.02 mg/dL    BUN/Creatinine Ratio 17     eGFR CKD-EPI (2021) Female >90 >=60 mL/min/1.62m2    Glucose 107 70 - 179 mg/dL    Calcium 9.3 8.7 - 89.5 mg/dL   Phosphorus Level    Collection Time: 03/25/24  7:17 AM   Result Value Ref Range    Phosphorus 2.3 (L) 2.4 - 5.1 mg/dL   Magnesium  Level    Collection Time: 03/25/24  7:17 AM   Result Value Ref Range    Magnesium  2.4 1.6 - 2.6 mg/dL   CBC w/ Differential    Collection Time: 03/25/24  7:17 AM   Result Value Ref Range    WBC 3.0 (L) 3.6 - 11.2 10*9/L    RBC 3.67 (L) 3.95 - 5.13 10*12/L    HGB 12.3 11.3 - 14.9 g/dL    HCT 63.1 65.9 - 55.9 %    MCV 100.2 (H) 77.6 - 95.7 fL    MCH 33.5 (H) 25.9 - 32.4 pg    MCHC 33.5 32.0 - 36.0 g/dL    RDW 80.4 (H) 87.7 - 15.2 %    MPV 8.2 6.8 - 10.7 fL    Platelet 96 (L) 150 - 450 10*9/L    nRBC 0 <=4 /100 WBCs    Neutrophils % 62.3 %    Lymphocytes % 25.4 %    Monocytes % 7.1 %    Eosinophils % 4.8 %    Basophils % 0.4 %    Absolute Neutrophils 1.9 1.8 - 7.8 10*9/L    Absolute Lymphocytes 0.8 (L) 1.1 - 3.6 10*9/L    Absolute Monocytes 0.2 (L) 0.3 - 0.8 10*9/L    Absolute Eosinophils 0.1 0.0 - 0.5 10*9/L    Absolute Basophils 0.0 0.0 - 0.1 10*9/L    Anisocytosis Moderate (A) Not Present   Osmolality, Serum    Collection Time: 03/25/24  7:17 AM   Result Value Ref Range    Osmolality Meas 312 (H) 275 - 295 mOsm/kg   Pro-BNP    Collection Time: 03/25/24  7:17 AM   Result Value Ref Range    PRO-BNP 467.0 (H) <=300.0 pg/mL   ECG 12 Lead    Collection Time: 03/25/24 12:07 PM   Result Value Ref Range    EKG Systolic BP  mmHg    EKG Diastolic BP  mmHg    EKG Ventricular Rate 58 BPM    EKG Atrial Rate  BPM    EKG P-R Interval  ms    EKG QRS Duration 106 ms    EKG Q-T Interval 424 ms    EKG QTC Calculation 416 ms    EKG Calculated P Axis  degrees    EKG Calculated R Axis -7 degrees    EKG Calculated T Axis 129 degrees    QTC Fredericia 419 ms   POCT Glucose    Collection Time: 03/25/24 12:23 PM   Result Value  Ref Range    Glucose, POC 141 70 - 179 mg/dL   Blood Gas Critical Care Panel, Venous    Collection Time: 03/25/24 12:36 PM   Result Value Ref Range    Specimen Source Venous     FIO2 Venous Not Specified     pH, Venous 7.26 (L) 7.32 - 7.43    pCO2, Ven 79 (HH) 40 - 60 mm Hg    pO2, Ven 30 (L) 35 - 40 mm Hg    HCO3, Ven 29 (H) 22 - 27 mmol/L    Base Excess, Ven 8.2 (H) -2.0 - 2.0    O2 Saturation, Venous 47.2 40.0 - 85.0 %    Sodium Whole Blood 149 (H) 135 - 145 mmol/L    Potassium, Bld 4.2 3.4 - 4.6 mmol/L    Calcium, Ionized Venous 5.10 4.40 - 5.40 mg/dL    Glucose Whole Blood 133 Undefined mg/dL    Lactate, Venous 1.8 0.5 - 1.8 mmol/L    Hgb, blood gas 12.30 12.00 - 16.00 g/dL   Blood Gas Critical Care Panel, Venous    Collection Time: 03/25/24  2:00 PM   Result Value Ref Range    Specimen Source Mixed Venous     FIO2 Venous Not Specified     pH, Venous 7.39 7.32 - 7.43    pCO2, Ven 56 40 - 60 mm Hg    pO2, Ven 170 (H) 35 - 40 mm Hg    HCO3, Ven 31 (H) 22 - 27 mmol/L    Base Excess, Ven 8.4 (H) -2.0 - 2.0    O2 Saturation, Venous 99.5 (H) 40.0 - 85.0 %    Sodium Whole Blood 148 (H) 135 - 145 mmol/L    Potassium, Bld 3.8 3.4 - 4.6 mmol/L    Calcium, Ionized Venous 4.98 4.40 - 5.40 mg/dL    Glucose Whole Blood 147 Undefined mg/dL    Lactate, Venous 3.4 (H) 0.5 - 1.8 mmol/L    Hgb, blood gas 11.90 (L) 12.00 - 16.00 g/dL   Ammonia    Collection Time: 03/25/24  2:32 PM   Result Value Ref Range    Ammonia 160 (H) 11 - 32 umol/L   Urinalysis with Microscopy with Culture Reflex    Collection Time: 03/25/24  2:33 PM   Result Value Ref Range    Color, UA Yellow     Clarity, UA Turbid     Specific Gravity, UA 1.011 1.003 - 1.030    pH, UA 8.0 5.0 - 9.0    Leukocyte Esterase, UA Negative Negative    Nitrite, UA Negative Negative    Protein, UA 70 mg/dL (A) Negative    Glucose, UA 500 mg/dL (A) Negative    Ketones, UA Negative Negative    Urobilinogen, UA <2.0 mg/dL <7.9 mg/dL    Bilirubin, UA Negative Negative    Blood, UA Trace (A) Negative    RBC, UA 8 (H) <=4 /HPF    WBC, UA 25 (H) 0 - 5 /HPF    Squam Epithel, UA 2 0 - 5 /HPF    Bacteria, UA Rare (A) None Seen /HPF    Hyaline Casts, UA 47 (H) 0 - 1 /LPF    Mucus, UA Few (A) None Seen /HPF   Blood Gas, Arterial Specify Site: Arterial; FIO2 Arterial: Other    Collection Time: 03/25/24  5:46 PM   Result Value Ref Range    Specimen Source Arterial     FIO2 Arterial Other  pH, Arterial 7.49 (H) 7.35 - 7.45    pO2, Arterial 147.0 (H) 80.0 - 110.0 mm Hg    pCO2, Arterial 45.1 (H) 35.0 - 45.0 mm Hg    HCO3 (Bicarbonate), Arterial 34 (H) 22 - 27 mmol/L    Base Excess, Arterial 11.1 (H) -2.0 - 2.0    O2 Sat, Arterial 99.6 94.0 - 100.0 %   POCT Glucose    Collection Time: 03/25/24  8:54 PM   Result Value Ref Range    Glucose, POC 147 70 - 179 mg/dL   Blood Gas Critical Care Panel, Arterial    Collection Time: 03/25/24 10:27 PM   Result Value Ref Range    Specimen Source Arterial     FIO2 Arterial Not Specified     pH, Arterial 7.63 (H) 7.35 - 7.45    pCO2, Arterial 26.9 (L) 35.0 - 45.0 mm Hg    pO2, Arterial 144.0 (H) 80.0 - 110.0 mm Hg    HCO3 (Bicarbonate), Arterial 29 (H) 22 - 27 mmol/L    Base Excess, Arterial 6.6 (H) -2.0 - 2.0    O2 Sat, Arterial >100.0 (H) 94.0 - 100.0 %    Sodium Whole Blood 144 135 - 145 mmol/L    Potassium, Bld 3.1 (L) 3.4 - 4.6 mmol/L    Calcium, Ionized Arterial 4.38 (L) 4.40 - 5.40 mg/dL    Glucose Whole Blood 137 70 - 179 mg/dL    Lactate, Arterial 2.7 (H) <1.3 mmol/L    Hgb, blood gas 11.20 (L) 12.00 - 16.00 g/dL    Carboxyhemoglobin 2.3 (H) <1.2 %    Methemoglobin <1.0 <1.5 %    Oxyhemoglobin 97.2 94.0 - 100.0 %    Chloride, Whole Blood 116 (H) 98 - 107 mmol/L   aPTT    Collection Time: 03/25/24 10:27 PM   Result Value Ref Range    APTT 41.0 (H) 24.8 - 38.4 sec    Heparin Correlation 0.2    PT-INR    Collection Time: 03/25/24 10:27 PM   Result Value Ref Range    PT 13.6 (H) 9.9 - 12.6 sec    INR 1.19    Fibrinogen    Collection Time: 03/25/24 10:27 PM   Result Value Ref Range    Fibrinogen 134 (L) 175 - 500 mg/dL   CBC    Collection Time: 03/25/24 10:27 PM   Result Value Ref Range    WBC 9.7 3.6 - 11.2 10*9/L    RBC 3.70 (L) 3.95 - 5.13 10*12/L    HGB 12.3 11.3 - 14.9 g/dL    HCT 63.4 65.9 - 55.9 %    MCV 98.7 (H) 77.6 - 95.7 fL    MCH 33.1 (H) 25.9 - 32.4 pg    MCHC 33.6 32.0 - 36.0 g/dL    RDW 81.4 (H) 87.7 - 15.2 %    MPV 8.6 6.8 - 10.7 fL    Platelet 134 (L) 150 - 450 10*9/L   Comprehensive Metabolic Panel    Collection Time: 03/25/24 10:28 PM   Result Value Ref Range    Sodium 148 (H) 135 - 145 mmol/L    Potassium      Chloride 106 98 - 107 mmol/L    CO2 29.0 20.0 - 31.0 mmol/L    Anion Gap 13 5 - 14 mmol/L    BUN 9 9 - 23 mg/dL    Creatinine 9.42 9.44 - 1.02 mg/dL    BUN/Creatinine Ratio 16     eGFR  CKD-EPI (2021) Female >90 >=60 mL/min/1.3m2    Glucose 139 70 - 179 mg/dL    Calcium 9.3 8.7 - 89.5 mg/dL    Albumin 2.4 (L) 3.4 - 5.0 g/dL    Total Protein 5.4 (L) 5.7 - 8.2 g/dL    Total Bilirubin 1.5 (H) 0.3 - 1.2 mg/dL    AST      ALT 24 10 - 49 U/L    Alkaline Phosphatase 99 46 - 116 U/L   Type and Screen with Confirmation ABORh    Collection Time: 03/25/24 10:28 PM   Result Value Ref Range    ABO Grouping O POS     Antibody Screen NEG    Potassium Level    Collection Time: 03/25/24 11:42 PM   Result Value Ref Range    Potassium 3.4 3.4 - 4.8 mmol/L   AST    Collection Time: 03/25/24 11:42 PM   Result Value Ref Range    AST 42 (H) <=34 U/L   MRSA Screen    Collection Time: 03/25/24 11:42 PM    Specimen: Nose (nasal passage); Nares   Result Value Ref Range    MRSA PCR Not Detected Not Detected   Carboxyhemoglobin/POC    Collection Time: 03/25/24 11:46 PM   Result Value Ref Range    Carboxyhemoglobin 1.3 (H) <1.2 %   GLUCOSE-BG/POC    Collection Time: 03/25/24 11:46 PM   Result Value Ref Range    Glucose Whole Blood 121 70 - 179 mg/dL   POCT Arterial Blood Gas    Collection Time: 03/25/24 11:46 PM   Result Value Ref Range    pH, Arterial 7.55 (H) 7.35 - 7.45    pCO2, Arterial 29.1 (L) 35.0 - 45.0 mm[Hg]    pO2, Arterial 134 (H) 80 - 110 mm[Hg]    HCO3 (Bicarbonate), Arterial 25.2 22.0 - 27.0 mmol/L    Base Excess, Arterial 2.7 (H) -2.0 - 2.0 mmol/L    O2 Sat, Arterial >100.0 (H) 94.0 - 100.0 %    Specimen Type, Arterial Arterial     FIO2 50 %   HEMOGLOBIN BG/POC    Collection Time: 03/25/24 11:46 PM   Result Value Ref Range    Hemoglobin 9.6 (L) 12.0 - 16.0 g/dL   POCT Ionized Calcium    Collection Time: 03/25/24 11:46 PM   Result Value Ref Range    Ionized Calcium 3.9 (L) 4.4 - 5.4 mg/dL   POTASSIUM-BG/POC    Collection Time: 03/25/24 11:46 PM   Result Value Ref Range    Potassium, Bld 2.4 (LL) 3.4 - 4.6 mmol/L   POCT Lactic Acid (Lactate), Arterial    Collection Time: 03/25/24 11:46 PM   Result Value Ref Range    Lactate, Arterial 2.1 (H) <1.3 mmol/L   METHEMOGLOBIN/POC    Collection Time: 03/25/24 11:46 PM   Result Value Ref Range    Methemoglobin <1.0 <1.5 %   SODIUM-BG/POC    Collection Time: 03/25/24 11:46 PM   Result Value Ref Range    Sodium Whole Blood 147 (H) 135 - 145 mmol/L   POCT Arterial Oxyhemoglobin    Collection Time: 03/25/24 11:46 PM   Result Value Ref Range    Oxyhemoglobin 98.1 94.0 - 100.0 %   Phosphorus Level    Collection Time: 03/26/24  3:07 AM   Result Value Ref Range    Phosphorus 2.0 (L) 2.4 - 5.1 mg/dL   Magnesium  Level    Collection Time: 03/26/24  3:07 AM  Result Value Ref Range    Magnesium  2.0 1.6 - 2.6 mg/dL   Basic Metabolic Panel    Collection Time: 03/26/24  3:07 AM   Result Value Ref Range    Sodium 147 (H) 135 - 145 mmol/L    Potassium 3.5 3.4 - 4.8 mmol/L    Chloride 108 (H) 98 - 107 mmol/L    CO2 30.0 20.0 - 31.0 mmol/L    Anion Gap 9 5 - 14 mmol/L    BUN 9 9 - 23 mg/dL    Creatinine 9.38 9.44 - 1.02 mg/dL    BUN/Creatinine Ratio 15     eGFR CKD-EPI (2021) Female >90 >=60 mL/min/1.42m2    Glucose 141 70 - 179 mg/dL    Calcium 8.3 (L) 8.7 - 10.4 mg/dL   Blood Gas Critical Care Panel, Arterial    Collection Time: 03/26/24  3:07 AM   Result Value Ref Range    Specimen Source Arterial     FIO2 Arterial Not Specified     pH, Arterial 7.42 7.35 - 7.45    pCO2, Arterial 46.5 (H) 35.0 - 45.0 mm Hg    pO2, Arterial 130.0 (H) 80.0 - 110.0 mm Hg    HCO3 (Bicarbonate), Arterial 30 (H) 22 - 27 mmol/L    Base Excess, Arterial 5.6 (H) -2.0 - 2.0    O2 Sat, Arterial 99.6 94.0 - 100.0 %    Sodium Whole Blood 144 135 - 145 mmol/L    Potassium, Bld 3.2 (L) 3.4 - 4.6 mmol/L    Calcium, Ionized Arterial 4.79 4.40 - 5.40 mg/dL    Glucose Whole Blood 135 70 - 179 mg/dL    Lactate, Arterial 1.8 (H) <1.3 mmol/L    Hgb, blood gas 9.90 (L) 12.00 - 16.00 g/dL    Carboxyhemoglobin 2.0 (H) <1.2 %    Methemoglobin <1.0 <1.5 %    Oxyhemoglobin 96.9 94.0 - 100.0 %    Chloride, Whole Blood 110 (H) 98 - 107 mmol/L   Hepatic Function Panel    Collection Time: 03/26/24  3:07 AM   Result Value Ref Range    Albumin 1.8 (L) 3.4 - 5.0 g/dL    Total Protein 4.2 (L) 5.7 - 8.2 g/dL    Total Bilirubin 1.3 (H) 0.3 - 1.2 mg/dL Bilirubin, Direct 9.29 (H) 0.00 - 0.30 mg/dL    AST 33 <=65 U/L    ALT 18 10 - 49 U/L    Alkaline Phosphatase 83 46 - 116 U/L   CBC w/ Differential    Collection Time: 03/26/24  3:07 AM   Result Value Ref Range    WBC 8.5 3.6 - 11.2 10*9/L    RBC 2.98 (L) 3.95 - 5.13 10*12/L    HGB 10.0 (L) 11.3 - 14.9 g/dL    HCT 70.3 (L) 65.9 - 44.0 %    MCV 99.4 (H) 77.6 - 95.7 fL    MCH 33.6 (H) 25.9 - 32.4 pg    MCHC 33.8 32.0 - 36.0 g/dL    RDW 81.6 (H) 87.7 - 15.2 %    MPV 8.1 6.8 - 10.7 fL    Platelet 101 (L) 150 - 450 10*9/L    Neutrophils % 74.9 %    Lymphocytes % 10.4 %    Monocytes % 11.7 %    Eosinophils % 2.8 %    Basophils % 0.2 %    Absolute Neutrophils 6.3 1.8 - 7.8 10*9/L    Absolute Lymphocytes 0.9 (L) 1.1 - 3.6 10*9/L  Absolute Monocytes 1.0 (H) 0.3 - 0.8 10*9/L    Absolute Eosinophils 0.2 0.0 - 0.5 10*9/L    Absolute Basophils 0.0 0.0 - 0.1 10*9/L    Anisocytosis Slight (A) Not Present   Prepare Plasma    Collection Time: 03/26/24  4:20 AM   Result Value Ref Range    PRODUCT CODE [id number]     Product ID FFP     Status Ready for issue     Unit # [id number]     Unit Blood Type O Pos     ISBT Number 5100    Prepare RBC    Collection Time: 03/26/24  4:44 AM   Result Value Ref Range    PRODUCT CODE [id number]     Product ID Red Blood Cells     Specimen Expiration Date [id number]     Status Ready for issue     Unit # [id number]     Unit Blood Type O Pos     ISBT Number 5100     Crossmatch Compatible     PRODUCT CODE [id number]     Product ID Red Blood Cells     Specimen Expiration Date [id number]     Status Transfused     Unit # [id number]     Unit Blood Type O Pos     ISBT Number 5100     Crossmatch Compatible        Imaging: CTA Abdomen Pelvis Gi Bleed  Result Date: 03/26/2024  EXAM: CTA Abdomen and Pelvis WO and With contrast (GI Bleed protocol) DATE: 03/26/2024 3:10 AM ACCESSION: [id number] UN DICTATED: 03/26/2024 3:32 AM INTERPRETATION LOCATION: MAIN CAMPUS CLINICAL INDICATION: c/f active GI bleed; hx of cirrhosis with varices  COMPARISON: Additional same-day studies, CT abdomen pelvis 03/25/2024 TECHNIQUE: A spiral CT scan was obtained without IV contrast from the lung basesto the pubic symphysis. Images were reconstructed in the axial plane. Next, a spiral CTA scan was obtained with IV contrast from the lung bases to the pubic symphysis in both arterial and delayed venous phases. Images were reconstructed in the axial plane. Multiplanar reformatted and MIP images were provided for further evaluation of the vessels. For selected cases, 3D volume rendered images are also provided. VASCULAR FINDINGS: ABDOMINAL AORTA: Patent. Normal caliber. Atherosclerotic calcifications of the aorta and its branches. CELIAC ARTERY: Patent.  SMA: Patent.  RENAL ARTERIES: Patent.  IMA: Patent.  RIGHT COMMON ILIAC ARTERY: Patent. RIGHT EXTERNAL ILIAC ARTERY: Patent. RIGHT COMMON FEMORAL/PROFUNDA FEMORAL/SFA: Patent.  LEFT COMMON ILIAC ARTERY: Patent. LEFT EXTERNAL ILIAC ARTERY: Patent. LEFT COMMON FEMORAL/PROFUNDA FEMORAL/SFA: Patent. PORTAL/MESENTERIC VEINS: Patent.   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 SYSTEMIC VEINS: Patent.  NON-VASCULAR FINDINGS: LINES AND TUBES: Partially imaged enteric tube with possibly briefly courses intramurally before terminating in the gastric lumen. Partially imaged central venous catheter terminating in the superior cavoatrial junction. Bladder decompressed with Foley catheter. LOWER THORAX: Reported separately. HEPATOBILIARY: Nodular contour with left hepatic lobe hypertrophy. Right hepatic lobe calcification. The gallbladder is surgically absent. No biliary dilatation.  SPLEEN: Unremarkable. PANCREAS: Unremarkable. ADRENALS: Unremarkable. KIDNEYS/URETERS: Multiple subcentimeter hypoattenuating lesions, favor cysts. Excreted contrast in the collecting system limits evaluation for nephrolithiasis. No hydronephrosis. BLADDER: Decompressed with Foley catheter. PELVIC/REPRODUCTIVE ORGANS: Unremarkable. GI TRACT: Evidence of active extravasation in the gastric fundus (3:108, 8:114), possibly secondary to traumatic enteric tube placement which appears to briefly be intramural prior to terminating in the gastric lumen (3:115). Active extravasation likely arising from branch of left gastric artery. No dilated  or thick walled loops of bowel. Duodenal diverticulum. Colon diverticulosis. Similar masslike thickening of the lower sigmoid colon with luminal narrowing (3:323). Normal appendix. PERITONEUM/RETROPERITONEUM AND MESENTERY: No free air or fluid. LYMPH NODES: No enlarged lymph nodes. BONES AND SOFT TISSUES: Multilevel degenerative change of the spine 9 chronic compression deformity at T12. Similar nodular soft tissue with adjacent surgical clips in the left breast. Scattered soft tissue nodule in subcutaneous area and anterior dominant wall, likely injection related. Rectus diastases. Small fat-containing umbilical hernia. Remote rib fractures.     Active extravasation in the gastric fundus, possibly secondary to  traumatic enteric tube placement which appears to briefly be intramural prior to terminating in the gastric lumen. Origin is favored to be a small branch arising from the left gastric artery. No other significant interval change compared to prior exam from 1 day prior. Please see separately dictated chest CT for findings above the diaphragm. Similar masslike thickening of the lower sigmoid colon concerning for malignancy. ++++++++++++++++++++ The findings of this study were discussed via secure Epic chat with DR. Dale Ozell Ladora Arnell by Dr. Evalene Daring on 03/26/2024 3:50 AM. -----------------------------------------------    CTA Chest W Contrast  Result Date: 03/26/2024  EXAM: CTA CHEST W CONTRAST ACCESSION: [id number] UN REPORT DATE: 03/26/2024 3:45 AM CLINICAL INDICATION: eval for arterial injury after arterial CVC and eval for infectious cause TECHNIQUE: Spiral CTA scan of the chest was obtained with IV contrast from the thoracic inlet through the hemidiaphragms. Images were reconstructed in the axial, coronal, and sagittal planes.  Multiplanar reformatted and MIP images are provided. COMPARISON: CT chest 02/29/24. FINDINGS: AORTA: Thoracic aorta is normal in caliber with no acute injury. There is conventional branching of the great vessels. No focal vascular injury is is appreciated within the visualized vessels. HEART/VASCULATURE: Cardiac chambers are normal in size. No pericardial effusion. Main pulmonary artery is normal in size. LUNGS/AIRWAYS/PLEURA: Trachea and large airways are patent. Bilateral centrilobular groundglass opacities on a background of smooth interlobular septal thickening. No focal consolidation is visualized. Large right and small left pleural effusions with adjacent atelectasis. MEDIASTINUM/THORACIC INLET: No enlarged thoracic lymph nodes. No mediastinal hematoma. No thyroid abnormality. UPPER ABDOMEN: Reported separately . CHEST WALL/BONES: No significant chest wall hematoma. Sequela of prior T12 compression deformity with retropulsion of the posterior vertebral cortex into the ventral spinal canal. Chronic appearing T3 compression deformity. No acute displaced rib, clavicle or sternal fracture. DEVICES: Right internal jugular catheter tip is at the level of the superior cavoatrial junction.     There is no focal vascular injury visualized within the CT chest. Otherwise, bilateral centrilobular groundglass opacities on a background of smooth interlobular septal thickening, compatible with pulmonary interstitial edema. Large right and small left pleural effusions.     CTA Neck W Contrast  Result Date: 03/26/2024  EXAM: Computed tomographic angiography, neck, with contrast material, including noncontrast images, if performed, and image postprocessing on a stand alone workstation. DATE: 03/26/2024 3:10 AM ACCESSION: [id number] UN DICTATED: 03/26/2024 3:16 AM INTERPRETATION LOCATION: [facility] CLINICAL INDICATION: 78 years old Female with eval for carotid injury  COMPARISON: None TECHNIQUE: Contiguous axial CT angiogram of the neck during contrast bolus infusion were acquired.  Multiplanar reformatted and MIP images were provided. For selected cases, 3D volume rendered images are also provided. FINDINGS: Focal irregularity of the right cervical carotid at the level C4-C5 with contrast visualized extending from the right carotid artery to the right internal jugular vein (1:174-166). No evidence of adjacent pseudoaneurysm formation. Bilateral carotid bulb  calcifications. Bilateral carotid siphon calcifications. No evidence of dissection or aneurysm in the left carotid or bilateral vertebral arteries. No hemodynamically significant arterial stenosis. Left IJ approach central venous catheter with tip terminating below the field-of-view. The soft tissues are unremarkable without lymphadenopathy, mass or abscess. Multilevel degenerative changes of the visualized spine. The airway is patent and midline with endotracheal tube terminating above the carina. Large right pleural effusion, small left pleural effusion. Please refer to same-day CT chest for findings below the thoracic inlet. In this report, if stenosis of the internal carotid artery is reported, it is calculated based on the NASCET method: (1 - (minimal residual diameter/normal diameter of distal ICA)).     Irregularity of the right cervical carotid with contrast visualized extending from the right carotid artery to the right internal jugular vein. 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 Recommend vascular consultation if not already performed. ++++++++++++++++++++ The findings of this study were discussed via telephone with DR. Dale Ladora Peal by Dr. Star Blanch on 03/26/2024 3:26 AM. -----------------------------------------------    CT Head Wo Contrast  Result Date: 03/26/2024  EXAM: Computed tomography, head or brain without contrast material. ACCESSION: [id number] UN CLINICAL INDICATION: 78 years old Female with suspected stroke  COMPARISON: CT head 03/25/2024. TECHNIQUE: Axial CT images of the head from skull base to vertex without contrast. FINDINGS: There is no midline shift. No mass lesion. There is no evidence of acute infarct. There are scattered and confluent hypodense foci within the periventricular and deep white matter. These are nonspecific but are commonly associated with small vessel ischemic changes. Cerebral volume loss with ex vacuo dilation of the CSF-containing spaces. The sinuses are pneumatized. No intracranial hemorrhage or skull fractures.     No acute intracranial abnormality.     XR Abdomen 1 View  Result Date: 03/26/2024  EXAM: XR ABDOMEN 1 VIEW ACCESSION: [id number] UN REPORT DATE: 03/26/2024 1:24 AM CLINICAL INDICATION: 78 years old with NGT (CATHETER VASCULAR FIT & ADJ)  COMPARISON: None TECHNIQUE: Supine view of the abdomen, 1 image(s) FINDINGS: Enteric tube with tip and sideport project over the left upper quadrant. Nonobstructive bowel gas pattern of the partially visualized abdomen. Multilevel degenerative changes of the spine. Please refer to concurrent chest for findings with the diaphragm.     Enteric tube with tip and sideport projecting over the left upper quadrant.     XR Chest Portable  Result Date: 03/26/2024  EXAM: XR CHEST PORTABLE ACCESSION: [id number] UN REPORT DATE: 03/26/2024 12:34 AM CLINICAL INDICATION: ETT (VENTILATOR/RESPIRATOR DEP STATUS)  TECHNIQUE: Single View AP Chest Radiograph. COMPARISON: 03/25/2024 4:42 p.m. chest radiograph FINDINGS: Interval removal of the right-sided approach central venous catheter. Interval insertion of left IJ approach central venous catheter, crossing midline and tip projecting over the superior cavoatrial junction. Otherwise, endotracheal tube terminating approximate 2.1 cm above the carina. Bilateral interstitial prominence with persistent asymmetric hazy opacification of the right middle and lower lung. Bibasilar atelectasis. Both costophrenic sulci are blunted. No pneumothorax. Cardiac silhouette is unchanged in size.     Interval removal of right sided approach central venous catheter. Interval insertion of left IJ approach central venous catheter with tip projecting over the superior cavoatrial junction. Interstitial pulmonary edema. Small bilateral pleural effusions.     XR Abdomen 1 View  Result Date: 03/25/2024  EXAM: XR ABDOMEN 1 VIEW ACCESSION: [id number] UN REPORT DATE: 03/25/2024 7:13 PM CLINICAL INDICATION: 78 years old with NDT and NGT (CATHETER VASCULAR FIT & ADJ)  COMPARISON: CT dated March 25, 2024 TECHNIQUE: Supine view of the abdomen, 1 image(s) FINDINGS: Esophagogastric tube is present with the tip and sideport overlying the left upper quadrant/stomach. Cholecystectomy. Mild volume stool is seen within the imaged portions of the bowel. No free air. No radiopaque calcifications overlie the abdomen or pelvis. Compensatory thoracolumbar curvature and spondylitic changes.     Esophagogastric tube tip and sideport overlie the left upper quadrant/gastric body.     ECG 12 Lead  Result Date: 03/25/2024  ATRIAL FIBRILLATION WITH SLOW VENTRICULAR RESPONSE INCOMPLETE LEFT BUNDLE BRANCH BLOCK NONSPECIFIC T WAVE ABNORMALITY ABNORMAL ECG WHEN COMPARED WITH ECG OF 07-Mar-2024 10:40, NONSPECIFIC T WAVE ABNORMALITY NOW EVIDENT IN INFERIOR LEADS T WAVE INVERSION MORE EVIDENT IN ANTEROLATERAL LEADS QT HAS SHORTENED    XR Chest Portable  Result Date: 03/25/2024  EXAM: XR CHEST PORTABLE ACCESSION: [id number] UN REPORT DATE: 03/25/2024 4:56 PM CLINICAL INDICATION: LINE CHECK (CATHETER VASCULAR FIT)  TECHNIQUE: Single View AP Chest Radiograph. COMPARISON: XR CHEST PORTABLE 03/25/2024 FINDINGS: New right-sided central venous catheter courses medially to near midline. Endotracheal tube tip 1.7 cm superior to the level of the carina. Prominent pulmonary vasculature with persistent asymmetric hazy opacification of the mid and inferior right hemithorax and bibasilar atelectasis. Both costophrenic sulci are blunted. No pneumothorax. Unchanged cardiomediastinal silhouette.     *  New right-sided centimeters access catheter courses medially to near midline and could be intra-arterial in location. Consider correlation with blood gas to determine if the line is intra-arterial or intravenous in location. *  Pulmonary vascular congestion with moderate bilateral pleural effusions. ++++++++++++++++++++ The findings of this study were discussed via acknowledged epic secure chat with Dr. Wanda Pippin by Dr. Krystal Must on 03/25/2024 5:09 PM. -----------------------------------------------    CT Abdomen Pelvis Wo Contrast  Result Date: 03/25/2024  EXAM: CT ABDOMEN PELVIS WO CONTRAST ACCESSION: [id number] UN REPORT DATE: 03/25/2024 4:36 PM CLINICAL INDICATION: 78 years old with AMS, hemodynamic instability, s/p PEA arrest with ROSC  COMPARISON: CT abdomen pelvis dated 03/11/2024 TECHNIQUE: A spiral CT scan was obtained without IV contrast from the lung bases to the pubic symphysis.  Images were reconstructed in the axial plane. Coronal and sagittal reformatted images were also provided for further evaluation. Evaluation of the solid organs and vasculature is limited in the absence of intravenous contrast. FINDINGS: LOWER CHEST: Moderate right and small left pleural effusion, increased from prior. Bibasilar atelectasis. Dense mitral annular calcifications. Aortic root and coronary calcifications. LIVER: Nodular liver contour with left hepatic lobe hypertrophy. Right hepatic lobe calcification. No focal liver lesion on non-contrast examination. BILIARY: The gallbladder is surgically absent. No intrahepatic biliary ductal dilatation. SPLEEN: Normal in size and contour. PANCREAS: Normal pancreatic contour without signs of inflammation or gross ductal dilatation. ADRENAL GLANDS: Normal appearance of the adrenal glands. KIDNEYS/URETERS: Smooth renal contours. Nonobstructing left renal calculi. No ureteral dilatation or collecting system distention. BLADDER: Underdistended with Foley catheter in place. REPRODUCTIVE ORGANS: Unremarkable. GI TRACT: No findings of bowel obstruction or acute inflammation. Duodenal diverticulum arising from the third portion of the duodenum. Colonic diverticulosis. Grossly similar masslike thickening involving the lower sigmoid colon with luminal narrowing (6:102). Normal appendix with hyperattenuating material throughout, likely ingested contents. PERITONEUM/RETROPERITONEUM AND MESENTERY: No free air. No ascites. No fluid collection. VASCULATURE: Normal caliber aorta. Scattered atherosclerotic calcifications. Distended hepatic veins and IVC. Multiple left-sided varices and splenorenal shunt. Otherwise, limited evaluation without contrast. LYMPH NODES: No adenopathy. BONES and SOFT TISSUES: No aggressive osseous lesions. Multilevel degenerative changes of the spine. Similar severe compression deformity of the T12 vertebral  body with minimal posterior bony retropulsion. Similar appearance of nodular soft tissue with adjacent clips or surgical material in the left breast (6:3). Scattered soft tissue nodules and subcutaneous air in the anterior abdominal wall likely injection related. Diastases rectus with small fat-containing umbilical hernia. Nondisplaced minimally displaced right anterior fifth through seventh and nondisplaced left anterior seventh rib fractures, partially imaged.     -Multiple nondisplaced to minimally displaced anterior bilateral rib fractures as described, partially imaged. - No other acute process in the abdomen or pelvis. - Grossly similar masslike thickening of the lower sigmoid colon concerning for malignancy. Recommend further evaluation with colonoscopy. - Cirrhosis with sequela of portal hypertension. - Increased moderate right and small left pleural effusions with bibasilar atelectasis. - Additional chronic and incidental findings as described in the body of the report.     CT Head Wo Contrast  Result Date: 03/25/2024  EXAM: Computed tomography, head or brain without contrast material. ACCESSION: [id number] UN CLINICAL INDICATION: 78 years old Female with AMS  COMPARISON: CT head and MRI brain 03/24/2024 TECHNIQUE: Axial CT images of the head from skull base to vertex without contrast. FINDINGS: Chronic left cerebellar cortical infarct. Global cerebral atrophy with ex vacuo dilation of the CSF spaces. There are scattered and confluent hypodense foci within the periventricular and deep white matter.  These are nonspecific but commonly associated with small vessel ischemic changes. There is no midline shift. No mass lesion. There is no evidence of acute infarct.  The sinuses are pneumatized. Partially visualized endotracheal tube. Bilateral carotid siphon and V4 vertebral artery atherosclerotic calcifications. Small bilateral mastoid effusions. No intracranial hemorrhage or skull fractures.     No acute appearing intracranial abnormality.     XR Chest Portable  Result Date: 03/25/2024  EXAM: XR CHEST PORTABLE ACCESSION: [id number] UN REPORT DATE: 03/25/2024 2:19 PM CLINICAL INDICATION: endotracheal tube placement ; OTHER  TECHNIQUE: Single View AP Chest Radiograph. COMPARISON: Chest 03/25/2024 at 12:36 FINDINGS: Interval intubation with endotracheal tube 2.4 cm above the carina. Right hemithorax partially obscured by defibrillator pad. Unchanged moderate pulmonary edema and bilateral moderate pleural effusions with adjacent atelectasis. No pneumothorax. Stable enlarged cardiac silhouette.     *  Interval intubation with endotracheal tube 2.4 cm above the carina. *  Unchanged bilateral moderate pleural effusions with adjacent atelectasis and pulmonary edema.     XR Chest Portable  Result Date: 03/25/2024  EXAM: XR CHEST PORTABLE ACCESSION: [id number] UN REPORT DATE: 03/25/2024 1:30 PM CLINICAL INDICATION: ALTERED MENTAL STATUS  TECHNIQUE: Single View AP Chest Radiograph. COMPARISON: XR CHEST PORTABLE 03/13/2024, XR CHEST PORTABLE 03/11/2024 FINDINGS: Removal of endotracheal tube, right internal jugular catheter and enteric tube since 03/13/2024. Increased moderate pulmonary edema. Worsening bilateral moderate pleural effusions with adjacent atelectasis. No pneumothorax. Stable enlarged cardiac silhouette.     *  Removal of support devices since 03/13/2024 as described *  Worsening bilateral moderate pleural effusions with adjacent atelectasis and pulmonary edema.     MRI Brain W Wo Contrast  Result Date: 03/24/2024  EXAM: Magnetic resonance imaging, brain, without and with contrast material. ACCESSION: [id number] UN CLINICAL INDICATION: 78 years old Female with AMS, c/f embolic stroke iso LAA thrombus  COMPARISON: Same day CT head TECHNIQUE: Multiplanar, multisequence MR imaging of the brain was performed without and with I.V. contrast. FINDINGS:  Mild age-related global cerebral volume loss with ex vacuo dilatation of the CSF-containing spaces. There are scattered and confluent foci of signal abnormality within the periventricular and deep white matter.  These are nonspecific but commonly seen with  small vessel ischemic changes. Left cerebellar chronic infarct. Enlarged perivascular space in the right basal ganglia. Few scattered chronic microhemorrhages in the right parietal occipital lobe, bilateral basal ganglia and thalamus. There is no midline shift. No extra-axial fluid collection. No evidence of intracranial hemorrhage. No diffusion weighted signal abnormality to suggest acute infarct. No mass. There is no abnormal enhancement. Bilateral trace mastoid effusion.     No acute appearing intracranial abnormality. Moderate chronic small vessel ischemic changes.     CT Head Wo Contrast  Result Date: 03/24/2024  EXAM: Computed tomography, head or brain without contrast material. ACCESSION: [id number] UN CLINICAL INDICATION: 78 years old Female with Altered mental status iso known LAA thrombus  COMPARISON: Head CT 03/11/2024, 10/20/2022, 06/03/2017 TECHNIQUE: Axial CT images of the head from skull base to vertex without contrast. FINDINGS: There is no midline shift. No mass lesion. The sinuses are pneumatized. Scattered and confluent hypodensities in the periventricular white matter, likely sequela of chronic small vessel ischemic disease. Global cerebral atrophy with ex vacuo ventricular dilation. Small chronic left cerebellar infarct. Multiple age-indeterminate infarcts in the bilateral insula (2:16).  Additional more focal areas of hypoattenuation in the right frontal parietal lobe, for reference (4:25). No intracranial hemorrhage or skull fractures.     Extensive chronic microvascular changes and multiple remote infarcts as described. There are 2 new focal areas of hypoattenuation in the right frontoparietal lobe. Recommend brain MRI with without contrast for further evaluation.     XR Abdomen 1 View  Result Date: 03/14/2024  EXAM: XR ABDOMEN 1 VIEW ACCESSION: [id number] UN REPORT DATE: 03/14/2024 1:57 PM CLINICAL INDICATION: 78 years old with NGT (CATHETER VASCULAR FIT & ADJ)  COMPARISON: 03/11/2024 supine abdominal radiograph TECHNIQUE: Supine view of the abdomen. FINDINGS: Multiple clips project over the right breast and axilla. Bibasilar airspace disease. Interval removal of the unweighted esophagogastric tube. Placement of a second tube with tip projecting over the left iliac wing. Urethral catheter. Nonspecific bowel gas pattern without evidence of obstruction.     A second esophagogastric tube has been placed with tip projecting over the left iliac wing in the proximal small bowel.    XR Chest Portable  Result Date: 03/14/2024  EXAM: XR CHEST PORTABLE ACCESSION: [id number] UN REPORT DATE: 03/14/2024 12:17 AM CLINICAL INDICATION: ETT (VENTILATOR/RESPIRATOR DEP STATUS)  TECHNIQUE: Single View AP Chest Radiograph. COMPARISON: XR CHEST PORTABLE 03/11/2024 FINDINGS: Interval removal of left internal jugular vein approach central venous catheter, additional support devices are unchanged. Stable moderate pulmonary edema and small bilateral pleural effusions with bilateral medial basilar atelectasis. No pneumothorax. Stable cardiomediastinal silhouette.     Interval removal of left internal jugular vein approach central venous catheter, otherwise stable chest.     XR Abdomen 1 View  Result Date: 03/12/2024  EXAM: XR ABDOMEN 1 VIEW ACCESSION: [id number] UN REPORT DATE: 03/11/2024 11:34 PM CLINICAL INDICATION: 78 years old with NGT (CATHETER VASCULAR FIT & ADJ)  COMPARISON: CT abdomen and pelvis 03/11/2024 2:36 p.m. TECHNIQUE: Supine view of the abdomen, 1 image(s) FINDINGS: Enteric tube with tip and sideport over the expected location of stomach. Nonobstructive bowel gas pattern. At least moderate colonic stool burden. Right upper quadrant cholecystectomy clips are present. Degenerative changes of the visualized spine. Partially visualized bibasilar pulmonary opacities.     Enteric tube with tip and sideport overlying the expected location of stomach.     XR Chest Portable  Result Date: 03/12/2024  EXAM: XR CHEST PORTABLE ACCESSION: [id number] UN REPORT DATE: 03/11/2024 11:59 PM CLINICAL INDICATION: LINE CHECK (CATHETER VASCULAR FIT)  TECHNIQUE: Single View AP Chest Radiograph. COMPARISON: XR CHEST PORTABLE 03/11/2024 FINDINGS: Interval placement of right internal jugular approach central venous catheter with tip projecting over the superior cavoatrial junction. Similar-appearing left internal jugular catheter with tip projecting over the right brachiocephalic vein. Additional support devices are unchanged. Similar-appearing bibasilar hazy opacities and left retrocardiac opacification. Small bilateral pleural effusion. No pneumothorax. Normal heart size and mediastinal contours.     1.  Interval placement of right internal jugular approach central venous catheter with tip projecting over the superior cavoatrial junction. 2.  Similar-appearing left internal jugular catheter with tip projecting over the right brachiocephalic vein. Consider repositioning. 3.  Endotracheal tube tip measures 1.4 cm from the carina, differences in measurement may be due to patient positioning. Consider retraction by 1 to 2 cm.     CT Abdomen Pelvis Wo Contrast  Result Date: 03/11/2024  EXAM: CT ABDOMEN PELVIS WO CONTRAST ACCESSION: [id number] UN REPORT DATE: 03/11/2024 2:53 PM CLINICAL INDICATION: 78 years old with GNR bacteremia, source eval  COMPARISON: CT abdomen pelvis 10/20/2022. TECHNIQUE: A spiral CT scan was obtained without IV contrast from the lung bases to the pubic symphysis. Images were reconstructed in the axial plane. Coronal and sagittal reformatted images were also provided for further evaluation. Evaluation of the solid organs and vasculature is limited in the absence of intravenous contrast. FINDINGS: LOWER CHEST: Left basilar opacity. Moderate right and trace left pleural effusion. Coronary artery calcifications. Mitral annular calcifications. LIVER: Cirrhotic liver cirrhotic morphology. Right hepatic lobe calcification. BILIARY: Cholecystectomy. No biliary ductal dilatation. SPLEEN: Normal in size and contour. PANCREAS: Normal pancreatic contour without signs of inflammation or gross ductal dilation. ADRENAL GLANDS: Normal. KIDNEYS/URETERS: Smooth renal contours. Multiple left kidney nonobstructing renal stones. No ureteral or collecting system dilation. BLADDER: Under-distended secondary to Foley catheter, limiting evaluation REPRODUCTIVE ORGANS: Unremarkable. GI TRACT: Nonweighted esophagogastric tube tip and sideport terminating within the stomach. Gas-filled duodenal diverticulum arising from the third portion of the duodenum. There is masslike thickening and shouldering of the lower sigmoid colon (4:121, 6:35, 7:75). Colonic diverticulosis. No findings of bowel obstruction or acute inflammation.  Normal appendix. PERITONEUM, RETROPERITONEUM AND MESENTERY: No free air. No ascites. No fluid collection. LYMPH NODES: No adenopathy. VESSELS: Normal caliber aorta. Otherwise, limited evaluation without contrast. There are many left-sided abdominal varices secondary to portal hypertension. There is also a splenorenal shunt. BONES and SOFT TISSUES: No suspicious osseous lesion. No suspicious soft tissue lesion.     --Left basilar pulmonary opacity which is favored to predominantly reflect atelectasis. There is a moderate right and trace left pleural effusion. --There is masslike thickening involving the lower sigmoid colon concerning for colonic malignancy. Further evaluation with nonemergent colonoscopy is recommended. -There is cirrhotic morphology with multiple varices in the left hemiabdomen as well as a splenorenal shunt. The incidental finding concerning for sigmoid colon malignancy was discussed via Epic message with reply confirmation received with DR. MARCEL MART SELLERS by Dr. Donnice Hazard on 03/11/2024 3:12 PM.     CT Head Wo Contrast  Result Date: 03/11/2024  EXAM: Computed tomography, head or brain without contrast material. ACCESSION: [id number] UN CLINICAL INDICATION: 78 years old Female with AMS  COMPARISON: 10/20/2022. TECHNIQUE: Axial CT images of the head from skull base to vertex without contrast. FINDINGS: There is no midline shift. No mass lesion. There is no evidence of acute infarct.  The sinuses are pneumatized. Redemonstrated small chronic left cerebellar infarct. Confluent periventricular hypodensities likely related to chronic microvascular changes. Mild diffuse parenchymal volume loss. No intracranial  hemorrhage or skull fractures.     No acute intracranial abnormality.     XR Chest Portable  Result Date: 03/11/2024  EXAM: XR CHEST PORTABLE ACCESSION: [id number] UN REPORT DATE: 03/11/2024 2:54 PM CLINICAL INDICATION: CATHETER VASCULAR FIT&ADJ  TECHNIQUE: Single View AP Chest Radiograph. COMPARISON: Chest 03/11/2024 at 1:37 p.m. FINDINGS: Left internal jugular catheter advanced crossing the midline with the tip overlying the right brachiocephalic vein. Repositioning should be considered. Endotracheal tube about 3.3 cm above the carina. Enteric catheter unchanged Retrocardiac consolidation. Diffuse haziness overlying the right hemithorax favoring pulmonary edema with right basilar atelectasis and small right pleural effusion. No pneumothorax. Stable heart and mediastinum     Left internal jugular catheter advanced now overlying the right brachiocephalic vein.. Consider repositioning. Retrocardiac consolidation with bibasilar atelectasis and bilateral pleural effusions. Pulmonary edema.     XR Chest Portable  Result Date: 03/11/2024  EXAM: XR CHEST PORTABLE ACCESSION: [id number] UN REPORT DATE: 03/11/2024 2:48 PM CLINICAL INDICATION: LINE CHECK (CATHETER VASCULAR FIT)  TECHNIQUE: Single View AP Chest Radiograph. COMPARISON: Chest 03/11/2024 at 10:57 a.m. FINDINGS: Left internal jugular catheter projecting over the upper margin of the aortic arch presumably prior to brachiocephalic vein. Clinically evaluate. Endotracheal tube about 3.3 cm above the carina. New enteric catheter with the sideport and tip within the stomach. Retrocardiac consolidation, similar/increased. Diffuse haziness overlying the right hemithorax favoring pulmonary edema with right basilar atelectasis and small right pleural effusion. No pneumothorax. Stable heart and mediastinum     Left internal jugular catheter as described with tip projecting over the expected location of the distal internal jugular vein. Retrocardiac consolidation with bibasilar atelectasis and bilateral pleural effusions. Pulmonary edema. Overall appearance of the chest is slightly worse compared to earlier today     XR Abdomen 1 View  Result Date: 03/11/2024  EXAM: XR ABDOMEN 1 VIEW ACCESSION: [id number] UN REPORT DATE: 03/11/2024 1:54 PM CLINICAL INDICATION: 78 years old with NGT (CATHETER VASCULAR FIT & ADJ)  COMPARISON: Abdominal ultrasound same day. CT abdomen pelvis 10/20/2022. TECHNIQUE: Supine view of the abdomen, 3 image(s) FINDINGS: Esophagogastric tube tip and sideport project over the left upper quadrant. Foley catheter projects over the lower pelvis. Moderate colonic stool volume. Paucity of small bowel gas. Surgical staples project at the right upper quadrant. Lung bases are better evaluated on concurrent chest radiograph. Degenerative changes of the lumbar spine.     Esophagogastric tube within the stomach.     US  Abdomen Limited  Result Date: 03/11/2024  EXAM: US  ABDOMEN LIMITED ACCESSION: [id number] UN REPORT DATE: 03/11/2024 8:23 AM CLINICAL INDICATION: 78 years old with hx cirrhosis, eval for portal vein thrombosis or other cause of AMS  COMPARISON: CT abdomen pelvis 10/20/2022, CT chest 02/29/2024 TECHNIQUE: Static and cine images of the right upper quadrant were performed. Of note, exam was terminated early as patient became uncooperative during exam. FINDINGS: Limited exam as it was terminated prematurely due to patient condition. Within this limitation: LIVER: The liver was heterogeneous and coarse in echotexture with undulated contours. No visualized suspicious focal hepatic lesions with significant limited assessment. No biliary ductal dilatation. Hepatopetal flow within the main portal vein, with a flow rate of 0.17 m/s. The caliber of the main portal vein is small. Hepatopetal flow is also noted within the anterior right portal vein. Common hepatic artery is patent with normal flow pattern.      Liver: 13.1 cm      Common bile duct: 0.49 cm GALLBLADDER: The gallbladder is surgically absent. LIMITED RIGHT KIDNEY: No hydronephrosis.  Limited evaluation due to patient condition. Within this limitation: -Hepatopetal flow within the small caliber main portal vein, which demonstrates sluggish flow. -No definitive evidence of main portal vein thrombus on limited views. Can consider CT abdomen pelvis with contrast for further evaluation due to AMS.     XR Chest Portable  Result Date: 03/11/2024  EXAM: XR CHEST PORTABLE ACCESSION: [id number] UN REPORT DATE: 03/11/2024 11:07 AM CLINICAL INDICATION: ETT (VENTILATOR/RESPIRATOR DEP STATUS)  TECHNIQUE: Single View AP Chest Radiograph. 1 image COMPARISON: Chest x-ray 03/11/2024 FINDINGS: Endotracheal tube in place with the tip projecting 5.2 cm above carina. Left lower lobe collapse, unchanged from prior imaging. Unchanged bilateral peribronchial cuffing. Small bilateral pleural effusion are unchanged. No pneumothorax. Stable heart and mediastinum. Multiple surgical clips overlie the right lateral aspect of the thoracic wall and right lung base.     *  Endotracheal tube tip in place with the tip projecting 5.2 cm above carina. *  Unchanged interstitial pulmonary edema with left lower lobe collapse. *  Small bilateral pleural effusions.     XR Chest Portable  Result Date: 03/11/2024  EXAM: XR CHEST PORTABLE ACCESSION: [id number] UN REPORT DATE: 03/11/2024 10:15 AM CLINICAL INDICATION: CATHETER VASCULAR FIT&ADJ  TECHNIQUE: Single View AP Chest Radiograph. COMPARISON: XR CHEST PORTABLE 03/10/2024 FINDINGS: Endotracheal tube tip within the RIGHT mainstem bronchus. Left lower lobe collapse. Unchanged mild interstitial pulmonary edema. Small bilateral pleural effusions are unchanged. Cardiac silhouette is unchanged in size. Surgical clips overlie the right lateral aspect of the thoracic wall and right lung base.     Malpositioned endotracheal tube overlying the right mainstem bronchus with left lower lobe collapse. Recommend retraction by approximately 6 cm. Unchanged interstitial pulmonary edema with small bilateral pleural effusions.  ++++++++++++++++++++ The findings of this study were discussed via epic secure chat with DR. Zelda Moose by Dr. Reida Pipes on 03/11/2024 10:21 AM. -----------------------------------------------     XR Chest Portable  Result Date: 03/11/2024  EXAM: XR CHEST PORTABLE ACCESSION: [id number] UN REPORT DATE: 03/10/2024 9:09 PM CLINICAL INDICATION: ALTERED MENTAL STATUS  TECHNIQUE: Single View AP Chest Radiograph. COMPARISON: Chest radiograph 02/29/2024 FINDINGS: Mild interstitial edema, similar to prior. Small bilateral pleural effusions. No pneumothorax. Cardiac silhouette is unchanged in size.     Mild interstitial edema with small bilateral pleural effusions, similar to prior.     Echocardiogram Transesophageal Echo  Result Date: 03/10/2024  Patient Info Name:     [name] Age:     78 years DOB:     24-Mar-1946 Gender:     Female MRN:     [id number] Accession #:     [id number] UN Account #:     [id number] Ht:     157 cm Wt:     89 kg BSA:     2.02 m2 BP:     156 /     78 mmHg HR:     82 bpm Heart Rhythm:     Atrial Fibrillation Exam Date:     03/10/2024 8:49 AM Admit Date:     03/10/2024 Exam Type:     ECHOCARDIOGRAM TRANSESOPHAGEAL ECHO Technical Quality:     Good Staff Sonographer:     Karna Elk Referring Physician:     Marshia Carbo Max Ordering Physician:     Marshia Carbo Max Study Info Indications      - Cardioversion     I48.91 - Atrial fibrillation,  unspecified type  (CMS-HCC) Procedure(s)   Limited two-dimensional transesophageal examination is performed. Summary   1. There was dense  smoke present in the left atrium and a thrombus present in the left atrial appendage.   2. There is no thrombus seen in the right atrium.   3. There is a PFO by color flow Doppler. Anesthesia/Analgesia   Sedation was performed by anesthesiologist. Topical anesthesia was applied to the oropharynx. Procedure Details   The patient arrived in a fasting state. The procedure was explained and the consent form was signed. The patient tolerated the procedure well and there were no complications. There was no difficulty inserting the probe. Number of insertion attempts: 1. The probe was inserted by the cardiologist. The transesophageal probe was passed into the posterior pharynx, mid-esophagus, distal esophagus, and gastric fundus. The procedure was completed without complications. Left Atrium   There is a thrombus seen in the left atrium. Right Atrium   There is no thrombus seen in the right atrium. Other Findings   There is a PFO by color flow Doppler. Report Signatures Finalized by Velma Glendia Bevels  MD on 03/10/2024 12:56 PM     Ayron Fillinger E Brenly Trawick, MD  03/26/24  5:06 AM           [1]   Past Medical History:  Diagnosis Date    A-fib (CMS-HCC)     Alcoholism    (CMS-HCC)     Alcoholism /alcohol abuse     Cirrhosis    (CMS-HCC)     Depression     Hypertension     Liver disease     Malignant neoplasm of overlapping sites of right breast in female, estrogen receptor positive    (CMS-HCC) 10/03/2020   [2]   Past Surgical History:  Procedure Laterality Date    BREAST BIOPSY Right     benign-a long time ago    BREAST BIOPSY Right 07/2020    malignant    BREAST LUMPECTOMY Right     4 2022    CENTRAL LINE  03/25/2024    CHOLECYSTECTOMY      PR BX/REMV,LYMPH NODE,DEEP AXILL Right 09/03/2020    Procedure: BX/EXC LYMPH NODE; OPEN, DEEP AXILRY NODE;  Surgeon: Alm Elsie Como, MD;  [facility];  Service: Surgical Oncology Breast    PR CARDIOVERSION, ELECTIVE;EXTERN N/A 03/10/2024    Procedure: CARDIOVERSION, ELECTIVE, ELECTRICAL CONVERSION OF ARRHYTHMIA; EXTERNAL;  Surgeon: Bevels Velma Glendia, MD;  Location: [facility] OR [facility];  Service: Cardiology    PR ECHO HEART,TRANSESOPHAGEAL,COMPLETE Midline 03/10/2024    Procedure: ECHOCARDIOGRAPHY, TRANSESOPHAGEAL, REAL-TIME WITH IMAGE DOCUMENTATION;  Surgeon: Bevels Velma Glendia, MD;  Location[facility] OR [facility];  Service: Cardiology    PR INTRAOPERATIVE SENTINEL LYMPH NODE ID W DYE INJECTION Right 09/03/2020    Procedure: INTRAOPERATIVE IDENTIFICATION SENTINEL LYMPH NODE(S) INCLUDE INJECTION NON-RADIOACTIVE DYE, WHEN PERFORMED;  Surgeon: Alm Elsie Como, MD;  Location: [facility] OR [facility];  Service: Surgical Oncology Breast    PR MASTECTOMY, PARTIAL Right 09/03/2020    Procedure: MASTECTOMY, PARTIAL (EG, LUMPECTOMY, TYLECTOMY, QUADRANTECTOMY, SEGMENTECTOMY);  Surgeon: Alm Elsie Como, MD;  Location: [facility];  Service: Surgical Oncology Breast    PR UPPER GI ENDOSCOPY,BIOPSY N/A 02/03/2018    Procedure: UGI ENDOSCOPY; WITH BIOPSY, SINGLE OR MULTIPLE;  Surgeon: Eleanor Dewey Sorrel, MD;  Location: [facility] GI PROCEDURES [facility];  Service: Gastroenterology    RADIATION Right     unsure finish date    TUBAL LIGATION     [3]   Social History  Tobacco Use   Smoking Status Never    Passive exposure: Past   Smokeless  Tobacco Never   [4]   Family History  Problem Relation Age of Onset    Heart disease Mother     No Known Problems Father     No Known Problems Sister     No Known Problems Daughter     No Known Problems Maternal Grandmother     No Known Problems Maternal Grandfather     No Known Problems Paternal Grandmother     No Known Problems Paternal Grandfather     Lupus Brother     No Known Problems Other     BRCA 1/2 Neg Hx     Breast cancer Neg Hx     Cancer Neg Hx     Colon cancer Neg Hx     Endometrial cancer Neg Hx     Ovarian cancer Neg Hx     Mental illness Neg Hx     Substance Abuse Disorder Neg Hx

## 2024-03-27 LAB — CBC
HEMATOCRIT: 15.1 % — ABNORMAL LOW (ref 34.0–44.0)
HEMATOCRIT: 18 % — ABNORMAL LOW (ref 34.0–44.0)
HEMATOCRIT: 19.5 % — ABNORMAL LOW (ref 34.0–44.0)
HEMATOCRIT: 22.7 % — ABNORMAL LOW (ref 34.0–44.0)
HEMATOCRIT: 23.2 % — ABNORMAL LOW (ref 34.0–44.0)
HEMATOCRIT: 25 % — ABNORMAL LOW (ref 34.0–44.0)
HEMOGLOBIN: 5 g/dL — ABNORMAL LOW (ref 11.3–14.9)
HEMOGLOBIN: 6 g/dL — ABNORMAL LOW (ref 11.3–14.9)
HEMOGLOBIN: 6.5 g/dL — ABNORMAL LOW (ref 11.3–14.9)
HEMOGLOBIN: 7.7 g/dL — ABNORMAL LOW (ref 11.3–14.9)
HEMOGLOBIN: 8 g/dL — ABNORMAL LOW (ref 11.3–14.9)
HEMOGLOBIN: 8.6 g/dL — ABNORMAL LOW (ref 11.3–14.9)
MEAN CORPUSCULAR HEMOGLOBIN CONC: 33.1 g/dL (ref 32.0–36.0)
MEAN CORPUSCULAR HEMOGLOBIN CONC: 33.3 g/dL (ref 32.0–36.0)
MEAN CORPUSCULAR HEMOGLOBIN CONC: 33.3 g/dL (ref 32.0–36.0)
MEAN CORPUSCULAR HEMOGLOBIN CONC: 33.7 g/dL (ref 32.0–36.0)
MEAN CORPUSCULAR HEMOGLOBIN CONC: 34.2 g/dL (ref 32.0–36.0)
MEAN CORPUSCULAR HEMOGLOBIN CONC: 34.6 g/dL (ref 32.0–36.0)
MEAN CORPUSCULAR HEMOGLOBIN: 32 pg (ref 25.9–32.4)
MEAN CORPUSCULAR HEMOGLOBIN: 32.2 pg (ref 25.9–32.4)
MEAN CORPUSCULAR HEMOGLOBIN: 32.4 pg (ref 25.9–32.4)
MEAN CORPUSCULAR HEMOGLOBIN: 32.8 pg — ABNORMAL HIGH (ref 25.9–32.4)
MEAN CORPUSCULAR HEMOGLOBIN: 32.8 pg — ABNORMAL HIGH (ref 25.9–32.4)
MEAN CORPUSCULAR HEMOGLOBIN: 33.6 pg — ABNORMAL HIGH (ref 25.9–32.4)
MEAN CORPUSCULAR VOLUME: 94.7 fL (ref 77.6–95.7)
MEAN CORPUSCULAR VOLUME: 95.5 fL (ref 77.6–95.7)
MEAN CORPUSCULAR VOLUME: 96.7 fL — ABNORMAL HIGH (ref 77.6–95.7)
MEAN CORPUSCULAR VOLUME: 97 fL — ABNORMAL HIGH (ref 77.6–95.7)
MEAN CORPUSCULAR VOLUME: 98.4 fL — ABNORMAL HIGH (ref 77.6–95.7)
MEAN CORPUSCULAR VOLUME: 98.5 fL — ABNORMAL HIGH (ref 77.6–95.7)
MEAN PLATELET VOLUME: 9.1 fL (ref 6.8–10.7)
MEAN PLATELET VOLUME: 9.2 fL (ref 6.8–10.7)
MEAN PLATELET VOLUME: 9.4 fL (ref 6.8–10.7)
MEAN PLATELET VOLUME: 9.7 fL (ref 6.8–10.7)
MEAN PLATELET VOLUME: 9.7 fL (ref 6.8–10.7)
MEAN PLATELET VOLUME: 9.8 fL (ref 6.8–10.7)
PLATELET COUNT: 36 10*9/L — ABNORMAL LOW (ref 150–450)
PLATELET COUNT: 58 10*9/L — ABNORMAL LOW (ref 150–450)
PLATELET COUNT: 60 10*9/L — ABNORMAL LOW (ref 150–450)
PLATELET COUNT: 66 10*9/L — ABNORMAL LOW (ref 150–450)
PLATELET COUNT: 77 10*9/L — ABNORMAL LOW (ref 150–450)
PLATELET COUNT: 86 10*9/L — ABNORMAL LOW (ref 150–450)
RED BLOOD CELL COUNT: 1.56 10*12/L — ABNORMAL LOW (ref 3.95–5.13)
RED BLOOD CELL COUNT: 1.83 10*12/L — ABNORMAL LOW (ref 3.95–5.13)
RED BLOOD CELL COUNT: 1.98 10*12/L — ABNORMAL LOW (ref 3.95–5.13)
RED BLOOD CELL COUNT: 2.38 10*12/L — ABNORMAL LOW (ref 3.95–5.13)
RED BLOOD CELL COUNT: 2.39 10*12/L — ABNORMAL LOW (ref 3.95–5.13)
RED BLOOD CELL COUNT: 2.64 10*12/L — ABNORMAL LOW (ref 3.95–5.13)
RED CELL DISTRIBUTION WIDTH: 16.1 % — ABNORMAL HIGH (ref 12.2–15.2)
RED CELL DISTRIBUTION WIDTH: 16.5 % — ABNORMAL HIGH (ref 12.2–15.2)
RED CELL DISTRIBUTION WIDTH: 16.6 % — ABNORMAL HIGH (ref 12.2–15.2)
RED CELL DISTRIBUTION WIDTH: 18.6 % — ABNORMAL HIGH (ref 12.2–15.2)
RED CELL DISTRIBUTION WIDTH: 18.8 % — ABNORMAL HIGH (ref 12.2–15.2)
RED CELL DISTRIBUTION WIDTH: 19.1 % — ABNORMAL HIGH (ref 12.2–15.2)
WBC ADJUSTED: 3.3 10*9/L — ABNORMAL LOW (ref 3.6–11.2)
WBC ADJUSTED: 4.6 10*9/L (ref 3.6–11.2)
WBC ADJUSTED: 7.1 10*9/L (ref 3.6–11.2)
WBC ADJUSTED: 7.9 10*9/L (ref 3.6–11.2)
WBC ADJUSTED: 7.9 10*9/L (ref 3.6–11.2)
WBC ADJUSTED: 9.2 10*9/L (ref 3.6–11.2)

## 2024-03-27 LAB — BLOOD GAS CRITICAL CARE PANEL, ARTERIAL
BASE EXCESS ARTERIAL: 2.7 — ABNORMAL HIGH (ref -2.0–2.0)
BASE EXCESS ARTERIAL: 5 — ABNORMAL HIGH (ref -2.0–2.0)
CALCIUM IONIZED ARTERIAL (MG/DL): 4.13 mg/dL — ABNORMAL LOW (ref 4.40–5.40)
CALCIUM IONIZED ARTERIAL (MG/DL): 4.96 mg/dL (ref 4.40–5.40)
CARBOXYHEMOGLOBIN: 2 % — ABNORMAL HIGH (ref <1.2)
CARBOXYHEMOGLOBIN: 2.3 % — ABNORMAL HIGH (ref <1.2)
CHLORIDE, WHOLE BLOOD: 109 mmol/L — ABNORMAL HIGH (ref 98–107)
CHLORIDE, WHOLE BLOOD: 109 mmol/L — ABNORMAL HIGH (ref 98–107)
GLUCOSE WHOLE BLOOD: 141 mg/dL (ref 70–179)
GLUCOSE WHOLE BLOOD: 143 mg/dL (ref 70–179)
HCO3 ARTERIAL: 26 mmol/L (ref 22–27)
HCO3 ARTERIAL: 29 mmol/L — ABNORMAL HIGH (ref 22–27)
HEMOGLOBIN BLOOD GAS: 7.5 g/dL — ABNORMAL LOW (ref 12.00–16.00)
HEMOGLOBIN BLOOD GAS: 6.1 g/dL — ABNORMAL LOW (ref 12.00–16.00)
LACTATE BLOOD ARTERIAL: 2 mmol/L — ABNORMAL HIGH (ref <1.3)
LACTATE BLOOD ARTERIAL: <1.3 mmol/L (ref <1.3)
METHEMOGLOBIN: <1 % (ref <1.5)
METHEMOGLOBIN: <1 % (ref <1.5)
O2 SATURATION ARTERIAL: >100 % — ABNORMAL HIGH (ref 94.0–100.0)
O2 SATURATION ARTERIAL: >100 % — ABNORMAL HIGH (ref 94.0–100.0)
OXYHEMOGLOBIN: 97.6 % (ref 94.0–100.0)
OXYHEMOGLOBIN: 97.5 % (ref 94.0–100.0)
PCO2 ARTERIAL: 37.5 mmHg (ref 35.0–45.0)
PCO2 ARTERIAL: 41.3 mmHg (ref 35.0–45.0)
PH ARTERIAL: 7.46 — ABNORMAL HIGH (ref 7.35–7.45)
PH ARTERIAL: 7.46 — ABNORMAL HIGH (ref 7.35–7.45)
PO2 ARTERIAL: 158 mmHg — ABNORMAL HIGH (ref 80.0–110.0)
PO2 ARTERIAL: 149 mmHg — ABNORMAL HIGH (ref 80.0–110.0)
POTASSIUM WHOLE BLOOD: 4.3 mmol/L (ref 3.4–4.6)
POTASSIUM WHOLE BLOOD: 3.5 mmol/L (ref 3.4–4.6)
SODIUM WHOLE BLOOD: 141 mmol/L (ref 135–145)
SODIUM WHOLE BLOOD: 142 mmol/L (ref 135–145)

## 2024-03-27 LAB — HEPATIC FUNCTION PANEL
ALBUMIN: 2.8 g/dL — ABNORMAL LOW (ref 3.4–5.0)
ALKALINE PHOSPHATASE: 66 U/L (ref 46–116)
ALT (SGPT): 21 U/L (ref 10–49)
AST (SGOT): 45 U/L — ABNORMAL HIGH (ref <=34)
BILIRUBIN DIRECT: 1.1 mg/dL — ABNORMAL HIGH (ref 0.00–0.30)
BILIRUBIN TOTAL: 2.2 mg/dL — ABNORMAL HIGH (ref 0.3–1.2)
PROTEIN TOTAL: 4.8 g/dL — ABNORMAL LOW (ref 5.7–8.2)

## 2024-03-27 LAB — BLOOD GAS, VENOUS
BASE EXCESS VENOUS: 5.9 — ABNORMAL HIGH (ref -2.0–2.0)
CARBOXYHEMOGLOBIN, VENOUS: 2 % — ABNORMAL HIGH (ref <1.2)
FIO2 VENOUS: 40
HCO3 VENOUS: 31 mmol/L — ABNORMAL HIGH (ref 22–27)
METHEMOGLOBIN, VENOUS: <1 % (ref <1.5)
O2 SATURATION VENOUS: 78.5 % (ref 40.0–85.0)
OXYHEMOGLOBIN, VENOUS: 76.5 % (ref 40.0–85.0)
PCO2 VENOUS: 47 mmHg (ref 40–60)
PH VENOUS: 7.43 (ref 7.32–7.43)
PO2 VENOUS: 43 mmHg (ref 30–55)

## 2024-03-27 LAB — PHOSPHORUS: PHOSPHORUS: 3.3 mg/dL (ref 2.4–5.1)

## 2024-03-27 LAB — BASIC METABOLIC PANEL
ANION GAP: 12 mmol/L (ref 5–14)
BLOOD UREA NITROGEN: 21 mg/dL (ref 9–23)
BUN / CREAT RATIO: 21
CALCIUM: 9.1 mg/dL (ref 8.7–10.4)
CHLORIDE: 104 mmol/L (ref 98–107)
CO2: 30 mmol/L (ref 20.0–31.0)
CREATININE: 0.99 mg/dL (ref 0.55–1.02)
EGFR CKD-EPI (2021) FEMALE: 59 mL/min/1.73m2 — ABNORMAL LOW (ref >=60)
GLUCOSE RANDOM: 146 mg/dL (ref 70–179)
POTASSIUM: 3.8 mmol/L (ref 3.4–4.8)
SODIUM: 146 mmol/L — ABNORMAL HIGH (ref 135–145)

## 2024-03-27 LAB — CYSTATIN C
CYSTATIN C: 2.02 mg/L — ABNORMAL HIGH (ref 0.64–1.23)
EGFR CKD-EPI (2012) CYSTATIN C FEMALE: 27 mL/min/1.73m2 — ABNORMAL LOW (ref >=60)

## 2024-03-27 LAB — MAGNESIUM: MAGNESIUM: 2.3 mg/dL (ref 1.6–2.6)

## 2024-03-27 LAB — VANCOMYCIN, TROUGH: VANCOMYCIN TROUGH: 23.3 ug/mL (ref 10.0–20.0)

## 2024-03-27 LAB — SODIUM: SODIUM: 146 mmol/L — ABNORMAL HIGH (ref 135–145)

## 2024-03-27 MED ADMIN — vasopressin 20 units in 100 mL (0.2 units/mL) infusion premade vial: .03 [IU]/min | INTRAVENOUS | @ 10:00:00 | Stop: 2024-03-27

## 2024-03-27 MED ADMIN — lactulose oral solution: 20 g | GASTROENTERAL | @ 13:00:00

## 2024-03-27 MED ADMIN — lactulose oral solution: 20 g | GASTROENTERAL | @ 21:00:00

## 2024-03-27 MED ADMIN — lactulose oral solution: 20 g | GASTROENTERAL | @ 17:00:00

## 2024-03-27 MED ADMIN — lactulose oral solution: 20 g | GASTROENTERAL | @ 01:00:00

## 2024-03-27 MED ADMIN — pantoprazole (Protonix) injection 40 mg: 40 mg | INTRAVENOUS | @ 13:00:00

## 2024-03-27 MED ADMIN — pantoprazole (Protonix) injection 40 mg: 40 mg | INTRAVENOUS | @ 01:00:00

## 2024-03-27 MED ADMIN — sodium chloride (NS) 0.9 % flush 10 mL: 10 mL | INTRAVENOUS | @ 10:00:00

## 2024-03-27 MED ADMIN — sodium chloride (NS) 0.9 % flush 10 mL: 10 mL | INTRAVENOUS | @ 02:00:00

## 2024-03-27 MED ADMIN — sodium chloride (NS) 0.9 % flush 10 mL: 10 mL | INTRAVENOUS | @ 18:00:00

## 2024-03-27 MED ADMIN — NORepinephrine 8 mg in dextrose 5 % 250 mL (32 mcg/mL) infusion PMB: 0-30 ug/min | INTRAVENOUS | @ 01:00:00

## 2024-03-27 MED ADMIN — NORepinephrine 8 mg in dextrose 5 % 250 mL (32 mcg/mL) infusion PMB: 0-30 ug/min | INTRAVENOUS | @ 11:00:00

## 2024-03-27 MED ADMIN — folic acid (FOLVITE) tablet 1 mg: 1 mg | ORAL | @ 13:00:00

## 2024-03-27 MED ADMIN — cefepime (MAXIPIME) 2 g in sodium chloride 0.9 % (NS) 100 mL IVPB-MBP: 2 g | INTRAVENOUS | @ 02:00:00 | Stop: 2024-03-28

## 2024-03-27 MED ADMIN — cefepime (MAXIPIME) 2 g in sodium chloride 0.9 % (NS) 100 mL IVPB-MBP: 2 g | INTRAVENOUS | @ 18:00:00 | Stop: 2024-03-28

## 2024-03-27 MED ADMIN — cefepime (MAXIPIME) 2 g in sodium chloride 0.9 % (NS) 100 mL IVPB-MBP: 2 g | INTRAVENOUS | @ 09:00:00 | Stop: 2024-03-28

## 2024-03-27 MED ADMIN — albumin human 25 % 50 g: 50 g | INTRAVENOUS | @ 06:00:00 | Stop: 2024-03-27

## 2024-03-27 MED ADMIN — cyanocobalamin (vitamin B-12) tablet 1,000 mcg: 1000 ug | ORAL | @ 13:00:00

## 2024-03-27 MED ADMIN — sodium chloride (NS) 0.9 % flush 3 mL: 3 mL | INTRAVENOUS | @ 16:00:00

## 2024-03-27 MED ADMIN — octreotide 500 mcg in sodium chloride 0.9 % 100 mL (5 mcg/mL) infusion: 50 ug/h | INTRAVENOUS | @ 23:00:00

## 2024-03-27 MED ADMIN — octreotide 500 mcg in sodium chloride 0.9 % 100 mL (5 mcg/mL) infusion: 50 ug/h | INTRAVENOUS | @ 11:00:00

## 2024-03-27 MED ADMIN — octreotide 500 mcg in sodium chloride 0.9 % 100 mL (5 mcg/mL) infusion: 50 ug/h | INTRAVENOUS | @ 01:00:00

## 2024-03-27 MED ADMIN — thiamine mononitrate (vit B1) tablet 100 mg: 100 mg | ORAL | @ 13:00:00

## 2024-03-27 NOTE — Plan of Care (Signed)
 Patient was switched to earlier in shift PSV 10/8 40%. Patient airway is patent and secured.     Problem: Mechanical Ventilation Invasive  Goal: Effective Communication  Outcome: Ongoing - Unchanged  Goal: Optimal Device Function  Outcome: Ongoing - Unchanged  Intervention: Optimize Device Care and Function  Recent Flowsheet Documentation  Taken 03/27/2024 0827 by Debby Sierras, RRT  Oral Care:   mouth swabbed   teeth brushed   suction provided   tongue brushed  Goal: Mechanical Ventilation Liberation  Outcome: Ongoing - Unchanged  Goal: Optimal Nutrition Delivery  Outcome: Ongoing - Unchanged  Goal: Absence of Device-Related Skin and Tissue Injury  Outcome: Ongoing - Unchanged  Goal: Absence of Ventilator-Induced Lung Injury  Outcome: Ongoing - Unchanged  Intervention: Prevent Ventilator-Associated Pneumonia  Recent Flowsheet Documentation  Taken 03/27/2024 0827 by Debby Sierras, RRT  Head of Bed [facility]) Positioning: HOB at 30-45 degrees  Oral Care:   mouth swabbed   teeth brushed   suction provided   tongue brushed

## 2024-03-27 NOTE — Consults (Signed)
 Hepatology Consult Service   Progress Note         Assessment and Recommendations:   [name] is a 78 y.o. female with a PMHx of HFpEF, HTN, Afib on AC, MASLD cirrhosis who presented to [facility] with originally hospitalized for scheduled TEE/DCCV (aborted d/t LAA clot) c/b AHHRF, encephalopathy and septic shock requiring MICU care. Extubated and transferred to [facility] but had to intubated and transferred back to [facility] MICU for closer monitoring for respiratory status, pressor requirement, and management of carotid artery injury. The patient is seen in consultation at the request of Kieran G Leong, DO (Medical ICU (MDI)) for UGIB.     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 1.5L of clot and frank blood suctioned. 10mm bleeding Mallory-Weiss tear found and clipped with Ovesco device. Exam was otherwise normal. OG tube replaced. Carotic and RIJ fistula closed so no longer needs surgery with vascular.   -Continue IV PPI BID for three days or while intubated, can transition to oral PPI BID for 8 weeks   -Monitor hgb per MICU team       #MASLD cirrhosis  #Possible HE  Patient previously seen by our service for possible HE. There was c/f HE and patient was on lactulose  but it was stopped due to no asterixis on exam. Patient had PEA as above. Currently intubated and sedated. EGD as above. No esophageal varices noted.   -Would continue to hold lactulose     Issues Impacting Complexity of Management:  -None    Recommendations discussed with the patient's primary team. We will continue to follow along with you.    Subjective:   Hgb this am 6.0 but recheck 8.6.         Objective:   Pulse:  [73-98] 87  SpO2 Pulse:  [68-99] 85  Resp:  [0-22] 15  BP: (81-125)/(40-97) 81/60  FiO2 (%):  [35 %-100 %] 40 %  SpO2:  [100 %] 100 %    Gen: Chronically ill-appearing female in NAD, intubated but weaned off sedation and can appropriately interact  Eyes: Sclera anicteric  Abdomen: Normoactive bowel sounds, soft, NTND, no rebound/guarding, no hepatosplenomegaly  Extremities: No clubbing, cyanosis, or edema in the BLEs  Neuro: Intubated, can respond to Y/N questions  Skin: No rashes, lesions on clothed exam  Psych: Alert, normal mood and affect.     Pertinent Labs & Studies:  -I have reviewed the patient's labs from 03/27/24 which show down-trending Hgb

## 2024-03-27 NOTE — Consults (Signed)
 Vancomycin Therapeutic Monitoring Pharmacy Note    [name] is a 78 y.o. female continuing vancomycin. Date of therapy initiation: 10/24    Indication: Bacteremia/Sepsis    Prior Dosing Information: Current regimen Vancomycin 750mg  IV q12h      Goals:  Therapeutic Drug Levels  Vancomycin trough goal: 10-15 mg/L    Additional Clinical Monitoring/Outcomes  Renal function, volume status (intake and output)    Results: Vancomycin level 23.3 mg/L, drawn appropriately    Wt Readings from Last 1 Encounters:   03/26/24 92 kg (202 lb 13.2 oz)     Creatinine   Date Value Ref Range Status   03/27/2024 0.99 0.55 - 1.02 mg/dL Final   89/74/7974 9.38 0.55 - 1.02 mg/dL Final   89/75/7974 9.42 0.55 - 1.02 mg/dL Final        Pharmacokinetic Considerations and Significant Drug Interactions:  Adult (calculated on 10/26): Vd = 63 L, ke = 0.032 hr-1  Concurrent nephrotoxic meds: not applicable    Assessment/Plan:  Recommendation(s)  Change current regimen to vancomycin 1000mg  q24h  Per kinetics, patient will be in therapeutic range at 1800 tonight (24 hours from last dose). Patient has not had any changes in renal function, therefore electing to continue a schedule regimen at a lower dose.  Will schedule a random level 10/27 AM to assess clearance of vanc and reconsider if dose by level is indicated  Estimated trough on recommended regimen: 12 mg/L    Follow-up  Level due: prior to fourth or fifth dose  A pharmacist will continue to monitor and order levels as appropriate    Please page service pharmacist with questions/clarifications.    Tinnie Clause, PharmD  PGY2 Critical Care Pharmacy Resident

## 2024-03-27 NOTE — Progress Notes (Signed)
 MICU Daily Progress Note     Date of Service: 03/27/2024    Problem List:   Principal Problem:    Bacteremia, escherichia coli  Active Problems:    Alcoholic liver disease (HHS-HCC)    HTN (hypertension)    Depression with anxiety    Class 2 severe obesity with serious comorbidity in adult    Atrial fibrillation    (CMS-HCC)    Pleural effusion    Hypernatremia    Thrombocytopenia    Cirrhosis    (CMS-HCC)    A-fib (CMS-HCC)    Hepatic encephalopathy    (CMS-HCC)      Interval history: [name] is a 78 y.o. female with HFpEF, HTN, Afib on AC, MASLD cirrhosis, originally hospitalized for scheduled TEE/DCCV (aborted d/t LAA clot) c/b AHHRF, encephalopathy and septic shock requiring MICU care, now s/p extubation transferred to [facility] for further management of anticoagulation and hypernatremia. Transferred to [facility] MICU for closer monitoring for respiratory status, pressor requirement, GI bleed, and management of carotid artery injury.     On 10/24, patient had acute worsening of respiratory status leading requiring intubation. When patient was given sedation for intubation, she coded and promptly had ROSC after a few minutes of CPR. Subsequently, CVC placed in neck, used to draw blood, unclear if given pressors through it, then found  to be in arterial system based on blood gas and imaging, likely right carotid. Line was subsequently removed, and pressure was held for 15 minutes, hemostasis with no hematoma. She was transferred to [facility] MICU for management of this carotid injury, to be evaluated by vascular surgery while also managing her increased vasopressor requirement and respiratory distress (2 pressure shock and intubated on arrival).    Neurological   Analgosedation  Altered mental status with history of hepatic encephalopathy  Per chart review at [facility], rapid response called on 10/24 for increased somnolence. At the time, differential included hepatic encephalopathy, toxic metabolic encephalopathy in the setting of shock (possibly sepsis), versus intracranial pathology. She was subsequently intubated. CT head on 10/24 showed no acute intracranial abnormality. On 30mcg/min of propofol  on arrival.   - Continue Propofol , wean as tolerated to assess neurological status  - Goal RASS 0 to -1  - Continue lactulose     Analgesia: No pain issues  RASS at goal? Yes  [place] Agitation Assessment Scale (RASS) : -2 (03/27/2024  8:00 AM)    Pulmonary   Acute Hypoxic Hypercarbic Respiratory Failure c/f septic shock   Rapid response called on 10/24 for increased somnolence, found to have acute hypercarbic respiratory failure and subsequently intubated. Suspect hypercarbic respiratory failure due to encephalopathy as above. CXR on 10/24 showed worsening pleural effusions. Not currently getting diuresed especially in the setting of 2 pressor shock. Given patient's increased pressor requirement, hypoxia, and AMS, concern for infectious etiology. MRSA nares negative.  - Discontinue vanc  - SBT today, plan to extubate  - LRCx, Bcx pending  - follow ABG, lactate, CBC  - continue cefepime     Vent Mode: PRVC  S RR:  [12] 12  FiO2 (%):  [35 %-100 %] 40 %  S VT:  [340 mL] 340 mL  PC Set:  [12] 12  PR SUP:  [10 cm H20] 10 cm H20  O2 Device: Ventilator    Cardiovascular   Carotid Artery Injury, resolved  At [facility], CVC placed intended for RIJ on 10/24 following intubation, PEA, ROSC. Was used with known blood return. Unclear if it was used  for pressor support, but highly likely that it was. CXR for placement check showed concern for intra aterial location, and blood gas confirmed it was arterial. Thought to be in carotid artery. CVC removed with pressure held for 15 minutes, with no hematoma formation. On arrival, no physical exam and bedside ultrasound showed no large hematoma. No active bleeding.   - vascular surgery consulted, stating fistula has healed on US     PEA arrest s/p ROSC; shock; atrial fibrillation, known left atrial appendage clot, HFpEF  Patient with PEA arrest peri-intubation with ROSC. Shock favored to be distributive in nature, possibly sepsis. History of atrial fibrillation with known LAA clot and HFpEF.   - Hold Coreg , eliquis, GDMT given hemodynamic instability, consider restarting once more clinically stable  - Currently on NE   - Discontinue vasopressin    Renal   Hypernatremia, resolving  At [facility], had persistently hypernatremia near 153. Had been treated with D5 gtt.   - Strict I/O's  - Sodium check q6hours     Infectious Disease/Autoimmune   Rapid response called 10/24, patient noted to be hypothermic. WBCs jumped from 3.0 to 9.7 on 10/24. Although the most recent lab was following intubation and CPR. UA with pyuria, rare bacteria, hematuria. Peri-intubation, patient developed significant hypotension requiring initiation of NE and vasopressin. Suspect possible septic shock with unknown infectious source. Patient started on vancomycin and cefepime 10/24. CT Abdomen pelvis concerning for aspiration pneumonia. Of note, earlier in hospitalization had GNR bacteremia thought to have urinary source  - continue cefepime  - vancomycin discontinued as MRSA nares negative  - follow blood, urine, lower resp culture    Cultures:  Blood Culture, Routine (no units)   Date Value   03/25/2024 No Growth at 24 hours   03/25/2024 No Growth at 24 hours     Urine Culture, Comprehensive (no units)   Date Value   03/25/2024 <10,000 CFU/mL     WBC (10*9/L)   Date Value   03/27/2024 7.9     WBC, UA (/HPF)   Date Value   03/25/2024 25 (H)       FEN/GI   Sigmoid Mass c/f Malignancy and Hematochezia  Hemorrhagic Shock  CT abdomen pelvis done 10/24 with evidence of cirrhosis and masslike thickening of the lower sigmoid colon concerning for malignancy.  CTA abdomen pelvis performed 1024 with active extravasation into the gastric fundus possibly secondary to traumatic enteric tube placement.  GI scoped the patient on 10/25 and clipped an area of active bleeding.  They removed a total of 1.5 L of blood.  Hemoglobin 10/20 6 AM downtrended to 6.0, so 2 units of blood transfused.  GI stated this was consistent with 10/25 scope and likely did not represent increased bleeding.  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 Given shock of unclear etiology. CTAP demonstrating cirrhosis. Likely that cirrhosis is contributing to hypotension.   - Continue lactulose   - NGT in place  - Daily MELD labs and CMP  - clotting factors     MELD 3.0: 14 at 03/27/2024  5:19 AM  MELD-Na: 11 at 03/27/2024  5:19 AM  Calculated from:  Serum Creatinine: 0.99 mg/dL (Using min of 1 mg/dL) at 89/73/7974  4:80 AM  Serum Sodium: 146  mmol/L (Using max of 137 mmol/L) at 03/27/2024  5:19 AM  Total Bilirubin: 2.2 mg/dL at 89/73/7974  4:80 AM  Serum Albumin: 2.8 g/dL at 89/73/7974  4:80 AM  INR(ratio): 1.19 at 03/25/2024 10:27 PM  Age at listing (hypothetical): 78 years  Sex: Female at 03/27/2024  5:19 AM    Provider Malnutrition Assessment:  Body mass index is 34.8 kg/m??.BMI Interpretation: >/= 30 and < 40, consistent with obesity, clinically significant requiring additional resources and complicating multiple aspects of patient care.  GLIM criteria:   Pt does not meet criteria  -I have screened this patient for malnutrition and they did NOT meet criteria for malnutrition based on GLIM criteria.    Heme/Coag   Acute blood loss anemia  Hemoglobin down trended to 6.0 on the morning of 10/26 thought to be secondary to acute GI bleed that was scoped and clipped 10/25.  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 Reason: Not indicated.    Prophylaxis/LDA/Restraints/Consults   ICU Checklist completed: no (see ICU rounding navigator in Epic)    Patient Lines/Drains/Airways Status       Active Active Lines, Drains, & Airways       Name Placement date Placement time Site Days    ETT  7.5 03/25/24  1317  -- 1    CVC Triple Lumen 03/25/24 Non-tunneled Left Internal jugular 03/25/24  2316  Internal jugular  1    NG/OG Tube 18 Fr. Center mouth 03/25/24  1800  Center mouth  1    Urethral Catheter Temperature probe;Non-latex 03/25/24  1354  Temperature probe;Non-latex  1    Peripheral IV 03/20/24 Anterior;Left;Upper Arm 03/20/24  1730  Arm  6    Peripheral IV 03/25/24 Left;Posterior Hand 03/25/24  1950  Hand  1                  Patient Lines/Drains/Airways Status       Active Wounds       Name Placement date Placement time Site Days    Wound 03/01/24 Other (Comment) Toe (Comment which one) Left;Other (Comment) Blister present on arrival, tx already in outpatient setting, in process of healing, surrounding erythema 03/01/24  0129  Toe (Comment which one)  26    Wound 03/13/24 Irritant Contact Dermatitis Incontinence Sacrum Mid gluteal cleft MASD? 03/13/24  --  Sacrum  14                  Goals of Care     Code Status:   Orders Placed This Encounter   Procedures    Full Code     Standing Status:   Standing     Number of Occurrences:   1        Designated Healthcare Decision Maker:  Ms. Dugue designated healthcare decision maker(s) is/are   HCDM (patient stated preference): Petitfrere,[name] - Spouse - [phone]    HCDM, First AlternateQuamesha, [place] - Daughter - [phone]. See HCDM section of Epic sidebar/storyboard or ACP tab in patient chart for details regarding active HCDMs and patient capacity for decision-making.      Subjective     Patient is resting comfortably in bed intubated on the ventilator.  She is alert and  following commands.    Objective     Vitals - past 24 hours  Pulse:  [66-98] 87  SpO2 Pulse:  [65-99] 85  Resp:  [0-22] 15  BP: (105-116)/(45-97) 105/45  FiO2 (%):  [35 %-100 %] 40 %  SpO2:  [100 %] 100 % Intake/Output  I/O last 3 completed shifts:  In: 3759.6 [I.V.:2116.2; Blood:925; IV Piggyback:718.3]  Out: 570 [Urine:570]     Physical Exam:    General: intubated, alert  HEENT: normocephalic, atraumatic   CV: pulses palpable, RRR   Pulm: intubated and sedated, equal expansion  GI: soft, mildly distended  MSK: No lower extremity edema, no clubbing or cyanosis  Skin: numerous large flat violaceous hematomas on left arm, chest, neck bilaterally.   Neuro: Spontaneously moving extremities and following commands    Continuous Infusions:   Infusions Meds[1]    Scheduled Medications:   Scheduled Medications[2]    PRN medications:  PRN Medications[3]    Data/Imaging Review: Reviewed in Epic and personally interpreted on 03/27/2024. See EMR for detailed results.    Prentice Staples, MD  [facility] Emergency Medicine, PGY-1            [1]    NORepinephrine bitartrate-NS 8 mcg/min (03/27/24 1000)    octreotide infusion 50 mcg/hr (03/27/24 0800)   [2]    [Provider Hold] anastrozole   1 mg Oral Daily    [Provider Hold] carvedilol   3.125 mg Oral BID    cefepime  2 g Intravenous Q8H    cyanocobalamin (vitamin B-12)  1,000 mcg Oral Daily    [Provider Hold] empagliflozin  10 mg Oral Daily    flu vac 2025 [id number](PF)  0.5 mL Intramuscular During hospitalization    folic acid   1 mg Oral Daily    lactulose   20 g Enteral tube: gastric 4x Daily    [Provider Hold] magnesium  oxide  400 mg Oral BID    [Provider Hold] melatonin  3 mg Oral QPM    [Provider Hold] multivitamins (ADULT)  1 tablet Oral Daily    pantoprazole (Protonix) intravenous solution  40 mg Intravenous BID    [Provider Hold] polyethylene glycol  17 g Oral BID    [Provider Hold] senna  2 tablet Oral BID    sodium chloride   10 mL Intravenous Q8H    sodium chloride   10 mL Intravenous Q8H sodium chloride   10 mL Intravenous Q8H    sodium chloride   3 mL Intravenous Q8H    thiamine  mononitrate (vit B1)  100 mg Oral Daily    vancomycin  1,000 mg Intravenous Q24H   [3] acetaminophen , docusate sodium, fentaNYL  (PF) **OR** fentaNYL  (PF)

## 2024-03-27 NOTE — Treatment Plan (Signed)
 Vascular Surgery Treatment Plan:   78 y.o. 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 Review of carotid duplex shows normal flow dynamics through the right subclavian artery, appears that the fistula has thrombosed. No vascular surgery intervention is required. No vascular surgery follow up is needed.   Vascular surgery will sign off at this time. Please page the [facility] consult pager with questions or concerns.     [name] Briana Newman, MD  PGY-3 General Surgery

## 2024-03-27 NOTE — Plan of Care (Signed)
 Shift Summary  Vent mode was changed from [facility] to PSV-CPAP, and pressure support remained stable at 10 cm H2O throughout the shift.   Vent supplies were changed once, and equal bilateral breath sounds were maintained with no extubation performed during the shift.   RBC transfusion was administered and then stopped PRN, and cefepime was administered in the afternoon.   Respiratory rate fluctuated, with higher values noted in the late morning and early afternoon.   Overall, mechanical ventilation liberation goals were supported by stable vent settings and airway maintenance, with ongoing respiratory monitoring and interventions.    Mechanical Ventilation Liberation: Vent mode transitioned from PRVC to PSV-CPAP during the shift, and pressure support remained stable at 10 cm H2O; patient was not extubated, and equal bilateral breath sounds were maintained throughout. Respiratory rate fluctuated but generally trended higher in the late morning and early afternoon, and vent supplies were changed once during the shift.

## 2024-03-27 NOTE — Plan of Care (Signed)
 Problem: Mechanical Ventilation Invasive  Goal: Effective Communication  Outcome: Ongoing - Unchanged  Goal: Optimal Device Function  Outcome: Ongoing - Unchanged  Intervention: Optimize Device Care and Function  Recent Flowsheet Documentation  Taken 03/27/2024 0230 by Jolaine Rosaline CROME, RRT  Airway/Ventilation Management: humidification applied  Taken 03/26/2024 2145 by Jolaine Rosaline CROME, RRT  Airway/Ventilation Management: humidification applied  Oral Care: mouth swabbed  Goal: Mechanical Ventilation Liberation  Outcome: Ongoing - Unchanged  Goal: Absence of Device-Related Skin and Tissue Injury  Outcome: Ongoing - Unchanged  Goal: Absence of Ventilator-Induced Lung Injury  Outcome: Ongoing - Unchanged  Intervention: Prevent Ventilator-Associated Pneumonia  Recent Flowsheet Documentation  Taken 03/27/2024 0230 by Jolaine Rosaline CROME, RRT  Head of Bed [facility]) Positioning: HOB at 30-45 degrees  Taken 03/26/2024 2145 by Jolaine Rosaline CROME, RRT  Head of Bed [name]) Positioning: HOB at 30-45 degrees  Oral Care: mouth swabbed

## 2024-03-28 LAB — CBC
HEMATOCRIT: 26.4 % — ABNORMAL LOW (ref 34.0–44.0)
HEMATOCRIT: 26.8 % — ABNORMAL LOW (ref 34.0–44.0)
HEMATOCRIT: 26 % — ABNORMAL LOW (ref 34.0–44.0)
HEMOGLOBIN: 9.1 g/dL — ABNORMAL LOW (ref 11.3–14.9)
HEMOGLOBIN: 9 g/dL — ABNORMAL LOW (ref 11.3–14.9)
HEMOGLOBIN: 9 g/dL — ABNORMAL LOW (ref 11.3–14.9)
MEAN CORPUSCULAR HEMOGLOBIN CONC: 34.3 g/dL (ref 32.0–36.0)
MEAN CORPUSCULAR HEMOGLOBIN CONC: 33.6 g/dL (ref 32.0–36.0)
MEAN CORPUSCULAR HEMOGLOBIN CONC: 34.5 g/dL (ref 32.0–36.0)
MEAN CORPUSCULAR HEMOGLOBIN: 31 pg (ref 25.9–32.4)
MEAN CORPUSCULAR HEMOGLOBIN: 30.5 pg (ref 25.9–32.4)
MEAN CORPUSCULAR HEMOGLOBIN: 31.4 pg (ref 25.9–32.4)
MEAN CORPUSCULAR VOLUME: 90.4 fL (ref 77.6–95.7)
MEAN CORPUSCULAR VOLUME: 90.9 fL (ref 77.6–95.7)
MEAN CORPUSCULAR VOLUME: 91.1 fL (ref 77.6–95.7)
MEAN PLATELET VOLUME: 8.9 fL (ref 6.8–10.7)
MEAN PLATELET VOLUME: 8.9 fL (ref 6.8–10.7)
MEAN PLATELET VOLUME: 9.1 fL (ref 6.8–10.7)
PLATELET COUNT: 49 10*9/L — ABNORMAL LOW (ref 150–450)
PLATELET COUNT: 57 10*9/L — ABNORMAL LOW (ref 150–450)
PLATELET COUNT: 49 10*9/L — ABNORMAL LOW (ref 150–450)
RED BLOOD CELL COUNT: 2.92 10*12/L — ABNORMAL LOW (ref 3.95–5.13)
RED BLOOD CELL COUNT: 2.95 10*12/L — ABNORMAL LOW (ref 3.95–5.13)
RED BLOOD CELL COUNT: 2.86 10*12/L — ABNORMAL LOW (ref 3.95–5.13)
RED CELL DISTRIBUTION WIDTH: 20.4 % — ABNORMAL HIGH (ref 12.2–15.2)
RED CELL DISTRIBUTION WIDTH: 21.1 % — ABNORMAL HIGH (ref 12.2–15.2)
RED CELL DISTRIBUTION WIDTH: 21 % — ABNORMAL HIGH (ref 12.2–15.2)
WBC ADJUSTED: 4.8 10*9/L (ref 3.6–11.2)
WBC ADJUSTED: 4.6 10*9/L (ref 3.6–11.2)
WBC ADJUSTED: 3.6 10*9/L (ref 3.6–11.2)

## 2024-03-28 LAB — APTT
APTT: 31.6 s (ref 24.8–38.4)
APTT: >400 s (ref 24.8–38.4)
APTT: >400 s (ref 24.8–38.4)
HEPARIN CORRELATION: 0.2
HEPARIN CORRELATION: >2.3
HEPARIN CORRELATION: >2.3

## 2024-03-28 LAB — CBC W/ AUTO DIFF
BASOPHILS ABSOLUTE COUNT: 0 10*9/L (ref 0.0–0.1)
BASOPHILS RELATIVE PERCENT: 0.3 %
EOSINOPHILS ABSOLUTE COUNT: 0.1 10*9/L (ref 0.0–0.5)
EOSINOPHILS RELATIVE PERCENT: 2 %
HEMATOCRIT: 25.8 % — ABNORMAL LOW (ref 34.0–44.0)
HEMOGLOBIN: 8.7 g/dL — ABNORMAL LOW (ref 11.3–14.9)
LYMPHOCYTES ABSOLUTE COUNT: 0.9 10*9/L — ABNORMAL LOW (ref 1.1–3.6)
LYMPHOCYTES RELATIVE PERCENT: 20.5 %
MEAN CORPUSCULAR HEMOGLOBIN CONC: 33.6 g/dL (ref 32.0–36.0)
MEAN CORPUSCULAR HEMOGLOBIN: 30.6 pg (ref 25.9–32.4)
MEAN CORPUSCULAR VOLUME: 90.9 fL (ref 77.6–95.7)
MEAN PLATELET VOLUME: 8.7 fL (ref 6.8–10.7)
MONOCYTES ABSOLUTE COUNT: 0.5 10*9/L (ref 0.3–0.8)
MONOCYTES RELATIVE PERCENT: 11.5 %
NEUTROPHILS ABSOLUTE COUNT: 3 10*9/L (ref 1.8–7.8)
NEUTROPHILS RELATIVE PERCENT: 65.7 %
PLATELET COUNT: 55 10*9/L — ABNORMAL LOW (ref 150–450)
RED BLOOD CELL COUNT: 2.84 10*12/L — ABNORMAL LOW (ref 3.95–5.13)
RED CELL DISTRIBUTION WIDTH: 20.8 % — ABNORMAL HIGH (ref 12.2–15.2)
WBC ADJUSTED: 4.5 10*9/L (ref 3.6–11.2)

## 2024-03-28 LAB — HEPATIC FUNCTION PANEL
ALBUMIN: 2.4 g/dL — ABNORMAL LOW (ref 3.4–5.0)
ALKALINE PHOSPHATASE: 70 U/L (ref 46–116)
ALT (SGPT): 27 U/L (ref 10–49)
AST (SGOT): 52 U/L — ABNORMAL HIGH (ref <=34)
BILIRUBIN DIRECT: 1 mg/dL — ABNORMAL HIGH (ref 0.00–0.30)
BILIRUBIN TOTAL: 2.8 mg/dL — ABNORMAL HIGH (ref 0.3–1.2)
PROTEIN TOTAL: 4.4 g/dL — ABNORMAL LOW (ref 5.7–8.2)

## 2024-03-28 LAB — BASIC METABOLIC PANEL
ANION GAP: 10 mmol/L (ref 5–14)
BLOOD UREA NITROGEN: 19 mg/dL (ref 9–23)
BUN / CREAT RATIO: 28
CALCIUM: 9 mg/dL (ref 8.7–10.4)
CHLORIDE: 108 mmol/L — ABNORMAL HIGH (ref 98–107)
CO2: 30 mmol/L (ref 20.0–31.0)
CREATININE: 0.69 mg/dL (ref 0.55–1.02)
EGFR CKD-EPI (2021) FEMALE: 90 mL/min/1.73m2 (ref >=60)
GLUCOSE RANDOM: 116 mg/dL (ref 70–179)
POTASSIUM: 3.7 mmol/L (ref 3.4–4.8)
SODIUM: 148 mmol/L — ABNORMAL HIGH (ref 135–145)

## 2024-03-28 LAB — MAGNESIUM: MAGNESIUM: 2.3 mg/dL (ref 1.6–2.6)

## 2024-03-28 LAB — VANCOMYCIN, RANDOM: VANCOMYCIN RANDOM: 21 ug/mL

## 2024-03-28 LAB — PHOSPHORUS: PHOSPHORUS: 2.4 mg/dL (ref 2.4–5.1)

## 2024-03-28 MED ADMIN — lactulose oral solution: 20 g | GASTROENTERAL | @ 01:00:00

## 2024-03-28 MED ADMIN — lactulose oral solution: 20 g | GASTROENTERAL | @ 12:00:00 | Stop: 2024-03-28

## 2024-03-28 MED ADMIN — pantoprazole (Protonix) injection 40 mg: 40 mg | INTRAVENOUS | @ 01:00:00

## 2024-03-28 MED ADMIN — pantoprazole (Protonix) injection 40 mg: 40 mg | INTRAVENOUS | @ 12:00:00

## 2024-03-28 MED ADMIN — sodium chloride (NS) 0.9 % flush 10 mL: 10 mL | INTRAVENOUS | @ 10:00:00 | Stop: 2024-03-28

## 2024-03-28 MED ADMIN — sodium chloride (NS) 0.9 % flush 10 mL: 10 mL | INTRAVENOUS | @ 02:00:00

## 2024-03-28 MED ADMIN — lactulose oral solution: 40 g | GASTROENTERAL | @ 21:00:00

## 2024-03-28 MED ADMIN — lactulose oral solution: 40 g | GASTROENTERAL | @ 16:00:00

## 2024-03-28 MED ADMIN — folic acid (FOLVITE) tablet 1 mg: 1 mg | ORAL | @ 12:00:00 | Stop: 2024-03-28

## 2024-03-28 MED ADMIN — cefepime (MAXIPIME) 2 g in sodium chloride 0.9 % (NS) 100 mL IVPB-MBP: 2 g | INTRAVENOUS | @ 02:00:00 | Stop: 2024-03-28

## 2024-03-28 MED ADMIN — cefepime (MAXIPIME) 2 g in sodium chloride 0.9 % (NS) 100 mL IVPB-MBP: 2 g | INTRAVENOUS | @ 10:00:00 | Stop: 2024-03-28

## 2024-03-28 MED ADMIN — heparin 25,000 Units/250 mL (100 units/mL) in 0.45% saline infusion (premade): 0-24 [IU]/kg/h | INTRAVENOUS | @ 14:00:00

## 2024-03-28 MED ADMIN — cyanocobalamin (vitamin B-12) tablet 1,000 mcg: 1000 ug | ORAL | @ 12:00:00 | Stop: 2024-03-28

## 2024-03-28 MED ADMIN — thiamine mononitrate (vit B1) tablet 100 mg: 100 mg | ORAL | @ 12:00:00 | Stop: 2024-03-28

## 2024-03-28 MED ADMIN — lactulose enema: 200 g | RECTAL | @ 15:00:00 | Stop: 2024-03-28

## 2024-03-28 MED ADMIN — potassium phosphate 30 mmol in sodium chloride (NS) 0.9 % 500 mL infusion: 30 mmol | INTRAVENOUS | @ 16:00:00 | Stop: 2024-03-28

## 2024-03-28 MED ADMIN — vancomycin (VANCOCIN) IVPB 1000 mg (premix): 1000 mg | INTRAVENOUS | @ 01:00:00 | Stop: 2024-03-30

## 2024-03-28 NOTE — Plan of Care (Signed)
 Patient not following commands, RASS -3. On ventilator. Cough and gag present. Levo stopped. A-fib, afebrile. Foley with adequate output. Heparin gtt started. CHG complete.  Problem: Skin Injury Risk Increased  Goal: Skin Health and Integrity  Outcome: Shift Focus  Intervention: Optimize Skin Protection  Recent Flowsheet Documentation  Taken 03/28/2024 1800 by Vicenta Ned, RN  Activity Management: bedrest  Pressure Reduction Techniques: frequent weight shift encouraged  Pressure Reduction Devices: heel offloading device utilized  Skin Protection: adhesive use limited  Taken 03/28/2024 1600 by Vicenta Ned, RN  Activity Management: bedrest  Pressure Reduction Techniques: frequent weight shift encouraged  Pressure Reduction Devices: heel offloading device utilized  Skin Protection: adhesive use limited  Taken 03/28/2024 1400 by Vicenta Ned, RN  Activity Management: bedrest  Pressure Reduction Techniques: frequent weight shift encouraged  Head of Bed (HOB) Positioning: HOB at 30-45 degrees  Pressure Reduction Devices: heel offloading device utilized  Skin Protection: adhesive use limited  Taken 03/28/2024 1200 by Vicenta Ned, RN  Activity Management: bedrest  Pressure Reduction Techniques: frequent weight shift encouraged  Head of Bed (HOB) Positioning: HOB at 30-45 degrees  Pressure Reduction Devices: heel offloading device utilized  Skin Protection: adhesive use limited  Taken 03/28/2024 1000 by Vicenta Ned, RN  Activity Management: bedrest  Pressure Reduction Techniques: frequent weight shift encouraged  Head of Bed (HOB) Positioning: HOB at 30-45 degrees  Pressure Reduction Devices: heel offloading device utilized  Skin Protection: adhesive use limited  Taken 03/28/2024 0800 by Vicenta Ned, RN  Activity Management: bedrest  Pressure Reduction Techniques: frequent weight shift encouraged  Head of Bed (HOB) Positioning: HOB at 30-45 degrees  Pressure Reduction Devices:   heel offloading device utilized   positioning supports utilized   pressure-redistributing mattress utilized  Skin Protection: adhesive use limited     Problem: Adult Inpatient Plan of Care  Goal: Absence of Hospital-Acquired Illness or Injury  Outcome: Shift Focus  Intervention: Identify and Manage Fall Risk  Recent Flowsheet Documentation  Taken 03/28/2024 1800 by Vicenta Ned, RN  Safety Interventions: aspiration precautions  Taken 03/28/2024 1600 by Vicenta Ned, RN  Safety Interventions: aspiration precautions  Taken 03/28/2024 1400 by Vicenta Ned, RN  Safety Interventions: aspiration precautions  Taken 03/28/2024 1200 by Vicenta Ned, RN  Safety Interventions: aspiration precautions  Taken 03/28/2024 1000 by Vicenta Ned, RN  Safety Interventions: aspiration precautions  Taken 03/28/2024 0800 by Vicenta Ned, RN  Safety Interventions: aspiration precautions  Intervention: Prevent Skin Injury  Recent Flowsheet Documentation  Taken 03/28/2024 1800 by Vicenta Ned, RN  Positioning for Skin: Left  Device Skin Pressure Protection:   absorbent pad utilized/changed   adhesive use limited  Skin Protection: adhesive use limited  Taken 03/28/2024 1600 by Vicenta Ned, RN  Positioning for Skin: Right  Device Skin Pressure Protection:   adhesive use limited   absorbent pad utilized/changed  Skin Protection: adhesive use limited  Taken 03/28/2024 1400 by Vicenta Ned, RN  Positioning for Skin: Left  Device Skin Pressure Protection:   absorbent pad utilized/changed   adhesive use limited  Skin Protection: adhesive use limited  Taken 03/28/2024 1200 by Vicenta Ned, RN  Positioning for Skin: Right  Device Skin Pressure Protection:   adhesive use limited   absorbent pad utilized/changed  Skin Protection: adhesive use limited  Taken 03/28/2024 1000 by Vicenta Ned, RN  Positioning for Skin: Left  Device Skin Pressure Protection:   absorbent pad utilized/changed   adhesive use limited  Skin Protection: adhesive use limited  Taken 03/28/2024 0800  by Vicenta Ned, RN  Positioning for Skin: Right  Device Skin Pressure Protection:   adhesive use limited   absorbent pad utilized/changed  Skin Protection: adhesive use limited  Intervention: Prevent Infection  Recent Flowsheet Documentation  Taken 03/28/2024 1800 by Vicenta Ned, RN  Infection Prevention: cohorting utilized  Taken 03/28/2024 1600 by Vicenta Ned, RN  Infection Prevention: cohorting utilized  Taken 03/28/2024 1400 by Vicenta Ned, RN  Infection Prevention: cohorting utilized  Taken 03/28/2024 1200 by Vicenta Ned, RN  Infection Prevention: cohorting utilized  Taken 03/28/2024 1000 by Vicenta Ned, RN  Infection Prevention:   cohorting utilized   equipment surfaces disinfected  Taken 03/28/2024 0800 by Vicenta Ned, RN  Infection Prevention: equipment surfaces disinfected  Goal: Optimal Comfort and Wellbeing  Outcome: Shift Focus

## 2024-03-28 NOTE — Plan of Care (Signed)
 Pt tol psv well, needs ABG  Problem: Mechanical Ventilation Invasive  Goal: Mechanical Ventilation Liberation  Outcome: Progressing     Problem: Breathing Pattern Ineffective  Goal: Effective Breathing Pattern  Intervention: Promote Improved Breathing Pattern  Recent Flowsheet Documentation  Taken 03/28/2024 0422 by Ann Katheryn BROCKS, RRT  Airway/Ventilation Management:   airway patency maintained   pulmonary hygiene promoted  Taken 03/27/2024 2030 by Ann Katheryn BROCKS, RRT  Airway/Ventilation Management: pulmonary hygiene promoted     Problem: Gas Exchange Impaired  Goal: Optimal Gas Exchange  Intervention: Optimize Oxygenation and Ventilation  Recent Flowsheet Documentation  Taken 03/28/2024 0422 by Ann Katheryn BROCKS, RRT  Airway/Ventilation Management:   airway patency maintained   pulmonary hygiene promoted  Taken 03/27/2024 2030 by Ann Katheryn BROCKS, RRT  Airway/Ventilation Management: pulmonary hygiene promoted     Problem: Mechanical Ventilation Invasive  Goal: Optimal Device Function  Intervention: Optimize Device Care and Function  Recent Flowsheet Documentation  Taken 03/28/2024 0422 by Ann Katheryn BROCKS, RRT  Airway/Ventilation Management:   airway patency maintained   pulmonary hygiene promoted  Taken 03/27/2024 2030 by Ann Katheryn BROCKS, RRT  Airway/Ventilation Management: pulmonary hygiene promoted  Oral Care:   suction provided   teeth brushed   tongue brushed   oral rinse provided   mouth swabbed  Goal: Absence of Ventilator-Induced Lung Injury  Intervention: Prevent Ventilator-Associated Pneumonia  Recent Flowsheet Documentation  Taken 03/28/2024 0422 by Ann Katheryn C, RRT  VAP Prevention Bundle:   readiness to extubate assessed   spontaneous breathing trial performed  Taken 03/27/2024 2030 by Ann Katheryn BROCKS, RRT  VAP Prevention Bundle: oral care regularly provided  Oral Care:   suction provided   teeth brushed   tongue brushed   oral rinse provided   mouth swabbed     Problem: Mechanical Ventilation Invasive  Goal: Optimal Device Function  Intervention: Optimize Device Care and Function  Recent Flowsheet Documentation  Taken 03/28/2024 0422 by Ann Katheryn BROCKS, RRT  Airway/Ventilation Management:   airway patency maintained   pulmonary hygiene promoted  Taken 03/27/2024 2030 by Ann Katheryn BROCKS, RRT  Airway/Ventilation Management: pulmonary hygiene promoted  Oral Care:   suction provided   teeth brushed   tongue brushed   oral rinse provided   mouth swabbed  Goal: Absence of Ventilator-Induced Lung Injury  Intervention: Prevent Ventilator-Associated Pneumonia  Recent Flowsheet Documentation  Taken 03/28/2024 0422 by Ann Katheryn C, RRT  VAP Prevention Bundle:   readiness to extubate assessed   spontaneous breathing trial performed  Taken 03/27/2024 2030 by Ann Katheryn BROCKS, RRT  VAP Prevention Bundle: oral care regularly provided  Oral Care:   suction provided   teeth brushed   tongue brushed   oral rinse provided   mouth swabbed     Problem: Mechanical Ventilation Invasive  Goal: Optimal Device Function  Intervention: Optimize Device Care and Function  Recent Flowsheet Documentation  Taken 03/28/2024 0422 by Ann Katheryn BROCKS, RRT  Airway/Ventilation Management:   airway patency maintained   pulmonary hygiene promoted  Taken 03/27/2024 2030 by Ann Katheryn BROCKS, RRT  Airway/Ventilation Management: pulmonary hygiene promoted  Oral Care:   suction provided   teeth brushed   tongue brushed   oral rinse provided   mouth swabbed  Goal: Absence of Ventilator-Induced Lung Injury  Intervention: Prevent Ventilator-Associated Pneumonia  Recent Flowsheet Documentation  Taken 03/28/2024 0422 by Ann Katheryn C, RRT  VAP Prevention Bundle:   readiness to extubate assessed   spontaneous breathing trial performed  Taken 03/27/2024 2030 by Ann Mar C, RRT  VAP Prevention Bundle: oral care regularly provided  Oral Care:   suction provided   teeth brushed   tongue brushed   oral rinse provided   mouth swabbed     Problem: Mechanical Ventilation Invasive  Goal: Optimal Device Function  Intervention: Optimize Device Care and Function  Recent Flowsheet Documentation  Taken 03/28/2024 0422 by Ann Mar BROCKS, RRT  Airway/Ventilation Management:   airway patency maintained   pulmonary hygiene promoted  Taken 03/27/2024 2030 by Ann Mar BROCKS, RRT  Airway/Ventilation Management: pulmonary hygiene promoted  Oral Care:   suction provided   teeth brushed   tongue brushed   oral rinse provided   mouth swabbed  Goal: Absence of Ventilator-Induced Lung Injury  Intervention: Prevent Ventilator-Associated Pneumonia  Recent Flowsheet Documentation  Taken 03/28/2024 0422 by Ann Mar C, RRT  VAP Prevention Bundle:   readiness to extubate assessed   spontaneous breathing trial performed  Taken 03/27/2024 2030 by Ann Mar BROCKS, RRT  VAP Prevention Bundle: oral care regularly provided  Oral Care:   suction provided   teeth brushed   tongue brushed   oral rinse provided   mouth swabbed

## 2024-03-28 NOTE — Plan of Care (Signed)
 Problem: Mechanical Ventilation Invasive  Goal: Effective Communication  Outcome: Progressing  Goal: Optimal Device Function  Outcome: Progressing  Intervention: Optimize Device Care and Function  Recent Flowsheet Documentation  Taken 03/28/2024 [id number] by Dewight Counts D, RRT  Oral Care:   teeth brushed   tongue brushed   suction provided   mouth swabbed  Goal: Mechanical Ventilation Liberation  Outcome: Progressing  Goal: Optimal Nutrition Delivery  Outcome: Progressing  Goal: Absence of Device-Related Skin and Tissue Injury  Outcome: Progressing  Goal: Absence of Ventilator-Induced Lung Injury  Outcome: Progressing  Intervention: Prevent Ventilator-Associated Pneumonia  Recent Flowsheet Documentation  Taken 03/28/2024 0814 by Dewight Counts D, RRT  Oral Care:   teeth brushed   tongue brushed   suction provided   mouth swabbed     Problem: Mechanical Ventilation Invasive  Goal: Optimal Device Function  Intervention: Optimize Device Care and Function  Recent Flowsheet Documentation  Taken 03/28/2024 0814 by Dewight Counts D, RRT  Oral Care:   teeth brushed   tongue brushed   suction provided   mouth swabbed  Goal: Absence of Ventilator-Induced Lung Injury  Intervention: Prevent Ventilator-Associated Pneumonia  Recent Flowsheet Documentation  Taken 03/28/2024 0814 by Dewight Counts D, RRT  Oral Care:   teeth brushed   tongue brushed   suction provided   mouth swabbed     Problem: Mechanical Ventilation Invasive  Goal: Optimal Device Function  Intervention: Optimize Device Care and Function  Recent Flowsheet Documentation  Taken 03/28/2024 0814 by Dewight Counts D, RRT  Oral Care:   teeth brushed   tongue brushed   suction provided   mouth swabbed  Goal: Absence of Ventilator-Induced Lung Injury  Intervention: Prevent Ventilator-Associated Pneumonia  Recent Flowsheet Documentation  Taken 03/28/2024 0814 by Dewight Counts D, RRT  Oral Care:   teeth brushed   tongue brushed   suction provided   mouth swabbed Problem: Mechanical Ventilation Invasive  Goal: Optimal Device Function  Intervention: Optimize Device Care and Function  Recent Flowsheet Documentation  Taken 03/28/2024 9185 by Dewight Counts D, RRT  Oral Care:   teeth brushed   tongue brushed   suction provided   mouth swabbed  Goal: Absence of Ventilator-Induced Lung Injury  Intervention: Prevent Ventilator-Associated Pneumonia  Recent Flowsheet Documentation  Taken 03/28/2024 0814 by Dewight Counts D, RRT  Oral Care:   teeth brushed   tongue brushed   suction provided   mouth swabbed

## 2024-03-28 NOTE — Consults (Signed)
 OCCUPATIONAL THERAPY  Re-Evaluation (03/28/24 1046)    Patient Name:  [name]       Medical Record Number: [id number]     Date of Birth: June 13, 1945  Sex: Female      Post-Discharge Occupational Therapy Recommendations: 5x weekly, Low intensity          Equipment Recommendation  OT DME Recommendations: Defer to post acute       OT Treatment Diagnosis: Generalized muscle weakness, Limitation of activities due to disability, Need for assistance with personal care, Reduced mobility, Unsteadiness on feet         Assessment  Assessment: Pt seen for skilled acute OT Re-evaluation 2/2 admission to MICU for intubation and pressor support. Pt w/ minimal arousability and only following ~10% of commands. Attempted to sit Pt upright but noted poor tolerance with deficits in HR and episode of emesis. Completed grooming task of washing face and styling hair. Total A. Pt's POC updated to reflect current functional status    Problem List: Decreased cognition, Impaired judgement, Decreased strength, Decreased coordination, Decreased activity tolerance, Impaired balance, Decreased skin integrity, Decreased endurance, Decreased mobility, Gait deviation, Fall risk, Impaired ADLs  Personal Factors/Comorbidities (Occupational Profile and History Review): Extensive (High)  Assessment of Occupational Performance : Balance, Endurance, Mobility, Strength, Cognitive skills  Clinical Decision Making: High Complexity    Today's Interventions: ADL retraining, Balance activities, Compensatory tech. training, Conservation, Education - Patient, Endurance activities, Functional mobility, Range of motion, Positioning, Transfer training, Safety education, Bed mobility, Functional cognition       Activity Tolerance During Today's Session  Limited secondary to medical complications    Plan  Planned Frequency of Treatment: Plan of Care Initiated: 03/28/24  1-2x per day Weekly Frequency: 2-3 days per week  Planned Treatment Duration: 04/18/24    Planned Interventions:  Clinical Research Associate, Education (Patient/Family/Caregiver), Self-Care/Home Training, Therapeutic Exercise, Therapeutic Activity, Neuromuscular Re-education      GOALS:   Patient and Family Goals: to go home    Short Term:   SHORT GOAL #1: Pt will complete EOB/OOB assessment   Time Frame : 2 weeks  SHORT GOAL #2: Pt will follow 1 step commands w/ 50% accuracy   Time Frame : 2 weeks  SHORT GOAL #3: Pt will perform full body dressing with mod I + LRAD/compensatory strategies   Time Frame : 2 weeks           Long Term Goal #1: Pt will score 18+/24 on AMPAC  Time Frame: 4 weeks    Prognosis:  Good  Positive Indicators:  PLOF  Barriers to Discharge: Inaccessible home environment, Impaired Balance, Decreased safety awareness, Endurance deficits, Cognitive deficits, Inability to safely perform ADLS, Functional strength deficits, Decreased range of motion, Severity of deficits    Subjective  Medical Updates Since Last Visit/Relevant PMH Affecting Clinical Decision Making:    Prior Functional Status PTA, pt indep with ADLs. Denied use of DME at baseline (per EMR uses RW/rollator). Lives with daughter, granddaughter, and husband. (-) drives.    Living Situation  Living Environment: House  Lives With: Spouse, Daughter, Family  Home Living: Multi-level home, Walk-in shower, Raised toilet seat without rails, Stairs to enter with rails, Stairs to alternate level with rails, Built-in shower seat  Rail placement (outside): Bilateral rails in reach  Number of Stairs to Enter (outside): 9  Rail placement (inside): Bilateral rails in reach  Number of Stairs to Alternate level (inside):  (need clarification)  Caregiver Identified?: Yes  Caregiver Availability:  24 hours  Caregiver Ability: Supervision     Equipment available at home: Rollator, Rolling walker            Patient / Caregiver reports: RN agreeable to OT session      Past Medical History[1] Social History     Tobacco Use    Smoking status: Never     Passive exposure: Past    Smokeless tobacco: Never   Substance Use Topics    Alcohol use: Not Currently      Past Surgical History[2] Family History[3]     Sulfa (sulfonamide antibiotics), Sulfur , and Aspirin     Objective Findings  Precautions / Restrictions  Falls precautions, Delirium Precautions       Weight Bearing  Non-applicable    Required Braces or Orthoses  Non-applicable    Communication Preference  Verbal       Pain  Pt unable to report pain this date    Equipment / Environment  Vascular access (PIV, TLC, Port-a-cath, PICC), Telemetry, Ventilatory support, Foley, NGT         Cognition   Orientation Level:  Unable to test   Arousal/Alertness:  Unable to assess   Attention Span:  Unable to assess   Memory:  Unable to assess   Following Commands:   (UTA)   Safety Judgment:  Unable to assess   Awareness of Errors and Problem Solving:  Unable to assess    Vision / Hearing      Vision Comments: UTA      Hearing: UTA     Hand Function:  Right Hand Function: Right hand function impaired  Left Hand Function: Left hand function impaired  Hand Function comments: full PROM  Hand Dominance: Right    Skin Inspection:  Skin Inspection: Redness, Bruising, Skin tear      ROM / Strength:  UE ROM/Strength: Left Impaired/Limited, Right Impaired/Limited  UE ROM/ Strength Comment: Full PROM  LE ROM/Strength: Left Impaired/Limited, Right Impaired/Limited  LE ROM/ Strength Comment: Full PROM    Coordination:  Coordination comment: UTA    Sensation:  Sensory/ Proprioception/ Stereognosis comments: Minimal response to noxious stimuli    Balance:  Static Sitting-Level of Assistance: Unable to assess  Dynamic Sitting-Level of Assistance: Unable to assess  Sitting Balance comments: Attempted to raise HOB; Pt w/ episode of Emesis    Static Standing-Level of Assistance: Unable to assess  Dynamic Standing - Level of Assistance: Unable to assess    Functional Mobility  Transfers:  (UTA)  Bed Mobility - Needs Assistance: Total assist, Physical assistance required, +2 assist  Ambulation: NT    ADLs  Feeding : Total Assist  Grooming: Total Assist  Bathing: Total Assist  Toileting: Total Assist  UB Dressing: Total Assist  LB Dressing: Independent  IADLs: NT    Vitals / Orthostatics  Vitals/Orthostatics: Pt in afib and pvc's throughout session    Patient at end of session: All needs in reach, In bed, Lines intact, Notified Nurse     Occupational Therapy Session Duration  OT Individual [mins]: 23       AM-PAC-Daily Activity  Lower Body Dressing assistance needs: Unable to do/total assistance - Total Dependent Assist  Bathing assistance needs: Unable to do/total assistance - Total Dependent Assist  Toileting assistance needs: Unable to do/total assistance - Total Dependent Assist  Upper Body Dressing assistance needs: Unable to do/total assistance - Total Dependent Assist  Personal Grooming assistance needs: Unable to do/total assistance - Total Dependent Assist  Eating Meals  assistance needs: Unable to do/total assistance - Total Dependent Assist    Daily Activity Score: 6    Score (in points): % of Functional Impairment, Limitation, Restriction  6: 100% impaired, limited, restricted  7-8: At least 80%, but less than 100% impaired, limited restricted  9-13: At least 60%, but less than 80% impaired, limited restricted  14-19: At least 40%, but less than 60% impaired, limited restricted  20-22: At least 20%, but less than 40% impaired, limited restricted  23: At least 1%, but less than 20% impaired, limited restricted  24: 0% impaired, limited restricted      I attest that I have reviewed the above information.  Signed: Delon Faith, OT  Filed 03/28/2024                 [1]   Past Medical History:  Diagnosis Date    A-fib (CMS-HCC)     Alcoholism    (CMS-HCC)     Alcoholism /alcohol abuse     Cirrhosis    (CMS-HCC)     Depression     Hypertension     Liver disease     Malignant neoplasm of overlapping sites of right breast in female, estrogen receptor positive    (CMS-HCC) 10/03/2020   [2]   Past Surgical History:  Procedure Laterality Date    BREAST BIOPSY Right     benign-a long time ago    BREAST BIOPSY Right 07/2020    malignant    BREAST LUMPECTOMY Right     4 2022    CENTRAL LINE  03/25/2024    CHOLECYSTECTOMY      PR BX/REMV,LYMPH NODE,DEEP AXILL Right 09/03/2020    Procedure: BX/EXC LYMPH NODE; OPEN, DEEP AXILRY NODE;  Surgeon: Alm Elsie Como, MD;  [facility];  Service: Surgical Oncology Breast    PR CARDIOVERSION, ELECTIVE;EXTERN N/A 03/10/2024    Procedure: CARDIOVERSION, ELECTIVE, ELECTRICAL CONVERSION OF ARRHYTHMIA; EXTERNAL;  Surgeon: Sedalia Velma Hamilton, MD;  Location: [facility] OR [facility];  Service: Cardiology    PR ECHO HEART,TRANSESOPHAGEAL,COMPLETE Midline 03/10/2024    Procedure: ECHOCARDIOGRAPHY, TRANSESOPHAGEAL, REAL-TIME WITH IMAGE DOCUMENTATION;  Surgeon: Sedalia Velma Hamilton, MD;  Location: [facility] OR [facility];  Service: Cardiology    PR INTRAOPERATIVE SENTINEL LYMPH NODE ID W DYE INJECTION Right 09/03/2020    Procedure: INTRAOPERATIVE IDENTIFICATION SENTINEL LYMPH NODE(S) INCLUDE INJECTION NON-RADIOACTIVE DYE, WHEN PERFORMED;  Surgeon: Alm Elsie Como, MD;  Location: [facility];  Service: Surgical Oncology Breast    PR MASTECTOMY, PARTIAL Right 09/03/2020    Procedure: MASTECTOMY, PARTIAL (EG, LUMPECTOMY, TYLECTOMY, QUADRANTECTOMY, SEGMENTECTOMY);  Surgeon: Alm Elsie Como, MD;  Location: [facility] OR [facility];  Service: Surgical Oncology Breast    PR UPPER GI ENDOSCOPY,BIOPSY N/A 02/03/2018    Procedure: UGI ENDOSCOPY; WITH BIOPSY, SINGLE OR MULTIPLE;  Surgeon: Eleanor Dewey Sorrel, MD;  Location: [facility];  Service: Gastroenterology    RADIATION Right     unsure finish date    TUBAL LIGATION     [3]   Family History  Problem Relation Age of Onset    Heart disease Mother     No Known Problems Father     No Known Problems Sister     No Known Problems Daughter     No Known Problems Maternal Grandmother     No Known Problems Maternal Grandfather     No Known Problems Paternal Grandmother     No Known Problems Paternal Grandfather     Lupus  Brother     No Known Problems Other     BRCA 1/2 Neg Hx     Breast cancer Neg Hx     Cancer Neg Hx     Colon cancer Neg Hx     Endometrial cancer Neg Hx     Ovarian cancer Neg Hx     Mental illness Neg Hx     Substance Abuse Disorder Neg Hx

## 2024-03-28 NOTE — Progress Notes (Signed)
 MICU Daily Progress Note     Date of Service: 03/28/2024    Problem List:   Principal Problem:    Bacteremia, escherichia coli  Active Problems:    Alcoholic liver disease (HHS-HCC)    HTN (hypertension)    Depression with anxiety    Class 2 severe obesity with serious comorbidity in adult    Atrial fibrillation    (CMS-HCC)    Pleural effusion    Hypernatremia    Thrombocytopenia    Cirrhosis    (CMS-HCC)    A-fib (CMS-HCC)    Hepatic encephalopathy    (CMS-HCC)      Interval history: [name] is a 78 y.o. female with HFpEF, HTN, Afib on AC, MASLD cirrhosis, originally hospitalized for scheduled TEE/DCCV (aborted d/t LAA clot) c/b AHHRF, encephalopathy and septic shock requiring MICU care, now s/p extubation transferred to [facility] for further management of anticoagulation and hypernatremia. Transferred to [facility] MICU for closer monitoring for respiratory status, pressor requirement, GI bleed, and management of carotid artery injury.     On 10/24, patient had acute worsening of respiratory status leading requiring intubation. When patient was given sedation for intubation, she coded and promptly had ROSC after a few minutes of CPR. Subsequently, CVC placed in neck, used to draw blood, unclear if given pressors through it, then found  to be in arterial system based on blood gas and imaging, likely right carotid. Line was subsequently removed, and pressure was held for 15 minutes, hemostasis with no hematoma. She was transferred to [facility] MICU for management of this carotid injury, to be evaluated by vascular surgery while also managing her increased vasopressor requirement and respiratory distress (2 pressure shock and intubated on arrival).    24 hour events:  - Carotid/Internal jugular fistula resolved spontaneously per vascular  - s/p 3 units PRBC  - GI evaluated and said unlikely to be active bleed  - Weaned off NE    Neurological   Analgosedation  Altered mental status with history of hepatic encephalopathy  Per chart review at [facility], rapid response called on 10/24 for increased somnolence. At the time, differential included hepatic encephalopathy, toxic metabolic encephalopathy in the setting of shock (possibly sepsis), versus intracranial pathology. She was subsequently intubated. CT head on 10/24 showed no acute intracranial abnormality. On 30mcg/min of propofol  on arrival.   - Continue Propofol , wean as tolerated to assess neurological status  - Goal RASS 0 to -1  - Continue lactulose     Analgesia: No pain issues  RASS at goal? Yes  [place] Agitation Assessment Scale (RASS) : -2 (03/28/2024  6:00 AM)    Pulmonary   Acute Hypoxic Hypercarbic Respiratory Failure c/f septic shock   Rapid response called on 10/24 for increased somnolence, found to have acute hypercarbic respiratory failure and subsequently intubated. Suspect hypercarbic respiratory failure due to encephalopathy as above. CXR on 10/24 showed worsening pleural effusions. Not currently getting diuresed especially in the setting of 2 pressor shock. Given patient's increased pressor requirement, hypoxia, and AMS, concern for infectious etiology. MRSA nares negative.  - Discontinue vanc  - SBT today, plan to extubate when passes  - LRCx, Bcx pending  - follow ABG, lactate, CBC  - continue cefepime     Vent Mode: PSV-CPAP  S RR:  [12-15] 15  FiO2 (%):  [40 %] 40 %  S VT:  [340 mL] 340 mL  PC Set:  [15] 15  PR SUP:  [5 cm H20-10 cm H20] 5 cm  H20  O2 Device: Ventilator    Cardiovascular   Carotid Artery Injury (resolved)  At [facility], CVC placed intended for RIJ on 10/24 following intubation, PEA, ROSC. Was used with known blood return. Unclear if it was used for pressor support, but highly likely that it was. CXR for placement check showed concern for intra aterial location, and blood gas confirmed it was arterial. Thought to be in carotid artery. CVC removed with pressure held for 15 minutes, with no hematoma formation. On arrival, no physical exam and bedside ultrasound showed no large hematoma. No active bleeding.   - Vascular Surgery consulted, stating fistula has healed based on Ultrasound findings    PEA arrest s/p ROSC  Hypovolemic/Hemorrhagic shock (resolved)  Atrial fibrillation  known left atrial appendage clot  HFpEF  Patient with PEA arrest peri-intubation with ROSC. Shock thought to be distributive versus hemorrhagic in the setting of GI bleed History of atrial fibrillation with known LAA clot and HFpEF. Vasopressors were weaned off 10/27.  - Hold Coreg , eliquis, GDMT given hemodynamic instability, consider restarting once more clinically stable  - Remains intermittently NE  - Resume anticoagulation with heparin infusion for LAA clot    Renal   Hypernatremia, resolving  At [facility], had persistently hypernatremia near 153. Had been treated with D5 gtt. Sodium throughout admission has largely been 146-148.  - Strict I/O's  - Sodium check q6hours   - FWF with TF     Infectious Disease/Autoimmune   Rapid response called 10/24, patient noted to be hypothermic. WBCs jumped from 3.0 to 9.7 on 10/24. Although the most recent lab was following intubation and CPR. UA with pyuria, rare bacteria, hematuria. Peri-intubation, patient developed significant hypotension requiring initiation of NE and vasopressin. Suspect possible septic shock with unknown infectious source. Patient started on vancomycin and cefepime 10/24. CT Abdomen pelvis concerning for aspiration pneumonia. Of note, earlier in hospitalization had GNR bacteremia thought to have urinary source  - stopping cefepime; stopped Vanc  - follow blood, urine, lower resp culture    Cultures:  Blood Culture, Routine (no units)   Date Value   03/25/2024 No Growth at 48 hours   03/25/2024 No Growth at 48 hours     Urine Culture, Comprehensive (no units)   Date Value   03/25/2024 <10,000 CFU/mL     WBC (10*9/L)   Date Value   03/28/2024 4.5     WBC, UA (/HPF)   Date Value   03/25/2024 25 (H)       FEN/GI Sigmoid Mass c/f Malignancy and Hematochezia  Hemorrhagic Shock  CT abdomen pelvis done 10/24 with evidence of cirrhosis and masslike thickening of the lower sigmoid colon concerning for malignancy.  CTA abdomen pelvis performed 1024 with active extravasation into the gastric fundus possibly secondary to traumatic enteric tube placement.  GI scoped the patient on 10/25 and clipped an area of active bleeding.  They removed a total of 1.5 L of blood.  Hemoglobin 10/20 6 AM downtrended to 6.0, so 2 units of blood transfused.  GI stated this was consistent with 10/25 scope and likely did not represent increased bleeding.  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 Given shock of unclear etiology. CTAP demonstrating cirrhosis. Likely that cirrhosis is  contributing to hypotension.   - Continue lactulose ; increasing to to target 3-5 BMs daily  - NGT in place  - Daily MELD labs and CMP  - clotting factors     MELD 3.0: 14 at 03/27/2024  5:19 AM  MELD-Na: 11 at 03/27/2024  5:19 AM  Calculated from:  Serum Creatinine: 0.99 mg/dL (Using min of 1 mg/dL) at 89/73/7974  4:80 AM  Serum Sodium: 146 mmol/L (Using max of 137 mmol/L) at 03/27/2024  5:19 AM  Total Bilirubin: 2.2 mg/dL at 89/73/7974  4:80 AM  Serum Albumin: 2.8 g/dL at 89/73/7974  4:80 AM  INR(ratio): 1.19 at 03/25/2024 10:27 PM  Age at listing (hypothetical): 78 years  Sex: Female at 03/27/2024  5:19 AM    Provider Malnutrition Assessment:  Body mass index is 34.8 kg/m??.BMI Interpretation: >/= 30 and < 40, consistent with obesity, clinically significant requiring additional resources and complicating multiple aspects of patient care.  GLIM criteria:   Pt does not meet criteria  -I have screened this patient for malnutrition and they did NOT meet criteria for malnutrition based on GLIM criteria.  -Starting TF and FWF; Dietician consulted, appreciate assistance    Heme/Coag   Acute blood loss anemia  Hemoglobin down trended to 6.0 on the morning of 10/26 thought to be secondary to acute GI bleed that was scoped and clipped 10/25. Patient received 2 units PRBC 10/26 and an additional unit 10/27.  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 Reason: Not indicated.    Prophylaxis/LDA/Restraints/Consults   ICU Checklist completed: yes (see ICU rounding navigator in Epic)    Patient Lines/Drains/Airways Status       Active Active Lines, Drains, & Airways       Name Placement date Placement time Site Days    ETT  7.5 03/25/24  1317  -- 2    CVC Triple Lumen 03/25/24 Non-tunneled Left Internal jugular 03/25/24  2316  Internal jugular  2    NG/OG Tube 18 Fr. Center mouth 03/25/24  1800  Center mouth  2    Urethral Catheter Temperature probe;Non-latex 03/25/24  1354  Temperature probe;Non-latex  2    Peripheral IV 03/20/24 Anterior;Left;Upper Arm 03/20/24  1730  Arm  7    Peripheral IV 03/25/24 Left;Posterior Hand 03/25/24  1950  Hand  2                  Patient Lines/Drains/Airways Status       Active Wounds       Name Placement date Placement time Site Days    Wound 03/01/24 Other (Comment) Toe (Comment which one) Left;Other (Comment) Blister present on arrival, tx already in outpatient setting, in process of healing, surrounding erythema 03/01/24  0129  Toe (Comment which one)  27    Wound 03/13/24 Irritant Contact Dermatitis Incontinence Sacrum Mid gluteal cleft MASD? 03/13/24  --  Sacrum  15                  Goals of Care     Code Status:   Orders Placed This Encounter   Procedures    Full Code     Standing Status: Standing     Number of Occurrences:   1  Designated Chiropodist Maker:  Ms. Burgen designated healthcare decision maker(s) is/are   HCDM (patient stated preference): Grainger,[name] - Spouse - [phone]    HCDM, First AlternateLeandra, [place] - Daughter - [phone]. See HCDM section of Epic sidebar/storyboard or ACP tab in patient chart for details regarding active HCDMs and patient capacity for decision-making.      Subjective     Patient is resting comfortably in bed intubated on the ventilator.  She is alert and following commands.    Objective     Vitals - past 24 hours  Pulse:  [63-98] 67  SpO2 Pulse:  [64-99] 64  Resp:  [10-22] 15  BP: (81-141)/(40-97) 107/61  FiO2 (%):  [40 %] 40 %  SpO2:  [100 %] 100 % Intake/Output  I/O last 3 completed shifts:  In: 2721.8 [I.V.:1341.8; Blood:700; NG/GT:170; IV Piggyback:510]  Out: 1005 [Urine:1005]     Physical Exam:    General: intubated, alert  HEENT: normocephalic, atraumatic   CV: pulses palpable, RRR   Pulm: intubated, equal expansion  GI: soft, mildly distended  MSK: No lower extremity edema, no clubbing or cyanosis  Skin: numerous large flat violaceous hematomas on left arm, chest, neck bilaterally.   Neuro: Spontaneously moving extremities and following commands    Continuous Infusions:   Infusions Meds[1]    Scheduled Medications:   Scheduled Medications[2]    PRN medications:  PRN Medications[3]    Data/Imaging Review: Reviewed in Epic and personally interpreted on 03/28/2024. See EMR for detailed results.    Prentice Staples, MD  [facility] Emergency Medicine, PGY-1              [1]    NORepinephrine bitartrate-NS 0 mcg/min (03/28/24 0009)    octreotide infusion Stopped (03/28/24 0009)   [2]    [Provider Hold] anastrozole   1 mg Oral Daily    [Provider Hold] carvedilol   3.125 mg Oral BID    cefepime  2 g Intravenous Q8H    cyanocobalamin (vitamin B-12)  1,000 mcg Oral Daily    [Provider Hold] empagliflozin  10 mg Oral Daily    flu vac 2025 [id number](PF)  0.5 mL Intramuscular During hospitalization    folic acid   1 mg Oral Daily    lactulose   20 g Enteral tube: gastric 4x Daily    [Provider Hold] magnesium  oxide  400 mg Oral BID    [Provider Hold] melatonin  3 mg Oral QPM    [Provider Hold] multivitamins (ADULT)  1 tablet Oral Daily    pantoprazole (Protonix) intravenous solution  40 mg Intravenous BID    [Provider Hold] polyethylene glycol  17 g Oral BID    [Provider Hold] senna  2 tablet Oral BID    sodium chloride   10 mL Intravenous Q8H    sodium chloride   10 mL Intravenous Q8H    sodium chloride   10 mL Intravenous Q8H    sodium chloride   3 mL Intravenous Q8H    thiamine  mononitrate (vit B1)  100 mg Oral Daily    vancomycin  1,000 mg Intravenous Q24H   [3] acetaminophen , docusate sodium, fentaNYL  (PF) **OR** fentaNYL  (PF)

## 2024-03-29 LAB — APTT
APTT: >400 s (ref 24.8–38.4)
APTT: 133.2 s — ABNORMAL HIGH (ref 24.8–38.4)
HEPARIN CORRELATION: >2.3
HEPARIN CORRELATION: 0.8

## 2024-03-29 LAB — BLOOD GAS CRITICAL CARE PANEL, VENOUS
BASE EXCESS VENOUS: 3.8 — ABNORMAL HIGH (ref -2.0–2.0)
CALCIUM IONIZED VENOUS (MG/DL): 5.38 mg/dL (ref 4.40–5.40)
CARBOXYHEMOGLOBIN, VENOUS: 2 % — ABNORMAL HIGH (ref <1.2)
CHLORIDE, WHOLE BLOOD: 114 mmol/L — ABNORMAL HIGH (ref 98–107)
GLUCOSE WHOLE BLOOD: 107 mg/dL (ref 70–179)
HCO3 VENOUS: 29 mmol/L — ABNORMAL HIGH (ref 22–27)
HEMOGLOBIN BLOOD GAS: 11 g/dL — ABNORMAL LOW (ref 12.00–16.00)
LACTATE BLOOD VENOUS: 1.5 mmol/L (ref 0.5–1.8)
METHEMOGLOBIN, VENOUS: <1 % (ref <1.5)
O2 SATURATION VENOUS: 59.4 % (ref 40.0–85.0)
OXYHEMOGLOBIN, VENOUS: 58 % (ref 40.0–85.0)
PCO2 VENOUS: 54 mmHg (ref 40–60)
PH VENOUS: 7.35 (ref 7.32–7.43)
PO2 VENOUS: 37 mmHg (ref 30–55)
POTASSIUM WHOLE BLOOD: 3.8 mmol/L (ref 3.4–4.6)
SODIUM WHOLE BLOOD: 148 mmol/L — ABNORMAL HIGH (ref 135–145)

## 2024-03-29 LAB — CBC W/ AUTO DIFF
BASOPHILS ABSOLUTE COUNT: 0 10*9/L (ref 0.0–0.1)
BASOPHILS RELATIVE PERCENT: 0.3 %
EOSINOPHILS ABSOLUTE COUNT: 0.2 10*9/L (ref 0.0–0.5)
EOSINOPHILS RELATIVE PERCENT: 4 %
HEMATOCRIT: 27.2 % — ABNORMAL LOW (ref 34.0–44.0)
HEMOGLOBIN: 9.2 g/dL — ABNORMAL LOW (ref 11.3–14.9)
LYMPHOCYTES ABSOLUTE COUNT: 0.9 10*9/L — ABNORMAL LOW (ref 1.1–3.6)
LYMPHOCYTES RELATIVE PERCENT: 21.5 %
MEAN CORPUSCULAR HEMOGLOBIN CONC: 33.8 g/dL (ref 32.0–36.0)
MEAN CORPUSCULAR HEMOGLOBIN: 31.1 pg (ref 25.9–32.4)
MEAN CORPUSCULAR VOLUME: 92.1 fL (ref 77.6–95.7)
MEAN PLATELET VOLUME: 9.2 fL (ref 6.8–10.7)
MONOCYTES ABSOLUTE COUNT: 0.6 10*9/L (ref 0.3–0.8)
MONOCYTES RELATIVE PERCENT: 15 %
NEUTROPHILS ABSOLUTE COUNT: 2.4 10*9/L (ref 1.8–7.8)
NEUTROPHILS RELATIVE PERCENT: 59.2 %
PLATELET COUNT: 59 10*9/L — ABNORMAL LOW (ref 150–450)
RED BLOOD CELL COUNT: 2.96 10*12/L — ABNORMAL LOW (ref 3.95–5.13)
RED CELL DISTRIBUTION WIDTH: 20.9 % — ABNORMAL HIGH (ref 12.2–15.2)
WBC ADJUSTED: 4.1 10*9/L (ref 3.6–11.2)

## 2024-03-29 LAB — BASIC METABOLIC PANEL
ANION GAP: 11 mmol/L (ref 5–14)
BLOOD UREA NITROGEN: 19 mg/dL (ref 9–23)
BUN / CREAT RATIO: 30
CALCIUM: 9.4 mg/dL (ref 8.7–10.4)
CHLORIDE: 110 mmol/L — ABNORMAL HIGH (ref 98–107)
CO2: 30 mmol/L (ref 20.0–31.0)
CREATININE: 0.64 mg/dL (ref 0.55–1.02)
EGFR CKD-EPI (2021) FEMALE: >90 mL/min/1.73m2 (ref >=60)
GLUCOSE RANDOM: 104 mg/dL (ref 70–179)
POTASSIUM: 3.9 mmol/L (ref 3.4–4.8)
SODIUM: 151 mmol/L — ABNORMAL HIGH (ref 135–145)

## 2024-03-29 LAB — MAGNESIUM: MAGNESIUM: 2.5 mg/dL (ref 1.6–2.6)

## 2024-03-29 LAB — HEPATIC FUNCTION PANEL
ALBUMIN: 2.4 g/dL — ABNORMAL LOW (ref 3.4–5.0)
ALKALINE PHOSPHATASE: 81 U/L (ref 46–116)
ALT (SGPT): 26 U/L (ref 10–49)
AST (SGOT): 37 U/L — ABNORMAL HIGH (ref <=34)
BILIRUBIN DIRECT: 1.1 mg/dL — ABNORMAL HIGH (ref 0.00–0.30)
BILIRUBIN TOTAL: 2.1 mg/dL — ABNORMAL HIGH (ref 0.3–1.2)
PROTEIN TOTAL: 4.6 g/dL — ABNORMAL LOW (ref 5.7–8.2)

## 2024-03-29 LAB — CYSTATIN C
CYSTATIN C: 1.62 mg/L — ABNORMAL HIGH (ref 0.64–1.23)
EGFR CKD-EPI (2012) CYSTATIN C FEMALE: 36 mL/min/1.73m2 — ABNORMAL LOW (ref >=60)

## 2024-03-29 LAB — PHOSPHORUS: PHOSPHORUS: 2.8 mg/dL (ref 2.4–5.1)

## 2024-03-29 MED ADMIN — folic acid (FOLVITE) tablet 1 mg: 1 mg | GASTROENTERAL | @ 13:00:00

## 2024-03-29 MED ADMIN — thiamine mononitrate (vit B1) tablet 100 mg: 100 mg | GASTROENTERAL | @ 13:00:00

## 2024-03-29 MED ADMIN — cyanocobalamin (vitamin B-12) tablet 1,000 mcg: 1000 ug | GASTROENTERAL | @ 13:00:00

## 2024-03-29 MED ADMIN — pantoprazole (Protonix) injection 40 mg: 40 mg | INTRAVENOUS | @ 13:00:00 | Stop: 2024-03-29

## 2024-03-29 MED ADMIN — pantoprazole (Protonix) injection 40 mg: 40 mg | INTRAVENOUS

## 2024-03-29 MED ADMIN — multivitamins, therapeutic with minerals tablet 1 tablet: 1 | GASTROENTERAL | @ 20:00:00

## 2024-03-29 MED ADMIN — lactulose oral solution: 40 g | GASTROENTERAL

## 2024-03-29 MED ADMIN — lactulose oral solution: 40 g | GASTROENTERAL | @ 13:00:00

## 2024-03-29 MED ADMIN — lactulose oral solution: 40 g | GASTROENTERAL | @ 20:00:00

## 2024-03-29 MED ADMIN — lactulose oral solution: 40 g | GASTROENTERAL | @ 17:00:00

## 2024-03-29 MED ADMIN — heparin 25,000 Units/250 mL (100 units/mL) in 0.45% saline infusion (premade): 0-24 [IU]/kg/h | INTRAVENOUS | @ 04:00:00 | Stop: 2024-03-29

## 2024-03-29 MED ADMIN — heparin 25,000 Units/250 mL (100 units/mL) in 0.45% saline infusion (premade): 0-24 [IU]/kg/h | INTRAVENOUS | @ 06:00:00 | Stop: 2024-03-29

## 2024-03-29 NOTE — Progress Notes (Signed)
 Adult Nutrition Progress Note    Visit Type: Follow-Up  Reason for Visit: Enteral Nutrition    NUTRITION INTERVENTIONS and RECOMMENDATION     Recommend starting enteral nutrition:  Recommend Vital High Protein at goal rate 60 mL/hr.   This provides 1260 kcals, 110 g protein, 140 g carbohydrate, 30 g fat, 0 g fiber, 1054 mL free water , and meets 84% USRDI.  Start at 10 mL/hr and increase 10 mL/hr q4hr to goal  FWF per MD, minimum of 30 mL q4hr for tube patency  Patient meets criteria for risk of refeeding syndrome. Recommend:   Check serum elecytrolytes (potassium, magnesium , and phosphorus) before initiation of nutrition.   Monitor electrolytes q12h for at least 3 days.   Replete elecrolytes aggressively.   Supplement thiamine , recommend 200 mg IV x 5 days   Daily multivitamin - (continue after re-feed window given TF does not meet 100% of USRDI)  Weekly weight    NUTRITION ASSESSMENT     Patient appropriate for enteral nutrition support given mechanical ventilation.   Appropriate for hypocaloric / high protein regimen iso BMI > 30 / ICU status  Increased risk of refeeding given pt NPO x 3 days and with poor PO prior to NPO status as well  Lactulose  being titrated for goal of 3-5 BMs per day    NUTRITIONALLY RELEVANT DATA     HPI & PMH:   [name] is a 78 y.o. female who is presenting to [facility] with Bacteremia, escherichia coli, in the setting of the following pertinent/contributing co-morbidities:  HFpEF, HTN, Cirrhosis, Breast Cancer s/p Partial Mastectomy/Radiation.     Nutrition Progress:   Pt transferred to MICU on 10/24. Currently intubated with OG in place. Per discussion with MD - plan to start TF today. Pt was made NPO on 10/25. Variable PO intake prior to this with EHR documenting 0-75% of meals. Most recent BM was today (10/28).    Medications:  Nutritionally pertinent medications reviewed and evaluated for potential food and/or medication interactions. Include: Vit B12, folic acid , lactulose , protonix, Vit B1 (100 mg)    Labs:   Nutritionally pertinent labs reviewed.   Lab Results   Component Value Date    NA 151 (H) 03/29/2024    K 3.9 03/29/2024    CL 110 (H) 03/29/2024    CO2 30.0 03/29/2024    BUN 19 03/29/2024    CREATININE 0.64 03/29/2024    GLU 104 03/29/2024    CALCIUM 9.4 03/29/2024    ALBUMIN 2.4 (L) 03/29/2024    PHOS 2.8 03/29/2024      Recent Labs     Units 03/29/24  0527   MG mg/dL 2.5       Lab Results   Component Value Date    ALKPHOS 81 03/29/2024    BILITOT 2.1 (H) 03/29/2024    BILIDIR 1.10 (H) 03/29/2024    PROT 4.6 (L) 03/29/2024    ALBUMIN 2.4 (L) 03/29/2024    ALT 26 03/29/2024    AST 37 (H) 03/29/2024    No results for input(s): TRIG in the last 168 hours.       Nutritional Needs:   Daily Estimated Nutrient Needs:  Energy: [phone] kcals 11-14 kcal/kg (per ASPEN/SCCM guidelines for the critically ill obese, BMI 30-50) using admission body weight, 87 kg (03/29/24 1356)]  Protein: > 109 gm [> 2 gm/kg (per ASPEN/SCCM guidelines for the critically ill obese, BMI 30-39.9) using ideal body weight, 55 kg (03/29/24 1356)]  Carbohydrate:   [  45-60% of kcal]  Fluid:   mL [per MD team]    Compared to Adjusted PSU Equation for Ventillated Patients (BMI >/=30, Age >/= 60)  Tmax: 36, Ve: 4.34  Energy: 1204 kcals/day    Anthropometric Data:  Height: 162.6 cm (5' 4.02)   Admission weight: 87 kg (191 lb 12.8 oz)  Last recorded weight: 92 kg (202 lb 13.2 oz) (03/26/24)  IBW: 54.53 kg  BMI: Body mass index is 34.8 kg/m??.   Usual Body Weight: Unable to obtain at this time   Weight Assessment: per below - trending down pta, though pt with cirrhosis & currently unknown dry wt. Increase over admit likely influenced by fluid - pt noted with 2+ pitting edema bilateral knees and I/Os show pt +11 L over admit.    Wt Readings from Last 10 Encounters:   03/26/24 92 kg (202 lb 13.2 oz)   03/07/24 89.3 kg (196 lb 12.8 oz)   03/03/24 89.1 kg (196 lb 6.4 oz)   04/23/23 96.6 kg (213 lb)   03/05/23 100.2 kg (220 lb 14.4 oz)   11/27/22 (!) 102.5 kg (225 lb 14.4 oz)   10/20/22 (!) 108 kg (238 lb)   07/17/22 (!) 108 kg (238 lb)   06/09/22 (!) 103.2 kg (227 lb 8 oz)   05/08/22 (!) 105.8 kg (233 lb 3.2 oz)     Malnutrition Assessment:  Malnutrition Assessment using AND/ASPEN or GLIM Clinical Characteristics:  Patient does not meet AND/ASPEN criteria for malnutrition at this time (03/16/24 1326)       Nutrition Focused Physical Exam:      Nutrition Evaluation  Overall Impressions: Nutrition-Focused Physical Exam not indicated due to lack of malnutrition risk factors. (03/16/24 1326)     Care plan:  Patient does not meet malnutrition criteria     Current Nutrition:  NPO with OG tube in place   Nutrition Orders            Vital High Protein (Critical Care Low Cal/HP) continuous tube feed starting at 10/28 1500          Nutritionally Pertinent Allergies, Intolerances, Sensitivities, and/or Cultural/Religious Restrictions:  none identified at this time     GOALS and EVALUATION     Patient to meet 80% or greater of nutritional needs via enteral nutrition while remains on tube feeds. - New  Patient to meet 75% or greater of nutritional needs via combination of meals, snacks, and/or oral supplements within admission.  - Not Met/No longer indicated     Motivation, Barriers, and Compliance:  Evaluation of motivation, barriers, and compliance pending at this time due to clinical status.     Discharge Planning:   Monitor for potential discharge needs with multi-disciplinary team.          Follow-Up Parameters:   1-2 times per week (and more frequent as indicated)    Odella Emmer, MPH, RD, LDN  Pager: [phone]

## 2024-03-29 NOTE — Consults (Signed)
 Enoxaparin Therapeutic Monitoring Pharmacy Note    [name] is a 78 y.o. female starting enoxaparin.    Indication: left ventricular thrombus    Prior Dosing Information: None/new initiation    Goals:  Therapeutic Drug Levels  LMWH Anti-Xa level: 0.6-1 units/mL drawn 4-6 hours post-dose    Additional Clinical Monitoring/Outcomes  Monitor hemoglobin and platelets  Monitor for signs/symptoms of bleeding  Monitor renal function     Results:  Not applicable  Wt Readings from Last 3 Encounters:   03/26/24 92 kg (202 lb 13.2 oz)   03/07/24 89.3 kg (196 lb 12.8 oz)   03/03/24 89.1 kg (196 lb 6.4 oz)     HGB   Date Value Ref Range Status   03/29/2024 9.2 (L) 11.3 - 14.9 g/dL Final     Platelet   Date Value Ref Range Status   03/29/2024 59 (L) 150 - 450 10*9/L Final     Creatinine   Date Value Ref Range Status   03/29/2024 0.64 0.55 - 1.02 mg/dL Final       Pharmacokinetic Considerations and Significant Drug Interactions:   Concurrent antiplatelet medications: none identified    Assessment/Plan:   Recommendation(s)  Start enoxaparin 80 mg BID. This is slightly less than 1 mg/kg BID, however given reduced renal function via Cystatin C and impressive sensitivity to heparin gtt will dose conservatively and check Anti-Xa to ensure dose is therapeutic    Follow-up  Level due: 4-6 hours after the 4th dose  A pharmacist will continue to monitor and recommend levels as appropriate      Please page service pharmacist with questions/clarifications.    Arland English, PharmD, BCCCP  Critical Care Clinical Pharmacist  Medicine ICU

## 2024-03-29 NOTE — Progress Notes (Signed)
 Pt passes SBT per VBG results, but she does not wake up and has a weak cough effort.  Strong concern for inability to protect her airway if extubated at this time.

## 2024-03-29 NOTE — Plan of Care (Signed)
 Pt does not follow commands, withdraws to pain, reflexes intact, pupils reactive. Afib. Afebrile. Normotensive. Remains on 10/5 30% FiO2. 2 Bms this shift, no signs of fresh blood. ~364mL UO via foley. TF initiated at 23mL/hr. Daughter updated via telephone.    Problem: Skin Injury Risk Increased  Goal: Skin Health and Integrity  Intervention: Optimize Skin Protection  Recent Flowsheet Documentation  Taken 03/29/2024 1800 by Delores Maurilio HERO, RN  Head of Bed [facility]) Positioning: HOB at 30-45 degrees  Taken 03/29/2024 1600 by Delores Maurilio HERO, RN  Activity Management: bedrest  Head of Bed [facility]) Positioning: HOB at 30-45 degrees  Taken 03/29/2024 1400 by Delores Maurilio HERO, RN  Activity Management: bedrest  Head of Bed [facility]) Positioning: HOB at 30-45 degrees  Taken 03/29/2024 1200 by Delores Maurilio HERO, RN  Activity Management: bedrest  Head of Bed [facility]) Positioning: HOB at 30-45 degrees  Taken 03/29/2024 1000 by Delores Maurilio HERO, RN  Activity Management: bedrest  Head of Bed [facility]) Positioning: HOB at 30-45 degrees  Taken 03/29/2024 0800 by Delores Maurilio HERO, RN  Activity Management: bedrest  Pressure Reduction Techniques:   heels elevated off bed   weight shift assistance provided  Head of Bed (HOB) Positioning: HOB at 30-45 degrees  Pressure Reduction Devices:   heel offloading device utilized   positioning supports utilized   pressure-redistributing mattress utilized  Skin Protection:   adhesive use limited   cleansing with dimethicone incontinence wipes   tubing/devices free from skin contact     Problem: Adult Inpatient Plan of Care  Goal: Absence of Hospital-Acquired Illness or Injury  Intervention: Identify and Manage Fall Risk  Recent Flowsheet Documentation  Taken 03/29/2024 0800 by Delores Maurilio HERO, RN  Safety Interventions:   aspiration precautions   environmental modification   fall reduction program maintained   infection management   low bed   lighting adjusted for tasks/safety  Intervention: Prevent Skin Injury  Recent Flowsheet Documentation  Taken 03/29/2024 1800 by Delores Maurilio HERO, RN  Positioning for Skin: Left  Taken 03/29/2024 1600 by Delores Maurilio HERO, RN  Positioning for Skin: Right  Taken 03/29/2024 1400 by Delores Maurilio HERO, RN  Positioning for Skin: Left  Taken 03/29/2024 1200 by Delores Maurilio HERO, RN  Positioning for Skin: Right  Taken 03/29/2024 1000 by Delores Maurilio HERO, RN  Positioning for Skin: Left  Taken 03/29/2024 0800 by Delores Maurilio HERO, RN  Positioning for Skin: Right  Device Skin Pressure Protection:   absorbent pad utilized/changed   adhesive use limited   tubing/devices free from skin contact  Skin Protection:   adhesive use limited   cleansing with dimethicone incontinence wipes   tubing/devices free from skin contact  Intervention: Prevent Infection  Recent Flowsheet Documentation  Taken 03/29/2024 0800 by Delores Maurilio HERO, RN  Infection Prevention:   cohorting utilized   environmental surveillance performed   equipment surfaces disinfected   hand hygiene promoted   personal protective equipment utilized   rest/sleep promoted   single patient room provided   visitors restricted/screened     Problem: Breathing Pattern Ineffective  Goal: Effective Breathing Pattern  Intervention: Promote Improved Breathing Pattern  Recent Flowsheet Documentation  Taken 03/29/2024 1800 by Delores Maurilio HERO, RN  Head of Bed [facility]) Positioning: HOB at 30-45 degrees  Taken 03/29/2024 1600 by Delores Maurilio HERO, RN  Head of Bed [facility]) Positioning: HOB at 30-45 degrees  Taken 03/29/2024 1400 by Delores Maurilio HERO, RN  Head of Bed [facility]) Positioning: HOB at 30-45  degrees  Taken 03/29/2024 1200 by Delores Maurilio HERO, RN  Head of Bed [facility]) Positioning: HOB at 30-45 degrees  Taken 03/29/2024 1000 by Delores Maurilio HERO, RN  Head of Bed [facility]) Positioning: HOB at 30-45 degrees  Taken 03/29/2024 0800 by Delores Maurilio HERO, RN  Head of Bed [facility]) Positioning: HOB at 30-45 degrees     Problem: Fall Injury Risk  Goal: Absence of Fall and Fall-Related Injury  Intervention: Promote Injury-Free Environment  Recent Flowsheet Documentation  Taken 03/29/2024 0800 by Delores Maurilio HERO, RN  Safety Interventions:   aspiration precautions   environmental modification   fall reduction program maintained   infection management   low bed   lighting adjusted for tasks/safety     Problem: Gas Exchange Impaired  Goal: Optimal Gas Exchange  Intervention: Optimize Oxygenation and Ventilation  Recent Flowsheet Documentation  Taken 03/29/2024 1800 by Delores Maurilio HERO, RN  Head of Bed [facility]) Positioning: HOB at 30-45 degrees  Taken 03/29/2024 1600 by Delores Maurilio HERO, RN  Head of Bed [facility]) Positioning: HOB at 30-45 degrees  Taken 03/29/2024 1400 by Delores Maurilio HERO, RN  Head of Bed [facility]) Positioning: HOB at 30-45 degrees  Taken 03/29/2024 1200 by Delores Maurilio HERO, RN  Head of Bed [facility]) Positioning: HOB at 30-45 degrees  Taken 03/29/2024 1000 by Delores Maurilio HERO, RN  Head of Bed [facility]) Positioning: HOB at 30-45 degrees  Taken 03/29/2024 0800 by Delores Maurilio HERO, RN  Head of Bed [facility]) Positioning: HOB at 30-45 degrees     Problem: Non-Violent Restraints  Intervention: Utilize least restrictive measures  Recent Flowsheet Documentation  Taken 03/29/2024 1800 by Delores Maurilio HERO, RN  Less Restrictive Alternative:   1:1 patient care   Repositioning  Taken 03/29/2024 1600 by Delores Maurilio HERO, RN  Less Restrictive Alternative:   1:1 patient care   Repositioning  Taken 03/29/2024 1400 by Delores Maurilio HERO, RN  Less Restrictive Alternative:   1:1 patient care   Repositioning  Taken 03/29/2024 1313 by Delores Maurilio HERO, RN  Less Restrictive Alternative:   1:1 patient care   Repositioning  Taken 03/29/2024 1200 by Delores Maurilio HERO, RN  Less Restrictive Alternative:   1:1 patient care   Repositioning  Taken 03/29/2024 1000 by Delores Maurilio HERO, RN  Less Restrictive Alternative:   1:1 patient care   Repositioning  Taken 03/29/2024 0800 by Delores Maurilio HERO, RN  Less Restrictive Alternative:   1:1 patient care   Repositioning  Intervention: Patient Monitoring  Recent Flowsheet Documentation  Taken 03/29/2024 1800 by Delores Maurilio HERO, RN  Psychological Status/Visual Check: Subdued  Circulation/Skin Integrity: No signs of injury  Range of Motion: Performed  Fluids: NPO  Food/Meal: NPO  Elimination: Urinary catheter  Taken 03/29/2024 1600 by Delores Maurilio HERO, RN  Psychological Status/Visual Check: Subdued  Circulation/Skin Integrity: No signs of injury  Range of Motion: Performed  Fluids: NPO  Food/Meal: NPO  Elimination: Urinary catheter  Taken 03/29/2024 1400 by Delores Maurilio HERO, RN  Psychological Status/Visual Check: Subdued  Circulation/Skin Integrity: No signs of injury  Range of Motion: Performed  Fluids: NPO  Food/Meal: NPO  Elimination: Urinary catheter  Taken 03/29/2024 1313 by Delores Maurilio HERO, RN  Psychological Status/Visual Check: Subdued  Circulation/Skin Integrity: No signs of injury  Range of Motion: Performed  Fluids: NPO  Food/Meal: NPO  Elimination: Urinary catheter  Taken 03/29/2024 1200 by Delores Maurilio HERO, RN  Psychological Status/Visual Check: Subdued  Circulation/Skin Integrity: No signs of injury  Range of  Motion: Performed  Fluids: NPO  Food/Meal: NPO  Elimination: Urinary catheter  Taken 03/29/2024 1000 by Delores Maurilio HERO, RN  Psychological Status/Visual Check: Subdued  Circulation/Skin Integrity: No signs of injury  Range of Motion: Performed  Fluids: NPO  Food/Meal: NPO  Elimination: Urinary catheter  Taken 03/29/2024 0800 by Delores Maurilio HERO, RN  Psychological Status/Visual Check: Subdued  Circulation/Skin Integrity: No signs of injury  Range of Motion: Performed  Fluids: NPO  Food/Meal: NPO  Elimination: Urinary catheter  Intervention: Patient Education  Recent Flowsheet Documentation  Taken 03/29/2024 1400 by Delores Maurilio HERO, RN  Criteria Explained: Yes  Patient's Response: Patient Unable to respond  Family Notification: Other  Taken 03/29/2024 1313 by Delores Maurilio HERO, RN  Criteria Explained: Yes  Patient's Response: Patient Unable to respond  Family Notification: Other     Problem: Wound  Goal: Optimal Functional Ability  Intervention: Optimize Functional Ability  Recent Flowsheet Documentation  Taken 03/29/2024 1600 by Delores Maurilio HERO, RN  Activity Management: bedrest  Taken 03/29/2024 1400 by Delores Maurilio HERO, RN  Activity Management: bedrest  Taken 03/29/2024 1200 by Delores Maurilio HERO, RN  Activity Management: bedrest  Taken 03/29/2024 1000 by Delores Maurilio HERO, RN  Activity Management: bedrest  Taken 03/29/2024 0800 by Delores Maurilio HERO, RN  Activity Management: bedrest  Goal: Absence of Infection Signs and Symptoms  Intervention: Prevent or Manage Infection  Recent Flowsheet Documentation  Taken 03/29/2024 0800 by Delores Maurilio HERO, RN  Infection Management: aseptic technique maintained  Goal: Skin Health and Integrity  Intervention: Optimize Skin Protection  Recent Flowsheet Documentation  Taken 03/29/2024 1800 by Delores Maurilio HERO, RN  Head of Bed [facility]) Positioning: HOB at 30-45 degrees  Taken 03/29/2024 1600 by Delores Maurilio HERO, RN  Activity Management: bedrest  Head of Bed [facility]) Positioning: HOB at 30-45 degrees  Taken 03/29/2024 1400 by Delores Maurilio HERO, RN  Activity Management: bedrest  Head of Bed [facility]) Positioning: HOB at 30-45 degrees  Taken 03/29/2024 1200 by Delores Maurilio HERO, RN  Activity Management: bedrest  Head of Bed [facility]) Positioning: HOB at 30-45 degrees  Taken 03/29/2024 1000 by Delores Maurilio HERO, RN  Activity Management: bedrest  Head of Bed [facility]) Positioning: HOB at 30-45 degrees  Taken 03/29/2024 0800 by Delores Maurilio HERO, RN  Activity Management: bedrest  Pressure Reduction Techniques:   heels elevated off bed   weight shift assistance provided  Head of Bed (HOB) Positioning: HOB at 30-45 degrees  Pressure Reduction Devices:   heel offloading device utilized   positioning supports utilized   pressure-redistributing mattress utilized  Skin Protection:   adhesive use limited   cleansing with dimethicone incontinence wipes   tubing/devices free from skin contact     Problem: Mechanical Ventilation Invasive  Goal: Optimal Device Function  Intervention: Optimize Device Care and Function  Recent Flowsheet Documentation  Taken 03/29/2024 1600 by Delores Maurilio HERO, RN  Oral Care:   mouth swabbed   oral rinse provided   suction provided  Taken 03/29/2024 1200 by Delores Maurilio HERO, RN  Oral Care:   mouth swabbed   suction provided   oral rinse provided  Goal: Absence of Device-Related Skin and Tissue Injury  Intervention: Maintain Skin and Tissue Health  Recent Flowsheet Documentation  Taken 03/29/2024 0800 by Delores Maurilio HERO, RN  Device Skin Pressure Protection:   absorbent pad utilized/changed   adhesive use limited   tubing/devices free from skin contact  Goal: Absence of Ventilator-Induced Lung Injury  Intervention: Prevent  Ventilator-Associated Pneumonia  Recent Flowsheet Documentation  Taken 03/29/2024 1800 by Delores Maurilio HERO, RN  Head of Bed [facility]) Positioning: HOB at 30-45 degrees  Taken 03/29/2024 1600 by Delores Maurilio HERO, RN  Head of Bed [facility]) Positioning: HOB at 30-45 degrees  Oral Care:   mouth swabbed   oral rinse provided   suction provided  Taken 03/29/2024 1400 by Delores Maurilio HERO, RN  Head of Bed [facility]) Positioning: HOB at 30-45 degrees  Taken 03/29/2024 1200 by Delores Maurilio HERO, RN  Head of Bed [facility]) Positioning: HOB at 30-45 degrees  Oral Care:   mouth swabbed   suction provided   oral rinse provided  Taken 03/29/2024 1000 by Delores Maurilio HERO, RN  Head of Bed [facility]) Positioning: HOB at 30-45 degrees  Taken 03/29/2024 0800 by Delores Maurilio HERO, RN  Head of Bed [facility]) Positioning: HOB at 30-45 degrees     Problem: Mechanical Ventilation Invasive  Goal: Optimal Device Function  Intervention: Optimize Device Care and Function  Recent Flowsheet Documentation  Taken 03/29/2024 1600 by Delores Maurilio HERO, RN  Oral Care:   mouth swabbed   oral rinse provided   suction provided  Taken 03/29/2024 1200 by Delores Maurilio HERO, RN  Oral Care:   mouth swabbed   suction provided   oral rinse provided  Goal: Absence of Device-Related Skin and Tissue Injury  Intervention: Maintain Skin and Tissue Health  Recent Flowsheet Documentation  Taken 03/29/2024 0800 by Delores Maurilio HERO, RN  Device Skin Pressure Protection:   absorbent pad utilized/changed   adhesive use limited   tubing/devices free from skin contact  Goal: Absence of Ventilator-Induced Lung Injury  Intervention: Prevent Ventilator-Associated Pneumonia  Recent Flowsheet Documentation  Taken 03/29/2024 1800 by Delores Maurilio HERO, RN  Head of Bed [facility]) Positioning: HOB at 30-45 degrees  Taken 03/29/2024 1600 by Delores Maurilio HERO, RN  Head of Bed [facility]) Positioning: HOB at 30-45 degrees  Oral Care:   mouth swabbed   oral rinse provided   suction provided  Taken 03/29/2024 1400 by Delores Maurilio HERO, RN  Head of Bed [facility]) Positioning: HOB at 30-45 degrees  Taken 03/29/2024 1200 by Delores Maurilio HERO, RN  Head of Bed [facility]) Positioning: HOB at 30-45 degrees  Oral Care:   mouth swabbed   suction provided   oral rinse provided  Taken 03/29/2024 1000 by Delores Maurilio HERO, RN  Head of Bed [facility]) Positioning: HOB at 30-45 degrees  Taken 03/29/2024 0800 by Delores Maurilio HERO, RN  Head of Bed [facility]) Positioning: HOB at 30-45 degrees     Problem: Mechanical Ventilation Invasive  Goal: Optimal Device Function  Intervention: Optimize Device Care and Function  Recent Flowsheet Documentation  Taken 03/29/2024 1600 by Delores Maurilio HERO, RN  Oral Care:   mouth swabbed   oral rinse provided   suction provided  Taken 03/29/2024 1200 by Delores Maurilio HERO, RN  Oral Care:   mouth swabbed   suction provided   oral rinse provided  Goal: Absence of Device-Related Skin and Tissue Injury  Intervention: Maintain Skin and Tissue Health  Recent Flowsheet Documentation  Taken 03/29/2024 0800 by Delores Maurilio HERO, RN  Device Skin Pressure Protection:   absorbent pad utilized/changed   adhesive use limited   tubing/devices free from skin contact  Goal: Absence of Ventilator-Induced Lung Injury  Intervention: Prevent Ventilator-Associated Pneumonia  Recent Flowsheet Documentation  Taken 03/29/2024 1800 by Delores Maurilio HERO, RN  Head of Bed [facility]) Positioning: HOB at 30-45 degrees  Taken 03/29/2024 1600 by Delores Maurilio HERO, RN  Head of Bed [facility]) Positioning: HOB at 30-45 degrees  Oral Care:   mouth swabbed   oral rinse provided   suction provided  Taken 03/29/2024 1400 by Delores Maurilio HERO, RN  Head of Bed [facility]) Positioning: HOB at 30-45 degrees  Taken 03/29/2024 1200 by Delores Maurilio HERO, RN  Head of Bed [facility]) Positioning: HOB at 30-45 degrees  Oral Care:   mouth swabbed   suction provided   oral rinse provided  Taken 03/29/2024 1000 by Delores Maurilio HERO, RN  Head of Bed [facility]) Positioning: HOB at 30-45 degrees  Taken 03/29/2024 0800 by Delores Maurilio HERO, RN  Head of Bed [facility]) Positioning: HOB at 30-45 degrees     Problem: Mechanical Ventilation Invasive  Goal: Optimal Device Function  Intervention: Optimize Device Care and Function  Recent Flowsheet Documentation  Taken 03/29/2024 1600 by Delores Maurilio HERO, RN  Oral Care:   mouth swabbed   oral rinse provided   suction provided  Taken 03/29/2024 1200 by Delores Maurilio HERO, RN  Oral Care:   mouth swabbed   suction provided   oral rinse provided  Goal: Absence of Device-Related Skin and Tissue Injury  Intervention: Maintain Skin and Tissue Health  Recent Flowsheet Documentation  Taken 03/29/2024 0800 by Delores Maurilio HERO, RN  Device Skin Pressure Protection:   absorbent pad utilized/changed   adhesive use limited   tubing/devices free from skin contact  Goal: Absence of Ventilator-Induced Lung Injury  Intervention: Prevent Ventilator-Associated Pneumonia  Recent Flowsheet Documentation  Taken 03/29/2024 1800 by Delores Maurilio HERO, RN  Head of Bed [facility]) Positioning: HOB at 30-45 degrees  Taken 03/29/2024 1600 by Delores Maurilio HERO, RN  Head of Bed [facility]) Positioning: HOB at 30-45 degrees  Oral Care:   mouth swabbed   oral rinse provided   suction provided  Taken 03/29/2024 1400 by Delores Maurilio HERO, RN  Head of Bed [facility]) Positioning: HOB at 30-45 degrees  Taken 03/29/2024 1200 by Delores Maurilio HERO, RN  Head of Bed [facility]) Positioning: HOB at 30-45 degrees  Oral Care:   mouth swabbed   suction provided   oral rinse provided  Taken 03/29/2024 1000 by Delores Maurilio HERO, RN  Head of Bed [facility]) Positioning: HOB at 30-45 degrees  Taken 03/29/2024 0800 by Delores Maurilio HERO, RN  Head of Bed [facility]) Positioning: HOB at 30-45 degrees

## 2024-03-29 NOTE — Treatment Plan (Signed)
 Hepatology Consult Service   Treatment Plan         Assessment and Recommendations:   [name] is a 78 y.o. female with a PMHx of HFpEF, HTN, Afib on AC, MASLD cirrhosis who presented to [facility] with originally hospitalized for scheduled TEE/DCCV (aborted d/t LAA clot) c/b AHHRF, encephalopathy and septic shock requiring MICU care. Extubated and transferred to [facility] but had to intubated and transferred back to [facility] MICU for closer monitoring for respiratory status, pressor requirement, and management of carotid artery injury. The patient is seen in consultation at the request of Kieran G Leong, DO (Medical ICU (MDI)) for UGIB.     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 A 10mm bleeding Mallory-Weiss tear found and clipped with [employer]ves[employer] device (suspect from traumatic enteric tube placement).   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 Developed worsening encephalopathy at [facility] 10/24 requiring reintubation, concern that this was hepatic in nature so lactulose  was resumed.    Volume: home regimen - lasix 40mg , spiro 25mg   - Hold home diuretics given electrolyte abnormalities     Infection: no history of ascites     Bleeding: Last EGD 01/2018 significant for grade I esophageal varices and portal hypertensive gastropathy. Due for outpatient screening.    Encephalopathy: likely multifactorial in the setting of electrolyte abnormalities and recent sedation, difficult to assess whether HE is contributing as patient remains ventilated/not able to participate in exam  - Continue lactulose  with goal 2-3BM daily    MELD 3.0: 14 at 03/27/2024  5:19 AM  MELD-Na: 11 at 03/27/2024  5:19 AM  Calculated from:  Serum Creatinine: 0.99 mg/dL (Using min of 1 mg/dL) at 89/73/7974  4:80 AM  Serum Sodium: 146 mmol/L (Using max of 137 mmol/L) at 03/27/2024  5:19 AM  Total Bilirubin: 2.2 mg/dL at 89/73/7974  4:80 AM  Serum Albumin: 2.8 g/dL at 89/73/7974  4:80 AM  INR(ratio): 1.19 at 03/25/2024 10:27 PM  Age at listing (hypothetical): 78 years  Sex: Female at 03/27/2024  5:19 AM    Issues Impacting Complexity of Management:  -None    Recommendations discussed with the patient's primary team. We will sign-off at this time, please re-contact if additional questions or a new need for consultation arises.

## 2024-03-29 NOTE — Consults (Signed)
 Adult Nutrition Reassessment Note    Visit Type: MD Consult  Reason for Visit: Assessment (Nutrition)    Recommendations:  Recommend starting enteral nutrition:  Recommend Vital High Protein at goal rate 60 mL/hr.   This provides 1260 kcals, 110 g protein, 140 g carbohydrate, 30 g fat, 0 g fiber, 1054 mL free water , and meets 84% USRDI.  Start at 10 mL/hr and increase 10 mL/hr q4hr to goal  FWF per MD, minimum of 30 mL q4hr for tube patency  Patient meets criteria for risk of refeeding syndrome. Recommend:   Check serum elecytrolytes (potassium, magnesium , and phosphorus) before initiation of nutrition.   Monitor electrolytes q12h for at least 3 days.   Replete elecrolytes aggressively.   Supplement thiamine , recommend 200 mg IV x 5 days   Daily multivitamin - (continue after re-feed window given TF does not meet 100% of USRDI)  Weekly weight    Interval History:  Pt already being followed by nutrition service. Brief note serves to complete consult.     Nutritional Needs:   Daily Estimated Nutrient Needs:  Energy: [id number] kcals 11-14 kcal/kg (per ASPEN/SCCM guidelines for the critically ill obese, BMI 30-50) using admission body weight, 87 kg (03/29/24 1356)]  Protein: > 109 gm [> 2 gm/kg (per ASPEN/SCCM guidelines for the critically ill obese, BMI 30-39.9) using ideal body weight, 55 kg (03/29/24 1356)]  Carbohydrate:   [45-60% of kcal]  Fluid:   mL [per MD team]    Compared to Adjusted PSU Equation for Ventillated Patients (BMI >/=30, Age >/= 60)  Tmax: 36, Ve: 4.34  Energy: 1204 kcals/day    Please see note from 03/29/24 for full assessment.    Odella Emmer, MPH, RD, LDN  Pager: [phone]

## 2024-03-29 NOTE — Progress Notes (Signed)
 MICU Daily Progress Note     Date of Service: 03/29/2024    Problem List:   Principal Problem:    Bacteremia, escherichia coli  Active Problems:    Alcoholic liver disease (HHS-HCC)    HTN (hypertension)    Depression with anxiety    Class 2 severe obesity with serious comorbidity in adult    Atrial fibrillation    (CMS-HCC)    Pleural effusion    Hypernatremia    Thrombocytopenia    Cirrhosis    (CMS-HCC)    A-fib (CMS-HCC)    Hepatic encephalopathy    (CMS-HCC)      Interval history: [name] is a 78 y.o. female with HFpEF, HTN, Afib on AC, MASLD cirrhosis, originally hospitalized for scheduled TEE/DCCV (aborted d/t LAA clot) c/b AHHRF, encephalopathy and septic shock requiring MICU care, was s/p extubation transferred to [facility] for further management of anticoagulation and hypernatremia. Transferred back to [facility] MICU for closer monitoring for respiratory status, pressor requirement, GI bleed, and management of carotid artery injury.     On 10/24, patient had acute worsening of respiratory status leading requiring intubation. When patient was given sedation for intubation, she coded and promptly had ROSC after a few minutes of CPR. Subsequently, CVC placed in neck, used to draw blood, unclear if given pressors through it, then found  to be in arterial system based on blood gas and imaging, likely right carotid. Line was subsequently removed, and pressure was held for 15 minutes, hemostasis with no hematoma. She was transferred to [facility] MICU for management of this carotid injury, to be evaluated by vascular surgery while also managing her increased vasopressor requirement and respiratory distress.  Has been weaned off pressors since 10/27 at 1 PM. VBG's indicating that she is ready for extubation, however her mentation suggests that she is not able to protect her airway    24 hour events:  - 24+ hours without pressor support  - APTT elevated this morning, switched to therapeutic lovenox for anticoagulation  - Plan to resume tube feeds per nutritions recs   Neurological   Analgosedation  Altered mental status with history of hepatic encephalopathy  Per chart review at [facility], rapid response called on 10/24 for increased somnolence. At the time, differential included hepatic encephalopathy, toxic metabolic encephalopathy in the setting of shock (possibly sepsis), versus intracranial pathology. She was subsequently intubated. CT head on 10/24 showed no acute intracranial abnormality. Weaned off sedation with slow improvements in neurological status.  Plan to extubate when mental status improves.  - Goal RASS 0 to -1  - Continue lactulose     Analgesia: No pain issues  RASS at goal? Yes  [place] Agitation Assessment Scale (RASS) : -1 (03/29/2024  4:00 AM)    Pulmonary   Acute Hypoxic Hypercarbic Respiratory Failure c/f septic shock   Rapid response called on 10/24 for increased somnolence, found to have acute hypercarbic respiratory failure and subsequently intubated. Suspect hypercarbic respiratory failure due to encephalopathy as above. CXR on 10/24 showed worsening pleural effusions. Not currently getting diuresed. Given patient's hypoxia, and AMS, there was concern for infectious etiology. MRSA nares negative. Infectious workup thus far is unremarkable. SBTs show readiness extubation based on VBGs, however there is concern that she continues to be unable to protect her airway due to her current mentation   - S/p Cefepime & Vanc, not currently on antibiotics   - Extubate pending improving mentation   - LRCx pending  - follow ABG, lactate, CBC  FiO2 (%):  [30 %-40 %] 30 %  PR SUP:  [5 cm H20] 5 cm H20  O2 Device: Ventilator    Cardiovascular   Carotid Artery Injury (resolved)  At [facility], CVC placed intended for RIJ on 10/24 following intubation, PEA, ROSC. Was used with known blood return. Unclear if it was used for pressor support, but highly likely that it was. CXR for placement check showed concern for intra aterial location, and blood gas confirmed it was arterial. Thought to be in carotid artery. CVC removed with pressure held for 15 minutes, with no hematoma formation. On arrival, no physical exam and bedside ultrasound showed no large hematoma. No active bleeding.   - Vascular Surgery consulted, stating fistula has healed based on Ultrasound findings    PEA arrest s/p ROSC  Hypovolemic/Hemorrhagic shock (resolved)  Atrial fibrillation  known left atrial appendage clot  HFpEF  Patient with PEA arrest peri-intubation with ROSC. Shock thought to be hemorrhagic in the setting of GI bleed History of atrial fibrillation with known LAA clot and HFpEF. Vasopressors were weaned off 10/27. APTT was elevated the morning of 10/28 necessitating changing anticoagulation from therapeutic heparin to therapeutic Lovenox .   - Hold Coreg , eliquis, GDMT given hemodynamic instability, consider restarting once more clinically stable  - Remains intermittently NE  - Continue anticoagulation with therapeutic lovenox for LAA clot    Renal   Hypernatremia, resolving  At [facility], had persistently hypernatremia near 153. Had been treated with D5 gtt. Sodium throughout admission has largely been 146-148. Sodium was 151 morning of 10/28, presume this will decrease once we start tube feeds.   - Strict I/O's  - Sodium check q6hours   - FWF with TF     Infectious Disease/Autoimmune   E. Coli Bacteremia (resolved),   Initially admitted for afib, found to have E. coli bacteremia on October 10. This was treated with cefazolin . Rapid response called 10/24, patient noted to be hypothermic. WBCs jumped from 3.0 to 9.7 on 10/24. Although the most recent lab was following intubation and CPR. UA with pyuria, rare bacteria, hematuria. Peri-intubation, patient developed significant hypotension requiring initiation of NE and vasopressin. Suspect possible septic shock with unknown infectious source. Patient started on vancomycin and cefepime 10/24. CT Abdomen pelvis concerning for aspiration pneumonia. Following infectious workup was unremarkable. Antibiotics were stopped on 10/27.   - stopping cefepime; stopped Vanc  - follow blood, urine, lower resp culture    Cultures:  Blood Culture, Routine (no units)   Date Value   03/25/2024 No Growth at 72 hours   03/25/2024 No Growth at 72 hours     Urine Culture, Comprehensive (no units)   Date Value   03/25/2024 <10,000 CFU/mL     Lower Respiratory Culture (no units)   Date Value   03/27/2024 TOO YOUNG TO READ     WBC (10*9/L)   Date Value   03/29/2024 4.1     WBC, UA (/HPF)   Date Value   03/25/2024 25 (H)       FEN/GI   Sigmoid Mass c/f Malignancy and Hematochezia  Hemorrhagic Shock  CT abdomen pelvis done 10/24 with evidence of cirrhosis and masslike thickening of the lower sigmoid colon concerning for malignancy.  CTA abdomen pelvis performed 1024 with active extravasation into the gastric fundus possibly secondary to traumatic enteric tube placement.  GI scoped the patient on 10/25 and clipped an area of active bleeding.  They removed a total of 1.5 L of blood.  Hemoglobin  10/20 6 AM downtrended to 6.0, so 2 units of blood transfused.  GI stated this was consistent with 10/25 scope and likely did not represent increased bleeding. Hb steadily improving with Hb of 9.2 on 10/28.   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 Given shock of unclear etiology. CTAP demonstrating cirrhosis. Likely that cirrhosis is contributing to hypotension.   - Continue lactulose ; increasing to to target 3-5 BMs daily  - NGT in place  - Daily MELD labs and CMP  - clotting factors     MELD 3.0: 14 at 03/27/2024  5:19 AM  MELD-Na: 11 at 03/27/2024  5:19 AM  Calculated from:  Serum Creatinine: 0.99 mg/dL (Using min of 1 mg/dL) at 89/73/7974  4:80 AM  Serum Sodium: 146 mmol/L (Using max of 137 mmol/L) at 03/27/2024  5:19 AM  Total Bilirubin: 2.2 mg/dL at 89/73/7974  4:80 AM  Serum Albumin: 2.8 g/dL at 89/73/7974  4:80 AM  INR(ratio): 1.19 at 03/25/2024 10:27 PM  Age at listing (hypothetical): 78 years  Sex: Female at 03/27/2024  5:19 AM    Provider Malnutrition Assessment:  Body mass index is 34.8 kg/m??.BMI Interpretation: >/= 30 and < 40, consistent with obesity, clinically significant requiring additional resources and complicating multiple aspects of patient care.  GLIM criteria:   Pt does not meet criteria  -I have screened this patient for malnutrition and they did NOT meet criteria for malnutrition based on GLIM criteria.  -Starting TF and FWF; Dietician consulted, appreciate assistance    Heme/Coag   Acute blood loss anemia, resolving  Hemoglobin down trended to 6.0 on the morning of 10/26 thought to be secondary to acute GI bleed that was scoped and clipped 10/25. Patient received 2 units PRBC 10/26 and an additional unit 10/27. Hb steadily improving with Hb of 9.2 on 10/28.   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 Reason: Not indicated.    Prophylaxis/LDA/Restraints/Consults   ICU Checklist completed: yes (see ICU rounding navigator in Epic)    Patient Lines/Drains/Airways Status       Active Active Lines, Drains, & Airways       Name Placement date Placement time Site Days    ETT  7.5 03/25/24  1317  -- 3    CVC Triple Lumen 03/25/24 Non-tunneled Left Internal jugular 03/25/24  2316  Internal jugular  3    NG/OG Tube 18 Fr. Center mouth 03/25/24  1800  Center mouth  3    Urethral Catheter Temperature probe;Non-latex 03/25/24  1354  Temperature probe;Non-latex  3    Peripheral IV 03/20/24 Anterior;Left;Upper Arm 03/20/24  1730  Arm  8    Peripheral IV 03/25/24 Left;Posterior Hand 03/25/24  1950  Hand  3                  Patient Lines/Drains/Airways Status       Active Wounds       Name Placement date Placement time Site Days    Wound 03/01/24 Other (Comment) Toe (  Comment which one) Left;Other (Comment) Blister present on arrival, tx already in outpatient setting, in process of healing, surrounding erythema 03/01/24  0129  Toe (Comment which one)  28    Wound 03/13/24 Irritant Contact Dermatitis Incontinence Sacrum Mid gluteal cleft MASD? 03/13/24  --  Sacrum  16                  Goals of Care     Code Status:   Orders Placed This Encounter   Procedures    Full Code     Standing Status:   Standing     Number of Occurrences:   1        Designated Healthcare Decision Maker:  Ms. Glasner designated healthcare decision maker(s) is/are   HCDM (patient stated preference): Nembhard,[name] - Spouse - [phone]    HCDM, First AlternateDoralyn, Kirkes - Daughter - [phone]. See HCDM section of Epic sidebar/storyboard or ACP tab in patient chart for details regarding active HCDMs and patient capacity for decision-making.      Subjective     Patient is resting comfortably in bed intubated on the ventilator.  Did not rouse to voice or noxious stimuli.     Objective     Vitals - past 24 hours  Temp:  [36 ??C (96.8 ??F)] 36 ??C (96.8 ??F)  Pulse:  [56-72] 65  SpO2 Pulse:  [54-71] 65  Resp:  [12-31] 17  BP: (93-129)/(44-87) 123/65  FiO2 (%):  [30 %-40 %] 30 %  SpO2:  [96 %-100 %] 100 % Intake/Output  I/O last 3 completed shifts:  In: 2527 [I.V.:967.2; Blood:700; NG/GT:170; IV Piggyback:689.8]  Out: 1380 [Urine:1380]     Physical Exam:    General: intubated, somnolent  HEENT: normocephalic, atraumatic   CV: pulses palpable, RRR   Pulm: intubated, equal expansion  GI: soft, mildly distended  MSK: 2+ pitting edema noted to her knees bilaterally, no clubbing or cyanosis  Skin: numerous large flat violaceous hematomas on left arm, chest, neck bilaterally.   Neuro: Withdrawals to painful stimuli. Not responding to name or following commands.     Continuous Infusions:   Infusions Meds[1]    Scheduled Medications:   Scheduled Medications[2]    PRN medications:  PRN Medications[3]    Data/Imaging Review: Reviewed in Epic and personally interpreted on 03/29/2024. See EMR for detailed results.      Coleta Grosshans S Kristan Votta, MD  PGY1 Internal Medicine, [facility]                 [1]    heparin 14 Units/kg/hr (03/29/24 9780)    NORepinephrine bitartrate-NS Stopped (03/28/24 1357)   [2]    cyanocobalamin (vitamin B-12)  1,000 mcg Enteral tube: gastric Daily    flu vac 2025 [id number](PF)  0.5 mL Intramuscular During hospitalization    folic acid   1 mg Enteral tube: gastric Daily    lactulose   40 g Enteral tube: gastric 4x Daily    pantoprazole (Protonix) intravenous solution  40 mg Intravenous BID    thiamine  mononitrate (vit B1)  100 mg Enteral tube: gastric Daily   [3] acetaminophen , docusate sodium, fentaNYL  (PF) **OR** fentaNYL  (PF), heparin (porcine)

## 2024-03-30 LAB — CBC W/ AUTO DIFF
BASOPHILS ABSOLUTE COUNT: 0 10*9/L (ref 0.0–0.1)
BASOPHILS RELATIVE PERCENT: 0.2 %
EOSINOPHILS ABSOLUTE COUNT: 0.1 10*9/L (ref 0.0–0.5)
EOSINOPHILS RELATIVE PERCENT: 1.1 %
HEMATOCRIT: 28.8 % — ABNORMAL LOW (ref 34.0–44.0)
HEMOGLOBIN: 9.7 g/dL — ABNORMAL LOW (ref 11.3–14.9)
LYMPHOCYTES ABSOLUTE COUNT: 0.7 10*9/L — ABNORMAL LOW (ref 1.1–3.6)
LYMPHOCYTES RELATIVE PERCENT: 13.8 %
MEAN CORPUSCULAR HEMOGLOBIN CONC: 33.5 g/dL (ref 32.0–36.0)
MEAN CORPUSCULAR HEMOGLOBIN: 31.4 pg (ref 25.9–32.4)
MEAN CORPUSCULAR VOLUME: 93.9 fL (ref 77.6–95.7)
MEAN PLATELET VOLUME: 8.8 fL (ref 6.8–10.7)
MONOCYTES ABSOLUTE COUNT: 0.8 10*9/L (ref 0.3–0.8)
MONOCYTES RELATIVE PERCENT: 14.8 %
NEUTROPHILS ABSOLUTE COUNT: 3.7 10*9/L (ref 1.8–7.8)
NEUTROPHILS RELATIVE PERCENT: 70.1 %
PLATELET COUNT: 82 10*9/L — ABNORMAL LOW (ref 150–450)
RED BLOOD CELL COUNT: 3.07 10*12/L — ABNORMAL LOW (ref 3.95–5.13)
RED CELL DISTRIBUTION WIDTH: 21.7 % — ABNORMAL HIGH (ref 12.2–15.2)
WBC ADJUSTED: 5.2 10*9/L (ref 3.6–11.2)

## 2024-03-30 LAB — HEPATIC FUNCTION PANEL
ALBUMIN: 2.4 g/dL — ABNORMAL LOW (ref 3.4–5.0)
ALKALINE PHOSPHATASE: 94 U/L (ref 46–116)
ALT (SGPT): 23 U/L (ref 10–49)
AST (SGOT): 30 U/L (ref <=34)
BILIRUBIN DIRECT: 1 mg/dL — ABNORMAL HIGH (ref 0.00–0.30)
BILIRUBIN TOTAL: 1.9 mg/dL — ABNORMAL HIGH (ref 0.3–1.2)
PROTEIN TOTAL: 4.8 g/dL — ABNORMAL LOW (ref 5.7–8.2)

## 2024-03-30 LAB — BASIC METABOLIC PANEL
ANION GAP: 10 mmol/L (ref 5–14)
BLOOD UREA NITROGEN: 19 mg/dL (ref 9–23)
BUN / CREAT RATIO: 31
CALCIUM: 8.8 mg/dL (ref 8.7–10.4)
CHLORIDE: 110 mmol/L — ABNORMAL HIGH (ref 98–107)
CO2: 32 mmol/L — ABNORMAL HIGH (ref 20.0–31.0)
CREATININE: 0.61 mg/dL (ref 0.55–1.02)
EGFR CKD-EPI (2021) FEMALE: >90 mL/min/1.73m2 (ref >=60)
GLUCOSE RANDOM: 133 mg/dL (ref 70–179)
POTASSIUM: 3.5 mmol/L (ref 3.4–4.8)
SODIUM: 152 mmol/L — ABNORMAL HIGH (ref 135–145)

## 2024-03-30 LAB — MAGNESIUM: MAGNESIUM: 2.4 mg/dL (ref 1.6–2.6)

## 2024-03-30 LAB — SODIUM
SODIUM: 152 mmol/L — ABNORMAL HIGH (ref 135–145)
SODIUM: 151 mmol/L — ABNORMAL HIGH (ref 135–145)
SODIUM: 150 mmol/L — ABNORMAL HIGH (ref 135–145)

## 2024-03-30 LAB — PHOSPHORUS: PHOSPHORUS: 2.2 mg/dL — ABNORMAL LOW (ref 2.4–5.1)

## 2024-03-30 MED ADMIN — folic acid (FOLVITE) tablet 1 mg: 1 mg | GASTROENTERAL | @ 12:00:00

## 2024-03-30 MED ADMIN — thiamine mononitrate (vit B1) tablet 100 mg: 100 mg | GASTROENTERAL | @ 12:00:00

## 2024-03-30 MED ADMIN — cyanocobalamin (vitamin B-12) tablet 1,000 mcg: 1000 ug | GASTROENTERAL | @ 12:00:00

## 2024-03-30 MED ADMIN — pantoprazole (Protonix) oral suspension: 40 mg | GASTROENTERAL

## 2024-03-30 MED ADMIN — pantoprazole (Protonix) oral suspension: 40 mg | GASTROENTERAL | @ 12:00:00

## 2024-03-30 MED ADMIN — multivitamins, therapeutic with minerals tablet 1 tablet: 1 | GASTROENTERAL | @ 12:00:00

## 2024-03-30 MED ADMIN — lactulose oral solution: 40 g | GASTROENTERAL | @ 12:00:00

## 2024-03-30 MED ADMIN — lactulose oral solution: 40 g | GASTROENTERAL | @ 16:00:00

## 2024-03-30 MED ADMIN — lactulose oral solution: 40 g | GASTROENTERAL

## 2024-03-30 MED ADMIN — lactulose oral solution: 40 g | GASTROENTERAL | @ 20:00:00

## 2024-03-30 MED ADMIN — enoxaparin (LOVENOX) syringe 80 mg: 80 mg | SUBCUTANEOUS | @ 12:00:00

## 2024-03-30 MED ADMIN — enoxaparin (LOVENOX) syringe 80 mg: 80 mg | SUBCUTANEOUS

## 2024-03-30 MED ADMIN — potassium phosphate 30 mmol in sodium chloride (NS) 0.9 % 500 mL infusion: 30 mmol | INTRAVENOUS | @ 13:00:00 | Stop: 2024-03-30

## 2024-03-30 NOTE — Consults (Signed)
 PHYSICAL THERAPY  Re-Evaluation (03/30/24 0820)          Patient Name:  [name]       Medical Record Number: [id number]   Date of Birth: [birth year]  Sex: Female        Post-Discharge Physical Therapy Recommendations:  PT Post Acute Discharge Recommendations: Low intensity, 5x weekly   Equipment Recommendation  PT DME Recommendations: Defer to post acute                   ASSESSMENT  Problem List: Decreased mobility, Impaired balance, Urinary incontinence, Decreased endurance, Gait deviation, Fall risk, Decreased strength, Decreased cognition, Decreased safety awareness, Dizziness/vertigo, Decreased range of motion, Increased edema, Impaired ADLs, Decreased skin integrity      Assessment : [name] is a 78 y.o. female with HFpEF, HTN, Afib on AC, MASLD cirrhosis, originally hospitalized for scheduled TEE/DCCV (aborted d/t LAA clot) c/b AHHRF, encephalopathy and septic shock requiring MICU care, was s/p extubation transferred to [facility] for further management of anticoagulation and hypernatremia. Transferred back to [facility] MICU for closer monitoring for respiratory status, pressor requirement, GI bleed, and management of carotid artery injury.      On 10/24, patient had acute worsening of respiratory status leading requiring intubation. When patient was given sedation for intubation, she coded and promptly had ROSC after a few minutes of CPR. Subsequently, CVC placed in neck, used to draw blood, unclear if given pressors through it, then found  to be in arterial system based on blood gas and imaging, likely right carotid. Line was subsequently removed, and pressure was held for 15 minutes, hemostasis with no hematoma. She was transferred to [facility] MICU for management of this carotid injury, to be evaluated by vascular surgery while also managing her increased vasopressor requirement and respiratory distress.  Has been weaned off pressors since 10/27 at 1 PM. VBG's indicating that she is ready for extubation, however her mentation suggests that she is not able to protect her airway.     Pt presents to PT for re-evaluation s/p transfer to MICU/intubation/code. Pt currently not following commands and only opened eyes briefly during rolling for pericare. Pt will benefit from skilled acute PT to continue to assess mobility and address deficits. Recommend post-acute PT 5x/wk(low) at this time.      Today's Interventions: Re-eval, bed mobility, rolling for pericare, positioning for pressure relief and edema management as well as promoting upright, bed in chair position, PROM all extremities. Requested HUC order PRAFOs for BLE to prevent heel cord contractures.     Personal Factors/Comorbidities Present: 3+ factors   Examination of Body systems: 4+ elements  Clinical Presentation: Unstable    Eval Complexity : High Complexity     Activity Tolerance: Limited by Mental Status       PLAN  Planned Frequency of Treatment: Plan of Care Initiated: 03/30/24  1-2x per day Weekly Frequency: 2-3 days per week  Planned Treatment Duration: 04/13/24     Planned Interventions: Education (Patient/Family/Caregiver), Gait training, Home exercise program, Neuromuscular re-education, Self-care / Home Management training, Therapeutic Exercise, Therapeutic Activity     Goals:   Patient and Family Goals: not stated     SHORT GOAL #1: Pt will roll supine <> side-lying with modA.               Time Frame : 2 weeks  SHORT GOAL #2: Pt will participate in EOB/OOB mobility assessment.  Time Frame : 2 weeks                                         Long Term Goal #1: TBD pending OOB mobility assessment        Prognosis:  Guarded  Positive Indicators: PLOF  Barriers to Discharge: Inaccessible home environment, Decreased safety awareness, Endurance deficits, Cognitive deficits, Inability to safely perform ADLS, Functional strength deficits, Decreased range of motion, Severity of deficits     SUBJECTIVE  Communication Preference: Verbal     Patient reports: RN agreeable to PT. Pt non-communicative t/o session  Pain Comments: CPOT 0/8        Prior Functional Status: Per PT eval 10/3: IND at home for mobility and self care, lives with spouse, daughter, and granddaughter. Daughter and granddaughter can assist as needed. Mostly sedentary, does not leave the house much. Denies recent falls  Living Situation  Living Environment: House  Lives With: Spouse, Daughter, Family  Home Living: Multi-level home, Walk-in shower, Raised toilet seat without rails, Stairs to enter with rails, Stairs to alternate level with rails, Built-in shower seat  Rail placement (outside): Bilateral rails in reach  Number of Stairs to Enter (outside): 9  Rail placement (inside): Bilateral rails in reach  Number of Stairs to Alternate level (inside):  (needs clarification)      Equipment available at home: Rollator, Rolling walker        Past Medical History[1]         Social History     Tobacco Use    Smoking status: Never     Passive exposure: Past    Smokeless tobacco: Never   Substance Use Topics    Alcohol use: Not Currently       Past Surgical History[2]          Family History[3]     Allergies: Sulfa (sulfonamide antibiotics), Sulfur , and Aspirin                  Objective Findings  Precautions / Restrictions  Precautions: Falls precautions, Delirium Precautions  Weight Bearing Status: Non-applicable  Required Braces or Orthoses: Non-applicable     Medical Tests / Procedures: vitals, labs, orders, imaging reviewed in Epic  Equipment / Environment: Vascular access (PIV, TLC, Port-a-cath, PICC), Telemetry, Ventilatory support, Foley, NGT, Restraints, Supplemental oxygen     Vitals/Orthostatics : VSS on vent, 30% FiO2     Cognition: Unable to follow commands  Cognition comment: opened eyes during bed mobility briefly, not following commands or arousing to stimuli otherwise  Visual/Perception: Wears Glasses/Contacts all the time  Hearing:  (unable to assess)     Skin Inspection: Bruising, Poor skin integrity, Swelling  Skin Inspection comment: scattered ecchymosis and edema in extremities     Upper Extremities  UE ROM: Right WFL, Left WFL  UE Strength: Left Impaired/Limited, Right Impaired/Limited  RUE Strength Impairment: Reduced strength  LUE Strength Impairment: Reduced strength  UE comment: no active movement in UEs observed    Lower Extremities  LE ROM: Left Impaired/Limited, Right WFL within precautions  LLE ROM Impairment: Limited PROM  LE Strength: Right Impaired/Limited, Left Impaired/Limited  RLE Strength Impairment: Reduced strength  LLE Strength Impairment: Reduced strength  LE comment: L ankle DF limited to neutral, no active movement in LEs observed          Coordination: Not tested  Proprioception: Not tested  Sensation: Not tested    Sitting Balance comments: unable to progress to EOB    Standing Balance comments: unable to progress to OOB      Bed Mobility comments: totalAx2 to roll L and R, totalAx2 to boost and reposition     Transfer comments: unable to progress to EOB/OOB      Gait Distance Ambulated (ft): 0 ft  Skilled Treatment Performed: unable to progress to EOB/OOB                  Endurance: decreased         Physical Therapy Session Duration  PT Individual [mins]: 30          AM-PAC-6 click  Help currently need turning over In bed?: Unable to do/total assistance - Total Dependent Assist  Help currently needed sitting down/standing up from chair with arms? : Unable to do/total assistance - Total Dependent Assist  Help currently needed moving from supine to sitting on edge of bed?: Unable to do/total assistance - Total Dependent Assist  Help currently needed moving to and from bed from wheelchair?: Unable to do/total assistance - Total Dependent Assist  Help currently needed walking in a hospital room?: Unable to do/total assistance - Total Dependent Assist  Help currently needed climbing 3-5 steps with railing?: Unable to do/total assistance - Total Dependent Assist    Basic Mobility Score 6 click: 6    6 click Score (in points): % of Functional Impairment, Limitation, Restriction  6: 100% impaired, limited, restricted  7-8: At least 80%, but less than 100% impaired, limited restricted  9-13: At least 60%, but less than 80% impaired, limited restricted  14-19: At least 40%, but less than 60% impaired, limited restricted  20-22: At least 20%, but less than 40% impaired, limited restricted  23: At least 1%, but less than 20% impaired, limited restricted  24: 0% impaired, limited restricted    'AM-PAC' forms are Copyright protected by [employer] [facility]         I attest that I have reviewed the above information.  Signed: Augustin SHAUNNA Lighter, PT  Filed 03/30/2024          [1]   Past Medical History:  Diagnosis Date    A-fib (CMS-HCC)     Alcoholism    (CMS-HCC)     Alcoholism /alcohol abuse     Cirrhosis    (CMS-HCC)     Depression     Hypertension     Liver disease     Malignant neoplasm of overlapping sites of right breast in female, estrogen receptor positive    (CMS-HCC) 10/03/2020   [2]   Past Surgical History:  Procedure Laterality Date    BREAST BIOPSY Right     benign-a long time ago    BREAST BIOPSY Right 07/2020    malignant    BREAST LUMPECTOMY Right     4 2022    CENTRAL LINE  03/25/2024    CHOLECYSTECTOMY      PR BX/REMV,LYMPH NODE,DEEP AXILL Right 09/03/2020    Procedure: BX/EXC LYMPH NODE; OPEN, DEEP AXILRY NODE;  Surgeon: Alm Elsie Como, MD;  [facility];  Service: Surgical Oncology Breast    PR CARDIOVERSION, ELECTIVE;EXTERN N/A 03/10/2024    Procedure: CARDIOVERSION, ELECTIVE, ELECTRICAL CONVERSION OF ARRHYTHMIA; EXTERNAL;  Surgeon: Sedalia Velma Hamilton, MD;  Location: [facility];  Service: Cardiology    PR ECHO HEART,TRANSESOPHAGEAL,COMPLETE Midline 03/10/2024    Procedure: ECHOCARDIOGRAPHY, TRANSESOPHAGEAL, REAL-TIME  WITH IMAGE DOCUMENTATION;  Surgeon: Sedalia Velma Hamilton, MD;  Location: [facility] OR [facility];  Service: Cardiology    PR INTRAOPERATIVE SENTINEL LYMPH NODE ID W DYE INJECTION Right 09/03/2020    Procedure: INTRAOPERATIVE IDENTIFICATION SENTINEL LYMPH NODE(S) INCLUDE INJECTION NON-RADIOACTIVE DYE, WHEN PERFORMED;  Surgeon: Alm Elsie Como, MD;  Location: [facility];  Service: Surgical Oncology Breast    PR MASTECTOMY, PARTIAL Right 09/03/2020    Procedure: MASTECTOMY, PARTIAL (EG, LUMPECTOMY, TYLECTOMY, QUADRANTECTOMY, SEGMENTECTOMY);  Surgeon: Alm Elsie Como, MD;  Location: [facility];  Service: Surgical Oncology Breast    PR UPPER GI ENDOSCOPY,BIOPSY N/A 02/03/2018    Procedure: UGI ENDOSCOPY; WITH BIOPSY, SINGLE OR MULTIPLE;  Surgeon: Eleanor Dewey Sorrel, MD;  Location: [facility];  Service: Gastroenterology    RADIATION Right     unsure finish date    TUBAL LIGATION     [3]   Family History  Problem Relation Age of Onset    Heart disease Mother     No Known Problems Father     No Known Problems Sister     No Known Problems Daughter     No Known Problems Maternal Grandmother     No Known Problems Maternal Grandfather     No Known Problems Paternal Grandmother     No Known Problems Paternal Grandfather     Lupus Brother     No Known Problems Other     BRCA 1/2 Neg Hx     Breast cancer Neg Hx     Cancer Neg Hx     Colon cancer Neg Hx     Endometrial cancer Neg Hx     Ovarian cancer Neg Hx     Mental illness Neg Hx     Substance Abuse Disorder Neg Hx

## 2024-03-30 NOTE — Plan of Care (Signed)
 UTA pt orientation. Pt remains somnolent, RASS -3. Pt beginning to open eyes partial to voice by end of shift. Still unable to follow commands. VSS. On vent. Large amount of secretions. Afebrile. No evidence of pain per CPOT. Foley in place, URO adequate. Multiple BM this shift, pt getting scheduled lactulose . Q2H turns and standard precautions maintained. No falls/injuries this shift. Family at bedside. See MAR/flowsheets for further information.    Problem: Skin Injury Risk Increased  Goal: Skin Health and Integrity  Outcome: Shift Focus  Intervention: Optimize Skin Protection  Recent Flowsheet Documentation  Taken 03/30/2024 1800 by Edsel Silvano BROCKS, RN  Head of Bed [facility]) Positioning: HOB at 30-45 degrees  Taken 03/30/2024 1600 by Edsel Silvano BROCKS, RN  Head of Bed [facility]) Positioning: HOB at 30-45 degrees  Taken 03/30/2024 1400 by Edsel Silvano BROCKS, RN  Head of Bed [facility]) Positioning: HOB at 30-45 degrees  Taken 03/30/2024 1200 by Edsel Silvano BROCKS, RN  Head of Bed [facility]) Positioning: HOB at 30-45 degrees  Taken 03/30/2024 1000 by Edsel Silvano BROCKS, RN  Head of Bed [facility]) Positioning: HOB at 30-45 degrees  Taken 03/30/2024 0800 by Edsel Silvano BROCKS, RN  Pressure Reduction Techniques:   heels elevated off bed   weight shift assistance provided  Head of Bed (HOB) Positioning: HOB at 30-45 degrees  Pressure Reduction Devices:   heel offloading device utilized   positioning supports utilized   pressure-redistributing mattress utilized  Skin Protection:   adhesive use limited   incontinence pads utilized   skin-to-device areas padded   skin-to-skin areas padded   transparent dressing maintained   tubing/devices free from skin contact     Problem: Adult Inpatient Plan of Care  Goal: Optimal Comfort and Wellbeing  Outcome: Shift Focus     Problem: Fall Injury Risk  Goal: Absence of Fall and Fall-Related Injury  Outcome: Shift Focus  Intervention: Promote Injury-Free Environment  Recent Flowsheet Documentation  Taken 03/30/2024 0800 by Edsel Silvano BROCKS, RN  Safety Interventions:   aspiration precautions   bed alarm   enteral feeding safety   fall reduction program maintained   infection management   room near unit station     Problem: Gas Exchange Impaired  Goal: Optimal Gas Exchange  Outcome: Shift Focus  Intervention: Optimize Oxygenation and Ventilation  Recent Flowsheet Documentation  Taken 03/30/2024 1800 by Edsel Silvano BROCKS, RN  Head of Bed [facility]) Positioning: HOB at 30-45 degrees  Taken 03/30/2024 1600 by Edsel Silvano BROCKS, RN  Head of Bed [facility]) Positioning: HOB at 30-45 degrees  Taken 03/30/2024 1400 by Edsel Silvano BROCKS, RN  Head of Bed [facility]) Positioning: HOB at 30-45 degrees  Taken 03/30/2024 1200 by Edsel Silvano BROCKS, RN  Head of Bed [facility]) Positioning: HOB at 30-45 degrees  Taken 03/30/2024 1000 by Edsel Silvano BROCKS, RN  Head of Bed [facility]) Positioning: HOB at 30-45 degrees  Taken 03/30/2024 0800 by Edsel Silvano BROCKS, RN  Head of Bed [facility]) Positioning: HOB at 30-45 degrees     Problem: Non-Violent Restraints  Goal: Patient will remain free of restraint events  Outcome: Shift Focus     Problem: Adult Inpatient Plan of Care  Goal: Absence of Hospital-Acquired Illness or Injury  Intervention: Identify and Manage Fall Risk  Recent Flowsheet Documentation  Taken 03/30/2024 0800 by Edsel Silvano BROCKS, RN  Safety Interventions:   aspiration precautions   bed alarm   enteral feeding safety   fall reduction program maintained   infection management   room  near unit station  Intervention: Prevent Skin Injury  Recent Flowsheet Documentation  Taken 03/30/2024 1800 by Edsel Silvano BROCKS, RN  Positioning for Skin: Left  Taken 03/30/2024 1600 by Edsel Silvano BROCKS, RN  Positioning for Skin: Right  Taken 03/30/2024 1400 by Edsel Silvano BROCKS, RN  Positioning for Skin: Left  Taken 03/30/2024 1200 by Edsel Silvano BROCKS, RN  Positioning for Skin: Right  Taken 03/30/2024 1000 by Edsel Silvano BROCKS, RN  Positioning for Skin: Left  Taken 03/30/2024 0800 by Edsel Silvano BROCKS, RN  Positioning for Skin: Right  Device Skin Pressure Protection:   absorbent pad utilized/changed   adhesive use limited   positioning supports utilized   pressure points protected  Skin Protection:   adhesive use limited   incontinence pads utilized   skin-to-device areas padded   skin-to-skin areas padded   transparent dressing maintained   tubing/devices free from skin contact  Taken 03/30/2024 0600 by Edsel Silvano BROCKS, RN  Positioning for Skin: Left  Intervention: Prevent Infection  Recent Flowsheet Documentation  Taken 03/30/2024 0800 by Edsel Silvano BROCKS, RN  Infection Prevention:   hand hygiene promoted   personal protective equipment utilized   rest/sleep promoted   single patient room provided     Problem: Breathing Pattern Ineffective  Goal: Effective Breathing Pattern  Intervention: Promote Improved Breathing Pattern  Recent Flowsheet Documentation  Taken 03/30/2024 1800 by Edsel Silvano BROCKS, RN  Head of Bed [facility]) Positioning: HOB at 30-45 degrees  Taken 03/30/2024 1600 by Edsel Silvano BROCKS, RN  Head of Bed [facility]) Positioning: HOB at 30-45 degrees  Taken 03/30/2024 1400 by Edsel Silvano BROCKS, RN  Head of Bed [facility]) Positioning: HOB at 30-45 degrees  Taken 03/30/2024 1200 by Edsel Silvano BROCKS, RN  Head of Bed [facility]) Positioning: HOB at 30-45 degrees  Taken 03/30/2024 1000 by Edsel Silvano BROCKS, RN  Head of Bed [facility] Positioning: HOB at 30-45 degrees  Taken 03/30/2024 0800 by Edsel Silvano BROCKS, RN  Head of Bed [facility]) Positioning: HOB at 30-45 degrees     Problem: Non-Violent Restraints  Intervention: Utilize least restrictive measures  Recent Flowsheet Documentation  Taken 03/30/2024 1800 by Edsel Silvano BROCKS, RN  Less Restrictive Alternative: 1:1 patient care  Taken 03/30/2024 1600 by Edsel Silvano BROCKS, RN  Less Restrictive Alternative: 1:1 patient care  Taken 03/30/2024 1400 by Edsel Silvano BROCKS, RN  Less Restrictive Alternative: 1:1 patient care  Taken 03/30/2024 1200 by Edsel Silvano BROCKS, RN  Less Restrictive Alternative: 1:1 patient care  Taken 03/30/2024 1000 by Edsel Silvano BROCKS, RN  Less Restrictive Alternative:   1:1 patient care   Repositioning  Taken 03/30/2024 0800 by Edsel Silvano BROCKS, RN  Less Restrictive Alternative:   1:1 patient care   Repositioning  Intervention: Patient Monitoring  Recent Flowsheet Documentation  Taken 03/30/2024 1800 by Edsel Silvano BROCKS, RN  Psychological Status/Visual Check: Subdued  Circulation/Skin Integrity: No signs of injury  Range of Motion: Performed  Fluids: NPO  Food/Meal: Enteral feeding/TPN  Elimination: Urinary catheter  Taken 03/30/2024 1600 by Edsel Silvano BROCKS, RN  Psychological Status/Visual Check: Subdued  Circulation/Skin Integrity: No signs of injury  Range of Motion: Performed  Fluids: NPO  Food/Meal: Enteral feeding/TPN  Elimination: Urinary catheter  Taken 03/30/2024 1400 by Edsel Silvano BROCKS, RN  Psychological Status/Visual Check: Subdued  Circulation/Skin Integrity: No signs of injury  Range of Motion: Performed  Fluids: NPO  Food/Meal: Enteral feeding/TPN  Elimination: Urinary catheter  Taken 03/30/2024 1200 by Edsel Silvano BROCKS, RN  Psychological Status/Visual Check: Subdued  Circulation/Skin Integrity: No signs of injury  Range of Motion: Performed  Fluids: NPO  Food/Meal: Enteral feeding/TPN  Elimination: Urinary catheter  Taken 03/30/2024 1000 by Edsel Silvano BROCKS, RN  Psychological Status/Visual Check: Subdued  Circulation/Skin Integrity: No signs of injury  Range of Motion: Performed  Fluids: NPO  Food/Meal: Enteral feeding/TPN  Elimination: Urinary catheter  Taken 03/30/2024 0800 by Edsel Silvano BROCKS, RN  Psychological Status/Visual Check: Subdued  Circulation/Skin Integrity: No signs of injury  Range of Motion: Performed  Fluids: NPO  Food/Meal: Enteral feeding/TPN  Elimination: Urinary catheter     Problem: Wound  Goal: Absence of Infection Signs and Symptoms  Intervention: Prevent or Manage Infection  Recent Flowsheet Documentation  Taken 03/30/2024 0800 by Edsel Silvano BROCKS, RN  Infection Management: aseptic technique maintained  Goal: Skin Health and Integrity  Intervention: Optimize Skin Protection  Recent Flowsheet Documentation  Taken 03/30/2024 1800 by Edsel Silvano BROCKS, RN  Head of Bed [facility]) Positioning: HOB at 30-45 degrees  Taken 03/30/2024 1600 by Edsel Silvano BROCKS, RN  Head of Bed [facility]) Positioning: HOB at 30-45 degrees  Taken 03/30/2024 1400 by Edsel Silvano BROCKS, RN  Head of Bed [facility]) Positioning: HOB at 30-45 degrees  Taken 03/30/2024 1200 by Edsel Silvano BROCKS, RN  Head of Bed [facility]) Positioning: HOB at 30-45 degrees  Taken 03/30/2024 1000 by Edsel Silvano BROCKS, RN  Head of Bed [facility]) Positioning: HOB at 30-45 degrees  Taken 03/30/2024 0800 by Edsel Silvano BROCKS, RN  Pressure Reduction Techniques:   heels elevated off bed   weight shift assistance provided  Head of Bed (HOB) Positioning: HOB at 30-45 degrees  Pressure Reduction Devices:   heel offloading device utilized   positioning supports utilized   pressure-redistributing mattress utilized  Skin Protection:   adhesive use limited   incontinence pads utilized   skin-to-device areas padded   skin-to-skin areas padded   transparent dressing maintained   tubing/devices free from skin contact     Problem: Mechanical Ventilation Invasive  Goal: Optimal Device Function  Intervention: Optimize Device Care and Function  Recent Flowsheet Documentation  Taken 03/30/2024 1600 by Edsel Silvano BROCKS, RN  Oral Care:   mouth swabbed   suction provided  Taken 03/30/2024 1200 by Edsel Silvano BROCKS, RN  Oral Care:   mouth swabbed   suction provided  Goal: Absence of Device-Related Skin and Tissue Injury  Intervention: Maintain Skin and Tissue Health  Recent Flowsheet Documentation  Taken 03/30/2024 0800 by Edsel Silvano BROCKS, RN  Device Skin Pressure Protection:   absorbent pad utilized/changed   adhesive use limited   positioning supports utilized   pressure points protected  Goal: Absence of Ventilator-Induced Lung Injury  Intervention: Prevent Ventilator-Associated Pneumonia  Recent Flowsheet Documentation  Taken 03/30/2024 1800 by Edsel Silvano BROCKS, RN  Head of Bed [facility]) Positioning: HOB at 30-45 degrees  Taken 03/30/2024 1600 by Edsel Silvano BROCKS, RN  Head of Bed [facility]) Positioning: HOB at 30-45 degrees  Oral Care:   mouth swabbed   suction provided  Taken 03/30/2024 1400 by Edsel Silvano BROCKS, RN  Head of Bed [facility]) Positioning: HOB at 30-45 degrees  Taken 03/30/2024 1200 by Edsel Silvano BROCKS, RN  Head of Bed [facility]) Positioning: HOB at 30-45 degrees  Oral Care:   mouth swabbed   suction provided  Taken 03/30/2024 1000 by Edsel Silvano BROCKS, RN  Head of Bed [facility]) Positioning: HOB at 30-45 degrees  Taken 03/30/2024 0800 by Edsel Silvano BROCKS, RN  Head of Bed (HOB) Positioning: HOB at 30-45 degrees     Problem: Mechanical Ventilation Invasive  Goal: Optimal Device Function  Intervention: Optimize Device Care and Function  Recent Flowsheet Documentation  Taken 03/30/2024 1600 by Edsel Silvano BROCKS, RN  Oral Care:   mouth swabbed   suction provided  Taken 03/30/2024 1200 by Edsel Silvano BROCKS, RN  Oral Care:   mouth swabbed   suction provided  Goal: Absence of Device-Related Skin and Tissue Injury  Intervention: Maintain Skin and Tissue Health  Recent Flowsheet Documentation  Taken 03/30/2024 0800 by Edsel Silvano BROCKS, RN  Device Skin Pressure Protection:   absorbent pad utilized/changed   adhesive use limited   positioning supports utilized   pressure points protected  Goal: Absence of Ventilator-Induced Lung Injury  Intervention: Prevent Ventilator-Associated Pneumonia  Recent Flowsheet Documentation  Taken 03/30/2024 1800 by Edsel Silvano BROCKS, RN  Head of Bed [facility]) Positioning: HOB at 30-45 degrees  Taken 03/30/2024 1600 by Edsel Silvano BROCKS, RN  Head of Bed [facility]) Positioning: HOB at 30-45 degrees  Oral Care:   mouth swabbed   suction provided  Taken 03/30/2024 1400 by Edsel Silvano BROCKS, RN  Head of Bed [facility]) Positioning: HOB at 30-45 degrees  Taken 03/30/2024 1200 by Edsel Silvano BROCKS, RN  Head of Bed [facility]) Positioning: HOB at 30-45 degrees  Oral Care:   mouth swabbed   suction provided  Taken 03/30/2024 1000 by Edsel Silvano BROCKS, RN  Head of Bed [facility]) Positioning: HOB at 30-45 degrees  Taken 03/30/2024 0800 by Edsel Silvano BROCKS, RN  Head of Bed [facility]) Positioning: HOB at 30-45 degrees     Problem: Mechanical Ventilation Invasive  Goal: Optimal Device Function  Intervention: Optimize Device Care and Function  Recent Flowsheet Documentation  Taken 03/30/2024 1600 by Edsel Silvano BROCKS, RN  Oral Care:   mouth swabbed   suction provided  Taken 03/30/2024 1200 by Edsel Silvano BROCKS, RN  Oral Care:   mouth swabbed   suction provided  Goal: Absence of Device-Related Skin and Tissue Injury  Intervention: Maintain Skin and Tissue Health  Recent Flowsheet Documentation  Taken 03/30/2024 0800 by Edsel Silvano BROCKS, RN  Device Skin Pressure Protection:   absorbent pad utilized/changed   adhesive use limited   positioning supports utilized   pressure points protected  Goal: Absence of Ventilator-Induced Lung Injury  Intervention: Prevent Ventilator-Associated Pneumonia  Recent Flowsheet Documentation  Taken 03/30/2024 1800 by Edsel Silvano BROCKS, RN  Head of Bed [facility]) Positioning: HOB at 30-45 degrees  Taken 03/30/2024 1600 by Edsel Silvano BROCKS, RN  Head of Bed [facility]) Positioning: HOB at 30-45 degrees  Oral Care:   mouth swabbed   suction provided  Taken 03/30/2024 1400 by Edsel Silvano BROCKS, RN  Head of Bed [facility]) Positioning: HOB at 30-45 degrees  Taken 03/30/2024 1200 by Edsel Silvano BROCKS, RN  Head of Bed [facility]) Positioning: HOB at 30-45 degrees  Oral Care:   mouth swabbed   suction provided  Taken 03/30/2024 1000 by Edsel Silvano BROCKS, RN  Head of Bed [facility]) Positioning: HOB at 30-45 degrees  Taken 03/30/2024 0800 by Edsel Silvano BROCKS, RN  Head of Bed [facility]) Positioning: HOB at 30-45 degrees     Problem: Mechanical Ventilation Invasive  Goal: Optimal Device Function  Intervention: Optimize Device Care and Function  Recent Flowsheet Documentation  Taken 03/30/2024 1600 by Edsel Silvano BROCKS, RN  Oral Care:   mouth swabbed   suction provided  Taken 03/30/2024 1200 by Edsel Silvano  C, RN  Oral Care:   mouth swabbed   suction provided  Goal: Absence of Device-Related Skin and Tissue Injury  Intervention: Maintain Skin and Tissue Health  Recent Flowsheet Documentation  Taken 03/30/2024 0800 by Edsel Silvano BROCKS, RN  Device Skin Pressure Protection:   absorbent pad utilized/changed   adhesive use limited   positioning supports utilized   pressure points protected  Goal: Absence of Ventilator-Induced Lung Injury  Intervention: Prevent Ventilator-Associated Pneumonia  Recent Flowsheet Documentation  Taken 03/30/2024 1800 by Edsel Silvano BROCKS, RN  Head of Bed [facility]) Positioning: HOB at 30-45 degrees  Taken 03/30/2024 1600 by Edsel Silvano BROCKS, RN  Head of Bed [facility]) Positioning: HOB at 30-45 degrees  Oral Care:   mouth swabbed   suction provided  Taken 03/30/2024 1400 by Edsel Silvano BROCKS, RN  Head of Bed [facility]) Positioning: HOB at 30-45 degrees  Taken 03/30/2024 1200 by Edsel Silvano BROCKS, RN  Head of Bed [facility]) Positioning: HOB at 30-45 degrees  Oral Care:   mouth swabbed   suction provided  Taken 03/30/2024 1000 by Edsel Silvano BROCKS, RN  Head of Bed [facility]) Positioning: HOB at 30-45 degrees  Taken 03/30/2024 0800 by Edsel Silvano BROCKS, RN  Head of Bed [facility]) Positioning: HOB at 30-45 degrees

## 2024-03-30 NOTE — Plan of Care (Signed)
 Pt received on PS, no complications throughout shift. ETT found at 21 lips instead of documented placement of 23 lips. Provider made aware, repeat xray done and ok with leaving ETT at 21 lips. ETT secure and patent. Care ongoing.  Problem: Mechanical Ventilation Invasive  Goal: Effective Communication  Outcome: Ongoing - Unchanged  Goal: Optimal Device Function  Outcome: Ongoing - Unchanged  Goal: Mechanical Ventilation Liberation  Outcome: Ongoing - Unchanged  Goal: Optimal Nutrition Delivery  Outcome: Ongoing - Unchanged  Goal: Absence of Device-Related Skin and Tissue Injury  Outcome: Ongoing - Unchanged  Goal: Absence of Ventilator-Induced Lung Injury  Outcome: Ongoing - Unchanged

## 2024-03-30 NOTE — Progress Notes (Signed)
 MICU Daily Progress Note     Date of Service: 03/30/2024    Problem List:   Principal Problem:    Bacteremia, escherichia coli  Active Problems:    Alcoholic liver disease (HHS-HCC)    HTN (hypertension)    Depression with anxiety    Class 2 severe obesity with serious comorbidity in adult    Atrial fibrillation    (CMS-HCC)    Pleural effusion    Hypernatremia    Thrombocytopenia    Cirrhosis    (CMS-HCC)    A-fib (CMS-HCC)    Hepatic encephalopathy    (CMS-HCC)      Interval history: [name] is a 78 y.o. female with HFpEF, HTN, Afib on AC, MASLD cirrhosis, originally hospitalized for scheduled TEE/DCCV (aborted d/t LAA clot) c/b AHHRF, encephalopathy and septic shock requiring MICU care, was s/p extubation transferred to [facility] for further management of anticoagulation and hypernatremia. Transferred back to [facility] MICU for closer monitoring for respiratory status, pressor requirement, GI bleed, and management of carotid artery injury.     On 10/24, patient had acute worsening of respiratory status leading requiring intubation. When patient was given sedation for intubation, she coded and promptly had ROSC after a few minutes of CPR. Subsequently, CVC placed in neck, used to draw blood, unclear if given pressors through it, then found  to be in arterial system based on blood gas and imaging, likely right carotid. Line was subsequently removed, and pressure was held for 15 minutes, hemostasis with no hematoma. She was transferred to [facility] MICU for management of this carotid injury, to be evaluated by vascular surgery while also managing her increased vasopressor requirement and respiratory distress.  Has been weaned off pressors since 10/27 at 1 PM. VBG's indicating that she is ready for extubation, however her mentation suggests that she is not able to protect her airway    24 hour events:  - 24+ hours without pressor support  - Resumed tube feeds  - Passing SBTs. Still not safe for extubation based on mentation.  - Continuing lactulose  and assess for improving mentation     Neurological   Analgosedation  Altered mental status with history of hepatic encephalopathy  Per chart review at [facility], rapid response called on 10/24 for increased somnolence. At the time, differential included hepatic encephalopathy, toxic metabolic encephalopathy in the setting of shock (possibly sepsis), versus intracranial pathology. She was subsequently intubated. CT head on 10/24 showed no acute intracranial abnormality. Weaned off sedation with slow improvements in neurological status.  Plan to extubate when mental status improves.  - Goal RASS 0 to -1  - Continue lactulose   - treating Hypernatremia as below which may also be contributing    Analgesia: No pain issues  RASS at goal? Yes  [place] Agitation Assessment Scale (RASS) : -1 (03/30/2024  4:00 AM)    Pulmonary   Acute Hypoxic Hypercarbic Respiratory Failure c/f septic shock   Rapid response called on 10/24 for increased somnolence, found to have acute hypercarbic respiratory failure and subsequently intubated. Suspect hypercarbic respiratory failure due to encephalopathy as above. CXR on 10/24 showed worsening pleural effusions. Not currently getting diuresed. Given patient's hypoxia, and AMS, there was concern for infectious etiology. MRSA nares negative. Infectious workup thus far is unremarkable. SBTs show readiness extubation based on VBGs, however there is concern that she continues to be unable to protect her airway due to her current mentation   - S/p Cefepime & Vanc, not currently on antibiotics   -  Extubate pending improving mentation   - LRCx with OP flora only    FiO2 (%):  [30 %] 30 %  PC Set:  [15] 15  PR SUP:  [5 cm H20-10 cm H20] 5 cm H20  O2 Device: Ventilator    Cardiovascular   Carotid Artery Injury (resolved)  At [facility], CVC placed intended for RIJ on 10/24 following intubation, PEA, ROSC. Was used with known blood return. Unclear if it was used for pressor support, but highly likely that it was. CXR for placement check showed concern for intra aterial location, and blood gas confirmed it was arterial. Thought to be in carotid artery. CVC removed with pressure held for 15 minutes, with no hematoma formation. On arrival, no physical exam and bedside ultrasound showed no large hematoma. No active bleeding.   - Vascular Surgery consulted, stating fistula has healed based on Ultrasound findings    PEA arrest s/p ROSC  Hypovolemic/Hemorrhagic shock (resolved)  Atrial fibrillation  known left atrial appendage clot  HFpEF  Patient with PEA arrest peri-intubation with ROSC. Shock thought to be hemorrhagic in the setting of GI bleed History of atrial fibrillation with known LAA clot and HFpEF. Vasopressors were weaned off 10/27. APTT was elevated the morning of 10/28 necessitating changing anticoagulation from therapeutic heparin to therapeutic Lovenox .   - Hold Coreg , eliquis, GDMT given hemodynamic instability, consider restarting once more clinically stable  - Remains intermittently NE  - Continue anticoagulation with therapeutic lovenox for LAA clot; following anti-Xa for therapeutic efficacy    Renal   Hypernatremia  At [facility], had persistently hypernatremia near 153. Had been treated with D5 gtt. Sodium throughout admission has largely been 146-148. Sodium was 151 morning of 10/28, presume this will decrease once we start tube feeds.   - Strict I/O's  - Sodium check q6hours   - FWF with TF, adjusting as needed    Infectious Disease/Autoimmune   E. Coli Bacteremia (resolved),   Initially admitted for afib, found to have E. coli bacteremia on October 10. This was treated with cefazolin . Rapid response called 10/24, patient noted to be hypothermic. WBCs jumped from 3.0 to 9.7 on 10/24. Although the most recent lab was following intubation and CPR. UA with pyuria, rare bacteria, hematuria. Peri-intubation, patient developed significant hypotension requiring initiation of NE and vasopressin. Suspect possible septic shock with unknown infectious source. Patient started on vancomycin and cefepime 10/24. CT Abdomen pelvis concerning for aspiration pneumonia. Following infectious workup was unremarkable. Antibiotics were stopped on 10/27.   - stopping cefepime; stopped Vanc  - follow blood, urine, lower resp culture    Cultures:  Blood Culture, Routine (no units)   Date Value   03/25/2024 No Growth at 4 days   03/25/2024 No Growth at 4 days     Urine Culture, Comprehensive (no units)   Date Value   03/25/2024 <10,000 CFU/mL     Lower Respiratory Culture (no units)   Date Value   03/29/2024 Specimen Not Processed   03/27/2024 OROPHARYNGEAL FLORA ISOLATED     WBC (10*9/L)   Date Value   03/30/2024 5.2     WBC, UA (/HPF)   Date Value   03/25/2024 25 (H)       FEN/GI   Sigmoid Mass c/f Malignancy and Hematochezia  Hemorrhagic Shock  CT abdomen pelvis done 10/24 with evidence of cirrhosis and masslike thickening of the lower sigmoid colon concerning for malignancy.  CTA abdomen pelvis performed 1024 with active extravasation into the gastric fundus  possibly secondary to traumatic enteric tube placement.  GI scoped the patient on 10/25 and clipped an area of active bleeding.  They removed a total of 1.5 L of blood.  Hemoglobin 10/20 6 AM downtrended to 6.0, so 2 units of blood transfused.  GI stated this was consistent with 10/25 scope and likely did not represent increased bleeding. Hb steadily improving with Hb of 9.2 on 10/28.   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 Given shock of unclear etiology. CTAP demonstrating cirrhosis. Likely that cirrhosis is contributing to hypotension.   - Continue lactulose ; target 3-5 BMs daily  - NGT in place  - Daily MELD labs and CMP  - clotting factors     MELD 3.0: 14 at 03/27/2024  5:19 AM  MELD-Na: 11 at 03/27/2024  5:19 AM  Calculated from:  Serum Creatinine: 0.99 mg/dL (Using min of 1 mg/dL) at 89/73/7974  4:80 AM  Serum Sodium: 146 mmol/L (Using max of 137 mmol/L) at 03/27/2024  5:19 AM  Total Bilirubin: 2.2 mg/dL at 89/73/7974  4:80 AM  Serum Albumin: 2.8 g/dL at 89/73/7974  4:80 AM  INR(ratio): 1.19 at 03/25/2024 10:27 PM  Age at listing (hypothetical): 78 years  Sex: Female at 03/27/2024  5:19 AM    Provider Malnutrition Assessment:  Body mass index is 34.8 kg/m??.BMI Interpretation: >/= 30 and < 40, consistent with obesity, clinically significant requiring additional resources and complicating multiple aspects of patient care.  GLIM criteria:   Pt does not meet criteria  -I have screened this patient for malnutrition and they did NOT meet criteria for malnutrition based on GLIM criteria.  -TF and FWF; Dietician consulted, appreciate assistance    Heme/Coag   Acute blood loss anemia, resolving  Hemoglobin down trended to 6.0 on the morning of 10/26 thought to be secondary to acute GI bleed that was scoped and clipped 10/25. Patient received 2 units PRBC 10/26 and an additional unit 10/27. Hb steadily improving with Hb of 9.2 on 10/28.   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 Reason: Not indicated.    Prophylaxis/LDA/Restraints/Consults   ICU Checklist completed: yes (see ICU rounding navigator in Epic)    Patient Lines/Drains/Airways Status       Active Active Lines, Drains, & Airways       Name Placement date Placement time Site Days    ETT  7.5 03/25/24  1317  -- 4    CVC Triple Lumen 03/25/24 Non-tunneled Left Internal jugular 03/25/24  2316  Internal jugular  4    NG/OG Tube 18 Fr. Center mouth 03/25/24  1800  Center mouth  4    Urethral Catheter Temperature probe;Non-latex 03/25/24  1354  Temperature probe;Non-latex  4    Peripheral IV 03/20/24 Anterior;Left;Upper Arm 03/20/24  1730  Arm  9    Peripheral IV 03/25/24 Left;Posterior Hand 03/25/24  1950  Hand  4                  Patient Lines/Drains/Airways Status       Active Wounds  Name Placement date Placement time Site Days    Wound 03/01/24 Other (Comment) Toe (Comment which one) Left;Other (Comment) Blister present on arrival, tx already in outpatient setting, in process of healing, surrounding erythema 03/01/24  0129  Toe (Comment which one)  29    Wound 03/13/24 Irritant Contact Dermatitis Incontinence Sacrum Mid gluteal cleft MASD? 03/13/24  --  Sacrum  17                  Goals of Care     Code Status:   Orders Placed This Encounter   Procedures    Full Code     Standing Status:   Standing     Number of Occurrences:   1        Designated Healthcare Decision Maker:  Ms. Weinman designated healthcare decision maker(s) is/are   HCDM (patient stated preference): [name],[name] - [phone]    HCDM, [facility], [name] - [phone]. See HCDM section of Epic sidebar/storyboard or ACP tab in patient chart for details regarding active HCDMs and patient capacity for decision-making.      Subjective     Patient is resting comfortably in bed intubated on the ventilator.  Did not rouse to voice or noxious stimuli.     Objective     Vitals - past 24 hours  Pulse:  [63-89] 80  SpO2 Pulse:  [59-88] 79  Resp:  [10-25] 18  BP: (114-147)/(54-81) 130/70  FiO2 (%):  [30 %] 30 %  SpO2:  [99 %-100 %] 99 % Intake/Output  I/O last 3 completed shifts:  In: 1275.9 [I.V.:329.4; NG/GT:560; IV Piggyback:386.5]  Out: 1140 [Urine:1140]     Physical Exam:    General: intubated, somnolent  HEENT: normocephalic, atraumatic   CV: pulses palpable, RRR   Pulm: intubated, equal expansion  GI: soft, mildly distended  MSK: 2+ pitting edema noted to her knees bilaterally, no clubbing or cyanosis  Skin: numerous large flat violaceous ecchymosis on left arm, chest, neck bilaterally.   Neuro: Withdrawals to painful stimuli. Not responding to name or following commands.     Continuous Infusions:   Infusions Meds[1]    Scheduled Medications:   Scheduled Medications[2]    PRN medications:  PRN Medications[3]    Data/Imaging Review: Reviewed in Epic and personally interpreted on 03/30/2024. See EMR for detailed results.      Brenton Joines S Jalene Lacko, MD  PGY1 Internal Medicine, [facility]                   [1] [2]    cyanocobalamin (vitamin B-12)  1,000 mcg Enteral tube: gastric Daily    enoxaparin (LOVENOX) injection  80 mg Subcutaneous Q12H SCH    flu vac 2025 [id number](PF)  0.5 mL Intramuscular During hospitalization    folic acid   1 mg Enteral tube: gastric Daily    lactulose   40 g Enteral tube: gastric 4x Daily    multivitamins (ADULT)  1 tablet Enteral tube: gastric Daily    pantoprazole  40 mg Enteral tube: gastric BID    thiamine  mononitrate (vit B1)  100 mg Enteral tube: gastric Daily   [3] acetaminophen , docusate sodium, fentaNYL  (PF) **OR** fentaNYL  (PF)

## 2024-03-31 LAB — BASIC METABOLIC PANEL
ANION GAP: 11 mmol/L (ref 5–14)
ANION GAP: 8 mmol/L (ref 5–14)
BLOOD UREA NITROGEN: 18 mg/dL (ref 9–23)
BLOOD UREA NITROGEN: 16 mg/dL (ref 9–23)
BUN / CREAT RATIO: 32
BUN / CREAT RATIO: 31
CALCIUM: 8.5 mg/dL — ABNORMAL LOW (ref 8.7–10.4)
CALCIUM: 8.7 mg/dL (ref 8.7–10.4)
CHLORIDE: 108 mmol/L — ABNORMAL HIGH (ref 98–107)
CHLORIDE: 109 mmol/L — ABNORMAL HIGH (ref 98–107)
CO2: 30 mmol/L (ref 20.0–31.0)
CO2: 31 mmol/L (ref 20.0–31.0)
CREATININE: 0.56 mg/dL (ref 0.55–1.02)
CREATININE: 0.51 mg/dL — ABNORMAL LOW (ref 0.55–1.02)
EGFR CKD-EPI (2021) FEMALE: >90 mL/min/1.73m2 (ref >=60)
EGFR CKD-EPI (2021) FEMALE: >90 mL/min/1.73m2 (ref >=60)
GLUCOSE RANDOM: 168 mg/dL (ref 70–179)
GLUCOSE RANDOM: 148 mg/dL (ref 70–179)
POTASSIUM: 3.2 mmol/L — ABNORMAL LOW (ref 3.4–4.8)
POTASSIUM: 3.5 mmol/L (ref 3.4–4.8)
SODIUM: 149 mmol/L — ABNORMAL HIGH (ref 135–145)
SODIUM: 148 mmol/L — ABNORMAL HIGH (ref 135–145)

## 2024-03-31 LAB — MAGNESIUM
MAGNESIUM: 2.1 mg/dL (ref 1.6–2.6)
MAGNESIUM: 2 mg/dL (ref 1.6–2.6)

## 2024-03-31 LAB — HEPATIC FUNCTION PANEL
ALBUMIN: 2.1 g/dL — ABNORMAL LOW (ref 3.4–5.0)
ALKALINE PHOSPHATASE: 99 U/L (ref 46–116)
ALT (SGPT): 21 U/L (ref 10–49)
AST (SGOT): 27 U/L (ref <=34)
BILIRUBIN DIRECT: 0.7 mg/dL — ABNORMAL HIGH (ref 0.00–0.30)
BILIRUBIN TOTAL: 1.5 mg/dL — ABNORMAL HIGH (ref 0.3–1.2)
PROTEIN TOTAL: 4.6 g/dL — ABNORMAL LOW (ref 5.7–8.2)

## 2024-03-31 LAB — CBC W/ AUTO DIFF
BASOPHILS ABSOLUTE COUNT: 0 10*9/L (ref 0.0–0.1)
BASOPHILS RELATIVE PERCENT: 0.5 %
EOSINOPHILS ABSOLUTE COUNT: 0.1 10*9/L (ref 0.0–0.5)
EOSINOPHILS RELATIVE PERCENT: 2.3 %
HEMATOCRIT: 27.5 % — ABNORMAL LOW (ref 34.0–44.0)
HEMOGLOBIN: 9 g/dL — ABNORMAL LOW (ref 11.3–14.9)
LYMPHOCYTES ABSOLUTE COUNT: 1.1 10*9/L (ref 1.1–3.6)
LYMPHOCYTES RELATIVE PERCENT: 16.9 %
MEAN CORPUSCULAR HEMOGLOBIN CONC: 32.9 g/dL (ref 32.0–36.0)
MEAN CORPUSCULAR HEMOGLOBIN: 31.5 pg (ref 25.9–32.4)
MEAN CORPUSCULAR VOLUME: 95.9 fL — ABNORMAL HIGH (ref 77.6–95.7)
MEAN PLATELET VOLUME: 8.9 fL (ref 6.8–10.7)
MONOCYTES ABSOLUTE COUNT: 1.2 10*9/L — ABNORMAL HIGH (ref 0.3–0.8)
MONOCYTES RELATIVE PERCENT: 19.2 %
NEUTROPHILS ABSOLUTE COUNT: 4 10*9/L (ref 1.8–7.8)
NEUTROPHILS RELATIVE PERCENT: 61.1 %
PLATELET COUNT: 100 10*9/L — ABNORMAL LOW (ref 150–450)
RED BLOOD CELL COUNT: 2.87 10*12/L — ABNORMAL LOW (ref 3.95–5.13)
RED CELL DISTRIBUTION WIDTH: 21.8 % — ABNORMAL HIGH (ref 12.2–15.2)
WBC ADJUSTED: 6.5 10*9/L (ref 3.6–11.2)

## 2024-03-31 LAB — SODIUM
SODIUM: 149 mmol/L — ABNORMAL HIGH (ref 135–145)
SODIUM: 146 mmol/L — ABNORMAL HIGH (ref 135–145)

## 2024-03-31 LAB — PHOSPHORUS
PHOSPHORUS: 1.8 mg/dL — ABNORMAL LOW (ref 2.4–5.1)
PHOSPHORUS: 2.5 mg/dL (ref 2.4–5.1)

## 2024-03-31 LAB — POTASSIUM: POTASSIUM: 3.5 mmol/L (ref 3.4–4.8)

## 2024-03-31 LAB — LMWH ANTI-XA: HEPARIN LOW MOLECULAR WEIGHT: 0.86 [IU]/mL

## 2024-03-31 MED ADMIN — folic acid (FOLVITE) tablet 1 mg: 1 mg | GASTROENTERAL | @ 12:00:00

## 2024-03-31 MED ADMIN — thiamine mononitrate (vit B1) tablet 100 mg: 100 mg | GASTROENTERAL | @ 12:00:00

## 2024-03-31 MED ADMIN — cyanocobalamin (vitamin B-12) tablet 1,000 mcg: 1000 ug | GASTROENTERAL | @ 12:00:00

## 2024-03-31 MED ADMIN — potassium chloride (KLOR-CON) packet 40 mEq: 40 meq | ORAL | @ 11:00:00 | Stop: 2024-03-31

## 2024-03-31 MED ADMIN — pantoprazole (Protonix) oral suspension: 40 mg | GASTROENTERAL

## 2024-03-31 MED ADMIN — pantoprazole (Protonix) oral suspension: 40 mg | GASTROENTERAL | @ 12:00:00

## 2024-03-31 MED ADMIN — multivitamins, therapeutic with minerals tablet 1 tablet: 1 | GASTROENTERAL | @ 12:00:00

## 2024-03-31 MED ADMIN — lactulose oral solution: 40 g | GASTROENTERAL

## 2024-03-31 MED ADMIN — lactulose oral solution: 40 g | GASTROENTERAL | @ 12:00:00 | Stop: 2024-03-31

## 2024-03-31 MED ADMIN — enoxaparin (LOVENOX) syringe 80 mg: 80 mg | SUBCUTANEOUS

## 2024-03-31 MED ADMIN — enoxaparin (LOVENOX) syringe 80 mg: 80 mg | SUBCUTANEOUS | @ 12:00:00

## 2024-03-31 MED ADMIN — potassium chloride (KLOR-CON) packet 40 mEq: 40 meq | GASTROENTERAL | @ 23:00:00 | Stop: 2024-03-31

## 2024-03-31 MED ADMIN — potassium phosphate 30 mmol in sodium chloride (NS) 0.9 % 500 mL infusion: 30 mmol | INTRAVENOUS | @ 11:00:00 | Stop: 2024-03-31

## 2024-03-31 NOTE — Progress Notes (Signed)
 MICU Daily Progress Note     Date of Service: 03/31/2024    Problem List:   Principal Problem:    Bacteremia, escherichia coli  Active Problems:    Alcoholic liver disease (HHS-HCC)    HTN (hypertension)    Depression with anxiety    Class 2 severe obesity with serious comorbidity in adult    Atrial fibrillation    (CMS-HCC)    Pleural effusion    Hypernatremia    Thrombocytopenia    Cirrhosis    (CMS-HCC)    A-fib (CMS-HCC)    Hepatic encephalopathy    (CMS-HCC)      Interval history: [name] is a 78 y.o. female with HFpEF, HTN, Afib on AC, MASLD cirrhosis, originally hospitalized for scheduled TEE/DCCV (aborted d/t LAA clot) c/b AHHRF, encephalopathy and septic shock requiring MICU care, was s/p extubation transferred to [facility] for further management of anticoagulation and hypernatremia. Transferred back to [facility] MICU for closer monitoring for respiratory status, pressor requirement, GI bleed, and management of carotid artery injury.     On 10/24, patient had acute worsening of respiratory status leading requiring intubation. When patient was given sedation for intubation, she coded and promptly had ROSC after a few minutes of CPR. Subsequently, CVC placed in neck, used to draw blood, unclear if given pressors through it, then found  to be in arterial system based on blood gas and imaging, likely right carotid. Line was subsequently removed, and pressure was held for 15 minutes, hemostasis with no hematoma. She was transferred to [facility] MICU for management of this carotid injury, to be evaluated by vascular surgery while also managing her increased vasopressor requirement and respiratory distress.  Has been weaned off pressors since 10/27 at 1 PM. VBG's indicating that she is ready for extubation, however her mentation suggests that she is not able to protect her airway    24 hour events:  - 48+ hours without pressor support  - Passing SBTs. Still not safe for extubation based on mentation.  - Continuing lactulose  and assess for improving mentation   - Sodium slightly improved    Neurological   Analgosedation (off all sedation currently)  Altered mental status with history of hepatic encephalopathy  Per chart review at [facility], rapid response called on 10/24 for increased somnolence. At the time, differential included hepatic encephalopathy, toxic metabolic encephalopathy in the setting of shock (possibly sepsis), versus intracranial pathology. She was subsequently intubated. CT head on 10/24 showed no acute intracranial abnormality. Weaned off sedation with slow improvements in neurological status.  Plan to extubate when mental status improves.  - Goal RASS 0 to -1  - Continue lactulose   - treating Hypernatremia as below which may also be contributing    Analgesia: No pain issues  RASS at goal? Yes  [place] Agitation Assessment Scale (RASS) : -1 (03/31/2024  4:00 AM)    Pulmonary   Acute Hypoxic Hypercarbic Respiratory Failure  Rapid response called on 10/24 for increased somnolence, found to have acute hypercarbic respiratory failure and subsequently intubated. Suspect hypercarbic respiratory failure due to encephalopathy as above. CXR on 10/24 showed worsening pleural effusions. Not currently getting diuresed. Given patient's hypoxia, and AMS, there was concern for infectious etiology. MRSA nares negative. Infectious workup thus far is unremarkable. SBTs show readiness extubation based on VBGs, however there is concern that she continues to be unable to protect her airway due to her current mentation   - S/p Cefepime & Vanc, not currently on antibiotics   -  Extubate pending improving mentation   - LRCx with OP flora only    S RR:  [15] 15  FiO2 (%):  [30 %] 30 %  PC Set:  [15] 15  PR SUP:  [5 cm H20-10 cm H20] 10 cm H20  O2 Device: Ventilator    Cardiovascular   Carotid Artery Injury (resolved)  At [facility], CVC placed intended for RIJ on 10/24 following intubation, PEA, ROSC. Was used with known blood return. Unclear if it was used for pressor support, but highly likely that it was. CXR for placement check showed concern for intra aterial location, and blood gas confirmed it was arterial. Thought to be in carotid artery. CVC removed with pressure held for 15 minutes, with no hematoma formation. On arrival, no physical exam and bedside ultrasound showed no large hematoma. No active bleeding.   - Vascular Surgery consulted, stating fistula has healed based on Ultrasound findings    PEA arrest s/p ROSC  Hypovolemic/Hemorrhagic shock (resolved)  Atrial fibrillation  known left atrial appendage clot  HFpEF  Patient with PEA arrest peri-intubation with ROSC. Shock thought to be hemorrhagic in the setting of GI bleed History of atrial fibrillation with known LAA clot and HFpEF. Vasopressors were weaned off 10/27. APTT was elevated the morning of 10/28 necessitating changing anticoagulation from therapeutic heparin to therapeutic Lovenox .   - Hold Coreg , eliquis, GDMT given hemodynamic instability, consider restarting once more clinically stable  - Continue anticoagulation with therapeutic lovenox for LAA clot; following anti-Xa for therapeutic efficacy    Renal   Hypernatremia  At [facility], had persistently hypernatremia near 153. Had been treated with D5 gtt. Sodium throughout admission has largely been 146-148. Sodium was 151 morning of 10/28, presume this will decrease once we start tube feeds.   - Strict I/O's  - Sodium check q6hours   - FWF with TF, adjusting as needed    Infectious Disease/Autoimmune   E. Coli Bacteremia (resolved),   Initially admitted for afib, found to have E. coli bacteremia on October 10. This was treated with cefazolin . Rapid response called 10/24, patient noted to be hypothermic. WBCs jumped from 3.0 to 9.7 on 10/24. Although the most recent lab was following intubation and CPR. UA with pyuria, rare bacteria, hematuria. Peri-intubation, patient developed significant hypotension requiring initiation of NE and vasopressin. Suspect possible septic shock with unknown infectious source. Patient started on vancomycin and cefepime 10/24. CT Abdomen pelvis concerning for aspiration pneumonia. Following infectious workup was unremarkable. Antibiotics were stopped on 10/27.   - follow blood, urine cultures to final    Cultures:  Blood Culture, Routine (no units)   Date Value   03/25/2024 No Growth at 5 days   03/25/2024 No Growth at 5 days     Urine Culture, Comprehensive (no units)   Date Value   03/25/2024 <10,000 CFU/mL     Lower Respiratory Culture (no units)   Date Value   03/29/2024 Specimen Not Processed   03/27/2024 OROPHARYNGEAL FLORA ISOLATED     WBC (10*9/L)   Date Value   03/31/2024 6.5     WBC, UA (/HPF)   Date Value   03/25/2024 25 (H)       FEN/GI   Sigmoid Mass c/f Malignancy and Hematochezia (resolved)  Hemorrhagic Shock (resolved)  CT abdomen pelvis done 10/24 with evidence of cirrhosis and masslike thickening of the lower sigmoid colon concerning for malignancy.  CTA abdomen pelvis performed 1024 with active extravasation into the gastric fundus possibly secondary to  traumatic enteric tube placement.  GI scoped the patient on 10/25 and clipped an area of active bleeding.  They removed a total of 1.5 L of blood.  Hemoglobin 10/20 6 AM downtrended to 6.0, so 2 units of blood transfused.  GI stated this was consistent with 10/25 scope and likely did not represent increased bleeding. Hb steadily improving with Hb of 9.2 on 10/28.   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 Given shock of unclear etiology. CTAP demonstrating cirrhosis. Likely that cirrhosis is contributing to hypotension.   - Continue lactulose ; target 3-5 BMs daily  - NGT in place, on TF and FWF  - Daily MELD labs and CMP     MELD 3.0: 14 at 03/27/2024  5:19 AM  MELD-Na: 11 at 03/27/2024  5:19 AM  Calculated from:  Serum Creatinine: 0.99 mg/dL (Using min of 1 mg/dL) at 89/73/7974  4:80 AM  Serum Sodium: 146 mmol/L (Using max of 137 mmol/L) at 03/27/2024  5:19 AM  Total Bilirubin: 2.2 mg/dL at 89/73/7974  4:80 AM  Serum Albumin: 2.8 g/dL at 89/73/7974  4:80 AM  INR(ratio): 1.19 at 03/25/2024 10:27 PM  Age at listing (hypothetical): 78 years  Sex: Female at 03/27/2024  5:19 AM    Provider Malnutrition Assessment:  Body mass index is 34.8 kg/m??. BMI Interpretation: >/= 30 and < 40, consistent with obesity, clinically significant requiring additional resources and complicating multiple aspects of patient care.  GLIM criteria:   Pt does not meet criteria  -I have screened this patient for malnutrition and they did NOT meet criteria for malnutrition based on GLIM criteria.  -TF and FWF; Dietician consulted, appreciate assistance    Heme/Coag   Acute blood loss anemia, resolving  Hemoglobin down trended to 6.0 on the morning of 10/26 thought to be secondary to acute GI bleed that was scoped and clipped 10/25. Patient received 2 units PRBC 10/26 and an additional unit 10/27. Hb steadily improving with Hb of 9.2 on 10/28.   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 Reason: Not indicated.    Prophylaxis/LDA/Restraints/Consults   ICU Checklist completed: yes (see ICU rounding navigator in Epic)    Patient Lines/Drains/Airways Status       Active Active Lines, Drains, & Airways       Name Placement date Placement time Site Days    ETT  7.5 03/25/24  1317  -- 5    CVC Triple Lumen 03/25/24 Non-tunneled Left Internal jugular 03/25/24  2316  Internal jugular  5    NG/OG Tube 18 Fr. Center mouth 03/25/24  1800  Center mouth  5    Urethral Catheter Temperature probe;Non-latex 03/25/24  1354  Temperature probe;Non-latex  5    Peripheral IV 03/20/24 Anterior;Left;Upper Arm 03/20/24  1730  Arm  10    Peripheral IV 03/25/24 Left;Posterior Hand 03/25/24  1950  Hand  5                  Patient Lines/Drains/Airways Status       Active Wounds  Name Placement date Placement time Site Days    Wound 03/01/24 Other (Comment) Toe (Comment which one) Left;Other (Comment) Blister present on arrival, tx already in outpatient setting, in process of healing, surrounding erythema 03/01/24  0129  Toe (Comment which one)  30    Wound 03/13/24 Irritant Contact Dermatitis Incontinence Sacrum Mid gluteal cleft MASD? 03/13/24  --  Sacrum  18                  Goals of Care     Code Status:   Orders Placed This Encounter   Procedures    Full Code     Standing Status:   Standing     Number of Occurrences:   1        Designated Healthcare Decision Maker:  Ms. Rena designated healthcare decision maker(s) is/are   HCDM (patient stated preference): Cerny,[name] - Spouse - [phone]    HCDM, First AlternateKristiann, Noyce - Daughter - [phone]. See HCDM section of Epic sidebar/storyboard or ACP tab in patient chart for details regarding active HCDMs and patient capacity for decision-making.      Subjective     Patient is resting comfortably in bed intubated on the ventilator.  Did not rouse to voice or noxious stimuli.     Objective     Vitals - past 24 hours  Pulse:  [79-110] 93  SpO2 Pulse:  [76-113] 95  Resp:  [13-26] 21  BP: (124-164)/(57-102) 138/74  FiO2 (%):  [30 %] 30 %  SpO2:  [97 %-100 %] 100 % Intake/Output  I/O last 3 completed shifts:  In: 98 [I.V.:10; NG/GT:4655]  Out: 1005 [Urine:1005]     Physical Exam:    General: intubated, somnolent  HEENT: normocephalic, atraumatic   CV: pulses palpable, RRR   Pulm: intubated, equal expansion  GI: soft, mildly distended  MSK: 2+ pitting edema noted to her knees bilaterally, no clubbing or cyanosis  Skin: numerous large flat violaceous ecchymosis on left arm, chest, neck bilaterally.   Neuro: Withdrawals to painful stimuli. Not responding to name or following commands.     Continuous Infusions:   Infusions Meds[1]    Scheduled Medications:   Scheduled Medications[2]    PRN medications:  PRN Medications[3]    Data/Imaging Review: Reviewed in Epic and personally interpreted on 03/31/2024. See EMR for detailed results.      Manaal Mandala S Antrice Pal, MD  PGY1 Internal Medicine, [facility]                     [1] [2]    cyanocobalamin (vitamin B-12)  1,000 mcg Enteral tube: gastric Daily    enoxaparin (LOVENOX) injection  80 mg Subcutaneous Q12H SCH    flu vac 2025 [id number](PF)  0.5 mL Intramuscular During hospitalization    folic acid   1 mg Enteral tube: gastric Daily    lactulose   40 g Enteral tube: gastric 4x Daily    multivitamins (ADULT)  1 tablet Enteral tube: gastric Daily    pantoprazole  40 mg Enteral tube: gastric BID    potassium phosphate  30 mmol Intravenous Once    thiamine  mononitrate (vit B1)  100 mg Enteral tube: gastric Daily   [3] acetaminophen , docusate sodium, fentaNYL  (PF) **OR** fentaNYL  (PF)

## 2024-03-31 NOTE — Plan of Care (Signed)
 Pt will open eyes and wiggle toes upon commands, withdraws to pain on all 4 extremities. Pupils reactive, reflexes intact. A fib, normotensive, afebrile. Adequate UO via foley. 4 Bms this shift. TF at goal. Daughter update via phone.    Problem: Skin Injury Risk Increased  Goal: Skin Health and Integrity  Intervention: Optimize Skin Protection  Recent Flowsheet Documentation  Taken 03/31/2024 1800 by Delores Maurilio HERO, RN  Activity Management: bedrest  Head of Bed [facility]) Positioning: HOB at 30-45 degrees  Taken 03/31/2024 1600 by Delores Maurilio HERO, RN  Activity Management: bedrest  Head of Bed [facility]) Positioning: HOB at 30-45 degrees  Taken 03/31/2024 1400 by Delores Maurilio HERO, RN  Activity Management: bedrest  Head of Bed [facility]) Positioning: HOB at 30-45 degrees  Taken 03/31/2024 1200 by Delores Maurilio HERO, RN  Activity Management: bedrest  Head of Bed [facility]) Positioning: HOB at 30-45 degrees  Taken 03/31/2024 1000 by Delores Maurilio HERO, RN  Activity Management: bedrest  Head of Bed [name]) Positioning: HOB at 30-45 degrees  Taken 03/31/2024 0800 by Delores Maurilio HERO, RN  Activity Management: bedrest  Pressure Reduction Techniques:   heels elevated off bed   weight shift assistance provided  Head of Bed (HOB) Positioning: HOB at 30-45 degrees  Pressure Reduction Devices:   heel offloading device utilized   positioning supports utilized   pressure-redistributing mattress utilized  Skin Protection:   adhesive use limited   cleansing with dimethicone incontinence wipes   incontinence pads utilized     Problem: Adult Inpatient Plan of Care  Goal: Absence of Hospital-Acquired Illness or Injury  Intervention: Identify and Manage Fall Risk  Recent Flowsheet Documentation  Taken 03/31/2024 0800 by Delores Maurilio HERO, RN  Safety Interventions:   aspiration precautions   enteral feeding safety   environmental modification   fall reduction program maintained   infection management   lighting adjusted for tasks/safety   low bed  Intervention: Prevent Skin Injury  Recent Flowsheet Documentation  Taken 03/31/2024 1800 by Delores Maurilio HERO, RN  Positioning for Skin: Left  Taken 03/31/2024 1600 by Delores Maurilio HERO, RN  Positioning for Skin: Right  Taken 03/31/2024 1400 by Delores Maurilio HERO, RN  Positioning for Skin: Left  Taken 03/31/2024 1200 by Delores Maurilio HERO, RN  Positioning for Skin: Right  Taken 03/31/2024 1000 by Delores Maurilio HERO, RN  Positioning for Skin: Left  Taken 03/31/2024 0800 by Delores Maurilio HERO, RN  Positioning for Skin: Right  Device Skin Pressure Protection:   absorbent pad utilized/changed   adhesive use limited   tubing/devices free from skin contact  Skin Protection:   adhesive use limited   cleansing with dimethicone incontinence wipes   incontinence pads utilized  Intervention: Prevent Infection  Recent Flowsheet Documentation  Taken 03/31/2024 0800 by Delores Maurilio HERO, RN  Infection Prevention:   cohorting utilized   environmental surveillance performed   equipment surfaces disinfected   hand hygiene promoted   personal protective equipment utilized   rest/sleep promoted   single patient room provided   visitors restricted/screened     Problem: Breathing Pattern Ineffective  Goal: Effective Breathing Pattern  Intervention: Promote Improved Breathing Pattern  Recent Flowsheet Documentation  Taken 03/31/2024 1800 by Delores Maurilio HERO, RN  Head of Bed [facility]) Positioning: HOB at 30-45 degrees  Taken 03/31/2024 1600 by Delores Maurilio HERO, RN  Head of Bed [facility]) Positioning: HOB at 30-45 degrees  Taken 03/31/2024 1400 by Delores Maurilio HERO, RN  Head of Bed [facility]) Positioning:  HOB at 30-45 degrees  Taken 03/31/2024 1200 by Delores Maurilio HERO, RN  Head of Bed [facility]) Positioning: HOB at 30-45 degrees  Taken 03/31/2024 1000 by Delores Maurilio HERO, RN  Head of Bed [facility]) Positioning: HOB at 30-45 degrees  Taken 03/31/2024 0800 by Delores Maurilio HERO, RN  Head of Bed [facility]) Positioning: HOB at 30-45 degrees     Problem: Fall Injury Risk  Goal: Absence of Fall and Fall-Related Injury  Intervention: Promote Injury-Free Environment  Recent Flowsheet Documentation  Taken 03/31/2024 0800 by Delores Maurilio HERO, RN  Safety Interventions:   aspiration precautions   enteral feeding safety   environmental modification   fall reduction program maintained   infection management   lighting adjusted for tasks/safety   low bed     Problem: Gas Exchange Impaired  Goal: Optimal Gas Exchange  Intervention: Optimize Oxygenation and Ventilation  Recent Flowsheet Documentation  Taken 03/31/2024 1800 by Delores Maurilio HERO, RN  Head of Bed [facility]) Positioning: HOB at 30-45 degrees  Taken 03/31/2024 1600 by Delores Maurilio HERO, RN  Head of Bed [facility]) Positioning: HOB at 30-45 degrees  Taken 03/31/2024 1400 by Delores Maurilio HERO, RN  Head of Bed [facility]) Positioning: HOB at 30-45 degrees  Taken 03/31/2024 1200 by Delores Maurilio HERO, RN  Head of Bed [facility]) Positioning: HOB at 30-45 degrees  Taken 03/31/2024 1000 by Delores Maurilio HERO, RN  Head of Bed [facility]) Positioning: HOB at 30-45 degrees  Taken 03/31/2024 0800 by Delores Maurilio HERO, RN  Head of Bed [facility]) Positioning: HOB at 30-45 degrees     Problem: Non-Violent Restraints  Intervention: Utilize least restrictive measures  Recent Flowsheet Documentation  Taken 03/31/2024 1800 by Delores Maurilio HERO, RN  Less Restrictive Alternative:   1:1 patient care   Repositioning  Taken 03/31/2024 1600 by Delores Maurilio HERO, RN  Less Restrictive Alternative:   1:1 patient care   Repositioning  Taken 03/31/2024 1400 by Delores Maurilio HERO, RN  Less Restrictive Alternative:   1:1 patient care   Repositioning  Taken 03/31/2024 1210 by Delores Maurilio HERO, RN  Less Restrictive Alternative:   1:1 patient care   Repositioning  Taken 03/31/2024 1200 by Delores Maurilio HERO, RN  Less Restrictive Alternative:   1:1 patient care   Repositioning  Taken 03/31/2024 1000 by Delores Maurilio HERO, RN  Less Restrictive Alternative:   1:1 patient care   Repositioning  Taken 03/31/2024 0800 by Delores Maurilio HERO, RN  Less Restrictive Alternative:   1:1 patient care   Repositioning  Intervention: Patient Monitoring  Recent Flowsheet Documentation  Taken 03/31/2024 1800 by Delores Maurilio HERO, RN  Psychological Status/Visual Check: Subdued  Circulation/Skin Integrity: No signs of injury  Range of Motion: Performed  Fluids: NPO  Food/Meal: Enteral feeding/TPN  Elimination: Urinary catheter  Taken 03/31/2024 1600 by Delores Maurilio HERO, RN  Psychological Status/Visual Check: Subdued  Circulation/Skin Integrity: No signs of injury  Range of Motion: Performed  Fluids: NPO  Food/Meal: Enteral feeding/TPN  Elimination: Urinary catheter  Taken 03/31/2024 1400 by Delores Maurilio HERO, RN  Psychological Status/Visual Check: Subdued  Circulation/Skin Integrity: No signs of injury  Range of Motion: Performed  Fluids: NPO  Food/Meal: Enteral feeding/TPN  Elimination: Urinary catheter  Taken 03/31/2024 1210 by Delores Maurilio HERO, RN  Psychological Status/Visual Check: Subdued  Circulation/Skin Integrity: No signs of injury  Range of Motion: Performed  Fluids: NPO  Food/Meal: Enteral feeding/TPN  Elimination: Urinary catheter  Taken 03/31/2024 1200 by Delores Maurilio HERO, RN  Psychological Status/Visual  Check: Subdued  Circulation/Skin Integrity: No signs of injury  Range of Motion: Performed  Fluids: NPO  Food/Meal: Enteral feeding/TPN  Elimination: Urinary catheter  Taken 03/31/2024 1000 by Delores Maurilio HERO, RN  Psychological Status/Visual Check: Subdued  Circulation/Skin Integrity: No signs of injury  Range of Motion: Performed  Fluids: NPO  Food/Meal: Enteral feeding/TPN  Elimination: Urinary catheter  Taken 03/31/2024 0800 by Delores Maurilio HERO, RN  Psychological Status/Visual Check: Subdued  Circulation/Skin Integrity: No signs of injury  Range of Motion: Performed  Fluids: NPO  Food/Meal: Enteral feeding/TPN  Elimination: Urinary catheter  Intervention: Patient Education  Recent Flowsheet Documentation  Taken 03/31/2024 1210 by Delores Maurilio HERO, RN  Criteria Explained: Yes  Patient's Response: Patient Unable to respond  Family Notification: Other     Problem: Wound  Goal: Optimal Functional Ability  Intervention: Optimize Functional Ability  Recent Flowsheet Documentation  Taken 03/31/2024 1800 by Delores Maurilio HERO, RN  Activity Management: bedrest  Taken 03/31/2024 1600 by Delores Maurilio HERO, RN  Activity Management: bedrest  Taken 03/31/2024 1400 by Delores Maurilio HERO, RN  Activity Management: bedrest  Taken 03/31/2024 1200 by Delores Maurilio HERO, RN  Activity Management: bedrest  Taken 03/31/2024 1000 by Delores Maurilio HERO, RN  Activity Management: bedrest  Taken 03/31/2024 0800 by Delores Maurilio HERO, RN  Activity Management: bedrest  Goal: Absence of Infection Signs and Symptoms  Intervention: Prevent or Manage Infection  Recent Flowsheet Documentation  Taken 03/31/2024 0800 by Delores Maurilio HERO, RN  Infection Management: aseptic technique maintained  Goal: Skin Health and Integrity  Intervention: Optimize Skin Protection  Recent Flowsheet Documentation  Taken 03/31/2024 1800 by Delores Maurilio HERO, RN  Activity Management: bedrest  Head of Bed [facility]) Positioning: HOB at 30-45 degrees  Taken 03/31/2024 1600 by Delores Maurilio HERO, RN  Activity Management: bedrest  Head of Bed [facility]) Positioning: HOB at 30-45 degrees  Taken 03/31/2024 1400 by Delores Maurilio HERO, RN  Activity Management: bedrest  Head of Bed [facility]) Positioning: HOB at 30-45 degrees  Taken 03/31/2024 1200 by Delores Maurilio HERO, RN  Activity Management: bedrest  Head of Bed [facility]) Positioning: HOB at 30-45 degrees  Taken 03/31/2024 1000 by Delores Maurilio HERO, RN  Activity Management: bedrest  Head of Bed [facility]) Positioning: HOB at 30-45 degrees  Taken 03/31/2024 0800 by Delores Maurilio HERO, RN  Activity Management: bedrest  Pressure Reduction Techniques:   heels elevated off bed   weight shift assistance provided  Head of Bed (HOB) Positioning: HOB at 30-45 degrees  Pressure Reduction Devices:   heel offloading device utilized   positioning supports utilized   pressure-redistributing mattress utilized  Skin Protection:   adhesive use limited   cleansing with dimethicone incontinence wipes   incontinence pads utilized     Problem: Mechanical Ventilation Invasive  Goal: Optimal Device Function  Intervention: Optimize Device Care and Function  Recent Flowsheet Documentation  Taken 03/31/2024 1600 by Delores Maurilio HERO, RN  Oral Care:   mouth swabbed   oral rinse provided   suction provided  Taken 03/31/2024 1200 by Delores Maurilio HERO, RN  Oral Care:   mouth swabbed   oral rinse provided   suction provided  Taken 03/31/2024 0800 by Delores Maurilio HERO, RN  Oral Care:   mouth swabbed   suction provided   teeth brushed   tongue brushed  Goal: Absence of Device-Related Skin and Tissue Injury  Intervention: Maintain Skin and Tissue Health  Recent Flowsheet Documentation  Taken 03/31/2024 0800 by Delores Maurilio  M, RN  Device Skin Pressure Protection:   absorbent pad utilized/changed   adhesive use limited   tubing/devices free from skin contact  Goal: Absence of Ventilator-Induced Lung Injury  Intervention: Prevent Ventilator-Associated Pneumonia  Recent Flowsheet Documentation  Taken 03/31/2024 1800 by Delores Maurilio HERO, RN  Head of Bed [facility]) Positioning: HOB at 30-45 degrees  Taken 03/31/2024 1600 by Delores Maurilio HERO, RN  Head of Bed [facility]) Positioning: HOB at 30-45 degrees  Oral Care:   mouth swabbed   oral rinse provided   suction provided  Taken 03/31/2024 1400 by Delores Maurilio HERO, RN  Head of Bed [facility]) Positioning: HOB at 30-45 degrees  Taken 03/31/2024 1200 by Delores Maurilio HERO, RN  Head of Bed [facility]) Positioning: HOB at 30-45 degrees  Oral Care:   mouth swabbed   oral rinse provided   suction provided  Taken 03/31/2024 1000 by Delores Maurilio HERO, RN  Head of Bed [facility]) Positioning: HOB at 30-45 degrees  Taken 03/31/2024 0800 by Delores Maurilio HERO, RN  Head of Bed [facility]) Positioning: HOB at 30-45 degrees  Oral Care:   mouth swabbed   suction provided   teeth brushed   tongue brushed     Problem: Mechanical Ventilation Invasive  Goal: Optimal Device Function  Intervention: Optimize Device Care and Function  Recent Flowsheet Documentation  Taken 03/31/2024 1600 by Delores Maurilio HERO, RN  Oral Care:   mouth swabbed   oral rinse provided   suction provided  Taken 03/31/2024 1200 by Delores Maurilio HERO, RN  Oral Care:   mouth swabbed   oral rinse provided   suction provided  Taken 03/31/2024 0800 by Delores Maurilio HERO, RN  Oral Care:   mouth swabbed   suction provided   teeth brushed   tongue brushed  Goal: Absence of Device-Related Skin and Tissue Injury  Intervention: Maintain Skin and Tissue Health  Recent Flowsheet Documentation  Taken 03/31/2024 0800 by Delores Maurilio HERO, RN  Device Skin Pressure Protection:   absorbent pad utilized/changed   adhesive use limited   tubing/devices free from skin contact  Goal: Absence of Ventilator-Induced Lung Injury  Intervention: Prevent Ventilator-Associated Pneumonia  Recent Flowsheet Documentation  Taken 03/31/2024 1800 by Delores Maurilio HERO, RN  Head of Bed [facility]) Positioning: HOB at 30-45 degrees  Taken 03/31/2024 1600 by Delores Maurilio HERO, RN  Head of Bed [facility]) Positioning: HOB at 30-45 degrees  Oral Care:   mouth swabbed   oral rinse provided   suction provided  Taken 03/31/2024 1400 by Delores Maurilio HERO, RN  Head of Bed [facility]) Positioning: HOB at 30-45 degrees  Taken 03/31/2024 1200 by Delores Maurilio HERO, RN  Head of Bed [facility]) Positioning: HOB at 30-45 degrees  Oral Care:   mouth swabbed   oral rinse provided   suction provided  Taken 03/31/2024 1000 by Delores Maurilio HERO, RN  Head of Bed [facility]) Positioning: HOB at 30-45 degrees  Taken 03/31/2024 0800 by Delores Maurilio HERO, RN  Head of Bed [facility]) Positioning: HOB at 30-45 degrees  Oral Care:   mouth swabbed   suction provided   teeth brushed   tongue brushed     Problem: Mechanical Ventilation Invasive  Goal: Optimal Device Function  Intervention: Optimize Device Care and Function  Recent Flowsheet Documentation  Taken 03/31/2024 1600 by Delores Maurilio HERO, RN  Oral Care:   mouth swabbed   oral rinse provided   suction provided  Taken 03/31/2024 1200 by Delores Maurilio HERO, RN  Oral Care:  mouth swabbed   oral rinse provided   suction provided  Taken 03/31/2024 0800 by Delores Maurilio HERO, RN  Oral Care:   mouth swabbed   suction provided   teeth brushed   tongue brushed  Goal: Absence of Device-Related Skin and Tissue Injury  Intervention: Maintain Skin and Tissue Health  Recent Flowsheet Documentation  Taken 03/31/2024 0800 by Delores Maurilio HERO, RN  Device Skin Pressure Protection:   absorbent pad utilized/changed   adhesive use limited   tubing/devices free from skin contact  Goal: Absence of Ventilator-Induced Lung Injury  Intervention: Prevent Ventilator-Associated Pneumonia  Recent Flowsheet Documentation  Taken 03/31/2024 1800 by Delores Maurilio HERO, RN  Head of Bed [facility]) Positioning: HOB at 30-45 degrees  Taken 03/31/2024 1600 by Delores Maurilio HERO, RN  Head of Bed [facility]) Positioning: HOB at 30-45 degrees  Oral Care:   mouth swabbed   oral rinse provided   suction provided  Taken 03/31/2024 1400 by Delores Maurilio HERO, RN  Head of Bed [facility]) Positioning: HOB at 30-45 degrees  Taken 03/31/2024 1200 by Delores Maurilio HERO, RN  Head of Bed [facility]) Positioning: HOB at 30-45 degrees  Oral Care:   mouth swabbed   oral rinse provided   suction provided  Taken 03/31/2024 1000 by Delores Maurilio HERO, RN  Head of Bed [facility]) Positioning: HOB at 30-45 degrees  Taken 03/31/2024 0800 by Delores Maurilio HERO, RN  Head of Bed [facility]) Positioning: HOB at 30-45 degrees  Oral Care:   mouth swabbed   suction provided   teeth brushed   tongue brushed     Problem: Mechanical Ventilation Invasive  Goal: Optimal Device Function  Intervention: Optimize Device Care and Function  Recent Flowsheet Documentation  Taken 03/31/2024 1600 by Delores Maurilio HERO, RN  Oral Care:   mouth swabbed   oral rinse provided   suction provided  Taken 03/31/2024 1200 by Delores Maurilio HERO, RN  Oral Care:   mouth swabbed   oral rinse provided   suction provided  Taken 03/31/2024 0800 by Delores Maurilio HERO, RN  Oral Care:   mouth swabbed   suction provided   teeth brushed   tongue brushed  Goal: Absence of Device-Related Skin and Tissue Injury  Intervention: Maintain Skin and Tissue Health  Recent Flowsheet Documentation  Taken 03/31/2024 0800 by Delores Maurilio HERO, RN  Device Skin Pressure Protection:   absorbent pad utilized/changed   adhesive use limited   tubing/devices free from skin contact  Goal: Absence of Ventilator-Induced Lung Injury  Intervention: Prevent Ventilator-Associated Pneumonia  Recent Flowsheet Documentation  Taken 03/31/2024 1800 by Delores Maurilio HERO, RN  Head of Bed [facility]) Positioning: HOB at 30-45 degrees  Taken 03/31/2024 1600 by Delores Maurilio HERO, RN  Head of Bed [facility]) Positioning: HOB at 30-45 degrees  Oral Care:   mouth swabbed   oral rinse provided   suction provided  Taken 03/31/2024 1400 by Delores Maurilio HERO, RN  Head of Bed [facility]) Positioning: HOB at 30-45 degrees  Taken 03/31/2024 1200 by Delores Maurilio HERO, RN  Head of Bed [facility]) Positioning: HOB at 30-45 degrees  Oral Care:   mouth swabbed   oral rinse provided   suction provided  Taken 03/31/2024 1000 by Delores Maurilio HERO, RN  Head of Bed [facility]) Positioning: HOB at 30-45 degrees  Taken 03/31/2024 0800 by Delores Maurilio HERO, RN  Head of Bed [facility]) Positioning: HOB at 30-45 degrees  Oral Care:   mouth swabbed   suction provided   teeth  brushed   tongue brushed

## 2024-03-31 NOTE — Consults (Signed)
 Enoxaparin Therapeutic Monitoring Pharmacy Note    [name] is a 78 y.o. female continuing enoxaparin.    Indication: left ventricular thrombus    Prior Dosing Information: Current regimen enoxaparin 80 mg BID    Goals:  Therapeutic Drug Levels  LMWH Anti-Xa level: 0.6-1 units/mL drawn 4-6 hours post-dose    Additional Clinical Monitoring/Outcomes  Monitor hemoglobin and platelets  Monitor for signs/symptoms of bleeding  Monitor renal function     Results:  LMWH Anti-Xa level 0.86 units/mL, drawn appropriately  Wt Readings from Last 3 Encounters:   03/26/24 92 kg (202 lb 13.2 oz)   03/07/24 89.3 kg (196 lb 12.8 oz)   03/03/24 89.1 kg (196 lb 6.4 oz)     HGB   Date Value Ref Range Status   03/31/2024 9.0 (L) 11.3 - 14.9 g/dL Final     Platelet   Date Value Ref Range Status   03/31/2024 100 (L) 150 - 450 10*9/L Final     Creatinine   Date Value Ref Range Status   03/31/2024 0.51 (L) 0.55 - 1.02 mg/dL Final       Pharmacokinetic Considerations and Significant Drug Interactions:   Concurrent antiplatelet medications: none identified    Assessment/Plan:   Recommendation(s)  Continue current regimen of enoxaparin 80 mg BID    Follow-up  Level due: No levels indicated at this time  A pharmacist will continue to monitor and recommend levels as appropriate      Please page service pharmacist with questions/clarifications.    Katerina Kista Robb, PharmD  PGY2 Emergency Medicine Pharmacy Resident

## 2024-03-31 NOTE — Progress Notes (Signed)
 Adult Nutrition Reassessment Note    Visit Type: MD Consult  Reason for Visit: Assessment (Nutrition)    NUTRITION INTERVENTIONS and RECOMMENDATION     Tube feeds: increasing slightly with re-estimated PSU  Recommend Vital High Protein at goal rate 70 mL/hr. This provides 1470 kcals, 129 g protein, 164 g carbohydrate, 35 g fat, 0 g fiber, 1229 mL free water , and meets 98% USRDI.  Continuing high FWF 250 mL Q2h for hypernatremia      Carries severe malnutrition dx - needing significant electrolyte repletion   Weekly weights  Continue multivitamin, thiamine  100 mg     NUTRITION ASSESSMENT     Patient appropriate for enteral nutrition support given mechanical ventilation.   Appropriate for hypocaloric / high protein regimen iso BMI > 30 / ICU status - increasing towards PSU today given MICU since 10/24   Lactulose  being titrated for goal of 3-5 BMs per day  Optimally once hypernatremia resolves, should be on more fluid restricted formula given EtOH cirrhosis, but high FWF requirements at this time for hypernatremia & lower UOP     NUTRITIONALLY RELEVANT DATA     HPI & PMH:   NINETTA ADELSTEIN is a 78 y.o. female who is presenting to [facility] with Bacteremia, escherichia coli, in the setting of the following pertinent/contributing co-morbidities:  HFpEF, HTN, Cirrhosis, Breast Cancer s/p Partial Mastectomy/Radiation.     Nutrition Progress:   Remains intubated but no sedation/pressors. EtOH cirrhosis.   Hypernatremia but also lower UOP though net positive - keeping non-concentrated tube feeding formula for now. >6 BM/day - team decreasing lactulose      Medications:  Nutritionally pertinent medications reviewed and evaluated for potential food and/or medication interactions.   No pressors/no sedation   B12, folvite , lactulose  40 g TID, multivitamin, protonix, klor-con , 30 mmol IV k phos, thiamine  100 mg    Labs:   Nutritionally pertinent labs reviewed.   Na (148), phos (1.8) --> 2.5  Glucose (104-189 mg/dL) Nutritional Needs:   Daily Estimated Nutrient Needs:  Energy: [phone] kcals 11-14 kcal/kg (per ASPEN/SCCM guidelines for the critically ill obese, BMI 30-50) using admission body weight, 87 kg (03/29/24 1356)]  Protein: > 109 gm [> 2 gm/kg (per ASPEN/SCCM guidelines for the critically ill obese, BMI 30-39.9) using ideal body weight, 55 kg (03/29/24 1356)]  Carbohydrate:   [45-60% of kcal]  Fluid:   mL [per MD team]    Compared to Adjusted PSU Equation for Ventillated Patients (BMI >/=30, Age >/= 60)  Tmax: 38, Ve: 6.93  Energy: 1577 kcals/day    Anthropometric Data:  Height: 162.6 cm (5' 4.02)   Admission weight: 87 kg (191 lb 12.8 oz)  Last recorded weight: 92 kg (202 lb 13.2 oz) (03/26/24)  IBW: 54.53 kg  BMI: Body mass index is 34.8 kg/m??.   Usual Body Weight: Unable to obtain at this time   Weight Assessment: per below - trending down pta, though pt with cirrhosis & currently unknown dry wt. Increase over admit likely influenced by fluid - pt noted with 2+ pitting edema bilateral knees and I/Os show pt +11 L over admit.    Wt Readings from Last 10 Encounters:   03/26/24 92 kg (202 lb 13.2 oz)   03/07/24 89.3 kg (196 lb 12.8 oz)   03/03/24 89.1 kg (196 lb 6.4 oz)   04/23/23 96.6 kg (213 lb)   03/05/23 100.2 kg (220 lb 14.4 oz)   11/27/22 (!) 102.5 kg (225 lb 14.4 oz)   10/20/22 ROLLEN)  108 kg (238 lb)   07/17/22 (!) 108 kg (238 lb)   06/09/22 (!) 103.2 kg (227 lb 8 oz)   05/08/22 (!) 105.8 kg (233 lb 3.2 oz)     Malnutrition Assessment:  Malnutrition Assessment using AND/ASPEN or GLIM Clinical Characteristics:  Patient does not meet AND/ASPEN criteria for malnutrition at this time (03/16/24 1326)       Nutrition Focused Physical Exam:      Nutrition Evaluation  Overall Impressions: Nutrition-Focused Physical Exam not indicated due to lack of malnutrition risk factors. (03/16/24 1326)     Care plan:  Patient does not meet malnutrition criteria     Current Nutrition:  NPO with OG tube in place   Nutrition Orders            Vital High Protein (Critical Care Low Cal/HP) continuous tube feed starting at 10/29 1700          Nutritionally Pertinent Allergies, Intolerances, Sensitivities, and/or Cultural/Religious Restrictions:  none identified at this time     GOALS and EVALUATION     Patient to meet 80% or greater of nutritional needs via enteral nutrition while remains on tube feeds. - Meeting/Ongoing    Motivation, Barriers, and Compliance:  Evaluation of motivation, barriers, and compliance pending at this time due to clinical status.     Discharge Planning:   Monitor for potential discharge needs with multi-disciplinary team.          Follow-Up Parameters:   1-2 times per week (and more frequent as indicated)    Hulda La MPH, RD, CNSC, LDN   Pager: [phone]  Phone: [phone]  [email or web address]

## 2024-04-01 LAB — CBC W/ AUTO DIFF
BASOPHILS ABSOLUTE COUNT: 0 10*9/L (ref 0.0–0.1)
BASOPHILS RELATIVE PERCENT: 0.4 %
EOSINOPHILS ABSOLUTE COUNT: 0.2 10*9/L (ref 0.0–0.5)
EOSINOPHILS RELATIVE PERCENT: 2.9 %
HEMATOCRIT: 26.2 % — ABNORMAL LOW (ref 34.0–44.0)
HEMOGLOBIN: 8.9 g/dL — ABNORMAL LOW (ref 11.3–14.9)
LYMPHOCYTES ABSOLUTE COUNT: 1.6 10*9/L (ref 1.1–3.6)
LYMPHOCYTES RELATIVE PERCENT: 24.5 %
MEAN CORPUSCULAR HEMOGLOBIN CONC: 33.7 g/dL (ref 32.0–36.0)
MEAN CORPUSCULAR HEMOGLOBIN: 32.2 pg (ref 25.9–32.4)
MEAN CORPUSCULAR VOLUME: 95.5 fL (ref 77.6–95.7)
MEAN PLATELET VOLUME: 9.1 fL (ref 6.8–10.7)
MONOCYTES ABSOLUTE COUNT: 1.2 10*9/L — ABNORMAL HIGH (ref 0.3–0.8)
MONOCYTES RELATIVE PERCENT: 19 %
NEUTROPHILS ABSOLUTE COUNT: 3.4 10*9/L (ref 1.8–7.8)
NEUTROPHILS RELATIVE PERCENT: 53.2 %
PLATELET COUNT: 99 10*9/L — ABNORMAL LOW (ref 150–450)
RED BLOOD CELL COUNT: 2.75 10*12/L — ABNORMAL LOW (ref 3.95–5.13)
RED CELL DISTRIBUTION WIDTH: 22.5 % — ABNORMAL HIGH (ref 12.2–15.2)
WBC ADJUSTED: 6.5 10*9/L (ref 3.6–11.2)

## 2024-04-01 LAB — SLIDE REVIEW

## 2024-04-01 LAB — PHOSPHORUS
PHOSPHORUS: 1.5 mg/dL — ABNORMAL LOW (ref 2.4–5.1)
PHOSPHORUS: 2.9 mg/dL (ref 2.4–5.1)

## 2024-04-01 LAB — HEPATIC FUNCTION PANEL
ALBUMIN: 2.1 g/dL — ABNORMAL LOW (ref 3.4–5.0)
ALKALINE PHOSPHATASE: 96 U/L (ref 46–116)
ALT (SGPT): 21 U/L (ref 10–49)
AST (SGOT): 35 U/L — ABNORMAL HIGH (ref <=34)
BILIRUBIN DIRECT: 0.7 mg/dL — ABNORMAL HIGH (ref 0.00–0.30)
BILIRUBIN TOTAL: 1.3 mg/dL — ABNORMAL HIGH (ref 0.3–1.2)
PROTEIN TOTAL: 4.5 g/dL — ABNORMAL LOW (ref 5.7–8.2)

## 2024-04-01 LAB — SODIUM
SODIUM: 145 mmol/L (ref 135–145)
SODIUM: 145 mmol/L (ref 135–145)

## 2024-04-01 LAB — BASIC METABOLIC PANEL
ANION GAP: 9 mmol/L (ref 5–14)
BLOOD UREA NITROGEN: 15 mg/dL (ref 9–23)
BUN / CREAT RATIO: 30
CALCIUM: 8.2 mg/dL — ABNORMAL LOW (ref 8.7–10.4)
CHLORIDE: 106 mmol/L (ref 98–107)
CO2: 30 mmol/L (ref 20.0–31.0)
CREATININE: 0.5 mg/dL — ABNORMAL LOW (ref 0.55–1.02)
EGFR CKD-EPI (2021) FEMALE: >90 mL/min/1.73m2 (ref >=60)
GLUCOSE RANDOM: 128 mg/dL (ref 70–179)
POTASSIUM: 3.9 mmol/L (ref 3.4–4.8)
SODIUM: 145 mmol/L (ref 135–145)

## 2024-04-01 LAB — MAGNESIUM: MAGNESIUM: 1.9 mg/dL (ref 1.6–2.6)

## 2024-04-01 MED ADMIN — folic acid (FOLVITE) tablet 1 mg: 1 mg | GASTROENTERAL | @ 13:00:00

## 2024-04-01 MED ADMIN — thiamine mononitrate (vit B1) tablet 100 mg: 100 mg | GASTROENTERAL | @ 13:00:00

## 2024-04-01 MED ADMIN — cyanocobalamin (vitamin B-12) tablet 1,000 mcg: 1000 ug | GASTROENTERAL | @ 13:00:00

## 2024-04-01 MED ADMIN — pantoprazole (Protonix) oral suspension: 40 mg | GASTROENTERAL | @ 02:00:00

## 2024-04-01 MED ADMIN — pantoprazole (Protonix) oral suspension: 40 mg | GASTROENTERAL | @ 13:00:00

## 2024-04-01 MED ADMIN — multivitamins, therapeutic with minerals tablet 1 tablet: 1 | GASTROENTERAL | @ 13:00:00

## 2024-04-01 MED ADMIN — lactulose oral solution: 40 g | GASTROENTERAL | @ 02:00:00

## 2024-04-01 MED ADMIN — lactulose oral solution: 40 g | GASTROENTERAL | @ 13:00:00

## 2024-04-01 MED ADMIN — enoxaparin (LOVENOX) syringe 80 mg: 80 mg | SUBCUTANEOUS | @ 02:00:00

## 2024-04-01 MED ADMIN — enoxaparin (LOVENOX) syringe 80 mg: 80 mg | SUBCUTANEOUS | @ 13:00:00 | Stop: 2024-04-01

## 2024-04-01 MED ADMIN — potassium chloride (KLOR-CON) packet 20 mEq: 20 meq | ORAL | @ 12:00:00 | Stop: 2024-04-01

## 2024-04-01 MED ADMIN — sodium phosphate 40 mmol in dextrose 5 % 500 mL IVPB: 40 mmol | INTRAVENOUS | @ 12:00:00 | Stop: 2024-04-01

## 2024-04-01 MED ADMIN — magnesium oxide (MAG-OX) tablet 400 mg: 400 mg | ORAL | @ 12:00:00 | Stop: 2024-04-01

## 2024-04-01 NOTE — Plan of Care (Signed)
 Pt ETT secure and patent, small pink tinged secretions upon suction. PT passed SBT continues to have barrier of AMS, halting extubation. NAD noted at this time.   Problem: Mechanical Ventilation Invasive  Goal: Effective Communication  Outcome: Ongoing - Unchanged  Goal: Optimal Device Function  Outcome: Ongoing - Unchanged  Intervention: Optimize Device Care and Function  Recent Flowsheet Documentation  Taken 04/01/2024 0145 by Jolaine Rosaline CROME, RRT  Airway/Ventilation Management: humidification applied  Goal: Mechanical Ventilation Liberation  Outcome: Ongoing - Unchanged  Goal: Absence of Device-Related Skin and Tissue Injury  Outcome: Ongoing - Unchanged  Goal: Absence of Ventilator-Induced Lung Injury  Outcome: Ongoing - Unchanged  Intervention: Prevent Ventilator-Associated Pneumonia  Recent Flowsheet Documentation  Taken 04/01/2024 0145 by Jolaine Rosaline CROME, RRT  Head of Bed [facility]) Positioning: HOB at 30-45 degrees

## 2024-04-01 NOTE — Plan of Care (Signed)
 Problem: Mechanical Ventilation Invasive  Goal: Effective Communication  Outcome: Resolved  Goal: Optimal Device Function  Outcome: Resolved  Intervention: Optimize Device Care and Function  Recent Flowsheet Documentation  Taken 04/01/2024 9176 by Dewight Counts D, RRT  Oral Care:   teeth brushed   tongue brushed   suction provided   mouth swabbed  Goal: Mechanical Ventilation Liberation  Outcome: Resolved  Goal: Optimal Nutrition Delivery  Outcome: Resolved  Goal: Absence of Device-Related Skin and Tissue Injury  Outcome: Resolved  Goal: Absence of Ventilator-Induced Lung Injury  Outcome: Resolved  Intervention: Prevent Ventilator-Associated Pneumonia  Recent Flowsheet Documentation  Taken 04/01/2024 0823 by Dewight Counts D, RRT  Oral Care:   teeth brushed   tongue brushed   suction provided   mouth swabbed

## 2024-04-01 NOTE — Procedures (Signed)
 Small Bore Feeding Tube Procedure  Note      DATE OF SERVICE: 04/01/2024    PROCEDURE:  Insertion of small bore feeding tube    INDICATION: gastric feeding    TIME OUT:     correct patient, side, site, procedure, position, equipment Yes    POSITION:            supine      SITE:                     right    Size:    29F    CORTRAK DEVICE USED: yes         DESCRIPTION:     Patient positioned in accordance with hospital protocol for tube placement, the front of the receiver unit was placed over the xiphoid process. The distal end of the stylet was connected to the monitor. The feeding tube (containing the stylet) was inserted via the nostril into the stomach or small intestine and advanced using visual guidance from monitor display. End of feeding tube is currently post pyloric per image noted on Cortrak monitor. KUB ordered for confirmation of tube.     SECUREMENT METHOD: Bridle    COMPLICATIONS:  none    SIGNATURE:             Krosby Ritchie D Tayler Lassen, RN

## 2024-04-01 NOTE — Plan of Care (Signed)
 Shift Summary  potassium chloride , magnesium  oxide, and sodium phosphate IVPB were administered at the start of the shift.  ETT was repositioned and secured twice during the shift, with vent status confirmed and equal bilateral breath sounds maintained.  OT session focused on therapeutic activity and self-care training, with discharge planning recommendations made for post-acute care.  Pain was assessed and documented as 0 at the end of the shift.  Overall, skin integrity was supported, ventilator parameters remained stable, and total assistance was required for ADLs throughout the shift.    Skin Health and Integrity: Bruising was present throughout the shift and Braden score remained low, but no signs of injury were noted during restraint monitoring and regular repositioning was performed to support skin integrity.    Improved Ability to Complete Activities of Daily Living: Total dependent assistance was required for all ADLs, with no improvement in self-care or mobility during OT sessions.    Absence of Fall and Fall-Related Injury: Hourly visual checks confirmed in-bed status and scheduled toileting was maintained, with side rails up at all times and no fall-related injuries documented.    Effective Communication: Less than 10% of one-step commands were followed during OT, but visual tracking and eye opening were observed; reaching toward ETT required increased cues and restraints.    Absence of Ventilator-Induced Lung Injury: Ventilator settings and exhaled tidal volumes remained stable within target ranges, and ETT placement was maintained with equal bilateral breath sounds and a dry site condition throughout the shift.

## 2024-04-01 NOTE — Progress Notes (Signed)
 MICU Daily Progress Note     Date of Service: 04/01/2024    Problem List:   Principal Problem:    Bacteremia, escherichia coli  Active Problems:    Alcoholic liver disease (HHS-HCC)    HTN (hypertension)    Depression with anxiety    Class 2 severe obesity with serious comorbidity in adult    Atrial fibrillation    (CMS-HCC)    Pleural effusion    Hypernatremia    Thrombocytopenia    Cirrhosis    (CMS-HCC)    A-fib (CMS-HCC)    Hepatic encephalopathy    (CMS-HCC)      Interval history: [name] is a 78 y.o. female with HFpEF, HTN, Afib on AC, MASLD cirrhosis, originally hospitalized for scheduled TEE/DCCV (aborted d/t LAA clot) c/b AHHRF, encephalopathy and septic shock requiring MICU care, was s/p extubation transferred to [facility] for further management of anticoagulation and hypernatremia. Transferred back to [facility] MICU for closer monitoring for respiratory status, pressor requirement, GI bleed, and management of carotid artery injury.     On 10/24, patient had acute worsening of respiratory status leading requiring intubation. When patient was given sedation for intubation, she coded and promptly had ROSC after a few minutes of CPR. Subsequently, CVC placed in neck, used to draw blood, unclear if given pressors through it, then found  to be in arterial system based on blood gas and imaging, likely right carotid. Line was subsequently removed, and pressure was held for 15 minutes, hemostasis with no hematoma. She was transferred to [facility] MICU for management of this carotid injury, to be evaluated by vascular surgery while also managing her increased vasopressor requirement and respiratory distress.  Has been weaned off pressors since 10/27 at 1 PM. VBG's indicating that she is ready for extubation, however her mentation suggests that she is not able to protect her airway    24 hour events:  - Continues without pressor support  - Passing SBTs. Still w c/f for extubation based on mentation.  - Dec lactulose  yesterday, 7 stools recorded  - Sodium has normalized  - Continued improvement in MS, following commands more consistently       Neurological   Analgosedation (off all sedation currently)  Altered mental status with history of hepatic encephalopathy  Per chart review at [facility], rapid response called on 10/24 for increased somnolence. At the time, differential included hepatic encephalopathy, toxic metabolic encephalopathy in the setting of shock (possibly sepsis), versus intracranial pathology. She was subsequently intubated. CT head on 10/24 showed no acute intracranial abnormality. Weaned off sedation with slow improvements in neurological status.  Plan to extubate when mental status improves.  - Goal RASS 0 to -1  - Continue lactulose   - treating Hypernatremia as below which may also be contributing    Analgesia: No pain issues  RASS at goal? Yes  [place] Agitation Assessment Scale (RASS) : -2 (04/01/2024 12:00 AM)    Pulmonary   Acute Hypoxic Hypercarbic Respiratory Failure  Rapid response called on 10/24 for increased somnolence, found to have acute hypercarbic respiratory failure and subsequently intubated. Suspect hypercarbic respiratory failure due to encephalopathy as above. CXR on 10/24 showed worsening pleural effusions. Not currently getting diuresed. Given patient's hypoxia, and AMS, there was concern for infectious etiology. MRSA nares negative. Infectious workup thus far is unremarkable. SBTs show readiness extubation based on VBGs, however there is concern that she continues to be unable to protect her airway due to her current mentation   -  S/p Cefepime & Vanc, not currently on antibiotics   - Extubate pending improving mentation, likely within next 24 hours   - LRCx with OP flora only    Vent Mode: PSV-CPAP  S RR:  [15] 15  FiO2 (%):  [30 %] 30 %  PC Set:  [15] 15  PR SUP:  [5 cm H20-10 cm H20] 5 cm H20  O2 Device: Ventilator    Cardiovascular   Carotid Artery Injury (resolved)  At [facility], CVC placed intended for RIJ on 10/24 following intubation, PEA, ROSC. Was used with known blood return. Unclear if it was used for pressor support, but highly likely that it was. CXR for placement check showed concern for intra aterial location, and blood gas confirmed it was arterial. Thought to be in carotid artery. CVC removed with pressure held for 15 minutes, with no hematoma formation. On arrival, no physical exam and bedside ultrasound showed no large hematoma. No active bleeding.   - Vascular Surgery consulted, stated fistula has healed based on Ultrasound findings    PEA arrest s/p ROSC  Hypovolemic/Hemorrhagic shock (resolved)  Atrial fibrillation  known left atrial appendage clot  HFpEF  Patient with PEA arrest peri-intubation with ROSC. Shock thought to be hemorrhagic in the setting of GI bleed History of atrial fibrillation with known LAA clot and HFpEF. Vasopressors were weaned off 10/27. APTT was elevated the morning of 10/28 necessitating changing anticoagulation from therapeutic heparin to therapeutic Lovenox .   - Holding Coreg   - Resume home Eliquis for anticoagulation for LAA clot    Renal   Hypernatremia (improving)  At [facility], had persistently hypernatremia near 153. Had been treated with D5 gtt. Sodium throughout admission has largely been 146-148. Sodium was 151 morning of 10/28, presume this will decrease once we start tube feeds.   - Strict I/O's  - Sodium check q6hours   - FWF with TF, adjusting as needed    Infectious Disease/Autoimmune   E. Coli Bacteremia (resolved),   Initially admitted for afib, found to have E. coli bacteremia on October 10. This was treated with cefazolin . Rapid response called 10/24, patient noted to be hypothermic. WBCs jumped from 3.0 to 9.7 on 10/24. Although the most recent lab was following intubation and CPR. UA with pyuria, rare bacteria, hematuria. Peri-intubation, patient developed significant hypotension requiring initiation of NE and vasopressin. Suspect possible septic shock with unknown infectious source. Patient started on vancomycin and cefepime 10/24. CT Abdomen pelvis concerning for aspiration pneumonia. Following infectious workup was unremarkable. Antibiotics were stopped on 10/27.   - follow blood, urine cultures to final    Cultures:  Blood Culture, Routine (no units)   Date Value   03/25/2024 No Growth at 5 days   03/25/2024 No Growth at 5 days     Urine Culture, Comprehensive (no units)   Date Value   03/25/2024 <10,000 CFU/mL     Lower Respiratory Culture (no units)   Date Value   03/29/2024 Specimen Not Processed   03/27/2024 OROPHARYNGEAL FLORA ISOLATED     WBC (10*9/L)   Date Value   04/01/2024 6.5     WBC, UA (/HPF)   Date Value   03/25/2024 25 (H)       FEN/GI   Sigmoid Mass c/f Malignancy and Hematochezia (resolved)  Hemorrhagic Shock (resolved)  CT abdomen pelvis done 10/24 with evidence of cirrhosis and masslike thickening of the lower sigmoid colon concerning for malignancy.  CTA abdomen pelvis performed 1024 with active extravasation into the  gastric fundus possibly secondary to traumatic enteric tube placement.  GI scoped the patient on 10/25 and clipped an area of active bleeding.  They removed a total of 1.5 L of blood.  Hemoglobin 10/20 6 AM downtrended to 6.0, so 2 units of blood transfused.  GI stated this was consistent with 10/25 scope and likely did not represent increased bleeding. Hb steadily improving with Hb of 9.2 on 10/28.   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 Given shock of unclear etiology. CTAP demonstrating cirrhosis. Likely that cirrhosis is contributing to hypotension.   - Continue lactulose ; target 3-5 BMs daily  - NGT in place, on TF and FWF  - Daily MELD labs and CMP     MELD 3.0: 14 at 03/27/2024  5:19 AM  MELD-Na: 11 at 03/27/2024  5:19 AM  Calculated from:  Serum Creatinine: 0.99 mg/dL (Using min of 1 mg/dL) at 89/73/7974  4:80 AM  Serum Sodium: 146 mmol/L (Using max of 137 mmol/L) at 03/27/2024  5:19 AM  Total Bilirubin: 2.2 mg/dL at 89/73/7974  4:80 AM  Serum Albumin: 2.8 g/dL at 89/73/7974  4:80 AM  INR(ratio): 1.19 at 03/25/2024 10:27 PM  Age at listing (hypothetical): 78 years  Sex: Female at 03/27/2024  5:19 AM    Provider Malnutrition Assessment:  Body mass index is 34.8 kg/m??. BMI Interpretation: >/= 30 and < 40, consistent with obesity, clinically significant requiring additional resources and complicating multiple aspects of patient care.  GLIM criteria:   Pt does not meet criteria  -I have screened this patient for malnutrition and they did NOT meet criteria for malnutrition based on GLIM criteria.  -TF and FWF; Dietician consulted, appreciate assistance    Heme/Coag   Acute blood loss anemia, resolving  Hemoglobin down trended to 6.0 on the morning of 10/26 thought to be secondary to acute GI bleed that was scoped and clipped 10/25. Patient received 2 units PRBC 10/26 and an additional unit 10/27. Hb steadily improving with Hb of 9.2 on 10/28.   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 Reason: Not indicated.    Prophylaxis/LDA/Restraints/Consults   ICU Checklist completed: yes (see ICU rounding navigator in Epic)    Patient Lines/Drains/Airways Status       Active Active Lines, Drains, & Airways       Name Placement date Placement time Site Days    ETT  7.5 03/25/24  1317  -- 6    CVC Triple Lumen 03/25/24 Non-tunneled Left Internal jugular 03/25/24  2316  Internal jugular  6    NG/OG Tube 18 Fr. Center mouth 03/25/24  1800  Center mouth  6    Urethral Catheter Temperature probe;Non-latex 03/25/24  1354 Temperature probe;Non-latex  6    Peripheral IV 03/20/24 Anterior;Left;Upper Arm 03/20/24  1730  Arm  11                  Patient Lines/Drains/Airways Status       Active Wounds       Name Placement date Placement time Site Days  Wound 03/01/24 Other (Comment) Toe (Comment which one) Left;Other (Comment) Blister present on arrival, tx already in outpatient setting, in process of healing, surrounding erythema 03/01/24  0129  Toe (Comment which one)  31    Wound 03/13/24 Irritant Contact Dermatitis Incontinence Sacrum Mid gluteal cleft MASD? 03/13/24  --  Sacrum  19                  Goals of Care     Code Status:   Orders Placed This Encounter   Procedures    Full Code     Standing Status:   Standing     Number of Occurrences:   1        Designated Healthcare Decision Maker:  Ms. Steinbach designated healthcare decision maker(s) is/are   HCDM (patient stated preference): Borras,[name] - Spouse - [phone]    HCDM, First AlternateNadean, Montanaro - Daughter - [phone]. See HCDM section of Epic sidebar/storyboard or ACP tab in patient chart for details regarding active HCDMs and patient capacity for decision-making.      Subjective     Patient is resting comfortably in bed intubated on the ventilator.  Did not rouse to voice or noxious stimuli.     Objective     Vitals - past 24 hours  Pulse:  [85-115] 96  SpO2 Pulse:  [72-115] 95  Resp:  [13-27] 21  BP: (108-153)/(54-95) 138/66  FiO2 (%):  [30 %] 30 %  SpO2:  [94 %-100 %] 99 % Intake/Output  I/O last 3 completed shifts:  In: 5530 [I.V.:60; NG/GT:5010; IV Piggyback:460]  Out: 1030 [Urine:1030]     Physical Exam:    General: intubated, opening eyes spontaneously  HEENT: normocephalic, atraumatic   CV: pulses palpable, regular borderline tachycardia   Pulm: intubated, equal expansion. Rhonchi w transmitted upper airway sounds throughout  GI: soft, NTND, + BS  MSK: 2+ pitting edema noted to her knees bilaterally, no clubbing or cyanosis  Skin: numerous large flat violaceous ecchymosis on left arm, chest, neck bilaterally.   Neuro: Withdrawals to painful stimuli. Responding to name and now reliably following commands.     Continuous Infusions:   Infusions Meds[1]    Scheduled Medications:   Scheduled Medications[2]    PRN medications:  PRN Medications[3]    Data/Imaging Review: Reviewed in Epic and personally interpreted on 04/01/2024. See EMR for detailed results.      Eamonn Sermeno C. Dorise, MD  PGY-2, Internal Medicine         [1] [2]    cyanocobalamin (vitamin B-12)  1,000 mcg Enteral tube: gastric Daily    enoxaparin (LOVENOX) injection  80 mg Subcutaneous Q12H SCH    flu vac 2025 [id number](PF)  0.5 mL Intramuscular During hospitalization    folic acid   1 mg Enteral tube: gastric Daily    lactulose   40 g Enteral tube: gastric TID    magnesium  oxide  400 mg Oral Once    multivitamins (ADULT)  1 tablet Enteral tube: gastric Daily    pantoprazole  40 mg Enteral tube: gastric BID    potassium chloride   20 mEq Oral Once    sodium phosphate  40 mmol Intravenous Once    thiamine  mononitrate (vit B1)  100 mg Enteral tube: gastric Daily   [3] acetaminophen , docusate sodium, fentaNYL  (PF) **OR** fentaNYL  (PF)

## 2024-04-01 NOTE — Plan of Care (Signed)
 Pt following commands, and opening eyes to voice throughout shift. Pt purposefully reaching towards ETT, restraints in place. Lactulose  dosed, 3-4 BM's. Bath complete.

## 2024-04-02 LAB — BASIC METABOLIC PANEL
ANION GAP: 7 mmol/L (ref 5–14)
BLOOD UREA NITROGEN: 12 mg/dL (ref 9–23)
BUN / CREAT RATIO: 29
CALCIUM: 8.5 mg/dL — ABNORMAL LOW (ref 8.7–10.4)
CHLORIDE: 105 mmol/L (ref 98–107)
CO2: 30 mmol/L (ref 20.0–31.0)
CREATININE: 0.41 mg/dL — ABNORMAL LOW (ref 0.55–1.02)
EGFR CKD-EPI (2021) FEMALE: >90 mL/min/1.73m2 (ref >=60)
GLUCOSE RANDOM: 153 mg/dL (ref 70–179)
POTASSIUM: 4 mmol/L (ref 3.4–4.8)
SODIUM: 142 mmol/L (ref 135–145)

## 2024-04-02 LAB — CBC W/ AUTO DIFF
BASOPHILS ABSOLUTE COUNT: 0 10*9/L (ref 0.0–0.1)
BASOPHILS RELATIVE PERCENT: 0.3 %
EOSINOPHILS ABSOLUTE COUNT: 0.3 10*9/L (ref 0.0–0.5)
EOSINOPHILS RELATIVE PERCENT: 4.3 %
HEMATOCRIT: 29.6 % — ABNORMAL LOW (ref 34.0–44.0)
HEMOGLOBIN: 10 g/dL — ABNORMAL LOW (ref 11.3–14.9)
LYMPHOCYTES ABSOLUTE COUNT: 1.2 10*9/L (ref 1.1–3.6)
LYMPHOCYTES RELATIVE PERCENT: 17.2 %
MEAN CORPUSCULAR HEMOGLOBIN CONC: 33.8 g/dL (ref 32.0–36.0)
MEAN CORPUSCULAR HEMOGLOBIN: 32.6 pg — ABNORMAL HIGH (ref 25.9–32.4)
MEAN CORPUSCULAR VOLUME: 96.5 fL — ABNORMAL HIGH (ref 77.6–95.7)
MEAN PLATELET VOLUME: 8.7 fL (ref 6.8–10.7)
MONOCYTES ABSOLUTE COUNT: 1.4 10*9/L — ABNORMAL HIGH (ref 0.3–0.8)
MONOCYTES RELATIVE PERCENT: 19.3 %
NEUTROPHILS ABSOLUTE COUNT: 4.1 10*9/L (ref 1.8–7.8)
NEUTROPHILS RELATIVE PERCENT: 58.9 %
PLATELET COUNT: 124 10*9/L — ABNORMAL LOW (ref 150–450)
RED BLOOD CELL COUNT: 3.07 10*12/L — ABNORMAL LOW (ref 3.95–5.13)
RED CELL DISTRIBUTION WIDTH: 23.1 % — ABNORMAL HIGH (ref 12.2–15.2)
WBC ADJUSTED: 7 10*9/L (ref 3.6–11.2)

## 2024-04-02 LAB — HEPATIC FUNCTION PANEL
ALBUMIN: 2.4 g/dL — ABNORMAL LOW (ref 3.4–5.0)
ALKALINE PHOSPHATASE: 108 U/L (ref 46–116)
ALT (SGPT): 28 U/L (ref 10–49)
AST (SGOT): 40 U/L — ABNORMAL HIGH (ref <=34)
BILIRUBIN DIRECT: 0.6 mg/dL — ABNORMAL HIGH (ref 0.00–0.30)
BILIRUBIN TOTAL: 1.3 mg/dL — ABNORMAL HIGH (ref 0.3–1.2)
PROTEIN TOTAL: 5.2 g/dL — ABNORMAL LOW (ref 5.7–8.2)

## 2024-04-02 LAB — MAGNESIUM: MAGNESIUM: 1.8 mg/dL (ref 1.6–2.6)

## 2024-04-02 LAB — PHOSPHORUS: PHOSPHORUS: 2.5 mg/dL (ref 2.4–5.1)

## 2024-04-02 MED ADMIN — folic acid (FOLVITE) tablet 1 mg: 1 mg | GASTROENTERAL | @ 12:00:00

## 2024-04-02 MED ADMIN — thiamine mononitrate (vit B1) tablet 100 mg: 100 mg | GASTROENTERAL | @ 12:00:00

## 2024-04-02 MED ADMIN — cyanocobalamin (vitamin B-12) tablet 1,000 mcg: 1000 ug | GASTROENTERAL | @ 12:00:00

## 2024-04-02 MED ADMIN — pantoprazole (Protonix) oral suspension: 40 mg | GASTROENTERAL | @ 01:00:00

## 2024-04-02 MED ADMIN — pantoprazole (Protonix) oral suspension: 40 mg | GASTROENTERAL | @ 12:00:00

## 2024-04-02 MED ADMIN — multivitamins, therapeutic with minerals tablet 1 tablet: 1 | GASTROENTERAL | @ 12:00:00

## 2024-04-02 MED ADMIN — lactulose oral solution: 40 g | GASTROENTERAL | @ 18:00:00

## 2024-04-02 MED ADMIN — lactulose oral solution: 40 g | GASTROENTERAL | @ 12:00:00

## 2024-04-02 MED ADMIN — lactulose oral solution: 40 g | GASTROENTERAL | @ 01:00:00

## 2024-04-02 MED ADMIN — apixaban (ELIQUIS) tablet 5 mg: 5 mg | GASTROENTERAL | @ 01:00:00

## 2024-04-02 MED ADMIN — apixaban (ELIQUIS) tablet 5 mg: 5 mg | GASTROENTERAL | @ 12:00:00

## 2024-04-02 NOTE — Plan of Care (Signed)
 Confused. Not FC. [place]. NSR/Afib. Normotensive. Afebrile. TF @ goal. UOP documented. No BM during care.    Problem: Skin Injury Risk Increased  Goal: Skin Health and Integrity  Intervention: Optimize Skin Protection  Recent Flowsheet Documentation  Taken 04/02/2024 0400 by Yancy Lucie BRAVO, RN  Head of Bed [facility]) Positioning: HOB at 30 degrees  Taken 04/02/2024 0200 by Yancy Lucie BRAVO, RN  Pressure Reduction Techniques:   frequent weight shift encouraged   heels elevated off bed   pressure points protected   weight shift assistance provided  Head of Bed (HOB) Positioning: HOB at 30 degrees  Pressure Reduction Devices:   heel offloading device utilized   positioning supports utilized   pressure-redistributing mattress utilized  Skin Protection:   adhesive use limited   cleansing with dimethicone incontinence wipes   incontinence pads utilized   transparent dressing maintained   tubing/devices free from skin contact     Problem: Adult Inpatient Plan of Care  Goal: Absence of Hospital-Acquired Illness or Injury  Intervention: Identify and Manage Fall Risk  Recent Flowsheet Documentation  Taken 04/02/2024 0200 by Yancy Lucie BRAVO, RN  Safety Interventions:   aspiration precautions   fall reduction program maintained   environmental modification   lighting adjusted for tasks/safety   nonskid shoes/slippers when out of bed  Intervention: Prevent Skin Injury  Recent Flowsheet Documentation  Taken 04/02/2024 0600 by Yancy Lucie BRAVO, RN  Positioning for Skin: Right  Taken 04/02/2024 0400 by Yancy Lucie BRAVO, RN  Positioning for Skin: Left  Taken 04/02/2024 0200 by Yancy Lucie BRAVO, RN  Positioning for Skin: Right  Device Skin Pressure Protection:   absorbent pad utilized/changed   adhesive use limited  Skin Protection:   adhesive use limited   cleansing with dimethicone incontinence wipes   incontinence pads utilized   transparent dressing maintained   tubing/devices free from skin contact  Intervention: Prevent Infection  Recent Flowsheet Documentation  Taken 04/02/2024 0200 by Yancy Lucie BRAVO, RN  Infection Prevention:   hand hygiene promoted   personal protective equipment utilized   rest/sleep promoted     Problem: Breathing Pattern Ineffective  Goal: Effective Breathing Pattern  Intervention: Promote Improved Breathing Pattern  Recent Flowsheet Documentation  Taken 04/02/2024 0400 by Yancy Lucie BRAVO, RN  Head of Bed [facility]) Positioning: HOB at 30 degrees  Taken 04/02/2024 0200 by Yancy Lucie BRAVO, RN  Head of Bed [facility]) Positioning: HOB at 30 degrees     Problem: Fall Injury Risk  Goal: Absence of Fall and Fall-Related Injury  Intervention: Promote Injury-Free Environment  Recent Flowsheet Documentation  Taken 04/02/2024 0200 by Yancy Lucie BRAVO, RN  Safety Interventions:   aspiration precautions   fall reduction program maintained   environmental modification   lighting adjusted for tasks/safety   nonskid shoes/slippers when out of bed

## 2024-04-02 NOTE — Plan of Care (Signed)
 This shift, lactulose  continued as ordered. 4 BMs this shift. Patient drowsy, alert to self only. Follows commands, but impulsive. Bilateral hand mitts maintained for patient safety. Tube feeds and free water  flushes continued as ordered. Intermittently hypertensive, MDI aware. Otherwise vitals stable, oxygen saturations >95% on 3L [place]. Patient's clinical status improving, appropriate for intermediate level of care.     Problem: Adult Inpatient Plan of Care  Goal: Absence of Hospital-Acquired Illness or Injury  Intervention: Identify and Manage Fall Risk  Recent Flowsheet Documentation  Taken 04/02/2024 0800 by Terrilyn Damien CROME, RN BSN  Safety Interventions:   aspiration precautions   bleeding precautions  Intervention: Prevent Skin Injury  Recent Flowsheet Documentation  Taken 04/02/2024 1600 by Terrilyn Damien CROME, RN BSN  Positioning for Skin: Left  Skin Protection:   adhesive use limited   cleansing with dimethicone incontinence wipes   silicone foam dressing in place   pulse oximeter probe site changed  Taken 04/02/2024 1400 by Terrilyn Damien CROME, RN BSN  Positioning for Skin: Right  Taken 04/02/2024 1200 by Terrilyn Damien CROME, RN BSN  Positioning for Skin: Left  Skin Protection:   adhesive use limited   cleansing with dimethicone incontinence wipes   incontinence pads utilized   transparent dressing maintained   tubing/devices free from skin contact   zinc oxide barrier cream  Taken 04/02/2024 1000 by Terrilyn Damien CROME, RN BSN  Positioning for Skin: Right  Taken 04/02/2024 0800 by Terrilyn Damien CROME, RN BSN  Positioning for Skin: Left  Skin Protection:   cleansing with dimethicone incontinence wipes   adhesive use limited   incontinence pads utilized   tubing/devices free from skin contact   transparent dressing maintained   zinc oxide barrier cream   pulse oximeter probe site changed   skin sealant/moisture barrier applied  Intervention: Prevent and Manage VTE (Venous Thromboembolism) Risk  Recent Flowsheet Documentation  Taken 04/02/2024 1600 by Terrilyn Damien CROME, RN BSN  Anti-Embolism Device Type: SCD, Knee  Anti-Embolism Device Status: Off  Anti-Embolism Device Location: BLE  Taken 04/02/2024 1400 by Terrilyn Damien CROME, RN BSN  Anti-Embolism Device Type: SCD, Knee  Anti-Embolism Device Status: Off  Anti-Embolism Device Location: BLE  Taken 04/02/2024 1200 by Terrilyn Damien CROME, RN BSN  Anti-Embolism Device Type: SCD, Knee  Anti-Embolism Device Status: Off  Anti-Embolism Device Location: BLE  Taken 04/02/2024 1000 by Terrilyn Damien CROME, RN BSN  Anti-Embolism Device Type: SCD, Knee  Anti-Embolism Device Status: Off  Anti-Embolism Device Location: BLE  Taken 04/02/2024 0800 by Terrilyn Damien CROME, RN BSN  Anti-Embolism Device Type: SCD, Knee  Anti-Embolism Device Status: Off  Anti-Embolism Device Location: BLE  Intervention: Prevent Infection  Recent Flowsheet Documentation  Taken 04/02/2024 0800 by Terrilyn Damien CROME, RN BSN  Infection Prevention: cohorting utilized

## 2024-04-02 NOTE — Progress Notes (Signed)
 MICU Daily Progress Note     Date of Service: 04/02/2024    Problem List:   Principal Problem:    Bacteremia, escherichia coli  Active Problems:    Alcoholic liver disease (HHS-HCC)    HTN (hypertension)    Depression with anxiety    Class 2 severe obesity with serious comorbidity in adult    Atrial fibrillation    (CMS-HCC)    Pleural effusion    Hypernatremia    Thrombocytopenia    Cirrhosis    (CMS-HCC)    A-fib (CMS-HCC)    Hepatic encephalopathy    (CMS-HCC)      Interval history: GEORGIE EDUARDO is a 78 y.o. female with HFpEF, HTN, Afib on AC, MASLD cirrhosis, originally hospitalized for scheduled TEE/DCCV (aborted d/t LAA clot) c/b AHHRF, encephalopathy and septic shock requiring MICU care, was s/p extubation transferred to [facility] for further management of anticoagulation and hypernatremia. Transferred back to [facility] MICU for closer monitoring for respiratory status, pressor requirement, GI bleed, and management of carotid artery injury.     On 10/24, patient had acute worsening of respiratory status leading requiring intubation. When patient was given sedation for intubation, she coded and promptly had ROSC after a few minutes of CPR. Subsequently, CVC placed in neck, used to draw blood, unclear if given pressors through it, then found  to be in arterial system based on blood gas and imaging, likely right carotid. Line was subsequently removed, and pressure was held for 15 minutes, hemostasis with no hematoma. She was transferred to [facility] MICU for management of this carotid injury, to be evaluated by vascular surgery while also managing her increased vasopressor requirement and respiratory distress.  Has been weaned off pressors since 10/27 at 1 PM. VBG's indicating that she is ready for extubation, however her mentation suggests that she is not able to protect her airway    24 hour events:  - Continues without pressor support  - Extubated yesterday, now on 3L [place]  - Continued lactulose   - Continued improvement in mental status, following commands more consistently       Neurological   Hepatic encephalopathy  Per chart review at [facility], rapid response called on 10/24 for increased somnolence. At the time, differential included hepatic encephalopathy, toxic metabolic encephalopathy in the setting of shock (possibly sepsis), versus intracranial pathology. She was subsequently intubated. CT head on 10/24 showed no acute intracranial abnormality. Weaned off sedation with slow improvements in neurological status.  Plan to extubate when mental status improves.  - Continue lactulose   - treating Hypernatremia as below which may also be contributing    Analgesia: No pain issues  RASS at goal? N/A, not on sedation  [place] Agitation Assessment Scale (RASS) : -1 (04/02/2024  4:00 AM)    Pulmonary   Acute Hypoxic Hypercarbic Respiratory Failure (resolved, extubated 10/31)  Rapid response called on 10/24 for increased somnolence, found to have acute hypercarbic respiratory failure and subsequently intubated. Suspect hypercarbic respiratory failure due to encephalopathy as above. CXR on 10/24 showed worsening pleural effusions. Not currently getting diuresed. Given patient's hypoxia, and AMS, there was concern for infectious etiology. MRSA nares negative. Infectious workup thus far is unremarkable. SBTs show readiness extubation based on VBGs, however there is concern that she continues to be unable to protect her airway due to her current mentation   - wean supplemental oxygen for spO2 > 90%  - LRCx with OP flora only      Cardiovascular   Carotid Artery  Injury (resolved)  At [facility], CVC placed intended for RIJ on 10/24 following intubation, PEA, ROSC. Was used with known blood return. Unclear if it was used for pressor support, but highly likely that it was. CXR for placement check showed concern for intra aterial location, and blood gas confirmed it was arterial. Thought to be in carotid artery. CVC removed with pressure held for 15 minutes, with no hematoma formation. On arrival, no physical exam and bedside ultrasound showed no large hematoma. No active bleeding.   - Vascular Surgery consulted, stated fistula has healed based on Ultrasound findings    PEA arrest s/p ROSC  Hypovolemic/Hemorrhagic shock (resolved)  Atrial fibrillation  known left atrial appendage clot  HFpEF  Patient with PEA arrest peri-intubation with ROSC. Shock thought to be hemorrhagic in the setting of GI bleed History of atrial fibrillation with known LAA clot and HFpEF. Vasopressors were weaned off 10/27. APTT was elevated the morning of 10/28 necessitating changing anticoagulation from therapeutic heparin to therapeutic Lovenox .   - Holding Coreg   - Continue home Eliquis for anticoagulation for LAA clot    Renal   Hypernatremia (improving)  At [facility], had persistently hypernatremia near 153. Had been treated with D5 gtt. Sodium throughout admission has largely been 146-148. Sodium was 151 morning of 10/28, presume this will decrease once we start tube feeds.   - Strict I/O's  - Sodium check q6hours   - FWF with TF, adjusting as needed    Infectious Disease/Autoimmune   E. Coli Bacteremia (resolved),   Initially admitted for afib, found to have E. coli bacteremia on October 10. This was treated with cefazolin . Rapid response called 10/24, patient noted to be hypothermic. WBCs jumped from 3.0 to 9.7 on 10/24. Although the most recent lab was following intubation and CPR. UA with pyuria, rare bacteria, hematuria. Peri-intubation, patient developed significant hypotension requiring initiation of NE and vasopressin. Suspect possible septic shock with unknown infectious source. Patient started on vancomycin and cefepime 10/24. CT Abdomen pelvis concerning for aspiration pneumonia. Following infectious workup was unremarkable. Antibiotics were stopped on 10/27.   - follow blood, urine cultures to final    Cultures:  Blood Culture, Routine (no units)   Date Value 03/25/2024 No Growth at 5 days   03/25/2024 No Growth at 5 days     Urine Culture, Comprehensive (no units)   Date Value   03/25/2024 <10,000 CFU/mL     Lower Respiratory Culture (no units)   Date Value   03/29/2024 Specimen Not Processed   03/27/2024 OROPHARYNGEAL FLORA ISOLATED     WBC (10*9/L)   Date Value   04/02/2024 7.0     WBC, UA (/HPF)   Date Value   03/25/2024 25 (H)       FEN/GI   Sigmoid Mass c/f Malignancy and Hematochezia (resolved)  Hemorrhagic Shock (resolved)  CT abdomen pelvis done 10/24 with evidence of cirrhosis and masslike thickening of the lower sigmoid colon concerning for malignancy.  CTA abdomen pelvis performed 1024 with active extravasation into the gastric fundus possibly secondary to traumatic enteric tube placement.  GI scoped the patient on 10/25 and clipped an area of active bleeding.  They removed a total of 1.5 L of blood.  Hemoglobin 10/20 6 AM downtrended to 6.0, so 2 units of blood transfused.  GI stated this was consistent with 10/25 scope and likely did not represent increased bleeding. Hb steadily improving with Hb of 9.2 on 10/28.   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 Given shock of unclear etiology. CTAP demonstrating cirrhosis. Likely that cirrhosis is contributing to hypotension.   - Continue lactulose ; target 3-5 BMs daily  - NGT in place, on TF and FWF  - Daily MELD labs and CMP     MELD 3.0: 14 at 03/27/2024  5:19 AM  MELD-Na: 11 at 03/27/2024  5:19 AM  Calculated from:  Serum Creatinine: 0.99 mg/dL (Using min of 1 mg/dL) at 89/73/7974  4:80 AM  Serum Sodium: 146 mmol/L (Using max of 137 mmol/L) at 03/27/2024  5:19 AM  Total Bilirubin: 2.2 mg/dL at 89/73/7974  4:80 AM  Serum Albumin: 2.8 g/dL at 89/73/7974  4:80 AM  INR(ratio): 1.19 at 03/25/2024 10:27 PM  Age at listing (hypothetical): 78 years  Sex: Female at 03/27/2024  5:19 AM    Provider Malnutrition Assessment:  Body mass index is 39.22 kg/m??. BMI Interpretation: >/= 30 and < 40, consistent with obesity, clinically significant requiring additional resources and complicating multiple aspects of patient care.  GLIM criteria:   Pt does not meet criteria  -I have screened this patient for malnutrition and they did NOT meet criteria for malnutrition based on GLIM criteria.  -TF and FWF; Dietician consulted, appreciate assistance    Heme/Coag   Acute blood loss anemia  Hemoglobin down trended to 6.0 on the morning of 10/26 thought to be secondary to acute GI bleed that was scoped and clipped 10/25. Patient received 2 units PRBC 10/26 and an additional unit 10/27. Hb steadily improving with Hb of 9.2 on 10/28.   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 Reason: Not indicated.    Prophylaxis/LDA/Restraints/Consults   ICU Checklist completed: yes (see ICU rounding navigator in Epic)    Patient Lines/Drains/Airways Status       Active Active Lines, Drains, & Airways       Name Placement date Placement time Site Days    NG/OG Tube Feedings 10 Fr. Right nostril 04/01/24  1138  Right nostril  less than 1    External Urinary Device 04/02/24 With Suction 04/02/24  0200  -- less than 1    Peripheral IV 03/20/24 Anterior;Left;Upper Arm 03/20/24  1730  Arm  12                  Patient Lines/Drains/Airways Status       Active Wounds       Name Placement date Placement time Site Days    Wound 03/01/24 Other (Comment) Toe (Comment which one) Left;Other (Comment) Blister present on arrival, tx already in outpatient setting, in process of healing, surrounding erythema 03/01/24  0129  Toe (Comment which one)  32    Wound 03/13/24 Irritant Contact Dermatitis Incontinence Sacrum Mid gluteal cleft MASD? 03/13/24  --  Sacrum  20                  Goals of Care     Code Status:   Orders Placed This Encounter   Procedures    Full Code     Standing Status:   Standing  Number of Occurrences:   1        Designated Healthcare Decision Maker:  Ms. Graver designated healthcare decision maker(s) is/are   HCDM (patient stated preference): Suitt,[name] - Spouse - [phone]    HCDM, First AlternateRejina, Odle - Daughter - [phone]. See HCDM section of Epic sidebar/storyboard or ACP tab in patient chart for details regarding active HCDMs and patient capacity for decision-making.      Subjective     Patient is resting comfortably. Able to answer yes/no questions and following commands.    Objective     Vitals - past 24 hours  Temp:  [36.1 ??C (97 ??F)-37.2 ??C (98.9 ??F)] 36.1 ??C (97 ??F)  Pulse:  [91-114] 102  SpO2 Pulse:  [84-114] 103  Resp:  [18-33] 24  BP: (119-154)/(59-105) 137/59  FiO2 (%):  [30 %-100 %] 30 %  SpO2:  [95 %-100 %] 99 % Intake/Output  I/O last 3 completed shifts:  In: 3930 [NG/GT:3930]  Out: 1701 [Urine:951; Emesis/NG output:750]     Physical Exam:    General: intubated, opening eyes spontaneously  HEENT: normocephalic, atraumatic   CV: pulses palpable, regular borderline tachycardia   Pulm: Rhonchi w transmitted upper airway sounds throughout  GI: soft, NTND, + BS  MSK: 3+ pitting edema noted to her knees bilaterally, no clubbing or cyanosis  Skin: numerous large flat violaceous ecchymosis on left arm, chest, neck bilaterally.   Neuro: Withdrawals to painful stimuli. Responding to name and now reliably following commands.     Continuous Infusions:   Infusions Meds[1]    Scheduled Medications:   Scheduled Medications[2]    PRN medications:  PRN Medications[3]    Data/Imaging Review: Reviewed in Epic and personally interpreted on 04/02/2024. See EMR for detailed results.        Ulonda Klosowski S Rusty Glodowski, MD  PGY1 Internal Medicine, [facility]             [1] [2]    apixaban  5 mg Enteral tube: gastric BID    cyanocobalamin (vitamin B-12)  1,000 mcg Enteral tube: gastric Daily    flu vac 2025 [id number](PF)  0.5 mL Intramuscular During hospitalization    folic acid   1 mg Enteral tube: gastric Daily    lactulose   40 g Enteral tube: gastric TID    multivitamins (ADULT)  1 tablet Enteral tube: gastric Daily    pantoprazole  40 mg Enteral tube: gastric BID    thiamine  mononitrate (vit B1)  100 mg Enteral tube: gastric Daily   [3] acetaminophen , docusate sodium

## 2024-04-03 LAB — PROTIME-INR
INR: 1.61
PROTIME: 18.4 s — ABNORMAL HIGH (ref 9.9–12.6)

## 2024-04-03 LAB — BLOOD GAS CRITICAL CARE PANEL, ARTERIAL
BASE EXCESS ARTERIAL: 4.9 — ABNORMAL HIGH (ref -2.0–2.0)
BASE EXCESS ARTERIAL: 4 — ABNORMAL HIGH (ref -2.0–2.0)
BASE EXCESS ARTERIAL: 4.9 — ABNORMAL HIGH (ref -2.0–2.0)
CALCIUM IONIZED ARTERIAL (MG/DL): 4.75 mg/dL (ref 4.40–5.40)
CALCIUM IONIZED ARTERIAL (MG/DL): 4.31 mg/dL — ABNORMAL LOW (ref 4.40–5.40)
CALCIUM IONIZED ARTERIAL (MG/DL): 4.76 mg/dL (ref 4.40–5.40)
CARBOXYHEMOGLOBIN: 2.3 % — ABNORMAL HIGH (ref <1.2)
CARBOXYHEMOGLOBIN: 1.5 % — ABNORMAL HIGH (ref <1.2)
CARBOXYHEMOGLOBIN: 2.2 % — ABNORMAL HIGH (ref <1.2)
CHLORIDE, WHOLE BLOOD: 102 mmol/L (ref 98–107)
CHLORIDE, WHOLE BLOOD: 107 mmol/L (ref 98–107)
CHLORIDE, WHOLE BLOOD: 104 mmol/L (ref 98–107)
FIO2 ARTERIAL: 60
GLUCOSE WHOLE BLOOD: 133 mg/dL (ref 70–179)
GLUCOSE WHOLE BLOOD: 76 mg/dL (ref 70–179)
GLUCOSE WHOLE BLOOD: 112 mg/dL (ref 70–179)
HCO3 ARTERIAL: 29 mmol/L — ABNORMAL HIGH (ref 22–27)
HCO3 ARTERIAL: 28 mmol/L — ABNORMAL HIGH (ref 22–27)
HCO3 ARTERIAL: 28 mmol/L — ABNORMAL HIGH (ref 22–27)
HEMOGLOBIN BLOOD GAS: 7.8 g/dL — ABNORMAL LOW (ref 12.00–16.00)
HEMOGLOBIN BLOOD GAS: 6.9 g/dL — ABNORMAL LOW (ref 12.00–16.00)
HEMOGLOBIN BLOOD GAS: 7.5 g/dL — ABNORMAL LOW (ref 12.00–16.00)
LACTATE BLOOD ARTERIAL: <1 mmol/L (ref <1.3)
LACTATE BLOOD ARTERIAL: 1.1 mmol/L (ref <1.3)
LACTATE BLOOD ARTERIAL: 1 mmol/L (ref <1.3)
METHEMOGLOBIN: <1 % (ref <1.5)
METHEMOGLOBIN: <1 % (ref <1.5)
METHEMOGLOBIN: <1 % (ref <1.5)
O2 SATURATION ARTERIAL: >100 % — ABNORMAL HIGH (ref 94.0–100.0)
O2 SATURATION ARTERIAL: 98.7 % (ref 94.0–100.0)
O2 SATURATION ARTERIAL: >100 % — ABNORMAL HIGH (ref 94.0–100.0)
OXYHEMOGLOBIN: 97.6 % (ref 94.0–100.0)
OXYHEMOGLOBIN: 96.7 % (ref 94.0–100.0)
OXYHEMOGLOBIN: 97.4 % (ref 94.0–100.0)
PCO2 ARTERIAL: 44.1 mmHg (ref 35.0–45.0)
PCO2 ARTERIAL: 36.2 mmHg (ref 35.0–45.0)
PCO2 ARTERIAL: 37.6 mmHg (ref 35.0–45.0)
PH ARTERIAL: 7.43 (ref 7.35–7.45)
PH ARTERIAL: 7.49 — ABNORMAL HIGH (ref 7.35–7.45)
PH ARTERIAL: 7.49 — ABNORMAL HIGH (ref 7.35–7.45)
PO2 ARTERIAL: 160 mmHg — ABNORMAL HIGH (ref 80.0–110.0)
PO2 ARTERIAL: 87.8 mmHg (ref 80.0–110.0)
PO2 ARTERIAL: 142 mmHg — ABNORMAL HIGH (ref 80.0–110.0)
POTASSIUM WHOLE BLOOD: 4 mmol/L (ref 3.4–4.6)
POTASSIUM WHOLE BLOOD: 3.2 mmol/L — ABNORMAL LOW (ref 3.4–4.6)
POTASSIUM WHOLE BLOOD: 3.2 mmol/L — ABNORMAL LOW (ref 3.4–4.6)
SODIUM WHOLE BLOOD: 136 mmol/L (ref 135–145)
SODIUM WHOLE BLOOD: 139 mmol/L (ref 135–145)
SODIUM WHOLE BLOOD: 138 mmol/L (ref 135–145)

## 2024-04-03 LAB — HEPATIC FUNCTION PANEL
ALBUMIN: 2.8 g/dL — ABNORMAL LOW (ref 3.4–5.0)
ALKALINE PHOSPHATASE: 88 U/L (ref 46–116)
ALT (SGPT): 20 U/L (ref 10–49)
AST (SGOT): 29 U/L (ref <=34)
BILIRUBIN DIRECT: 0.7 mg/dL — ABNORMAL HIGH (ref 0.00–0.30)
BILIRUBIN TOTAL: 1.4 mg/dL — ABNORMAL HIGH (ref 0.3–1.2)
PROTEIN TOTAL: 5 g/dL — ABNORMAL LOW (ref 5.7–8.2)

## 2024-04-03 LAB — CBC W/ AUTO DIFF
BASOPHILS ABSOLUTE COUNT: 0 10*9/L (ref 0.0–0.1)
BASOPHILS RELATIVE PERCENT: 0.5 %
EOSINOPHILS ABSOLUTE COUNT: 0 10*9/L (ref 0.0–0.5)
EOSINOPHILS RELATIVE PERCENT: 1 %
HEMATOCRIT: 23.2 % — ABNORMAL LOW (ref 34.0–44.0)
HEMOGLOBIN: 7.6 g/dL — ABNORMAL LOW (ref 11.3–14.9)
LYMPHOCYTES ABSOLUTE COUNT: 0.7 10*9/L — ABNORMAL LOW (ref 1.1–3.6)
LYMPHOCYTES RELATIVE PERCENT: 15 %
MEAN CORPUSCULAR HEMOGLOBIN CONC: 32.8 g/dL (ref 32.0–36.0)
MEAN CORPUSCULAR HEMOGLOBIN: 31.9 pg (ref 25.9–32.4)
MEAN CORPUSCULAR VOLUME: 97.1 fL — ABNORMAL HIGH (ref 77.6–95.7)
MEAN PLATELET VOLUME: 8.5 fL (ref 6.8–10.7)
MONOCYTES ABSOLUTE COUNT: 0.9 10*9/L — ABNORMAL HIGH (ref 0.3–0.8)
MONOCYTES RELATIVE PERCENT: 18.4 %
NEUTROPHILS ABSOLUTE COUNT: 3.1 10*9/L (ref 1.8–7.8)
NEUTROPHILS RELATIVE PERCENT: 65.1 %
PLATELET COUNT: 111 10*9/L — ABNORMAL LOW (ref 150–450)
RED BLOOD CELL COUNT: 2.39 10*12/L — ABNORMAL LOW (ref 3.95–5.13)
RED CELL DISTRIBUTION WIDTH: 22.3 % — ABNORMAL HIGH (ref 12.2–15.2)
WBC ADJUSTED: 4.7 10*9/L (ref 3.6–11.2)

## 2024-04-03 LAB — PHOSPHORUS: PHOSPHORUS: 2.1 mg/dL — ABNORMAL LOW (ref 2.4–5.1)

## 2024-04-03 LAB — APTT
APTT: 34.4 s (ref 24.8–38.4)
HEPARIN CORRELATION: 0.2

## 2024-04-03 LAB — BASIC METABOLIC PANEL
ANION GAP: 10 mmol/L (ref 5–14)
BLOOD UREA NITROGEN: 20 mg/dL (ref 9–23)
BUN / CREAT RATIO: 45
CALCIUM: 8.5 mg/dL — ABNORMAL LOW (ref 8.7–10.4)
CHLORIDE: 100 mmol/L (ref 98–107)
CO2: 30 mmol/L (ref 20.0–31.0)
CREATININE: 0.44 mg/dL — ABNORMAL LOW (ref 0.55–1.02)
EGFR CKD-EPI (2021) FEMALE: >90 mL/min/1.73m2 (ref >=60)
GLUCOSE RANDOM: 129 mg/dL (ref 70–179)
POTASSIUM: 4 mmol/L (ref 3.4–4.8)
SODIUM: 140 mmol/L (ref 135–145)

## 2024-04-03 LAB — TRIGLYCERIDES: TRIGLYCERIDES: 48 mg/dL (ref <150)

## 2024-04-03 LAB — HEMOGLOBIN AND HEMATOCRIT, BLOOD
HEMATOCRIT: 22.1 % — ABNORMAL LOW (ref 34.0–44.0)
HEMOGLOBIN: 7.5 g/dL — ABNORMAL LOW (ref 11.3–14.9)

## 2024-04-03 LAB — MAGNESIUM: MAGNESIUM: 1.8 mg/dL (ref 1.6–2.6)

## 2024-04-03 MED ADMIN — folic acid (FOLVITE) tablet 1 mg: 1 mg | GASTROENTERAL | @ 13:00:00

## 2024-04-03 MED ADMIN — thiamine mononitrate (vit B1) tablet 100 mg: 100 mg | GASTROENTERAL | @ 13:00:00

## 2024-04-03 MED ADMIN — cyanocobalamin (vitamin B-12) tablet 1,000 mcg: 1000 ug | GASTROENTERAL | @ 13:00:00

## 2024-04-03 MED ADMIN — etomidate (AMIDATE) injection 31.2 mg: .3 mg/kg | INTRAVENOUS | @ 12:00:00 | Stop: 2024-04-03

## 2024-04-03 MED ADMIN — vasopressin 0.2 unit/mL: INTRAVENOUS | @ 12:00:00 | Stop: 2024-04-03

## 2024-04-03 MED ADMIN — pantoprazole (Protonix) oral suspension: 40 mg | GASTROENTERAL | @ 13:00:00

## 2024-04-03 MED ADMIN — pantoprazole (Protonix) oral suspension: 40 mg | GASTROENTERAL | @ 02:00:00

## 2024-04-03 MED ADMIN — multivitamins, therapeutic with minerals tablet 1 tablet: 1 | GASTROENTERAL | @ 13:00:00

## 2024-04-03 MED ADMIN — lactulose oral solution: 40 g | GASTROENTERAL | @ 13:00:00 | Stop: 2024-04-03

## 2024-04-03 MED ADMIN — apixaban (ELIQUIS) tablet 5 mg: 5 mg | GASTROENTERAL | @ 13:00:00

## 2024-04-03 MED ADMIN — apixaban (ELIQUIS) tablet 5 mg: 5 mg | GASTROENTERAL | @ 02:00:00

## 2024-04-03 MED ADMIN — fentaNYL (PF) (SUBLIMAZE) injection 25 mcg: 25 ug | INTRAVENOUS | @ 17:00:00 | Stop: 2024-04-10

## 2024-04-03 MED ADMIN — magnesium oxide (MAG-OX) tablet 400 mg: 400 mg | ORAL | @ 18:00:00 | Stop: 2024-04-03

## 2024-04-03 MED ADMIN — lactulose oral solution: 40 g | GASTROENTERAL | @ 22:00:00

## 2024-04-03 MED ADMIN — sodium phosphate 30 mmol in dextrose 5 % 250 mL IVPB: 30 mmol | INTRAVENOUS | @ 18:00:00 | Stop: 2024-04-03

## 2024-04-03 MED ADMIN — propofol (DIPRIVAN) infusion 10 mg/mL: 0-50 ug/kg/min | INTRAVENOUS | @ 18:00:00

## 2024-04-03 MED ADMIN — gadopiclenol (ELUCIREM,VUEWAY) injection 10 mL: 10 mL | INTRAVENOUS | @ 17:00:00 | Stop: 2024-04-03

## 2024-04-03 MED ADMIN — NORepinephrine 8 mg in dextrose 5 % 250 mL (32 mcg/mL) infusion PMB: 0-30 ug/min | INTRAVENOUS | @ 12:00:00

## 2024-04-03 MED ADMIN — vasopressin 20 units in 100 mL (0.2 units/mL) infusion premade vial: .03 [IU]/min | INTRAVENOUS | @ 12:00:00

## 2024-04-03 MED ADMIN — ROCuronium (ZEMURON) injection 103.7 mg: 1 mg/kg | INTRAVENOUS | @ 12:00:00 | Stop: 2024-04-03

## 2024-04-03 MED ADMIN — albumin human 25 % 50 g: 50 g | INTRAVENOUS | @ 13:00:00 | Stop: 2024-04-03

## 2024-04-03 MED ADMIN — phenylephrine 1 mg/10 mL (100 mcg/mL) injection Syrg: @ 12:00:00 | Stop: 2024-04-03

## 2024-04-03 MED ADMIN — NORepinephrine bitartrate-D5W 8 mg/250 mL (32 mcg/mL) infusion: INTRAVENOUS | @ 12:00:00 | Stop: 2024-04-03

## 2024-04-03 MED ADMIN — ROCuronium (ZEMURON) 10 mg/mL injection: INTRAVENOUS | @ 12:00:00 | Stop: 2024-04-03

## 2024-04-03 NOTE — Plan of Care (Signed)
 Pt remains on PRVC 320/20 +8 50. Ett secure and patent.  Emergency supplies at [facility]. MRI done today without any events.  Bronch done today, pt tolerated well.   Problem: Mechanical Ventilation Invasive  Goal: Effective Communication  Outcome: Ongoing - Unchanged  Goal: Optimal Device Function  Outcome: Ongoing - Unchanged  Goal: Mechanical Ventilation Liberation  Outcome: Ongoing - Unchanged  Goal: Optimal Nutrition Delivery  Outcome: Ongoing - Unchanged  Goal: Absence of Device-Related Skin and Tissue Injury  Outcome: Ongoing - Unchanged

## 2024-04-03 NOTE — Plan of Care (Signed)
 MRI on hold until screening form is completed in Epic. Please answer all of the questions in the form. If the patient is not able to answer the questions, please have an immediate family member help answer the questions and list their name and their phone number in the form. Patients need to be in a hospital gown changed down to underwear no bra if applicable. IV should be med locked and medication patches should be removed prior to coming to MRI. Please call MRI at [phone] if you have any questions. Thank you

## 2024-04-03 NOTE — Procedures (Signed)
 Central Venous Catheter Insertion Procedure Note     Date of Service: 04/03/2024    Patient Name: ARAYAH KROUSE  Patient MRN: [id number]    Line type:  Triple Lumen    Indications:  Medications requiring central access    Consent:  I explained the potential benefits and risks of the procedure, including  catheter malposition, pneumothorax, cardiac complications, thrombosis, hematoma formation, arterial cannulation, air embolism local/catheter-related infection, nerve/tissue damage and sepsis. I explained potential alternatives. The patient/HCDM understands these risks, agrees to the procedure, and signed the informed consent form.    Procedure Details:   Time-out was performed immediately prior to the procedure.    The left internal jugular vein was identified using bedside ultrasound. This area was prepped and draped in the usual sterile fashion. Maximum sterile technique was used including antiseptics, cap, gloves, gown, hand hygiene, mask, and sterile sheet.  The patient was placed in Trendelenburg position. Local anesthesia with 1% lidocaine  was applied subcutaneously then deep to the skin. The finder was then inserted into the internal jugular vein using ultrasound guidance.    Using the Seldinger Technique a Triple Lumen was placed with each port easily flushed and freely drawing venous blood.    The catheter was secured with sutures. A sterile CHG drsg was applied to the site.    Condition:  The patient tolerated the procedure well and remains in the same condition as pre-procedure.    Complications:  None; patient tolerated the procedure well.    Plan:  CXR confirmed appropriate placement of central line. CVAD order placed OK to use     Prentice JONETTA Staples, MD

## 2024-04-03 NOTE — Plan of Care (Signed)
 Intubated at change of shift for acidemia, hypercarbia, and inability to protect airway. Pt became increasingly somnolent in late morning portion of shift. Required assistance in suctioning to clear oral secretions.    Previously: Confused but alert/drowsy, intermittently FC. Afib. Normotensive. Afebrile. TF @ goal. UOP adequate. No BM during shift.    Problem: Skin Injury Risk Increased  Goal: Skin Health and Integrity  Intervention: Optimize Skin Protection  Recent Flowsheet Documentation  Taken 04/03/2024 0400 by Yancy Lucie BRAVO, RN  Head of Bed [facility]) Positioning: HOB at 30 degrees  Taken 04/02/2024 2000 by Yancy Lucie BRAVO, RN  Pressure Reduction Techniques:   frequent weight shift encouraged   heels elevated off bed   pressure points protected   weight shift assistance provided  Head of Bed (HOB) Positioning: HOB at 30 degrees  Pressure Reduction Devices:   heel offloading device utilized   positioning supports utilized   pressure-redistributing mattress utilized  Skin Protection:   adhesive use limited   cleansing with dimethicone incontinence wipes   incontinence pads utilized   transparent dressing maintained   tubing/devices free from skin contact     Problem: Adult Inpatient Plan of Care  Goal: Absence of Hospital-Acquired Illness or Injury  Intervention: Identify and Manage Fall Risk  Recent Flowsheet Documentation  Taken 04/02/2024 2000 by Yancy Lucie BRAVO, RN  Safety Interventions:   aspiration precautions   fall reduction program maintained   environmental modification   lighting adjusted for tasks/safety   nonskid shoes/slippers when out of bed  Intervention: Prevent Skin Injury  Recent Flowsheet Documentation  Taken 04/03/2024 0600 by Yancy Lucie BRAVO, RN  Positioning for Skin: Left  Taken 04/03/2024 0400 by Yancy Lucie BRAVO, RN  Positioning for Skin: Right  Taken 04/03/2024 0200 by Yancy Lucie BRAVO, RN  Positioning for Skin: Left  Taken 04/03/2024 0000 by Yancy Lucie BRAVO, RN  Positioning for Skin: Right  Taken 04/02/2024 2200 by Yancy Lucie BRAVO, RN  Positioning for Skin: Left  Taken 04/02/2024 2000 by Yancy Lucie BRAVO, RN  Positioning for Skin: Right  Device Skin Pressure Protection:   absorbent pad utilized/changed   adhesive use limited  Skin Protection:   adhesive use limited   cleansing with dimethicone incontinence wipes   incontinence pads utilized   transparent dressing maintained   tubing/devices free from skin contact  Intervention: Prevent Infection  Recent Flowsheet Documentation  Taken 04/02/2024 2000 by Yancy Lucie BRAVO, RN  Infection Prevention:   hand hygiene promoted   personal protective equipment utilized   rest/sleep promoted     Problem: Breathing Pattern Ineffective  Goal: Effective Breathing Pattern  Intervention: Promote Improved Breathing Pattern  Recent Flowsheet Documentation  Taken 04/03/2024 0400 by Yancy Lucie BRAVO, RN  Head of Bed [facility]) Positioning: HOB at 30 degrees  Taken 04/02/2024 2000 by Yancy Lucie BRAVO, RN  Head of Bed [facility]) Positioning: HOB at 30 degrees     Problem: Fall Injury Risk  Goal: Absence of Fall and Fall-Related Injury  Intervention: Promote Injury-Free Environment  Recent Flowsheet Documentation  Taken 04/02/2024 2000 by Yancy Lucie BRAVO, RN  Safety Interventions:   aspiration precautions   fall reduction program maintained   environmental modification   lighting adjusted for tasks/safety   nonskid shoes/slippers when out of bed     Problem: Gas Exchange Impaired  Goal: Optimal Gas Exchange  Intervention: Optimize Oxygenation and Ventilation  Recent Flowsheet Documentation  Taken 04/03/2024 0400 by Yancy Lucie BRAVO, RN  Head of Bed [facility]) Positioning:  HOB at 30 degrees  Taken 04/02/2024 2000 by Yancy Lucie BRAVO, RN  Head of Bed [facility]) Positioning: HOB at 30 degrees     Problem: Wound  Goal: Absence of Infection Signs and Symptoms  Intervention: Prevent or Manage Infection  Recent Flowsheet Documentation  Taken 04/02/2024 2000 by Yancy Lucie BRAVO, RN  Infection Management: aseptic technique maintained  Goal: Optimal Pain Control and Function  Intervention: Prevent or Manage Pain  Recent Flowsheet Documentation  Taken 04/02/2024 2000 by Yancy Lucie BRAVO, RN  Sleep/Rest Enhancement:   consistent schedule promoted   family presence promoted   natural light exposure provided   regular sleep/rest pattern promoted   relaxation techniques promoted   therapeutic touch utilized  Goal: Skin Health and Integrity  Intervention: Optimize Skin Protection  Recent Flowsheet Documentation  Taken 04/03/2024 0400 by Yancy Lucie BRAVO, RN  Head of Bed [facility]) Positioning: HOB at 30 degrees  Taken 04/02/2024 2000 by Yancy Lucie BRAVO, RN  Pressure Reduction Techniques:   frequent weight shift encouraged   heels elevated off bed   pressure points protected   weight shift assistance provided  Head of Bed (HOB) Positioning: HOB at 30 degrees  Pressure Reduction Devices:   heel offloading device utilized   positioning supports utilized   pressure-redistributing mattress utilized  Skin Protection:   adhesive use limited   cleansing with dimethicone incontinence wipes   incontinence pads utilized   transparent dressing maintained   tubing/devices free from skin contact  Goal: Optimal Wound Healing  Intervention: Promote Wound Healing  Recent Flowsheet Documentation  Taken 04/02/2024 2000 by Yancy Lucie BRAVO, RN  Sleep/Rest Enhancement:   consistent schedule promoted   family presence promoted   natural light exposure provided   regular sleep/rest pattern promoted   relaxation techniques promoted   therapeutic touch utilized     Problem: Mechanical Ventilation Invasive  Goal: Optimal Device Function  Intervention: Optimize Device Care and Function  Recent Flowsheet Documentation  Taken 04/02/2024 2130 by Yancy Lucie BRAVO, RN  Oral Care:   oral rinse provided   suction provided  Goal: Mechanical Ventilation Liberation  Intervention: Promote Extubation and Mechanical Ventilation Liberation  Recent Flowsheet Documentation  Taken 04/02/2024 2000 by Yancy Lucie BRAVO, RN  Sleep/Rest Enhancement:   consistent schedule promoted   family presence promoted   natural light exposure provided   regular sleep/rest pattern promoted   relaxation techniques promoted   therapeutic touch utilized  Goal: Absence of Device-Related Skin and Tissue Injury  Intervention: Maintain Skin and Tissue Health  Recent Flowsheet Documentation  Taken 04/02/2024 2000 by Yancy Lucie BRAVO, RN  Device Skin Pressure Protection:   absorbent pad utilized/changed   adhesive use limited  Goal: Absence of Ventilator-Induced Lung Injury  Intervention: Prevent Ventilator-Associated Pneumonia  Recent Flowsheet Documentation  Taken 04/03/2024 0400 by Yancy Lucie BRAVO, RN  Head of Bed [facility]) Positioning: HOB at 30 degrees  Taken 04/02/2024 2130 by Yancy Lucie BRAVO, RN  Oral Care:   oral rinse provided   suction provided  Taken 04/02/2024 2000 by Yancy Lucie BRAVO, RN  Head of Bed [facility]) Positioning: HOB at 30 degrees     Problem: Mechanical Ventilation Invasive  Goal: Optimal Device Function  Intervention: Optimize Device Care and Function  Recent Flowsheet Documentation  Taken 04/02/2024 2130 by Yancy Lucie BRAVO, RN  Oral Care:   oral rinse provided   suction provided  Goal: Mechanical Ventilation Liberation  Intervention: Promote Extubation and Careers Adviser  Recent Flowsheet Documentation  Taken 04/02/2024 2000 by Yancy Lucie BRAVO, RN  Sleep/Rest Enhancement:   consistent schedule promoted   family presence promoted   natural light exposure provided   regular sleep/rest pattern promoted   relaxation techniques promoted   therapeutic touch utilized  Goal: Absence of Device-Related Skin and Tissue Injury  Intervention: Maintain Skin and Tissue Health  Recent Flowsheet Documentation  Taken 04/02/2024 2000 by Yancy Lucie BRAVO, RN  Device Skin Pressure Protection:   absorbent pad utilized/changed adhesive use limited  Goal: Absence of Ventilator-Induced Lung Injury  Intervention: Prevent Ventilator-Associated Pneumonia  Recent Flowsheet Documentation  Taken 04/03/2024 0400 by Yancy Lucie BRAVO, RN  Head of Bed [facility]) Positioning: HOB at 30 degrees  Taken 04/02/2024 2130 by Yancy Lucie BRAVO, RN  Oral Care:   oral rinse provided   suction provided  Taken 04/02/2024 2000 by Yancy Lucie BRAVO, RN  Head of Bed [facility]) Positioning: HOB at 30 degrees

## 2024-04-03 NOTE — Consults (Signed)
 CVAD Liaison - Insertion Note      The CVAD Liaison was contacted for the insertion of Central Venous Access Device (CVAD).  A chart review performed.   Indication: Vasopressors and Poor venous access    Prior to the start of the procedure, a time out was performed and the identity of the patient was confirmed via name, medical record number and date of birth.  The Central Line Checklist was referenced.  The sterile field was prepared with necessary supplies and equipment verified.  Insertion site was prepped with chlorhexidine and allowed to dry.  Maximum sterile techniques was utilized.    CVAD was inserted by Prentice Staples MD.  Catheter was aspirated and flushed.  After line was placed and secured by provider, the insertion site cleansed and sterile dressing applied per manufacturer guidelines by CVAD Liaison.     CVAD Liaison was present during entire procedure.  Report of the procedure given to the Primary Nurse.      Thank you for this consult,  Therisa CHRISTELLA Evangelist, RN, CVAD Liaison     Consult Time 60 minutes

## 2024-04-03 NOTE — Procedures (Signed)
 Endotracheal Intubation Note     PRE-PROCEDURE    Date of Service: 04/03/2024    Patient Name:: [name]  Patient MRN: [id number]    Indications: acute hypoxic respiratory failure and acute hypercapneic respiratory failure    Significant Labs:  Potassium, Bld   Date Value Ref Range Status   04/03/2024 4.0 3.4 - 4.6 mmol/L Final   04/03/2024 3.9 3.4 - 4.6 mmol/L Final     Creatinine   Date Value Ref Range Status   04/02/2024 0.41 (L) 0.55 - 1.02 mg/dL Final       Consent:   Informed consent was NOT obtained. Procedure was performed emergently in setting of indications above.    PERI-PROCEDURE    Time-out was performed immediately prior to the procedure to verify correct patient, procedure, site, positioning, and special equipment if applicable.    Intubation Checklist utilized: yes    Intubation method: video laryngoscopy    Patient status: obtunded    Preoxygenation: NRB    Laryngoscope type/size: Glidescope Mac 3    Tube size: 7.5 mm     Tube type: cuffed ETT w/ subglottic suction    Laryngoscope View: Grade 2a: A partial view of the glottis, including only the posterior portion    Number of attempts: 1    Ventilation between attempts: NA    Cricoid pressure: no    Cords visualized: Yes.    Post-procedure assessment: bilateral chest rise and CO2 detector with color change     Breath sounds: bilateral lung fields and absent over the epigastrium     Cuff inflated: Yes.    ETT to lip: 22 cm    Tube secured with:  hollister    RSI medications: Etomidate and Rocuronium    POST-PROCEDURE    Condition:  The patient tolerated the procedure well, with SaO2 >95% and SBP >95 throughout. (Required pressor peri intubation)    Complications:   None    Plan:  - Follow up CXR      Maurilio LELON Leer, MD

## 2024-04-03 NOTE — Plan of Care (Signed)
 This shift, patient remains intubated. Norepinephrine initiated to maintain MAP >65. Right arterial line inserted by MDI. Vent settings adjusted based on serial blood gases. MRI brain ordered and performed. Propofol  initiated for sedation. Bedside bronchoscopy performed by MDI. Large bilious output noted from corpak, MDI made aware. Tube feeds on hold. OGT placed, KUB ordered. OGT placed to low intermittent suction for decompression and tube feeds and free water  flushes resumed to corpak.     Problem: Adult Inpatient Plan of Care  Goal: Absence of Hospital-Acquired Illness or Injury  Intervention: Identify and Manage Fall Risk  Recent Flowsheet Documentation  Taken 04/03/2024 0800 by Terrilyn Damien CROME, RN BSN  Safety Interventions:   aspiration precautions   bleeding precautions  Intervention: Prevent Skin Injury  Recent Flowsheet Documentation  Taken 04/03/2024 1400 by Terrilyn Damien CROME, RN BSN  Positioning for Skin: Left  Taken 04/03/2024 1200 by Terrilyn Damien CROME, RN BSN  Positioning for Skin: Right  Taken 04/03/2024 1000 by Terrilyn Damien CROME, RN BSN  Positioning for Skin: Left  Taken 04/03/2024 0800 by Terrilyn Damien CROME, RN BSN  Positioning for Skin: Right  Skin Protection:   cleansing with dimethicone incontinence wipes   adhesive use limited   incontinence pads utilized   transparent dressing maintained   zinc oxide barrier cream   tubing/devices free from skin contact   pulse oximeter probe site changed   silicone foam dressing in place  Intervention: Prevent and Manage VTE (Venous Thromboembolism) Risk  Recent Flowsheet Documentation  Taken 04/03/2024 1400 by Terrilyn Damien CROME, RN BSN  Anti-Embolism Device Status: (pharmaceutical prophylaxis) Other (Comment)  Taken 04/03/2024 1200 by Terrilyn Damien CROME, RN BSN  Anti-Embolism Device Status: (pharmaceutical prophylaxis) Other (Comment)  Taken 04/03/2024 1000 by Terrilyn Damien CROME, RN BSN  Anti-Embolism Device Status: (pharmaceutical prophylaxis) Other (Comment)  Taken 04/03/2024 0800 by Terrilyn Damien CROME, RN BSN  Anti-Embolism Device Status: (pharmaceutical prophylaxis) Other (Comment)  Intervention: Prevent Infection  Recent Flowsheet Documentation  Taken 04/03/2024 0800 by Terrilyn Damien CROME, RN BSN  Infection Prevention: cohorting utilized     Problem: Breathing Pattern Ineffective  Goal: Effective Breathing Pattern  Intervention: Promote Improved Breathing Pattern  Recent Flowsheet Documentation  Taken 04/03/2024 1400 by Terrilyn Damien CROME, RN BSN  Head of Bed [facility]) Positioning: HOB at 30-45 degrees  Taken 04/03/2024 1200 by Terrilyn Damien CROME, RN BSN  Head of Bed [facility] Positioning: HOB at 30-45 degrees  Taken 04/03/2024 0800 by Terrilyn Damien CROME, RN BSN  Head of Bed [facility]) Positioning: HOB at 30-45 degrees  Airway/Ventilation Management:   airway patency maintained   calming measures promoted   humidification applied   pulmonary hygiene promoted

## 2024-04-03 NOTE — Progress Notes (Signed)
 ICU TRANSPORT NOTE    Destination: MRI    Departing Unit: MICU  Pickup Time: 1115    Return Unit: MICU  Return Time: 1220    Jet patient ID band verified  Allergies Reviewed  Code Status at time of transport: Full    Report received from primary nurse via [employer]. Handoff performed . Patient transported via stretcher under ICU level of care. See vital signs during transport via [employer]. O2 via Ventilator @ 50 %. Patient is unable to follow commands. Patient tolerated procedure/scan well with PRNs. Universal / Special Airborne precautions maintained throughout transport.    Update and care given to primary nurse. See Doc Flowsheets/MAR for additional transportation documentation. Proper body mechanics and safe patient handling equipment were utilized throughout transport.

## 2024-04-03 NOTE — Procedures (Signed)
 Flexible Bronchoscopy w/ BAL Procedure Note     PRE-PROCEDURE    Date of Service: 04/03/2024    Patient Name:: [name]  Patient MRN: [id number]    Requesting Service: Medical ICU (MDI)    Indications: acute hypoxic respiratory failure    Known Bleeding Diathesis: Patient/caregiver denies any known bleeding or platelet disorder.     Antiplatelet Agents: This patient is not on an antiplatelet agent.    Systemic Anticoagulation: This patient is on full systemic anticoagulation.    Significant Labs:  INR   Date Value Ref Range Status   04/03/2024 1.61  Final     PT   Date Value Ref Range Status   04/03/2024 18.4 (H) 9.9 - 12.6 sec Final     APTT   Date Value Ref Range Status   04/03/2024 34.4 24.8 - 38.4 sec Final     Platelet   Date Value Ref Range Status   04/03/2024 111 (L) 150 - 450 10*9/L Final       Consent:   I explained the potential benefits and risks of the procedure, including aspiration of stomach contents, sore throat, dental injury, bronchospasm, pneumothorax, non diagnostic sampling, cardiac complications, bleeding, local infection, tissue damage, sepsis and death. I explained potential alternatives. The patient/HCDM understands these risks, agrees to the procedure, and signed the informed consent form.    Environment:  The patient was evaluated to determine the need to perform the bronchoscopy in a non-controlled environment. The risks associated with performing the procedure in an alternative environment were also evaluated and interventions to mitigate these risks were considered. Due to the patient's acuity and indications stated above, the bronchoscopy was performed at the bedside.    PERI-PROCEDURE    Time-out was performed immediately prior to the procedure to verify correct patient, procedure, site, positioning, and special equipment if applicable.    The head of the bed was placed at 30 degrees; the patient was hyperoxygenated on the ventilator and the bronchoscope was passed through the ETT and into the trachea.     Careful inspection of the tracheal lumen was accomplished and 2% lidocaine  was sprayed onto the carina. The scope was sequentially passed into the right main stem bronchi. The scope was advanced into the RUL, bronchus intermedius right middle lobe and right lower lobe.     The scope was then withdrawn to the level of the carina and advanced down the left main stem. The left upper lobe and lingula were inspected. The scope was then withdrawn to the level of the left main stem bronchi and advanced into the left lower lobe.     Each lung and accompanying airways were inspected and if necessary, cleared of secretions. The bronchoscope was removed    Sedation given:   - propofol  50 mcg for duration of procedure  - 5 mL of lidocaine  on carina    POST-PROCEDURE    Findings: Airways were friable There were scant thick secretions on left side consistent with mucus plugging seen on CXR . The above bronchoscopic findings are consistent with mucus plug. Some oozing on left side,  no frank bleeding.     Estimated Blood Loss: 0 ml    Condition:  The patient tolerated the procedure well and remains in the same condition as pre-procedure. The patient was tolerated the procedure well, with SaO2 >95% and SBP >100 throughout. No apparent complications were noted and the ETT was in appropriate position at the end of the case.  Complications: none    Plan:  - continue current plan of care  - No specimens sent.    Maurilio LELON Leer, MD

## 2024-04-03 NOTE — Progress Notes (Signed)
 MICU Daily Progress Note     Date of Service: 04/03/2024    Problem List:   Principal Problem:    Bacteremia, escherichia coli  Active Problems:    Alcoholic liver disease (HHS-HCC)    HTN (hypertension)    Depression with anxiety    Class 2 severe obesity with serious comorbidity in adult    Atrial fibrillation    (CMS-HCC)    Pleural effusion    Hypernatremia    Thrombocytopenia    Cirrhosis    (CMS-HCC)    A-fib (CMS-HCC)    Hepatic encephalopathy    (CMS-HCC)      Interval history: [name] is a 78 y.o. female with HFpEF, HTN, Afib on AC, MASLD cirrhosis, originally hospitalized for scheduled TEE/DCCV (aborted d/t LAA clot) c/b AHHRF, encephalopathy and septic shock requiring MICU care, was s/p extubation transferred to [facility] for further management of anticoagulation and hypernatremia. Transferred back to [facility] MICU for closer monitoring for respiratory status, pressor requirement, GI bleed, and management of carotid artery injury.     On 10/24, patient had acute worsening of respiratory status leading requiring intubation. When patient was given sedation for intubation, she coded and promptly had ROSC after a few minutes of CPR. Subsequently, CVC placed in neck, used to draw blood, unclear if given pressors through it, then found  to be in arterial system based on blood gas and imaging, likely right carotid. Line was subsequently removed, and pressure was held for 15 minutes, hemostasis with no hematoma. She was transferred to [facility] MICU for management of this carotid injury, to be evaluated by vascular surgery while also managing her increased vasopressor requirement and respiratory distress.  Has been weaned off pressors since 10/27 at 1 PM. VBG's indicating that she is ready for extubation, however her mentation suggests that she is not able to protect her airway.  She was extubated on 10/31, and mentation slowly improved.  Unfortunately on the morning of 11/2 she had increased lethargy, increased secretions, and she was not protecting her airway.  She was reintubated on 11/2, and required pressor support with norepinephrine and vasopressin after the induction agents.    24 hour events:  - Increased lethargy this morning, blood gas indicated respiratory acidosis and hypercarbia.  She was reintubated.  - Needs levo and vaso pressor support after induction agents for intubation with  - CXR revealed L sided white-out consistent mucus plugging  - Continued lactulose     Neurological   Hepatic encephalopathy  Per chart review at [facility], rapid response called on 10/24 for increased somnolence. At the time, differential included hepatic encephalopathy, toxic metabolic encephalopathy in the setting of shock (possibly sepsis), versus intracranial pathology. She was subsequently intubated. CT head on 10/24 showed no acute intracranial abnormality. Weaned off sedation with slow improvements in neurological status.  Plan to extubate when mental status improves.  - Continue lactulose , target 3-5 BMs daily  - treating Hypernatremia as below which may also be contributing  - check MRI brain given persistent encephalopathy despite lactulose     Sedation for Mechanical Ventilation  - PRN fentanyl  as needed, however, current mental status is poor    Analgesia: No pain issues  RASS at goal? N/A, not on sedation  [place] Agitation Assessment Scale (RASS) : -4 (04/03/2024  7:38 AM)    Pulmonary   Acute Hypoxic Hypercarbic Respiratory Failure (reintubated 11/2)  Rapid response called on 10/24 for increased somnolence, found to have acute hypercarbic respiratory failure and subsequently intubated.  Suspect hypercarbic respiratory failure due to encephalopathy as above. CXR on 10/24 showed worsening pleural effusions. Not currently getting diuresed. Given patient's hypoxia, and AMS, there was concern for infectious etiology. MRSA nares negative. Infectious workup thus far is unremarkable. SBTs show readiness extubation based on VBGs, however there is concern that she continues to be unable to protect her airway due to her current mentation   - Daily SAT/SBT screening  - plan for therapeutic bronchoscopy today for L mucus plugging and atelectasis      Cardiovascular   Carotid Artery Injury (resolved)  At [facility], CVC placed intended for RIJ on 10/24 following intubation, PEA, ROSC. Was used with known blood return. Unclear if it was used for pressor support, but highly likely that it was. CXR for placement check showed concern for intra aterial location, and blood gas confirmed it was arterial. Thought to be in carotid artery. CVC removed with pressure held for 15 minutes, with no hematoma formation. On arrival, no physical exam and bedside ultrasound showed no large hematoma. No active bleeding.   - Vascular Surgery consulted, stated fistula has healed based on Ultrasound findings    PEA arrest s/p ROSC  Hypovolemic/Hemorrhagic shock (resolved)  Atrial fibrillation  known left atrial appendage clot  HFpEF  Patient with PEA arrest peri-intubation with ROSC. Shock thought to be hemorrhagic in the setting of GI bleed History of atrial fibrillation with known LAA clot and HFpEF. Vasopressors were weaned off 10/27. APTT was elevated the morning of 10/28 necessitating changing anticoagulation from therapeutic heparin to therapeutic Lovenox .   - Holding Coreg   - Continue home Eliquis for anticoagulation for LAA clot    Renal   Hypernatremia (resolved)  At [facility], had persistently hypernatremia near 153. Had been treated with D5 gtt. Sodium throughout admission has largely been 146-148. Sodium was 151 morning of 10/28, presume this will decrease once we start tube feeds.   - Strict I/O's  - Sodium check q6hours   - FWF with TF, adjusting as needed    Infectious Disease/Autoimmune   E. Coli Bacteremia (resolved),   Initially admitted for afib, found to have E. coli bacteremia on October 10. This was treated with cefazolin . Rapid response called 10/24, patient noted to be hypothermic. WBCs jumped from 3.0 to 9.7 on 10/24. Although the most recent lab was following intubation and CPR. UA with pyuria, rare bacteria, hematuria. Peri-intubation, patient developed significant hypotension requiring initiation of NE and vasopressin. Suspect possible septic shock with unknown infectious source. Patient started on vancomycin and cefepime 10/24. CT Abdomen pelvis concerning for aspiration pneumonia. Following infectious workup was unremarkable. Antibiotics were stopped on 10/27.   - follow blood, urine cultures to final    Cultures:  Blood Culture, Routine (no units)   Date Value   03/25/2024 No Growth at 5 days   03/25/2024 No Growth at 5 days     Urine Culture, Comprehensive (no units)   Date Value   03/25/2024 <10,000 CFU/mL     Lower Respiratory Culture (no units)   Date Value   03/29/2024 Specimen Not Processed   03/27/2024 OROPHARYNGEAL FLORA ISOLATED     WBC (10*9/L)   Date Value   04/02/2024 7.0     WBC, UA (/HPF)   Date Value   03/25/2024 25 (H)       FEN/GI   Sigmoid Mass c/f Malignancy and Hematochezia (resolved)  Hemorrhagic Shock (resolved)  CT abdomen pelvis done 10/24 with evidence of cirrhosis and masslike thickening of the  lower sigmoid colon concerning for malignancy.  CTA abdomen pelvis performed 1024 with active extravasation into the gastric fundus possibly secondary to traumatic enteric tube placement.  GI scoped the patient on 10/25 and clipped an area of active bleeding.  They removed a total of 1.5 L of blood.  Hemoglobin 10/20 6 AM downtrended to 6.0, so 2 units of blood transfused.  GI stated this was consistent with 10/25 scope and likely did not represent increased bleeding. Hb steadily improving with Hb of 9.2 on 10/28.   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 Given shock of unclear etiology. CTAP demonstrating cirrhosis. Likely that cirrhosis is contributing to hypotension.   - Continue lactulose ; target 3-5 BMs daily  - NGT in place, on TF and FWF  - Daily MELD labs and CMP     MELD 3.0: 14 at 03/27/2024  5:19 AM  MELD-Na: 11 at 03/27/2024  5:19 AM  Calculated from:  Serum Creatinine: 0.99 mg/dL (Using min of 1 mg/dL) at 89/73/7974  4:80 AM  Serum Sodium: 146 mmol/L (Using max of 137 mmol/L) at 03/27/2024  5:19 AM  Total Bilirubin: 2.2 mg/dL at 89/73/7974  4:80 AM  Serum Albumin: 2.8 g/dL at 89/73/7974  4:80 AM  INR(ratio): 1.19 at 03/25/2024 10:27 PM  Age at listing (hypothetical): 78 years  Sex: Female at 03/27/2024  5:19 AM    Provider Malnutrition Assessment:  Body mass index is 39.22 kg/m??. BMI Interpretation: >/= 30 and < 40, consistent with obesity, clinically significant requiring additional resources and complicating multiple aspects of patient care.  GLIM criteria:   Pt does not meet criteria  -I have screened this patient for malnutrition and they did NOT meet criteria for malnutrition based on GLIM criteria.  -TF and FWF; Dietician consulted, appreciate assistance    Heme/Coag   Acute blood loss anemia  Hemoglobin down trended to 6.0 on the morning of 10/26 thought to be secondary to acute GI bleed that was scoped and clipped 10/25. Patient received 2 units PRBC 10/26 and an additional unit 10/27. Hb steadily improving with Hb of 9.2 on 10/28.   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 Reason: Not indicated.    Prophylaxis/LDA/Restraints/Consults   ICU Checklist completed: yes (see ICU rounding navigator in Epic)    Patient Lines/Drains/Airways Status       Active Active Lines, Drains, & Airways       Name Placement date Placement time Site Days NG/OG Tube Feedings 10 Fr. Right nostril 04/01/24  1138  Right nostril  1    Urethral Catheter Temperature probe 16 Fr. 04/03/24  0830  Temperature probe  less than 1    External Urinary Device 04/02/24 With Suction 04/02/24  0200  -- 1    Peripheral IV 03/20/24 Anterior;Left;Upper Arm 03/20/24  1730  Arm  13    Peripheral IV 04/03/24 Posterior;Right Hand 04/03/24  0714  Hand  less than 1                  Patient Lines/Drains/Airways Status  Active Wounds       Name Placement date Placement time Site Days    Wound 03/01/24 Other (Comment) Toe (Comment which one) Left;Other (Comment) Blister present on arrival, tx already in outpatient setting, in process of healing, surrounding erythema 03/01/24  0129  Toe (Comment which one)  33    Wound 03/13/24 Irritant Contact Dermatitis Incontinence Sacrum Mid gluteal cleft MASD? 03/13/24  --  Sacrum  21                  Goals of Care     Code Status:   Orders Placed This Encounter   Procedures    Full Code     Standing Status:   Standing     Number of Occurrences:   1        Designated Healthcare Decision Maker:  Ms. Grahn designated healthcare decision maker(s) is/are   HCDM (patient stated preference): Epping,[name] - Spouse - [phone]    HCDM, First AlternateNataley, Bahri - Daughter - [phone]. See HCDM section of Epic sidebar/storyboard or ACP tab in patient chart for details regarding active HCDMs and patient capacity for decision-making.      Subjective     Patient is resting comfortably. Able to answer yes/no questions and following commands.    Objective     Vitals - past 24 hours  Temp:  [36.4 ??C (97.5 ??F)-36.9 ??C (98.5 ??F)] 36.9 ??C (98.5 ??F)  Pulse:  [88-115] 94  SpO2 Pulse:  [83-121] 92  Resp:  [21-32] 23  BP: (62-177)/(46-113) 147/82  FiO2 (%):  [50 %-80 %] 80 %  SpO2:  [84 %-100 %] 100 % Intake/Output  I/O last 3 completed shifts:  In: 4213 [I.V.:10; NG/GT:4203]  Out: 3700 [Urine:1450; Emesis/NG output:2250]     Physical Exam:    General: intubated, opening eyes spontaneously  HEENT: normocephalic, atraumatic   CV: pulses palpable, regular borderline tachycardia   Pulm: Rhonchi w transmitted upper airway sounds throughout  GI: soft, NTND, + BS  MSK: 3+ pitting edema noted to her knees bilaterally, no clubbing or cyanosis  Skin: numerous large flat violaceous ecchymosis on left arm, chest, neck bilaterally.   Neuro: Withdrawals to painful stimuli. Responding to name and now reliably following commands.     Continuous Infusions:   Infusions Meds[1]    Scheduled Medications:   Scheduled Medications[2]    PRN medications:  PRN Medications[3]    Data/Imaging Review: Reviewed in Epic and personally interpreted on 04/03/2024. See EMR for detailed results.        Annison Birchard S Tericka Devincenzi, MD  PGY1 Internal Medicine, [facility]               [1]    NORepinephrine bitartrate-NS 2 mcg/min (04/03/24 0740)    vasopressin Stopped (04/03/24 0715)   [2]    apixaban  5 mg Enteral tube: gastric BID    cyanocobalamin (vitamin B-12)  1,000 mcg Enteral tube: gastric Daily    flu vac 2025 [id number](PF)  0.5 mL Intramuscular During hospitalization    folic acid   1 mg Enteral tube: gastric Daily    lactulose   40 g Enteral tube: gastric TID    multivitamins (ADULT)  1 tablet Enteral tube: gastric Daily    pantoprazole  40 mg Enteral tube: gastric BID    Propofol         thiamine  mononitrate (vit B1)  100 mg Enteral tube: gastric Daily   [3] acetaminophen , docusate sodium, fentaNYL  (PF) **OR** fentaNYL  (PF), Propofol 

## 2024-04-03 NOTE — Procedures (Signed)
 Arterial Line Insertion Procedure Note     Date of Service: 04/03/2024    Patient Name:: [name]  Patient MRN: [id number]    Indications: Respiratory failure    Consent:   Informed consent was NOT obtained. Procedure was performed emergently in setting of respiratory failure    Procedure Details:   Time-out was performed immediately prior to the procedure to verify correct patient, procedure, site, positioning, and special equipment if applicable.    Dasie???s test was performed to ensure adequate perfusion. The patient???s right wrist was prepped and draped in sterile fashion. 1% Lidocaine  was used to anesthetize the area. A 20 G Arrow line was introduced into the radial artery with ultrasound guidance. The catheter was threaded over the guide wire and the needle was removed with appropriate pulsatile blood return. The catheter was then sutured in place to the skin and a sterile CHG dressing applied. Perfusion to the extremity distal to the point of catheter insertion was checked and found to be adequate.  A pressure transducer was connected sterilely to the arterial line and an arterial line waveform was noted on the monitor.     Estimated Blood Loss: 3 ml    Condition:  The patient tolerated the procedure well and remains in the same condition as pre-procedure.    Complications:  The patient tolerated the procedure well and there were no complications.    Plan:  - use for BP monitoring  - blood gases    Maurilio LELON Leer, MD

## 2024-04-03 NOTE — Procedures (Signed)
PICC LINE TRIAGE NOTE    The Venous Access Team has received an order for PICC placement.  This patient has been triaged and  VAT will attempt bedside placement as schedule permits .    See PICC Specific History Flowsheet for additional details.    Thank You,    Norrin Shreffler, RN Venous Access Team [phone]      Workup Time:  30 minutes

## 2024-04-04 LAB — BLOOD GAS CRITICAL CARE PANEL, ARTERIAL
BASE EXCESS ARTERIAL: 7.2 — ABNORMAL HIGH (ref -2.0–2.0)
BASE EXCESS ARTERIAL: 5.2 — ABNORMAL HIGH (ref -2.0–2.0)
BASE EXCESS ARTERIAL: 6.8 — ABNORMAL HIGH (ref -2.0–2.0)
BASE EXCESS ARTERIAL: 5.9 — ABNORMAL HIGH (ref -2.0–2.0)
CALCIUM IONIZED ARTERIAL (MG/DL): 4.87 mg/dL (ref 4.40–5.40)
CALCIUM IONIZED ARTERIAL (MG/DL): 4.83 mg/dL (ref 4.40–5.40)
CALCIUM IONIZED ARTERIAL (MG/DL): 4.73 mg/dL (ref 4.40–5.40)
CALCIUM IONIZED ARTERIAL (MG/DL): 4.92 mg/dL (ref 4.40–5.40)
CARBOXYHEMOGLOBIN: 1.4 % — ABNORMAL HIGH (ref <1.2)
CARBOXYHEMOGLOBIN: 2.5 % — ABNORMAL HIGH (ref <1.2)
CARBOXYHEMOGLOBIN: 1.8 % — ABNORMAL HIGH (ref <1.2)
CARBOXYHEMOGLOBIN: 1.8 % — ABNORMAL HIGH (ref <1.2)
CHLORIDE, WHOLE BLOOD: 105 mmol/L (ref 98–107)
CHLORIDE, WHOLE BLOOD: 104 mmol/L (ref 98–107)
GLUCOSE WHOLE BLOOD: 125 mg/dL (ref 70–179)
GLUCOSE WHOLE BLOOD: 152 mg/dL (ref 70–179)
GLUCOSE WHOLE BLOOD: 171 mg/dL (ref 70–179)
GLUCOSE WHOLE BLOOD: 168 mg/dL (ref 70–179)
HCO3 ARTERIAL: 31 mmol/L — ABNORMAL HIGH (ref 22–27)
HCO3 ARTERIAL: 29 mmol/L — ABNORMAL HIGH (ref 22–27)
HCO3 ARTERIAL: 30 mmol/L — ABNORMAL HIGH (ref 22–27)
HCO3 ARTERIAL: 30 mmol/L — ABNORMAL HIGH (ref 22–27)
HEMOGLOBIN BLOOD GAS: 8.4 g/dL — ABNORMAL LOW (ref 12.00–16.00)
HEMOGLOBIN BLOOD GAS: 8.4 g/dL — ABNORMAL LOW (ref 12.00–16.00)
HEMOGLOBIN BLOOD GAS: 8.6 g/dL — ABNORMAL LOW (ref 12.00–16.00)
HEMOGLOBIN BLOOD GAS: 8.7 g/dL — ABNORMAL LOW (ref 12.00–16.00)
LACTATE BLOOD ARTERIAL: 1.3 mmol/L — ABNORMAL HIGH (ref <1.3)
LACTATE BLOOD ARTERIAL: 1.5 mmol/L — ABNORMAL HIGH (ref <1.3)
LACTATE BLOOD ARTERIAL: 1.7 mmol/L — ABNORMAL HIGH (ref <1.3)
LACTATE BLOOD ARTERIAL: 1.9 mmol/L — ABNORMAL HIGH (ref <1.3)
METHEMOGLOBIN: <1 % (ref <1.5)
METHEMOGLOBIN: <1 % (ref <1.5)
METHEMOGLOBIN: <1 % (ref <1.5)
O2 SATURATION ARTERIAL: 99.5 % (ref 94.0–100.0)
O2 SATURATION ARTERIAL: 98.2 % (ref 94.0–100.0)
O2 SATURATION ARTERIAL: 99.1 % (ref 94.0–100.0)
O2 SATURATION ARTERIAL: 98.5 % (ref 94.0–100.0)
OXYHEMOGLOBIN: 97.8 % (ref 94.0–100.0)
OXYHEMOGLOBIN: 95.5 % (ref 94.0–100.0)
OXYHEMOGLOBIN: 97.1 % (ref 94.0–100.0)
OXYHEMOGLOBIN: 96.6 % (ref 94.0–100.0)
PCO2 ARTERIAL: 40.4 mmHg (ref 35.0–45.0)
PCO2 ARTERIAL: 37.7 mmHg (ref 35.0–45.0)
PCO2 ARTERIAL: 37.8 mmHg (ref 35.0–45.0)
PCO2 ARTERIAL: 43.2 mmHg (ref 35.0–45.0)
PH ARTERIAL: 7.5 — ABNORMAL HIGH (ref 7.35–7.45)
PH ARTERIAL: 7.49 — ABNORMAL HIGH (ref 7.35–7.45)
PH ARTERIAL: 7.51 — ABNORMAL HIGH (ref 7.35–7.45)
PH ARTERIAL: 7.46 — ABNORMAL HIGH (ref 7.35–7.45)
PO2 ARTERIAL: 112 mmHg — ABNORMAL HIGH (ref 80.0–110.0)
PO2 ARTERIAL: 84.5 mmHg (ref 80.0–110.0)
PO2 ARTERIAL: 100 mmHg (ref 80.0–110.0)
PO2 ARTERIAL: 90.9 mmHg (ref 80.0–110.0)
POTASSIUM WHOLE BLOOD: 3.2 mmol/L — ABNORMAL LOW (ref 3.4–4.6)
POTASSIUM WHOLE BLOOD: 3.7 mmol/L (ref 3.4–4.6)
POTASSIUM WHOLE BLOOD: 3.5 mmol/L (ref 3.4–4.6)
POTASSIUM WHOLE BLOOD: 3.7 mmol/L (ref 3.4–4.6)
SODIUM WHOLE BLOOD: 138 mmol/L (ref 135–145)
SODIUM WHOLE BLOOD: 140 mmol/L (ref 135–145)
SODIUM WHOLE BLOOD: 138 mmol/L (ref 135–145)
SODIUM WHOLE BLOOD: 140 mmol/L (ref 135–145)

## 2024-04-04 LAB — CBC W/ AUTO DIFF
BASOPHILS ABSOLUTE COUNT: 0 10*9/L (ref 0.0–0.1)
BASOPHILS RELATIVE PERCENT: 0.3 %
EOSINOPHILS ABSOLUTE COUNT: 0.2 10*9/L (ref 0.0–0.5)
EOSINOPHILS RELATIVE PERCENT: 2.7 %
HEMATOCRIT: 25.4 % — ABNORMAL LOW (ref 34.0–44.0)
HEMOGLOBIN: 8.2 g/dL — ABNORMAL LOW (ref 11.3–14.9)
LYMPHOCYTES ABSOLUTE COUNT: 0.9 10*9/L — ABNORMAL LOW (ref 1.1–3.6)
LYMPHOCYTES RELATIVE PERCENT: 15.3 %
MEAN CORPUSCULAR HEMOGLOBIN CONC: 32.5 g/dL (ref 32.0–36.0)
MEAN CORPUSCULAR HEMOGLOBIN: 31.7 pg (ref 25.9–32.4)
MEAN CORPUSCULAR VOLUME: 97.6 fL — ABNORMAL HIGH (ref 77.6–95.7)
MEAN PLATELET VOLUME: 8.6 fL (ref 6.8–10.7)
MONOCYTES ABSOLUTE COUNT: 1.1 10*9/L — ABNORMAL HIGH (ref 0.3–0.8)
MONOCYTES RELATIVE PERCENT: 17.7 %
NEUTROPHILS ABSOLUTE COUNT: 3.8 10*9/L (ref 1.8–7.8)
NEUTROPHILS RELATIVE PERCENT: 64 %
PLATELET COUNT: 135 10*9/L — ABNORMAL LOW (ref 150–450)
RED BLOOD CELL COUNT: 2.6 10*12/L — ABNORMAL LOW (ref 3.95–5.13)
RED CELL DISTRIBUTION WIDTH: 23.6 % — ABNORMAL HIGH (ref 12.2–15.2)
WBC ADJUSTED: 6 10*9/L (ref 3.6–11.2)

## 2024-04-04 LAB — CYSTATIN C
CYSTATIN C: 1.43 mg/L — ABNORMAL HIGH (ref 0.64–1.23)
EGFR CKD-EPI (2012) CYSTATIN C FEMALE: 42 mL/min/1.73m2 — ABNORMAL LOW (ref >=60)

## 2024-04-04 LAB — BASIC METABOLIC PANEL
ANION GAP: 11 mmol/L (ref 5–14)
ANION GAP: 8 mmol/L (ref 5–14)
BLOOD UREA NITROGEN: 17 mg/dL (ref 9–23)
BLOOD UREA NITROGEN: 17 mg/dL (ref 9–23)
BUN / CREAT RATIO: 37
BUN / CREAT RATIO: 35
CALCIUM: 8.6 mg/dL — ABNORMAL LOW (ref 8.7–10.4)
CALCIUM: 8.6 mg/dL — ABNORMAL LOW (ref 8.7–10.4)
CHLORIDE: 100 mmol/L (ref 98–107)
CHLORIDE: 103 mmol/L (ref 98–107)
CO2: 30 mmol/L (ref 20.0–31.0)
CO2: 30 mmol/L (ref 20.0–31.0)
CREATININE: 0.46 mg/dL — ABNORMAL LOW (ref 0.55–1.02)
CREATININE: 0.48 mg/dL — ABNORMAL LOW (ref 0.55–1.02)
EGFR CKD-EPI (2021) FEMALE: >90 mL/min/1.73m2 (ref >=60)
EGFR CKD-EPI (2021) FEMALE: >90 mL/min/1.73m2 (ref >=60)
GLUCOSE RANDOM: 150 mg/dL (ref 70–179)
GLUCOSE RANDOM: 178 mg/dL (ref 70–179)
POTASSIUM: 3.3 mmol/L — ABNORMAL LOW (ref 3.4–4.8)
POTASSIUM: 3.6 mmol/L (ref 3.4–4.8)
SODIUM: 141 mmol/L (ref 135–145)
SODIUM: 141 mmol/L (ref 135–145)

## 2024-04-04 LAB — HEPATIC FUNCTION PANEL
ALBUMIN: 2.7 g/dL — ABNORMAL LOW (ref 3.4–5.0)
ALKALINE PHOSPHATASE: 91 U/L (ref 46–116)
ALT (SGPT): 19 U/L (ref 10–49)
AST (SGOT): 32 U/L (ref <=34)
BILIRUBIN DIRECT: 0.6 mg/dL — ABNORMAL HIGH (ref 0.00–0.30)
BILIRUBIN TOTAL: 1.2 mg/dL (ref 0.3–1.2)
PROTEIN TOTAL: 5 g/dL — ABNORMAL LOW (ref 5.7–8.2)

## 2024-04-04 LAB — PHOSPHORUS
PHOSPHORUS: 2.5 mg/dL (ref 2.4–5.1)
PHOSPHORUS: 2.2 mg/dL — ABNORMAL LOW (ref 2.4–5.1)

## 2024-04-04 LAB — HEMOGLOBIN AND HEMATOCRIT, BLOOD
HEMATOCRIT: 24.3 % — ABNORMAL LOW (ref 34.0–44.0)
HEMOGLOBIN: 8.3 g/dL — ABNORMAL LOW (ref 11.3–14.9)

## 2024-04-04 LAB — MAGNESIUM
MAGNESIUM: 1.9 mg/dL (ref 1.6–2.6)
MAGNESIUM: 1.8 mg/dL (ref 1.6–2.6)

## 2024-04-04 MED ADMIN — folic acid (FOLVITE) tablet 1 mg: 1 mg | GASTROENTERAL | @ 14:00:00

## 2024-04-04 MED ADMIN — thiamine mononitrate (vit B1) tablet 100 mg: 100 mg | GASTROENTERAL | @ 14:00:00

## 2024-04-04 MED ADMIN — cyanocobalamin (vitamin B-12) tablet 1,000 mcg: 1000 ug | GASTROENTERAL | @ 14:00:00

## 2024-04-04 MED ADMIN — pantoprazole (Protonix) oral suspension: 40 mg | GASTROENTERAL | @ 03:00:00

## 2024-04-04 MED ADMIN — multivitamins, therapeutic with minerals tablet 1 tablet: 1 | GASTROENTERAL | @ 14:00:00

## 2024-04-04 MED ADMIN — apixaban (ELIQUIS) tablet 5 mg: 5 mg | GASTROENTERAL | @ 03:00:00

## 2024-04-04 MED ADMIN — apixaban (ELIQUIS) tablet 5 mg: 5 mg | GASTROENTERAL | @ 14:00:00

## 2024-04-04 MED ADMIN — magnesium sulfate 2gm/50mL IVPB: 2 g | INTRAVENOUS | @ 23:00:00 | Stop: 2024-04-04

## 2024-04-04 MED ADMIN — lactulose oral solution: 40 g | GASTROENTERAL | @ 11:00:00

## 2024-04-04 MED ADMIN — lactulose oral solution: 40 g | GASTROENTERAL | @ 03:00:00

## 2024-04-04 MED ADMIN — lactulose oral solution: 40 g | GASTROENTERAL | @ 18:00:00

## 2024-04-04 MED ADMIN — lactulose oral solution: 40 g | GASTROENTERAL | @ 23:00:00

## 2024-04-04 MED ADMIN — potassium chloride (KLOR-CON) packet 40 mEq: 40 meq | GASTROENTERAL | @ 12:00:00 | Stop: 2024-04-04

## 2024-04-04 MED ADMIN — magnesium oxide (MAG-OX) tablet 400 mg: 400 mg | ORAL | @ 14:00:00 | Stop: 2024-04-04

## 2024-04-04 MED ADMIN — fentaNYL (PF) (SUBLIMAZE) injection 25 mcg: 25 ug | INTRAVENOUS | @ 23:00:00 | Stop: 2024-04-11

## 2024-04-04 MED ADMIN — NORepinephrine 8 mg in dextrose 5 % 250 mL (32 mcg/mL) infusion PMB: 0-30 ug/min | INTRAVENOUS | @ 06:00:00

## 2024-04-04 MED ADMIN — potassium chloride 20 mEq in 100 mL IVPB Premix: 20 meq | INTRAVENOUS | @ 23:00:00 | Stop: 2024-04-04

## 2024-04-04 MED ADMIN — pantoprazole (Protonix) injection 40 mg: 40 mg | INTRAVENOUS | @ 14:00:00 | Stop: 2024-04-04

## 2024-04-04 MED ADMIN — pantoprazole (Protonix) injection 40 mg: 40 mg | INTRAVENOUS | @ 07:00:00 | Stop: 2024-04-04

## 2024-04-04 MED ADMIN — sodium phosphate 30 mmol in dextrose 5 % 250 mL IVPB: 30 mmol | INTRAVENOUS | @ 23:00:00 | Stop: 2024-04-04

## 2024-04-04 MED ADMIN — potassium chloride 20 mEq in 100 mL IVPB Premix: 20 meq | INTRAVENOUS | Stop: 2024-04-04

## 2024-04-04 NOTE — Progress Notes (Signed)
 MICU Daily Progress Note     Date of Service: 04/04/2024    Problem List:   Principal Problem:    Bacteremia, escherichia coli  Active Problems:    Alcoholic liver disease (HHS-HCC)    HTN (hypertension)    Depression with anxiety    Class 2 severe obesity with serious comorbidity in adult    Atrial fibrillation    (CMS-HCC)    Pleural effusion    Hypernatremia    Thrombocytopenia    Cirrhosis    (CMS-HCC)    A-fib (CMS-HCC)    Hepatic encephalopathy    (CMS-HCC)      Interval history: [name] is a 78 y.o. female with HFpEF, HTN, Afib on AC, MASLD cirrhosis, originally hospitalized for scheduled TEE/DCCV (aborted d/t LAA clot) c/b AHHRF, encephalopathy and septic shock requiring MICU care, was s/p extubation transferred to [facility] for further management of anticoagulation and hypernatremia. Transferred back to [facility] MICU for closer monitoring for respiratory status, pressor requirement, GI bleed, and management of carotid artery injury.     On 10/24, patient had acute worsening of respiratory status leading requiring intubation. When patient was given sedation for intubation, she coded and promptly had ROSC after a few minutes of CPR. Subsequently, CVC placed in neck, used to draw blood, unclear if given pressors through it, then found  to be in arterial system based on blood gas and imaging, likely right carotid. Line was subsequently removed, and pressure was held for 15 minutes, hemostasis with no hematoma. She was transferred to [facility] MICU for management of this carotid injury, to be evaluated by vascular surgery while also managing her increased vasopressor requirement and respiratory distress.  Has been weaned off pressors since 10/27 at 1 PM. VBG's indicating that she is ready for extubation, however her mentation suggests that she is not able to protect her airway.  She was extubated on 10/31, and mentation slowly improved.  Unfortunately on the morning of 11/2 she had increased lethargy, increased secretions, and she was not protecting her airway.  She was reintubated on 11/2, and required pressor support with norepinephrine and vasopressin after the induction agents.     24 hour events:  - Continue lactulose   - Repeat infectious workup given failed SBT  - Added daily cystatin C  - Given repeat intubations, reached out to daughter to arrange goals of care meeting on Wednesday 11/5.  Plan to discuss overall trajectory and what to do in case she fails extubation once again.    Neurological   Hepatic encephalopathy  Per chart review at [facility], rapid response called on 10/24 for increased somnolence. At the time, differential included hepatic encephalopathy, toxic metabolic encephalopathy in the setting of shock (possibly sepsis), versus intracranial pathology. She was subsequently intubated. CT head on 10/24 showed no acute intracranial abnormality. Weaned off sedation with slow improvements in neurological status.  Plan to extubate when mental status improves.  - Continue lactulose , target 3-5 BMs daily.  MRI brain was impeded due to movement, however with a saw was unremarkable.  - treating Hypernatremia as below which may also be contributing    Sedation for Mechanical Ventilation  - PRN fentanyl  as needed, however, current mental status is poor    Analgesia: No pain issues  RASS at goal? N/A, not on sedation  [place] Agitation Assessment Scale (RASS) : -1 (04/04/2024  2:00 PM)    Pulmonary   Acute Hypoxic Hypercarbic Respiratory Failure (reintubated 11/2)  Rapid response called on 10/24  for increased somnolence, found to have acute hypercarbic respiratory failure and subsequently intubated. Suspect hypercarbic respiratory failure due to encephalopathy as above. CXR on 10/24 showed worsening pleural effusions. Not currently getting diuresed. Given patient's hypoxia, and AMS, there was concern for infectious etiology. MRSA nares negative. Infectious workup thus far is unremarkable. SBTs show readiness extubation based on VBGs, however there is concern that she continues to be unable to protect her airway due to her current mentation.  Reintubated on 11/2 followed by therapeutic bronchoscopy.  - Daily SAT/SBT screening      Cardiovascular   Carotid Artery Injury (resolved)  At [facility], CVC placed intended for RIJ on 10/24 following intubation, PEA, ROSC. Was used with known blood return. Unclear if it was used for pressor support, but highly likely that it was. CXR for placement check showed concern for intra aterial location, and blood gas confirmed it was arterial. Thought to be in carotid artery. CVC removed with pressure held for 15 minutes, with no hematoma formation. On arrival, no physical exam and bedside ultrasound showed no large hematoma. No active bleeding.   - Vascular Surgery consulted, stated fistula has healed based on Ultrasound findings    PEA arrest s/p ROSC  Hypovolemic/Hemorrhagic shock (resolved)  Atrial fibrillation  known left atrial appendage clot  HFpEF  Patient with PEA arrest peri-intubation with ROSC. Shock thought to be hemorrhagic in the setting of GI bleed History of atrial fibrillation with known LAA clot and HFpEF. Vasopressors were weaned off 10/27. APTT was elevated the morning of 10/28 necessitating changing anticoagulation from therapeutic heparin to therapeutic Lovenox .   - Holding Coreg   - Continue home Eliquis for anticoagulation for LAA clot    Renal   Hypernatremia (resolved)  At [facility], had persistently hypernatremia near 153. Had been treated with D5 gtt. Sodium throughout admission has largely been 146-148. Sodium was 151 morning of 10/28, presume this will decrease once we start tube feeds.  Sodium has overall normalized and continues to be within normal limits.  - Strict I/O's  - Sodium check q6hours   - FWF with TF, adjusting as needed    Infectious Disease/Autoimmune   E. Coli Bacteremia (resolved),   Initially admitted for afib, found to have E. coli bacteremia on October 10. This was treated with cefazolin . Rapid response called 10/24, patient noted to be hypothermic. WBCs jumped from 3.0 to 9.7 on 10/24. Although the most recent lab was following intubation and CPR. UA with pyuria, rare bacteria, hematuria. Peri-intubation, patient developed significant hypotension requiring initiation of NE and vasopressin. Suspect possible septic shock with unknown infectious source. Patient started on vancomycin and cefepime 10/24. CT Abdomen pelvis concerning for aspiration pneumonia. Following infectious workup was unremarkable. Antibiotics were stopped on 10/27.  Infectious workup was repeated on 11/3 due to reintubation and status post bronchoscopy.  - follow blood, urine cultures to final    Cultures:  Blood Culture, Routine (no units)   Date Value   03/25/2024 No Growth at 5 days   03/25/2024 No Growth at 5 days     Urine Culture, Comprehensive (no units)   Date Value   03/25/2024 <10,000 CFU/mL     Lower Respiratory Culture (no units)   Date Value   03/29/2024 Specimen Not Processed   03/27/2024 OROPHARYNGEAL FLORA ISOLATED     WBC (10*9/L)   Date Value   04/04/2024 6.0     WBC, UA (/HPF)   Date Value   03/25/2024 25 (H)  FEN/GI   Sigmoid Mass c/f Malignancy and Hematochezia (resolved)  Hemorrhagic Shock (resolved)  CT abdomen pelvis done 10/24 with evidence of cirrhosis and masslike thickening of the lower sigmoid colon concerning for malignancy.  CTA abdomen pelvis performed 1024 with active extravasation into the gastric fundus possibly secondary to traumatic enteric tube placement.  GI scoped the patient on 10/25 and clipped an area of active bleeding.  They removed a total of 1.5 L of blood.  Hemoglobin 10/20 6 AM downtrended to 6.0, so 2 units of blood transfused.  GI stated this was consistent with 10/25 scope and likely did not represent increased bleeding. Hb steadily improving with Hb of 9.2 on 10/28.   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 Given shock of unclear etiology. CTAP demonstrating cirrhosis. Likely that cirrhosis is contributing to hypotension.   - Continue lactulose ; target 3-5 BMs daily  - NGT in place, on TF and FWF  - Daily MELD labs and CMP     MELD 3.0: 14 at 04/04/2024  4:38 AM  MELD-Na: 12 at 04/04/2024  4:38 AM  Calculated from:  Serum Creatinine: 0.46 mg/dL (Using min of 1 mg/dL) at 88/10/7972  5:61 AM  Serum Sodium: 141 mmol/L (Using max of 137 mmol/L) at 04/04/2024  4:38 AM  Total Bilirubin: 1.2 mg/dL at 88/10/7972  5:61 AM  Serum Albumin: 2.7 g/dL at 88/10/7972  5:61 AM  INR(ratio): 1.61 at 04/03/2024 10:26 AM  Age at listing (hypothetical): 78 years  Sex: Female at 04/04/2024  4:38 AM    Provider Malnutrition Assessment:  Body mass index is 39.22 kg/m??. BMI Interpretation: >/= 30 and < 40, consistent with obesity, clinically significant requiring additional resources and complicating multiple aspects of patient care.  GLIM criteria:   Pt does not meet criteria  -I have screened this patient for malnutrition and they did NOT meet criteria for malnutrition based on GLIM criteria.  -TF and FWF; Dietician consulted, appreciate assistance    Heme/Coag   Acute blood loss anemia  Hemoglobin down trended to 6.0 on the morning of 10/26 thought to be secondary to acute GI bleed that was scoped and clipped 10/25. Patient received 2 units PRBC 10/26 and an additional unit 10/27. Hb steadily improving with Hb of 9.2 on 10/28.   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 Reason: Not indicated.    Prophylaxis/LDA/Restraints/Consults   ICU Checklist completed: yes (see ICU rounding navigator in Epic)    Patient Lines/Drains/Airways Status       Active Active Lines, Drains, & Airways       Name Placement date Placement time Site Days    ETT  7.5 04/03/24  0702  -- 1    CVC Triple Lumen 04/03/24 Left Internal jugular 04/03/24  2037  Internal jugular  less than 1    NG/OG Tube Feedings 10 Fr. Right nostril 04/01/24  1138  Right nostril  3    NG/OG Tube Decompression Right mouth 04/03/24  1500  Right mouth  1    Urethral Catheter Temperature probe 16  Fr. 04/03/24  0830  Temperature probe  1    Peripheral IV 03/20/24 Anterior;Left;Upper Arm 03/20/24  1730  Arm  14    Peripheral IV 04/03/24 Posterior;Right Hand 04/03/24  0714  Hand  1    Arterial Line 04/03/24 Right Radial 04/03/24  0830  Radial  1                  Patient Lines/Drains/Airways Status       Active Wounds       Name Placement date Placement time Site Days    Wound 03/01/24 Other (Comment) Toe (Comment which one) Left;Other (Comment) Blister present on arrival, tx already in outpatient setting, in process of healing, surrounding erythema 03/01/24  0129  Toe (Comment which one)  34    Wound 03/13/24 Irritant Contact Dermatitis Incontinence Sacrum Mid gluteal cleft MASD? 03/13/24  --  Sacrum  22                  Goals of Care     Code Status:   Orders Placed This Encounter   Procedures    Full Code     Standing Status:   Standing     Number of Occurrences:   1        Designated Healthcare Decision Maker:  Ms. Galeno designated healthcare decision maker(s) is/are   HCDM (patient stated preference): Enterline,[name] - Spouse - [phone]    HCDM, First AlternateZynia, Wojtowicz - Daughter - [phone]. See HCDM section of Epic sidebar/storyboard or ACP tab in patient chart for details regarding active HCDMs and patient capacity for decision-making.      Subjective     Patient is resting comfortably. Able to answer yes/no questions and following commands.    Objective     Vitals - past 24 hours  Pulse:  [72-128] 120  SpO2 Pulse:  [71-128] 123  Resp:  [15-30] 24  FiO2 (%):  [30 %-50 %] 30 %  SpO2:  [97 %-100 %] 98 % Intake/Output  I/O last 3 completed shifts:  In: 6096.9 [I.V.:218.9; NG/GT:5393; IV Piggyback:485]  Out: 4170 [Urine:1820; Emesis/NG output:1650; Stool:700]     Physical Exam:    General: intubated, opening eyes spontaneously  HEENT: normocephalic, atraumatic   CV: pulses palpable, regular borderline tachycardia   Pulm: Rhonchi w transmitted upper airway sounds throughout  GI: soft, NTND, + BS  MSK: 3+ pitting edema noted to her knees bilaterally, no clubbing or cyanosis  Skin: numerous large flat violaceous ecchymosis on left arm, chest, neck bilaterally.   Neuro: Withdrawals to painful stimuli. Responding to name and now reliably following commands.     Continuous Infusions:   Infusions Meds[1]    Scheduled Medications:   Scheduled Medications[2]    PRN medications:  PRN Medications[3]    Data/Imaging Review: Reviewed in Epic and personally interpreted on 04/04/2024. See EMR for detailed results.        Katherin Ramey S Joslyn Ramos, MD  PGY1 Internal Medicine, [facility]                 [1]    NORepinephrine bitartrate-NS 2 mcg/min (04/04/24 0957)    vasopressin Stopped (04/03/24 0715)   [2]    apixaban  5 mg Enteral tube: gastric BID    cyanocobalamin (vitamin B-12)  1,000 mcg Enteral tube: gastric Daily    flu vac 2025 [id number](PF)  0.5 mL Intramuscular During hospitalization    folic acid   1 mg Enteral tube: gastric Daily  lactulose   40 g Enteral tube: gastric QID    multivitamins (ADULT)  1 tablet Enteral tube: gastric Daily    [START ON 04/05/2024] pantoprazole (Protonix) intravenous solution  40 mg Intravenous Daily    thiamine  mononitrate (vit B1)  100 mg Enteral tube: gastric Daily   [3] acetaminophen , docusate sodium, fentaNYL  (PF) **OR** fentaNYL  (PF)

## 2024-04-04 NOTE — Plan of Care (Signed)
 Shift Summary  Ventilator mode was transitioned from VC/AC to PSV-CPAP, and airway suction was performed early in the shift.   Norepinephrine infusion rate was adjusted twice during the morning hours and subsequently turned off.   Pain assessment at the end of the shift showed increased pain behaviors and a higher CPOT score, prompting PRN fentanyl  administration.   Electrolyte replacement was provided in the evening, including magnesium , potassium, and sodium phosphate.   Breathing pattern remained regular and unlabored despite fluctuations in respiratory rate and ventilator changes.    Effective Breathing Pattern: Respiratory rate fluctuated throughout the shift with periods of elevation, but respiratory pattern remained regular and unlabored; ventilator mode was changed from VC/AC to PSV-CPAP, and airway suction was performed early in the shift.

## 2024-04-04 NOTE — Progress Notes (Signed)
 Patient continued on VC/AC this shift and was suctioned for a small amount of thick secretions.  Respiratory rate decreased from 20 to 15 due to worsening alkalosis.  She was trialed on SBT this morning which she failed due to low tidal volumes and RSBI >120.

## 2024-04-04 NOTE — Progress Notes (Signed)
 Adult Nutrition Progress Note    Visit Type: Follow-Up  Reason for Visit: Enteral Nutrition    NUTRITION INTERVENTIONS and RECOMMENDATION     Tube feeds:   Continue Vital High Protein at goal rate 70 mL/hr. This provides 1470 kcals, 129 g protein, 164 g carbohydrate, 35 g fat, 0 g fiber, 1229 mL free water , and meets 98% USRDI.  Decrease to 100 mL Q4h     Carries severe malnutrition dx - needing ongoing K/phos repletion   Weekly weights  Continue multivitamin, thiamine  100 mg     NUTRITION ASSESSMENT     Patient appropriate for enteral nutrition support given mechanical ventilation.   Lactulose  being titrated for goal of 3-5 BMs per day - FMS in place  Decreasing FWF given hypernatremia resolving     NUTRITIONALLY RELEVANT DATA     HPI & PMH:   [name] is a 78 y.o. female who is presenting to [facility] with Bacteremia, escherichia coli, in the setting of the following pertinent/contributing co-morbidities:  HFpEF, HTN, Cirrhosis, Breast Cancer s/p Partial Mastectomy/Radiation.     Nutrition Progress:   Extubated 10/31, re-intubated 11/2 - 3rd intubation. Plans for GOC discussion with family. Ongoing needs for electrolyte repletion. FMS in place with appropriate outputs (lactulose  40 QID)  Feeds held ~6 hours 10/31 for extubation and ~5 hours 11/2 for intubation.     Medications:  Nutritionally pertinent medications reviewed and evaluated for potential food and/or medication interactions.   B12, folvite , lactulose  40 g QID, multivitamin, protonix, klor-con , 30 mmol IV na phos, thiamine  100 mg    Labs:   Nutritionally pertinent labs reviewed.   Na WNL, K (3.3)      Nutritional Needs:   Daily Estimated Nutrient Needs:  Energy: [phone] kcals 11-14 kcal/kg (per ASPEN/SCCM guidelines for the critically ill obese, BMI 30-50) using admission body weight, 87 kg (03/29/24 1356)]  Protein: > 109 gm [> 2 gm/kg (per ASPEN/SCCM guidelines for the critically ill obese, BMI 30-39.9) using ideal body weight, 55 kg (03/29/24 1356)]  Carbohydrate:   [45-60% of kcal]  Fluid:   mL [per MD team]    Compared to Adjusted PSU Equation for Ventillated Patients (BMI >/=30, Age >/= 60)  Tmax: 38, Ve: 6.93  Energy: 1577 kcals/day    Anthropometric Data:  Height: 162.6 cm (5' 4.02)   Admission weight: 87 kg (191 lb 12.8 oz)  Last recorded weight: (!) 103.7 kg (228 lb 9.9 oz) (04/02/24)  IBW: 54.53 kg  BMI: Body mass index is 39.22 kg/m??.   Usual Body Weight: Unable to obtain at this time   Weight Assessment: per below - trending down pta, though pt with cirrhosis & currently unknown dry wt. Increase over admit likely influenced by fluid - pt noted with 2+ pitting edema bilateral knees and I/Os show pt +11 L over admit.    Wt Readings from Last 10 Encounters:   04/02/24 (!) 103.7 kg (228 lb 9.9 oz)   03/07/24 89.3 kg (196 lb 12.8 oz)   03/03/24 89.1 kg (196 lb 6.4 oz)   04/23/23 96.6 kg (213 lb)   03/05/23 100.2 kg (220 lb 14.4 oz)   11/27/22 (!) 102.5 kg (225 lb 14.4 oz)   10/20/22 (!) 108 kg (238 lb)   07/17/22 (!) 108 kg (238 lb)   06/09/22 (!) 103.2 kg (227 lb 8 oz)   05/08/22 (!) 105.8 kg (233 lb 3.2 oz)     Malnutrition Assessment:  Malnutrition Assessment using AND/ASPEN or  GLIM Clinical Characteristics:  Patient does not meet AND/ASPEN criteria for malnutrition at this time (03/16/24 1326)       Nutrition Focused Physical Exam:      Nutrition Evaluation  Overall Impressions: Nutrition-Focused Physical Exam not indicated due to lack of malnutrition risk factors. (03/16/24 1326)     Care plan:  Patient does not meet malnutrition criteria     Current Nutrition:  NPO with OG tube in place   Nutrition Orders            Vital High Protein (Critical Care Low Cal/HP) continuous tube feed starting at 10/30 1500          Nutritionally Pertinent Allergies, Intolerances, Sensitivities, and/or Cultural/Religious Restrictions:  none identified at this time     GOALS and EVALUATION     Patient to meet 80% or greater of nutritional needs via enteral nutrition while remains on tube feeds. - Meeting/Ongoing    Motivation, Barriers, and Compliance:  Evaluation of motivation, barriers, and compliance pending at this time due to clinical status.     Discharge Planning:   Monitor for potential discharge needs with multi-disciplinary team.          Follow-Up Parameters:   1-2 times per week (and more frequent as indicated)    Hulda La MPH, RD, CNSC, LDN   Pager: [phone]  Phone: [phone]  [email or web address]

## 2024-04-05 LAB — MAGNESIUM: MAGNESIUM: 2.1 mg/dL (ref 1.6–2.6)

## 2024-04-05 LAB — BLOOD GAS CRITICAL CARE PANEL, ARTERIAL
BASE EXCESS ARTERIAL: 5.8 — ABNORMAL HIGH (ref -2.0–2.0)
BASE EXCESS ARTERIAL: 3.7 — ABNORMAL HIGH (ref -2.0–2.0)
BASE EXCESS ARTERIAL: 6.4 — ABNORMAL HIGH (ref -2.0–2.0)
CALCIUM IONIZED ARTERIAL (MG/DL): 4.68 mg/dL (ref 4.40–5.40)
CALCIUM IONIZED ARTERIAL (MG/DL): 4.66 mg/dL (ref 4.40–5.40)
CALCIUM IONIZED ARTERIAL (MG/DL): 4.71 mg/dL (ref 4.40–5.40)
CARBOXYHEMOGLOBIN: 2.4 % — ABNORMAL HIGH (ref <1.2)
CARBOXYHEMOGLOBIN: 2.5 % — ABNORMAL HIGH (ref <1.2)
CARBOXYHEMOGLOBIN: 2.4 % — ABNORMAL HIGH (ref <1.2)
CHLORIDE, WHOLE BLOOD: 107 mmol/L (ref 98–107)
CHLORIDE, WHOLE BLOOD: 108 mmol/L — ABNORMAL HIGH (ref 98–107)
CHLORIDE, WHOLE BLOOD: 108 mmol/L — ABNORMAL HIGH (ref 98–107)
GLUCOSE WHOLE BLOOD: 148 mg/dL (ref 70–179)
GLUCOSE WHOLE BLOOD: 153 mg/dL (ref 70–179)
GLUCOSE WHOLE BLOOD: 150 mg/dL (ref 70–179)
HCO3 ARTERIAL: 30 mmol/L — ABNORMAL HIGH (ref 22–27)
HCO3 ARTERIAL: 27 mmol/L (ref 22–27)
HCO3 ARTERIAL: 30 mmol/L — ABNORMAL HIGH (ref 22–27)
HEMOGLOBIN BLOOD GAS: 8.6 g/dL — ABNORMAL LOW (ref 12.00–16.00)
HEMOGLOBIN BLOOD GAS: 7.7 g/dL — ABNORMAL LOW (ref 12.00–16.00)
HEMOGLOBIN BLOOD GAS: 8.1 g/dL — ABNORMAL LOW (ref 12.00–16.00)
LACTATE BLOOD ARTERIAL: 1.7 mmol/L — ABNORMAL HIGH (ref <1.3)
LACTATE BLOOD ARTERIAL: 1.6 mmol/L — ABNORMAL HIGH (ref <1.3)
LACTATE BLOOD ARTERIAL: 1.1 mmol/L (ref <1.3)
METHEMOGLOBIN: <1 % (ref <1.5)
METHEMOGLOBIN: <1 % (ref <1.5)
METHEMOGLOBIN: <1 % (ref <1.5)
O2 SATURATION ARTERIAL: 98.7 % (ref 94.0–100.0)
O2 SATURATION ARTERIAL: 99 % (ref 94.0–100.0)
O2 SATURATION ARTERIAL: 99.5 % (ref 94.0–100.0)
OXYHEMOGLOBIN: 95.5 % (ref 94.0–100.0)
OXYHEMOGLOBIN: 95.9 % (ref 94.0–100.0)
OXYHEMOGLOBIN: 96.6 % (ref 94.0–100.0)
PCO2 ARTERIAL: 41.7 mmHg (ref 35.0–45.0)
PCO2 ARTERIAL: 37 mmHg (ref 35.0–45.0)
PCO2 ARTERIAL: 37.9 mmHg (ref 35.0–45.0)
PH ARTERIAL: 7.46 — ABNORMAL HIGH (ref 7.35–7.45)
PH ARTERIAL: 7.48 — ABNORMAL HIGH (ref 7.35–7.45)
PH ARTERIAL: 7.51 — ABNORMAL HIGH (ref 7.35–7.45)
PO2 ARTERIAL: 99.8 mmHg (ref 80.0–110.0)
PO2 ARTERIAL: 101 mmHg (ref 80.0–110.0)
PO2 ARTERIAL: 104 mmHg (ref 80.0–110.0)
POTASSIUM WHOLE BLOOD: 3.3 mmol/L — ABNORMAL LOW (ref 3.4–4.6)
POTASSIUM WHOLE BLOOD: 3.2 mmol/L — ABNORMAL LOW (ref 3.4–4.6)
POTASSIUM WHOLE BLOOD: 3.4 mmol/L (ref 3.4–4.6)
SODIUM WHOLE BLOOD: 136 mmol/L (ref 135–145)
SODIUM WHOLE BLOOD: 137 mmol/L (ref 135–145)
SODIUM WHOLE BLOOD: 139 mmol/L (ref 135–145)

## 2024-04-05 LAB — BASIC METABOLIC PANEL
ANION GAP: 9 mmol/L (ref 5–14)
BLOOD UREA NITROGEN: 14 mg/dL (ref 9–23)
BUN / CREAT RATIO: 33
CALCIUM: 8.6 mg/dL — ABNORMAL LOW (ref 8.7–10.4)
CHLORIDE: 103 mmol/L (ref 98–107)
CO2: 31 mmol/L (ref 20.0–31.0)
CREATININE: 0.43 mg/dL — ABNORMAL LOW (ref 0.55–1.02)
EGFR CKD-EPI (2021) FEMALE: >90 mL/min/1.73m2 (ref >=60)
GLUCOSE RANDOM: 154 mg/dL (ref 70–179)
POTASSIUM: 3.6 mmol/L (ref 3.4–4.8)
SODIUM: 143 mmol/L (ref 135–145)

## 2024-04-05 LAB — HEPATIC FUNCTION PANEL
ALBUMIN: 2.4 g/dL — ABNORMAL LOW (ref 3.4–5.0)
ALKALINE PHOSPHATASE: 93 U/L (ref 46–116)
ALT (SGPT): 18 U/L (ref 10–49)
AST (SGOT): 26 U/L (ref <=34)
BILIRUBIN DIRECT: 0.7 mg/dL — ABNORMAL HIGH (ref 0.00–0.30)
BILIRUBIN TOTAL: 1.3 mg/dL — ABNORMAL HIGH (ref 0.3–1.2)
PROTEIN TOTAL: 4.9 g/dL — ABNORMAL LOW (ref 5.7–8.2)

## 2024-04-05 LAB — PHOSPHORUS: PHOSPHORUS: 2.6 mg/dL (ref 2.4–5.1)

## 2024-04-05 LAB — CBC W/ AUTO DIFF
BASOPHILS ABSOLUTE COUNT: 0 10*9/L (ref 0.0–0.1)
BASOPHILS RELATIVE PERCENT: 0.5 %
EOSINOPHILS ABSOLUTE COUNT: 0.1 10*9/L (ref 0.0–0.5)
EOSINOPHILS RELATIVE PERCENT: 1.6 %
HEMATOCRIT: 23.8 % — ABNORMAL LOW (ref 34.0–44.0)
HEMOGLOBIN: 8.1 g/dL — ABNORMAL LOW (ref 11.3–14.9)
LYMPHOCYTES ABSOLUTE COUNT: 0.6 10*9/L — ABNORMAL LOW (ref 1.1–3.6)
LYMPHOCYTES RELATIVE PERCENT: 11.2 %
MEAN CORPUSCULAR HEMOGLOBIN CONC: 34 g/dL (ref 32.0–36.0)
MEAN CORPUSCULAR HEMOGLOBIN: 33.1 pg — ABNORMAL HIGH (ref 25.9–32.4)
MEAN CORPUSCULAR VOLUME: 97.3 fL — ABNORMAL HIGH (ref 77.6–95.7)
MEAN PLATELET VOLUME: 8.3 fL (ref 6.8–10.7)
MONOCYTES ABSOLUTE COUNT: 0.8 10*9/L (ref 0.3–0.8)
MONOCYTES RELATIVE PERCENT: 14.9 %
NEUTROPHILS ABSOLUTE COUNT: 3.9 10*9/L (ref 1.8–7.8)
NEUTROPHILS RELATIVE PERCENT: 71.8 %
PLATELET COUNT: 103 10*9/L — ABNORMAL LOW (ref 150–450)
RED BLOOD CELL COUNT: 2.44 10*12/L — ABNORMAL LOW (ref 3.95–5.13)
RED CELL DISTRIBUTION WIDTH: 24.4 % — ABNORMAL HIGH (ref 12.2–15.2)
WBC ADJUSTED: 5.5 10*9/L (ref 3.6–11.2)

## 2024-04-05 LAB — CYSTATIN C
CYSTATIN C: 1.45 mg/L — ABNORMAL HIGH (ref 0.64–1.23)
EGFR CKD-EPI (2012) CYSTATIN C FEMALE: 41 mL/min/1.73m2 — ABNORMAL LOW (ref >=60)

## 2024-04-05 MED ADMIN — rifAXIMin (XIFAXAN) oral suspension: 550 mg | GASTROENTERAL | @ 18:00:00 | Stop: 2024-04-12

## 2024-04-05 MED ADMIN — folic acid (FOLVITE) tablet 1 mg: 1 mg | GASTROENTERAL | @ 13:00:00

## 2024-04-05 MED ADMIN — thiamine mononitrate (vit B1) tablet 100 mg: 100 mg | GASTROENTERAL | @ 13:00:00

## 2024-04-05 MED ADMIN — cyanocobalamin (vitamin B-12) tablet 1,000 mcg: 1000 ug | GASTROENTERAL | @ 13:00:00

## 2024-04-05 MED ADMIN — multivitamins, therapeutic with minerals tablet 1 tablet: 1 | GASTROENTERAL | @ 13:00:00

## 2024-04-05 MED ADMIN — apixaban (ELIQUIS) tablet 5 mg: 5 mg | GASTROENTERAL | @ 13:00:00

## 2024-04-05 MED ADMIN — apixaban (ELIQUIS) tablet 5 mg: 5 mg | GASTROENTERAL | @ 01:00:00

## 2024-04-05 MED ADMIN — lactulose oral solution: 40 g | GASTROENTERAL | @ 22:00:00

## 2024-04-05 MED ADMIN — lactulose oral solution: 40 g | GASTROENTERAL | @ 01:00:00

## 2024-04-05 MED ADMIN — lactulose oral solution: 40 g | GASTROENTERAL | @ 12:00:00

## 2024-04-05 MED ADMIN — lactulose oral solution: 40 g | GASTROENTERAL | @ 18:00:00

## 2024-04-05 MED ADMIN — pantoprazole (Protonix) injection 40 mg: 40 mg | INTRAVENOUS | @ 13:00:00

## 2024-04-05 MED ADMIN — potassium chloride 10 mEq in 100 mL IVPB: 10 meq | INTRAVENOUS | @ 14:00:00 | Stop: 2024-04-05

## 2024-04-05 MED ADMIN — potassium chloride 10 mEq in 100 mL IVPB: 10 meq | INTRAVENOUS | @ 13:00:00 | Stop: 2024-04-05

## 2024-04-05 MED ADMIN — potassium chloride 10 mEq in 100 mL IVPB: 10 meq | INTRAVENOUS | @ 16:00:00 | Stop: 2024-04-05

## 2024-04-05 MED ADMIN — furosemide (LASIX) injection 80 mg: 80 mg | INTRAVENOUS | @ 18:00:00 | Stop: 2024-04-05

## 2024-04-05 MED ADMIN — potassium chloride 10 mEq in 100 mL IVPB: 10 meq | INTRAVENOUS | @ 18:00:00 | Stop: 2024-04-05

## 2024-04-05 NOTE — Plan of Care (Signed)
 Patient did well overnight on PSV without any issues. This morning an attempt to lower PS to get patient on a true SBT was unsuccessful as her tidal volumes were too low with the lower PS raising her RSBI over 100. Patient was placed back to a PS of 14 and did well back on those settings due to the high PS patient does fail her SBT though. Airway remains patent and all supplies is at bedside.     Problem: Mechanical Ventilation Invasive  Goal: Effective Communication  Outcome: Ongoing - Unchanged  Goal: Optimal Device Function  Outcome: Ongoing - Unchanged  Intervention: Optimize Device Care and Function  Recent Flowsheet Documentation  Taken 04/05/2024 0228 by Canary Boer, RRT  Airway/Ventilation Management:   airway patency maintained   humidification applied   pulmonary hygiene promoted  Oral Care: suction provided  Taken 04/04/2024 2021 by Canary Boer, RRT  Airway/Ventilation Management:   airway patency maintained   humidification applied   pulmonary hygiene promoted  Oral Care: suction provided  Goal: Mechanical Ventilation Liberation  Outcome: Ongoing - Unchanged  Goal: Optimal Nutrition Delivery  Outcome: Ongoing - Unchanged  Goal: Absence of Device-Related Skin and Tissue Injury  Outcome: Ongoing - Unchanged  Goal: Absence of Ventilator-Induced Lung Injury  Outcome: Ongoing - Unchanged  Intervention: Prevent Ventilator-Associated Pneumonia  Recent Flowsheet Documentation  Taken 04/05/2024 0228 by Canary Boer, RRT  Head of Bed [facility]) Positioning: HOB elevated  VAP Prevention Bundle:   HOB elevation maintained   vent circuit breaks minimized  Oral Care: suction provided  Taken 04/04/2024 2021 by Canary Boer, RRT  Head of Bed [facility]) Positioning: HOB elevated  VAP Prevention Bundle:   HOB elevation maintained   vent circuit breaks minimized  Oral Care: suction provided

## 2024-04-05 NOTE — Progress Notes (Signed)
 OCCUPATIONAL THERAPY  Note Type: Progress Note (04/05/24 0819)    Patient Name: [name]   Medical Record Number: [id number]   Date of Birth: 1945/11/09  Sex: Female     Post-Discharge Occupational Therapy Recommendations:  OT Post Acute Discharge Recommendations: 5x weekly, Low intensity          Equipment Recommendation  OT DME Recommendations: Defer to post acute    OT Treatment Diagnosis: Treatment Diagnosis: Generalized muscle weakness, Limitation of activities due to disability, Need for assistance with personal care, Reduced mobility, Unsteadiness on feet         ASSESSMENT    Session Summary:         Assessment: Pt presents to session this day agreeable to OT treatment, awake/aware, communicative by shaking head yes/no. Nysia demos decreased activity tolerance, impaired functional participation, and limited ROM impacting ADLs. Pt will continue to benefit from skilled OT services to promote independence with self-care, improved activity tolerance, and increased functional strength.    Today's Interventions: Balance activities, Bed mobility, Education - Patient, Functional mobility, Endurance activities, Postular / Proximal stability, Range of motion, Positioning, UE Strength / coordination exercise  Today's Interventions: Pt requiring TotalAx2 for sup>sit 2/2 weakness, balance impairment. Pt completed sitting EOB for ~10 min with MaxAx1 for postural/trunk control w intermittent CGA for stabilization of BUE, anchor point to pull self into independent sitting. Pt able to pull self into seated position using OT for stability for ~30 sec intervals 3x. Pt participating but ultimately requiring TotalAx2 for sit>sup, bed mobility/positioning. Pt tolerated treatment well, was limited by fatigue. RT present throughout session to manage vent.    Problem List: Decreased range of motion, Decreased coordination, Decreased activity tolerance, Decreased strength, Impaired balance, Impaired fine motor skills, Decreased skin integrity, Pain, Increased edema, Decreased endurance, Impaired ADLs, Fall risk, Decreased mobility      Activity Tolerance During Today's Session  Activity Tolerance: Tolerated treatment well, Limited by fatigue    PLAN    Planned Frequency of Treatment: Plan of Care Initiated: 04/05/24  OT Daily Frequency: 1-2x per day Weekly Frequency: 3-4 days per week  Planned Treatment Duration: 04/18/24    Planned Interventions:  Planned Interventions: Clinical Research Associate, Education (Patient/Family/Caregiver), Self-Care/Home Training, Therapeutic Exercise, Therapeutic Activity, Neuromuscular Re-education      Prognosis  Prognosis: Good  Positive Indicators: PLOF  Barriers to Discharge: Inaccessible home environment, Decreased safety awareness, Endurance deficits, Cognitive deficits, Inability to safely perform ADLS, Functional strength deficits, Decreased range of motion, Severity of deficits    SUBJECTIVE  Communication Preference: Verbal,       Patient / Caregiver reports: Pt shaking head yes/no to OT questions about being in pain.  Equipment / Environment: Vascular access (PIV, TLC, Port-a-cath, PICC), Telemetry, Ventilatory support, Foley, NGT, Restraints    Prior functional status: PTA, pt indep with ADLs. Denied use of DME at baseline (per EMR uses RW/rollator). Lives with daughter, granddaughter, and husband. (-) drives.    Living Situation  Living Environment: House  Lives With: Spouse, Daughter, Family  Home Living: Multi-level home, Walk-in shower, Raised toilet seat without rails, Stairs to enter with rails, Stairs to alternate level with rails, Built-in shower seat  Rail placement (outside): Bilateral rails in reach  Number of Stairs to Enter (outside): 9  Rail placement (inside): Bilateral rails in reach  Number of Stairs to Alternate level (inside):  (needs clarification)  Caregiver Identified?: Yes  Caregiver Availability: 24 hours  Caregiver Ability: Supervision    Equipment available  at home: Rollator, Rolling walker     OBJECTIVE    Goals:  Patient and Family Goals: to get better    SHORT GOAL #1: Pt will complete OOB assessment   Date Established : 04/05/24   Time Frame : 2 weeks  Daily Interventions: Pt able to participate in EOB assessment w MaxA x1 for postural/trunk support and intermittent CGA x1 for balance, support of BUE. Pt able to pull self into sitting for approx ~30 seconds, completed this 3x. Pt tolerated sitting EOB ~10 minutes, VSS.   Plan : Goal Revised this Date, In Progress     SHORT GOAL #2: Pt will complete simple 1-step ADL task while seated with ModA.   Date Established : 04/05/24   Time Frame : 2 weeks   Daily Interventions : Pt followed 100% of 1-step commands this session. Pt able to lift BLE, BUE when cued. Pt attending to OT cues, tracking visually, and remaining communicative shaking head yes/no.   Plan : Goal Revised This Date, In Progress    SHORT GOAL #3: Pt will perform full body dressing with mod I + LRAD/compensatory strategies   Date Established : 03/01/24   Time Frame : 2 weeks   Daily Interventions : Goal not the focus of this session.   Plan : In Progress                        Long Term Goal #1: Pt will score 18+/24 on AMPAC  Time Frame: 4 weeks    Precautions: Falls precautions, Delirium Precautions     Weight Bearing Status: Non-applicable  Required Braces or Orthoses: Non-applicable    Vitals/Orthostatics: VSS throughout, BP remaining steady ~130/60 while sitting EOB.    Family/Caregiver present: none  Pain Comments: Pt denying pain, no s/s of pain observed.    Patient at end of session: In bed, Lines intact    Occupational Therapy Session Duration  OT Co-Treatment [mins]: 38 (Elizabeth L)               Prior function and Living Environment  Prior functional status: PTA, pt indep with ADLs. Denied use of DME at baseline (per EMR uses RW/rollator). Lives with daughter, granddaughter, and husband. (-) drives.    Living Situation  Living Environment: House  Lives With: Spouse, Daughter, Family  Home Living: Multi-level home, Walk-in shower, Raised toilet seat without rails, Stairs to enter with rails, Stairs to alternate level with rails, Built-in shower seat  Rail placement (outside): Bilateral rails in reach  Number of Stairs to Enter (outside): 9  Rail placement (inside): Bilateral rails in reach  Number of Stairs to Alternate level (inside):  (needs clarification)  Caregiver Identified?: Yes  Caregiver Availability: 24 hours  Caregiver Ability: Supervision    Equipment available at home: Rollator, Rolling walker    AM-PAC-Daily Activity  Lower Body Dressing assistance needs: (P) Unable to do/total assistance - Total Dependent Assist  Bathing assistance needs: (P) Unable to do/total assistance - Total Dependent Assist  Toileting assistance needs: (P) Unable to do/total assistance - Total Dependent Assist  Upper Body Dressing assistance needs: (P) Unable to do/total assistance - Total Dependent Assist  Personal Grooming assistance needs: (P) Unable to do/total assistance - Total Dependent Assist  Eating Meals assistance needs: (P) Unable to do/total assistance - Total Dependent Assist    Daily Activity Score: (P) 6    Score (in points): % of Functional Impairment, Limitation, Restriction  6: 100% impaired,  limited, restricted  7-8: At least 80%, but less than 100% impaired, limited restricted  9-13: At least 60%, but less than 80% impaired, limited restricted  14-19: At least 40%, but less than 60% impaired, limited restricted  20-22: At least 20%, but less than 40% impaired, limited restricted  23: At least 1%, but less than 20% impaired, limited restricted  24: 0% impaired, limited restricted    A student was present and participated in the care. Licensed/Credentialed therapist was physically present and immediately available to direct and supervise tasks that were related to patient management. The direction and supervision was continuous throughout the time these tasks were performed.      I attest that I have reviewed the above information.  Signed: Delon Faith, OT  Filed 04/05/2024      A student, Marylynn Labella, was present and participated in patient care today.   I was physically present and immediately available to direct and supervise tasks that were related to patient management.  The direction and supervision was continuous throughout the time these tasks were performed.         Delon Faith, OT

## 2024-04-05 NOTE — Plan of Care (Signed)
 Problem: Mechanical Ventilation Invasive  Goal: Effective Communication  Outcome: Progressing  Goal: Optimal Device Function  Outcome: Progressing  Intervention: Optimize Device Care and Function  Recent Flowsheet Documentation  Taken 04/05/2024 0844 by Dewight Counts D, RRT  Oral Care:   teeth brushed   tongue brushed   suction provided   mouth swabbed  Goal: Mechanical Ventilation Liberation  Outcome: Progressing  Goal: Optimal Nutrition Delivery  Outcome: Progressing  Goal: Absence of Device-Related Skin and Tissue Injury  Outcome: Progressing  Goal: Absence of Ventilator-Induced Lung Injury  Outcome: Progressing  Intervention: Prevent Ventilator-Associated Pneumonia  Recent Flowsheet Documentation  Taken 04/05/2024 0844 by Dewight Counts D, RRT  Oral Care:   teeth brushed   tongue brushed   suction provided   mouth swabbed     Problem: Mechanical Ventilation Invasive  Goal: Optimal Device Function  Intervention: Optimize Device Care and Function  Recent Flowsheet Documentation  Taken 04/05/2024 0844 by Dewight Counts D, RRT  Oral Care:   teeth brushed   tongue brushed   suction provided   mouth swabbed  Goal: Absence of Ventilator-Induced Lung Injury  Intervention: Prevent Ventilator-Associated Pneumonia  Recent Flowsheet Documentation  Taken 04/05/2024 0844 by Dewight Counts D, RRT  Oral Care:   teeth brushed   tongue brushed   suction provided   mouth swabbed     Problem: Mechanical Ventilation Invasive  Goal: Optimal Device Function  Intervention: Optimize Device Care and Function  Recent Flowsheet Documentation  Taken 04/05/2024 0844 by Dewight Counts D, RRT  Oral Care:   teeth brushed   tongue brushed   suction provided   mouth swabbed  Goal: Absence of Ventilator-Induced Lung Injury  Intervention: Prevent Ventilator-Associated Pneumonia  Recent Flowsheet Documentation  Taken 04/05/2024 0844 by Dewight Counts D, RRT  Oral Care:   teeth brushed   tongue brushed   suction provided   mouth swabbed Problem: Mechanical Ventilation Invasive  Goal: Optimal Device Function  Intervention: Optimize Device Care and Function  Recent Flowsheet Documentation  Taken 04/05/2024 0844 by Dewight Counts D, RRT  Oral Care:   teeth brushed   tongue brushed   suction provided   mouth swabbed  Goal: Absence of Ventilator-Induced Lung Injury  Intervention: Prevent Ventilator-Associated Pneumonia  Recent Flowsheet Documentation  Taken 04/05/2024 0844 by Dewight Counts D, RRT  Oral Care:   teeth brushed   tongue brushed   suction provided   mouth swabbed     Problem: Mechanical Ventilation Invasive  Goal: Optimal Device Function  Intervention: Optimize Device Care and Function  Recent Flowsheet Documentation  Taken 04/05/2024 0844 by Dewight Counts D, RRT  Oral Care:   teeth brushed   tongue brushed   suction provided   mouth swabbed  Goal: Absence of Ventilator-Induced Lung Injury  Intervention: Prevent Ventilator-Associated Pneumonia  Recent Flowsheet Documentation  Taken 04/05/2024 0844 by Dewight Counts D, RRT  Oral Care:   teeth brushed   tongue brushed   suction provided   mouth swabbed

## 2024-04-05 NOTE — Progress Notes (Signed)
 MICU Daily Progress Note     Date of Service: 04/05/2024    Problem List:   Principal Problem:    Bacteremia, escherichia coli  Active Problems:    Alcoholic liver disease (HHS-HCC)    HTN (hypertension)    Depression with anxiety    Class 2 severe obesity with serious comorbidity in adult    Atrial fibrillation    (CMS-HCC)    Pleural effusion    Hypernatremia    Thrombocytopenia    Cirrhosis    (CMS-HCC)    A-fib (CMS-HCC)    Hepatic encephalopathy    (CMS-HCC)      Interval history: [name] is a 78 y.o. female with HFpEF, HTN, Afib on AC, MASLD cirrhosis, originally hospitalized for scheduled TEE/DCCV (aborted d/t LAA clot) c/b AHHRF, encephalopathy and septic shock requiring MICU care, was s/p extubation transferred to [facility] for further management of anticoagulation and hypernatremia. Transferred back to [facility] MICU for closer monitoring for respiratory status, pressor requirement, GI bleed, and management of carotid artery injury.     On 10/24, patient had acute worsening of respiratory status leading requiring intubation. When patient was given sedation for intubation, she coded and promptly had ROSC after a few minutes of CPR. Subsequently, CVC placed in neck, used to draw blood, unclear if given pressors through it, then found  to be in arterial system based on blood gas and imaging, likely right carotid. Line was subsequently removed, and pressure was held for 15 minutes, hemostasis with no hematoma. She was transferred to [facility] MICU for management of this carotid injury, to be evaluated by vascular surgery while also managing her increased vasopressor requirement and respiratory distress.  Has been weaned off pressors since 10/27 at 1 PM. VBG's indicating that she is ready for extubation, however her mentation suggests that she is not able to protect her airway.  She was extubated on 10/31, and mentation slowly improved.  Unfortunately on the morning of 11/2 she had increased lethargy, increased secretions, and she was not protecting her airway.  She was reintubated on 11/2, and required pressor support with norepinephrine and vasopressin after the induction agents.     24 hour events:  - Continue lactulose , add rifaximin    - Diuresing with Lasix iso fluid overload   - Given repeat intubations, reached out to daughter to arrange goals of care meeting on Wednesday 11/5am.  Plan to discuss overall trajectory and what to do in case she fails extubation once again.    Neurological   Hepatic encephalopathy  Per chart review at [facility], rapid response called on 10/24 for increased somnolence. At the time, differential included hepatic encephalopathy, toxic metabolic encephalopathy in the setting of shock (possibly sepsis), versus intracranial pathology. She was subsequently intubated. CT head on 10/24 showed no acute intracranial abnormality. Weaned off sedation with slow improvements in neurological status.  Plan to extubate when mental status improves.  - Continue lactulose , target 3-5 BMs daily.  MRI brain was impeded due to movement, however with a saw was unremarkable.  - Start Rifaximin    - treating Hypernatremia as below which may also be contributing    Sedation for Mechanical Ventilation  - PRN fentanyl  as needed, however, current mental status is poor    Analgesia: No pain issues  RASS at goal? N/A, not on sedation  [place] Agitation Assessment Scale (RASS) : 0 (04/05/2024 12:00 PM)    Pulmonary   Acute Hypoxic Hypercarbic Respiratory Failure (reintubated 11/2)  Rapid response called  on 10/24 for increased somnolence, found to have acute hypercarbic respiratory failure and subsequently intubated. Suspect hypercarbic respiratory failure due to encephalopathy as above. CXR on 10/24 showed worsening pleural effusions. Not currently getting diuresed. Given patient's hypoxia, and AMS, there was concern for infectious etiology. MRSA nares negative. Infectious workup thus far is unremarkable. SBTs show readiness extubation based on VBGs, however there is concern that she continues to be unable to protect her airway due to her current mentation.  Reintubated on 11/2 followed by therapeutic bronchoscopy.  - Daily SAT/SBT screening      Cardiovascular   Carotid Artery Injury (resolved)  At [facility], CVC placed intended for RIJ on 10/24 following intubation, PEA, ROSC. Was used with known blood return. Unclear if it was used for pressor support, but highly likely that it was. CXR for placement check showed concern for intra aterial location, and blood gas confirmed it was arterial. Thought to be in carotid artery. CVC removed with pressure held for 15 minutes, with no hematoma formation. On arrival, no physical exam and bedside ultrasound showed no large hematoma. No active bleeding.   - Vascular Surgery consulted, stated fistula has healed based on Ultrasound findings    PEA arrest s/p ROSC  Hypovolemic/Hemorrhagic shock (resolved)  Atrial fibrillation  known left atrial appendage clot  HFpEF  Patient with PEA arrest peri-intubation with ROSC. Shock thought to be hemorrhagic in the setting of GI bleed History of atrial fibrillation with known LAA clot and HFpEF. Vasopressors were weaned off 10/27. APTT was elevated the morning of 10/28 necessitating changing anticoagulation from therapeutic heparin to therapeutic Lovenox .   - Holding Coreg   - Continue home Eliquis for anticoagulation for LAA clot    Renal   Hypernatremia (resolved)  At [facility], had persistently hypernatremia near 153. Had been treated with D5 gtt. Sodium throughout admission has largely been 146-148. Sodium was 151 morning of 10/28, presume this will decrease once we start tube feeds.  Sodium has overall normalized and continues to be within normal limits.  - Strict I/O's  - Sodium check q6hours   - FWF with TF, adjusting as needed    Infectious Disease/Autoimmune   E. Coli Bacteremia (resolved),   Initially admitted for afib, found to have E. coli bacteremia on October 10. This was treated with cefazolin . Rapid response called 10/24, patient noted to be hypothermic. WBCs jumped from 3.0 to 9.7 on 10/24. Although the most recent lab was following intubation and CPR. UA with pyuria, rare bacteria, hematuria. Peri-intubation, patient developed significant hypotension requiring initiation of NE and vasopressin. Suspect possible septic shock with unknown infectious source. Patient started on vancomycin and cefepime 10/24. CT Abdomen pelvis concerning for aspiration pneumonia. Following infectious workup was unremarkable. Antibiotics were stopped on 10/27.  Infectious workup was repeated on 11/3 due to reintubation and status post bronchoscopy.  - follow blood, urine cultures to final    Cultures:  Blood Culture, Routine (no units)   Date Value   04/04/2024 No Growth at 24 hours   04/04/2024 No Growth at 24 hours     Urine Culture, Comprehensive (no units)   Date Value   04/04/2024 NO GROWTH   03/25/2024 <10,000 CFU/mL     Lower Respiratory Culture (no units)   Date Value   04/04/2024 TOO YOUNG TO READ   03/29/2024 Specimen Not Processed     WBC (10*9/L)   Date Value   04/05/2024 5.5     WBC, UA (/HPF)   Date Value  03/25/2024 25 (H)       FEN/GI   Sigmoid Mass c/f Malignancy and Hematochezia (resolved)  Hemorrhagic Shock (resolved)  CT abdomen pelvis done 10/24 with evidence of cirrhosis and masslike thickening of the lower sigmoid colon concerning for malignancy.  CTA abdomen pelvis performed 1024 with active extravasation into the gastric fundus possibly secondary to traumatic enteric tube placement.  GI scoped the patient on 10/25 and clipped an area of active bleeding.  They removed a total of 1.5 L of blood.  Hemoglobin 10/20 6 AM downtrended to 6.0, so 2 units of blood transfused.  GI stated this was consistent with 10/25 scope and likely did not represent increased bleeding. Hb steadily improving with Hb of 9.2 on 10/28.   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 Given shock of unclear etiology. CTAP demonstrating cirrhosis. Likely that cirrhosis is contributing to hypotension.   - Continue lactulose ; target 3-5 BMs daily  - NGT in place, on TF and FWF  - Daily MELD labs and CMP     MELD 3.0: 15 at 04/05/2024  4:31 AM  MELD-Na: 13 at 04/05/2024  4:31 AM  Calculated from:  Serum Creatinine: 0.43 mg/dL (Using min of 1 mg/dL) at 88/09/7972  5:68 AM  Serum Sodium: 143 mmol/L (Using max of 137 mmol/L) at 04/05/2024  4:31 AM  Total Bilirubin: 1.3 mg/dL at 88/09/7972  5:68 AM  Serum Albumin: 2.4 g/dL at 88/09/7972  5:68 AM  INR(ratio): 1.61 at 04/03/2024 10:26 AM  Age at listing (hypothetical): 78 years  Sex: Female at 04/05/2024  4:31 AM    Provider Malnutrition Assessment:  Body mass index is 39.22 kg/m??. BMI Interpretation: >/= 30 and < 40, consistent with obesity, clinically significant requiring additional resources and complicating multiple aspects of patient care.  GLIM criteria:   Pt does not meet criteria  -I have screened this patient for malnutrition and they did NOT meet criteria for malnutrition based on GLIM criteria.  -TF and FWF; Dietician consulted, appreciate assistance    Heme/Coag   Acute blood loss anemia  Hemoglobin down trended to 6.0 on the morning of 10/26 thought to be secondary to acute GI bleed that was scoped and clipped 10/25. Patient received 2 units PRBC 10/26 and an additional unit 10/27. Hb steadily improving with Hb of 9.2 on 10/28.   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 Reason: Not indicated.    Prophylaxis/LDA/Restraints/Consults   ICU Checklist completed: yes (see ICU rounding navigator in Epic)    Patient Lines/Drains/Airways Status       Active Active Lines, Drains, & Airways       Name Placement date Placement time Site Days    ETT  7.5 04/03/24  0702  -- 2    CVC Triple Lumen 04/03/24 Left Internal jugular 04/03/24  2037  Internal jugular  1    NG/OG Tube Feedings 10 Fr. Right nostril 04/01/24  1138  Right nostril  4    NG/OG Tube Decompression Right mouth 04/03/24  1500  Right mouth  1  Urethral Catheter Temperature probe 16 Fr. 04/03/24  0830  Temperature probe  2    Peripheral IV 03/20/24 Anterior;Left;Upper Arm 03/20/24  1730  Arm  15    Arterial Line 04/03/24 Right Radial 04/03/24  0830  Radial  2                  Patient Lines/Drains/Airways Status       Active Wounds       Name Placement date Placement time Site Days    Wound 03/01/24 Other (Comment) Toe (Comment which one) Left;Other (Comment) Blister present on arrival, tx already in outpatient setting, in process of healing, surrounding erythema 03/01/24  0129  Toe (Comment which one)  35    Wound 03/13/24 Irritant Contact Dermatitis Incontinence Sacrum Mid gluteal cleft MASD? 03/13/24  --  Sacrum  23                  Goals of Care     Code Status:   Orders Placed This Encounter   Procedures    Full Code     Standing Status:   Standing     Number of Occurrences:   1        Designated Healthcare Decision Maker:  Ms. Menard designated healthcare decision maker(s) is/are   HCDM (patient stated preference): Rozenberg,[name] - Spouse - [phone]    HCDM, First AlternateXiao, Graul - Daughter - [phone]. See HCDM section of Epic sidebar/storyboard or ACP tab in patient chart for details regarding active HCDMs and patient capacity for decision-making.      Subjective     Patient is resting comfortably. Able to answer yes/no questions and following commands.    Objective     Vitals - past 24 hours  Pulse:  [95-130] 105  SpO2 Pulse:  [95-129] 114  Resp:  [12-36] 26  FiO2 (%):  [30 %-35 %] 35 %  SpO2:  [92 %-100 %] 98 % Intake/Output  I/O last 3 completed shifts:  In: 4585.8 [I.V.:167; NG/GT:4190; IV Piggyback:228.9]  Out: 5500 [Urine:2550; Emesis/NG output:1100; Stool:1850]     Physical Exam:    General: intubated, opening eyes spontaneously  HEENT: normocephalic, atraumatic   CV: pulses palpable, regular borderline tachycardia   Pulm: Rhonchi w transmitted upper airway sounds throughout  GI: soft, NTND, + BS  MSK: 3+ pitting edema noted to her knees bilaterally, no clubbing or cyanosis  Skin: numerous large flat violaceous ecchymosis on left arm, chest, neck bilaterally.   Neuro: Withdrawals to painful stimuli. Responding to name and now reliably following commands.     Continuous Infusions:   Infusions Meds[1]    Scheduled Medications:   Scheduled Medications[2]    PRN medications:  PRN Medications[3]    Data/Imaging Review: Reviewed in Epic and personally interpreted on 04/05/2024. See EMR for detailed results.        Lelend Heinecke S Daci Stubbe, MD  PGY1 Internal Medicine, [facility]                   [1]    NORepinephrine bitartrate-NS Stopped (04/04/24 1600)    vasopressin Stopped (04/03/24 0715)   [2]    apixaban  5 mg Enteral tube: gastric BID    cyanocobalamin (vitamin B-12)  1,000 mcg Enteral tube: gastric Daily    flu vac 2025 [id number](PF)  0.5 mL Intramuscular During hospitalization    folic acid   1 mg Enteral tube: gastric Daily    lactulose   40 g Enteral tube: gastric QID  multivitamins (ADULT)  1 tablet Enteral tube: gastric Daily    pantoprazole (Protonix) intravenous solution  40 mg Intravenous Daily    rifAXIMin   550 mg Enteral tube: gastric BID    thiamine  mononitrate (vit B1)  100 mg Enteral tube: gastric Daily   [3] acetaminophen , docusate sodium, fentaNYL  (PF) **OR** fentaNYL  (PF)

## 2024-04-06 LAB — BLOOD GAS CRITICAL CARE PANEL, ARTERIAL
BASE EXCESS ARTERIAL: 7 — ABNORMAL HIGH (ref -2.0–2.0)
BASE EXCESS ARTERIAL: 7.3 — ABNORMAL HIGH (ref -2.0–2.0)
BASE EXCESS ARTERIAL: 4.4 — ABNORMAL HIGH (ref -2.0–2.0)
BASE EXCESS ARTERIAL: 4.9 — ABNORMAL HIGH (ref -2.0–2.0)
BASE EXCESS ARTERIAL: 6.8 — ABNORMAL HIGH (ref -2.0–2.0)
CALCIUM IONIZED ARTERIAL (MG/DL): 4.73 mg/dL (ref 4.40–5.40)
CALCIUM IONIZED ARTERIAL (MG/DL): 4.74 mg/dL (ref 4.40–5.40)
CALCIUM IONIZED ARTERIAL (MG/DL): 5.01 mg/dL (ref 4.40–5.40)
CALCIUM IONIZED ARTERIAL (MG/DL): 4.86 mg/dL (ref 4.40–5.40)
CALCIUM IONIZED ARTERIAL (MG/DL): 4.79 mg/dL (ref 4.40–5.40)
CARBOXYHEMOGLOBIN: 1.7 % — ABNORMAL HIGH (ref <1.2)
CARBOXYHEMOGLOBIN: 1.7 % — ABNORMAL HIGH (ref <1.2)
CARBOXYHEMOGLOBIN: 2.2 % — ABNORMAL HIGH (ref <1.2)
CARBOXYHEMOGLOBIN: 2.3 % — ABNORMAL HIGH (ref <1.2)
CARBOXYHEMOGLOBIN: 2.4 % — ABNORMAL HIGH (ref <1.2)
CHLORIDE, WHOLE BLOOD: 107 mmol/L (ref 98–107)
CHLORIDE, WHOLE BLOOD: 109 mmol/L — ABNORMAL HIGH (ref 98–107)
CHLORIDE, WHOLE BLOOD: 106 mmol/L (ref 98–107)
GLUCOSE WHOLE BLOOD: 143 mg/dL (ref 70–179)
GLUCOSE WHOLE BLOOD: 144 mg/dL (ref 70–179)
GLUCOSE WHOLE BLOOD: 174 mg/dL (ref 70–179)
GLUCOSE WHOLE BLOOD: 141 mg/dL (ref 70–179)
GLUCOSE WHOLE BLOOD: 127 mg/dL (ref 70–179)
HCO3 ARTERIAL: 31 mmol/L — ABNORMAL HIGH (ref 22–27)
HCO3 ARTERIAL: 31 mmol/L — ABNORMAL HIGH (ref 22–27)
HCO3 ARTERIAL: 29 mmol/L — ABNORMAL HIGH (ref 22–27)
HCO3 ARTERIAL: 29 mmol/L — ABNORMAL HIGH (ref 22–27)
HCO3 ARTERIAL: 31 mmol/L — ABNORMAL HIGH (ref 22–27)
HEMOGLOBIN BLOOD GAS: 8.1 g/dL — ABNORMAL LOW (ref 12.00–16.00)
HEMOGLOBIN BLOOD GAS: 8.1 g/dL — ABNORMAL LOW (ref 12.00–16.00)
HEMOGLOBIN BLOOD GAS: 9.5 g/dL — ABNORMAL LOW (ref 12.00–16.00)
HEMOGLOBIN BLOOD GAS: 7.8 g/dL — ABNORMAL LOW (ref 12.00–16.00)
HEMOGLOBIN BLOOD GAS: 8.4 g/dL — ABNORMAL LOW (ref 12.00–16.00)
LACTATE BLOOD ARTERIAL: 0.9 mmol/L (ref <1.3)
LACTATE BLOOD ARTERIAL: 0.9 mmol/L (ref <1.3)
LACTATE BLOOD ARTERIAL: 0.9 mmol/L (ref <1.3)
LACTATE BLOOD ARTERIAL: 0.8 mmol/L (ref <1.3)
LACTATE BLOOD ARTERIAL: 1.1 mmol/L (ref <1.3)
METHEMOGLOBIN: <1 % (ref <1.5)
METHEMOGLOBIN: <1 % (ref <1.5)
METHEMOGLOBIN: <1 % (ref <1.5)
METHEMOGLOBIN: <1 % (ref <1.5)
METHEMOGLOBIN: <1 % (ref <1.5)
O2 SATURATION ARTERIAL: 99.6 % (ref 94.0–100.0)
O2 SATURATION ARTERIAL: 99.7 % (ref 94.0–100.0)
O2 SATURATION ARTERIAL: 99.2 % (ref 94.0–100.0)
O2 SATURATION ARTERIAL: 99.7 % (ref 94.0–100.0)
O2 SATURATION ARTERIAL: 99.2 % (ref 94.0–100.0)
OXYHEMOGLOBIN: 97.5 % (ref 94.0–100.0)
OXYHEMOGLOBIN: 97.5 % (ref 94.0–100.0)
OXYHEMOGLOBIN: 96.3 % (ref 94.0–100.0)
OXYHEMOGLOBIN: 97.2 % (ref 94.0–100.0)
OXYHEMOGLOBIN: 96.1 % (ref 94.0–100.0)
PCO2 ARTERIAL: 39.1 mmHg (ref 35.0–45.0)
PCO2 ARTERIAL: 39.1 mmHg (ref 35.0–45.0)
PCO2 ARTERIAL: 48.4 mmHg — ABNORMAL HIGH (ref 35.0–45.0)
PCO2 ARTERIAL: 47 mmHg — ABNORMAL HIGH (ref 35.0–45.0)
PCO2 ARTERIAL: 45.7 mmHg — ABNORMAL HIGH (ref 35.0–45.0)
PH ARTERIAL: 7.5 — ABNORMAL HIGH (ref 7.35–7.45)
PH ARTERIAL: 7.51 — ABNORMAL HIGH (ref 7.35–7.45)
PH ARTERIAL: 7.4 (ref 7.35–7.45)
PH ARTERIAL: 7.41 (ref 7.35–7.45)
PH ARTERIAL: 7.45 (ref 7.35–7.45)
PO2 ARTERIAL: 111 mmHg — ABNORMAL HIGH (ref 80.0–110.0)
PO2 ARTERIAL: 120 mmHg — ABNORMAL HIGH (ref 80.0–110.0)
PO2 ARTERIAL: 110 mmHg (ref 80.0–110.0)
PO2 ARTERIAL: 109 mmHg (ref 80.0–110.0)
PO2 ARTERIAL: 107 mmHg (ref 80.0–110.0)
POTASSIUM WHOLE BLOOD: 3.4 mmol/L (ref 3.4–4.6)
POTASSIUM WHOLE BLOOD: 3.4 mmol/L (ref 3.4–4.6)
POTASSIUM WHOLE BLOOD: 4.2 mmol/L (ref 3.4–4.6)
POTASSIUM WHOLE BLOOD: 4.1 mmol/L (ref 3.4–4.6)
POTASSIUM WHOLE BLOOD: 3.8 mmol/L (ref 3.4–4.6)
SODIUM WHOLE BLOOD: 140 mmol/L (ref 135–145)
SODIUM WHOLE BLOOD: 140 mmol/L (ref 135–145)
SODIUM WHOLE BLOOD: 142 mmol/L (ref 135–145)
SODIUM WHOLE BLOOD: 142 mmol/L (ref 135–145)
SODIUM WHOLE BLOOD: 144 mmol/L (ref 135–145)

## 2024-04-06 LAB — BASIC METABOLIC PANEL
ANION GAP: 11 mmol/L (ref 5–14)
ANION GAP: 7 mmol/L (ref 5–14)
BLOOD UREA NITROGEN: 13 mg/dL (ref 9–23)
BLOOD UREA NITROGEN: 13 mg/dL (ref 9–23)
BUN / CREAT RATIO: 27
BUN / CREAT RATIO: 28
CALCIUM: 8.4 mg/dL — ABNORMAL LOW (ref 8.7–10.4)
CALCIUM: 8.3 mg/dL — ABNORMAL LOW (ref 8.7–10.4)
CHLORIDE: 104 mmol/L (ref 98–107)
CHLORIDE: 106 mmol/L (ref 98–107)
CO2: 30 mmol/L (ref 20.0–31.0)
CO2: 32 mmol/L — ABNORMAL HIGH (ref 20.0–31.0)
CREATININE: 0.49 mg/dL — ABNORMAL LOW (ref 0.55–1.02)
CREATININE: 0.46 mg/dL — ABNORMAL LOW (ref 0.55–1.02)
EGFR CKD-EPI (2021) FEMALE: >90 mL/min/1.73m2 (ref >=60)
EGFR CKD-EPI (2021) FEMALE: >90 mL/min/1.73m2 (ref >=60)
GLUCOSE RANDOM: 154 mg/dL (ref 70–179)
GLUCOSE RANDOM: 118 mg/dL — ABNORMAL HIGH (ref 70–99)
POTASSIUM: 3.8 mmol/L (ref 3.4–4.8)
POTASSIUM: 3.8 mmol/L (ref 3.4–4.8)
SODIUM: 145 mmol/L (ref 135–145)
SODIUM: 145 mmol/L (ref 135–145)

## 2024-04-06 LAB — SLIDE REVIEW

## 2024-04-06 LAB — CBC W/ AUTO DIFF
BASOPHILS ABSOLUTE COUNT: 0 10*9/L (ref 0.0–0.1)
BASOPHILS RELATIVE PERCENT: 0.6 %
EOSINOPHILS ABSOLUTE COUNT: 0.1 10*9/L (ref 0.0–0.5)
EOSINOPHILS RELATIVE PERCENT: 2.2 %
HEMATOCRIT: 24.4 % — ABNORMAL LOW (ref 34.0–44.0)
HEMOGLOBIN: 8.1 g/dL — ABNORMAL LOW (ref 11.3–14.9)
LYMPHOCYTES ABSOLUTE COUNT: 0.6 10*9/L — ABNORMAL LOW (ref 1.1–3.6)
LYMPHOCYTES RELATIVE PERCENT: 12 %
MEAN CORPUSCULAR HEMOGLOBIN CONC: 33.4 g/dL (ref 32.0–36.0)
MEAN CORPUSCULAR HEMOGLOBIN: 33.1 pg — ABNORMAL HIGH (ref 25.9–32.4)
MEAN CORPUSCULAR VOLUME: 99.1 fL — ABNORMAL HIGH (ref 77.6–95.7)
MEAN PLATELET VOLUME: 8.6 fL (ref 6.8–10.7)
MONOCYTES ABSOLUTE COUNT: 0.7 10*9/L (ref 0.3–0.8)
MONOCYTES RELATIVE PERCENT: 14.9 %
NEUTROPHILS ABSOLUTE COUNT: 3.4 10*9/L (ref 1.8–7.8)
NEUTROPHILS RELATIVE PERCENT: 70.3 %
PLATELET COUNT: 133 10*9/L — ABNORMAL LOW (ref 150–450)
RED BLOOD CELL COUNT: 2.46 10*12/L — ABNORMAL LOW (ref 3.95–5.13)
RED CELL DISTRIBUTION WIDTH: 24.7 % — ABNORMAL HIGH (ref 12.2–15.2)
WBC ADJUSTED: 4.9 10*9/L (ref 3.6–11.2)

## 2024-04-06 LAB — HEPATIC FUNCTION PANEL
ALBUMIN: 2.4 g/dL — ABNORMAL LOW (ref 3.4–5.0)
ALKALINE PHOSPHATASE: 105 U/L (ref 46–116)
ALT (SGPT): 20 U/L (ref 10–49)
AST (SGOT): 36 U/L — ABNORMAL HIGH (ref <=34)
BILIRUBIN DIRECT: 0.6 mg/dL — ABNORMAL HIGH (ref 0.00–0.30)
BILIRUBIN TOTAL: 1.2 mg/dL (ref 0.3–1.2)
PROTEIN TOTAL: 5 g/dL — ABNORMAL LOW (ref 5.7–8.2)

## 2024-04-06 LAB — MAGNESIUM
MAGNESIUM: 1.9 mg/dL (ref 1.6–2.6)
MAGNESIUM: 1.7 mg/dL (ref 1.6–2.6)

## 2024-04-06 LAB — PHOSPHORUS
PHOSPHORUS: 2.3 mg/dL — ABNORMAL LOW (ref 2.4–5.1)
PHOSPHORUS: 3.3 mg/dL (ref 2.4–5.1)

## 2024-04-06 LAB — CYSTATIN C
CYSTATIN C: 1.56 mg/L — ABNORMAL HIGH (ref 0.64–1.23)
EGFR CKD-EPI (2012) CYSTATIN C FEMALE: 38 mL/min/1.73m2 — ABNORMAL LOW (ref >=60)

## 2024-04-06 MED ADMIN — sodium chloride (NS) 0.9 % flush 10 mL: 10 mL | INTRAVENOUS

## 2024-04-06 MED ADMIN — rifAXIMin (XIFAXAN) oral suspension: 550 mg | GASTROENTERAL | @ 02:00:00 | Stop: 2024-04-12

## 2024-04-06 MED ADMIN — rifAXIMin (XIFAXAN) oral suspension: 550 mg | GASTROENTERAL | @ 14:00:00 | Stop: 2024-04-12

## 2024-04-06 MED ADMIN — folic acid (FOLVITE) tablet 1 mg: 1 mg | GASTROENTERAL | @ 14:00:00

## 2024-04-06 MED ADMIN — thiamine mononitrate (vit B1) tablet 100 mg: 100 mg | GASTROENTERAL | @ 14:00:00

## 2024-04-06 MED ADMIN — cyanocobalamin (vitamin B-12) tablet 1,000 mcg: 1000 ug | GASTROENTERAL | @ 14:00:00

## 2024-04-06 MED ADMIN — lactulose oral solution: 30 g | GASTROENTERAL | @ 18:00:00

## 2024-04-06 MED ADMIN — esomeprazole (NEXIUM) granules 40 mg: 40 mg | GASTROENTERAL | @ 14:00:00

## 2024-04-06 MED ADMIN — multivitamins, therapeutic with minerals tablet 1 tablet: 1 | GASTROENTERAL | @ 14:00:00

## 2024-04-06 MED ADMIN — apixaban (ELIQUIS) tablet 5 mg: 5 mg | GASTROENTERAL | @ 02:00:00

## 2024-04-06 MED ADMIN — apixaban (ELIQUIS) tablet 5 mg: 5 mg | GASTROENTERAL | @ 14:00:00

## 2024-04-06 MED ADMIN — potassium chloride 20 mEq in 100 mL IVPB Premix: 20 meq | INTRAVENOUS | @ 10:00:00 | Stop: 2024-04-06

## 2024-04-06 MED ADMIN — potassium phosphate 20 mmol in sodium chloride (NS) 0.9 % 500 mL infusion: 20 mmol | INTRAVENOUS | @ 10:00:00 | Stop: 2024-04-06

## 2024-04-06 MED ADMIN — furosemide (LASIX) injection 80 mg: 80 mg | INTRAVENOUS | @ 18:00:00 | Stop: 2024-04-06

## 2024-04-06 NOTE — Plan of Care (Signed)
 Problem: Skin Injury Risk Increased  Goal: Skin Health and Integrity  Intervention: Optimize Skin Protection  Recent Flowsheet Documentation  Taken 04/05/2024 2000 by Baldwin Katrinka RAMAN, RN  Activity Management: bedrest  Pressure Reduction Techniques:   frequent weight shift encouraged   weight shift assistance provided   sit time limited to 2 hours  Head of Bed (HOB) Positioning: HOB at 30-45 degrees  Pressure Reduction Devices:   positioning supports utilized   pressure-redistributing mattress utilized     Problem: Adult Inpatient Plan of Care  Goal: Absence of Hospital-Acquired Illness or Injury  Intervention: Identify and Manage Fall Risk  Recent Flowsheet Documentation  Taken 04/05/2024 2000 by Baldwin Katrinka RAMAN, RN  Safety Interventions:   aspiration precautions   lighting adjusted for tasks/safety   low bed  Intervention: Prevent Skin Injury  Recent Flowsheet Documentation  Taken 04/06/2024 0400 by Baldwin Katrinka RAMAN, RN  Positioning for Skin: Right  Taken 04/06/2024 0200 by Baldwin Katrinka RAMAN, RN  Positioning for Skin: Left  Taken 04/06/2024 0000 by Baldwin Katrinka RAMAN, RN  Positioning for Skin: Right  Taken 04/05/2024 2200 by Baldwin Katrinka RAMAN, RN  Positioning for Skin: Left  Taken 04/05/2024 2000 by Baldwin Katrinka RAMAN, RN  Positioning for Skin: Right  Intervention: Prevent Infection  Recent Flowsheet Documentation  Taken 04/05/2024 2000 by Baldwin Katrinka RAMAN, RN  Infection Prevention:   cohorting utilized   hand hygiene promoted     Problem: Breathing Pattern Ineffective  Goal: Effective Breathing Pattern  Intervention: Promote Improved Breathing Pattern  Recent Flowsheet Documentation  Taken 04/05/2024 2000 by Baldwin Katrinka RAMAN, RN  Head of Bed [facility]) Positioning: HOB at 30-45 degrees     Problem: Fall Injury Risk  Goal: Absence of Fall and Fall-Related Injury  Intervention: Promote Injury-Free Environment  Recent Flowsheet Documentation  Taken 04/05/2024 2000 by Baldwin Katrinka RAMAN, RN  Safety Interventions:   aspiration precautions   lighting adjusted for tasks/safety   low bed     Problem: Gas Exchange Impaired  Goal: Optimal Gas Exchange  Intervention: Optimize Oxygenation and Ventilation  Recent Flowsheet Documentation  Taken 04/05/2024 2000 by Baldwin Katrinka RAMAN, RN  Head of Bed [facility]) Positioning: HOB at 30-45 degrees     Problem: Non-Violent Restraints  Intervention: Utilize least restrictive measures  Recent Flowsheet Documentation  Taken 04/06/2024 0400 by Baldwin Katrinka RAMAN, RN  Less Restrictive Alternative:   1:1 patient care   Repositioning  Taken 04/06/2024 0356 by Baldwin Katrinka RAMAN, RN  Less Restrictive Alternative:   1:1 patient care   Repositioning  Taken 04/06/2024 0200 by Baldwin Katrinka RAMAN, RN  Less Restrictive Alternative:   1:1 patient care   Repositioning  Taken 04/06/2024 0000 by Baldwin Katrinka RAMAN, RN  Less Restrictive Alternative:   1:1 patient care   Repositioning  Taken 04/05/2024 2200 by Baldwin Katrinka RAMAN, RN  Less Restrictive Alternative:   1:1 patient care   Repositioning  Taken 04/05/2024 2000 by Baldwin Katrinka RAMAN, RN  Less Restrictive Alternative:   1:1 patient care   Repositioning  Intervention: Patient Monitoring  Recent Flowsheet Documentation  Taken 04/06/2024 0400 by Baldwin Katrinka RAMAN, RN  Psychological Status/Visual Check: Subdued  Circulation/Skin Integrity: No signs of injury  Range of Motion: Performed  Fluids: NPO  Food/Meal: Enteral feeding/TPN  Elimination: Urinary catheter  Taken 04/06/2024 0356 by Baldwin Katrinka RAMAN, RN  Psychological Status/Visual Check: Agitated/restless  Circulation/Skin Integrity: No signs of injury  Range of Motion: Performed  Fluids: NPO  Food/Meal: Enteral feeding/TPN  Elimination: Urinary catheter  Taken 04/06/2024 0200 by Baldwin Katrinka RAMAN, RN  Psychological Status/Visual Check: Subdued  Circulation/Skin Integrity: No signs of injury  Range of Motion: Performed  Fluids: NPO  Food/Meal: Enteral feeding/TPN  Elimination: Urinary catheter  Taken 04/06/2024 0000 by Baldwin Katrinka RAMAN, RN  Psychological Status/Visual Check: Subdued  Circulation/Skin Integrity: No signs of injury  Range of Motion: Performed  Fluids: NPO  Food/Meal: Enteral feeding/TPN  Elimination: Urinary catheter  Taken 04/05/2024 2200 by Baldwin Katrinka RAMAN, RN  Psychological Status/Visual Check: Subdued  Circulation/Skin Integrity: No signs of injury  Range of Motion: Performed  Fluids: NPO  Food/Meal: Enteral feeding/TPN  Elimination: Urinary catheter  Taken 04/05/2024 2000 by Baldwin Katrinka RAMAN, RN  Psychological Status/Visual Check: Subdued  Circulation/Skin Integrity: No signs of injury  Range of Motion: Performed  Fluids: NPO  Food/Meal: Enteral feeding/TPN  Elimination: Urinary catheter  Intervention: Patient Education  Recent Flowsheet Documentation  Taken 04/06/2024 0356 by Baldwin Katrinka RAMAN, RN  Criteria Explained: Yes  Patient's Response: Patient Unable to respond  Family Notification: Other     Problem: Wound  Goal: Optimal Functional Ability  Intervention: Optimize Functional Ability  Recent Flowsheet Documentation  Taken 04/05/2024 2000 by Baldwin Katrinka RAMAN, RN  Activity Management: bedrest  Goal: Skin Health and Integrity  Intervention: Optimize Skin Protection  Recent Flowsheet Documentation  Taken 04/05/2024 2000 by Baldwin Katrinka RAMAN, RN  Activity Management: bedrest  Pressure Reduction Techniques:   frequent weight shift encouraged   weight shift assistance provided   sit time limited to 2 hours  Head of Bed (HOB) Positioning: HOB at 30-45 degrees  Pressure Reduction Devices:   positioning supports utilized   pressure-redistributing mattress utilized     Problem: Mechanical Ventilation Invasive  Goal: Optimal Device Function  Intervention: Optimize Device Care and Function  Recent Flowsheet Documentation  Taken 04/06/2024 0400 by Baldwin Katrinka RAMAN, RN  Oral Care: mouth swabbed  Taken 04/06/2024 0000 by Baldwin Katrinka RAMAN, RN  Oral Care: mouth swabbed  Goal: Absence of Ventilator-Induced Lung Injury  Intervention: Prevent Ventilator-Associated Pneumonia  Recent Flowsheet Documentation  Taken 04/06/2024 0400 by Baldwin Katrinka RAMAN, RN  Oral Care: mouth swabbed  Taken 04/06/2024 0000 by Baldwin Katrinka RAMAN, RN  Oral Care: mouth swabbed  Taken 04/05/2024 2000 by Baldwin Katrinka RAMAN, RN  Head of Bed [facility]) Positioning: HOB at 30-45 degrees     Problem: Mechanical Ventilation Invasive  Goal: Optimal Device Function  Intervention: Optimize Device Care and Function  Recent Flowsheet Documentation  Taken 04/06/2024 0400 by Baldwin Katrinka RAMAN, RN  Oral Care: mouth swabbed  Taken 04/06/2024 0000 by Baldwin Katrinka RAMAN, RN  Oral Care: mouth swabbed  Goal: Absence of Ventilator-Induced Lung Injury  Intervention: Prevent Ventilator-Associated Pneumonia  Recent Flowsheet Documentation  Taken 04/06/2024 0400 by Baldwin Katrinka RAMAN, RN  Oral Care: mouth swabbed  Taken 04/06/2024 0000 by Baldwin Katrinka RAMAN, RN  Oral Care: mouth swabbed  Taken 04/05/2024 2000 by Baldwin Katrinka RAMAN, RN  Head of Bed [facility]) Positioning: HOB at 30-45 degrees     Problem: Mechanical Ventilation Invasive  Goal: Optimal Device Function  Intervention: Optimize Device Care and Function  Recent Flowsheet Documentation  Taken 04/06/2024 0400 by Baldwin Katrinka RAMAN, RN  Oral Care: mouth swabbed  Taken 04/06/2024 0000 by Baldwin Katrinka RAMAN, RN  Oral Care: mouth swabbed  Goal: Absence of Ventilator-Induced Lung Injury  Intervention: Prevent Ventilator-Associated Pneumonia  Recent Flowsheet Documentation  Taken 04/06/2024 0400 by Baldwin Katrinka RAMAN, RN  Oral Care: mouth swabbed  Taken 04/06/2024 0000 by Baldwin Katrinka RAMAN, RN  Oral Care: mouth swabbed  Taken 04/05/2024 2000 by Baldwin Katrinka RAMAN, RN  Head of Bed [facility]) Positioning: HOB at 30-45 degrees     Problem: Mechanical Ventilation Invasive  Goal: Optimal Device Function  Intervention: Optimize Device Care and Function  Recent Flowsheet Documentation  Taken 04/06/2024 0400 by Baldwin Katrinka RAMAN, RN  Oral Care: mouth swabbed  Taken 04/06/2024 0000 by Baldwin Katrinka RAMAN, RN  Oral Care: mouth swabbed  Goal: Absence of Ventilator-Induced Lung Injury  Intervention: Prevent Ventilator-Associated Pneumonia  Recent Flowsheet Documentation  Taken 04/06/2024 0400 by Baldwin Katrinka RAMAN, RN  Oral Care: mouth swabbed  Taken 04/06/2024 0000 by Baldwin Katrinka RAMAN, RN  Oral Care: mouth swabbed  Taken 04/05/2024 2000 by Baldwin Katrinka RAMAN, RN  Head of Bed [facility]) Positioning: HOB at 30-45 degrees     Problem: Mechanical Ventilation Invasive  Goal: Optimal Device Function  Intervention: Optimize Device Care and Function  Recent Flowsheet Documentation  Taken 04/06/2024 0400 by Baldwin Katrinka RAMAN, RN  Oral Care: mouth swabbed  Taken 04/06/2024 0000 by Baldwin Katrinka RAMAN, RN  Oral Care: mouth swabbed  Goal: Absence of Ventilator-Induced Lung Injury  Intervention: Prevent Ventilator-Associated Pneumonia  Recent Flowsheet Documentation  Taken 04/06/2024 0400 by Baldwin Katrinka RAMAN, RN  Oral Care: mouth swabbed  Taken 04/06/2024 0000 by Baldwin Katrinka RAMAN, RN  Oral Care: mouth swabbed  Taken 04/05/2024 2000 by Baldwin Katrinka RAMAN, RN  Head of Bed [facility]) Positioning: HOB at 30-45 degrees

## 2024-04-06 NOTE — Advance Care Planning (Signed)
 ADVANCE CARE PLANNING NOTE    Discussion Date:  April 06, 2024    Patient has decisional capacity:  No    Patient has selected a Health Care Decision-Maker if loses capacity: No    Health Care Decision Maker as of 04/06/2024    HCDM (patient stated preference): Branam,[name]    HCDM, First AlternateJohnita, Palleschi Daughter - [phone]    Discussion Participants:  [name] Ezzard, daughter  Carolyn Deed, MD  Beryl Pride, MD    Communication of Medical Status/Prognosis:   Discussed complicated hospital course with daughter at bedside, including cardiac arrest x1, intubation x3.  Discussed with daughter her mother's prolonged course with critical illness.  Discussed ongoing encephalopathy thought at this time to be largely attributed to ICU delirium.  Patient is conversant, though continues to be delirious.  Discussed possible long-term sequelae of persistent hospital-acquired delirium and the potential for persistent cognitive dysfunction given neurologic insult, and the fact that we have no way to predict where Mrs. Cantu' mental status may improve to.     Also discussed that patient self extubated this morning, however is high risk for repeat intubation.  Discussed that the next intubation may result in placement of tracheostomy tube rather than aiming for extubation.    [name] was clear that her mother values avoiding long-term dependence on mechanical support, however she believes that if there is a chance for extubation or tracheostomy wean following recurrent respiratory failure, her mother would wish to proceed with reintubation.    We also discussed cardiopulmonary resuscitation.  We discussed that while Mrs. Diggins has had a cardiac arrest during this admission, she has had a prolonged illness course following this event, and were she to suffer another cardiac arrest and be successfully resuscitated, given her prolonged illness course, she is at very high risk of suffering a poor outcome even if ROSC is obtained.  Her daughter reiterated that Mrs. Lanphear would not want to be dependent on mechanical support.  [name] is agreeable to discussing CODE STATUS in the event of a cardiac arrest with her sister and circle back with the team for further goals of care conversations.    Discussed the value of palliative care consultation in the setting of patients with prolonged critical illness and the value of longitudinal care with this team.  HCDM amenable to palliative care consult.    Treatment Decisions:    - Remain full code at this time   - Patient's sisters to discuss appropriateness of chest compressions should Mrs. Varricchio suffer repeat cardiac arrest   -Palliative care consult in the morning          I spent 30 minutes providing voluntary advance care planning services for this patient.    Duvall Comes C. Pride, MD  PGY-2, Internal Medicine

## 2024-04-06 NOTE — Progress Notes (Signed)
 MICU Daily Progress Note     Date of Service: 04/06/2024    Problem List:   Principal Problem:    Bacteremia, escherichia coli  Active Problems:    Alcoholic liver disease (HHS-HCC)    HTN (hypertension)    Depression with anxiety    Class 2 severe obesity with serious comorbidity in adult    Atrial fibrillation    (CMS-HCC)    Pleural effusion    Hypernatremia    Thrombocytopenia    Cirrhosis    (CMS-HCC)    A-fib (CMS-HCC)    Hepatic encephalopathy    (CMS-HCC)      Interval history: [name] is a 78 y.o. female with HFpEF, HTN, Afib on AC, MASLD cirrhosis, originally hospitalized for scheduled TEE/DCCV (aborted d/t LAA clot) c/b AHHRF, encephalopathy and septic shock requiring MICU care, was s/p extubation transferred to [facility] for further management of anticoagulation and hypernatremia. Transferred back to [facility] MICU for closer monitoring for respiratory status, pressor requirement, GI bleed, and management of carotid artery injury.     On 10/24, patient had acute worsening of respiratory status leading requiring intubation. When patient was given sedation for intubation, she coded and promptly had ROSC after a few minutes of CPR. Subsequently, CVC placed in neck, used to draw blood, unclear if given pressors through it, then found  to be in arterial system based on blood gas and imaging, likely right carotid. Line was subsequently removed, and pressure was held for 15 minutes, hemostasis with no hematoma. She was transferred to [facility] MICU for management of this carotid injury, to be evaluated by vascular surgery while also managing her increased vasopressor requirement and respiratory distress.  Has been weaned off pressors since 10/27 at 1 PM. VBG's indicating that she is ready for extubation, however her mentation suggests that she is not able to protect her airway.  She was extubated on 10/31, and mentation slowly improved.  Unfortunately on the morning of 11/2 she had increased lethargy, increased secretions, and she was not protecting her airway.  She was reintubated on 11/2, and required pressor support with norepinephrine and vasopressin after the induction agents.  Self extubated on 11/5.    24 hour events:  - Continue lactulose  and rifaximin    - Repeat diuresing with Lasix iso fluid overload, responded well to Lasix 80 yesterday  - Self extubated early this morning. Based on the discussion with the daughter, opted for aggressive measures including reintubation if necessary  - Responded well to Lasix 80, re-diuresing today     Neurological   Hepatic encephalopathy  Per chart review at [facility], rapid response called on 10/24 for increased somnolence. At the time, differential included hepatic encephalopathy, toxic metabolic encephalopathy in the setting of shock (possibly sepsis), versus intracranial pathology. She was subsequently intubated. CT head on 10/24 showed no acute intracranial abnormality. Weaned off sedation with slow improvements in neurological status.  Plan to extubate when mental status improves.  - Continue lactulose , target 3-5 BMs daily.  MRI brain was impeded due to movement, however with we saw was unremarkable.  - Continue lactulose  and rifaximin      Analgesia: No pain issues  RASS at goal? N/A, not on sedation  [place] Agitation Assessment Scale (RASS) : 0 (04/06/2024  6:00 AM)    Pulmonary   Acute Hypoxic Hypercarbic Respiratory Failure   Rapid response called on 10/24 for increased somnolence, found to have acute hypercarbic respiratory failure and subsequently intubated. Suspect hypercarbic respiratory failure due to  encephalopathy as above. CXR on 10/24 showed worsening pleural effusions. Not currently getting diuresed. Given patient's hypoxia, and AMS, there was concern for infectious etiology. MRSA nares negative. Infectious workup thus far is unremarkable. SBTs show readiness extubation based on VBGs, however there is concern that she continues to be unable to protect her airway due to her current mentation.  Reintubated on 11/2 followed by therapeutic bronchoscopy.  Self extubated on 11/5.  - Currently on high flow nasal cannula      Cardiovascular   Carotid Artery Injury (resolved)  At [facility], CVC placed intended for RIJ on 10/24 following intubation, PEA, ROSC. Was used with known blood return. Unclear if it was used for pressor support, but highly likely that it was. CXR for placement check showed concern for intra aterial location, and blood gas confirmed it was arterial. Thought to be in carotid artery. CVC removed with pressure held for 15 minutes, with no hematoma formation. On arrival, no physical exam and bedside ultrasound showed no large hematoma. No active bleeding.   - Vascular Surgery consulted, stated fistula has healed based on Ultrasound findings    PEA arrest s/p ROSC  Hypovolemic/Hemorrhagic shock (resolved)  Atrial fibrillation  known left atrial appendage clot  HFpEF  Patient with PEA arrest peri-intubation with ROSC. Shock thought to be hemorrhagic in the setting of GI bleed History of atrial fibrillation with known LAA clot and HFpEF. Vasopressors were weaned off 10/27. APTT was elevated the morning of 10/28 necessitating changing anticoagulation from therapeutic heparin to therapeutic Lovenox .   - Holding Coreg   - Continue home Eliquis for anticoagulation for LAA clot    Renal   Hypernatremia (resolved)  At [facility], had persistently hypernatremia near 153. Had been treated with D5 gtt. Sodium throughout admission has largely been 146-148. Sodium was 151 morning of 10/28, presume this will decrease once we start tube feeds.  Sodium has overall normalized and continues to be within normal limits.  - Strict I/O's  - Sodium check q6hours   - FWF with TF, adjusting as needed    Infectious Disease/Autoimmune   E. Coli Bacteremia (resolved),   Initially admitted for afib, found to have E. coli bacteremia on October 10. This was treated with cefazolin . Rapid response called 10/24, patient noted to be hypothermic. WBCs jumped from 3.0 to 9.7 on 10/24. Although the most recent lab was following intubation and CPR. UA with pyuria, rare bacteria, hematuria. Peri-intubation, patient developed significant hypotension requiring initiation of NE and vasopressin. Suspect possible septic shock with unknown infectious source. Patient started on vancomycin and cefepime 10/24. CT Abdomen pelvis concerning for aspiration pneumonia. Following infectious workup was unremarkable. Antibiotics were stopped on 10/27.  Infectious workup was repeated on 11/3 due to reintubation and status post bronchoscopy.  - follow blood, urine cultures to final, so far no growth to date    Cultures:  Blood Culture, Routine (no units)   Date Value   04/04/2024 No Growth at 24 hours   04/04/2024 No Growth at 24 hours     Urine Culture, Comprehensive (no units)   Date Value   04/04/2024 NO GROWTH   03/25/2024 <10,000 CFU/mL     Lower Respiratory Culture (no units)   Date Value   04/04/2024 TOO YOUNG TO READ   03/29/2024 Specimen Not Processed     WBC (10*9/L)   Date Value   04/06/2024 4.9     WBC, UA (/HPF)   Date Value   03/25/2024 25 (H)  FEN/GI   Sigmoid Mass c/f Malignancy and Hematochezia (resolved)  Hemorrhagic Shock (resolved)  CT abdomen pelvis done 10/24 with evidence of cirrhosis and masslike thickening of the lower sigmoid colon concerning for malignancy.  CTA abdomen pelvis performed 1024 with active extravasation into the gastric fundus possibly secondary to traumatic enteric tube placement.  GI scoped the patient on 10/25 and clipped an area of active bleeding.  They removed a total of 1.5 L of blood.  Hemoglobin 10/20 6 AM downtrended to 6.0, so 2 units of blood transfused.  GI stated this was consistent with 10/25 scope and likely did not represent increased bleeding. Hb steadily improving with Hb of 9.2 on 10/28.   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 Given shock of unclear etiology. CTAP demonstrating cirrhosis. Likely that cirrhosis is contributing to hypotension.   - Continue lactulose ; target 3-5 BMs daily  - NGT in place, on TF and FWF  - Daily MELD labs and CMP     MELD 3.0: 15 at 04/05/2024  4:31 AM  MELD-Na: 13 at 04/05/2024  4:31 AM  Calculated from:  Serum Creatinine: 0.43 mg/dL (Using min of 1 mg/dL) at 88/09/7972  5:68 AM  Serum Sodium: 143 mmol/L (Using max of 137 mmol/L) at 04/05/2024  4:31 AM  Total Bilirubin: 1.3 mg/dL at 88/09/7972  5:68 AM  Serum Albumin: 2.4 g/dL at 88/09/7972  5:68 AM  INR(ratio): 1.61 at 04/03/2024 10:26 AM  Age at listing (hypothetical): 78 years  Sex: Female at 04/05/2024  4:31 AM    Provider Malnutrition Assessment:  Body mass index is 39.22 kg/m??. BMI Interpretation: >/= 30 and < 40, consistent with obesity, clinically significant requiring additional resources and complicating multiple aspects of patient care.  GLIM criteria:   Pt does not meet criteria  -I have screened this patient for malnutrition and they did NOT meet criteria for malnutrition based on GLIM criteria.  -TF and FWF; Dietician consulted, appreciate assistance    Heme/Coag   Acute blood loss anemia  Hemoglobin down trended to 6.0 on the morning of 10/26 thought to be secondary to acute GI bleed that was scoped and clipped 10/25. Patient received 2 units PRBC 10/26 and an additional unit 10/27. Hb steadily improving with Hb of 9.2 on 10/28.   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 Reason: Not indicated.    Prophylaxis/LDA/Restraints/Consults   ICU Checklist completed: yes (see ICU rounding navigator in Epic)    Patient Lines/Drains/Airways Status       Active Active Lines, Drains, & Airways       Name Placement date Placement time Site Days    ETT  7.5 04/03/24  0702  -- 3    CVC Triple Lumen 04/03/24 Left Internal jugular 04/03/24  2037  Internal jugular  2    NG/OG Tube Feedings 10 Fr. Right nostril 04/01/24  1138  Right nostril  4    Urethral Catheter Temperature probe 16 Fr. 04/03/24  0830  Temperature probe  3    Peripheral IV 03/20/24 Anterior;Left;Upper Arm 03/20/24  1730  Arm  16    Arterial Line 04/03/24 Right Radial 04/03/24  0830  Radial  3                  Patient Lines/Drains/Airways Status       Active Wounds       Name Placement date Placement time Site Days    Wound 03/01/24 Other (Comment) Toe (Comment which one) Left;Other (Comment) Blister present on arrival, tx already in outpatient setting, in process of healing, surrounding erythema 03/01/24  0129  Toe (Comment which one)  36    Wound 03/13/24 Irritant Contact Dermatitis Incontinence Sacrum Mid gluteal cleft MASD? 03/13/24  --  Sacrum  24                  Goals of Care     Code Status:   Orders Placed This Encounter   Procedures    Full Code     Standing Status:   Standing     Number of Occurrences:   1        Designated Healthcare Decision Maker:  Ms. Mcgloin designated healthcare decision maker(s) is/are   HCDM (patient stated preference): Guettler,[name] - Spouse - [phone]    HCDM, First AlternateMavery, [place] - Daughter - [phone]. See HCDM section of Epic sidebar/storyboard or ACP tab in patient chart for details regarding active HCDMs and patient capacity for decision-making.      Subjective     Patient seen shortly after self extubation, was following commands and speaking.    Objective     Vitals - past 24 hours  Pulse:  [90-127] 127  SpO2 Pulse:  [90-126] 110  Resp:  [9-34] 34  FiO2 (%):  [30 %-60 %] 50 %  SpO2:  [97 %-100 %] 100 % Intake/Output  I/O last 3 completed shifts:  In: 2380.5 [NG/GT:2020; IV Piggyback:360.5]  Out: 4685 [Urine:2285; Emesis/NG output:600; Stool:1800]     Physical Exam:    General: Drowsy, responding to yes/no questions  HEENT: normocephalic, atraumatic   CV: pulses palpable, regular borderline tachycardia   Pulm: Rhonchi w transmitted upper airway sounds throughout  GI: soft, NTND, + BS  MSK: 3+ pitting edema noted to her knees bilaterally, no clubbing or cyanosis  Skin: numerous large flat violaceous ecchymosis on left arm, chest, neck bilaterally.   Neuro: Withdrawals to painful stimuli. Responding to name and reliably following commands.     Continuous Infusions:   Infusions Meds[1]    Scheduled Medications:   Scheduled Medications[2]    PRN medications:  PRN Medications[3]    Data/Imaging Review: Reviewed in Epic and personally interpreted on 04/06/2024. See EMR for detailed results.        Deshanna Kama S Kymberlee Viger, MD  PGY1 Internal Medicine, [facility]                     [1]    NORepinephrine bitartrate-NS Stopped (04/04/24 1600)    vasopressin Stopped (04/03/24 0715)   [2]    apixaban  5 mg Enteral tube: gastric BID    cyanocobalamin (vitamin B-12)  1,000 mcg Enteral tube: gastric Daily    esomeprazole  40 mg Enteral tube: gastric Daily    flu vac 2025 [id number](PF)  0.5 mL Intramuscular During hospitalization    folic acid   1 mg Enteral tube: gastric Daily    lactulose   40 g Enteral tube: gastric QID    multivitamins (ADULT)  1 tablet Enteral tube: gastric Daily  potassium phosphate 20 mmol in sodium chloride  (NS) 0.9 % 500 mL infusion  20 mmol Intravenous Once    rifAXIMin   550 mg Enteral tube: gastric BID    thiamine  mononitrate (vit B1)  100 mg Enteral tube: gastric Daily   [3] acetaminophen , docusate sodium, fentaNYL  (PF) **OR** fentaNYL  (PF)

## 2024-04-06 NOTE — Procedures (Signed)
 PICC LINE INSERTION PROCEDURE NOTE    Indications:  Poor Vascular Access    Consent/Time Out:    Risks, benefits and alternatives discussed with daughter.  Phone Consent , Verbal Consent, and Medically necessary Consent was obtained prior to the procedure and is detailed in the medical record.  Prior to the start of the procedure, a time out was taken and the identity of the patient was confirmed via name, medical record number and  date of birth.  Patient/authorized representative was given the opportunity to ask questions.  Questions were answered.  The availability of the correct equipment was verified.    Procedure Details:  The vein was identified and measured for appropriate catheter length. Maximum sterile techniques including PPE were utilized per protocol.  Sterile field prepared with necessary supplies and equipment. Insertion site was prepped with chlorhexidine solution and allowed to dry. Lidocaine  2 mL subcutaneously and intradermally administered to insertion site.  The catheter was primed with normal saline.  A 4 FR Double lumen was inserted to the L Basilic vein with 1 insertion attempt(s).  Catheter aspirated, 6 mL blood return present.  The catheter was then flushed with 20 mL of normal saline.   Insertion site cleansed, and sterile dressing applied per manufacturer guidelines. The Central Line Checklist was referenced.  The catheter was inserted without difficulty by David Karabin, RN and assisted by Gaspare Netzel, RN.    Findings:  Manufacturer:  Bard  Lot #:  [id number]  CT Injectable (power):  Yes  Arm Circumference:  36  cm  Total catheter length:  40 cm.    External length:  0 cm.  Catheter trimmed:  yes  Port reserved:  No    Vein Size:   compressible:  Yes.  Vein measurement:  See image       Tip Placement Verification:   CXR    Workup Time:    90 minutes    Recommendations:  PICC brochure given to patient with teaching instructions  Refer to head of bed sign      See vein image in PACs.

## 2024-04-06 NOTE — Procedures (Signed)
 PHONE CONSENT WITNESS NOTE    Phone consent was obtained prior to PICC procedure.    Interpreter was not utilized (English speaking patient).    Patients authorized representative:  daughter, [name]    Patient's authorized representative was educated regarding Peripherally inserted central catheters, risks and benefits and bedside procedure.  Designee voiced understanding, All questions were answered to voiced understanding, Designee denied any further questions, and Designee agreed to consent for this procedure.    Phone consent was witnessed by 2 VAT members:  Jamari Diana Leonce, [place] & David Karabin, RN at 1505 PM    Thank you,     Nieshia Larmon Leonce, [place] Venous Access Team [phone]       Workup Time:  15 minutes

## 2024-04-06 NOTE — Procedures (Signed)
 Spoke with [place] radiologist read the xray and stated that it was located in the mid svc.

## 2024-04-06 NOTE — Plan of Care (Signed)
 Patient did well overnight on the ventilator remaining on PSV without any issues. Dropping PS was attempted this morning to try to get a formal SBT on the patient but was unsuccessful. The patient was able to maintain tidal volumes at a PS of 12 for a short time before she became uncomfortable and had a RR in the 30's. Airway remains patent and supplies is at bedside.     Problem: Mechanical Ventilation Invasive  Goal: Effective Communication  Outcome: Ongoing - Unchanged  Goal: Optimal Device Function  Outcome: Ongoing - Unchanged  Intervention: Optimize Device Care and Function  Recent Flowsheet Documentation  Taken 04/06/2024 0204 by Canary Boer, RRT  Airway/Ventilation Management:   airway patency maintained   humidification applied   pulmonary hygiene promoted  Oral Care: suction provided  Taken 04/05/2024 2051 by Canary Boer, RRT  Airway/Ventilation Management:   airway patency maintained   humidification applied   pulmonary hygiene promoted  Oral Care: suction provided  Goal: Mechanical Ventilation Liberation  Outcome: Ongoing - Unchanged  Goal: Optimal Nutrition Delivery  Outcome: Ongoing - Unchanged  Goal: Absence of Device-Related Skin and Tissue Injury  Outcome: Ongoing - Unchanged  Goal: Absence of Ventilator-Induced Lung Injury  Outcome: Ongoing - Unchanged  Intervention: Prevent Ventilator-Associated Pneumonia  Recent Flowsheet Documentation  Taken 04/06/2024 0204 by Canary Boer, RRT  Head of Bed [facility]) Positioning: HOB elevated  VAP Prevention Bundle:   HOB elevation maintained   vent circuit breaks minimized  Oral Care: suction provided  Taken 04/05/2024 2051 by Canary Boer, RRT  Head of Bed [facility]) Positioning: HOB elevated  VAP Prevention Bundle:   HOB elevation maintained   vent circuit breaks minimized  Oral Care: suction provided

## 2024-04-07 LAB — BASIC METABOLIC PANEL
ANION GAP: 8 mmol/L (ref 5–14)
BLOOD UREA NITROGEN: 13 mg/dL (ref 9–23)
BUN / CREAT RATIO: 28
CALCIUM: 8.1 mg/dL — ABNORMAL LOW (ref 8.7–10.4)
CHLORIDE: 104 mmol/L (ref 98–107)
CO2: 33 mmol/L — ABNORMAL HIGH (ref 20.0–31.0)
CREATININE: 0.47 mg/dL — ABNORMAL LOW (ref 0.55–1.02)
EGFR CKD-EPI (2021) FEMALE: >90 mL/min/1.73m2 (ref >=60)
GLUCOSE RANDOM: 107 mg/dL — ABNORMAL HIGH (ref 70–99)
POTASSIUM: 3.8 mmol/L (ref 3.4–4.8)
SODIUM: 145 mmol/L (ref 135–145)

## 2024-04-07 LAB — BLOOD GAS CRITICAL CARE PANEL, ARTERIAL
BASE EXCESS ARTERIAL: 6.2 — ABNORMAL HIGH (ref -2.0–2.0)
BASE EXCESS ARTERIAL: 2.3 — ABNORMAL HIGH (ref -2.0–2.0)
BASE EXCESS ARTERIAL: 4.6 — ABNORMAL HIGH (ref -2.0–2.0)
CALCIUM IONIZED ARTERIAL (MG/DL): 4.85 mg/dL (ref 4.40–5.40)
CALCIUM IONIZED ARTERIAL (MG/DL): 4.75 mg/dL (ref 4.40–5.40)
CALCIUM IONIZED ARTERIAL (MG/DL): 4.57 mg/dL (ref 4.40–5.40)
CARBOXYHEMOGLOBIN: 2.2 % — ABNORMAL HIGH (ref <1.2)
CARBOXYHEMOGLOBIN: 2.1 % — ABNORMAL HIGH (ref <1.2)
CARBOXYHEMOGLOBIN: 2.4 % — ABNORMAL HIGH (ref <1.2)
CHLORIDE, WHOLE BLOOD: 110 mmol/L — ABNORMAL HIGH (ref 98–107)
CHLORIDE, WHOLE BLOOD: 114 mmol/L — ABNORMAL HIGH (ref 98–107)
CHLORIDE, WHOLE BLOOD: 112 mmol/L — ABNORMAL HIGH (ref 98–107)
GLUCOSE WHOLE BLOOD: 118 mg/dL (ref 70–179)
GLUCOSE WHOLE BLOOD: 146 mg/dL (ref 70–179)
GLUCOSE WHOLE BLOOD: 92 mg/dL (ref 70–179)
HCO3 ARTERIAL: 31 mmol/L — ABNORMAL HIGH (ref 22–27)
HCO3 ARTERIAL: 29 mmol/L — ABNORMAL HIGH (ref 22–27)
HCO3 ARTERIAL: 28 mmol/L — ABNORMAL HIGH (ref 22–27)
HEMOGLOBIN BLOOD GAS: 8.1 g/dL — ABNORMAL LOW (ref 12.00–16.00)
HEMOGLOBIN BLOOD GAS: 11.2 g/dL — ABNORMAL LOW (ref 12.00–16.00)
HEMOGLOBIN BLOOD GAS: 7.4 g/dL — ABNORMAL LOW (ref 12.00–16.00)
LACTATE BLOOD ARTERIAL: 1.1 mmol/L (ref <1.3)
LACTATE BLOOD ARTERIAL: 1.4 mmol/L — ABNORMAL HIGH (ref <1.3)
LACTATE BLOOD ARTERIAL: 1.9 mmol/L — ABNORMAL HIGH (ref <1.3)
METHEMOGLOBIN: <1 % (ref <1.5)
METHEMOGLOBIN: <1 % (ref <1.5)
METHEMOGLOBIN: <1 % (ref <1.5)
O2 SATURATION ARTERIAL: 99.2 % (ref 94.0–100.0)
O2 SATURATION ARTERIAL: 97.2 % (ref 94.0–100.0)
O2 SATURATION ARTERIAL: 99.6 % (ref 94.0–100.0)
OXYHEMOGLOBIN: 96.7 % (ref 94.0–100.0)
OXYHEMOGLOBIN: 94.9 % (ref 94.0–100.0)
OXYHEMOGLOBIN: 96.5 % (ref 94.0–100.0)
PCO2 ARTERIAL: 56.4 mmHg — ABNORMAL HIGH (ref 35.0–45.0)
PCO2 ARTERIAL: 65.4 mmHg (ref 35.0–45.0)
PCO2 ARTERIAL: 40 mmHg (ref 35.0–45.0)
PH ARTERIAL: 7.36 (ref 7.35–7.45)
PH ARTERIAL: 7.27 — ABNORMAL LOW (ref 7.35–7.45)
PH ARTERIAL: 7.46 — ABNORMAL HIGH (ref 7.35–7.45)
PO2 ARTERIAL: 117 mmHg — ABNORMAL HIGH (ref 80.0–110.0)
PO2 ARTERIAL: 100 mmHg (ref 80.0–110.0)
PO2 ARTERIAL: 113 mmHg — ABNORMAL HIGH (ref 80.0–110.0)
POTASSIUM WHOLE BLOOD: 3.9 mmol/L (ref 3.4–4.6)
POTASSIUM WHOLE BLOOD: 3.9 mmol/L (ref 3.4–4.6)
POTASSIUM WHOLE BLOOD: 3.1 mmol/L — ABNORMAL LOW (ref 3.4–4.6)
SODIUM WHOLE BLOOD: 145 mmol/L (ref 135–145)
SODIUM WHOLE BLOOD: 144 mmol/L (ref 135–145)
SODIUM WHOLE BLOOD: 146 mmol/L — ABNORMAL HIGH (ref 135–145)

## 2024-04-07 LAB — CBC W/ AUTO DIFF
BASOPHILS ABSOLUTE COUNT: 0 10*9/L (ref 0.0–0.1)
BASOPHILS RELATIVE PERCENT: 0.8 %
EOSINOPHILS ABSOLUTE COUNT: 0.1 10*9/L (ref 0.0–0.5)
EOSINOPHILS RELATIVE PERCENT: 1.8 %
HEMATOCRIT: 25.2 % — ABNORMAL LOW (ref 34.0–44.0)
HEMOGLOBIN: 8.3 g/dL — ABNORMAL LOW (ref 11.3–14.9)
LYMPHOCYTES ABSOLUTE COUNT: 0.5 10*9/L — ABNORMAL LOW (ref 1.1–3.6)
LYMPHOCYTES RELATIVE PERCENT: 9.3 %
MEAN CORPUSCULAR HEMOGLOBIN CONC: 33 g/dL (ref 32.0–36.0)
MEAN CORPUSCULAR HEMOGLOBIN: 32.8 pg — ABNORMAL HIGH (ref 25.9–32.4)
MEAN CORPUSCULAR VOLUME: 99.4 fL — ABNORMAL HIGH (ref 77.6–95.7)
MEAN PLATELET VOLUME: 8.2 fL (ref 6.8–10.7)
MONOCYTES ABSOLUTE COUNT: 0.7 10*9/L (ref 0.3–0.8)
MONOCYTES RELATIVE PERCENT: 13.8 %
NEUTROPHILS ABSOLUTE COUNT: 4 10*9/L (ref 1.8–7.8)
NEUTROPHILS RELATIVE PERCENT: 74.3 %
PLATELET COUNT: 126 10*9/L — ABNORMAL LOW (ref 150–450)
RED BLOOD CELL COUNT: 2.54 10*12/L — ABNORMAL LOW (ref 3.95–5.13)
RED CELL DISTRIBUTION WIDTH: 24.1 % — ABNORMAL HIGH (ref 12.2–15.2)
WBC ADJUSTED: 5.4 10*9/L (ref 3.6–11.2)

## 2024-04-07 LAB — HEPATIC FUNCTION PANEL
ALBUMIN: 2.3 g/dL — ABNORMAL LOW (ref 3.4–5.0)
ALKALINE PHOSPHATASE: 134 U/L — ABNORMAL HIGH (ref 46–116)
ALT (SGPT): 20 U/L (ref 10–49)
AST (SGOT): 36 U/L — ABNORMAL HIGH (ref <=34)
BILIRUBIN DIRECT: 0.8 mg/dL — ABNORMAL HIGH (ref 0.00–0.30)
BILIRUBIN TOTAL: 1.4 mg/dL — ABNORMAL HIGH (ref 0.3–1.2)
PROTEIN TOTAL: 5 g/dL — ABNORMAL LOW (ref 5.7–8.2)

## 2024-04-07 LAB — PHOSPHORUS: PHOSPHORUS: 3.3 mg/dL (ref 2.4–5.1)

## 2024-04-07 LAB — CYSTATIN C
CYSTATIN C: 1.39 mg/L — ABNORMAL HIGH (ref 0.64–1.23)
EGFR CKD-EPI (2012) CYSTATIN C FEMALE: 44 mL/min/1.73m2 — ABNORMAL LOW (ref >=60)

## 2024-04-07 LAB — MAGNESIUM: MAGNESIUM: 2 mg/dL (ref 1.6–2.6)

## 2024-04-07 MED ADMIN — sodium chloride (NS) 0.9 % flush 10 mL: 10 mL | INTRAVENOUS | @ 19:00:00

## 2024-04-07 MED ADMIN — sodium chloride (NS) 0.9 % flush 10 mL: 10 mL | INTRAVENOUS | @ 09:00:00

## 2024-04-07 MED ADMIN — rifAXIMin (XIFAXAN) oral suspension: 550 mg | GASTROENTERAL | @ 15:00:00 | Stop: 2024-04-12

## 2024-04-07 MED ADMIN — rifAXIMin (XIFAXAN) oral suspension: 550 mg | GASTROENTERAL | @ 02:00:00 | Stop: 2024-04-12

## 2024-04-07 MED ADMIN — folic acid (FOLVITE) tablet 1 mg: 1 mg | GASTROENTERAL | @ 15:00:00

## 2024-04-07 MED ADMIN — thiamine mononitrate (vit B1) tablet 100 mg: 100 mg | GASTROENTERAL | @ 15:00:00

## 2024-04-07 MED ADMIN — cyanocobalamin (vitamin B-12) tablet 1,000 mcg: 1000 ug | GASTROENTERAL | @ 15:00:00

## 2024-04-07 MED ADMIN — lactulose oral solution: 30 g | GASTROENTERAL | @ 15:00:00

## 2024-04-07 MED ADMIN — lactulose oral solution: 30 g | GASTROENTERAL | @ 19:00:00

## 2024-04-07 MED ADMIN — lactulose oral solution: 30 g | GASTROENTERAL | @ 02:00:00

## 2024-04-07 MED ADMIN — esomeprazole (NEXIUM) granules 40 mg: 40 mg | GASTROENTERAL | @ 15:00:00

## 2024-04-07 MED ADMIN — multivitamins, therapeutic with minerals tablet 1 tablet: 1 | GASTROENTERAL | @ 15:00:00

## 2024-04-07 MED ADMIN — apixaban (ELIQUIS) tablet 5 mg: 5 mg | GASTROENTERAL | @ 15:00:00

## 2024-04-07 MED ADMIN — apixaban (ELIQUIS) tablet 5 mg: 5 mg | GASTROENTERAL | @ 02:00:00

## 2024-04-07 MED ADMIN — phenylephrine 1 mg/10 mL (100 mcg/mL) injection Syrg: INTRAVENOUS | @ 18:00:00 | Stop: 2024-04-07

## 2024-04-07 MED ADMIN — Propofol (DIPRIVAN) injection: INTRAVENOUS | @ 18:00:00 | Stop: 2024-04-07

## 2024-04-07 MED ADMIN — propofol (DIPRIVAN) infusion 10 mg/mL: 0-50 ug/kg/min | INTRAVENOUS | @ 19:00:00

## 2024-04-07 MED ADMIN — ROCuronium (ZEMURON) injection: INTRAVENOUS | @ 18:00:00 | Stop: 2024-04-07

## 2024-04-07 MED ADMIN — potassium chloride (KLOR-CON) packet 20 mEq: 20 meq | GASTROENTERAL | @ 15:00:00 | Stop: 2024-04-07

## 2024-04-07 MED ADMIN — fentaNYL (PF) (SUBLIMAZE) injection 25 mcg: 25 ug | INTRAVENOUS | @ 18:00:00 | Stop: 2024-04-11

## 2024-04-07 MED ADMIN — Propofol (DIPRIVAN) 10 mg/mL injection: INTRAVENOUS | @ 19:00:00 | Stop: 2024-04-07

## 2024-04-07 MED ADMIN — magnesium sulfate 2gm/50mL IVPB: 2 g | INTRAVENOUS | @ 03:00:00 | Stop: 2024-04-06

## 2024-04-07 MED ADMIN — potassium chloride 20 mEq in 100 mL IVPB Premix: 20 meq | INTRAVENOUS | @ 02:00:00 | Stop: 2024-04-06

## 2024-04-07 MED ADMIN — furosemide (LASIX) injection 80 mg: 80 mg | INTRAVENOUS | @ 15:00:00 | Stop: 2024-04-07

## 2024-04-07 MED ADMIN — midazolam (VERSED) injection 2 mg: 2 mg | INTRAVENOUS | @ 18:00:00 | Stop: 2024-04-07

## 2024-04-07 NOTE — Progress Notes (Signed)
 MICU Daily Progress Note     Date of Service: 04/07/2024    Problem List:   Principal Problem:    Bacteremia, escherichia coli  Active Problems:    Alcoholic liver disease (HHS-HCC)    HTN (hypertension)    Depression with anxiety    Class 2 severe obesity with serious comorbidity in adult    Atrial fibrillation    (CMS-HCC)    Pleural effusion    Hypernatremia    Thrombocytopenia    Cirrhosis    (CMS-HCC)    A-fib (CMS-HCC)    Hepatic encephalopathy    (CMS-HCC)      Interval history: [name] is a 78 y.o. female with HFpEF, HTN, Afib on AC, MASLD cirrhosis, originally hospitalized for scheduled TEE/DCCV (aborted d/t LAA clot) c/b AHHRF, encephalopathy and septic shock requiring MICU care, was s/p extubation transferred to [facility] for further management of anticoagulation and hypernatremia. Transferred back to [facility] MICU for closer monitoring for respiratory status, pressor requirement, GI bleed, and management of carotid artery injury.     On 10/24, patient had acute worsening of respiratory status leading requiring intubation. When patient was given sedation for intubation, she coded and promptly had ROSC after a few minutes of CPR. Subsequently, CVC placed in neck, used to draw blood, unclear if given pressors through it, then found  to be in arterial system based on blood gas and imaging, likely right carotid. Line was subsequently removed, and pressure was held for 15 minutes, hemostasis with no hematoma. She was transferred to [facility] MICU for management of this carotid injury, to be evaluated by vascular surgery while also managing her increased vasopressor requirement and respiratory distress.  Has been weaned off pressors since 10/27 at 1 PM. VBG's indicating that she is ready for extubation, however her mentation suggests that she is not able to protect her airway.  She was extubated on 10/31, and mentation slowly improved.  Unfortunately on the morning of 11/2 she had increased lethargy, increased secretions, and she was not protecting her airway.  She was reintubated on 11/2, and required pressor support with norepinephrine and vasopressin after the induction agents.  Self extubated on 11/5.    24 hour events:  - Continue lactulose  and rifaximin    - Repeat diuresing with Lasix iso fluid overload, responded well to Lasix 80 yesterday  - CO2 rising this morning off of the ventilator, reintubated due to hypercarbia and failure to protect airway  - Mental status remains waxing and waning, was very active last night  - Responded well to Lasix 80, re-diuresing today     Neurological   Hepatic encephalopathy  Per chart review at [facility], rapid response called on 10/24 for increased somnolence. At the time, differential included hepatic encephalopathy, toxic metabolic encephalopathy in the setting of shock (possibly sepsis), versus intracranial pathology. She was subsequently intubated. CT head on 10/24 showed no acute intracranial abnormality. Weaned off sedation with slow improvements in neurological status.  While we have continued lactulose  therapy and seeing good response with several bowel movements, her mentation remains poor.  This is concerning for severe ICU delirium.  Meeting was had with daughter, [name], on 11/5.  Described that should intubation be necessary we would likely be moving towards a tracheostomy as [name] has continuously not been protecting her airway.  As of now [name] wanted to pursue aggressive measures including repeat intubation and potential tracheostomy.  However she stated that she will talk it over with her sister and the  rest of her family.  - Continue lactulose , target 3-5 BMs daily.  MRI brain was impeded due to movement, however with we saw was unremarkable.  - Continue lactulose  and rifaximin      Analgesia: No pain issues  RASS at goal? N/A, not on sedation  [place] Agitation Assessment Scale (RASS) : +1 (04/07/2024  4:00 AM)    Pulmonary   Acute Hypoxic Hypercarbic Respiratory Failure   Rapid response called on 10/24 for increased somnolence, found to have acute hypercarbic respiratory failure and subsequently intubated. Suspect hypercarbic respiratory failure due to encephalopathy as above. CXR on 10/24 showed worsening pleural effusions. Not currently getting diuresed. Given patient's hypoxia, and AMS, there was concern for infectious etiology. MRSA nares negative. Infectious workup thus far is unremarkable. SBTs show readiness extubation based on VBGs, however there is concern that she continues to be unable to protect her airway due to her current mentation.  Reintubated on 11/2 followed by therapeutic bronchoscopy.  Self extubated on 11/5.   - Currently on high flow nasal cannula      Cardiovascular   Carotid Artery Injury (resolved)  At [facility], CVC placed intended for RIJ on 10/24 following intubation, PEA, ROSC. Was used with known blood return. Unclear if it was used for pressor support, but highly likely that it was. CXR for placement check showed concern for intra aterial location, and blood gas confirmed it was arterial. Thought to be in carotid artery. CVC removed with pressure held for 15 minutes, with no hematoma formation. On arrival, no physical exam and bedside ultrasound showed no large hematoma. No active bleeding.   - Vascular Surgery consulted, stated fistula has healed based on Ultrasound findings    PEA arrest s/p ROSC  Hypovolemic/Hemorrhagic shock (resolved)  Atrial fibrillation  known left atrial appendage clot  HFpEF  Patient with PEA arrest peri-intubation with ROSC. Shock thought to be hemorrhagic in the setting of GI bleed History of atrial fibrillation with known LAA clot and HFpEF. Vasopressors were weaned off 10/27. APTT was elevated the morning of 10/28 necessitating changing anticoagulation from therapeutic heparin to therapeutic Lovenox .   - Holding Coreg   - Continue home Eliquis for anticoagulation for LAA clot    Renal   Hypernatremia (resolved)  At [facility], had persistently hypernatremia near 153. Had been treated with D5 gtt. Sodium throughout admission has largely been 146-148. Sodium was 151 morning of 10/28, presume this will decrease once we start tube feeds.  Sodium has overall normalized and continues to be within normal limits.  - Strict I/O's  - Sodium check q6hours   - FWF with TF, adjusting as needed    Infectious Disease/Autoimmune   E. Coli Bacteremia (resolved),   Initially admitted for afib, found to have E. coli bacteremia on October 10. This was treated with cefazolin . Rapid response called 10/24, patient noted to be hypothermic. WBCs jumped from 3.0 to 9.7 on 10/24. Although the most recent lab was following intubation and CPR. UA with pyuria, rare bacteria, hematuria. Peri-intubation, patient developed significant hypotension requiring initiation of NE and vasopressin. Suspect possible septic shock with unknown infectious source. Patient started on vancomycin and cefepime 10/24. CT Abdomen pelvis concerning for aspiration pneumonia. Following infectious workup was unremarkable. Antibiotics were stopped on 10/27.  Infectious workup was repeated on 11/3 due to reintubation and status post bronchoscopy.  - follow blood, urine cultures to final, so far no growth to date    Cultures:  Blood Culture, Routine (no units)   Date Value  04/04/2024 No Growth at 48 hours   04/04/2024 No Growth at 48 hours     Urine Culture, Comprehensive (no units)   Date Value   04/04/2024 NO GROWTH   03/25/2024 <10,000 CFU/mL     Lower Respiratory Culture (no units)   Date Value   04/04/2024 OROPHARYNGEAL FLORA ISOLATED   03/29/2024 Specimen Not Processed     WBC (10*9/L)   Date Value   04/07/2024 5.4     WBC, UA (/HPF)   Date Value   03/25/2024 25 (H)       FEN/GI   Sigmoid Mass c/f Malignancy and Hematochezia (resolved)  Hemorrhagic Shock (resolved)  CT abdomen pelvis done 10/24 with evidence of cirrhosis and masslike thickening of the lower sigmoid colon concerning for malignancy.  CTA abdomen pelvis performed 1024 with active extravasation into the gastric fundus possibly secondary to traumatic enteric tube placement.  GI scoped the patient on 10/25 and clipped an area of active bleeding.  They removed a total of 1.5 L of blood.  Hemoglobin 10/20 6 AM downtrended to 6.0, so 2 units of blood transfused.  GI stated this was consistent with 10/25 scope and likely did not represent increased bleeding. Hb steadily improving with Hb of 9.2 on 10/28.   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 Given shock of unclear etiology. CTAP demonstrating cirrhosis. Likely that cirrhosis is contributing to hypotension.   - Continue lactulose ; target 3-5 BMs daily  - NGT in place, on TF and FWF  - Daily MELD labs and CMP     MELD 3.0: 15 at 04/05/2024  4:31 AM  MELD-Na: 13 at 04/05/2024  4:31 AM  Calculated from:  Serum Creatinine: 0.43 mg/dL (Using min of 1 mg/dL) at 88/09/7972  5:68 AM  Serum Sodium: 143 mmol/L (Using max of 137 mmol/L) at 04/05/2024  4:31 AM  Total Bilirubin: 1.3 mg/dL at 88/09/7972  5:68 AM  Serum Albumin: 2.4 g/dL at 88/09/7972  5:68 AM  INR(ratio): 1.61 at 04/03/2024 10:26 AM  Age at listing (hypothetical): 78 years  Sex: Female at 04/05/2024  4:31 AM    Provider Malnutrition Assessment:  Body mass index is 39.22 kg/m??. BMI Interpretation: >/= 30 and < 40, consistent with obesity, clinically significant requiring additional resources and complicating multiple aspects of patient care.  GLIM criteria:   Pt does not meet criteria  -I have screened this patient for malnutrition and they did NOT meet criteria for malnutrition based on GLIM criteria.  -TF and FWF; Dietician consulted, appreciate assistance    Heme/Coag   Acute blood loss anemia  Hemoglobin down trended to 6.0 on the morning of 10/26 thought to be secondary to acute GI bleed that was scoped and clipped 10/25. Patient received 2 units PRBC 10/26 and an additional unit 10/27. Hb steadily improving with Hb of 9.2 on 10/28.   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 Reason: Not indicated.    Prophylaxis/LDA/Restraints/Consults   ICU Checklist completed: yes (see ICU rounding navigator in Epic)    Patient Lines/Drains/Airways Status  Active Active Lines, Drains, & Airways       Name Placement date Placement time Site Days    NG/OG Tube Feedings 10 Fr. Right nostril 04/01/24  1138  Right nostril  5    Urethral Catheter Temperature probe 16 Fr. 04/03/24  0830  Temperature probe  3    PICC Double Lumen 04/06/24 Left Basilic 04/06/24  1558  Basilic  less than 1    Arterial Line 04/03/24 Right Radial 04/03/24  0830  Radial  3                  Patient Lines/Drains/Airways Status       Active Wounds       Name Placement date Placement time Site Days    Wound 03/01/24 Other (Comment) Toe (Comment which one) Left;Other (Comment) Blister present on arrival, tx already in outpatient setting, in process of healing, surrounding erythema 03/01/24  0129  Toe (Comment which one)  37    Wound 03/13/24 Irritant Contact Dermatitis Incontinence Sacrum Mid gluteal cleft MASD? 03/13/24  --  Sacrum  25                  Goals of Care     Code Status:   Orders Placed This Encounter   Procedures    Full Code     Standing Status:   Standing     Number of Occurrences:   1        Designated Healthcare Decision Maker:  Ms. Freet designated healthcare decision maker(s) is/are   HCDM (patient stated preference): Dalpe,[name] - Spouse - [phone]    HCDM, First AlternateArron, Mcnaught - Daughter - [phone]. See HCDM section of Epic sidebar/storyboard or ACP tab in patient chart for details regarding active HCDMs and patient capacity for decision-making.      Subjective     Patient seen shortly after self extubation, was following commands and speaking. Seen before reintubation, was labored breathing and now following commands.    Objective     Vitals - past 24 hours  Temp:  [37.7 ??C (99.9 ??F)-37.9 ??C (100.2 ??F)] 37.7 ??C (99.9 ??F)  Pulse:  [88-130] 98  SpO2 Pulse:  [95-129] 96  Resp:  [19-35] 35  FiO2 (%):  [30 %-50 %] 40 %  SpO2:  [97 %-100 %] 99 % Intake/Output  I/O last 3 completed shifts:  In: 1242.7 [NG/GT:840; IV Piggyback:402.7]  Out: 4745 [Urine:3335; Emesis/NG output:200; Stool:1210]     Physical Exam:    General: Drowsy, responding to yes/no questions  HEENT: normocephalic, atraumatic   CV: pulses palpable, regular borderline tachycardia   Pulm: Rhonchi w transmitted upper airway sounds throughout  GI: soft, NTND, + BS  MSK: improving edema noted to her knees bilaterally, no clubbing or cyanosis  Skin: numerous large flat violaceous ecchymosis on left arm, chest, neck bilaterally.   Neuro: Withdrawals to painful stimuli. Responding to name and reliably following commands.     Continuous Infusions:   Infusions Meds[1]    Scheduled Medications:   Scheduled Medications[2]    PRN medications:  PRN Medications[3]    Data/Imaging Review: Reviewed in Epic and personally interpreted on 04/07/2024. See EMR for detailed results.        Nomie Buchberger S Doyne Micke, MD  PGY1 Internal Medicine, [facility]                       [1] [2]    apixaban  5 mg Enteral tube: gastric  BID    cyanocobalamin (vitamin B-12)  1,000 mcg Enteral tube: gastric Daily    esomeprazole  40 mg Enteral tube: gastric Daily    flu vac 2025 [id number](PF)  0.5 mL Intramuscular During hospitalization    folic acid   1 mg Enteral tube: gastric Daily    lactulose   30 g Enteral tube: gastric TID    multivitamins (ADULT)  1 tablet Enteral tube: gastric Daily    potassium chloride   20 mEq Enteral tube: gastric Once    rifAXIMin   550 mg Enteral tube: gastric BID    sodium chloride   10 mL Intravenous Q8H    sodium chloride   10 mL Intravenous Q8H    thiamine  mononitrate (vit B1)  100 mg Enteral tube: gastric Daily   [3] acetaminophen , docusate sodium, fentaNYL  (PF) **OR** fentaNYL  (PF)

## 2024-04-07 NOTE — Consults (Signed)
 Speech Language Pathology Clinical Swallow Assessment  Evaluation (04/07/24 1134)    Patient Name:  [name]       Medical Record Number: [id number]   Date of Birth: Oct 03, 1945  Sex: Female            SLP Treatment Diagnosis:  (r/o dysphagia)     Activity Tolerance: Patient tolerated treatment well    Assessment  Pt seen for a clinical swallow evaluation. She was alert and oriented to self and place. Pt on 6L [place] with stable vitals however, increased work of breathing at baseline. RN aware and cleared for session. Pt only produced ~1 word per breath. Vocal quality was moderately hoarse with severely decreased vocal volume. Pt followed basic commands and provided contextually appropriate responses throughout. Oral motor evaluation was unremarkable. Oral acceptance and manipulation intact. Pt appeared to have difficulty coordinating breath and swallow. No overt s/sx of aspiration observed with ice chips however, increased work of breathing continued to persist throughout the session. During session provider and RT were notified of WOB by RN. Pt was then transitioned to HFNC.     Pt is at an increased risk of aspiration given frequent intubations, increased work of breathing, and difficulty coordinating breath with swallow therefore, recommend continuing NPO with meds via alternate route. SLP will follow.  Risk for Aspiration: Moderate     Recommendations:  NPO      Follow-up Referral Recommendations : Skilled SLP services to treat dysphagia and address aspiration risk factors    Diet Liquids Recommendations: No Liquids    Diet Solids Recommendation: No Solids    Recommended Form of Medications: Feeding tube        Post Acute Discharge Recommendations  Post Acute SLP Discharge Recommendations: 5x weekly    Prognosis: Fair  Positive Indicators: Prior level of function  Barriers to Discharge: Inaccessible home environment, Decreased safety awareness, Endurance deficits, Cognitive deficits, Inability to safely perform ADLS, Functional strength deficits, Decreased range of motion, Severity of deficits     Plan of Care  SLP Daily Frequency: 1x per day, 2-3 days per week  Planned Treatment Duration : 05/05/24    Treatment Goals:  Short Term Goal 1: Pt will tolerate least restricitve diet without overt s/sx of aspiration   Time Frame : 2 weeks       Planned Interventions: Dysphagia Intervention      Subjective  Medical Updates Since Last Visit/Relevant PMH Affecting Clinical Decision Making: [name] is a 78 y.o. female with HFpEF, HTN, Afib on AC, MASLD cirrhosis, originally hospitalized for scheduled TEE/DCCV (aborted d/t LAA clot) c/b AHHRF, encephalopathy and septic shock requiring MICU care, was s/p extubation transferred to [facility] for further management of anticoagulation and hypernatremia. Transferred back to [facility] MICU for closer monitoring for respiratory status, pressor requirement, GI bleed, and management of carotid artery injury. Pt s/p self extubation on 11/5. Pt currently NPO with corpak.       Communication Preference: Verbal  Patient/Caregiver Reports: Pt endorses usig dentures at home when eating  Pain: no s/s of pain       Allergies: Sulfa (sulfonamide antibiotics), Sulfur , and Aspirin  Current Medications[1]  Past Medical History[2]  Family History[3]  Past Surgical History[4]  Social History     Tobacco Use    Smoking status: Never     Passive exposure: Past    Smokeless tobacco: Never   Substance Use Topics    Alcohol use: Not Currently  General:  Hearing Exceptions: None                 Self-Feeding Capacity: Physical restraint                   Medical Tests / Procedures Comments: CXR 11/5: 1.  Interval extubation with removal of endotracheal tube.  2.  Interval placement of left upper extremity PICC terminating in the superior vena cava.  3.  Worsening mild to moderate interstitial pulmonary edema with moderate to large left and moderate right pleural effusions. CXR 10/12: Interval removal of left internal jugular vein approach central venous catheter, otherwise stable chest.  Equipment/Environment: Telemetry, Vascular access (PIV, TLC, Port-a-cath, PICC, NGT, Restraints       Precautions / Restrictions  Precautions: Falls precautions, Delirium Precautions, Aspiration precautions  Weight Bearing Status: Non-applicable  Required Braces or Orthoses: Non-applicable    Objective     Respiratory Status : O2 via nasal cannula (6L)  History of Intubation: Yes (of note this is pt's 3rd intubation from initial admission)  Length of Intubations (days): 3 days  Date extubated: 04/06/24    Behavior/Cognition: Alert  Positioning : Upright in bed    Oral / Motor Exam  Vocal Quality: Dysphonic (severely hoarse)      Labial ROM: Within Functional Limits   Labial Symmetry: Within Functional Limits  Labial Strength: Within Functional Limits   Lingual ROM: Within Functional Limits  Lingual Symmetry: Within Functional Limits  Lingual Strength: Within Functional Limits   Lingual Sensation: Within Functional Limits      Facial ROM: Within Functional Limits   Facial Symmetry: Within Functional Limits     Facial Sensation: Within Functional Limits   Vocal Intensity: Severely decreased       Apraxia: None present   Dysarthria: None present   Intelligibility: Intelligible   Breath Support: Inadequate for speech (1 word per breath)   Dentition: Edentulous    Consistencies assessed: ice chips    Patient at end of session: All needs in reach, In bed, Lines intact, Notified Nurse, Notified Provider    Speech Therapy Session Duration  SLP Individual [mins]: 10    I attest that I have reviewed the above information.  Signed: Royal GORMAN Butler, SLP    Filed 04/07/2024         [1]   Current Facility-Administered Medications   Medication Dose Route Frequency Provider Last Rate Last Admin    NORepinephrine bitartrate-D5W 8 mg/250 mL (32 mcg/mL) infusion             phenylephrine 1 mg/10 mL (100 mcg/mL) injection Syrg             Propofol  (DIPRIVAN ) 10 mg/mL injection             acetaminophen  (TYLENOL ) tablet 650 mg  650 mg Oral Q4H PRN Sugarman, Lauren A, MD   650 mg at 03/23/24 2331    apixaban (ELIQUIS) tablet 5 mg  5 mg Enteral tube: gastric BID Anwer, Ayesha S, MD   5 mg at 04/07/24 0949    cyanocobalamin (vitamin B-12) tablet 1,000 mcg  1,000 mcg Enteral tube: gastric Daily Wadell Prentice BIRCH, MD   1,000 mcg at 04/07/24 0949    docusate sodium (COLACE) capsule 100 mg  100 mg Oral BID PRN Sugarman, Lauren A, MD        esomeprazole (NEXIUM) granules 40 mg  40 mg Enteral tube: gastric Daily Ancell, Kaitlyn R, MD   40 mg at 04/07/24  9050    fentaNYL  (PF) (SUBLIMAZE ) injection 25 mcg  25 mcg Intravenous Q2H PRN Avel Skeen Appulingam, MD   25 mcg at 04/04/24 1815    Or    fentaNYL  (PF) (SUBLIMAZE ) injection 50 mcg  50 mcg Intravenous Q2H PRN Avel Skeen Appulingam, MD        flu vacc 2025-26 (65 yr up) (PF)(FLUAD)45 mcg(68mcgx3)/0.5 ml IM syringe  0.5 mL Intramuscular During hospitalization Sugarman, Tinnie LABOR, MD        folic acid  (FOLVITE ) tablet 1 mg  1 mg Enteral tube: gastric Daily Wadell Prentice BIRCH, MD   1 mg at 04/07/24 9050    lactulose  oral solution  30 g Enteral tube: gastric TID Dorise Hove, MD   30 g at 04/07/24 0949    multivitamins, therapeutic with minerals tablet 1 tablet  1 tablet Enteral tube: gastric Daily Ancell, Kaitlyn R, MD   1 tablet at 04/07/24 9050    rifAXIMin  (XIFAXAN ) oral suspension  550 mg Enteral tube: gastric BID Anwer, Dreama RAMAN, MD   550 mg at 04/07/24 0949    sodium chloride  (NS) 0.9 % flush 10 mL  10 mL Intravenous Q8H Anwer, Dreama RAMAN, MD   10 mL at 04/07/24 9661    sodium chloride  (NS) 0.9 % flush 10 mL  10 mL Intravenous Q8H Anwer, Dreama RAMAN, MD   10 mL at 04/07/24 9661    thiamine  mononitrate (vit B1) tablet 100 mg  100 mg Enteral tube: gastric Daily Wadell Prentice BIRCH, MD   100 mg at 04/07/24 0949   [2]   Past Medical History:  Diagnosis Date    A-fib (CMS-HCC)     Alcoholism    (CMS-HCC) Alcoholism /alcohol abuse     Cirrhosis    (CMS-HCC)     Depression     Hypertension     Liver disease     Malignant neoplasm of overlapping sites of right breast in female, estrogen receptor positive    (CMS-HCC) 10/03/2020   [3]   Family History  Problem Relation Age of Onset    Heart disease Mother     No Known Problems Father     No Known Problems Sister     No Known Problems Daughter     No Known Problems Maternal Grandmother     No Known Problems Maternal Grandfather     No Known Problems Paternal Grandmother     No Known Problems Paternal Grandfather     Lupus Brother     No Known Problems Other     BRCA 1/2 Neg Hx     Breast cancer Neg Hx     Cancer Neg Hx     Colon cancer Neg Hx     Endometrial cancer Neg Hx     Ovarian cancer Neg Hx     Mental illness Neg Hx     Substance Abuse Disorder Neg Hx    [4]   Past Surgical History:  Procedure Laterality Date    BREAST BIOPSY Right     benign-a long time ago    BREAST BIOPSY Right 07/2020    malignant    BREAST LUMPECTOMY Right     4 2022    CENTRAL LINE  03/25/2024    CHOLECYSTECTOMY      PR BX/REMV,LYMPH NODE,DEEP AXILL Right 09/03/2020    Procedure: BX/EXC LYMPH NODE; OPEN, DEEP AXILRY NODE;  Surgeon: Alm Elsie Como, MD;  [facility];  Service: Surgical Oncology Breast  PR CARDIOVERSION, ELECTIVE;EXTERN N/A 03/10/2024    Procedure: CARDIOVERSION, ELECTIVE, ELECTRICAL CONVERSION OF ARRHYTHMIA; EXTERNAL;  Surgeon: Sedalia Velma Hamilton, MD;  [facility]: [facility] OR [facility];  Service: Cardiology    PR ECHO HEART,TRANSESOPHAGEAL,COMPLETE Midline 03/10/2024    Procedure: ECHOCARDIOGRAPHY, TRANSESOPHAGEAL, REAL-TIME WITH IMAGE DOCUMENTATION;  Surgeon: Sedalia Velma Hamilton, MD;  Location: [facility] OR [facility];  Service: Cardiology    PR INTRAOPERATIVE SENTINEL LYMPH NODE ID W DYE INJECTION Right 09/03/2020    Procedure: INTRAOPERATIVE IDENTIFICATION SENTINEL LYMPH NODE(S) INCLUDE INJECTION NON-RADIOACTIVE DYE, WHEN PERFORMED;  Surgeon: Alm Elsie Como, MD; [facility];  Service: Surgical Oncology Breast    PR MASTECTOMY, PARTIAL Right 09/03/2020    Procedure: MASTECTOMY, PARTIAL (EG, LUMPECTOMY, TYLECTOMY, QUADRANTECTOMY, SEGMENTECTOMY);  Surgeon: Alm Elsie Como, MD;  Location: [facility];  Service: Surgical Oncology Breast    PR UPPER GI ENDOSCOPY,BIOPSY N/A 02/03/2018    Procedure: UGI ENDOSCOPY; WITH BIOPSY, SINGLE OR MULTIPLE;  Surgeon: Eleanor Dewey Sorrel, MD;  Location: [facility] PROCED[facility];  Service: Gastroenterology    RADIATION Right     unsure finish date    TUBAL LIGATION

## 2024-04-07 NOTE — Addendum Note (Signed)
 Addendum  created 04/07/24 1401 by Calista Ginnie Clack, MD    Attestation recorded in Intraprocedure, Flowsheet accepted, Flowsheet data filed from Active Guidelines, Proofreader filed

## 2024-04-07 NOTE — Plan of Care (Addendum)
 Patient oriented to self, attempting to pull out a-line and removing mitts. A fib on telemetry. O2 weaned to 40% 50L via HFNC. Foley and FMS in place. Pills given via NG tube. PICC in left arm. Left internal jugular central line DCed per order. CHG bath given. Remains NPO in case reintubation is necessary.        Problem: Skin Injury Risk Increased  Goal: Skin Health and Integrity  Intervention: Optimize Skin Protection  Recent Flowsheet Documentation  Taken 04/07/2024 0600 by Bevelyn Darilyn ORN, RN  Pressure Reduction Techniques:   heels elevated off bed   weight shift assistance provided  Head of Bed (HOB) Positioning: HOB at 30 degrees  Pressure Reduction Devices: pressure-redistributing mattress utilized  Taken 04/07/2024 0400 by Bevelyn Darilyn ORN, RN  Pressure Reduction Techniques:   heels elevated off bed   weight shift assistance provided  Head of Bed (HOB) Positioning: HOB at 30 degrees  Pressure Reduction Devices: pressure-redistributing mattress utilized  Taken 04/07/2024 0200 by Bevelyn Darilyn ORN, RN  Pressure Reduction Techniques:   heels elevated off bed   weight shift assistance provided  Head of Bed (HOB) Positioning: HOB at 30 degrees  Pressure Reduction Devices: pressure-redistributing mattress utilized  Taken 04/07/2024 0000 by Bevelyn Darilyn ORN, RN  Pressure Reduction Techniques:   heels elevated off bed   weight shift assistance provided  Head of Bed (HOB) Positioning: HOB at 30 degrees  Pressure Reduction Devices: pressure-redistributing mattress utilized  Taken 04/06/2024 2200 by Bevelyn Darilyn ORN, RN  Pressure Reduction Techniques:   heels elevated off bed   weight shift assistance provided  Head of Bed (HOB) Positioning: HOB at 30 degrees  Pressure Reduction Devices: pressure-redistributing mattress utilized  Taken 04/06/2024 2000 by Bevelyn Darilyn ORN, RN  Activity Management: bedrest  Pressure Reduction Techniques: (heel elevation refused) weight shift assistance provided  Head of Bed (HOB) Positioning: HOB at 30 degrees  Pressure Reduction Devices: pressure-redistributing mattress utilized  Skin Protection: adhesive use limited     Problem: Adult Inpatient Plan of Care  Goal: Absence of Hospital-Acquired Illness or Injury  Intervention: Identify and Manage Fall Risk  Recent Flowsheet Documentation  Taken 04/06/2024 2000 by Bevelyn Darilyn ORN, RN  Safety Interventions:   bed alarm   aspiration precautions   enteral feeding safety   low bed  Intervention: Prevent Skin Injury  Recent Flowsheet Documentation  Taken 04/07/2024 0600 by Bevelyn Darilyn ORN, RN  Positioning for Skin: Left  Taken 04/07/2024 0400 by Bevelyn Darilyn ORN, RN  Positioning for Skin: Right  Taken 04/07/2024 0200 by Bevelyn Darilyn ORN, RN  Positioning for Skin: Left  Taken 04/07/2024 0000 by Bevelyn Darilyn ORN, RN  Positioning for Skin: Right  Taken 04/06/2024 2200 by Bevelyn Darilyn ORN, RN  Positioning for Skin: Left  Taken 04/06/2024 2000 by Bevelyn Darilyn ORN, RN  Positioning for Skin: Right  Device Skin Pressure Protection: absorbent pad utilized/changed  Skin Protection: adhesive use limited  Intervention: Prevent Infection  Recent Flowsheet Documentation  Taken 04/06/2024 2000 by Bevelyn Darilyn ORN, RN  Infection Prevention: single patient room provided     Problem: Breathing Pattern Ineffective  Goal: Effective Breathing Pattern  Intervention: Promote Improved Breathing Pattern  Recent Flowsheet Documentation  Taken 04/07/2024 0600 by Bevelyn Darilyn ORN, RN  Head of Bed [facility]) Positioning: HOB at 30 degrees  Taken 04/07/2024 0400 by Bevelyn Darilyn ORN, RN  Head of Bed [facility]) Positioning: HOB at 30 degrees  Taken 04/07/2024 0200 by Bevelyn Darilyn ORN, RN  Head  of Bed [facility]) Positioning: HOB at 30 degrees  Taken 04/07/2024 0000 by Bevelyn Darilyn ORN, RN  Head of Bed [facility]) Positioning: HOB at 30 degrees  Taken 04/06/2024 2200 by Bevelyn Darilyn ORN, RN  Head of Bed [facility]) Positioning: HOB at 30 degrees  Taken 04/06/2024 2000 by Bevelyn Darilyn ORN, RN  Head of Bed [facility]) Positioning: HOB at 30 degrees     Problem: Gas Exchange Impaired  Goal: Optimal Gas Exchange  Intervention: Optimize Oxygenation and Ventilation  Recent Flowsheet Documentation  Taken 04/07/2024 0600 by Bevelyn Darilyn ORN, RN  Head of Bed [facility]) Positioning: HOB at 30 degrees  Taken 04/07/2024 0400 by Bevelyn Darilyn ORN, RN  Head of Bed [facility]) Positioning: HOB at 30 degrees  Taken 04/07/2024 0200 by Bevelyn Darilyn ORN, RN  Head of Bed [facility]) Positioning: HOB at 30 degrees  Taken 04/07/2024 0000 by Bevelyn Darilyn ORN, RN  Head of Bed [facility]) Positioning: HOB at 30 degrees  Taken 04/06/2024 2200 by Bevelyn Darilyn ORN, RN  Head of Bed [facility]) Positioning: HOB at 30 degrees  Taken 04/06/2024 2000 by Bevelyn Darilyn ORN, RN  Head of Bed [facility]) Positioning: HOB at 30 degrees     Problem: Non-Violent Restraints  Intervention: Utilize least restrictive measures  Recent Flowsheet Documentation  Taken 04/07/2024 0600 by Bevelyn Darilyn ORN, RN  Less Restrictive Alternative:   Repositioning   Alarm  Taken 04/07/2024 0400 by Bevelyn Darilyn ORN, RN  Less Restrictive Alternative:   Repositioning   Alarm  Taken 04/07/2024 0200 by Bevelyn Darilyn ORN, RN  Less Restrictive Alternative:   Repositioning   Alarm  Taken 04/07/2024 0056 by Bevelyn Darilyn ORN, RN  Less Restrictive Alternative:   Repositioning   Alarm  Taken 04/07/2024 0000 by Bevelyn Darilyn ORN, RN  Less Restrictive Alternative:   Repositioning   Alarm  Taken 04/06/2024 2200 by Bevelyn Darilyn ORN, RN  Less Restrictive Alternative:   Repositioning   Alarm  Taken 04/06/2024 2000 by Bevelyn Darilyn ORN, RN  Less Restrictive Alternative:   Repositioning   Alarm  Intervention: Patient Monitoring  Recent Flowsheet Documentation  Taken 04/07/2024 0600 by Bevelyn Darilyn ORN, RN  Psychological Status/Visual Check: Agitated/restless  Circulation/Skin Integrity: No signs of injury  Range of Motion: Performed  Fluids: NPO  Food/Meal: NPO  Elimination: Urinary catheter  Taken 04/07/2024 0400 by Bevelyn Darilyn ORN, RN  Psychological Status/Visual Check: Agitated/restless  Circulation/Skin Integrity: No signs of injury  Range of Motion: Performed  Fluids: NPO  Food/Meal: NPO  Elimination: Urinary catheter  Taken 04/07/2024 0200 by Bevelyn Darilyn ORN, RN  Psychological Status/Visual Check: Agitated/restless  Circulation/Skin Integrity: No signs of injury  Range of Motion: Performed  Fluids: NPO  Food/Meal: NPO  Elimination: Urinary catheter  Taken 04/07/2024 0056 by Bevelyn Darilyn ORN, RN  Psychological Status/Visual Check: Agitated/restless  Circulation/Skin Integrity: No signs of injury  Range of Motion: Performed  Fluids: NPO  Food/Meal: NPO  Elimination: Urinary catheter  Taken 04/07/2024 0000 by Bevelyn Darilyn ORN, RN  Psychological Status/Visual Check: Subdued  Circulation/Skin Integrity: No signs of injury  Range of Motion: Performed  Fluids: NPO  Food/Meal: NPO  Elimination: Urinary catheter  Taken 04/06/2024 2200 by Bevelyn Darilyn ORN, RN  Psychological Status/Visual Check: Subdued  Circulation/Skin Integrity: No signs of injury  Range of Motion: Performed  Fluids: NPO  Food/Meal: NPO  Elimination: Urinary catheter  Taken 04/06/2024 2000 by Bevelyn Darilyn ORN, RN  Psychological Status/Visual Check: Agitated/restless  Circulation/Skin Integrity: No signs of injury  Range of  Motion: Performed  Fluids: NPO  Food/Meal: NPO  Elimination: Urinary catheter  Intervention: Patient Education  Recent Flowsheet Documentation  Taken 04/07/2024 0056 by Bevelyn Darilyn ORN, RN  Criteria Explained: Yes  Patient's Response: No evidence of learning  Family Notification: Other

## 2024-04-07 NOTE — Progress Notes (Signed)
 MICU Daily Progress Note     Date of Service: 04/07/2024    Problem List:   Principal Problem:    Bacteremia, escherichia coli  Active Problems:    Alcoholic liver disease (HHS-HCC)    HTN (hypertension)    Depression with anxiety    Class 2 severe obesity with serious comorbidity in adult    Atrial fibrillation    (CMS-HCC)    Pleural effusion    Hypernatremia    Thrombocytopenia    Cirrhosis    (CMS-HCC)    A-fib (CMS-HCC)    Hepatic encephalopathy    (CMS-HCC)      Interval history: [name] is a 78 y.o. female with HFpEF, HTN, Afib on AC, MASLD cirrhosis, originally hospitalized for scheduled TEE/DCCV (aborted d/t LAA clot) c/b AHHRF, encephalopathy and septic shock requiring MICU care, was s/p extubation transferred to [facility] for further management of anticoagulation and hypernatremia. Transferred back to [facility] MICU for closer monitoring for respiratory status, pressor requirement, GI bleed, and management of carotid artery injury.     On 10/24, patient had acute worsening of respiratory status leading requiring intubation. When patient was given sedation for intubation, she coded and promptly had ROSC after a few minutes of CPR. Subsequently, CVC placed in neck, used to draw blood, unclear if given pressors through it, then found  to be in arterial system based on blood gas and imaging, likely right carotid. Line was subsequently removed, and pressure was held for 15 minutes, hemostasis with no hematoma. She was transferred to [facility] MICU for management of this carotid injury, to be evaluated by vascular surgery while also managing her increased vasopressor requirement and respiratory distress.  Has been weaned off pressors since 10/27 at 1 PM. VBG's indicating that she is ready for extubation, however her mentation suggests that she is not able to protect her airway.  She was extubated on 10/31, and mentation slowly improved.  Unfortunately on the morning of 11/2 she had increased lethargy, increased secretions, and she was not protecting her airway.  She was reintubated on 11/2, and required pressor support with norepinephrine and vasopressin after the induction agents.  Self extubated on 11/5.    24 hour events:  - Continue lactulose  and rifaximin    - CO2 rising this morning off of the ventilator  - Mental status remains waxing and waning, was very active last night  - Responded well to Lasix 80, re-diuresing today     Neurological   Hepatic encephalopathy  Per chart review at [facility], rapid response called on 10/24 for increased somnolence. At the time, differential included hepatic encephalopathy, toxic metabolic encephalopathy in the setting of shock (possibly sepsis), versus intracranial pathology. She was subsequently intubated. CT head on 10/24 showed no acute intracranial abnormality. Weaned off sedation with slow improvements in neurological status.  While we have continued lactulose  therapy and seeing good response with several bowel movements, her mentation remains poor.  This is concerning for severe ICU delirium.  Meeting was had with [name], [name], on 11/5.  Described that should intubation be necessary we would likely be moving towards a tracheostomy as [name] has continuously not been protecting her airway.  As of now [name] wanted to pursue aggressive measures including repeat intubation and potential tracheostomy.  However she stated that she will talk it over with her sister and the rest of her family.  - Continue lactulose , target 3-5 BMs daily.  MRI brain was impeded due to movement, however with we saw  was unremarkable.  - Continue lactulose  and rifaximin      Analgesia: No pain issues  RASS at goal? N/A, not on sedation  [place] Agitation Assessment Scale (RASS) : +1 (04/07/2024  4:00 AM)    Pulmonary   Acute Hypoxic Hypercarbic Respiratory Failure   Rapid response called on 10/24 for increased somnolence, found to have acute hypercarbic respiratory failure and subsequently intubated. Suspect hypercarbic respiratory failure due to encephalopathy as above. CXR on 10/24 showed worsening pleural effusions. Not currently getting diuresed. Given patient's hypoxia, and AMS, there was concern for infectious etiology. MRSA nares negative. Infectious workup thus far is unremarkable. SBTs show readiness extubation based on VBGs, however there is concern that she continues to be unable to protect her airway due to her current mentation.  Reintubated on 11/2 followed by therapeutic bronchoscopy.  Self extubated on 11/5.   - Currently on high flow nasal cannula    Cardiovascular   Carotid Artery Injury (resolved)  At [facility], CVC placed intended for RIJ on 10/24 following intubation, PEA, ROSC. Was used with known blood return. Unclear if it was used for pressor support, but highly likely that it was. CXR for placement check showed concern for intra aterial location, and blood gas confirmed it was arterial. Thought to be in carotid artery. CVC removed with pressure held for 15 minutes, with no hematoma formation. On arrival, no physical exam and bedside ultrasound showed no large hematoma. No active bleeding.   - Vascular Surgery consulted, stated fistula has healed based on Ultrasound findings    PEA arrest s/p ROSC  Hypovolemic/Hemorrhagic shock (resolved)  Atrial fibrillation  known left atrial appendage clot  HFpEF  Patient with PEA arrest peri-intubation with ROSC. Shock thought to be hemorrhagic in the setting of GI bleed History of atrial fibrillation with known LAA clot and HFpEF. Vasopressors were weaned off 10/27. APTT was elevated the morning of 10/28 necessitating changing anticoagulation from therapeutic heparin to therapeutic Lovenox .   - Holding Coreg   - Continue home Eliquis for anticoagulation for LAA clot    Renal   Hypernatremia (resolved)  At [facility], had persistently hypernatremia near 153. Had been treated with D5 gtt. Sodium throughout admission has largely been 146-148. Sodium was 151 morning of 10/28, presume this will decrease once we start tube feeds.  Sodium has overall normalized and continues to be within normal limits.  - Strict I/O's  - Sodium check q6hours   - FWF with TF, adjusting as needed    Infectious Disease/Autoimmune   E. Coli Bacteremia (resolved),   Initially admitted for afib, found to have E. coli bacteremia on October 10. This was treated with cefazolin . Rapid response called 10/24, patient noted to be hypothermic. WBCs jumped from 3.0 to 9.7 on 10/24. Although the most recent lab was following intubation and CPR. UA with pyuria, rare bacteria, hematuria. Peri-intubation, patient developed significant hypotension requiring initiation of NE and vasopressin. Suspect possible septic shock with unknown infectious source. Patient started on vancomycin and cefepime 10/24. CT Abdomen pelvis concerning for aspiration pneumonia. Following infectious workup was unremarkable. Antibiotics were stopped on 10/27.  Infectious workup was repeated on 11/3 due to reintubation and status post bronchoscopy.  - follow blood, urine cultures to final, so far no growth to date    Cultures:  Blood Culture, Routine (no units)   Date Value   04/04/2024 No Growth at 48 hours   04/04/2024 No Growth at 48 hours     Urine Culture, Comprehensive (no units)  Date Value   04/04/2024 NO GROWTH   03/25/2024 <10,000 CFU/mL     Lower Respiratory Culture (no units)   Date Value   04/04/2024 OROPHARYNGEAL FLORA ISOLATED   03/29/2024 Specimen Not Processed     WBC (10*9/L)   Date Value   04/07/2024 5.4     WBC, UA (/HPF)   Date Value   03/25/2024 25 (H)       FEN/GI   Sigmoid Mass c/f Malignancy and Hematochezia (resolved)  Hemorrhagic Shock (resolved)  CT abdomen pelvis done 10/24 with evidence of cirrhosis and masslike thickening of the lower sigmoid colon concerning for malignancy.  CTA abdomen pelvis performed 1024 with active extravasation into the gastric fundus possibly secondary to traumatic enteric tube placement.  GI scoped the patient on 10/25 and clipped an area of active bleeding.  They removed a total of 1.5 L of blood.  Hemoglobin 10/20 6 AM downtrended to 6.0, so 2 units of blood transfused.  GI stated this was consistent with 10/25 scope and likely did not represent increased bleeding. Hb steadily improving with Hb of 9.2 on 10/28.   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 Given shock of unclear etiology. CTAP demonstrating cirrhosis. Likely that cirrhosis is contributing to hypotension.   - Continue lactulose ; target 3-5 BMs daily  - NGT in place, on TF and FWF  - Daily MELD labs and CMP     MELD 3.0: 15 at 04/05/2024  4:31 AM  MELD-Na: 13 at 04/05/2024  4:31 AM  Calculated from:  Serum Creatinine: 0.43 mg/dL (Using min of 1 mg/dL) at 88/09/7972  5:68 AM  Serum Sodium: 143 mmol/L (Using max of 137 mmol/L) at 04/05/2024  4:31 AM  Total Bilirubin: 1.3 mg/dL at 88/09/7972  5:68 AM  Serum Albumin: 2.4 g/dL at 88/09/7972  5:68 AM  INR(ratio): 1.61 at 04/03/2024 10:26 AM  Age at listing (hypothetical): 78 years  Sex: Female at 04/05/2024  4:31 AM    Provider Malnutrition Assessment:  Body mass index is 39.22 kg/m??. BMI Interpretation: >/= 30 and < 40, consistent with obesity, clinically significant requiring additional resources and complicating multiple aspects of patient care.  GLIM criteria:   Pt does not meet criteria  -I have screened this patient for malnutrition and they did NOT meet criteria for malnutrition based on GLIM criteria.  -TF and FWF; Dietician consulted, appreciate assistance    Heme/Coag   Acute blood loss anemia  Hemoglobin down trended to 6.0 on the morning of 10/26 thought to be secondary to acute GI bleed that was scoped and clipped 10/25. Patient received 2 units PRBC 10/26 and an additional unit 10/27. Hb steadily improving with Hb of 9.2 on 10/28.   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 Reason: Not indicated.    Prophylaxis/LDA/Restraints/Consults   ICU Checklist completed: yes (see ICU rounding navigator in Epic)    Patient Lines/Drains/Airways Status       Active Active Lines, Drains, & Airways       Name Placement date Placement time Site Days  NG/OG Tube Feedings 10 Fr. Right nostril 04/01/24  1138  Right nostril  5    Urethral Catheter Temperature probe 16 Fr. 04/03/24  0830  Temperature probe  4    PICC Double Lumen 04/06/24 Left Basilic 04/06/24  1558  Basilic  less than 1    Arterial Line 04/03/24 Right Radial 04/03/24  0830  Radial  4                  Patient Lines/Drains/Airways Status       Active Wounds       Name Placement date Placement time Site Days    Wound 03/01/24 Other (Comment) Toe (Comment which one) Left;Other (Comment) Blister present on arrival, tx already in outpatient setting, in process of healing, surrounding erythema 03/01/24  0129  Toe (Comment which one)  37    Wound 03/13/24 Irritant Contact Dermatitis Incontinence Sacrum Mid gluteal cleft MASD? 03/13/24  --  Sacrum  25                  Goals of Care     Code Status:   Orders Placed This Encounter   Procedures    Full Code     Standing Status:   Standing     Number of Occurrences:   1        Designated Healthcare Decision Maker:  Ms. Revelle designated healthcare decision maker(s) is/are   HCDM (patient stated preference): [place],[name] - [phone]    HCDM, [facility], [name] - [phone]. See HCDM section of Epic sidebar/storyboard or ACP tab in patient chart for details regarding active HCDMs and patient capacity for decision-making.      Subjective     Patient seen shortly after self extubation, was following commands and speaking.    Objective     Vitals - past 24 hours  Temp:  [37.7 ??C (99.9 ??F)-37.9 ??C (100.2 ??F)] 37.7 ??C (99.9 ??F)  Pulse:  [85-130] 100  SpO2 Pulse:  [85-129] 96  Resp:  [19-35] 26  FiO2 (%):  [30 %-40 %] 40 %  SpO2:  [97 %-100 %] 99 % Intake/Output  I/O last 3 completed shifts:  In: 1242.7 [NG/GT:840; IV Piggyback:402.7]  Out: 4745 [Urine:3335; Emesis/NG output:200; Stool:1210]     Physical Exam:    General: Drowsy, responding to yes/no questions  HEENT: normocephalic, atraumatic   CV: pulses palpable, regular borderline tachycardia   Pulm: Rhonchi w transmitted upper airway sounds throughout  GI: soft, NTND, + BS  MSK: Edema improved from yesterday  Skin: numerous large flat violaceous ecchymosis on left arm, chest, neck bilaterally.   Neuro: Withdrawals to painful stimuli. Responding to name and reliably following commands.     Continuous Infusions:   Infusions Meds[1]    Scheduled Medications:   Scheduled Medications[2]    PRN medications:  PRN Medications[3]    Data/Imaging Review: Reviewed in Epic and personally interpreted on 04/07/2024. See EMR for detailed results.        Alethea Terhaar S Keiffer Piper, MD  PGY1 Internal Medicine, [facility]                         [1] [2]    apixaban  5 mg Enteral tube: gastric BID    cyanocobalamin (vitamin B-12)  1,000 mcg Enteral tube: gastric Daily    esomeprazole  40 mg Enteral tube: gastric Daily    flu vac 2025 [id number](PF)  0.5 mL Intramuscular During  hospitalization    folic acid   1 mg Enteral tube: gastric Daily    furosemide  80 mg Intravenous Once    lactulose   30 g Enteral tube: gastric TID    multivitamins (ADULT)  1 tablet Enteral tube: gastric Daily    potassium chloride   20 mEq Enteral tube: gastric Once    rifAXIMin   550 mg Enteral tube: gastric BID    sodium chloride   10 mL Intravenous Q8H    sodium chloride   10 mL Intravenous Q8H    thiamine  mononitrate (vit B1)  100 mg Enteral tube: gastric Daily   [3] acetaminophen , docusate sodium, fentaNYL  (PF) **OR** fentaNYL  (PF)

## 2024-04-07 NOTE — Plan of Care (Signed)
 Pt is intubated for increased work of breathing during shift.       Problem: Gas Exchange Impaired  Goal: Optimal Gas Exchange  Outcome: Shift Focus  Intervention: Optimize Oxygenation and Ventilation  Recent Flowsheet Documentation  Taken 04/07/2024 1800 by Talitha Oakland, RN  Head of Bed [facility]) Positioning: HOB at 30-45 degrees  Taken 04/07/2024 1600 by Talitha Oakland, RN  Head of Bed [facility]) Positioning: HOB at 30-45 degrees  Taken 04/07/2024 1200 by Talitha Oakland, RN  Head of Bed [facility]) Positioning: HOB at 30-45 degrees  Taken 04/07/2024 0800 by Talitha Oakland, RN  Head of Bed [facility]) Positioning: HOB at 30-45 degrees     Problem: Non-Violent Restraints  Intervention: Utilize least restrictive measures  Recent Flowsheet Documentation  Taken 04/07/2024 1800 by Talitha Oakland, RN  Less Restrictive Alternative: Repositioning  Taken 04/07/2024 1600 by Talitha Oakland, RN  Less Restrictive Alternative: Repositioning  Taken 04/07/2024 1400 by Talitha Oakland, RN  Less Restrictive Alternative: Repositioning  Taken 04/07/2024 1200 by Talitha Oakland, RN  Less Restrictive Alternative: Repositioning  Taken 04/07/2024 1000 by Talitha Oakland, RN  Less Restrictive Alternative: Repositioning  Taken 04/07/2024 0800 by Talitha Oakland, RN  Less Restrictive Alternative:   Repositioning   Pain management

## 2024-04-08 LAB — BASIC METABOLIC PANEL
ANION GAP: 9 mmol/L (ref 5–14)
ANION GAP: 14 mmol/L (ref 5–14)
ANION GAP: 12 mmol/L (ref 5–14)
BLOOD UREA NITROGEN: 13 mg/dL (ref 9–23)
BLOOD UREA NITROGEN: 16 mg/dL (ref 9–23)
BLOOD UREA NITROGEN: 15 mg/dL (ref 9–23)
BUN / CREAT RATIO: 27
BUN / CREAT RATIO: 33
BUN / CREAT RATIO: 29
CALCIUM: 8 mg/dL — ABNORMAL LOW (ref 8.7–10.4)
CALCIUM: 8 mg/dL — ABNORMAL LOW (ref 8.7–10.4)
CALCIUM: 7.8 mg/dL — ABNORMAL LOW (ref 8.7–10.4)
CHLORIDE: 108 mmol/L — ABNORMAL HIGH (ref 98–107)
CHLORIDE: 107 mmol/L (ref 98–107)
CHLORIDE: 109 mmol/L — ABNORMAL HIGH (ref 98–107)
CO2: 31 mmol/L (ref 20.0–31.0)
CO2: 28 mmol/L (ref 20.0–31.0)
CO2: 28 mmol/L (ref 20.0–31.0)
CREATININE: 0.49 mg/dL — ABNORMAL LOW (ref 0.55–1.02)
CREATININE: 0.48 mg/dL — ABNORMAL LOW (ref 0.55–1.02)
CREATININE: 0.51 mg/dL — ABNORMAL LOW (ref 0.55–1.02)
EGFR CKD-EPI (2021) FEMALE: >90 mL/min/1.73m2 (ref >=60)
EGFR CKD-EPI (2021) FEMALE: >90 mL/min/1.73m2 (ref >=60)
EGFR CKD-EPI (2021) FEMALE: >90 mL/min/1.73m2 (ref >=60)
GLUCOSE RANDOM: 158 mg/dL (ref 70–179)
GLUCOSE RANDOM: 158 mg/dL (ref 70–179)
GLUCOSE RANDOM: 161 mg/dL (ref 70–179)
POTASSIUM: 3.3 mmol/L — ABNORMAL LOW (ref 3.4–4.8)
POTASSIUM: 2.8 mmol/L — ABNORMAL LOW (ref 3.4–4.8)
POTASSIUM: 4.3 mmol/L (ref 3.4–4.8)
SODIUM: 148 mmol/L — ABNORMAL HIGH (ref 135–145)
SODIUM: 149 mmol/L — ABNORMAL HIGH (ref 135–145)
SODIUM: 149 mmol/L — ABNORMAL HIGH (ref 135–145)

## 2024-04-08 LAB — BLOOD GAS CRITICAL CARE PANEL, ARTERIAL
BASE EXCESS ARTERIAL: 4.1 — ABNORMAL HIGH (ref -2.0–2.0)
BASE EXCESS ARTERIAL: 6 — ABNORMAL HIGH (ref -2.0–2.0)
BASE EXCESS ARTERIAL: 3.2 — ABNORMAL HIGH (ref -2.0–2.0)
BASE EXCESS ARTERIAL: 5.3 — ABNORMAL HIGH (ref -2.0–2.0)
BASE EXCESS ARTERIAL: 4.8 — ABNORMAL HIGH (ref -2.0–2.0)
BASE EXCESS ARTERIAL: 7.5 — ABNORMAL HIGH (ref -2.0–2.0)
CALCIUM IONIZED ARTERIAL (MG/DL): 4.27 mg/dL — ABNORMAL LOW (ref 4.40–5.40)
CALCIUM IONIZED ARTERIAL (MG/DL): 4.76 mg/dL (ref 4.40–5.40)
CALCIUM IONIZED ARTERIAL (MG/DL): 4.76 mg/dL (ref 4.40–5.40)
CALCIUM IONIZED ARTERIAL (MG/DL): 4.6 mg/dL (ref 4.40–5.40)
CALCIUM IONIZED ARTERIAL (MG/DL): 4.1 mg/dL — ABNORMAL LOW (ref 4.40–5.40)
CALCIUM IONIZED ARTERIAL (MG/DL): 4.68 mg/dL (ref 4.40–5.40)
CARBOXYHEMOGLOBIN: 4.3 % — ABNORMAL HIGH (ref <1.2)
CARBOXYHEMOGLOBIN: 2.2 % — ABNORMAL HIGH (ref <1.2)
CARBOXYHEMOGLOBIN: 2.2 % — ABNORMAL HIGH (ref <1.2)
CARBOXYHEMOGLOBIN: 1.8 % — ABNORMAL HIGH (ref <1.2)
CARBOXYHEMOGLOBIN: 2.4 % — ABNORMAL HIGH (ref <1.2)
CARBOXYHEMOGLOBIN: 2.4 % — ABNORMAL HIGH (ref <1.2)
CHLORIDE, WHOLE BLOOD: 114 mmol/L — ABNORMAL HIGH (ref 98–107)
CHLORIDE, WHOLE BLOOD: 108 mmol/L — ABNORMAL HIGH (ref 98–107)
CHLORIDE, WHOLE BLOOD: 113 mmol/L — ABNORMAL HIGH (ref 98–107)
CHLORIDE, WHOLE BLOOD: 118 mmol/L — ABNORMAL HIGH (ref 98–107)
CHLORIDE, WHOLE BLOOD: 115 mmol/L — ABNORMAL HIGH (ref 98–107)
GLUCOSE WHOLE BLOOD: 145 mg/dL (ref 70–179)
GLUCOSE WHOLE BLOOD: 158 mg/dL (ref 70–179)
GLUCOSE WHOLE BLOOD: 175 mg/dL (ref 70–179)
GLUCOSE WHOLE BLOOD: 158 mg/dL (ref 70–179)
GLUCOSE WHOLE BLOOD: 156 mg/dL (ref 70–179)
GLUCOSE WHOLE BLOOD: 229 mg/dL — ABNORMAL HIGH (ref 70–179)
HCO3 ARTERIAL: 28 mmol/L — ABNORMAL HIGH (ref 22–27)
HCO3 ARTERIAL: 30 mmol/L — ABNORMAL HIGH (ref 22–27)
HCO3 ARTERIAL: 28 mmol/L — ABNORMAL HIGH (ref 22–27)
HCO3 ARTERIAL: 30 mmol/L — ABNORMAL HIGH (ref 22–27)
HCO3 ARTERIAL: 28 mmol/L — ABNORMAL HIGH (ref 22–27)
HCO3 ARTERIAL: 31 mmol/L — ABNORMAL HIGH (ref 22–27)
HEMOGLOBIN BLOOD GAS: 12.4 g/dL (ref 12.00–16.00)
HEMOGLOBIN BLOOD GAS: 9.2 g/dL — ABNORMAL LOW (ref 12.00–16.00)
HEMOGLOBIN BLOOD GAS: 8.5 g/dL — ABNORMAL LOW (ref 12.00–16.00)
HEMOGLOBIN BLOOD GAS: 9.3 g/dL — ABNORMAL LOW (ref 12.00–16.00)
HEMOGLOBIN BLOOD GAS: 8.8 g/dL — ABNORMAL LOW (ref 12.00–16.00)
HEMOGLOBIN BLOOD GAS: 8.6 g/dL — ABNORMAL LOW (ref 12.00–16.00)
LACTATE BLOOD ARTERIAL: 2.1 mmol/L — ABNORMAL HIGH (ref <1.3)
LACTATE BLOOD ARTERIAL: 2.1 mmol/L — ABNORMAL HIGH (ref <1.3)
LACTATE BLOOD ARTERIAL: 2 mmol/L — ABNORMAL HIGH (ref <1.3)
LACTATE BLOOD ARTERIAL: 2.7 mmol/L — ABNORMAL HIGH (ref <1.3)
LACTATE BLOOD ARTERIAL: 2.2 mmol/L — ABNORMAL HIGH (ref <1.3)
LACTATE BLOOD ARTERIAL: 1.5 mmol/L — ABNORMAL HIGH (ref <1.3)
METHEMOGLOBIN: 1.4 % (ref <1.5)
METHEMOGLOBIN: <1 % (ref <1.5)
METHEMOGLOBIN: <1 % (ref <1.5)
METHEMOGLOBIN: <1 % (ref <1.5)
METHEMOGLOBIN: <1 % (ref <1.5)
METHEMOGLOBIN: <1 % (ref <1.5)
O2 SATURATION ARTERIAL: 97.3 % (ref 94.0–100.0)
O2 SATURATION ARTERIAL: >100 % — ABNORMAL HIGH (ref 94.0–100.0)
O2 SATURATION ARTERIAL: 97.6 % (ref 94.0–100.0)
O2 SATURATION ARTERIAL: 95.2 % (ref 94.0–100.0)
O2 SATURATION ARTERIAL: 98.6 % (ref 94.0–100.0)
O2 SATURATION ARTERIAL: 98.4 % (ref 94.0–100.0)
OXYHEMOGLOBIN: 91.8 % — ABNORMAL LOW (ref 94.0–100.0)
OXYHEMOGLOBIN: 97.5 % (ref 94.0–100.0)
OXYHEMOGLOBIN: 95 % (ref 94.0–100.0)
OXYHEMOGLOBIN: 93.1 % — ABNORMAL LOW (ref 94.0–100.0)
OXYHEMOGLOBIN: 96 % (ref 94.0–100.0)
OXYHEMOGLOBIN: 95.4 % (ref 94.0–100.0)
PCO2 ARTERIAL: 39.8 mmHg (ref 35.0–45.0)
PCO2 ARTERIAL: 40.6 mmHg (ref 35.0–45.0)
PCO2 ARTERIAL: 44.2 mmHg (ref 35.0–45.0)
PCO2 ARTERIAL: 42.6 mmHg (ref 35.0–45.0)
PCO2 ARTERIAL: 37.6 mmHg (ref 35.0–45.0)
PCO2 ARTERIAL: 39.5 mmHg (ref 35.0–45.0)
PH ARTERIAL: 7.46 — ABNORMAL HIGH (ref 7.35–7.45)
PH ARTERIAL: 7.48 — ABNORMAL HIGH (ref 7.35–7.45)
PH ARTERIAL: 7.41 (ref 7.35–7.45)
PH ARTERIAL: 7.45 (ref 7.35–7.45)
PH ARTERIAL: 7.49 — ABNORMAL HIGH (ref 7.35–7.45)
PH ARTERIAL: 7.51 — ABNORMAL HIGH (ref 7.35–7.45)
PO2 ARTERIAL: 103 mmHg (ref 80.0–110.0)
PO2 ARTERIAL: 129 mmHg — ABNORMAL HIGH (ref 80.0–110.0)
PO2 ARTERIAL: 93.1 mmHg (ref 80.0–110.0)
PO2 ARTERIAL: 70.1 mmHg — ABNORMAL LOW (ref 80.0–110.0)
PO2 ARTERIAL: 93.9 mmHg (ref 80.0–110.0)
PO2 ARTERIAL: 93.3 mmHg (ref 80.0–110.0)
POTASSIUM WHOLE BLOOD: 3 mmol/L — ABNORMAL LOW (ref 3.4–4.6)
POTASSIUM WHOLE BLOOD: 3.1 mmol/L — ABNORMAL LOW (ref 3.4–4.6)
POTASSIUM WHOLE BLOOD: 2.8 mmol/L — ABNORMAL LOW (ref 3.4–4.6)
POTASSIUM WHOLE BLOOD: 2.8 mmol/L — ABNORMAL LOW (ref 3.4–4.6)
POTASSIUM WHOLE BLOOD: 3.8 mmol/L (ref 3.4–4.6)
POTASSIUM WHOLE BLOOD: 3.9 mmol/L (ref 3.4–4.6)
SODIUM WHOLE BLOOD: 146 mmol/L — ABNORMAL HIGH (ref 135–145)
SODIUM WHOLE BLOOD: 144 mmol/L (ref 135–145)
SODIUM WHOLE BLOOD: 146 mmol/L — ABNORMAL HIGH (ref 135–145)
SODIUM WHOLE BLOOD: 146 mmol/L — ABNORMAL HIGH (ref 135–145)
SODIUM WHOLE BLOOD: 142 mmol/L (ref 135–145)
SODIUM WHOLE BLOOD: 141 mmol/L (ref 135–145)

## 2024-04-08 LAB — HEPATIC FUNCTION PANEL
ALBUMIN: 2.1 g/dL — ABNORMAL LOW (ref 3.4–5.0)
ALKALINE PHOSPHATASE: 137 U/L — ABNORMAL HIGH (ref 46–116)
ALT (SGPT): 20 U/L (ref 10–49)
AST (SGOT): 28 U/L (ref <=34)
BILIRUBIN DIRECT: 0.6 mg/dL — ABNORMAL HIGH (ref 0.00–0.30)
BILIRUBIN TOTAL: 0.9 mg/dL (ref 0.3–1.2)
PROTEIN TOTAL: 4.8 g/dL — ABNORMAL LOW (ref 5.7–8.2)

## 2024-04-08 LAB — CYSTATIN C
CYSTATIN C: 1.58 mg/L — ABNORMAL HIGH (ref 0.64–1.23)
EGFR CKD-EPI (2012) CYSTATIN C FEMALE: 37 mL/min/1.73m2 — ABNORMAL LOW (ref >=60)

## 2024-04-08 LAB — CBC W/ AUTO DIFF
BASOPHILS ABSOLUTE COUNT: 0 10*9/L (ref 0.0–0.1)
BASOPHILS RELATIVE PERCENT: 0.5 %
EOSINOPHILS ABSOLUTE COUNT: 0.2 10*9/L (ref 0.0–0.5)
EOSINOPHILS RELATIVE PERCENT: 3.3 %
HEMATOCRIT: 24 % — ABNORMAL LOW (ref 34.0–44.0)
HEMOGLOBIN: 8.1 g/dL — ABNORMAL LOW (ref 11.3–14.9)
LYMPHOCYTES ABSOLUTE COUNT: 0.8 10*9/L — ABNORMAL LOW (ref 1.1–3.6)
LYMPHOCYTES RELATIVE PERCENT: 13.4 %
MEAN CORPUSCULAR HEMOGLOBIN CONC: 33.5 g/dL (ref 32.0–36.0)
MEAN CORPUSCULAR HEMOGLOBIN: 33.1 pg — ABNORMAL HIGH (ref 25.9–32.4)
MEAN CORPUSCULAR VOLUME: 98.7 fL — ABNORMAL HIGH (ref 77.6–95.7)
MEAN PLATELET VOLUME: 8.3 fL (ref 6.8–10.7)
MONOCYTES ABSOLUTE COUNT: 0.8 10*9/L (ref 0.3–0.8)
MONOCYTES RELATIVE PERCENT: 13.2 %
NEUTROPHILS ABSOLUTE COUNT: 4.3 10*9/L (ref 1.8–7.8)
NEUTROPHILS RELATIVE PERCENT: 69.6 %
PLATELET COUNT: 161 10*9/L (ref 150–450)
RED BLOOD CELL COUNT: 2.43 10*12/L — ABNORMAL LOW (ref 3.95–5.13)
RED CELL DISTRIBUTION WIDTH: 24 % — ABNORMAL HIGH (ref 12.2–15.2)
WBC ADJUSTED: 6.1 10*9/L (ref 3.6–11.2)

## 2024-04-08 LAB — MAGNESIUM
MAGNESIUM: 1.7 mg/dL (ref 1.6–2.6)
MAGNESIUM: 2 mg/dL (ref 1.6–2.6)

## 2024-04-08 LAB — PHOSPHORUS
PHOSPHORUS: 1.9 mg/dL — ABNORMAL LOW (ref 2.4–5.1)
PHOSPHORUS: 3.9 mg/dL (ref 2.4–5.1)

## 2024-04-08 MED ADMIN — sodium chloride (NS) 0.9 % flush 10 mL: 10 mL | INTRAVENOUS | @ 01:00:00

## 2024-04-08 MED ADMIN — sodium chloride (NS) 0.9 % flush 10 mL: 10 mL | INTRAVENOUS | @ 16:00:00

## 2024-04-08 MED ADMIN — sodium chloride (NS) 0.9 % flush 10 mL: 10 mL | INTRAVENOUS | @ 07:00:00

## 2024-04-08 MED ADMIN — rifAXIMin (XIFAXAN) oral suspension: 550 mg | GASTROENTERAL | @ 03:00:00 | Stop: 2024-04-12

## 2024-04-08 MED ADMIN — rifAXIMin (XIFAXAN) oral suspension: 550 mg | GASTROENTERAL | @ 13:00:00 | Stop: 2024-04-12

## 2024-04-08 MED ADMIN — folic acid (FOLVITE) tablet 1 mg: 1 mg | GASTROENTERAL | @ 13:00:00

## 2024-04-08 MED ADMIN — thiamine mononitrate (vit B1) tablet 100 mg: 100 mg | GASTROENTERAL | @ 13:00:00

## 2024-04-08 MED ADMIN — cyanocobalamin (vitamin B-12) tablet 1,000 mcg: 1000 ug | GASTROENTERAL | @ 13:00:00

## 2024-04-08 MED ADMIN — lactulose oral solution: 30 g | GASTROENTERAL | @ 13:00:00

## 2024-04-08 MED ADMIN — lactulose oral solution: 30 g | GASTROENTERAL | @ 18:00:00

## 2024-04-08 MED ADMIN — lactulose oral solution: 30 g | GASTROENTERAL | @ 03:00:00

## 2024-04-08 MED ADMIN — potassium chloride (KLOR-CON) packet 40 mEq: 40 meq | ORAL | @ 12:00:00 | Stop: 2024-04-08

## 2024-04-08 MED ADMIN — esomeprazole (NEXIUM) granules 40 mg: 40 mg | GASTROENTERAL | @ 13:00:00

## 2024-04-08 MED ADMIN — multivitamins, therapeutic with minerals tablet 1 tablet: 1 | GASTROENTERAL | @ 13:00:00

## 2024-04-08 MED ADMIN — apixaban (ELIQUIS) tablet 5 mg: 5 mg | GASTROENTERAL | @ 13:00:00

## 2024-04-08 MED ADMIN — apixaban (ELIQUIS) tablet 5 mg: 5 mg | GASTROENTERAL | @ 03:00:00

## 2024-04-08 MED ADMIN — potassium chloride (KLOR-CON) packet 40 mEq: 40 meq | GASTROENTERAL | @ 18:00:00 | Stop: 2024-04-08

## 2024-04-08 MED ADMIN — calcium gluconate in sodium chloride (NS) 0.9% 2 gram/100 mL IVPB 2 g: 2 g | INTRAVENOUS | @ 23:00:00 | Stop: 2024-04-08

## 2024-04-08 MED ADMIN — furosemide (LASIX) injection 80 mg: 80 mg | INTRAVENOUS | @ 16:00:00 | Stop: 2024-04-08

## 2024-04-08 MED ADMIN — fentaNYL (PF) (SUBLIMAZE) injection 25 mcg: 25 ug | INTRAVENOUS | @ 19:00:00 | Stop: 2024-04-11

## 2024-04-08 MED ADMIN — fentaNYL (PF) (SUBLIMAZE) injection 50 mcg: 50 ug | INTRAVENOUS | @ 17:00:00 | Stop: 2024-04-08

## 2024-04-08 MED ADMIN — potassium chloride 20 mEq in 100 mL IVPB Premix: 20 meq | INTRAVENOUS | @ 20:00:00 | Stop: 2024-04-08

## 2024-04-08 MED ADMIN — dexmedeTOMIDine (Precedex) 400 mcg in sodium chloride 0.9% 100 ml (4 mcg/mL) infusion PMB: 0-1.5 ug/kg/h | INTRAVENOUS | @ 20:00:00

## 2024-04-08 MED ADMIN — propofol (DIPRIVAN) infusion 10 mg/mL: 0-50 ug/kg/min | INTRAVENOUS | @ 08:00:00 | Stop: 2024-04-08

## 2024-04-08 MED ADMIN — propofol (DIPRIVAN) infusion 10 mg/mL: 0-50 ug/kg/min | INTRAVENOUS | @ 03:00:00

## 2024-04-08 MED ADMIN — NORepinephrine 8 mg in dextrose 5 % 250 mL (32 mcg/mL) infusion PMB: 0-30 ug/min | INTRAVENOUS | @ 02:00:00

## 2024-04-08 MED ADMIN — NORepinephrine 8 mg in dextrose 5 % 250 mL (32 mcg/mL) infusion PMB: 0-30 ug/min | INTRAVENOUS | @ 21:00:00

## 2024-04-08 MED ADMIN — NORepinephrine bitartrate-D5W 8 mg/250 mL (32 mcg/mL) infusion: INTRAVENOUS | @ 21:00:00 | Stop: 2024-04-08

## 2024-04-08 MED ADMIN — potassium chloride 20 mEq in 100 mL IVPB Premix: 20 meq | INTRAVENOUS | @ 18:00:00 | Stop: 2024-04-08

## 2024-04-08 MED ADMIN — magnesium sulfate 2gm/50mL IVPB: 2 g | INTRAVENOUS | @ 12:00:00 | Stop: 2024-04-08

## 2024-04-08 MED ADMIN — potassium chloride 20 mEq in 100 mL IVPB Premix: 20 meq | INTRAVENOUS | @ 19:00:00 | Stop: 2024-04-08

## 2024-04-08 MED ADMIN — lactated ringers bolus 1,000 mL: 1000 mL | INTRAVENOUS | @ 23:00:00 | Stop: 2024-04-08

## 2024-04-08 MED ADMIN — sodium phosphate 30 mmol in dextrose 5 % 250 mL IVPB: 30 mmol | INTRAVENOUS | @ 13:00:00 | Stop: 2024-04-08

## 2024-04-08 NOTE — Progress Notes (Signed)
 MICU Daily Progress Note     Date of Service: 04/08/2024    Problem List:   Principal Problem:    Bacteremia, escherichia coli  Active Problems:    Alcoholic liver disease (HHS-HCC)    HTN (hypertension)    Depression with anxiety    Class 2 severe obesity with serious comorbidity in adult    Atrial fibrillation    (CMS-HCC)    Pleural effusion    Hypernatremia    Thrombocytopenia    Cirrhosis    (CMS-HCC)    A-fib (CMS-HCC)    Hepatic encephalopathy    (CMS-HCC)      Interval history: [name] is a 78 y.o. female with HFpEF, HTN, Afib on AC, MASLD cirrhosis, originally hospitalized for scheduled TEE/DCCV (aborted d/t LAA clot) c/b AHHRF, encephalopathy and septic shock requiring MICU care, was s/p extubation transferred to [facility] for further management of anticoagulation and hypernatremia. Transferred back to [facility] MICU for closer monitoring for respiratory status, pressor requirement, GI bleed, and management of carotid artery injury.     On 10/24, patient had acute worsening of respiratory status leading requiring intubation. When patient was given sedation for intubation, she coded and promptly had ROSC after a few minutes of CPR. Subsequently, CVC placed in neck, used to draw blood, unclear if given pressors through it, then found  to be in arterial system based on blood gas and imaging, likely right carotid. Line was subsequently removed, and pressure was held for 15 minutes, hemostasis with no hematoma. She was transferred to [facility] MICU for management of this carotid injury, to be evaluated by vascular surgery while also managing her increased vasopressor requirement and respiratory distress.  Has been weaned off pressors since 10/27 at 1 PM. VBG's indicating that she is ready for extubation, however her mentation suggests that she is not able to protect her airway.  She was extubated on 10/31, and mentation slowly improved.  Unfortunately on the morning of 11/2 she had increased lethargy, increased secretions, and she was not protecting her airway.  She was reintubated on 11/2, and required pressor support with norepinephrine and vasopressin after the induction agents.  Self extubated on 11/5. Re intubated on 11/6 due to hypercarbia and increased work of breathing.  We are left with the challenging options regarding the care of [name].    24 hour events:  - Continue lactulose  and rifaximin    - 4th Intubation occurred yesterday, conversation held with daughter regarding next steps.  Shared the possibility of a tracheostomy, as a fifth intubation has high potential to be traumatic and complicated.  Also discussed options of extubating and possibly hospice care.  Daughter opted to think about options and touch base tomorrow.  - Mental status remains waxing and waning  - Responded well to Lasix 80, not diuresing today due to increase in Cystatin C    Neurological   Hepatic encephalopathy  Per chart review at [facility], rapid response called on 10/24 for increased somnolence. At the time, differential included hepatic encephalopathy, toxic metabolic encephalopathy in the setting of shock (possibly sepsis), versus intracranial pathology. She was subsequently intubated. CT head on 10/24 showed no acute intracranial abnormality. Weaned off sedation with slow improvements in neurological status.  While we have continued lactulose  therapy and seeing good response with several bowel movements, her mentation remains poor.  This is concerning for severe ICU delirium.  Meeting was had with daughter, [name], on 11/5.  Described that should intubation be necessary we would  likely be moving towards a tracheostomy as [name] has continuously not been protecting her airway.  As of now [name] wanted to pursue aggressive measures including repeat intubation and potential tracheostomy.  However she stated that she will talk it over with her sister and the rest of her family.  - Continue lactulose , target 3-5 BMs daily.  MRI brain was impeded due to movement, however with we saw was unremarkable.  - Continue lactulose  and rifaximin      Analgesia: No pain issues  RASS at goal? N/A, not on sedation  [place] Agitation Assessment Scale (RASS) : +1 (04/08/2024  1:30 PM)    Pulmonary   Acute Hypoxic Hypercarbic Respiratory Failure   Rapid response called on 10/24 for increased somnolence, found to have acute hypercarbic respiratory failure and subsequently intubated. Suspect hypercarbic respiratory failure due to encephalopathy as above. CXR on 10/24 showed worsening pleural effusions. Not currently getting diuresed. Given patient's hypoxia, and AMS, there was concern for infectious etiology. MRSA nares negative. Infectious workup thus far is unremarkable. SBTs show readiness extubation based on VBGs, however there is concern that she continues to be unable to protect her airway due to her current mentation.  Reintubated on 11/2 followed by therapeutic bronchoscopy.  Self extubated on 11/5.  Reintubate noted on 11/6 due to hypercarbia and not protecting her airway.      Cardiovascular   Carotid Artery Injury (resolved)  At [facility], CVC placed intended for RIJ on 10/24 following intubation, PEA, ROSC. Was used with known blood return. Unclear if it was used for pressor support, but highly likely that it was. CXR for placement check showed concern for intra aterial location, and blood gas confirmed it was arterial. Thought to be in carotid artery. CVC removed with pressure held for 15 minutes, with no hematoma formation. On arrival, no physical exam and bedside ultrasound showed no large hematoma. No active bleeding.   - Vascular Surgery consulted, stated fistula has healed based on Ultrasound findings    PEA arrest s/p ROSC  Hypovolemic/Hemorrhagic shock (resolved)  Atrial fibrillation  known left atrial appendage clot  HFpEF  Patient with PEA arrest peri-intubation with ROSC. Shock thought to be hemorrhagic in the setting of GI bleed History of atrial fibrillation with known LAA clot and HFpEF. Vasopressors were weaned off 10/27. APTT was elevated the morning of 10/28 necessitating changing anticoagulation from therapeutic heparin to therapeutic Lovenox .   - Holding Coreg   - Continue home Eliquis for anticoagulation for LAA clot    Renal   Hypernatremia (resolved)  At [facility], had persistently hypernatremia near 153. Had been treated with D5 gtt. Sodium throughout admission has largely been 146-148. Sodium was 151 morning of 10/28, presume this will decrease once we start tube feeds.  Sodium has overall normalized and continues to be within normal limits.  - Strict I/O's  - Sodium check q6hours   - FWF with TF, adjusting as needed    Infectious Disease/Autoimmune   E. Coli Bacteremia (resolved),   Initially admitted for afib, found to have E. coli bacteremia on October 10. This was treated with cefazolin . Rapid response called 10/24, patient noted to be hypothermic. WBCs jumped from 3.0 to 9.7 on 10/24. Although the most recent lab was following intubation and CPR. UA with pyuria, rare bacteria, hematuria. Peri-intubation, patient developed significant hypotension requiring initiation of NE and vasopressin. Suspect possible septic shock with unknown infectious source. Patient started on vancomycin and cefepime 10/24. CT Abdomen pelvis concerning for aspiration pneumonia. Following infectious  workup was unremarkable. Antibiotics were stopped on 10/27.  Infectious workup was repeated on 11/3 due to reintubation and status post bronchoscopy.  - follow blood, urine cultures to final, so far no growth to date    Cultures:  Blood Culture, Routine (no units)   Date Value   04/04/2024 No Growth at 4 days   04/04/2024 No Growth at 4 days     Urine Culture, Comprehensive (no units)   Date Value   04/04/2024 NO GROWTH   03/25/2024 <10,000 CFU/mL     Lower Respiratory Culture (no units)   Date Value   04/04/2024 OROPHARYNGEAL FLORA ISOLATED   03/29/2024 Specimen Not Processed WBC (10*9/L)   Date Value   04/08/2024 6.1     WBC, UA (/HPF)   Date Value   03/25/2024 25 (H)       FEN/GI   Sigmoid Mass c/f Malignancy and Hematochezia (resolved)  Hemorrhagic Shock (resolved)  CT abdomen pelvis done 10/24 with evidence of cirrhosis and masslike thickening of the lower sigmoid colon concerning for malignancy.  CTA abdomen pelvis performed 1024 with active extravasation into the gastric fundus possibly secondary to traumatic enteric tube placement.  GI scoped the patient on 10/25 and clipped an area of active bleeding.  They removed a total of 1.5 L of blood.  Hemoglobin 10/20 6 AM downtrended to 6.0, so 2 units of blood transfused.  GI stated this was consistent with 10/25 scope and likely did not represent increased bleeding. Hb steadily improving with Hb of 9.2 on 10/28.   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 Given shock of unclear etiology. CTAP demonstrating cirrhosis. Likely that cirrhosis is contributing to hypotension.   - Continue lactulose ; target 3-5 BMs daily  - NGT in place, on TF and FWF  - Daily MELD labs and CMP     MELD 3.0: 15 at 04/05/2024  4:31 AM  MELD-Na: 13 at 04/05/2024  4:31 AM  Calculated from:  Serum Creatinine: 0.43 mg/dL (Using min of 1 mg/dL) at 88/09/7972  5:68 AM  Serum Sodium: 143 mmol/L (Using max of 137 mmol/L) at 04/05/2024  4:31 AM  Total Bilirubin: 1.3 mg/dL at 88/09/7972  5:68 AM  Serum Albumin: 2.4 g/dL at 88/09/7972  5:68 AM  INR(ratio): 1.61 at 04/03/2024 10:26 AM  Age at listing (hypothetical): 78 years  Sex: Female at 04/05/2024  4:31 AM    Provider Malnutrition Assessment:  Body mass index is 39.22 kg/m??. BMI Interpretation: >/= 30 and < 40, consistent with obesity, clinically significant requiring additional resources and complicating multiple aspects of patient care.  GLIM criteria:   Pt does not meet criteria  -I have screened this patient for malnutrition and they did NOT meet criteria for malnutrition based on GLIM criteria.  -TF and FWF; Dietician consulted, appreciate assistance    Heme/Coag   Acute blood loss anemia  Hemoglobin down trended to 6.0 on the morning of 10/26 thought to be secondary to acute GI bleed that was scoped and clipped 10/25. Patient received 2 units PRBC 10/26 and an additional unit 10/27. Hb steadily improving with Hb of 9.2 on 10/28.   - Continue to follow hemoglobin    Endocrine   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)   - Continue  home anastrozole     Integumentary   NAI   #  - WOCN consulted for high risk skin assessment No. Reason: Not indicated.    Prophylaxis/LDA/Restraints/Consults   ICU Checklist completed: yes (see ICU rounding navigator in Epic)    Patient Lines/Drains/Airways Status       Active Active Lines, Drains, & Airways       Name Placement date Placement time Site Days    ETT  7.5 04/07/24  1259  -- 1    NG/OG Tube Feedings 10 Fr. Right nostril 04/01/24  1138  Right nostril  7    Urethral Catheter Temperature probe 16 Fr. 04/03/24  0830  Temperature probe  5    PICC Double Lumen 04/06/24 Left Basilic 04/06/24  1558  Basilic  1    Arterial Line 04/03/24 Right Radial 04/03/24  0830  Radial  5                  Patient Lines/Drains/Airways Status       Active Wounds       Name Placement date Placement time Site Days    Wound 03/01/24 Other (Comment) Toe (Comment which one) Left;Other (Comment) Blister present on arrival, tx already in outpatient setting, in process of healing, surrounding erythema 03/01/24  0129  Toe (Comment which one)  38    Wound 03/13/24 Irritant Contact Dermatitis Incontinence Sacrum Mid gluteal cleft MASD? 03/13/24  --  Sacrum  26                  Goals of Care     Code Status:   Orders Placed This Encounter   Procedures    Full Code     Standing Status:   Standing     Number of Occurrences:   1        Designated Healthcare Decision Maker:  Ms. Overholt designated healthcare decision maker(s) is/are   HCDM (patient stated preference): Wambold,[name] - Spouse - [phone]    HCDM, First AlternateDestyne, [place]au - Daughter - [phone]. See HCDM section of Epic sidebar/storyboard or ACP tab in patient chart for details regarding active HCDMs and patient capacity for decision-making.      Subjective     Patient intubated and sedated this morning on my exam    Objective     Vitals - past 24 hours  Pulse:  [77-124] 102  SpO2 Pulse:  [78-123] 103  Resp:  [8-29] 15  FiO2 (%):  [30 %-35 %] 30 %  SpO2:  [92 %-100 %] 99 % Intake/Output  I/O last 3 completed shifts:  In: 2191.2 [P.O.:60; I.V.:1261.2; NG/GT:820; IV Piggyback:50]  Out: 3365 [Urine:2305; Stool:1060]     Physical Exam:    General: Sedated and intubated on my examination  HEENT: normocephalic, atraumatic   CV: pulses palpable, regular borderline tachycardia   Pulm: Rhonchi w transmitted upper airway sounds throughout  GI: soft, NTND, + BS  MSK: improving edema noted to her knees bilaterally, no clubbing or cyanosis  Skin: numerous large flat violaceous ecchymosis on left arm, chest, neck bilaterally.   Neuro: Withdrawals to painful stimuli. Responding to name and reliably following commands.     Continuous Infusions:   Infusions Meds[1]    Scheduled Medications:   Scheduled Medications[2]    PRN medications:  PRN Medications[3]    Data/Imaging Review: Reviewed in Epic and personally interpreted on 04/08/2024. See EMR for detailed results.        Loriann Bosserman S Kenshawn Maciolek, MD  PGY1 Internal  Medicine, [facility]                         [1]    NORepinephrine bitartrate-NS Stopped (04/08/24 0300)   [2]    [Provider Hold] apixaban  5 mg Enteral tube: gastric BID    cyanocobalamin (vitamin B-12)  1,000 mcg Enteral tube: gastric Daily    [START ON 04/09/2024] enoxaparin (LOVENOX) injection  40 mg Subcutaneous Q24H SCH esomeprazole  40 mg Enteral tube: gastric Daily    flu vac 2025 [id number](PF)  0.5 mL Intramuscular During hospitalization    folic acid   1 mg Enteral tube: gastric Daily    lactulose   30 g Enteral tube: gastric TID    multivitamins (ADULT)  1 tablet Enteral tube: gastric Daily    potassium chloride  in water   20 mEq Intravenous Once    Followed by    potassium chloride  in water   20 mEq Intravenous Once    Followed by    potassium chloride  in water   20 mEq Intravenous Once    rifAXIMin   550 mg Enteral tube: gastric BID    sodium chloride   10 mL Intravenous Q8H    sodium chloride   10 mL Intravenous Q8H    sodium phosphate  30 mmol Intravenous Once    thiamine  mononitrate (vit B1)  100 mg Enteral tube: gastric Daily   [3] acetaminophen , docusate sodium, fentaNYL  (PF) **OR** [DISCONTINUED] fentaNYL  (PF)

## 2024-04-08 NOTE — Plan of Care (Signed)
 Shift Summary  Cognitive status remained difficult to assess, with persistent neurological exceptions and fluctuating levels of consciousness and agitation throughout the shift.  PRN fentaNYL  was administered twice for agitation, and dexmedeTOMIDine was given later in the shift to prevent self extubation.  Blood cultures drawn during the shift reported no growth at 4 days.  Multiple electrolyte replacements and diuretic therapy were provided as indicated by laboratory results.  Overall, neurological and cognitive function remained impaired.    Optimal Cognitive Function: Throughout the shift, cognition was unable to be assessed and orientation remained unchanged, with persistent exceptions to neurological baseline and a Glasgow Coma Scale score of 9; periods of altered consciousness were noted. RASS fluctuated from moderate sedation to restless and alert/calm, and no verbal response was observed at any time.

## 2024-04-08 NOTE — Consults (Signed)
 WOCN Consult Services  ADVANCED FOOT AND NAIL CARE CONSULT     Reason For Consult:  - Initial  - Nail Care    Assessment:  Received order for [facility] to complete Advanced Foot and Nail Care Service.  RN reports that she has not been able to independently cut toenails in awhile.    The patient has never been to a Podiatrist.  The RN is agreeable to [facility] completing nail care.    Nail Assessment:  -  Finger Nail:  unusually long and thick  -  Toe Nail:  unusually long, thick, and jagged  -  No Ulcerations, foot deformities, or other nail concerns noted.     Procedure:  -  Patient in need of toenail cleaning and cutting.  The patient requested fingernail trimming also.  -  All debris removed from below nail surface with cuticle stick, nails cut with sterile clippers, then nails filed with nail file.  -  Hands and feet cleaned and moisturizer applied.  -  Patient tolerated the procedure well    Instruments Used:  -  Sterile nail clippers  -  100/180 grit nail file  -  cuticle stick    Plan:  -  Instructed the RN the risk of ulceration, infection, and trauma with overgrown nails. Reviewed the importance of following up with [facility], Podiatrist or PCP to continue to maintain nail care/length.    Workup Time:  45 minutes      Lonni Lunger, RN, BSN, [place], [facility]

## 2024-04-08 NOTE — Plan of Care (Signed)
 Patient remains vented PRVC mode @ 30%. Weaned VT to 6 ml/kg this AM. Tolerating change well. Post ABG within acceptable limits. No desat episodes noted. Patent and secured airway maintained.         Problem: Mechanical Ventilation Invasive  Goal: Optimal Device Function  Intervention: Optimize Device Care and Function  Recent Flowsheet Documentation  Taken 04/08/2024 1711 by Theophilus Lila Hadassah JAYSON, RRT  Oral Care:   suction provided   teeth brushed   tongue brushed   patient refused intervention   mouth swabbed   oral rinse provided  Taken 04/08/2024 0835 by Theophilus Lila Hadassah JAYSON, RRT  Oral Care:   tongue brushed   teeth brushed   suction provided   oral rinse provided   mouth swabbed  Goal: Absence of Ventilator-Induced Lung Injury  Intervention: Facilitate Lung-Protection Measures  Recent Flowsheet Documentation  Taken 04/08/2024 1711 by Theophilus Lila Hadassah JAYSON, RRT  Lung Protection Measures:   plateau/inspiratory pressure monitored   ventilator synchrony promoted   low tidal volume provided   lung compliance monitored   ventilator waveforms monitored  Intervention: Prevent Ventilator-Associated Pneumonia  Recent Flowsheet Documentation  Taken 04/08/2024 1711 by Theophilus Lila Hadassah JAYSON, RRT  Head of Bed [facility]) Positioning: HOB at 30-45 degrees  VAP Prevention Bundle:   HOB elevation maintained   vent circuit breaks minimized   spontaneous breathing trial performed   readiness to extubate assessed   oral care regularly provided  VAP Prevention Measures: completed  Oral Care:   suction provided   teeth brushed   tongue brushed   patient refused intervention   mouth swabbed   oral rinse provided  Taken 04/08/2024 1350 by Theophilus Lila Hadassah JAYSON, RRT  Head of Bed [facility]) Positioning: HOB at 30-45 degrees  Taken 04/08/2024 0835 by Theophilus Lila Hadassah JAYSON, RRT  Head of Bed [facility]) Positioning: HOB at 30-45 degrees  Oral Care:   tongue brushed   teeth brushed   suction provided   oral rinse provided   mouth swabbed Problem: Mechanical Ventilation Invasive  Goal: Effective Communication  Outcome: Ongoing - Unchanged  Goal: Optimal Device Function  Outcome: Ongoing - Unchanged  Intervention: Optimize Device Care and Function  Recent Flowsheet Documentation  Taken 04/08/2024 1711 by Theophilus Lila Hadassah JAYSON, RRT  Oral Care:   suction provided   teeth brushed   tongue brushed   patient refused intervention   mouth swabbed   oral rinse provided  Taken 04/08/2024 0835 by Theophilus Lila Hadassah JAYSON, RRT  Oral Care:   tongue brushed   teeth brushed   suction provided   oral rinse provided   mouth swabbed  Goal: Mechanical Ventilation Liberation  Outcome: Ongoing - Unchanged  Goal: Optimal Nutrition Delivery  Outcome: Ongoing - Unchanged  Goal: Absence of Device-Related Skin and Tissue Injury  Outcome: Ongoing - Unchanged  Goal: Absence of Ventilator-Induced Lung Injury  Outcome: Ongoing - Unchanged  Intervention: Facilitate Lung-Protection Measures  Flowsheets (Taken 04/08/2024 1711)  Lung Protection Measures:   plateau/inspiratory pressure monitored   ventilator synchrony promoted   low tidal volume provided   lung compliance monitored   ventilator waveforms monitored  Intervention: Prevent Ventilator-Associated Pneumonia  Flowsheets  Taken 04/08/2024 1711  Head of Bed (HOB) Positioning: HOB at 30-45 degrees  VAP Prevention Bundle:   HOB elevation maintained   vent circuit breaks minimized   spontaneous breathing trial performed   readiness to extubate assessed   oral care regularly provided  VAP Prevention Measures: completed  Oral Care:   suction provided   teeth brushed   tongue brushed   patient refused intervention   mouth swabbed   oral rinse provided  Taken 04/08/2024 1350  Head of Bed (HOB) Positioning: HOB at 30-45 degrees  Taken 04/08/2024 0835  Head of Bed (HOB) Positioning: HOB at 30-45 degrees  Oral Care:   tongue brushed   teeth brushed   suction provided   oral rinse provided   mouth swabbed

## 2024-04-08 NOTE — Plan of Care (Signed)
 Problem: Mechanical Ventilation Invasive  Goal: Effective Communication  Outcome: Ongoing - Unchanged  Goal: Optimal Device Function  Outcome: Ongoing - Unchanged  Intervention: Optimize Device Care and Function  Recent Flowsheet Documentation  Taken 04/08/2024 0214 by Jolaine Rosaline CROME, RRT  Airway/Ventilation Management: humidification applied  Oral Care: mouth swabbed  Taken 04/07/2024 2058 by Jolaine Rosaline CROME, RRT  Airway/Ventilation Management: humidification applied  Oral Care: mouth swabbed  Goal: Mechanical Ventilation Liberation  Outcome: Ongoing - Unchanged  Goal: Absence of Device-Related Skin and Tissue Injury  Outcome: Ongoing - Unchanged  Goal: Absence of Ventilator-Induced Lung Injury  Outcome: Ongoing - Unchanged  Intervention: Prevent Ventilator-Associated Pneumonia  Recent Flowsheet Documentation  Taken 04/08/2024 0214 by Jolaine Rosaline CROME, RRT  Head of Bed [facility]) Positioning: HOB at 30-45 degrees  Oral Care: mouth swabbed  Taken 04/07/2024 2058 by Jolaine Rosaline CROME, RRT  Head of Bed [facility]) Positioning: HOB at 30-45 degrees  Oral Care: mouth swabbed

## 2024-04-09 LAB — BLOOD GAS CRITICAL CARE PANEL, ARTERIAL
BASE EXCESS ARTERIAL: 7.2 — ABNORMAL HIGH (ref -2.0–2.0)
BASE EXCESS ARTERIAL: 3.8 — ABNORMAL HIGH (ref -2.0–2.0)
BASE EXCESS ARTERIAL: 3.8 — ABNORMAL HIGH (ref -2.0–2.0)
BASE EXCESS ARTERIAL: 4.8 — ABNORMAL HIGH (ref -2.0–2.0)
CALCIUM IONIZED ARTERIAL (MG/DL): 4.37 mg/dL — ABNORMAL LOW (ref 4.40–5.40)
CALCIUM IONIZED ARTERIAL (MG/DL): 4.55 mg/dL (ref 4.40–5.40)
CALCIUM IONIZED ARTERIAL (MG/DL): 4.69 mg/dL (ref 4.40–5.40)
CALCIUM IONIZED ARTERIAL (MG/DL): 4.69 mg/dL (ref 4.40–5.40)
CARBOXYHEMOGLOBIN: 2.3 % — ABNORMAL HIGH (ref <1.2)
CARBOXYHEMOGLOBIN: 2.5 % — ABNORMAL HIGH (ref <1.2)
CARBOXYHEMOGLOBIN: 2.5 % — ABNORMAL HIGH (ref <1.2)
CARBOXYHEMOGLOBIN: 2.5 % — ABNORMAL HIGH (ref <1.2)
CHLORIDE, WHOLE BLOOD: 118 mmol/L — ABNORMAL HIGH (ref 98–107)
CHLORIDE, WHOLE BLOOD: 114 mmol/L — ABNORMAL HIGH (ref 98–107)
CHLORIDE, WHOLE BLOOD: 114 mmol/L — ABNORMAL HIGH (ref 98–107)
CHLORIDE, WHOLE BLOOD: 116 mmol/L — ABNORMAL HIGH (ref 98–107)
GLUCOSE WHOLE BLOOD: 177 mg/dL (ref 70–179)
GLUCOSE WHOLE BLOOD: 187 mg/dL — ABNORMAL HIGH (ref 70–179)
GLUCOSE WHOLE BLOOD: 146 mg/dL (ref 70–179)
GLUCOSE WHOLE BLOOD: 154 mg/dL (ref 70–179)
HCO3 ARTERIAL: 31 mmol/L — ABNORMAL HIGH (ref 22–27)
HCO3 ARTERIAL: 27 mmol/L (ref 22–27)
HCO3 ARTERIAL: 27 mmol/L (ref 22–27)
HCO3 ARTERIAL: 28 mmol/L — ABNORMAL HIGH (ref 22–27)
HEMOGLOBIN BLOOD GAS: 11.8 g/dL — ABNORMAL LOW (ref 12.00–16.00)
HEMOGLOBIN BLOOD GAS: 8.1 g/dL — ABNORMAL LOW (ref 12.00–16.00)
HEMOGLOBIN BLOOD GAS: 8.3 g/dL — ABNORMAL LOW (ref 12.00–16.00)
HEMOGLOBIN BLOOD GAS: 7.8 g/dL — ABNORMAL LOW (ref 12.00–16.00)
LACTATE BLOOD ARTERIAL: 1.4 mmol/L — ABNORMAL HIGH (ref <1.3)
LACTATE BLOOD ARTERIAL: 1.8 mmol/L — ABNORMAL HIGH (ref <1.3)
LACTATE BLOOD ARTERIAL: 2 mmol/L — ABNORMAL HIGH (ref <1.3)
LACTATE BLOOD ARTERIAL: 1.3 mmol/L — ABNORMAL HIGH (ref <1.3)
METHEMOGLOBIN: <1 % (ref <1.5)
METHEMOGLOBIN: <1 % (ref <1.5)
METHEMOGLOBIN: <1 % (ref <1.5)
METHEMOGLOBIN: <1 % (ref <1.5)
O2 SATURATION ARTERIAL: 98.7 % (ref 94.0–100.0)
O2 SATURATION ARTERIAL: 99.3 % (ref 94.0–100.0)
O2 SATURATION ARTERIAL: 99 % (ref 94.0–100.0)
O2 SATURATION ARTERIAL: 98.6 % (ref 94.0–100.0)
OXYHEMOGLOBIN: 96.2 % (ref 94.0–100.0)
OXYHEMOGLOBIN: 96.1 % (ref 94.0–100.0)
OXYHEMOGLOBIN: 95.9 % (ref 94.0–100.0)
OXYHEMOGLOBIN: 95.3 % (ref 94.0–100.0)
PCO2 ARTERIAL: 37.9 mmHg (ref 35.0–45.0)
PCO2 ARTERIAL: 34 mmHg — ABNORMAL LOW (ref 35.0–45.0)
PCO2 ARTERIAL: 34.8 mmHg — ABNORMAL LOW (ref 35.0–45.0)
PCO2 ARTERIAL: 38.2 mmHg (ref 35.0–45.0)
PH ARTERIAL: 7.52 — ABNORMAL HIGH (ref 7.35–7.45)
PH ARTERIAL: 7.51 — ABNORMAL HIGH (ref 7.35–7.45)
PH ARTERIAL: 7.5 — ABNORMAL HIGH (ref 7.35–7.45)
PH ARTERIAL: 7.48 — ABNORMAL HIGH (ref 7.35–7.45)
PO2 ARTERIAL: 98 mmHg (ref 80.0–110.0)
PO2 ARTERIAL: 103 mmHg (ref 80.0–110.0)
PO2 ARTERIAL: 100 mmHg (ref 80.0–110.0)
PO2 ARTERIAL: 95.8 mmHg (ref 80.0–110.0)
POTASSIUM WHOLE BLOOD: 3.6 mmol/L (ref 3.4–4.6)
POTASSIUM WHOLE BLOOD: 3.7 mmol/L (ref 3.4–4.6)
POTASSIUM WHOLE BLOOD: 3.5 mmol/L (ref 3.4–4.6)
POTASSIUM WHOLE BLOOD: 3.2 mmol/L — ABNORMAL LOW (ref 3.4–4.6)
SODIUM WHOLE BLOOD: 138 mmol/L (ref 135–145)
SODIUM WHOLE BLOOD: 146 mmol/L — ABNORMAL HIGH (ref 135–145)
SODIUM WHOLE BLOOD: 145 mmol/L (ref 135–145)
SODIUM WHOLE BLOOD: 145 mmol/L (ref 135–145)

## 2024-04-09 LAB — URINALYSIS WITH MICROSCOPY
BACTERIA: NONE SEEN /HPF
BILIRUBIN UA: NEGATIVE
GLUCOSE UA: NEGATIVE
HYALINE CASTS: 3 /LPF — ABNORMAL HIGH (ref 0–1)
KETONES UA: NEGATIVE
NITRITE UA: NEGATIVE
PH UA: 5.5 (ref 5.0–9.0)
PROTEIN UA: NEGATIVE
RBC UA: 24 /HPF — ABNORMAL HIGH (ref <=4)
SPECIFIC GRAVITY UA: 1.02 (ref 1.003–1.030)
SQUAMOUS EPITHELIAL: 2 /HPF (ref 0–5)
UROBILINOGEN UA: <2
WBC UA: 61 /HPF — ABNORMAL HIGH (ref 0–5)

## 2024-04-09 LAB — BASIC METABOLIC PANEL
ANION GAP: 11 mmol/L (ref 5–14)
BLOOD UREA NITROGEN: 18 mg/dL (ref 9–23)
BUN / CREAT RATIO: 36
CALCIUM: 8.3 mg/dL — ABNORMAL LOW (ref 8.7–10.4)
CHLORIDE: 108 mmol/L — ABNORMAL HIGH (ref 98–107)
CO2: 30 mmol/L (ref 20.0–31.0)
CREATININE: 0.5 mg/dL — ABNORMAL LOW (ref 0.55–1.02)
EGFR CKD-EPI (2021) FEMALE: >90 mL/min/1.73m2 (ref >=60)
GLUCOSE RANDOM: 173 mg/dL (ref 70–179)
POTASSIUM: 4 mmol/L (ref 3.4–4.8)
SODIUM: 149 mmol/L — ABNORMAL HIGH (ref 135–145)

## 2024-04-09 LAB — CBC W/ AUTO DIFF
BASOPHILS ABSOLUTE COUNT: 0.1 10*9/L (ref 0.0–0.1)
BASOPHILS RELATIVE PERCENT: 0.6 %
EOSINOPHILS ABSOLUTE COUNT: 0.2 10*9/L (ref 0.0–0.5)
EOSINOPHILS RELATIVE PERCENT: 2.3 %
HEMATOCRIT: 26.3 % — ABNORMAL LOW (ref 34.0–44.0)
HEMOGLOBIN: 8.8 g/dL — ABNORMAL LOW (ref 11.3–14.9)
LYMPHOCYTES ABSOLUTE COUNT: 0.7 10*9/L — ABNORMAL LOW (ref 1.1–3.6)
LYMPHOCYTES RELATIVE PERCENT: 7.9 %
MEAN CORPUSCULAR HEMOGLOBIN CONC: 33.5 g/dL (ref 32.0–36.0)
MEAN CORPUSCULAR HEMOGLOBIN: 33 pg — ABNORMAL HIGH (ref 25.9–32.4)
MEAN CORPUSCULAR VOLUME: 98.6 fL — ABNORMAL HIGH (ref 77.6–95.7)
MEAN PLATELET VOLUME: 8.5 fL (ref 6.8–10.7)
MONOCYTES ABSOLUTE COUNT: 1.1 10*9/L — ABNORMAL HIGH (ref 0.3–0.8)
MONOCYTES RELATIVE PERCENT: 12.7 %
NEUTROPHILS ABSOLUTE COUNT: 6.6 10*9/L (ref 1.8–7.8)
NEUTROPHILS RELATIVE PERCENT: 76.5 %
PLATELET COUNT: 162 10*9/L (ref 150–450)
RED BLOOD CELL COUNT: 2.67 10*12/L — ABNORMAL LOW (ref 3.95–5.13)
RED CELL DISTRIBUTION WIDTH: 23.3 % — ABNORMAL HIGH (ref 12.2–15.2)
WBC ADJUSTED: 8.6 10*9/L (ref 3.6–11.2)

## 2024-04-09 LAB — HEPATIC FUNCTION PANEL
ALBUMIN: 2.1 g/dL — ABNORMAL LOW (ref 3.4–5.0)
ALKALINE PHOSPHATASE: 168 U/L — ABNORMAL HIGH (ref 46–116)
ALT (SGPT): 18 U/L (ref 10–49)
AST (SGOT): 28 U/L (ref <=34)
BILIRUBIN DIRECT: 0.6 mg/dL — ABNORMAL HIGH (ref 0.00–0.30)
BILIRUBIN TOTAL: 1.1 mg/dL (ref 0.3–1.2)
PROTEIN TOTAL: 5.2 g/dL — ABNORMAL LOW (ref 5.7–8.2)

## 2024-04-09 LAB — MAGNESIUM: MAGNESIUM: 1.9 mg/dL (ref 1.6–2.6)

## 2024-04-09 LAB — PHOSPHORUS: PHOSPHORUS: 3 mg/dL (ref 2.4–5.1)

## 2024-04-09 LAB — CYSTATIN C
CYSTATIN C: 1.51 mg/L — ABNORMAL HIGH (ref 0.64–1.23)
EGFR CKD-EPI (2012) CYSTATIN C FEMALE: 39 mL/min/1.73m2 — ABNORMAL LOW (ref >=60)

## 2024-04-09 MED ADMIN — sodium chloride (NS) 0.9 % flush 10 mL: 10 mL | INTRAVENOUS

## 2024-04-09 MED ADMIN — sodium chloride (NS) 0.9 % flush 10 mL: 10 mL | INTRAVENOUS | @ 16:00:00

## 2024-04-09 MED ADMIN — sodium chloride (NS) 0.9 % flush 10 mL: 10 mL | INTRAVENOUS | @ 09:00:00

## 2024-04-09 MED ADMIN — rifAXIMin (XIFAXAN) oral suspension: 550 mg | GASTROENTERAL | @ 03:00:00 | Stop: 2024-04-12

## 2024-04-09 MED ADMIN — folic acid (FOLVITE) tablet 1 mg: 1 mg | GASTROENTERAL | @ 14:00:00

## 2024-04-09 MED ADMIN — thiamine mononitrate (vit B1) tablet 100 mg: 100 mg | GASTROENTERAL | @ 15:00:00

## 2024-04-09 MED ADMIN — haloperidol LACTATE (HALDOL) injection 5 mg: 5 mg | INTRAVENOUS

## 2024-04-09 MED ADMIN — albumin human 25 % 50 g: 50 g | INTRAVENOUS | @ 23:00:00 | Stop: 2024-04-09

## 2024-04-09 MED ADMIN — cyanocobalamin (vitamin B-12) tablet 1,000 mcg: 1000 ug | GASTROENTERAL | @ 14:00:00

## 2024-04-09 MED ADMIN — lactulose oral solution: 30 g | GASTROENTERAL | @ 02:00:00

## 2024-04-09 MED ADMIN — lactulose oral solution: 30 g | GASTROENTERAL | @ 14:00:00 | Stop: 2024-04-09

## 2024-04-09 MED ADMIN — lactulose oral solution: 20 g | GASTROENTERAL | @ 20:00:00

## 2024-04-09 MED ADMIN — enoxaparin (LOVENOX) syringe 40 mg: 40 mg | SUBCUTANEOUS | @ 14:00:00

## 2024-04-09 MED ADMIN — esomeprazole (NEXIUM) granules 40 mg: 40 mg | GASTROENTERAL | @ 14:00:00

## 2024-04-09 MED ADMIN — multivitamins, therapeutic with minerals tablet 1 tablet: 1 | GASTROENTERAL | @ 14:00:00

## 2024-04-09 MED ADMIN — haloperidol LACTATE (HALDOL) injection 2 mg: 2 mg | INTRAVENOUS | @ 17:00:00 | Stop: 2024-04-09

## 2024-04-09 MED ADMIN — fentaNYL (PF) (SUBLIMAZE) injection 25 mcg: 25 ug | INTRAVENOUS | @ 21:00:00 | Stop: 2024-04-09

## 2024-04-09 MED ADMIN — fentaNYL (PF) (SUBLIMAZE) injection 25 mcg: 25 ug | INTRAVENOUS | @ 22:00:00 | Stop: 2024-04-09

## 2024-04-09 MED ADMIN — dexmedeTOMIDine (Precedex) 400 mcg in sodium chloride 0.9% 100 ml (4 mcg/mL) infusion PMB: 0-1.5 ug/kg/h | INTRAVENOUS | @ 03:00:00

## 2024-04-09 MED ADMIN — dexmedeTOMIDine (Precedex) 400 mcg in sodium chloride 0.9% 100 ml (4 mcg/mL) infusion PMB: 0-1.5 ug/kg/h | INTRAVENOUS | @ 16:00:00

## 2024-04-09 MED ADMIN — magnesium oxide (MAG-OX) tablet 400 mg: 400 mg | GASTROENTERAL | @ 14:00:00 | Stop: 2024-04-09

## 2024-04-09 MED ADMIN — calcium gluconate in sodium chloride (NS) 0.9% 2 gram/100 mL IVPB 2 g: 2 g | INTRAVENOUS | @ 14:00:00 | Stop: 2024-04-09

## 2024-04-09 MED ADMIN — haloperidol LACTATE (HALDOL) injection 2 mg: 2 mg | INTRAVENOUS | @ 23:00:00 | Stop: 2024-04-09

## 2024-04-10 LAB — BLOOD GAS CRITICAL CARE PANEL, ARTERIAL
BASE EXCESS ARTERIAL: 5.5 — ABNORMAL HIGH (ref -2.0–2.0)
BASE EXCESS ARTERIAL: 5.2 — ABNORMAL HIGH (ref -2.0–2.0)
BASE EXCESS ARTERIAL: 6.2 — ABNORMAL HIGH (ref -2.0–2.0)
BASE EXCESS ARTERIAL: 4.3 — ABNORMAL HIGH (ref -2.0–2.0)
CALCIUM IONIZED ARTERIAL (MG/DL): 4.79 mg/dL (ref 4.40–5.40)
CALCIUM IONIZED ARTERIAL (MG/DL): 4.76 mg/dL (ref 4.40–5.40)
CALCIUM IONIZED ARTERIAL (MG/DL): 4.54 mg/dL (ref 4.40–5.40)
CALCIUM IONIZED ARTERIAL (MG/DL): 4.67 mg/dL (ref 4.40–5.40)
CARBOXYHEMOGLOBIN: 2.5 % — ABNORMAL HIGH (ref <1.2)
CARBOXYHEMOGLOBIN: 2.4 % — ABNORMAL HIGH (ref <1.2)
CARBOXYHEMOGLOBIN: 2.5 % — ABNORMAL HIGH (ref <1.2)
CARBOXYHEMOGLOBIN: 2.4 % — ABNORMAL HIGH (ref <1.2)
CHLORIDE, WHOLE BLOOD: 113 mmol/L — ABNORMAL HIGH (ref 98–107)
CHLORIDE, WHOLE BLOOD: 112 mmol/L — ABNORMAL HIGH (ref 98–107)
CHLORIDE, WHOLE BLOOD: 113 mmol/L — ABNORMAL HIGH (ref 98–107)
CHLORIDE, WHOLE BLOOD: 114 mmol/L — ABNORMAL HIGH (ref 98–107)
GLUCOSE WHOLE BLOOD: 166 mg/dL (ref 70–179)
GLUCOSE WHOLE BLOOD: 153 mg/dL (ref 70–179)
GLUCOSE WHOLE BLOOD: 129 mg/dL (ref 70–179)
GLUCOSE WHOLE BLOOD: 166 mg/dL (ref 70–179)
HCO3 ARTERIAL: 30 mmol/L — ABNORMAL HIGH (ref 22–27)
HCO3 ARTERIAL: 29 mmol/L — ABNORMAL HIGH (ref 22–27)
HCO3 ARTERIAL: 30 mmol/L — ABNORMAL HIGH (ref 22–27)
HCO3 ARTERIAL: 28 mmol/L — ABNORMAL HIGH (ref 22–27)
HEMOGLOBIN BLOOD GAS: 7.2 g/dL — ABNORMAL LOW (ref 12.00–16.00)
HEMOGLOBIN BLOOD GAS: 7.1 g/dL — ABNORMAL LOW (ref 12.00–16.00)
HEMOGLOBIN BLOOD GAS: 7.3 g/dL — ABNORMAL LOW (ref 12.00–16.00)
HEMOGLOBIN BLOOD GAS: 7.7 g/dL — ABNORMAL LOW (ref 12.00–16.00)
LACTATE BLOOD ARTERIAL: 1.3 mmol/L — ABNORMAL HIGH (ref <1.3)
LACTATE BLOOD ARTERIAL: 1.3 mmol/L — ABNORMAL HIGH (ref <1.3)
LACTATE BLOOD ARTERIAL: 1.8 mmol/L — ABNORMAL HIGH (ref <1.3)
LACTATE BLOOD ARTERIAL: 1.8 mmol/L — ABNORMAL HIGH (ref <1.3)
METHEMOGLOBIN: <1 % (ref <1.5)
METHEMOGLOBIN: <1 % (ref <1.5)
METHEMOGLOBIN: <1 % (ref <1.5)
METHEMOGLOBIN: <1 % (ref <1.5)
O2 SATURATION ARTERIAL: 99.4 % (ref 94.0–100.0)
O2 SATURATION ARTERIAL: 99.8 % (ref 94.0–100.0)
O2 SATURATION ARTERIAL: 99 % (ref 94.0–100.0)
O2 SATURATION ARTERIAL: 99.7 % (ref 94.0–100.0)
OXYHEMOGLOBIN: 96.5 % (ref 94.0–100.0)
OXYHEMOGLOBIN: 96.8 % (ref 94.0–100.0)
OXYHEMOGLOBIN: 96.2 % (ref 94.0–100.0)
OXYHEMOGLOBIN: 97.1 % (ref 94.0–100.0)
PCO2 ARTERIAL: 43.1 mmHg (ref 35.0–45.0)
PCO2 ARTERIAL: 38.1 mmHg (ref 35.0–45.0)
PCO2 ARTERIAL: 38.4 mmHg (ref 35.0–45.0)
PCO2 ARTERIAL: 38.9 mmHg (ref 35.0–45.0)
PH ARTERIAL: 7.45 (ref 7.35–7.45)
PH ARTERIAL: 7.49 — ABNORMAL HIGH (ref 7.35–7.45)
PH ARTERIAL: 7.5 — ABNORMAL HIGH (ref 7.35–7.45)
PH ARTERIAL: 7.47 — ABNORMAL HIGH (ref 7.35–7.45)
PO2 ARTERIAL: 109 mmHg (ref 80.0–110.0)
PO2 ARTERIAL: 118 mmHg — ABNORMAL HIGH (ref 80.0–110.0)
PO2 ARTERIAL: 105 mmHg (ref 80.0–110.0)
PO2 ARTERIAL: 120 mmHg — ABNORMAL HIGH (ref 80.0–110.0)
POTASSIUM WHOLE BLOOD: 3.3 mmol/L — ABNORMAL LOW (ref 3.4–4.6)
POTASSIUM WHOLE BLOOD: 3.3 mmol/L — ABNORMAL LOW (ref 3.4–4.6)
POTASSIUM WHOLE BLOOD: 3.4 mmol/L (ref 3.4–4.6)
POTASSIUM WHOLE BLOOD: 3.7 mmol/L (ref 3.4–4.6)
SODIUM WHOLE BLOOD: 145 mmol/L (ref 135–145)
SODIUM WHOLE BLOOD: 146 mmol/L — ABNORMAL HIGH (ref 135–145)
SODIUM WHOLE BLOOD: 143 mmol/L (ref 135–145)
SODIUM WHOLE BLOOD: 146 mmol/L — ABNORMAL HIGH (ref 135–145)

## 2024-04-10 LAB — PHOSPHORUS: PHOSPHORUS: 2.9 mg/dL (ref 2.4–5.1)

## 2024-04-10 LAB — BASIC METABOLIC PANEL
ANION GAP: 12 mmol/L (ref 5–14)
BLOOD UREA NITROGEN: 15 mg/dL (ref 9–23)
BUN / CREAT RATIO: 31
CALCIUM: 8.6 mg/dL — ABNORMAL LOW (ref 8.7–10.4)
CHLORIDE: 110 mmol/L — ABNORMAL HIGH (ref 98–107)
CO2: 29 mmol/L (ref 20.0–31.0)
CREATININE: 0.48 mg/dL — ABNORMAL LOW (ref 0.55–1.02)
EGFR CKD-EPI (2021) FEMALE: >90 mL/min/1.73m2 (ref >=60)
GLUCOSE RANDOM: 158 mg/dL (ref 70–179)
POTASSIUM: 3.5 mmol/L (ref 3.4–4.8)
SODIUM: 151 mmol/L — ABNORMAL HIGH (ref 135–145)

## 2024-04-10 LAB — CBC W/ AUTO DIFF
BASOPHILS ABSOLUTE COUNT: 0 10*9/L (ref 0.0–0.1)
BASOPHILS RELATIVE PERCENT: 0.6 %
EOSINOPHILS ABSOLUTE COUNT: 0.1 10*9/L (ref 0.0–0.5)
EOSINOPHILS RELATIVE PERCENT: 2 %
HEMATOCRIT: 23.4 % — ABNORMAL LOW (ref 34.0–44.0)
HEMOGLOBIN: 7.4 g/dL — ABNORMAL LOW (ref 11.3–14.9)
LYMPHOCYTES ABSOLUTE COUNT: 0.7 10*9/L — ABNORMAL LOW (ref 1.1–3.6)
LYMPHOCYTES RELATIVE PERCENT: 13.7 %
MEAN CORPUSCULAR HEMOGLOBIN CONC: 31.8 g/dL — ABNORMAL LOW (ref 32.0–36.0)
MEAN CORPUSCULAR HEMOGLOBIN: 31.9 pg (ref 25.9–32.4)
MEAN CORPUSCULAR VOLUME: 100.4 fL — ABNORMAL HIGH (ref 77.6–95.7)
MEAN PLATELET VOLUME: 8.6 fL (ref 6.8–10.7)
MONOCYTES ABSOLUTE COUNT: 0.7 10*9/L (ref 0.3–0.8)
MONOCYTES RELATIVE PERCENT: 12.3 %
NEUTROPHILS ABSOLUTE COUNT: 3.9 10*9/L (ref 1.8–7.8)
NEUTROPHILS RELATIVE PERCENT: 71.4 %
PLATELET COUNT: 140 10*9/L — ABNORMAL LOW (ref 150–450)
RED BLOOD CELL COUNT: 2.33 10*12/L — ABNORMAL LOW (ref 3.95–5.13)
RED CELL DISTRIBUTION WIDTH: 23 % — ABNORMAL HIGH (ref 12.2–15.2)
WBC ADJUSTED: 5.4 10*9/L (ref 3.6–11.2)

## 2024-04-10 LAB — HEPATIC FUNCTION PANEL
ALBUMIN: 2.6 g/dL — ABNORMAL LOW (ref 3.4–5.0)
ALKALINE PHOSPHATASE: 147 U/L — ABNORMAL HIGH (ref 46–116)
ALT (SGPT): 16 U/L (ref 10–49)
AST (SGOT): 23 U/L (ref <=34)
BILIRUBIN DIRECT: 0.8 mg/dL — ABNORMAL HIGH (ref 0.00–0.30)
BILIRUBIN TOTAL: 1.5 mg/dL — ABNORMAL HIGH (ref 0.3–1.2)
PROTEIN TOTAL: 5.3 g/dL — ABNORMAL LOW (ref 5.7–8.2)

## 2024-04-10 LAB — URINALYSIS WITH MICROSCOPY
BILIRUBIN UA: NEGATIVE
GLUCOSE UA: NEGATIVE
HYALINE CASTS: 38 /LPF — ABNORMAL HIGH (ref 0–1)
KETONES UA: NEGATIVE
LEUKOCYTE ESTERASE UA: NEGATIVE
NITRITE UA: NEGATIVE
PH UA: 5 (ref 5.0–9.0)
PROTEIN UA: NEGATIVE
RBC UA: 25 /HPF — ABNORMAL HIGH (ref <=4)
SPECIFIC GRAVITY UA: 1.016 (ref 1.003–1.030)
SQUAMOUS EPITHELIAL: <1 /HPF (ref 0–5)
UROBILINOGEN UA: <2
WBC UA: 7 /HPF — ABNORMAL HIGH (ref 0–5)

## 2024-04-10 LAB — UREA NITROGEN, URINE: UREA NITROGEN URINE: 666 mg/dL

## 2024-04-10 LAB — HEMOGLOBIN AND HEMATOCRIT, BLOOD
HEMATOCRIT: 24.9 % — ABNORMAL LOW (ref 34.0–44.0)
HEMOGLOBIN: 8.3 g/dL — ABNORMAL LOW (ref 11.3–14.9)

## 2024-04-10 LAB — CYSTATIN C
CYSTATIN C: 1.59 mg/L — ABNORMAL HIGH (ref 0.64–1.23)
EGFR CKD-EPI (2012) CYSTATIN C FEMALE: 37 mL/min/1.73m2 — ABNORMAL LOW (ref >=60)

## 2024-04-10 LAB — CREATININE, URINE: CREATININE, URINE: 62.7 mg/dL

## 2024-04-10 LAB — MAGNESIUM: MAGNESIUM: 1.9 mg/dL (ref 1.6–2.6)

## 2024-04-10 MED ADMIN — sodium chloride (NS) 0.9 % flush 10 mL: 10 mL | INTRAVENOUS | @ 01:00:00

## 2024-04-10 MED ADMIN — sodium chloride (NS) 0.9 % flush 10 mL: 10 mL | INTRAVENOUS | @ 09:00:00

## 2024-04-10 MED ADMIN — rifAXIMin (XIFAXAN) oral suspension: 550 mg | GASTROENTERAL | @ 13:00:00 | Stop: 2024-04-12

## 2024-04-10 MED ADMIN — rifAXIMin (XIFAXAN) oral suspension: 550 mg | GASTROENTERAL | @ 02:00:00 | Stop: 2024-04-12

## 2024-04-10 MED ADMIN — folic acid (FOLVITE) tablet 1 mg: 1 mg | GASTROENTERAL | @ 13:00:00

## 2024-04-10 MED ADMIN — thiamine mononitrate (vit B1) tablet 100 mg: 100 mg | GASTROENTERAL | @ 13:00:00

## 2024-04-10 MED ADMIN — haloperidol LACTATE (HALDOL) injection 5 mg: 5 mg | INTRAVENOUS | @ 16:00:00

## 2024-04-10 MED ADMIN — haloperidol LACTATE (HALDOL) injection 5 mg: 5 mg | INTRAVENOUS | @ 10:00:00

## 2024-04-10 MED ADMIN — haloperidol LACTATE (HALDOL) injection 5 mg: 5 mg | INTRAVENOUS | @ 04:00:00

## 2024-04-10 MED ADMIN — cyanocobalamin (vitamin B-12) tablet 1,000 mcg: 1000 ug | GASTROENTERAL | @ 13:00:00

## 2024-04-10 MED ADMIN — lactulose oral solution: 20 g | GASTROENTERAL | @ 22:00:00

## 2024-04-10 MED ADMIN — lactulose oral solution: 20 g | GASTROENTERAL | @ 13:00:00

## 2024-04-10 MED ADMIN — lactulose oral solution: 20 g | GASTROENTERAL | @ 02:00:00

## 2024-04-10 MED ADMIN — enoxaparin (LOVENOX) syringe 40 mg: 40 mg | SUBCUTANEOUS | @ 13:00:00

## 2024-04-10 MED ADMIN — esomeprazole (NEXIUM) granules 40 mg: 40 mg | GASTROENTERAL | @ 13:00:00

## 2024-04-10 MED ADMIN — multivitamins, therapeutic with minerals tablet 1 tablet: 1 | GASTROENTERAL | @ 13:00:00

## 2024-04-10 MED ADMIN — fentaNYL (PF) (SUBLIMAZE) injection 50 mcg: 50 ug | INTRAVENOUS | @ 03:00:00 | Stop: 2024-04-12

## 2024-04-10 MED ADMIN — fentaNYL (PF) (SUBLIMAZE) injection 50 mcg: 50 ug | INTRAVENOUS | @ 10:00:00 | Stop: 2024-04-12

## 2024-04-10 MED ADMIN — fentaNYL (PF) (SUBLIMAZE) injection 50 mcg: 50 ug | INTRAVENOUS | @ 13:00:00 | Stop: 2024-04-12

## 2024-04-10 MED ADMIN — fentaNYL (PF) (SUBLIMAZE) injection 50 mcg: 50 ug | INTRAVENOUS | Stop: 2024-04-12

## 2024-04-10 MED ADMIN — fentaNYL (PF) (SUBLIMAZE) injection 50 mcg: 50 ug | INTRAVENOUS | @ 09:00:00 | Stop: 2024-04-12

## 2024-04-10 MED ADMIN — acetaminophen (TYLENOL) tablet 650 mg: 650 mg | ORAL | @ 16:00:00

## 2024-04-10 MED ADMIN — cefTRIAXone (ROCEPHIN) 2 g in sodium chloride 0.9 % (NS) 100 mL IVPB-MBP: 2 g | INTRAVENOUS | @ 16:00:00 | Stop: 2024-04-15

## 2024-04-10 MED ADMIN — NORepinephrine 8 mg in dextrose 5 % 250 mL (32 mcg/mL) infusion PMB: 0-30 ug/min | INTRAVENOUS | @ 11:00:00

## 2024-04-10 MED ADMIN — NORepinephrine 8 mg in dextrose 5 % 250 mL (32 mcg/mL) infusion PMB: 0-30 ug/min | INTRAVENOUS | @ 13:00:00

## 2024-04-10 MED ADMIN — lactated ringers bolus 1,000 mL: 1000 mL | INTRAVENOUS | @ 22:00:00 | Stop: 2024-04-10

## 2024-04-10 MED ADMIN — furosemide (LASIX) injection 40 mg: 40 mg | INTRAVENOUS | @ 16:00:00 | Stop: 2024-04-10

## 2024-04-10 NOTE — Plan of Care (Signed)
 Problem: Mechanical Ventilation Invasive  Goal: Effective Communication  Outcome: Ongoing - Unchanged  Goal: Optimal Device Function  Outcome: Ongoing - Unchanged  Intervention: Optimize Device Care and Function  Recent Flowsheet Documentation  Taken 04/09/2024 2013 by Jolaine Rosaline CROME, RRT  Airway/Ventilation Management: humidification applied  Oral Care: mouth swabbed  Goal: Mechanical Ventilation Liberation  Outcome: Ongoing - Unchanged  Goal: Absence of Device-Related Skin and Tissue Injury  Outcome: Ongoing - Unchanged  Goal: Absence of Ventilator-Induced Lung Injury  Outcome: Ongoing - Unchanged  Intervention: Prevent Ventilator-Associated Pneumonia  Recent Flowsheet Documentation  Taken 04/10/2024 0330 by Jolaine Rosaline CROME, RRT  Head of Bed [facility]) Positioning: HOB at 30-45 degrees  Taken 04/09/2024 2013 by Jolaine Rosaline CROME, RRT  Head of Bed [facility]) Positioning: HOB at 30-45 degrees  Oral Care: mouth swabbed

## 2024-04-10 NOTE — Plan of Care (Signed)
 Shift Summary  Ventilator mode was changed from [facility] to PSV-CPAP, with pressure support maintained and no extubation performed.   Endotracheal tube was repositioned and resecured twice, with no adverse reactions and stable cuff pressure documented.   No signs of device-related skin or tissue injury were noted.  Patient remained on ventilator support and tolerated interventions throughout the shift.    Mechanical Ventilation Liberation: Ventilator mode was changed from [facility] to PSV-CPAP during the shift, and pressure support remained stable at 12 cm H2O; patient was not extubated and continued to tolerate ventilator support.    Optimal Device Function: Endotracheal tube was repositioned and resecured twice, with no adverse reactions and consistent cuff pressure; breath sounds remained equal and site condition was dry throughout the shift.

## 2024-04-10 NOTE — Progress Notes (Signed)
 MICU Daily Progress Note     Date of Service: 04/10/2024    Problem List:   Principal Problem:    Bacteremia, escherichia coli  Active Problems:    Alcoholic liver disease (HHS-HCC)    HTN (hypertension)    Depression with anxiety    Class 2 severe obesity with serious comorbidity in adult    Atrial fibrillation    (CMS-HCC)    Pleural effusion    Hypernatremia    Thrombocytopenia    Cirrhosis    (CMS-HCC)    A-fib (CMS-HCC)    Hepatic encephalopathy    (CMS-HCC)      Interval history: [name] is a 78 y.o. female with HFpEF, HTN, Afib on AC, MASLD cirrhosis, originally hospitalized for scheduled TEE/DCCV (aborted d/t LAA clot) c/b AHHRF, encephalopathy and septic shock requiring MICU care, was s/p extubation transferred to [facility] for further management of anticoagulation and hypernatremia. Transferred back to [facility] MICU for closer monitoring for respiratory status, pressor requirement, GI bleed, and management of carotid artery injury.     On 10/24, patient had acute worsening of respiratory status leading requiring intubation. When patient was given sedation for intubation, she coded and promptly had ROSC after a few minutes of CPR. Subsequently, CVC placed in neck, used to draw blood, unclear if given pressors through it, then found  to be in arterial system based on blood gas and imaging, likely right carotid. Line was subsequently removed, and pressure was held for 15 minutes, hemostasis with no hematoma. She was transferred to [facility] MICU for management of this carotid injury, to be evaluated by vascular surgery while also managing her increased vasopressor requirement and respiratory distress.  Has been weaned off pressors since 10/27 at 1 PM. VBG's indicating that she is ready for extubation, however her mentation suggests that she is not able to protect her airway.  She was extubated on 10/31, and mentation slowly improved.  Unfortunately on the morning of 11/2 she had increased lethargy, increased secretions, and she was not protecting her airway.  She was reintubated on 11/2, and required pressor support with norepinephrine and vasopressin after the induction agents.  Self extubated on 11/5. Re intubated on 11/6 due to hypercarbia and increased work of breathing.  We are left with the challenging options regarding the care of [name].    24 hour events:  - Wean Precedex in lieu of Haldol, fentanyl  pushes.  - Ongoing fever, infectious workup initiated, c/f SBP vs HAP   - Ordered LRCx   - POCUS to look for ascitic pocket    - Start Ceftriaxone   - Ordered MRSA Screen    - Diurese iso pulmonary edema    - Palliative care consult, plans to schedule family meeting on 11/10, timing TBD    Neurological   Hepatic encephalopathy  Per chart review at [facility], rapid response called on 10/24 for increased somnolence. At the time, differential included hepatic encephalopathy, toxic metabolic encephalopathy in the setting of shock (possibly sepsis), versus intracranial pathology. She was subsequently intubated. CT head on 10/24 showed no acute intracranial abnormality. Weaned off sedation with slow improvements in neurological status.  While we have continued lactulose  therapy and seeing good response with several bowel movements, her mentation remains poor.  This is concerning for severe ICU delirium.  Meeting was had with daughter, [name], on 11/5.  Described that should intubation be necessary we would likely be moving towards a tracheostomy as [name] has continuously not been protecting  her airway.  As of now [name] wanted to pursue aggressive measures including repeat intubation and potential tracheostomy.  However she stated that she will talk it over with her sister and the rest of her family.  - Continue lactulose , target 3-5 BMs daily.  MRI brain was impeded due to movement, however with we saw was unremarkable.  - Continue lactulose  and rifaximin      Analgesia: No pain issues  RASS at goal? N/A, not on sedation  [place] Agitation Assessment Scale (RASS) : +1 (04/10/2024  8:12 AM)    Pulmonary   Acute Hypoxic Hypercarbic Respiratory Failure   Rapid response called on 10/24 for increased somnolence, found to have acute hypercarbic respiratory failure and subsequently intubated. Suspect hypercarbic respiratory failure due to encephalopathy as above. CXR on 10/24 showed worsening pleural effusions. Not currently getting diuresed. Given patient's hypoxia, and AMS, there was concern for infectious etiology. MRSA nares negative. Infectious workup thus far is unremarkable. SBTs show readiness extubation based on VBGs, however there is concern that she continues to be unable to protect her airway due to her current mentation.  Reintubated on 11/2 followed by therapeutic bronchoscopy.  Self extubated on 11/5.  Reintubate noted on 11/6 due to hypercarbia and not protecting her airway.   - Pall care c/s for weigh in on discussion re: trach 11/10      Cardiovascular   Carotid Artery Injury (resolved)  At [facility], CVC placed intended for RIJ on 10/24 following intubation, PEA, ROSC. Was used with known blood return. Unclear if it was used for pressor support, but highly likely that it was. CXR for placement check showed concern for intra aterial location, and blood gas confirmed it was arterial. Thought to be in carotid artery. CVC removed with pressure held for 15 minutes, with no hematoma formation. On arrival, no physical exam and bedside ultrasound showed no large hematoma. No active bleeding.   - Vascular Surgery consulted, stated fistula has healed based on Ultrasound findings    PEA arrest s/p ROSC  Hypovolemic/Hemorrhagic shock (resolved)  Atrial fibrillation  known left atrial appendage clot  HFpEF  Patient with PEA arrest peri-intubation with ROSC. Shock thought to be hemorrhagic in the setting of GI bleed History of atrial fibrillation with known LAA clot and HFpEF. Vasopressors were weaned off 10/27. APTT was elevated the morning of 10/28 necessitating changing anticoagulation from therapeutic heparin to therapeutic Lovenox .   - Holding Coreg   - Continue home Eliquis for anticoagulation for LAA clot    Renal   Hypernatremia   At [facility], had persistently hypernatremia near 153. Had been treated with D5 gtt. Sodium throughout admission has largely been 146-148. Sodium was 151 morning of 10/28, presume this will decrease once we start tube feeds.  Sodium has overall normalized and continues to be within normal limits.  - Strict I/O's  - Sodium checks  - FWF with TF, adjusting as needed    Infectious Disease/Autoimmune   E. Coli Bacteremia (resolved),   Initially admitted for afib, found to have E. coli bacteremia on October 10. This was treated with cefazolin . Rapid response called 10/24, patient noted to be hypothermic. WBCs jumped from 3.0 to 9.7 on 10/24. Although the most recent lab was following intubation and CPR. UA with pyuria, rare bacteria, hematuria. Peri-intubation, patient developed significant hypotension requiring initiation of NE and vasopressin. Suspect possible septic shock with unknown infectious source. Patient started on vancomycin and cefepime 10/24. CT Abdomen pelvis concerning for aspiration pneumonia. Following infectious workup  was unremarkable. Antibiotics were stopped on 10/27.  Infectious workup was repeated on 11/3 due to reintubation and status post bronchoscopy.  - follow blood, urine cultures to final, so far no growth to date   - Repeat infectious w/u 11/8    Cultures:  Blood Culture, Routine (no units)   Date Value   04/04/2024 No Growth at 5 days   04/04/2024 No Growth at 5 days     Urine Culture, Comprehensive (no units)   Date Value   04/04/2024 NO GROWTH   03/25/2024 <10,000 CFU/mL     Lower Respiratory Culture (no units)   Date Value   04/04/2024 OROPHARYNGEAL FLORA ISOLATED   03/29/2024 Specimen Not Processed     WBC (10*9/L)   Date Value   04/10/2024 5.4     WBC, UA (/HPF)   Date Value 04/09/2024 61 (H)       FEN/GI   Sigmoid Mass c/f Malignancy and Hematochezia (resolved)  Hemorrhagic Shock (resolved)  CT abdomen pelvis done 10/24 with evidence of cirrhosis and masslike thickening of the lower sigmoid colon concerning for malignancy.  CTA abdomen pelvis performed 1024 with active extravasation into the gastric fundus possibly secondary to traumatic enteric tube placement.  GI scoped the patient on 10/25 and clipped an area of active bleeding.  They removed a total of 1.5 L of blood.  Hemoglobin 10/20 6 AM downtrended to 6.0, so 2 units of blood transfused.  GI stated this was consistent with 10/25 scope and likely did not represent increased bleeding. Hb steadily improving with Hb of 9.2 on 10/28.   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 Given shock of unclear etiology. CTAP demonstrating cirrhosis. Likely that cirrhosis is contributing to hypotension.   - Continue lactulose ; target 3-5 BMs daily  - NGT in place, on TF and FWF  - Daily MELD labs and CMP     MELD 3.0: 15 at 04/05/2024  4:31 AM  MELD-Na: 13 at 04/05/2024  4:31 AM  Calculated from:  Serum Creatinine: 0.43 mg/dL (Using min of 1 mg/dL) at 88/09/7972  5:68 AM  Serum Sodium: 143 mmol/L (Using max of 137 mmol/L) at 04/05/2024  4:31 AM  Total Bilirubin: 1.3 mg/dL at 88/09/7972  5:68 AM  Serum Albumin: 2.4 g/dL at 88/09/7972  5:68 AM  INR(ratio): 1.61 at 04/03/2024 10:26 AM  Age at listing (hypothetical): 78 years  Sex: Female at 04/05/2024  4:31 AM    Provider Malnutrition Assessment:  Body mass index is 39.22 kg/m??. BMI Interpretation: >/= 30 and < 40, consistent with obesity, clinically significant requiring additional resources and complicating multiple aspects of patient care.  GLIM criteria:   Pt does not meet criteria  -I have screened this patient for malnutrition and they did NOT meet criteria for malnutrition based on GLIM criteria.  -TF and FWF; Dietician consulted, appreciate assistance    Heme/Coag   Acute blood loss anemia  Hemoglobin down trended to 6.0 on the morning of 10/26 thought to be secondary to acute GI bleed that was scoped and clipped 10/25. Patient received 2 units PRBC 10/26 and an additional unit 10/27. Hb steadily improving with Hb of 9.2 on 10/28.   - Continue to follow hemoglobin    Endocrine   History of R HR+/HER2 low (2+) IDC Breast Cancer s/p Partial  Mastectomy and Radiation (2022)   - Continue home anastrozole     Integumentary   NAI   #  - WOCN consulted for high risk skin assessment No. Reason: Not indicated.    Prophylaxis/LDA/Restraints/Consults   ICU Checklist completed: yes (see ICU rounding navigator in Epic)    Patient Lines/Drains/Airways Status       Active Active Lines, Drains, & Airways       Name Placement date Placement time Site Days    ETT  7.5 04/07/24  1259  -- 2    NG/OG Tube Feedings 10 Fr. Right nostril 04/01/24  1138  Right nostril  8    Urethral Catheter Temperature probe 16 Fr. 04/09/24  1030  Temperature probe  less than 1    PICC Double Lumen 04/06/24 Left Basilic 04/06/24  1558  Basilic  3    Arterial Line 04/03/24 Right Radial 04/03/24  0830  Radial  7                  Patient Lines/Drains/Airways Status       Active Wounds       Name Placement date Placement time Site Days    Wound 03/01/24 Other (Comment) Toe (Comment which one) Left;Other (Comment) Blister present on arrival, tx already in outpatient setting, in process of healing, surrounding erythema 03/01/24  0129  Toe (Comment which one)  40    Wound 03/13/24 Irritant Contact Dermatitis Incontinence Sacrum Mid gluteal cleft MASD? 03/13/24  --  Sacrum  28                  Goals of Care     Code Status:   Orders Placed This Encounter   Procedures    Full Code     Standing Status:   Standing     Number of Occurrences:   1        Designated Healthcare Decision Maker:  Ms. Nyland designated healthcare decision maker(s) is/are   HCDM (patient stated preference): Deitrick,[name] - Spouse - [phone]    HCDM, First AlternateTasneem, Cormier - Daughter - [phone]. See HCDM section of Epic sidebar/storyboard or ACP tab in patient chart for details regarding active HCDMs and patient capacity for decision-making.      Subjective     Patient intubated this morning on my exam. Follows commands occasionally and waves hello     Objective     Vitals - past 24 hours  Pulse:  [72-137] 137  SpO2 Pulse:  [67-137] 135  Resp:  [13-34] 21  FiO2 (%):  [30 %] 30 %  SpO2:  [98 %-100 %] 98 % Intake/Output  I/O last 3 completed shifts:  In: 3688.4 [P.O.:120; I.V.:801.7; NG/GT:2470; IV Piggyback:296.7]  Out: 2340 [Urine:1140; Stool:1200]     Physical Exam:    General: intubated on my examination. Arouses to voice  HEENT: normocephalic, atraumatic   CV: pulses palpable, regular borderline tachycardia   Pulm: Rhonchi w transmitted upper airway sounds throughout  GI: soft, NTND, + BS  MSK: edema noted to her knees bilaterally, no clubbing or cyanosis  Skin: numerous large flat violaceous ecchymosis on left arm, chest, neck bilaterally.   Neuro: Withdrawals to painful stimuli. Responding to name and reliably following commands.     Continuous Infusions:   Infusions Meds[1]    Scheduled Medications:   Scheduled Medications[2]    PRN medications:  PRN Medications[3]    Data/Imaging Review: Reviewed in Epic and personally interpreted on 04/10/2024. See EMR for detailed results.  Ilan Kahrs S Kenneisha Cochrane, MD  PGY1 Internal Medicine, [facility]           [1]    NORepinephrine bitartrate-NS 2 mcg/min (04/10/24 0804)   [2]    [Provider Hold] apixaban  5 mg Enteral tube: gastric BID    cyanocobalamin (vitamin B-12)  1,000 mcg Enteral tube: gastric Daily    enoxaparin (LOVENOX) injection  40 mg Subcutaneous Q24H SCH    esomeprazole  40 mg Enteral tube: gastric Daily    flu vac 2025 [id number](PF)  0.5 mL Intramuscular During hospitalization    folic acid   1 mg Enteral tube: gastric Daily    lactulose   20 g Enteral tube: gastric TID    multivitamins (ADULT)  1 tablet Enteral tube: gastric Daily    rifAXIMin   550 mg Enteral tube: gastric BID    sodium chloride   10 mL Intravenous Q8H    sodium chloride   10 mL Intravenous Q8H    thiamine  mononitrate (vit B1)  100 mg Enteral tube: gastric Daily   [3] acetaminophen , docusate sodium, fentaNYL  (PF) **OR** fentaNYL  (PF), haloperidol LACTATE **OR** [DISCONTINUED] haloperidol LACTATE

## 2024-04-11 LAB — BLOOD GAS CRITICAL CARE PANEL, ARTERIAL
BASE EXCESS ARTERIAL: 5.3 — ABNORMAL HIGH (ref -2.0–2.0)
BASE EXCESS ARTERIAL: 5.6 — ABNORMAL HIGH (ref -2.0–2.0)
BASE EXCESS ARTERIAL: 2.9 — ABNORMAL HIGH (ref -2.0–2.0)
BASE EXCESS ARTERIAL: 3.6 — ABNORMAL HIGH (ref -2.0–2.0)
CALCIUM IONIZED ARTERIAL (MG/DL): 4.76 mg/dL (ref 4.40–5.40)
CALCIUM IONIZED ARTERIAL (MG/DL): 4.58 mg/dL (ref 4.40–5.40)
CALCIUM IONIZED ARTERIAL (MG/DL): 4.61 mg/dL (ref 4.40–5.40)
CALCIUM IONIZED ARTERIAL (MG/DL): 4.63 mg/dL (ref 4.40–5.40)
CARBOXYHEMOGLOBIN: 2.4 % — ABNORMAL HIGH (ref <1.2)
CARBOXYHEMOGLOBIN: 2.3 % — ABNORMAL HIGH (ref <1.2)
CARBOXYHEMOGLOBIN: 2.3 % — ABNORMAL HIGH (ref <1.2)
CARBOXYHEMOGLOBIN: 2.5 % — ABNORMAL HIGH (ref <1.2)
CHLORIDE, WHOLE BLOOD: 111 mmol/L — ABNORMAL HIGH (ref 98–107)
CHLORIDE, WHOLE BLOOD: 114 mmol/L — ABNORMAL HIGH (ref 98–107)
CHLORIDE, WHOLE BLOOD: 110 mmol/L — ABNORMAL HIGH (ref 98–107)
CHLORIDE, WHOLE BLOOD: 110 mmol/L — ABNORMAL HIGH (ref 98–107)
GLUCOSE WHOLE BLOOD: 169 mg/dL (ref 70–179)
GLUCOSE WHOLE BLOOD: 167 mg/dL (ref 70–179)
GLUCOSE WHOLE BLOOD: 132 mg/dL (ref 70–179)
GLUCOSE WHOLE BLOOD: 134 mg/dL (ref 70–179)
HCO3 ARTERIAL: 29 mmol/L — ABNORMAL HIGH (ref 22–27)
HCO3 ARTERIAL: 29 mmol/L — ABNORMAL HIGH (ref 22–27)
HCO3 ARTERIAL: 27 mmol/L (ref 22–27)
HCO3 ARTERIAL: 27 mmol/L (ref 22–27)
HEMOGLOBIN BLOOD GAS: 6.8 g/dL — ABNORMAL LOW (ref 12.00–16.00)
HEMOGLOBIN BLOOD GAS: 7.3 g/dL — ABNORMAL LOW (ref 12.00–16.00)
HEMOGLOBIN BLOOD GAS: 5.6 g/dL — ABNORMAL LOW (ref 12.00–16.00)
HEMOGLOBIN BLOOD GAS: 6.2 g/dL — ABNORMAL LOW (ref 12.00–16.00)
LACTATE BLOOD ARTERIAL: 1.4 mmol/L — ABNORMAL HIGH (ref <1.3)
LACTATE BLOOD ARTERIAL: 1.8 mmol/L — ABNORMAL HIGH (ref <1.3)
LACTATE BLOOD ARTERIAL: 0.9 mmol/L (ref <1.3)
LACTATE BLOOD ARTERIAL: 0.7 mmol/L (ref <1.3)
METHEMOGLOBIN: <1 % (ref <1.5)
METHEMOGLOBIN: <1 % (ref <1.5)
METHEMOGLOBIN: <1 % (ref <1.5)
METHEMOGLOBIN: <1 % (ref <1.5)
O2 SATURATION ARTERIAL: 99.9 % (ref 94.0–100.0)
O2 SATURATION ARTERIAL: 99.1 % (ref 94.0–100.0)
O2 SATURATION ARTERIAL: 99.7 % (ref 94.0–100.0)
O2 SATURATION ARTERIAL: 98.9 % (ref 94.0–100.0)
OXYHEMOGLOBIN: 97 % (ref 94.0–100.0)
OXYHEMOGLOBIN: 96.7 % (ref 94.0–100.0)
OXYHEMOGLOBIN: 97.2 % (ref 94.0–100.0)
OXYHEMOGLOBIN: 96.2 % (ref 94.0–100.0)
PCO2 ARTERIAL: 39.8 mmHg (ref 35.0–45.0)
PCO2 ARTERIAL: 39.5 mmHg (ref 35.0–45.0)
PCO2 ARTERIAL: 39.6 mmHg (ref 35.0–45.0)
PCO2 ARTERIAL: 36.7 mmHg (ref 35.0–45.0)
PH ARTERIAL: 7.48 — ABNORMAL HIGH (ref 7.35–7.45)
PH ARTERIAL: 7.48 — ABNORMAL HIGH (ref 7.35–7.45)
PH ARTERIAL: 7.45 (ref 7.35–7.45)
PH ARTERIAL: 7.48 — ABNORMAL HIGH (ref 7.35–7.45)
PO2 ARTERIAL: 128 mmHg — ABNORMAL HIGH (ref 80.0–110.0)
PO2 ARTERIAL: 104 mmHg (ref 80.0–110.0)
PO2 ARTERIAL: 106 mmHg (ref 80.0–110.0)
PO2 ARTERIAL: 94.7 mmHg (ref 80.0–110.0)
POTASSIUM WHOLE BLOOD: 3.5 mmol/L (ref 3.4–4.6)
POTASSIUM WHOLE BLOOD: 3.6 mmol/L (ref 3.4–4.6)
POTASSIUM WHOLE BLOOD: 3.6 mmol/L (ref 3.4–4.6)
POTASSIUM WHOLE BLOOD: 3.5 mmol/L (ref 3.4–4.6)
SODIUM WHOLE BLOOD: 146 mmol/L — ABNORMAL HIGH (ref 135–145)
SODIUM WHOLE BLOOD: 141 mmol/L (ref 135–145)
SODIUM WHOLE BLOOD: 147 mmol/L — ABNORMAL HIGH (ref 135–145)
SODIUM WHOLE BLOOD: 146 mmol/L — ABNORMAL HIGH (ref 135–145)

## 2024-04-11 LAB — CBC W/ AUTO DIFF
BASOPHILS ABSOLUTE COUNT: 0 10*9/L (ref 0.0–0.1)
BASOPHILS RELATIVE PERCENT: 0.8 %
EOSINOPHILS ABSOLUTE COUNT: 0.1 10*9/L (ref 0.0–0.5)
EOSINOPHILS RELATIVE PERCENT: 2.3 %
HEMATOCRIT: 23.6 % — ABNORMAL LOW (ref 34.0–44.0)
HEMOGLOBIN: 7.8 g/dL — ABNORMAL LOW (ref 11.3–14.9)
LYMPHOCYTES ABSOLUTE COUNT: 0.6 10*9/L — ABNORMAL LOW (ref 1.1–3.6)
LYMPHOCYTES RELATIVE PERCENT: 15.3 %
MEAN CORPUSCULAR HEMOGLOBIN CONC: 32.9 g/dL (ref 32.0–36.0)
MEAN CORPUSCULAR HEMOGLOBIN: 32.5 pg — ABNORMAL HIGH (ref 25.9–32.4)
MEAN CORPUSCULAR VOLUME: 98.8 fL — ABNORMAL HIGH (ref 77.6–95.7)
MEAN PLATELET VOLUME: 8.7 fL (ref 6.8–10.7)
MONOCYTES ABSOLUTE COUNT: 0.5 10*9/L (ref 0.3–0.8)
MONOCYTES RELATIVE PERCENT: 12.3 %
NEUTROPHILS ABSOLUTE COUNT: 2.8 10*9/L (ref 1.8–7.8)
NEUTROPHILS RELATIVE PERCENT: 69.3 %
PLATELET COUNT: 127 10*9/L — ABNORMAL LOW (ref 150–450)
RED BLOOD CELL COUNT: 2.39 10*12/L — ABNORMAL LOW (ref 3.95–5.13)
RED CELL DISTRIBUTION WIDTH: 22.6 % — ABNORMAL HIGH (ref 12.2–15.2)
WBC ADJUSTED: 4 10*9/L (ref 3.6–11.2)

## 2024-04-11 LAB — HEPATIC FUNCTION PANEL
ALBUMIN: 2.4 g/dL — ABNORMAL LOW (ref 3.4–5.0)
ALKALINE PHOSPHATASE: 170 U/L — ABNORMAL HIGH (ref 46–116)
ALT (SGPT): 18 U/L (ref 10–49)
AST (SGOT): 27 U/L (ref <=34)
BILIRUBIN DIRECT: 0.7 mg/dL — ABNORMAL HIGH (ref 0.00–0.30)
BILIRUBIN TOTAL: 1.2 mg/dL (ref 0.3–1.2)
PROTEIN TOTAL: 5.4 g/dL — ABNORMAL LOW (ref 5.7–8.2)

## 2024-04-11 LAB — CYSTATIN C
CYSTATIN C: 1.53 mg/L — ABNORMAL HIGH (ref 0.64–1.23)
EGFR CKD-EPI (2012) CYSTATIN C FEMALE: 38 mL/min/1.73m2 — ABNORMAL LOW (ref >=60)

## 2024-04-11 LAB — BASIC METABOLIC PANEL
ANION GAP: 9 mmol/L (ref 5–14)
BLOOD UREA NITROGEN: 17 mg/dL (ref 9–23)
BUN / CREAT RATIO: 34
CALCIUM: 8.2 mg/dL — ABNORMAL LOW (ref 8.7–10.4)
CHLORIDE: 108 mmol/L — ABNORMAL HIGH (ref 98–107)
CO2: 30 mmol/L (ref 20.0–31.0)
CREATININE: 0.5 mg/dL — ABNORMAL LOW (ref 0.55–1.02)
EGFR CKD-EPI (2021) FEMALE: >90 mL/min/1.73m2 (ref >=60)
GLUCOSE RANDOM: 162 mg/dL (ref 70–179)
POTASSIUM: 3.7 mmol/L (ref 3.4–4.8)
SODIUM: 147 mmol/L — ABNORMAL HIGH (ref 135–145)

## 2024-04-11 LAB — APTT
APTT: 30.4 s (ref 24.8–38.4)
APTT: 97.7 s — ABNORMAL HIGH (ref 24.8–38.4)
HEPARIN CORRELATION: 0.2
HEPARIN CORRELATION: 0.6

## 2024-04-11 LAB — MAGNESIUM: MAGNESIUM: 1.8 mg/dL (ref 1.6–2.6)

## 2024-04-11 LAB — PHOSPHORUS: PHOSPHORUS: 2.2 mg/dL — ABNORMAL LOW (ref 2.4–5.1)

## 2024-04-11 MED ADMIN — sodium chloride (NS) 0.9 % flush 10 mL: 10 mL | INTRAVENOUS | @ 08:00:00

## 2024-04-11 MED ADMIN — sodium chloride (NS) 0.9 % flush 10 mL: 10 mL | INTRAVENOUS | @ 23:00:00

## 2024-04-11 MED ADMIN — sodium chloride (NS) 0.9 % flush 10 mL: 10 mL | INTRAVENOUS | @ 17:00:00

## 2024-04-11 MED ADMIN — sodium chloride (NS) 0.9 % flush 10 mL: 10 mL | INTRAVENOUS | @ 01:00:00

## 2024-04-11 MED ADMIN — rifAXIMin (XIFAXAN) oral suspension: 550 mg | GASTROENTERAL | @ 13:00:00 | Stop: 2025-04-11

## 2024-04-11 MED ADMIN — rifAXIMin (XIFAXAN) oral suspension: 550 mg | GASTROENTERAL | @ 01:00:00 | Stop: 2024-04-12

## 2024-04-11 MED ADMIN — sodium chloride (NS) 0.9 % flush 10 mL: 10 mL | INTRAVENOUS | @ 09:00:00

## 2024-04-11 MED ADMIN — folic acid (FOLVITE) tablet 1 mg: 1 mg | GASTROENTERAL | @ 13:00:00

## 2024-04-11 MED ADMIN — thiamine mononitrate (vit B1) tablet 100 mg: 100 mg | GASTROENTERAL | @ 13:00:00

## 2024-04-11 MED ADMIN — haloperidol LACTATE (HALDOL) injection 5 mg: 5 mg | INTRAVENOUS | @ 16:00:00

## 2024-04-11 MED ADMIN — cyanocobalamin (vitamin B-12) tablet 1,000 mcg: 1000 ug | GASTROENTERAL | @ 13:00:00

## 2024-04-11 MED ADMIN — lactulose oral solution: 20 g | GASTROENTERAL | @ 19:00:00

## 2024-04-11 MED ADMIN — lactulose oral solution: 20 g | GASTROENTERAL | @ 13:00:00

## 2024-04-11 MED ADMIN — lactulose oral solution: 20 g | GASTROENTERAL | @ 01:00:00

## 2024-04-11 MED ADMIN — enoxaparin (LOVENOX) syringe 40 mg: 40 mg | SUBCUTANEOUS | @ 13:00:00 | Stop: 2024-04-11

## 2024-04-11 MED ADMIN — esomeprazole (NEXIUM) granules 40 mg: 40 mg | GASTROENTERAL | @ 13:00:00

## 2024-04-11 MED ADMIN — multivitamins, therapeutic with minerals tablet 1 tablet: 1 | GASTROENTERAL | @ 13:00:00

## 2024-04-11 MED ADMIN — albumin human 25 % 150 g: 1.5 g/kg | INTRAVENOUS | @ 16:00:00 | Stop: 2024-04-11

## 2024-04-11 MED ADMIN — fentaNYL (PF) (SUBLIMAZE) injection 50 mcg: 50 ug | INTRAVENOUS | @ 10:00:00 | Stop: 2024-04-12

## 2024-04-11 MED ADMIN — fentaNYL (PF) (SUBLIMAZE) injection 50 mcg: 50 ug | INTRAVENOUS | @ 02:00:00 | Stop: 2024-04-12

## 2024-04-11 MED ADMIN — acetaminophen (TYLENOL) tablet 650 mg: 650 mg | ORAL | @ 13:00:00

## 2024-04-11 MED ADMIN — metoPROLOL (Lopressor) injection 2.5 mg: 2.5 mg | INTRAVENOUS | @ 04:00:00 | Stop: 2024-04-10

## 2024-04-11 MED ADMIN — cefTRIAXone (ROCEPHIN) 2 g in sodium chloride 0.9 % (NS) 100 mL IVPB-MBP: 2 g | INTRAVENOUS | @ 16:00:00 | Stop: 2024-04-15

## 2024-04-11 MED ADMIN — heparin 25,000 Units/250 mL (100 units/mL) in 0.45% saline infusion (premade): 0-24 [IU]/kg/h | INTRAVENOUS | @ 17:00:00

## 2024-04-11 MED ADMIN — potassium chloride (KLOR-CON) packet 40 mEq: 40 meq | GASTROENTERAL | @ 01:00:00 | Stop: 2024-04-10

## 2024-04-11 MED ADMIN — fentaNYL (PF) (SUBLIMAZE) injection 25 mcg: 25 ug | INTRAVENOUS | @ 04:00:00 | Stop: 2024-04-12

## 2024-04-11 MED ADMIN — fentaNYL (PF) (SUBLIMAZE) injection 25 mcg: 25 ug | INTRAVENOUS | @ 15:00:00 | Stop: 2024-04-12

## 2024-04-11 MED ADMIN — lactated ringers bolus 500 mL: 500 mL | INTRAVENOUS | @ 07:00:00 | Stop: 2024-04-11

## 2024-04-11 MED ADMIN — potassium phosphate 30 mmol in sodium chloride (NS) 0.9 % 500 mL infusion: 30 mmol | INTRAVENOUS | @ 14:00:00 | Stop: 2024-04-11

## 2024-04-11 NOTE — Plan of Care (Signed)
 Shift Summary      Ventilator settings and modes were adjusted, and airway suctioning was performed as needed, with stable ETT placement.     Patient is currently on PRVC 340 mL - 16 RR - 5+ - 30% because she got tachypenic during shift on PSV 12/5 30%. Patient airway is patent and secured.

## 2024-04-11 NOTE — Progress Notes (Signed)
 Adult Nutrition Progress Note    Visit Type: Follow-Up  Reason for Visit: Enteral Nutrition    NUTRITION INTERVENTIONS and RECOMMENDATION     Tube feeds:   Recommend Nutren 2.0 at goal rate 35 mL/hr. This provides 1470 kcals, 62 g protein, 159 g carbohydrate, 68 g fat, 0 g fiber, 508 mL free water , and meets 98% USRDI.  Prosource 2x BID --> 240 kcal, 60 g protein   Total nutrition: 1710 kcal, 122 g protein   FWF: 200 mL Q2h for hypernatremia - continuing this given receiving significant IV albumin today    Carries severe malnutrition dx - needing ongoing K/phos repletion   Weekly weights  Continue multivitamin, thiamine  100 mg     NUTRITION ASSESSMENT     Patient appropriate for enteral nutrition support given mechanical ventilation.   Lactulose  being titrated for goal of 3-5 BMs per day - FMS in place  Decreasing FWF given hypernatremia resolving     NUTRITIONALLY RELEVANT DATA     HPI & PMH:   [name] is a 78 y.o. female who is presenting to [facility] with Bacteremia, escherichia coli, in the setting of the following pertinent/contributing co-morbidities:  HFpEF, HTN, Cirrhosis, Breast Cancer s/p Partial Mastectomy/Radiation.     Nutrition Progress:   Continues to tolerate tube feeds at goal. Switched feeds to fluid restricted formula last week to to volume overload - ongoing difficulties with fluid status in the setting of cirrhosis & intravascular volume depletion.   FMS remains in place (750, 1850, 850, 1350 mL) outputs past 4 days   30 mmol k phos this morning     Medications:  Nutritionally pertinent medications reviewed and evaluated for potential food and/or medication interactions.   B12, rifaximin , nexium, folvite , lasix, LR boluses, multivitamin, 30 mmol kphos, thiamine  100 mg  OFF pressors     Labs:   Nutritionally pertinent labs reviewed.   Na (151 --> 147), phos (2.2), glucose values acceptable       Nutritional Needs:   Daily Estimated Nutrient Needs:  Energy: [phone] kcals 11-14 kcal/kg (per ASPEN/SCCM guidelines for the critically ill obese, BMI 30-50) using admission body weight, 87 kg (03/29/24 1356)]  Protein: > 109 gm [> 2 gm/kg (per ASPEN/SCCM guidelines for the critically ill obese, BMI 30-39.9) using ideal body weight, 55 kg (03/29/24 1356)]  Carbohydrate:   [45-60% of kcal]  Fluid:   mL [per MD team]    Compared to Adjusted PSU Equation for Ventillated Patients (BMI >/=30, Age >/= 60)  Tmax: 38, Ve: 7.24  Energy: 1682 kcals/day    Anthropometric Data:  Height: 162.6 cm (5' 4.02)   Admission weight: 87 kg (191 lb 12.8 oz)  Last recorded weight: (!) 103.7 kg (228 lb 9.9 oz) (04/02/24)  IBW: 54.53 kg  BMI: Body mass index is 39.22 kg/m??.   Usual Body Weight: Unable to obtain at this time   Weight Assessment: per below - trending down pta, though pt with cirrhosis & currently unknown dry wt. Increase over admit likely influenced by fluid - pt noted with 2+ pitting edema bilateral knees and I/Os show pt +11 L over admit.    Wt Readings from Last 10 Encounters:   04/02/24 (!) 103.7 kg (228 lb 9.9 oz)   03/07/24 89.3 kg (196 lb 12.8 oz)   03/03/24 89.1 kg (196 lb 6.4 oz)   04/23/23 96.6 kg (213 lb)   03/05/23 100.2 kg (220 lb 14.4 oz)   11/27/22 (!) 102.5 kg (225  lb 14.4 oz)   10/20/22 (!) 108 kg (238 lb)   07/17/22 (!) 108 kg (238 lb)   06/09/22 (!) 103.2 kg (227 lb 8 oz)   05/08/22 (!) 105.8 kg (233 lb 3.2 oz)     Malnutrition Assessment:  Malnutrition Assessment using AND/ASPEN or GLIM Clinical Characteristics:  Patient does not meet AND/ASPEN criteria for malnutrition at this time (03/16/24 1326)       Nutrition Focused Physical Exam:      Nutrition Evaluation  Overall Impressions: Nutrition-Focused Physical Exam not indicated due to lack of malnutrition risk factors. (03/16/24 1326)     Care plan:  Patient does not meet malnutrition criteria     Current Nutrition:  NPO with OG tube in place   Nutrition Orders            Nutren 2.0 continuous tube feed starting at 11/09 1300 Separate Enteral Additives ProSource NoCarb (Protein Modular/Sugarfree); Quantity: 2 pkts BID starting at 11/04 2000          Nutritionally Pertinent Allergies, Intolerances, Sensitivities, and/or Cultural/Religious Restrictions:  none identified at this time     GOALS and EVALUATION     Patient to meet 80% or greater of nutritional needs via enteral nutrition while remains on tube feeds. - Meeting/Ongoing    Motivation, Barriers, and Compliance:  Evaluation of motivation, barriers, and compliance pending at this time due to clinical status.     Discharge Planning:   Monitor for potential discharge needs with multi-disciplinary team.          Follow-Up Parameters:   1-2 times per week (and more frequent as indicated)    [name] MPH, RD, CNSC, LDN   Pager: [phone]  Phone: [phone]  [name]

## 2024-04-11 NOTE — Consults (Signed)
 Palliative Care Consult Note    Consultation from Requesting Attending Physician:  Morene Lang Early, MD  Service Requesting Consult:  Medical ICU (MDI)  Reason for Consult Request from Attending Physician:  Evaluation of Goals of Care / Decision Making  Primary Care Provider:  Alyse Slater Pao, MD      Assessment/Plan:      SUMMARY:  This 78 y.o. patient is seriously and acutely ill due to repeated failed extubations with prolonged need for ventilation after originally being hospitalized for scheduled TEE/DCCV (aborted d/t LAA clot) c/b AHHRF, encephalopathy and septic shock requiring MICU care, also complicated by co-morbid acute and chronic conditions including carotid injury, HFpEF, HTN, Afib on AC, and MASLD cirrhosis.    Symptom Assessment and Recommendations:      Goals of Care and Decision Making Assessment and Recommendations:     Healthcare Decision Maker if lacks capacity:    HCDM (patient stated preference): Fialkowski,[name] - Spouse - [phone]    HCDM, First AlternateLynnelle, Mesmer - Daughter - [phone]    Advance Directive: no    Code status:   Code Status: Full Code       Goals of care as discussed on 11/10:  - Mikyla's family shares this has been a long and difficult hospitalization, with lots of unexpected decompensations  - They share that before this hospitalization, Leather was doing well at home.  She was independent.  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  This is a MAJOR change for her.  - They would like to talk about the risks, benefits, and alternatives to tracheostomy at a meeting together with the medical team on Wednesday.  [name] is going to call her sister Sari to discuss what time they are able to meet.  - The ICU team shares that in the past, there have been questions from the family about if they would offer extubation and reintubation if indicated for a 5th time (as an alternative to proceeding straight to tracheostomy).  They would likely not offer this due to substantial risks without much benefit.  -> I will call [name] 11/11 to close loop on timing of the meeting on Wednesday 11/12    Practical, Emotional, Spiritual Assessment and Recommendations:  Patient's daughters would like the patient to be included in decision making as much as possible      Recommendations discussed with primary team via Epic chat    Thank you for this consult. Please contact  Darryle Dustman MD or page Palliative Care if there are any questions.  Palliative Care plans to visit the patient again on 11/11.    Subjective:     HPI:  Obtained from family and chart review iso patient's AMS.    Patient intubated in the medical ICU.  She was unable to converse with me but was intermittently able to squeeze my hand in response to yes or no questions.  She endorsed being in some degree of pain/discomfort and indicated that it is from the breathing tube.  She did not indicate any other pains.    Talked with family at bedside which included patient's brother Gearld) and his wife.  They shared that Ocie has a large family.  She is married, although her spouse is in poor health and currently in rehab after a stroke.  She has 2 daughters, Channel and Sari.  Channel (goes by [name]) has a daughter named Maylee.  Sari has 2 adult children.  Wendy's son (pt's grandson) and his  wife, and their daughter (patient's great granddaughter) were living with the patient prior to this hospitalization.  Terrin also has a younger brother.  Most of the family lives in the same area in [place].  They are aware that the patient has been on the ventilator time, and there will be some discussion soon about whether to proceed with tracheostomy or not.    Later in the afternoon, talked with patient's daughter [name] at bedside.  She shared more information about how hard this hospitalization has been for her mom.  There have been a lot of ups and downs.  There have been moments when she was getting better and may be approaching readiness for discharge, and then faced major setback.  She is hoping to have a group conversation with herself, her sister Sari, the patient (is much as she is able to participate) and the medical team on Wednesday.  She is going to call her sister to try to work out the timing.  At that meeting, she is hoping to learn all of the pros, cons, and details about what a tracheostomy would entail, versus reasonable alternatives.    Symptom Severity, Assessment and Current Medication / Treatment:     Pain:  Some 2/2 breathing tube  Shortness of breath:  On ventilator  Nausea:  N/A  Constipation:  Stool output via FMS  Other symptoms:  Delirium ongoing 2/2 HE, ICU stay, critical illness    Living situation:   Was living with her grandson, grand-DIL and grandchild  Support system / caregivers:  Spouse (although he is ill), two brothers, two children, some grandchildren  Coping / spiritual: Not addressed      Objective:       Function:  30% - Ambulation: Totally Bed Bound / Unable to do any work, extensive disease / Self-Care: Total care / Intake: Reduced / Level of Conscious: Full, drowsy, or confusion    Pulse:  [91-137] 107  SpO2 Pulse:  [92-139] 108  Resp:  [15-38] 27  FiO2 (%):  [30 %] 30 %  SpO2:  [94 %-100 %] 100 %    Physical Exam:  General:  Chronically ill appearing woman in NAD  HEENT:  Normocephalic, ET tube in place  Eyes:  EOM intact  Cardiovascular:  Tachycardic rate, regular rhythm, systolic murmur  Pulmonary:  Rhonchorous breath sounds on ventilator  Gastrointestinal:  Soft and non-tender, +BS  Ext:  Pitting bilateral LEE  Skin:  No rashes on clothed exam  Psych:  Able to intermittently follow simple commands, squeeze hand in response to questions      Testing reviewed and interpreted:   Reviewed and interpreted test results for anemia, AKI, hypernatremia affecting assessment of underlying illness severity and prognosis    I personally spent 100 minutes face-to-face and non-face-to-face in the care of this patient, which includes all pre, intra, and post visit time on the date of service.  All documented time was specific to the E/M visit and does not include any procedures that may have been performed.     See ACP Note from today for additional billable service:  No.    Darryle Dustman MD  HPM Fellow

## 2024-04-11 NOTE — Plan of Care (Signed)
 Pt remains on the ventilator, 30%. PS 12/5 majority of the day. Back on rate this evening due to tachypnea. RASS -1 to 0 this shift. Following all commands. Afebrile. Afib 80-110s. MAP>65 of pressors. TF running at goal. FMS with ~82mL output. UOP via foley. Heparin drip started. Bath/CHG complete. See flowsheets/MAR for more info.     Problem: Skin Injury Risk Increased  Goal: Skin Health and Integrity  Outcome: Shift Focus  Intervention: Optimize Skin Protection  Recent Flowsheet Documentation  Taken 04/11/2024 1200 by Debby Jessie BRAVO, RN  Head of Bed [facility]) Positioning: HOB at 30-45 degrees  Taken 04/11/2024 0800 by Debby Jessie BRAVO, RN  Pressure Reduction Techniques:   frequent weight shift encouraged   heels elevated off bed   weight shift assistance provided  Head of Bed (HOB) Positioning: HOB at 30-45 degrees  Pressure Reduction Devices: pressure-redistributing mattress utilized  Skin Protection:   adhesive use limited   cleansing with dimethicone incontinence wipes     Problem: Non-Violent Restraints  Goal: Patient will remain free of restraint events  Outcome: Shift Focus  Goal: Patient will remain free of physical injury  Outcome: Shift Focus     Problem: Comorbidity Management  Goal: Blood Pressure in Desired Range  Outcome: Shift Focus     Problem: Mechanical Ventilation Invasive  Goal: Optimal Device Function  Outcome: Shift Focus  Intervention: Optimize Device Care and Function  Recent Flowsheet Documentation  Taken 04/11/2024 1200 by Semaje Kinker E, RN  Oral Care:   mouth swabbed   suction provided  Taken 04/11/2024 0800 by Isaac Dubie E, RN  Oral Care:   mouth swabbed   suction provided   teeth brushed   tongue brushed     Problem: Adult Inpatient Plan of Care  Goal: Absence of Hospital-Acquired Illness or Injury  Intervention: Identify and Manage Fall Risk  Recent Flowsheet Documentation  Taken 04/11/2024 0800 by Kayleeann Huxford E, RN  Safety Interventions:   aspiration precautions bed alarm   bleeding precautions   enteral feeding safety   environmental modification   fall reduction program maintained   infection management   lighting adjusted for tasks/safety   low bed   no IV/BP/blood draw left arm   room near unit station  Intervention: Prevent Skin Injury  Recent Flowsheet Documentation  Taken 04/11/2024 1200 by Debby Jessie BRAVO, RN  Positioning for Skin: Right  Taken 04/11/2024 1000 by Debby Jessie BRAVO, RN  Positioning for Skin: Left  Taken 04/11/2024 0800 by Debby Jessie BRAVO, RN  Positioning for Skin: Right  Device Skin Pressure Protection:   absorbent pad utilized/changed   adhesive use limited  Skin Protection:   adhesive use limited   cleansing with dimethicone incontinence wipes  Intervention: Prevent Infection  Recent Flowsheet Documentation  Taken 04/11/2024 0800 by Debby Jessie BRAVO, RN  Infection Prevention:   environmental surveillance performed   equipment surfaces disinfected   personal protective equipment utilized   hand hygiene promoted   rest/sleep promoted   single patient room provided     Problem: Breathing Pattern Ineffective  Goal: Effective Breathing Pattern  Intervention: Promote Improved Breathing Pattern  Recent Flowsheet Documentation  Taken 04/11/2024 1200 by Debby Jessie BRAVO, RN  Head of Bed [facility]) Positioning: HOB at 30-45 degrees  Taken 04/11/2024 0800 by Debby Jessie BRAVO, RN  Head of Bed [facility]) Positioning: HOB at 30-45 degrees     Problem: Fall Injury Risk  Goal: Absence of Fall and Fall-Related Injury  Intervention: Promote Injury-Free  Environment  Recent Flowsheet Documentation  Taken 04/11/2024 0800 by Freida Nebel E, RN  Safety Interventions:   aspiration precautions   bed alarm   bleeding precautions   enteral feeding safety   environmental modification   fall reduction program maintained   infection management   lighting adjusted for tasks/safety   low bed   no IV/BP/blood draw left arm   room near unit station     Problem: Gas Exchange Impaired  Goal: Optimal Gas Exchange  Intervention: Optimize Oxygenation and Ventilation  Recent Flowsheet Documentation  Taken 04/11/2024 1200 by Debby Jessie BRAVO, RN  Head of Bed [facility]) Positioning: HOB at 30-45 degrees  Taken 04/11/2024 0800 by Debby Jessie BRAVO, RN  Head of Bed [facility]) Positioning: HOB at 30-45 degrees     Problem: Non-Violent Restraints  Intervention: Utilize least restrictive measures  Recent Flowsheet Documentation  Taken 04/11/2024 1200 by Debby Jessie BRAVO, RN  Less Restrictive Alternative: Repositioning  Taken 04/11/2024 1050 by Debby Jessie BRAVO, RN  Less Restrictive Alternative: Repositioning  Taken 04/11/2024 1000 by Debby Jessie BRAVO, RN  Less Restrictive Alternative: Repositioning  Taken 04/11/2024 0800 by Debby Jessie BRAVO, RN  Less Restrictive Alternative: Repositioning  Intervention: Patient Monitoring  Recent Flowsheet Documentation  Taken 04/11/2024 1200 by Debby Jessie BRAVO, RN  Psychological Status/Visual Check: Subdued  Circulation/Skin Integrity: No signs of injury  Range of Motion: Performed  Fluids: NPO  Food/Meal: Enteral feeding/TPN  Elimination: Rectal tube  Taken 04/11/2024 1050 by Debby Jessie BRAVO, RN  Psychological Status/Visual Check: Subdued  Circulation/Skin Integrity: No signs of injury  Range of Motion: Performed  Fluids: NPO  Food/Meal: Enteral feeding/TPN  Elimination: Rectal tube  Taken 04/11/2024 1000 by Debby Jessie BRAVO, RN  Psychological Status/Visual Check: Subdued  Circulation/Skin Integrity: No signs of injury  Range of Motion: Performed  Fluids: NPO  Food/Meal: Enteral feeding/TPN  Elimination: Rectal tube  Taken 04/11/2024 0800 by Debby Jessie BRAVO, RN  Psychological Status/Visual Check: Subdued  Circulation/Skin Integrity: No signs of injury  Range of Motion: Performed  Fluids: NPO  Food/Meal: Enteral feeding/TPN  Elimination: Rectal tube  Intervention: Patient Education  Recent Flowsheet Documentation  Taken 04/11/2024 1050 by Debby Jessie BRAVO, RN  Criteria Explained: Yes  Patient's Response: Needs reinforcement  Family Notification: Other     Problem: Wound  Goal: Absence of Infection Signs and Symptoms  Intervention: Prevent or Manage Infection  Recent Flowsheet Documentation  Taken 04/11/2024 0800 by Debby Jessie BRAVO, RN  Infection Management: aseptic technique maintained  Goal: Skin Health and Integrity  Intervention: Optimize Skin Protection  Recent Flowsheet Documentation  Taken 04/11/2024 1200 by Debby Jessie BRAVO, RN  Head of Bed [facility]) Positioning: HOB at 30-45 degrees  Taken 04/11/2024 0800 by Debby Jessie BRAVO, RN  Pressure Reduction Techniques:   frequent weight shift encouraged   heels elevated off bed   weight shift assistance provided  Head of Bed (HOB) Positioning: HOB at 30-45 degrees  Pressure Reduction Devices: pressure-redistributing mattress utilized  Skin Protection:   adhesive use limited   cleansing with dimethicone incontinence wipes     Problem: Mechanical Ventilation Invasive  Goal: Optimal Device Function  Intervention: Optimize Device Care and Function  Recent Flowsheet Documentation  Taken 04/11/2024 1200 by Debby Jessie BRAVO, RN  Oral Care:   mouth swabbed   suction provided  Taken 04/11/2024 0800 by Debby Jessie BRAVO, RN  Oral Care:   mouth swabbed   suction provided   teeth brushed   tongue brushed  Goal: Absence of Device-Related Skin and Tissue Injury  Intervention: Maintain Skin and Tissue Health  Recent Flowsheet Documentation  Taken 04/11/2024 0800 by Debby Jessie BRAVO, RN  Device Skin Pressure Protection:   absorbent pad utilized/changed   adhesive use limited  Goal: Absence of Ventilator-Induced Lung Injury  Intervention: Prevent Ventilator-Associated Pneumonia  Recent Flowsheet Documentation  Taken 04/11/2024 1200 by Debby Jessie BRAVO, RN  Head of Bed [facility]) Positioning: HOB at 30-45 degrees  Oral Care:   mouth swabbed   suction provided  Taken 04/11/2024 0800 by Debby Jessie BRAVO, RN  Head of Bed [facility]) Positioning: HOB at 30-45 degrees  Oral Care:   mouth swabbed   suction provided   teeth brushed   tongue brushed     Problem: Mechanical Ventilation Invasive  Goal: Optimal Device Function  Intervention: Optimize Device Care and Function  Recent Flowsheet Documentation  Taken 04/11/2024 1200 by Ovila Lepage E, RN  Oral Care:   mouth swabbed   suction provided  Taken 04/11/2024 0800 by Ashlee Bewley E, RN  Oral Care:   mouth swabbed   suction provided   teeth brushed   tongue brushed  Goal: Absence of Device-Related Skin and Tissue Injury  Intervention: Maintain Skin and Tissue Health  Recent Flowsheet Documentation  Taken 04/11/2024 0800 by Cimone Fahey E, RN  Device Skin Pressure Protection:   absorbent pad utilized/changed   adhesive use limited  Goal: Absence of Ventilator-Induced Lung Injury  Intervention: Prevent Ventilator-Associated Pneumonia  Recent Flowsheet Documentation  Taken 04/11/2024 1200 by Akeema Broder E, RN  Head of Bed [facility]) Positioning: HOB at 30-45 degrees  Oral Care:   mouth swabbed   suction provided  Taken 04/11/2024 0800 by Debby Jessie BRAVO, RN  Head of Bed [facility]) Positioning: HOB at 30-45 degrees  Oral Care:   mouth swabbed   suction provided   teeth brushed   tongue brushed     Problem: Mechanical Ventilation Invasive  Goal: Absence of Device-Related Skin and Tissue Injury  Intervention: Maintain Skin and Tissue Health  Recent Flowsheet Documentation  Taken 04/11/2024 0800 by Makaylia Hewett E, RN  Device Skin Pressure Protection:   absorbent pad utilized/changed   adhesive use limited  Goal: Absence of Ventilator-Induced Lung Injury  Intervention: Prevent Ventilator-Associated Pneumonia  Recent Flowsheet Documentation  Taken 04/11/2024 1200 by Gregary Blackard E, RN  Head of Bed [facility]) Positioning: HOB at 30-45 degrees  Oral Care:   mouth swabbed   suction provided  Taken 04/11/2024 0800 by Debby Jessie BRAVO, RN  Head of Bed [facility]) Positioning: HOB at 30-45 degrees  Oral Care:   mouth swabbed   suction provided   teeth brushed   tongue brushed     Problem: Mechanical Ventilation Invasive  Goal: Optimal Device Function  Intervention: Optimize Device Care and Function  Recent Flowsheet Documentation  Taken 04/11/2024 1200 by Alvah Gilder E, RN  Oral Care:   mouth swabbed   suction provided  Taken 04/11/2024 0800 by Felipe Cabell E, RN  Oral Care:   mouth swabbed   suction provided   teeth brushed   tongue brushed  Goal: Absence of Device-Related Skin and Tissue Injury  Intervention: Maintain Skin and Tissue Health  Recent Flowsheet Documentation  Taken 04/11/2024 0800 by Lige Lakeman E, RN  Device Skin Pressure Protection:   absorbent pad utilized/changed   adhesive use limited  Goal: Absence of Ventilator-Induced Lung Injury  Intervention: Prevent Ventilator-Associated Pneumonia  Recent Flowsheet Documentation  Taken 04/11/2024 1200 by  Debby Jessie BRAVO, RN  Head of Bed [facility]) Positioning: HOB at 30-45 degrees  Oral Care:   mouth swabbed   suction provided  Taken 04/11/2024 0800 by Debby Jessie BRAVO, RN  Head of Bed [facility]) Positioning: HOB at 30-45 degrees  Oral Care:   mouth swabbed   suction provided   teeth brushed   tongue brushed     Problem: Infection  Goal: Absence of Infection Signs and Symptoms  Intervention: Prevent or Manage Infection  Recent Flowsheet Documentation  Taken 04/11/2024 0800 by Debby Jessie BRAVO, RN  Infection Management: aseptic technique maintained     Problem: Mechanical Ventilation Invasive  Goal: Optimal Device Function  Intervention: Optimize Device Care and Function  Recent Flowsheet Documentation  Taken 04/11/2024 1200 by Reniah Cottingham E, RN  Oral Care:   mouth swabbed   suction provided  Taken 04/11/2024 0800 by Pearson Picou E, RN  Oral Care:   mouth swabbed   suction provided   teeth brushed   tongue brushed  Goal: Absence of Device-Related Skin and Tissue Injury  Intervention: Maintain Skin and Tissue Health  Recent Flowsheet Documentation  Taken 04/11/2024 0800 by Soma Lizak E, RN  Device Skin Pressure Protection:   absorbent pad utilized/changed   adhesive use limited  Goal: Absence of Ventilator-Induced Lung Injury  Intervention: Prevent Ventilator-Associated Pneumonia  Recent Flowsheet Documentation  Taken 04/11/2024 1200 by Rosalea Withrow E, RN  Head of Bed [facility]) Positioning: HOB at 30-45 degrees  Oral Care:   mouth swabbed   suction provided  Taken 04/11/2024 0800 by Debby Jessie BRAVO, RN  Head of Bed [facility]) Positioning: HOB at 30-45 degrees  Oral Care:   mouth swabbed   suction provided   teeth brushed   tongue brushed     Problem: Mechanical Ventilation Invasive  Goal: Optimal Device Function  Intervention: Optimize Device Care and Function  Recent Flowsheet Documentation  Taken 04/11/2024 1200 by Majesta Leichter E, RN  Oral Care:   mouth swabbed   suction provided  Taken 04/11/2024 0800 by Mckennah Kretchmer E, RN  Oral Care:   mouth swabbed   suction provided   teeth brushed   tongue brushed  Goal: Absence of Device-Related Skin and Tissue Injury  Intervention: Maintain Skin and Tissue Health  Recent Flowsheet Documentation  Taken 04/11/2024 0800 by Toy Samarin E, RN  Device Skin Pressure Protection:   absorbent pad utilized/changed   adhesive use limited  Goal: Absence of Ventilator-Induced Lung Injury  Intervention: Prevent Ventilator-Associated Pneumonia  Recent Flowsheet Documentation  Taken 04/11/2024 1200 by Markes Shatswell E, RN  Head of Bed [facility]) Positioning: HOB at 30-45 degrees  Oral Care:   mouth swabbed   suction provided  Taken 04/11/2024 0800 by Debby Jessie BRAVO, RN  Head of Bed [facility]) Positioning: HOB at 30-45 degrees  Oral Care:   mouth swabbed   suction provided   teeth brushed   tongue brushed     Problem: Mechanical Ventilation Invasive  Goal: Optimal Device Function  Intervention: Optimize Device Care and Function  Recent Flowsheet Documentation  Taken 04/11/2024 1200 by Seba Madole E, RN  Oral Care:   mouth swabbed suction provided  Taken 04/11/2024 0800 by Debby Jessie BRAVO, RN  Oral Care:   mouth swabbed   suction provided   teeth brushed   tongue brushed  Goal: Absence of Device-Related Skin and Tissue Injury  Intervention: Maintain Skin and Tissue Health  Recent Flowsheet Documentation  Taken 04/11/2024 0800 by Ely Spragg E,  RN  Device Skin Pressure Protection:   absorbent pad utilized/changed   adhesive use limited  Goal: Absence of Ventilator-Induced Lung Injury  Intervention: Prevent Ventilator-Associated Pneumonia  Recent Flowsheet Documentation  Taken 04/11/2024 1200 by Tyjae Issa E, RN  Head of Bed [facility]) Positioning: HOB at 30-45 degrees  Oral Care:   mouth swabbed   suction provided  Taken 04/11/2024 0800 by Debby Jessie BRAVO, RN  Head of Bed [facility]) Positioning: HOB at 30-45 degrees  Oral Care:   mouth swabbed   suction provided   teeth brushed   tongue brushed     Problem: Artificial Airway  Goal: Optimal Device Function  Intervention: Optimize Device Care and Function  Recent Flowsheet Documentation  Taken 04/11/2024 1200 by Niyana Chesbro E, RN  Oral Care:   mouth swabbed   suction provided  Taken 04/11/2024 0800 by Debby Jessie BRAVO, RN  Aspiration Precautions:   upright posture maintained   respiratory status monitored   tube feeding placement verified  Oral Care:   mouth swabbed   suction provided   teeth brushed   tongue brushed  Goal: Absence of Device-Related Skin or Tissue Injury  Intervention: Maintain Skin and Tissue Health  Recent Flowsheet Documentation  Taken 04/11/2024 0800 by Alaiah Lundy E, RN  Device Skin Pressure Protection:   absorbent pad utilized/changed   adhesive use limited

## 2024-04-11 NOTE — Progress Notes (Signed)
 MICU Daily Progress Note     Date of Service: 04/11/2024    Problem List:   Principal Problem:    Bacteremia, escherichia coli  Active Problems:    Alcoholic liver disease (HHS-HCC)    HTN (hypertension)    Depression with anxiety    Class 2 severe obesity with serious comorbidity in adult    Atrial fibrillation    (CMS-HCC)    Pleural effusion    Hypernatremia    Thrombocytopenia    Cirrhosis    (CMS-HCC)    A-fib (CMS-HCC)    Hepatic encephalopathy    (CMS-HCC)      Interval history: [name] is a 78 y.o. female with HFpEF, HTN, Afib on AC, MASLD cirrhosis, originally hospitalized for scheduled TEE/DCCV (aborted d/t LAA clot) c/b AHHRF, encephalopathy and septic shock requiring MICU care, was s/p extubation transferred to [facility] for further management of anticoagulation and hypernatremia. Transferred back to [facility] MICU for closer monitoring for respiratory status, pressor requirement, GI bleed, and management of carotid artery injury.     On 10/24, patient had acute worsening of respiratory status leading requiring intubation. When patient was given sedation for intubation, she coded and promptly had ROSC after a few minutes of CPR. Subsequently, CVC placed in neck, used to draw blood, unclear if given pressors through it, then found  to be in arterial system based on blood gas and imaging, likely right carotid. Line was subsequently removed, and pressure was held for 15 minutes, hemostasis with no hematoma. She was transferred to [facility] MICU for management of this carotid injury, to be evaluated by vascular surgery while also managing her increased vasopressor requirement and respiratory distress.  Has been weaned off pressors since 10/27 at 1 PM. VBG's indicating that she is ready for extubation, however her mentation suggests that she is not able to protect her airway.  She was extubated on 10/31, and mentation slowly improved.  Unfortunately on the morning of 11/2 she had increased lethargy, increased secretions, and she was not protecting her airway.  She was reintubated on 11/2, and required pressor support with norepinephrine and vasopressin after the induction agents.  Self extubated on 11/5. Re intubated on 11/6 due to hypercarbia and increased work of breathing.  We are left with the challenging options regarding the care of [name].  Due to her multiple failed extubations, we are concerned [name] will need a tracheostomy long-term to protect her airway.  However, we are not sure if she would favor a palliative approach.  Ongoing discussions with family regarding next steps.  Currently attempting to schedule a family meeting with extended family.    24 hour events:  - Wean Precedex in lieu of Haldol, fentanyl  pushes.  - Ongoing fever, infectious workup initiated, c/f SBP vs HAP   - Ordered LRCx   - POCUS did not reveal a safe fluid pocket   - Continue ceftriaxone  - Gave albumin due to concern for SBP  - Resumed heparin drip for anticoagulation   - Palliative care consult, plans to schedule family meeting on 11/10, timing TBD    Neurological   Hepatic encephalopathy  Per chart review at [facility], rapid response called on 10/24 for increased somnolence. At the time, differential included hepatic encephalopathy, toxic metabolic encephalopathy in the setting of shock (possibly sepsis), versus intracranial pathology. She was subsequently intubated. CT head on 10/24 showed no acute intracranial abnormality. Weaned off sedation with slow improvements in neurological status.  While we  have continued lactulose  therapy and seeing good response with several bowel movements, her mentation remains poor.  This is concerning for severe ICU delirium.  Meeting was had with daughter, [name], on 11/5.  Described that should intubation be necessary we would likely be moving towards a tracheostomy as [name] has continuously not been protecting her airway.  As of now [name] wanted to pursue aggressive measures including repeat intubation and potential tracheostomy.  However she stated that she will talk it over with her sister and the rest of her family.  - Continue lactulose , target 3-5 BMs daily.  MRI brain was impeded due to movement, however with we saw was unremarkable.  - Continue lactulose  and rifaximin      Analgesia: No pain issues  RASS at goal? N/A, not on sedation  [place] Agitation Assessment Scale (RASS) : -1 (04/11/2024  4:00 AM)    Pulmonary   Acute Hypoxic Hypercarbic Respiratory Failure   Rapid response called on 10/24 for increased somnolence, found to have acute hypercarbic respiratory failure and subsequently intubated. Suspect hypercarbic respiratory failure due to encephalopathy as above. CXR on 10/24 showed worsening pleural effusions. Not currently getting diuresed. Given patient's hypoxia, and AMS, there was concern for infectious etiology. MRSA nares negative. Infectious workup thus far is unremarkable. SBTs show readiness extubation based on VBGs, however there is concern that she continues to be unable to protect her airway due to her current mentation.  Reintubated on 11/2 followed by therapeutic bronchoscopy.  Self extubated on 11/5.  Reintubate noted on 11/6 due to hypercarbia and not protecting her airway.   - Pall care c/s for weigh in on discussion re: trach 11/11      Cardiovascular   Carotid Artery Injury (resolved)  At [facility], CVC placed intended for RIJ on 10/24 following intubation, PEA, ROSC. Was used with known blood return. Unclear if it was used for pressor support, but highly likely that it was. CXR for placement check showed concern for intra aterial location, and blood gas confirmed it was arterial. Thought to be in carotid artery. CVC removed with pressure held for 15 minutes, with no hematoma formation. On arrival, no physical exam and bedside ultrasound showed no large hematoma. No active bleeding.   - Vascular Surgery consulted, stated fistula has healed based on Ultrasound findings    PEA arrest s/p ROSC  Hypovolemic/Hemorrhagic shock (resolved)  Atrial fibrillation  known left atrial appendage clot  HFpEF  Patient with PEA arrest peri-intubation with ROSC. Shock thought to be hemorrhagic in the setting of GI bleed History of atrial fibrillation with known LAA clot and HFpEF. Vasopressors were weaned off 10/27. APTT was elevated the morning of 10/28 necessitating changing anticoagulation from therapeutic heparin to therapeutic Lovenox .   - Holding Coreg   - Started heparin drip for anticoagulation for LAA clot    Renal   Hypernatremia   At [facility], had persistently hypernatremia near 153. Had been treated with D5 gtt. Sodium throughout admission has largely been 146-148. Sodium was 151 morning of 10/28, presume this will decrease once we start tube feeds.  Sodium has overall normalized and continues to be within normal limits.  - Strict I/O's  - Sodium checks  - FWF with TF, adjusting as needed    Infectious Disease/Autoimmune   E. Coli Bacteremia (resolved),   Initially admitted for afib, found to have E. coli bacteremia on October 10. This was treated with cefazolin . Rapid response called 10/24, patient noted to be hypothermic. WBCs jumped from 3.0 to  9.7 on 10/24. Although the most recent lab was following intubation and CPR. UA with pyuria, rare bacteria, hematuria. Peri-intubation, patient developed significant hypotension requiring initiation of NE and vasopressin. Suspect possible septic shock with unknown infectious source. Patient started on vancomycin and cefepime 10/24. CT Abdomen pelvis concerning for aspiration pneumonia. Following infectious workup was unremarkable. Antibiotics were stopped on 10/27.  Infectious workup was repeated on 11/3 due to reintubation and status post bronchoscopy.  - follow blood, urine cultures to final, so far no growth to date   - Repeat infectious w/u 11/8    Cultures:  Blood Culture, Routine (no units)   Date Value   04/09/2024 No Growth at 24 hours   04/09/2024 No Growth at 24 hours     Urine Culture, Comprehensive (no units)   Date Value   04/09/2024 NO GROWTH   04/04/2024 NO GROWTH     Lower Respiratory Culture (no units)   Date Value   04/04/2024 OROPHARYNGEAL FLORA ISOLATED   03/29/2024 Specimen Not Processed     WBC (10*9/L)   Date Value   04/11/2024 4.0     WBC, UA (/HPF)   Date Value   04/10/2024 7 (H)       FEN/GI   Sigmoid Mass c/f Malignancy and Hematochezia (resolved)  Hemorrhagic Shock (resolved)  CT abdomen pelvis done 10/24 with evidence of cirrhosis and masslike thickening of the lower sigmoid colon concerning for malignancy.  CTA abdomen pelvis performed 1024 with active extravasation into the gastric fundus possibly secondary to traumatic enteric tube placement.  GI scoped the patient on 10/25 and clipped an area of active bleeding.  They removed a total of 1.5 L of blood.  Hemoglobin 10/20 6 AM downtrended to 6.0, so 2 units of blood transfused.  GI stated this was consistent with 10/25 scope and likely did not represent increased bleeding. Hb steadily improving with Hb of 9.2 on 10/28.   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 Given shock of unclear etiology. CTAP demonstrating cirrhosis. Likely that cirrhosis is contributing to hypotension.   - Continue lactulose ; target 3-5 BMs daily  - NGT in place, on TF and FWF  - Daily MELD labs and CMP     MELD 3.0: 15 at 04/05/2024  4:31 AM  MELD-Na: 13 at 04/05/2024  4:31 AM  Calculated from:  Serum Creatinine: 0.43 mg/dL (Using min of 1 mg/dL) at 88/09/7972  5:68 AM  Serum Sodium: 143 mmol/L (Using max of 137 mmol/L) at 04/05/2024  4:31 AM  Total Bilirubin: 1.3 mg/dL at 88/09/7972  5:68 AM  Serum Albumin: 2.4 g/dL at 88/09/7972  5:68 AM  INR(ratio): 1.61 at 04/03/2024 10:26 AM  Age at listing (hypothetical): 78 years  Sex: Female at 04/05/2024  4:31 AM    Provider Malnutrition Assessment:  Body mass index is 39.22 kg/m??. BMI Interpretation: >/= 30 and < 40, consistent with obesity, clinically significant requiring additional resources and complicating multiple aspects of patient care.  GLIM criteria:   Pt does not meet criteria  -I have screened this patient for malnutrition and they did NOT meet criteria for malnutrition based on GLIM criteria.  -TF and FWF; Dietician consulted, appreciate assistance    Heme/Coag   Acute blood loss anemia  Hemoglobin down trended to 6.0 on the morning of 10/26 thought  to be secondary to acute GI bleed that was scoped and clipped 10/25. Patient received 2 units PRBC 10/26 and an additional unit 10/27. Hb steadily improving with Hb of 9.2 on 10/28.   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 Reason: Not indicated.    Prophylaxis/LDA/Restraints/Consults   ICU Checklist completed: yes (see ICU rounding navigator in Epic)    Patient Lines/Drains/Airways Status       Active Active Lines, Drains, & Airways       Name Placement date Placement time Site Days    ETT  7.5 04/07/24  1259  -- 3    NG/OG Tube Feedings 10 Fr. Right nostril 04/01/24  1138  Right nostril  9    Urethral Catheter Temperature probe 16 Fr. 04/09/24  1030  Temperature probe  1    PICC Double Lumen 04/06/24 Left Basilic 04/06/24  1558  Basilic  4    Arterial Line 04/03/24 Right Radial 04/03/24  0830  Radial  7                  Patient Lines/Drains/Airways Status       Active Wounds       Name Placement date Placement time Site Days    Wound 03/01/24 Other (Comment) Toe (Comment which one) Left;Other (Comment) Blister present on arrival, tx already in outpatient setting, in process of healing, surrounding erythema 03/01/24  0129  Toe (Comment which one)  41    Wound 03/13/24 Irritant Contact Dermatitis Incontinence Sacrum Mid gluteal cleft MASD? 03/13/24  --  Sacrum  29                  Goals of Care     Code Status:   Orders Placed This Encounter   Procedures    Full Code     Standing Status:   Standing     Number of Occurrences:   1        Designated Healthcare Decision Maker:  Ms. Dowson designated healthcare decision maker(s) is/are   HCDM (patient stated preference): Whan,[name] - Spouse - [phone]    HCDM, First AlternateShaely, [place] - Daughter - [phone]. See HCDM section of Epic sidebar/storyboard or ACP tab in patient chart for details regarding active HCDMs and patient capacity for decision-making.      Subjective     Patient intubated this morning on my exam. Follows commands occasionally and waves hello     Objective     Vitals - past 24 hours  Pulse:  [91-137] 112  SpO2 Pulse:  [92-139] 112  Resp:  [15-38] 20  FiO2 (%):  [30 %] 30 %  SpO2:  [94 %-100 %] 100 % Intake/Output  I/O last 3 completed shifts:  In: 4801.8 [P.O.:60; I.V.:341.8; NG/GT:3100; IV Piggyback:1300]  Out: 3155 [Urine:1805; Stool:1350]     Physical Exam:    General: intubated on my examination. Arouses to voice, answers yes/no questions  HEENT: normocephalic, atraumatic   CV: pulses palpable, regular borderline tachycardia   Pulm: Rhonchi w transmitted upper airway sounds throughout  GI: soft, NTND, + BS  MSK: edema noted to her knees bilaterally, no clubbing or cyanosis  Skin: numerous large flat violaceous ecchymosis on left arm, chest, neck bilaterally.  Neuro: Withdrawals to painful stimuli. Responding to name and reliably following commands.     Continuous Infusions:   Infusions Meds[1]    Scheduled Medications:   Scheduled Medications[2]    PRN medications:  PRN Medications[3]    Data/Imaging Review: Reviewed in Epic and personally interpreted on 04/11/2024. See EMR for detailed results.      Amoree Newlon S Kaidyn Hernandes, MD  PGY1 Internal Medicine, [facility]             [1]    NORepinephrine bitartrate-NS Stopped (04/10/24 2130)   [2]    [Provider Hold] apixaban  5 mg Enteral tube: gastric BID    cefTRIAXone  2 g Intravenous Q24H    cyanocobalamin (vitamin B-12)  1,000 mcg Enteral tube: gastric Daily    enoxaparin (LOVENOX) injection  40 mg Subcutaneous Q24H SCH    esomeprazole  40 mg Enteral tube: gastric Daily    flu vac 2025 [id number](PF)  0.5 mL Intramuscular During hospitalization    folic acid   1 mg Enteral tube: gastric Daily    lactulose   20 g Enteral tube: gastric TID    [Provider Hold] metoPROLOL tartrate  25 mg Enteral tube: gastric BID    multivitamins (ADULT)  1 tablet Enteral tube: gastric Daily    potassium phosphate  30 mmol Intravenous Once    rifAXIMin   550 mg Enteral tube: gastric BID    sodium chloride   10 mL Intravenous Q8H    sodium chloride   10 mL Intravenous Q8H    thiamine  mononitrate (vit B1)  100 mg Enteral tube: gastric Daily   [3] acetaminophen , docusate sodium, fentaNYL  (PF) **OR** fentaNYL  (PF), haloperidol LACTATE **OR** [DISCONTINUED] haloperidol LACTATE

## 2024-04-12 LAB — BLOOD GAS CRITICAL CARE PANEL, ARTERIAL
BASE EXCESS ARTERIAL: 5.6 — ABNORMAL HIGH (ref -2.0–2.0)
BASE EXCESS ARTERIAL: 4.4 — ABNORMAL HIGH (ref -2.0–2.0)
BASE EXCESS ARTERIAL: 3.6 — ABNORMAL HIGH (ref -2.0–2.0)
BASE EXCESS ARTERIAL: 3.9 — ABNORMAL HIGH (ref -2.0–2.0)
CALCIUM IONIZED ARTERIAL (MG/DL): 4.49 mg/dL (ref 4.40–5.40)
CALCIUM IONIZED ARTERIAL (MG/DL): 4.5 mg/dL (ref 4.40–5.40)
CALCIUM IONIZED ARTERIAL (MG/DL): 4.73 mg/dL (ref 4.40–5.40)
CALCIUM IONIZED ARTERIAL (MG/DL): 4.68 mg/dL (ref 4.40–5.40)
CARBOXYHEMOGLOBIN: 1.6 % — ABNORMAL HIGH (ref <1.2)
CARBOXYHEMOGLOBIN: 1.8 % — ABNORMAL HIGH (ref <1.2)
CARBOXYHEMOGLOBIN: 2.4 % — ABNORMAL HIGH (ref <1.2)
CARBOXYHEMOGLOBIN: 2.4 % — ABNORMAL HIGH (ref <1.2)
CHLORIDE, WHOLE BLOOD: 107 mmol/L (ref 98–107)
CHLORIDE, WHOLE BLOOD: 107 mmol/L (ref 98–107)
GLUCOSE WHOLE BLOOD: 140 mg/dL (ref 70–179)
GLUCOSE WHOLE BLOOD: 136 mg/dL (ref 70–179)
GLUCOSE WHOLE BLOOD: 130 mg/dL (ref 70–179)
GLUCOSE WHOLE BLOOD: 131 mg/dL (ref 70–179)
HCO3 ARTERIAL: 29 mmol/L — ABNORMAL HIGH (ref 22–27)
HCO3 ARTERIAL: 28 mmol/L — ABNORMAL HIGH (ref 22–27)
HCO3 ARTERIAL: 27 mmol/L (ref 22–27)
HCO3 ARTERIAL: 28 mmol/L — ABNORMAL HIGH (ref 22–27)
HEMOGLOBIN BLOOD GAS: 6.7 g/dL — ABNORMAL LOW (ref 12.00–16.00)
HEMOGLOBIN BLOOD GAS: 7.7 g/dL — ABNORMAL LOW (ref 12.00–16.00)
HEMOGLOBIN BLOOD GAS: 8.3 g/dL — ABNORMAL LOW (ref 12.00–16.00)
HEMOGLOBIN BLOOD GAS: 8.1 g/dL — ABNORMAL LOW (ref 12.00–16.00)
LACTATE BLOOD ARTERIAL: 1.1 mmol/L (ref <1.3)
LACTATE BLOOD ARTERIAL: 1.1 mmol/L (ref <1.3)
LACTATE BLOOD ARTERIAL: 0.8 mmol/L (ref <1.3)
LACTATE BLOOD ARTERIAL: 1.1 mmol/L (ref <1.3)
METHEMOGLOBIN: <1 % (ref <1.5)
METHEMOGLOBIN: <1 % (ref <1.5)
METHEMOGLOBIN: <1 % (ref <1.5)
METHEMOGLOBIN: <1 % (ref <1.5)
O2 SATURATION ARTERIAL: 99.5 % (ref 94.0–100.0)
O2 SATURATION ARTERIAL: 99.4 % (ref 94.0–100.0)
O2 SATURATION ARTERIAL: 98.5 % (ref 94.0–100.0)
O2 SATURATION ARTERIAL: 99.5 % (ref 94.0–100.0)
OXYHEMOGLOBIN: 97.5 % (ref 94.0–100.0)
OXYHEMOGLOBIN: 97.7 % (ref 94.0–100.0)
OXYHEMOGLOBIN: 95.7 % (ref 94.0–100.0)
OXYHEMOGLOBIN: 96.5 % (ref 94.0–100.0)
PCO2 ARTERIAL: 38.5 mmHg (ref 35.0–45.0)
PCO2 ARTERIAL: 38.9 mmHg (ref 35.0–45.0)
PCO2 ARTERIAL: 39.3 mmHg (ref 35.0–45.0)
PCO2 ARTERIAL: 39 mmHg (ref 35.0–45.0)
PH ARTERIAL: 7.49 — ABNORMAL HIGH (ref 7.35–7.45)
PH ARTERIAL: 7.47 — ABNORMAL HIGH (ref 7.35–7.45)
PH ARTERIAL: 7.46 — ABNORMAL HIGH (ref 7.35–7.45)
PH ARTERIAL: 7.46 — ABNORMAL HIGH (ref 7.35–7.45)
PO2 ARTERIAL: 114 mmHg — ABNORMAL HIGH (ref 80.0–110.0)
PO2 ARTERIAL: 110 mmHg (ref 80.0–110.0)
PO2 ARTERIAL: 96 mmHg (ref 80.0–110.0)
PO2 ARTERIAL: 116 mmHg — ABNORMAL HIGH (ref 80.0–110.0)
POTASSIUM WHOLE BLOOD: 3.5 mmol/L (ref 3.4–4.6)
POTASSIUM WHOLE BLOOD: 3.3 mmol/L — ABNORMAL LOW (ref 3.4–4.6)
POTASSIUM WHOLE BLOOD: 3.3 mmol/L — ABNORMAL LOW (ref 3.4–4.6)
POTASSIUM WHOLE BLOOD: 3.4 mmol/L (ref 3.4–4.6)
SODIUM WHOLE BLOOD: 145 mmol/L (ref 135–145)
SODIUM WHOLE BLOOD: 145 mmol/L (ref 135–145)
SODIUM WHOLE BLOOD: 144 mmol/L (ref 135–145)
SODIUM WHOLE BLOOD: 143 mmol/L (ref 135–145)

## 2024-04-12 LAB — CBC W/ AUTO DIFF
BASOPHILS ABSOLUTE COUNT: 0 10*9/L (ref 0.0–0.1)
BASOPHILS ABSOLUTE COUNT: 0 10*9/L (ref 0.0–0.1)
BASOPHILS RELATIVE PERCENT: 0.9 %
BASOPHILS RELATIVE PERCENT: 0.9 %
EOSINOPHILS ABSOLUTE COUNT: 0.1 10*9/L (ref 0.0–0.5)
EOSINOPHILS ABSOLUTE COUNT: 0.1 10*9/L (ref 0.0–0.5)
EOSINOPHILS RELATIVE PERCENT: 4.6 %
EOSINOPHILS RELATIVE PERCENT: 3.6 %
HEMATOCRIT: 21 % — ABNORMAL LOW (ref 34.0–44.0)
HEMATOCRIT: 25.2 % — ABNORMAL LOW (ref 34.0–44.0)
HEMOGLOBIN: 6.8 g/dL — ABNORMAL LOW (ref 11.3–14.9)
HEMOGLOBIN: 8.5 g/dL — ABNORMAL LOW (ref 11.3–14.9)
LYMPHOCYTES ABSOLUTE COUNT: 0.6 10*9/L — ABNORMAL LOW (ref 1.1–3.6)
LYMPHOCYTES ABSOLUTE COUNT: 0.7 10*9/L — ABNORMAL LOW (ref 1.1–3.6)
LYMPHOCYTES RELATIVE PERCENT: 21.3 %
LYMPHOCYTES RELATIVE PERCENT: 19.7 %
MEAN CORPUSCULAR HEMOGLOBIN CONC: 32.5 g/dL (ref 32.0–36.0)
MEAN CORPUSCULAR HEMOGLOBIN CONC: 33.6 g/dL (ref 32.0–36.0)
MEAN CORPUSCULAR HEMOGLOBIN: 32.1 pg (ref 25.9–32.4)
MEAN CORPUSCULAR HEMOGLOBIN: 32.9 pg — ABNORMAL HIGH (ref 25.9–32.4)
MEAN CORPUSCULAR VOLUME: 98.9 fL — ABNORMAL HIGH (ref 77.6–95.7)
MEAN CORPUSCULAR VOLUME: 98 fL — ABNORMAL HIGH (ref 77.6–95.7)
MEAN PLATELET VOLUME: 8.3 fL (ref 6.8–10.7)
MEAN PLATELET VOLUME: 8.6 fL (ref 6.8–10.7)
MONOCYTES ABSOLUTE COUNT: 0.4 10*9/L (ref 0.3–0.8)
MONOCYTES ABSOLUTE COUNT: 0.4 10*9/L (ref 0.3–0.8)
MONOCYTES RELATIVE PERCENT: 13.1 %
MONOCYTES RELATIVE PERCENT: 12.3 %
NEUTROPHILS ABSOLUTE COUNT: 1.7 10*9/L — ABNORMAL LOW (ref 1.8–7.8)
NEUTROPHILS ABSOLUTE COUNT: 2.3 10*9/L (ref 1.8–7.8)
NEUTROPHILS RELATIVE PERCENT: 60.1 %
NEUTROPHILS RELATIVE PERCENT: 63.5 %
PLATELET COUNT: 139 10*9/L — ABNORMAL LOW (ref 150–450)
PLATELET COUNT: 131 10*9/L — ABNORMAL LOW (ref 150–450)
RED BLOOD CELL COUNT: 2.12 10*12/L — ABNORMAL LOW (ref 3.95–5.13)
RED BLOOD CELL COUNT: 2.57 10*12/L — ABNORMAL LOW (ref 3.95–5.13)
RED CELL DISTRIBUTION WIDTH: 22.5 % — ABNORMAL HIGH (ref 12.2–15.2)
RED CELL DISTRIBUTION WIDTH: 21 % — ABNORMAL HIGH (ref 12.2–15.2)
WBC ADJUSTED: 2.8 10*9/L — ABNORMAL LOW (ref 3.6–11.2)
WBC ADJUSTED: 3.6 10*9/L (ref 3.6–11.2)

## 2024-04-12 LAB — APTT
APTT: 81.3 s — ABNORMAL HIGH (ref 24.8–38.4)
HEPARIN CORRELATION: 0.5

## 2024-04-12 LAB — BASIC METABOLIC PANEL
ANION GAP: 12 mmol/L (ref 5–14)
BLOOD UREA NITROGEN: 17 mg/dL (ref 9–23)
BUN / CREAT RATIO: 38
CALCIUM: 8.5 mg/dL — ABNORMAL LOW (ref 8.7–10.4)
CHLORIDE: 106 mmol/L (ref 98–107)
CO2: 28 mmol/L (ref 20.0–31.0)
CREATININE: 0.45 mg/dL — ABNORMAL LOW (ref 0.55–1.02)
EGFR CKD-EPI (2021) FEMALE: >90 mL/min/1.73m2 (ref >=60)
GLUCOSE RANDOM: 136 mg/dL (ref 70–179)
POTASSIUM: 3.6 mmol/L (ref 3.4–4.8)
SODIUM: 146 mmol/L — ABNORMAL HIGH (ref 135–145)

## 2024-04-12 LAB — HEPATIC FUNCTION PANEL
ALBUMIN: 3.7 g/dL (ref 3.4–5.0)
ALKALINE PHOSPHATASE: 137 U/L — ABNORMAL HIGH (ref 46–116)
ALT (SGPT): 11 U/L (ref 10–49)
AST (SGOT): 32 U/L (ref <=34)
BILIRUBIN DIRECT: 0.7 mg/dL — ABNORMAL HIGH (ref 0.00–0.30)
BILIRUBIN TOTAL: 1.2 mg/dL (ref 0.3–1.2)
PROTEIN TOTAL: 5.9 g/dL (ref 5.7–8.2)

## 2024-04-12 LAB — SLIDE REVIEW

## 2024-04-12 LAB — PHOSPHORUS: PHOSPHORUS: 2.6 mg/dL (ref 2.4–5.1)

## 2024-04-12 LAB — MAGNESIUM: MAGNESIUM: 1.9 mg/dL (ref 1.6–2.6)

## 2024-04-12 LAB — CYSTATIN C
CYSTATIN C: 1.49 mg/L — ABNORMAL HIGH (ref 0.64–1.23)
EGFR CKD-EPI (2012) CYSTATIN C FEMALE: 40 mL/min/1.73m2 — ABNORMAL LOW (ref >=60)

## 2024-04-12 MED ADMIN — sodium chloride (NS) 0.9 % flush 10 mL: 10 mL | INTRAVENOUS | @ 07:00:00

## 2024-04-12 MED ADMIN — sodium chloride (NS) 0.9 % flush 10 mL: 10 mL | INTRAVENOUS | @ 16:00:00

## 2024-04-12 MED ADMIN — sodium chloride (NS) 0.9 % flush 10 mL: 10 mL | INTRAVENOUS

## 2024-04-12 MED ADMIN — rifAXIMin (XIFAXAN) oral suspension: 550 mg | GASTROENTERAL | @ 14:00:00 | Stop: 2025-04-11

## 2024-04-12 MED ADMIN — rifAXIMin (XIFAXAN) oral suspension: 550 mg | GASTROENTERAL | @ 02:00:00 | Stop: 2025-04-11

## 2024-04-12 MED ADMIN — folic acid (FOLVITE) tablet 1 mg: 1 mg | GASTROENTERAL | @ 14:00:00

## 2024-04-12 MED ADMIN — thiamine mononitrate (vit B1) tablet 100 mg: 100 mg | GASTROENTERAL | @ 14:00:00

## 2024-04-12 MED ADMIN — cyanocobalamin (vitamin B-12) tablet 1,000 mcg: 1000 ug | GASTROENTERAL | @ 14:00:00

## 2024-04-12 MED ADMIN — lactulose oral solution: 20 g | GASTROENTERAL | @ 14:00:00

## 2024-04-12 MED ADMIN — lactulose oral solution: 20 g | GASTROENTERAL | @ 20:00:00

## 2024-04-12 MED ADMIN — lactulose oral solution: 20 g | GASTROENTERAL | @ 02:00:00

## 2024-04-12 MED ADMIN — esomeprazole (NEXIUM) granules 40 mg: 40 mg | GASTROENTERAL | @ 14:00:00

## 2024-04-12 MED ADMIN — multivitamins, therapeutic with minerals tablet 1 tablet: 1 | GASTROENTERAL | @ 14:00:00

## 2024-04-12 MED ADMIN — acetaminophen (TYLENOL) tablet 650 mg: 650 mg | ORAL | @ 17:00:00

## 2024-04-12 MED ADMIN — cefTRIAXone (ROCEPHIN) 2 g in sodium chloride 0.9 % (NS) 100 mL IVPB-MBP: 2 g | INTRAVENOUS | @ 16:00:00 | Stop: 2024-04-15

## 2024-04-12 MED ADMIN — melatonin tablet 3 mg: 3 mg | ORAL | @ 07:00:00

## 2024-04-12 MED ADMIN — melatonin tablet 3 mg: 3 mg | ORAL | @ 22:00:00

## 2024-04-12 MED ADMIN — heparin 25,000 Units/250 mL (100 units/mL) in 0.45% saline infusion (premade): 0-24 [IU]/kg/h | INTRAVENOUS | @ 12:00:00

## 2024-04-12 NOTE — Consults (Signed)
 Palliative Care Progress Note        Consultation from Requesting Attending Physician:  [name] Lang Early, MD  Primary Care Provider:  Alyse Slater Pao, MD      Assessment/Plan:      SUMMARY:  This 78 y.o. patient is seriously ill due to repeated failed extubations with prolonged need for ventilation after originally being hospitalized for scheduled TEE/DCCV (aborted d/t LAA clot) c/b AHHRF, encephalopathy and septic shock requiring MICU care, also complicated by co-morbid acute and chronic conditions including carotid injury, HFpEF, HTN, Afib on AC, and MASLD cirrhosis.    Today's visit addressed GOC meeting planning.    Symptom Assessment and Recommendations:    Per primary team.    Goals of care / ACP:  Code Status:   Code Status: Full Code   Healthcare decision-maker if lacks capacity:   HCDM (patient stated preference): [name],[name] - Spouse - [phone]    HCDM, First Alternate: [name],[name] - Daughter - [phone]     Goals of care as discussed on 11/10:  - [name]'s family shares this has been a long and difficult hospitalization, with lots of unexpected decompensations  - They share that before this hospitalization, [name] was doing well at home.  She was independent.  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  This is a MAJOR change for her.  - They would like to talk about the risks, benefits, and alternatives to tracheostomy at a meeting together with the medical team on Wednesday.  [name] is going to call her sister [name] to discuss what time they are able to meet.  - The ICU team shares that in the past, there have been questions from the family about if they would offer extubation and reintubation if indicated for a 5th time (as an alternative to proceeding straight to tracheostomy).  They would likely not offer this due to substantial risks without much benefit.  -> [name] and [name] would like to meet at 12:30 PM on 11/12 -> Communicated to MICU team      Practical, Emotional, Spiritual Support Recommendations:  Patient's daughters would like the patient to be included in decision making as much as possible    Recommendations discussed with primary team in person    Thank you for this consult. Please contact Darryle Dustman MD or page Palliative Care if there are any questions.  Palliative Care team plans to visit this patient again on 11/12.    Subjective:     Recent Events:    Pt is still very awake and responsive this morning, nodding her head yes and no to questions.  Spoke with pt's daughter [name] over the phone, she is waiting to hear back from her sister [name] about timing.      Objective:       Function:  30% - Ambulation: Totally Bed Bound / Unable to do any work, extensive disease / Self-Care: Total care / Intake: Reduced / Level of Conscious: Full, drowsy, or confusion    Temp:  [37.4 ??C (99.3 ??F)-38 ??C (100.4 ??F)] 38 ??C (100.4 ??F)  Pulse:  [91-121] 113  SpO2 Pulse:  [90-120] 106  Resp:  [16-33] 21  BP: (112)/(57) 112/57  FiO2 (%):  [30 %-40 %] 30 %  SpO2:  [94 %-100 %] 99 %    Physical Exam:  General:  Chronically ill appearing woman in NAD  HEENT:  Normocephalic, ET tube in place  Eyes:  EOM intact  Cardiovascular:  Tachycardic rate, regular  rhythm, systolic murmur  Pulmonary:  Rhonchorous breath sounds on ventilator  Gastrointestinal:  Soft and non-tender, +BS  Ext:  Pitting bilateral LEE  Skin:  No rashes on clothed exam  Psych:  Able to intermittently follow simple commands, squeeze hand in response to questions    Testing reviewed and interpreted:  Reviewed and interpreted test results for AKI, anemia affecting assessment of underlying illness severity and prognosis    I personally spent 45 minutes face-to-face and non-face-to-face in the care of this patient, which includes all pre, intra, and post visit time on the date of service.  All documented time was specific to the E/M visit and does not include any procedures that may have been performed.     See ACP Note from today for additional billable service:  No.    Darryle Dustman MD  HPM Fellow

## 2024-04-12 NOTE — Progress Notes (Signed)
 MICU Daily Progress Note     Date of Service: 04/12/2024    Problem List:   Principal Problem:    Bacteremia, escherichia coli  Active Problems:    Alcoholic liver disease (HHS-HCC)    HTN (hypertension)    Depression with anxiety    Class 2 severe obesity with serious comorbidity in adult    Atrial fibrillation    (CMS-HCC)    Pleural effusion    Hypernatremia    Thrombocytopenia    Cirrhosis    (CMS-HCC)    A-fib (CMS-HCC)    Hepatic encephalopathy    (CMS-HCC)      Interval history: [name] is a 78 y.o. female with HFpEF, HTN, Afib on AC, MASLD cirrhosis, originally hospitalized for scheduled TEE/DCCV (aborted d/t LAA clot) c/b AHHRF, encephalopathy and septic shock requiring MICU care, was s/p extubation transferred to [facility] for further management of anticoagulation and hypernatremia. Transferred back to [facility] MICU for closer monitoring for respiratory status, pressor requirement, GI bleed, and management of carotid artery injury.     On 10/24, patient had acute worsening of respiratory status leading requiring intubation. When patient was given sedation for intubation, she coded and promptly had ROSC after a few minutes of CPR. Subsequently, CVC placed in neck, used to draw blood, unclear if given pressors through it, then found  to be in arterial system based on blood gas and imaging, likely right carotid. Line was subsequently removed, and pressure was held for 15 minutes, hemostasis with no hematoma. She was transferred to [facility] MICU for management of this carotid injury, to be evaluated by vascular surgery while also managing her increased vasopressor requirement and respiratory distress.  Has been weaned off pressors since 10/27 at 1 PM. VBG's indicating that she is ready for extubation, however her mentation suggests that she is not able to protect her airway.  She was extubated on 10/31, and mentation slowly improved.  Unfortunately on the morning of 11/2 she had increased lethargy, increased secretions, and she was not protecting her airway.  She was reintubated on 11/2, and required pressor support with norepinephrine and vasopressin after the induction agents.  Self extubated on 11/5. Re intubated on 11/6 due to hypercarbia and increased work of breathing.  We are left with the challenging options regarding the care of [name].  Due to her multiple failed extubations, we are concerned [name] will need a tracheostomy long-term to protect her airway.  However, we are not sure if she would favor a palliative approach.  Ongoing discussions with family regarding next steps.  Currently attempting to schedule a family meeting with extended family.    24 hour events:  -Family meeting happened today with palliative, decision was made to go with tracheostomy, will talk with ENT tomorrow  - remove foley & use purewick   - remove A line, cancel ABGs, check VBG q12h     Neurological   Hepatic encephalopathy  Per chart review at [facility], rapid response called on 10/24 for increased somnolence. At the time, differential included hepatic encephalopathy, toxic metabolic encephalopathy in the setting of shock (possibly sepsis), versus intracranial pathology. She was subsequently intubated. CT head on 10/24 showed no acute intracranial abnormality. Weaned off sedation with slow improvements in neurological status.  While we have continued lactulose  therapy and seeing good response with several bowel movements, her mentation remains poor.  This is concerning for severe ICU delirium.  Meeting was had with daughter, [name], on 11/5.  Described  that should intubation be necessary we would likely be moving towards a tracheostomy as [name] has continuously not been protecting her airway.  As of now [name] wanted to pursue aggressive measures including repeat intubation and potential tracheostomy.  However she stated that she will talk it over with her sister and the rest of her family.  - Continue lactulose , target 3-5 BMs daily.  MRI brain was impeded due to movement, however with we saw was unremarkable.  - Continue lactulose  and rifaximin      Analgesia: No pain issues  RASS at goal? N/A, not on sedation  [place] Agitation Assessment Scale (RASS) : -1 (04/12/2024  4:00 AM)    Pulmonary   Acute Hypoxic Hypercarbic Respiratory Failure   Rapid response called on 10/24 for increased somnolence, found to have acute hypercarbic respiratory failure and subsequently intubated. Suspect hypercarbic respiratory failure due to encephalopathy as above. CXR on 10/24 showed worsening pleural effusions. Not currently getting diuresed. Given patient's hypoxia, and AMS, there was concern for infectious etiology. MRSA nares negative. Infectious workup thus far is unremarkable. SBTs show readiness extubation based on VBGs, however there is concern that she continues to be unable to protect her airway due to her current mentation.  Reintubated on 11/2 followed by therapeutic bronchoscopy.  Self extubated on 11/5.  Reintubate noted on 11/6 due to hypercarbia and not protecting her airway.    -Plan for trach, will talk with ENT tomorrow morning  -RASS -1      Cardiovascular   Carotid Artery Injury (resolved)  At [facility], CVC placed intended for RIJ on 10/24 following intubation, PEA, ROSC. Was used with known blood return. Unclear if it was used for pressor support, but highly likely that it was. CXR for placement check showed concern for intra aterial location, and blood gas confirmed it was arterial. Thought to be in carotid artery. CVC removed with pressure held for 15 minutes, with no hematoma formation. On arrival, no physical exam and bedside ultrasound showed no large hematoma. No active bleeding.   - Vascular Surgery consulted, stated fistula has healed based on Ultrasound findings    PEA arrest s/p ROSC  Hypovolemic/Hemorrhagic shock (resolved)  Atrial fibrillation  known left atrial appendage clot  HFpEF  Patient with PEA arrest peri-intubation with ROSC. Shock thought to be hemorrhagic in the setting of GI bleed History of atrial fibrillation with known LAA clot and HFpEF. Vasopressors were weaned off 10/27. APTT was elevated the morning of 10/28 necessitating changing anticoagulation from therapeutic heparin to therapeutic Lovenox .   - Holding Coreg   - Started heparin drip for anticoagulation for LAA clot    Renal   Hypernatremia   At [facility], had persistently hypernatremia near 153. Had been treated with D5 gtt. Sodium throughout admission has largely been 146-148. Sodium was 151 morning of 10/28, presume this will decrease once we start tube feeds.  Sodium has overall normalized and continues to be within normal limits.  - Strict I/O's  - Sodium checks  - FWF with TF, adjusting as needed  -foley removed    Infectious Disease/Autoimmune   E. Coli Bacteremia (resolved),   Initially admitted for afib, found to have E. coli bacteremia on October 10. This was treated with cefazolin . Rapid response called 10/24, patient noted to be hypothermic. WBCs jumped from 3.0 to 9.7 on 10/24. Although the most recent lab was following intubation and CPR. UA with pyuria, rare bacteria, hematuria. Peri-intubation, patient developed significant hypotension requiring initiation of NE and vasopressin. Suspect  possible septic shock with unknown infectious source. Patient started on vancomycin and cefepime 10/24. CT Abdomen pelvis concerning for aspiration pneumonia. Following infectious workup was unremarkable. Antibiotics were stopped on 10/27.  Infectious workup was repeated on 11/3 due to reintubation and status post bronchoscopy.    -Lower respiratory culture grew MSSA  -on ceftriaxone for SBP, will cover for MSSA    Cultures:  Blood Culture, Routine (no units)   Date Value   04/09/2024 No Growth at 48 hours   04/09/2024 No Growth at 48 hours     Urine Culture, Comprehensive (no units)   Date Value   04/09/2024 NO GROWTH   04/04/2024 NO GROWTH     Lower Respiratory Culture (no units)   Date Value   04/10/2024 2+ Staphylococcus aureus (A)   04/10/2024 Remainder of Culture in Progress (A)     WBC (10*9/L)   Date Value   04/12/2024 2.8 (L)     WBC, UA (/HPF)   Date Value   04/10/2024 7 (H)       FEN/GI   Sigmoid Mass c/f Malignancy and Hematochezia (resolved)  Hemorrhagic Shock (resolved)  CT abdomen pelvis done 10/24 with evidence of cirrhosis and masslike thickening of the lower sigmoid colon concerning for malignancy.  CTA abdomen pelvis performed 1024 with active extravasation into the gastric fundus possibly secondary to traumatic enteric tube placement.  GI scoped the patient on 10/25 and clipped an area of active bleeding.  They removed a total of 1.5 L of blood.  Hemoglobin 10/20 6 AM downtrended to 6.0, so 2 units of blood transfused.  GI stated this was consistent with 10/25 scope and likely did not represent increased bleeding. Hb steadily improving with Hb of 9.2 on 10/28.   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 Given shock of unclear etiology. CTAP demonstrating cirrhosis. Likely that cirrhosis is contributing to hypotension.   - Continue lactulose ; target 3-5 BMs daily  - NGT in place, on TF and FWF  - Daily MELD labs and CMP     MELD 3.0: 15 at 04/05/2024  4:31 AM  MELD-Na: 13 at 04/05/2024  4:31 AM  Calculated from:  Serum Creatinine: 0.43 mg/dL (Using min of 1 mg/dL) at 88/09/7972  5:68 AM  Serum Sodium: 143 mmol/L (Using max of 137 mmol/L) at 04/05/2024  4:31 AM  Total Bilirubin: 1.3 mg/dL at 88/09/7972  5:68 AM  Serum Albumin: 2.4 g/dL at 88/09/7972  5:68 AM  INR(ratio): 1.61 at 04/03/2024 10:26 AM  Age at listing (hypothetical): 78 years  Sex: Female at 04/05/2024  4:31 AM    Provider Malnutrition Assessment:  Body mass index is 39.22 kg/m??. BMI Interpretation: >/= 30 and < 40, consistent with obesity, clinically significant requiring additional resources and complicating multiple aspects of patient care.  GLIM criteria:   Pt does not meet criteria  -I have screened this patient for malnutrition and they did NOT meet criteria for malnutrition based on GLIM criteria.  -TF and FWF; Dietician consulted, appreciate assistance    Heme/Coag   Acute blood loss anemia  Hemoglobin down trended to 6.0 on the morning of 10/26 thought to be secondary to acute GI bleed that was scoped and clipped 10/25. Patient received 2 units PRBC 10/26 and an additional unit 10/27. Hb steadily improving with Hb of  9.2 on 10/28.   - Continue to follow hemoglobin  -repeat Hemoglobin improved with 1 unit PRBCs, then downtrend again this morning to 7.9    Endocrine   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)   - Continue home anastrozole     Integumentary   NAI   #  - WOCN consulted for high risk skin assessment No. Reason: Not indicated.    Prophylaxis/LDA/Restraints/Consults   ICU Checklist completed: yes (see ICU rounding navigator in Epic)    Patient Lines/Drains/Airways Status       Active Active Lines, Drains, & Airways       Name Placement date Placement time Site Days    ETT  7.5 04/07/24  1259  -- 4    NG/OG Tube Feedings 10 Fr. Right nostril 04/01/24  1138  Right nostril  10    Urethral Catheter Temperature probe 16 Fr. 04/09/24  1030  Temperature probe  2    PICC Double Lumen 04/06/24 Left Basilic 04/06/24  1558  Basilic  5    Arterial Line 04/03/24 Right Radial 04/03/24  0830  Radial  8                  Patient Lines/Drains/Airways Status       Active Wounds       Name Placement date Placement time Site Days    Wound 03/01/24 Other (Comment) Toe (Comment which one) Left;Other (Comment) Blister present on arrival, tx already in outpatient setting, in process of healing, surrounding erythema 03/01/24  0129  Toe (Comment which one)  42    Wound 03/13/24 Irritant Contact Dermatitis Incontinence Sacrum Mid gluteal cleft MASD? 03/13/24  --  Sacrum  30                  Goals of Care     Code Status:   Orders Placed This Encounter   Procedures    Full Code     Standing Status:   Standing     Number of Occurrences:   1        Designated Healthcare Decision Maker:  Ms. Mariani designated healthcare decision maker(s) is/are   HCDM (patient stated preference): Drinkard,[name] - Spouse - [phone]    HCDM, First AlternateShari, Natt - Daughter - [phone]. See HCDM section of Epic sidebar/storyboard or ACP tab in patient chart for details regarding active HCDMs and patient capacity for decision-making.      Subjective     Patient intubated this morning on my exam. Follows commands and nods head yes and no.     Objective     Vitals - past 24 hours  Temp:  [37.4 ??C (99.3 ??F)-38 ??C (100.4 ??F)] 37.9 ??C (100.2 ??F)  Pulse:  [91-126] 108  SpO2 Pulse:  [90-128] 118  Resp:  [16-33] 20  BP: (112)/(57) 112/57  FiO2 (%):  [30 %] 30 %  SpO2:  [98 %-100 %] 98 % Intake/Output  I/O last 3 completed shifts:  In: 6753.3 [P.O.:120; I.V.:123.3; NG/GT:4160; IV Piggyback:2350]  Out: 3990 [Urine:1790; Stool:2200]     Physical Exam:    General: intubated on my examination. Arouses to voice, answers yes/no questions  HEENT: normocephalic, atraumatic   CV: pulses palpable, regular borderline tachycardia   Pulm: Rhonchi w transmitted upper airway sounds throughout  GI: soft, NTND, + BS  MSK: edema noted to her knees bilaterally, no clubbing or cyanosis  Skin: numerous large flat violaceous ecchymosis on left  arm, chest, neck bilaterally.   Neuro: Withdrawals to painful stimuli. Responding to name and reliably following commands.     Continuous Infusions:   Infusions Meds[1]    Scheduled Medications:   Scheduled Medications[2]    PRN medications:  PRN Medications[3]    Data/Imaging Review: Reviewed in Epic and personally interpreted on 04/12/2024. See EMR for detailed results.      Waddell Mead, DO  PGY1 Emergency Medicine, [facility]               [1]    heparin 11 Units/kg/hr (04/12/24 9364)    NORepinephrine bitartrate-NS Stopped (04/10/24 2130)   [2]    [START ON 04/13/2024] albumin human 25 %  1 g/kg Intravenous Once    cefTRIAXone  2 g Intravenous Q24H    cyanocobalamin (vitamin B-12)  1,000 mcg Enteral tube: gastric Daily    esomeprazole  40 mg Enteral tube: gastric Daily    flu vac 2025 [id number](PF)  0.5 mL Intramuscular During hospitalization    folic acid   1 mg Enteral tube: gastric Daily    lactulose   20 g Enteral tube: gastric TID    melatonin  3 mg Oral QPM    [Provider Hold] metoPROLOL tartrate  25 mg Enteral tube: gastric BID    multivitamins (ADULT)  1 tablet Enteral tube: gastric Daily    rifAXIMin   550 mg Enteral tube: gastric BID    sodium chloride   10 mL Intravenous Q8H    sodium chloride   10 mL Intravenous Q8H    thiamine  mononitrate (vit B1)  100 mg Enteral tube: gastric Daily   [3] acetaminophen , docusate sodium, fentaNYL  (PF) **OR** fentaNYL  (PF), haloperidol LACTATE **OR** [DISCONTINUED] haloperidol LACTATE, heparin (porcine)

## 2024-04-12 NOTE — Plan of Care (Signed)
 Shift Summary  Acetaminophen  was administered at 12:14 PM in response to increased CPOT score, with comfort restored shortly after.   Ventilator mode was changed from [facility] to PSV-CPAP at 1:28 PM, with stable FiO2 and SpO2 values maintained throughout the shift.   Cognitive status improved from drowsy and altered consciousness to alert and calm, though generalized weakness and decreased safety awareness persisted.   Overall, comfort, gas exchange, and blood pressure were maintained with minor fluctuations, and the patient remained agreeable to PT and denied pain during verbal assessments.    Optimal Comfort and Wellbeing: Comfort was generally maintained with CPOT scores of 0 throughout most of the shift, except for a brief increase to 3 at 12:14 PM, which resolved after acetaminophen  administration; pain was denied during verbal assessments later in the shift.    Effective Breathing Pattern: Breathing pattern was ventilator controlled for the entire shift, with a brief increase in respiratory rate and abnormal values noted at 3:00 PM and 4:00 PM, but returned to baseline by 5:00 PM; airway suctioning was performed twice during the shift.    Optimal Gas Exchange: SpO2 remained consistently high (94-100%) and FiO2 requirements decreased from 40% to 30% by mid-morning and remained stable; arterial blood gases showed adequate oxygenation and ventilation throughout the shift.    Optimal Cognitive Function: Cognitive function improved from drowsy and positive altered consciousness in the morning to alert and calm with negative altered consciousness by midday, though generalized weakness and decreased safety awareness persisted; patient followed one-step commands with increased time and repetition required.    Blood Pressure in Desired Range: Blood pressure and MAP fluctuated but generally remained within acceptable ranges, with some lower readings in the morning and late afternoon; no interventions for hypotension were documented.

## 2024-04-12 NOTE — Plan of Care (Signed)
 Problem: Skin Injury Risk Increased  Goal: Skin Health and Integrity  Intervention: Optimize Skin Protection  Recent Flowsheet Documentation  Taken 04/12/2024 0400 by Toniann Rosina SAILOR, RN  Head of Bed [facility]) Positioning: HOB at 30 degrees  Taken 04/12/2024 0000 by Toniann Rosina SAILOR, RN  Activity Management: in bed  Head of Bed [facility]) Positioning: HOB at 30 degrees  Taken 04/11/2024 2000 by Toniann Rosina SAILOR, RN  Pressure Reduction Techniques:   heels elevated off bed   frequent weight shift encouraged  Head of Bed (HOB) Positioning: HOB at 30-45 degrees  Pressure Reduction Devices: elbow protectors utilized  Skin Protection: adhesive use limited

## 2024-04-13 LAB — HEPATIC FUNCTION PANEL
ALBUMIN: 3.1 g/dL — ABNORMAL LOW (ref 3.4–5.0)
ALKALINE PHOSPHATASE: 144 U/L — ABNORMAL HIGH (ref 46–116)
ALT (SGPT): 14 U/L (ref 10–49)
AST (SGOT): 49 U/L — ABNORMAL HIGH (ref <=34)
BILIRUBIN DIRECT: 0.6 mg/dL — ABNORMAL HIGH (ref 0.00–0.30)
BILIRUBIN TOTAL: 1.2 mg/dL (ref 0.3–1.2)
PROTEIN TOTAL: 5.5 g/dL — ABNORMAL LOW (ref 5.7–8.2)

## 2024-04-13 LAB — HEMOGLOBIN AND HEMATOCRIT, BLOOD
HEMATOCRIT: 22.7 % — ABNORMAL LOW (ref 34.0–44.0)
HEMOGLOBIN: 7.4 g/dL — ABNORMAL LOW (ref 11.3–14.9)

## 2024-04-13 LAB — CBC W/ AUTO DIFF
BASOPHILS ABSOLUTE COUNT: 0 10*9/L (ref 0.0–0.1)
BASOPHILS RELATIVE PERCENT: 1.1 %
EOSINOPHILS ABSOLUTE COUNT: 0.2 10*9/L (ref 0.0–0.5)
EOSINOPHILS RELATIVE PERCENT: 5.3 %
HEMATOCRIT: 24.3 % — ABNORMAL LOW (ref 34.0–44.0)
HEMOGLOBIN: 7.9 g/dL — ABNORMAL LOW (ref 11.3–14.9)
LYMPHOCYTES ABSOLUTE COUNT: 0.6 10*9/L — ABNORMAL LOW (ref 1.1–3.6)
LYMPHOCYTES RELATIVE PERCENT: 21.8 %
MEAN CORPUSCULAR HEMOGLOBIN CONC: 32.7 g/dL (ref 32.0–36.0)
MEAN CORPUSCULAR HEMOGLOBIN: 32.1 pg (ref 25.9–32.4)
MEAN CORPUSCULAR VOLUME: 98.2 fL — ABNORMAL HIGH (ref 77.6–95.7)
MEAN PLATELET VOLUME: 9.2 fL (ref 6.8–10.7)
MONOCYTES ABSOLUTE COUNT: 0.3 10*9/L (ref 0.3–0.8)
MONOCYTES RELATIVE PERCENT: 10.7 %
NEUTROPHILS ABSOLUTE COUNT: 1.8 10*9/L (ref 1.8–7.8)
NEUTROPHILS RELATIVE PERCENT: 61.1 %
PLATELET COUNT: 146 10*9/L — ABNORMAL LOW (ref 150–450)
RED BLOOD CELL COUNT: 2.47 10*12/L — ABNORMAL LOW (ref 3.95–5.13)
RED CELL DISTRIBUTION WIDTH: 20.3 % — ABNORMAL HIGH (ref 12.2–15.2)
WBC ADJUSTED: 2.9 10*9/L — ABNORMAL LOW (ref 3.6–11.2)

## 2024-04-13 LAB — BLOOD GAS CRITICAL CARE PANEL, ARTERIAL
BASE EXCESS ARTERIAL: 5.2 — ABNORMAL HIGH (ref -2.0–2.0)
BASE EXCESS ARTERIAL: 3.2 — ABNORMAL HIGH (ref -2.0–2.0)
CALCIUM IONIZED ARTERIAL (MG/DL): 4.56 mg/dL (ref 4.40–5.40)
CALCIUM IONIZED ARTERIAL (MG/DL): 4.64 mg/dL (ref 4.40–5.40)
CARBOXYHEMOGLOBIN: 1.7 % — ABNORMAL HIGH (ref <1.2)
CARBOXYHEMOGLOBIN: 2.5 % — ABNORMAL HIGH (ref <1.2)
CHLORIDE, WHOLE BLOOD: 109 mmol/L — ABNORMAL HIGH (ref 98–107)
GLUCOSE WHOLE BLOOD: 135 mg/dL (ref 70–179)
GLUCOSE WHOLE BLOOD: 162 mg/dL (ref 70–179)
HCO3 ARTERIAL: 30 mmol/L — ABNORMAL HIGH (ref 22–27)
HCO3 ARTERIAL: 27 mmol/L (ref 22–27)
HEMOGLOBIN BLOOD GAS: 8.1 g/dL — ABNORMAL LOW (ref 12.00–16.00)
HEMOGLOBIN BLOOD GAS: 7.7 g/dL — ABNORMAL LOW (ref 12.00–16.00)
LACTATE BLOOD ARTERIAL: 1.1 mmol/L (ref <1.3)
LACTATE BLOOD ARTERIAL: 1.4 mmol/L — ABNORMAL HIGH (ref <1.3)
METHEMOGLOBIN: <1 % (ref <1.5)
METHEMOGLOBIN: <1 % (ref <1.5)
O2 SATURATION ARTERIAL: 99.8 % (ref 94.0–100.0)
O2 SATURATION ARTERIAL: >100 % — ABNORMAL HIGH (ref 94.0–100.0)
OXYHEMOGLOBIN: 97.8 % (ref 94.0–100.0)
OXYHEMOGLOBIN: 97.5 % (ref 94.0–100.0)
PCO2 ARTERIAL: 42.3 mmHg (ref 35.0–45.0)
PCO2 ARTERIAL: 36.8 mmHg (ref 35.0–45.0)
PH ARTERIAL: 7.45 (ref 7.35–7.45)
PH ARTERIAL: 7.47 — ABNORMAL HIGH (ref 7.35–7.45)
PO2 ARTERIAL: 144 mmHg — ABNORMAL HIGH (ref 80.0–110.0)
PO2 ARTERIAL: 128 mmHg — ABNORMAL HIGH (ref 80.0–110.0)
POTASSIUM WHOLE BLOOD: 3.6 mmol/L (ref 3.4–4.6)
POTASSIUM WHOLE BLOOD: 3.5 mmol/L (ref 3.4–4.6)
SODIUM WHOLE BLOOD: 142 mmol/L (ref 135–145)
SODIUM WHOLE BLOOD: 142 mmol/L (ref 135–145)

## 2024-04-13 LAB — BASIC METABOLIC PANEL
ANION GAP: 11 mmol/L (ref 5–14)
BLOOD UREA NITROGEN: 14 mg/dL (ref 9–23)
BUN / CREAT RATIO: 34
CALCIUM: 8.2 mg/dL — ABNORMAL LOW (ref 8.7–10.4)
CHLORIDE: 105 mmol/L (ref 98–107)
CO2: 29 mmol/L (ref 20.0–31.0)
CREATININE: 0.41 mg/dL — ABNORMAL LOW (ref 0.55–1.02)
EGFR CKD-EPI (2021) FEMALE: >90 mL/min/1.73m2 (ref >=60)
GLUCOSE RANDOM: 134 mg/dL (ref 70–179)
POTASSIUM: 3.7 mmol/L (ref 3.4–4.8)
SODIUM: 145 mmol/L (ref 135–145)

## 2024-04-13 LAB — APTT
APTT: 57.1 s — ABNORMAL HIGH (ref 24.8–38.4)
HEPARIN CORRELATION: 0.3

## 2024-04-13 LAB — BLOOD GAS, VENOUS
BASE EXCESS VENOUS: 4.4 — ABNORMAL HIGH (ref -2.0–2.0)
CARBOXYHEMOGLOBIN, VENOUS: 1.8 % — ABNORMAL HIGH (ref <1.2)
HCO3 VENOUS: 30 mmol/L — ABNORMAL HIGH (ref 22–27)
O2 SATURATION VENOUS: 64.2 % (ref 40.0–85.0)
OXYHEMOGLOBIN, VENOUS: 62.7 % (ref 40.0–85.0)
PCO2 VENOUS: 48 mmHg (ref 40–60)
PH VENOUS: 7.4 (ref 7.32–7.43)
PO2 VENOUS: 36 mmHg (ref 30–55)

## 2024-04-13 LAB — MAGNESIUM: MAGNESIUM: 2 mg/dL (ref 1.6–2.6)

## 2024-04-13 LAB — PHOSPHORUS: PHOSPHORUS: 2.2 mg/dL — ABNORMAL LOW (ref 2.4–5.1)

## 2024-04-13 LAB — SLIDE REVIEW

## 2024-04-13 LAB — CYSTATIN C
CYSTATIN C: 1.51 mg/L — ABNORMAL HIGH (ref 0.64–1.23)
EGFR CKD-EPI (2012) CYSTATIN C FEMALE: 39 mL/min/1.73m2 — ABNORMAL LOW (ref >=60)

## 2024-04-13 MED ADMIN — sodium chloride (NS) 0.9 % flush 10 mL: 10 mL | INTRAVENOUS | @ 16:00:00

## 2024-04-13 MED ADMIN — sodium chloride (NS) 0.9 % flush 10 mL: 10 mL | INTRAVENOUS | @ 09:00:00

## 2024-04-13 MED ADMIN — sodium chloride (NS) 0.9 % flush 10 mL: 10 mL | INTRAVENOUS

## 2024-04-13 MED ADMIN — rifAXIMin (XIFAXAN) oral suspension: 550 mg | GASTROENTERAL | @ 13:00:00 | Stop: 2025-04-11

## 2024-04-13 MED ADMIN — rifAXIMin (XIFAXAN) oral suspension: 550 mg | GASTROENTERAL | @ 02:00:00 | Stop: 2025-04-11

## 2024-04-13 MED ADMIN — folic acid (FOLVITE) tablet 1 mg: 1 mg | GASTROENTERAL | @ 13:00:00

## 2024-04-13 MED ADMIN — thiamine mononitrate (vit B1) tablet 100 mg: 100 mg | GASTROENTERAL | @ 13:00:00

## 2024-04-13 MED ADMIN — cyanocobalamin (vitamin B-12) tablet 1,000 mcg: 1000 ug | GASTROENTERAL | @ 13:00:00

## 2024-04-13 MED ADMIN — lactulose oral solution: 20 g | GASTROENTERAL | @ 02:00:00

## 2024-04-13 MED ADMIN — lactulose oral solution: 20 g | GASTROENTERAL | @ 13:00:00

## 2024-04-13 MED ADMIN — esomeprazole (NEXIUM) granules 40 mg: 40 mg | GASTROENTERAL | @ 13:00:00

## 2024-04-13 MED ADMIN — multivitamins, therapeutic with minerals tablet 1 tablet: 1 | GASTROENTERAL | @ 13:00:00

## 2024-04-13 MED ADMIN — fentaNYL (PF) (SUBLIMAZE) injection 25 mcg: 25 ug | INTRAVENOUS | @ 07:00:00 | Stop: 2024-04-19

## 2024-04-13 MED ADMIN — fentaNYL (PF) (SUBLIMAZE) injection 25 mcg: 25 ug | INTRAVENOUS | @ 23:00:00 | Stop: 2024-04-19

## 2024-04-13 MED ADMIN — cefTRIAXone (ROCEPHIN) 2 g in sodium chloride 0.9 % (NS) 100 mL IVPB-MBP: 2 g | INTRAVENOUS | @ 15:00:00 | Stop: 2024-04-15

## 2024-04-13 MED ADMIN — melatonin tablet 3 mg: 3 mg | ORAL | @ 22:00:00

## 2024-04-13 MED ADMIN — heparin 25,000 Units/250 mL (100 units/mL) in 0.45% saline infusion (premade): 0-24 [IU]/kg/h | INTRAVENOUS | @ 10:00:00

## 2024-04-13 MED ADMIN — sodium phosphate 30 mmol in dextrose 5 % 250 mL IVPB: 30 mmol | INTRAVENOUS | @ 12:00:00 | Stop: 2024-04-13

## 2024-04-13 MED ADMIN — albumin human 25 % 100 g: 1 g/kg | INTRAVENOUS | @ 16:00:00 | Stop: 2024-04-13

## 2024-04-13 NOTE — Advance Care Planning (Signed)
 ADVANCE CARE PLANNING NOTE    Discussion Date:  April 13, 2024    Patient has decisional capacity:  Not iso being intubated, although able to participate in decision making to some degree via nodding yes/no to questions    Patient has selected a Health Care Decision-Maker if loses capacity: Yes    Health Care Decision Maker as of 04/13/2024    HCDM (patient stated preference): [name],[name] - Spouse - [phone]    HCDM (patient stated preference): [name], [name] - Daughter - [phone]    Discussion Participants:  Patient's 2 daughters [name] and [name], patient's brother [name], [name] attending Dr. Morene [name], PC attending Dr. Mardy [name], PC fellow Dr. Darryle [name].    Conversation:  [name]'s family shared that she has had a long and difficult hospitalization, filled with many unexpected roadblocks.  They reflected on her repeated intubations and ICU stays, cardiac arrest, and numerous other complications that have arisen.  They asked for most recent medical updates, which were provided by Dr. Teddie.  He shared that Carlita has chronic conditions like cirrhosis that will never go away, and also the more acute issue such as the respiratory failure and need for repeated intubations.  He shared that right now, they have seen some signs that Athanasia is improving such that she has been more awake and alert, needing less support from the ventilator.  He shared that he is hopeful that she may continue to improve, although her path will remain long and difficult, with potential for repeated decompensations.  In light of her repeated intubations, he shared that it would not be safe for her to be intubated again (for what would be the 5th time).  As such, he made a recommendation for tracheostomy.    Aris's family shared that they think she would want to proceed with tracheostomy if there is a chance that she could one day return home and live a semi-normal life.  They note she has always said she would not want to be a vegetable I.e. dependent on machines or living the rest of her life in a hospital facility.  However, they think an extended course of recovery that involves prolonged hospitalization/rehab would likely be acceptable to her if there was hope that she could one day return home with a reasonable level of function.    Hearing this, the ICU and palliative care teams reaffirmed that choosing tracheostomy seems most in alignment with the patient's goals.  We acknowledged that even in a situation of choosing tracheostomy, if she were to not do well and decided that this quality of life was not suitable to her down the road, we could opt for more comfort based care at that time.  We also presented the alternative to tracheostomy would be extubation when the patient was most optimized she could be, with plan not to reintubate if indicated.  If she were to not do well after extubation, the plan at that point would just be to make her comfortable and allow natural death.  Patient's family reaffirmed they believe her wishes align most closely with pursuing tracheostomy.  They requested that we briefly update Alahni with what we talked about to make sure we had her input.    Back at the bedside, we provided a brief overview of her conversation to [place].  She nodded appropriately to indicate understanding.  She indicated being in agreement with her family about pursuing tracheostomy as next best step.    Treatment Decisions:   - Decision to  pursue tracheostomy        I spent 60 minutes providing voluntary advance care planning services for this patient.    [name] Dustman MD  HPM Fellow

## 2024-04-13 NOTE — Consults (Signed)
 Palliative Care Progress Note        Consultation from Requesting Attending Physician:  Morene Lang Early, MD  Primary Care Provider:  Alyse Slater Pao, MD      Assessment/Plan:      SUMMARY:  This 78 y.o. patient is seriously ill due to repeated failed extubations with prolonged need for ventilation after originally being hospitalized for scheduled TEE/DCCV (aborted d/t LAA clot) c/b AHHRF, encephalopathy and septic shock requiring MICU care, also complicated by co-morbid acute and chronic conditions including carotid injury, HFpEF, HTN, Afib on AC, and MASLD cirrhosis.    Today's visit addressed GOC meeting planning.    Symptom Assessment and Recommendations:    Per primary team.    Goals of care / ACP:  Code Status:   Code Status: Full Code   Healthcare decision-maker if lacks capacity:   HCDM (patient stated preference): Seiler,[name] - Spouse - [phone]    HCDM, First Alternate: Provencher,[name] - Daughter - [phone]     Goals of care as discussed on 11/12:  - Please see ACP note from same day for further details.  - In brief, presented family with options to proceed with tracheostomy versus extubation without a plan for reintubation if she were to fail (plan at that time would be to transition to comfort focused care and allow natural death)  - Family affirmed Kelia's ultimate goals would be to eventually get back home and lead a functional life.  They believe she would be willing to go through a prolonged hospitalization, tracheostomy and rehab if there is some hope of reaching that goal.  ICU team shared that they thought while the road ahead would be long and difficult, but this was definitely possible and that recent indicators have showed that Miriah is slowly improving.  - Patient and family ultimately decided to proceed with tracheostomy    Goals of care as discussed on 11/10:  - Hatsumi's family shares this has been a long and difficult hospitalization, with lots of unexpected decompensations  - They share that before this hospitalization, Renaye was doing well at home.  She was independent.  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  This is a MAJOR change for her.  - They would like to talk about the risks, benefits, and alternatives to tracheostomy at a meeting together with the medical team on Wednesday.  [name] is going to call her sister Sari to discuss what time they are able to meet.  - The ICU team shares that in the past, there have been questions from the family about if they would offer extubation and reintubation if indicated for a 5th time (as an alternative to proceeding straight to tracheostomy).  They would likely not offer this due to substantial risks without much benefit.    Practical, Emotional, Spiritual Support Recommendations:  Patient's daughters would like the patient to be included in decision making as much as possible    Recommendations discussed with primary team in person    Thank you for this consult. Please contact Darryle Dustman MD or page Palliative Care if there are any questions.  Palliative Care team plans to visit this patient again on 11/13.    Subjective:     Recent Events:  Family meeting today with patient's 2 daughters and brother, as well as the ICU team and palliative care team.  See ACP note for full details.  Ivette updated after meeting and provided input on plan of care.  Objective:       Function:  30% - Ambulation: Totally Bed Bound / Unable to do any work, extensive disease / Self-Care: Total care / Intake: Reduced / Level of Conscious: Full, drowsy, or confusion    Temp:  [37.3 ??C (99.1 ??F)-38 ??C (100.4 ??F)] 37.7 ??C (99.9 ??F)  Pulse:  [88-122] 111  SpO2 Pulse:  [90-121] 108  Resp:  [12-35] 20  FiO2 (%):  [30 %] 30 %  SpO2:  [97 %-100 %] 98 %    Physical Exam:  General:  Chronically ill appearing woman in NAD, alert  HEENT:  Normocephalic, ET tube in place  Eyes:  EOM intact  Cardiovascular:  Tachycardic rate, regular rhythm, systolic murmur  Pulmonary:  Rhonchorous breath sounds on ventilator  Gastrointestinal:  Soft and non-tender, +BS  Ext:  Pitting bilateral LEE  Skin:  No rashes on clothed exam  Psych: Nods yes and no appropriately to questions    Testing reviewed and interpreted:  Reviewed and interpreted test results for AKI, anemia affecting assessment of underlying illness severity and prognosis    I personally spent 35 minutes face-to-face and non-face-to-face in the care of this patient, which includes all pre, intra, and post visit time on the date of service.  All documented time was specific to the E/M visit and does not include any procedures that may have been performed.     See ACP Note from today for additional billable service: Yes.    Darryle Dustman MD  HPM Fellow

## 2024-04-13 NOTE — Plan of Care (Signed)
 Airway secure and patent, no complications throughout shift. Care ongoing  Problem: Mechanical Ventilation Invasive  Goal: Effective Communication  Outcome: Ongoing - Unchanged  Goal: Optimal Device Function  Outcome: Ongoing - Unchanged  Goal: Mechanical Ventilation Liberation  Outcome: Ongoing - Unchanged  Goal: Optimal Nutrition Delivery  Outcome: Ongoing - Unchanged  Goal: Absence of Device-Related Skin and Tissue Injury  Outcome: Ongoing - Unchanged  Goal: Absence of Ventilator-Induced Lung Injury  Outcome: Ongoing - Unchanged     Problem: Artificial Airway  Goal: Effective Communication  Outcome: Ongoing - Unchanged  Goal: Optimal Device Function  Outcome: Ongoing - Unchanged  Goal: Absence of Device-Related Skin or Tissue Injury  Outcome: Ongoing - Unchanged

## 2024-04-13 NOTE — Plan of Care (Signed)
 Pt alert and following commands. On PC 30%. Afebrile and afib 90s-120s. MAPs >65. Bath and CHG given.    Problem: Skin Injury Risk Increased  Goal: Skin Health and Integrity  Intervention: Optimize Skin Protection  Recent Flowsheet Documentation  Taken 04/13/2024 1600 by Christophe Sabra SAILOR, RN  Head of Bed [facility]) Positioning: HOB at 30-45 degrees  Taken 04/13/2024 1200 by Christophe Sabra SAILOR, RN  Head of Bed [facility]) Positioning: HOB at 30-45 degrees  Taken 04/13/2024 0800 by Christophe Sabra SAILOR, RN  Activity Management: in bed  Pressure Reduction Techniques:   frequent weight shift encouraged   heels elevated off bed   weight shift assistance provided  Head of Bed (HOB) Positioning: HOB at 30-45 degrees  Pressure Reduction Devices:   foam padding utilized   heel offloading device utilized   positioning supports utilized   pressure-redistributing mattress utilized   specialty bed utilized  Skin Protection: adhesive use limited     Problem: Adult Inpatient Plan of Care  Goal: Absence of Hospital-Acquired Illness or Injury  Intervention: Identify and Manage Fall Risk  Recent Flowsheet Documentation  Taken 04/13/2024 0800 by Christophe Sabra SAILOR, RN  Safety Interventions:   aspiration precautions   bed alarm   lighting adjusted for tasks/safety   low bed  Intervention: Prevent Skin Injury  Recent Flowsheet Documentation  Taken 04/13/2024 1800 by Christophe Sabra SAILOR, RN  Positioning for Skin: Right  Taken 04/13/2024 1600 by Christophe Sabra SAILOR, RN  Positioning for Skin: Left  Taken 04/13/2024 1400 by Christophe Sabra SAILOR, RN  Positioning for Skin: Right  Taken 04/13/2024 1200 by Christophe Sabra SAILOR, RN  Positioning for Skin: Left  Taken 04/13/2024 1000 by Christophe Sabra SAILOR, RN  Positioning for Skin: Right  Taken 04/13/2024 0800 by Christophe Sabra SAILOR, RN  Positioning for Skin: Left  Device Skin Pressure Protection: absorbent pad utilized/changed  Skin Protection: adhesive use limited  Intervention: Prevent Infection  Recent Flowsheet Documentation  Taken 04/13/2024 0800 by Christophe Sabra SAILOR, RN  Infection Prevention: cohorting utilized     Problem: Breathing Pattern Ineffective  Goal: Effective Breathing Pattern  Intervention: Promote Improved Breathing Pattern  Recent Flowsheet Documentation  Taken 04/13/2024 1600 by Christophe Sabra SAILOR, RN  Head of Bed [facility]) Positioning: HOB at 30-45 degrees  Taken 04/13/2024 1200 by Christophe Sabra SAILOR, RN  Head of Bed [facility]) Positioning: HOB at 30-45 degrees  Taken 04/13/2024 0800 by Christophe Sabra SAILOR, RN  Head of Bed [facility]) Positioning: HOB at 30-45 degrees     Problem: Fall Injury Risk  Goal: Absence of Fall and Fall-Related Injury  Intervention: Promote Injury-Free Environment  Recent Flowsheet Documentation  Taken 04/13/2024 0800 by Christophe Sabra SAILOR, RN  Safety Interventions:   aspiration precautions   bed alarm   lighting adjusted for tasks/safety   low bed     Problem: Gas Exchange Impaired  Goal: Optimal Gas Exchange  Intervention: Optimize Oxygenation and Ventilation  Recent Flowsheet Documentation  Taken 04/13/2024 1600 by Christophe Sabra SAILOR, RN  Head of Bed [facility]) Positioning: HOB at 30-45 degrees  Taken 04/13/2024 1200 by Christophe Sabra SAILOR, RN  Head of Bed [facility]) Positioning: HOB at 30-45 degrees  Taken 04/13/2024 0800 by Christophe Sabra SAILOR, RN  Head of Bed [facility]) Positioning: HOB at 30-45 degrees     Problem: Non-Violent Restraints  Intervention: Utilize least restrictive measures  Recent Flowsheet Documentation  Taken 04/13/2024 1800 by Christophe Sabra SAILOR, RN  Less Restrictive Alternative:   Repositioning   Comfort  Measures  Taken 04/13/2024 1600 by Christophe Sabra SAILOR, RN  Less Restrictive Alternative:   Repositioning   Comfort Measures  Taken 04/13/2024 1400 by Christophe Sabra SAILOR, RN  Less Restrictive Alternative:   Repositioning   Comfort Measures  Taken 04/13/2024 1200 by Christophe Sabra SAILOR, RN  Less Restrictive Alternative:   Repositioning   Comfort Measures  Taken 04/13/2024 1000 by Christophe Sabra SAILOR, RN  Less Restrictive Alternative:   Repositioning   Comfort Measures  Taken 04/13/2024 0800 by Christophe Sabra SAILOR, RN  Less Restrictive Alternative:   Repositioning   Comfort Measures  Intervention: Patient Monitoring  Recent Flowsheet Documentation  Taken 04/13/2024 1800 by Christophe Sabra SAILOR, RN  Psychological Status/Visual Check: Asleep  Circulation/Skin Integrity: No signs of injury  Range of Motion: Performed  Fluids: NPO  Food/Meal: Enteral feeding/TPN  Elimination: Urinary catheter  Taken 04/13/2024 1600 by Christophe Sabra SAILOR, RN  Psychological Status/Visual Check: Asleep  Circulation/Skin Integrity: No signs of injury  Range of Motion: Performed  Fluids: NPO  Food/Meal: Enteral feeding/TPN  Elimination: Urinary catheter  Taken 04/13/2024 1400 by Christophe Sabra SAILOR, RN  Psychological Status/Visual Check: Asleep  Circulation/Skin Integrity: No signs of injury  Range of Motion: Performed  Fluids: NPO  Food/Meal: Enteral feeding/TPN  Elimination: Urinary catheter  Taken 04/13/2024 1200 by Christophe Sabra SAILOR, RN  Psychological Status/Visual Check: Asleep  Circulation/Skin Integrity: No signs of injury  Range of Motion: Performed  Fluids: NPO  Food/Meal: Enteral feeding/TPN  Elimination: Urinary catheter  Taken 04/13/2024 1000 by Christophe Sabra SAILOR, RN  Psychological Status/Visual Check: Asleep  Circulation/Skin Integrity: No signs of injury  Range of Motion: Performed  Fluids: NPO  Food/Meal: Enteral feeding/TPN  Elimination: Urinary catheter  Taken 04/13/2024 0800 by Christophe Sabra SAILOR, RN  Psychological Status/Visual Check: Asleep  Circulation/Skin Integrity: No signs of injury  Range of Motion: Performed  Fluids: NPO  Food/Meal: Enteral feeding/TPN  Elimination: Urinary catheter     Problem: Wound  Goal: Optimal Functional Ability  Intervention: Optimize Functional Ability  Recent Flowsheet Documentation  Taken 04/13/2024 0800 by Christophe Sabra SAILOR, RN  Activity Management: in bed  Goal: Skin Health and Integrity  Intervention: Optimize Skin Protection  Recent Flowsheet Documentation  Taken 04/13/2024 1600 by Christophe Sabra SAILOR, RN  Head of Bed [facility]) Positioning: HOB at 30-45 degrees  Taken 04/13/2024 1200 by Christophe Sabra SAILOR, RN  Head of Bed [facility]) Positioning: HOB at 30-45 degrees  Taken 04/13/2024 0800 by Christophe Sabra SAILOR, RN  Activity Management: in bed  Pressure Reduction Techniques:   frequent weight shift encouraged   heels elevated off bed   weight shift assistance provided  Head of Bed (HOB) Positioning: HOB at 30-45 degrees  Pressure Reduction Devices:   foam padding utilized   heel offloading device utilized   positioning supports utilized   pressure-redistributing mattress utilized   specialty bed utilized  Skin Protection: adhesive use limited     Problem: Mechanical Ventilation Invasive  Goal: Optimal Device Function  Intervention: Optimize Device Care and Function  Recent Flowsheet Documentation  Taken 04/13/2024 1600 by Christophe Sabra SAILOR, RN  Oral Care:   mouth swabbed   oral rinse provided   suction provided   teeth brushed  Taken 04/13/2024 1200 by Christophe Sabra SAILOR, RN  Oral Care:   mouth swabbed   oral rinse provided   suction provided   tongue brushed   teeth brushed  Goal: Absence of Device-Related Skin and Tissue Injury  Intervention: Maintain Skin and  Tissue Health  Recent Flowsheet Documentation  Taken 04/13/2024 0800 by Christophe Sabra SAILOR, RN  Device Skin Pressure Protection: absorbent pad utilized/changed  Goal: Absence of Ventilator-Induced Lung Injury  Intervention: Prevent Ventilator-Associated Pneumonia  Recent Flowsheet Documentation  Taken 04/13/2024 1600 by Christophe Sabra SAILOR, RN  Head of Bed [facility]) Positioning: HOB at 30-45 degrees  Oral Care:   mouth swabbed   oral rinse provided   suction provided   teeth brushed  Taken 04/13/2024 1200 by Christophe Sabra SAILOR, RN  Head of Bed [facility]) Positioning: HOB at 30-45 degrees  Oral Care:   mouth swabbed   oral rinse provided   suction provided   tongue brushed   teeth brushed  Taken 04/13/2024 0800 by Christophe Sabra SAILOR, RN  Head of Bed [facility]) Positioning: HOB at 30-45 degrees Problem: Mechanical Ventilation Invasive  Goal: Optimal Device Function  Intervention: Optimize Device Care and Function  Recent Flowsheet Documentation  Taken 04/13/2024 1600 by Christophe Sabra SAILOR, RN  Oral Care:   mouth swabbed   oral rinse provided   suction provided   teeth brushed  Taken 04/13/2024 1200 by Christophe Sabra SAILOR, RN  Oral Care:   mouth swabbed   oral rinse provided   suction provided   tongue brushed   teeth brushed  Goal: Absence of Device-Related Skin and Tissue Injury  Intervention: Maintain Skin and Tissue Health  Recent Flowsheet Documentation  Taken 04/13/2024 0800 by Christophe Sabra SAILOR, RN  Device Skin Pressure Protection: absorbent pad utilized/changed  Goal: Absence of Ventilator-Induced Lung Injury  Intervention: Prevent Ventilator-Associated Pneumonia  Recent Flowsheet Documentation  Taken 04/13/2024 1600 by Christophe Sabra SAILOR, RN  Head of Bed [facility]) Positioning: HOB at 30-45 degrees  Oral Care:   mouth swabbed   oral rinse provided   suction provided   teeth brushed  Taken 04/13/2024 1200 by Christophe Sabra SAILOR, RN  Head of Bed [facility]) Positioning: HOB at 30-45 degrees  Oral Care:   mouth swabbed   oral rinse provided   suction provided   tongue brushed   teeth brushed  Taken 04/13/2024 0800 by Christophe Sabra SAILOR, RN  Head of Bed [facility]) Positioning: HOB at 30-45 degrees     Problem: Mechanical Ventilation Invasive  Goal: Optimal Device Function  Intervention: Optimize Device Care and Function  Recent Flowsheet Documentation  Taken 04/13/2024 1600 by Christophe Sabra SAILOR, RN  Oral Care:   mouth swabbed   oral rinse provided   suction provided   teeth brushed  Taken 04/13/2024 1200 by Christophe Sabra SAILOR, RN  Oral Care:   mouth swabbed   oral rinse provided   suction provided   tongue brushed   teeth brushed  Goal: Absence of Device-Related Skin and Tissue Injury  Intervention: Maintain Skin and Tissue Health  Recent Flowsheet Documentation  Taken 04/13/2024 0800 by Christophe Sabra SAILOR, RN  Device Skin Pressure Protection: absorbent pad utilized/changed  Goal: Absence of Ventilator-Induced Lung Injury  Intervention: Prevent Ventilator-Associated Pneumonia  Recent Flowsheet Documentation  Taken 04/13/2024 1600 by Christophe Sabra SAILOR, RN  Head of Bed [facility]) Positioning: HOB at 30-45 degrees  Oral Care:   mouth swabbed   oral rinse provided   suction provided   teeth brushed  Taken 04/13/2024 1200 by Christophe Sabra SAILOR, RN  Head of Bed [facility]) Positioning: HOB at 30-45 degrees  Oral Care:   mouth swabbed   oral rinse provided   suction provided   tongue brushed   teeth brushed  Taken 04/13/2024  0800 by Christophe Sabra SAILOR, RN  Head of Bed [facility]) Positioning: HOB at 30-45 degrees     Problem: Mechanical Ventilation Invasive  Goal: Optimal Device Function  Intervention: Optimize Device Care and Function  Recent Flowsheet Documentation  Taken 04/13/2024 1600 by Christophe Sabra SAILOR, RN  Oral Care:   mouth swabbed   oral rinse provided   suction provided   teeth brushed  Taken 04/13/2024 1200 by Christophe Sabra SAILOR, RN  Oral Care:   mouth swabbed   oral rinse provided   suction provided   tongue brushed   teeth brushed  Goal: Absence of Device-Related Skin and Tissue Injury  Intervention: Maintain Skin and Tissue Health  Recent Flowsheet Documentation  Taken 04/13/2024 0800 by Christophe Sabra SAILOR, RN  Device Skin Pressure Protection: absorbent pad utilized/changed  Goal: Absence of Ventilator-Induced Lung Injury  Intervention: Prevent Ventilator-Associated Pneumonia  Recent Flowsheet Documentation  Taken 04/13/2024 1600 by Christophe Sabra SAILOR, RN  Head of Bed [facility]) Positioning: HOB at 30-45 degrees  Oral Care:   mouth swabbed   oral rinse provided   suction provided   teeth brushed  Taken 04/13/2024 1200 by Christophe Sabra SAILOR, RN  Head of Bed [facility]) Positioning: HOB at 30-45 degrees  Oral Care:   mouth swabbed   oral rinse provided   suction provided   tongue brushed   teeth brushed  Taken 04/13/2024 0800 by Christophe Sabra SAILOR, RN  Head of Bed [facility]) Positioning: HOB at 30-45 degrees Problem: Mechanical Ventilation Invasive  Goal: Optimal Device Function  Intervention: Optimize Device Care and Function  Recent Flowsheet Documentation  Taken 04/13/2024 1600 by Christophe Sabra SAILOR, RN  Oral Care:   mouth swabbed   oral rinse provided   suction provided   teeth brushed  Taken 04/13/2024 1200 by Christophe Sabra SAILOR, RN  Oral Care:   mouth swabbed   oral rinse provided   suction provided   tongue brushed   teeth brushed  Goal: Absence of Device-Related Skin and Tissue Injury  Intervention: Maintain Skin and Tissue Health  Recent Flowsheet Documentation  Taken 04/13/2024 0800 by Christophe Sabra SAILOR, RN  Device Skin Pressure Protection: absorbent pad utilized/changed  Goal: Absence of Ventilator-Induced Lung Injury  Intervention: Prevent Ventilator-Associated Pneumonia  Recent Flowsheet Documentation  Taken 04/13/2024 1600 by Christophe Sabra SAILOR, RN  Head of Bed [facility]) Positioning: HOB at 30-45 degrees  Oral Care:   mouth swabbed   oral rinse provided   suction provided   teeth brushed  Taken 04/13/2024 1200 by Christophe Sabra SAILOR, RN  Head of Bed [facility]) Positioning: HOB at 30-45 degrees  Oral Care:   mouth swabbed   oral rinse provided   suction provided   tongue brushed   teeth brushed  Taken 04/13/2024 0800 by Christophe Sabra SAILOR, RN  Head of Bed [facility]) Positioning: HOB at 30-45 degrees     Problem: Mechanical Ventilation Invasive  Goal: Optimal Device Function  Intervention: Optimize Device Care and Function  Recent Flowsheet Documentation  Taken 04/13/2024 1600 by Christophe Sabra SAILOR, RN  Oral Care:   mouth swabbed   oral rinse provided   suction provided   teeth brushed  Taken 04/13/2024 1200 by Christophe Sabra SAILOR, RN  Oral Care:   mouth swabbed   oral rinse provided   suction provided   tongue brushed   teeth brushed  Goal: Absence of Device-Related Skin and Tissue Injury  Intervention: Maintain Skin and Tissue Health  Recent Flowsheet Documentation  Taken  04/13/2024 0800 by Christophe Sabra SAILOR, RN  Device Skin Pressure Protection: absorbent pad utilized/changed  Goal: Absence of Ventilator-Induced Lung Injury  Intervention: Prevent Ventilator-Associated Pneumonia  Recent Flowsheet Documentation  Taken 04/13/2024 1600 by Christophe Sabra SAILOR, RN  Head of Bed [facility]) Positioning: HOB at 30-45 degrees  Oral Care:   mouth swabbed   oral rinse provided   suction provided   teeth brushed  Taken 04/13/2024 1200 by Christophe Sabra SAILOR, RN  Head of Bed [facility]) Positioning: HOB at 30-45 degrees  Oral Care:   mouth swabbed   oral rinse provided   suction provided   tongue brushed   teeth brushed  Taken 04/13/2024 0800 by Christophe Sabra SAILOR, RN  Head of Bed [facility]) Positioning: HOB at 30-45 degrees     Problem: Mechanical Ventilation Invasive  Goal: Optimal Device Function  Intervention: Optimize Device Care and Function  Recent Flowsheet Documentation  Taken 04/13/2024 1600 by Christophe Sabra SAILOR, RN  Oral Care:   mouth swabbed   oral rinse provided   suction provided   teeth brushed  Taken 04/13/2024 1200 by Christophe Sabra SAILOR, RN  Oral Care:   mouth swabbed   oral rinse provided   suction provided   tongue brushed   teeth brushed  Goal: Absence of Device-Related Skin and Tissue Injury  Intervention: Maintain Skin and Tissue Health  Recent Flowsheet Documentation  Taken 04/13/2024 0800 by Christophe Sabra SAILOR, RN  Device Skin Pressure Protection: absorbent pad utilized/changed  Goal: Absence of Ventilator-Induced Lung Injury  Intervention: Prevent Ventilator-Associated Pneumonia  Recent Flowsheet Documentation  Taken 04/13/2024 1600 by Christophe Sabra SAILOR, RN  Head of Bed [facility]) Positioning: HOB at 30-45 degrees  Oral Care:   mouth swabbed   oral rinse provided   suction provided   teeth brushed  Taken 04/13/2024 1200 by Christophe Sabra SAILOR, RN  Head of Bed [facility]) Positioning: HOB at 30-45 degrees  Oral Care:   mouth swabbed   oral rinse provided   suction provided   tongue brushed   teeth brushed  Taken 04/13/2024 0800 by Christophe Sabra SAILOR, RN  Head of Bed [facility]) Positioning: HOB at 30-45 degrees Problem: Artificial Airway  Goal: Optimal Device Function  Intervention: Optimize Device Care and Function  Recent Flowsheet Documentation  Taken 04/13/2024 1600 by Christophe Sabra SAILOR, RN  Oral Care:   mouth swabbed   oral rinse provided   suction provided   teeth brushed  Taken 04/13/2024 1200 by Christophe Sabra SAILOR, RN  Oral Care:   mouth swabbed   oral rinse provided   suction provided   tongue brushed   teeth brushed  Taken 04/13/2024 0800 by Christophe Sabra SAILOR, RN  Aspiration Precautions: awake/alert before oral intake  Goal: Absence of Device-Related Skin or Tissue Injury  Intervention: Maintain Skin and Tissue Health  Recent Flowsheet Documentation  Taken 04/13/2024 0800 by Christophe Sabra SAILOR, RN  Device Skin Pressure Protection: absorbent pad utilized/changed

## 2024-04-14 LAB — BLOOD GAS, VENOUS
BASE EXCESS VENOUS: 5.2 — ABNORMAL HIGH (ref -2.0–2.0)
BASE EXCESS VENOUS: 4.3 — ABNORMAL HIGH (ref -2.0–2.0)
CARBOXYHEMOGLOBIN, VENOUS: 1.9 % — ABNORMAL HIGH (ref <1.2)
CARBOXYHEMOGLOBIN, VENOUS: 1.8 % — ABNORMAL HIGH (ref <1.2)
HCO3 VENOUS: 30 mmol/L — ABNORMAL HIGH (ref 22–27)
HCO3 VENOUS: 29 mmol/L — ABNORMAL HIGH (ref 22–27)
METHEMOGLOBIN, VENOUS: <1 % (ref <1.5)
METHEMOGLOBIN, VENOUS: <1 % (ref <1.5)
O2 SATURATION VENOUS: 71.5 % (ref 40.0–85.0)
O2 SATURATION VENOUS: 67.6 % (ref 40.0–85.0)
OXYHEMOGLOBIN, VENOUS: 70 % (ref 40.0–85.0)
OXYHEMOGLOBIN, VENOUS: 66.1 % (ref 40.0–85.0)
PCO2 VENOUS: 46 mmHg (ref 40–60)
PCO2 VENOUS: 49 mmHg (ref 40–60)
PH VENOUS: 7.43 (ref 7.32–7.43)
PH VENOUS: 7.39 (ref 7.32–7.43)
PO2 VENOUS: 41 mmHg (ref 30–55)
PO2 VENOUS: 43 mmHg (ref 30–55)

## 2024-04-14 LAB — BASIC METABOLIC PANEL
ANION GAP: 12 mmol/L (ref 5–14)
BLOOD UREA NITROGEN: 11 mg/dL (ref 9–23)
BUN / CREAT RATIO: 26
CALCIUM: 8.7 mg/dL (ref 8.7–10.4)
CHLORIDE: 106 mmol/L (ref 98–107)
CO2: 29 mmol/L (ref 20.0–31.0)
CREATININE: 0.42 mg/dL — ABNORMAL LOW (ref 0.55–1.02)
EGFR CKD-EPI (2021) FEMALE: >90 mL/min/1.73m2 (ref >=60)
GLUCOSE RANDOM: 144 mg/dL (ref 70–179)
POTASSIUM: 3.1 mmol/L — ABNORMAL LOW (ref 3.4–4.8)
SODIUM: 147 mmol/L — ABNORMAL HIGH (ref 135–145)

## 2024-04-14 LAB — HEPATIC FUNCTION PANEL
ALBUMIN: 4 g/dL (ref 3.4–5.0)
ALKALINE PHOSPHATASE: 155 U/L — ABNORMAL HIGH (ref 46–116)
ALT (SGPT): 11 U/L (ref 10–49)
AST (SGOT): 26 U/L (ref <=34)
BILIRUBIN DIRECT: 0.7 mg/dL — ABNORMAL HIGH (ref 0.00–0.30)
BILIRUBIN TOTAL: 1.2 mg/dL (ref 0.3–1.2)
PROTEIN TOTAL: 6.3 g/dL (ref 5.7–8.2)

## 2024-04-14 LAB — APTT
APTT: >400 s (ref 24.8–38.4)
APTT: 52.9 s — ABNORMAL HIGH (ref 24.8–38.4)
HEPARIN CORRELATION: >2.3
HEPARIN CORRELATION: 0.3

## 2024-04-14 LAB — CBC W/ AUTO DIFF
BASOPHILS ABSOLUTE COUNT: 0 10*9/L (ref 0.0–0.1)
BASOPHILS RELATIVE PERCENT: 0.6 %
EOSINOPHILS ABSOLUTE COUNT: 0.1 10*9/L (ref 0.0–0.5)
EOSINOPHILS RELATIVE PERCENT: 3.5 %
HEMATOCRIT: 25.6 % — ABNORMAL LOW (ref 34.0–44.0)
HEMOGLOBIN: 8.5 g/dL — ABNORMAL LOW (ref 11.3–14.9)
LYMPHOCYTES ABSOLUTE COUNT: 0.8 10*9/L — ABNORMAL LOW (ref 1.1–3.6)
LYMPHOCYTES RELATIVE PERCENT: 19.9 %
MEAN CORPUSCULAR HEMOGLOBIN CONC: 33.2 g/dL (ref 32.0–36.0)
MEAN CORPUSCULAR HEMOGLOBIN: 32.6 pg — ABNORMAL HIGH (ref 25.9–32.4)
MEAN CORPUSCULAR VOLUME: 98.1 fL — ABNORMAL HIGH (ref 77.6–95.7)
MEAN PLATELET VOLUME: 8.8 fL (ref 6.8–10.7)
MONOCYTES ABSOLUTE COUNT: 0.5 10*9/L (ref 0.3–0.8)
MONOCYTES RELATIVE PERCENT: 12.4 %
NEUTROPHILS ABSOLUTE COUNT: 2.6 10*9/L (ref 1.8–7.8)
NEUTROPHILS RELATIVE PERCENT: 63.6 %
PLATELET COUNT: 131 10*9/L — ABNORMAL LOW (ref 150–450)
RED BLOOD CELL COUNT: 2.61 10*12/L — ABNORMAL LOW (ref 3.95–5.13)
RED CELL DISTRIBUTION WIDTH: 20.4 % — ABNORMAL HIGH (ref 12.2–15.2)
WBC ADJUSTED: 4.1 10*9/L (ref 3.6–11.2)

## 2024-04-14 LAB — MAGNESIUM: MAGNESIUM: 2.1 mg/dL (ref 1.6–2.6)

## 2024-04-14 LAB — PHOSPHORUS: PHOSPHORUS: 2.3 mg/dL — ABNORMAL LOW (ref 2.4–5.1)

## 2024-04-14 LAB — CYSTATIN C
CYSTATIN C: 1.46 mg/L — ABNORMAL HIGH (ref 0.64–1.23)
EGFR CKD-EPI (2012) CYSTATIN C FEMALE: 41 mL/min/1.73m2 — ABNORMAL LOW (ref >=60)

## 2024-04-14 MED ADMIN — sodium chloride (NS) 0.9 % flush 10 mL: 10 mL | INTRAVENOUS | @ 23:00:00

## 2024-04-14 MED ADMIN — sodium chloride (NS) 0.9 % flush 10 mL: 10 mL | INTRAVENOUS | @ 15:00:00

## 2024-04-14 MED ADMIN — rifAXIMin (XIFAXAN) oral suspension: 550 mg | GASTROENTERAL | @ 02:00:00 | Stop: 2025-04-11

## 2024-04-14 MED ADMIN — rifAXIMin (XIFAXAN) oral suspension: 550 mg | GASTROENTERAL | @ 13:00:00 | Stop: 2025-04-11

## 2024-04-14 MED ADMIN — folic acid (FOLVITE) tablet 1 mg: 1 mg | GASTROENTERAL | @ 13:00:00

## 2024-04-14 MED ADMIN — metoPROLOL tartrate (Lopressor) tablet 25 mg: 25 mg | GASTROENTERAL | @ 16:00:00

## 2024-04-14 MED ADMIN — thiamine mononitrate (vit B1) tablet 100 mg: 100 mg | GASTROENTERAL | @ 13:00:00

## 2024-04-14 MED ADMIN — cyanocobalamin (vitamin B-12) tablet 1,000 mcg: 1000 ug | GASTROENTERAL | @ 13:00:00

## 2024-04-14 MED ADMIN — lactulose oral solution: 20 g | GASTROENTERAL | @ 18:00:00

## 2024-04-14 MED ADMIN — lactulose oral solution: 20 g | GASTROENTERAL | @ 13:00:00

## 2024-04-14 MED ADMIN — lactulose oral solution: 20 g | GASTROENTERAL | @ 02:00:00

## 2024-04-14 MED ADMIN — esomeprazole (NEXIUM) granules 40 mg: 40 mg | GASTROENTERAL | @ 13:00:00

## 2024-04-14 MED ADMIN — multivitamins, therapeutic with minerals tablet 1 tablet: 1 | GASTROENTERAL | @ 13:00:00

## 2024-04-14 MED ADMIN — cefTRIAXone (ROCEPHIN) 2 g in sodium chloride 0.9 % (NS) 100 mL IVPB-MBP: 2 g | INTRAVENOUS | @ 15:00:00 | Stop: 2024-04-14

## 2024-04-14 MED ADMIN — melatonin tablet 3 mg: 3 mg | ORAL | @ 22:00:00

## 2024-04-14 MED ADMIN — potassium chloride (KLOR-CON) packet 40 mEq: 40 meq | GASTROENTERAL | @ 16:00:00 | Stop: 2024-04-14

## 2024-04-14 MED ADMIN — potassium chloride (KLOR-CON) packet 40 mEq: 40 meq | GASTROENTERAL | @ 11:00:00 | Stop: 2024-04-14

## 2024-04-14 MED ADMIN — heparin 25,000 Units/250 mL (100 units/mL) in 0.45% saline infusion (premade): 0-24 [IU]/kg/h | INTRAVENOUS | @ 12:00:00

## 2024-04-14 MED ADMIN — potassium phosphate 15 mmol in sodium chloride (NS) 0.9 % 250 mL infusion: 15 mmol | INTRAVENOUS | @ 16:00:00 | Stop: 2024-04-14

## 2024-04-14 NOTE — Consults (Signed)
 Palliative Care Progress Note        Consultation from Requesting Attending Physician:  Morene Lang Early, MD  Primary Care Provider:  Alyse Slater Pao, MD      Assessment/Plan:      SUMMARY:  This 78 y.o. patient is seriously ill due to repeated failed extubations with prolonged need for ventilation after originally being hospitalized for scheduled TEE/DCCV (aborted d/t LAA clot) c/b AHHRF, encephalopathy and septic shock requiring MICU care, also complicated by co-morbid acute and chronic conditions including carotid injury, HFpEF, HTN, Afib on AC, and MASLD cirrhosis.    Today's visit addressed GOC/ follow up.    Symptom Assessment and Recommendations:    Per primary team.    Goals of care / ACP:  Code Status:   Code Status: Full Code   Healthcare decision-maker if lacks capacity:   HCDM (patient stated preference): Minehart,[name]    HCDM (patient stated preference): Clock,[name] - Daughter - [phone]     Goals of care as discussed on 11/12:  - Please see ACP note from same day for further details.  - In brief, presented family with options to proceed with tracheostomy versus extubation without a plan for reintubation if she were to fail (plan at that time would be to transition to comfort focused care and allow natural death)  - Family affirmed Bevely's ultimate goals would be to eventually get back home and lead a functional life.  They believe she would be willing to go through a prolonged hospitalization, tracheostomy and rehab if there is some hope of reaching that goal.  ICU team shared that they thought while the road ahead would be long and difficult, but this was definitely possible and that recent indicators have showed that Dollye is slowly improving.  - Patient and family ultimately decided to proceed with tracheostomy, timing TBD    Goals of care as discussed on 11/10:  - Rashonda's family shares this has been a long and difficult hospitalization, with lots of unexpected decompensations  - They share that before this hospitalization, Jaymie was doing well at home.  She was independent.  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  This is a MAJOR change for her.  - They would like to talk about the risks, benefits, and alternatives to tracheostomy at a meeting together with the medical team on Wednesday.  [name] is going to call her sister Sari to discuss what time they are able to meet.  - The ICU team shares that in the past, there have been questions from the family about if they would offer extubation and reintubation if indicated for a 5th time (as an alternative to proceeding straight to tracheostomy).  They would likely not offer this due to substantial risks without much benefit.    Practical, Emotional, Spiritual Support Recommendations:  Patient's daughters would like the patient to be included in decision making as much as possible    Recommendations discussed with primary team in person    Thank you for this consult. Please contact Darryle Dustman MD or page Palliative Care if there are any questions.  Palliative Care team will sign off at this time.  Please feel free to re-consult if new questions arise.    Subjective:     Recent Events:  - ENT consulted, timing of trach TBD  - Aliciana still awake, nodding yes/no to communicate  - Talked to daughter [name] by phone, she has no further questions after  yesterday's meeting just waiting to hear when trach will be      Objective:       Function:  30% - Ambulation: Totally Bed Bound / Unable to do any work, extensive disease / Self-Care: Total care / Intake: Reduced / Level of Conscious: Full, drowsy, or confusion    Temp:  [37.2 ??C (98.9 ??F)-37.9 ??C (100.3 ??F)] 37.2 ??C (98.9 ??F)  Pulse:  [85-126] 98  SpO2 Pulse:  [83-126] 98  Resp:  [14-36] 21  BP: (137-149)/(72-89) 149/89  FiO2 (%):  [30 %] 30 %  SpO2:  [98 %-100 %] 99 %    Physical Exam:  General:  Chronically ill appearing woman in NAD, alert  HEENT: Normocephalic, ET tube in place  Eyes:  EOM intact  Cardiovascular:  Tachycardic rate, regular rhythm, systolic murmur  Pulmonary:  Rhonchorous breath sounds on ventilator  Gastrointestinal:  Soft and non-tender, +BS  Ext:  Pitting bilateral LEE  Skin:  No rashes on clothed exam  Psych: Awake, nods yes/no to questions appropriately    Testing reviewed and interpreted:  Reviewed and interpreted test results for AKI, anemia affecting assessment of underlying illness severity and prognosis    I personally spent 30 minutes face-to-face and non-face-to-face in the care of this patient, which includes all pre, intra, and post visit time on the date of service.  All documented time was specific to the E/M visit and does not include any procedures that may have been performed.     See ACP Note from today for additional billable service: No.    Darryle Dustman MD  HPM Fellow

## 2024-04-14 NOTE — Plan of Care (Signed)
 Pt alert, follows commands. No sedation or PRNs given. Denies pain. Continues on hep gtt. TF at goal. Purewick in place, voiding. Daugher updated by phone.       Problem: Skin Injury Risk Increased  Goal: Skin Health and Integrity  Intervention: Optimize Skin Protection  Recent Flowsheet Documentation  Taken 04/14/2024 0400 by Corinthia Sharper, RN  Head of Bed [facility]) Positioning: HOB at 30 degrees  Taken 04/14/2024 0200 by Corinthia Sharper, RN  Head of Bed [facility]) Positioning: HOB at 30 degrees  Taken 04/14/2024 0000 by Corinthia Sharper, RN  Head of Bed [facility]) Positioning: HOB at 30 degrees  Taken 04/13/2024 2200 by Corinthia Sharper, RN  Head of Bed [facility]) Positioning: HOB at 30 degrees  Taken 04/13/2024 2000 by Corinthia Sharper, RN  Pressure Reduction Techniques:   heels elevated off bed   weight shift assistance provided  Head of Bed (HOB) Positioning: HOB at 30 degrees  Pressure Reduction Devices:   heel offloading device utilized   positioning supports utilized   pressure-redistributing mattress utilized  Skin Protection:   adhesive use limited   incontinence pads utilized   tubing/devices free from skin contact     Problem: Adult Inpatient Plan of Care  Goal: Absence of Hospital-Acquired Illness or Injury  Intervention: Identify and Manage Fall Risk  Recent Flowsheet Documentation  Taken 04/13/2024 2000 by Corinthia Sharper, RN  Safety Interventions:   aspiration precautions   fall reduction program maintained   lighting adjusted for tasks/safety  Intervention: Prevent Skin Injury  Recent Flowsheet Documentation  Taken 04/14/2024 0400 by Corinthia Sharper, RN  Positioning for Skin: Left  Taken 04/14/2024 0200 by Corinthia Sharper, RN  Positioning for Skin: Right  Taken 04/14/2024 0000 by Corinthia Sharper, RN  Positioning for Skin: Left  Taken 04/13/2024 2200 by Corinthia Sharper, RN  Positioning for Skin: Right  Taken 04/13/2024 2000 by Corinthia Sharper, RN  Positioning for Skin: Left  Device Skin Pressure Protection: adhesive use limited   tubing/devices free from skin contact  Skin Protection:   adhesive use limited   incontinence pads utilized   tubing/devices free from skin contact  Intervention: Prevent Infection  Recent Flowsheet Documentation  Taken 04/13/2024 2000 by Corinthia Sharper, RN  Infection Prevention:   hand hygiene promoted   single patient room provided     Problem: Breathing Pattern Ineffective  Goal: Effective Breathing Pattern  Intervention: Promote Improved Breathing Pattern  Recent Flowsheet Documentation  Taken 04/14/2024 0400 by Corinthia Sharper, RN  Head of Bed [facility]) Positioning: HOB at 30 degrees  Taken 04/14/2024 0200 by Corinthia Sharper, RN  Head of Bed [facility]) Positioning: HOB at 30 degrees  Taken 04/14/2024 0000 by Corinthia Sharper, RN  Head of Bed [facility]) Positioning: HOB at 30 degrees  Taken 04/13/2024 2200 by Corinthia Sharper, RN  Head of Bed [facility]) Positioning: HOB at 30 degrees  Taken 04/13/2024 2000 by Corinthia Sharper, RN  Head of Bed [facility]) Positioning: HOB at 30 degrees     Problem: Fall Injury Risk  Goal: Absence of Fall and Fall-Related Injury  Intervention: Promote Injury-Free Environment  Recent Flowsheet Documentation  Taken 04/13/2024 2000 by Corinthia Sharper, RN  Safety Interventions:   aspiration precautions   fall reduction program maintained   lighting adjusted for tasks/safety     Problem: Gas Exchange Impaired  Goal: Optimal Gas Exchange  Intervention: Optimize Oxygenation and Ventilation  Recent Flowsheet Documentation  Taken 04/14/2024 0400 by Corinthia Sharper, RN  Head of Bed [facility]) Positioning: HOB at 30 degrees  Taken 04/14/2024 0200 by Corinthia Sharper, RN  Head of Bed [facility]) Positioning: HOB at 30 degrees  Taken 04/14/2024 0000 by Corinthia Sharper, RN  Head of Bed [facility]) Positioning: HOB at 30 degrees  Taken 04/13/2024 2200 by Corinthia Sharper, RN  Head of Bed [facility]) Positioning: HOB at 30 degrees  Taken 04/13/2024 2000 by Corinthia Sharper, RN  Head of Bed [facility]) Positioning: HOB at 30 degrees     Problem: Non-Violent Restraints  Intervention: Utilize least restrictive measures  Recent Flowsheet Documentation  Taken 04/14/2024 0400 by Corinthia Sharper, RN  Less Restrictive Alternative: 1:1 patient care  Taken 04/14/2024 0200 by Corinthia Sharper, RN  Less Restrictive Alternative: 1:1 patient care  Taken 04/14/2024 0000 by Corinthia Sharper, RN  Less Restrictive Alternative: 1:1 patient care  Taken 04/13/2024 2200 by Corinthia Sharper, RN  Less Restrictive Alternative: Repositioning  Taken 04/13/2024 2000 by Corinthia Sharper, RN  Less Restrictive Alternative: Repositioning  Intervention: Patient Monitoring  Recent Flowsheet Documentation  Taken 04/14/2024 0400 by Corinthia Sharper, RN  Psychological Status/Visual Check: Subdued  Circulation/Skin Integrity: No signs of injury  Range of Motion: Performed  Fluids: NPO  Food/Meal: Enteral feeding/TPN  Elimination: (purewick) Offered  Taken 04/14/2024 0200 by Corinthia Sharper, RN  Psychological Status/Visual Check: Subdued  Circulation/Skin Integrity: No signs of injury  Range of Motion: Performed  Fluids: NPO  Food/Meal: Enteral feeding/TPN  Elimination: (purewick) Offered  Taken 04/14/2024 0000 by Corinthia Sharper, RN  Psychological Status/Visual Check: Subdued  Circulation/Skin Integrity: No signs of injury  Range of Motion: Performed  Fluids: NPO  Food/Meal: Enteral feeding/TPN  Elimination: (purewick) Offered  Taken 04/13/2024 2200 by Corinthia Sharper, RN  Psychological Status/Visual Check: Subdued  Circulation/Skin Integrity: No signs of injury  Range of Motion: Performed  Fluids: NPO  Food/Meal: Enteral feeding/TPN  Elimination: (purewick) Offered  Taken 04/13/2024 2000 by Corinthia Sharper, RN  Psychological Status/Visual Check: Subdued  Circulation/Skin Integrity: No signs of injury  Range of Motion: Performed  Fluids: NPO  Food/Meal: Enteral feeding/TPN  Elimination: (purewick) Offered     Problem: Wound  Goal: Absence of Infection Signs and Symptoms  Intervention: Prevent or Manage Infection  Recent Flowsheet Documentation  Taken 04/13/2024 2000 by Corinthia Sharper, RN  Infection Management: aseptic technique maintained  Goal: Skin Health and Integrity  Intervention: Optimize Skin Protection  Recent Flowsheet Documentation  Taken 04/14/2024 0400 by Corinthia Sharper, RN  Head of Bed [facility]) Positioning: HOB at 30 degrees  Taken 04/14/2024 0200 by Corinthia Sharper, RN  Head of Bed [facility]) Positioning: HOB at 30 degrees  Taken 04/14/2024 0000 by Corinthia Sharper, RN  Head of Bed [facility]) Positioning: HOB at 30 degrees  Taken 04/13/2024 2200 by Corinthia Sharper, RN  Head of Bed [facility]) Positioning: HOB at 30 degrees  Taken 04/13/2024 2000 by Corinthia Sharper, RN  Pressure Reduction Techniques:   heels elevated off bed   weight shift assistance provided  Head of Bed (HOB) Positioning: HOB at 30 degrees  Pressure Reduction Devices:   heel offloading device utilized   positioning supports utilized   pressure-redistributing mattress utilized  Skin Protection:   adhesive use limited   incontinence pads utilized   tubing/devices free from skin contact     Problem: Mechanical Ventilation Invasive  Goal: Optimal Device Function  Intervention: Optimize Device Care and Function  Recent Flowsheet Documentation  Taken 04/14/2024 0400 by Corinthia Sharper, RN  Oral Care:   mouth swabbed   suction provided  Taken 04/14/2024 0000 by Corinthia Sharper, RN  Oral Care:  mouth swabbed   suction provided  Goal: Absence of Device-Related Skin and Tissue Injury  Intervention: Maintain Skin and Tissue Health  Recent Flowsheet Documentation  Taken 04/13/2024 2000 by Corinthia Sharper, RN  Device Skin Pressure Protection:   adhesive use limited   tubing/devices free from skin contact  Goal: Absence of Ventilator-Induced Lung Injury  Intervention: Prevent Ventilator-Associated Pneumonia  Recent Flowsheet Documentation  Taken 04/14/2024 0400 by Corinthia Sharper, RN  Head of Bed [facility]) Positioning: HOB at 30 degrees  Oral Care:   mouth swabbed   suction provided  Taken 04/14/2024 0200 by Corinthia Sharper, RN  Head of Bed [facility]) Positioning: HOB at 30 degrees  Taken 04/14/2024 0000 by Corinthia Sharper, RN  Head of Bed [facility]) Positioning: HOB at 30 degrees  Oral Care:   mouth swabbed   suction provided  Taken 04/13/2024 2200 by Corinthia Sharper, RN  Head of Bed [facility]) Positioning: HOB at 30 degrees  Taken 04/13/2024 2000 by Corinthia Sharper, RN  Head of Bed [facility]) Positioning: HOB at 30 degrees     Problem: Mechanical Ventilation Invasive  Goal: Optimal Device Function  Intervention: Optimize Device Care and Function  Recent Flowsheet Documentation  Taken 04/14/2024 0400 by Corinthia Sharper, RN  Oral Care:   mouth swabbed   suction provided  Taken 04/14/2024 0000 by Corinthia Sharper, RN  Oral Care:   mouth swabbed   suction provided  Goal: Absence of Device-Related Skin and Tissue Injury  Intervention: Maintain Skin and Tissue Health  Recent Flowsheet Documentation  Taken 04/13/2024 2000 by Corinthia Sharper, RN  Device Skin Pressure Protection:   adhesive use limited   tubing/devices free from skin contact  Goal: Absence of Ventilator-Induced Lung Injury  Intervention: Prevent Ventilator-Associated Pneumonia  Recent Flowsheet Documentation  Taken 04/14/2024 0400 by Corinthia Sharper, RN  Head of Bed [facility]) Positioning: HOB at 30 degrees  Oral Care:   mouth swabbed   suction provided  Taken 04/14/2024 0200 by Corinthia Sharper, RN  Head of Bed [facility]) Positioning: HOB at 30 degrees  Taken 04/14/2024 0000 by Corinthia Sharper, RN  Head of Bed [facility]) Positioning: HOB at 30 degrees  Oral Care:   mouth swabbed   suction provided  Taken 04/13/2024 2200 by Corinthia Sharper, RN  Head of Bed [facility]) Positioning: HOB at 30 degrees  Taken 04/13/2024 2000 by Corinthia Sharper, RN  Head of Bed [facility]) Positioning: HOB at 30 degrees     Problem: Mechanical Ventilation Invasive  Goal: Optimal Device Function  Intervention: Optimize Device Care and Function  Recent Flowsheet Documentation  Taken 04/14/2024 0400 by Corinthia Sharper, RN  Oral Care:   mouth swabbed   suction provided  Taken 04/14/2024 0000 by Corinthia Sharper, RN  Oral Care:   mouth swabbed   suction provided  Goal: Absence of Device-Related Skin and Tissue Injury  Intervention: Maintain Skin and Tissue Health  Recent Flowsheet Documentation  Taken 04/13/2024 2000 by Corinthia Sharper, RN  Device Skin Pressure Protection:   adhesive use limited   tubing/devices free from skin contact  Goal: Absence of Ventilator-Induced Lung Injury  Intervention: Prevent Ventilator-Associated Pneumonia  Recent Flowsheet Documentation  Taken 04/14/2024 0400 by Corinthia Sharper, RN  Head of Bed [facility]) Positioning: HOB at 30 degrees  Oral Care:   mouth swabbed   suction provided  Taken 04/14/2024 0200 by Corinthia Sharper, RN  Head of Bed [facility]) Positioning: HOB at 30 degrees  Taken 04/14/2024 0000 by Corinthia Sharper, RN  Head of Bed [facility]) Positioning: HOB at  30 degrees  Oral Care:   mouth swabbed   suction provided  Taken 04/13/2024 2200 by Corinthia Sharper, RN  Head of Bed [facility]) Positioning: HOB at 30 degrees  Taken 04/13/2024 2000 by Corinthia Sharper, RN  Head of Bed [facility]) Positioning: HOB at 30 degrees     Problem: Mechanical Ventilation Invasive  Goal: Optimal Device Function  Intervention: Optimize Device Care and Function  Recent Flowsheet Documentation  Taken 04/14/2024 0400 by Corinthia Sharper, RN  Oral Care:   mouth swabbed   suction provided  Taken 04/14/2024 0000 by Corinthia Sharper, RN  Oral Care:   mouth swabbed   suction provided  Goal: Absence of Device-Related Skin and Tissue Injury  Intervention: Maintain Skin and Tissue Health  Recent Flowsheet Documentation  Taken 04/13/2024 2000 by Corinthia Sharper, RN  Device Skin Pressure Protection:   adhesive use limited   tubing/devices free from skin contact  Goal: Absence of Ventilator-Induced Lung Injury  Intervention: Prevent Ventilator-Associated Pneumonia  Recent Flowsheet Documentation  Taken 04/14/2024 0400 by Corinthia Sharper, RN  Head of Bed [facility]) Positioning: HOB at 30 degrees  Oral Care:   mouth swabbed   suction provided  Taken 04/14/2024 0200 by Corinthia Sharper, RN  Head of Bed [facility]) Positioning: HOB at 30 degrees  Taken 04/14/2024 0000 by Corinthia Sharper, RN  Head of Bed [facility]) Positioning: HOB at 30 degrees  Oral Care:   mouth swabbed   suction provided  Taken 04/13/2024 2200 by Corinthia Sharper, RN  Head of Bed [facility]) Positioning: HOB at 30 degrees  Taken 04/13/2024 2000 by Corinthia Sharper, RN  Head of Bed [facility]) Positioning: HOB at 30 degrees     Problem: Infection  Goal: Absence of Infection Signs and Symptoms  Intervention: Prevent or Manage Infection  Recent Flowsheet Documentation  Taken 04/13/2024 2000 by Corinthia Sharper, RN  Infection Management: aseptic technique maintained     Problem: Mechanical Ventilation Invasive  Goal: Optimal Device Function  Intervention: Optimize Device Care and Function  Recent Flowsheet Documentation  Taken 04/14/2024 0400 by Corinthia Sharper, RN  Oral Care:   mouth swabbed   suction provided  Taken 04/14/2024 0000 by Corinthia Sharper, RN  Oral Care:   mouth swabbed   suction provided  Goal: Absence of Device-Related Skin and Tissue Injury  Intervention: Maintain Skin and Tissue Health  Recent Flowsheet Documentation  Taken 04/13/2024 2000 by Corinthia Sharper, RN  Device Skin Pressure Protection:   adhesive use limited   tubing/devices free from skin contact  Goal: Absence of Ventilator-Induced Lung Injury  Intervention: Prevent Ventilator-Associated Pneumonia  Recent Flowsheet Documentation  Taken 04/14/2024 0400 by Corinthia Sharper, RN  Head of Bed [facility]) Positioning: HOB at 30 degrees  Oral Care:   mouth swabbed   suction provided  Taken 04/14/2024 0200 by Corinthia Sharper, RN  Head of Bed [facility]) Positioning: HOB at 30 degrees  Taken 04/14/2024 0000 by Corinthia Sharper, RN  Head of Bed [facility]) Positioning: HOB at 30 degrees  Oral Care:   mouth swabbed   suction provided  Taken 04/13/2024 2200 by Corinthia Sharper, RN  Head of Bed [facility]) Positioning: HOB at 30 degrees  Taken 04/13/2024 2000 by Corinthia Sharper, RN  Head of Bed [facility]) Positioning: HOB at 30 degrees     Problem: Mechanical Ventilation Invasive  Goal: Optimal Device Function  Intervention: Optimize Device Care and Function  Recent Flowsheet Documentation  Taken 04/14/2024 0400 by Corinthia Sharper, RN  Oral Care:   mouth swabbed   suction provided  Taken 04/14/2024 0000 by Corinthia Sharper, RN  Oral Care:   mouth swabbed   suction provided  Goal: Absence of Device-Related Skin and Tissue Injury  Intervention: Maintain Skin and Tissue Health  Recent Flowsheet Documentation  Taken 04/13/2024 2000 by Corinthia Sharper, RN  Device Skin Pressure Protection:   adhesive use limited   tubing/devices free from skin contact  Goal: Absence of Ventilator-Induced Lung Injury  Intervention: Prevent Ventilator-Associated Pneumonia  Recent Flowsheet Documentation  Taken 04/14/2024 0400 by Corinthia Sharper, RN  Head of Bed [facility]) Positioning: HOB at 30 degrees  Oral Care:   mouth swabbed   suction provided  Taken 04/14/2024 0200 by Corinthia Sharper, RN  Head of Bed [facility]) Positioning: HOB at 30 degrees  Taken 04/14/2024 0000 by Corinthia Sharper, RN  Head of Bed [facility]) Positioning: HOB at 30 degrees  Oral Care:   mouth swabbed   suction provided  Taken 04/13/2024 2200 by Corinthia Sharper, RN  Head of Bed [facility]) Positioning: HOB at 30 degrees  Taken 04/13/2024 2000 by Corinthia Sharper, RN  Head of Bed [facility]) Positioning: HOB at 30 degrees     Problem: Mechanical Ventilation Invasive  Goal: Optimal Device Function  Intervention: Optimize Device Care and Function  Recent Flowsheet Documentation  Taken 04/14/2024 0400 by Corinthia Sharper, RN  Oral Care:   mouth swabbed   suction provided  Taken 04/14/2024 0000 by Corinthia Sharper, RN  Oral Care:   mouth swabbed suction provided  Goal: Absence of Device-Related Skin and Tissue Injury  Intervention: Maintain Skin and Tissue Health  Recent Flowsheet Documentation  Taken 04/13/2024 2000 by Corinthia Sharper, RN  Device Skin Pressure Protection:   adhesive use limited   tubing/devices free from skin contact  Goal: Absence of Ventilator-Induced Lung Injury  Intervention: Prevent Ventilator-Associated Pneumonia  Recent Flowsheet Documentation  Taken 04/14/2024 0400 by Corinthia Sharper, RN  Head of Bed [facility]) Positioning: HOB at 30 degrees  Oral Care:   mouth swabbed   suction provided  Taken 04/14/2024 0200 by Corinthia Sharper, RN  Head of Bed [facility]) Positioning: HOB at 30 degrees  Taken 04/14/2024 0000 by Corinthia Sharper, RN  Head of Bed [facility]) Positioning: HOB at 30 degrees  Oral Care:   mouth swabbed   suction provided  Taken 04/13/2024 2200 by Corinthia Sharper, RN  Head of Bed [facility]) Positioning: HOB at 30 degrees  Taken 04/13/2024 2000 by Corinthia Sharper, RN  Head of Bed [facility]) Positioning: HOB at 30 degrees     Problem: Artificial Airway  Goal: Optimal Device Function  Intervention: Optimize Device Care and Function  Recent Flowsheet Documentation  Taken 04/14/2024 0400 by Corinthia Sharper, RN  Oral Care:   mouth swabbed   suction provided  Taken 04/14/2024 0000 by Corinthia Sharper, RN  Oral Care:   mouth swabbed   suction provided  Taken 04/13/2024 2000 by Corinthia Sharper, RN  Aspiration Precautions:   awake/alert before oral intake   upright posture maintained  Goal: Absence of Device-Related Skin or Tissue Injury  Intervention: Maintain Skin and Tissue Health  Recent Flowsheet Documentation  Taken 04/13/2024 2000 by Corinthia Sharper, RN  Device Skin Pressure Protection:   adhesive use limited   tubing/devices free from skin contact

## 2024-04-14 NOTE — Plan of Care (Signed)
 Pt RASS -1 and follows commands. On PRVC 30%. Afib w rates 80s-120s. MAPs maintained >65. Afebrile. Adequate urine output. out of FMS today. Bath and CHG given.     Problem: Skin Injury Risk Increased  Goal: Skin Health and Integrity  Intervention: Optimize Skin Protection  Recent Flowsheet Documentation  Taken 04/14/2024 1600 by Christophe Sabra SAILOR, RN  Head of Bed [facility]) Positioning: HOB at 30-45 degrees  Taken 04/14/2024 1200 by Christophe Sabra SAILOR, RN  Head of Bed [facility]) Positioning: HOB at 30-45 degrees  Taken 04/14/2024 0800 by Christophe Sabra SAILOR, RN  Activity Management: in bed  Pressure Reduction Techniques:   frequent weight shift encouraged   heels elevated off bed   weight shift assistance provided  Head of Bed (HOB) Positioning: HOB at 30-45 degrees  Pressure Reduction Devices:   heel offloading device utilized   positioning supports utilized   pressure-redistributing mattress utilized   specialty bed utilized  Skin Protection: adhesive use limited     Problem: Adult Inpatient Plan of Care  Goal: Absence of Hospital-Acquired Illness or Injury  Intervention: Identify and Manage Fall Risk  Recent Flowsheet Documentation  Taken 04/14/2024 0800 by Christophe Sabra SAILOR, RN  Safety Interventions:   aspiration precautions   bed alarm   lighting adjusted for tasks/safety   low bed  Intervention: Prevent Skin Injury  Recent Flowsheet Documentation  Taken 04/14/2024 1600 by Christophe Sabra SAILOR, RN  Positioning for Skin: Left  Taken 04/14/2024 1400 by Christophe Sabra SAILOR, RN  Positioning for Skin: Right  Taken 04/14/2024 1200 by Christophe Sabra SAILOR, RN  Positioning for Skin: Left  Taken 04/14/2024 1000 by Christophe Sabra SAILOR, RN  Positioning for Skin: Right  Taken 04/14/2024 0800 by Christophe Sabra SAILOR, RN  Positioning for Skin: Left  Device Skin Pressure Protection: absorbent pad utilized/changed  Skin Protection: adhesive use limited  Intervention: Prevent Infection  Recent Flowsheet Documentation  Taken 04/14/2024 0800 by Christophe Sabra SAILOR, RN  Infection Prevention: cohorting utilized     Problem: Breathing Pattern Ineffective  Goal: Effective Breathing Pattern  Intervention: Promote Improved Breathing Pattern  Recent Flowsheet Documentation  Taken 04/14/2024 1600 by Christophe Sabra SAILOR, RN  Head of Bed [facility]) Positioning: HOB at 30-45 degrees  Taken 04/14/2024 1200 by Christophe Sabra SAILOR, RN  Head of Bed [facility]) Positioning: HOB at 30-45 degrees  Taken 04/14/2024 0800 by Christophe Sabra SAILOR, RN  Head of Bed [facility]) Positioning: HOB at 30-45 degrees     Problem: Fall Injury Risk  Goal: Absence of Fall and Fall-Related Injury  Intervention: Promote Injury-Free Environment  Recent Flowsheet Documentation  Taken 04/14/2024 0800 by Christophe Sabra SAILOR, RN  Safety Interventions:   aspiration precautions   bed alarm   lighting adjusted for tasks/safety   low bed     Problem: Gas Exchange Impaired  Goal: Optimal Gas Exchange  Intervention: Optimize Oxygenation and Ventilation  Recent Flowsheet Documentation  Taken 04/14/2024 1600 by Christophe Sabra SAILOR, RN  Head of Bed [facility]) Positioning: HOB at 30-45 degrees  Taken 04/14/2024 1200 by Christophe Sabra SAILOR, RN  Head of Bed [facility]) Positioning: HOB at 30-45 degrees  Taken 04/14/2024 0800 by Christophe Sabra SAILOR, RN  Head of Bed [facility]) Positioning: HOB at 30-45 degrees     Problem: Non-Violent Restraints  Intervention: Utilize least restrictive measures  Recent Flowsheet Documentation  Taken 04/14/2024 1600 by Christophe Sabra SAILOR, RN  Less Restrictive Alternative:   Repositioning   Comfort Measures  Taken 04/14/2024 1400 by Yale Golla,  Sabra SAILOR, RN  Less Restrictive Alternative:   Comfort Measures   Repositioning  Taken 04/14/2024 1200 by Christophe Sabra SAILOR, RN  Less Restrictive Alternative:   Repositioning   Comfort Measures  Taken 04/14/2024 1000 by Christophe Sabra SAILOR, RN  Less Restrictive Alternative:   Repositioning   Comfort Measures  Taken 04/14/2024 0800 by Christophe Sabra SAILOR, RN  Less Restrictive Alternative:   Repositioning   Comfort Measures  Intervention: Patient Monitoring  Recent Flowsheet Documentation  Taken 04/14/2024 1600 by Christophe Sabra SAILOR, RN  Psychological Status/Visual Check: Subdued  Circulation/Skin Integrity: No signs of injury  Range of Motion: Performed  Fluids: NPO  Food/Meal: Enteral feeding/TPN  Elimination: Incontinent/patient changed  Taken 04/14/2024 1400 by Christophe Sabra SAILOR, RN  Psychological Status/Visual Check: Subdued  Circulation/Skin Integrity: No signs of injury  Range of Motion: Performed  Fluids: NPO  Food/Meal: Enteral feeding/TPN  Elimination: Incontinent/patient changed  Taken 04/14/2024 1200 by Christophe Sabra SAILOR, RN  Psychological Status/Visual Check: Subdued  Circulation/Skin Integrity: No signs of injury  Range of Motion: Performed  Fluids: NPO  Food/Meal: Enteral feeding/TPN  Elimination: Incontinent/patient changed  Taken 04/14/2024 1000 by Christophe Sabra SAILOR, RN  Psychological Status/Visual Check: Subdued  Circulation/Skin Integrity: No signs of injury  Range of Motion: Performed  Fluids: NPO  Food/Meal: Enteral feeding/TPN  Elimination: Incontinent/patient changed  Taken 04/14/2024 0800 by Christophe Sabra SAILOR, RN  Psychological Status/Visual Check: Subdued  Circulation/Skin Integrity: No signs of injury  Range of Motion: Performed  Fluids: NPO  Food/Meal: Enteral feeding/TPN  Elimination: Incontinent/patient changed     Problem: Wound  Goal: Optimal Functional Ability  Intervention: Optimize Functional Ability  Recent Flowsheet Documentation  Taken 04/14/2024 0800 by Christophe Sabra SAILOR, RN  Activity Management: in bed  Goal: Skin Health and Integrity  Intervention: Optimize Skin Protection  Recent Flowsheet Documentation  Taken 04/14/2024 1600 by Christophe Sabra SAILOR, RN  Head of Bed [facility]) Positioning: HOB at 30-45 degrees  Taken 04/14/2024 1200 by Christophe Sabra SAILOR, RN  Head of Bed [facility]) Positioning: HOB at 30-45 degrees  Taken 04/14/2024 0800 by Christophe Sabra SAILOR, RN  Activity Management: in bed  Pressure Reduction Techniques:   frequent weight shift encouraged   heels elevated off bed   weight shift assistance provided  Head of Bed (HOB) Positioning: HOB at 30-45 degrees  Pressure Reduction Devices:   heel offloading device utilized   positioning supports utilized   pressure-redistributing mattress utilized   specialty bed utilized  Skin Protection: adhesive use limited     Problem: Mechanical Ventilation Invasive  Goal: Optimal Device Function  Intervention: Optimize Device Care and Function  Recent Flowsheet Documentation  Taken 04/14/2024 1600 by Christophe Sabra SAILOR, RN  Oral Care:   mouth swabbed   oral rinse provided   suction provided   teeth brushed   tongue brushed  Taken 04/14/2024 1200 by Christophe Sabra SAILOR, RN  Oral Care:   mouth swabbed   oral rinse provided   suction provided   teeth brushed   tongue brushed  Goal: Absence of Device-Related Skin and Tissue Injury  Intervention: Maintain Skin and Tissue Health  Recent Flowsheet Documentation  Taken 04/14/2024 0800 by Christophe Sabra SAILOR, RN  Device Skin Pressure Protection: absorbent pad utilized/changed  Goal: Absence of Ventilator-Induced Lung Injury  Intervention: Prevent Ventilator-Associated Pneumonia  Recent Flowsheet Documentation  Taken 04/14/2024 1600 by Christophe Sabra SAILOR, RN  Head of Bed [facility]) Positioning: HOB at 30-45 degrees  Oral Care:   mouth swabbed  oral rinse provided   suction provided   teeth brushed   tongue brushed  Taken 04/14/2024 1200 by Christophe Sabra SAILOR, RN  Head of Bed [facility]) Positioning: HOB at 30-45 degrees  Oral Care:   mouth swabbed   oral rinse provided   suction provided   teeth brushed   tongue brushed  Taken 04/14/2024 0800 by Christophe Sabra SAILOR, RN  Head of Bed [facility]) Positioning: HOB at 30-45 degrees     Problem: Mechanical Ventilation Invasive  Goal: Optimal Device Function  Intervention: Optimize Device Care and Function  Recent Flowsheet Documentation  Taken 04/14/2024 1600 by Christophe Sabra SAILOR, RN  Oral Care:   mouth swabbed   oral rinse provided   suction provided   teeth brushed   tongue brushed  Taken 04/14/2024 1200 by Christophe Sabra SAILOR, RN  Oral Care:   mouth swabbed   oral rinse provided   suction provided   teeth brushed   tongue brushed  Goal: Absence of Device-Related Skin and Tissue Injury  Intervention: Maintain Skin and Tissue Health  Recent Flowsheet Documentation  Taken 04/14/2024 0800 by Christophe Sabra SAILOR, RN  Device Skin Pressure Protection: absorbent pad utilized/changed  Goal: Absence of Ventilator-Induced Lung Injury  Intervention: Prevent Ventilator-Associated Pneumonia  Recent Flowsheet Documentation  Taken 04/14/2024 1600 by Christophe Sabra SAILOR, RN  Head of Bed [facility]) Positioning: HOB at 30-45 degrees  Oral Care:   mouth swabbed   oral rinse provided   suction provided   teeth brushed   tongue brushed  Taken 04/14/2024 1200 by Christophe Sabra SAILOR, RN  Head of Bed [facility]) Positioning: HOB at 30-45 degrees  Oral Care:   mouth swabbed   oral rinse provided   suction provided   teeth brushed   tongue brushed  Taken 04/14/2024 0800 by Christophe Sabra SAILOR, RN  Head of Bed [facility]) Positioning: HOB at 30-45 degrees     Problem: Mechanical Ventilation Invasive  Goal: Optimal Device Function  Intervention: Optimize Device Care and Function  Recent Flowsheet Documentation  Taken 04/14/2024 1600 by Christophe Sabra SAILOR, RN  Oral Care:   mouth swabbed   oral rinse provided   suction provided   teeth brushed   tongue brushed  Taken 04/14/2024 1200 by Christophe Sabra SAILOR, RN  Oral Care:   mouth swabbed   oral rinse provided   suction provided   teeth brushed   tongue brushed  Goal: Absence of Device-Related Skin and Tissue Injury  Intervention: Maintain Skin and Tissue Health  Recent Flowsheet Documentation  Taken 04/14/2024 0800 by Christophe Sabra SAILOR, RN  Device Skin Pressure Protection: absorbent pad utilized/changed  Goal: Absence of Ventilator-Induced Lung Injury  Intervention: Prevent Ventilator-Associated Pneumonia  Recent Flowsheet Documentation  Taken 04/14/2024 1600 by Christophe Sabra SAILOR, RN  Head of Bed [facility]) Positioning: HOB at 30-45 degrees  Oral Care:   mouth swabbed   oral rinse provided   suction provided   teeth brushed   tongue brushed  Taken 04/14/2024 1200 by Christophe Sabra SAILOR, RN  Head of Bed [facility]) Positioning: HOB at 30-45 degrees  Oral Care:   mouth swabbed   oral rinse provided   suction provided   teeth brushed   tongue brushed  Taken 04/14/2024 0800 by Christophe Sabra SAILOR, RN  Head of Bed [facility]) Positioning: HOB at 30-45 degrees     Problem: Mechanical Ventilation Invasive  Goal: Optimal Device Function  Intervention: Optimize Device Care and Function  Recent Flowsheet Documentation  Taken 04/14/2024  1600 by Christophe Sabra SAILOR, RN  Oral Care:   mouth swabbed   oral rinse provided   suction provided   teeth brushed   tongue brushed  Taken 04/14/2024 1200 by Christophe Sabra SAILOR, RN  Oral Care:   mouth swabbed   oral rinse provided   suction provided   teeth brushed   tongue brushed  Goal: Absence of Device-Related Skin and Tissue Injury  Intervention: Maintain Skin and Tissue Health  Recent Flowsheet Documentation  Taken 04/14/2024 0800 by Christophe Sabra SAILOR, RN  Device Skin Pressure Protection: absorbent pad utilized/changed  Goal: Absence of Ventilator-Induced Lung Injury  Intervention: Prevent Ventilator-Associated Pneumonia  Recent Flowsheet Documentation  Taken 04/14/2024 1600 by Christophe Sabra SAILOR, RN  Head of Bed [facility]) Positioning: HOB at 30-45 degrees  Oral Care:   mouth swabbed   oral rinse provided   suction provided   teeth brushed   tongue brushed  Taken 04/14/2024 1200 by Christophe Sabra SAILOR, RN  Head of Bed [facility]) Positioning: HOB at 30-45 degrees  Oral Care:   mouth swabbed   oral rinse provided   suction provided   teeth brushed   tongue brushed  Taken 04/14/2024 0800 by Christophe Sabra SAILOR, RN  Head of Bed [facility]) Positioning: HOB at 30-45 degrees     Problem: Mechanical Ventilation Invasive  Goal: Optimal Device Function  Intervention: Optimize Device Care and Function  Recent Flowsheet Documentation  Taken 04/14/2024 1600 by Christophe Sabra SAILOR, RN  Oral Care:   mouth swabbed   oral rinse provided   suction provided   teeth brushed   tongue brushed  Taken 04/14/2024 1200 by Christophe Sabra SAILOR, RN  Oral Care:   mouth swabbed   oral rinse provided   suction provided   teeth brushed   tongue brushed  Goal: Absence of Device-Related Skin and Tissue Injury  Intervention: Maintain Skin and Tissue Health  Recent Flowsheet Documentation  Taken 04/14/2024 0800 by Christophe Sabra SAILOR, RN  Device Skin Pressure Protection: absorbent pad utilized/changed  Goal: Absence of Ventilator-Induced Lung Injury  Intervention: Prevent Ventilator-Associated Pneumonia  Recent Flowsheet Documentation  Taken 04/14/2024 1600 by Christophe Sabra SAILOR, RN  Head of Bed [facility]) Positioning: HOB at 30-45 degrees  Oral Care:   mouth swabbed   oral rinse provided   suction provided   teeth brushed   tongue brushed  Taken 04/14/2024 1200 by Christophe Sabra SAILOR, RN  Head of Bed [facility]) Positioning: HOB at 30-45 degrees  Oral Care:   mouth swabbed   oral rinse provided   suction provided   teeth brushed   tongue brushed  Taken 04/14/2024 0800 by Christophe Sabra SAILOR, RN  Head of Bed [facility]) Positioning: HOB at 30-45 degrees     Problem: Mechanical Ventilation Invasive  Goal: Optimal Device Function  Intervention: Optimize Device Care and Function  Recent Flowsheet Documentation  Taken 04/14/2024 1600 by Christophe Sabra SAILOR, RN  Oral Care:   mouth swabbed   oral rinse provided   suction provided   teeth brushed   tongue brushed  Taken 04/14/2024 1200 by Christophe Sabra SAILOR, RN  Oral Care:   mouth swabbed   oral rinse provided   suction provided   teeth brushed   tongue brushed  Goal: Absence of Device-Related Skin and Tissue Injury  Intervention: Maintain Skin and Tissue Health  Recent Flowsheet Documentation  Taken 04/14/2024 0800 by Christophe Sabra SAILOR, RN  Device Skin Pressure Protection: absorbent pad utilized/changed  Goal: Absence of  Ventilator-Induced Lung Injury  Intervention: Prevent Ventilator-Associated Pneumonia  Recent Flowsheet Documentation  Taken 04/14/2024 1600 by Christophe Sabra SAILOR, RN  Head of Bed [facility]) Positioning: HOB at 30-45 degrees  Oral Care:   mouth swabbed   oral rinse provided   suction provided   teeth brushed   tongue brushed  Taken 04/14/2024 1200 by Christophe Sabra SAILOR, RN  Head of Bed [facility]) Positioning: HOB at 30-45 degrees  Oral Care:   mouth swabbed   oral rinse provided   suction provided   teeth brushed   tongue brushed  Taken 04/14/2024 0800 by Christophe Sabra SAILOR, RN  Head of Bed [facility]) Positioning: HOB at 30-45 degrees     Problem: Mechanical Ventilation Invasive  Goal: Optimal Device Function  Intervention: Optimize Device Care and Function  Recent Flowsheet Documentation  Taken 04/14/2024 1600 by Christophe Sabra SAILOR, RN  Oral Care:   mouth swabbed   oral rinse provided   suction provided   teeth brushed   tongue brushed  Taken 04/14/2024 1200 by Christophe Sabra SAILOR, RN  Oral Care:   mouth swabbed   oral rinse provided   suction provided   teeth brushed   tongue brushed  Goal: Absence of Device-Related Skin and Tissue Injury  Intervention: Maintain Skin and Tissue Health  Recent Flowsheet Documentation  Taken 04/14/2024 0800 by Christophe Sabra SAILOR, RN  Device Skin Pressure Protection: absorbent pad utilized/changed  Goal: Absence of Ventilator-Induced Lung Injury  Intervention: Prevent Ventilator-Associated Pneumonia  Recent Flowsheet Documentation  Taken 04/14/2024 1600 by Christophe Sabra SAILOR, RN  Head of Bed [facility]) Positioning: HOB at 30-45 degrees  Oral Care:   mouth swabbed   oral rinse provided   suction provided   teeth brushed   tongue brushed  Taken 04/14/2024 1200 by Christophe Sabra SAILOR, RN  Head of Bed [facility]) Positioning: HOB at 30-45 degrees  Oral Care:   mouth swabbed   oral rinse provided   suction provided   teeth brushed   tongue brushed  Taken 04/14/2024 0800 by Christophe Sabra SAILOR, RN  Head of Bed [facility]) Positioning: HOB at 30-45 degrees     Problem: Artificial Airway  Goal: Optimal Device Function  Intervention: Optimize Device Care and Function  Recent Flowsheet Documentation  Taken 04/14/2024 1600 by Christophe Sabra SAILOR, RN  Oral Care:   mouth swabbed   oral rinse provided   suction provided   teeth brushed   tongue brushed  Taken 04/14/2024 1200 by Christophe Sabra SAILOR, RN  Oral Care:   mouth swabbed   oral rinse provided   suction provided   teeth brushed   tongue brushed  Taken 04/14/2024 0800 by Christophe Sabra SAILOR, RN  Aspiration Precautions: awake/alert before oral intake  Goal: Absence of Device-Related Skin or Tissue Injury  Intervention: Maintain Skin and Tissue Health  Recent Flowsheet Documentation  Taken 04/14/2024 0800 by Christophe Sabra SAILOR, RN  Device Skin Pressure Protection: absorbent pad utilized/changed

## 2024-04-14 NOTE — Plan of Care (Signed)
 No changes made. No adverse affects noted. All emergency equipment bedside. Will continue to monitor.    Problem: Mechanical Ventilation Invasive  Goal: Optimal Device Function  Intervention: Optimize Device Care and Function  Recent Flowsheet Documentation  Taken 04/14/2024 1525 by Heron Connors, RRT  Airway/Ventilation Management:   airway patency maintained   humidification applied   pulmonary hygiene promoted  Taken 04/14/2024 0925 by Heron Connors, RRT  Airway/Ventilation Management:   airway patency maintained   humidification applied   pulmonary hygiene promoted  Oral Care:   mouth swabbed   oral rinse provided   suction provided   teeth brushed   tongue brushed  Goal: Absence of Ventilator-Induced Lung Injury  Intervention: Prevent Ventilator-Associated Pneumonia  Recent Flowsheet Documentation  Taken 04/14/2024 1525 by Heron, Cris Talavera, RRT  Head of Bed [facility]) Positioning: HOB at 30-45 degrees  VAP Prevention Bundle:   HOB elevation maintained   oral care regularly provided   vent circuit breaks minimized  Taken 04/14/2024 0925 by Heron, Desia Saban, RRT  Head of Bed [facility]) Positioning: HOB at 30-45 degrees  VAP Prevention Bundle:   HOB elevation maintained   oral care regularly provided   vent circuit breaks minimized  Oral Care:   mouth swabbed   oral rinse provided   suction provided   teeth brushed   tongue brushed     Problem: Artificial Airway  Goal: Effective Communication  Outcome: Ongoing - Unchanged  Goal: Optimal Device Function  Outcome: Ongoing - Unchanged  Intervention: Optimize Device Care and Function  Recent Flowsheet Documentation  Taken 04/14/2024 1525 by Heron Connors, RRT  Airway/Ventilation Management:   airway patency maintained   humidification applied   pulmonary hygiene promoted  Taken 04/14/2024 0925 by Kimberlie Csaszar, RRT  Airway/Ventilation Management:   airway patency maintained   humidification applied   pulmonary hygiene promoted  Oral Care:   mouth swabbed   oral rinse provided   suction provided   teeth brushed   tongue brushed  Goal: Absence of Device-Related Skin or Tissue Injury  Outcome: Ongoing - Unchanged

## 2024-04-14 NOTE — Progress Notes (Signed)
 MICU Daily Progress Note     Date of Service: 04/14/2024    Problem List:   Principal Problem:    Bacteremia, escherichia coli  Active Problems:    Alcoholic liver disease (HHS-HCC)    HTN (hypertension)    Depression with anxiety    Class 2 severe obesity with serious comorbidity in adult    Atrial fibrillation    (CMS-HCC)    Pleural effusion    Hypernatremia    Thrombocytopenia    Cirrhosis    (CMS-HCC)    A-fib (CMS-HCC)    Hepatic encephalopathy    (CMS-HCC)      Interval history: [name] is a 78 y.o. female with HFpEF, HTN, Afib on AC, MASLD cirrhosis, originally hospitalized for scheduled TEE/DCCV (aborted d/t LAA clot) c/b AHHRF, encephalopathy and septic shock requiring MICU care, was s/p extubation transferred to [facility] for further management of anticoagulation and hypernatremia. Transferred back to [facility] MICU for closer monitoring for respiratory status, pressor requirement, GI bleed, and management of carotid artery injury.     On 10/24, patient had acute worsening of respiratory status leading requiring intubation. When patient was given sedation for intubation, she coded and promptly had ROSC after a few minutes of CPR. Subsequently, CVC placed in neck, used to draw blood, unclear if given pressors through it, then found  to be in arterial system based on blood gas and imaging, likely right carotid. Line was subsequently removed, and pressure was held for 15 minutes, hemostasis with no hematoma. She was transferred to [facility] MICU for management of this carotid injury, to be evaluated by vascular surgery while also managing her increased vasopressor requirement and respiratory distress.  Has been weaned off pressors since 10/27 at 1 PM. VBG's indicating that she is ready for extubation, however her mentation suggests that she is not able to protect her airway.  She was extubated on 10/31, and mentation slowly improved.  Unfortunately on the morning of 11/2 she had increased lethargy, increased secretions, and she was not protecting her airway.  She was reintubated on 11/2, and required pressor support with norepinephrine and vasopressin after the induction agents.  Self extubated on 11/5. Re intubated on 11/6 due to hypercarbia and increased work of breathing.  We are left with the challenging options regarding the care of [name].  Due to her multiple failed extubations, we are concerned [name] will need a tracheostomy long-term to protect her airway.  There is a meeting with palliative care and family on 04/13/2024, her family agreed for tracheostomy.  ENT to assess.    24 hour events:  - ENT paged for trach   - passed TOV   - arterial line has been removed   - unhold metoprolol to help facilitate HR goal for trach eval (start at 25mg  BID)   - K Phos     Neurological   Hepatic encephalopathy  Per chart review at [facility], rapid response called on 10/24 for increased somnolence. At the time, differential included hepatic encephalopathy, toxic metabolic encephalopathy in the setting of shock (possibly sepsis), versus intracranial pathology. She was subsequently intubated. CT head on 10/24 showed no acute intracranial abnormality. Weaned off sedation with slow improvements in neurological status.  While we have continued lactulose  therapy and seeing good response with several bowel movements, her mentation remains poor.  This is concerning for severe ICU delirium.  Meeting was had with daughter, [name], on 11/5.  Described that should intubation be necessary we  would likely be moving towards a tracheostomy as [place] has continuously not been protecting her airway.  As of now [name] wanted to pursue aggressive measures including repeat intubation and potential tracheostomy.  However she stated that she will talk it over with her sister and the rest of her family.  - Continue lactulose , target 3-5 BMs daily.  MRI brain was impeded due to movement, however with we saw was unremarkable.  - Continue lactulose  and rifaximin    -continues to follow commands    Analgesia: No pain issues  RASS at goal? N/A, not on sedation  [place] Agitation Assessment Scale (RASS) : -1 (04/14/2024  6:00 AM)    Pulmonary   Acute Hypoxic Hypercarbic Respiratory Failure   Rapid response called on 10/24 for increased somnolence, found to have acute hypercarbic respiratory failure and subsequently intubated. Suspect hypercarbic respiratory failure due to encephalopathy as above. CXR on 10/24 showed worsening pleural effusions. Not currently getting diuresed. Given patient's hypoxia, and AMS, there was concern for infectious etiology. MRSA nares negative. Infectious workup thus far is unremarkable. SBTs show readiness extubation based on VBGs, however there is concern that she continues to be unable to protect her airway due to her current mentation.  Reintubated on 11/2 followed by therapeutic bronchoscopy.  Self extubated on 11/5.  Reintubate noted on 11/6 due to hypercarbia and not protecting her airway.    -vented for hypercarbic respiratory failure due to hepatic encephalopathy with multiple failed attempts of extubations  - ENT agreed to see patient, awaiting further plan/guidance from them  - Stable ABG, will keep vent settings as is    Vent Mode: PRVC  FiO2 (%): 30 %  S RR: 14  S VT: 320 mL  PEEP: 5 cm H20  PR SUP: 15 cm H20        Cardiovascular   Carotid Artery Injury (resolved)  At [facility], CVC placed intended for RIJ on 10/24 following intubation, PEA, ROSC. Was used with known blood return. Unclear if it was used for pressor support, but highly likely that it was. CXR for placement check showed concern for intra aterial location, and blood gas confirmed it was arterial. Thought to be in carotid artery. CVC removed with pressure held for 15 minutes, with no hematoma formation. On arrival, no physical exam and bedside ultrasound showed no large hematoma. No active bleeding.   - Vascular Surgery consulted, stated fistula has healed based on Ultrasound findings    PEA arrest s/p ROSC  Hypovolemic/Hemorrhagic shock (resolved)  Atrial fibrillation  known left atrial appendage clot  HFpEF  Patient with PEA arrest peri-intubation with ROSC. Shock thought to be hemorrhagic in the setting of GI bleed History of atrial fibrillation with known LAA clot and HFpEF. Vasopressors were weaned off 10/27. APTT was elevated the morning of 10/28 necessitating changing anticoagulation from therapeutic heparin to therapeutic Lovenox .   - Holding Coreg   - Started heparin drip for anticoagulation for LAA clot  - Restarting her metoprolol to help facilitate heart rate goal for trach eval (25 mg twice daily)    -Continues to stay hemodynamically stable on no pressors    Renal   Hypernatremia   At [facility], had persistently hypernatremia near 153. Had been treated with D5 gtt. Sodium throughout admission has largely been 146-148. Sodium was 151 morning of 10/28, presume this will decrease once we start tube feeds.  Sodium has overall normalized and continues to be within normal limits.  - Strict I/O's  - Sodium checks  -  foley removed, passed voiding trial  -potassium and phosphate repleated this AM      Infectious Disease/Autoimmune   E. Coli Bacteremia (resolved),   Initially admitted for afib, found to have E. coli bacteremia on October 10. This was treated with cefazolin . Rapid response called 10/24, patient noted to be hypothermic. WBCs jumped from 3.0 to 9.7 on 10/24. Although the most recent lab was following intubation and CPR. UA with pyuria, rare bacteria, hematuria. Peri-intubation, patient developed significant hypotension requiring initiation of NE and vasopressin. Suspect possible septic shock with unknown infectious source. Patient started on vancomycin and cefepime 10/24. CT Abdomen pelvis concerning for aspiration pneumonia. Following infectious workup was unremarkable. Antibiotics were stopped on 10/27.  Infectious workup was repeated on 11/3 due to reintubation and status post bronchoscopy.    -on ceftriaxone for SBP, will cover for MSSA    Cultures:  Blood Culture, Routine (no units)   Date Value   04/09/2024 No Growth at 4 days   04/09/2024 No Growth at 4 days     Urine Culture, Comprehensive (no units)   Date Value   04/09/2024 NO GROWTH   04/04/2024 NO GROWTH     Lower Respiratory Culture (no units)   Date Value   04/10/2024 2+ Methicillin-Susceptible Staphylococcus aureus (A)   04/10/2024 1+ Oropharyngeal Flora Isolated     WBC (10*9/L)   Date Value   04/14/2024 4.1     WBC, UA (/HPF)   Date Value   04/10/2024 7 (H)       FEN/GI   Sigmoid Mass c/f Malignancy and Hematochezia (resolved)  Hemorrhagic Shock (resolved)  CT abdomen pelvis done 10/24 with evidence of cirrhosis and masslike thickening of the lower sigmoid colon concerning for malignancy.  CTA abdomen pelvis performed 1024 with active extravasation into the gastric fundus possibly secondary to traumatic enteric tube placement.  GI scoped the patient on 10/25 and clipped an area of active bleeding.  They removed a total of 1.5 L of blood.  Hemoglobin 10/20 6 AM downtrended to 6.0, so 2 units of blood transfused.  GI stated this was consistent with 10/25 scope and likely did not represent increased bleeding. Hb steadily improving with Hb of 9.2 on 10/28.   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 Given shock of unclear etiology. CTAP demonstrating cirrhosis. Likely that cirrhosis is contributing to hypotension.   - Continue lactulose ; target 3-5 BMs daily  - NGT in place, on TF and FWF  - Daily MELD labs and CMP     MELD 3.0: 15 at 04/05/2024  4:31 AM  MELD-Na: 13 at 04/05/2024  4:31 AM  Calculated from:  Serum Creatinine: 0.43 mg/dL (Using min of 1 mg/dL) at 88/09/7972  5:68 AM  Serum Sodium: 143 mmol/L (Using max of 137 mmol/L) at 04/05/2024  4:31 AM  Total Bilirubin: 1.3 mg/dL at 88/09/7972  5:68 AM  Serum Albumin: 2.4 g/dL at 88/09/7972  5:68 AM  INR(ratio): 1.61 at 04/03/2024 10:26 AM  Age at listing (hypothetical): 78 years  Sex: Female at 04/05/2024  4:31 AM    Provider Malnutrition Assessment:  Body mass index is 39.22 kg/m??. BMI Interpretation: >/= 30 and < 40, consistent with obesity, clinically significant requiring additional resources and complicating multiple aspects of patient care.  GLIM criteria:   Pt does not  meet criteria  -I have screened this patient for malnutrition and they did NOT meet criteria for malnutrition based on GLIM criteria.  -TF and FWF; Dietician consulted, appreciate assistance    Heme/Coag   Acute blood loss anemia  Hemoglobin down trended to 6.0 on the morning of 10/26 thought to be secondary to acute GI bleed that was scoped and clipped 10/25. Patient received 2 units PRBC 10/26 and an additional unit 10/27. Hb steadily improving with Hb of 9.2 on 10/28.   - Continue to follow hemoglobin  -repeat Hemoglobin improved with 1 unit PRBCs, then downtrend again this morning to 7.9    TT: hemoglobin rose to 8.5    Endocrine   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)   - Continue home anastrozole     Integumentary   NAI   #  - WOCN consulted for high risk skin assessment No. Reason: Not indicated.    Prophylaxis/LDA/Restraints/Consults   ICU Checklist completed: yes (see ICU rounding navigator in Epic)    Patient Lines/Drains/Airways Status       Active Active Lines, Drains, & Airways       Name Placement date Placement time Site Days    ETT  7.5 04/07/24  1259  -- 6    NG/OG Tube Feedings 10 Fr. Right nostril 04/01/24  1138  Right nostril  12    External Urinary Device 04/13/24 With Suction 04/13/24  1700  -- less than 1    PICC Double Lumen 04/06/24 Left Basilic 04/06/24  1558  Basilic  7    Arterial Line 04/03/24 Right Radial 04/03/24 0830  Radial  10                  Patient Lines/Drains/Airways Status       Active Wounds       Name Placement date Placement time Site Days    Wound 03/01/24 Other (Comment) Toe (Comment which one) Left;Other (Comment) Blister present on arrival, tx already in outpatient setting, in process of healing, surrounding erythema 03/01/24  0129  Toe (Comment which one)  44    Wound 03/13/24 Irritant Contact Dermatitis Incontinence Sacrum Mid gluteal cleft MASD? 03/13/24  --  Sacrum  32                  Goals of Care     Code Status:   Orders Placed This Encounter   Procedures    Full Code     Standing Status:   Standing     Number of Occurrences:   1        Designated Healthcare Decision Maker:  Ms. Crocker designated healthcare decision maker(s) is/are   HCDM (patient stated preference): Paganelli,[name] - Spouse - [phone]    HCDM (patient stated preference): Hett,[name] - Daughter - [phone]. See HCDM section of Epic sidebar/storyboard or ACP tab in patient chart for details regarding active HCDMs and patient capacity for decision-making.      Subjective     Patient intubated this morning on my exam. Follows commands and nods head yes and no.     Objective     Vitals - past 24 hours  Temp:  [37.3 ??C (99.1 ??F)-37.9 ??C (100.3 ??F)] 37.9 ??C (100.3 ??F)  Pulse:  [92-126] 122  SpO2 Pulse:  [83-126] 121  Resp:  [13-36] 26  FiO2 (%):  [30 %] 30 %  SpO2:  [92 %-100 %] 98 % Intake/Output  I/O  last 3 completed shifts:  In: 3557.3 [P.O.:180; I.V.:470.8; Blood:396.5; NG/GT:2410; IV Piggyback:100]  Out: 3225 [Urine:2525; Stool:700]     Physical Exam:    General: intubated on my examination. Arouses to voice, answers yes/no questions  HEENT: normocephalic, atraumatic   CV: pulses palpable, regular borderline tachycardia   Pulm: Rhonchi w transmitted upper airway sounds throughout  GI: soft, NTND, + BS  MSK: edema noted to her knees bilaterally, no clubbing or cyanosis  Skin: numerous large flat violaceous ecchymosis on left arm, chest, neck bilaterally.   Neuro: Withdrawals to painful stimuli. Responding to name and reliably following commands.     Continuous Infusions:   Infusions Meds[1]    Scheduled Medications:   Scheduled Medications[2]    PRN medications:  PRN Medications[3]    Data/Imaging Review: Reviewed in Epic and personally interpreted on 04/14/2024. See EMR for detailed results.      Waddell Mead, DO  PGY1 Emergency Medicine, [facility]                 [1]    heparin 11 Units/kg/hr (04/14/24 0600)    NORepinephrine bitartrate-NS Stopped (04/10/24 2130)   [2]    cefTRIAXone  2 g Intravenous Q24H    cyanocobalamin (vitamin B-12)  1,000 mcg Enteral tube: gastric Daily    esomeprazole  40 mg Enteral tube: gastric Daily    flu vac 2025 [id number](PF)  0.5 mL Intramuscular During hospitalization    folic acid   1 mg Enteral tube: gastric Daily    lactulose   20 g Enteral tube: gastric TID    melatonin  3 mg Oral QPM    [Provider Hold] metoPROLOL tartrate  25 mg Enteral tube: gastric BID    multivitamins (ADULT)  1 tablet Enteral tube: gastric Daily    potassium chloride   40 mEq Enteral tube: gastric BID    rifAXIMin   550 mg Enteral tube: gastric BID    sodium chloride   10 mL Intravenous Q8H    sodium chloride   10 mL Intravenous Q8H    thiamine  mononitrate (vit B1)  100 mg Enteral tube: gastric Daily   [3] acetaminophen , docusate sodium, fentaNYL  (PF), haloperidol LACTATE **OR** [DISCONTINUED] haloperidol LACTATE, heparin (porcine)

## 2024-04-14 NOTE — Progress Notes (Signed)
 Patient rested overnight on PCV and was suctioned for a moderate amount of thick tan secretions.  She was trialed on pressure support 15/+5 this morning which she did not tolerate due to respiratory rate in the low 40s and tidal volumes <250cc consistently.  After a few minutes, patient was switched to PRVC for more consistent tidal volumes.  On PRVC, respiratory rate improved to the low 20s.

## 2024-04-14 NOTE — Consults (Signed)
 Otolaryngology Head and Neck Surgery:  Consult Note    Requesting Attending Physician:  Morene Lang Early, MD  Service Requesting Consult: MICU    Assessment and Plan:     The patient is a 78 year old F with history of HFpEF, HTN, Afib on AC, MASLD cirrhosis, originally hospitalized for scheduled TEE/DCCV complicated by chronic respiratory failure requiring prolonged intubation. ENT is consulted for tracheostomy.     - Plan for open tracheostomy 11/14 -today.  - Risks of tracheostomy were discussed including anesthesia, bleeding, loss of airway, cardiac arrest, tracheal scarring, tracheal stenosis, optimistically temporary and potentially permanent tracheotomy, neck scar or tethering or cosmetic deformity.  - Will obtain consent  - Patient has been NPO since midnight and heparin gtt held since 2 am.    - Please page the ENT consult pager for further questions or concerns [phone]).      Subjective:     [name] is a 78 y.o. female with a PMH of  HFpEF, HTN, Afib on AC, MASLD cirrhosis, originally hospitalized for scheduled TEE/DCCV (aborted d/t LAA clot) c/b AHHRF, encephalopathy and septic shock requiring ICU level of care.       Intubation date: intubated for AMS for hepatic encephalopathy 10/24-10/31; reintubated 2/2 to lethargy, secretions 11/2 - 11/5; reintubated 2/2 hypercarbia/increased WOB 11/6 - present  Current ventilator settings: 30% FiO2, PEEP 5  Current vasopressors: None  Anticoagulation: heparin for afib  BMI: 39.22  Prior head or neck surgery: None    Past Medical/Surgical History:     Past Medical History[1]    Past Surgical History[2]    Medications:     Current Medications[3]    Allergies:     Allergies[4]    Family History:     Family History[5]    Social History:     Social History [6]    Objective:     Vitals Signs: BP 112/57  - Pulse 122  - Temp (!) 37.9 ??C (100.3 ??F) (Axillary)  - Resp 26  - Ht 162.6 cm (5' 4.02)  - Wt (!) 103.7 kg (228 lb 9.9 oz)  - SpO2 98%  - BMI 39.22 kg/m??     Vitals: 04/14/24 0321 04/14/24 0400 04/14/24 0500 04/14/24 0600   BP:       Pulse: 109 111 121 122   Resp: 19 26 (!) 32 26   Temp:       TempSrc:       SpO2: 100% 98% 98% 98%   Weight:       Height:           Physical Exam  General: Well-developed, intubated and sedated  Head/Face: On external examination there is no obvious asymmetry or scars.   Eyes: Conjunctiva are not injected and sclera is non-icteric.  Ears: On external exam, there is no obvious lesions or asymmetry. The EACs are patent bilaterally.   Nose: External examination of the nose reveals no masses, lesions, or deformity.    Oral cavity/oropharynx: Full oral exam limited by ETT  Neck: There is no asymmetry or masses. Trachea is midline. Palpable laryngeal landmarks.  Lymphatics: There is no palpable lymphadenopathy along the jugulodiagastric, submental, or posterior cervical chains.  Neurologic: Full neurological exam difficult with sedation  Extremities: No cyanosis, edema of all four extremities    Test Results    Labs: Reviewed in Epic    Imaging: Reviewed in Epic    Procedures:     None indicated  _______________  ______________         [  1]   Past Medical History:  Diagnosis Date    A-fib (CMS-HCC)     Alcoholism    (CMS-HCC)     Alcoholism /alcohol abuse     Cirrhosis    (CMS-HCC)     Depression     Hypertension     Liver disease     Malignant neoplasm of overlapping sites of right breast in female, estrogen receptor positive    (CMS-HCC) 10/03/2020   [2]   Past Surgical History:  Procedure Laterality Date    BREAST BIOPSY Right     benign-a long time ago    BREAST BIOPSY Right 07/2020    malignant    BREAST LUMPECTOMY Right     4 2022    CENTRAL LINE  03/25/2024    CHOLECYSTECTOMY      PR BX/REMV,LYMPH NODE,DEEP AXILL Right 09/03/2020    Procedure: BX/EXC LYMPH NODE; OPEN, DEEP AXILRY NODE;  Surgeon: Alm Elsie Como, MD;  Location: [facility];  Service: Surgical Oncology Breast    PR CARDIOVERSION, ELECTIVE;EXTERN N/A 03/10/2024    Procedure: CARDIOVERSION, ELECTIVE, ELECTRICAL CONVERSION OF ARRHYTHMIA; EXTERNAL;  Surgeon: Sedalia Velma Hamilton, MD;  Location: [facility] OR [facility];  Service: Cardiology    PR ECHO HEART,TRANSESOPHAGEAL,COMPLETE Midline 03/10/2024    Procedure: ECHOCARDIOGRAPHY, TRANSESOPHAGEAL, REAL-TIME WITH IMAGE DOCUMENTATION;  Surgeon: Sedalia Velma Hamilton, MD;  Location: [facility] OR [facility];  Service: Cardiology    PR INTRAOPERATIVE SENTINEL LYMPH NODE ID W DYE INJECTION Right 09/03/2020    Procedure: INTRAOPERATIVE IDENTIFICATION SENTINEL LYMPH NODE(S) INCLUDE INJECTION NON-RADIOACTIVE DYE, WHEN PERFORMED;  Surgeon: Alm Elsie Como, MD;  Location: [facility] OR [facility];  Service: Surgical Oncology Breast    PR MASTECTOMY, PARTIAL Right 09/03/2020    Procedure: MASTECTOMY, PARTIAL (EG, LUMPECTOMY, TYLECTOMY, QUADRANTECTOMY, SEGMENTECTOMY);  Surgeon: Alm Elsie Como, MD;  Location: [facility] OR [facility];  Service: Surgical Oncology Breast    PR UPPER GI ENDOSCOPY,BIOPSY N/A 02/03/2018    Procedure: UGI ENDOSCOPY; WITH BIOPSY, SINGLE OR MULTIPLE;  Surgeon: Eleanor Dewey Sorrel, MD;  Location: [facility];  Service: Gastroenterology    RADIATION Right     unsure finish date    TUBAL LIGATION     [3]   Current Facility-Administered Medications   Medication Dose Route Frequency Provider Last Rate Last Admin    acetaminophen  (TYLENOL ) tablet 650 mg  650 mg Oral Q4H PRN Sugarman, Lauren A, MD   650 mg at 04/12/24 1214    cefTRIAXone (ROCEPHIN) 2 g in sodium chloride  0.9 % (NS) 100 mL IVPB-MBP  2 g Intravenous Q24H Anwer, Ayesha S, MD   Stopped at 04/13/24 1056    cyanocobalamin (vitamin B-12) tablet 1,000 mcg  1,000 mcg Enteral tube: gastric Daily Wadell Prentice BIRCH, MD   1,000 mcg at 04/13/24 0813    docusate sodium (COLACE) capsule 100 mg  100 mg Oral BID PRN Sugarman, Lauren A, MD        esomeprazole (NEXIUM) granules 40 mg  40 mg Enteral tube: gastric Daily Ancell, Kaitlyn R, MD   40 mg at 04/13/24 0813    fentaNYL  (PF) (SUBLIMAZE ) injection 25 mcg  25 mcg Intravenous Q2H PRN Seilheimer, Taylor M, DO   25 mcg at 04/13/24 1744    flu vacc 2025-26 (65 yr up) (PF)(FLUAD)45 mcg(86mcgx3)/0.5 ml IM syringe  0.5 mL Intramuscular During hospitalization Sugarman, Tinnie LABOR, MD        folic acid  (FOLVITE ) tablet 1 mg  1 mg Enteral tube: gastric Daily Wadell Prentice  D, MD   1 mg at 04/13/24 0813    haloperidol LACTATE (HALDOL) injection 5 mg  5 mg Intravenous Q6H PRN Anwer, Ayesha S, MD   5 mg at 04/11/24 1115    heparin (porcine) 1000 unit/mL injection 2,000 Units  2,000 Units Intravenous Q6H PRN Dorise Hove, MD        heparin 25,000 Units/250 mL (100 units/mL) in 0.45% saline infusion (premade)  0-24 Units/kg/hr Intravenous Continuous Dorise Hove, MD 11.41 mL/hr at 04/14/24 0700 11 Units/kg/hr at 04/14/24 0700    lactulose  oral solution  20 g Enteral tube: gastric TID Ione Dreama RAMAN, MD   20 g at 04/13/24 2042    melatonin tablet 3 mg  3 mg Oral QPM Mendez Borrero, Tessa CROME, MD   3 mg at 04/13/24 1713    [Provider Hold] metoPROLOL tartrate (Lopressor) tablet 25 mg  25 mg Enteral tube: gastric BID Tor Sousa, Yainira L, MD        multivitamins, therapeutic with minerals tablet 1 tablet  1 tablet Enteral tube: gastric Daily Ancell, Kaitlyn R, MD   1 tablet at 04/13/24 0813    NORepinephrine 8 mg in dextrose  5 % 250 mL (32 mcg/mL) infusion PMB  0-30 mcg/min Intravenous Continuous Tor Sousa, Tessa CROME, MD   Stopped at 04/10/24 2130    potassium chloride  (KLOR-CON ) packet 40 mEq  40 mEq Enteral tube: gastric BID Ione Dreama RAMAN, MD   40 mEq at 04/14/24 0618    rifAXIMin  (XIFAXAN ) oral suspension  550 mg Enteral tube: gastric BID Ancell, Kaitlyn R, MD   550 mg at 04/13/24 2042    sodium chloride  (NS) 0.9 % flush 10 mL  10 mL Intravenous Q8H Anwer, Ayesha S, MD   10 mL at 04/13/24 1841    sodium chloride  (NS) 0.9 % flush 10 mL  10 mL Intravenous Q8H Anwer, Ayesha S, MD   10 mL at 04/13/24 1840    thiamine  mononitrate (vit B1) tablet 100 mg  100 mg Enteral tube: gastric Daily Harmody, Andrew D, MD   100 mg at 04/13/24 0813   [4]   Allergies  Allergen Reactions    Sulfa (Sulfonamide Antibiotics) Anaphylaxis    Sulfur  Anaphylaxis     Tolerated sulfur  colloid injection without incident 09/03/2020.    Aspirin      thrombocytopenia   [5]   Family History  Problem Relation Age of Onset    Heart disease Mother     No Known Problems Father     No Known Problems Sister     No Known Problems Daughter     No Known Problems Maternal Grandmother     No Known Problems Maternal Grandfather     No Known Problems Paternal Grandmother     No Known Problems Paternal Grandfather     Lupus Brother     No Known Problems Other     BRCA 1/2 Neg Hx     Breast cancer Neg Hx     Cancer Neg Hx     Colon cancer Neg Hx     Endometrial cancer Neg Hx     Ovarian cancer Neg Hx     Mental illness Neg Hx     Substance Abuse Disorder Neg Hx    [6]   Social History  Socioeconomic History    Marital status: Married   Tobacco Use    Smoking status: Never     Passive exposure: Past    Smokeless tobacco: Never  Vaping Use    Vaping status: Never Used   Substance and Sexual Activity    Alcohol use: Not Currently    Drug use: Never    Sexual activity: Not Currently   Social History Narrative    Daughter fills med boxes weekly.      Social Drivers of Health     Food Insecurity: No Food Insecurity (03/02/2024)    Hunger Vital Sign     Worried About Running Out of Food in the Last Year: Never true     Ran Out of Food in the Last Year: Never true   Tobacco Use: Low Risk (03/30/2024)    Patient History     Smoking Tobacco Use: Never     Smokeless Tobacco Use: Never     Passive Exposure: Past   Transportation Needs: No Transportation Needs (03/02/2024)    PRAPARE - Transportation     Lack of Transportation (Medical): No     Lack of Transportation (Non-Medical): No   Alcohol Use: Not At Risk (04/20/2023)    Alcohol Use     How often do you have a drink containing alcohol?: Never     How many drinks containing alcohol do you have on a typical day when you are drinking?: 1 - 2     How often do you have 5 or more drinks on one occasion?: Never   Housing: Low Risk (03/02/2024)    Housing     Within the past 12 months, have you ever stayed: outside, in a car, in a tent, in an overnight shelter, or temporarily in someone else's home (i.e. couch-surfing)?: No     Are you worried about losing your housing?: No   Physical Activity: Inactive (04/20/2023)    Exercise Vital Sign     Days of Exercise per Week: 0 days     Minutes of Exercise per Session: 0 min   Utilities: Low Risk (04/20/2023)    Utilities     Within the past 12 months, have you been unable to get utilities (heat, electricity) when it was really needed?: No   Stress: No Stress Concern Present (04/20/2023)    [employer] of Occupational Health - Occupational Stress Questionnaire     Feeling of Stress : Only a little   Interpersonal Safety: Patient Unable To Answer (03/11/2024)    Interpersonal Safety     Unsafe Where You Currently Live: Patient unable to answer     Physically Hurt by Anyone: Patient unable to answer     Abused by Anyone: Patient unable to answer   Substance Use: Low Risk (04/20/2023)    Substance Use     In the past year, how often have you used prescription drugs for non-medical reasons?: Never     In the past year, how often have you used illegal drugs?: Never     In the past year, have you used any substance for non-medical reasons?: No   Social Connections: Moderately Isolated (04/20/2023)    Social Connection and Isolation Panel     Frequency of Communication with Friends and Family: More than three times a week     Frequency of Social Gatherings with Friends and Family: More than three times a week     Attends Religious Services: Never     Database Administrator or Organizations: No     Attends Banker Meetings: Never     Marital Status: Married   Programmer, Applications: Low Risk (03/02/2024)  Overall Financial Resource Strain (CARDIA)     Difficulty of Paying Living Expenses: Not hard at all   Health Literacy: Low Risk (04/20/2023)    Health Literacy     : Never   Internet Connectivity: No Internet connectivity concern identified (04/20/2023)    Internet Connectivity     Do you have access to internet services: Yes     How do you connect to the internet: Personal Device at home     Is your internet connection strong enough for you to watch video on your device without major problems?: Yes     Do you have enough data to get through the month?: Yes     Does at least one of the devices have a camera that you can use for video chat?: Yes

## 2024-04-15 LAB — BLOOD GAS, VENOUS
BASE EXCESS VENOUS: 4.3 — ABNORMAL HIGH (ref -2.0–2.0)
BASE EXCESS VENOUS: 1.4 (ref -2.0–2.0)
CARBOXYHEMOGLOBIN, VENOUS: 1.9 % — ABNORMAL HIGH (ref <1.2)
CARBOXYHEMOGLOBIN, VENOUS: 1.9 % — ABNORMAL HIGH (ref <1.2)
HCO3 VENOUS: 29 mmol/L — ABNORMAL HIGH (ref 22–27)
HCO3 VENOUS: 26 mmol/L (ref 22–27)
METHEMOGLOBIN, VENOUS: <1 % (ref <1.5)
METHEMOGLOBIN, VENOUS: <1 % (ref <1.5)
O2 SATURATION VENOUS: 69.8 % (ref 40.0–85.0)
O2 SATURATION VENOUS: 63.4 % (ref 40.0–85.0)
OXYHEMOGLOBIN, VENOUS: 68.3 % (ref 40.0–85.0)
OXYHEMOGLOBIN, VENOUS: 61.9 % (ref 40.0–85.0)
PCO2 VENOUS: 49 mmHg (ref 40–60)
PCO2 VENOUS: 43 mmHg (ref 40–60)
PH VENOUS: 7.39 (ref 7.32–7.43)
PH VENOUS: 7.39 (ref 7.32–7.43)
PO2 VENOUS: 41 mmHg (ref 30–55)
PO2 VENOUS: 37 mmHg (ref 30–55)

## 2024-04-15 LAB — BLOOD GAS CRITICAL CARE PANEL, VENOUS
BASE EXCESS VENOUS: 2 (ref -2.0–2.0)
CALCIUM IONIZED VENOUS (MG/DL): 4.89 mg/dL (ref 4.40–5.40)
CARBOXYHEMOGLOBIN, VENOUS: 2 % — ABNORMAL HIGH (ref <1.2)
CHLORIDE, WHOLE BLOOD: 113 mmol/L — ABNORMAL HIGH (ref 98–107)
FIO2 VENOUS: 60
GLUCOSE WHOLE BLOOD: 156 mg/dL (ref 70–179)
HCO3 VENOUS: 27 mmol/L (ref 22–27)
HEMOGLOBIN BLOOD GAS: 8.9 g/dL — ABNORMAL LOW (ref 12.00–16.00)
LACTATE BLOOD VENOUS: 1.5 mmol/L (ref 0.5–1.8)
METHEMOGLOBIN, VENOUS: <1 % (ref <1.5)
O2 SATURATION VENOUS: 70.3 % (ref 40.0–85.0)
OXYHEMOGLOBIN, VENOUS: 68.4 % (ref 40.0–85.0)
PCO2 VENOUS: 46 mmHg (ref 40–60)
PH VENOUS: 7.38 (ref 7.32–7.43)
PO2 VENOUS: 41 mmHg (ref 30–55)
POTASSIUM WHOLE BLOOD: 4.7 mmol/L — ABNORMAL HIGH (ref 3.4–4.6)
SODIUM WHOLE BLOOD: 143 mmol/L (ref 135–145)

## 2024-04-15 LAB — CBC W/ AUTO DIFF
BASOPHILS ABSOLUTE COUNT: 0 10*9/L (ref 0.0–0.1)
BASOPHILS RELATIVE PERCENT: 1 %
EOSINOPHILS ABSOLUTE COUNT: 0.2 10*9/L (ref 0.0–0.5)
EOSINOPHILS RELATIVE PERCENT: 4 %
HEMATOCRIT: 25.7 % — ABNORMAL LOW (ref 34.0–44.0)
HEMOGLOBIN: 8.5 g/dL — ABNORMAL LOW (ref 11.3–14.9)
LYMPHOCYTES ABSOLUTE COUNT: 1 10*9/L — ABNORMAL LOW (ref 1.1–3.6)
LYMPHOCYTES RELATIVE PERCENT: 22.8 %
MEAN CORPUSCULAR HEMOGLOBIN CONC: 33.1 g/dL (ref 32.0–36.0)
MEAN CORPUSCULAR HEMOGLOBIN: 32.8 pg — ABNORMAL HIGH (ref 25.9–32.4)
MEAN CORPUSCULAR VOLUME: 99 fL — ABNORMAL HIGH (ref 77.6–95.7)
MEAN PLATELET VOLUME: 8.8 fL (ref 6.8–10.7)
MONOCYTES ABSOLUTE COUNT: 0.5 10*9/L (ref 0.3–0.8)
MONOCYTES RELATIVE PERCENT: 11.9 %
NEUTROPHILS ABSOLUTE COUNT: 2.5 10*9/L (ref 1.8–7.8)
NEUTROPHILS RELATIVE PERCENT: 60.3 %
PLATELET COUNT: 126 10*9/L — ABNORMAL LOW (ref 150–450)
RED BLOOD CELL COUNT: 2.59 10*12/L — ABNORMAL LOW (ref 3.95–5.13)
RED CELL DISTRIBUTION WIDTH: 20.4 % — ABNORMAL HIGH (ref 12.2–15.2)
WBC ADJUSTED: 4.2 10*9/L (ref 3.6–11.2)

## 2024-04-15 LAB — BASIC METABOLIC PANEL
ANION GAP: 9 mmol/L (ref 5–14)
BLOOD UREA NITROGEN: 12 mg/dL (ref 9–23)
BUN / CREAT RATIO: 27
CALCIUM: 8.5 mg/dL — ABNORMAL LOW (ref 8.7–10.4)
CHLORIDE: 108 mmol/L — ABNORMAL HIGH (ref 98–107)
CO2: 29 mmol/L (ref 20.0–31.0)
CREATININE: 0.44 mg/dL — ABNORMAL LOW (ref 0.55–1.02)
EGFR CKD-EPI (2021) FEMALE: >90 mL/min/1.73m2 (ref >=60)
GLUCOSE RANDOM: 108 mg/dL (ref 70–179)
POTASSIUM: 4.2 mmol/L (ref 3.4–4.8)
SODIUM: 146 mmol/L — ABNORMAL HIGH (ref 135–145)

## 2024-04-15 LAB — HEPATIC FUNCTION PANEL
ALBUMIN: 3.4 g/dL (ref 3.4–5.0)
ALKALINE PHOSPHATASE: 147 U/L — ABNORMAL HIGH (ref 46–116)
ALT (SGPT): 10 U/L (ref 10–49)
AST (SGOT): 25 U/L (ref <=34)
BILIRUBIN DIRECT: 0.6 mg/dL — ABNORMAL HIGH (ref 0.00–0.30)
BILIRUBIN TOTAL: 1 mg/dL (ref 0.3–1.2)
PROTEIN TOTAL: 5.8 g/dL (ref 5.7–8.2)

## 2024-04-15 LAB — APTT
APTT: 30.8 s (ref 24.8–38.4)
HEPARIN CORRELATION: 0.2

## 2024-04-15 LAB — CYSTATIN C
CYSTATIN C: 1.61 mg/L — ABNORMAL HIGH (ref 0.64–1.23)
EGFR CKD-EPI (2012) CYSTATIN C FEMALE: 36 mL/min/1.73m2 — ABNORMAL LOW (ref >=60)

## 2024-04-15 LAB — MAGNESIUM: MAGNESIUM: 2.1 mg/dL (ref 1.6–2.6)

## 2024-04-15 LAB — PHOSPHORUS: PHOSPHORUS: 2.3 mg/dL — ABNORMAL LOW (ref 2.4–5.1)

## 2024-04-15 LAB — SLIDE REVIEW

## 2024-04-15 MED ADMIN — sodium chloride (NS) 0.9 % flush 10 mL: 10 mL | INTRAVENOUS

## 2024-04-15 MED ADMIN — rifAXIMin (XIFAXAN) oral suspension: 550 mg | GASTROENTERAL | @ 13:00:00 | Stop: 2025-04-11

## 2024-04-15 MED ADMIN — rifAXIMin (XIFAXAN) oral suspension: 550 mg | GASTROENTERAL | @ 02:00:00 | Stop: 2025-04-11

## 2024-04-15 MED ADMIN — Propofol (DIPRIVAN) injection: INTRAVENOUS | @ 15:00:00 | Stop: 2024-04-15

## 2024-04-15 MED ADMIN — Propofol (DIPRIVAN) injection: INTRAVENOUS | @ 16:00:00 | Stop: 2024-04-15

## 2024-04-15 MED ADMIN — folic acid (FOLVITE) tablet 1 mg: 1 mg | GASTROENTERAL | @ 13:00:00

## 2024-04-15 MED ADMIN — metoPROLOL tartrate (Lopressor) tablet 25 mg: 25 mg | GASTROENTERAL | @ 02:00:00

## 2024-04-15 MED ADMIN — metoPROLOL tartrate (Lopressor) tablet 25 mg: 25 mg | GASTROENTERAL | @ 13:00:00

## 2024-04-15 MED ADMIN — thiamine mononitrate (vit B1) tablet 100 mg: 100 mg | GASTROENTERAL | @ 13:00:00

## 2024-04-15 MED ADMIN — cyanocobalamin (vitamin B-12) tablet 1,000 mcg: 1000 ug | GASTROENTERAL | @ 13:00:00

## 2024-04-15 MED ADMIN — lactulose oral solution: 20 g | GASTROENTERAL | @ 20:00:00

## 2024-04-15 MED ADMIN — lactulose oral solution: 20 g | GASTROENTERAL | @ 13:00:00

## 2024-04-15 MED ADMIN — esomeprazole (NEXIUM) granules 40 mg: 40 mg | GASTROENTERAL | @ 13:00:00

## 2024-04-15 MED ADMIN — multivitamins, therapeutic with minerals tablet 1 tablet: 1 | GASTROENTERAL | @ 13:00:00

## 2024-04-15 MED ADMIN — EPINEPHrine 100 mcg/10 mL (10 mcg/mL) injection: INTRAVENOUS | @ 15:00:00 | Stop: 2024-04-15

## 2024-04-15 MED ADMIN — EPINEPHrine 100 mcg/10 mL (10 mcg/mL) injection: INTRAVENOUS | @ 16:00:00 | Stop: 2024-04-15

## 2024-04-15 MED ADMIN — midazolam (VERSED) injection: INTRAVENOUS | @ 15:00:00 | Stop: 2024-04-15

## 2024-04-15 MED ADMIN — potassium chloride (KLOR-CON) packet 40 mEq: 40 meq | GASTROENTERAL | @ 13:00:00 | Stop: 2024-04-15

## 2024-04-15 MED ADMIN — potassium chloride (KLOR-CON) packet 40 mEq: 40 meq | GASTROENTERAL | @ 07:00:00 | Stop: 2024-04-15

## 2024-04-15 MED ADMIN — melatonin tablet 3 mg: 3 mg | ORAL

## 2024-04-15 MED ADMIN — hydrocortisone sod succ (Solu-CORTEF) injection: INTRAVENOUS | @ 15:00:00 | Stop: 2024-04-15

## 2024-04-15 MED ADMIN — heparin 25,000 Units/250 mL (100 units/mL) in 0.45% saline infusion (premade): 0-24 [IU]/kg/h | INTRAVENOUS | @ 03:00:00

## 2024-04-15 MED ADMIN — famotidine (PEPCID) injection: INTRAVENOUS | @ 16:00:00 | Stop: 2024-04-15

## 2024-04-15 MED ADMIN — sugammadex (BRIDION) injection: INTRAVENOUS | @ 17:00:00 | Stop: 2024-04-15

## 2024-04-15 MED ADMIN — lidocaine-EPINEPHrine (XYLOCAINE W/EPI) 1 %-1:100,000 injection: @ 15:00:00 | Stop: 2024-04-15

## 2024-04-15 MED ADMIN — ceFAZolin in 50ml iso-osmotic dextrose (ANCEF) 2 gram/50 mL IVPB: INTRAVENOUS | @ 16:00:00 | Stop: 2024-04-15

## 2024-04-15 MED ADMIN — diphenhydrAMINE (BENADRYL) injection: INTRAVENOUS | @ 15:00:00 | Stop: 2024-04-15

## 2024-04-15 MED ADMIN — NORepinephrine 8 mg in dextrose 5 % 250 mL (32 mcg/mL) infusion PMB: INTRAVENOUS | @ 16:00:00 | Stop: 2024-04-15

## 2024-04-15 MED ADMIN — lactated Ringers infusion: INTRAVENOUS | @ 15:00:00 | Stop: 2024-04-15

## 2024-04-15 MED ADMIN — lactated ringers bolus 1,000 mL: 1000 mL | INTRAVENOUS | @ 18:00:00 | Stop: 2024-04-15

## 2024-04-15 MED ADMIN — ROCuronium (ZEMURON) injection: INTRAVENOUS | @ 15:00:00 | Stop: 2024-04-15

## 2024-04-15 MED ADMIN — phenylephrine 1 mg/10 mL (100 mcg/mL) injection Syrg: INTRAVENOUS | @ 15:00:00 | Stop: 2024-04-15

## 2024-04-15 NOTE — Op Note (Signed)
 Otolaryngology Operative Report    Preoperative Diagnosis:   Respiratory failure  Ventilator dependence    Post operative Diagnosis:   same    Procedure(s) Performed:  1. Tracheostomy, CPT 31600    Teaching Surgeon:  Juliene Rise, M.D., Ph.D.; Ike Never, M.D., Ph.D.    Assistant Surgeon: True Gammons, M.D.    Anesthesia: General    Specimens:  None     Estimated Blood Loss: 15cc    Complications:   None    Operative Findings:    1. 6 CFD Shiley tracheostomy placed.   2. Inferior Bjork flap sutured in place.   3. Trach secured with 4 quadrant sutures and a dale soft collar.   4. Surgicel placed into stoma    Procedure: The patient was appropriately identified in the preoperative holding area.  Risks, benefits and alternatives were reviewed with the patient, who agreed to proceed and signed consent.    The patient was subsequently transferred back to the operating room by Anesthesia staff and placed in supine position on the operating table.  Following first pre-procedural timeout, including patient and procedure verification, general anesthesia was induced without complication via existing endotracheal tube and adequate ventilation was confirmed.     Patient was then turned over to the surgical team.  The laryngeal landmarks and sternal notch were marked.  A midline 4 cm skin incision was planned 2 fingerbreadths above the sternal notch.  This was injected with 1% lidocaine  with 1:100,000 epinephrine  - 3 cc.  Patient was prepped and draped in standard fashion.  Second pre-procedural timeout was conducted.    Incision was made with a 15 blade scalpel through skin and subcutaneous tissue and through the platysma.  The midline raphe was identified and the strap muscles lateralized from cricoid to the sternal notch to expose the thyroid isthmus and airway.  The thyroid isthmus was divided meticulously with electrocautery, the lobes lateralized and the trachea exposed.     The tracheotomy was then made with a 15 blade scalpel, and widened with a curved mayo scissor.  An inferior Bjork flap was then cut sharply with curved mayo scissors, and secured to the skin with a 3-0 vicryl suture. A 6 CFD Shiley tracheostomy tube was inserted into the airway, circuit connected and adequate ventilation was confirmed.  The tube was secured to the neck with four quadrant 2-0 silk sutures and a dale soft collar was applied.    Patient was turned back over to anesthesia for transport    Teaching Surgeon Attestation: Dr. Never was present and participated throughout the entire procedure

## 2024-04-15 NOTE — Treatment Plan (Signed)
 Otolaryngology- Head and Neck Surgery Treatment Plan:     78 year old F with history of HFpEF, HTN, Afib on AC, MASLD cirrhosis, originally hospitalized for scheduled TEE/DCCV complicated by chronic respiratory failure requiring prolonged intubation. Now s/p tracheostomy 11/14.    - Ok to restart heparin gtt tomorrow  - Will plan to remove surgicel POD1 - POD3  - Keep obturator at head of bed. Please keep extra 6 cuffed, 4 cuffed, 6 cuffless, and 4 cuffless tracheostomy tubes at bedside.  - Suction trach as ordered.  - Please keep mepilex under inferior flange of trach to prevent wound breakdown from pressure injury.   - Trach secured with 4 quadrant sutures and soft collar. Please do not cut trach ties or sutures.  - ENT to perform first trach change. Anticipate POD 5-7 or after weaned off ventilator.   - Wean from ventilator per primary team. Please involve the trach team and respiratory therapy in tracheostomy care.   - Please teach patient and family trach care when appropriate.  - ENT will continue to follow. Please page with questions or concerns.

## 2024-04-15 NOTE — Plan of Care (Signed)
 Pt has been drowsy this shift. Briefly on levo following trach placement in OR this morning. Now off. VSS, afebrile. Sats WNL on 40%. TF restarted this afternoon via corpak. Purewick in place. FMS, on lactulose . Falls precautions maintained. Q2hr turns.    Problem: Skin Injury Risk Increased  Goal: Skin Health and Integrity  Outcome: Shift Focus  Intervention: Optimize Skin Protection  Recent Flowsheet Documentation  Taken 04/15/2024 1600 by Sebastian Rollene HERO, RN  Pressure Reduction Techniques: heels elevated off bed  Head of Bed (HOB) Positioning: HOB at 30 degrees  Pressure Reduction Devices: heel offloading device utilized  Taken 04/15/2024 1400 by Sebastian Rollene HERO, RN  Pressure Reduction Techniques: heels elevated off bed  Head of Bed (HOB) Positioning: HOB at 30 degrees  Pressure Reduction Devices: heel offloading device utilized  Taken 04/15/2024 1215 by Sebastian Rollene HERO, RN  Pressure Reduction Techniques: heels elevated off bed  Head of Bed [facility]) Positioning: HOB at 30-45 degrees  Taken 04/15/2024 0800 by Sebastian Rollene HERO, RN  Pressure Reduction Techniques: heels elevated off bed  Head of Bed (HOB) Positioning: HOB at 30 degrees     Problem: Adult Inpatient Plan of Care  Goal: Optimal Comfort and Wellbeing  Outcome: Shift Focus     Problem: Fall Injury Risk  Goal: Absence of Fall and Fall-Related Injury  Outcome: Shift Focus  Intervention: Promote Injury-Free Environment  Recent Flowsheet Documentation  Taken 04/15/2024 0800 by Sebastian Rollene HERO, RN  Safety Interventions:   low bed   fall reduction program maintained     Problem: Gas Exchange Impaired  Goal: Optimal Gas Exchange  Outcome: Shift Focus  Intervention: Optimize Oxygenation and Ventilation  Recent Flowsheet Documentation  Taken 04/15/2024 1600 by Sebastian Rollene HERO, RN  Head of Bed [facility]) Positioning: HOB at 30 degrees  Taken 04/15/2024 1400 by Sebastian Rollene HERO, RN  Head of Bed [facility]) Positioning: HOB at 30 degrees  Taken 04/15/2024 1215 by Sebastian Rollene HERO, RN  Head of Bed [facility]) Positioning: HOB at 30-45 degrees  Taken 04/15/2024 0800 by Sebastian Rollene HERO, RN  Head of Bed [facility]) Positioning: HOB at 30 degrees     Problem: Confusion Acute  Goal: Optimal Cognitive Function  Outcome: Shift Focus

## 2024-04-15 NOTE — Plan of Care (Signed)
 Patient rested overnight on documented vent settings. Airway is patent and secure. Will continue to monitor.    Problem: Mechanical Ventilation Invasive  Goal: Optimal Device Function  Outcome: Ongoing - Unchanged  Intervention: Optimize Device Care and Function  Recent Flowsheet Documentation  Taken 04/14/2024 2039 by Jullie Birmingham, RRT  Oral Care:   mouth swabbed   oral rinse provided   suction provided   teeth brushed   tongue brushed     Problem: Artificial Airway  Goal: Optimal Device Function  Outcome: Ongoing - Unchanged  Intervention: Optimize Device Care and Function  Recent Flowsheet Documentation  Taken 04/14/2024 2039 by Jullie Birmingham, RRT  Oral Care:   mouth swabbed   oral rinse provided   suction provided   teeth brushed   tongue brushed

## 2024-04-15 NOTE — Progress Notes (Signed)
 Pt back from OR, trach is secure and patent, sutured. Pt back on vent  pulling good vols. Sat's 100% with good wave form. No distress noted

## 2024-04-15 NOTE — Plan of Care (Signed)
 Pt went to OR for trach placement. Returned with #6 shiley cuffed. Placed back on ventilator. Remains on PRVC 320/ 14/ 5 / 30%. No adverse affects noted. All emergency equipment bedside. Will continue to monitor.    Problem: Mechanical Ventilation Invasive  Goal: Optimal Device Function  Intervention: Optimize Device Care and Function  Recent Flowsheet Documentation  Taken 04/15/2024 1503 by Heron Connors, RRT  Airway/Ventilation Management:   airway patency maintained   humidification applied   pulmonary hygiene promoted  Taken 04/15/2024 0918 by Heron Connors, RRT  Airway/Ventilation Management:   airway patency maintained   humidification applied   pulmonary hygiene promoted  Oral Care:   mouth swabbed   oral rinse provided   suction provided   teeth brushed   tongue brushed  Goal: Absence of Ventilator-Induced Lung Injury  Intervention: Prevent Ventilator-Associated Pneumonia  Recent Flowsheet Documentation  Taken 04/15/2024 1503 by Heron Connors, RRT  Head of Bed [facility]) Positioning: HOB at 30-45 degrees  VAP Prevention Bundle:   HOB elevation maintained   oral care regularly provided   vent circuit breaks minimized  Taken 04/15/2024 0918 by Heron, Damoni Causby, RRT  Head of Bed [facility]) Positioning: HOB at 30-45 degrees  VAP Prevention Bundle:   HOB elevation maintained   oral care regularly provided   vent circuit breaks minimized  Oral Care:   mouth swabbed   oral rinse provided   suction provided   teeth brushed   tongue brushed     Problem: Artificial Airway  Goal: Effective Communication  Outcome: Ongoing - Unchanged  Goal: Optimal Device Function  Outcome: Ongoing - Unchanged  Intervention: Optimize Device Care and Function  Recent Flowsheet Documentation  Taken 04/15/2024 1503 by Heron Connors, RRT  Airway/Ventilation Management:   airway patency maintained   humidification applied   pulmonary hygiene promoted  Taken 04/15/2024 0918 by Heron Connors, RRT  Airway/Ventilation Management:   airway patency maintained   humidification applied   pulmonary hygiene promoted  Oral Care:   mouth swabbed   oral rinse provided   suction provided   teeth brushed   tongue brushed  Goal: Absence of Device-Related Skin or Tissue Injury  Outcome: Ongoing - Unchanged

## 2024-04-15 NOTE — Progress Notes (Signed)
 MICU Daily Progress Note     Date of Service: 04/16/2024    Problem List:   Principal Problem:    Bacteremia, escherichia coli  Active Problems:    Alcoholic liver disease (HHS-HCC)    HTN (hypertension)    Depression with anxiety    Class 2 severe obesity with serious comorbidity in adult    Atrial fibrillation    (CMS-HCC)    Pleural effusion    Hypernatremia    Thrombocytopenia    Cirrhosis    (CMS-HCC)    A-fib (CMS-HCC)    Hepatic encephalopathy    (CMS-HCC)    Anaphylaxis      Interval history: [name] is a 78 y.o. female with HFpEF, HTN, Afib on AC, MASLD cirrhosis, originally hospitalized for scheduled TEE/DCCV (aborted d/t LAA clot) c/b AHHRF, encephalopathy and septic shock requiring MICU care, was s/p extubation transferred to [facility] for further management of anticoagulation and hypernatremia. Transferred back to [facility] MICU for closer monitoring for respiratory status, pressor requirement, GI bleed, and management of carotid artery injury.     On 10/24, patient had acute worsening of respiratory status leading requiring intubation. When patient was given sedation for intubation, she coded and promptly had ROSC after a few minutes of CPR. Subsequently, CVC placed in neck, used to draw blood, unclear if given pressors through it, then found  to be in arterial system based on blood gas and imaging, likely right carotid. Line was subsequently removed, and pressure was held for 15 minutes, hemostasis with no hematoma. She was transferred to [facility] MICU for management of this carotid injury, to be evaluated by vascular surgery while also managing her increased vasopressor requirement and respiratory distress.  Has been weaned off pressors since 10/27 at 1 PM. VBG's indicating that she is ready for extubation, however her mentation suggests that she is not able to protect her airway.  She was extubated on 10/31, and mentation slowly improved.  Unfortunately on the morning of 11/2 she had increased lethargy, increased secretions, and she was not protecting her airway.  She was reintubated on 11/2, and required pressor support with norepinephrine and vasopressin after the induction agents.  Self extubated on 11/5. Re intubated on 11/6 due to hypercarbia and increased work of breathing.  We are left with the challenging options regarding the care of [name].  Due to her multiple failed extubations, we are concerned [name] will need a tracheostomy long-term to protect her airway.  There is a meeting with palliative care and family on 04/13/2024, her family agreed for tracheostomy.  Tracheostomy placed 11/14.       Neurological   Hepatic encephalopathy  Per chart review at [facility], rapid response called on 10/24 for increased somnolence. At the time, differential included hepatic encephalopathy, toxic metabolic encephalopathy in the setting of shock (possibly sepsis), versus intracranial pathology. She was subsequently intubated. CT head on 10/24 showed no acute intracranial abnormality. Weaned off sedation with slow improvements in neurological status.  While we have continued lactulose  therapy and seeing good response with several bowel movements, her mentation remains poor.  This is concerning for severe ICU delirium.  Meeting was had with daughter, [name], on 11/5.  Described that should intubation be necessary we would likely be moving towards a tracheostomy as [name] has continuously not been protecting her airway.  As of now [name] wanted to pursue aggressive measures including repeat intubation and potential tracheostomy. Tracheostomy placed 11/14.    -Continue lactulose  and rifaximin    -  continues to follow commands    Analgesia: No pain issues  RASS at goal? N/A, not on sedation  [place] Agitation Assessment Scale (RASS) : -2 (04/16/2024  2:00 PM)    Pulmonary   Acute Hypoxic Hypercarbic Respiratory Failure   Rapid response called on 10/24 for increased somnolence, found to have acute hypercarbic respiratory failure and subsequently intubated. Suspect hypercarbic respiratory failure due to encephalopathy as above. CXR on 10/24 showed worsening pleural effusions. Not currently getting diuresed. Given patient's hypoxia, and AMS, there was concern for infectious etiology. MRSA nares negative. Infectious workup thus far is unremarkable. SBTs show readiness extubation based on VBGs, however there is concern that she continues to be unable to protect her airway due to her current mentation.  Reintubated on 11/2 followed by therapeutic bronchoscopy.  Self extubated on 11/5.  Reintubate noted on 11/6 due to hypercarbia and not protecting her airway. Trach placed 11/14, complicated by anaphylactic reaction to rocuronium which was treated with epi, benadryl and steroids. Allergy added to chart.    -Monitor on pressure support, if she tolerates can transfer to step down to be weaned off vent    Vent Mode: PSV-CPAP  FiO2 (%): 30 %  S RR: 14  S VT: 320 mL  PEEP: 5 cm H20  PR SUP: 16 cm H20      Cardiovascular   Carotid Artery Injury (resolved)  At [facility], CVC placed intended for RIJ on 10/24 following intubation, PEA, ROSC. Was used with known blood return. Unclear if it was used for pressor support, but highly likely that it was. CXR for placement check showed concern for intra aterial location, and blood gas confirmed it was arterial. Thought to be in carotid artery. CVC removed with pressure held for 15 minutes, with no hematoma formation. On arrival, no physical exam and bedside ultrasound showed no large hematoma. No active bleeding.   - Vascular Surgery consulted, stated fistula has healed based on Ultrasound findings    PEA arrest s/p ROSC  Hypovolemic/Hemorrhagic shock (resolved)  Atrial fibrillation  known left atrial appendage clot  HFpEF  Patient with PEA arrest peri-intubation with ROSC. Shock thought to be hemorrhagic in the setting of GI bleed History of atrial fibrillation with known LAA clot and HFpEF. Vasopressors were weaned off 10/27. APTT was elevated the morning of 10/28 necessitating changing anticoagulation from therapeutic heparin to therapeutic Lovenox .   - Holding Coreg   - Started heparin drip for anticoagulation for LAA clot  - Continue metoprolol  -hemodynamically stable without pressors    Renal   Hypernatremia   At [facility], had persistently hypernatremia near 153. Had been treated with D5 gtt. Sodium throughout admission has largely been 146-148. Sodium was 151 morning of 10/28, presume this will decrease once we start tube feeds.  Sodium has overall normalized and continues to be within normal limits.  - Strict I/O's  - Sodium checks  -Gave a dose of lasix for concerns for fluid overload given bilateral lower extremity pitting edema  -increasing tube flushes to 150 mL q4    Infectious Disease/Autoimmune   E. Coli Bacteremia (resolved),   Initially admitted for afib, found to have E. coli bacteremia on October 10. This was treated with cefazolin . Rapid response called 10/24, patient noted to be hypothermic. WBCs jumped from 3.0 to 9.7 on 10/24. Although the most recent lab was following intubation and CPR. UA with pyuria, rare bacteria, hematuria. Peri-intubation, patient developed significant hypotension requiring initiation of NE and vasopressin. Suspect possible septic  shock with unknown infectious source. Patient started on vancomycin and cefepime 10/24. CT Abdomen pelvis concerning for aspiration pneumonia. Following infectious workup was unremarkable. Antibiotics were stopped on 10/27.  Infectious workup was repeated on 11/3 due to reintubation and status post bronchoscopy.    -Finished course of ceftriaxone    Cultures:  Blood Culture, Routine (no units)   Date Value   04/09/2024 No Growth at 5 days   04/09/2024 No Growth at 5 days     Urine Culture, Comprehensive (no units)   Date Value   04/09/2024 NO GROWTH   04/04/2024 NO GROWTH     Lower Respiratory Culture (no units)   Date Value 04/10/2024 2+ Methicillin-Susceptible Staphylococcus aureus (A)   04/10/2024 1+ Oropharyngeal Flora Isolated     WBC (10*9/L)   Date Value   04/16/2024 6.5     WBC, UA (/HPF)   Date Value   04/10/2024 7 (H)     TT: no abx    FEN/GI   Sigmoid Mass c/f Malignancy and Hematochezia (resolved)  Hemorrhagic Shock (resolved)  CT abdomen pelvis done 10/24 with evidence of cirrhosis and masslike thickening of the lower sigmoid colon concerning for malignancy.  CTA abdomen pelvis performed 1024 with active extravasation into the gastric fundus possibly secondary to traumatic enteric tube placement.  GI scoped the patient on 10/25 and clipped an area of active bleeding.  They removed a total of 1.5 L of blood.  Hemoglobin 10/20 6 AM downtrended to 6.0, so 2 units of blood transfused.  GI stated this was consistent with 10/25 scope and likely did not represent increased bleeding. Hb steadily improving with Hb of 9.2 on 10/28.   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 Given shock of unclear etiology. CTAP demonstrating cirrhosis. Likely that cirrhosis is contributing to hypotension.   - Continue lactulose ; target 3-5 BMs daily  - NGT in place, on TF and FWF  - Daily MELD labs and CMP     MELD 3.0: 15 at 04/05/2024  4:31 AM  MELD-Na: 13 at 04/05/2024  4:31 AM  Calculated from:  Serum Creatinine: 0.43 mg/dL (Using min of 1 mg/dL) at 88/09/7972  5:68 AM  Serum Sodium: 143 mmol/L (Using max of 137 mmol/L) at 04/05/2024  4:31 AM  Total Bilirubin: 1.3 mg/dL at 88/09/7972  5:68 AM  Serum Albumin: 2.4 g/dL at 88/09/7972  5:68 AM  INR(ratio): 1.61 at 04/03/2024 10:26 AM  Age at listing (hypothetical): 78 years  Sex: Female at 04/05/2024  4:31 AM    Provider Malnutrition Assessment:  Body mass index is 38.77 kg/m??. BMI Interpretation: >/= 30 and < 40, consistent with obesity, clinically significant requiring additional resources and complicating multiple aspects of patient care.  GLIM criteria:   Pt does not meet criteria  -I have screened this patient for malnutrition and they did NOT meet criteria for malnutrition based on GLIM criteria.  -TF and FWF; Dietician consulted, appreciate assistance    Heme/Coag   Acute blood loss anemia  Hemoglobin down trended to 6.0 on the morning of 10/26 thought to be secondary to acute GI bleed that was scoped and clipped 10/25.    -stable H/H  CTM      Endocrine   History of R HR+/HER2 low (2+) IDC Breast Cancer s/p Partial Mastectomy and Radiation (  2022)   - Continue home anastrozole     Integumentary   NAI   #  - WOCN consulted for high risk skin assessment No. Reason: Not indicated.    Prophylaxis/LDA/Restraints/Consults   ICU Checklist completed: yes (see ICU rounding navigator in Epic)    Patient Lines/Drains/Airways Status       Active Active Lines, Drains, & Airways       Name Placement date Placement time Site Days    Tracheostomy Shiley 6 Cuffed 04/15/24  1133  6  1    NG/OG Tube Feedings 10 Fr. Right nostril 04/01/24  1138  Right nostril  15    External Urinary Device 04/13/24 With Suction 04/13/24  1700  -- 2    PICC Double Lumen 04/06/24 Left Basilic 04/06/24  1558  Basilic  10                  Patient Lines/Drains/Airways Status       Active Wounds       Name Placement date Placement time Site Days    Wound 03/01/24 Other (Comment) Toe (Comment which one) Left;Other (Comment) Blister present on arrival, tx already in outpatient setting, in process of healing, surrounding erythema 03/01/24  0129  Toe (Comment which one)  46    Wound 03/13/24 Irritant Contact Dermatitis Incontinence Sacrum Mid gluteal cleft MASD? 03/13/24  --  Sacrum  34    Wound 04/15/24 Surgical Neck tracheostomy secured with sutures and trach collar ( surgicel applied 04/15/24  1144  Neck  1                  Goals of Care     Code Status: Orders Placed This Encounter   Procedures    Full Code     Standing Status:   Standing     Number of Occurrences:   1        Designated Healthcare Decision Maker:  Ms. Norlander designated healthcare decision maker(s) is/are   HCDM (patient stated preference): Ellenbecker,[name] - Spouse - [phone]    HCDM (patient stated preference): Mobley,[name] - Daughter - [phone]. See HCDM section of Epic sidebar/storyboard or ACP tab in patient chart for details regarding active HCDMs and patient capacity for decision-making.      Subjective     Patient intubated this morning on my exam. Follows commands and nods head yes and no. Denies pain.     Objective     Vitals - past 24 hours  Temp:  [36.7 ??C (98 ??F)-37.7 ??C (99.9 ??F)] 37.6 ??C (99.7 ??F)  Pulse:  [84-115] 96  SpO2 Pulse:  [84-113] 97  Resp:  [16-41] 29  BP: (99-145)/(38-107) 135/107  FiO2 (%):  [30 %] 30 %  SpO2:  [96 %-100 %] 98 % Intake/Output  I/O last 3 completed shifts:  In: 4296.5 [I.V.:251.5; NG/GT:3045; IV Piggyback:1000]  Out: 1555 [Urine:1000; Stool:550; Blood:5]     Physical Exam:    General: intubated on my examination. Trach in place. Arouses to voice, answers yes/no questions  HEENT: normocephalic, atraumatic   CV: pulses palpable, regular borderline tachycardia   Pulm: Rhonchi w transmitted upper airway sounds throughout  GI: soft, NTND, + BS  MSK: Bilateral LE pitting edema  Skin: numerous large flat violaceous ecchymosis on left arm, chest, neck bilaterally.   Neuro: Withdrawals to painful stimuli. Responding to name and reliably following commands.     Continuous Infusions:   Infusions Meds[1]    Scheduled Medications:   Scheduled Medications[2]  PRN medications:  PRN Medications[3]    Data/Imaging Review: Reviewed in Epic and personally interpreted on 04/16/2024. See EMR for detailed results.      Waddell Mead, DO  PGY1 Emergency Medicine, [facility]                   [1]    [Provider Hold] heparin Stopped (04/15/24 0201)   [2] cyanocobalamin (vitamin B-12)  1,000 mcg Enteral tube: gastric Daily    esomeprazole  40 mg Enteral tube: gastric Daily    flu vac 2025 [id number](PF)  0.5 mL Intramuscular During hospitalization    folic acid   1 mg Enteral tube: gastric Daily    lactulose   20 g Enteral tube: gastric TID    melatonin  3 mg Oral QPM    metoPROLOL tartrate  25 mg Enteral tube: gastric BID    multivitamins (ADULT)  1 tablet Enteral tube: gastric Daily    rifAXIMin   550 mg Enteral tube: gastric BID    sodium chloride   10 mL Intravenous Q8H    sodium chloride   10 mL Intravenous Q8H    thiamine  mononitrate (vit B1)  100 mg Enteral tube: gastric Daily   [3] acetaminophen , docusate sodium, fentaNYL  (PF), haloperidol LACTATE **OR** [DISCONTINUED] haloperidol LACTATE, heparin (porcine)

## 2024-04-15 NOTE — Plan of Care (Signed)
 Pt drowsy this shift, arouses to verbal stimulation. Follows some commands, PERRLA. Remains on vent. Hep gtt stopped at 0200 for procedure today. TF stopped at MN. Purewick, voiding well. FMS in place, minimal OP. Family updated by phone.      Problem: Skin Injury Risk Increased  Goal: Skin Health and Integrity  Intervention: Optimize Skin Protection  Recent Flowsheet Documentation  Taken 04/15/2024 0200 by Corinthia Sharper, RN  Head of Bed [facility]) Positioning: HOB at 30 degrees  Taken 04/15/2024 0000 by Corinthia Sharper, RN  Head of Bed [facility]) Positioning: HOB at 30 degrees  Taken 04/14/2024 2200 by Corinthia Sharper, RN  Head of Bed [facility]) Positioning: HOB at 30 degrees  Taken 04/14/2024 2000 by Corinthia Sharper, RN  Pressure Reduction Techniques:   heels elevated off bed   weight shift assistance provided  Head of Bed (HOB) Positioning: HOB at 30 degrees  Pressure Reduction Devices:   heel offloading device utilized   positioning supports utilized   pressure-redistributing mattress utilized  Skin Protection:   adhesive use limited   incontinence pads utilized   tubing/devices free from skin contact     Problem: Adult Inpatient Plan of Care  Goal: Absence of Hospital-Acquired Illness or Injury  Intervention: Identify and Manage Fall Risk  Recent Flowsheet Documentation  Taken 04/14/2024 2000 by Corinthia Sharper, RN  Safety Interventions:   aspiration precautions   fall reduction program maintained   lighting adjusted for tasks/safety  Intervention: Prevent Skin Injury  Recent Flowsheet Documentation  Taken 04/15/2024 0400 by Corinthia Sharper, RN  Positioning for Skin: Left  Taken 04/15/2024 0200 by Corinthia Sharper, RN  Positioning for Skin: Right  Taken 04/15/2024 0000 by Corinthia Sharper, RN  Positioning for Skin: Left  Taken 04/14/2024 2200 by Corinthia Sharper, RN  Positioning for Skin: Right  Taken 04/14/2024 2000 by Corinthia Sharper, RN  Positioning for Skin: Left  Device Skin Pressure Protection:   adhesive use limited   tubing/devices free from skin contact  Skin Protection:   adhesive use limited   incontinence pads utilized   tubing/devices free from skin contact  Intervention: Prevent Infection  Recent Flowsheet Documentation  Taken 04/14/2024 2000 by Corinthia Sharper, RN  Infection Prevention:   hand hygiene promoted   single patient room provided     Problem: Breathing Pattern Ineffective  Goal: Effective Breathing Pattern  Intervention: Promote Improved Breathing Pattern  Recent Flowsheet Documentation  Taken 04/15/2024 0200 by Corinthia Sharper, RN  Head of Bed [facility]) Positioning: HOB at 30 degrees  Taken 04/15/2024 0000 by Corinthia Sharper, RN  Head of Bed [facility]) Positioning: HOB at 30 degrees  Taken 04/14/2024 2200 by Corinthia Sharper, RN  Head of Bed [facility]) Positioning: HOB at 30 degrees  Taken 04/14/2024 2000 by Corinthia Sharper, RN  Head of Bed [facility]) Positioning: HOB at 30 degrees     Problem: Fall Injury Risk  Goal: Absence of Fall and Fall-Related Injury  Intervention: Promote Injury-Free Environment  Recent Flowsheet Documentation  Taken 04/14/2024 2000 by Corinthia Sharper, RN  Safety Interventions:   aspiration precautions   fall reduction program maintained   lighting adjusted for tasks/safety     Problem: Gas Exchange Impaired  Goal: Optimal Gas Exchange  Intervention: Optimize Oxygenation and Ventilation  Recent Flowsheet Documentation  Taken 04/15/2024 0200 by Corinthia Sharper, RN  Head of Bed [facility]) Positioning: HOB at 30 degrees  Taken 04/15/2024 0000 by Corinthia Sharper, RN  Head of Bed [facility]) Positioning: HOB at 30 degrees  Taken 04/14/2024 2200 by  Corinthia Sharper, RN  Head of Bed [facility]) Positioning: HOB at 30 degrees  Taken 04/14/2024 2000 by Corinthia Sharper, RN  Head of Bed [facility]) Positioning: HOB at 30 degrees     Problem: Non-Violent Restraints  Intervention: Utilize least restrictive measures  Recent Flowsheet Documentation  Taken 04/15/2024 0400 by Corinthia Sharper, RN  Less Restrictive Alternative: 1:1 patient care  Taken 04/15/2024 0200 by Corinthia Sharper, RN  Less Restrictive Alternative: 1:1 patient care  Taken 04/15/2024 0000 by Corinthia Sharper, RN  Less Restrictive Alternative: 1:1 patient care  Taken 04/14/2024 2200 by Corinthia Sharper, RN  Less Restrictive Alternative: 1:1 patient care  Taken 04/14/2024 2000 by Corinthia Sharper, RN  Less Restrictive Alternative: 1:1 patient care  Intervention: Patient Monitoring  Recent Flowsheet Documentation  Taken 04/15/2024 0400 by Corinthia Sharper, RN  Psychological Status/Visual Check: Subdued  Circulation/Skin Integrity: No signs of injury  Range of Motion: Performed  Fluids: NPO  Food/Meal: Enteral feeding/TPN  Elimination: Incontinent/patient changed  Taken 04/15/2024 0200 by Corinthia Sharper, RN  Psychological Status/Visual Check: Subdued  Circulation/Skin Integrity: No signs of injury  Range of Motion: Performed  Fluids: NPO  Food/Meal: Enteral feeding/TPN  Elimination: Incontinent/patient changed  Taken 04/15/2024 0000 by Corinthia Sharper, RN  Psychological Status/Visual Check: Subdued  Circulation/Skin Integrity: No signs of injury  Range of Motion: Performed  Fluids: NPO  Food/Meal: Enteral feeding/TPN  Elimination: Incontinent/patient changed  Taken 04/14/2024 2200 by Corinthia Sharper, RN  Psychological Status/Visual Check: Subdued  Circulation/Skin Integrity: No signs of injury  Range of Motion: Performed  Fluids: NPO  Food/Meal: Enteral feeding/TPN  Elimination: Incontinent/patient changed  Taken 04/14/2024 2000 by Corinthia Sharper, RN  Psychological Status/Visual Check: Subdued  Circulation/Skin Integrity: No signs of injury  Range of Motion: Performed  Fluids: NPO  Food/Meal: Enteral feeding/TPN  Elimination: Incontinent/patient changed     Problem: Wound  Goal: Absence of Infection Signs and Symptoms  Intervention: Prevent or Manage Infection  Recent Flowsheet Documentation  Taken 04/14/2024 2000 by Corinthia Sharper, RN  Infection Management: aseptic technique maintained  Goal: Skin Health and Integrity  Intervention: Optimize Skin Protection  Recent Flowsheet Documentation  Taken 04/15/2024 0200 by Corinthia Sharper, RN  Head of Bed [facility]) Positioning: HOB at 30 degrees  Taken 04/15/2024 0000 by Corinthia Sharper, RN  Head of Bed [facility]) Positioning: HOB at 30 degrees  Taken 04/14/2024 2200 by Corinthia Sharper, RN  Head of Bed [name]) Positioning: HOB at 30 degrees  Taken 04/14/2024 2000 by Corinthia Sharper, RN  Pressure Reduction Techniques:   heels elevated off bed   weight shift assistance provided  Head of Bed (HOB) Positioning: HOB at 30 degrees  Pressure Reduction Devices:   heel offloading device utilized   positioning supports utilized   pressure-redistributing mattress utilized  Skin Protection:   adhesive use limited   incontinence pads utilized   tubing/devices free from skin contact     Problem: Mechanical Ventilation Invasive  Goal: Optimal Device Function  Intervention: Optimize Device Care and Function  Recent Flowsheet Documentation  Taken 04/14/2024 2000 by Corinthia Sharper, RN  Oral Care:   mouth swabbed   suction provided   teeth brushed  Goal: Absence of Device-Related Skin and Tissue Injury  Intervention: Maintain Skin and Tissue Health  Recent Flowsheet Documentation  Taken 04/14/2024 2000 by Corinthia Sharper, RN  Device Skin Pressure Protection:   adhesive use limited   tubing/devices free from skin contact  Goal: Absence of Ventilator-Induced Lung Injury  Intervention: Prevent Ventilator-Associated Pneumonia  Recent Flowsheet Documentation  Taken 04/15/2024 0200 by Corinthia Sharper, RN  Head of Bed [facility]) Positioning: HOB at 30 degrees  Taken 04/15/2024 0000 by Corinthia Sharper, RN  Head of Bed [facility]) Positioning: HOB at 30 degrees  Taken 04/14/2024 2200 by Corinthia Sharper, RN  Head of Bed [facility]) Positioning: HOB at 30 degrees  Taken 04/14/2024 2000 by Corinthia Sharper, RN  Head of Bed [facility]) Positioning: HOB at 30 degrees  Oral Care:   mouth swabbed   suction provided   teeth brushed     Problem: Mechanical Ventilation Invasive  Goal: Optimal Device Function  Intervention: Optimize Device Care and Function  Recent Flowsheet Documentation  Taken 04/14/2024 2000 by Corinthia Sharper, RN  Oral Care:   mouth swabbed   suction provided   teeth brushed  Goal: Absence of Device-Related Skin and Tissue Injury  Intervention: Maintain Skin and Tissue Health  Recent Flowsheet Documentation  Taken 04/14/2024 2000 by Corinthia Sharper, RN  Device Skin Pressure Protection:   adhesive use limited   tubing/devices free from skin contact  Goal: Absence of Ventilator-Induced Lung Injury  Intervention: Prevent Ventilator-Associated Pneumonia  Recent Flowsheet Documentation  Taken 04/15/2024 0200 by Corinthia Sharper, RN  Head of Bed [facility]) Positioning: HOB at 30 degrees  Taken 04/15/2024 0000 by Corinthia Sharper, RN  Head of Bed [facility]) Positioning: HOB at 30 degrees  Taken 04/14/2024 2200 by Corinthia Sharper, RN  Head of Bed [facility]) Positioning: HOB at 30 degrees  Taken 04/14/2024 2000 by Corinthia Sharper, RN  Head of Bed [facility]) Positioning: HOB at 30 degrees  Oral Care:   mouth swabbed   suction provided   teeth brushed     Problem: Mechanical Ventilation Invasive  Goal: Optimal Device Function  Intervention: Optimize Device Care and Function  Recent Flowsheet Documentation  Taken 04/14/2024 2000 by Corinthia Sharper, RN  Oral Care:   mouth swabbed   suction provided   teeth brushed  Goal: Absence of Device-Related Skin and Tissue Injury  Intervention: Maintain Skin and Tissue Health  Recent Flowsheet Documentation  Taken 04/14/2024 2000 by Corinthia Sharper, RN  Device Skin Pressure Protection:   adhesive use limited   tubing/devices free from skin contact  Goal: Absence of Ventilator-Induced Lung Injury  Intervention: Prevent Ventilator-Associated Pneumonia  Recent Flowsheet Documentation  Taken 04/15/2024 0200 by Corinthia Sharper, RN  Head of Bed [facility]) Positioning: HOB at 30 degrees  Taken 04/15/2024 0000 by Corinthia Sharper, RN  Head of Bed [facility]) Positioning: HOB at 30 degrees  Taken 04/14/2024 2200 by Corinthia Sharper, RN  Head of Bed [facility]) Positioning: HOB at 30 degrees  Taken 04/14/2024 2000 by Corinthia Sharper, RN  Head of Bed [facility]) Positioning: HOB at 30 degrees  Oral Care:   mouth swabbed   suction provided   teeth brushed     Problem: Mechanical Ventilation Invasive  Goal: Optimal Device Function  Intervention: Optimize Device Care and Function  Recent Flowsheet Documentation  Taken 04/14/2024 2000 by Corinthia Sharper, RN  Oral Care:   mouth swabbed   suction provided   teeth brushed  Goal: Absence of Device-Related Skin and Tissue Injury  Intervention: Maintain Skin and Tissue Health  Recent Flowsheet Documentation  Taken 04/14/2024 2000 by Corinthia Sharper, RN  Device Skin Pressure Protection:   adhesive use limited   tubing/devices free from skin contact  Goal: Absence of Ventilator-Induced Lung Injury  Intervention: Prevent Ventilator-Associated Pneumonia  Recent Flowsheet Documentation  Taken 04/15/2024 0200 by Corinthia Sharper, RN  Head of Bed [facility]) Positioning: HOB at  30 degrees  Taken 04/15/2024 0000 by Corinthia Sharper, RN  Head of Bed [facility]) Positioning: HOB at 30 degrees  Taken 04/14/2024 2200 by Corinthia Sharper, RN  Head of Bed [facility]) Positioning: HOB at 30 degrees  Taken 04/14/2024 2000 by Corinthia Sharper, RN  Head of Bed [facility]) Positioning: HOB at 30 degrees  Oral Care:   mouth swabbed   suction provided   teeth brushed     Problem: Infection  Goal: Absence of Infection Signs and Symptoms  Intervention: Prevent or Manage Infection  Recent Flowsheet Documentation  Taken 04/14/2024 2000 by Corinthia Sharper, RN  Infection Management: aseptic technique maintained     Problem: Mechanical Ventilation Invasive  Goal: Optimal Device Function  Intervention: Optimize Device Care and Function  Recent Flowsheet Documentation  Taken 04/14/2024 2000 by Corinthia Sharper, RN  Oral Care:   mouth swabbed   suction provided   teeth brushed  Goal: Absence of Device-Related Skin and Tissue Injury  Intervention: Maintain Skin and Tissue Health  Recent Flowsheet Documentation  Taken 04/14/2024 2000 by Corinthia Sharper, RN  Device Skin Pressure Protection:   adhesive use limited   tubing/devices free from skin contact  Goal: Absence of Ventilator-Induced Lung Injury  Intervention: Prevent Ventilator-Associated Pneumonia  Recent Flowsheet Documentation  Taken 04/15/2024 0200 by Corinthia Sharper, RN  Head of Bed [facility]) Positioning: HOB at 30 degrees  Taken 04/15/2024 0000 by Corinthia Sharper, RN  Head of Bed [facility]) Positioning: HOB at 30 degrees  Taken 04/14/2024 2200 by Corinthia Sharper, RN  Head of Bed [facility]) Positioning: HOB at 30 degrees  Taken 04/14/2024 2000 by Corinthia Sharper, RN  Head of Bed [facility]) Positioning: HOB at 30 degrees  Oral Care:   mouth swabbed   suction provided   teeth brushed     Problem: Mechanical Ventilation Invasive  Goal: Optimal Device Function  Intervention: Optimize Device Care and Function  Recent Flowsheet Documentation  Taken 04/14/2024 2000 by Corinthia Sharper, RN  Oral Care:   mouth swabbed   suction provided   teeth brushed  Goal: Absence of Device-Related Skin and Tissue Injury  Intervention: Maintain Skin and Tissue Health  Recent Flowsheet Documentation  Taken 04/14/2024 2000 by Corinthia Sharper, RN  Device Skin Pressure Protection:   adhesive use limited   tubing/devices free from skin contact  Goal: Absence of Ventilator-Induced Lung Injury  Intervention: Prevent Ventilator-Associated Pneumonia  Recent Flowsheet Documentation  Taken 04/15/2024 0200 by Corinthia Sharper, RN  Head of Bed [facility]) Positioning: HOB at 30 degrees  Taken 04/15/2024 0000 by Corinthia Sharper, RN  Head of Bed [facility]) Positioning: HOB at 30 degrees  Taken 04/14/2024 2200 by Corinthia Sharper, RN  Head of Bed [facility]) Positioning: HOB at 30 degrees  Taken 04/14/2024 2000 by Corinthia Sharper, RN  Head of Bed [facility]) Positioning: HOB at 30 degrees  Oral Care:   mouth swabbed   suction provided   teeth brushed     Problem: Mechanical Ventilation Invasive  Goal: Optimal Device Function  Intervention: Optimize Device Care and Function  Recent Flowsheet Documentation  Taken 04/14/2024 2000 by Corinthia Sharper, RN  Oral Care:   mouth swabbed   suction provided   teeth brushed  Goal: Absence of Device-Related Skin and Tissue Injury  Intervention: Maintain Skin and Tissue Health  Recent Flowsheet Documentation  Taken 04/14/2024 2000 by Corinthia Sharper, RN  Device Skin Pressure Protection:   adhesive use limited   tubing/devices free from skin contact  Goal: Absence of Ventilator-Induced Lung Injury  Intervention:  Prevent Ventilator-Associated Pneumonia  Recent Flowsheet Documentation  Taken 04/15/2024 0200 by Corinthia Sharper, RN  Head of Bed [facility]) Positioning: HOB at 30 degrees  Taken 04/15/2024 0000 by Corinthia Sharper, RN  Head of Bed [facility]) Positioning: HOB at 30 degrees  Taken 04/14/2024 2200 by Corinthia Sharper, RN  Head of Bed [facility]) Positioning: HOB at 30 degrees  Taken 04/14/2024 2000 by Corinthia Sharper, RN  Head of Bed [facility]) Positioning: HOB at 30 degrees  Oral Care:   mouth swabbed   suction provided   teeth brushed     Problem: Artificial Airway  Goal: Optimal Device Function  Intervention: Optimize Device Care and Function  Recent Flowsheet Documentation  Taken 04/14/2024 2000 by Corinthia Sharper, RN  Aspiration Precautions:   awake/alert before oral intake   upright posture maintained  Oral Care:   mouth swabbed   suction provided   teeth brushed  Goal: Absence of Device-Related Skin or Tissue Injury  Intervention: Maintain Skin and Tissue Health  Recent Flowsheet Documentation  Taken 04/14/2024 2000 by Corinthia Sharper, RN  Device Skin Pressure Protection:   adhesive use limited   tubing/devices free from skin contact

## 2024-04-16 LAB — BLOOD GAS, VENOUS
BASE EXCESS VENOUS: 3.1 — ABNORMAL HIGH (ref -2.0–2.0)
BASE EXCESS VENOUS: 4.6 — ABNORMAL HIGH (ref -2.0–2.0)
BASE EXCESS VENOUS: 2.2 — ABNORMAL HIGH (ref -2.0–2.0)
CARBOXYHEMOGLOBIN, VENOUS: 1.8 % — ABNORMAL HIGH (ref <1.2)
CARBOXYHEMOGLOBIN, VENOUS: 1.6 % — ABNORMAL HIGH (ref <1.2)
CARBOXYHEMOGLOBIN, VENOUS: 1.6 % — ABNORMAL HIGH (ref <1.2)
HCO3 VENOUS: 28 mmol/L — ABNORMAL HIGH (ref 22–27)
HCO3 VENOUS: 29 mmol/L — ABNORMAL HIGH (ref 22–27)
HCO3 VENOUS: 27 mmol/L (ref 22–27)
METHEMOGLOBIN, VENOUS: <1 % (ref <1.5)
METHEMOGLOBIN, VENOUS: <1 % (ref <1.5)
METHEMOGLOBIN, VENOUS: <1 % (ref <1.5)
O2 SATURATION VENOUS: 80.1 % (ref 40.0–85.0)
O2 SATURATION VENOUS: 68.2 % (ref 40.0–85.0)
O2 SATURATION VENOUS: 64.2 % (ref 40.0–85.0)
OXYHEMOGLOBIN, VENOUS: 78.3 % (ref 40.0–85.0)
OXYHEMOGLOBIN, VENOUS: 66.8 % (ref 40.0–85.0)
OXYHEMOGLOBIN, VENOUS: 62.8 % (ref 40.0–85.0)
PCO2 VENOUS: 42 mmHg (ref 40–60)
PCO2 VENOUS: 44 mmHg (ref 40–60)
PCO2 VENOUS: 45 mmHg (ref 40–60)
PH VENOUS: 7.43 (ref 7.32–7.43)
PH VENOUS: 7.44 — ABNORMAL HIGH (ref 7.32–7.43)
PH VENOUS: 7.39 (ref 7.32–7.43)
PO2 VENOUS: 45 mmHg (ref 30–55)
PO2 VENOUS: 37 mmHg (ref 30–55)
PO2 VENOUS: 39 mmHg (ref 30–55)

## 2024-04-16 LAB — BASIC METABOLIC PANEL
ANION GAP: 11 mmol/L (ref 5–14)
BLOOD UREA NITROGEN: 14 mg/dL (ref 9–23)
BUN / CREAT RATIO: 29
CALCIUM: 8.9 mg/dL (ref 8.7–10.4)
CHLORIDE: 107 mmol/L (ref 98–107)
CO2: 28 mmol/L (ref 20.0–31.0)
CREATININE: 0.48 mg/dL — ABNORMAL LOW (ref 0.55–1.02)
EGFR CKD-EPI (2021) FEMALE: >90 mL/min/1.73m2 (ref >=60)
GLUCOSE RANDOM: 158 mg/dL (ref 70–179)
POTASSIUM: 4 mmol/L (ref 3.4–4.8)
SODIUM: 146 mmol/L — ABNORMAL HIGH (ref 135–145)

## 2024-04-16 LAB — CYSTATIN C
CYSTATIN C: 1.65 mg/L — ABNORMAL HIGH (ref 0.64–1.23)
EGFR CKD-EPI (2012) CYSTATIN C FEMALE: 35 mL/min/1.73m2 — ABNORMAL LOW (ref >=60)

## 2024-04-16 LAB — CBC W/ AUTO DIFF
BASOPHILS ABSOLUTE COUNT: 0 10*9/L (ref 0.0–0.1)
BASOPHILS RELATIVE PERCENT: 0.6 %
EOSINOPHILS ABSOLUTE COUNT: 0.1 10*9/L (ref 0.0–0.5)
EOSINOPHILS RELATIVE PERCENT: 1 %
HEMATOCRIT: 25.9 % — ABNORMAL LOW (ref 34.0–44.0)
HEMOGLOBIN: 8.7 g/dL — ABNORMAL LOW (ref 11.3–14.9)
LYMPHOCYTES ABSOLUTE COUNT: 1.1 10*9/L (ref 1.1–3.6)
LYMPHOCYTES RELATIVE PERCENT: 17.1 %
MEAN CORPUSCULAR HEMOGLOBIN CONC: 33.4 g/dL (ref 32.0–36.0)
MEAN CORPUSCULAR HEMOGLOBIN: 33.1 pg — ABNORMAL HIGH (ref 25.9–32.4)
MEAN CORPUSCULAR VOLUME: 99.1 fL — ABNORMAL HIGH (ref 77.6–95.7)
MEAN PLATELET VOLUME: 9 fL (ref 6.8–10.7)
MONOCYTES ABSOLUTE COUNT: 0.9 10*9/L — ABNORMAL HIGH (ref 0.3–0.8)
MONOCYTES RELATIVE PERCENT: 13.6 %
NEUTROPHILS ABSOLUTE COUNT: 4.4 10*9/L (ref 1.8–7.8)
NEUTROPHILS RELATIVE PERCENT: 67.7 %
PLATELET COUNT: 134 10*9/L — ABNORMAL LOW (ref 150–450)
RED BLOOD CELL COUNT: 2.61 10*12/L — ABNORMAL LOW (ref 3.95–5.13)
RED CELL DISTRIBUTION WIDTH: 20.4 % — ABNORMAL HIGH (ref 12.2–15.2)
WBC ADJUSTED: 6.5 10*9/L (ref 3.6–11.2)

## 2024-04-16 LAB — MAGNESIUM: MAGNESIUM: 2.1 mg/dL (ref 1.6–2.6)

## 2024-04-16 LAB — HEPATIC FUNCTION PANEL
ALBUMIN: 3.3 g/dL — ABNORMAL LOW (ref 3.4–5.0)
ALKALINE PHOSPHATASE: 148 U/L — ABNORMAL HIGH (ref 46–116)
ALT (SGPT): 13 U/L (ref 10–49)
AST (SGOT): 27 U/L (ref <=34)
BILIRUBIN DIRECT: 0.5 mg/dL — ABNORMAL HIGH (ref 0.00–0.30)
BILIRUBIN TOTAL: 0.9 mg/dL (ref 0.3–1.2)
PROTEIN TOTAL: 6 g/dL (ref 5.7–8.2)

## 2024-04-16 LAB — PHOSPHORUS: PHOSPHORUS: 2 mg/dL — ABNORMAL LOW (ref 2.4–5.1)

## 2024-04-16 LAB — APTT
APTT: 27.3 s (ref 24.8–38.4)
HEPARIN CORRELATION: 0.2

## 2024-04-16 MED ADMIN — sodium chloride (NS) 0.9 % flush 10 mL: 10 mL | INTRAVENOUS | @ 15:00:00

## 2024-04-16 MED ADMIN — sodium chloride (NS) 0.9 % flush 10 mL: 10 mL | INTRAVENOUS | @ 23:00:00

## 2024-04-16 MED ADMIN — sodium chloride (NS) 0.9 % flush 10 mL: 10 mL | INTRAVENOUS | @ 08:00:00

## 2024-04-16 MED ADMIN — rifAXIMin (XIFAXAN) oral suspension: 550 mg | GASTROENTERAL | @ 02:00:00 | Stop: 2025-04-11

## 2024-04-16 MED ADMIN — rifAXIMin (XIFAXAN) oral suspension: 550 mg | GASTROENTERAL | @ 14:00:00 | Stop: 2025-04-11

## 2024-04-16 MED ADMIN — folic acid (FOLVITE) tablet 1 mg: 1 mg | GASTROENTERAL | @ 14:00:00

## 2024-04-16 MED ADMIN — metoPROLOL tartrate (Lopressor) tablet 25 mg: 25 mg | GASTROENTERAL | @ 14:00:00

## 2024-04-16 MED ADMIN — metoPROLOL tartrate (Lopressor) tablet 25 mg: 25 mg | GASTROENTERAL | @ 02:00:00

## 2024-04-16 MED ADMIN — thiamine mononitrate (vit B1) tablet 100 mg: 100 mg | GASTROENTERAL | @ 14:00:00

## 2024-04-16 MED ADMIN — cyanocobalamin (vitamin B-12) tablet 1,000 mcg: 1000 ug | GASTROENTERAL | @ 14:00:00

## 2024-04-16 MED ADMIN — lactulose oral solution: 20 g | GASTROENTERAL | @ 02:00:00

## 2024-04-16 MED ADMIN — lactulose oral solution: 20 g | GASTROENTERAL | @ 20:00:00

## 2024-04-16 MED ADMIN — esomeprazole (NEXIUM) granules 40 mg: 40 mg | GASTROENTERAL | @ 14:00:00

## 2024-04-16 MED ADMIN — multivitamins, therapeutic with minerals tablet 1 tablet: 1 | GASTROENTERAL | @ 14:00:00

## 2024-04-16 MED ADMIN — sodium phosphate 30 mmol in dextrose 5 % 250 mL IVPB: 30 mmol | INTRAVENOUS | @ 11:00:00 | Stop: 2024-04-16

## 2024-04-16 MED ADMIN — melatonin tablet 3 mg: 3 mg | ORAL | @ 23:00:00

## 2024-04-16 MED ADMIN — NORepinephrine 8 mg in dextrose 5 % 250 mL (32 mcg/mL) infusion PMB: 0-30 ug/min | INTRAVENOUS | @ 23:00:00

## 2024-04-16 MED ADMIN — furosemide (LASIX) injection 40 mg: 40 mg | INTRAVENOUS | @ 15:00:00 | Stop: 2024-04-16

## 2024-04-16 NOTE — Plan of Care (Signed)
 Patient trial on PSV-CPAP, tolerated mode for a hour and a half. Tachypneic high 30's-40. No other changes made. Patient stable, no adverse affects noted. Will continue to monitor.    Problem: Mechanical Ventilation Invasive  Goal: Optimal Device Function  Intervention: Optimize Device Care and Function  Recent Flowsheet Documentation  Taken 04/16/2024 1451 by Heron Connors, RRT  Airway/Ventilation Management:   airway patency maintained   humidification applied   pulmonary hygiene promoted  Taken 04/16/2024 0759 by Heron Connors, RRT  Airway/Ventilation Management:   airway patency maintained   humidification applied   pulmonary hygiene promoted  Oral Care:   mouth swabbed   oral rinse provided   suction provided   teeth brushed   tongue brushed  Goal: Absence of Ventilator-Induced Lung Injury  Intervention: Prevent Ventilator-Associated Pneumonia  Recent Flowsheet Documentation  Taken 04/16/2024 1451 by Heron Connors, RRT  Head of Bed [facility]) Positioning: HOB at 30-45 degrees  VAP Prevention Bundle:   HOB elevation maintained   oral care regularly provided   vent circuit breaks minimized  Taken 04/16/2024 0759 by Heron, Branae Crail, RRT  Head of Bed [facility]) Positioning: HOB at 30-45 degrees  VAP Prevention Bundle:   HOB elevation maintained   oral care regularly provided   vent circuit breaks minimized  Oral Care:   mouth swabbed   oral rinse provided   suction provided   teeth brushed   tongue brushed     Problem: Artificial Airway  Goal: Effective Communication  Outcome: Ongoing - Unchanged  Goal: Optimal Device Function  Outcome: Ongoing - Unchanged  Intervention: Optimize Device Care and Function  Recent Flowsheet Documentation  Taken 04/16/2024 1451 by Heron Connors, RRT  Airway/Ventilation Management:   airway patency maintained   humidification applied   pulmonary hygiene promoted  Taken 04/16/2024 0759 by Heron Connors, RRT  Airway/Ventilation Management:   airway patency maintained   humidification applied   pulmonary hygiene promoted  Oral Care:   mouth swabbed   oral rinse provided   suction provided   teeth brushed   tongue brushed  Goal: Absence of Device-Related Skin or Tissue Injury  Outcome: Ongoing - Unchanged

## 2024-04-16 NOTE — Progress Notes (Signed)
 OTOLARYNGOLOGY HEAD & NECK SURGERY- PROGRESS NOTE    Assessment/Plan:  78 year old F with history of HFpEF, HTN, Afib on AC, MASLD cirrhosis, originally hospitalized for scheduled TEE/DCCV complicated by chronic respiratory failure requiring prolonged intubation. Now s/p tracheostomy 11/14.     - Ok to restart heparin gtt today from an ENT standpoint   - ENT will plan to remove surgicel POD2 - POD3  - Keep obturator at head of bed. Please keep extra 6 cuffed, 4 cuffed, 6 cuffless, and 4 cuffless tracheostomy tubes at bedside.  - Suction trach as ordered.  - Please keep mepilex under inferior flange of trach to prevent wound breakdown from pressure injury.   - Trach secured with 4 quadrant sutures and soft collar. Please do not cut trach ties or sutures.  - ENT to perform first trach change. Anticipate POD 5-7 or after weaned off ventilator.   - Wean from ventilator per primary team. Please involve the trach team and respiratory therapy in tracheostomy care.   - Please teach patient and family trach care when appropriate.  - ENT will continue to follow. Please page with questions or concerns.       Principal Problem:    Bacteremia, escherichia coli  Active Problems:    Alcoholic liver disease (HHS-HCC)    HTN (hypertension)    Depression with anxiety    Class 2 severe obesity with serious comorbidity in adult    Atrial fibrillation    (CMS-HCC)    Pleural effusion    Hypernatremia    Thrombocytopenia    Cirrhosis    (CMS-HCC)    A-fib (CMS-HCC)    Hepatic encephalopathy    (CMS-HCC)    Anaphylaxis       LOS: 37 days       Subjective:  No acute events overnight.  Afebrile vital signs stable with stable ventilator settings.  No desats.  No bleeding from the trach.  No issues with the vent or trach per primary nurse. Appropriate secretion burden and color. No blood from the tracheostoma.     Objective:     Vital signs in last 24 hours:  Temp:  [36.7 ??C (98 ??F)-37.3 ??C (99.1 ??F)] 36.8 ??C (98.2 ??F)  Pulse:  [81-114] 107  SpO2 Pulse:  [81-113] 104  Resp:  [14-41] 19  BP: (111-172)/(45-93) 122/72  MAP (mmHg):  [71-109] 89  A BP-2: (88-146)/(77-122) 125/120  MAP:  [67 mmHg-125 mmHg] 73 mmHg  FiO2 (%):  [30 %-40 %] 30 %  SpO2:  [95 %-100 %] 98 %    Intake/Output last 24 hours:  I/O last 3 completed shifts:  In: 4673.6 [P.O.:60; I.V.:288.4; NG/GT:3045; IV Piggyback:1280.2]  Out: 2455 [Urine:1250; Stool:1200; Blood:5]    Physical Exam  General: Sedated, laying supine, chronically ill-appearing, morbidly obese  Pulm: Mechanically ventilated   Neck: 6 cuffed Shiley tracheostomy tube in place, secured with 4 quadrant silk sutures and soft collar. Secretions appropriate in color, amount, and smell.

## 2024-04-16 NOTE — Plan of Care (Signed)
 UTA pt orientation. Able to intermittently follow simple commands. Afib. Other VSS. Vent 30%. No evidence of pain per CPOT. Purewick in place, adequate output. FMS in place, adequate output. Pt remains on TF at goal, tolerating well. Q2H turns and standard precautions maintained. No falls/injuries this shift. See MAR/flowsheets for further information.    Problem: Skin Injury Risk Increased  Goal: Skin Health and Integrity  Outcome: Shift Focus  Intervention: Optimize Skin Protection  Recent Flowsheet Documentation  Taken 04/16/2024 0600 by Edsel Silvano BROCKS, RN  Head of Bed [facility]) Positioning: HOB at 30-45 degrees  Taken 04/16/2024 0400 by Edsel Silvano BROCKS, RN  Head of Bed [facility]) Positioning: HOB at 30-45 degrees  Taken 04/16/2024 0200 by Edsel Silvano BROCKS, RN  Head of Bed [facility]) Positioning: HOB at 30-45 degrees  Taken 04/16/2024 0000 by Edsel Silvano BROCKS, RN  Head of Bed [facility]) Positioning: HOB at 30-45 degrees  Taken 04/15/2024 2200 by Edsel Silvano BROCKS, RN  Head of Bed [facility]) Positioning: HOB at 30-45 degrees  Taken 04/15/2024 2000 by Edsel Silvano BROCKS, RN  Pressure Reduction Techniques:   heels elevated off bed   weight shift assistance provided  Head of Bed (HOB) Positioning: HOB at 30-45 degrees  Pressure Reduction Devices:   heel offloading device utilized   positioning supports utilized  Skin Protection:   adhesive use limited   incontinence pads utilized   skin-to-device areas padded   skin-to-skin areas padded   transparent dressing maintained   tubing/devices free from skin contact     Problem: Adult Inpatient Plan of Care  Goal: Absence of Hospital-Acquired Illness or Injury  Outcome: Shift Focus  Intervention: Identify and Manage Fall Risk  Recent Flowsheet Documentation  Taken 04/15/2024 2000 by Edsel Silvano BROCKS, RN  Safety Interventions:   aspiration precautions   bed alarm   bleeding precautions   fall reduction program maintained   no IV/BP/blood draw left arm   room near unit station  Intervention: Prevent Skin Injury  Recent Flowsheet Documentation  Taken 04/16/2024 0600 by Edsel Silvano BROCKS, RN  Positioning for Skin: Left  Taken 04/16/2024 0400 by Edsel Silvano BROCKS, RN  Positioning for Skin: Right  Taken 04/16/2024 0200 by Edsel Silvano BROCKS, RN  Positioning for Skin: Left  Taken 04/16/2024 0000 by Edsel Silvano BROCKS, RN  Positioning for Skin: Right  Taken 04/15/2024 2200 by Edsel Silvano BROCKS, RN  Positioning for Skin: Left  Taken 04/15/2024 2000 by Edsel Silvano BROCKS, RN  Positioning for Skin: Right  Device Skin Pressure Protection:   absorbent pad utilized/changed   adhesive use limited   positioning supports utilized   pressure points protected  Skin Protection:   adhesive use limited   incontinence pads utilized   skin-to-device areas padded   skin-to-skin areas padded   transparent dressing maintained   tubing/devices free from skin contact  Intervention: Prevent Infection  Recent Flowsheet Documentation  Taken 04/15/2024 2000 by Edsel Silvano BROCKS, RN  Infection Prevention:   hand hygiene promoted   personal protective equipment utilized   rest/sleep promoted   single patient room provided     Problem: Non-Violent Restraints  Goal: Patient will remain free of restraint events  Outcome: Shift Focus  Goal: Patient will remain free of physical injury  Outcome: Shift Focus     Problem: Mechanical Ventilation Invasive  Goal: Effective Communication  Outcome: Shift Focus

## 2024-04-16 NOTE — Progress Notes (Signed)
 MICU Daily Progress Note     Date of Service: 04/16/2024    Problem List:   Principal Problem:    Bacteremia, escherichia coli  Active Problems:    Alcoholic liver disease (HHS-HCC)    HTN (hypertension)    Depression with anxiety    Class 2 severe obesity with serious comorbidity in adult    Atrial fibrillation    (CMS-HCC)    Pleural effusion    Hypernatremia    Thrombocytopenia    Cirrhosis    (CMS-HCC)    A-fib (CMS-HCC)    Hepatic encephalopathy    (CMS-HCC)    Anaphylaxis      Interval history: [name] is a 78 y.o. female with HFpEF, HTN, Afib on AC, MASLD cirrhosis, originally hospitalized for scheduled TEE/DCCV (aborted d/t LAA clot) c/b AHHRF, encephalopathy and septic shock requiring MICU care, was s/p extubation transferred to [facility] for further management of anticoagulation and hypernatremia. Transferred back to [facility] MICU for closer monitoring for respiratory status, pressor requirement, GI bleed, and management of carotid artery injury.     On 10/24, patient had acute worsening of respiratory status leading requiring intubation. When patient was given sedation for intubation, she coded and promptly had ROSC after a few minutes of CPR. Subsequently, CVC placed in neck, used to draw blood, unclear if given pressors through it, then found  to be in arterial system based on blood gas and imaging, likely right carotid. Line was subsequently removed, and pressure was held for 15 minutes, hemostasis with no hematoma. She was transferred to [facility] MICU for management of this carotid injury, to be evaluated by vascular surgery while also managing her increased vasopressor requirement and respiratory distress.  Has been weaned off pressors since 10/27 at 1 PM. VBG's indicating that she is ready for extubation, however her mentation suggests that she is not able to protect her airway.  She was extubated on 10/31, and mentation slowly improved.  Unfortunately on the morning of 11/2 she had increased lethargy, increased secretions, and she was not protecting her airway.  She was reintubated on 11/2, and required pressor support with norepinephrine and vasopressin after the induction agents.  Self extubated on 11/5. Re intubated on 11/6 due to hypercarbia and increased work of breathing.  We are left with the challenging options regarding the care of [name].  Due to her multiple failed extubations, we are concerned [name] will need a tracheostomy long-term to protect her airway.  There is a meeting with palliative care and family on 04/13/2024, her family agreed for tracheostomy.  Tracheostomy placed 11/14.       Neurological   Hepatic encephalopathy  Per chart review at [facility], rapid response called on 10/24 for increased somnolence. At the time, differential included hepatic encephalopathy, toxic metabolic encephalopathy in the setting of shock (possibly sepsis), versus intracranial pathology. She was subsequently intubated. CT head on 10/24 showed no acute intracranial abnormality. Weaned off sedation with slow improvements in neurological status.  While we have continued lactulose  therapy and seeing good response with several bowel movements, her mentation remains poor.  This is concerning for severe ICU delirium.  Meeting was had with daughter, [name], on 11/5.  Described that should intubation be necessary we would likely be moving towards a tracheostomy as [name] has continuously not been protecting her airway.  As of now [name] wanted to pursue aggressive measures including repeat intubation and potential tracheostomy. Tracheostomy placed 11/14.    -Continue lactulose  and rifaximin    -  continues to follow commands    Analgesia: No pain issues  RASS at goal? N/A, not on sedation  [place] Agitation Assessment Scale (RASS) : -2 (04/16/2024  8:00 PM)    Pulmonary   Acute Hypoxic Hypercarbic Respiratory Failure   Rapid response called on 10/24 for increased somnolence, found to have acute hypercarbic respiratory failure and subsequently intubated. Suspect hypercarbic respiratory failure due to encephalopathy as above. CXR on 10/24 showed worsening pleural effusions. Not currently getting diuresed. Given patient's hypoxia, and AMS, there was concern for infectious etiology. MRSA nares negative. Infectious workup thus far is unremarkable. SBTs show readiness extubation based on VBGs, however there is concern that she continues to be unable to protect her airway due to her current mentation.  Reintubated on 11/2 followed by therapeutic bronchoscopy.  Self extubated on 11/5.  Reintubate noted on 11/6 due to hypercarbia and not protecting her airway. Trach placed 11/14, complicated by anaphylactic reaction to rocuronium which was treated with epi, benadryl and steroids. Allergy added to chart.    -Monitor on pressure support, if she tolerates can transfer to step down to be weaned off vent    Vent Mode: PRVC  FiO2 (%): 30 %  S RR: 15  S VT: 320 mL  PEEP: 5 cm H20  PR SUP: 16 cm H20      Cardiovascular   Carotid Artery Injury (resolved)  At [facility], CVC placed intended for RIJ on 10/24 following intubation, PEA, ROSC. Was used with known blood return. Unclear if it was used for pressor support, but highly likely that it was. CXR for placement check showed concern for intra aterial location, and blood gas confirmed it was arterial. Thought to be in carotid artery. CVC removed with pressure held for 15 minutes, with no hematoma formation. On arrival, no physical exam and bedside ultrasound showed no large hematoma. No active bleeding.   - Vascular Surgery consulted, stated fistula has healed based on Ultrasound findings    PEA arrest s/p ROSC  Hypovolemic/Hemorrhagic shock (resolved)  Atrial fibrillation  known left atrial appendage clot  HFpEF  Patient with PEA arrest peri-intubation with ROSC. Shock thought to be hemorrhagic in the setting of GI bleed History of atrial fibrillation with known LAA clot and HFpEF. Vasopressors were weaned off 10/27. APTT was elevated the morning of 10/28 necessitating changing anticoagulation from therapeutic heparin to therapeutic Lovenox .   - Holding Coreg   - Started heparin drip for anticoagulation for LAA clot  - Continue metoprolol  -hemodynamically stable without pressors    Renal   Hypernatremia   At [facility], had persistently hypernatremia near 153. Had been treated with D5 gtt. Sodium throughout admission has largely been 146-148. Sodium was 151 morning of 10/28, presume this will decrease once we start tube feeds.  Sodium has overall normalized and continues to be within normal limits.  - Strict I/O's  - Sodium checks  -Gave a dose of lasix for concerns for fluid overload given bilateral lower extremity pitting edema  -increasing tube flushes to 150 mL q4    Infectious Disease/Autoimmune   E. Coli Bacteremia (resolved),   Initially admitted for afib, found to have E. coli bacteremia on October 10. This was treated with cefazolin . Rapid response called 10/24, patient noted to be hypothermic. WBCs jumped from 3.0 to 9.7 on 10/24. Although the most recent lab was following intubation and CPR. UA with pyuria, rare bacteria, hematuria. Peri-intubation, patient developed significant hypotension requiring initiation of NE and vasopressin. Suspect possible septic  shock with unknown infectious source. Patient started on vancomycin and cefepime 10/24. CT Abdomen pelvis concerning for aspiration pneumonia. Following infectious workup was unremarkable. Antibiotics were stopped on 10/27.  Infectious workup was repeated on 11/3 due to reintubation and status post bronchoscopy.    -Finished course of ceftriaxone    Cultures:  Blood Culture, Routine (no units)   Date Value   04/09/2024 No Growth at 5 days   04/09/2024 No Growth at 5 days     Urine Culture, Comprehensive (no units)   Date Value   04/09/2024 NO GROWTH   04/04/2024 NO GROWTH     Lower Respiratory Culture (no units)   Date Value 04/10/2024 2+ Methicillin-Susceptible Staphylococcus aureus (A)   04/10/2024 1+ Oropharyngeal Flora Isolated     WBC (10*9/L)   Date Value   04/16/2024 6.5     WBC, UA (/HPF)   Date Value   04/10/2024 7 (H)     TT: no abx    FEN/GI   Sigmoid Mass c/f Malignancy and Hematochezia (resolved)  Hemorrhagic Shock (resolved)  CT abdomen pelvis done 10/24 with evidence of cirrhosis and masslike thickening of the lower sigmoid colon concerning for malignancy.  CTA abdomen pelvis performed 1024 with active extravasation into the gastric fundus possibly secondary to traumatic enteric tube placement.  GI scoped the patient on 10/25 and clipped an area of active bleeding.  They removed a total of 1.5 L of blood.  Hemoglobin 10/20 6 AM downtrended to 6.0, so 2 units of blood transfused.  GI stated this was consistent with 10/25 scope and likely did not represent increased bleeding. Hb steadily improving with Hb of 9.2 on 10/28.   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 Given shock of unclear etiology. CTAP demonstrating cirrhosis. Likely that cirrhosis is contributing to hypotension.   - Continue lactulose ; target 3-5 BMs daily  - NGT in place, on TF and FWF  - Daily MELD labs and CMP     MELD 3.0: 15 at 04/05/2024  4:31 AM  MELD-Na: 13 at 04/05/2024  4:31 AM  Calculated from:  Serum Creatinine: 0.43 mg/dL (Using min of 1 mg/dL) at 88/09/7972  5:68 AM  Serum Sodium: 143 mmol/L (Using max of 137 mmol/L) at 04/05/2024  4:31 AM  Total Bilirubin: 1.3 mg/dL at 88/09/7972  5:68 AM  Serum Albumin: 2.4 g/dL at 88/09/7972  5:68 AM  INR(ratio): 1.61 at 04/03/2024 10:26 AM  Age at listing (hypothetical): 78 years  Sex: Female at 04/05/2024  4:31 AM    Provider Malnutrition Assessment:  Body mass index is 38.76 kg/m??. BMI Interpretation: >/= 30 and < 40, consistent with obesity, clinically significant requiring additional resources and complicating multiple aspects of patient care.  GLIM criteria:   Pt does not meet criteria  -I have screened this patient for malnutrition and they did NOT meet criteria for malnutrition based on GLIM criteria.  -TF and FWF; Dietician consulted, appreciate assistance    Heme/Coag   Acute blood loss anemia  Hemoglobin down trended to 6.0 on the morning of 10/26 thought to be secondary to acute GI bleed that was scoped and clipped 10/25.    -stable H/H  CTM      Endocrine   History of R HR+/HER2 low (2+) IDC Breast Cancer s/p Partial Mastectomy and Radiation (  2022)   - Continue home anastrozole     Integumentary   NAI   #  - WOCN consulted for high risk skin assessment No. Reason: Not indicated.    Prophylaxis/LDA/Restraints/Consults   ICU Checklist completed: yes (see ICU rounding navigator in Epic)    Patient Lines/Drains/Airways Status       Active Active Lines, Drains, & Airways       Name Placement date Placement time Site Days    Tracheostomy Shiley 6 Cuffed 04/15/24  1133  6  1    NG/OG Tube Feedings 10 Fr. Right nostril 04/01/24  1138  Right nostril  15    External Urinary Device 04/13/24 With Suction 04/13/24  1700  -- 3    PICC Double Lumen 04/06/24 Left Basilic 04/06/24  1558  Basilic  10                  Patient Lines/Drains/Airways Status       Active Wounds       Name Placement date Placement time Site Days    Wound 03/01/24 Other (Comment) Toe (Comment which one) Left;Other (Comment) Blister present on arrival, tx already in outpatient setting, in process of healing, surrounding erythema 03/01/24  0129  Toe (Comment which one)  46    Wound 03/13/24 Irritant Contact Dermatitis Incontinence Sacrum Mid gluteal cleft MASD? 03/13/24  --  Sacrum  34    Wound 04/15/24 Surgical Neck tracheostomy secured with sutures and trach collar ( surgicel applied 04/15/24  1144  Neck  1                  Goals of Care     Code Status: Orders Placed This Encounter   Procedures    Full Code     Standing Status:   Standing     Number of Occurrences:   1        Designated Healthcare Decision Maker:  Ms. Durante designated healthcare decision maker(s) is/are   HCDM (patient stated preference): Prosperi,[name] - Spouse - [phone]    HCDM (patient stated preference): Stefanski,[name] - Daughter - [phone]. See HCDM section of Epic sidebar/storyboard or ACP tab in patient chart for details regarding active HCDMs and patient capacity for decision-making.      Subjective     Patient intubated this morning on my exam. Follows commands and nods head yes and no. Denies pain.     Objective     Vitals - past 24 hours  Temp:  [36.8 ??C (98.2 ??F)-37.7 ??C (99.9 ??F)] 36.8 ??C (98.3 ??F)  Pulse:  [77-115] 77  SpO2 Pulse:  [73-113] 74  Resp:  [16-36] 29  BP: (86-145)/(37-107) 121/41  FiO2 (%):  [30 %] 30 %  SpO2:  [96 %-100 %] 98 % Intake/Output  I/O last 3 completed shifts:  In: 5090.2 [P.O.:60; I.V.:280.2; WH/HU:6534; IV Piggyback:1285]  Out: 1930 [Urine:1400; Stool:525; Blood:5]     Physical Exam:    General: intubated on my examination. Trach in place. Arouses to voice, answers yes/no questions  HEENT: normocephalic, atraumatic   CV: pulses palpable, regular borderline tachycardia   Pulm: Rhonchi w transmitted upper airway sounds throughout  GI: soft, NTND, + BS  MSK: Bilateral LE pitting edema  Skin: numerous large flat violaceous ecchymosis on left arm, chest, neck bilaterally.   Neuro: Withdrawals to painful stimuli. Responding to name and reliably following commands.     Continuous Infusions:   Infusions Meds[1]    Scheduled Medications:   Scheduled Medications[2]  PRN medications:  PRN Medications[3]    Data/Imaging Review: Reviewed in Epic and personally interpreted on 04/16/2024. See EMR for detailed results.      Waddell Mead, DO  PGY1 Emergency Medicine, [facility]                     [1]    [Provider Hold] heparin Stopped (04/15/24 0201) NORepinephrine bitartrate-NS 6 mcg/min (04/16/24 1830)   [2]    cyanocobalamin (vitamin B-12)  1,000 mcg Enteral tube: gastric Daily    esomeprazole  40 mg Enteral tube: gastric Daily    flu vac 2025 [id number](PF)  0.5 mL Intramuscular During hospitalization    folic acid   1 mg Enteral tube: gastric Daily    lactulose   20 g Enteral tube: gastric TID    melatonin  3 mg Oral QPM    metoPROLOL tartrate  25 mg Enteral tube: gastric BID    multivitamins (ADULT)  1 tablet Enteral tube: gastric Daily    rifAXIMin   550 mg Enteral tube: gastric BID    sodium chloride   10 mL Intravenous Q8H    sodium chloride   10 mL Intravenous Q8H    thiamine  mononitrate (vit B1)  100 mg Enteral tube: gastric Daily   [3] acetaminophen , docusate sodium, fentaNYL  (PF), haloperidol LACTATE **OR** [DISCONTINUED] haloperidol LACTATE, heparin (porcine)

## 2024-04-16 NOTE — Plan of Care (Signed)
 Pt has been drowsy this shift, minimally interactive. VSS, afebrile. Sats WNL on 30% PRVC most of the day. PSV trial for a few hours this afternoon. TF at goal. FMS d/ced. Purewick in place, diuresed with lasix. Falls precautions maintained. Q2hr turns.     Problem: Skin Injury Risk Increased  Goal: Skin Health and Integrity  Outcome: Shift Focus  Intervention: Optimize Skin Protection  Recent Flowsheet Documentation  Taken 04/16/2024 1600 by Sebastian Rollene HERO, RN  Pressure Reduction Techniques: heels elevated off bed  Head of Bed (HOB) Positioning: HOB at 30 degrees  Pressure Reduction Devices: heel offloading device utilized  Taken 04/16/2024 1400 by Sebastian Rollene HERO, RN  Pressure Reduction Techniques: heels elevated off bed  Head of Bed (HOB) Positioning: HOB at 30 degrees  Pressure Reduction Devices: heel offloading device utilized  Taken 04/16/2024 1200 by Sebastian Rollene HERO, RN  Pressure Reduction Techniques: heels elevated off bed  Head of Bed (HOB) Positioning: HOB at 30 degrees  Pressure Reduction Devices: heel offloading device utilized  Taken 04/16/2024 1000 by Sebastian Rollene HERO, RN  Pressure Reduction Techniques: heels elevated off bed  Head of Bed (HOB) Positioning: HOB at 30 degrees  Pressure Reduction Devices: heel offloading device utilized  Taken 04/16/2024 0800 by Sebastian Rollene HERO, RN  Pressure Reduction Techniques: heels elevated off bed  Head of Bed (HOB) Positioning: HOB at 30 degrees  Pressure Reduction Devices: heel offloading device utilized     Problem: Adult Inpatient Plan of Care  Goal: Optimal Comfort and Wellbeing  Outcome: Shift Focus     Problem: Self-Care Deficit  Goal: Improved Ability to Complete Activities of Daily Living  Outcome: Shift Focus

## 2024-04-16 NOTE — Plan of Care (Signed)
 Patient rested overnight on documented settings. Airway is patent and secure. Trach care completed with emergency equipment at bedside. Will continue to monitor.    Problem: Mechanical Ventilation Invasive  Goal: Optimal Device Function  Outcome: Ongoing - Unchanged     Problem: Artificial Airway  Goal: Optimal Device Function  Outcome: Ongoing - Unchanged

## 2024-04-17 LAB — URINALYSIS WITH MICROSCOPY
BACTERIA: NONE SEEN /HPF
BILIRUBIN UA: NEGATIVE
BLOOD UA: NEGATIVE
GLUCOSE UA: NEGATIVE
KETONES UA: NEGATIVE
LEUKOCYTE ESTERASE UA: NEGATIVE
NITRITE UA: NEGATIVE
PH UA: 6 (ref 5.0–9.0)
PROTEIN UA: NEGATIVE
RBC UA: 1 /HPF (ref <=4)
SPECIFIC GRAVITY UA: 1.013 (ref 1.003–1.030)
SQUAMOUS EPITHELIAL: <1 /HPF (ref 0–5)
UROBILINOGEN UA: <2
WBC UA: 1 /HPF (ref 0–5)

## 2024-04-17 LAB — BLOOD GAS, VENOUS
BASE EXCESS VENOUS: 4.7 — ABNORMAL HIGH (ref -2.0–2.0)
BASE EXCESS VENOUS: 4.1 — ABNORMAL HIGH (ref -2.0–2.0)
BASE EXCESS VENOUS: 4.2 — ABNORMAL HIGH (ref -2.0–2.0)
CARBOXYHEMOGLOBIN, VENOUS: 1.6 % — ABNORMAL HIGH (ref <1.2)
CARBOXYHEMOGLOBIN, VENOUS: 2.1 % — ABNORMAL HIGH (ref <1.2)
CARBOXYHEMOGLOBIN, VENOUS: 1.8 % — ABNORMAL HIGH (ref <1.2)
HCO3 VENOUS: 29 mmol/L — ABNORMAL HIGH (ref 22–27)
HCO3 VENOUS: 27 mmol/L (ref 22–27)
HCO3 VENOUS: 27 mmol/L (ref 22–27)
METHEMOGLOBIN, VENOUS: <1 % (ref <1.5)
METHEMOGLOBIN, VENOUS: <1 % (ref <1.5)
METHEMOGLOBIN, VENOUS: <1 % (ref <1.5)
O2 SATURATION VENOUS: 77.8 % (ref 40.0–85.0)
O2 SATURATION VENOUS: 77.2 % (ref 40.0–85.0)
O2 SATURATION VENOUS: 71.4 % (ref 40.0–85.0)
OXYHEMOGLOBIN, VENOUS: 76.3 % (ref 40.0–85.0)
OXYHEMOGLOBIN, VENOUS: 75.1 % (ref 40.0–85.0)
OXYHEMOGLOBIN, VENOUS: 69.6 % (ref 40.0–85.0)
PCO2 VENOUS: 41 mmHg (ref 40–60)
PCO2 VENOUS: 36 mmHg — ABNORMAL LOW (ref 40–60)
PCO2 VENOUS: 34 mmHg — ABNORMAL LOW (ref 40–60)
PH VENOUS: 7.46 — ABNORMAL HIGH (ref 7.32–7.43)
PH VENOUS: 7.5 — ABNORMAL HIGH (ref 7.32–7.43)
PH VENOUS: 7.52 — ABNORMAL HIGH (ref 7.32–7.43)
PO2 VENOUS: 43 mmHg (ref 30–55)
PO2 VENOUS: 44 mmHg (ref 30–55)
PO2 VENOUS: 39 mmHg (ref 30–55)

## 2024-04-17 LAB — MAGNESIUM: MAGNESIUM: 2 mg/dL (ref 1.6–2.6)

## 2024-04-17 LAB — CBC W/ AUTO DIFF
BASOPHILS ABSOLUTE COUNT: 0 10*9/L (ref 0.0–0.1)
BASOPHILS RELATIVE PERCENT: 0.6 %
EOSINOPHILS ABSOLUTE COUNT: 0.2 10*9/L (ref 0.0–0.5)
EOSINOPHILS RELATIVE PERCENT: 3.6 %
HEMATOCRIT: 26.6 % — ABNORMAL LOW (ref 34.0–44.0)
HEMOGLOBIN: 8.6 g/dL — ABNORMAL LOW (ref 11.3–14.9)
LYMPHOCYTES ABSOLUTE COUNT: 1.3 10*9/L (ref 1.1–3.6)
LYMPHOCYTES RELATIVE PERCENT: 24.3 %
MEAN CORPUSCULAR HEMOGLOBIN CONC: 32.3 g/dL (ref 32.0–36.0)
MEAN CORPUSCULAR HEMOGLOBIN: 32.1 pg (ref 25.9–32.4)
MEAN CORPUSCULAR VOLUME: 99.5 fL — ABNORMAL HIGH (ref 77.6–95.7)
MEAN PLATELET VOLUME: 9 fL (ref 6.8–10.7)
MONOCYTES ABSOLUTE COUNT: 0.7 10*9/L (ref 0.3–0.8)
MONOCYTES RELATIVE PERCENT: 13.5 %
NEUTROPHILS ABSOLUTE COUNT: 3 10*9/L (ref 1.8–7.8)
NEUTROPHILS RELATIVE PERCENT: 58 %
PLATELET COUNT: 141 10*9/L — ABNORMAL LOW (ref 150–450)
RED BLOOD CELL COUNT: 2.67 10*12/L — ABNORMAL LOW (ref 3.95–5.13)
RED CELL DISTRIBUTION WIDTH: 20.5 % — ABNORMAL HIGH (ref 12.2–15.2)
WBC ADJUSTED: 5.2 10*9/L (ref 3.6–11.2)

## 2024-04-17 LAB — BASIC METABOLIC PANEL
ANION GAP: 10 mmol/L (ref 5–14)
BLOOD UREA NITROGEN: 20 mg/dL (ref 9–23)
BUN / CREAT RATIO: 44
CALCIUM: 8.5 mg/dL — ABNORMAL LOW (ref 8.7–10.4)
CHLORIDE: 107 mmol/L (ref 98–107)
CO2: 28 mmol/L (ref 20.0–31.0)
CREATININE: 0.45 mg/dL — ABNORMAL LOW (ref 0.55–1.02)
EGFR CKD-EPI (2021) FEMALE: >90 mL/min/1.73m2 (ref >=60)
GLUCOSE RANDOM: 173 mg/dL (ref 70–179)
POTASSIUM: 3.8 mmol/L (ref 3.4–4.8)
SODIUM: 145 mmol/L (ref 135–145)

## 2024-04-17 LAB — HEPATIC FUNCTION PANEL
ALBUMIN: 2.9 g/dL — ABNORMAL LOW (ref 3.4–5.0)
ALKALINE PHOSPHATASE: 157 U/L — ABNORMAL HIGH (ref 46–116)
ALT (SGPT): 12 U/L (ref 10–49)
AST (SGOT): 27 U/L (ref <=34)
BILIRUBIN DIRECT: 0.5 mg/dL — ABNORMAL HIGH (ref 0.00–0.30)
BILIRUBIN TOTAL: 0.9 mg/dL (ref 0.3–1.2)
PROTEIN TOTAL: 5.3 g/dL — ABNORMAL LOW (ref 5.7–8.2)

## 2024-04-17 LAB — CYSTATIN C
CYSTATIN C: 1.71 mg/L — ABNORMAL HIGH (ref 0.64–1.23)
EGFR CKD-EPI (2012) CYSTATIN C FEMALE: 33 mL/min/1.73m2 — ABNORMAL LOW (ref >=60)

## 2024-04-17 LAB — APTT
APTT: 26.8 s (ref 24.8–38.4)
APTT: >400 s (ref 24.8–38.4)
APTT: 71.5 s — ABNORMAL HIGH (ref 24.8–38.4)
HEPARIN CORRELATION: 0.2
HEPARIN CORRELATION: >2.3
HEPARIN CORRELATION: 0.4

## 2024-04-17 LAB — AMMONIA: AMMONIA: 291 umol/L — ABNORMAL HIGH (ref 11–32)

## 2024-04-17 LAB — SLIDE REVIEW

## 2024-04-17 LAB — PHOSPHORUS: PHOSPHORUS: 2.7 mg/dL (ref 2.4–5.1)

## 2024-04-17 MED ADMIN — sodium chloride (NS) 0.9 % flush 10 mL: 10 mL | INTRAVENOUS | @ 23:00:00

## 2024-04-17 MED ADMIN — sodium chloride (NS) 0.9 % flush 10 mL: 10 mL | INTRAVENOUS | @ 16:00:00

## 2024-04-17 MED ADMIN — sodium chloride (NS) 0.9 % flush 10 mL: 10 mL | INTRAVENOUS | @ 08:00:00

## 2024-04-17 MED ADMIN — rifAXIMin (XIFAXAN) oral suspension: 550 mg | GASTROENTERAL | @ 13:00:00 | Stop: 2025-04-11

## 2024-04-17 MED ADMIN — rifAXIMin (XIFAXAN) oral suspension: 550 mg | GASTROENTERAL | @ 02:00:00 | Stop: 2025-04-11

## 2024-04-17 MED ADMIN — folic acid (FOLVITE) tablet 1 mg: 1 mg | GASTROENTERAL | @ 13:00:00

## 2024-04-17 MED ADMIN — metoPROLOL tartrate (Lopressor) tablet 25 mg: 25 mg | GASTROENTERAL | @ 02:00:00

## 2024-04-17 MED ADMIN — thiamine mononitrate (vit B1) tablet 100 mg: 100 mg | GASTROENTERAL | @ 13:00:00

## 2024-04-17 MED ADMIN — cyanocobalamin (vitamin B-12) tablet 1,000 mcg: 1000 ug | GASTROENTERAL | @ 13:00:00

## 2024-04-17 MED ADMIN — famotidine (PEPCID) tablet 20 mg: 20 mg | GASTROENTERAL | @ 16:00:00

## 2024-04-17 MED ADMIN — esomeprazole (NEXIUM) granules 40 mg: 40 mg | GASTROENTERAL | @ 13:00:00

## 2024-04-17 MED ADMIN — multivitamins, therapeutic with minerals tablet 1 tablet: 1 | GASTROENTERAL | @ 13:00:00

## 2024-04-17 MED ADMIN — melatonin tablet 3 mg: 3 mg | ORAL | @ 23:00:00

## 2024-04-17 MED ADMIN — heparin 25,000 Units/250 mL (100 units/mL) in 0.45% saline infusion (premade): 0-24 [IU]/kg/h | INTRAVENOUS | @ 13:00:00

## 2024-04-17 MED ADMIN — lactulose oral solution: 30 g | GASTROENTERAL | @ 19:00:00

## 2024-04-17 MED ADMIN — potassium chloride 10 mEq in 100 mL IVPB: 10 meq | INTRAVENOUS | @ 12:00:00 | Stop: 2024-04-17

## 2024-04-17 MED ADMIN — potassium chloride 10 mEq in 100 mL IVPB: 10 meq | INTRAVENOUS | @ 13:00:00 | Stop: 2024-04-17

## 2024-04-17 NOTE — Plan of Care (Signed)
 Pt trialed on PSV today, tolerated for about 90 minutes. Changed mode back to [facility] due to tachypnea in the high 30's-mid 40's. Pt currently tolerating PRVC on documented settings. Trach care completed during shift, suctioned for a small/moderate amount of thick tan secretions. Emergency equipment available at bedside.     Problem: Mechanical Ventilation Invasive  Goal: Effective Communication  Outcome: Ongoing - Unchanged  Goal: Optimal Device Function  Outcome: Ongoing - Unchanged  Goal: Mechanical Ventilation Liberation  Outcome: Ongoing - Unchanged  Goal: Absence of Device-Related Skin and Tissue Injury  Outcome: Ongoing - Unchanged  Goal: Absence of Ventilator-Induced Lung Injury  Outcome: Ongoing - Unchanged     Problem: Artificial Airway  Goal: Effective Communication  Outcome: Ongoing - Unchanged  Goal: Optimal Device Function  Outcome: Ongoing - Unchanged  Goal: Absence of Device-Related Skin or Tissue Injury  Outcome: Ongoing - Unchanged

## 2024-04-17 NOTE — Plan of Care (Signed)
 UTA pt orientation. Unable to follow commands. Afib. Other VSS. Vent 30%. No evidence of pain per CPOT. Purewick in place, adequate output. 2 BM this shift. Pt remains on TF at goal, tolerating well. Q2H turns and standard precautions maintained. No falls/injuries this shift. See MAR/flowsheets for further information.    Problem: Skin Injury Risk Increased  Goal: Skin Health and Integrity  Outcome: Shift Focus  Intervention: Optimize Skin Protection  Recent Flowsheet Documentation  Taken 04/17/2024 0400 by Edsel Silvano BROCKS, RN  Head of Bed [facility]) Positioning: HOB at 30-45 degrees  Taken 04/17/2024 0200 by Edsel Silvano BROCKS, RN  Head of Bed [facility]) Positioning: HOB at 30-45 degrees  Taken 04/17/2024 0000 by Edsel Silvano BROCKS, RN  Head of Bed [facility]) Positioning: HOB at 30-45 degrees  Taken 04/16/2024 2200 by Edsel Silvano BROCKS, RN  Head of Bed [facility]) Positioning: HOB at 30-45 degrees  Taken 04/16/2024 2000 by Edsel Silvano BROCKS, RN  Pressure Reduction Techniques:   heels elevated off bed   weight shift assistance provided  Head of Bed (HOB) Positioning: HOB at 30-45 degrees  Pressure Reduction Devices:   heel offloading device utilized   positioning supports utilized  Skin Protection:   adhesive use limited   incontinence pads utilized   skin-to-device areas padded   skin-to-skin areas padded   transparent dressing maintained   tubing/devices free from skin contact     Problem: Breathing Pattern Ineffective  Goal: Effective Breathing Pattern  Outcome: Shift Focus  Intervention: Promote Improved Breathing Pattern  Recent Flowsheet Documentation  Taken 04/17/2024 0400 by Edsel Silvano BROCKS, RN  Head of Bed [facility]) Positioning: HOB at 30-45 degrees  Taken 04/17/2024 0200 by Edsel Silvano BROCKS, RN  Head of Bed [facility]) Positioning: HOB at 30-45 degrees  Taken 04/17/2024 0000 by Edsel Silvano BROCKS, RN  Head of Bed [facility]) Positioning: HOB at 30-45 degrees  Taken 04/16/2024 2200 by Edsel Silvano BROCKS, RN  Head of Bed [facility]) Positioning: HOB at 30-45 degrees  Taken 04/16/2024 2000 by Edsel Silvano BROCKS, RN  Head of Bed [facility]) Positioning: HOB at 30-45 degrees     Problem: Non-Violent Restraints  Goal: Patient will remain free of restraint events  Outcome: Shift Focus  Goal: Patient will remain free of physical injury  Outcome: Shift Focus     Problem: Adult Inpatient Plan of Care  Goal: Absence of Hospital-Acquired Illness or Injury  Intervention: Identify and Manage Fall Risk  Recent Flowsheet Documentation  Taken 04/16/2024 2000 by Edsel Silvano BROCKS, RN  Safety Interventions:   aspiration precautions   bed alarm   bleeding precautions   enteral feeding safety   fall reduction program maintained   no IV/BP/blood draw left arm   room near unit station  Intervention: Prevent Skin Injury  Recent Flowsheet Documentation  Taken 04/17/2024 0400 by Edsel Silvano BROCKS, RN  Positioning for Skin: Right  Taken 04/17/2024 0200 by Edsel Silvano BROCKS, RN  Positioning for Skin: Left  Taken 04/17/2024 0000 by Edsel Silvano BROCKS, RN  Positioning for Skin: Right  Taken 04/16/2024 2200 by Edsel Silvano BROCKS, RN  Positioning for Skin: Left  Taken 04/16/2024 2000 by Edsel Silvano BROCKS, RN  Positioning for Skin: Right  Device Skin Pressure Protection:   absorbent pad utilized/changed   adhesive use limited   positioning supports utilized   pressure points protected  Skin Protection:   adhesive use limited   incontinence pads utilized   skin-to-device areas padded   skin-to-skin areas padded   transparent  dressing maintained   tubing/devices free from skin contact  Intervention: Prevent Infection  Recent Flowsheet Documentation  Taken 04/16/2024 2000 by Edsel Silvano BROCKS, RN  Infection Prevention:   hand hygiene promoted   personal protective equipment utilized   rest/sleep promoted   single patient room provided     Problem: Fall Injury Risk  Goal: Absence of Fall and Fall-Related Injury  Intervention: Promote Injury-Free Environment  Recent Flowsheet Documentation  Taken 04/16/2024 2000 by Edsel Silvano BROCKS, RN  Safety Interventions:   aspiration precautions   bed alarm   bleeding precautions   enteral feeding safety   fall reduction program maintained   no IV/BP/blood draw left arm   room near unit station     Problem: Gas Exchange Impaired  Goal: Optimal Gas Exchange  Intervention: Optimize Oxygenation and Ventilation  Recent Flowsheet Documentation  Taken 04/17/2024 0400 by Edsel Silvano BROCKS, RN  Head of Bed [facility]) Positioning: HOB at 30-45 degrees  Taken 04/17/2024 0200 by Edsel Silvano BROCKS, RN  Head of Bed [facility]) Positioning: HOB at 30-45 degrees  Taken 04/17/2024 0000 by Edsel Silvano BROCKS, RN  Head of Bed [facility]) Positioning: HOB at 30-45 degrees  Taken 04/16/2024 2200 by Edsel Silvano BROCKS, RN  Head of Bed [facility]) Positioning: HOB at 30-45 degrees  Taken 04/16/2024 2000 by Edsel Silvano BROCKS, RN  Head of Bed [facility]) Positioning: HOB at 30-45 degrees     Problem: Non-Violent Restraints  Intervention: Utilize least restrictive measures  Recent Flowsheet Documentation  Taken 04/17/2024 0400 by Edsel Silvano BROCKS, RN  Less Restrictive Alternative: 1:1 patient care  Taken 04/17/2024 0200 by Edsel Silvano BROCKS, RN  Less Restrictive Alternative: 1:1 patient care  Taken 04/17/2024 0000 by Edsel Silvano BROCKS, RN  Less Restrictive Alternative: 1:1 patient care  Taken 04/16/2024 2200 by Edsel Silvano BROCKS, RN  Less Restrictive Alternative: 1:1 patient care  Taken 04/16/2024 2000 by Edsel Silvano BROCKS, RN  Less Restrictive Alternative: 1:1 patient care  Intervention: Patient Monitoring  Recent Flowsheet Documentation  Taken 04/17/2024 0400 by Edsel Silvano BROCKS, RN  Psychological Status/Visual Check: Subdued  Circulation/Skin Integrity: No signs of injury  Range of Motion: Performed  Fluids: NPO  Food/Meal: Enteral feeding/TPN  Elimination: (Purewick) Urinary catheter  Taken 04/17/2024 0200 by Edsel Silvano BROCKS, RN  Psychological Status/Visual Check: Subdued  Circulation/Skin Integrity: No signs of injury  Range of Motion: Performed  Fluids: NPO  Food/Meal: Enteral feeding/TPN  Elimination: (Purewick) Urinary catheter  Taken 04/17/2024 0000 by Edsel Silvano BROCKS, RN  Psychological Status/Visual Check: Subdued  Circulation/Skin Integrity: No signs of injury  Range of Motion: Performed  Fluids: NPO  Food/Meal: Enteral feeding/TPN  Elimination: (Purewick) Urinary catheter  Taken 04/16/2024 2200 by Edsel Silvano BROCKS, RN  Psychological Status/Visual Check: Subdued  Circulation/Skin Integrity: No signs of injury  Range of Motion: Performed  Fluids: NPO  Food/Meal: Enteral feeding/TPN  Elimination: (Purewick) Urinary catheter  Taken 04/16/2024 2000 by Edsel Silvano BROCKS, RN  Psychological Status/Visual Check: Subdued  Circulation/Skin Integrity: No signs of injury  Range of Motion: Performed  Fluids: NPO  Food/Meal: Enteral feeding/TPN  Elimination: (Purewick) Urinary catheter     Problem: Wound  Goal: Absence of Infection Signs and Symptoms  Intervention: Prevent or Manage Infection  Recent Flowsheet Documentation  Taken 04/16/2024 2000 by Edsel Silvano BROCKS, RN  Infection Management: aseptic technique maintained  Goal: Skin Health and Integrity  Intervention: Optimize Skin Protection  Recent Flowsheet Documentation  Taken 04/17/2024 0400 by Edsel Silvano BROCKS, RN  Head of Bed [facility]) Positioning: HOB at 30-45 degrees  Taken 04/17/2024 0200 by Edsel Silvano BROCKS, RN  Head of Bed [facility]) Positioning: HOB at 30-45 degrees  Taken 04/17/2024 0000 by Edsel Silvano BROCKS, RN  Head of Bed [facility]) Positioning: HOB at 30-45 degrees  Taken 04/16/2024 2200 by Edsel Silvano BROCKS, RN  Head of Bed [facility]) Positioning: HOB at 30-45 degrees  Taken 04/16/2024 2000 by Edsel Silvano BROCKS, RN  Pressure Reduction Techniques:   heels elevated off bed   weight shift assistance provided  Head of Bed (HOB) Positioning: HOB at 30-45 degrees  Pressure Reduction Devices:   heel offloading device utilized   positioning supports utilized  Skin Protection: adhesive use limited   incontinence pads utilized   skin-to-device areas padded   skin-to-skin areas padded   transparent dressing maintained   tubing/devices free from skin contact     Problem: Mechanical Ventilation Invasive  Goal: Optimal Device Function  Intervention: Optimize Device Care and Function  Recent Flowsheet Documentation  Taken 04/17/2024 0400 by Edsel Silvano BROCKS, RN  Oral Care:   mouth swabbed   suction provided  Taken 04/17/2024 0000 by Edsel Silvano BROCKS, RN  Oral Care:   mouth swabbed   suction provided  Taken 04/16/2024 2000 by Edsel Silvano BROCKS, RN  Oral Care:   mouth swabbed   suction provided   teeth brushed  Goal: Absence of Device-Related Skin and Tissue Injury  Intervention: Maintain Skin and Tissue Health  Recent Flowsheet Documentation  Taken 04/16/2024 2000 by Edsel Silvano BROCKS, RN  Device Skin Pressure Protection:   absorbent pad utilized/changed   adhesive use limited   positioning supports utilized   pressure points protected  Goal: Absence of Ventilator-Induced Lung Injury  Intervention: Prevent Ventilator-Associated Pneumonia  Recent Flowsheet Documentation  Taken 04/17/2024 0400 by Edsel Silvano BROCKS, RN  Head of Bed [facility]) Positioning: HOB at 30-45 degrees  Oral Care:   mouth swabbed   suction provided  Taken 04/17/2024 0200 by Edsel Silvano BROCKS, RN  Head of Bed [facility]) Positioning: HOB at 30-45 degrees  Taken 04/17/2024 0000 by Edsel Silvano BROCKS, RN  Head of Bed [facility]) Positioning: HOB at 30-45 degrees  Oral Care:   mouth swabbed   suction provided  Taken 04/16/2024 2200 by Edsel Silvano BROCKS, RN  Head of Bed [facility]) Positioning: HOB at 30-45 degrees  Taken 04/16/2024 2000 by Edsel Silvano BROCKS, RN  Head of Bed [facility]) Positioning: HOB at 30-45 degrees  Oral Care:   mouth swabbed   suction provided   teeth brushed     Problem: Mechanical Ventilation Invasive  Goal: Optimal Device Function  Intervention: Optimize Device Care and Function  Recent Flowsheet Documentation  Taken 04/17/2024 0400 by Edsel Silvano BROCKS, RN  Oral Care:   mouth swabbed   suction provided  Taken 04/17/2024 0000 by Edsel Silvano BROCKS, RN  Oral Care:   mouth swabbed   suction provided  Taken 04/16/2024 2000 by Edsel Silvano BROCKS, RN  Oral Care:   mouth swabbed   suction provided   teeth brushed  Goal: Absence of Device-Related Skin and Tissue Injury  Intervention: Maintain Skin and Tissue Health  Recent Flowsheet Documentation  Taken 04/16/2024 2000 by Edsel Silvano BROCKS, RN  Device Skin Pressure Protection:   absorbent pad utilized/changed   adhesive use limited   positioning supports utilized   pressure points protected  Goal: Absence of Ventilator-Induced Lung Injury  Intervention: Prevent Ventilator-Associated Pneumonia  Recent Flowsheet Documentation  Taken 04/17/2024  0400 by Edsel Silvano BROCKS, RN  Head of Bed [facility]) Positioning: HOB at 30-45 degrees  Oral Care:   mouth swabbed   suction provided  Taken 04/17/2024 0200 by Edsel Silvano BROCKS, RN  Head of Bed [facility]) Positioning: HOB at 30-45 degrees  Taken 04/17/2024 0000 by Edsel Silvano BROCKS, RN  Head of Bed [facility]) Positioning: HOB at 30-45 degrees  Oral Care:   mouth swabbed   suction provided  Taken 04/16/2024 2200 by Edsel Silvano BROCKS, RN  Head of Bed [facility]) Positioning: HOB at 30-45 degrees  Taken 04/16/2024 2000 by Edsel Silvano BROCKS, RN  Head of Bed [facility]) Positioning: HOB at 30-45 degrees  Oral Care:   mouth swabbed   suction provided   teeth brushed     Problem: Mechanical Ventilation Invasive  Goal: Optimal Device Function  Intervention: Optimize Device Care and Function  Recent Flowsheet Documentation  Taken 04/17/2024 0400 by Edsel Silvano BROCKS, RN  Oral Care:   mouth swabbed   suction provided  Taken 04/17/2024 0000 by Edsel Silvano BROCKS, RN  Oral Care:   mouth swabbed   suction provided  Taken 04/16/2024 2000 by Edsel Silvano BROCKS, RN  Oral Care:   mouth swabbed   suction provided   teeth brushed  Goal: Absence of Device-Related Skin and Tissue Injury  Intervention: Maintain Skin and Tissue Health  Recent Flowsheet Documentation  Taken 04/16/2024 2000 by Edsel Silvano BROCKS, RN  Device Skin Pressure Protection:   absorbent pad utilized/changed   adhesive use limited   positioning supports utilized   pressure points protected  Goal: Absence of Ventilator-Induced Lung Injury  Intervention: Prevent Ventilator-Associated Pneumonia  Recent Flowsheet Documentation  Taken 04/17/2024 0400 by Edsel Silvano BROCKS, RN  Head of Bed [facility]) Positioning: HOB at 30-45 degrees  Oral Care:   mouth swabbed   suction provided  Taken 04/17/2024 0200 by Edsel Silvano BROCKS, RN  Head of Bed [facility]) Positioning: HOB at 30-45 degrees  Taken 04/17/2024 0000 by Edsel Silvano BROCKS, RN  Head of Bed [facility]) Positioning: HOB at 30-45 degrees  Oral Care:   mouth swabbed   suction provided  Taken 04/16/2024 2200 by Edsel Silvano BROCKS, RN  Head of Bed [facility]) Positioning: HOB at 30-45 degrees  Taken 04/16/2024 2000 by Edsel Silvano BROCKS, RN  Head of Bed [facility]) Positioning: HOB at 30-45 degrees  Oral Care:   mouth swabbed   suction provided   teeth brushed     Problem: Mechanical Ventilation Invasive  Goal: Optimal Device Function  Intervention: Optimize Device Care and Function  Recent Flowsheet Documentation  Taken 04/17/2024 0400 by Edsel Silvano BROCKS, RN  Oral Care:   mouth swabbed   suction provided  Taken 04/17/2024 0000 by Edsel Silvano BROCKS, RN  Oral Care:   mouth swabbed   suction provided  Taken 04/16/2024 2000 by Edsel Silvano BROCKS, RN  Oral Care:   mouth swabbed   suction provided   teeth brushed  Goal: Absence of Device-Related Skin and Tissue Injury  Intervention: Maintain Skin and Tissue Health  Recent Flowsheet Documentation  Taken 04/16/2024 2000 by Edsel Silvano BROCKS, RN  Device Skin Pressure Protection:   absorbent pad utilized/changed   adhesive use limited   positioning supports utilized   pressure points protected  Goal: Absence of Ventilator-Induced Lung Injury  Intervention: Prevent Ventilator-Associated Pneumonia  Recent Flowsheet Documentation  Taken 04/17/2024 0400 by Edsel Silvano BROCKS, RN  Head of Bed [facility]) Positioning: HOB at 30-45 degrees  Oral Care:  mouth swabbed   suction provided  Taken 04/17/2024 0200 by Edsel Silvano BROCKS, RN  Head of Bed [facility]) Positioning: HOB at 30-45 degrees  Taken 04/17/2024 0000 by Edsel Silvano BROCKS, RN  Head of Bed [facility]) Positioning: HOB at 30-45 degrees  Oral Care:   mouth swabbed   suction provided  Taken 04/16/2024 2200 by Edsel Silvano BROCKS, RN  Head of Bed [facility]) Positioning: HOB at 30-45 degrees  Taken 04/16/2024 2000 by Edsel Silvano BROCKS, RN  Head of Bed [name]) Positioning: HOB at 30-45 degrees  Oral Care:   mouth swabbed   suction provided   teeth brushed     Problem: Infection  Goal: Absence of Infection Signs and Symptoms  Intervention: Prevent or Manage Infection  Recent Flowsheet Documentation  Taken 04/16/2024 2000 by Edsel Silvano BROCKS, RN  Infection Management: aseptic technique maintained     Problem: Mechanical Ventilation Invasive  Goal: Optimal Device Function  Intervention: Optimize Device Care and Function  Recent Flowsheet Documentation  Taken 04/17/2024 0400 by Edsel Silvano BROCKS, RN  Oral Care:   mouth swabbed   suction provided  Taken 04/17/2024 0000 by Edsel Silvano BROCKS, RN  Oral Care:   mouth swabbed   suction provided  Taken 04/16/2024 2000 by Edsel Silvano BROCKS, RN  Oral Care:   mouth swabbed   suction provided   teeth brushed  Goal: Absence of Device-Related Skin and Tissue Injury  Intervention: Maintain Skin and Tissue Health  Recent Flowsheet Documentation  Taken 04/16/2024 2000 by Edsel Silvano BROCKS, RN  Device Skin Pressure Protection:   absorbent pad utilized/changed   adhesive use limited   positioning supports utilized   pressure points protected  Goal: Absence of Ventilator-Induced Lung Injury  Intervention: Prevent Ventilator-Associated Pneumonia  Recent Flowsheet Documentation  Taken 04/17/2024 0400 by Edsel Silvano BROCKS, RN  Head of Bed [facility]) Positioning: HOB at 30-45 degrees  Oral Care:   mouth swabbed   suction provided  Taken 04/17/2024 0200 by Edsel Silvano BROCKS, RN  Head of Bed [facility]) Positioning: HOB at 30-45 degrees  Taken 04/17/2024 0000 by Edsel Silvano BROCKS, RN  Head of Bed [facility]) Positioning: HOB at 30-45 degrees  Oral Care:   mouth swabbed   suction provided  Taken 04/16/2024 2200 by Edsel Silvano BROCKS, RN  Head of Bed [facility]) Positioning: HOB at 30-45 degrees  Taken 04/16/2024 2000 by Edsel Silvano BROCKS, RN  Head of Bed [facility]) Positioning: HOB at 30-45 degrees  Oral Care:   mouth swabbed   suction provided   teeth brushed     Problem: Mechanical Ventilation Invasive  Goal: Optimal Device Function  Intervention: Optimize Device Care and Function  Recent Flowsheet Documentation  Taken 04/17/2024 0400 by Edsel Silvano BROCKS, RN  Oral Care:   mouth swabbed   suction provided  Taken 04/17/2024 0000 by Edsel Silvano BROCKS, RN  Oral Care:   mouth swabbed   suction provided  Taken 04/16/2024 2000 by Edsel Silvano BROCKS, RN  Oral Care:   mouth swabbed   suction provided   teeth brushed  Goal: Absence of Device-Related Skin and Tissue Injury  Intervention: Maintain Skin and Tissue Health  Recent Flowsheet Documentation  Taken 04/16/2024 2000 by Edsel Silvano BROCKS, RN  Device Skin Pressure Protection:   absorbent pad utilized/changed   adhesive use limited   positioning supports utilized   pressure points protected  Goal: Absence of Ventilator-Induced Lung Injury  Intervention: Prevent Ventilator-Associated Pneumonia  Recent Flowsheet Documentation  Taken 04/17/2024 0400 by  Edsel Silvano BROCKS, RN  Head of Bed [facility]) Positioning: HOB at 30-45 degrees  Oral Care:   mouth swabbed   suction provided  Taken 04/17/2024 0200 by Edsel Silvano BROCKS, RN  Head of Bed [facility]) Positioning: HOB at 30-45 degrees  Taken 04/17/2024 0000 by Edsel Silvano BROCKS, RN  Head of Bed [facility]) Positioning: HOB at 30-45 degrees  Oral Care:   mouth swabbed   suction provided  Taken 04/16/2024 2200 by Edsel Silvano BROCKS, RN  Head of Bed [facility]) Positioning: HOB at 30-45 degrees  Taken 04/16/2024 2000 by Edsel Silvano BROCKS, RN  Head of Bed [facility]) Positioning: HOB at 30-45 degrees  Oral Care:   mouth swabbed   suction provided   teeth brushed     Problem: Artificial Airway  Goal: Optimal Device Function  Intervention: Optimize Device Care and Function  Recent Flowsheet Documentation  Taken 04/17/2024 0400 by Edsel Silvano BROCKS, RN  Oral Care:   mouth swabbed   suction provided  Taken 04/17/2024 0000 by Edsel Silvano BROCKS, RN  Oral Care:   mouth swabbed   suction provided  Taken 04/16/2024 2000 by Edsel Silvano BROCKS, RN  Aspiration Precautions:   oral hygiene care promoted   respiratory status monitored   tube feeding placement verified   upright posture maintained  Oral Care:   mouth swabbed   suction provided   teeth brushed  Goal: Absence of Device-Related Skin or Tissue Injury  Intervention: Maintain Skin and Tissue Health  Recent Flowsheet Documentation  Taken 04/16/2024 2000 by Edsel Silvano BROCKS, RN  Device Skin Pressure Protection:   absorbent pad utilized/changed   adhesive use limited   positioning supports utilized   pressure points protected

## 2024-04-17 NOTE — Plan of Care (Signed)
 Patient rested overnight on PRVC. PSV trails are being performed during the day time. Airway is patent and secure. Trach care completed with emergency equipment at bedside. Will continue to monitor.    Problem: Mechanical Ventilation Invasive  Goal: Optimal Device Function  Outcome: Ongoing - Unchanged     Problem: Artificial Airway  Goal: Optimal Device Function  Outcome: Ongoing - Unchanged

## 2024-04-17 NOTE — Progress Notes (Signed)
 PT transported to CT scan and return to micu without incident and traveled with all appropriate safety equipment.  Pt now back in room and on icu vent.  VEAR Dessert RN and others at bedside.

## 2024-04-17 NOTE — Progress Notes (Signed)
 OTOLARYNGOLOGY HEAD & NECK SURGERY- PROGRESS NOTE    Assessment/Plan:  78 year old F with history of HFpEF, HTN, Afib on AC, MASLD cirrhosis, originally hospitalized for scheduled TEE/DCCV complicated by chronic respiratory failure requiring prolonged intubation. Now s/p tracheostomy 11/14.     - Ok to restart heparin gtt from an ENT standpoint   - Surgicel removed this morning   - Keep obturator at head of bed. Please keep extra 6 cuffed, 4 cuffed, 6 cuffless, and 4 cuffless tracheostomy tubes at bedside.  - Suction trach as ordered.  - Please keep mepilex under inferior flange of trach to prevent wound breakdown from pressure injury.   - Trach secured with 4 quadrant sutures and soft collar. Please do not cut trach ties or sutures.  - ENT to perform first trach change. Anticipate POD 5-7 or after weaned off ventilator.   - Wean from ventilator per primary team. Please involve the trach team and respiratory therapy in tracheostomy care.   - Please teach patient and family trach care when appropriate.  - ENT will continue to follow. Please page with questions or concerns.       Principal Problem:    Bacteremia, escherichia coli  Active Problems:    Alcoholic liver disease (HHS-HCC)    HTN (hypertension)    Depression with anxiety    Class 2 severe obesity with serious comorbidity in adult    Atrial fibrillation    (CMS-HCC)    Pleural effusion    Hypernatremia    Thrombocytopenia    Cirrhosis    (CMS-HCC)    A-fib (CMS-HCC)    Hepatic encephalopathy    (CMS-HCC)    Anaphylaxis       LOS: 38 days       Subjective:  No acute events overnight.  Afebrile vital signs stable with stable ventilator settings.  No desats.  No bleeding from the trach.  No issues with the vent or trach per primary nurse. Appropriate secretion burden and color. No blood from the tracheostoma.     Objective:     Vital signs in last 24 hours:  Temp:  [36.7 ??C (98 ??F)-37.7 ??C (99.9 ??F)] 36.7 ??C (98 ??F)  Pulse:  [77-115] 83  SpO2 Pulse:  [73-113] 73  Resp:  [16-36] 34  BP: (86-136)/(36-107) 108/46  MAP (mmHg):  [53-114] 66  FiO2 (%):  [30 %] 30 %  SpO2:  [96 %-100 %] 97 %    Intake/Output last 24 hours:  I/O last 3 completed shifts:  In: 5090.2 [P.O.:60; I.V.:280.2; WH/HU:6534; IV Piggyback:1285]  Out: 1930 [Urine:1400; Stool:525; Blood:5]    Physical Exam  General: Sedated, laying supine, chronically ill-appearing, morbidly obese  Pulm: Mechanically ventilated   Neck: 6 cuffed Shiley tracheostomy tube in place, secured with 4 quadrant silk sutures and soft collar. Secretions appropriate in color, amount, and smell. Interval removal of surgicel.

## 2024-04-17 NOTE — Progress Notes (Signed)
 MICU Daily Progress Note     Date of Service: 04/17/2024    Problem List:   Principal Problem:    Bacteremia, escherichia coli  Active Problems:    Alcoholic liver disease (HHS-HCC)    HTN (hypertension)    Depression with anxiety    Class 2 severe obesity with serious comorbidity in adult    Atrial fibrillation    (CMS-HCC)    Pleural effusion    Hypernatremia    Thrombocytopenia    Cirrhosis    (CMS-HCC)    A-fib (CMS-HCC)    Hepatic encephalopathy    (CMS-HCC)    Anaphylaxis      Interval history: [name] is a 78 y.o. female with HFpEF, HTN, Afib on AC, MASLD cirrhosis, originally hospitalized for scheduled TEE/DCCV (aborted d/t LAA clot) c/b AHHRF, encephalopathy and septic shock requiring MICU care, was s/p extubation transferred to [facility] for further management of anticoagulation and hypernatremia. Transferred back to [facility] MICU for closer monitoring for respiratory status, pressor requirement, GI bleed, and management of carotid artery injury.     On 10/24, patient had acute worsening of respiratory status leading requiring intubation. When patient was given sedation for intubation, she coded and promptly had ROSC after a few minutes of CPR. Subsequently, CVC placed in neck, used to draw blood, unclear if given pressors through it, then found  to be in arterial system based on blood gas and imaging, likely right carotid. Line was subsequently removed, and pressure was held for 15 minutes, hemostasis with no hematoma. She was transferred to [facility] MICU for management of this carotid injury, to be evaluated by vascular surgery while also managing her increased vasopressor requirement and respiratory distress.  Has been weaned off pressors since 10/27 at 1 PM. VBG's indicating that she is ready for extubation, however her mentation suggests that she is not able to protect her airway.  She was extubated on 10/31, and mentation slowly improved.  Unfortunately on the morning of 11/2 she had increased lethargy, increased secretions, and she was not protecting her airway.  She was reintubated on 11/2, and required pressor support with norepinephrine and vasopressin after the induction agents.  Self extubated on 11/5. Re intubated on 11/6 due to hypercarbia and increased work of breathing.  We are left with the challenging options regarding the care of [name].  Due to her multiple failed extubations, we are concerned [name] will need a tracheostomy long-term to protect her airway.  There is a meeting with palliative care and family on 04/13/2024, her family agreed for tracheostomy.  Tracheostomy placed 11/14.       Neurological   Hepatic encephalopathy  Per chart review at [facility], rapid response called on 10/24 for increased somnolence. At the time, differential included hepatic encephalopathy, toxic metabolic encephalopathy in the setting of shock (possibly sepsis), versus intracranial pathology. She was subsequently intubated. CT head on 10/24 showed no acute intracranial abnormality. Weaned off sedation with slow improvements in neurological status.  While we have continued lactulose  therapy and seeing good response with several bowel movements, her mentation remains poor.  This is concerning for severe ICU delirium.  Meeting was had with daughter, [name], on 11/5.  Described that should intubation be necessary we would likely be moving towards a tracheostomy as [name] has continuously not been protecting her airway.  As of now [name] wanted to pursue aggressive measures including repeat intubation and potential tracheostomy. Tracheostomy placed 11/14.    -Continue lactulose  and rifaximin    -  Patient has become more obtunded today, she is opening her eyes to stimulation however does not nod her head yes or no and she is not following commands.  Concerns for hepatic encephalopathy as we have been holding her lactulose  given multiple bowel movements.  - Plan is to increase lactulose  to 3 times a day  - DC Haldol  - Will check ammonia level, sepsis workup    Analgesia: No pain issues  RASS at goal? N/A, not on sedation  [place] Agitation Assessment Scale (RASS) : -1 (04/17/2024  4:00 PM)      Pulmonary   Acute Hypoxic Hypercarbic Respiratory Failure   Rapid response called on 10/24 for increased somnolence, found to have acute hypercarbic respiratory failure and subsequently intubated. Suspect hypercarbic respiratory failure due to encephalopathy as above. CXR on 10/24 showed worsening pleural effusions. Not currently getting diuresed. Given patient's hypoxia, and AMS, there was concern for infectious etiology. MRSA nares negative. Infectious workup thus far is unremarkable. SBTs show readiness extubation based on VBGs, however there is concern that she continues to be unable to protect her airway due to her current mentation.  Reintubated on 11/2 followed by therapeutic bronchoscopy.  Self extubated on 11/5.  Reintubate noted on 11/6 due to hypercarbia and not protecting her airway. Trach placed 11/14, complicated by anaphylactic reaction to rocuronium which was treated with epi, benadryl and steroids. Allergy added to chart.    -Plan is to wean off ventilator.  She tolerated PSV CPAP for 1-1/2 hours yesterday became tachypneic in the high 30s and 40s.  Plan is to see how she does with pressure support today.    Vent Mode: PRVC  FiO2 (%): 40 %  S RR: 15  S VT: 320 mL  PEEP: 5 cm H20  PR SUP: 16 cm H20      Cardiovascular   Carotid Artery Injury (resolved)  At [facility], CVC placed intended for RIJ on 10/24 following intubation, PEA, ROSC. Was used with known blood return. Unclear if it was used for pressor support, but highly likely that it was. CXR for placement check showed concern for intra aterial location, and blood gas confirmed it was arterial. Thought to be in carotid artery. CVC removed with pressure held for 15 minutes, with no hematoma formation. On arrival, no physical exam and bedside ultrasound showed no large hematoma. No active bleeding.   - Vascular Surgery consulted, stated fistula has healed based on Ultrasound findings    PEA arrest s/p ROSC  Hypovolemic/Hemorrhagic shock (resolved)  Atrial fibrillation  known left atrial appendage clot  HFpEF  Patient with PEA arrest peri-intubation with ROSC. Shock thought to be hemorrhagic in the setting of GI bleed History of atrial fibrillation with known LAA clot and HFpEF. Vasopressors were weaned off 10/27. APTT was elevated the morning of 10/28 necessitating changing anticoagulation from therapeutic heparin to therapeutic Lovenox .   - Holding Coreg   - Started heparin drip for anticoagulation for LAA clot  - Continue metoprolol  - Had to restart Levophed for hypotension, currently at 8      Renal   Hypernatremia   At [facility], had persistently hypernatremia near 153. Had been treated with D5 gtt. Sodium throughout admission has largely been 146-148. Sodium has overall normalized and continues to be within normal limits.  - Strict I/O's  - Sodium checks  -Gave a dose of lasix yesterday for concerns for fluid overload given bilateral lower extremity pitting edema  - Sodium now within range  - UOP  stable but still net positive      Infectious Disease/Autoimmune   E. Coli Bacteremia (resolved),   Initially admitted for afib, found to have E. coli bacteremia on October 10. This was treated with cefazolin . Rapid response called 10/24, patient noted to be hypothermic. WBCs jumped from 3.0 to 9.7 on 10/24. Although the most recent lab was following intubation and CPR. UA with pyuria, rare bacteria, hematuria. Peri-intubation, patient developed significant hypotension requiring initiation of NE and vasopressin. Suspect possible septic shock with unknown infectious source. Patient started on vancomycin and cefepime 10/24. CT Abdomen pelvis concerning for aspiration pneumonia. Following infectious workup was unremarkable. Antibiotics were stopped on 10/27.  Infectious workup was repeated on 11/3 due to reintubation and status post bronchoscopy.    -Finished course of ceftriaxone  - Sending infectious workup with cultures and UA given new altered mental status    Cultures:  Blood Culture, Routine (no units)   Date Value   04/09/2024 No Growth at 5 days   04/09/2024 No Growth at 5 days     Urine Culture, Comprehensive (no units)   Date Value   04/09/2024 NO GROWTH   04/04/2024 NO GROWTH     Lower Respiratory Culture (no units)   Date Value   04/10/2024 2+ Methicillin-Susceptible Staphylococcus aureus (A)   04/10/2024 1+ Oropharyngeal Flora Isolated     WBC (10*9/L)   Date Value   04/17/2024 5.2     WBC, UA (/HPF)   Date Value   04/17/2024 1       FEN/GI   Sigmoid Mass c/f Malignancy and Hematochezia (resolved)  Hemorrhagic Shock (resolved)  CT abdomen pelvis done 10/24 with evidence of cirrhosis and masslike thickening of the lower sigmoid colon concerning for malignancy.  CTA abdomen pelvis performed 1024 with active extravasation into the gastric fundus possibly secondary to traumatic enteric tube placement.  GI scoped the patient on 10/25 and clipped an area of active bleeding.  They removed a total of 1.5 L of blood.  Hemoglobin 10/20 6 AM downtrended to 6.0, so 2 units of blood transfused.  GI stated this was consistent with 10/25 scope and likely did not represent increased bleeding. Hb steadily improving with Hb of 9.2 on 10/28.   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 Given shock of unclear etiology. CTAP demonstrating cirrhosis. Likely that cirrhosis is contributing to hypotension.   - Continue lactulose ; target 3-5 BMs daily  - NGT in place, on TF and FWF  - Daily MELD labs and CMP  -Lactulose  daily, labs show elevated ammonia, concerns for hepatic encephalopathy     MELD 3.0: 15 at 04/05/2024  4:31 AM  MELD-Na: 13 at 04/05/2024  4:31 AM  Calculated from:  Serum Creatinine: 0.43 mg/dL (Using min of 1 mg/dL) at 88/09/7972  5:68 AM  Serum Sodium: 143 mmol/L (Using max of 137 mmol/L) at 04/05/2024  4:31 AM  Total Bilirubin: 1.3 mg/dL at 88/09/7972  5:68 AM  Serum Albumin: 2.4 g/dL at 88/09/7972  5:68 AM  INR(ratio): 1.61 at 04/03/2024 10:26 AM  Age at listing (hypothetical): 78 years  Sex: Female at 04/05/2024  4:31 AM    Provider Malnutrition Assessment:  Body mass index is 38.76 kg/m??. BMI Interpretation: >/= 30 and < 40, consistent with obesity,  clinically significant requiring additional resources and complicating multiple aspects of patient care.  GLIM criteria:   Pt does not meet criteria  -I have screened this patient for malnutrition and they did NOT meet criteria for malnutrition based on GLIM criteria.  -TF and FWF; Dietician consulted, appreciate assistance      Heme/Coag   Acute blood loss anemia  Hemoglobin down trended to 6.0 on the morning of 10/26 thought to be secondary to acute GI bleed that was scoped and clipped 10/25.    H/H stable  -restarted heparin per ENT    Endocrine   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)   - Continue home anastrozole     Integumentary   NAI   #  - WOCN consulted for high risk skin assessment No. Reason: Not indicated.    Prophylaxis/LDA/Restraints/Consults   ICU Checklist completed: yes (see ICU rounding navigator in Epic)    Patient Lines/Drains/Airways Status       Active Active Lines, Drains, & Airways       Name Placement date Placement time Site Days    Tracheostomy Shiley 6 Cuffed 04/15/24  1133  6  2    NG/OG Tube Feedings 10 Fr. Right nostril 04/01/24  1138  Right nostril  16    External Urinary Device 04/13/24 With Suction 04/13/24  1700  -- 4    PICC Double Lumen 04/06/24 Left Basilic 04/06/24  1558  Basilic  11                  Patient Lines/Drains/Airways Status       Active Wounds       Name Placement date Placement time Site Days    Wound 03/01/24 Other (Comment) Toe (Comment which one) Left;Other (Comment) Blister present on arrival, tx already in outpatient setting, in process of healing, surrounding erythema 03/01/24  0129  Toe (Comment which one)  47    Wound 03/13/24 Irritant Contact Dermatitis Incontinence Sacrum Mid gluteal cleft MASD? 03/13/24  --  Sacrum  35    Wound 04/15/24 Surgical Neck tracheostomy secured with sutures and trach collar ( surgicel applied 04/15/24  1144  Neck  2                  Goals of Care     Code Status:   Orders Placed This Encounter   Procedures    Full Code     Standing Status:   Standing     Number of Occurrences:   1        Designated Healthcare Decision Maker:  Ms. Porro designated healthcare decision maker(s) is/are   HCDM (patient stated preference): Jicha,[name] - Spouse - [phone]    HCDM (patient stated preference): Wesely,[name] - Daughter - [phone]. See HCDM section of Epic sidebar/storyboard or ACP tab in patient chart for details regarding active HCDMs and patient capacity for decision-making.      Subjective     Patient intubated this morning on my exam.  Opens eyes to stimulation, however does not follow commands.    Objective     Vitals - past 24 hours  Temp:  [36.7 ??C (98 ??F)-37.9 ??C (100.2 ??F)] 37.2 ??C (99 ??F)  Pulse:  [77-107] 98  SpO2 Pulse:  [73-109] 97  Resp:  [12-40] 25  BP: (86-128)/(34-76) 123/45  FiO2 (%):  [30 %-40 %] 40 %  SpO2:  [89 %-100 %] 98 % Intake/Output  I/O last 3  completed shifts:  In: 4706.3 [P.O.:120; I.V.:381.3; NG/GT:3920; IV Piggyback:285]  Out: 1850 [Urine:1600; Stool:250]     Physical Exam:    General: intubated on my examination. Trach in place. Arouses to voice, not following commands  HEENT: normocephalic, atraumatic   CV: pulses palpable, regular borderline tachycardia   Pulm: Rhonchi w transmitted upper airway sounds throughout  GI: soft, NTND, + BS  MSK: Bilateral LE pitting edema  Skin: numerous large flat violaceous ecchymosis on left arm, chest, neck bilaterally.   Neuro: Withdrawals to painful stimuli.  Not following commands    Continuous Infusions:   Infusions Meds[1]    Scheduled Medications:   Scheduled Medications[2]    PRN medications:  PRN Medications[3]    Data/Imaging Review: Reviewed in Epic and personally interpreted on 04/17/2024. See EMR for detailed results.      Waddell Mead, DO  PGY1 Emergency Medicine, [facility]                       [1]    heparin 12 Units/kg/hr (04/17/24 9176)    NORepinephrine bitartrate-NS Stopped (04/17/24 1400)   [2]    cyanocobalamin (vitamin B-12)  1,000 mcg Enteral tube: gastric Daily    esomeprazole  40 mg Enteral tube: gastric Daily    famotidine  20 mg Enteral tube: gastric BID    flu vac 2025 [id number](PF)  0.5 mL Intramuscular During hospitalization    folic acid   1 mg Enteral tube: gastric Daily    lactulose   30 g Enteral tube: gastric TID    melatonin  3 mg Oral QPM    [Provider Hold] metoPROLOL tartrate  25 mg Enteral tube: gastric BID    multivitamins (ADULT)  1 tablet Enteral tube: gastric Daily    rifAXIMin   550 mg Enteral tube: gastric BID    sodium chloride   10 mL Intravenous Q8H    sodium chloride   10 mL Intravenous Q8H    thiamine  mononitrate (vit B1)  100 mg Enteral tube: gastric Daily   [3] acetaminophen , docusate sodium, heparin (porcine)

## 2024-04-17 NOTE — Plan of Care (Signed)
 Pt has been somnolent this shift, MD aware. Mental status slightly improved this afternoon, opened eyes spontaneously for the first time. Still unable to follow commands. Weaned off levo. On 30-40% PRVC. PSV trial this afternoon but pt became tachypneic to the 40s and sats dropped to 88% after ~56min. TF at goal, getting lactulose . Purewick in place. Straight cath for UA and cx studies. Falls precautions maintained. Q2hr turns. Heparin infusion restarted this morning.     Problem: Skin Injury Risk Increased  Goal: Skin Health and Integrity  Outcome: Shift Focus  Intervention: Optimize Skin Protection  Recent Flowsheet Documentation  Taken 04/17/2024 1600 by Sebastian Rollene HERO, RN  Pressure Reduction Techniques: heels elevated off bed  Head of Bed (HOB) Positioning: HOB at 30 degrees  Pressure Reduction Devices: heel offloading device utilized  Taken 04/17/2024 1400 by Sebastian Rollene HERO, RN  Pressure Reduction Techniques: heels elevated off bed  Head of Bed (HOB) Positioning: HOB at 30 degrees  Pressure Reduction Devices: heel offloading device utilized  Taken 04/17/2024 1200 by Sebastian Rollene HERO, RN  Pressure Reduction Techniques: heels elevated off bed  Head of Bed (HOB) Positioning: HOB at 30 degrees  Pressure Reduction Devices: heel offloading device utilized  Taken 04/17/2024 1000 by Sebastian Rollene HERO, RN  Pressure Reduction Techniques: heels elevated off bed  Head of Bed (HOB) Positioning: HOB at 30 degrees  Pressure Reduction Devices: heel offloading device utilized  Taken 04/17/2024 0800 by Sebastian Rollene HERO, RN  Pressure Reduction Techniques: heels elevated off bed  Head of Bed (HOB) Positioning: HOB at 30 degrees  Pressure Reduction Devices: heel offloading device utilized     Problem: Adult Inpatient Plan of Care  Goal: Optimal Comfort and Wellbeing  Outcome: Shift Focus  Goal: Rounds/Family Conference  Outcome: Shift Focus     Problem: Self-Care Deficit  Goal: Improved Ability to Complete Activities of Daily Living  Outcome: Shift Focus

## 2024-04-18 LAB — APTT
APTT: 330 s (ref 24.8–38.4)
APTT: 106.2 s — ABNORMAL HIGH (ref 24.8–38.4)
APTT: 53 s — ABNORMAL HIGH (ref 24.8–38.4)
HEPARIN CORRELATION: 1.9
HEPARIN CORRELATION: 0.6
HEPARIN CORRELATION: 0.3

## 2024-04-18 LAB — BASIC METABOLIC PANEL
ANION GAP: 11 mmol/L (ref 5–14)
BLOOD UREA NITROGEN: 22 mg/dL (ref 9–23)
BUN / CREAT RATIO: 46
CALCIUM: 8 mg/dL — ABNORMAL LOW (ref 8.7–10.4)
CHLORIDE: 106 mmol/L (ref 98–107)
CO2: 27 mmol/L (ref 20.0–31.0)
CREATININE: 0.48 mg/dL — ABNORMAL LOW (ref 0.55–1.02)
EGFR CKD-EPI (2021) FEMALE: >90 mL/min/1.73m2 (ref >=60)
GLUCOSE RANDOM: 172 mg/dL (ref 70–179)
POTASSIUM: 3.6 mmol/L (ref 3.4–4.8)
SODIUM: 144 mmol/L (ref 135–145)

## 2024-04-18 LAB — CBC W/ AUTO DIFF
BASOPHILS ABSOLUTE COUNT: 0 10*9/L (ref 0.0–0.1)
BASOPHILS RELATIVE PERCENT: 0.5 %
EOSINOPHILS ABSOLUTE COUNT: 0.1 10*9/L (ref 0.0–0.5)
EOSINOPHILS RELATIVE PERCENT: 1.8 %
HEMATOCRIT: 24.4 % — ABNORMAL LOW (ref 34.0–44.0)
HEMOGLOBIN: 7.9 g/dL — ABNORMAL LOW (ref 11.3–14.9)
LYMPHOCYTES ABSOLUTE COUNT: 1.2 10*9/L (ref 1.1–3.6)
LYMPHOCYTES RELATIVE PERCENT: 24.6 %
MEAN CORPUSCULAR HEMOGLOBIN CONC: 32.4 g/dL (ref 32.0–36.0)
MEAN CORPUSCULAR HEMOGLOBIN: 31.8 pg (ref 25.9–32.4)
MEAN CORPUSCULAR VOLUME: 98.1 fL — ABNORMAL HIGH (ref 77.6–95.7)
MEAN PLATELET VOLUME: 9.6 fL (ref 6.8–10.7)
MONOCYTES ABSOLUTE COUNT: 0.7 10*9/L (ref 0.3–0.8)
MONOCYTES RELATIVE PERCENT: 13.3 %
NEUTROPHILS ABSOLUTE COUNT: 3 10*9/L (ref 1.8–7.8)
NEUTROPHILS RELATIVE PERCENT: 59.8 %
PLATELET COUNT: 131 10*9/L — ABNORMAL LOW (ref 150–450)
RED BLOOD CELL COUNT: 2.49 10*12/L — ABNORMAL LOW (ref 3.95–5.13)
RED CELL DISTRIBUTION WIDTH: 19.6 % — ABNORMAL HIGH (ref 12.2–15.2)
WBC ADJUSTED: 5 10*9/L (ref 3.6–11.2)

## 2024-04-18 LAB — BLOOD GAS, VENOUS
BASE EXCESS VENOUS: 4.5 — ABNORMAL HIGH (ref -2.0–2.0)
BASE EXCESS VENOUS: 2.4 — ABNORMAL HIGH (ref -2.0–2.0)
CARBOXYHEMOGLOBIN, VENOUS: 1.7 % — ABNORMAL HIGH (ref <1.2)
CARBOXYHEMOGLOBIN, VENOUS: 1.9 % — ABNORMAL HIGH (ref <1.2)
HCO3 VENOUS: 29 mmol/L — ABNORMAL HIGH (ref 22–27)
HCO3 VENOUS: 26 mmol/L (ref 22–27)
METHEMOGLOBIN, VENOUS: <1 % (ref <1.5)
METHEMOGLOBIN, VENOUS: <1 % (ref <1.5)
O2 SATURATION VENOUS: 64.4 % (ref 40.0–85.0)
O2 SATURATION VENOUS: 76.8 % (ref 40.0–85.0)
OXYHEMOGLOBIN, VENOUS: 63.1 % (ref 40.0–85.0)
OXYHEMOGLOBIN, VENOUS: 75.3 % (ref 40.0–85.0)
PCO2 VENOUS: 39 mmHg — ABNORMAL LOW (ref 40–60)
PCO2 VENOUS: 39 mmHg — ABNORMAL LOW (ref 40–60)
PH VENOUS: 7.47 — ABNORMAL HIGH (ref 7.32–7.43)
PH VENOUS: 7.45 — ABNORMAL HIGH (ref 7.32–7.43)
PO2 VENOUS: 36 mmHg (ref 30–55)
PO2 VENOUS: 45 mmHg (ref 30–55)

## 2024-04-18 LAB — CYSTATIN C
CYSTATIN C: 1.6 mg/L — ABNORMAL HIGH (ref 0.64–1.23)
EGFR CKD-EPI (2012) CYSTATIN C FEMALE: 36 mL/min/1.73m2 — ABNORMAL LOW (ref >=60)

## 2024-04-18 LAB — HEPATIC FUNCTION PANEL
ALBUMIN: 2.6 g/dL — ABNORMAL LOW (ref 3.4–5.0)
ALKALINE PHOSPHATASE: 150 U/L — ABNORMAL HIGH (ref 46–116)
ALT (SGPT): 13 U/L (ref 10–49)
AST (SGOT): 29 U/L (ref <=34)
BILIRUBIN DIRECT: 0.6 mg/dL — ABNORMAL HIGH (ref 0.00–0.30)
BILIRUBIN TOTAL: 1.1 mg/dL (ref 0.3–1.2)
PROTEIN TOTAL: 5.1 g/dL — ABNORMAL LOW (ref 5.7–8.2)

## 2024-04-18 LAB — PHOSPHORUS: PHOSPHORUS: 2.5 mg/dL (ref 2.4–5.1)

## 2024-04-18 LAB — SLIDE REVIEW

## 2024-04-18 LAB — MAGNESIUM: MAGNESIUM: 2.1 mg/dL (ref 1.6–2.6)

## 2024-04-18 MED ADMIN — sodium chloride (NS) 0.9 % flush 10 mL: 10 mL | INTRAVENOUS | @ 23:00:00

## 2024-04-18 MED ADMIN — sodium chloride (NS) 0.9 % flush 10 mL: 10 mL | INTRAVENOUS | @ 09:00:00

## 2024-04-18 MED ADMIN — sodium chloride (NS) 0.9 % flush 10 mL: 10 mL | INTRAVENOUS | @ 16:00:00

## 2024-04-18 MED ADMIN — rifAXIMin (XIFAXAN) oral suspension: 550 mg | GASTROENTERAL | @ 02:00:00 | Stop: 2025-04-11

## 2024-04-18 MED ADMIN — rifAXIMin (XIFAXAN) oral suspension: 550 mg | GASTROENTERAL | @ 13:00:00 | Stop: 2025-04-11

## 2024-04-18 MED ADMIN — lactulose oral solution: 30 g | GASTROENTERAL | @ 23:00:00

## 2024-04-18 MED ADMIN — lactulose oral solution: 30 g | GASTROENTERAL | @ 21:00:00

## 2024-04-18 MED ADMIN — folic acid (FOLVITE) tablet 1 mg: 1 mg | GASTROENTERAL | @ 13:00:00

## 2024-04-18 MED ADMIN — thiamine mononitrate (vit B1) tablet 100 mg: 100 mg | GASTROENTERAL | @ 13:00:00

## 2024-04-18 MED ADMIN — cyanocobalamin (vitamin B-12) tablet 1,000 mcg: 1000 ug | GASTROENTERAL | @ 13:00:00

## 2024-04-18 MED ADMIN — famotidine (PEPCID) tablet 20 mg: 20 mg | GASTROENTERAL | @ 13:00:00

## 2024-04-18 MED ADMIN — famotidine (PEPCID) tablet 20 mg: 20 mg | GASTROENTERAL | @ 02:00:00

## 2024-04-18 MED ADMIN — melatonin tablet 3 mg: 3 mg | GASTROENTERAL | @ 23:00:00

## 2024-04-18 MED ADMIN — esomeprazole (NEXIUM) granules 40 mg: 40 mg | GASTROENTERAL | @ 13:00:00

## 2024-04-18 MED ADMIN — multivitamins, therapeutic with minerals tablet 1 tablet: 1 | GASTROENTERAL | @ 13:00:00

## 2024-04-18 MED ADMIN — midazolam (VERSED) 1 mg/mL injection: INTRAVENOUS | @ 02:00:00 | Stop: 2024-04-17

## 2024-04-18 MED ADMIN — heparin 25,000 Units/250 mL (100 units/mL) in 0.45% saline infusion (premade): 0-24 [IU]/kg/h | INTRAVENOUS | @ 07:00:00

## 2024-04-18 MED ADMIN — midazolam (VERSED) injection 4 mg: 4 mg | INTRAVENOUS | @ 02:00:00 | Stop: 2024-04-17

## 2024-04-18 MED ADMIN — lactulose oral solution: 30 g | GASTROENTERAL | @ 02:00:00

## 2024-04-18 MED ADMIN — lactulose oral solution: 30 g | GASTROENTERAL | @ 13:00:00 | Stop: 2024-04-18

## 2024-04-18 MED ADMIN — potassium chloride 10 mEq in 100 mL IVPB: 10 meq | INTRAVENOUS | @ 15:00:00 | Stop: 2024-04-18

## 2024-04-18 MED ADMIN — lactulose oral solution: 30 g | GASTROENTERAL | @ 16:00:00 | Stop: 2024-04-18

## 2024-04-18 MED ADMIN — lactulose oral solution: 30 g | GASTROENTERAL | @ 19:00:00 | Stop: 2024-04-18

## 2024-04-18 MED ADMIN — levETIRAcetam (KEPPRA) injection 3,500 mg: 3500 mg | INTRAVENOUS | @ 21:00:00 | Stop: 2024-04-18

## 2024-04-18 MED ADMIN — potassium chloride 10 mEq in 100 mL IVPB: 10 meq | INTRAVENOUS | @ 12:00:00 | Stop: 2024-04-18

## 2024-04-18 MED ADMIN — potassium chloride 10 mEq in 100 mL IVPB: 10 meq | INTRAVENOUS | @ 16:00:00 | Stop: 2024-04-18

## 2024-04-18 MED ADMIN — NORepinephrine 8 mg in dextrose 5 % 250 mL (32 mcg/mL) infusion PMB: 0-30 ug/min | INTRAVENOUS | @ 07:00:00

## 2024-04-18 MED ADMIN — potassium chloride 10 mEq in 100 mL IVPB: 10 meq | INTRAVENOUS | @ 13:00:00 | Stop: 2024-04-18

## 2024-04-18 NOTE — Plan of Care (Signed)
 [facility]TA pt orientation. Unable to follow commands. Pt with roving gaze/left sided gaze preference on initial assessment. Stat head CT obtained. Stat EEG ordered but unable to complete this shift d/t no available EEG machines. Afib. Other VSS. Vent 30%. No evidence of pain per CPOT. Purewick in place, minimal output. FMS in place, minimal output. Lactulose  given per order. Pt remains on TF at goal, tolerating well. Q2H turns and standard precautions maintained. No falls/injuries this shift. See MAR/flowsheets for further information.    Problem: Skin Injury Risk Increased  Goal: Skin Health and Integrity  Outcome: Shift Focus  Intervention: Optimize Skin Protection  Recent Flowsheet Documentation  Taken 04/18/2024 0600 by Edsel Silvano BROCKS, RN  Head of Bed [facility]) Positioning: HOB at 30-45 degrees  Taken 04/18/2024 0400 by Edsel Silvano BROCKS, RN  Head of Bed [facility]) Positioning: HOB at 30-45 degrees  Taken 04/18/2024 0200 by Edsel Silvano BROCKS, RN  Head of Bed [facility]) Positioning: HOB at 30-45 degrees  Taken 04/18/2024 0000 by Edsel Silvano BROCKS, RN  Head of Bed [facility]) Positioning: HOB at 30-45 degrees  Taken 04/17/2024 2200 by Edsel Silvano BROCKS, RN  Head of Bed [facility]) Positioning: HOB at 30-45 degrees  Taken 04/17/2024 2000 by Edsel Silvano BROCKS, RN  Pressure Reduction Techniques:   heels elevated off bed   weight shift assistance provided  Head of Bed (HOB) Positioning: HOB at 30-45 degrees  Pressure Reduction Devices:   heel offloading device utilized   positioning supports utilized  Skin Protection:   adhesive use limited   incontinence pads utilized   skin-to-device areas padded   skin-to-skin areas padded   transparent dressing maintained   tubing/devices free from skin contact     Problem: Fall Injury Risk  Goal: Absence of Fall and Fall-Related Injury  Outcome: Shift Focus  Intervention: Promote Injury-Free Environment  Recent Flowsheet Documentation  Taken 04/17/2024 2000 by Edsel Silvano BROCKS, RN  Safety Interventions: aspiration precautions   bed alarm   bleeding precautions   enteral feeding safety   fall reduction program maintained   no IV/BP/blood draw left arm   room near unit station     Problem: Comorbidity Management  Goal: Blood Pressure in Desired Range  Outcome: Shift Focus     Problem: Adult Inpatient Plan of Care  Goal: Absence of Hospital-Acquired Illness or Injury  Intervention: Identify and Manage Fall Risk  Recent Flowsheet Documentation  Taken 04/17/2024 2000 by Edsel Silvano BROCKS, RN  Safety Interventions:   aspiration precautions   bed alarm   bleeding precautions   enteral feeding safety   fall reduction program maintained   no IV/BP/blood draw left arm   room near unit station  Intervention: Prevent Skin Injury  Recent Flowsheet Documentation  Taken 04/18/2024 0600 by Edsel Silvano BROCKS, RN  Positioning for Skin: Right  Taken 04/18/2024 0400 by Edsel Silvano BROCKS, RN  Positioning for Skin: Left  Taken 04/18/2024 0200 by Edsel Silvano BROCKS, RN  Positioning for Skin: Right  Taken 04/18/2024 0000 by Edsel Silvano BROCKS, RN  Positioning for Skin: Left  Taken 04/17/2024 2200 by Edsel Silvano BROCKS, RN  Positioning for Skin: Right  Taken 04/17/2024 2000 by Edsel Silvano BROCKS, RN  Positioning for Skin: Left  Device Skin Pressure Protection:   absorbent pad utilized/changed   adhesive use limited   positioning supports utilized   pressure points protected  Skin Protection:   adhesive use limited   incontinence pads utilized   skin-to-device areas padded   skin-to-skin areas  padded   transparent dressing maintained   tubing/devices free from skin contact  Intervention: Prevent Infection  Recent Flowsheet Documentation  Taken 04/17/2024 2000 by Edsel Silvano BROCKS, RN  Infection Prevention:   hand hygiene promoted   personal protective equipment utilized   rest/sleep promoted   single patient room provided     Problem: Breathing Pattern Ineffective  Goal: Effective Breathing Pattern  Intervention: Promote Improved Breathing Pattern  Recent Flowsheet Documentation  Taken 04/18/2024 0600 by Edsel Silvano BROCKS, RN  Head of Bed [facility]) Positioning: HOB at 30-45 degrees  Taken 04/18/2024 0400 by Edsel Silvano BROCKS, RN  Head of Bed [facility]) Positioning: HOB at 30-45 degrees  Taken 04/18/2024 0200 by Edsel Silvano BROCKS, RN  Head of Bed [facility]) Positioning: HOB at 30-45 degrees  Taken 04/18/2024 0000 by Edsel Silvano BROCKS, RN  Head of Bed [facility]) Positioning: HOB at 30-45 degrees  Taken 04/17/2024 2200 by Edsel Silvano BROCKS, RN  Head of Bed [facility]) Positioning: HOB at 30-45 degrees  Taken 04/17/2024 2000 by Edsel Silvano BROCKS, RN  Head of Bed [facility]) Positioning: HOB at 30-45 degrees     Problem: Gas Exchange Impaired  Goal: Optimal Gas Exchange  Intervention: Optimize Oxygenation and Ventilation  Recent Flowsheet Documentation  Taken 04/18/2024 0600 by Edsel Silvano BROCKS, RN  Head of Bed [facility]) Positioning: HOB at 30-45 degrees  Taken 04/18/2024 0400 by Edsel Silvano BROCKS, RN  Head of Bed [facility]) Positioning: HOB at 30-45 degrees  Taken 04/18/2024 0200 by Edsel Silvano BROCKS, RN  Head of Bed [facility]) Positioning: HOB at 30-45 degrees  Taken 04/18/2024 0000 by Edsel Silvano BROCKS, RN  Head of Bed [facility]) Positioning: HOB at 30-45 degrees  Taken 04/17/2024 2200 by Edsel Silvano BROCKS, RN  Head of Bed [facility]) Positioning: HOB at 30-45 degrees  Taken 04/17/2024 2000 by Edsel Silvano BROCKS, RN  Head of Bed [facility]) Positioning: HOB at 30-45 degrees     Problem: Non-Violent Restraints  Intervention: Utilize least restrictive measures  Recent Flowsheet Documentation  Taken 04/18/2024 0600 by Edsel Silvano BROCKS, RN  Less Restrictive Alternative: 1:1 patient care  Taken 04/18/2024 0400 by Edsel Silvano BROCKS, RN  Less Restrictive Alternative: 1:1 patient care  Taken 04/18/2024 0200 by Edsel Silvano BROCKS, RN  Less Restrictive Alternative: 1:1 patient care  Taken 04/18/2024 0000 by Edsel Silvano BROCKS, RN  Less Restrictive Alternative: Repositioning  Taken 04/17/2024 2226 by Edsel Silvano BROCKS, RN  Less Restrictive Alternative: Repositioning  Taken 04/17/2024 2200 by Edsel Silvano BROCKS, RN  Less Restrictive Alternative: Repositioning  Taken 04/17/2024 2000 by Edsel Silvano BROCKS, RN  Less Restrictive Alternative: Repositioning  Intervention: Patient Monitoring  Recent Flowsheet Documentation  Taken 04/18/2024 0600 by Edsel Silvano BROCKS, RN  Psychological Status/Visual Check: Subdued  Circulation/Skin Integrity: No signs of injury  Range of Motion: Performed  Fluids: NPO  Food/Meal: Enteral feeding/TPN  Elimination: Urinary catheter  Taken 04/18/2024 0400 by Edsel Silvano BROCKS, RN  Psychological Status/Visual Check: Subdued  Circulation/Skin Integrity: No signs of injury  Range of Motion: Performed  Fluids: NPO  Food/Meal: Enteral feeding/TPN  Elimination: Urinary catheter  Taken 04/18/2024 0200 by Edsel Silvano BROCKS, RN  Psychological Status/Visual Check: Subdued  Circulation/Skin Integrity: No signs of injury  Range of Motion: Performed  Fluids: NPO  Food/Meal: Enteral feeding/TPN  Elimination: Urinary catheter  Taken 04/18/2024 0000 by Edsel Silvano BROCKS, RN  Psychological Status/Visual Check: Subdued  Circulation/Skin Integrity: No signs of injury  Range of Motion: Performed  Fluids: NPO  Food/Meal: Enteral  feeding/TPN  Elimination: (FMS) Urinary catheter  Taken 04/17/2024 2226 by Edsel Silvano BROCKS, RN  Psychological Status/Visual Check: Subdued  Circulation/Skin Integrity: No signs of injury  Range of Motion: Performed  Fluids: NPO  Food/Meal: Enteral feeding/TPN  Elimination: (FMS) Urinary catheter  Taken 04/17/2024 2200 by Edsel Silvano BROCKS, RN  Psychological Status/Visual Check: Subdued  Circulation/Skin Integrity: No signs of injury  Range of Motion: Performed  Fluids: NPO  Food/Meal: Enteral feeding/TPN  Elimination: (FMS) Urinary catheter  Taken 04/17/2024 2000 by Edsel Silvano BROCKS, RN  Psychological Status/Visual Check: Subdued  Circulation/Skin Integrity: No signs of injury  Range of Motion: Performed  Fluids: NPO  Food/Meal: Enteral feeding/TPN  Elimination: (FMS) Urinary catheter  Intervention: Patient Education  Recent Flowsheet Documentation  Taken 04/17/2024 2226 by Edsel Silvano BROCKS, RN  Criteria Explained: Yes  Patient's Response: Patient Unable to respond  Family Notification: Other     Problem: Wound  Goal: Absence of Infection Signs and Symptoms  Intervention: Prevent or Manage Infection  Recent Flowsheet Documentation  Taken 04/17/2024 2000 by Edsel Silvano BROCKS, RN  Infection Management: aseptic technique maintained  Goal: Skin Health and Integrity  Intervention: Optimize Skin Protection  Recent Flowsheet Documentation  Taken 04/18/2024 0600 by Edsel Silvano BROCKS, RN  Head of Bed [facility]) Positioning: HOB at 30-45 degrees  Taken 04/18/2024 0400 by Edsel Silvano BROCKS, RN  Head of Bed [facility]) Positioning: HOB at 30-45 degrees  Taken 04/18/2024 0200 by Edsel Silvano BROCKS, RN  Head of Bed [facility]) Positioning: HOB at 30-45 degrees  Taken 04/18/2024 0000 by Edsel Silvano BROCKS, RN  Head of Bed [facility]) Positioning: HOB at 30-45 degrees  Taken 04/17/2024 2200 by Edsel Silvano BROCKS, RN  Head of Bed [facility]) Positioning: HOB at 30-45 degrees  Taken 04/17/2024 2000 by Edsel Silvano BROCKS, RN  Pressure Reduction Techniques:   heels elevated off bed   weight shift assistance provided  Head of Bed (HOB) Positioning: HOB at 30-45 degrees  Pressure Reduction Devices:   heel offloading device utilized   positioning supports utilized  Skin Protection:   adhesive use limited   incontinence pads utilized   skin-to-device areas padded   skin-to-skin areas padded   transparent dressing maintained   tubing/devices free from skin contact     Problem: Mechanical Ventilation Invasive  Goal: Optimal Device Function  Intervention: Optimize Device Care and Function  Recent Flowsheet Documentation  Taken 04/18/2024 0400 by Edsel Silvano BROCKS, RN  Oral Care:   mouth swabbed   suction provided  Taken 04/18/2024 0000 by Edsel Silvano BROCKS, RN  Oral Care:   mouth swabbed   suction provided  Taken 04/17/2024 2000 by Edsel Silvano BROCKS, RN  Oral Care:   mouth swabbed   suction provided   teeth brushed  Goal: Absence of Device-Related Skin and Tissue Injury  Intervention: Maintain Skin and Tissue Health  Recent Flowsheet Documentation  Taken 04/17/2024 2000 by Edsel Silvano BROCKS, RN  Device Skin Pressure Protection:   absorbent pad utilized/changed   adhesive use limited   positioning supports utilized   pressure points protected  Goal: Absence of Ventilator-Induced Lung Injury  Intervention: Prevent Ventilator-Associated Pneumonia  Recent Flowsheet Documentation  Taken 04/18/2024 0600 by Edsel Silvano BROCKS, RN  Head of Bed [facility]) Positioning: HOB at 30-45 degrees  Taken 04/18/2024 0400 by Edsel Silvano BROCKS, RN  Head of Bed [facility]) Positioning: HOB at 30-45 degrees  Oral Care:   mouth swabbed   suction provided  Taken 04/18/2024 0200 by Edsel Silvano  C, RN  Head of Bed [facility]) Positioning: HOB at 30-45 degrees  Taken 04/18/2024 0000 by Edsel Silvano BROCKS, RN  Head of Bed [facility]) Positioning: HOB at 30-45 degrees  Oral Care:   mouth swabbed   suction provided  Taken 04/17/2024 2200 by Edsel Silvano BROCKS, RN  Head of Bed [facility]) Positioning: HOB at 30-45 degrees  Taken 04/17/2024 2000 by Edsel Silvano BROCKS, RN  Head of Bed [facility]) Positioning: HOB at 30-45 degrees  Oral Care:   mouth swabbed   suction provided   teeth brushed     Problem: Mechanical Ventilation Invasive  Goal: Optimal Device Function  Intervention: Optimize Device Care and Function  Recent Flowsheet Documentation  Taken 04/18/2024 0400 by Edsel Silvano BROCKS, RN  Oral Care:   mouth swabbed   suction provided  Taken 04/18/2024 0000 by Edsel Silvano BROCKS, RN  Oral Care:   mouth swabbed   suction provided  Taken 04/17/2024 2000 by Edsel Silvano BROCKS, RN  Oral Care:   mouth swabbed   suction provided   teeth brushed  Goal: Absence of Device-Related Skin and Tissue Injury  Intervention: Maintain Skin and Tissue Health  Recent Flowsheet Documentation  Taken 04/17/2024 2000 by Edsel Silvano BROCKS, RN  Device Skin Pressure Protection:   absorbent pad utilized/changed   adhesive use limited   positioning supports utilized   pressure points protected  Goal: Absence of Ventilator-Induced Lung Injury  Intervention: Prevent Ventilator-Associated Pneumonia  Recent Flowsheet Documentation  Taken 04/18/2024 0600 by Edsel Silvano BROCKS, RN  Head of Bed [facility]) Positioning: HOB at 30-45 degrees  Taken 04/18/2024 0400 by Edsel Silvano BROCKS, RN  Head of Bed [facility]) Positioning: HOB at 30-45 degrees  Oral Care:   mouth swabbed   suction provided  Taken 04/18/2024 0200 by Edsel Silvano BROCKS, RN  Head of Bed [facility]) Positioning: HOB at 30-45 degrees  Taken 04/18/2024 0000 by Edsel Silvano BROCKS, RN  Head of Bed [facility]) Positioning: HOB at 30-45 degrees  Oral Care:   mouth swabbed   suction provided  Taken 04/17/2024 2200 by Edsel Silvano BROCKS, RN  Head of Bed [facility]) Positioning: HOB at 30-45 degrees  Taken 04/17/2024 2000 by Edsel Silvano BROCKS, RN  Head of Bed [facility]) Positioning: HOB at 30-45 degrees  Oral Care:   mouth swabbed   suction provided   teeth brushed     Problem: Mechanical Ventilation Invasive  Goal: Optimal Device Function  Intervention: Optimize Device Care and Function  Recent Flowsheet Documentation  Taken 04/18/2024 0400 by Edsel Silvano BROCKS, RN  Oral Care:   mouth swabbed   suction provided  Taken 04/18/2024 0000 by Edsel Silvano BROCKS, RN  Oral Care:   mouth swabbed   suction provided  Taken 04/17/2024 2000 by Edsel Silvano BROCKS, RN  Oral Care:   mouth swabbed   suction provided   teeth brushed  Goal: Absence of Device-Related Skin and Tissue Injury  Intervention: Maintain Skin and Tissue Health  Recent Flowsheet Documentation  Taken 04/17/2024 2000 by Edsel Silvano BROCKS, RN  Device Skin Pressure Protection:   absorbent pad utilized/changed   adhesive use limited   positioning supports utilized   pressure points protected  Goal: Absence of Ventilator-Induced Lung Injury  Intervention: Prevent Ventilator-Associated Pneumonia  Recent Flowsheet Documentation  Taken 04/18/2024 0600 by Edsel Silvano BROCKS, RN  Head of Bed [facility]) Positioning: HOB at 30-45 degrees  Taken 04/18/2024 0400 by Edsel Silvano BROCKS, RN  Head of Bed [facility]) Positioning: HOB at 30-45 degrees  Oral Care:   mouth swabbed   suction provided  Taken 04/18/2024 0200 by Edsel Silvano BROCKS, RN  Head of Bed [facility]) Positioning: HOB at 30-45 degrees  Taken 04/18/2024 0000 by Edsel Silvano BROCKS, RN  Head of Bed [facility]) Positioning: HOB at 30-45 degrees  Oral Care:   mouth swabbed   suction provided  Taken 04/17/2024 2200 by Edsel Silvano BROCKS, RN  Head of Bed [facility]) Positioning: HOB at 30-45 degrees  Taken 04/17/2024 2000 by Edsel Silvano BROCKS, RN  Head of Bed [facility]) Positioning: HOB at 30-45 degrees  Oral Care:   mouth swabbed   suction provided   teeth brushed     Problem: Mechanical Ventilation Invasive  Goal: Optimal Device Function  Intervention: Optimize Device Care and Function  Recent Flowsheet Documentation  Taken 04/18/2024 0400 by Edsel Silvano BROCKS, RN  Oral Care:   mouth swabbed   suction provided  Taken 04/18/2024 0000 by Edsel Silvano BROCKS, RN  Oral Care:   mouth swabbed   suction provided  Taken 04/17/2024 2000 by Edsel Silvano BROCKS, RN  Oral Care:   mouth swabbed   suction provided   teeth brushed  Goal: Absence of Device-Related Skin and Tissue Injury  Intervention: Maintain Skin and Tissue Health  Recent Flowsheet Documentation  Taken 04/17/2024 2000 by Edsel Silvano BROCKS, RN  Device Skin Pressure Protection:   absorbent pad utilized/changed   adhesive use limited   positioning supports utilized   pressure points protected  Goal: Absence of Ventilator-Induced Lung Injury  Intervention: Prevent Ventilator-Associated Pneumonia  Recent Flowsheet Documentation  Taken 04/18/2024 0600 by Edsel Silvano BROCKS, RN  Head of Bed [facility]) Positioning: HOB at 30-45 degrees  Taken 04/18/2024 0400 by Edsel Silvano BROCKS, RN  Head of Bed [facility]) Positioning: HOB at 30-45 degrees  Oral Care:   mouth swabbed   suction provided  Taken 04/18/2024 0200 by Edsel Silvano BROCKS, RN  Head of Bed [facility]) Positioning: HOB at 30-45 degrees  Taken 04/18/2024 0000 by Edsel Silvano BROCKS, RN  Head of Bed [facility]) Positioning: HOB at 30-45 degrees  Oral Care:   mouth swabbed   suction provided  Taken 04/17/2024 2200 by Edsel Silvano BROCKS, RN  Head of Bed [facility]) Positioning: HOB at 30-45 degrees  Taken 04/17/2024 2000 by Edsel Silvano BROCKS, RN  Head of Bed [facility]) Positioning: HOB at 30-45 degrees  Oral Care:   mouth swabbed   suction provided   teeth brushed     Problem: Infection  Goal: Absence of Infection Signs and Symptoms  Intervention: Prevent or Manage Infection  Recent Flowsheet Documentation  Taken 04/17/2024 2000 by Edsel Silvano BROCKS, RN  Infection Management: aseptic technique maintained     Problem: Mechanical Ventilation Invasive  Goal: Optimal Device Function  Intervention: Optimize Device Care and Function  Recent Flowsheet Documentation  Taken 04/18/2024 0400 by Edsel Silvano BROCKS, RN  Oral Care:   mouth swabbed   suction provided  Taken 04/18/2024 0000 by Edsel Silvano BROCKS, RN  Oral Care:   mouth swabbed   suction provided  Taken 04/17/2024 2000 by Edsel Silvano BROCKS, RN  Oral Care:   mouth swabbed   suction provided   teeth brushed  Goal: Absence of Device-Related Skin and Tissue Injury  Intervention: Maintain Skin and Tissue Health  Recent Flowsheet Documentation  Taken 04/17/2024 2000 by Edsel Silvano BROCKS, RN  Device Skin Pressure Protection:   absorbent pad utilized/changed   adhesive use limited   positioning supports utilized   pressure  points protected  Goal: Absence of Ventilator-Induced Lung Injury  Intervention: Prevent Ventilator-Associated Pneumonia  Recent Flowsheet Documentation  Taken 04/18/2024 0600 by Edsel Silvano BROCKS, RN  Head of Bed [facility]) Positioning: HOB at 30-45 degrees  Taken 04/18/2024 0400 by Edsel Silvano BROCKS, RN  Head of Bed [facility]) Positioning: HOB at 30-45 degrees  Oral Care:   mouth swabbed   suction provided  Taken 04/18/2024 0200 by Edsel Silvano BROCKS, RN  Head of Bed [facility]) Positioning: HOB at 30-45 degrees  Taken 04/18/2024 0000 by Edsel Silvano BROCKS, RN  Head of Bed [facility]) Positioning: HOB at 30-45 degrees  Oral Care:   mouth swabbed   suction provided  Taken 04/17/2024 2200 by Edsel Silvano BROCKS, RN  Head of Bed [facility]) Positioning: HOB at 30-45 degrees  Taken 04/17/2024 2000 by Edsel Silvano BROCKS, RN  Head of Bed [facility]) Positioning: HOB at 30-45 degrees  Oral Care:   mouth swabbed   suction provided   teeth brushed     Problem: Mechanical Ventilation Invasive  Goal: Optimal Device Function  Intervention: Optimize Device Care and Function  Recent Flowsheet Documentation  Taken 04/18/2024 0400 by Edsel Silvano BROCKS, RN  Oral Care:   mouth swabbed   suction provided  Taken 04/18/2024 0000 by Edsel Silvano BROCKS, RN  Oral Care:   mouth swabbed   suction provided  Taken 04/17/2024 2000 by Edsel Silvano BROCKS, RN  Oral Care:   mouth swabbed   suction provided   teeth brushed  Goal: Absence of Device-Related Skin and Tissue Injury  Intervention: Maintain Skin and Tissue Health  Recent Flowsheet Documentation  Taken 04/17/2024 2000 by Edsel Silvano BROCKS, RN  Device Skin Pressure Protection:   absorbent pad utilized/changed   adhesive use limited   positioning supports utilized   pressure points protected  Goal: Absence of Ventilator-Induced Lung Injury  Intervention: Prevent Ventilator-Associated Pneumonia  Recent Flowsheet Documentation  Taken 04/18/2024 0600 by Edsel Silvano BROCKS, RN  Head of Bed [facility]) Positioning: HOB at 30-45 degrees  Taken 04/18/2024 0400 by Edsel Silvano BROCKS, RN  Head of Bed [facility]) Positioning: HOB at 30-45 degrees  Oral Care:   mouth swabbed   suction provided  Taken 04/18/2024 0200 by Edsel Silvano BROCKS, RN  Head of Bed [facility]) Positioning: HOB at 30-45 degrees  Taken 04/18/2024 0000 by Edsel Silvano BROCKS, RN  Head of Bed [facility]) Positioning: HOB at 30-45 degrees  Oral Care:   mouth swabbed   suction provided  Taken 04/17/2024 2200 by Edsel Silvano BROCKS, RN  Head of Bed [facility]) Positioning: HOB at 30-45 degrees  Taken 04/17/2024 2000 by Edsel Silvano BROCKS, RN  Head of Bed [facility]) Positioning: HOB at 30-45 degrees  Oral Care:   mouth swabbed   suction provided   teeth brushed     Problem: Artificial Airway  Goal: Optimal Device Function  Intervention: Optimize Device Care and Function  Recent Flowsheet Documentation  Taken 04/18/2024 0400 by Edsel Silvano BROCKS, RN  Oral Care:   mouth swabbed   suction provided  Taken 04/18/2024 0000 by Edsel Silvano BROCKS, RN  Oral Care:   mouth swabbed   suction provided  Taken 04/17/2024 2000 by Edsel Silvano BROCKS, RN  Aspiration Precautions:   oral hygiene care promoted   respiratory status monitored   tube feeding placement verified   upright posture maintained  Oral Care:   mouth swabbed   suction provided   teeth brushed  Goal: Absence of Device-Related Skin or Tissue Injury  Intervention: Maintain Skin and Tissue Health  Recent Flowsheet Documentation  Taken 04/17/2024 2000 by Edsel Silvano BROCKS, RN  Device Skin Pressure Protection:   absorbent pad utilized/changed   adhesive use limited   positioning supports utilized   pressure points protected

## 2024-04-18 NOTE — Progress Notes (Signed)
 Adult Nutrition Progress Note    Visit Type: Follow-Up  Reason for Visit: Enteral Nutrition    NUTRITION INTERVENTIONS and RECOMMENDATION     Tube feeds:   Recommend Nutren 2.0 at goal rate 35 mL/hr.   This provides 1470 kcals, 62 g protein, 159 g carbohydrate, 68 g fat, 0 g fiber, 508 mL free water , and meets 98% USRDI.  Prosource 2x BID --> 240 kcal, 60 g protein   Total nutrition: 1710 kcal, 122 g protein   FWF: Increase to 200 mL q2hr to account for goal increased output through FMS   RD ordered Vitamin D lab, given ongoing high phos needs   Carries severe malnutrition dx - needing ongoing K/phos repletion   Weekly weights    NUTRITION ASSESSMENT     Patient appropriate for enteral nutrition support given mechanical ventilation.   Lactulose  being titrated for goal of 1500 mL output per day - FMS in place  Increased FWF given goal to increase output     NUTRITIONALLY RELEVANT DATA     HPI & PMH:   [name] is a 78 y.o. female who is presenting to [facility] with Bacteremia, escherichia coli, in the setting of the following pertinent/contributing co-morbidities:  HFpEF, HTN, Cirrhosis, Breast Cancer s/p Partial Mastectomy/Radiation.     Nutrition Progress:   Continues to tolerate tube feeds at goal. Continue with fluid restricted formula help with volume overload - ongoing difficulties with fluid status in the setting of cirrhosis & intravascular volume depletion.   Pt currently receiving Nutren 2.0 at goal rate 35 mL/hr. Per pump check, pt received 2042 mL x 72 hours, 93% ordered volume.   FMS remains in place (425, 100, 0, 100 mL) outputs past 4 days, decreased output from previous. Team goal of 1500 mL output       Medications:  Nutritionally pertinent medications reviewed and evaluated for potential food and/or medication interactions.   B12, pepcid, folic acid , folvite , norepi (paused), multivitamin, 30 mmol kphos, thiamine  100 mg      Labs:   Nutritionally pertinent labs reviewed.   Na 144 (previously elevated)   Phos 2.5 (previously low)         Nutritional Needs:   Daily Estimated Nutrient Needs:  Energy: 1650-1925 kcals 30-35 kcal/kg using ideal body weight, 55 kg (04/18/24 1505)]  Protein: >109 gm [> 2 gm/kg (per ASPEN/SCCM guidelines for the critically ill obese, BMI 30-39.9) using ideal body weight, 55 kg (04/18/24 1505)]  Carbohydrate:   [45-60% of kcal]  Fluid:   mL [per MD team]    Compared to Adjusted PSU Equation for Ventillated Patients (BMI >/=30, Age >/= 60)  Tmax: 37.7 , Ve: 8.4  Energy: 1716 kcals/day (11/17)    Anthropometric Data:  Height: 162.6 cm (5' 4.02)   Admission weight: 87 kg (191 lb 12.8 oz)  Last recorded weight: (!) 102.5 kg (225 lb 15.5 oz) (04/15/24)  IBW: 54.53 kg  BMI: Body mass index is 38.77 kg/m??.   Usual Body Weight: Unable to obtain at this time   Weight Assessment: per below - trending down pta, though pt with cirrhosis & currently unknown dry wt. Increase over admit likely influenced by fluid - pt noted with 3+ pitting edema bilateral upper and lower extremities     Wt Readings from Last 10 Encounters:   04/15/24 (!) 102.5 kg (225 lb 15.5 oz)   03/07/24 89.3 kg (196 lb 12.8 oz)   03/03/24 89.1 kg (196 lb 6.4 oz)  04/23/23 96.6 kg (213 lb)   03/05/23 100.2 kg (220 lb 14.4 oz)   11/27/22 (!) 102.5 kg (225 lb 14.4 oz)   10/20/22 (!) 108 kg (238 lb)   07/17/22 (!) 108 kg (238 lb)   06/09/22 (!) 103.2 kg (227 lb 8 oz)   05/08/22 (!) 105.8 kg (233 lb 3.2 oz)     Malnutrition Assessment:  Malnutrition Assessment using AND/ASPEN or GLIM Clinical Characteristics:  Patient does not meet AND/ASPEN criteria for malnutrition at this time (03/16/24 1326)       Nutrition Focused Physical Exam:      Nutrition Evaluation  Overall Impressions: Nutrition-Focused Physical Exam not indicated due to lack of malnutrition risk factors. (03/16/24 1326)     Care plan:  Patient does not meet malnutrition criteria     Current Nutrition:  NPO with OG tube in place   Nutrition Orders Nutren 2.0 continuous tube feed starting at 11/15 1400    NPO No Exceptions; Operating room: NPO starting at 11/14 0611    Separate Enteral Additives ProSource NoCarb (Protein Modular/Sugarfree); Quantity: 2 pkts BID starting at 11/04 2000          Nutritionally Pertinent Allergies, Intolerances, Sensitivities, and/or Cultural/Religious Restrictions:  none identified at this time     GOALS and EVALUATION     Patient to meet 80% or greater of nutritional needs via enteral nutrition while remains on tube feeds. - Meeting/Ongoing    Motivation, Barriers, and Compliance:  Evaluation of motivation, barriers, and compliance pending at this time due to clinical status.     Discharge Planning:   Monitor for potential discharge needs with multi-disciplinary team.          Follow-Up Parameters:   1-2 times per week (and more frequent as indicated)      Duwaine Ship, Dietetic Intern

## 2024-04-18 NOTE — Progress Notes (Signed)
 OTOLARYNGOLOGY HEAD & NECK SURGERY- PROGRESS NOTE    Assessment/Plan:  78 year old F with history of HFpEF, HTN, Afib on AC, MASLD cirrhosis, originally hospitalized for scheduled TEE/DCCV complicated by chronic respiratory failure requiring prolonged intubation. Now s/p tracheostomy 11/14. 3 Days Post-Op     - Ok to restart heparin gtt from an ENT standpoint   - Surgicel removed 11/16  - Keep obturator at head of bed. Please keep extra 6 cuffed, 4 cuffed, 6 cuffless, and 4 cuffless tracheostomy tubes at bedside.  - Suction trach as ordered.  - Please keep mepilex under inferior flange of trach to prevent wound breakdown from pressure injury.   - Trach secured with 4 quadrant sutures and soft collar. Please do not cut trach ties or sutures.  - ENT to perform first trach change. Anticipate POD 5-7 or after weaned off ventilator.   - Wean from ventilator per primary team. Please involve the trach team and respiratory therapy in tracheostomy care.   - Please teach patient and family trach care when appropriate.  - ENT will continue to follow. Please page with questions or concerns.       Principal Problem:    Bacteremia, escherichia coli  Active Problems:    Alcoholic liver disease (HHS-HCC)    HTN (hypertension)    Depression with anxiety    Class 2 severe obesity with serious comorbidity in adult    Atrial fibrillation    (CMS-HCC)    Pleural effusion    Hypernatremia    Thrombocytopenia    Cirrhosis    (CMS-HCC)    A-fib (CMS-HCC)    Hepatic encephalopathy    (CMS-HCC)    Anaphylaxis       LOS: 39 days       Subjective:  NAEO, AF, HDS on vent. Currently on 30%/5L. No desats.  No bleeding from the trach or stoma.     Objective:     Vital signs in last 24 hours:  Temp:  [36.7 ??C (98.1 ??F)-37.7 ??C (99.9 ??F)] 37.7 ??C (99.9 ??F)  Pulse:  [83-107] 99  SpO2 Pulse:  [79-109] 98  Resp:  [12-40] 30  BP: (95-138)/(32-76) 126/42  MAP (mmHg):  [53-80] 66  FiO2 (%):  [30 %-40 %] 30 %  SpO2:  [89 %-99 %] 98 %    Intake/Output last 24 hours:  I/O last 3 completed shifts:  In: 4019.4 [P.O.:180; I.V.:649.4; NG/GT:3090; IV Piggyback:100]  Out: 925 [Urine:925]    Physical Exam  General: Sedated, laying supine, chronically ill-appearing, morbidly obese  Pulm: Mechanically ventilated   Neck: 6 cuffed Shiley tracheostomy tube in place, secured with 4 quadrant silk sutures and soft collar. Secretions appropriate in color, amount, and smell.

## 2024-04-18 NOTE — Procedures (Signed)
 DAILY PRELIMINARY INTERIM LONGTERM VIDEO EEG MONITORING NOTE    Identifying Information   NAME: [name]    MRN: [id number]   DOB: [birth year]    Ordering Provider:  Velma Hamilton Deen   LOC: [id number]/4326-01    Study Information  EEG Start: 04/18/24 at 1700  EEG End: 11/**/2025 at TBD        HISTORY: 78 y.o. female with history of HFpEF, afib, and MASLD cirrhosis admitted with respiratory failure, hepatic encephalopathy, and septic shock course with course c/b hypernatremia. Consult for c/f seizure.   MRI brain 11/16. -Diffuse cortical restricted diffusion greatest within the bilateral parieto-occipital regions.     INDICATION:  Evaluate for Electrographic Seizures and Seizures     PATIENT STATE: Somnolent    EEG TECHNICAL DESCRIPTION   Conditions of Recording:  Continuous EEG with simultaneous video recording was performed utilizing 21 active electrodes placed according to the international 10-20 system.  The study was recorded digitally with a bandpass of 1-70Hz  and a sampling rate of 200Hz  and was reviewed with the possibility of multiple reformatting.  The study was digitally processed with potential spike and seizure events identified for physician analysis and review.  Patient recognized events were identified by a push button marker and reviewed by the physician. Simultaneous video was reviewed for all patient events.    DAY 1 EEG DESCRIPTION: 11/16725  at 1700 to 04/19/2024 at 0430 Winfield Nora, MD)   Disconnected from   Relevant Medications: levetiracetam (Keppra) 750 mg BID  Loaded with Keppra @1620   Versed  give @2009     Background:  Background is continuous initially without any clear PDR  There was a loss of background organization. Instead, the background was characterized by normal (>20 to <150 uV) diffuse 2-5 hz theta delta slowing. There was reactivity to stimulation.    There was not transition to sleep  .  From 6 pm onwards the background became more discontinuous with intermittent periodic discharge -->burst attenuation >suppression lasting from 2-10 seconds.  The burst were posterior dominant high amplitude sharply contoured wave with faster activity lasting for 0.5 -1 second with inter burst interval varying from 1 -5 seconds.  Though towards the end of the study the discharges were at times like generalized periodic discharges        Interictal Epileptiform Activity:  There were bilaterally independent/genrralized continuous epileptiform periodic patterns noted in posterior quadrant at 1 Hz.The discharge were complex of moderate to high amplitude sharply contoured waves with faster activity [LPD +F]    Ictal Activity:  The  interval between periodic waves occasionally  became continuous and rhythmic with sharply contoured theta waves with superadded fast activity  with spread to anterior frontocentral chains and can occur independently on left and right side.  At times the pattern lasted less than <10 seconds suggestive of BRIEF POTENTIALLY ICTAL RHYTHMIC DISCHARGES (BIRDs)  After versed  at 2009 the background remained discontinuous,though ictal evolution was seen till 1:00 am  Electrographic seizures #1@1 :06am  The burst become continuous evolve in frequency to 2-3 Hz with intermixed faster frequency and lasts for  10 12 seconds and is better evolved on left side  No clinical features- provider was bedside  There were at least 6 electrographic seizures from left posterior quadrant from 4:00-4:30 am from left posterior quadrant without any clinical correlate           EEG SUMMARY INTERPRETATION:   This is an abnormal EEG due to:  1.Posterior quadrant seizure, on left side in  later half of the study which after 4 am became abundant Lst sz at 4:27  2 Brief potentially ictal rhythmic discharges (BIRDs) in left and right posterior quadrant  3.Continuous bilaterally independent/generalized periodic discharges  4..Occasional Burst suppression/attenuation pattern         CLINICAL CORRELATION / SUMMARY of RECORDING:   These findings are consistent with   Posterior quadrant seizures  a non-specific severe degree of encephalopathy.     Focal epileptiform potentials can be seen in patients with partial-onset seizures, but do not in themselves confirm a diagnosis of seizures or epilepsy., Lateralized periodic discharges (LPDs) can be associated with HSV encephalitis, infarction, focal seizures, and other focal or lateralized cerebral pathologies. At the very least, they indicate an elevated risk of focal onset seizures. , and Burst-suppression pattern is associated with severe encephalopathy (diffuse cerebral dysfunction) that can be caused by anoxia, sedating or ansethetic medication, or hypothermia.   Interpreting Provider: Maryfrances Nora, MD    2HELPS2B Seizure Risk Score  Clinical risk score based on EEG findings and clinical history of seizures to aid in determination of optimal duration of EEG monitoring for detection of electrographic seizures.  Score has not been validated in patients under age 23 or following cardiac arrest.    Lateralized / Bilateral Independent Periodic Discharges or Lateralized Rhythmic Delta Activity - 1 point  and Brief Ictal Appearing Rhythmic Discharges (BIRDs) - 2 points    Add 1 point if known history of epilepsy or prior clinical seizure.      Risk Group     Seizure Risk   at 72 hours   Duration of Monitoring    Seizure risk < 5%     Duration of Monitoring    Seizure risk < 2%       Low Risk,   Score = 0     3.1%   1 Hour   3.3 Hours     Medium Risk,   Score = 1     12%   12 Hours   29 Hours     High Risk,   Score = 2 or greater                >25%   >24 Hours   >30 Hours   Struck et al JAMA Neurology, January 2020    Not appropriate for EEG monitoring being performed for the following:  Treatment of status epilepticus or seizures already documented on EEG  Monitoring sedation/ burst suppression for management of intracranial pressure and/or paralyzed patients  Diagnostic evaluation of transient episodes concerning for possible seizures (spell capture)   Patients s/p cardiac arrest undergoing targeted temperature management      Amon dunker al. American Clinical Neurophysiology Society's Standardized Critical Care EEG Terminology: 2021 Version. Journal of Clinical Neurophysiology 38(1):p 1-29, January 2021.               Pre Keppra load        After 6:30 pm          At 2000      At 0430 am

## 2024-04-18 NOTE — Plan of Care (Signed)
 Pt currently on PSV 16/+5/30% tolerating well. Trach care completed during shift, airway remains secure and patent. Suctioned for small amt of white secretions. Emergency equipment available at bedside.     Problem: Mechanical Ventilation Invasive  Goal: Effective Communication  Outcome: Ongoing - Unchanged  Goal: Optimal Device Function  Outcome: Ongoing - Unchanged  Intervention: Optimize Device Care and Function  Recent Flowsheet Documentation  Taken 04/18/2024 9162 by Freeman Raguel BROCKS, RRT  Oral Care:   mouth swabbed   oral rinse provided   suction provided   teeth brushed   tongue brushed  Goal: Mechanical Ventilation Liberation  Outcome: Ongoing - Unchanged  Goal: Absence of Device-Related Skin and Tissue Injury  Outcome: Ongoing - Unchanged  Goal: Absence of Ventilator-Induced Lung Injury  Outcome: Ongoing - Unchanged  Intervention: Prevent Ventilator-Associated Pneumonia  Recent Flowsheet Documentation  Taken 04/18/2024 0837 by Freeman Raguel BROCKS, RRT  Oral Care:   mouth swabbed   oral rinse provided   suction provided   teeth brushed   tongue brushed     Problem: Artificial Airway  Goal: Effective Communication  Outcome: Ongoing - Unchanged  Goal: Optimal Device Function  Outcome: Ongoing - Unchanged  Intervention: Optimize Device Care and Function  Recent Flowsheet Documentation  Taken 04/18/2024 0837 by Freeman Raguel BROCKS, RRT  Oral Care:   mouth swabbed   oral rinse provided   suction provided   teeth brushed   tongue brushed  Goal: Absence of Device-Related Skin or Tissue Injury  Outcome: Ongoing - Unchanged

## 2024-04-18 NOTE — Plan of Care (Addendum)
 Pt remains on the vent. PS trials throughout the day. On 30% 16/5. Pt RASS -4 for majority of the day. Not following commands. No gaze preference noted. Reflexes intact. Flicker of muscle to pain. EEG started this afternoon. Keppra loading dose given. HR in Afib 70-110s. Levo paused. Afebrile. No pain noted. ~530mL uop, ~240mL stool output via FMS. Lactulose  continued. Bath/Chg complete. Family updated via phone. Heparin drip continued at 11. See flowsheets/MAR for more info.     Problem: Non-Violent Restraints  Goal: Patient will remain free of restraint events  Outcome: Shift Focus  Goal: Patient will remain free of physical injury  Outcome: Shift Focus     Problem: Comorbidity Management  Goal: Blood Pressure in Desired Range  Outcome: Shift Focus     Problem: Mechanical Ventilation Invasive  Goal: Optimal Device Function  Outcome: Shift Focus  Intervention: Optimize Device Care and Function  Recent Flowsheet Documentation  Taken 04/18/2024 0800 by Xara Paulding E, RN  Oral Care:   suction provided   mouth swabbed     Problem: Artificial Airway  Goal: Optimal Device Function  Outcome: Shift Focus  Intervention: Optimize Device Care and Function  Recent Flowsheet Documentation  Taken 04/18/2024 0800 by Debby Jessie BRAVO, RN  Aspiration Precautions:   tube feeding placement verified   respiratory status monitored   upright posture maintained  Oral Care:   suction provided   mouth swabbed     Problem: Skin Injury Risk Increased  Goal: Skin Health and Integrity  Intervention: Optimize Skin Protection  Recent Flowsheet Documentation  Taken 04/18/2024 1200 by Debby Jessie BRAVO, RN  Head of Bed [facility]) Positioning: HOB at 30-45 degrees  Taken 04/18/2024 0800 by Debby Jessie BRAVO, RN  Pressure Reduction Techniques:   frequent weight shift encouraged   heels elevated off bed   weight shift assistance provided  Head of Bed (HOB) Positioning: HOB at 30-45 degrees  Pressure Reduction Devices: pressure-redistributing mattress utilized  Skin Protection:   adhesive use limited   cleansing with dimethicone incontinence wipes     Problem: Adult Inpatient Plan of Care  Goal: Absence of Hospital-Acquired Illness or Injury  Intervention: Identify and Manage Fall Risk  Recent Flowsheet Documentation  Taken 04/18/2024 0800 by Trenee Igoe E, RN  Safety Interventions:   aspiration precautions   bariatric safety   commode/urinal/bedpan at bedside   enteral feeding safety   environmental modification   fall reduction program maintained   lighting adjusted for tasks/safety   low bed   no IV/BP/blood draw left arm   room near unit station   infection management  Intervention: Prevent Skin Injury  Recent Flowsheet Documentation  Taken 04/18/2024 1200 by Debby Jessie BRAVO, RN  Positioning for Skin: Left  Taken 04/18/2024 1000 by Debby Jessie BRAVO, RN  Positioning for Skin: Right  Taken 04/18/2024 0800 by Debby Jessie BRAVO, RN  Positioning for Skin: Left  Device Skin Pressure Protection:   absorbent pad utilized/changed   adhesive use limited  Skin Protection:   adhesive use limited   cleansing with dimethicone incontinence wipes  Intervention: Prevent and Manage VTE (Venous Thromboembolism) Risk  Recent Flowsheet Documentation  Taken 04/18/2024 1200 by Malene Blaydes E, RN  Anti-Embolism Device Status: (Heparin drip) Other (Comment)  Taken 04/18/2024 1000 by Debby Jessie BRAVO, RN  Anti-Embolism Device Status: (Heparin drip) Other (Comment)  Taken 04/18/2024 0800 by Emmalee Solivan E, RN  Anti-Embolism Device Status: (Heparin drip) Other (Comment)  Intervention: Prevent Infection  Recent Flowsheet Documentation  Taken  04/18/2024 0800 by Debby Jessie BRAVO, RN  Infection Prevention:   environmental surveillance performed   equipment surfaces disinfected   hand hygiene promoted   personal protective equipment utilized   rest/sleep promoted     Problem: Breathing Pattern Ineffective  Goal: Effective Breathing Pattern  Intervention: Promote Improved Breathing Pattern  Recent Flowsheet Documentation  Taken 04/18/2024 1200 by Debby Jessie BRAVO, RN  Head of Bed [facility]) Positioning: HOB at 30-45 degrees  Taken 04/18/2024 0800 by Debby Jessie BRAVO, RN  Head of Bed [facility]) Positioning: HOB at 30-45 degrees     Problem: Fall Injury Risk  Goal: Absence of Fall and Fall-Related Injury  Intervention: Promote Injury-Free Environment  Recent Flowsheet Documentation  Taken 04/18/2024 0800 by Jerico Grisso E, RN  Safety Interventions:   aspiration precautions   bariatric safety   commode/urinal/bedpan at bedside   enteral feeding safety   environmental modification   fall reduction program maintained   lighting adjusted for tasks/safety   low bed   no IV/BP/blood draw left arm   room near unit station   infection management     Problem: Gas Exchange Impaired  Goal: Optimal Gas Exchange  Intervention: Optimize Oxygenation and Ventilation  Recent Flowsheet Documentation  Taken 04/18/2024 1200 by Debby Jessie BRAVO, RN  Head of Bed [facility]) Positioning: HOB at 30-45 degrees  Taken 04/18/2024 0800 by Debby Jessie BRAVO, RN  Head of Bed [facility]) Positioning: HOB at 30-45 degrees     Problem: Non-Violent Restraints  Intervention: Utilize least restrictive measures  Recent Flowsheet Documentation  Taken 04/18/2024 1249 by Debby Jessie BRAVO, RN  Less Restrictive Alternative:   1:1 patient care   Repositioning  Taken 04/18/2024 1200 by Debby Jessie BRAVO, RN  Less Restrictive Alternative:   Repositioning   1:1 patient care  Taken 04/18/2024 1000 by Debby Jessie BRAVO, RN  Less Restrictive Alternative:   Repositioning   1:1 patient care  Taken 04/18/2024 0800 by Debby Jessie BRAVO, RN  Less Restrictive Alternative:   1:1 patient care   Repositioning  Intervention: Patient Monitoring  Recent Flowsheet Documentation  Taken 04/18/2024 1249 by Debby Jessie BRAVO, RN  Psychological Status/Visual Check: Subdued  Circulation/Skin Integrity: No signs of injury  Range of Motion: Performed  Fluids: NPO  Food/Meal: Enteral feeding/TPN  Elimination: Urinary catheter  Taken 04/18/2024 1200 by Debby Jessie BRAVO, RN  Psychological Status/Visual Check: Subdued  Circulation/Skin Integrity: No signs of injury  Range of Motion: Performed  Fluids: NPO  Food/Meal: Enteral feeding/TPN  Elimination: Urinary catheter  Taken 04/18/2024 1000 by Debby Jessie BRAVO, RN  Psychological Status/Visual Check: Subdued  Circulation/Skin Integrity: No signs of injury  Range of Motion: Performed  Fluids: NPO  Food/Meal: Enteral feeding/TPN  Elimination: Urinary catheter  Taken 04/18/2024 0800 by Debby Jessie BRAVO, RN  Psychological Status/Visual Check: Subdued  Circulation/Skin Integrity: No signs of injury  Range of Motion: Performed  Fluids: NPO  Food/Meal: Enteral feeding/TPN  Elimination: Urinary catheter  Intervention: Patient Education  Recent Flowsheet Documentation  Taken 04/18/2024 1249 by Debby Jessie BRAVO, RN  Criteria Explained: Yes  Patient's Response: Patient Unable to respond  Family Notification: Other     Problem: Wound  Goal: Absence of Infection Signs and Symptoms  Intervention: Prevent or Manage Infection  Recent Flowsheet Documentation  Taken 04/18/2024 0800 by Debby Jessie BRAVO, RN  Infection Management: aseptic technique maintained  Goal: Skin Health and Integrity  Intervention: Optimize Skin Protection  Recent Flowsheet Documentation  Taken 04/18/2024 1200 by Kearsten Ginther E,  RN  Head of Bed [facility]) Positioning: HOB at 30-45 degrees  Taken 04/18/2024 0800 by Debby Jessie BRAVO, RN  Pressure Reduction Techniques:   frequent weight shift encouraged   heels elevated off bed   weight shift assistance provided  Head of Bed (HOB) Positioning: HOB at 30-45 degrees  Pressure Reduction Devices: pressure-redistributing mattress utilized  Skin Protection:   adhesive use limited   cleansing with dimethicone incontinence wipes     Problem: Mechanical Ventilation Invasive  Goal: Optimal Device Function  Intervention: Optimize Device Care and Function  Recent Flowsheet Documentation  Taken 04/18/2024 0800 by Issaiah Seabrooks E, RN  Oral Care:   suction provided   mouth swabbed  Goal: Absence of Device-Related Skin and Tissue Injury  Intervention: Maintain Skin and Tissue Health  Recent Flowsheet Documentation  Taken 04/18/2024 0800 by Debby Jessie BRAVO, RN  Device Skin Pressure Protection:   absorbent pad utilized/changed   adhesive use limited  Goal: Absence of Ventilator-Induced Lung Injury  Intervention: Prevent Ventilator-Associated Pneumonia  Recent Flowsheet Documentation  Taken 04/18/2024 1200 by Debby Jessie BRAVO, RN  Head of Bed [facility]) Positioning: HOB at 30-45 degrees  Taken 04/18/2024 0800 by Debby Jessie BRAVO, RN  Head of Bed [facility]) Positioning: HOB at 30-45 degrees  Oral Care:   suction provided   mouth swabbed     Problem: Mechanical Ventilation Invasive  Goal: Optimal Device Function  Intervention: Optimize Device Care and Function  Recent Flowsheet Documentation  Taken 04/18/2024 0800 by Prabhjot Maddux E, RN  Oral Care:   suction provided   mouth swabbed  Goal: Absence of Device-Related Skin and Tissue Injury  Intervention: Maintain Skin and Tissue Health  Recent Flowsheet Documentation  Taken 04/18/2024 0800 by Debby Jessie BRAVO, RN  Device Skin Pressure Protection:   absorbent pad utilized/changed   adhesive use limited  Goal: Absence of Ventilator-Induced Lung Injury  Intervention: Prevent Ventilator-Associated Pneumonia  Recent Flowsheet Documentation  Taken 04/18/2024 1200 by Debby Jessie BRAVO, RN  Head of Bed [facility]) Positioning: HOB at 30-45 degrees  Taken 04/18/2024 0800 by Debby Jessie BRAVO, RN  Head of Bed [facility]) Positioning: HOB at 30-45 degrees  Oral Care:   suction provided   mouth swabbed     Problem: Mechanical Ventilation Invasive  Goal: Absence of Device-Related Skin and Tissue Injury  Intervention: Maintain Skin and Tissue Health  Recent Flowsheet Documentation  Taken 04/18/2024 0800 by Jalaysha Skilton E, RN  Device Skin Pressure Protection:   absorbent pad utilized/changed   adhesive use limited  Goal: Absence of Ventilator-Induced Lung Injury  Intervention: Prevent Ventilator-Associated Pneumonia  Recent Flowsheet Documentation  Taken 04/18/2024 1200 by Debby Jessie BRAVO, RN  Head of Bed [facility]) Positioning: HOB at 30-45 degrees  Taken 04/18/2024 0800 by Debby Jessie BRAVO, RN  Head of Bed [facility]) Positioning: HOB at 30-45 degrees  Oral Care:   suction provided   mouth swabbed     Problem: Mechanical Ventilation Invasive  Goal: Optimal Device Function  Intervention: Optimize Device Care and Function  Recent Flowsheet Documentation  Taken 04/18/2024 0800 by Debbera Wolken E, RN  Oral Care:   suction provided   mouth swabbed  Goal: Absence of Device-Related Skin and Tissue Injury  Intervention: Maintain Skin and Tissue Health  Recent Flowsheet Documentation  Taken 04/18/2024 0800 by Deiontae Rabel E, RN  Device Skin Pressure Protection:   absorbent pad utilized/changed   adhesive use limited  Goal: Absence of Ventilator-Induced Lung Injury  Intervention: Prevent Ventilator-Associated Pneumonia  Recent Flowsheet  Documentation  Taken 04/18/2024 1200 by Debby Jessie BRAVO, RN  Head of Bed [facility]) Positioning: HOB at 30-45 degrees  Taken 04/18/2024 0800 by Debby Jessie BRAVO, RN  Head of Bed [facility]) Positioning: HOB at 30-45 degrees  Oral Care:   suction provided   mouth swabbed     Problem: Infection  Goal: Absence of Infection Signs and Symptoms  Intervention: Prevent or Manage Infection  Recent Flowsheet Documentation  Taken 04/18/2024 0800 by Debby Jessie BRAVO, RN  Infection Management: aseptic technique maintained     Problem: Mechanical Ventilation Invasive  Goal: Optimal Device Function  Intervention: Optimize Device Care and Function  Recent Flowsheet Documentation  Taken 04/18/2024 0800 by Lavin Petteway E, RN  Oral Care:   suction provided   mouth swabbed  Goal: Absence of Device-Related Skin and Tissue Injury  Intervention: Maintain Skin and Tissue Health  Recent Flowsheet Documentation  Taken 04/18/2024 0800 by Debby Jessie BRAVO, RN  Device Skin Pressure Protection:   absorbent pad utilized/changed   adhesive use limited  Goal: Absence of Ventilator-Induced Lung Injury  Intervention: Prevent Ventilator-Associated Pneumonia  Recent Flowsheet Documentation  Taken 04/18/2024 1200 by Debby Jessie BRAVO, RN  Head of Bed [facility]) Positioning: HOB at 30-45 degrees  Taken 04/18/2024 0800 by Debby Jessie BRAVO, RN  Head of Bed [facility]) Positioning: HOB at 30-45 degrees  Oral Care:   suction provided   mouth swabbed     Problem: Mechanical Ventilation Invasive  Goal: Optimal Device Function  Intervention: Optimize Device Care and Function  Recent Flowsheet Documentation  Taken 04/18/2024 0800 by Roan Miklos E, RN  Oral Care:   suction provided   mouth swabbed  Goal: Absence of Device-Related Skin and Tissue Injury  Intervention: Maintain Skin and Tissue Health  Recent Flowsheet Documentation  Taken 04/18/2024 0800 by Tres Grzywacz E, RN  Device Skin Pressure Protection:   absorbent pad utilized/changed   adhesive use limited  Goal: Absence of Ventilator-Induced Lung Injury  Intervention: Prevent Ventilator-Associated Pneumonia  Recent Flowsheet Documentation  Taken 04/18/2024 1200 by Debby Jessie BRAVO, RN  Head of Bed [facility]) Positioning: HOB at 30-45 degrees  Taken 04/18/2024 0800 by Debby Jessie BRAVO, RN  Head of Bed [facility]) Positioning: HOB at 30-45 degrees  Oral Care:   suction provided   mouth swabbed     Problem: Artificial Airway  Goal: Absence of Device-Related Skin or Tissue Injury  Intervention: Maintain Skin and Tissue Health  Recent Flowsheet Documentation  Taken 04/18/2024 0800 by Kortez Murtagh E, RN  Device Skin Pressure Protection:   absorbent pad utilized/changed   adhesive use limited

## 2024-04-18 NOTE — Consults (Signed)
 Initial Consult Note        Requesting Attending Physician:  Morene Lang Early, MD  Service Requesting Consult: Medical ICU (MDI)     Assessment and Plan          [name] is a 78 y.o. female pmhx HFpEF, afib, and MASLD cirrhosis admitted with respiratory failure, hepatic encephalopathy, and septic shock course with course c/b hypernatremia on whom I have been asked by Morene Lang Early, MD to consult for c/f seizure.    # Metabolic encephalopathy   # Roving eye movements  Consulted for concern for seizure described as roving horizontal nonrhythmic eye movements lasting several hours in duration. Movements have begun in the context of progressively declining mental status over the last 3 days. CT head obtained was unremarkable. On neurologic exam patient is unresponsive and does not move her extremities spontaneously or to noxious stimuli (triple flexes in BLEs), but has predominantly intact brainstem reflexes (only absent corneals).     Semiology of movements in question is not consistent with seizure. Roving eye movements are a sign of diffuse cortical dysfunction with preserved brainstem function, most often seen in encephalopathic or comatose patients. With consideration to patient's gradually worsening mental status while lactulose  had been recently held, highest suspicion is for a metabolic hepatic encephalopathy. Ammonia obtained on 11/16 was 291. Recommend continued management of hepatic encephalopathy per MICU team. Can broaden workup with MRI brain w/wo and cvEEG if symptoms do not improve despite escalation of therapy.     # Bilateral upper extremity hyperreflexia  On exam noted to have hyperreflexia in bilateral upper extremities with positive hoffman's and pectoralis reflexes, while jaw jerk reflex was absent. This combination localizes to a lesion in the cervical spinal cord. No recent serotonergic medications nor fever or rigidity to suspect a serotonin syndrome. Potentially related to question of a possible C2 vertebral body fracture that was noted on CT cervical spine in May 2024. Can better appreciate on MRI.     Recommendations:  No indication to start anti-seizure therapy given low concern for seizure. Continue to monitor clinically.  If patient remains obtunded despite escalation in therapy for hepatic encephalopathy, consider MRI brain w/wo and a 2-hr cvEEG (once portable machines become available) to broaden diagnostic workup for AMS.   Obtain routine MRI cervical w/wo contrast to further evaluate patient's hyperreflexia       Dr. Clarice was available.     Pecola Minerva, MD, MPH  PGY3, Neurology         HPI        Reason for Consult: c/f seizure    [name] is a 78 y.o. female on whom I have been asked by Morene Lang Early, MD to consult for c/f seizure.    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     At around 1930 on 11/17 was noted to have roving horizontal eye movements. Movements were nonrhythmic but persisted for several hours raising primary teams concerns for possible seizure. They gave 4mg  versed  and per their report movements only slowed down for a brief duration.     No reported facial twitching, limb shaking, fixed lateralized gaze, urinary incontinence, or tongue injury. Per chart review, no past history of seizures.     Per nursing report, patient has gradually become less and less responsive over the last 3 days. She was following commands and moving extremities (squeeze  hands, wiggle toes) on 11/14, but now is unresponsive and has no spontaneous movement.     Lactulose  was held for intermittent periods starting 11/12 and was discontinued on 11/15 due to increased bowel movement frequency (per chart review had 5 BMs on 11/15). Since worsened mentation on 11/16,  lactulose  was resumed.   Ammonia 291 on 11/16, previously 160 on 10/24    Earlier in ICU stay had E. Coli bacteremia -Finished course of ceftriaxone.     On a CTA abdomen in October 2025 was noted to have a chronic T12 compression fracture.   On a CT cervical spine in May 2024 was noted to have questionable lucency involving the left lateral aspect of C2 vertebral body.     Has known history of osteoporosis identified on Dexa scan of femoral neck (11/2022 T score -2.9).     Allergies[1]     Current Medications[2] Prescriptions Prior to Admission[3]    Past Medical History[4]    Past Surgical History[5]    Social History[6]    Family History[7]    Code Status: Full Code     Review of Systems     Unable to obtain due to patient's mental status.       Objective        Temp:  [36.7 ??C (98 ??F)-37.9 ??C (100.2 ??F)] 36.7 ??C (98.1 ??F)  Pulse:  [81-107] 91  SpO2 Pulse:  [73-109] 90  Resp:  [12-40] 28  BP: (95-130)/(33-76) 110/52  MAP (mmHg):  [55-80] 69  FiO2 (%):  [30 %-40 %] 30 %  SpO2:  [89 %-100 %] 98 %  I/O this shift:  In: 758.4 [P.O.:60; I.V.:118.4; NG/GT:580]  Out: 0     Physical Exam:  General Exam:  General: Lying in bed. No obvious distress.   ENT:  Mucous membranes moist. Oropharynx clear.  Cardiovascular:  Regular rate and rhythm.   Respiratory: trach in place.  Gastrointestinal: soft  Extremities: Edema in upper extremities bilateral.   Edema in lower extremities bilateral.    Skin: No obvious rashes or ecchymoses.    Neurological Exam:  Mental Status  LOC: does not arouse  Orientation: UTA  Speech: UTA    Cranial Nerves  Pupils: PERRL 5mm -> 4mm  Corneals: absent bilaterally  Gaze: slow roving, nonrhythmic, horizontal eye movements   Face Motor: normal and symmetric  Oculocephalic: present  Cough: strong  Gag: strong    Motor:   RUE: no response  LUE: flicker  RLE: brisk toe and foot extension to light touch over the dorsum of the foot and triple flexion to noxious  LLE: toe and foot extension to light touch over the dorsum of the foot and triple flexion to noxious     Sensory:  response to light touch and noxious as above    Reflexes:   Reflexes Right Left   Biceps  C5 3+ 3+   Brachioradialis C6  3+ 3+   Triceps C7 3+ 3+   Patella L3 (4) mute mute   Achilles S1 (S2) mute mute   Plantar Response Extensor Extensor   Jaw Jerk CN V Absent   Pectoralis C5-T1 Present Present   Hoffman Present Absent   Cross Adductors L2-4 Absent Absent     Glasgow Coma Score  Motor: flexion = 3  Verbal: no verbal response = 1  Eyes: eyes do not open = 1       Diagnostic Studies      All Labs Last 24hrs:  Recent Results (from the past 24 hours)   Basic Metabolic Panel    Collection Time: 04/17/24  4:32 AM   Result Value Ref Range    Sodium 145 135 - 145 mmol/L    Potassium 3.8 3.4 - 4.8 mmol/L    Chloride 107 98 - 107 mmol/L    CO2 28.0 20.0 - 31.0 mmol/L    Anion Gap 10 5 - 14 mmol/L    BUN 20 9 - 23 mg/dL    Creatinine 9.54 (L) 0.55 - 1.02 mg/dL    BUN/Creatinine Ratio 44     eGFR CKD-EPI (2021) Female >90 >=60 mL/min/1.49m2    Glucose 173 70 - 179 mg/dL    Calcium 8.5 (L) 8.7 - 10.4 mg/dL   Hepatic Function Panel    Collection Time: 04/17/24  4:32 AM   Result Value Ref Range    Albumin 2.9 (L) 3.4 - 5.0 g/dL    Total Protein 5.3 (L) 5.7 - 8.2 g/dL    Total Bilirubin 0.9 0.3 - 1.2 mg/dL    Bilirubin, Direct 9.49 (H) 0.00 - 0.30 mg/dL    AST 27 <=65 U/L    ALT 12 10 - 49 U/L    Alkaline Phosphatase 157 (H) 46 - 116 U/L   Magnesium  Level    Collection Time: 04/17/24  4:32 AM   Result Value Ref Range    Magnesium  2.0 1.6 - 2.6 mg/dL   Phosphorus Level    Collection Time: 04/17/24  4:32 AM   Result Value Ref Range    Phosphorus 2.7 2.4 - 5.1 mg/dL   Cystatin C    Collection Time: 04/17/24  4:32 AM   Result Value Ref Range    Cystatin C 1.71 (H) 0.64 - 1.23 mg/L    eGFR CKD-EPI (2012) Cystatin C Female 33 (L) >=60 mL/min/1.30m2   APTT    Collection Time: 04/17/24  4:32 AM   Result Value Ref Range    APTT 26.8 24.8 - 38.4 sec    Heparin Correlation 0.2    Blood Gas, Venous    Collection Time: 04/17/24  4:32 AM   Result Value Ref Range    Specimen Source Venous     FIO2 Venous Not Specified     pH, Venous 7.46 (H) 7.32 - 7.43    pCO2, Ven 41 40 - 60 mm Hg    pO2, Ven 43 30 - 55 mm Hg    HCO3, Ven 29 (H) 22 - 27 mmol/L    Base Excess, Ven 4.7 (H) -2.0 - 2.0    O2 Saturation, Venous 77.8 40.0 - 85.0 %    Carboxyhemoglobin, Venous 1.6 (H) <1.2 %    Methemoglobin, Venous <1.0 <1.5 %    Oxyhemoglobin Venous 76.3 40.0 - 85.0 %   CBC w/ Differential    Collection Time: 04/17/24  4:32 AM   Result Value Ref Range    WBC 5.2 3.6 - 11.2 10*9/L    RBC 2.67 (L) 3.95 - 5.13 10*12/L    HGB 8.6 (L) 11.3 - 14.9 g/dL    HCT 73.3 (L) 65.9 - 44.0 %    MCV 99.5 (H) 77.6 - 95.7 fL    MCH 32.1 25.9 - 32.4 pg    MCHC 32.3 32.0 - 36.0 g/dL    RDW 79.4 (H) 87.7 - 15.2 %    MPV 9.0 6.8 - 10.7 fL    Platelet 141 (L) 150 - 450 10*9/L    Neutrophils % 58.0 %  Lymphocytes % 24.3 %    Monocytes % 13.5 %    Eosinophils % 3.6 %    Basophils % 0.6 %    Absolute Neutrophils 3.0 1.8 - 7.8 10*9/L    Absolute Lymphocytes 1.3 1.1 - 3.6 10*9/L    Absolute Monocytes 0.7 0.3 - 0.8 10*9/L    Absolute Eosinophils 0.2 0.0 - 0.5 10*9/L    Absolute Basophils 0.0 0.0 - 0.1 10*9/L    Anisocytosis Moderate (A) Not Present   Morphology Review    Collection Time: 04/17/24  4:32 AM   Result Value Ref Range    Smear Review Comments See Comment Undefined   Urinalysis with Microscopy    Collection Time: 04/17/24 11:28 AM   Result Value Ref Range    Color, UA Yellow     Clarity, UA Clear     Specific Gravity, UA 1.013 1.003 - 1.030    pH, UA 6.0 5.0 - 9.0    Leukocyte Esterase, UA Negative Negative    Nitrite, UA Negative Negative    Protein, UA Negative Negative    Glucose, UA Negative Negative    Ketones, UA Negative Negative    Urobilinogen, UA <2.0 mg/dL <7.9 mg/dL    Bilirubin, UA Negative Negative    Blood, UA Negative Negative    RBC, UA 1 <=4 /HPF    WBC, UA 1 0 - 5 /HPF    Squam Epithel, UA <1 0 - 5 /HPF    Bacteria, UA None Seen None Seen /HPF    Amorphous Crystal, UA Rare /HPF Ammonia    Collection Time: 04/17/24 11:34 AM   Result Value Ref Range    Ammonia 291 (H) 11 - 32 umol/L   Blood Gas, Venous    Collection Time: 04/17/24 12:57 PM   Result Value Ref Range    Specimen Source Venous     FIO2 Venous Not Specified     pH, Venous 7.50 (H) 7.32 - 7.43    pCO2, Ven 36 (L) 40 - 60 mm Hg    pO2, Ven 44 30 - 55 mm Hg    HCO3, Ven 27 22 - 27 mmol/L    Base Excess, Ven 4.1 (H) -2.0 - 2.0    O2 Saturation, Venous 77.2 40.0 - 85.0 %    Carboxyhemoglobin, Venous 2.1 (H) <1.2 %    Methemoglobin, Venous <1.0 <1.5 %    Oxyhemoglobin Venous 75.1 40.0 - 85.0 %   aPTT    Collection Time: 04/17/24  3:09 PM   Result Value Ref Range    APTT >400.0 (HH) 24.8 - 38.4 sec    Heparin Correlation >2.3    Blood Gas, Venous    Collection Time: 04/17/24  3:09 PM   Result Value Ref Range    Specimen Source Venous     FIO2 Venous Not Specified     pH, Venous 7.52 (H) 7.32 - 7.43    pCO2, Ven 34 (L) 40 - 60 mm Hg    pO2, Ven 39 30 - 55 mm Hg    HCO3, Ven 27 22 - 27 mmol/L    Base Excess, Ven 4.2 (H) -2.0 - 2.0    O2 Saturation, Venous 71.4 40.0 - 85.0 %    Carboxyhemoglobin, Venous 1.8 (H) <1.2 %    Methemoglobin, Venous <1.0 <1.5 %    Oxyhemoglobin Venous 69.6 40.0 - 85.0 %   aPTT    Collection Time: 04/17/24  4:05 PM   Result Value Ref Range  APTT 71.5 (H) 24.8 - 38.4 sec    Heparin Correlation 0.4      CT head 11/17   No acute abnormalities on personal review    MRI Brain W Wo Contrast, 04/03/24  Impression  Multiple sequences are degraded due to motion which limits evaluation.  Within the limitations no acute intracranial abnormality.  Severe periventricular and deep white matter T2/FLAIR hyperintense signal compatible with small vessel ischemic changes. Multiple microhemorrhages as described. Consider cognitive assessment.  Remote left cerebellar infarct.  Small right mastoid effusion.                  [1]   Allergies  Allergen Reactions    Rocuronium Anaphylaxis     Suspected intraoperative anaphylaxis 04/15/2024. See anesthetic from tracheostomy. Tryptase pending at time of listing.     Sulfa (Sulfonamide Antibiotics) Anaphylaxis    Sulfur  Anaphylaxis     Tolerated sulfur  colloid injection without incident 09/03/2020.    Aspirin      thrombocytopenia   [2]   Current Facility-Administered Medications   Medication Dose Route Frequency Provider Last Rate Last Admin    acetaminophen  (TYLENOL ) tablet 650 mg  650 mg Oral Q4H PRN Sines, Benjamin Jacob, MD   650 mg at 04/12/24 1214    cyanocobalamin (vitamin B-12) tablet 1,000 mcg  1,000 mcg Enteral tube: gastric Daily Sines, Benjamin Jacob, MD   1,000 mcg at 04/17/24 9192    docusate sodium (COLACE) capsule 100 mg  100 mg Oral BID PRN Sines, Benjamin Jacob, MD        esomeprazole (NEXIUM) granules 40 mg  40 mg Enteral tube: gastric Daily Sines, Benjamin Jacob, MD   40 mg at 04/17/24 0807    famotidine (PEPCID) tablet 20 mg  20 mg Enteral tube: gastric BID Sweeney, [name] A, MD   20 mg at 04/17/24 2041    flu vacc 2025-26 (65 yr up) (PF)(FLUAD)45 mcg(34mcgx3)/0.5 ml IM syringe  0.5 mL Intramuscular During hospitalization Sines, Benjamin Jacob, MD        folic acid  (FOLVITE ) tablet 1 mg  1 mg Enteral tube: gastric Daily Sines, Benjamin Jacob, MD   1 mg at 04/17/24 0807    heparin (porcine) 1000 unit/mL injection 2,000 Units  2,000 Units Intravenous Q6H PRN Sines, Benjamin Jacob, MD        heparin 25,000 Units/250 mL (100 units/mL) in 0.45% saline infusion (premade)  0-24 Units/kg/hr Intravenous Continuous Malka Waddell HERO, DO 12.44 mL/hr at 04/17/24 9176 12 Units/kg/hr at 04/17/24 9176    lactulose  oral solution  30 g Enteral tube: gastric TID [place], Chinonyerem A, MD   30 g at 04/17/24 2041    melatonin tablet 3 mg  3 mg Oral QPM Sines, Benjamin Jacob, MD   3 mg at 04/17/24 1808    [Provider Hold] metoPROLOL tartrate (Lopressor) tablet 25 mg  25 mg Enteral tube: gastric BID Sines, Benjamin Jacob, MD   25 mg at 04/16/24 2110    multivitamins, therapeutic with minerals tablet 1 tablet  1 tablet Enteral tube: gastric Daily Sines, Benjamin Jacob, MD   1 tablet at 04/17/24 9192    NORepinephrine 8 mg in dextrose  5 % 250 mL (32 mcg/mL) infusion PMB  0-30 mcg/min Intravenous Continuous Dorise Hove, MD   Stopped at 04/18/24 0015    rifAXIMin  (XIFAXAN ) oral suspension  550 mg Enteral tube: gastric BID Sines, Benjamin Jacob, MD   550 mg at 04/17/24 2041    sodium chloride  (NS) 0.9 % flush 10  mL  10 mL Intravenous Q8H Sines, Morene Cadet, MD   10 mL at 04/17/24 1809    sodium chloride  (NS) 0.9 % flush 10 mL  10 mL Intravenous Q8H Sines, Morene Cadet, MD   10 mL at 04/17/24 1809    thiamine  mononitrate (vit B1) tablet 100 mg  100 mg Enteral tube: gastric Daily Sines, Benjamin Jacob, MD   100 mg at 04/17/24 0807   [3]   Medications Prior to Admission   Medication Sig Dispense Refill Last Dose/Taking    anastrozole  (ARIMIDEX ) 1 mg tablet Take 1 tablet (1 mg total) by mouth daily. 90 tablet 3 03/10/2024 at  6:00 AM    carvedilol  (COREG ) 3.125 MG tablet Take 1 tablet (3.125 mg total) by mouth two (2) times a day. 180 tablet 2 03/10/2024 Morning    folic acid  (FOLVITE ) 1 MG tablet Take 1 tablet (1 mg total) by mouth daily.   03/10/2024 at  6:00 AM    furosemide (LASIX) 40 MG tablet Take 1 tablet (40 mg total) by mouth daily as needed. 30 tablet 11 03/10/2024 at  6:00 AM    magnesium  oxide (MAG-OX) 400 mg (241.3 mg elemental magnesium ) tablet Take 1 tablet (400 mg total) by mouth two (2) times a day. 180 tablet 3 03/10/2024 at  6:00 AM    potassium chloride  20 MEQ ER tablet Take 1 tablet (20 mEq total) by mouth two (2) times a day. 60 tablet 11 03/09/2024 at  6:00 AM    spironolactone (ALDACTONE) 25 MG tablet Take 1 tablet (25 mg total) by mouth daily. 90 tablet 1 03/10/2024 at  6:00 AM    thiamine  (B-1) 100 MG tablet Take 1 tablet (100 mg total) by mouth daily.   03/10/2024 Morning    vitamin E-268 mg, 400 UNIT, 268 mg (400 UNIT) capsule Take 800 mg by mouth in the morning. 2 tabs.   03/10/2024 Morning    acetaminophen  (TYLENOL ) 325 MG tablet Take 2 tablets (650 mg total) by mouth every six (6) hours as needed for pain.  0     [Paused] alendronate  (FOSAMAX ) 70 MG tablet Take 1 tablet (70 mg total) by mouth every seven (7) days. (Patient not taking: Reported on 03/07/2024) 12 tablet 3     calcium carbonate (OS-CAL) 1,250 mg (500 mg elem calcium) tablet Take 1 tablet (500 mg elem calcium total) by mouth in the morning. (Patient not taking: Reported on 03/07/2024)       cyanocobalamin 1000 MCG tablet Take 1 tablet (1,000 mcg total) by mouth daily.       [EXPIRED] nystatin  (MYCOSTATIN ) 100,000 unit/gram powder Apply to affected area 3 times daily 15 g 0    [4]   Past Medical History:  Diagnosis Date    A-fib (CMS-HCC)     Alcoholism    (CMS-HCC)     Alcoholism /alcohol abuse     Cirrhosis    (CMS-HCC)     Depression     Hypertension     Liver disease     Malignant neoplasm of overlapping sites of right breast in female, estrogen receptor positive    (CMS-HCC) 10/03/2020   [5]   Past Surgical History:  Procedure Laterality Date    BREAST BIOPSY Right     benign-a long time ago    BREAST BIOPSY Right 07/2020    malignant    BREAST LUMPECTOMY Right     4 2022    CENTRAL LINE  03/25/2024    CHOLECYSTECTOMY  PR BX/REMV,LYMPH NODE,DEEP AXILL Right 09/03/2020    Procedure: BX/EXC LYMPH NODE; OPEN, DEEP AXILRY NODE;  Surgeon: Alm Elsie Como, MD;  [facility];  Service: Surgical Oncology Breast    PR CARDIOVERSION, ELECTIVE;EXTERN N/A 03/10/2024    Procedure: CARDIOVERSION, ELECTIVE, ELECTRICAL CONVERSION OF ARRHYTHMIA; EXTERNAL;  Surgeon: Sedalia Velma Hamilton, MD;  Location: [facility] OR [facility];  Service: Cardiology    PR ECHO HEART,TRANSESOPHAGEAL,COMPLETE Midline 03/10/2024    Procedure: ECHOCARDIOGRAPHY, TRANSESOPHAGEAL, REAL-TIME WITH IMAGE DOCUMENTATION;  Surgeon: Sedalia Velma Hamilton, MD;  Location: [facility] OR [facility];  Service: Cardiology    PR INTRAOPERATIVE SENTINEL LYMPH NODE ID W DYE INJECTION Right 09/03/2020    Procedure: INTRAOPERATIVE IDENTIFICATION SENTINEL LYMPH NODE(S) INCLUDE INJECTION NON-RADIOACTIVE DYE, WHEN PERFORMED;  Surgeon: Alm Elsie Como, MD;  Location: [facility] OR [facility];  Service: Surgical Oncology Breast    PR MASTECTOMY, PARTIAL Right 09/03/2020    Procedure: MASTECTOMY, PARTIAL (EG, LUMPECTOMY, TYLECTOMY, QUADRANTECTOMY, SEGMENTECTOMY);  Surgeon: Alm Elsie Como, MD;  Location: [facility] OR [facility];  Service: Surgical Oncology Breast    PR UPPER GI ENDOSCOPY,BIOPSY N/A 02/03/2018    Procedure: UGI ENDOSCOPY; WITH BIOPSY, SINGLE OR MULTIPLE;  Surgeon: Eleanor Dewey Sorrel, MD;  Location: [facility];  Service: Gastroenterology    RADIATION Right     unsure finish date    TUBAL LIGATION     [6]   Social History  Socioeconomic History    Marital status: Married     Spouse name: None    Number of children: None    Years of education: None    Highest education level: None   Tobacco Use    Smoking status: Never     Passive exposure: Past    Smokeless tobacco: Never   Vaping Use    Vaping status: Never Used   Substance and Sexual Activity    Alcohol use: Not Currently    Drug use: Never    Sexual activity: Not Currently   Social History Narrative    Daughter fills med boxes weekly.      Social Drivers of Health     Food Insecurity: No Food Insecurity (03/02/2024)    Hunger Vital Sign     Worried About Running Out of Food in the Last Year: Never true     Ran Out of Food in the Last Year: Never true   Tobacco Use: Low Risk (03/30/2024)    Patient History     Smoking Tobacco Use: Never     Smokeless Tobacco Use: Never     Passive Exposure: Past   Transportation Needs: No Transportation Needs (03/02/2024)    PRAPARE - Transportation     Lack of Transportation (Medical): No     Lack of Transportation (Non-Medical): No   Alcohol Use: Not At Risk (04/20/2023)    Alcohol Use     How often do you have a drink containing alcohol?: Never     How many drinks containing alcohol do you have on a typical day when you are drinking?: 1 - 2     How often do you have 5 or more drinks on one occasion?: Never   Housing: Low Risk (03/02/2024)    Housing     Within the past 12 months, have you ever stayed: outside, in a car, in a tent, in an overnight shelter, or temporarily in someone else's home (i.e. couch-surfing)?: No     Are you worried about losing your housing?: No   Physical Activity:  Inactive (04/20/2023)    Exercise Vital Sign     Days of Exercise per Week: 0 days     Minutes of Exercise per Session: 0 min   Utilities: Low Risk (04/20/2023)    Utilities     Within the past 12 months, have you been unable to get utilities (heat, electricity) when it was really needed?: No   Stress: No Stress Concern Present (04/20/2023)    [employer] of Occupational Health - Occupational Stress Questionnaire     Feeling of Stress : Only a little   Interpersonal Safety: Patient Unable To Answer (03/11/2024)    Interpersonal Safety     Unsafe Where You Currently Live: Patient unable to answer     Physically Hurt by Anyone: Patient unable to answer     Abused by Anyone: Patient unable to answer   Substance Use: Low Risk (04/20/2023)    Substance Use     In the past year, how often have you used prescription drugs for non-medical reasons?: Never     In the past year, how often have you used illegal drugs?: Never     In the past year, have you used any substance for non-medical reasons?: No   Social Connections: Moderately Isolated (04/20/2023)    Social Connection and Isolation Panel     Frequency of Communication with Friends and Family: More than three times a week     Frequency of Social Gatherings with Friends and Family: More than three times a week     Attends Religious Services: Never     Database Administrator or Organizations: No     Attends Engineer, Structural: Never     Marital Status: Married   Programmer, Applications: Low Risk (03/02/2024)    Overall Financial Resource Strain (CARDIA) Difficulty of Paying Living Expenses: Not hard at all   Health Literacy: Low Risk (04/20/2023)    Health Literacy     : Never   Internet Connectivity: No Internet connectivity concern identified (04/20/2023)    Internet Connectivity     Do you have access to internet services: Yes     How do you connect to the internet: Personal Device at home     Is your internet connection strong enough for you to watch video on your device without major problems?: Yes     Do you have enough data to get through the month?: Yes     Does at least one of the devices have a camera that you can use for video chat?: Yes   [7]   Family History  Problem Relation Age of Onset    Heart disease Mother     No Known Problems Father     No Known Problems Sister     No Known Problems Daughter     No Known Problems Maternal Grandmother     No Known Problems Maternal Grandfather     No Known Problems Paternal Grandmother     No Known Problems Paternal Grandfather     Lupus Brother     No Known Problems Other     BRCA 1/2 Neg Hx     Breast cancer Neg Hx     Cancer Neg Hx     Colon cancer Neg Hx     Endometrial cancer Neg Hx     Ovarian cancer Neg Hx     Mental illness Neg Hx     Substance Abuse Disorder Neg Hx

## 2024-04-18 NOTE — Progress Notes (Signed)
 ICU TRANSPORT NOTE    Destination: MRI    Departing Unit: MICU  Pickup Time: 2030    Return Unit: MICU  Return Time: 2142    [facility] patient ID band verified  Allergies Reviewed  Code Status at time of transport: Full    Report received from primary nurse via [employer]. Handoff performed of continuous drip/infusion and . Patient transported via stretcher under ICU level of care. See vital signs during transport via [employer]. O2 via Ventilator @ 30 %. Patient is somnolent. Patient tolerated procedure/scan well with PRNs. Universal precautions maintained throughout transport.    Update and care given to primary nurse. See Doc Flowsheets/MAR for additional transportation documentation. Proper body mechanics and safe patient handling equipment were utilized throughout transport.

## 2024-04-18 NOTE — Procedures (Signed)
 Patient: [name]  Date of Birth: 07-Aug-1945  Attending: Clem Corns, M.D.  Ordering Provider: Ione Dreama RAMAN, MD  [facility] No: [id number]     DATE STARTED: 04/18/2024 13:59  DATE ENDED: 04/18/2024 17:00     HISTORY:  78 y.o. female pmhx HFpEF, afib, and MASLD cirrhosis admitted with respiratory failure, hepatic encephalopathy, and septic shock course with course c/b hypernatremia. Consult for c/f seizure.     PROCEDURE:  Continuous EEG with simultaneous video recording was performed utilizing 21 active electrodes placed according to the international 10-20 system. The study was recorded digitally with a bandpass of 1-70Hz  and a sampling rate of 200Hz  and was reviewed with the possibility of multiple reformatting. The study was digitally processed with potential spike and seizure events identified for physician analysis and review. Patient recognized events were identified by a push button marker and reviewed by the physician. Simultaneous video was reviewed for all patient events.     TECHNICAL DESCRIPTION:  Background activities are continuous and comprised of theta and delta range frequencies. In addition, in the posterior head regions lateralized periodic discharges plus fast activity (LPD's plus F) at 1 Hz versus more continuous semi-rhythmical faster frequency activities is seen. This activity is concerning to be ictal in nature. Some limited improvement in the occurrence of these activities is seen after 16:40 on 04/18/2024. No clinical correlate is seen.     IMPRESSION:  This EEG is abnormal secondary to diffuse slowing as well as the occurrence of periodic and continuous activities in the posterior head regions which is concerning to be ictal in nature.     CLINICAL INTERPRETATION:  Diffuse slowing is indicative of bihemispheric dysfunction which could be on the basis of a toxic, metabolic, or primary neuronal disorder. The lateralized periodic discharges plus fast activity (LPDs + F) as well as the more continuous semi-rhythmical faster frequency activities seen in the posterior head regions are concerning to be ictal in nature. Some limited improvement in the occurrence of these activities is seen after 16:40 on 04/18/2024.     Note, this patient continued on EEG monitoring with a different reader. See their subsequent report as well.

## 2024-04-18 NOTE — Treatment Plan (Signed)
 Brief Inpatient Consult Note         Recommendations          [name] is a 78 y.o. female with respiratory failure, hepatic encephalopathy, and septic shock course with course c/b hypernatremia on whom we have been consulted for c/f seizure.     # Interictal continuum (IIC): Initial EEG record with LPDs+Fast activity as well as more continuous semi-rhythmical faster frequencies in the posterior head regions which meet IIC criteria and are concerning to be ictal in nature. Patient loaded with Keppra ~30 mg/kg and EEG record evolved to show more of a burst attenuation/suppression pattern, though still with some ictal patterns in the posterior region. As such we recommend escalating to a second ASM with Vimpat as part of the algorithm in IIC treatment trials (IIC treatment trial).     RECOMMENDATIONS:   Increase maintenance Keppra therapy to 1g BID   Give IV Vimpat 400 mg once, followed by maintenance therapy with 100 mg BID  - Obtain baseline ECG to monitor PR interval   3.  Remainder of recommendations per consult note from today 11/17    This patient was discussed with Dr. Sidonie and Dr. Calvert.     Pecola Minerva, MD, MPH  PGY3, Neurology

## 2024-04-18 NOTE — Progress Notes (Signed)
 MICU Daily Progress Note     Date of Service: 04/18/2024    Problem List:   Principal Problem:    Bacteremia, escherichia coli  Active Problems:    Alcoholic liver disease (HHS-HCC)    HTN (hypertension)    Depression with anxiety    Class 2 severe obesity with serious comorbidity in adult    Atrial fibrillation    (CMS-HCC)    Pleural effusion    Hypernatremia    Thrombocytopenia    Cirrhosis    (CMS-HCC)    A-fib (CMS-HCC)    Hepatic encephalopathy    (CMS-HCC)    Anaphylaxis      Interval history: [name] is a 78 y.o. female with HFpEF, HTN, Afib on AC, MASLD cirrhosis, originally hospitalized for scheduled TEE/DCCV (aborted d/t LAA clot) c/b AHHRF, encephalopathy and septic shock requiring MICU care, was s/p extubation transferred to [facility] for further management of anticoagulation and hypernatremia. Transferred back to [facility] MICU for closer monitoring for respiratory status, pressor requirement, GI bleed, and management of carotid artery injury.     On 10/24, patient had acute worsening of respiratory status leading requiring intubation. When patient was given sedation for intubation, she coded and promptly had ROSC after a few minutes of CPR. Subsequently, CVC placed in neck, used to draw blood, unclear if given pressors through it, then found  to be in arterial system based on blood gas and imaging, likely right carotid. Line was subsequently removed, and pressure was held for 15 minutes, hemostasis with no hematoma. She was transferred to [facility] MICU for management of this carotid injury, to be evaluated by vascular surgery while also managing her increased vasopressor requirement and respiratory distress.  Has been weaned off pressors since 10/27 at 1 PM. VBG's indicating that she is ready for extubation, however her mentation suggests that she is not able to protect her airway.  She was extubated on 10/31, and mentation slowly improved.  Unfortunately on the morning of 11/2 she had increased lethargy, increased secretions, and she was not protecting her airway.  She was reintubated on 11/2, and required pressor support with norepinephrine and vasopressin after the induction agents.  Self extubated on 11/5. Re intubated on 11/6 due to hypercarbia and increased work of breathing.  We are left with the challenging options regarding the care of [name].  Due to her multiple failed extubations, we are concerned [name] will need a tracheostomy long-term to protect her airway.  There is a meeting with palliative care and family on 04/13/2024, her family agreed for tracheostomy.  Tracheostomy placed 11/14.     24hr events:   -overnight eye deviation and AMS decreased responsiveness on exam --> neuro consult, head CT normal  -neuro following: EEG with ictal activity. Keppra 3.5g IV + 750mg  BID  - MRI brain w/wo ordered   -CTM bowel movements (goal stool output daily)   -increase lactulose  to 30g q2h   -plan to exchange for trach on day five  -increase free water  flushes   -pressure support trial       Neurological   Hepatic encephalopathy - seizure-like activity  Per chart review at [facility], rapid response called on 10/24 for increased somnolence. At the time, differential included hepatic encephalopathy, toxic metabolic encephalopathy in the setting of shock (possibly sepsis), versus intracranial pathology. She was subsequently intubated. CT head on 10/24 showed no acute intracranial abnormality. Weaned off sedation with slow improvements in neurological status.  While we have  continued lactulose  therapy and seeing good response with several bowel movements, her mentation remains poor.  This is concerning for severe ICU delirium.  Meeting was had with daughter, [name], on 11/5.  Described that should intubation be necessary we would likely be moving towards a tracheostomy as [name] has continuously not been protecting her airway.  As of now [name] wanted to pursue aggressive measures including repeat intubation and potential tracheostomy. Tracheostomy placed 11/14.  Overnight on 04/18/2024, patient noted to have eye deviation along with decreased responsiveness on physical exam.  Head CT ordered and reassuring.  Neurology consulted.  EEG with evidence of fixed activity, therefore Keppra started per neurology.  Titrating lactulose  for goal stool output.  - Neurology following, appreciate recommendations  - Increase lactulose  to 30 g every 2 hours  - Rifaximin  550 mg twice daily  - MRI brain with/without contrast ordered  - Avoid sedating medications    Analgesia: No pain issues  RASS at goal? N/A, not on sedation  [place] Agitation Assessment Scale (RASS) : -4 (04/18/2024  6:00 AM)      Pulmonary   Acute Hypoxic Hypercarbic Respiratory Failure   Rapid response called on 10/24 for increased somnolence, found to have acute hypercarbic respiratory failure and subsequently intubated. Suspect hypercarbic respiratory failure due to encephalopathy as above. CXR on 10/24 showed worsening pleural effusions. Not currently getting diuresed. Given patient's hypoxia, and AMS, there was concern for infectious etiology. MRSA nares negative. Infectious workup thus far is unremarkable. SBTs show readiness extubation based on VBGs, however there is concern that she continues to be unable to protect her airway due to her current mentation.  Reintubated on 11/2 followed by therapeutic bronchoscopy.  Self extubated on 11/5.  Reintubate noted on 11/6 due to hypercarbia and not protecting her airway. Trach placed 11/14, complicated by anaphylactic reaction to rocuronium which was treated with epi, benadryl and steroids. Allergy added to chart.  - Continue mechanical ventilation, weaning as tolerated  - ENT following, appreciate recommendations  - Plan for first trach exchange with ENT on POD 5    Vent Mode: PRVC  FiO2 (%): 30 %  S RR: 15  S VT: 320 mL  PEEP: 5 cm H20  PR SUP: 16 cm H20      Cardiovascular   Carotid Artery Injury (resolved)  At [facility], CVC placed intended for RIJ on 10/24 following intubation, PEA, ROSC. Was used with known blood return. Unclear if it was used for pressor support, but highly likely that it was. CXR for placement check showed concern for intra aterial location, and blood gas confirmed it was arterial. Thought to be in carotid artery. CVC removed with pressure held for 15 minutes, with no hematoma formation. On arrival, no physical exam and bedside ultrasound showed no large hematoma. No active bleeding.   - Vascular Surgery consulted, stated fistula has healed based on Ultrasound findings    PEA arrest s/p ROSC  Hypovolemic/Hemorrhagic shock (resolved)  Atrial fibrillation  known left atrial appendage clot  HFpEF  Patient with PEA arrest peri-intubation with ROSC. Shock thought to be hemorrhagic in the setting of GI bleed History of atrial fibrillation with known LAA clot and HFpEF. Vasopressors were weaned off 10/27. APTT was elevated the morning of 10/28 necessitating changing anticoagulation from therapeutic heparin to therapeutic Lovenox .   -Hold metoprolol 25 mg twice daily  - Heparin gtt    Renal   Abnormal electrolytes (hypernatremia)  At [facility], had persistently hypernatremia near 153. Had been treated  with D5 gtt. Sodium throughout admission has largely been 146-148. Sodium has overall normalized and continues to be within normal limits.  - Strict I/O's  - BMP daily      Infectious Disease/Autoimmune   E. Coli Bacteremia (resolved),   Initially admitted for afib, found to have E. coli bacteremia on October 10. This was treated with cefazolin . Rapid response called 10/24, patient noted to be hypothermic. WBCs jumped from 3.0 to 9.7 on 10/24. Although the most recent lab was following intubation and CPR. UA with pyuria, rare bacteria, hematuria. Peri-intubation, patient developed significant hypotension requiring initiation of NE and vasopressin. Suspect possible septic shock with unknown infectious source. Patient started on vancomycin and cefepime 10/24. CT Abdomen pelvis concerning for aspiration pneumonia. Following infectious workup was unremarkable. Antibiotics were stopped on 10/27.  Infectious workup was repeated on 11/3 due to reintubation and status post bronchoscopy.  Completed course of ceftriaxone.  -CTM    Cultures:  Blood Culture, Routine (no units)   Date Value   04/09/2024 No Growth at 5 days   04/09/2024 No Growth at 5 days     Urine Culture, Comprehensive (no units)   Date Value   04/09/2024 NO GROWTH   04/04/2024 NO GROWTH     Lower Respiratory Culture (no units)   Date Value   04/10/2024 2+ Methicillin-Susceptible Staphylococcus aureus (A)   04/10/2024 1+ Oropharyngeal Flora Isolated     WBC (10*9/L)   Date Value   04/18/2024 5.0     WBC, UA (/HPF)   Date Value   04/17/2024 1       FEN/GI   Sigmoid Mass c/f Malignancy and Hematochezia (resolved)  Hemorrhagic Shock (resolved)  CT abdomen pelvis done 10/24 with evidence of cirrhosis and masslike thickening of the lower sigmoid colon concerning for malignancy.  CTA abdomen pelvis performed 1024 with active extravasation into the gastric fundus possibly secondary to traumatic enteric tube placement.  GI scoped the patient on 10/25 and clipped an area of active bleeding.  They removed a total of 1.5 L of blood.  Hemoglobin 10/20 6 AM downtrended to 6.0, so 2 units of blood transfused.  GI stated this was consistent with 10/25 scope and likely did not represent increased bleeding. Hb steadily improving with Hb of 9.2 on 10/28.   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 Given shock of unclear etiology. CTAP demonstrating cirrhosis. Likely that cirrhosis is contributing to hypotension.   - Continue with lactulose  30 g every 2 hours  - Goal stool output 1500 mL daily  - NGT in place; TF and FWF  - Daily MELD labs and CMP     MELD 3.0: 15 at 04/05/2024  4:31 AM  MELD-Na: 13 at 04/05/2024  4:31 AM  Calculated from:  Serum Creatinine: 0.43 mg/dL (Using min of 1 mg/dL) at 88/09/7972  5:68 AM  Serum Sodium: 143 mmol/L (Using max of 137 mmol/L) at 04/05/2024  4:31 AM  Total Bilirubin: 1.3 mg/dL at 88/09/7972  5:68 AM  Serum Albumin: 2.4 g/dL at 88/09/7972  5:68 AM  INR(ratio): 1.61 at 04/03/2024 10:26 AM  Age at listing (hypothetical): 78 years  Sex: Female at 04/05/2024  4:31 AM    Provider Malnutrition Assessment:  Body mass index is 38.76 kg/m??. BMI Interpretation: >/= 30 and <  40, consistent with obesity, clinically significant requiring additional resources and complicating multiple aspects of patient care.  GLIM criteria:   Pt does not meet criteria  -I have screened this patient for malnutrition and they did NOT meet criteria for malnutrition based on GLIM criteria.  -TF and FWF; Dietician consulted, appreciate assistance      Heme/Coag   Acute blood loss anemia  Hemoglobin down trended to 6.0 on the morning of 10/26 thought to be secondary to acute GI bleed that was scoped and clipped 10/25.  H/H stable.  -CBC daily  - Maintain active T&S    Endocrine   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)   - home anastrozole     Integumentary   NAI   #  - WOCN consulted for high risk skin assessment No. Reason: Not indicated.    Prophylaxis/LDA/Restraints/Consults   ICU Checklist completed: yes (see ICU rounding navigator in Epic)    Patient Lines/Drains/Airways Status       Active Active Lines, Drains, & Airways       Name Placement date Placement time Site Days    Tracheostomy Shiley 6 Cuffed 04/15/24  1133  6  2    NG/OG Tube Feedings 10 Fr. Right nostril 04/01/24  1138  Right nostril  16    External Urinary Device 04/13/24 With Suction 04/13/24  1700  -- 4    PICC Double Lumen 04/06/24 Left Basilic 04/06/24  1558 Basilic  11                  Patient Lines/Drains/Airways Status       Active Wounds       Name Placement date Placement time Site Days    Wound 03/01/24 Other (Comment) Toe (Comment which one) Left;Other (Comment) Blister present on arrival, tx already in outpatient setting, in process of healing, surrounding erythema 03/01/24  0129  Toe (Comment which one)  48    Wound 03/13/24 Irritant Contact Dermatitis Incontinence Sacrum Mid gluteal cleft MASD? 03/13/24  --  Sacrum  36    Wound 04/15/24 Surgical Neck tracheostomy secured with sutures and trach collar ( surgicel applied 04/15/24  1144  Neck  2                  Goals of Care     Code Status:   Orders Placed This Encounter   Procedures    Full Code     Standing Status:   Standing     Number of Occurrences:   1        Designated Healthcare Decision Maker:  Ms. Bellanger designated healthcare decision maker(s) is/are   HCDM (patient stated preference): [name],[name] - Spouse - [phone]    HCDM (patient stated preference): [name],[name] - Daughter - [phone]. See HCDM section of Epic sidebar/storyboard or ACP tab in patient chart for details regarding active HCDMs and patient capacity for decision-making.      Subjective     See 24-hour events above    Objective     Vitals - past 24 hours  Temp:  [36.7 ??C (98.1 ??F)-37.9 ??C (100.2 ??F)] 36.7 ??C (98.1 ??F)  Pulse:  [82-107] 86  SpO2 Pulse:  [79-109] 87  Resp:  [12-40] 19  BP: (95-138)/(32-76) 131/51  FiO2 (%):  [30 %-40 %] 30 %  SpO2:  [89 %-99 %] 98 % Intake/Output  I/O last 3 completed shifts:  In: 4019.4 [P.O.:180; I.V.:649.4; NG/GT:3090; IV Piggyback:100]  Out: 925 [Urine:925]     Physical Exam:    General: intubated on my examination. Trach in place. not following commands  HEENT: normocephalic, atraumatic   CV: pulses palpable, regular borderline tachycardia   Pulm: Rhonchi w transmitted upper airway sounds throughout  GI: soft, NTND, + BS  MSK: Bilateral LE pitting edema  Skin: numerous large flat violaceous ecchymosis on left arm, chest, neck bilaterally.   Neuro: Withdrawals to painful stimuli.  Not following commands.  Reflexes intact    Continuous Infusions:   Infusions Meds[1]    Scheduled Medications:   Scheduled Medications[2]    PRN medications:  PRN Medications[3]    Data/Imaging Review: Reviewed in Epic and personally interpreted on 04/18/2024. See EMR for detailed results.         [1]    heparin 12 Units/kg/hr (04/18/24 0222)    NORepinephrine bitartrate-NS 2 mcg/min (04/18/24 0222)   [2]    cyanocobalamin (vitamin B-12)  1,000 mcg Enteral tube: gastric Daily    esomeprazole  40 mg Enteral tube: gastric Daily    famotidine  20 mg Enteral tube: gastric BID    flu vac 2025 [id number](PF)  0.5 mL Intramuscular During hospitalization    folic acid   1 mg Enteral tube: gastric Daily    lactulose   30 g Enteral tube: gastric TID    melatonin  3 mg Oral QPM    [Provider Hold] metoPROLOL tartrate  25 mg Enteral tube: gastric BID    multivitamins (ADULT)  1 tablet Enteral tube: gastric Daily    potassium chloride  in water   10 mEq Intravenous Once    Followed by    potassium chloride  in water   10 mEq Intravenous Once    Followed by    potassium chloride  in water   10 mEq Intravenous Once    Followed by    potassium chloride  in water   10 mEq Intravenous Once    rifAXIMin   550 mg Enteral tube: gastric BID    sodium chloride   10 mL Intravenous Q8H    sodium chloride   10 mL Intravenous Q8H    thiamine  mononitrate (vit B1)  100 mg Enteral tube: gastric Daily   [3] acetaminophen , docusate sodium, heparin (porcine)

## 2024-04-18 NOTE — Consults (Signed)
 Initial Consult Note        Requesting Attending Physician:  Morene ONEIDA Dec, MD  Service Requesting Consult: Medical ICU (MDI)     Assessment and Plan     [name] is a 78 y.o. female pmhx HFpEF, afib, and MASLD cirrhosis admitted with respiratory failure, hepatic encephalopathy, and septic shock course with course c/b hypernatremia on whom I have been asked by Morene ONEIDA Dec, MD to consult for c/f seizure.    # Metabolic encephalopathy - Roving eye movements: Consulted for concern for seizure described as roving horizontal nonrhythmic eye movements lasting several hours in duration. Movements have begun in the context of progressively declining mental status over the last 3-4 days (intermittently following commands previously). CT head obtained was unremarkable. On neurologic exam patient is unresponsive and does not move her extremities spontaneously or to noxious stimuli (triple flexes in BLEs), but has intact brainstem reflexes. Of note, patient has NASH cirrhosis and hospitalization has been complicated by PEA arrest 10/24, s/p tracheostomy placement after 2 failed extubations. Roving eye movements are a sign of diffuse cortical dysfunction with preserved brainstem function, most often seen in encephalopathic or comatose patients. With consideration to patient's gradually worsening mental status while lactulose  had been recently held, highest suspicion is for a metabolic hepatic encephalopathy. Differential also includes delayed anoxic brain injury encephalopathy. Ammonia obtained on 11/16 was 291. Recommend continued management of hepatic encephalopathy per MICU team. However, will broaden workup with MRI brain w/wo and cvEEG.     # Bilateral upper extremity hyperreflexia: On exam noted to have hyperreflexia in bilateral upper extremities with positive hoffman's and pectoralis reflexes, while jaw jerk reflex was absent. This combination localizes to a lesion in the cervical spinal cord. No recent serotonergic medications nor fever or rigidity to suspect a serotonin syndrome. Potentially related to question of a possible C2 vertebral body fracture that was noted on CT cervical spine in May 2024. Can better appreciate on MRI.     Recommendations:  Please obtain CvEEG  Please obtain MRI brain and C-spine wwo contrast  Management of underlying medical issues including NASH cirrhosis and elevated ammonia per primary team. Recommend keeping Na+>145    Addendum: EEG with LPD and F waves as well as more continuous fast activities in the posterior head regions that may be ictal in nature. Will start a trial of ASMs. Loaded with keppra and will start keppra 750 mg BID.     This patient was seen and discussed with Dr. Clarice .     Louretta Shearing, MD  PGY-4, Neurology  [facility] Department of Neurology        HPI        Reason for Consult: c/f seizure    [name] is a 78 y.o. female on whom I have been asked by Morene ONEIDA Dec, MD to consult for c/f seizure.    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     At around 1930 on 11/17 was noted to have roving horizontal eye movements. Movements were nonrhythmic but persisted for several hours raising primary teams concerns for possible seizure. They gave 4mg  versed  and per their report movements only slowed down for a brief duration.     No reported facial twitching, limb shaking, fixed lateralized gaze, urinary incontinence, or tongue injury. Per chart review, no past history of seizures.     Per nursing report,  patient has gradually become less and less responsive over the last 3 days. She was following commands and moving extremities (squeeze hands, wiggle toes) on 11/14, but now is unresponsive and has no spontaneous movement.     Lactulose  was held for intermittent periods starting 11/12 and was discontinued on 11/15 due to increased bowel movement frequency (per chart review had 5 BMs on 11/15). Since worsened mentation on 11/16,  lactulose  was resumed.   Ammonia 291 on 11/16, previously 160 on 10/24    Earlier in ICU stay had E. Coli bacteremia -Finished course of ceftriaxone.     On a CTA abdomen in October 2025 was noted to have a chronic T12 compression fracture.   On a CT cervical spine in May 2024 was noted to have questionable lucency involving the left lateral aspect of C2 vertebral body.     Has known history of osteoporosis identified on Dexa scan of femoral neck (11/2022 T score -2.9).     Allergies[1]     Current Medications[2] Prescriptions Prior to Admission[3]    Past Medical History[4]    Past Surgical History[5]    Social History[6]    Family History[7]    Code Status: Full Code     Review of Systems     Unable to obtain due to patient's mental status.       Objective        Temp:  [36.7 ??C (98.1 ??F)-37.7 ??C (99.9 ??F)] 37.7 ??C (99.8 ??F)  Pulse:  [83-108] 104  SpO2 Pulse:  [73-113] 101  Resp:  [17-34] 23  BP: (95-141)/(32-86) 118/72  MAP (mmHg):  [53-90] 83  FiO2 (%):  [30 %-40 %] 30 %  SpO2:  [93 %-99 %] 97 %  I/O this shift:  In: 950 [P.O.:60; NG/GT:690; IV Piggyback:200]  Out: 100 [Stool:100]    Physical Exam:  General Exam:  General: Lying in bed. No obvious distress.   ENT:  Mucous membranes moist. Oropharynx clear.  Cardiovascular:  Regular rate and rhythm.   Respiratory: trach in place.  Gastrointestinal: soft  Extremities: Edema in upper extremities bilateral.   Edema in lower extremities bilateral.    Skin: No obvious rashes or ecchymoses.    Neurological Exam:  Mental Status  LOC: does not arouse  Orientation: UTA  Speech: UTA    Cranial Nerves  Pupils: PERRL 5mm -> 4mm  Corneals: Present bilaterally. Right more briskly responsive  Gaze: slow roving, nonrhythmic, horizontal eye movements   Face Motor: normal and symmetric  Oculocephalic: present  Cough: strong  Gag: strong    Motor:   RUE: no response  LUE: flicker  RLE: brisk toe and foot extension to light touch over the dorsum of the foot and triple flexion to noxious  LLE: toe and foot extension to light touch over the dorsum of the foot and triple flexion to noxious     Sensory:  response to light touch and noxious as above    Reflexes:   Reflexes Right Left   Biceps  C5 3+ 3+   Brachioradialis C6  3+ 3+   Triceps C7 3+ 3+   Patella L3 (4) mute mute   Achilles S1 (S2) mute mute   Plantar Response Extensor Extensor   Jaw Jerk CN V Absent   Pectoralis C5-T1 Present Present   Hoffman Present Absent   Cross Adductors L2-4 Absent Absent     Glasgow Coma Score  Motor: flexion = 3  Verbal: no verbal response = 1  Eyes: eyes do not open = 1       Diagnostic Studies      All Labs Last 24hrs:   Recent Results (from the past 24 hours)   Basic Metabolic Panel    Collection Time: 04/18/24  3:47 AM   Result Value Ref Range    Sodium 144 135 - 145 mmol/L    Potassium 3.6 3.4 - 4.8 mmol/L    Chloride 106 98 - 107 mmol/L    CO2 27.0 20.0 - 31.0 mmol/L    Anion Gap 11 5 - 14 mmol/L    BUN 22 9 - 23 mg/dL    Creatinine 9.51 (L) 0.55 - 1.02 mg/dL    BUN/Creatinine Ratio 46     eGFR CKD-EPI (2021) Female >90 >=60 mL/min/1.43m2    Glucose 172 70 - 179 mg/dL    Calcium 8.0 (L) 8.7 - 10.4 mg/dL   Hepatic Function Panel    Collection Time: 04/18/24  3:47 AM   Result Value Ref Range    Albumin 2.6 (L) 3.4 - 5.0 g/dL    Total Protein 5.1 (L) 5.7 - 8.2 g/dL    Total Bilirubin 1.1 0.3 - 1.2 mg/dL    Bilirubin, Direct 9.39 (H) 0.00 - 0.30 mg/dL    AST 29 <=65 U/L    ALT 13 10 - 49 U/L    Alkaline Phosphatase 150 (H) 46 - 116 U/L   Magnesium  Level    Collection Time: 04/18/24  3:47 AM   Result Value Ref Range    Magnesium  2.1 1.6 - 2.6 mg/dL   Phosphorus Level    Collection Time: 04/18/24  3:47 AM   Result Value Ref Range    Phosphorus 2.5 2.4 - 5.1 mg/dL   Cystatin C    Collection Time: 04/18/24  3:47 AM   Result Value Ref Range    Cystatin C 1.60 (H) 0.64 - 1.23 mg/L    eGFR CKD-EPI (2012) Cystatin C Female 36 (L) >=60 mL/min/1.50m2   Blood Gas, Venous    Collection Time: 04/18/24  3:47 AM   Result Value Ref Range    Specimen Source Venous     FIO2 Venous Not Specified     pH, Venous 7.47 (H) 7.32 - 7.43    pCO2, Ven 39 (L) 40 - 60 mm Hg    pO2, Ven 36 30 - 55 mm Hg    HCO3, Ven 29 (H) 22 - 27 mmol/L    Base Excess, Ven 4.5 (H) -2.0 - 2.0    O2 Saturation, Venous 64.4 40.0 - 85.0 %    Carboxyhemoglobin, Venous 1.7 (H) <1.2 %    Methemoglobin, Venous <1.0 <1.5 %    Oxyhemoglobin Venous 63.1 40.0 - 85.0 %   CBC w/ Differential    Collection Time: 04/18/24  3:47 AM   Result Value Ref Range    WBC 5.0 3.6 - 11.2 10*9/L    RBC 2.49 (L) 3.95 - 5.13 10*12/L    HGB 7.9 (L) 11.3 - 14.9 g/dL    HCT 75.5 (L) 65.9 - 44.0 %    MCV 98.1 (H) 77.6 - 95.7 fL    MCH 31.8 25.9 - 32.4 pg    MCHC 32.4 32.0 - 36.0 g/dL    RDW 80.3 (H) 87.7 - 15.2 %    MPV 9.6 6.8 - 10.7 fL    Platelet 131 (L) 150 - 450 10*9/L    Neutrophils % 59.8 %    Lymphocytes % 24.6 %  Monocytes % 13.3 %    Eosinophils % 1.8 %    Basophils % 0.5 %    Absolute Neutrophils 3.0 1.8 - 7.8 10*9/L    Absolute Lymphocytes 1.2 1.1 - 3.6 10*9/L    Absolute Monocytes 0.7 0.3 - 0.8 10*9/L    Absolute Eosinophils 0.1 0.0 - 0.5 10*9/L    Absolute Basophils 0.0 0.0 - 0.1 10*9/L    Anisocytosis Moderate (A) Not Present   Morphology Review    Collection Time: 04/18/24  3:47 AM   Result Value Ref Range    Smear Review Comments See Comment Undefined    Toxic Granulation Present (A) Not Present   APTT    Collection Time: 04/18/24  3:50 AM   Result Value Ref Range    APTT 330.0 (HH) 24.8 - 38.4 sec    Heparin Correlation 1.9    aPTT    Collection Time: 04/18/24  6:57 AM   Result Value Ref Range    APTT 106.2 (H) 24.8 - 38.4 sec    Heparin Correlation 0.6    aPTT    Collection Time: 04/18/24  2:15 PM   Result Value Ref Range    APTT 53.0 (H) 24.8 - 38.4 sec    Heparin Correlation 0.3      CT head 11/17   No acute abnormalities on personal review    MRI Brain W Wo Contrast, 04/03/24  Impression  Multiple sequences are degraded due to motion which limits evaluation.  Within the limitations no acute intracranial abnormality.  Severe periventricular and deep white matter T2/FLAIR hyperintense signal compatible with small vessel ischemic changes. Multiple microhemorrhages as described. Consider cognitive assessment.  Remote left cerebellar infarct.  Small right mastoid effusion.                    [1]   Allergies  Allergen Reactions    Rocuronium Anaphylaxis     Suspected intraoperative anaphylaxis 04/15/2024. See anesthetic from tracheostomy. Tryptase pending at time of listing.     Sulfa (Sulfonamide Antibiotics) Anaphylaxis    Sulfur  Anaphylaxis     Tolerated sulfur  colloid injection without incident 09/03/2020.    Aspirin      thrombocytopenia   [2]   Current Facility-Administered Medications   Medication Dose Route Frequency Provider Last Rate Last Admin    acetaminophen  (TYLENOL ) tablet 650 mg  650 mg Oral Q4H PRN Sines, Benjamin Jacob, MD   650 mg at 04/12/24 1214    cyanocobalamin (vitamin B-12) tablet 1,000 mcg  1,000 mcg Enteral tube: gastric Daily Sines, Benjamin Jacob, MD   1,000 mcg at 04/18/24 0825    docusate sodium (COLACE) capsule 100 mg  100 mg Oral BID PRN Sines, Benjamin Jacob, MD        esomeprazole (NEXIUM) granules 40 mg  40 mg Enteral tube: gastric Daily Sines, Benjamin Jacob, MD   40 mg at 04/18/24 0825    famotidine (PEPCID) tablet 20 mg  20 mg Enteral tube: gastric BID Sweeney, [name] A, MD   20 mg at 04/18/24 0825    flu vacc 2025-26 (65 yr up) (PF)(FLUAD)45 mcg(1mcgx3)/0.5 ml IM syringe  0.5 mL Intramuscular During hospitalization Sines, Benjamin Jacob, MD        folic acid  (FOLVITE ) tablet 1 mg  1 mg Enteral tube: gastric Daily Sines, Benjamin Jacob, MD   1 mg at 04/18/24 0825    heparin (porcine) 1000 unit/mL injection 2,000 Units  2,000 Units Intravenous Q6H PRN Sines, Morene Cadet, MD  heparin 25,000 Units/250 mL (100 units/mL) in 0.45% saline infusion (premade)  0-24 Units/kg/hr Intravenous Continuous SeilheimerWaddell HERO, DO 11.41 mL/hr at 04/18/24 0758 11 Units/kg/hr at 04/18/24 0758    lactulose  oral solution  30 g Enteral tube: gastric Q2H Sweeney, [name] A, MD   30 g at 04/18/24 1620    [START ON 04/19/2024] levETIRAcetam (KEPPRA) injection 750 mg  750 mg Intravenous Q12H [facility] Leverne Norleen LABOR, MD        melatonin tablet 3 mg  3 mg Enteral tube: gastric QPM [place], Chinonyerem A, MD        [Provider Hold] metoPROLOL tartrate (Lopressor) tablet 25 mg  25 mg Enteral tube: gastric BID Sines, Benjamin Jacob, MD   25 mg at 04/16/24 2110    multivitamins, therapeutic with minerals tablet 1 tablet  1 tablet Enteral tube: gastric Daily Sines, Benjamin Jacob, MD   1 tablet at 04/18/24 0825    NORepinephrine 8 mg in dextrose  5 % 250 mL (32 mcg/mL) infusion PMB  0-30 mcg/min Intravenous Continuous Dorise Hove, MD   Paused at 04/18/24 9070    rifAXIMin  (XIFAXAN ) oral suspension  550 mg Enteral tube: gastric BID Violette Morene Cadet, MD   550 mg at 04/18/24 0825    sodium chloride  (NS) 0.9 % flush 10 mL  10 mL Intravenous Q8H Violette Morene Cadet, MD   10 mL at 04/17/24 1809    sodium chloride  (NS) 0.9 % flush 10 mL  10 mL Intravenous Q8H Sines, Benjamin Jacob, MD   10 mL at 04/18/24 1101    thiamine  mononitrate (vit B1) tablet 100 mg  100 mg Enteral tube: gastric Daily Sines, Benjamin Jacob, MD   100 mg at 04/18/24 0825   [3]   Medications Prior to Admission   Medication Sig Dispense Refill Last Dose/Taking    anastrozole  (ARIMIDEX ) 1 mg tablet Take 1 tablet (1 mg total) by mouth daily. 90 tablet 3 03/10/2024 at  6:00 AM    carvedilol  (COREG ) 3.125 MG tablet Take 1 tablet (3.125 mg total) by mouth two (2) times a day. 180 tablet 2 03/10/2024 Morning    folic acid  (FOLVITE ) 1 MG tablet Take 1 tablet (1 mg total) by mouth daily.   03/10/2024 at  6:00 AM    furosemide (LASIX) 40 MG tablet Take 1 tablet (40 mg total) by mouth daily as needed. 30 tablet 11 03/10/2024 at  6:00 AM    magnesium  oxide (MAG-OX) 400 mg (241.3 mg elemental magnesium ) tablet Take 1 tablet (400 mg total) by mouth two (2) times a day. 180 tablet 3 03/10/2024 at  6:00 AM    potassium chloride  20 MEQ ER tablet Take 1 tablet (20 mEq total) by mouth two (2) times a day. 60 tablet 11 03/09/2024 at  6:00 AM    spironolactone (ALDACTONE) 25 MG tablet Take 1 tablet (25 mg total) by mouth daily. 90 tablet 1 03/10/2024 at  6:00 AM    thiamine  (B-1) 100 MG tablet Take 1 tablet (100 mg total) by mouth daily.   03/10/2024 Morning    vitamin E-268 mg, 400 UNIT, 268 mg (400 UNIT) capsule Take 800 mg by mouth in the morning. 2 tabs.   03/10/2024 Morning    acetaminophen  (TYLENOL ) 325 MG tablet Take 2 tablets (650 mg total) by mouth every six (6) hours as needed for pain.  0     [Paused] alendronate  (FOSAMAX ) 70 MG tablet Take 1 tablet (70 mg total) by mouth every seven (7) days. (  Patient not taking: Reported on 03/07/2024) 12 tablet 3     calcium carbonate (OS-CAL) 1,250 mg (500 mg elem calcium) tablet Take 1 tablet (500 mg elem calcium total) by mouth in the morning. (Patient not taking: Reported on 03/07/2024)       cyanocobalamin 1000 MCG tablet Take 1 tablet (1,000 mcg total) by mouth daily.       [EXPIRED] nystatin  (MYCOSTATIN ) 100,000 unit/gram powder Apply to affected area 3 times daily 15 g 0    [4]   Past Medical History:  Diagnosis Date    A-fib (CMS-HCC)     Alcoholism    (CMS-HCC)     Alcoholism /alcohol abuse     Cirrhosis    (CMS-HCC)     Depression     Hypertension     Liver disease     Malignant neoplasm of overlapping sites of right breast in female, estrogen receptor positive    (CMS-HCC) 10/03/2020   [5]   Past Surgical History:  Procedure Laterality Date    BREAST BIOPSY Right     benign-a long time ago    BREAST BIOPSY Right 07/2020    malignant    BREAST LUMPECTOMY Right     4 2022    CENTRAL LINE  03/25/2024    CHOLECYSTECTOMY      PR BX/REMV,LYMPH NODE,DEEP AXILL Right 09/03/2020    Procedure: BX/EXC LYMPH NODE; OPEN, DEEP AXILRY NODE;  Surgeon: Alm Elsie Como, MD;  Location: ASC [facility];  Service: Surgical Oncology Breast    PR CARDIOVERSION, ELECTIVE;EXTERN N/A 03/10/2024    Procedure: CARDIOVERSION, ELECTIVE, ELECTRICAL CONVERSION OF ARRHYTHMIA; EXTERNAL;  Surgeon: Sedalia Velma Hamilton, MD;  [facility];  Service: Cardiology    PR ECHO HEART,TRANSESOPHAGEAL,COMPLETE Midline 03/10/2024    Procedure: ECHOCARDIOGRAPHY, TRANSESOPHAGEAL, REAL-TIME WITH IMAGE DOCUMENTATION;  Surgeon: Sedalia Velma Hamilton, MD;  Location: [facility] OR [facility];  Service: Cardiology    PR INTRAOPERATIVE SENTINEL LYMPH NODE ID W DYE INJECTION Right 09/03/2020    Procedure: INTRAOPERATIVE IDENTIFICATION SENTINEL LYMPH NODE(S) INCLUDE INJECTION NON-RADIOACTIVE DYE, WHEN PERFORMED;  Surgeon: Alm Elsie Como, MD;  Location: ASC OR [facility];  Service: Surgical Oncology Breast    PR MASTECTOMY, PARTIAL Right 09/03/2020    Procedure: MASTECTOMY, PARTIAL (EG, LUMPECTOMY, TYLECTOMY, QUADRANTECTOMY, SEGMENTECTOMY);  Surgeon: Alm Elsie Como, MD;  Location: ASC OR [facility];  Service: Surgical Oncology Breast    PR TRACHEOSTOMY, PLANNED N/A 04/15/2024    Procedure: TRACHEOSTOMY PLANNED (SEPART PROC);  Surgeon: Pixie Loader, MD;  Location: OR [facility];  Service: ENT    PR UPPER GI ENDOSCOPY,BIOPSY N/A 02/03/2018    Procedure: UGI ENDOSCOPY; WITH BIOPSY, SINGLE OR MULTIPLE;  Surgeon: Eleanor Dewey Sorrel, MD;  Location: [facility];  Service: Gastroenterology    RADIATION Right     unsure finish date    TUBAL LIGATION     [6]   Social History  Socioeconomic History    Marital status: Married     Spouse name: None    Number of children: None    Years of education: None    Highest education level: None   Tobacco Use    Smoking status: Never     Passive exposure: Past    Smokeless tobacco: Never   Vaping Use    Vaping status: Never Used   Substance and Sexual Activity    Alcohol use: Not Currently    Drug use: Never    Sexual activity: Not Currently   Social History Narrative  Daughter fills med boxes weekly.      Social Drivers of Health     Food Insecurity: No Food Insecurity (03/02/2024)    Hunger Vital Sign     Worried About Running Out of Food in the Last Year: Never true     Ran Out of Food in the Last Year: Never true   Tobacco Use: Low Risk (03/30/2024)    Patient History     Smoking Tobacco Use: Never     Smokeless Tobacco Use: Never     Passive Exposure: Past   Transportation Needs: No Transportation Needs (03/02/2024)    PRAPARE - Transportation     Lack of Transportation (Medical): No     Lack of Transportation (Non-Medical): No   Alcohol Use: Not At Risk (04/20/2023)    Alcohol Use     How often do you have a drink containing alcohol?: Never     How many drinks containing alcohol do you have on a typical day when you are drinking?: 1 - 2     How often do you have 5 or more drinks on one occasion?: Never   Housing: Low Risk (03/02/2024)    Housing     Within the past 12 months, have you ever stayed: outside, in a car, in a tent, in an overnight shelter, or temporarily in someone else's home (i.e. couch-surfing)?: No     Are you worried about losing your housing?: No   Physical Activity: Inactive (04/20/2023)    Exercise Vital Sign     Days of Exercise per Week: 0 days     Minutes of Exercise per Session: 0 min   Utilities: Low Risk (04/20/2023)    Utilities     Within the past 12 months, have you been unable to get utilities (heat, electricity) when it was really needed?: No   Stress: No Stress Concern Present (04/20/2023)    [employer] of Occupational Health - Occupational Stress Questionnaire     Feeling of Stress : Only a little   Interpersonal Safety: Patient Unable To Answer (03/11/2024)    Interpersonal Safety     Unsafe Where You Currently Live: Patient unable to answer     Physically Hurt by Anyone: Patient unable to answer     Abused by Anyone: Patient unable to answer   Substance Use: Low Risk (04/20/2023) Substance Use     In the past year, how often have you used prescription drugs for non-medical reasons?: Never     In the past year, how often have you used illegal drugs?: Never     In the past year, have you used any substance for non-medical reasons?: No   Social Connections: Moderately Isolated (04/20/2023)    Social Connection and Isolation Panel     Frequency of Communication with Friends and Family: More than three times a week     Frequency of Social Gatherings with Friends and Family: More than three times a week     Attends Religious Services: Never     Database Administrator or Organizations: No     Attends Banker Meetings: Never     Marital Status: Married   Programmer, Applications: Low Risk (03/02/2024)    Overall Financial Resource Strain (CARDIA)     Difficulty of Paying Living Expenses: Not hard at all   Health Literacy: Low Risk (04/20/2023)    Health Literacy     : Never   Internet Connectivity: No Internet connectivity concern  identified (04/20/2023)    Internet Connectivity     Do you have access to internet services: Yes     How do you connect to the internet: Personal Device at home     Is your internet connection strong enough for you to watch video on your device without major problems?: Yes     Do you have enough data to get through the month?: Yes     Does at least one of the devices have a camera that you can use for video chat?: Yes   [7]   Family History  Problem Relation Age of Onset    Heart disease Mother     No Known Problems Father     No Known Problems Sister     No Known Problems Daughter     No Known Problems Maternal Grandmother     No Known Problems Maternal Grandfather     No Known Problems Paternal Grandmother     No Known Problems Paternal Grandfather     Lupus Brother     No Known Problems Other     BRCA 1/2 Neg Hx     Breast cancer Neg Hx     Cancer Neg Hx     Colon cancer Neg Hx     Endometrial cancer Neg Hx     Ovarian cancer Neg Hx     Mental illness Neg Hx     Substance Abuse Disorder Neg Hx

## 2024-04-19 LAB — BASIC METABOLIC PANEL
ANION GAP: 10 mmol/L (ref 5–14)
BLOOD UREA NITROGEN: 19 mg/dL (ref 9–23)
BUN / CREAT RATIO: 42
CALCIUM: 8.5 mg/dL — ABNORMAL LOW (ref 8.7–10.4)
CHLORIDE: 109 mmol/L — ABNORMAL HIGH (ref 98–107)
CO2: 28 mmol/L (ref 20.0–31.0)
CREATININE: 0.45 mg/dL — ABNORMAL LOW (ref 0.55–1.02)
EGFR CKD-EPI (2021) FEMALE: >90 mL/min/1.73m2 (ref >=60)
GLUCOSE RANDOM: 155 mg/dL (ref 70–179)
POTASSIUM: 3.8 mmol/L (ref 3.4–4.8)
SODIUM: 147 mmol/L — ABNORMAL HIGH (ref 135–145)

## 2024-04-19 LAB — HEPATIC FUNCTION PANEL
ALBUMIN: 2.7 g/dL — ABNORMAL LOW (ref 3.4–5.0)
ALKALINE PHOSPHATASE: 166 U/L — ABNORMAL HIGH (ref 46–116)
ALT (SGPT): 14 U/L (ref 10–49)
AST (SGOT): 31 U/L (ref <=34)
BILIRUBIN DIRECT: 0.5 mg/dL — ABNORMAL HIGH (ref 0.00–0.30)
BILIRUBIN TOTAL: 1 mg/dL (ref 0.3–1.2)
PROTEIN TOTAL: 5.5 g/dL — ABNORMAL LOW (ref 5.7–8.2)

## 2024-04-19 LAB — SLIDE REVIEW

## 2024-04-19 LAB — BLOOD GAS, VENOUS
BASE EXCESS VENOUS: 3 — ABNORMAL HIGH (ref -2.0–2.0)
BASE EXCESS VENOUS: 1.7 (ref -2.0–2.0)
CARBOXYHEMOGLOBIN, VENOUS: 1.9 % — ABNORMAL HIGH (ref <1.2)
CARBOXYHEMOGLOBIN, VENOUS: 1.9 % — ABNORMAL HIGH (ref <1.2)
HCO3 VENOUS: 28 mmol/L — ABNORMAL HIGH (ref 22–27)
HCO3 VENOUS: 26 mmol/L (ref 22–27)
METHEMOGLOBIN, VENOUS: <1 % (ref <1.5)
METHEMOGLOBIN, VENOUS: <1 % (ref <1.5)
O2 SATURATION VENOUS: 74.6 % (ref 40.0–85.0)
O2 SATURATION VENOUS: 79.1 % (ref 40.0–85.0)
OXYHEMOGLOBIN, VENOUS: 72.7 % (ref 40.0–85.0)
OXYHEMOGLOBIN, VENOUS: 77.3 % (ref 40.0–85.0)
PCO2 VENOUS: 46 mmHg (ref 40–60)
PCO2 VENOUS: 43 mmHg (ref 40–60)
PH VENOUS: 7.4 (ref 7.32–7.43)
PH VENOUS: 7.4 (ref 7.32–7.43)
PO2 VENOUS: 42 mmHg (ref 30–55)
PO2 VENOUS: 48 mmHg (ref 30–55)

## 2024-04-19 LAB — CBC W/ AUTO DIFF
BASOPHILS ABSOLUTE COUNT: 0 10*9/L (ref 0.0–0.1)
BASOPHILS RELATIVE PERCENT: 0.4 %
EOSINOPHILS ABSOLUTE COUNT: 0.1 10*9/L (ref 0.0–0.5)
EOSINOPHILS RELATIVE PERCENT: 2.2 %
HEMATOCRIT: 25 % — ABNORMAL LOW (ref 34.0–44.0)
HEMOGLOBIN: 8.2 g/dL — ABNORMAL LOW (ref 11.3–14.9)
LYMPHOCYTES ABSOLUTE COUNT: 1 10*9/L — ABNORMAL LOW (ref 1.1–3.6)
LYMPHOCYTES RELATIVE PERCENT: 22.7 %
MEAN CORPUSCULAR HEMOGLOBIN CONC: 32.9 g/dL (ref 32.0–36.0)
MEAN CORPUSCULAR HEMOGLOBIN: 32.6 pg — ABNORMAL HIGH (ref 25.9–32.4)
MEAN CORPUSCULAR VOLUME: 99.1 fL — ABNORMAL HIGH (ref 77.6–95.7)
MEAN PLATELET VOLUME: 9.4 fL (ref 6.8–10.7)
MONOCYTES ABSOLUTE COUNT: 0.5 10*9/L (ref 0.3–0.8)
MONOCYTES RELATIVE PERCENT: 11.9 %
NEUTROPHILS ABSOLUTE COUNT: 2.7 10*9/L (ref 1.8–7.8)
NEUTROPHILS RELATIVE PERCENT: 62.8 %
PLATELET COUNT: 133 10*9/L — ABNORMAL LOW (ref 150–450)
RED BLOOD CELL COUNT: 2.52 10*12/L — ABNORMAL LOW (ref 3.95–5.13)
RED CELL DISTRIBUTION WIDTH: 19.9 % — ABNORMAL HIGH (ref 12.2–15.2)
WBC ADJUSTED: 4.4 10*9/L (ref 3.6–11.2)

## 2024-04-19 LAB — AMMONIA: AMMONIA: 65 umol/L — ABNORMAL HIGH (ref 11–32)

## 2024-04-19 LAB — CYSTATIN C
CYSTATIN C: 1.58 mg/L — ABNORMAL HIGH (ref 0.64–1.23)
EGFR CKD-EPI (2012) CYSTATIN C FEMALE: 37 mL/min/1.73m2 — ABNORMAL LOW (ref >=60)

## 2024-04-19 LAB — APTT
APTT: 82.6 s — ABNORMAL HIGH (ref 24.8–38.4)
HEPARIN CORRELATION: 0.5

## 2024-04-19 LAB — FOLATE: FOLATE: >24 ng/mL (ref >=5.4)

## 2024-04-19 LAB — PHOSPHORUS: PHOSPHORUS: 2.8 mg/dL (ref 2.4–5.1)

## 2024-04-19 LAB — MAGNESIUM: MAGNESIUM: 2.2 mg/dL (ref 1.6–2.6)

## 2024-04-19 MED ADMIN — sodium chloride (NS) 0.9 % flush 10 mL: 10 mL | INTRAVENOUS | @ 16:00:00

## 2024-04-19 MED ADMIN — sodium chloride (NS) 0.9 % flush 10 mL: 10 mL | INTRAVENOUS | @ 23:00:00

## 2024-04-19 MED ADMIN — rifAXIMin (XIFAXAN) oral suspension: 550 mg | GASTROENTERAL | @ 14:00:00 | Stop: 2025-04-11

## 2024-04-19 MED ADMIN — rifAXIMin (XIFAXAN) oral suspension: 550 mg | GASTROENTERAL | @ 01:00:00 | Stop: 2025-04-11

## 2024-04-19 MED ADMIN — lacosamide (VIMPAT) injection 400 mg: 400 mg | INTRAVENOUS | @ 04:00:00 | Stop: 2024-04-18

## 2024-04-19 MED ADMIN — lactulose oral solution: 30 g | GASTROENTERAL | @ 14:00:00

## 2024-04-19 MED ADMIN — lactulose oral solution: 30 g | GASTROENTERAL | @ 01:00:00

## 2024-04-19 MED ADMIN — lactulose oral solution: 30 g | GASTROENTERAL | @ 09:00:00

## 2024-04-19 MED ADMIN — lactulose oral solution: 30 g | GASTROENTERAL | @ 16:00:00

## 2024-04-19 MED ADMIN — lactulose oral solution: 30 g | GASTROENTERAL | @ 04:00:00

## 2024-04-19 MED ADMIN — lactulose oral solution: 30 g | GASTROENTERAL | @ 08:00:00

## 2024-04-19 MED ADMIN — lactulose oral solution: 30 g | GASTROENTERAL | @ 06:00:00

## 2024-04-19 MED ADMIN — lactulose oral solution: 30 g | GASTROENTERAL | @ 18:00:00

## 2024-04-19 MED ADMIN — folic acid (FOLVITE) tablet 1 mg: 1 mg | GASTROENTERAL | @ 14:00:00

## 2024-04-19 MED ADMIN — thiamine mononitrate (vit B1) tablet 100 mg: 100 mg | GASTROENTERAL | @ 14:00:00

## 2024-04-19 MED ADMIN — cyanocobalamin (vitamin B-12) tablet 1,000 mcg: 1000 ug | GASTROENTERAL | @ 14:00:00

## 2024-04-19 MED ADMIN — lacosamide (VIMPAT) injection 100 mg: 100 mg | INTRAVENOUS | @ 14:00:00

## 2024-04-19 MED ADMIN — famotidine (PEPCID) tablet 20 mg: 20 mg | GASTROENTERAL | @ 01:00:00

## 2024-04-19 MED ADMIN — famotidine (PEPCID) tablet 20 mg: 20 mg | GASTROENTERAL | @ 14:00:00

## 2024-04-19 MED ADMIN — melatonin tablet 3 mg: 3 mg | GASTROENTERAL | @ 23:00:00

## 2024-04-19 MED ADMIN — esomeprazole (NEXIUM) granules 40 mg: 40 mg | GASTROENTERAL | @ 14:00:00

## 2024-04-19 MED ADMIN — multivitamins, therapeutic with minerals tablet 1 tablet: 1 | GASTROENTERAL | @ 14:00:00

## 2024-04-19 MED ADMIN — levETIRAcetam (KEPPRA) injection 1,000 mg: 1000 mg | INTRAVENOUS | @ 14:00:00

## 2024-04-19 MED ADMIN — heparin 25,000 Units/250 mL (100 units/mL) in 0.45% saline infusion (premade): 0-24 [IU]/kg/h | INTRAVENOUS | @ 08:00:00

## 2024-04-19 MED ADMIN — midazolam (VERSED) injection 0.5 mg: .5 mg | INTRAVENOUS | @ 02:00:00 | Stop: 2024-04-18

## 2024-04-19 MED ADMIN — gadopiclenol (ELUCIREM,VUEWAY) injection 10 mL: 10 mL | INTRAVENOUS | @ 02:00:00 | Stop: 2024-04-18

## 2024-04-19 MED ADMIN — potassium chloride 20 mEq in 100 mL IVPB Premix: 20 meq | INTRAVENOUS | @ 12:00:00 | Stop: 2024-04-19

## 2024-04-19 NOTE — Consults (Signed)
 Nephrology Consult Note    Requesting Attending Physician :  Morene ONEIDA Dec, MD  Service Requesting Consult : Medical ICU (MDI)  Reason for Consult: High ammonia levels in the setting of refractory encephalopathy and consideration of dialysis    Assessment and Plan:78 year old female with past medical history of HFpEF, hypertension, A-fib, MASLD cirrhosis with prolonged hospital course complicated by acute hypoxic hypercapnic respiratory failure requiring trach due to multiple failed extubations largely from encephalopathy and inability to protect airway.  Encephalopathy undifferentiated at this point but differentials include mainly hepatic encephalopathy versus other metabolic encephalopathy.  Did develop seizures for which neurology is consulted and she is currently on antiseizure medication.  MRI brain with diffuse cortical restricted diffusion gradients in the bilateral parieto-occipital regions.  Nephrology consulted for evaluation of hyperammonemia and consideration of dialysis.     # Hyperammonemia, c/f worsening encephalopathy, seizures   Discussed with the MICU team, that it would be reasonable to trial dialysis if ammonia levels continue to trend upwards despite lactulose  therapy.  Likely her ammonemia is from hepatic encephalopathy, but can consider the following if refractory such as upper GI bleed, urease producing bacterial infection specifically in the urinary tract or intra-abdominally.  Does not appear that she is on any drugs that can cause elevated ammonia levels.  We did advise to recheck ammonia levels as she has had significant more bowel movements since lactulose  has been dosed every 2 hrs.  Fortunately her repeat ammonia levels have come back down to 65 from 291 2 days prior and therefore gut excretion is likely appropriate at this time for minimizing any hepatic encephalopathy that may be contributing to her overall presentation.      RECOMMENDATIONS:   -If further refractory ammonia levels develop, please feel free to reengage nephrology for further discussion of dialysis  - Because ammonia levels have downtrended appropriately, will hold off on hemodialysis at this time.  - Thank you for this interesting consult.  We will sign-off.     Lucia Hutching, MD  04/19/2024 2:41 PM     Medical decision-making for 04/19/24  Findings / Data     Patient has: []  acute illness w/systemic sxs  [mod]  []  two or more stable chronic illnesses [mod]  []  one chronic illness with acute exacerbation [mod]  []  acute complicated illness  [mod]  []  Undiagnosed new problem with uncertain prognosis  [mod] []  illness posing risk to life or bodily function (ex. AKI)  [high]  []  chronic illness with severe exacerbation/progression  [high]  []  chronic illness with severe side effects of treatment  [high] Hyperammonemia  Encephalopathy  Seizures  Probs At least 2:  Probs, Data, Risk   I reviewed: [x]  primary team note  [x]  consultant note(s)  []  external records [x]  chemistry results  [x]  CBC results  []  blood gas results  []  Other []  procedure/op note(s)   []  radiology report(s)  []  micro result(s)  []  w/ independent historian(s) Elevated ammonia levels   >=3 Data Review (2 of 3)    I independently interpreted: []  Urine Sediment  []  Renal US  []  CXR Images  []  CT Images  []  Other []  EKG Tracing  Any     I discussed: []  Pathology results w/ QHPs(s) from other specialties  []  Procedural findings w/ QHPs(s) from other specialties []  Imaging w/ QHP(s) from other specialties  [x]  Treatment plan w/ QHP(s) from other specialties Plan discussed with primary team Any     Mgm't requires: []  Prescription drug(s)  [  mod]  []  Kidney biopsy  [mod]  []  Central line placement  [mod] []  High risk medication use and/or intensive toxicity monitoring [high]  []  Renal replacement therapy [high]  []  High risk kidney biopsy  [high]  []  Escalation of care  [high]  []  High risk central line placement  [high] High risk due to encephalopathy Risk      ____________________________________________________    History of Present Illness:   79 year old female with past medical history of HFpEF, hypertension, A-fib, MASLD cirrhosis who initially presented as a transfer from [facility] for carotid artery injury to be evaluated by vascular surgery.  She was status postcardiac arrest and had ROSC on 10/24 and at that point was intubated.  She was transferred back to the MICU and initially was then a shock state but has weaned off pressors since 10/27.  She was extubated on 10/31 and reportedly her mentation improved however on 11/2 she had increased lethargy and again was not protecting her airway and was reintubated, self extubated on 11/5 and reintubated on 11/6 she now has a tracheostomy.  This was placed on 11/14.  In the past 24 hours, she has had worsening neurologic status with concern for focal seizures.  She was visualized to have eye deviation and marked decreased responsiveness on exam.  Her MRI brain demonstrated diffuse cortical restricted diffusion greatest in the bilateral.  I added p.o. occipital regions which can be seen with prolonged seizures or progress that has progressed infarction other differentials included hypoglycemic encephalopathy, acute hyperammonemic encephalopathy and hypoglycemic hypoxic ischemic encephalopathy.  Nephrology consulted for high ammonia levels despite lactulose  therapy to see if dialysis can help with lowering ammonia levels, improving encephalopathy..  Does appear that for the past 2 days, patient has not had significant bowel movements that were charted.  In the past 24 hours with the bowel management system she has put out approximately 1.8 L of stool.  She is now on lactulose  Q2 along with rifaximin .  Ammonia level was 291 on 11/16.  Prior to that it was measured on 10/24 and it was 160.    MRI brain 11/17:   Impression   --Diffuse cortical restricted diffusion greatest within the bilateral parieto-occipital regions. Constellation of findings may be seen in the setting of prolonged seizures or PRES which has progressed to infarction. Other differential considerations include hypoglycemic encephalopathy, acute hyperammonemic encephalopathy (although this typically involves the insula with sparing of the occipital lobes), hypoglycemia hypoxic ischemic encephalopathy, and Creutzfeldt-Jakob disease (although this would be expected to present with a more protracted subacute course).       INPATIENT MEDICATIONS:  Current Medications[1]    OUTPATIENT MEDICATIONS:  Prior to Admission medications   Medication Dose, Route, Frequency   anastrozole  (ARIMIDEX ) 1 mg tablet 1 mg, Oral, Daily (standard)   carvedilol  (COREG ) 3.125 MG tablet 3.125 mg, Oral, 2 times a day (standard)   folic acid  (FOLVITE ) 1 MG tablet 1 mg, Daily (standard)   furosemide (LASIX) 40 MG tablet 40 mg, Oral, Daily PRN   magnesium  oxide (MAG-OX) 400 mg (241.3 mg elemental magnesium ) tablet 400 mg, Oral, 2 times a day (standard)   potassium chloride  20 MEQ ER tablet 20 mEq, Oral, 2 times a day (standard)   spironolactone (ALDACTONE) 25 MG tablet 25 mg, Oral, Daily (standard)   thiamine  (B-1) 100 MG tablet 100 mg, Daily (standard)   vitamin E-268 mg, 400 UNIT, 268 mg (400 UNIT) capsule 800 mg, Oral, Daily (standard), 2 tabs   acetaminophen  (TYLENOL ) 325  MG tablet 650 mg, Oral, Every 6 hours PRN   [Paused] alendronate  (FOSAMAX ) 70 MG tablet 70 mg, Oral, Every 7 days  Patient not taking: Reported on 03/07/2024  Paused since Fri 03/04/2024 until manually resumed   calcium  carbonate (OS-CAL) 1,250 mg (500 mg elem calcium ) tablet 1 tablet, Daily (standard)  Patient not taking: Reported on 03/07/2024   cyanocobalamin  1000 MCG tablet 1,000 mcg, Daily (standard)   empagliflozin  (JARDIANCE ) 10 mg tablet 10 mg, Oral, Daily (standard), For benefit inquiry only. Please send Epic chat message to Ashley Blue with coverage information, then discontinue Rx. ALLERGIES:  Rocuronium , Sulfa (sulfonamide antibiotics), Sulfur , and Aspirin    MEDICAL HISTORY:  Past Medical History[2]  Past Surgical History[3]  SOCIAL HISTORY  Social History     Social History Narrative    Daughter fills med boxes weekly.       reports that she has never smoked. She has been exposed to tobacco smoke. She has never used smokeless tobacco. She reports that she does not currently use alcohol. She reports that she does not use drugs.   FAMILY HISTORY  Family History[4]     Physical Exam:   Vitals:    04/19/24 1200 04/19/24 1300 04/19/24 1343 04/19/24 1400   BP: 162/88 149/60  172/65   Pulse: 121 115 125 112   Resp: (!) 32 21 (!) 32 (!) 36   Temp: 37.1 ??C (98.7 ??F)      TempSrc: Axillary      SpO2: 99% 99% 98% 98%   Weight:       Height:         I/O this shift:  In: 1646.3 [I.V.:91.3; NG/GT:1555]  Out: 1500 [Stool:1500]    Intake/Output Summary (Last 24 hours) at 04/19/2024 1441  Last data filed at 04/19/2024 1400  Gross per 24 hour   Intake 3777.15 ml   Output 3700 ml   Net 77.15 ml     Constitutional: nonresponsive, does not respond to sternal rub, or nailbed pressure  Heart: RRR, no m/r/g  Lungs: CTAB, normal wob, trach  Abd: soft, non-tender, non-distended  Ext: 1+ edema          [1]   Current Facility-Administered Medications:     acetaminophen  (TYLENOL ) tablet 650 mg, Oral, Q4H PRN    cyanocobalamin  (vitamin B-12) tablet 1,000 mcg, Enteral tube: gastric, Daily    esomeprazole  (NEXIUM ) granules 40 mg, Enteral tube: gastric, Daily    famotidine  (PEPCID ) tablet 20 mg, Enteral tube: gastric, BID    flu vacc 2025-26 (65 yr up) (PF)(FLUAD)45 mcg(25mcgx3)/0.5 ml IM syringe, Intramuscular, During hospitalization    folic acid  (FOLVITE ) tablet 1 mg, Enteral tube: gastric, Daily    heparin  (porcine) 1000 unit/mL injection 2,000 Units, Intravenous, Q6H PRN    heparin  25,000 Units/250 mL (100 units/mL) in 0.45% saline infusion (premade), Intravenous, Continuous    lacosamide  (VIMPAT ) injection 100 mg, Intravenous, BID    lactulose  oral solution, Enteral tube: gastric, Q2H    [COMPLETED] levETIRAcetam  (KEPPRA ) injection 3,500 mg, Intravenous, Once **FOLLOWED BY** levETIRAcetam  (KEPPRA ) injection 1,000 mg, Intravenous, Q12H SCH    melatonin tablet 3 mg, Enteral tube: gastric, QPM    [Provider Hold] metoPROLOL  tartrate (Lopressor ) tablet 25 mg, Enteral tube: gastric, BID    multivitamins, therapeutic with minerals tablet 1 tablet, Enteral tube: gastric, Daily    NORepinephrine  8 mg in dextrose  5 % 250 mL (32 mcg/mL) infusion PMB, Intravenous, Continuous    rifAXIMin  (XIFAXAN ) oral suspension, Enteral tube: gastric, BID  sodium chloride  (NS) 0.9 % flush 10 mL, Intravenous, Q8H    sodium chloride  (NS) 0.9 % flush 10 mL, Intravenous, Q8H    thiamine  mononitrate (vit B1) tablet 100 mg, Enteral tube: gastric, Daily  [2]   Past Medical History:  Diagnosis Date    A-fib (CMS-HCC)     Alcoholism    (CMS-HCC)     Alcoholism /alcohol abuse     Cirrhosis    (CMS-HCC)     Depression     Hypertension     Liver disease     Malignant neoplasm of overlapping sites of right breast in female, estrogen receptor positive    (CMS-HCC) 10/03/2020   [3]   Past Surgical History:  Procedure Laterality Date    BREAST BIOPSY Right     benign-a long time ago    BREAST BIOPSY Right 07/2020    malignant    BREAST LUMPECTOMY Right     4 2022    CENTRAL LINE  03/25/2024    CHOLECYSTECTOMY      PR BX/REMV,LYMPH NODE,DEEP AXILL Right 09/03/2020    Procedure: BX/EXC LYMPH NODE; OPEN, DEEP AXILRY NODE;  Surgeon: Alm Elsie Como, MD;  Location: [facility] OR [facility];  Service: Surgical Oncology Breast    PR CARDIOVERSION, ELECTIVE;EXTERN N/A 03/10/2024    Procedure: CARDIOVERSION, ELECTIVE, ELECTRICAL CONVERSION OF ARRHYTHMIA; EXTERNAL;  Surgeon: Sedalia Velma Hamilton, MD;  Location[facility] OR [facility];  Service: Cardiology    PR ECHO HEART,TRANSESOPHAGEAL,COMPLETE Midline 03/10/2024    Procedure: ECHOCARDIOGRAPHY, TRANSESOPHAGEAL, REAL-TIME WITH IMAGE DOCUMENTATION;  Surgeon: Sedalia Velma Hamilton, MD;  Location: [facility] OR [facility];  Service: Cardiology    PR INTRAOPERATIVE SENTINEL LYMPH NODE ID W DYE INJECTION Right 09/03/2020    Procedure: INTRAOPERATIVE IDENTIFICATION SENTINEL LYMPH NODE(S) INCLUDE INJECTION NON-RADIOACTIVE DYE, WHEN PERFORMED;  Surgeon: Alm Elsie Como, MD;  [facility]: [facility];  Service: Surgical Oncology Breast    PR MASTECTOMY, PARTIAL Right 09/03/2020    Procedure: MASTECTOMY, PARTIAL (EG, LUMPECTOMY, TYLECTOMY, QUADRANTECTOMY, SEGMENTECTOMY);  Surgeon: Alm Elsie Como, MD;  Location: [facility] OR [facility];  Service: Surgical Oncology Breast    PR TRACHEOSTOMY, PLANNED N/A 04/15/2024    Procedure: TRACHEOSTOMY PLANNED (SEPART PROC);  Surgeon: Pixie Loader, MD;  Location: OR [facility];  Service: ENT    PR UPPER GI ENDOSCOPY,BIOPSY N/A 02/03/2018    Procedure: UGI ENDOSCOPY; WITH BIOPSY, SINGLE OR MULTIPLE;  Surgeon: Eleanor Dewey Sorrel, MD;  Location: [facility] PROCED[facility];  Service: Gastroenterology    RADIATION Right     unsure finish date    TUBAL LIGATION     [4]   Family History  Problem Relation Age of Onset    Heart disease Mother     No Known Problems Father     No Known Problems Sister     No Known Problems Daughter     No Known Problems Maternal Grandmother     No Known Problems Maternal Grandfather     No Known Problems Paternal Grandmother     No Known Problems Paternal Grandfather     Lupus Brother     No Known Problems Other     BRCA 1/2 Neg Hx     Breast cancer Neg Hx     Cancer Neg Hx     Colon cancer Neg Hx     Endometrial cancer Neg Hx     Ovarian cancer Neg Hx     Mental illness Neg Hx     Substance Abuse Disorder Neg Hx

## 2024-04-19 NOTE — Consults (Signed)
 Palliative Care Progress Note        Consultation from Requesting Attending Physician:  Morene ONEIDA Dec, MD  Primary Care Provider:  Alyse Slater Pao, MD      Assessment/Plan:      SUMMARY:  This 78 y.o. 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    Today's visit addressed GOC in light of further decompensation.    Symptom Assessment and Recommendations:    Per primary team.    Goals of care / ACP:  Code Status:   Code Status: Full Code   Healthcare decision-maker if lacks capacity:   HCDM (patient stated preference): Vicknair,[name]    HCDM (patient stated preference): Klooster,[name] - Daughter - [phone]     Goals of care as discussed on 11/18:  - [name] shares this has been a tough week as her mom has gotten sicker, and her dad has made it out of rehab and is living with her, requiring a lot of care and supervision.  She is continuing to work as well and care for her children.  - She has heard that her mom's mental status has worsened and she has not been waking up.  She has heard there is concern about her ammonia levels, and potential seizures.  - She is worried, and at the same time holding out hope still that her mom could improve from this  - She is amenable to a family meeting with PC and ICU teams, although unsure of timing due to her work schedule and other responsibilities    Goals of care as discussed on 11/12:  - Please see ACP note from same day for further details.  - In brief, presented family with options to proceed with tracheostomy versus extubation without a plan for reintubation if she were to fail (plan at that time would be to transition to comfort focused care and allow natural death)  - Family affirmed Raynee's ultimate goals would be to eventually get back home and lead a functional life.  They believe she would be willing to go through a prolonged hospitalization, tracheostomy and rehab if there is some hope of reaching that goal.  ICU team shared that they thought while the road ahead would be long and difficult, but this was definitely possible and that recent indicators have showed that Joycelynn is slowly improving.  - Patient and family ultimately decided to proceed with tracheostomy, timing TBD    Goals of care as discussed on 11/10:  - Zeyna's family shares this has been a long and difficult hospitalization, with lots of unexpected decompensations  - They share that before this hospitalization, Raylynn was doing well at home.  She was independent.  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  This is a MAJOR change for her.  - They would like to talk about the risks, benefits, and alternatives to tracheostomy at a meeting together with the medical team on Wednesday.  [name] is going to call her sister Sari to discuss what time they are able to meet.  - The ICU team shares that in the past, there have been questions from the family about if they would offer extubation and  reintubation if indicated for a 5th time (as an alternative to proceeding straight to tracheostomy).  They would likely not offer this due to substantial risks without much benefit.    Practical, Emotional, Spiritual Support Recommendations:  Patient's daughters would like the patient to be included in decision making as much as possible    Recommendations discussed with primary team in person    Thank you for this consult. Please contact Darryle Dustman MD or page Palliative Care if there are any questions.  Palliative Care team will continue to follow.    Subjective:     Recent Events:  Palliative care re-consulted due to decline in the past few days.  Per primary team, she missed a couple of doses of lactulose  due to large volume of liquid BM's and experienced a rapid decline in her mental status.  The team worries that if something this small caused such a significant decline, her shot at getting out of the hospital and back to a QOL that would be acceptable to her are quite slim.  She also appears to be having new seizures or seizure-like activity within the past few days which is a new complication.    [name] reports this has been a rough week.  To further complicate things, her dad just got out of rehab and is back at home now which has been very challenging as he needs a lot of care.  She has a lot on her plate.  She is still trying to remain hopeful for her mom to get better.  She hopes to come visit either this afternoon or later this week.      Objective:       Function:  30% - Ambulation: Totally Bed Bound / Unable to do any work, extensive disease / Self-Care: Total care / Intake: Reduced / Level of Conscious: Full, drowsy, or confusion    Temp:  [36.9 ??C (98.5 ??F)-37.8 ??C (100 ??F)] 37.2 ??C (98.9 ??F)  Pulse:  [91-127] 113  SpO2 Pulse:  [92-131] 116  Resp:  [14-33] 33  BP: (100-172)/(32-87) 171/70  FiO2 (%):  [30 %] 30 %  SpO2:  [94 %-100 %] 99 %    Physical Exam:  General:  Ill appearing woman in NAD, somnolent and minimally response  HEENT:  Normocephalic, s/p trach  Eyes:  PERRL  Cardiovascular:  Tachycardic rate, regular rhythm, systolic murmur  Pulmonary:  Rhonchorous breath sounds on ventilator  Gastrointestinal:  Distended  Ext:  Worsening pitting bilateral LEE, also present in UE  Skin:  No rashes on clothed exam  Psych: Somnolent, unresponsive to voice or touch    Testing reviewed and interpreted:  Reviewed and interpreted test results for AKI, anemia affecting assessment of underlying illness severity and prognosis    I personally spent 45 minutes face-to-face and non-face-to-face in the care of this patient, which includes all pre, intra, and post visit time on the date of service.  All documented time was specific to the E/M visit and does not include any procedures that may have been performed.     See ACP Note from today for additional billable service: No.    Darryle Dustman MD  HPM Fellow

## 2024-04-19 NOTE — Plan of Care (Signed)
 Shift Summary  Ventilator mode was changed from [facility] to PSV-CPAP, and pressure support was reduced during the shift.  Anticonvulsant medications lacosamide  and levETIRAcetam  were administered in the morning.  CBC revealed anemia, thrombocytopenia, and abnormal smear findings.  Ammonia level was found to be elevated in the afternoon, but improved from prior.  Remained on mechanical ventilation with persistent altered mental status and stable oxygenation parameters throughout the shift.    Absence of Fall and Fall-Related Injury: Hourly visual checks, bed alarm, and fall reduction interventions were consistently maintained throughout the shift with no reported falls or injuries.    Optimal Gas Exchange: SpO2 remained at 98-99% throughout the shift, and airway patency was maintained with humidified oxygen and pulmonary hygiene promoted; however, respiratory rate trended higher in the afternoon.    Optimal Cognitive Function: CAM-ICU remained positive for altered consciousness and inattention, with continued inability to follow commands and decreased safety awareness documented throughout the shift.    Mechanical Ventilation Liberation: Ventilator mode was changed from [facility] to PSV-CPAP, and pressure support was reduced from 16 to 12 cm H2O.     Absence of Ventilator-Induced Lung Injury: Plateau pressures decreased from 21 to 17 cm H2O, and exhaled tidal volume per IBW decreased over the shift, with PEEP maintained at 5 cm H2O.

## 2024-04-19 NOTE — Consults (Signed)
 PHYSICAL THERAPY  Re-Evaluation (04/19/24 1016)          Patient Name:  [name]       Medical Record Number: [id number]   Date of Birth: [birth year]  Sex: Female        Post-Discharge Physical Therapy Recommendations:  PT Post Acute Discharge Recommendations: Low intensity, 5x weekly   Discharge transport recommendations : Stretcher/ambulance  Equipment Recommendation  PT DME Recommendations: Defer to post acute          Treatment Diagnosis: Abnormalities of gait and mobility, Difficulty in walking, Generalized muscle weakness, Lower extremity weakness        ASSESSMENT  Problem List: Decreased cognition, Decreased endurance, Decreased mobility, Decreased skin integrity, Decreased strength, Decreased range of motion, Fall risk, Increased edema, Impaired ADLs      Assessment : Patient now s/p tracheostomy on 11/14, presents to PT re-eval with impaired cognition, decreased arousal, decreased endurance, and generalized weakness limiting functional mobility compared to functional baseline. Patient now not following commands with decreased arousal to stimuli during session. Given patient's current functional status, decreasing frequency. Patient will continue to benefit from skilled acute PT and 5xL post-acute to address above listed deficits.      Today's Interventions: PT re-eval, upright tolerance in bed in chair position, PROM of all extremities, positioning; Pt Education: PT role, POC      Activity Tolerance: Limited by Mental Status       PLAN  Planned Frequency of Treatment: Plan of Care Initiated: 04/19/24  1-2x per day Weekly Frequency: 1-2 days per week  Planned Treatment Duration: 05/03/24     Planned Interventions: Education (Patient/Family/Caregiver), Gait training, Home exercise program, Neuromuscular re-education, Self-care / Home Management training, Therapeutic Exercise, Therapeutic Activity     Goals:   Patient and Family Goals: not stated     SHORT GOAL #1: Patient will roll with modA +1. Time Frame : 2 weeks  SHORT GOAL #2: Patient will perform sup<>sit with maxA +1.              Time Frame : 2 weeks  SHORT GOAL #3: Patient will participate in EOB/OOB assessment.              Time Frame : 2 weeks                       Long Term Goal #1: TBD pending further mobility assessment.  Time Frame: 4 weeks     Prognosis:  Guarded  Positive Indicators: PLOF  Barriers to Discharge: Inaccessible home environment, Decreased safety awareness, Endurance deficits, Cognitive deficits, Inability to safely perform ADLS, Functional strength deficits, Decreased range of motion, Severity of deficits     SUBJECTIVE  Communication Preference: Verbal, Visual     Patient reports: RN cleared for PT  Pain Comments: no signs of pain  Medical Updates Since Last Visit/Relevant PMH Affecting Clinical Decision Making: s/p tracheostomy 11/14     Prior Functional Status: Per prior PT eval 10/3: IND at home for mobility and self care, lives with spouse, daughter, and granddaughter. Daughter and granddaughter can assist as needed. Mostly sedentary, does not leave the house much. Denies recent falls  Living Situation  Living Environment: House  Lives With: Spouse, Daughter, Family  Home Living: Multi-level home, Walk-in shower, Raised toilet seat without rails, Stairs to enter with rails, Stairs to alternate level with rails, Built-in shower seat  Rail placement (outside): Bilateral rails in reach  Number of Stairs to  Enter (outside): 9  Rail placement (inside): Bilateral rails in reach  Number of Stairs to Alternate level (inside):  (needs clarification)      Equipment available at home: Rollator, Rolling walker        Past Medical History[1]         Social History     Tobacco Use    Smoking status: Never     Passive exposure: Past    Smokeless tobacco: Never   Substance Use Topics    Alcohol use: Not Currently       Past Surgical History[2]          Family History[3]     Allergies: Rocuronium, Sulfa (sulfonamide antibiotics), Sulfur , and Aspirin                  Objective Findings  Precautions / Restrictions  Precautions: Falls precautions, Delirium Precautions, Aspiration precautions  Required Braces or Orthoses: Non-applicable        Equipment / Environment: Arterial line, Foley, NGT, Restraints, Rectal bag, Telemetry, Vascular access (PIV, TLC, Port-a-cath, PICC), Ventilatory support, Tracheostomy, EEG/VEEG     Vitals/Orthostatics : VSS throughout session per tele. At end of session: HR 117, BP 177/86(116), SpO2 99% on vent     Cognition: Unable to follow commands  Cognition comment: pt partially opening eyes once during session, not following commands  Orientation: Unable to test  Visual/Perception: Wears Glasses/Contacts all the time  Hearing:  (unable to assess)     Skin Inspection: Swelling, Poor skin integrity  Skin Inspection comment: edema in all extremities     Upper Extremities  UE ROM: Right Impaired/Limited, Left Impaired/Limited  RUE ROM Impairment: Limited AROM  LUE ROM Impairment: Limited AROM  UE Strength: Right Impaired/Limited, Left Impaired/Limited  RUE Strength Impairment: Reduced strength  LUE Strength Impairment: Reduced strength  UE comment: no active movement in UEs observed    Lower Extremities  LE ROM: Left Impaired/Limited, Right Impaired/Limited  RLE ROM Impairment: Limited AROM  LLE ROM Impairment: Limited AROM  LE Strength: Right Impaired/Limited, Left Impaired/Limited  RLE Strength Impairment: Reduced strength  LLE Strength Impairment: Reduced strength  LE comment: intermittent extension of toes with repositioning of LE; decreased PROM of B knee/hip flexion          Coordination: Not tested  Proprioception: Not tested  Sensation: Not tested  Posture: Not tested    Static Sitting-Level of Assistance: Unable to assess  Dynamic Sitting-Level of Assistance: Unable to assess    Static Standing-Level of Assistance: Unable to assess  Dynamic Standing - Level of Assistance: Unable to assess      Bed Mobility comments: NT - pt unarousable     Transfer comments: UTA      Ambulation  Ambulation comments: UTA     Stairs: UTA            Endurance: poor    Patient at end of session: All needs in reach, In bed, Alarm activated, Lines intact, Notified Nurse            AM-PAC-6 click  Help currently need turning over In bed?: Unable to do/total assistance - Total Dependent Assist  Help currently needed sitting down/standing up from chair with arms? : Unable to do/total assistance - Total Dependent Assist  Help currently needed moving from supine to sitting on edge of bed?: Unable to do/total assistance - Total Dependent Assist  Help currently needed moving to and from bed from wheelchair?: Unable to do/total assistance - Total Dependent  Assist  Help currently needed walking in a hospital room?: Unable to do/total assistance - Total Dependent Assist  Help currently needed climbing 3-5 steps with railing?: Unable to do/total assistance - Total Dependent Assist    Basic Mobility Score 6 click: 6    6 click Score (in points): % of Functional Impairment, Limitation, Restriction  6: 100% impaired, limited, restricted  7-8: At least 80%, but less than 100% impaired, limited restricted  9-13: At least 60%, but less than 80% impaired, limited restricted  14-19: At least 40%, but less than 60% impaired, limited restricted  20-22: At least 20%, but less than 40% impaired, limited restricted  23: At least 1%, but less than 20% impaired, limited restricted  24: 0% impaired, limited restricted    'AM-PAC' forms are Copyright protected by [employer]     Physical Therapy Session Duration  PT Co-Treatment [mins]: 18 (with Delon Faith, OT)  Reason for Co-treatment: Poor activity tolerance    I attest that I have reviewed the above information.  Signed: Almarie Nobles, PT  Filed 04/19/2024          [1]   Past Medical History:  Diagnosis Date    A-fib (CMS-HCC)     Alcoholism    (CMS-HCC)     Alcoholism /alcohol abuse Cirrhosis    (CMS-HCC)     Depression     Hypertension     Liver disease     Malignant neoplasm of overlapping sites of right breast in female, estrogen receptor positive    (CMS-HCC) 10/03/2020   [2]   Past Surgical History:  Procedure Laterality Date    BREAST BIOPSY Right     benign-a long time ago    BREAST BIOPSY Right 07/2020    malignant    BREAST LUMPECTOMY Right     4 2022    CENTRAL LINE  03/25/2024    CHOLECYSTECTOMY      PR BX/REMV,LYMPH NODE,DEEP AXILL Right 09/03/2020    Procedure: BX/EXC LYMPH NODE; OPEN, DEEP AXILRY NODE;  Surgeon: Alm Elsie Como, MD;  Location: [facility];  Service: Surgical Oncology Breast    PR CARDIOVERSION, ELECTIVE;EXTERN N/A 03/10/2024    Procedure: CARDIOVERSION, ELECTIVE, ELECTRICAL CONVERSION OF ARRHYTHMIA; EXTERNAL;  Surgeon: Sedalia Velma Hamilton, MD;  Location: [facility];  Service: Cardiology    PR ECHO HEART,TRANSESOPHAGEAL,COMPLETE Midline 03/10/2024    Procedure: ECHOCARDIOGRAPHY, TRANSESOPHAGEAL, REAL-TIME WITH IMAGE DOCUMENTATION;  Surgeon: Sedalia Velma Hamilton, MD;  Location: [facility] OR [facility];  Service: Cardiology    PR INTRAOPERATIVE SENTINEL LYMPH NODE ID W DYE INJECTION Right 09/03/2020    Procedure: INTRAOPERATIVE IDENTIFICATION SENTINEL LYMPH NODE(S) INCLUDE INJECTION NON-RADIOACTIVE DYE, WHEN PERFORMED;  Surgeon: Alm Elsie Como, MD;  Location: [facility];  Service: Surgical Oncology Breast    PR MASTECTOMY, PARTIAL Right 09/03/2020    Procedure: MASTECTOMY, PARTIAL (EG, LUMPECTOMY, TYLECTOMY, QUADRANTECTOMY, SEGMENTECTOMY);  Surgeon: Alm Elsie Como, MD;  Location: [facility];  Service: Surgical Oncology Breast    PR TRACHEOSTOMY, PLANNED N/A 04/15/2024    Procedure: TRACHEOSTOMY PLANNED (SEPART PROC);  Surgeon: Pixie Loader, MD;  Location: OR [facility];  Service: ENT    PR UPPER GI ENDOSCOPY,BIOPSY N/A 02/03/2018    Procedure: UGI ENDOSCOPY; WITH BIOPSY, SINGLE OR MULTIPLE;  Surgeon: Eleanor Dewey Sorrel, MD;  Location: [facility]ED[facility];  Service: Gastroenterology    RADIATION Right     unsure finish date    TUBAL LIGATION     [  3]   Family History  Problem Relation Age of Onset    Heart disease Mother     No Known Problems Father     No Known Problems Sister     No Known Problems Daughter     No Known Problems Maternal Grandmother     No Known Problems Maternal Grandfather     No Known Problems Paternal Grandmother     No Known Problems Paternal Grandfather     Lupus Brother     No Known Problems Other     BRCA 1/2 Neg Hx     Breast cancer Neg Hx     Cancer Neg Hx     Colon cancer Neg Hx     Endometrial cancer Neg Hx     Ovarian cancer Neg Hx     Mental illness Neg Hx     Substance Abuse Disorder Neg Hx

## 2024-04-19 NOTE — Progress Notes (Signed)
 MICU Daily Progress Note     Date of Service: 04/19/2024    Problem List:   Principal Problem:    Bacteremia, escherichia coli  Active Problems:    Alcoholic liver disease (HHS-HCC)    HTN (hypertension)    Depression with anxiety    Class 2 severe obesity with serious comorbidity in adult    Atrial fibrillation    (CMS-HCC)    Pleural effusion    Hypernatremia    Thrombocytopenia    Cirrhosis    (CMS-HCC)    A-fib (CMS-HCC)    Hepatic encephalopathy    (CMS-HCC)    Anaphylaxis      Interval history: [name] is a 78 y.o. female with HFpEF, HTN, Afib on AC, MASLD cirrhosis, originally hospitalized for scheduled TEE/DCCV (aborted d/t LAA clot) c/b AHHRF, encephalopathy and septic shock requiring MICU care, was s/p extubation transferred to [facility] for further management of anticoagulation and hypernatremia. Transferred back to [facility] MICU for closer monitoring for respiratory status, pressor requirement, GI bleed, and management of carotid artery injury.     On 10/24, patient had acute worsening of respiratory status leading requiring intubation. When patient was given sedation for intubation, she coded and promptly had ROSC after a few minutes of CPR. Subsequently, CVC placed in neck, used to draw blood, unclear if given pressors through it, then found  to be in arterial system based on blood gas and imaging, likely right carotid. Line was subsequently removed, and pressure was held for 15 minutes, hemostasis with no hematoma. She was transferred to [facility] MICU for management of this carotid injury, to be evaluated by vascular surgery while also managing her increased vasopressor requirement and respiratory distress.  Has been weaned off pressors since 10/27 at 1 PM. VBG's indicating that she is ready for extubation, however her mentation suggests that she is not able to protect her [name].  She was extubated on 10/31, and mentation slowly improved.  Unfortunately on the morning of 11/2 she had increased lethargy, increased secretions, and she was not protecting her [name].  She was reintubated on 11/2, and required pressor support with norepinephrine and vasopressin after the induction agents.  Self extubated on 11/5. Re intubated on 11/6 due to hypercarbia and increased work of breathing.  We are left with the challenging options regarding the care of [name].  Due to her multiple failed extubations, we are concerned [name] will need a tracheostomy long-term to protect her [name].  There is a meeting with palliative care and family on 04/13/2024, her family agreed for tracheostomy.  Tracheostomy placed 11/14.  On 11/16, patient started presenting with altered mental status as she became hard to arouse and stopped following commands.  Sepsis workup was initiated.  She also had a lateral gaze on exam, so neurology was consulted.  EEG showed seizure activity and MRI brain showed diffuse cortical restricted diffusion greatest within the bilateral parietal -occipital regions.  Her ammonia level is also elevated to 291.  It is unclear if seizures are related to her ammonia level.  Patient placed on Keppra and Vimpat.        Neurological   Hepatic encephalopathy - seizure-like activity  Per chart review at [facility], rapid response called on 10/24 for increased somnolence. At the time, differential included hepatic encephalopathy, toxic metabolic encephalopathy in the setting of shock (possibly sepsis), versus intracranial pathology. She was subsequently intubated. CT head on 10/24 showed no acute intracranial abnormality. Weaned off sedation with slow  improvements in neurological status.  While we have continued lactulose  therapy and seeing good response with several bowel movements, her mentation remains poor.  This is concerning for severe ICU delirium.  Meeting was had with daughter, [name], on 11/5.  Described that should intubation be necessary we would likely be moving towards a tracheostomy as [name].  As of now [name] wanted to pursue aggressive measures including repeat intubation and potential tracheostomy. Tracheostomy placed 11/14.  Overnight on 04/18/2024, patient noted to have eye deviation along with decreased responsiveness on physical exam.  Head CT ordered and reassuring.  Neurology consulted.  EEG with evidence of seizure activity, therefore Keppra started per neurology.  Titrating lactulose  for goal stool output.  - Neurology following, appreciate recommendations  - Increase lactulose  to 30 g every 2 hours, holding parameters: >2L output   - Rifaximin  550 mg twice daily  - MRI brain shows diffuse cortical restricted diffusion greatest within the bilateral parieto-occipital regions, unclear etiology  -Continue Keppra 1 g twice daily  - Continue Vimpat 100 mg twice daily  - Neurology wanted to get repeat EKG to assess for PR interval, if below 200 wanted to start Vimpat 200 mg twice daily, however patient is in A-fib so we cannot assess PR interval.  For now, we will stick with 100 mg twice daily.      Analgesia: No pain issues  RASS at goal? N/A, not on sedation  [place] Agitation Assessment Scale (RASS) : -3 (04/19/2024  4:00 PM)    Pulmonary   Acute Hypoxic Hypercarbic Respiratory Failure   Rapid response called on 10/24 for increased somnolence, found to have acute hypercarbic respiratory failure and subsequently intubated. Suspect hypercarbic respiratory failure due to encephalopathy as above. CXR on 10/24 showed worsening pleural effusions. Not currently getting diuresed. Given patient's hypoxia, and AMS, there was concern for infectious etiology. MRSA nares negative. Infectious workup thus far is unremarkable. SBTs show readiness extubation based on VBGs, however there is concern that she continues to be unable to protect her [name] due to her current mentation.  Reintubated on 11/2 followed by therapeutic bronchoscopy.  Self extubated on 11/5. Reintubate noted on 11/6 due to hypercarbia and not protecting her [name]. Trach placed 11/14, complicated by anaphylactic reaction to rocuronium which was treated with epi, benadryl and steroids. Allergy added to chart.  - Continue mechanical ventilation, weaning as tolerated  - ENT following, appreciate recommendations  - Plan for first trach exchange with ENT on POD 5  -Tolerating pressure support  -continue vent settings    Vent Mode: PSV-CPAP  FiO2 (%): 30 %  S RR: 15  S VT: 320 mL  PEEP: 5 cm H20  PR SUP: 12 cm H20 set      Cardiovascular   Carotid Artery Injury (resolved)  At [facility], CVC placed intended for RIJ on 10/24 following intubation, PEA, ROSC. Was used with known blood return. Unclear if it was used for pressor support, but highly likely that it was. CXR for placement check showed concern for intra aterial location, and blood gas confirmed it was arterial. Thought to be in carotid artery. CVC removed with pressure held for 15 minutes, with no hematoma formation. On arrival, no physical exam and bedside ultrasound showed no large hematoma. No active bleeding.   - Vascular Surgery consulted, stated fistula has healed based on Ultrasound findings    PEA arrest s/p ROSC  Hypovolemic/Hemorrhagic shock (resolved)  Atrial fibrillation  known left atrial  appendage clot  HFpEF  Patient with PEA arrest peri-intubation with ROSC. Shock thought to be hemorrhagic in the setting of GI bleed History of atrial fibrillation with known LAA clot and HFpEF. Vasopressors were weaned off 10/27. APTT was elevated the morning of 10/28 necessitating changing anticoagulation from therapeutic heparin to therapeutic Lovenox .   - Metoprolol initially held given that patient was placed on nor epi, however has not needed pressors today  -Will restart home metoprolol given that patient has been tachycardic  - Heparin gtt        Renal   Abnormal electrolytes (hypernatremia)  At [facility], had persistently hypernatremia near 153. Had been treated with D5 gtt. Sodium throughout admission has largely been 146-148. Sodium has overall normalized and continues to be within normal limits.  - Strict I/O's  - BMP daily  - Potassium repleted overnight    Infectious Disease/Autoimmune   E. Coli Bacteremia (resolved),   Initially admitted for afib, found to have E. coli bacteremia on October 10. This was treated with cefazolin . Rapid response called 10/24, patient noted to be hypothermic. WBCs jumped from 3.0 to 9.7 on 10/24. Although the most recent lab was following intubation and CPR. UA with pyuria, rare bacteria, hematuria. Peri-intubation, patient developed significant hypotension requiring initiation of NE and vasopressin. Suspect possible septic shock with unknown infectious source. Patient started on vancomycin and cefepime 10/24. CT Abdomen pelvis concerning for aspiration pneumonia. Following infectious workup was unremarkable. Antibiotics were stopped on 10/27.  Infectious workup was repeated on 11/3 due to reintubation and status post bronchoscopy.  Completed course of ceftriaxone.  -CTM    Cultures:  Blood Culture, Routine (no units)   Date Value   04/17/2024 No Growth at 48 hours   04/17/2024 No Growth at 48 hours     Urine Culture, Comprehensive (no units)   Date Value   04/17/2024 NO GROWTH   04/09/2024 NO GROWTH     Lower Respiratory Culture (no units)   Date Value   04/10/2024 2+ Methicillin-Susceptible Staphylococcus aureus (A)   04/10/2024 1+ Oropharyngeal Flora Isolated     WBC (10*9/L)   Date Value   04/19/2024 4.4     WBC, UA (/HPF)   Date Value   04/17/2024 1       FEN/GI   Sigmoid Mass c/f Malignancy and Hematochezia (resolved)  Hemorrhagic Shock (resolved)  CT abdomen pelvis done 10/24 with evidence of cirrhosis and masslike thickening of the lower sigmoid colon concerning for malignancy.  CTA abdomen pelvis performed 1024 with active extravasation into the gastric fundus possibly secondary to traumatic enteric tube placement.  GI scoped the patient on 10/25 and clipped an area of active bleeding.  They removed a total of 1.5 L of blood.  Hemoglobin 10/20 6 AM downtrended to 6.0, so 2 units of blood transfused.  GI stated this was consistent with 10/25 scope and likely did not represent increased bleeding. Hb steadily improving with Hb of 9.2 on 10/28.   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 Given shock of unclear etiology. CTAP demonstrating cirrhosis. Likely that cirrhosis is contributing to hypotension.   - Continue with lactulose  30 g every 2 hours, hold parameters: Greater than 2 L  - Goal stool output 1500 mL daily  - NGT in place;  TF and FWF  - Daily MELD labs and CMP     MELD 3.0: 15 at 04/05/2024  4:31 AM  MELD-Na: 13 at 04/05/2024  4:31 AM  Calculated from:  Serum Creatinine: 0.43 mg/dL (Using min of 1 mg/dL) at 88/09/7972  5:68 AM  Serum Sodium: 143 mmol/L (Using max of 137 mmol/L) at 04/05/2024  4:31 AM  Total Bilirubin: 1.3 mg/dL at 88/09/7972  5:68 AM  Serum Albumin: 2.4 g/dL at 88/09/7972  5:68 AM  INR(ratio): 1.61 at 04/03/2024 10:26 AM  Age at listing (hypothetical): 78 years  Sex: Female at 04/05/2024  4:31 AM    Provider Malnutrition Assessment:  Body mass index is 40.99 kg/m??. BMI Interpretation: >/= 30 and < 40, consistent with obesity, clinically significant requiring additional resources and complicating multiple aspects of patient care.  GLIM criteria:   Pt does not meet criteria  -I have screened this patient for malnutrition and they did NOT meet criteria for malnutrition based on GLIM criteria.  -TF and FWF; Dietician consulted, appreciate assistance    Heme/Coag   Acute blood loss anemia  Hemoglobin down trended to 6.0 on the morning of 10/26 thought to be secondary to acute GI bleed that was scoped and clipped 10/25.  H/H stable.  -CBC daily  - Maintain active T&S    Endocrine   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)   - home anastrozole     Integumentary   NAI   #  - WOCN consulted for high risk skin assessment No. Reason: Not indicated.    Prophylaxis/LDA/Restraints/Consults   ICU Checklist completed: yes (see ICU rounding navigator in Epic)    Patient Lines/Drains/Airways Status       Active Active Lines, Drains, & Airways       Name Placement date Placement time Site Days    Tracheostomy Shiley 6 Cuffed 04/15/24  1133  6  4    NG/OG Tube Feedings 10 Fr. Right nostril 04/01/24  1138  Right nostril  18    External Urinary Device 04/13/24 With Suction 04/13/24  1700  -- 6    PICC Double Lumen 04/06/24 Left Basilic 04/06/24  1558  Basilic  13                  Patient Lines/Drains/Airways Status       Active Wounds       Name Placement date Placement time Site Days    Wound 03/01/24 Other (Comment) Toe (Comment which one) Left;Other (Comment) Blister present on arrival, tx already in outpatient setting, in process of healing, surrounding erythema 03/01/24  0129  Toe (Comment which one)  49    Wound 03/13/24 Irritant Contact Dermatitis Incontinence Sacrum Mid gluteal cleft MASD? 03/13/24  --  Sacrum  37    Wound 04/15/24 Surgical Neck tracheostomy secured with sutures and trach collar ( surgicel applied 04/15/24  1144  Neck  4                  Goals of Care     Code Status:   Orders Placed This Encounter   Procedures    Full Code     Standing Status:   Standing     Number of Occurrences:   1        Designated Healthcare Decision Maker:  Ms. Rhymes designated healthcare decision maker(s) is/are   HCDM (patient stated preference): Ghanem,[name] - Spouse - [phone]    HCDM (  patient stated preference): Sobalvarro,[name] - Daughter - [phone]. See HCDM section of Epic sidebar/storyboard or ACP tab in patient chart for details regarding active HCDMs and patient capacity for decision-making.      Subjective     Patient intubated. She opens her eyes to stimulation but does not follow commands.    Objective     Vitals - past 24 hours  Temp:  [36.9 ??C (98.5 ??F)-37.7 ??C (99.8 ??F)] 37.5 ??C (99.5 ??F)  Pulse:  [91-128] 128  SpO2 Pulse:  [92-131] 125  Resp:  [14-36] 32  BP: (100-180)/(32-88) 180/80  FiO2 (%):  [30 %] 30 %  SpO2:  [94 %-100 %] 99 % Intake/Output  I/O last 3 completed shifts:  In: 4274 [P.O.:120; I.V.:469; WH/HU:6514; IV Piggyback:200]  Out: 2400 [Urine:600; Stool:1800]     Physical Exam:    General: intubated on my examination. Trach in place. not following commands  HEENT: normocephalic, atraumatic   CV: pulses palpable, regular borderline tachycardia   Pulm: Rhonchi w transmitted upper [name] sounds throughout  GI: soft, NTND, + BS  MSK: Bilateral LE pitting edema  Skin: numerous large flat violaceous ecchymosis on left arm, chest, neck bilaterally.   Neuro: Withdrawals to painful stimuli.  Not following commands.  Reflexes intact    Continuous Infusions:   Infusions Meds[1]    Scheduled Medications:   Scheduled Medications[2]    PRN medications:  PRN Medications[3]    Data/Imaging Review: Reviewed in Epic and personally interpreted on 04/19/2024. See EMR for detailed results.    Waddell Mead, DO  PGY-1 Emergency Medicine           [1]    heparin 11 Units/kg/hr (04/19/24 0232)    NORepinephrine bitartrate-NS Stopped (04/19/24 0050)   [2]    cyanocobalamin (vitamin B-12)  1,000 mcg Enteral tube: gastric Daily    esomeprazole  40 mg Enteral tube: gastric Daily    famotidine  20 mg Enteral tube: gastric BID    flu vac 2025 [id number](PF)  0.5 mL Intramuscular During hospitalization    folic acid   1 mg Enteral tube: gastric Daily    lacosamide  100 mg Intravenous BID    lactulose   30 g Enteral tube: gastric Q2H    levETIRAcetam  1,000 mg Intravenous Q12H SCH    melatonin  3 mg Enteral tube: gastric QPM    metoPROLOL tartrate  25 mg Enteral tube: gastric BID multivitamins (ADULT)  1 tablet Enteral tube: gastric Daily    rifAXIMin   550 mg Enteral tube: gastric BID    sodium chloride   10 mL Intravenous Q8H    sodium chloride   10 mL Intravenous Q8H    thiamine  mononitrate (vit B1)  100 mg Enteral tube: gastric Daily   [3] acetaminophen , heparin (porcine)

## 2024-04-19 NOTE — Consults (Signed)
 OCCUPATIONAL THERAPY  Re-Evaluation (04/19/24 1015)    Patient Name:  [name]       Medical Record Number: [id number]     Date of Birth: 1945/12/12  Sex: Female      Post-Discharge Occupational Therapy Recommendations: To be determined          Equipment Recommendation  OT DME Recommendations: Defer to post acute       OT Treatment Diagnosis: Limitation of activities due to disability, Need for assistance with personal care, Unsteadiness on feet, Generalized muscle weakness, Reduced mobility         Assessment  Assessment: Pt recently had a trach placed and remains on the ventilator. Pt w/ recent seizure like activity and mostly unresponsive. This is a significant functional decline from when she was intubated. OT will adjust Pt's POC and Goals to reflect her current status and promote the just right challenge for her at this time. Will continue to follow    Problem List: Decreased range of motion, Decreased coordination, Decreased activity tolerance, Decreased strength, Impaired balance, Impaired fine motor skills, Decreased skin integrity, Pain, Increased edema, Decreased endurance, Impaired ADLs, Fall risk, Decreased mobility             Skilled Interventions Performed: Balance activities, Bed mobility, Education - Patient, Functional mobility, Endurance activities, Postular / Proximal stability, Range of motion, Positioning, UE Strength / coordination exercise       Activity Tolerance During Today's Session  Limited by Mental Status, Limited secondary to medical complications    Plan  Planned Frequency of Treatment: Plan of Care Initiated: 04/19/24  1-2x per day Weekly Frequency: 2-3 days per week  Planned Treatment Duration: 05/03/24    Planned Interventions:  Clinical Research Associate, Education (Patient/Family/Caregiver), Self-Care/Home Training, Therapeutic Exercise, Therapeutic Activity, Neuromuscular Re-education      GOALS:   Patient and Family Goals: None stated this date    Short Term: SHORT GOAL #1: Pt will complete EOB/OOB assessment   Time Frame : 2 weeks  SHORT GOAL #2: Pt will follow ~50% of commands   Time Frame : 2 weeks  SHORT GOAL #3: x   Time Frame : 2 weeks           Long Term Goal #1: Pt will score 18+/24 on AMPAC  Time Frame: 4 weeks    Prognosis:  Good  Positive Indicators:  PLOF  Barriers to Discharge: Inaccessible home environment, Decreased safety awareness, Endurance deficits, Cognitive deficits, Inability to safely perform ADLS, Functional strength deficits, Decreased range of motion, Severity of deficits    Subjective  Medical Updates Since Last Visit/Relevant PMH Affecting Clinical Decision Making: Trach and Vent  Prior Functional Status      Living Situation  Living Environment: House  Lives With: Spouse, Daughter, Family  Home Living: Multi-level home, Walk-in shower, Raised toilet seat without rails, Stairs to enter with rails, Stairs to alternate level with rails, Built-in shower seat  Rail placement (outside): Bilateral rails in reach  Number of Stairs to Enter (outside): 9  Rail placement (inside): Bilateral rails in reach  Number of Stairs to Alternate level (inside):  (needs clarification)     Equipment available at home: Rollator, Rolling walker            Patient / Caregiver reports: Unable to participate      Past Medical History[1] Social History     Tobacco Use    Smoking status: Never     Passive exposure: Past  Smokeless tobacco: Never   Substance Use Topics    Alcohol use: Not Currently      Past Surgical History[2] Family History[3]     Rocuronium, Sulfa (sulfonamide antibiotics), Sulfur , and Aspirin     Objective Findings  Precautions / Restrictions  Falls precautions, Delirium Precautions, Aspiration precautions      Required Braces or Orthoses  Non-applicable      Communication Preference  Verbal (Pt nonverbal at time of eval)       Pain  Pt unable to report pain; No s/s of pain    Equipment / Environment  Arterial line, Foley, NGT, Restraints, Rectal bag, Telemetry, Vascular access (PIV, TLC, Port-a-cath, PICC), Ventilatory support, Tracheostomy, EEG/VEEG    Cognition   Orientation Level:  Unable to test   Arousal/Alertness:  Unable to assess   Attention Span:  Unable to assess   Memory:  Unable to assess   Following Commands:  Not following commands   Safety Judgment:  Decreased awareness of need for safety   Awareness of Errors and Problem Solving:  Unable to assess    Vision / Hearing      Vision Comments: Pt opened eyes 1x but unable localize or trach      Hearing: UTA     Hand Function:  Right Hand Function: Right hand function impaired  Right Hand Impairment: ROM poor  Left Hand Function: Left hand function impaired  Left Hand Impairment: ROM poor  Hand Function comments: full PROM    Skin Inspection:  Skin Inspection: Redness, Bruising, Swelling, Skin tear  Skin Inspection comment: Weeping      ROM / Strength:  UE ROM/Strength: Left Impaired/Limited, Right Impaired/Limited  RUE Impairment: Reduced strength, Limited AROM  LUE Impairment: Reduced strength, Limited AROM  UE ROM/ Strength Comment: Full PROM  LE ROM/Strength: Left Impaired/Limited, Right Impaired/Limited  RLE Impairment: Reduced strength, Limited AROM  LLE Impairment: Reduced strength, Limited AROM  LE ROM/ Strength Comment: Limited PROM 2/2 swelling    Sensation:  RUE Sensation: RUE intact  LUE Sensation: LUE intact  RLE Sensation: RLE intact  LLE Sensation: LLE intact  Sensory/ Proprioception/ Stereognosis comments: No response to noxious stimuli    Balance:  Static Sitting-Level of Assistance: Unable to assess  Dynamic Sitting-Level of Assistance: Unable to assess  Sitting Balance comments: Pt positioned in partial bed in chair position.VSS    Static Standing-Level of Assistance: Unable to assess  Dynamic Standing - Level of Assistance: Unable to assess    Functional Mobility  Transfers:  (NT)  Bed Mobility : Total assist, Physical assistance required, +2 assist    Ambulation  Level of Assistance:  (NT')    ADLs  Feeding : Total Assist  Grooming: Total Assist  Bathing: Total Assist  Toileting: Total Assist  UB Dressing: Total Assist  LB Dressing: Total Assist    IADLs: NT    Vitals / Orthostatics  Vitals/Orthostatics: VSS and monitored    Patient at end of session: All needs in reach, In bed, Lines intact, Notified Nurse     Occupational Therapy Session Duration  OT Co-Treatment [mins]: 20 (w/ EL)  Reason for Co-treatment: Poor activity tolerance           AM-PAC Daily Activity Assessment  Lower Body Dressing assistance needs: Unable to do/total assistance - Total Dependent Assist  Bathing assistance needs: Unable to do/total assistance - Total Dependent Assist  Toileting assistance needs: Unable to do/total assistance - Total Dependent Assist  Upper Body Dressing assistance needs:  Unable to do/total assistance - Total Dependent Assist  Personal Grooming assistance needs: Unable to do/total assistance - Total Dependent Assist  Eating Meals assistance needs: Unable to do/total assistance - Total Dependent Assist      Daily Activity Score: 6    Score (in points): % of Functional Impairment, Limitation, Restriction  5: 100% impaired, limited, restricted  6-7: At least 80%, but less than 100% impaired, limited restricted  8-11: At least 60%, but less than 80% impaired, limited restricted  12-16: At least 40%, but less than 60% impaired, limited restricted  17-18: At least 20%, but less than 40% impaired, limited restricted  19: At least 1%, but less than 20% impaired, limited restricted  20: 0% impaired, limited restricted    'AM-PAC' forms are Copyright protected by [employer] [facility]       I attest that I have reviewed the above information.  Signed: Delon Faith, OT  Filed 04/19/2024                 [1]   Past Medical History:  Diagnosis Date    A-fib (CMS-HCC)     Alcoholism    (CMS-HCC)     Alcoholism /alcohol abuse     Cirrhosis    (CMS-HCC)     Depression     Hypertension Liver disease     Malignant neoplasm of overlapping sites of right breast in female, estrogen receptor positive    (CMS-HCC) 10/03/2020   [2]   Past Surgical History:  Procedure Laterality Date    BREAST BIOPSY Right     benign-a long time ago    BREAST BIOPSY Right 07/2020    malignant    BREAST LUMPECTOMY Right     4 2022    CENTRAL LINE  03/25/2024    CHOLECYSTECTOMY      PR BX/REMV,LYMPH NODE,DEEP AXILL Right 09/03/2020    Procedure: BX/EXC LYMPH NODE; OPEN, DEEP AXILRY NODE;  Surgeon: Alm Elsie Como, MD;  [name];  Service: Surgical Oncology Breast    PR CARDIOVERSION, ELECTIVE;EXTERN N/A 03/10/2024    Procedure: CARDIOVERSION, ELECTIVE, ELECTRICAL CONVERSION OF ARRHYTHMIA; EXTERNAL;  Surgeon: Sedalia Velma Hamilton, MD;  [facility];  Service: Cardiology    PR ECHO HEART,TRANSESOPHAGEAL,COMPLETE Midline 03/10/2024    Procedure: ECHOCARDIOGRAPHY, TRANSESOPHAGEAL, REAL-TIME WITH IMAGE DOCUMENTATION;  Surgeon: Sedalia Velma Hamilton, MD;  Location: [facility] OR [facility];  Service: Cardiology    PR INTRAOPERATIVE SENTINEL LYMPH NODE ID W DYE INJECTION Right 09/03/2020    Procedure: INTRAOPERATIVE IDENTIFICATION SENTINEL LYMPH NODE(S) INCLUDE INJECTION NON-RADIOACTIVE DYE, WHEN PERFORMED;  Surgeon: Alm Elsie Como, MD;  Location: [facility];  Service: Surgical Oncology Breast    PR MASTECTOMY, PARTIAL Right 09/03/2020    Procedure: MASTECTOMY, PARTIAL (EG, LUMPECTOMY, TYLECTOMY, QUADRANTECTOMY, SEGMENTECTOMY);  Surgeon: Alm Elsie Como, MD;  Location: [facility];  Service: Surgical Oncology Breast    PR TRACHEOSTOMY, PLANNED N/A 04/15/2024    Procedure: TRACHEOSTOMY PLANNED (SEPART PROC);  Surgeon: Pixie Loader, MD;  Location: OR [facility];  Service: ENT    PR UPPER GI ENDOSCOPY,BIOPSY N/A 02/03/2018    Procedure: UGI ENDOSCOPY; WITH BIOPSY, SINGLE OR MULTIPLE;  Surgeon: Eleanor Dewey Sorrel, MD;  Location: [facility] PROCEDURES [facility];  Service: Gastroenterology    RADIATION Right     unsure finish date    TUBAL LIGATION     [3]   Family History  Problem Relation Age of Onset    Heart disease Mother  No Known Problems Father     No Known Problems Sister     No Known Problems Daughter     No Known Problems Maternal Grandmother     No Known Problems Maternal Grandfather     No Known Problems Paternal Grandmother     No Known Problems Paternal Grandfather     Lupus Brother     No Known Problems Other     BRCA 1/2 Neg Hx     Breast cancer Neg Hx     Cancer Neg Hx     Colon cancer Neg Hx     Endometrial cancer Neg Hx     Ovarian cancer Neg Hx     Mental illness Neg Hx     Substance Abuse Disorder Neg Hx

## 2024-04-19 NOTE — Plan of Care (Signed)
 Patient rested overnight on documented vent settings. Airway is patent and secure. Trach care completed with emergency equipment at bedside. Will continue to monitor.    Problem: Mechanical Ventilation Invasive  Goal: Optimal Device Function  Outcome: Ongoing - Unchanged     Problem: Artificial Airway  Goal: Optimal Device Function  Outcome: Ongoing - Unchanged

## 2024-04-19 NOTE — Procedures (Signed)
 DAILY PRELIMINARY INTERIM LONGTERM VIDEO EEG MONITORING NOTE    Identifying Information   NAME: [name]    MRN: [id number]   DOB: 1946/04/04    Ordering Provider:  Velma Hamilton Deen   LOC: 4326/4326-01    Study Information  EEG Start: 04/18/24 at 1700  EEG End: 11/**/2025 at TBD        HISTORY: 78 y.o. female with history of HFpEF, afib, and MASLD cirrhosis admitted with respiratory failure, hepatic encephalopathy, and septic shock course with course c/b hypernatremia. Consult for c/f seizure.   MRI brain 11/16. -Diffuse cortical restricted diffusion greatest within the bilateral parieto-occipital regions.     INDICATION:  Evaluate for Electrographic Seizures and Seizures     PATIENT STATE: Somnolent    EEG TECHNICAL DESCRIPTION   Conditions of Recording:  Continuous EEG with simultaneous video recording was performed utilizing 21 active electrodes placed according to the international 10-20 system.  The study was recorded digitally with a bandpass of 1-70Hz  and a sampling rate of 200Hz  and was reviewed with the possibility of multiple reformatting.  The study was digitally processed with potential spike and seizure events identified for physician analysis and review.  Patient recognized events were identified by a push button marker and reviewed by the physician. Simultaneous video was reviewed for all patient events.    DAY 1 EEG DESCRIPTION: 11/17725  at 1700 to 04/19/2024 at 0430 Winfield Nora, MD)   Disconnected for MRI   Relevant Medications: levetiracetam (Keppra) 750 mg BID  Loaded with Keppra @1620   Versed  give @2009     Background:  Background is continuous initially without any clear PDR  There was a loss of background organization. Instead, the background was characterized by normal (>20 to <150 uV) diffuse 2-5 hz theta delta slowing. There was reactivity to stimulation.    There was not transition to sleep  .  From 6 pm onwards the background became more discontinuous with intermittent periodic discharge -->burst attenuation >suppression lasting from 2-10 seconds.  The burst were posterior dominant high amplitude sharply contoured wave with faster activity lasting for 0.5 -1 second with inter burst interval varying from 1 -5 seconds.  Though towards the end of the study the discharges were at times like generalized periodic discharges        Interictal Epileptiform Activity:  There were bilaterally independent/generalized continuous epileptiform periodic patterns noted in posterior quadrant at 1 Hz.The discharge were complex of moderate to high amplitude sharply contoured waves with faster activity [LPD +F]    Ictal Activity:  The  interval between periodic waves occasionally  became continuous and rhythmic with sharply contoured theta waves with superadded fast activity  with spread to anterior frontocentral chains and can occur independently on left and right side.  At times the pattern lasted less than <10 seconds suggestive of BRIEF POTENTIALLY ICTAL RHYTHMIC DISCHARGES (BIRDs)  After versed  at 2009 the background remained discontinuous,though ictal evolution was seen till 1:00 am  Electrographic seizures #1@1 :06am  The burst become continuous evolve in frequency to 2-3 Hz with intermixed faster frequency and lasts for  10 12 seconds and is better evolved on left side  No clinical features- provider was bedside  There were at least 6 electrographic seizures from left posterior quadrant from 4:00-4:30 am from left posterior quadrant without any clinical correlate       DAY 2 EEG DESCRIPTION: 11/18725  at 04:30 to 04/20/2024 at 0430 Winfield Nora, MD)     Relevant Medications: levetiracetam (Keppra)  750 mg BID  Loaded with Keppra  @1620   Versed  give @2009   Vimpat     Background:  Background remains disorganized with no clear PDR.It comprises of posterior dominant high amplitude sharp waves complex with superadded fast  1 hz.[Generalized periodic discharges] The interdischarge interval of 1-2 sec consists of low moderate amplitude delta waves  No sleep transients    Ictal pattern  BIRDS -multiple--> reduced as the study progressed  Left posterior quadrant electrographic seizure  Last of the ictal pattern seen at 1007    EEG SUMMARY INTERPRETATION:   This is an abnormal EEG due to:  1.Posterior quadrant seizure, on left side in later half of the study which after 4 am became abundant Lst sz at 4:27 on 11/18  2 Brief potentially ictal rhythmic discharges (BIRDs) in left and right posterior quadrant[Last at 1007 on 11/18]  3.Continuous bilaterally independent/generalized periodic discharges at           CLINICAL CORRELATION / SUMMARY of RECORDING:   These findings are consistent with   Posterior quadrant seizures  a non-specific severe degree of encephalopathy.     Focal epileptiform potentials can be seen in patients with partial-onset seizures, but do not in themselves confirm a diagnosis of seizures or epilepsy., Lateralized periodic discharges (LPDs) can be associated with HSV encephalitis, infarction, focal seizures, and other focal or lateralized cerebral pathologies. At the very least, they indicate an elevated risk of focal onset seizures. , and Burst-suppression pattern is associated with severe encephalopathy (diffuse cerebral dysfunction) that can be caused by anoxia, sedating or ansethetic medication, or hypothermia.   Interpreting Provider: Maryfrances Nora, MD    2HELPS2B Seizure Risk Score  Clinical risk score based on EEG findings and clinical history of seizures to aid in determination of optimal duration of EEG monitoring for detection of electrographic seizures.  Score has not been validated in patients under age 69 or following cardiac arrest.    Lateralized / Bilateral Independent Periodic Discharges or Lateralized Rhythmic Delta Activity - 1 point  and Brief Ictal Appearing Rhythmic Discharges (BIRDs) - 2 points    Add 1 point if known history of epilepsy or prior clinical seizure.      Risk Group Seizure Risk   at 72 hours   Duration of Monitoring    Seizure risk < 5%     Duration of Monitoring    Seizure risk < 2%       Low Risk,   Score = 0     3.1%   1 Hour   3.3 Hours     Medium Risk,   Score = 1     12%   12 Hours   29 Hours     High Risk,   Score = 2 or greater                >25%   >24 Hours   >30 Hours   Struck et al JAMA Neurology, January 2020    Not appropriate for EEG monitoring being performed for the following:  Treatment of status epilepticus or seizures already documented on EEG  Monitoring sedation/ burst suppression for management of intracranial pressure and/or paralyzed patients  Diagnostic evaluation of transient episodes concerning for possible seizures (spell capture)   Patients s/p cardiac arrest undergoing targeted temperature management      Amon dunker al. American Clinical Neurophysiology Society's Standardized Critical Care EEG Terminology: 2021 Version. Journal of Clinical Neurophysiology 38(1):p 1-29, January 2021.

## 2024-04-19 NOTE — Progress Notes (Signed)
 OTOLARYNGOLOGY HEAD & NECK SURGERY- PROGRESS NOTE    Assessment/Plan:  78 year old F with history of HFpEF, HTN, Afib on AC, MASLD cirrhosis, originally hospitalized for scheduled TEE/DCCV complicated by chronic respiratory failure requiring prolonged intubation. Now s/p tracheostomy 11/14. 4 Days Post-Op     - Surgicel removed 11/16  - Keep obturator at head of bed. Please keep extra 6 cuffed, 4 cuffed, 6 cuffless, and 4 cuffless tracheostomy tubes at bedside.  - Suction trach as ordered.  - Please keep mepilex under inferior flange of trach to prevent wound breakdown from pressure injury.   - Trach secured with 4 quadrant sutures and soft collar. Please do not cut trach ties or sutures.  - ENT to perform first trach change. Anticipate POD 5-7 or after weaned off ventilator.   - Wean from ventilator per primary team. Please involve the trach team and respiratory therapy in tracheostomy care.   - Please teach patient and family trach care when appropriate.  - ENT will continue to follow. Please page with questions or concerns.       Principal Problem:    Bacteremia, escherichia coli  Active Problems:    Alcoholic liver disease (HHS-HCC)    HTN (hypertension)    Depression with anxiety    Class 2 severe obesity with serious comorbidity in adult    Atrial fibrillation    (CMS-HCC)    Pleural effusion    Hypernatremia    Thrombocytopenia    Cirrhosis    (CMS-HCC)    A-fib (CMS-HCC)    Hepatic encephalopathy    (CMS-HCC)    Anaphylaxis       LOS: 40 days       Subjective:  NAEO, AF, HDS on vent. Currently on 30%/5L. No desats.  No bleeding from the trach or stoma.     Objective:     Vital signs in last 24 hours:  Temp:  [36.9 ??C (98.5 ??F)-37.8 ??C (100 ??F)] 37.2 ??C (98.9 ??F)  Pulse:  [91-127] 121  SpO2 Pulse:  [73-131] 116  Resp:  [14-34] 21  BP: (99-172)/(32-87) 143/63  MAP (mmHg):  [54-112] 91  FiO2 (%):  [30 %] 30 %  SpO2:  [94 %-100 %] 99 %    Intake/Output last 24 hours:  I/O last 3 completed shifts:  In: 3963.1 [P.O.:120; I.V.:193.1; NG/GT:3450; IV Piggyback:200]  Out: 2400 [Urine:600; Stool:1800]    Physical Exam  General: Sedated, laying supine, chronically ill-appearing, morbidly obese  Pulm: Mechanically ventilated   Neck: 6 cuffed Shiley tracheostomy tube in place, secured with 4 quadrant silk sutures and soft collar.

## 2024-04-19 NOTE — Consults (Addendum)
 Follow-Up Consult Note        Requesting Attending Physician:  Morene ONEIDA Dec, [name]  Service Requesting Consult: Medical ICU (MDI)     Assessment and Plan     [name] is a 78 y.o. female pmhx HFpEF, afib, and MASLD cirrhosis admitted with respiratory failure, hepatic encephalopathy, and septic shock course with course c/b hypernatremia on whom I have been asked by Morene ONEIDA Dec, [name] to consult for c/f seizure.    # Roving eye movements:  # Metabolic encephalopathy   Neurology team consulted for evaluation of possible nonrhythmic roving eye movements lasting several hours in duration, in context of progressively declining mental status over 3-4 days (previously intermittently following commands). Roving eye movements are a sign of diffuse cortical dysfunction with preserved brainstem function, most often seen in metabolic encephalopathy. Similarly presenting symptoms include ping-pong gaze (often associated with metabolic encephalopathy, diffuse cortical dysfunction, or toxic ingestion), and periodic alternating gaze deviation (seen in metabolic encephalopathies. This is consistent with patients long complicated course- initially admitted to OSH with DCCV/TEE which was aborted d/t LAA thrombus, multiple episodes of hypercarbic respiratory failure eventually requiring tracheostomy, PEA arrest 10/24 w/ ROSC after min, shock likely 2/2 GIB, E.coli bacteremia, and multiple episodes of hyperammonemia (31 on 9/30, 251 10/10--> 82 10/13-> 160 10/24-> 291 11/16). While patient does have seizures as noted below, these are likely separate from eye movements.    # Diffuse Cortical Restriction  # Non-convulsive Seizures  MRI Brain w/wo Contrast and cvEEG were pursued for further evaluation of the above. MRI demonstrated diffuse cortical restriction of the bilateral parieto-occipital regions, as may be seen in prolonged seizure, PRES, hypoglycemic hypoxic ischemic encephalopathy, acute hyperammonemic encephalopathy. Clinical course not consistent with CJD. These findings are new since 11/2 MRI brain. Imaging discussed at neuroradiology case conference - wide diffuse pattern is overall most suggestive of severe toxic-metabolic etiology- etiology is likely multifactorial including hyperammonemia and/or PRES (labile BP, severe hepatic encephalopathy, renal injury). Pattern does not localize to vascular territory and patient is currently afebrile with no leukocytosis so decreased concern for meningitis/encephalitis.  Patient did have repeated episodes of AMS since prior MRI 11/2- given seizures are subclinical it is possible she was having seizures previously. Seizure activity could be contributing to MRI changes, but is not favored to be primary etiology at this time.     cvEEG demonstrated LPD+F c/f IIC with possible ictal features.  Transient improvement on EEG with LEV load (transitioned to burst attenuation pattern with persistent bilateral posterior BIRDs). LCS load was added again with transient improvement.     # Diffuse hyperreflexia: On exam noted to have hyperreflexia in bilateral upper extremities with positive hoffman's and pectoralis reflexes, while jaw jerk reflex was absent. Bilateral clonus (4+ beats) also noted. No recent serotonergic medications nor fever or rigidity to suspect a serotonin syndrome. Potentially related to question of a possible C2 vertebral body fracture that was noted on CT cervical spine in May 2024 vs known upper motor neuron changes on MRI brain.     Recommendations:  Continue cvEEG, Keppra, 1g BID (renally dosed), and Vimpat 100 mg BID   Given unable to assess PR interval given afib, would not recommend increasing Vimpat further.  Neuro team will continue to review EEG data. Recommend primary team page neurology consult resident if concern for further seizure prior to loading additional seizure medications. Given elevated Cystatin C and cirrhosis, would consider Onfi load as third option if needed.  MRI C-spine wwo  contrast per primary team  Management of primary medical issues including NASH cirrhosis and elevated ammonia per primary team.     This patient was seen and discussed with Dr. Christiane, who agrees with the above assessment and plan.      Levander Severin, [name]  Resident Physician PGY-4  Department of Neurology    I reviewed the APP/resident's documentation and I agree with the assessment and plan. I independently spent 75 minutes, excluding procedures, in critical care time examining the patient, evaluating the hemodynamic, laboratory, and radiographic data, developing a comprehensive management plan, and serially assessing the patient's response to our critical care interventions. Total billable critical care time 75 minutes.    ICU Problem List:  Neuro:Multifactorial toxic-metabolic encephalopathy. Non-convulsive seizures. Diffuse bilateral posterior cortical diffusion restriction and subcortical FLAIR hyperintensities  Cardiac: Atrial fibrillation with RVR. PEA arrest   Pulm: Prolonged mechanical ventilation secondary to prolonged critical illness, hypoxia and hypercarbia  Renal: Hypokalemia. Hypernatremia.   HP:Izrnfezwdjuzi cirrhosis. GI Bleed  ID: Immunocompromised state.  Heme: Sigmoid mass c/f malignancy    Plan per resident note above    [name]   HPI        Reason for Consult: c/f seizure    [name] is a 78 y.o. female on whom I have been asked by Morene ONEIDA Dec, [name] to consult for c/f seizure.    Interval History 11/18:   - Overnight was found to have EEG findings c/w IIC (LPDs+F), prompting initiation of LEV+LCS with some transient improvement  - MRI Brain w/wo was obtained, demosntrating diffuse bilateral parieto-occipital restricted diffusion. Case discussed on Neuroradiology case conference, felt to be most compatible with toxic-metabolic primary etiology.   - Re-reviewed overall history given complicated course, as detailed more extensively in A&P  - Exam now with midline gaze and intact OCRs, overall improved from prior    Initial HPI:  78 yo PMHx HFpEF, afib, and MASLD cirrhosis (MELD 15) admitted for respiratory failure, encephalopathy, and septic shock course with c/b hypernatremia.     At around 1930 on 11/17 was noted to have roving horizontal eye movements. Movements were nonrhythmic but persisted for several hours raising primary teams concerns for possible seizure. They gave 4mg  versed  and per their report movements only slowed down for a brief duration.     No reported facial twitching, limb shaking, fixed lateralized gaze, urinary incontinence, or tongue injury. Per chart review, no past history of seizures.     Per nursing report, patient has gradually become less and less responsive over the last 3 days. She was following commands and moving extremities (squeeze hands, wiggle toes) on 11/14, but now is unresponsive and has no spontaneous movement.     Lactulose  was held for intermittent periods starting 11/12 and was discontinued on 11/15 due to increased bowel movement frequency (per chart review had 5 BMs on 11/15). Since worsened mentation on 11/16,  lactulose  was resumed.   Ammonia 291 on 11/16, previously 160 on 10/24    Earlier in ICU stay had E. Coli bacteremia -Finished course of ceftriaxone .     On a CTA abdomen in October 2025 was noted to have a chronic T12 compression fracture.   On a CT cervical spine in May 2024 was noted to have questionable lucency involving the left lateral aspect of C2 vertebral body.     Has known history of osteoporosis identified on Dexa scan of femoral neck (11/2022 T score -2.9).     Allergies[1]  Current Medications[2] Prescriptions Prior to Admission[3]    Past Medical History[4]    Past Surgical History[5]    Social History[6]    Family History[7]    Code Status: Full Code     Review of Systems     Unable to obtain due to patient's mental status.       Objective        Temp:  [36.9 ??C (98.5 ??F)-37.8 ??C (100 ??F)] 37.2 ??C (98.9 ??F)  Pulse:  [89-127] 123  SpO2 Pulse:  [73-131] 117  Resp:  [14-34] 23  BP: (99-172)/(32-87) 169/54  MAP (mmHg):  [54-112] 94  FiO2 (%):  [30 %] 30 %  SpO2:  [94 %-100 %] 99 %  No intake/output data recorded.      Physical Exam:  General Exam:  General: Lying in bed. No obvious distress.   ENT:  Mucous membranes moist. Oropharynx clear.  Cardiovascular:  Regular rate and rhythm.   Respiratory: trach in place.  Gastrointestinal: soft  Extremities: Edema in upper extremities bilateral.   Edema in lower extremities bilateral.    Skin: No obvious rashes or ecchymoses.    Neurological Exam:  Mental Status  LOC: does not arouse  Orientation: UTA  Speech: UTA    Cranial Nerves  Pupils: PERRL 5mm -> 4mm  Corneals: Present bilaterally to saline.   Gaze: Midline gaze. Previously: slow roving, nonrhythmic, horizontal eye movements   Face Motor: normal and symmetric  Oculocephalic: present  Cough: strong  Gag: strong    Motor:   RUE: no response  LUE: no response  RLE: brisk toe and foot extension to light touch over the dorsum of the foot and triple flexion to noxious  LLE: toe and foot extension to light touch over the dorsum of the foot and triple flexion to noxious     Sensory:  response to light touch and noxious as above    Reflexes:   Reflexes Right Left   Biceps  C5 3+ 3+   Brachioradialis C6  3+ 3+   Triceps C7 3+ 3+   Patella L3 (4) N/a N/a   Achilles S1 (S2) >4+ beats clonus >4+ beats clonus   Plantar Response Extensor Extensor   Jaw Jerk CN V Absent   Pectoralis C5-T1 Present Present   Hoffman Present Absent   Cross Adductors L2-4 Absent Absent     Glasgow Coma Score  Motor: flexion = 3  Verbal: no verbal response = 1  Eyes: eyes do not open = 1       Diagnostic Studies      All Labs Last 24hrs:   Recent Results (from the past 24 hours)   aPTT    Collection Time: 04/18/24  2:15 PM   Result Value Ref Range    APTT 53.0 (H) 24.8 - 38.4 sec    Heparin Correlation 0.3    Blood Gas, Venous    Collection Time: 04/18/24  4:19 PM   Result Value Ref Range    Specimen Source Venous     FIO2 Venous Not Specified     pH, Venous 7.45 (H) 7.32 - 7.43    pCO2, Ven 39 (L) 40 - 60 mm Hg    pO2, Ven 45 30 - 55 mm Hg    HCO3, Ven 26 22 - 27 mmol/L    Base Excess, Ven 2.4 (H) -2.0 - 2.0    O2 Saturation, Venous 76.8 40.0 - 85.0 %    Carboxyhemoglobin, Venous 1.9 (H) <1.2 %  Methemoglobin, Venous <1.0 <1.5 %    Oxyhemoglobin Venous 75.3 40.0 - 85.0 %   Basic Metabolic Panel    Collection Time: 04/19/24  3:48 AM   Result Value Ref Range    Sodium 147 (H) 135 - 145 mmol/L    Potassium 3.8 3.4 - 4.8 mmol/L    Chloride 109 (H) 98 - 107 mmol/L    CO2 28.0 20.0 - 31.0 mmol/L    Anion Gap 10 5 - 14 mmol/L    BUN 19 9 - 23 mg/dL    Creatinine 9.54 (L) 0.55 - 1.02 mg/dL    BUN/Creatinine Ratio 42     eGFR CKD-EPI (2021) Female >90 >=60 mL/min/1.38m2    Glucose 155 70 - 179 mg/dL    Calcium 8.5 (L) 8.7 - 10.4 mg/dL   Hepatic Function Panel    Collection Time: 04/19/24  3:48 AM   Result Value Ref Range    Albumin 2.7 (L) 3.4 - 5.0 g/dL    Total Protein 5.5 (L) 5.7 - 8.2 g/dL    Total Bilirubin 1.0 0.3 - 1.2 mg/dL    Bilirubin, Direct 9.49 (H) 0.00 - 0.30 mg/dL    AST 31 <=65 U/L    ALT 14 10 - 49 U/L    Alkaline Phosphatase 166 (H) 46 - 116 U/L   Magnesium  Level    Collection Time: 04/19/24  3:48 AM   Result Value Ref Range    Magnesium  2.2 1.6 - 2.6 mg/dL   Phosphorus Level    Collection Time: 04/19/24  3:48 AM   Result Value Ref Range    Phosphorus 2.8 2.4 - 5.1 mg/dL   Cystatin C    Collection Time: 04/19/24  3:48 AM   Result Value Ref Range    Cystatin C 1.58 (H) 0.64 - 1.23 mg/L    eGFR CKD-EPI (2012) Cystatin C Female 37 (L) >=60 mL/min/1.83m2   Blood Gas, Venous    Collection Time: 04/19/24  3:48 AM   Result Value Ref Range    Specimen Source Venous     FIO2 Venous Not Specified     pH, Venous 7.40 7.32 - 7.43    pCO2, Ven 46 40 - 60 mm Hg    pO2, Ven 42 30 - 55 mm Hg    HCO3, Ven 28 (H) 22 - 27 mmol/L    Base Excess, Ven 3.0 (H) -2.0 - 2.0    O2 Saturation, Venous 74.6 40.0 - 85.0 %    Carboxyhemoglobin, Venous 1.9 (H) <1.2 %    Methemoglobin, Venous <1.0 <1.5 %    Oxyhemoglobin Venous 72.7 40.0 - 85.0 %   APTT    Collection Time: 04/19/24  3:48 AM   Result Value Ref Range    APTT 82.6 (H) 24.8 - 38.4 sec    Heparin Correlation 0.5    CBC w/ Differential    Collection Time: 04/19/24  3:48 AM   Result Value Ref Range    WBC 4.4 3.6 - 11.2 10*9/L    RBC 2.52 (L) 3.95 - 5.13 10*12/L    HGB 8.2 (L) 11.3 - 14.9 g/dL    HCT 74.9 (L) 65.9 - 44.0 %    MCV 99.1 (H) 77.6 - 95.7 fL    MCH 32.6 (H) 25.9 - 32.4 pg    MCHC 32.9 32.0 - 36.0 g/dL    RDW 80.0 (H) 87.7 - 15.2 %    MPV 9.4 6.8 - 10.7 fL    Platelet 133 (L)  150 - 450 10*9/L    Anisocytosis Moderate (A) Not Present     CT head 11/17   No acute abnormalities on personal review    MRI Brain W Wo Contrast, 04/03/24  Impression  Multiple sequences are degraded due to motion which limits evaluation.  Within the limitations no acute intracranial abnormality.  Severe periventricular and deep white matter T2/FLAIR hyperintense signal compatible with small vessel ischemic changes. Multiple microhemorrhages as described. Consider cognitive assessment.  Remote left cerebellar infarct.  Small right mastoid effusion.                      [1]   Allergies  Allergen Reactions    Rocuronium Anaphylaxis     Suspected intraoperative anaphylaxis 04/15/2024. See anesthetic from tracheostomy. Tryptase pending at time of listing.     Sulfa (Sulfonamide Antibiotics) Anaphylaxis    Sulfur  Anaphylaxis     Tolerated sulfur  colloid injection without incident 09/03/2020.    Aspirin      thrombocytopenia   [2]   Current Facility-Administered Medications   Medication Dose Route Frequency Provider Last Rate Last Admin    acetaminophen  (TYLENOL ) tablet 650 mg  650 mg Oral Q4H PRN Sines, Benjamin Jacob, [name]   650 mg at 04/12/24 1214    cyanocobalamin (vitamin B-12) tablet 1,000 mcg  1,000 mcg Enteral tube: gastric Daily Sines, Benjamin Jacob, [name]   1,000 mcg at 04/18/24 0825    docusate sodium (COLACE) capsule 100 mg  100 mg Oral BID PRN Sines, Benjamin Jacob, [name]        esomeprazole (NEXIUM) granules 40 mg  40 mg Enteral tube: gastric Daily Sines, Benjamin Jacob, [name]   40 mg at 04/18/24 0825    famotidine (PEPCID) tablet 20 mg  20 mg Enteral tube: gastric BID Sweeney, [name] A, [name]   20 mg at 04/18/24 2004    flu vacc 2025-26 (65 yr up) (PF)(FLUAD)45 mcg(64mcgx3)/0.5 ml IM syringe  0.5 mL Intramuscular During hospitalization Sines, Benjamin Jacob, [name]        folic acid  (FOLVITE ) tablet 1 mg  1 mg Enteral tube: gastric Daily Sines, Benjamin Jacob, [name]   1 mg at 04/18/24 0825    heparin (porcine) 1000 unit/mL injection 2,000 Units  2,000 Units Intravenous Q6H PRN Sines, Benjamin Jacob, [name]        heparin 25,000 Units/250 mL (100 units/mL) in 0.45% saline infusion (premade)  0-24 Units/kg/hr Intravenous Continuous SeilheimerWaddell HERO, DO 11.41 mL/hr at 04/19/24 0232 11 Units/kg/hr at 04/19/24 0232    lacosamide (VIMPAT) injection 100 mg  100 mg Intravenous BID Dorise Hove, [name]        lactulose  oral solution  30 g Enteral tube: gastric SCARLET Dorise Hove, [name]   30 g at 04/19/24 0348    levETIRAcetam (KEPPRA) injection 1,000 mg  1,000 mg Intravenous Q12H [facility] Dorise Hove, [name]        melatonin tablet 3 mg  3 mg Enteral tube: gastric QPM [place], Chinonyerem A, [name]   3 mg at 04/18/24 1804    [Provider Hold] metoPROLOL tartrate (Lopressor) tablet 25 mg  25 mg Enteral tube: gastric BID Sines, Benjamin Jacob, [name]   25 mg at 04/16/24 2110    multivitamins, therapeutic with minerals tablet 1 tablet  1 tablet Enteral tube: gastric Daily Sines, Benjamin Jacob, [name]   1 tablet at 04/18/24 0825    NORepinephrine 8 mg in dextrose  5 % 250 mL (32 mcg/mL) infusion PMB  0-30 mcg/min Intravenous Continuous  Dorise Hove, [name]   Paused at 04/19/24 0050    rifAXIMin  (XIFAXAN ) oral suspension  550 mg Enteral tube: gastric BID Sines, Benjamin Jacob, [name] 550 mg at 04/18/24 2004    sodium chloride  (NS) 0.9 % flush 10 mL  10 mL Intravenous Q8H Sines, Benjamin Jacob, [name]   10 mL at 04/18/24 1804    sodium chloride  (NS) 0.9 % flush 10 mL  10 mL Intravenous Q8H Violette Morene Cadet, [name]   10 mL at 04/18/24 1804    thiamine  mononitrate (vit B1) tablet 100 mg  100 mg Enteral tube: gastric Daily Sines, Benjamin Jacob, [name]   100 mg at 04/18/24 0825   [3]   Medications Prior to Admission   Medication Sig Dispense Refill Last Dose/Taking    anastrozole  (ARIMIDEX ) 1 mg tablet Take 1 tablet (1 mg total) by mouth daily. 90 tablet 3 03/10/2024 at  6:00 AM    carvedilol  (COREG ) 3.125 MG tablet Take 1 tablet (3.125 mg total) by mouth two (2) times a day. 180 tablet 2 03/10/2024 Morning    folic acid  (FOLVITE ) 1 MG tablet Take 1 tablet (1 mg total) by mouth daily.   03/10/2024 at  6:00 AM    furosemide (LASIX) 40 MG tablet Take 1 tablet (40 mg total) by mouth daily as needed. 30 tablet 11 03/10/2024 at  6:00 AM    magnesium  oxide (MAG-OX) 400 mg (241.3 mg elemental magnesium ) tablet Take 1 tablet (400 mg total) by mouth two (2) times a day. 180 tablet 3 03/10/2024 at  6:00 AM    potassium chloride  20 MEQ ER tablet Take 1 tablet (20 mEq total) by mouth two (2) times a day. 60 tablet 11 03/09/2024 at  6:00 AM    spironolactone (ALDACTONE) 25 MG tablet Take 1 tablet (25 mg total) by mouth daily. 90 tablet 1 03/10/2024 at  6:00 AM    thiamine  (B-1) 100 MG tablet Take 1 tablet (100 mg total) by mouth daily.   03/10/2024 Morning    vitamin E-268 mg, 400 UNIT, 268 mg (400 UNIT) capsule Take 800 mg by mouth in the morning. 2 tabs.   03/10/2024 Morning    acetaminophen  (TYLENOL ) 325 MG tablet Take 2 tablets (650 mg total) by mouth every six (6) hours as needed for pain.  0     [Paused] alendronate  (FOSAMAX ) 70 MG tablet Take 1 tablet (70 mg total) by mouth every seven (7) days. (Patient not taking: Reported on 03/07/2024) 12 tablet 3     calcium carbonate (OS-CAL) 1,250 mg (500 mg elem calcium) tablet Take 1 tablet (500 mg elem calcium total) by mouth in the morning. (Patient not taking: Reported on 03/07/2024)       cyanocobalamin 1000 MCG tablet Take 1 tablet (1,000 mcg total) by mouth daily.       [EXPIRED] nystatin  (MYCOSTATIN ) 100,000 unit/gram powder Apply to affected area 3 times daily 15 g 0    [4]   Past Medical History:  Diagnosis Date    A-fib (CMS-HCC)     Alcoholism    (CMS-HCC)     Alcoholism /alcohol abuse     Cirrhosis    (CMS-HCC)     Depression     Hypertension     Liver disease     Malignant neoplasm of overlapping sites of right breast in female, estrogen receptor positive    (CMS-HCC) 10/03/2020   [5]   Past Surgical History:  Procedure Laterality Date    BREAST BIOPSY Right  benign-a long time ago    BREAST BIOPSY Right 07/2020    malignant    BREAST LUMPECTOMY Right     4 2022    CENTRAL LINE  03/25/2024    CHOLECYSTECTOMY      PR BX/REMV,LYMPH NODE,DEEP AXILL Right 09/03/2020    Procedure: BX/EXC LYMPH NODE; OPEN, DEEP AXILRY NODE;  Surgeon: Alm Elsie Como, [name];  Location: ASC OR [facility];  Service: Surgical Oncology Breast    PR CARDIOVERSION, ELECTIVE;EXTERN N/A 03/10/2024    Procedure: CARDIOVERSION, ELECTIVE, ELECTRICAL CONVERSION OF ARRHYTHMIA; EXTERNAL;  Surgeon: Sedalia Velma Hamilton, [name];  Location: [facility] OR [facility];  Service: Cardiology    PR ECHO HEART,TRANSESOPHAGEAL,COMPLETE Midline 03/10/2024    Procedure: ECHOCARDIOGRAPHY, TRANSESOPHAGEAL, REAL-TIME WITH IMAGE DOCUMENTATION;  Surgeon: Sedalia Velma Hamilton, [name];  Location: [facility] OR [facility];  Service: Cardiology    PR INTRAOPERATIVE SENTINEL LYMPH NODE ID W DYE INJECTION Right 09/03/2020    Procedure: INTRAOPERATIVE IDENTIFICATION SENTINEL LYMPH NODE(S) INCLUDE INJECTION NON-RADIOACTIVE DYE, WHEN PERFORMED;  Surgeon: Alm Elsie Como, [name];  Location: ASC OR [facility];  Service: Surgical Oncology Breast    PR MASTECTOMY, PARTIAL Right 09/03/2020    Procedure: MASTECTOMY, PARTIAL (EG, LUMPECTOMY, TYLECTOMY, QUADRANTECTOMY, SEGMENTECTOMY);  Surgeon: Alm Elsie Como, [name];  Location: ASC OR [facility];  Service: Surgical Oncology Breast    PR TRACHEOSTOMY, PLANNED N/A 04/15/2024    Procedure: TRACHEOSTOMY PLANNED (SEPART PROC);  Surgeon: Pixie Loader, [name];  Location: OR [facility];  Service: ENT    PR UPPER GI ENDOSCOPY,BIOPSY N/A 02/03/2018    Procedure: UGI ENDOSCOPY; WITH BIOPSY, SINGLE OR MULTIPLE;  Surgeon: Eleanor Dewey Sorrel, [name];  Location: [facility] PROCED[facility];  Service: Gastroenterology    RADIATION Right     unsure finish date    TUBAL LIGATION     [6]   Social History  Socioeconomic History    Marital status: Married     Spouse name: None    Number of children: None    Years of education: None    Highest education level: None   Tobacco Use    Smoking status: Never     Passive exposure: Past    Smokeless tobacco: Never   Vaping Use    Vaping status: Never Used   Substance and Sexual Activity    Alcohol use: Not Currently    Drug use: Never    Sexual activity: Not Currently   Social History Narrative    Daughter fills med boxes weekly.      Social Drivers of Health     Food Insecurity: No Food Insecurity (03/02/2024)    Hunger Vital Sign     Worried About Running Out of Food in the Last Year: Never true     Ran Out of Food in the Last Year: Never true   Tobacco Use: Low Risk (03/30/2024)    Patient History     Smoking Tobacco Use: Never     Smokeless Tobacco Use: Never     Passive Exposure: Past   Transportation Needs: No Transportation Needs (03/02/2024)    PRAPARE - Transportation     Lack of Transportation (Medical): No     Lack of Transportation (Non-Medical): No   Alcohol Use: Not At Risk (04/20/2023)    Alcohol Use     How often do you have a drink containing alcohol?: Never     How many drinks containing alcohol do you have on a typical day when you are drinking?: 1 - 2     How  often do you have 5 or more drinks on one occasion?: Never   Housing: Low Risk (03/02/2024)    Housing     Within the past 12 months, have you ever stayed: outside, in a car, in a tent, in an overnight shelter, or temporarily in someone else's home (i.e. couch-surfing)?: No     Are you worried about losing your housing?: No   Physical Activity: Inactive (04/20/2023)    Exercise Vital Sign     Days of Exercise per Week: 0 days     Minutes of Exercise per Session: 0 min   Utilities: Low Risk (04/20/2023)    Utilities     Within the past 12 months, have you been unable to get utilities (heat, electricity) when it was really needed?: No   Stress: No Stress Concern Present (04/20/2023)    [employer] of Occupational Health - Occupational Stress Questionnaire     Feeling of Stress : Only a little   Interpersonal Safety: Patient Unable To Answer (03/11/2024)    Interpersonal Safety     Unsafe Where You Currently Live: Patient unable to answer     Physically Hurt by Anyone: Patient unable to answer     Abused by Anyone: Patient unable to answer   Substance Use: Low Risk (04/20/2023)    Substance Use     In the past year, how often have you used prescription drugs for non-medical reasons?: Never     In the past year, how often have you used illegal drugs?: Never     In the past year, have you used any substance for non-medical reasons?: No   Social Connections: Moderately Isolated (04/20/2023)    Social Connection and Isolation Panel     Frequency of Communication with Friends and Family: More than three times a week     Frequency of Social Gatherings with Friends and Family: More than three times a week     Attends Religious Services: Never     Database Administrator or Organizations: No     Attends Engineer, Structural: Never     Marital Status: Married   Programmer, Applications: Low Risk (03/02/2024)    Overall Financial Resource Strain (CARDIA)     Difficulty of Paying Living Expenses: Not hard at all   Health Literacy: Low Risk (04/20/2023)    Health Literacy     : Never   Internet Connectivity: No Internet connectivity concern identified (04/20/2023)    Internet Connectivity     Do you have access to internet services: Yes     How do you connect to the internet: Personal Device at home     Is your internet connection strong enough for you to watch video on your device without major problems?: Yes     Do you have enough data to get through the month?: Yes     Does at least one of the devices have a camera that you can use for video chat?: Yes   [7]   Family History  Problem Relation Age of Onset    Heart disease Mother     No Known Problems Father     No Known Problems Sister     No Known Problems Daughter     No Known Problems Maternal Grandmother     No Known Problems Maternal Grandfather     No Known Problems Paternal Grandmother     No Known Problems Paternal Grandfather     Lupus Brother  No Known Problems Other     BRCA 1/2 Neg Hx     Breast cancer Neg Hx     Cancer Neg Hx     Colon cancer Neg Hx     Endometrial cancer Neg Hx     Ovarian cancer Neg Hx     Mental illness Neg Hx     Substance Abuse Disorder Neg Hx

## 2024-04-20 LAB — BLOOD GAS, VENOUS
BASE EXCESS VENOUS: 2.4 — ABNORMAL HIGH (ref -2.0–2.0)
BASE EXCESS VENOUS: 2.1 — ABNORMAL HIGH (ref -2.0–2.0)
CARBOXYHEMOGLOBIN, VENOUS: 1.5 % — ABNORMAL HIGH (ref <1.2)
CARBOXYHEMOGLOBIN, VENOUS: 1.7 % — ABNORMAL HIGH (ref <1.2)
HCO3 VENOUS: 27 mmol/L (ref 22–27)
HCO3 VENOUS: 28 mmol/L — ABNORMAL HIGH (ref 22–27)
METHEMOGLOBIN, VENOUS: <1 % (ref <1.5)
METHEMOGLOBIN, VENOUS: <1 % (ref <1.5)
O2 SATURATION VENOUS: 74 % (ref 40.0–85.0)
O2 SATURATION VENOUS: 72.6 % (ref 40.0–85.0)
OXYHEMOGLOBIN, VENOUS: 72.4 % (ref 40.0–85.0)
OXYHEMOGLOBIN, VENOUS: 71 % (ref 40.0–85.0)
PCO2 VENOUS: 43 mmHg (ref 40–60)
PCO2 VENOUS: 47 mmHg (ref 40–60)
PH VENOUS: 7.41 (ref 7.32–7.43)
PH VENOUS: 7.38 (ref 7.32–7.43)
PO2 VENOUS: 42 mmHg (ref 30–55)
PO2 VENOUS: 42 mmHg (ref 30–55)

## 2024-04-20 LAB — CBC W/ AUTO DIFF
BASOPHILS ABSOLUTE COUNT: 0 10*9/L (ref 0.0–0.1)
BASOPHILS RELATIVE PERCENT: 0.6 %
EOSINOPHILS ABSOLUTE COUNT: 0.2 10*9/L (ref 0.0–0.5)
EOSINOPHILS RELATIVE PERCENT: 2.6 %
HEMATOCRIT: 27 % — ABNORMAL LOW (ref 34.0–44.0)
HEMOGLOBIN: 8.8 g/dL — ABNORMAL LOW (ref 11.3–14.9)
LYMPHOCYTES ABSOLUTE COUNT: 1.3 10*9/L (ref 1.1–3.6)
LYMPHOCYTES RELATIVE PERCENT: 22.4 %
MEAN CORPUSCULAR HEMOGLOBIN CONC: 32.7 g/dL (ref 32.0–36.0)
MEAN CORPUSCULAR HEMOGLOBIN: 32.3 pg (ref 25.9–32.4)
MEAN CORPUSCULAR VOLUME: 98.8 fL — ABNORMAL HIGH (ref 77.6–95.7)
MEAN PLATELET VOLUME: 9.2 fL (ref 6.8–10.7)
MONOCYTES ABSOLUTE COUNT: 0.6 10*9/L (ref 0.3–0.8)
MONOCYTES RELATIVE PERCENT: 9.6 %
NEUTROPHILS ABSOLUTE COUNT: 3.8 10*9/L (ref 1.8–7.8)
NEUTROPHILS RELATIVE PERCENT: 64.8 %
PLATELET COUNT: 154 10*9/L (ref 150–450)
RED BLOOD CELL COUNT: 2.73 10*12/L — ABNORMAL LOW (ref 3.95–5.13)
RED CELL DISTRIBUTION WIDTH: 19.4 % — ABNORMAL HIGH (ref 12.2–15.2)
WBC ADJUSTED: 5.8 10*9/L (ref 3.6–11.2)

## 2024-04-20 LAB — CYSTATIN C
CYSTATIN C: 1.64 mg/L — ABNORMAL HIGH (ref 0.64–1.23)
EGFR CKD-EPI (2012) CYSTATIN C FEMALE: 35 mL/min/1.73m2 — ABNORMAL LOW (ref >=60)

## 2024-04-20 LAB — BASIC METABOLIC PANEL
ANION GAP: 9 mmol/L (ref 5–14)
BLOOD UREA NITROGEN: 19 mg/dL (ref 9–23)
BUN / CREAT RATIO: 43
CALCIUM: 8.6 mg/dL — ABNORMAL LOW (ref 8.7–10.4)
CHLORIDE: 110 mmol/L — ABNORMAL HIGH (ref 98–107)
CO2: 28 mmol/L (ref 20.0–31.0)
CREATININE: 0.44 mg/dL — ABNORMAL LOW (ref 0.55–1.02)
EGFR CKD-EPI (2021) FEMALE: >90 mL/min/1.73m2 (ref >=60)
GLUCOSE RANDOM: 166 mg/dL (ref 70–179)
POTASSIUM: 3.7 mmol/L (ref 3.4–4.8)
SODIUM: 147 mmol/L — ABNORMAL HIGH (ref 135–145)

## 2024-04-20 LAB — MAGNESIUM: MAGNESIUM: 2.3 mg/dL (ref 1.6–2.6)

## 2024-04-20 LAB — VITAMIN D 25 HYDROXY: VITAMIN D, TOTAL (25OH): 13.7 ng/mL — ABNORMAL LOW (ref 20.0–80.0)

## 2024-04-20 LAB — HEPATIC FUNCTION PANEL
ALBUMIN: 2.8 g/dL — ABNORMAL LOW (ref 3.4–5.0)
ALKALINE PHOSPHATASE: 182 U/L — ABNORMAL HIGH (ref 46–116)
ALT (SGPT): 17 U/L (ref 10–49)
AST (SGOT): 35 U/L — ABNORMAL HIGH (ref <=34)
BILIRUBIN DIRECT: 0.5 mg/dL — ABNORMAL HIGH (ref 0.00–0.30)
BILIRUBIN TOTAL: 0.9 mg/dL (ref 0.3–1.2)
PROTEIN TOTAL: 5.9 g/dL (ref 5.7–8.2)

## 2024-04-20 LAB — SLIDE REVIEW

## 2024-04-20 LAB — APTT
APTT: 74.7 s — ABNORMAL HIGH (ref 24.8–38.4)
HEPARIN CORRELATION: 0.4

## 2024-04-20 LAB — PHOSPHORUS: PHOSPHORUS: 2.4 mg/dL (ref 2.4–5.1)

## 2024-04-20 MED ADMIN — sodium chloride (NS) 0.9 % flush 10 mL: 10 mL | INTRAVENOUS | @ 08:00:00

## 2024-04-20 MED ADMIN — sodium chloride (NS) 0.9 % flush 10 mL: 10 mL | INTRAVENOUS

## 2024-04-20 MED ADMIN — sodium chloride (NS) 0.9 % flush 10 mL: 10 mL | INTRAVENOUS | @ 15:00:00

## 2024-04-20 MED ADMIN — rifAXIMin (XIFAXAN) oral suspension: 550 mg | GASTROENTERAL | @ 15:00:00 | Stop: 2025-04-11

## 2024-04-20 MED ADMIN — rifAXIMin (XIFAXAN) oral suspension: 550 mg | GASTROENTERAL | @ 02:00:00 | Stop: 2025-04-11

## 2024-04-20 MED ADMIN — lactulose oral solution: 30 g | GASTROENTERAL | @ 07:00:00

## 2024-04-20 MED ADMIN — lactulose oral solution: 30 g | GASTROENTERAL | @ 09:00:00

## 2024-04-20 MED ADMIN — lactulose oral solution: 30 g | GASTROENTERAL | @ 22:00:00

## 2024-04-20 MED ADMIN — lactulose oral solution: 30 g | GASTROENTERAL | @ 03:00:00

## 2024-04-20 MED ADMIN — lactulose oral solution: 30 g | GASTROENTERAL | @ 01:00:00

## 2024-04-20 MED ADMIN — lactulose oral solution: 30 g | GASTROENTERAL | @ 15:00:00

## 2024-04-20 MED ADMIN — lactulose oral solution: 30 g | GASTROENTERAL | @ 13:00:00

## 2024-04-20 MED ADMIN — lactulose oral solution: 30 g | GASTROENTERAL

## 2024-04-20 MED ADMIN — folic acid (FOLVITE) tablet 1 mg: 1 mg | GASTROENTERAL | @ 15:00:00

## 2024-04-20 MED ADMIN — metoPROLOL tartrate (Lopressor) tablet 25 mg: 25 mg | GASTROENTERAL | @ 02:00:00

## 2024-04-20 MED ADMIN — metoPROLOL tartrate (Lopressor) tablet 25 mg: 25 mg | GASTROENTERAL | @ 15:00:00

## 2024-04-20 MED ADMIN — thiamine mononitrate (vit B1) tablet 100 mg: 100 mg | GASTROENTERAL | @ 15:00:00

## 2024-04-20 MED ADMIN — cyanocobalamin (vitamin B-12) tablet 1,000 mcg: 1000 ug | GASTROENTERAL | @ 15:00:00

## 2024-04-20 MED ADMIN — lacosamide (VIMPAT) injection 100 mg: 100 mg | INTRAVENOUS | @ 02:00:00

## 2024-04-20 MED ADMIN — lacosamide (VIMPAT) injection 100 mg: 100 mg | INTRAVENOUS | @ 15:00:00

## 2024-04-20 MED ADMIN — famotidine (PEPCID) tablet 20 mg: 20 mg | GASTROENTERAL | @ 15:00:00

## 2024-04-20 MED ADMIN — famotidine (PEPCID) tablet 20 mg: 20 mg | GASTROENTERAL | @ 02:00:00

## 2024-04-20 MED ADMIN — melatonin tablet 3 mg: 3 mg | GASTROENTERAL

## 2024-04-20 MED ADMIN — esomeprazole (NEXIUM) granules 40 mg: 40 mg | GASTROENTERAL | @ 15:00:00

## 2024-04-20 MED ADMIN — multivitamins, therapeutic with minerals tablet 1 tablet: 1 | GASTROENTERAL | @ 15:00:00

## 2024-04-20 MED ADMIN — levETIRAcetam (KEPPRA) injection 1,000 mg: 1000 mg | INTRAVENOUS | @ 02:00:00

## 2024-04-20 MED ADMIN — levETIRAcetam (KEPPRA) injection 1,000 mg: 1000 mg | INTRAVENOUS | @ 15:00:00

## 2024-04-20 MED ADMIN — heparin 25,000 Units/250 mL (100 units/mL) in 0.45% saline infusion (premade): 0-24 [IU]/kg/h | INTRAVENOUS | @ 08:00:00

## 2024-04-20 MED ADMIN — heparin 25,000 Units/250 mL (100 units/mL) in 0.45% saline infusion (premade): 0-24 [IU]/kg/h | INTRAVENOUS | @ 22:00:00

## 2024-04-20 MED ADMIN — potassium phosphate 15 mmol in sodium chloride (NS) 0.9 % 250 mL infusion: 15 mmol | INTRAVENOUS | @ 11:00:00 | Stop: 2024-04-20

## 2024-04-20 NOTE — Plan of Care (Signed)
 Shift Summary  Tracheostomy site care was performed regularly, and a tracheostomy change was completed by ENT MD in the afternoon.   Potassium phosphate IVPB was administered in the morning.   Frequent repositioning and skin protection interventions were maintained to address skin integrity concerns.   Comfort was maintained throughout the shift, as indicated by consistently low CPOT scores.   Overall, device function and airway patency were maintained, and the patient remained obtunded.     Skin Health and Integrity: Moisture associated dermatitis and bruising were present in the skin folds and perineum throughout the shift, with frequent repositioning, pressure reduction techniques, and skin protection interventions maintained; tracheostomy site care was performed regularly, and by the end of the shift, the site was clean and dry.     Optimal Comfort and Wellbeing: CPOT scores remained at 0 and facial expressions were relaxed and neutral, suggesting comfort was maintained during the shift.     Effective Breathing Pattern: Respiratory rate fluctuated, with periods of tachypnea and abnormal values, but airway patency was maintained and pulmonary hygiene promoted; pressure support was reduced early in the shift and then kept stable, with ventilator mode remaining PSV-CPAP.     Optimal Cognitive Function: Level of consciousness remained altered and obtunded, with persistent positive CAM-ICU features for acute onset, inattention, and altered consciousness throughout the shift.     Optimal Device Function: Tracheostomy remained patent and secure, with regular site, inner cannula, and tie assessments and care performed; a trach change was completed by MD in the afternoon.

## 2024-04-20 NOTE — Progress Notes (Signed)
 MICU Daily Progress Note     Date of Service: 04/20/2024    Problem List:   Principal Problem:    Bacteremia, escherichia coli  Active Problems:    Alcoholic liver disease (HHS-HCC)    HTN (hypertension)    Depression with anxiety    Class 2 severe obesity with serious comorbidity in adult    Atrial fibrillation    (CMS-HCC)    Pleural effusion    Hypernatremia    Thrombocytopenia    Cirrhosis    (CMS-HCC)    A-fib (CMS-HCC)    Hepatic encephalopathy    (CMS-HCC)    Anaphylaxis      Interval history: [name] is a 78 y.o. female with HFpEF, HTN, Afib on AC, MASLD cirrhosis, originally hospitalized for scheduled TEE/DCCV (aborted d/t LAA clot) c/b AHHRF, encephalopathy and septic shock requiring MICU care, was s/p extubation transferred to [facility] for further management of anticoagulation and hypernatremia. Transferred back to [facility] MICU for closer monitoring for respiratory status, pressor requirement, GI bleed, and management of carotid artery injury.     On 10/24, patient had acute worsening of respiratory status leading requiring intubation. When patient was given sedation for intubation, she coded and promptly had ROSC after a few minutes of CPR. Subsequently, CVC placed in neck, used to draw blood, unclear if given pressors through it, then found  to be in arterial system based on blood gas and imaging, likely right carotid. Line was subsequently removed, and pressure was held for 15 minutes, hemostasis with no hematoma. She was transferred to [facility] MICU for management of this carotid injury, to be evaluated by vascular surgery while also managing her increased vasopressor requirement and respiratory distress.  Has been weaned off pressors since 10/27 at 1 PM. VBG's indicating that she is ready for extubation, however her mentation suggests that she is not able to protect her airway.  She was extubated on 10/31, and mentation slowly improved.  Unfortunately on the morning of 11/2 she had increased lethargy, increased secretions, and she was not protecting her airway.  She was reintubated on 11/2, and required pressor support with norepinephrine and vasopressin after the induction agents.  Self extubated on 11/5. Re intubated on 11/6 due to hypercarbia and increased work of breathing.  We are left with the challenging options regarding the care of [name].  Due to her multiple failed extubations, we are concerned [name] will need a tracheostomy long-term to protect her airway.  There is a meeting with palliative care and family on 04/13/2024, her family agreed for tracheostomy.  Tracheostomy placed 11/14.  On 11/16, patient started presenting with altered mental status as she became hard to arouse and stopped following commands.  Sepsis workup was initiated.  She also had a lateral gaze on exam, so neurology was consulted.  EEG showed seizure activity and MRI brain showed diffuse cortical restricted diffusion greatest within the bilateral parietal -occipital regions.  Her ammonia level is also elevated to 291.  It is unclear if seizures are related to her ammonia level.  Patient placed on Keppra and Vimpat.    Plans for family meeting next week to discuss goals of care.  Palliative is involved.        Neurological   Hepatic encephalopathy - seizure-like activity  Per chart review at [facility], rapid response called on 10/24 for increased somnolence. At the time, differential included hepatic encephalopathy, toxic metabolic encephalopathy in the setting of shock (possibly sepsis), versus intracranial pathology.  She was subsequently intubated. CT head on 10/24 showed no acute intracranial abnormality. Weaned off sedation with slow improvements in neurological status.  While we have continued lactulose  therapy and seeing good response with several bowel movements, her mentation remains poor.  This is concerning for severe ICU delirium.  Meeting was had with daughter, [name], on 11/5.  Described that should intubation be necessary we would likely be moving towards a tracheostomy as [name] has continuously not been protecting her airway.  As of now [name] wanted to pursue aggressive measures including repeat intubation and potential tracheostomy. Tracheostomy placed 11/14.      Overnight on 04/18/2024, patient noted to have eye deviation along with decreased responsiveness on physical exam.  Head CT ordered and reassuring.  Neurology consulted.  EEG with evidence of seizure activity, therefore Keppra started per neurology.  Titrating lactulose  for goal stool output.  Neurological status has not improved as she only responds to pain.  She does not follow commands.    - Neurology following, appreciate recommendations  - Lactulose  to 30 g every 2 hours, holding parameters: >2L output   - Rifaximin  550 mg twice daily  - MRI brain shows diffuse cortical restricted diffusion greatest within the bilateral parieto-occipital regions, unclear etiology  -Continue Keppra 1 g twice daily  - Continue Vimpat 100 mg twice daily  -Neuro recommends continuing Keppra and monitoring for one more day on EEG. Seizure specialist will look at EEG and make additional med recs tomorrow     Analgesia: No pain issues  RASS at goal? N/A, not on sedation  [place] Agitation Assessment Scale (RASS) : -3 (04/20/2024  2:00 AM)    Pulmonary   Acute Hypoxic Hypercarbic Respiratory Failure   Rapid response called on 10/24 for increased somnolence, found to have acute hypercarbic respiratory failure and subsequently intubated. Suspect hypercarbic respiratory failure due to encephalopathy as above. CXR on 10/24 showed worsening pleural effusions. Not currently getting diuresed. Given patient's hypoxia, and AMS, there was concern for infectious etiology. MRSA nares negative. Infectious workup thus far is unremarkable. SBTs show readiness extubation based on VBGs, however there is concern that she continues to be unable to protect her airway due to her current mentation.  Reintubated on 11/2 followed by therapeutic bronchoscopy.  Self extubated on 11/5.  Reintubate noted on 11/6 due to hypercarbia and not protecting her airway. Trach placed 11/14, complicated by anaphylactic reaction to rocuronium which was treated with epi, benadryl and steroids. Allergy added to chart.    - Continue mechanical ventilation, weaning as tolerated  - ENT following, appreciate recommendations  - Plan for first trach exchange with ENT on POD 5  -We have noticed some skin breakdown around her trach site, ENT is aware  -Tolerating pressure support  -continue vent settings  - Stable gas this morning    Vent Mode: PRVC  FiO2 (%): 30 %  S RR: 15  S VT: 320 mL  PEEP: 5 cm H20  PR SUP: 12 cm H20       Cardiovascular   Carotid Artery Injury (resolved)  At [facility], CVC placed intended for RIJ on 10/24 following intubation, PEA, ROSC. Was used with known blood return. Unclear if it was used for pressor support, but highly likely that it was. CXR for placement check showed concern for intra aterial location, and blood gas confirmed it was arterial. Thought to be in carotid artery. CVC removed with pressure held for 15 minutes, with no hematoma formation. On arrival, no physical exam  and bedside ultrasound showed no large hematoma. No active bleeding.   - Vascular Surgery consulted, stated fistula has healed based on Ultrasound findings    PEA arrest s/p ROSC  Hypovolemic/Hemorrhagic shock (resolved)  Atrial fibrillation  known left atrial appendage clot  HFpEF  Patient with PEA arrest peri-intubation with ROSC. Shock thought to be hemorrhagic in the setting of GI bleed History of atrial fibrillation with known LAA clot and HFpEF. Vasopressors were weaned off 10/27. APTT was elevated the morning of 10/28 necessitating changing anticoagulation from therapeutic heparin to therapeutic Lovenox .   - Continue metoprolol  -Heart rate stable  - Heparin gtt    Renal   Abnormal electrolytes (hypernatremia)  At [facility], had persistently hypernatremia near 153. Had been treated with D5 gtt. Sodium throughout admission has largely been 146-148. Sodium has overall normalized and continues to be within normal limits.  - Strict I/O's  - BMP daily  - Potassium and phosphate repleted overnight  - Stable hypernatremia    Infectious Disease/Autoimmune   E. Coli Bacteremia (resolved),   Initially admitted for afib, found to have E. coli bacteremia on October 10. This was treated with cefazolin . Rapid response called 10/24, patient noted to be hypothermic. WBCs jumped from 3.0 to 9.7 on 10/24. Although the most recent lab was following intubation and CPR. UA with pyuria, rare bacteria, hematuria. Peri-intubation, patient developed significant hypotension requiring initiation of NE and vasopressin. Suspect possible septic shock with unknown infectious source. Patient started on vancomycin and cefepime 10/24. CT Abdomen pelvis concerning for aspiration pneumonia. Following infectious workup was unremarkable. Antibiotics were stopped on 10/27.  Infectious workup was repeated on 11/3 due to reintubation and status post bronchoscopy.  Completed course of ceftriaxone.  -CTM    Cultures:  Blood Culture, Routine (no units)   Date Value   04/17/2024 No Growth at 48 hours   04/17/2024 No Growth at 48 hours     Urine Culture, Comprehensive (no units)   Date Value   04/17/2024 NO GROWTH   04/09/2024 NO GROWTH     Lower Respiratory Culture (no units)   Date Value   04/10/2024 2+ Methicillin-Susceptible Staphylococcus aureus (A)   04/10/2024 1+ Oropharyngeal Flora Isolated     WBC (10*9/L)   Date Value   04/20/2024 5.8     WBC, UA (/HPF)   Date Value   04/17/2024 1       FEN/GI   Sigmoid Mass c/f Malignancy and Hematochezia (resolved)  Hemorrhagic Shock (resolved)  CT abdomen pelvis done 10/24 with evidence of cirrhosis and masslike thickening of the lower sigmoid colon concerning for malignancy.  CTA abdomen pelvis performed 1024 with active extravasation into the gastric fundus possibly secondary to traumatic enteric tube placement.  GI scoped the patient on 10/25 and clipped an area of active bleeding.  They removed a total of 1.5 L of blood.  Hemoglobin 10/20 6 AM downtrended to 6.0, so 2 units of blood transfused.  GI stated this was consistent with 10/25 scope and likely did not represent increased bleeding. Hb steadily improving with Hb of 9.2 on 10/28.   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 Given shock of unclear etiology. CTAP demonstrating cirrhosis. Likely that cirrhosis is contributing to hypotension.   - Continue with lactulose  30 g  every 2 hours, hold parameters: Greater than 2 L  - Goal stool output 1500 mL daily  - NGT in place; TF and FWF  - Daily MELD labs and CMP  -3 L of stool output yesterday     MELD 3.0: 15 at 04/05/2024  4:31 AM  MELD-Na: 13 at 04/05/2024  4:31 AM  Calculated from:  Serum Creatinine: 0.43 mg/dL (Using min of 1 mg/dL) at 88/09/7972  5:68 AM  Serum Sodium: 143 mmol/L (Using max of 137 mmol/L) at 04/05/2024  4:31 AM  Total Bilirubin: 1.3 mg/dL at 88/09/7972  5:68 AM  Serum Albumin: 2.4 g/dL at 88/09/7972  5:68 AM  INR(ratio): 1.61 at 04/03/2024 10:26 AM  Age at listing (hypothetical): 78 years  Sex: Female at 04/05/2024  4:31 AM    Provider Malnutrition Assessment:  Body mass index is 40.99 kg/m??. BMI Interpretation: >/= 30 and < 40, consistent with obesity, clinically significant requiring additional resources and complicating multiple aspects of patient care.  GLIM criteria:   Pt does not meet criteria  -I have screened this patient for malnutrition and they did NOT meet criteria for malnutrition based on GLIM criteria.  -TF and FWF; Dietician consulted, appreciate assistance  Heme/Coag   Acute blood loss anemia  Hemoglobin down trended to 6.0 on the morning of 10/26 thought to be secondary to acute GI bleed that was scoped and clipped 10/25.  H/H stable.  -CBC daily  - Maintain active T&S    Endocrine   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)   - home anastrozole     Integumentary   NAI   #  - WOCN consulted for high risk skin assessment No. Reason: Not indicated.    Prophylaxis/LDA/Restraints/Consults   ICU Checklist completed: yes (see ICU rounding navigator in Epic)    Patient Lines/Drains/Airways Status       Active Active Lines, Drains, & Airways       Name Placement date Placement time Site Days    Tracheostomy Shiley 6 Cuffed 04/15/24  1133  6  4    NG/OG Tube Feedings 10 Fr. Right nostril 04/01/24  1138  Right nostril  18    External Urinary Device 04/13/24 With Suction 04/13/24  1700  -- 6    PICC Double Lumen 04/06/24 Left Basilic 04/06/24  1558  Basilic  13                  Patient Lines/Drains/Airways Status       Active Wounds       Name Placement date Placement time Site Days    Wound 03/01/24 Other (Comment) Toe (Comment which one) Left;Other (Comment) Blister present on arrival, tx already in outpatient setting, in process of healing, surrounding erythema 03/01/24  0129  Toe (Comment which one)  50    Wound 03/13/24 Irritant Contact Dermatitis Incontinence Sacrum Mid gluteal cleft MASD? 03/13/24  --  Sacrum  38    Wound 04/15/24 Surgical Neck tracheostomy secured with sutures and trach collar ( surgicel applied 04/15/24  1144  Neck  4                  Goals of Care     Code Status:   Orders Placed This Encounter   Procedures    Full Code     Standing Status:   Standing     Number of Occurrences:   1  Designated Chiropodist Maker:  Ms. Kimbrell designated healthcare decision maker(s) is/are   HCDM (patient stated preference): Toral,[name] - Spouse - [phone]    HCDM (patient stated preference): Geffert,[name] - Daughter - [phone]. See HCDM section of Epic sidebar/storyboard or ACP tab in patient chart for details regarding active HCDMs and patient capacity for decision-making.      Subjective     Patient intubated.  Withdraws to pain.  Does not follow commands.    Objective     Vitals - past 24 hours  Temp:  [37.1 ??C (98.7 ??F)-37.5 ??C (99.5 ??F)] 37.1 ??C (98.7 ??F)  Pulse:  [88-128] 98  SpO2 Pulse:  [92-127] 98  Resp:  [0-37] 18  BP: (129-180)/(54-88) 138/63  FiO2 (%):  [30 %] 30 %  SpO2:  [98 %-100 %] 99 % Intake/Output  I/O last 3 completed shifts:  In: 5352.8 [P.O.:60; I.V.:412.8; NG/GT:4680; IV Piggyback:200]  Out: 4600 [Urine:500; Stool:4100]     Physical Exam:    General: intubated on my examination. Trach in place. not following commands  HEENT: normocephalic, atraumatic   CV: pulses palpable, regular borderline tachycardia   Pulm: Rhonchi w transmitted upper airway sounds throughout  GI: soft, NTND, + BS  MSK: Bilateral LE pitting edema  Skin: Moist skin breakdown around trach.  Neuro: Withdrawals to painful stimuli.  Not following commands.  Reflexes intact    Continuous Infusions:   Infusions Meds[1]    Scheduled Medications:   Scheduled Medications[2]    PRN medications:  PRN Medications[3]    Data/Imaging Review: Reviewed in Epic and personally interpreted on 04/20/2024. See EMR for detailed results.    Waddell Mead, DO  PGY-1 Emergency Medicine             [1]    heparin  11 Units/kg/hr (04/20/24 0308)    NORepinephrine  bitartrate-NS Stopped (04/19/24 0050)   [2]    cyanocobalamin  (vitamin B-12)  1,000 mcg Enteral tube: gastric Daily    esomeprazole   40 mg Enteral tube: gastric Daily    famotidine   20 mg Enteral tube: gastric BID    flu vac 2025 [id number](PF)  0.5 mL Intramuscular During hospitalization    folic acid   1 mg Enteral tube: gastric Daily    lacosamide   100 mg Intravenous BID    lactulose   30 g Enteral tube: gastric Q2H    levETIRAcetam   1,000 mg Intravenous Q12H SCH    melatonin  3 mg Enteral tube: gastric QPM    metoPROLOL  tartrate  25 mg Enteral tube: gastric BID    multivitamins (ADULT)  1 tablet Enteral tube: gastric Daily    potassium phosphate   15 mmol Intravenous Once    rifAXIMin   550 mg Enteral tube: gastric BID    sodium chloride   10 mL Intravenous Q8H    sodium chloride   10 mL Intravenous Q8H    thiamine  mononitrate (vit B1)  100 mg Enteral tube: gastric Daily   [3] acetaminophen , heparin  (porcine)

## 2024-04-20 NOTE — Plan of Care (Signed)
 Problem: Skin Injury Risk Increased  Goal: Skin Health and Integrity  Outcome: Shift Focus  Intervention: Optimize Skin Protection  Recent Flowsheet Documentation  Taken 04/20/2024 0600 by Harden Buzzard, Wright RAMAN, RN  Pressure Reduction Techniques: frequent weight shift encouraged  Head of Bed [facility]) Positioning: HOB at 30-45 degrees  Taken 04/20/2024 0400 by Harden Buzzard, Wright RAMAN, RN  Pressure Reduction Techniques: frequent weight shift encouraged  Head of Bed [facility]) Positioning: HOB at 30-45 degrees  Taken 04/20/2024 0200 by Harden Buzzard, Wright RAMAN, RN  Pressure Reduction Techniques: frequent weight shift encouraged  Head of Bed [facility]) Positioning: HOB at 30-45 degrees  Taken 04/20/2024 0000 by Harden Buzzard Wright RAMAN, RN  Pressure Reduction Techniques: frequent weight shift encouraged  Head of Bed [facility]) Positioning: HOB at 30-45 degrees  Taken 04/19/2024 2200 by Harden Buzzard, Wright RAMAN, RN  Pressure Reduction Techniques: frequent weight shift encouraged  Head of Bed [facility]) Positioning: HOB at 30-45 degrees  Taken 04/19/2024 2000 by Harden Buzzard, [place] S, RN  Pressure Reduction Techniques: frequent weight shift encouraged  Head of Bed (HOB) Positioning: HOB at 30-45 degrees     Problem: Adult Inpatient Plan of Care  Goal: Absence of Hospital-Acquired Illness or Injury  Outcome: Shift Focus  Intervention: Identify and Manage Fall Risk  Recent Flowsheet Documentation  Taken 04/20/2024 0600 by Harden Buzzard Wright RAMAN, RN  Safety Interventions: aspiration precautions  Taken 04/20/2024 0400 by Harden Buzzard Wright RAMAN, RN  Safety Interventions: aspiration precautions  Taken 04/20/2024 0200 by Harden Buzzard Wright RAMAN, RN  Safety Interventions: aspiration precautions  Taken 04/20/2024 0000 by Harden Buzzard Wright RAMAN, RN  Safety Interventions: aspiration precautions  Taken 04/19/2024 2200 by Harden Buzzard Wright RAMAN, RN  Safety Interventions: aspiration precautions  Taken 04/19/2024 2000 by Harden Buzzard Wright RAMAN, RN  Safety Interventions: aspiration precautions  Intervention: Prevent Skin Injury  Recent Flowsheet Documentation  Taken 04/20/2024 0600 by Harden Buzzard Wright RAMAN, RN  Positioning for Skin: Right  Taken 04/20/2024 0400 by Harden Buzzard Wright RAMAN, RN  Positioning for Skin: Left  Taken 04/20/2024 0200 by Harden Buzzard Wright RAMAN, RN  Positioning for Skin: Right  Taken 04/20/2024 0000 by Harden Buzzard Wright RAMAN, RN  Positioning for Skin: Left  Taken 04/19/2024 2200 by Harden Buzzard Wright RAMAN, RN  Positioning for Skin: Right  Taken 04/19/2024 2000 by Harden Buzzard Wright RAMAN, RN  Positioning for Skin: Left  Intervention: Prevent and Manage VTE (Venous Thromboembolism) Risk  Recent Flowsheet Documentation  Taken 04/20/2024 0400 by Harden Buzzard Wright RAMAN, RN  Anti-Embolism Device Status: Other (Comment)  Taken 04/20/2024 0000 by Harden Buzzard Wright RAMAN, RN  Anti-Embolism Device Status: Other (Comment)     Problem: Self-Care Deficit  Goal: Improved Ability to Complete Activities of Daily Living  Outcome: Shift Focus     Problem: Breathing Pattern Ineffective  Goal: Effective Breathing Pattern  Outcome: Shift Focus  Intervention: Promote Improved Breathing Pattern  Recent Flowsheet Documentation  Taken 04/20/2024 0600 by Harden Buzzard, Wright RAMAN, RN  Head of Bed [facility]) Positioning: HOB at 30-45 degrees  Taken 04/20/2024 0400 by Harden Buzzard, [place] S, RN  Head of Bed [facility]) Positioning: HOB at 30-45 degrees  Airway/Ventilation Management:   airway patency maintained   pulmonary hygiene promoted  Taken 04/20/2024 0200 by Harden Buzzard, Wright RAMAN, RN  Head of Bed [facility]) Positioning: HOB at 30-45 degrees  Taken 04/20/2024 0000 by Harden Buzzard, Wright RAMAN, RN  Head of Bed [facility]) Positioning: HOB at 30-45 degrees  Airway/Ventilation Management:   airway patency maintained   pulmonary hygiene promoted  Taken 04/19/2024 2200 by Harden Buzzard, [place] S, RN  Head of Bed [facility]) Positioning: HOB at 30-45 degrees  Taken 04/19/2024 2000 by Harden Buzzard, [place] S, RN  Head of Bed [facility]) Positioning: HOB at 30-45 degrees  Airway/Ventilation Management:   airway patency maintained   pulmonary hygiene promoted     Problem: Gas Exchange Impaired  Goal: Optimal Gas Exchange  Outcome: Shift Focus  Intervention: Optimize Oxygenation and Ventilation  Recent Flowsheet Documentation  Taken 04/20/2024 0600 by Harden Buzzard, Wright RAMAN, RN  Head of Bed [facility]) Positioning: HOB at 30-45 degrees  Taken 04/20/2024 0400 by Harden Buzzard, Wright RAMAN, RN  Head of Bed [facility]) Positioning: HOB at 30-45 degrees  Airway/Ventilation Management:   airway patency maintained   pulmonary hygiene promoted  Taken 04/20/2024 0200 by Harden Buzzard, Wright RAMAN, RN  Head of Bed [facility]) Positioning: HOB at 30-45 degrees  Taken 04/20/2024 0000 by Harden Buzzard Wright RAMAN, RN  Head of Bed [facility]) Positioning: HOB at 30-45 degrees  Airway/Ventilation Management:   airway patency maintained   pulmonary hygiene promoted  Taken 04/19/2024 2200 by Harden Buzzard, [place] S, RN  Head of Bed [facility]) Positioning: HOB at 30-45 degrees  Taken 04/19/2024 2000 by Harden Buzzard, [place] S, RN  Head of Bed [facility]) Positioning: HOB at 30-45 degrees  Airway/Ventilation Management:   airway patency maintained   pulmonary hygiene promoted   Shift Summary  Potassium phosphate IVPB was administered at 6:09 AM in response to laboratory findings.  Laboratory results during the shift revealed anemia, mild hypernatremia, and hypoalbuminemia, with additional findings of abnormal aPTT and decreased eGFR by cystatin C.  Ventilator support and airway management were maintained, with frequent suctioning and consistent SpO2 levels on 30% FiO2.  Frequent repositioning, pressure reduction, and perineal care were provided to support skin integrity and prevent complications.  Remained dependent for activities of daily living and oral care throughout the shift.  Overall, remained on ventilator support with stable oxygenation and ongoing interventions for skin and safety needs.    Skin Health and Integrity: Skin remained weeping and bruised throughout the shift, with frequent repositioning and pressure reduction techniques implemented to support skin integrity.    Absence of Hospital-Acquired Illness or Injury: No new hospital-acquired injuries were documented, and aspiration precautions, perineal care, and fall prevention interventions were consistently maintained during the shift.    Improved Ability to Complete Activities of Daily Living: Remained dependent for oral care and required assistance for all hygiene needs during the shift.    Effective Breathing Pattern: Respiratory rate fluctuated significantly, with periods of both low and elevated rates, but ventilator support and airway management interventions were maintained; respiratory pattern alternated between ventilator controlled and unlabored.    Optimal Gas Exchange: SpO2 levels were stable and within normal range for most of the shift while on 30% FiO2 and ventilator support, with only brief decreases to 96-97%.

## 2024-04-20 NOTE — Progress Notes (Signed)
 ICU TRANSPORT NOTE    Destination: MRI    Departing Unit: MICU  Pickup Time: 2220    Return Unit: MICU  Return Time: 2340    [facility] patient ID band verified  Allergies Reviewed  Code Status at time of transport: Full    Report received from primary nurse via [employer]. Handoff performed of continuous drip/infusion Patient transported via stretcher under ICU level of care. See vital signs during transport via [employer]. Shiley trache- O2 via Ventilator @ 30 %, managed by RT. Patient is unable to follow commands. Patient tolerated procedure/scan well. Universal precautions maintained throughout transport.    Update and care given to primary nurse. See Doc Flowsheets/MAR for additional transportation documentation. Proper body mechanics and safe patient handling equipment were utilized throughout transport.

## 2024-04-20 NOTE — Plan of Care (Signed)
 Patient rested comfortably overnight on PRVC settings. PSV trials are being performed during the daytime. Airway is patent and secure. Trach care completed with emergency equipment at bedside. Will continue to monitor.    Problem: Mechanical Ventilation Invasive  Goal: Optimal Device Function  Outcome: Ongoing - Unchanged  Intervention: Optimize Device Care and Function  Recent Flowsheet Documentation  Taken 04/19/2024 2037 by Jullie Birmingham, RRT  Oral Care:   mouth swabbed   suction provided   oral rinse provided   teeth brushed   tongue brushed     Problem: Artificial Airway  Goal: Optimal Device Function  Outcome: Ongoing - Unchanged  Intervention: Optimize Device Care and Function  Recent Flowsheet Documentation  Taken 04/19/2024 2037 by Jullie Birmingham, RRT  Oral Care:   mouth swabbed   suction provided   oral rinse provided   teeth brushed   tongue brushed

## 2024-04-20 NOTE — Progress Notes (Signed)
 OTOLARYNGOLOGY HEAD & NECK SURGERY- PROGRESS NOTE    Assessment/Plan:  78 year old F with history of HFpEF, HTN, Afib on AC, MASLD cirrhosis, originally hospitalized for scheduled TEE/DCCV complicated by chronic respiratory failure requiring prolonged intubation. Now s/p tracheostomy 11/14. 5 Days Post-Op     - Surgicel removed 11/16  - Keep obturator at head of bed. Please keep extra 6 cuffed, 4 cuffed, 6 cuffless, and 4 cuffless tracheostomy tubes at bedside.  - Suction trach as ordered.  - Please keep mepilex under inferior flange of trach to prevent wound breakdown from pressure injury.   - Trach secured with soft collar. Please do not cut trach ties.  - ENT performed first trach change today 11/19. Remainder of trach changes per RT.  - Wean from ventilator per primary team. Please involve the trach team and respiratory therapy in tracheostomy care.   - Please teach patient and family trach care when appropriate.  - Please page ENT with questions or concerns.       Principal Problem:    Bacteremia, escherichia coli  Active Problems:    Alcoholic liver disease (HHS-HCC)    HTN (hypertension)    Depression with anxiety    Class 2 severe obesity with serious comorbidity in adult    Atrial fibrillation    (CMS-HCC)    Pleural effusion    Hypernatremia    Thrombocytopenia    Cirrhosis    (CMS-HCC)    A-fib (CMS-HCC)    Hepatic encephalopathy    (CMS-HCC)    Anaphylaxis       LOS: 41 days       Subjective:  NAEO, AF, HDS on vent. Currently on 30%/5L. No desats.  No bleeding from the trach or stoma.     Objective:     Vital signs in last 24 hours:  Temp:  [36.5 ??C (97.7 ??F)-37.2 ??C (98.9 ??F)] 37.2 ??C (98.9 ??F)  Pulse:  [88-125] 105  SpO2 Pulse:  [88-126] 105  Resp:  [0-37] 18  BP: (124-179)/(48-87) 145/75  MAP (mmHg):  [76-115] 96  FiO2 (%):  [30 %] 30 %  SpO2:  [96 %-100 %] 98 %    Intake/Output last 24 hours:  I/O last 3 completed shifts:  In: 4382.8 [P.O.:60; I.V.:412.8; NG/GT:3910]  Out: 5800 [Urine:350; Stool:5450]    Physical Exam  General: Sedated, laying supine, chronically ill-appearing, morbidly obese  Pulm: Mechanically ventilated   Neck: 6 cuffed Shiley tracheostomy tube in place, secured with soft collar.    Procedure Note: Tracheotomy Tube Change  Indications: Respiratory failure status s/p tracheostomy  Pre-operative Diagnosis: Respiratory failure  Post-operative Diagnosis: Same  Anesthesia: None    Procedure Details:  With the patient in the supine position, the 6.0 Shiley cuffed double cannula tracheotomy tube was removed. The new 6.0 Shiley cuffed double cannula tracheotomy tube was placed. A flexible suction catheter was passed through the tube into the airway. The patient tolerated the procedure well and there were no complications.     Findings: 6 cuffed trach placed without difficulty

## 2024-04-21 LAB — BLOOD GAS, VENOUS
BASE EXCESS VENOUS: 1.4 (ref -2.0–2.0)
BASE EXCESS VENOUS: 0 (ref -2.0–2.0)
CARBOXYHEMOGLOBIN, VENOUS: 1.6 % — ABNORMAL HIGH (ref <1.2)
CARBOXYHEMOGLOBIN, VENOUS: 1.8 % — ABNORMAL HIGH (ref <1.2)
HCO3 VENOUS: 26 mmol/L (ref 22–27)
HCO3 VENOUS: 27 mmol/L (ref 22–27)
METHEMOGLOBIN, VENOUS: <1 % (ref <1.5)
METHEMOGLOBIN, VENOUS: <1 % (ref <1.5)
O2 SATURATION VENOUS: 62.4 % (ref 40.0–85.0)
O2 SATURATION VENOUS: 79.5 % (ref 40.0–85.0)
OXYHEMOGLOBIN, VENOUS: 61.2 % (ref 40.0–85.0)
OXYHEMOGLOBIN, VENOUS: 77.6 % (ref 40.0–85.0)
PCO2 VENOUS: 45 mmHg (ref 40–60)
PCO2 VENOUS: 63 mmHg — ABNORMAL HIGH (ref 40–60)
PH VENOUS: 7.38 (ref 7.32–7.43)
PH VENOUS: 7.25 — ABNORMAL LOW (ref 7.32–7.43)
PO2 VENOUS: 39 mmHg (ref 30–55)
PO2 VENOUS: 57 mmHg — ABNORMAL HIGH (ref 30–55)

## 2024-04-21 LAB — CBC W/ AUTO DIFF
BASOPHILS ABSOLUTE COUNT: 0 10*9/L (ref 0.0–0.1)
BASOPHILS RELATIVE PERCENT: 0.9 %
EOSINOPHILS ABSOLUTE COUNT: 0.1 10*9/L (ref 0.0–0.5)
EOSINOPHILS RELATIVE PERCENT: 2.6 %
HEMATOCRIT: 26.9 % — ABNORMAL LOW (ref 34.0–44.0)
HEMOGLOBIN: 8.7 g/dL — ABNORMAL LOW (ref 11.3–14.9)
LYMPHOCYTES ABSOLUTE COUNT: 0.8 10*9/L — ABNORMAL LOW (ref 1.1–3.6)
LYMPHOCYTES RELATIVE PERCENT: 17.2 %
MEAN CORPUSCULAR HEMOGLOBIN CONC: 32.3 g/dL (ref 32.0–36.0)
MEAN CORPUSCULAR HEMOGLOBIN: 32.2 pg (ref 25.9–32.4)
MEAN CORPUSCULAR VOLUME: 99.6 fL — ABNORMAL HIGH (ref 77.6–95.7)
MEAN PLATELET VOLUME: 9.7 fL (ref 6.8–10.7)
MONOCYTES ABSOLUTE COUNT: 0.5 10*9/L (ref 0.3–0.8)
MONOCYTES RELATIVE PERCENT: 10.9 %
NEUTROPHILS ABSOLUTE COUNT: 3.2 10*9/L (ref 1.8–7.8)
NEUTROPHILS RELATIVE PERCENT: 68.4 %
PLATELET COUNT: 159 10*9/L (ref 150–450)
RED BLOOD CELL COUNT: 2.7 10*12/L — ABNORMAL LOW (ref 3.95–5.13)
RED CELL DISTRIBUTION WIDTH: 19.6 % — ABNORMAL HIGH (ref 12.2–15.2)
WBC ADJUSTED: 4.6 10*9/L (ref 3.6–11.2)

## 2024-04-21 LAB — CORTISOL: CORTISOL TOTAL: 19.5 ug/dL (ref >=1.5)

## 2024-04-21 LAB — HEPATIC FUNCTION PANEL
ALKALINE PHOSPHATASE: 154 U/L — ABNORMAL HIGH (ref 46–116)
BILIRUBIN DIRECT: 0.4 mg/dL — ABNORMAL HIGH (ref 0.00–0.30)

## 2024-04-21 LAB — CHLORIDE: CHLORIDE: 113 mmol/L — ABNORMAL HIGH (ref 98–107)

## 2024-04-21 LAB — POTASSIUM: POTASSIUM: 4 mmol/L (ref 3.4–4.8)

## 2024-04-21 LAB — TSH: THYROID STIMULATING HORMONE: 4.758 u[IU]/mL (ref 0.550–4.780)

## 2024-04-21 LAB — BASIC METABOLIC PANEL
CALCIUM: 8.4 mg/dL — ABNORMAL LOW (ref 8.7–10.4)
CO2: 24 mmol/L (ref 20.0–31.0)
GLUCOSE RANDOM: 178 mg/dL (ref 70–179)

## 2024-04-21 LAB — COPPER, SERUM: COPPER: 68 ug/dL — ABNORMAL LOW

## 2024-04-21 LAB — T3, FREE: T3 FREE: 2.41 pg/mL (ref 2.30–4.20)

## 2024-04-21 LAB — TRYPTASE: TRYPTASE LEVEL: 5 ng/mL

## 2024-04-21 LAB — SODIUM: SODIUM: 148 mmol/L — ABNORMAL HIGH (ref 135–145)

## 2024-04-21 LAB — PHOSPHORUS: PHOSPHORUS: 2.8 mg/dL (ref 2.4–5.1)

## 2024-04-21 LAB — CREATININE
CREATININE: 0.35 mg/dL — ABNORMAL LOW (ref 0.55–1.02)
EGFR CKD-EPI (2021) FEMALE: >90 mL/min/1.73m2 (ref >=60)

## 2024-04-21 LAB — T4, FREE: FREE T4: 0.84 ng/dL — ABNORMAL LOW (ref 0.89–1.76)

## 2024-04-21 LAB — PROTEIN, TOTAL: PROTEIN TOTAL: 5.6 g/dL — ABNORMAL LOW (ref 5.7–8.2)

## 2024-04-21 LAB — ZINC: ZINC: 36 ug/dL — ABNORMAL LOW

## 2024-04-21 LAB — ALBUMIN: ALBUMIN: 2.5 g/dL — ABNORMAL LOW (ref 3.4–5.0)

## 2024-04-21 LAB — ALT: ALT (SGPT): 16 U/L (ref 10–49)

## 2024-04-21 LAB — AST: AST (SGOT): 51 U/L — ABNORMAL HIGH (ref <=34)

## 2024-04-21 LAB — APTT
APTT: 61.2 s — ABNORMAL HIGH (ref 24.8–38.4)
HEPARIN CORRELATION: 0.3

## 2024-04-21 LAB — BILIRUBIN, TOTAL: BILIRUBIN TOTAL: 0.8 mg/dL (ref 0.3–1.2)

## 2024-04-21 LAB — BUN: BLOOD UREA NITROGEN: 19 mg/dL (ref 9–23)

## 2024-04-21 LAB — CYSTATIN C
CYSTATIN C: 1.59 mg/L — ABNORMAL HIGH (ref 0.64–1.23)
EGFR CKD-EPI (2012) CYSTATIN C FEMALE: 37 mL/min/1.73m2 — ABNORMAL LOW (ref >=60)

## 2024-04-21 LAB — MAGNESIUM: MAGNESIUM: 2.1 mg/dL (ref 1.6–2.6)

## 2024-04-21 MED ADMIN — sodium chloride (NS) 0.9 % flush 10 mL: 10 mL | INTRAVENOUS | @ 15:00:00

## 2024-04-21 MED ADMIN — sodium chloride (NS) 0.9 % flush 10 mL: 10 mL | INTRAVENOUS | @ 07:00:00

## 2024-04-21 MED ADMIN — sodium chloride (NS) 0.9 % flush 10 mL: 10 mL | INTRAVENOUS | @ 23:00:00

## 2024-04-21 MED ADMIN — rifAXIMin (XIFAXAN) oral suspension: 550 mg | GASTROENTERAL | @ 13:00:00 | Stop: 2025-04-11

## 2024-04-21 MED ADMIN — rifAXIMin (XIFAXAN) oral suspension: 550 mg | GASTROENTERAL | @ 01:00:00 | Stop: 2025-04-11

## 2024-04-21 MED ADMIN — lactulose oral solution: 30 g | GASTROENTERAL | @ 13:00:00 | Stop: 2024-04-21

## 2024-04-21 MED ADMIN — lactulose oral solution: 30 g | GASTROENTERAL | @ 11:00:00 | Stop: 2024-04-21

## 2024-04-21 MED ADMIN — lactulose oral solution: 30 g | GASTROENTERAL | @ 07:00:00 | Stop: 2024-04-21

## 2024-04-21 MED ADMIN — lactulose oral solution: 30 g | GASTROENTERAL | @ 01:00:00

## 2024-04-21 MED ADMIN — folic acid (FOLVITE) tablet 1 mg: 1 mg | GASTROENTERAL | @ 13:00:00

## 2024-04-21 MED ADMIN — metoPROLOL tartrate (Lopressor) tablet 25 mg: 25 mg | GASTROENTERAL | @ 03:00:00

## 2024-04-21 MED ADMIN — metoPROLOL tartrate (Lopressor) tablet 25 mg: 25 mg | GASTROENTERAL | @ 13:00:00

## 2024-04-21 MED ADMIN — lactulose oral solution: 30 g | GASTROENTERAL | @ 18:00:00

## 2024-04-21 MED ADMIN — lactulose oral solution: 30 g | GASTROENTERAL | @ 22:00:00

## 2024-04-21 MED ADMIN — thiamine mononitrate (vit B1) tablet 100 mg: 100 mg | GASTROENTERAL | @ 13:00:00

## 2024-04-21 MED ADMIN — ergocalciferol (DRISDOL) oral drops 200 mcg/mL (8,000 unit/mL): 100 ug | GASTROENTERAL | @ 17:00:00 | Stop: 2024-05-21

## 2024-04-21 MED ADMIN — cyanocobalamin (vitamin B-12) tablet 1,000 mcg: 1000 ug | GASTROENTERAL | @ 13:00:00

## 2024-04-21 MED ADMIN — lacosamide (VIMPAT) injection 100 mg: 100 mg | INTRAVENOUS | @ 14:00:00

## 2024-04-21 MED ADMIN — lacosamide (VIMPAT) injection 100 mg: 100 mg | INTRAVENOUS | @ 03:00:00

## 2024-04-21 MED ADMIN — famotidine (PEPCID) tablet 20 mg: 20 mg | GASTROENTERAL | @ 13:00:00

## 2024-04-21 MED ADMIN — famotidine (PEPCID) tablet 20 mg: 20 mg | GASTROENTERAL | @ 01:00:00

## 2024-04-21 MED ADMIN — melatonin tablet 3 mg: 3 mg | GASTROENTERAL | @ 22:00:00

## 2024-04-21 MED ADMIN — esomeprazole (NEXIUM) granules 40 mg: 40 mg | GASTROENTERAL | @ 13:00:00

## 2024-04-21 MED ADMIN — gadopiclenol (ELUCIREM,VUEWAY) injection 10 mL: 10 mL | INTRAVENOUS | @ 04:00:00 | Stop: 2024-04-20

## 2024-04-21 MED ADMIN — multivitamins, therapeutic with minerals tablet 1 tablet: 1 | GASTROENTERAL | @ 13:00:00

## 2024-04-21 MED ADMIN — levETIRAcetam (KEPPRA) injection 1,000 mg: 1000 mg | INTRAVENOUS | @ 14:00:00

## 2024-04-21 MED ADMIN — levETIRAcetam (KEPPRA) injection 1,000 mg: 1000 mg | INTRAVENOUS | @ 03:00:00

## 2024-04-21 MED ADMIN — alteplase (ACTIVase) injection small catheter clearance 1 mg: 1 mg | INTRAVENOUS | @ 11:00:00 | Stop: 2024-04-21

## 2024-04-21 MED ADMIN — heparin 25,000 Units/250 mL (100 units/mL) in 0.45% saline infusion (premade): 0-24 [IU]/kg/h | INTRAVENOUS | @ 19:00:00

## 2024-04-21 NOTE — Plan of Care (Signed)
 Patient continued on PRVC to rest overnight. Patient transported to MRI and back successfully. Airway is patent and secure. Trach care completed with emergency equipment at bedside. Will continue to monitor.     Problem: Mechanical Ventilation Invasive  Goal: Optimal Device Function  Outcome: Ongoing - Unchanged     Problem: Artificial Airway  Goal: Optimal Device Function  Outcome: Ongoing - Unchanged

## 2024-04-21 NOTE — Procedures (Signed)
 The Venous Access Team (VAT) received a request to assess PICC.   LITTIE Pop RN assessed patient at bedside.  Appropriate PPE were utilized. PICC now with blood return post alteplase. New chest x ray shows PICC mid svc per radiology read. Spoke with care nurse who states PICC with good blood return presently. Team notified via secure chat of current x ray read.        PIV Workup / Procedure Time:  30 minutes     RN was notified.     Thank you,     Alm KANDICE Kaufmann, RN Venous Access Team

## 2024-04-21 NOTE — Procedures (Signed)
 PICC Troubleshooting Note    VAT requested to assess sluggish line. PICC lost blood return overnight after dressing change yesterday. Alteplase instilled per primary RN but still unable to get blood return.    VAT to bedside to assess line. Blood return noted but sluggish. PICC power flushed.   Dr Malka messaged and new chest xray ordered to include left arm in image.    Rea LOISE Pop, RN       Workup Time:  30 minutes

## 2024-04-21 NOTE — Plan of Care (Signed)
 Patient placed on PSV-CPAP at the beginning of shift. Tolerated without any adverse affects. Mid shift placed patient on HFTC, she tolerated a hour and a half. Began to have increased WOB and gas noted increase in PCO2. Placed back on ventilator to rest. Gas normalized and remained on ventilator for remainder of shift. All emergency equipment bedside. Will continue to monitor.    Problem: Mechanical Ventilation Invasive  Goal: Effective Communication  04/21/2024 1831 by Heron Connors, RRT  Outcome: Ongoing - Unchanged  04/21/2024 1828 by Heron Connors, RRT  Outcome: Ongoing - Unchanged  Goal: Optimal Device Function  04/21/2024 1831 by Heron Connors, RRT  Outcome: Ongoing - Unchanged  04/21/2024 1828 by Heron Connors, RRT  Outcome: Ongoing - Unchanged  Intervention: Optimize Device Care and Function  Recent Flowsheet Documentation  Taken 04/21/2024 0815 by Heron Connors, RRT  Airway/Ventilation Management:   airway patency maintained   humidification applied   pulmonary hygiene promoted  Oral Care:   mouth swabbed   oral rinse provided   suction provided   teeth brushed   tongue brushed  Goal: Mechanical Ventilation Liberation  04/21/2024 1831 by Heron Connors, RRT  Outcome: Ongoing - Unchanged  04/21/2024 1828 by Heron Connors, RRT  Outcome: Ongoing - Unchanged  Goal: Optimal Nutrition Delivery  04/21/2024 1831 by Heron Connors, RRT  Outcome: Ongoing - Unchanged  04/21/2024 1828 by Heron Connors, RRT  Outcome: Ongoing - Unchanged  Goal: Absence of Device-Related Skin and Tissue Injury  04/21/2024 1831 by Heron Connors, RRT  Outcome: Ongoing - Unchanged  04/21/2024 1828 by Heron, Wendle Kina, RRT  Outcome: Ongoing - Unchanged  Goal: Absence of Ventilator-Induced Lung Injury  04/21/2024 1831 by Heron Connors, RRT  Outcome: Ongoing - Unchanged  04/21/2024 1828 by Heron, Shawnique Mariotti, RRT  Outcome: Ongoing - Unchanged  Intervention: Prevent Ventilator-Associated Pneumonia  Recent Flowsheet Documentation  Taken 04/21/2024 0815 by Heron, Derriona Branscom, RRT  Head of Bed [facility]) Positioning: HOB at 30-45 degrees  VAP Prevention Bundle:   HOB elevation maintained   oral care regularly provided   vent circuit breaks minimized  Oral Care:   mouth swabbed   oral rinse provided   suction provided   teeth brushed   tongue brushed     Problem: Artificial Airway  Goal: Optimal Device Function  Intervention: Optimize Device Care and Function  Recent Flowsheet Documentation  Taken 04/21/2024 0815 by Heron Connors, RRT  Airway/Ventilation Management:   airway patency maintained   humidification applied   pulmonary hygiene promoted  Oral Care:   mouth swabbed   oral rinse provided   suction provided   teeth brushed   tongue brushed

## 2024-04-21 NOTE — Progress Notes (Signed)
 Mode to rate for the night, pt has become tachypnic w/ rate 30s vt low 200's on psv.  She to [facility] rate very well.

## 2024-04-21 NOTE — Progress Notes (Signed)
 MICU Daily Progress Note     Date of Service: 04/21/2024    Problem List:   Principal Problem:    Bacteremia, escherichia coli  Active Problems:    Alcoholic liver disease (HHS-HCC)    HTN (hypertension)    Depression with anxiety    Class 2 severe obesity with serious comorbidity in adult    Atrial fibrillation    (CMS-HCC)    Pleural effusion    Hypernatremia    Thrombocytopenia    Cirrhosis    (CMS-HCC)    A-fib (CMS-HCC)    Hepatic encephalopathy    (CMS-HCC)    Anaphylaxis      Interval history: [name] is a 78 y.o. female with HFpEF, HTN, Afib on AC, MASLD cirrhosis, originally hospitalized for scheduled TEE/DCCV (aborted d/t LAA clot) c/b AHHRF, encephalopathy and septic shock requiring MICU care, was s/p extubation transferred to [facility] for further management of anticoagulation and hypernatremia. Transferred back to [facility] MICU for closer monitoring for respiratory status, pressor requirement, GI bleed, and management of carotid artery injury.     On 10/24, patient had acute worsening of respiratory status leading requiring intubation. When patient was given sedation for intubation, she coded and promptly had ROSC after a few minutes of CPR. Subsequently, CVC placed in neck, used to draw blood, unclear if given pressors through it, then found  to be in arterial system based on blood gas and imaging, likely right carotid. Line was subsequently removed, and pressure was held for 15 minutes, hemostasis with no hematoma. She was transferred to [facility] MICU for management of this carotid injury, to be evaluated by vascular surgery while also managing her increased vasopressor requirement and respiratory distress.  Has been weaned off pressors since 10/27 at 1 PM. VBG's indicating that she is ready for extubation, however her mentation suggests that she is not able to protect her airway.  She was extubated on 10/31, and mentation slowly improved.  Unfortunately on the morning of 11/2 she had increased lethargy, increased secretions, and she was not protecting her airway.  She was reintubated on 11/2, and required pressor support with norepinephrine and vasopressin after the induction agents.  Self extubated on 11/5. Re intubated on 11/6 due to hypercarbia and increased work of breathing.  We are left with the challenging options regarding the care of Ms. Minch.  Due to her multiple failed extubations, we are concerned Ms. Rembold will need a tracheostomy long-term to protect her airway.  There is a meeting with palliative care and family on 04/13/2024, her family agreed for tracheostomy.  Tracheostomy placed 11/14.  On 11/16, patient started presenting with altered mental status as she became hard to arouse and stopped following commands.  Sepsis workup was initiated.  She also had a lateral gaze on exam, so neurology was consulted.  EEG showed seizure activity and MRI brain showed diffuse cortical restricted diffusion greatest within the bilateral parietal -occipital regions.  Her ammonia level is also elevated to 291.  It is unclear if seizures are related to her ammonia level.  Patient placed on Keppra and Vimpat.    Plans for family meeting next week to discuss goals of care.  Palliative is involved.    24 hour events:   - mental status: attempting to open eyes   - decrease lactulose  to   - may need replacement PICC   - neuro following, stop EEG   - try high flow trach collar  Neurological   Hepatic encephalopathy - seizure-like activity  Per chart review at [facility], rapid response called on 10/24 for increased somnolence. At the time, differential included hepatic encephalopathy, toxic metabolic encephalopathy in the setting of shock (possibly sepsis), versus intracranial pathology. She was subsequently intubated. CT head on 10/24 showed no acute intracranial abnormality. Weaned off sedation with slow improvements in neurological status.  While we have continued lactulose  therapy and seeing good response with several bowel movements, her mentation remains poor.  This is concerning for severe ICU delirium.  Meeting was had with daughter, [name], on 11/5.  Described that should intubation be necessary we would likely be moving towards a tracheostomy as Janetta has continuously not been protecting her airway.  As of now [name] wanted to pursue aggressive measures including repeat intubation and potential tracheostomy. Tracheostomy placed 11/14.      Overnight on 04/18/2024, patient noted to have eye deviation along with decreased responsiveness on physical exam.  Head CT ordered and reassuring.  Neurology consulted.  EEG with evidence of seizure activity, therefore Keppra and vimpat started per neurology.  Titrating lactulose  for goal stool output.  Neurological status is slightly improved as she now opens her eyes to stimulation, however still does not follow commands.    - Neurology following, appreciate recommendations  - Decreasing lactulose  to 30 g every 4 hours, holding parameters: >2L output due to increased output overnight (over 4 L of stool)  - Rifaximin  550 mg twice daily  - MRI brain shows diffuse cortical restricted diffusion greatest within the bilateral parieto-occipital regions, unclear etiology  -Continue Keppra 1 g twice daily  - Continue Vimpat 100 mg twice daily  - Seizure activity has decreased on EEG, neurology states that she can come off the EEG.  Will keep antiseizure meds as is.  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 N/A, not on sedation  [place] Agitation Assessment Scale (RASS) : -3 (04/21/2024 12:00 PM)    Pulmonary   Acute Hypoxic Hypercarbic Respiratory Failure   Rapid response called on 10/24 for increased somnolence, found to have acute hypercarbic respiratory failure and subsequently intubated. Suspect hypercarbic respiratory failure due to encephalopathy as above. CXR on 10/24 showed worsening pleural effusions. Not currently getting diuresed. Given patient's hypoxia, and AMS, there was concern for infectious etiology. MRSA nares negative. Infectious workup thus far is unremarkable. SBTs show readiness extubation based on VBGs, however there is concern that she continues to be unable to protect her airway due to her current mentation.  Reintubated on 11/2 followed by therapeutic bronchoscopy.  Self extubated on 11/5.  Reintubate noted on 11/6 due to hypercarbia and not protecting her airway. Trach placed 11/14, complicated by anaphylactic reaction to rocuronium which was treated with epi, benadryl and steroids. Allergy added to chart.    - Continue mechanical ventilation, weaning as tolerated  -try high flow trach collar  -We have noticed some skin breakdown around her trach site, ENT is aware  - Stable gas this morning  -ENT performed first trach change today 11/19. Remainder of trach changes per RT.       Vent Mode: PRVC  FiO2 (%): 35 %  S RR: 15  S VT: 320 mL  PEEP: 5 cm H20  PR SUP: 10 cm H20  Cardiovascular   Carotid Artery Injury (resolved)  At [facility], CVC placed intended for RIJ on 10/24 following intubation, PEA, ROSC. Was used with known blood return. Unclear if it was used for pressor support, but highly likely that it was. CXR for placement check showed concern for intra aterial location, and blood gas confirmed it was arterial. Thought to be in carotid artery. CVC removed with pressure held for 15 minutes, with no hematoma formation. On arrival, no physical exam and bedside ultrasound showed no large hematoma. No active bleeding.   - Vascular Surgery consulted, stated fistula has healed based on Ultrasound findings    PEA arrest s/p ROSC  Hypovolemic/Hemorrhagic shock (resolved)  Atrial fibrillation  known left atrial appendage clot  HFpEF  Patient with PEA arrest peri-intubation with ROSC. Shock thought to be hemorrhagic in the setting of GI bleed History of atrial fibrillation with known LAA clot and HFpEF. Vasopressors were weaned off 10/27. APTT was elevated the morning of 10/28 necessitating changing anticoagulation from therapeutic heparin to therapeutic Lovenox .   -Continue metoprolol  -Heart rate stable  - Heparin gtt  -stable off pressors    Renal   Abnormal electrolytes (hypernatremia)  At [facility], had persistently hypernatremia near 153. Had been treated with D5 gtt. Sodium throughout admission has largely been 146-148. Sodium has overall normalized and continues to be within normal limits.  - Strict I/O's  - BMP daily  - Stable hypernatremia    Infectious Disease/Autoimmune   E. Coli Bacteremia (resolved),   Initially admitted for afib, found to have E. coli bacteremia on October 10. This was treated with cefazolin . Rapid response called 10/24, patient noted to be hypothermic. WBCs jumped from 3.0 to 9.7 on 10/24. Although the most recent lab was following intubation and CPR. UA with pyuria, rare bacteria, hematuria. Peri-intubation, patient developed significant hypotension requiring initiation of NE and vasopressin. Suspect possible septic shock with unknown infectious source. Patient started on vancomycin and cefepime 10/24. CT Abdomen pelvis concerning for aspiration pneumonia. Following infectious workup was unremarkable. Antibiotics were stopped on 10/27.  Infectious workup was repeated on 11/3 due to reintubation and status post bronchoscopy.  Completed course of ceftriaxone.  -CTM    Cultures:  Blood Culture, Routine (no units)   Date Value   04/17/2024 No Growth at 72 hours   04/17/2024 No Growth at 72 hours     Urine Culture, Comprehensive (no units)   Date Value   04/17/2024 NO GROWTH   04/09/2024 NO GROWTH     Lower Respiratory Culture (no units)   Date Value   04/10/2024 2+ Methicillin-Susceptible Staphylococcus aureus (A)   04/10/2024 1+ Oropharyngeal Flora Isolated     WBC (10*9/L)   Date Value   04/21/2024 4.6     WBC, UA (/HPF)   Date Value   04/17/2024 1       FEN/GI   Sigmoid Mass c/f Malignancy and Hematochezia (resolved)  Hemorrhagic Shock (resolved)  CT abdomen pelvis done 10/24 with evidence of cirrhosis and masslike thickening of the lower sigmoid colon concerning for malignancy.  CTA abdomen pelvis performed 1024 with active extravasation into the gastric fundus possibly secondary to traumatic enteric tube placement.  GI scoped the patient on 10/25 and clipped an area of active bleeding.  They removed a total of 1.5 L of blood.  Hemoglobin 10/20 6 AM downtrended to 6.0, so 2 units of blood transfused.  GI stated this was consistent with 10/25 scope and likely did not represent increased bleeding.  Hb steadily improving with Hb of 9.2 on 10/28.   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 Given shock of unclear etiology. CTAP demonstrating cirrhosis. Likely that cirrhosis is contributing to hypotension.   - Continue with lactulose  30 g every 2 hours, hold parameters: Greater than 2 L  - Goal stool output 1500 mL daily  - NGT in place; TF and FWF  - Daily MELD labs and CMP  -4 L of stool output yesterday     MELD 3.0: 15 at 04/05/2024  4:31 AM  MELD-Na: 13 at 04/05/2024  4:31 AM  Calculated from:  Serum Creatinine: 0.43 mg/dL (Using min of 1 mg/dL) at 88/09/7972  5:68 AM  Serum Sodium: 143 mmol/L (Using max of 137 mmol/L) at 04/05/2024  4:31 AM  Total Bilirubin: 1.3 mg/dL at 88/09/7972  5:68 AM  Serum Albumin: 2.4 g/dL at 88/09/7972  5:68 AM  INR(ratio): 1.61 at 04/03/2024 10:26 AM  Age at listing (hypothetical): 78 years  Sex: Female at 04/05/2024  4:31 AM    Provider Malnutrition Assessment:  Body mass index is 41 kg/m??. BMI Interpretation: >/= 30 and < 40, consistent with obesity, clinically significant requiring additional resources and complicating multiple aspects of patient care.  GLIM criteria:   Pt does not meet criteria  -I have screened this patient for malnutrition and they did NOT meet criteria for malnutrition based on GLIM criteria.  -TF and FWF; Dietician consulted, appreciate assistance  Heme/Coag   Acute blood loss anemia  Hemoglobin down trended to 6.0 on the morning of 10/26 thought to be secondary to acute GI bleed that was scoped and clipped 10/25.  H/H stable.  -CBC daily  - Maintain active T&S    Endocrine   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)   - home anastrozole     Integumentary   NAI   #  - WOCN consulted for high risk skin assessment No. Reason: Not indicated.    Prophylaxis/LDA/Restraints/Consults   ICU Checklist completed: yes (see ICU rounding navigator in Epic)    Patient Lines/Drains/Airways Status       Active Active Lines, Drains, & Airways       Name Placement date Placement time Site Days    Tracheostomy Shiley 6 04/20/24  1430  6  less than 1    NG/OG Tube Feedings 10 Fr. Right nostril 04/01/24  1138  Right nostril  20    External Urinary Device 04/13/24 With Suction 04/13/24  1700  -- 7    PICC Double Lumen 04/06/24 Left Basilic 04/06/24  1558  Basilic  14    Peripheral IV 88/79/74 Right Wrist 04/21/24  0830  Wrist  less than 1                  Patient Lines/Drains/Airways Status       Active Wounds       Name Placement date Placement time Site Days    Wound 03/01/24 Other (Comment) Toe (Comment which one) Left;Other (Comment) Blister present on arrival, tx already in outpatient setting, in process of healing, surrounding erythema 03/01/24  0129  Toe (Comment which one)  51    Wound 03/13/24 Irritant Contact Dermatitis Incontinence Sacrum Mid gluteal cleft MASD? 03/13/24  --  Sacrum  39                  Goals of Care     Code Status:   Orders Placed This Encounter   Procedures Full Code     Standing Status:   Standing     Number of Occurrences:   1        Designated Healthcare Decision Maker:  Ms. Cumpton designated healthcare decision maker(s) is/are   HCDM (patient stated preference): Trovato,[name] - Spouse - [phone]    HCDM (patient stated preference): [place],[name] - Daughter - [phone]. See HCDM section of Epic sidebar/storyboard or ACP tab in patient chart for details regarding active HCDMs and patient capacity for decision-making.      Subjective     Patient intubated.  Withdraws to pain. Opens eyes to stimulation. Does not follow commands.    Objective     Vitals - past 24 hours  Temp:  [36.8 ??C (98.2 ??F)-37.2 ??C (98.9 ??F)] 36.9 ??C (98.4 ??F)  Pulse:  [80-111] 88  SpO2 Pulse:  [81-111] 91  Resp:  [15-33] 29  BP: (97-195)/(44-140) 109/53  FiO2 (%):  [30 %-35 %] 35 %  SpO2:  [95 %-100 %] 100 % Intake/Output  I/O last 3 completed shifts:  In: 5030.3 [P.O.:180; I.V.:410.8; NG/GT:4170; IV Piggyback:269.5]  Out: 5925 [Urine:800; Stool:5125]     Physical Exam:    General: intubated on my examination. Trach in place. not following commands  HEENT: normocephalic, atraumatic   CV: pulses palpable, regular borderline tachycardia   Pulm: Rhonchi w transmitted upper airway sounds throughout  GI: soft, NTND, + BS  MSK: Bilateral LE pitting edema  Skin: Moist skin breakdown around trach.  Neuro: Withdrawals to painful stimuli.  Not following commands.  Reflexes intact    Continuous Infusions:   Infusions Meds[1]    Scheduled Medications:   Scheduled Medications[2]    PRN medications:  PRN Medications[3]    Data/Imaging Review: Reviewed in Epic and personally interpreted on 04/21/2024. See EMR for detailed results.    Waddell Mead, DO  PGY-1 Emergency Medicine               [1]    heparin 11 Units/kg/hr (04/20/24 1717)    NORepinephrine bitartrate-NS Stopped (04/19/24 0050)   [2]    cyanocobalamin (vitamin B-12)  1,000 mcg Enteral tube: gastric Daily    ergocalciferol  100 mcg Enteral tube: gastric Daily    esomeprazole  40 mg Enteral tube: gastric Daily    famotidine  20 mg Enteral tube: gastric BID    flu vac 2025 [id number](PF)  0.5 mL Intramuscular During hospitalization    folic acid   1 mg Enteral tube: gastric Daily    lacosamide  100 mg Intravenous BID    lactulose   30 g Enteral tube: gastric Q4H    levETIRAcetam  1,000 mg Intravenous Q12H SCH    melatonin  3 mg Enteral tube: gastric QPM    metoPROLOL tartrate  25 mg Enteral tube: gastric BID    multivitamins (ADULT)  1 tablet Enteral tube: gastric Daily    rifAXIMin   550 mg Enteral tube: gastric BID    sodium chloride   10 mL Intravenous Q8H    sodium chloride   10 mL Intravenous Q8H    thiamine  mononitrate (vit B1)  100 mg Enteral tube: gastric Daily   [3] acetaminophen , [COMPLETED] alteplase **AND** alteplase, heparin (porcine)

## 2024-04-21 NOTE — Procedures (Addendum)
 FINAL LONGTERM VIDEO EEG MONITORING REPORT    Identifying Information   NAME: [name]    MRN: [id number]   DOB: [birth year]    Ordering Provider:  Velma Glendia Bevels   LOC: 4326/4326-01    Study Information  EEG Start: 04/18/24 at 1700  EEG End: 11/**/2025 at TBD        HISTORY: 78 y.o. female with history of HFpEF, afib, and MASLD cirrhosis admitted with respiratory failure, hepatic encephalopathy, and septic shock course with course c/b hypernatremia. Consult for c/f seizure.   MRI brain 11/16. -Diffuse cortical restricted diffusion greatest within the bilateral parieto-occipital regions.     INDICATION:  Evaluate for Electrographic Seizures and Seizures     PATIENT STATE: Somnolent    EEG TECHNICAL DESCRIPTION   Conditions of Recording:  Continuous EEG with simultaneous video recording was performed utilizing 21 active electrodes placed according to the international 10-20 system.  The study was recorded digitally with a bandpass of 1-70Hz  and a sampling rate of 200Hz  and was reviewed with the possibility of multiple reformatting.  The study was digitally processed with potential spike and seizure events identified for physician analysis and review.  Patient recognized events were identified by a push button marker and reviewed by the physician. Simultaneous video was reviewed for all patient events.    DAY 1 EEG DESCRIPTION: 11/17725  at 1700 to 04/19/2024 at 0430 Winfield Nora, MD)   Disconnected for MRI   Relevant Medications: levetiracetam (Keppra) 750 mg BID  Loaded with Keppra @1620   Versed  give @2009     Background:  Background is continuous initially without any clear PDR  There was a loss of background organization. Instead, the background was characterized by normal (>20 to <150 uV) diffuse 2-5 hz theta delta slowing. There was reactivity to stimulation.    There was not transition to sleep  .  From 6 pm onwards the background became more discontinuous with intermittent periodic discharge -->burst attenuation >suppression lasting from 2-10 seconds.  The burst were posterior dominant high amplitude sharply contoured wave with faster activity lasting for 0.5 -1 second with inter burst interval varying from 1 -5 seconds.  Though towards the end of the study the discharges were at times like generalized periodic discharges        Interictal Epileptiform Activity:  There were bilaterally independent/generalized continuous epileptiform periodic patterns noted in posterior quadrant at 1 Hz.The discharge were complex of moderate to high amplitude sharply contoured waves with faster activity [LPD +F]    Ictal Activity:  The  interval between periodic waves occasionally  became continuous and rhythmic with sharply contoured theta waves with superadded fast activity  with spread to anterior frontocentral chains and can occur independently on left and right side.  At times the pattern lasted less than <10 seconds suggestive of BRIEF POTENTIALLY ICTAL RHYTHMIC DISCHARGES (BIRDs)  After versed  at 2009 the background remained discontinuous,though ictal evolution was seen till 1:00 am  Electrographic seizures #1@1 :06am  The burst become continuous evolve in frequency to 2-3 Hz with intermixed faster frequency and lasts for  10 12 seconds and is better evolved on left side  No clinical features- provider was bedside  There were at least 6 electrographic seizures from left posterior quadrant from 4:00-4:30 am from left posterior quadrant without any clinical correlate       DAY 2 EEG DESCRIPTION: 11/18725  at 04:30 to 04/20/2024 at 0430 Winfield Nora, MD)     Relevant Medications: levetiracetam (Keppra) 750 mg  BID  Keppra 1000 mg BID  Vimpat 100 mg BID     Background:  Background remains disorganized with no clear PDR.It comprises of posterior dominant high amplitude sharp waves complex with superadded fast  1 hz.[Generalized periodic discharges] The interdischarge interval of 1-2 sec consists of low moderate amplitude delta waves  No sleep transients    Ictal pattern  BIRDS -multiple--> reduced as the study progressed  Left posterior quadrant electrographic seizure  Last of the ictal pattern seen at 1007    DAY 3 EEG DESCRIPTION: 04/20/24  at 04:30 to 04/21/2024 at 1119 Winfield Nora, MD)     Relevant Medications: levetiracetam (Keppra) 750 mg BID  Keppra 1000 mg BID  Vimpat 100 mg BID     Background:  Background remains disorganized with no clear PDR.It comprises of posterior quadrant high amplitude sharp waves complex with superadded fast at 1 hz.[s] The interdischarge interval of 1-2 sec consists of low moderate amplitude delta waves  No sleep transients    Ictal pattern  No ictal pattern seen  Last seen on 04/19/2024 @1007     EEG SUMMARY INTERPRETATION:   This is an abnormal EEG due to:  Left posterior quadrant seizures and BIRDS  Continuous Posterior quadrant lateralized periodic discharge plus fast [LPDs +F]  Disorganized slow background  Last ictal pattern since 10 am on 11/18    CLINICAL CORRELATION / SUMMARY of RECORDING:   These findings are consistent with abnormal 3 days video EEG with   Left posterior quadrant seizures.  a non-specific moderate degree of encephalopathy.     Focal epileptiform potentials can be seen in patients with partial-onset seizures, but do not in themselves confirm a diagnosis of seizures or epilepsy. and Lateralized periodic discharges (LPDs) can be associated with HSV encephalitis, infarction, focal seizures, and other focal or lateralized cerebral pathologies. At the very least, they indicate an elevated risk of focal onset seizures.     Interpreting Provider: Maryfrances Nora, MD    2HELPS2B Seizure Risk Score  Clinical risk score based on EEG findings and clinical history of seizures to aid in determination of optimal duration of EEG monitoring for detection of electrographic seizures.  Score has not been validated in patients under age 81 or following cardiac arrest.    Lateralized / Bilateral Independent Periodic Discharges or Lateralized Rhythmic Delta Activity - 1 point     Add 1 point if known history of epilepsy or prior clinical seizure.      Risk Group     Seizure Risk   at 72 hours   Duration of Monitoring    Seizure risk < 5%     Duration of Monitoring    Seizure risk < 2%       Low Risk,   Score = 0     3.1%   1 Hour   3.3 Hours     Medium Risk,   Score = 1     12%   12 Hours   29 Hours     High Risk,   Score = 2 or greater                >25%   >24 Hours   >30 Hours   Struck et al JAMA Neurology, January 2020    Not appropriate for EEG monitoring being performed for the following:  Treatment of status epilepticus or seizures already documented on EEG  Monitoring sedation/ burst suppression for management of intracranial pressure and/or paralyzed patients  Diagnostic evaluation  of transient episodes concerning for possible seizures (spell capture)   Patients s/p cardiac arrest undergoing targeted temperature management      Amon dunker al. American Clinical Neurophysiology Society's Standardized Critical Care EEG Terminology: 2021 Version. Journal of Clinical Neurophysiology 38(1):p 1-29, January 2021.

## 2024-04-21 NOTE — Consults (Signed)
 [facility] Liaison - Referred to Venous Access Team Note      CVAD Liaison was consulted for issues with PICC line. Referred to Venous Access Team.     Thank you for this consult,  Burnard Saras, RN RN    Consult Time  5 minutes (min)

## 2024-04-21 NOTE — Plan of Care (Signed)
 Shift Summary  Pt remains on vent via trach.  Trialed HFTC from PS.  Pt became more SOB and began to use accessory muscles, ABG worsened.  Placed back on 30% PS with improvement of symptoms and ABG.  Pt remains in afib 100s-110s.  Stable BP.  Remains on hep gtt.  Pt not following commands, opens eyes intermittently and moving L arm.  Stool output decreased after adjustment of lactulose  frequency, but still over 2L/24 period.  Diminished UOP via purewick.  Bath/CHG completed.  RUE wrapped with optilock/kerlix d/t weeping.    Absence of Hospital-Acquired Illness or Injury: No new hospital-acquired injuries or illnesses documented during the shift; skin and device pressure protection measures were maintained and perineal care was provided.    Optimal Comfort and Wellbeing: CPOT scores remained at 0 throughout the shift, with facial expression and body movement observations indicating comfort and absence of pain.    Optimal Gas Exchange: Respiratory rate fluctuated with periods of abnormal elevation, but SpO2 remained above 96% and oxygen therapy was adjusted as needed; FiO2 and pressure support were modified during the shift, and ventilation status alternated between on and standby.    Maintenance of Heart Failure Symptom Control: Generalized edema persisted and cardiac rhythm showed atrial fibrillation with sinus tachycardia later in the shift; MAP values varied, with some episodes of low readings, and cardiac status remained with exceptions to WDL.    Absence of Infection Signs and Symptoms: Sepsis risk scores increased in the afternoon, but temperature remained stable and infection prevention interventions were maintained; perineal care and CHG wipes were provided, and aseptic technique was documented.

## 2024-04-21 NOTE — Consults (Signed)
 Follow-Up Consult Note        Requesting Attending Physician:  Morene ONEIDA Dec, MD  Service Requesting Consult: Medical ICU (MDI)     Assessment and Plan     [name] is a 78 y.o. female pmhx HFpEF, afib, and MASLD cirrhosis admitted with respiratory failure, hepatic encephalopathy, and septic shock course with course c/b hypernatremia on whom I have been asked by Morene ONEIDA Dec, MD to consult for c/f seizure.    # Roving eye movements:  # Metabolic encephalopathy   Neurology team consulted for evaluation of possible nonrhythmic roving eye movements lasting several hours in duration, in context of progressively declining mental status over 3-4 days (previously intermittently following commands). Roving eye movements are a sign of diffuse cortical dysfunction with preserved brainstem function, most often seen in metabolic encephalopathy. Similarly presenting symptoms include ping-pong gaze (often associated with metabolic encephalopathy, diffuse cortical dysfunction, or toxic ingestion), and periodic alternating gaze deviation (seen in metabolic encephalopathies. This is consistent with patients long complicated course- initially admitted to OSH with DCCV/TEE which was aborted d/t LAA thrombus, multiple episodes of hypercarbic respiratory failure eventually requiring tracheostomy, PEA arrest 10/24 w/ ROSC after min, shock likely 2/2 GIB, E.coli bacteremia, and multiple episodes of hyperammonemia (31 on 9/30, 251 10/10--> 82 10/13-> 160 10/24-> 291 11/16). While patient does have seizures as noted below, these are likely separate from eye movements.    # Diffuse Cortical Restriction  # Non-convulsive Seizures  MRI Brain w/wo Contrast and cvEEG were pursued for further evaluation of the above. MRI demonstrated diffuse cortical restriction of the bilateral parieto-occipital regions, as may be seen in prolonged seizure, PRES, hypoglycemic hypoxic ischemic encephalopathy, acute hyperammonemic encephalopathy. Clinical course not consistent with CJD. These findings are new since 11/2 MRI brain. Imaging discussed at neuroradiology case conference - wide diffuse pattern is overall most suggestive of severe toxic-metabolic etiology- etiology is likely multifactorial including hyperammonemia and/or PRES (labile BP, severe hepatic encephalopathy, renal injury). Pattern does not localize to vascular territory and patient is currently afebrile with no leukocytosis so decreased concern for meningitis/encephalitis.  Patient did have repeated episodes of AMS since prior MRI 11/2- given seizures are subclinical it is possible she was having seizures previously. Seizure activity could be contributing to MRI changes, but is not favored to be primary etiology at this time.     cvEEG demonstrated LPD+F c/f IIC with possible ictal features.  Transient improvement on EEG with LEV load (transitioned to burst attenuation pattern with persistent bilateral posterior BIRDs). LCS load was added again with transient improvement.     EEG showed bilateral synchronous posterior quadrant LPDs at 1 hz, without ictal patterns on review of data from 11/18 10:00 to 11/20 09:00 - overall improved from prior.   Patient appears reassuringly improved from a clinical standpoint as well - opening eyes to voice, demonstrating some ability to track/regard, semi-localizing to noxious stimulus in the BUE, and weak withdrawal to noxious in the BLE.     # Diffuse hyperreflexia:   On exam noted to have hyperreflexia in bilateral upper extremities with positive hoffman's and pectoralis reflexes, while jaw jerk reflex was absent. Bilateral clonus (4+ beats) also noted. No recent serotonergic medications nor fever or rigidity to suspect a serotonin syndrome. Potentially related to question of a possible C2 vertebral body fracture that was noted on CT cervical spine in May 2024 vs known upper motor neuron changes on MRI brain.     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 Also demonstrated moderate canal stenosis at C4-C5, moderate/severe canal stenosis at C6-C7, as well as severe neuroforaminal stenosis at bilateral C5-C6 and left C6-C7.     Radiographic evidence of abnormal cord signal at C5-C6 on T2-weighted imaging is likely contributing to exam findings of hyperreflexia in the BUE. Potential cord compression likely warrants neurosurgical consultation.    Recommendations:  Discontinue cvEEG  Continue Keppra 1g BID (renally dosed), and Vimpat 100 mg BID   Recommend neurosurgery consult for evaluation of radiographic evidence of possible C5-C6 canal stenosis with cord signal change  Management of primary medical issues including NASH cirrhosis and elevated ammonia per primary team.   Would advise repeat MRI Brain in 2 weeks (~12/4) to assess for progression/resolution of previously noted cortical restricted diffusion    We will peripherally follow for now. Please feel free to reach out with any questions or concerns.    This patient was seen and discussed with Dr. Christiane, who agrees with the above assessment and plan.    Recommendations discussed with primary team.     Levander Severin, MD  Resident Physician PGY-4  Department of Neurology       HPI        Reason for Consult: c/f seizure    [name] is a 78 y.o. female on whom I have been asked by Morene ONEIDA Dec, MD to consult for c/f seizure.    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 Also demonstrated moderate canal stenosis at C4-C5, moderate/severe canal stenosis at C6-C7, as well as severe neuroforaminal stenosis at bilateral C5-C6 and left C6-C7.   - Exam today improved from prior, patient with midline gaze, somewhat tracking/regarding, not following commands, but grimaces and semi-localizes to noxious in BUE, and weak w/d to nox in BLE.     Interval History 11/18:   - Overnight was found to have EEG findings c/w IIC (LPDs+F), prompting initiation of LEV+LCS with some transient improvement  - MRI Brain w/wo was obtained, demosntrating diffuse bilateral parieto-occipital restricted diffusion. Case discussed on Neuroradiology case conference, felt to be most compatible with toxic-metabolic primary etiology.   - Re-reviewed overall history given complicated course, as detailed more extensively in A&P  - Exam now with midline gaze and intact OCRs, overall improved from prior    Initial HPI:  78 yo PMHx HFpEF, afib, and MASLD cirrhosis (MELD 15) admitted for respiratory failure, encephalopathy, and septic shock course with c/b hypernatremia.     At around 1930 on 11/17 was noted to have roving horizontal eye movements. Movements were nonrhythmic but persisted for several hours raising primary teams concerns for possible seizure. They gave 4mg  versed  and per their report movements only slowed down for a brief duration.     No reported facial twitching, limb shaking, fixed lateralized gaze, urinary incontinence, or tongue injury. Per chart review, no past history of seizures.     Per nursing report, patient has gradually become less and less responsive over the last 3 days. She was following commands and moving extremities (squeeze hands, wiggle toes) on 11/14, but now is unresponsive and has no spontaneous movement.     Lactulose  was held for intermittent periods starting 11/12 and was discontinued on 11/15 due to increased bowel movement frequency (  per chart review had 5 BMs on 11/15). Since worsened mentation on 11/16,  lactulose  was resumed.   Ammonia 291 on 11/16, previously 160 on 10/24    Earlier in ICU stay had E. Coli bacteremia -Finished course of ceftriaxone.     On a CTA abdomen in October 2025 was noted to have a chronic T12 compression fracture.   On a CT cervical spine in May 2024 was noted to have questionable lucency involving the left lateral aspect of C2 vertebral body.     Has known history of osteoporosis identified on Dexa scan of femoral neck (11/2022 T score -2.9).     Allergies[1]     Current Medications[2] Prescriptions Prior to Admission[3]    Past Medical History[4]    Past Surgical History[5]    Social History[6]    Family History[7]    Code Status: Full Code     Review of Systems     Unable to obtain due to patient's mental status.       Objective        Temp:  [36.5 ??C (97.7 ??F)-37.2 ??C (98.9 ??F)] 36.9 ??C (98.4 ??F)  Pulse:  [80-114] 90  SpO2 Pulse:  [81-113] 89  Resp:  [15-33] 18  BP: (120-195)/(48-140) 120/53  MAP (mmHg):  [71-158] 76  FiO2 (%):  [30 %] 30 %  SpO2:  [95 %-100 %] 100 %  I/O this shift:  In: 452.8 [P.O.:60; I.V.:22.8; NG/GT:370]  Out: 400 [Stool:400]      Physical Exam:  General Exam:  General: Lying in bed. No obvious distress.   ENT:  Mucous membranes moist. Oropharynx clear.  Cardiovascular:  Regular rate and rhythm.   Respiratory: trach in place.  Gastrointestinal: soft  Extremities: Edema in upper extremities bilateral.   Edema in lower extremities bilateral.    Skin: No obvious rashes or ecchymoses.    Neurological Exam:  Mental Status  LOC: arouses to voice  Orientation: UTA  Speech: UTA  Briefly tracks/regards  Does not follow commands.     Previously: Unable to arouse    Cranial Nerves  Pupils: PERRL 5mm -> 4mm  Corneals: present  Gaze: Midline gaze. Previously: slow roving, nonrhythmic, horizontal eye movements   Face Motor: normal and symmetric  Oculocephalic: present  Cough: strong  Gag: strong    Motor:   RUE: Semi-localizes (reaches towards source of noxious) Previously: no response  LUE: Semi-localizes (reaches towards source of nox, crosses midline) Previously:  no response  RLE: Weak withdrawal to nox. Previously: brisk toe and foot extension to light touch over the dorsum of the foot and triple flexion to noxious  LLE: Weak withdrawal to nox. Previously:  toe and foot extension to light touch over the dorsum of the foot and triple flexion to noxious     Sensory:  response to light touch and noxious as above    Reflexes:   Reflexes Right Left   Biceps  C5 3+ 3+   Brachioradialis C6  3+ 3+   Triceps C7 3+ 3+   Patella L3 (4) N/a N/a   Achilles S1 (S2) >4+ beats clonus >4+ beats clonus   Plantar Response Extensor Extensor   Jaw Jerk CN V Absent   Pectoralis C5-T1 Present Present   Hoffman Present Absent   Cross Adductors L2-4 Absent Absent     Glasgow Coma Score  E3VTM5  Previously: E1VTM3     Diagnostic Studies      All Labs Last 24hrs:   Recent Results (  from the past 24 hours)   Blood Gas, Venous    Collection Time: 04/20/24  5:23 PM   Result Value Ref Range    Specimen Source Venous     FIO2 Venous Not Specified     pH, Venous 7.38 7.32 - 7.43    pCO2, Ven 47 40 - 60 mm Hg    pO2, Ven 42 30 - 55 mm Hg    HCO3, Ven 28 (H) 22 - 27 mmol/L    Base Excess, Ven 2.1 (H) -2.0 - 2.0    O2 Saturation, Venous 72.6 40.0 - 85.0 %    Carboxyhemoglobin, Venous 1.7 (H) <1.2 %    Methemoglobin, Venous <1.0 <1.5 %    Oxyhemoglobin Venous 71.0 40.0 - 85.0 %   Basic Metabolic Panel    Collection Time: 04/21/24  8:48 AM   Result Value Ref Range    Sodium      Potassium      Chloride      CO2 24.0 20.0 - 31.0 mmol/L    Anion Gap      BUN      Creatinine      eGFR CKD-EPI (2021) Female      Glucose 178 70 - 179 mg/dL    Calcium 8.4 (L) 8.7 - 10.4 mg/dL   Hepatic Function Panel    Collection Time: 04/21/24  8:48 AM   Result Value Ref Range    Albumin      Total Protein      Total Bilirubin      Bilirubin, Direct 0.40 (H) 0.00 - 0.30 mg/dL    AST      ALT      Alkaline Phosphatase 154 (H) 46 - 116 U/L   Cystatin C    Collection Time: 04/21/24  8:48 AM   Result Value Ref Range    Cystatin C 1.59 (H) 0.64 - 1.23 mg/L    eGFR CKD-EPI (2012) Cystatin C Female 37 (L) >=60 mL/min/1.69m2   Blood Gas, Venous    Collection Time: 04/21/24  8:56 AM   Result Value Ref Range    Specimen Source Venous     FIO2 Venous Not Specified     pH, Venous 7.38 7.32 - 7.43    pCO2, Ven 45 40 - 60 mm Hg    pO2, Ven 39 30 - 55 mm Hg    HCO3, Ven 26 22 - 27 mmol/L    Base Excess, Ven 1.4 -2.0 - 2.0    O2 Saturation, Venous 62.4 40.0 - 85.0 %    Carboxyhemoglobin, Venous 1.6 (H) <1.2 %    Methemoglobin, Venous <1.0 <1.5 %    Oxyhemoglobin Venous 61.2 40.0 - 85.0 %   APTT    Collection Time: 04/21/24  8:56 AM   Result Value Ref Range    APTT 61.2 (H) 24.8 - 38.4 sec    Heparin Correlation 0.3    CBC w/ Differential    Collection Time: 04/21/24  8:56 AM   Result Value Ref Range    WBC 4.6 3.6 - 11.2 10*9/L    RBC 2.70 (L) 3.95 - 5.13 10*12/L    HGB 8.7 (L) 11.3 - 14.9 g/dL    HCT 73.0 (L) 65.9 - 44.0 %    MCV 99.6 (H) 77.6 - 95.7 fL    MCH 32.2 25.9 - 32.4 pg    MCHC 32.3 32.0 - 36.0 g/dL    RDW 80.3 (H) 87.7 - 15.2 %  MPV 9.7 6.8 - 10.7 fL    Platelet 159 150 - 450 10*9/L    Neutrophils % 68.4 %    Lymphocytes % 17.2 %    Monocytes % 10.9 %    Eosinophils % 2.6 %    Basophils % 0.9 %    Absolute Neutrophils 3.2 1.8 - 7.8 10*9/L    Absolute Lymphocytes 0.8 (L) 1.1 - 3.6 10*9/L    Absolute Monocytes 0.5 0.3 - 0.8 10*9/L    Absolute Eosinophils 0.1 0.0 - 0.5 10*9/L    Absolute Basophils 0.0 0.0 - 0.1 10*9/L    Anisocytosis Moderate (A) Not Present     CT head 11/17   No acute abnormalities on personal review    MRI Brain W Wo Contrast, 04/03/24  Impression  Multiple sequences are degraded due to motion which limits evaluation.  Within the limitations no acute intracranial abnormality.  Severe periventricular and deep white matter T2/FLAIR hyperintense signal compatible with small vessel ischemic changes. Multiple microhemorrhages as described. Consider cognitive assessment.  Remote left cerebellar infarct.  Small right mastoid effusion.    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      Moderate to severe central canal stenosis at C6-7 and moderate canal stenosis at C4-C5 secondary to disc osteophyte complex and ligament flavum hypertrophy.      Severe neural foraminal stenosis bilaterally at C5-6. Severe left-sided neural foraminal stenosis at C6-7.                           [1]   Allergies  Allergen Reactions    Rocuronium Anaphylaxis     Suspected intraoperative anaphylaxis 04/15/2024. See anesthetic from tracheostomy. Tryptase pending at time of listing.     Sulfa (Sulfonamide Antibiotics) Anaphylaxis    Sulfur  Anaphylaxis     Tolerated sulfur  colloid injection without incident 09/03/2020.    Aspirin      thrombocytopenia   [2]   Current Facility-Administered Medications   Medication Dose Route Frequency Provider Last Rate Last Admin    acetaminophen  (TYLENOL ) tablet 650 mg  650 mg Oral Q4H PRN Sines, Benjamin Jacob, MD   650 mg at 04/12/24 1214    alteplase (ACTIVase) injection small catheter clearance 2 mg  2 mg Intravenous Once PRN Ancell, Kaitlyn R, MD        cyanocobalamin (vitamin B-12) tablet 1,000 mcg  1,000 mcg Enteral tube: gastric Daily Sines, Benjamin Jacob, MD   1,000 mcg at 04/21/24 0828    esomeprazole (NEXIUM) granules 40 mg  40 mg Enteral tube: gastric Daily Sines, Benjamin Jacob, MD   40 mg at 04/21/24 0829    famotidine (PEPCID) tablet 20 mg  20 mg Enteral tube: gastric BID Sweeney, [name] A, MD   20 mg at 04/21/24 9171    flu vacc 2025-26 (65 yr up) (PF)(FLUAD)45 mcg(72mcgx3)/0.5 ml IM syringe  0.5 mL Intramuscular During hospitalization Sines, Benjamin Jacob, MD        folic acid  (FOLVITE ) tablet 1 mg  1 mg Enteral tube: gastric Daily Sines, Benjamin Jacob, MD   1 mg at 04/21/24 0828    heparin (porcine) 1000 unit/mL injection 2,000 Units  2,000 Units Intravenous Q6H PRN Sines, Benjamin Jacob, MD        heparin 25,000 Units/250 mL (100 units/mL) in 0.45% saline infusion (premade)  0-24 Units/kg/hr Intravenous Continuous Malka Waddell HERO, DO 11.41 mL/hr at 04/20/24 1717 11 Units/kg/hr at 04/20/24 1717    lacosamide (VIMPAT) injection 100 mg  100 mg Intravenous BID Dorise Hove, MD   100 mg at 04/21/24 9152    lactulose  oral solution  30 g Enteral tube: gastric Q2H Seilheimer, Waddell HERO, DO   30 g at 04/21/24 0829    levETIRAcetam (KEPPRA) injection 1,000 mg  1,000 mg Intravenous Q12H [facility] Dorise Hove, MD   1,000 mg at 04/21/24 0850    melatonin tablet 3 mg  3 mg Enteral tube: gastric QPM [place], Chinonyerem A, MD   3 mg at 04/20/24 1845    metoPROLOL tartrate (Lopressor) tablet 25 mg  25 mg Enteral tube: gastric BID Seilheimer, Waddell HERO, DO   25 mg at 04/21/24 0828    multivitamins, therapeutic with minerals tablet 1 tablet  1 tablet Enteral tube: gastric Daily Sines, Benjamin Jacob, MD   1 tablet at 04/21/24 9171    NORepinephrine 8 mg in dextrose  5 % 250 mL (32 mcg/mL) infusion PMB  0-30 mcg/min Intravenous Continuous Dorise Hove, MD   Paused at 04/19/24 0050    rifAXIMin  (XIFAXAN ) oral suspension  550 mg Enteral tube: gastric BID Sines, Benjamin Jacob, MD   550 mg at 04/21/24 9171    sodium chloride  (NS) 0.9 % flush 10 mL  10 mL Intravenous Q8H Violette Morene Cadet, MD   10 mL at 04/20/24 1848    sodium chloride  (NS) 0.9 % flush 10 mL  10 mL Intravenous Q8H Violette Morene Cadet, MD   10 mL at 04/21/24 0220    thiamine  mononitrate (vit B1) tablet 100 mg  100 mg Enteral tube: gastric Daily Sines, Benjamin Jacob, MD   100 mg at 04/21/24 9170   [3]   Medications Prior to Admission   Medication Sig Dispense Refill Last Dose/Taking    anastrozole  (ARIMIDEX ) 1 mg tablet Take 1 tablet (1 mg total) by mouth daily. 90 tablet 3 03/10/2024 at  6:00 AM    carvedilol  (COREG ) 3.125 MG tablet Take 1 tablet (3.125 mg total) by mouth two (2) times a day. 180 tablet 2 03/10/2024 Morning    folic acid  (FOLVITE ) 1 MG tablet Take 1 tablet (1 mg total) by mouth daily.   03/10/2024 at  6:00 AM furosemide (LASIX) 40 MG tablet Take 1 tablet (40 mg total) by mouth daily as needed. 30 tablet 11 03/10/2024 at  6:00 AM    magnesium  oxide (MAG-OX) 400 mg (241.3 mg elemental magnesium ) tablet Take 1 tablet (400 mg total) by mouth two (2) times a day. 180 tablet 3 03/10/2024 at  6:00 AM    potassium chloride  20 MEQ ER tablet Take 1 tablet (20 mEq total) by mouth two (2) times a day. 60 tablet 11 03/09/2024 at  6:00 AM    spironolactone (ALDACTONE) 25 MG tablet Take 1 tablet (25 mg total) by mouth daily. 90 tablet 1 03/10/2024 at  6:00 AM    thiamine  (B-1) 100 MG tablet Take 1 tablet (100 mg total) by mouth daily.   03/10/2024 Morning    vitamin E-268 mg, 400 UNIT, 268 mg (400 UNIT) capsule Take 800 mg by mouth in the morning. 2 tabs.   03/10/2024 Morning    acetaminophen  (TYLENOL ) 325 MG tablet Take 2 tablets (650 mg total) by mouth every six (6) hours as needed for pain.  0     [Paused] alendronate  (FOSAMAX ) 70 MG tablet Take 1 tablet (70  mg total) by mouth every seven (7) days. (Patient not taking: Reported on 03/07/2024) 12 tablet 3     calcium  carbonate (OS-CAL) 1,250 mg (500 mg elem calcium ) tablet Take 1 tablet (500 mg elem calcium  total) by mouth in the morning. (Patient not taking: Reported on 03/07/2024)       cyanocobalamin  1000 MCG tablet Take 1 tablet (1,000 mcg total) by mouth daily.       [EXPIRED] nystatin  (MYCOSTATIN ) 100,000 unit/gram powder Apply to affected area 3 times daily 15 g 0    [4]   Past Medical History:  Diagnosis Date    A-fib (CMS-HCC)     Alcoholism    (CMS-HCC)     Alcoholism /alcohol abuse     Cirrhosis    (CMS-HCC)     Depression     Hypertension     Liver disease     Malignant neoplasm of overlapping sites of right breast in female, estrogen receptor positive    (CMS-HCC) 10/03/2020   [5]   Past Surgical History:  Procedure Laterality Date    BREAST BIOPSY Right     benign-a long time ago    BREAST BIOPSY Right 07/2020    malignant    BREAST LUMPECTOMY Right     4 2022    CENTRAL LINE  03/25/2024    CHOLECYSTECTOMY      PR BX/REMV,LYMPH NODE,DEEP AXILL Right 09/03/2020    Procedure: BX/EXC LYMPH NODE; OPEN, DEEP AXILRY NODE;  Surgeon: Alm Elsie Como, MD;  Location: [facility] OR [facility];  Service: Surgical Oncology Breast    PR CARDIOVERSION, ELECTIVE;EXTERN N/A 03/10/2024    Procedure: CARDIOVERSION, ELECTIVE, ELECTRICAL CONVERSION OF ARRHYTHMIA; EXTERNAL;  Surgeon: Sedalia Velma Hamilton, MD;  [facility];  Service: Cardiology    PR ECHO HEART,TRANSESOPHAGEAL,COMPLETE Midline 03/10/2024    Procedure: ECHOCARDIOGRAPHY, TRANSESOPHAGEAL, REAL-TIME WITH IMAGE DOCUMENTATION;  Surgeon: Sedalia Velma Hamilton, MD;  Location: [facility] OR [facility];  Service: Cardiology    PR INTRAOPERATIVE SENTINEL LYMPH NODE ID W DYE INJECTION Right 09/03/2020    Procedure: INTRAOPERATIVE IDENTIFICATION SENTINEL LYMPH NODE(S) INCLUDE INJECTION NON-RADIOACTIVE DYE, WHEN PERFORMED;  Surgeon: Alm Elsie Como, MD;  Location: [facility] OR [facility];  Service: Surgical Oncology Breast    PR MASTECTOMY, PARTIAL Right 09/03/2020    Procedure: MASTECTOMY, PARTIAL (EG, LUMPECTOMY, TYLECTOMY, QUADRANTECTOMY, SEGMENTECTOMY);  Surgeon: Alm Elsie Como, MD;  [facility]: [facility] OR [facility];  Service: Surgical Oncology Breast    PR TRACHEOSTOMY, PLANNED N/A 04/15/2024    Procedure: TRACHEOSTOMY PLANNED (SEPART PROC);  Surgeon: Pixie Loader, MD;  Location: OR [facility];  Service: ENT    PR UPPER GI ENDOSCOPY,BIOPSY N/A 02/03/2018    Procedure: UGI ENDOSCOPY; WITH BIOPSY, SINGLE OR MULTIPLE;  Surgeon: Eleanor Dewey Sorrel, MD;  Location: [facility] PROCEDURES [facility];  Service: Gastroenterology    RADIATION Right     unsure finish date    TUBAL LIGATION     [6]   Social History  Socioeconomic History    Marital status: Married     Spouse name: None    Number of children: None    Years of education: None    Highest education level: None   Tobacco Use    Smoking status: Never     Passive exposure: Past    Smokeless tobacco: Never   Vaping Use Vaping status: Never Used   Substance and Sexual Activity    Alcohol use: Not Currently    Drug use: Never    Sexual activity: Not Currently  Social History Narrative    Daughter fills med boxes weekly.      Social Drivers of Health     Food Insecurity: No Food Insecurity (03/02/2024)    Hunger Vital Sign     Worried About Running Out of Food in the Last Year: Never true     Ran Out of Food in the Last Year: Never true   Tobacco Use: Low Risk (03/30/2024)    Patient History     Smoking Tobacco Use: Never     Smokeless Tobacco Use: Never     Passive Exposure: Past   Transportation Needs: No Transportation Needs (03/02/2024)    PRAPARE - Transportation     Lack of Transportation (Medical): No     Lack of Transportation (Non-Medical): No   Alcohol Use: Not At Risk (04/20/2023)    Alcohol Use     How often do you have a drink containing alcohol?: Never     How many drinks containing alcohol do you have on a typical day when you are drinking?: 1 - 2     How often do you have 5 or more drinks on one occasion?: Never   Housing: Low Risk (03/02/2024)    Housing     Within the past 12 months, have you ever stayed: outside, in a car, in a tent, in an overnight shelter, or temporarily in someone else's home (i.e. couch-surfing)?: No     Are you worried about losing your housing?: No   Physical Activity: Inactive (04/20/2023)    Exercise Vital Sign     Days of Exercise per Week: 0 days     Minutes of Exercise per Session: 0 min   Utilities: Low Risk (04/20/2023)    Utilities     Within the past 12 months, have you been unable to get utilities (heat, electricity) when it was really needed?: No   Stress: No Stress Concern Present (04/20/2023)    [employer] of Occupational [employer] - Occupational Stress Questionnaire     Feeling of Stress : Only a little   Interpersonal Safety: Patient Unable To Answer (03/11/2024)    Interpersonal Safety     Unsafe Where You Currently Live: Patient unable to answer     Physically Hurt by Anyone: Patient unable to answer     Abused by Anyone: Patient unable to answer   Social Connections: Moderately Isolated (04/20/2023)    Social Connection and Isolation Panel     Frequency of Communication with Friends and Family: More than three times a week     Frequency of Social Gatherings with Friends and Family: More than three times a week     Attends Religious Services: Never     Database Administrator or Organizations: No     Attends Engineer, Structural: Never     Marital Status: Married   Programmer, Applications: Low Risk (03/02/2024)    Overall Financial Resource Strain (CARDIA)     Difficulty of Paying Living Expenses: Not hard at all   Health Literacy: Low Risk (04/20/2023)    Health Literacy     : Never   Internet Connectivity: No Internet connectivity concern identified (04/20/2023)    Internet Connectivity     Do you have access to internet services: Yes     How do you connect to the internet: Personal Device at home     Is your internet connection strong enough for you to watch video on your device without major problems?:  Yes     Do you have enough data to get through the month?: Yes     Does at least one of the devices have a camera that you can use for video chat?: Yes   [7]   Family History  Problem Relation Age of Onset    Heart disease Mother     No Known Problems Father     No Known Problems Sister     No Known Problems Daughter     No Known Problems Maternal Grandmother     No Known Problems Maternal Grandfather     No Known Problems Paternal Grandmother     No Known Problems Paternal Grandfather     Lupus Brother     No Known Problems Other     BRCA 1/2 Neg Hx     Breast cancer Neg Hx     Cancer Neg Hx     Colon cancer Neg Hx     Endometrial cancer Neg Hx     Ovarian cancer Neg Hx     Mental illness Neg Hx     Substance Abuse Disorder Neg Hx

## 2024-04-21 NOTE — Plan of Care (Signed)
 [facility]TA pt orientation. Unable to follow commands, RASS -4. EEG maintained. MRI c-spine completed. VSS. On vent 30%. Afebrile. No c/o of pain. Purewick in place, URO diminished. FMS in place, robust output. Pt remains on TF at goal. Q2H turns and standard precautions maintained. No falls/injuries this shift. Heparin gtt maintained. See MAR/flowsheets for further information.    Problem: Skin Injury Risk Increased  Goal: Skin Health and Integrity  Outcome: Shift Focus  Intervention: Optimize Skin Protection  Recent Flowsheet Documentation  Taken 04/21/2024 0400 by Edsel Silvano BROCKS, RN  Head of Bed [facility]) Positioning: HOB at 30-45 degrees  Taken 04/21/2024 0200 by Edsel Silvano BROCKS, RN  Head of Bed [facility]) Positioning: HOB at 30-45 degrees  Taken 04/21/2024 0000 by Edsel Silvano BROCKS, RN  Head of Bed [facility]) Positioning: HOB at 30-45 degrees  Taken 04/20/2024 2200 by Edsel Silvano BROCKS, RN  Head of Bed [facility]) Positioning: HOB at 30-45 degrees  Taken 04/20/2024 2000 by Edsel Silvano BROCKS, RN  Pressure Reduction Techniques:   heels elevated off bed   weight shift assistance provided  Head of Bed (HOB) Positioning: HOB at 30-45 degrees  Pressure Reduction Devices:   heel offloading device utilized   positioning supports utilized   pressure-redistributing mattress utilized   specialty bed utilized  Skin Protection:   adhesive use limited   incontinence pads utilized   skin-to-device areas padded   skin-to-skin areas padded   transparent dressing maintained   tubing/devices free from skin contact     Problem: Breathing Pattern Ineffective  Goal: Effective Breathing Pattern  Outcome: Shift Focus  Intervention: Promote Improved Breathing Pattern  Recent Flowsheet Documentation  Taken 04/21/2024 0400 by Edsel Silvano BROCKS, RN  Head of Bed [facility]) Positioning: HOB at 30-45 degrees  Taken 04/21/2024 0200 by Edsel Silvano BROCKS, RN  Head of Bed [facility]) Positioning: HOB at 30-45 degrees  Taken 04/21/2024 0000 by Edsel Silvano BROCKS, RN  Head of Bed [facility]) Positioning: HOB at 30-45 degrees  Taken 04/20/2024 2200 by Edsel Silvano BROCKS, RN  Head of Bed [facility]) Positioning: HOB at 30-45 degrees  Taken 04/20/2024 2000 by Edsel Silvano BROCKS, RN  Head of Bed [facility]) Positioning: HOB at 30-45 degrees     Problem: Fall Injury Risk  Goal: Absence of Fall and Fall-Related Injury  Outcome: Shift Focus  Intervention: Promote Injury-Free Environment  Recent Flowsheet Documentation  Taken 04/20/2024 2000 by Edsel Silvano BROCKS, RN  Safety Interventions:   aspiration precautions   bed alarm   bleeding precautions   enteral feeding safety   fall reduction program maintained   no IV/BP/blood draw left arm   room near unit station     Problem: Non-Violent Restraints  Goal: Patient will remain free of restraint events  Outcome: Shift Focus  Goal: Patient will remain free of physical injury  Outcome: Shift Focus     Problem: Adult Inpatient Plan of Care  Goal: Absence of Hospital-Acquired Illness or Injury  Intervention: Identify and Manage Fall Risk  Recent Flowsheet Documentation  Taken 04/20/2024 2000 by Edsel Silvano BROCKS, RN  Safety Interventions:   aspiration precautions   bed alarm   bleeding precautions   enteral feeding safety   fall reduction program maintained   no IV/BP/blood draw left arm   room near unit station  Intervention: Prevent Skin Injury  Recent Flowsheet Documentation  Taken 04/21/2024 0400 by Edsel Silvano BROCKS, RN  Positioning for Skin: Left  Taken 04/21/2024 0200 by Edsel Silvano BROCKS, RN  Positioning for Skin:  Right  Taken 04/21/2024 0000 by Edsel Silvano BROCKS, RN  Positioning for Skin: Left  Taken 04/20/2024 2200 by Edsel Silvano BROCKS, RN  Positioning for Skin: Right  Taken 04/20/2024 2000 by Edsel Silvano BROCKS, RN  Positioning for Skin: Left  Device Skin Pressure Protection:   absorbent pad utilized/changed   adhesive use limited   positioning supports utilized   pressure points protected  Skin Protection:   adhesive use limited   incontinence pads utilized skin-to-device areas padded   skin-to-skin areas padded   transparent dressing maintained   tubing/devices free from skin contact  Intervention: Prevent Infection  Recent Flowsheet Documentation  Taken 04/20/2024 2000 by Edsel Silvano BROCKS, RN  Infection Prevention:   hand hygiene promoted   personal protective equipment utilized   rest/sleep promoted   single patient room provided     Problem: Gas Exchange Impaired  Goal: Optimal Gas Exchange  Intervention: Optimize Oxygenation and Ventilation  Recent Flowsheet Documentation  Taken 04/21/2024 0400 by Edsel Silvano BROCKS, RN  Head of Bed [facility]) Positioning: HOB at 30-45 degrees  Taken 04/21/2024 0200 by Edsel Silvano BROCKS, RN  Head of Bed [facility]) Positioning: HOB at 30-45 degrees  Taken 04/21/2024 0000 by Edsel Silvano BROCKS, RN  Head of Bed [facility]) Positioning: HOB at 30-45 degrees  Taken 04/20/2024 2200 by Edsel Silvano BROCKS, RN  Head of Bed [facility]) Positioning: HOB at 30-45 degrees  Taken 04/20/2024 2000 by Edsel Silvano BROCKS, RN  Head of Bed [facility]) Positioning: HOB at 30-45 degrees     Problem: Wound  Goal: Absence of Infection Signs and Symptoms  Intervention: Prevent or Manage Infection  Recent Flowsheet Documentation  Taken 04/20/2024 2000 by Edsel Silvano BROCKS, RN  Infection Management: aseptic technique maintained  Goal: Skin Health and Integrity  Intervention: Optimize Skin Protection  Recent Flowsheet Documentation  Taken 04/21/2024 0400 by Edsel Silvano BROCKS, RN  Head of Bed [facility]) Positioning: HOB at 30-45 degrees  Taken 04/21/2024 0200 by Edsel Silvano BROCKS, RN  Head of Bed [facility]) Positioning: HOB at 30-45 degrees  Taken 04/21/2024 0000 by Edsel Silvano BROCKS, RN  Head of Bed [facility]) Positioning: HOB at 30-45 degrees  Taken 04/20/2024 2200 by Edsel Silvano BROCKS, RN  Head of Bed [facility]) Positioning: HOB at 30-45 degrees  Taken 04/20/2024 2000 by Edsel Silvano BROCKS, RN  Pressure Reduction Techniques:   heels elevated off bed   weight shift assistance provided  Head of Bed (HOB) Positioning: HOB at 30-45 degrees  Pressure Reduction Devices:   heel offloading device utilized   positioning supports utilized   pressure-redistributing mattress utilized   specialty bed utilized  Skin Protection:   adhesive use limited   incontinence pads utilized   skin-to-device areas padded   skin-to-skin areas padded   transparent dressing maintained   tubing/devices free from skin contact     Problem: Mechanical Ventilation Invasive  Goal: Optimal Device Function  Intervention: Optimize Device Care and Function  Recent Flowsheet Documentation  Taken 04/21/2024 0400 by Edsel Silvano BROCKS, RN  Oral Care:   mouth swabbed   suction provided  Taken 04/21/2024 0000 by Edsel Silvano BROCKS, RN  Oral Care:   mouth swabbed   suction provided  Taken 04/20/2024 2000 by Edsel Silvano BROCKS, RN  Oral Care:   mouth swabbed   suction provided   teeth brushed  Goal: Absence of Device-Related Skin and Tissue Injury  Intervention: Maintain Skin and Tissue Health  Recent Flowsheet Documentation  Taken 04/20/2024 2000 by Edsel Silvano  C, RN  Device Skin Pressure Protection:   absorbent pad utilized/changed   adhesive use limited   positioning supports utilized   pressure points protected  Goal: Absence of Ventilator-Induced Lung Injury  Intervention: Prevent Ventilator-Associated Pneumonia  Recent Flowsheet Documentation  Taken 04/21/2024 0400 by Edsel Silvano BROCKS, RN  Head of Bed [facility]) Positioning: HOB at 30-45 degrees  Oral Care:   mouth swabbed   suction provided  Taken 04/21/2024 0200 by Edsel Silvano BROCKS, RN  Head of Bed [facility]) Positioning: HOB at 30-45 degrees  Taken 04/21/2024 0000 by Edsel Silvano BROCKS, RN  Head of Bed [facility]) Positioning: HOB at 30-45 degrees  Oral Care:   mouth swabbed   suction provided  Taken 04/20/2024 2200 by Edsel Silvano BROCKS, RN  Head of Bed [facility]) Positioning: HOB at 30-45 degrees  Taken 04/20/2024 2000 by Edsel Silvano BROCKS, RN  Head of Bed [facility]) Positioning: HOB at 30-45 degrees  Oral Care:   mouth swabbed   suction provided   teeth brushed     Problem: Mechanical Ventilation Invasive  Goal: Optimal Device Function  Intervention: Optimize Device Care and Function  Recent Flowsheet Documentation  Taken 04/21/2024 0400 by Edsel Silvano BROCKS, RN  Oral Care:   mouth swabbed   suction provided  Taken 04/21/2024 0000 by Edsel Silvano BROCKS, RN  Oral Care:   mouth swabbed   suction provided  Taken 04/20/2024 2000 by Edsel Silvano BROCKS, RN  Oral Care:   mouth swabbed   suction provided   teeth brushed  Goal: Absence of Device-Related Skin and Tissue Injury  Intervention: Maintain Skin and Tissue Health  Recent Flowsheet Documentation  Taken 04/20/2024 2000 by Edsel Silvano BROCKS, RN  Device Skin Pressure Protection:   absorbent pad utilized/changed   adhesive use limited   positioning supports utilized   pressure points protected  Goal: Absence of Ventilator-Induced Lung Injury  Intervention: Prevent Ventilator-Associated Pneumonia  Recent Flowsheet Documentation  Taken 04/21/2024 0400 by Edsel Silvano BROCKS, RN  Head of Bed [facility]) Positioning: HOB at 30-45 degrees  Oral Care:   mouth swabbed   suction provided  Taken 04/21/2024 0200 by Edsel Silvano BROCKS, RN  Head of Bed [facility]) Positioning: HOB at 30-45 degrees  Taken 04/21/2024 0000 by Edsel Silvano BROCKS, RN  Head of Bed [facility]) Positioning: HOB at 30-45 degrees  Oral Care:   mouth swabbed   suction provided  Taken 04/20/2024 2200 by Edsel Silvano BROCKS, RN  Head of Bed [facility]) Positioning: HOB at 30-45 degrees  Taken 04/20/2024 2000 by Edsel Silvano BROCKS, RN  Head of Bed [facility]) Positioning: HOB at 30-45 degrees  Oral Care:   mouth swabbed   suction provided   teeth brushed     Problem: Mechanical Ventilation Invasive  Goal: Optimal Device Function  Intervention: Optimize Device Care and Function  Recent Flowsheet Documentation  Taken 04/21/2024 0400 by Edsel Silvano BROCKS, RN  Oral Care:   mouth swabbed   suction provided  Taken 04/21/2024 0000 by Edsel Silvano BROCKS, RN  Oral Care: mouth swabbed   suction provided  Taken 04/20/2024 2000 by Edsel Silvano BROCKS, RN  Oral Care:   mouth swabbed   suction provided   teeth brushed  Goal: Absence of Device-Related Skin and Tissue Injury  Intervention: Maintain Skin and Tissue Health  Recent Flowsheet Documentation  Taken 04/20/2024 2000 by Edsel Silvano BROCKS, RN  Device Skin Pressure Protection:   absorbent pad utilized/changed   adhesive use limited   positioning supports utilized  pressure points protected  Goal: Absence of Ventilator-Induced Lung Injury  Intervention: Prevent Ventilator-Associated Pneumonia  Recent Flowsheet Documentation  Taken 04/21/2024 0400 by Edsel Silvano BROCKS, RN  Head of Bed [facility]) Positioning: HOB at 30-45 degrees  Oral Care:   mouth swabbed   suction provided  Taken 04/21/2024 0200 by Edsel Silvano BROCKS, RN  Head of Bed [facility]) Positioning: HOB at 30-45 degrees  Taken 04/21/2024 0000 by Edsel Silvano BROCKS, RN  Head of Bed [facility]) Positioning: HOB at 30-45 degrees  Oral Care:   mouth swabbed   suction provided  Taken 04/20/2024 2200 by Edsel Silvano BROCKS, RN  Head of Bed [facility]) Positioning: HOB at 30-45 degrees  Taken 04/20/2024 2000 by Edsel Silvano BROCKS, RN  Head of Bed [name]) Positioning: HOB at 30-45 degrees  Oral Care:   mouth swabbed   suction provided   teeth brushed     Problem: Mechanical Ventilation Invasive  Goal: Optimal Device Function  Intervention: Optimize Device Care and Function  Recent Flowsheet Documentation  Taken 04/21/2024 0400 by Edsel Silvano BROCKS, RN  Oral Care:   mouth swabbed   suction provided  Taken 04/21/2024 0000 by Edsel Silvano BROCKS, RN  Oral Care:   mouth swabbed   suction provided  Taken 04/20/2024 2000 by Edsel Silvano BROCKS, RN  Oral Care:   mouth swabbed   suction provided   teeth brushed  Goal: Absence of Device-Related Skin and Tissue Injury  Intervention: Maintain Skin and Tissue Health  Recent Flowsheet Documentation  Taken 04/20/2024 2000 by Edsel Silvano BROCKS, RN  Device Skin Pressure Protection:   absorbent pad utilized/changed   adhesive use limited   positioning supports utilized   pressure points protected  Goal: Absence of Ventilator-Induced Lung Injury  Intervention: Prevent Ventilator-Associated Pneumonia  Recent Flowsheet Documentation  Taken 04/21/2024 0400 by Edsel Silvano BROCKS, RN  Head of Bed [facility]) Positioning: HOB at 30-45 degrees  Oral Care:   mouth swabbed   suction provided  Taken 04/21/2024 0200 by Edsel Silvano BROCKS, RN  Head of Bed [facility]) Positioning: HOB at 30-45 degrees  Taken 04/21/2024 0000 by Edsel Silvano BROCKS, RN  Head of Bed [facility]) Positioning: HOB at 30-45 degrees  Oral Care:   mouth swabbed   suction provided  Taken 04/20/2024 2200 by Edsel Silvano BROCKS, RN  Head of Bed [facility]) Positioning: HOB at 30-45 degrees  Taken 04/20/2024 2000 by Edsel Silvano BROCKS, RN  Head of Bed [facility]) Positioning: HOB at 30-45 degrees  Oral Care:   mouth swabbed   suction provided   teeth brushed     Problem: Infection  Goal: Absence of Infection Signs and Symptoms  Intervention: Prevent or Manage Infection  Recent Flowsheet Documentation  Taken 04/20/2024 2000 by Edsel Silvano BROCKS, RN  Infection Management: aseptic technique maintained     Problem: Mechanical Ventilation Invasive  Goal: Optimal Device Function  Intervention: Optimize Device Care and Function  Recent Flowsheet Documentation  Taken 04/21/2024 0400 by Edsel Silvano BROCKS, RN  Oral Care:   mouth swabbed   suction provided  Taken 04/21/2024 0000 by Edsel Silvano BROCKS, RN  Oral Care:   mouth swabbed   suction provided  Taken 04/20/2024 2000 by Edsel Silvano BROCKS, RN  Oral Care:   mouth swabbed   suction provided   teeth brushed  Goal: Absence of Device-Related Skin and Tissue Injury  Intervention: Maintain Skin and Tissue Health  Recent Flowsheet Documentation  Taken 04/20/2024 2000 by Edsel Silvano BROCKS, RN  Device Skin  Pressure Protection:   absorbent pad utilized/changed   adhesive use limited   positioning supports utilized   pressure points protected  Goal: Absence of Ventilator-Induced Lung Injury  Intervention: Prevent Ventilator-Associated Pneumonia  Recent Flowsheet Documentation  Taken 04/21/2024 0400 by Edsel Silvano BROCKS, RN  Head of Bed [facility]) Positioning: HOB at 30-45 degrees  Oral Care:   mouth swabbed   suction provided  Taken 04/21/2024 0200 by Edsel Silvano BROCKS, RN  Head of Bed [facility]) Positioning: HOB at 30-45 degrees  Taken 04/21/2024 0000 by Edsel Silvano BROCKS, RN  Head of Bed [facility]) Positioning: HOB at 30-45 degrees  Oral Care:   mouth swabbed   suction provided  Taken 04/20/2024 2200 by Edsel Silvano BROCKS, RN  Head of Bed [facility]) Positioning: HOB at 30-45 degrees  Taken 04/20/2024 2000 by Edsel Silvano BROCKS, RN  Head of Bed [facility]) Positioning: HOB at 30-45 degrees  Oral Care:   mouth swabbed   suction provided   teeth brushed     Problem: Mechanical Ventilation Invasive  Goal: Optimal Device Function  Intervention: Optimize Device Care and Function  Recent Flowsheet Documentation  Taken 04/21/2024 0400 by Edsel Silvano BROCKS, RN  Oral Care:   mouth swabbed   suction provided  Taken 04/21/2024 0000 by Edsel Silvano BROCKS, RN  Oral Care:   mouth swabbed   suction provided  Taken 04/20/2024 2000 by Edsel Silvano BROCKS, RN  Oral Care:   mouth swabbed   suction provided   teeth brushed  Goal: Absence of Device-Related Skin and Tissue Injury  Intervention: Maintain Skin and Tissue Health  Recent Flowsheet Documentation  Taken 04/20/2024 2000 by Edsel Silvano BROCKS, RN  Device Skin Pressure Protection:   absorbent pad utilized/changed   adhesive use limited   positioning supports utilized   pressure points protected  Goal: Absence of Ventilator-Induced Lung Injury  Intervention: Prevent Ventilator-Associated Pneumonia  Recent Flowsheet Documentation  Taken 04/21/2024 0400 by Edsel Silvano BROCKS, RN  Head of Bed [facility]) Positioning: HOB at 30-45 degrees  Oral Care:   mouth swabbed   suction provided  Taken 04/21/2024 0200 by Edsel Silvano BROCKS, RN  Head of Bed [facility]) Positioning: HOB at 30-45 degrees  Taken 04/21/2024 0000 by Edsel Silvano BROCKS, RN  Head of Bed [facility]) Positioning: HOB at 30-45 degrees  Oral Care:   mouth swabbed   suction provided  Taken 04/20/2024 2200 by Edsel Silvano BROCKS, RN  Head of Bed [facility]) Positioning: HOB at 30-45 degrees  Taken 04/20/2024 2000 by Edsel Silvano BROCKS, RN  Head of Bed [facility]) Positioning: HOB at 30-45 degrees  Oral Care:   mouth swabbed   suction provided   teeth brushed     Problem: Artificial Airway  Goal: Optimal Device Function  Intervention: Optimize Device Care and Function  Recent Flowsheet Documentation  Taken 04/21/2024 0400 by Edsel Silvano BROCKS, RN  Oral Care:   mouth swabbed   suction provided  Taken 04/21/2024 0000 by Edsel Silvano BROCKS, RN  Oral Care:   mouth swabbed   suction provided  Taken 04/20/2024 2000 by Edsel Silvano BROCKS, RN  Aspiration Precautions:   oral hygiene care promoted   respiratory status monitored   tube feeding placement verified   upright posture maintained  Oral Care:   mouth swabbed   suction provided   teeth brushed  Goal: Absence of Device-Related Skin or Tissue Injury  Intervention: Maintain Skin and Tissue Health  Recent Flowsheet Documentation  Taken 04/20/2024 2000 by Edsel Silvano BROCKS,  RN  Device Skin Pressure Protection:   absorbent pad utilized/changed   adhesive use limited   positioning supports utilized   pressure points protected

## 2024-04-21 NOTE — Procedures (Addendum)
 DAILY PRELIMINARY INTERIM LONGTERM VIDEO EEG MONITORING NOTE    Identifying Information   NAME: [name]    MRN: [id number]   DOB: March 09, 1946    Ordering Provider:  Velma Hamilton Deen   LOC: 4326/4326-01    Study Information  EEG Start: 04/18/24 at 1700  EEG End: 11/**/2025 at TBD        HISTORY: 78 y.o. female with history of HFpEF, afib, and MASLD cirrhosis admitted with respiratory failure, hepatic encephalopathy, and septic shock course with course c/b hypernatremia. Consult for c/f seizure.   MRI brain 11/16. -Diffuse cortical restricted diffusion greatest within the bilateral parieto-occipital regions.     INDICATION:  Evaluate for Electrographic Seizures and Seizures     PATIENT STATE: Somnolent    EEG TECHNICAL DESCRIPTION   Conditions of Recording:  Continuous EEG with simultaneous video recording was performed utilizing 21 active electrodes placed according to the international 10-20 system.  The study was recorded digitally with a bandpass of 1-70Hz  and a sampling rate of 200Hz  and was reviewed with the possibility of multiple reformatting.  The study was digitally processed with potential spike and seizure events identified for physician analysis and review.  Patient recognized events were identified by a push button marker and reviewed by the physician. Simultaneous video was reviewed for all patient events.    DAY 1 EEG DESCRIPTION: 11/17725  at 1700 to 04/19/2024 at 0430 Winfield Nora, MD)   Disconnected for MRI   Relevant Medications: levetiracetam (Keppra) 750 mg BID  Loaded with Keppra @1620   Versed  give @2009     Background:  Background is continuous initially without any clear PDR  There was a loss of background organization. Instead, the background was characterized by normal (>20 to <150 uV) diffuse 2-5 hz theta delta slowing. There was reactivity to stimulation.    There was not transition to sleep  .  From 6 pm onwards the background became more discontinuous with intermittent periodic discharge -->burst attenuation >suppression lasting from 2-10 seconds.  The burst were posterior dominant high amplitude sharply contoured wave with faster activity lasting for 0.5 -1 second with inter burst interval varying from 1 -5 seconds.  Though towards the end of the study the discharges were at times like generalized periodic discharges        Interictal Epileptiform Activity:  There were bilaterally independent/generalized continuous epileptiform periodic patterns noted in posterior quadrant at 1 Hz.The discharge were complex of moderate to high amplitude sharply contoured waves with faster activity [LPD +F]    Ictal Activity:  The  interval between periodic waves occasionally  became continuous and rhythmic with sharply contoured theta waves with superadded fast activity  with spread to anterior frontocentral chains and can occur independently on left and right side.  At times the pattern lasted less than <10 seconds suggestive of BRIEF POTENTIALLY ICTAL RHYTHMIC DISCHARGES (BIRDs)  After versed  at 2009 the background remained discontinuous,though ictal evolution was seen till 1:00 am  Electrographic seizures #1@1 :06am  The burst become continuous evolve in frequency to 2-3 Hz with intermixed faster frequency and lasts for  10 12 seconds and is better evolved on left side  No clinical features- provider was bedside  There were at least 6 electrographic seizures from left posterior quadrant from 4:00-4:30 am from left posterior quadrant without any clinical correlate       DAY 2 EEG DESCRIPTION: 11/18725  at 04:30 to 04/20/2024 at 0430 Winfield Nora, MD)     Relevant Medications: levetiracetam (Keppra)  750 mg BID  Loaded with Keppra @1620   Versed  give @2009   Vimpat    Background:  Background remains disorganized with no clear PDR.It comprises of posterior dominant high amplitude sharp waves complex with superadded fast  1 hz.[Generalized periodic discharges] The interdischarge interval of 1-2 sec consists of low moderate amplitude delta waves  No sleep transients    Ictal pattern  BIRDS -multiple--> reduced as the study progressed  Left posterior quadrant electrographic seizure  Last of the ictal pattern seen at 1007    DAY 2 EEG DESCRIPTION: 11/19725  at 04:30 to 04/21/2024 at 0430 Winfield Nora, MD)     Relevant Medications: levetiracetam (Keppra) 750 mg BID  Loaded with Keppra @1620   Versed  give @2009   Vimpat    Background:  Background remains disorganized with no clear PDR.It comprises of posterior quadrant high amplitude sharp waves complex with superadded fast at 1 hz.[bilaterally synchronous lateralized periodic discharges] The interdischarge interval of 1-2 sec consists of low moderate amplitude delta waves  No sleep transients    Ictal pattern  No ictal pattern seen    EEG SUMMARY INTERPRETATION:   This is an abnormal EEG due to:  Posterior quadrant lateralized periodic discharge at 1 Hz  Disorganized slow background  No ictal pattern since 10 am on 11/18          CLINICAL CORRELATION / SUMMARY of RECORDING:   These findings are consistent with     a non-specific moderate degree of encephalopathy.     Focal epileptiform potentials can be seen in patients with partial-onset seizures, but do not in themselves confirm a diagnosis of seizures or epilepsy. and Lateralized periodic discharges (LPDs) can be associated with HSV encephalitis, infarction, focal seizures, and other focal or lateralized cerebral pathologies. At the very least, they indicate an elevated risk of focal onset seizures.   Interpreting Provider: Maryfrances Nora, MD    2HELPS2B Seizure Risk Score  Clinical risk score based on EEG findings and clinical history of seizures to aid in determination of optimal duration of EEG monitoring for detection of electrographic seizures.  Score has not been validated in patients under age 88 or following cardiac arrest.    Lateralized / Bilateral Independent Periodic Discharges or Lateralized Rhythmic Delta Activity - 1 point  and Brief Ictal Appearing Rhythmic Discharges (BIRDs) - 2 points    Add 1 point if known history of epilepsy or prior clinical seizure.      Risk Group     Seizure Risk   at 72 hours   Duration of Monitoring    Seizure risk < 5%     Duration of Monitoring    Seizure risk < 2%       Low Risk,   Score = 0     3.1%   1 Hour   3.3 Hours     Medium Risk,   Score = 1     12%   12 Hours   29 Hours     High Risk,   Score = 2 or greater                >25%   >24 Hours   >30 Hours   Struck et al JAMA Neurology, January 2020    Not appropriate for EEG monitoring being performed for the following:  Treatment of status epilepticus or seizures already documented on EEG  Monitoring sedation/ burst suppression for management of intracranial pressure and/or paralyzed patients  Diagnostic evaluation of transient episodes concerning  for possible seizures (spell capture)   Patients s/p cardiac arrest undergoing targeted temperature management      Amon dunker al. American Clinical Neurophysiology Society's Standardized Critical Care EEG Terminology: 2021 Version. Journal of Clinical Neurophysiology 38(1):p 1-29, January 2021.

## 2024-04-22 LAB — CBC W/ AUTO DIFF
BASOPHILS ABSOLUTE COUNT: 0 10*9/L (ref 0.0–0.1)
BASOPHILS RELATIVE PERCENT: 0.6 %
EOSINOPHILS ABSOLUTE COUNT: 0.1 10*9/L (ref 0.0–0.5)
EOSINOPHILS RELATIVE PERCENT: 3 %
HEMATOCRIT: 26.3 % — ABNORMAL LOW (ref 34.0–44.0)
HEMOGLOBIN: 8.6 g/dL — ABNORMAL LOW (ref 11.3–14.9)
LYMPHOCYTES ABSOLUTE COUNT: 1.2 10*9/L (ref 1.1–3.6)
LYMPHOCYTES RELATIVE PERCENT: 23.3 %
MEAN CORPUSCULAR HEMOGLOBIN CONC: 32.6 g/dL (ref 32.0–36.0)
MEAN CORPUSCULAR HEMOGLOBIN: 32.2 pg (ref 25.9–32.4)
MEAN CORPUSCULAR VOLUME: 99 fL — ABNORMAL HIGH (ref 77.6–95.7)
MEAN PLATELET VOLUME: 9.3 fL (ref 6.8–10.7)
MONOCYTES ABSOLUTE COUNT: 0.7 10*9/L (ref 0.3–0.8)
MONOCYTES RELATIVE PERCENT: 13.5 %
NEUTROPHILS ABSOLUTE COUNT: 3 10*9/L (ref 1.8–7.8)
NEUTROPHILS RELATIVE PERCENT: 59.6 %
PLATELET COUNT: 166 10*9/L (ref 150–450)
RED BLOOD CELL COUNT: 2.65 10*12/L — ABNORMAL LOW (ref 3.95–5.13)
RED CELL DISTRIBUTION WIDTH: 19.9 % — ABNORMAL HIGH (ref 12.2–15.2)
WBC ADJUSTED: 5 10*9/L (ref 3.6–11.2)

## 2024-04-22 LAB — HEPATIC FUNCTION PANEL
ALBUMIN: 2.4 g/dL — ABNORMAL LOW (ref 3.4–5.0)
ALKALINE PHOSPHATASE: 172 U/L — ABNORMAL HIGH (ref 46–116)
ALT (SGPT): 13 U/L (ref 10–49)
AST (SGOT): 32 U/L (ref <=34)
BILIRUBIN DIRECT: 0.4 mg/dL — ABNORMAL HIGH (ref 0.00–0.30)
BILIRUBIN TOTAL: 0.7 mg/dL (ref 0.3–1.2)
PROTEIN TOTAL: 5.5 g/dL — ABNORMAL LOW (ref 5.7–8.2)

## 2024-04-22 LAB — BLOOD GAS, VENOUS
BASE EXCESS VENOUS: 1.4 (ref -2.0–2.0)
BASE EXCESS VENOUS: 0.9 (ref -2.0–2.0)
CARBOXYHEMOGLOBIN, VENOUS: 1.7 % — ABNORMAL HIGH (ref <1.2)
CARBOXYHEMOGLOBIN, VENOUS: 1.6 % — ABNORMAL HIGH (ref <1.2)
HCO3 VENOUS: 26 mmol/L (ref 22–27)
HCO3 VENOUS: 26 mmol/L (ref 22–27)
METHEMOGLOBIN, VENOUS: <1 % (ref <1.5)
O2 SATURATION VENOUS: 74.6 % (ref 40.0–85.0)
O2 SATURATION VENOUS: 63.8 % (ref 40.0–85.0)
OXYHEMOGLOBIN, VENOUS: 73 % (ref 40.0–85.0)
OXYHEMOGLOBIN, VENOUS: 62.7 % (ref 40.0–85.0)
PCO2 VENOUS: 43 mmHg (ref 40–60)
PCO2 VENOUS: 43 mmHg (ref 40–60)
PH VENOUS: 7.39 (ref 7.32–7.43)
PH VENOUS: 7.39 (ref 7.32–7.43)
PO2 VENOUS: 46 mmHg (ref 30–55)
PO2 VENOUS: 35 mmHg (ref 30–55)

## 2024-04-22 LAB — APTT
APTT: 68.3 s — ABNORMAL HIGH (ref 24.8–38.4)
HEPARIN CORRELATION: 0.4

## 2024-04-22 LAB — BASIC METABOLIC PANEL
ANION GAP: 13 mmol/L (ref 5–14)
BLOOD UREA NITROGEN: 21 mg/dL (ref 9–23)
BUN / CREAT RATIO: 54
CALCIUM: 8.5 mg/dL — ABNORMAL LOW (ref 8.7–10.4)
CHLORIDE: 111 mmol/L — ABNORMAL HIGH (ref 98–107)
CO2: 25 mmol/L (ref 20.0–31.0)
CREATININE: 0.39 mg/dL — ABNORMAL LOW (ref 0.55–1.02)
EGFR CKD-EPI (2021) FEMALE: >90 mL/min/1.73m2 (ref >=60)
GLUCOSE RANDOM: 128 mg/dL (ref 70–179)
POTASSIUM: 3.5 mmol/L (ref 3.4–4.8)
SODIUM: 149 mmol/L — ABNORMAL HIGH (ref 135–145)

## 2024-04-22 LAB — MAGNESIUM: MAGNESIUM: 2.1 mg/dL (ref 1.6–2.6)

## 2024-04-22 LAB — CYSTATIN C
CYSTATIN C: 1.63 mg/L — ABNORMAL HIGH (ref 0.64–1.23)
EGFR CKD-EPI (2012) CYSTATIN C FEMALE: 35 mL/min/1.73m2 — ABNORMAL LOW (ref >=60)

## 2024-04-22 LAB — PHOSPHORUS: PHOSPHORUS: 2.6 mg/dL (ref 2.4–5.1)

## 2024-04-22 MED ADMIN — sodium chloride (NS) 0.9 % flush 10 mL: 10 mL | INTRAVENOUS | @ 07:00:00

## 2024-04-22 MED ADMIN — sodium chloride (NS) 0.9 % flush 10 mL: 10 mL | INTRAVENOUS | @ 23:00:00

## 2024-04-22 MED ADMIN — sodium chloride (NS) 0.9 % flush 10 mL: 10 mL | INTRAVENOUS | @ 17:00:00

## 2024-04-22 MED ADMIN — rifAXIMin (XIFAXAN) oral suspension: 550 mg | GASTROENTERAL | @ 14:00:00 | Stop: 2025-04-11

## 2024-04-22 MED ADMIN — rifAXIMin (XIFAXAN) oral suspension: 550 mg | GASTROENTERAL | @ 02:00:00 | Stop: 2025-04-11

## 2024-04-22 MED ADMIN — folic acid (FOLVITE) tablet 1 mg: 1 mg | GASTROENTERAL | @ 14:00:00

## 2024-04-22 MED ADMIN — metoPROLOL tartrate (Lopressor) tablet 25 mg: 25 mg | GASTROENTERAL | @ 02:00:00

## 2024-04-22 MED ADMIN — metoPROLOL tartrate (Lopressor) tablet 25 mg: 25 mg | GASTROENTERAL | @ 15:00:00

## 2024-04-22 MED ADMIN — lactulose oral solution: 30 g | GASTROENTERAL | @ 19:00:00

## 2024-04-22 MED ADMIN — lactulose oral solution: 30 g | GASTROENTERAL | @ 23:00:00

## 2024-04-22 MED ADMIN — lactulose oral solution: 30 g | GASTROENTERAL | @ 15:00:00

## 2024-04-22 MED ADMIN — lactulose oral solution: 30 g | GASTROENTERAL | @ 02:00:00

## 2024-04-22 MED ADMIN — lactulose oral solution: 30 g | GASTROENTERAL | @ 11:00:00

## 2024-04-22 MED ADMIN — thiamine mononitrate (vit B1) tablet 100 mg: 100 mg | GASTROENTERAL | @ 14:00:00

## 2024-04-22 MED ADMIN — ergocalciferol (DRISDOL) oral drops 200 mcg/mL (8,000 unit/mL): 100 ug | GASTROENTERAL | @ 14:00:00 | Stop: 2024-05-21

## 2024-04-22 MED ADMIN — cyanocobalamin (vitamin B-12) tablet 1,000 mcg: 1000 ug | GASTROENTERAL | @ 14:00:00

## 2024-04-22 MED ADMIN — lacosamide (VIMPAT) injection 100 mg: 100 mg | INTRAVENOUS | @ 15:00:00

## 2024-04-22 MED ADMIN — lacosamide (VIMPAT) injection 100 mg: 100 mg | INTRAVENOUS | @ 02:00:00

## 2024-04-22 MED ADMIN — famotidine (PEPCID) tablet 20 mg: 20 mg | GASTROENTERAL | @ 14:00:00 | Stop: 2024-04-22

## 2024-04-22 MED ADMIN — famotidine (PEPCID) tablet 20 mg: 20 mg | GASTROENTERAL | @ 02:00:00

## 2024-04-22 MED ADMIN — melatonin tablet 3 mg: 3 mg | GASTROENTERAL | @ 23:00:00

## 2024-04-22 MED ADMIN — furosemide (LASIX) injection 40 mg: 40 mg | INTRAVENOUS | @ 23:00:00 | Stop: 2024-04-22

## 2024-04-22 MED ADMIN — zinc acetate oral solution: 50 mg | GASTROENTERAL | @ 23:00:00 | Stop: 2024-05-06

## 2024-04-22 MED ADMIN — esomeprazole (NEXIUM) granules 40 mg: 40 mg | GASTROENTERAL | @ 14:00:00

## 2024-04-22 MED ADMIN — multivitamins, therapeutic with minerals tablet 1 tablet: 1 | GASTROENTERAL | @ 14:00:00

## 2024-04-22 MED ADMIN — levETIRAcetam (KEPPRA) injection 1,000 mg: 1000 mg | INTRAVENOUS | @ 02:00:00

## 2024-04-22 MED ADMIN — cupric chloride (copper) 2 mg in sodium chloride (NS) 0.9 % 250 mL IVPB: 2 mg | INTRAVENOUS | @ 17:00:00 | Stop: 2024-04-27

## 2024-04-22 MED ADMIN — heparin 25,000 Units/250 mL (100 units/mL) in 0.45% saline infusion (premade): 0-24 [IU]/kg/h | INTRAVENOUS | @ 19:00:00

## 2024-04-22 MED ADMIN — potassium chloride 20 mEq in 100 mL IVPB Premix: 20 meq | INTRAVENOUS | @ 10:00:00 | Stop: 2024-04-22

## 2024-04-22 MED ADMIN — potassium chloride 20 mEq in 100 mL IVPB Premix: 20 meq | INTRAVENOUS | @ 12:00:00 | Stop: 2024-04-22

## 2024-04-22 MED ADMIN — levETIRAcetam (KEPPRA) injection 750 mg: 750 mg | INTRAVENOUS | @ 15:00:00

## 2024-04-22 NOTE — Plan of Care (Signed)
 UTA pt orientation. Unable to follow commands, RASS -3. EVSS. On vent 30%. Afebrile. No c/o of pain. Purewick in place, URO diminished. FMS in place, robust output. Pt remains on TF at goal. Q2H turns and standard precautions maintained. No falls/injuries this shift. Heparin  gtt maintained. See MAR/flowsheets for further information.     Problem: Skin Injury Risk Increased  Goal: Skin Health and Integrity  Outcome: Shift Focus  Intervention: Optimize Skin Protection  Recent Flowsheet Documentation  Taken 04/22/2024 0600 by Edsel Silvano BROCKS, RN  Head of Bed [facility]) Positioning: HOB at 30-45 degrees  Taken 04/22/2024 0400 by Edsel Silvano BROCKS, RN  Head of Bed [facility]) Positioning: HOB at 30-45 degrees  Taken 04/22/2024 0000 by Edsel Silvano BROCKS, RN  Head of Bed [facility]) Positioning: HOB at 30-45 degrees  Taken 04/21/2024 2200 by Edsel Silvano BROCKS, RN  Head of Bed [facility]) Positioning: HOB at 30-45 degrees  Taken 04/21/2024 2000 by Edsel Silvano BROCKS, RN  Pressure Reduction Techniques:   heels elevated off bed   weight shift assistance provided  Head of Bed (HOB) Positioning: HOB at 30-45 degrees  Pressure Reduction Devices:   heel offloading device utilized   positioning supports utilized   pressure-redistributing mattress utilized  Skin Protection:   adhesive use limited   incontinence pads utilized   skin-to-device areas padded   skin-to-skin areas padded   transparent dressing maintained   tubing/devices free from skin contact     Problem: Adult Inpatient Plan of Care  Goal: Absence of Hospital-Acquired Illness or Injury  Outcome: Shift Focus  Intervention: Identify and Manage Fall Risk  Recent Flowsheet Documentation  Taken 04/21/2024 2000 by Edsel Silvano BROCKS, RN  Safety Interventions:   aspiration precautions   bariatric safety   bed alarm   bleeding precautions   enteral feeding safety   fall reduction program maintained   no IV/BP/blood draw left arm   room near unit station  Intervention: Prevent Skin Injury  Recent Flowsheet Documentation  Taken 04/22/2024 0600 by Edsel Silvano BROCKS, RN  Positioning for Skin: Left  Taken 04/22/2024 0400 by Edsel Silvano BROCKS, RN  Positioning for Skin: Right  Taken 04/22/2024 0200 by Edsel Silvano BROCKS, RN  Positioning for Skin: Left  Taken 04/22/2024 0000 by Edsel Silvano BROCKS, RN  Positioning for Skin: Right  Taken 04/21/2024 2200 by Edsel Silvano BROCKS, RN  Positioning for Skin: Left  Taken 04/21/2024 2000 by Edsel Silvano BROCKS, RN  Positioning for Skin: Right  Device Skin Pressure Protection:   absorbent pad utilized/changed   adhesive use limited   positioning supports utilized   pressure points protected  Skin Protection:   adhesive use limited   incontinence pads utilized   skin-to-device areas padded   skin-to-skin areas padded   transparent dressing maintained   tubing/devices free from skin contact  Intervention: Prevent Infection  Recent Flowsheet Documentation  Taken 04/21/2024 2000 by Edsel Silvano BROCKS, RN  Infection Prevention:   hand hygiene promoted   personal protective equipment utilized   rest/sleep promoted   single patient room provided     Problem: Comorbidity Management  Goal: Blood Pressure in Desired Range  Outcome: Shift Focus     Problem: Mechanical Ventilation Invasive  Goal: Optimal Device Function  Outcome: Shift Focus  Intervention: Optimize Device Care and Function  Recent Flowsheet Documentation  Taken 04/22/2024 0400 by Edsel Silvano BROCKS, RN  Oral Care:   mouth swabbed   suction provided  Taken 04/22/2024 0000 by Edsel Silvano BROCKS, RN  Oral Care:   mouth swabbed   suction provided  Taken 04/21/2024 2000 by Edsel Silvano BROCKS, RN  Oral Care:   mouth swabbed   suction provided   teeth brushed  Goal: Mechanical Ventilation Liberation  Outcome: Shift Focus     Problem: Breathing Pattern Ineffective  Goal: Effective Breathing Pattern  Intervention: Promote Improved Breathing Pattern  Recent Flowsheet Documentation  Taken 04/22/2024 0600 by Edsel Silvano BROCKS, RN  Head of Bed [facility]) Positioning: HOB at 30-45 degrees  Taken 04/22/2024 0400 by Edsel Silvano BROCKS, RN  Head of Bed [facility]) Positioning: HOB at 30-45 degrees  Taken 04/22/2024 0000 by Edsel Silvano BROCKS, RN  Head of Bed [facility]) Positioning: HOB at 30-45 degrees  Taken 04/21/2024 2200 by Edsel Silvano BROCKS, RN  Head of Bed [facility]) Positioning: HOB at 30-45 degrees  Taken 04/21/2024 2000 by Edsel Silvano BROCKS, RN  Head of Bed [facility]) Positioning: HOB at 30-45 degrees     Problem: Fall Injury Risk  Goal: Absence of Fall and Fall-Related Injury  Intervention: Promote Injury-Free Environment  Recent Flowsheet Documentation  Taken 04/21/2024 2000 by Edsel Silvano BROCKS, RN  Safety Interventions:   aspiration precautions   bariatric safety   bed alarm   bleeding precautions   enteral feeding safety   fall reduction program maintained   no IV/BP/blood draw left arm   room near unit station     Problem: Gas Exchange Impaired  Goal: Optimal Gas Exchange  Intervention: Optimize Oxygenation and Ventilation  Recent Flowsheet Documentation  Taken 04/22/2024 0600 by Edsel Silvano BROCKS, RN  Head of Bed [facility]) Positioning: HOB at 30-45 degrees  Taken 04/22/2024 0400 by Edsel Silvano BROCKS, RN  Head of Bed [facility]) Positioning: HOB at 30-45 degrees  Taken 04/22/2024 0000 by Edsel Silvano BROCKS, RN  Head of Bed [facility]) Positioning: HOB at 30-45 degrees  Taken 04/21/2024 2200 by Edsel Silvano BROCKS, RN  Head of Bed [facility]) Positioning: HOB at 30-45 degrees  Taken 04/21/2024 2000 by Edsel Silvano BROCKS, RN  Head of Bed [facility]) Positioning: HOB at 30-45 degrees     Problem: Wound  Goal: Absence of Infection Signs and Symptoms  Intervention: Prevent or Manage Infection  Recent Flowsheet Documentation  Taken 04/21/2024 2000 by Edsel Silvano BROCKS, RN  Infection Management: aseptic technique maintained  Goal: Skin Health and Integrity  Intervention: Optimize Skin Protection  Recent Flowsheet Documentation  Taken 04/22/2024 0600 by Edsel Silvano BROCKS, RN  Head of Bed [facility]) Positioning: HOB at 30-45 degrees  Taken 04/22/2024 0400 by Edsel Silvano BROCKS, RN  Head of Bed [facility]) Positioning: HOB at 30-45 degrees  Taken 04/22/2024 0000 by Edsel Silvano BROCKS, RN  Head of Bed [facility]) Positioning: HOB at 30-45 degrees  Taken 04/21/2024 2200 by Edsel Silvano BROCKS, RN  Head of Bed [facility]) Positioning: HOB at 30-45 degrees  Taken 04/21/2024 2000 by Edsel Silvano BROCKS, RN  Pressure Reduction Techniques:   heels elevated off bed   weight shift assistance provided  Head of Bed (HOB) Positioning: HOB at 30-45 degrees  Pressure Reduction Devices:   heel offloading device utilized   positioning supports utilized   pressure-redistributing mattress utilized  Skin Protection:   adhesive use limited   incontinence pads utilized   skin-to-device areas padded   skin-to-skin areas padded   transparent dressing maintained   tubing/devices free from skin contact     Problem: Mechanical Ventilation Invasive  Goal: Optimal Device Function  Intervention: Optimize Device Care and Function  Recent Flowsheet Documentation  Taken 04/22/2024 0400 by Edsel Silvano BROCKS, RN  Oral Care:   mouth swabbed   suction provided  Taken 04/22/2024 0000 by Edsel Silvano BROCKS, RN  Oral Care:   mouth swabbed   suction provided  Taken 04/21/2024 2000 by Edsel Silvano BROCKS, RN  Oral Care:   mouth swabbed   suction provided   teeth brushed  Goal: Absence of Device-Related Skin and Tissue Injury  Intervention: Maintain Skin and Tissue Health  Recent Flowsheet Documentation  Taken 04/21/2024 2000 by Edsel Silvano BROCKS, RN  Device Skin Pressure Protection:   absorbent pad utilized/changed   adhesive use limited   positioning supports utilized   pressure points protected  Goal: Absence of Ventilator-Induced Lung Injury  Intervention: Prevent Ventilator-Associated Pneumonia  Recent Flowsheet Documentation  Taken 04/22/2024 0600 by Edsel Silvano BROCKS, RN  Head of Bed [facility]) Positioning: HOB at 30-45 degrees  Taken 04/22/2024 0400 by Edsel Silvano BROCKS, RN  Head of Bed [facility]) Positioning: HOB at 30-45 degrees  Oral Care:   mouth swabbed   suction provided  Taken 04/22/2024 0000 by Edsel Silvano BROCKS, RN  Head of Bed [facility]) Positioning: HOB at 30-45 degrees  Oral Care:   mouth swabbed   suction provided  Taken 04/21/2024 2200 by Edsel Silvano BROCKS, RN  Head of Bed [facility]) Positioning: HOB at 30-45 degrees  Taken 04/21/2024 2000 by Edsel Silvano BROCKS, RN  Head of Bed [facility]) Positioning: HOB at 30-45 degrees  Oral Care:   mouth swabbed   suction provided   teeth brushed     Problem: Mechanical Ventilation Invasive  Goal: Optimal Device Function  Intervention: Optimize Device Care and Function  Recent Flowsheet Documentation  Taken 04/22/2024 0400 by Edsel Silvano BROCKS, RN  Oral Care:   mouth swabbed   suction provided  Taken 04/22/2024 0000 by Edsel Silvano BROCKS, RN  Oral Care:   mouth swabbed   suction provided  Taken 04/21/2024 2000 by Edsel Silvano BROCKS, RN  Oral Care:   mouth swabbed   suction provided   teeth brushed  Goal: Absence of Device-Related Skin and Tissue Injury  Intervention: Maintain Skin and Tissue Health  Recent Flowsheet Documentation  Taken 04/21/2024 2000 by Edsel Silvano BROCKS, RN  Device Skin Pressure Protection:   absorbent pad utilized/changed   adhesive use limited   positioning supports utilized   pressure points protected  Goal: Absence of Ventilator-Induced Lung Injury  Intervention: Prevent Ventilator-Associated Pneumonia  Recent Flowsheet Documentation  Taken 04/22/2024 0600 by Edsel Silvano BROCKS, RN  Head of Bed [facility]) Positioning: HOB at 30-45 degrees  Taken 04/22/2024 0400 by Edsel Silvano BROCKS, RN  Head of Bed [facility]) Positioning: HOB at 30-45 degrees  Oral Care:   mouth swabbed   suction provided  Taken 04/22/2024 0000 by Edsel Silvano BROCKS, RN  Head of Bed [facility]) Positioning: HOB at 30-45 degrees  Oral Care:   mouth swabbed   suction provided  Taken 04/21/2024 2200 by Edsel Silvano BROCKS, RN  Head of Bed [facility]) Positioning: HOB at 30-45 degrees  Taken 04/21/2024 2000 by Edsel Silvano BROCKS, RN  Head of Bed [facility]) Positioning: HOB at 30-45 degrees  Oral Care:   mouth swabbed   suction provided   teeth brushed     Problem: Mechanical Ventilation Invasive  Goal: Absence of Device-Related Skin and Tissue Injury  Intervention: Maintain Skin and Tissue Health  Recent Flowsheet Documentation  Taken 04/21/2024 2000 by Edsel Silvano BROCKS, RN  Device Skin Pressure Protection:   absorbent  pad utilized/changed   adhesive use limited   positioning supports utilized   pressure points protected  Goal: Absence of Ventilator-Induced Lung Injury  Intervention: Prevent Ventilator-Associated Pneumonia  Recent Flowsheet Documentation  Taken 04/22/2024 0600 by Edsel Silvano BROCKS, RN  Head of Bed [facility]) Positioning: HOB at 30-45 degrees  Taken 04/22/2024 0400 by Edsel Silvano BROCKS, RN  Head of Bed [facility]) Positioning: HOB at 30-45 degrees  Oral Care:   mouth swabbed   suction provided  Taken 04/22/2024 0000 by Edsel Silvano BROCKS, RN  Head of Bed [facility]) Positioning: HOB at 30-45 degrees  Oral Care:   mouth swabbed   suction provided  Taken 04/21/2024 2200 by Edsel Silvano BROCKS, RN  Head of Bed [facility]) Positioning: HOB at 30-45 degrees  Taken 04/21/2024 2000 by Edsel Silvano BROCKS, RN  Head of Bed [facility]) Positioning: HOB at 30-45 degrees  Oral Care:   mouth swabbed   suction provided   teeth brushed     Problem: Mechanical Ventilation Invasive  Goal: Optimal Device Function  Intervention: Optimize Device Care and Function  Recent Flowsheet Documentation  Taken 04/22/2024 0400 by Edsel Silvano BROCKS, RN  Oral Care:   mouth swabbed   suction provided  Taken 04/22/2024 0000 by Edsel Silvano BROCKS, RN  Oral Care:   mouth swabbed   suction provided  Taken 04/21/2024 2000 by Edsel Silvano BROCKS, RN  Oral Care:   mouth swabbed   suction provided   teeth brushed  Goal: Absence of Device-Related Skin and Tissue Injury  Intervention: Maintain Skin and Tissue Health  Recent Flowsheet Documentation  Taken 04/21/2024 2000 by Edsel Silvano BROCKS, RN  Device Skin Pressure Protection:   absorbent pad utilized/changed   adhesive use limited   positioning supports utilized   pressure points protected  Goal: Absence of Ventilator-Induced Lung Injury  Intervention: Prevent Ventilator-Associated Pneumonia  Recent Flowsheet Documentation  Taken 04/22/2024 0600 by Edsel Silvano BROCKS, RN  Head of Bed [facility]) Positioning: HOB at 30-45 degrees  Taken 04/22/2024 0400 by Edsel Silvano BROCKS, RN  Head of Bed [facility]) Positioning: HOB at 30-45 degrees  Oral Care:   mouth swabbed   suction provided  Taken 04/22/2024 0000 by Edsel Silvano BROCKS, RN  Head of Bed [facility]) Positioning: HOB at 30-45 degrees  Oral Care:   mouth swabbed   suction provided  Taken 04/21/2024 2200 by Edsel Silvano BROCKS, RN  Head of Bed [facility]) Positioning: HOB at 30-45 degrees  Taken 04/21/2024 2000 by Edsel Silvano BROCKS, RN  Head of Bed [facility]) Positioning: HOB at 30-45 degrees  Oral Care:   mouth swabbed   suction provided   teeth brushed     Problem: Infection  Goal: Absence of Infection Signs and Symptoms  Intervention: Prevent or Manage Infection  Recent Flowsheet Documentation  Taken 04/21/2024 2000 by Edsel Silvano BROCKS, RN  Infection Management: aseptic technique maintained     Problem: Mechanical Ventilation Invasive  Goal: Optimal Device Function  Intervention: Optimize Device Care and Function  Recent Flowsheet Documentation  Taken 04/22/2024 0400 by Edsel Silvano BROCKS, RN  Oral Care:   mouth swabbed   suction provided  Taken 04/22/2024 0000 by Edsel Silvano BROCKS, RN  Oral Care:   mouth swabbed   suction provided  Taken 04/21/2024 2000 by Edsel Silvano BROCKS, RN  Oral Care:   mouth swabbed   suction provided   teeth brushed  Goal: Absence of Device-Related Skin and Tissue Injury  Intervention: Maintain Skin and Tissue Health  Recent  Flowsheet Documentation  Taken 04/21/2024 2000 by Edsel Silvano BROCKS, RN  Device Skin Pressure Protection:   absorbent pad utilized/changed   adhesive use limited   positioning supports utilized   pressure points protected  Goal: Absence of Ventilator-Induced Lung Injury  Intervention: Prevent Ventilator-Associated Pneumonia  Recent Flowsheet Documentation  Taken 04/22/2024 0600 by Edsel Silvano BROCKS, RN  Head of Bed [facility]) Positioning: HOB at 30-45 degrees  Taken 04/22/2024 0400 by Edsel Silvano BROCKS, RN  Head of Bed [facility]) Positioning: HOB at 30-45 degrees  Oral Care:   mouth swabbed   suction provided  Taken 04/22/2024 0000 by Edsel Silvano BROCKS, RN  Head of Bed [facility]) Positioning: HOB at 30-45 degrees  Oral Care:   mouth swabbed   suction provided  Taken 04/21/2024 2200 by Edsel Silvano BROCKS, RN  Head of Bed [facility]) Positioning: HOB at 30-45 degrees  Taken 04/21/2024 2000 by Edsel Silvano BROCKS, RN  Head of Bed [facility]) Positioning: HOB at 30-45 degrees  Oral Care:   mouth swabbed   suction provided   teeth brushed     Problem: Mechanical Ventilation Invasive  Goal: Optimal Device Function  Intervention: Optimize Device Care and Function  Recent Flowsheet Documentation  Taken 04/22/2024 0400 by Edsel Silvano BROCKS, RN  Oral Care:   mouth swabbed   suction provided  Taken 04/22/2024 0000 by Edsel Silvano BROCKS, RN  Oral Care:   mouth swabbed   suction provided  Taken 04/21/2024 2000 by Edsel Silvano BROCKS, RN  Oral Care:   mouth swabbed   suction provided   teeth brushed  Goal: Absence of Device-Related Skin and Tissue Injury  Intervention: Maintain Skin and Tissue Health  Recent Flowsheet Documentation  Taken 04/21/2024 2000 by Edsel Silvano BROCKS, RN  Device Skin Pressure Protection:   absorbent pad utilized/changed   adhesive use limited   positioning supports utilized   pressure points protected  Goal: Absence of Ventilator-Induced Lung Injury  Intervention: Prevent Ventilator-Associated Pneumonia  Recent Flowsheet Documentation  Taken 04/22/2024 0600 by Edsel Silvano BROCKS, RN  Head of Bed [facility]) Positioning: HOB at 30-45 degrees  Taken 04/22/2024 0400 by Edsel Silvano BROCKS, RN  Head of Bed [facility]) Positioning: HOB at 30-45 degrees  Oral Care:   mouth swabbed   suction provided  Taken 04/22/2024 0000 by Edsel Silvano BROCKS, RN  Head of Bed [facility]) Positioning: HOB at 30-45 degrees  Oral Care:   mouth swabbed   suction provided  Taken 04/21/2024 2200 by Edsel Silvano BROCKS, RN  Head of Bed [facility]) Positioning: HOB at 30-45 degrees  Taken 04/21/2024 2000 by Edsel Silvano BROCKS, RN  Head of Bed [facility]) Positioning: HOB at 30-45 degrees  Oral Care:   mouth swabbed   suction provided   teeth brushed     Problem: Artificial Airway  Goal: Optimal Device Function  Intervention: Optimize Device Care and Function  Recent Flowsheet Documentation  Taken 04/22/2024 0400 by Edsel Silvano BROCKS, RN  Oral Care:   mouth swabbed   suction provided  Taken 04/22/2024 0000 by Edsel Silvano BROCKS, RN  Oral Care:   mouth swabbed   suction provided  Taken 04/21/2024 2000 by Edsel Silvano BROCKS, RN  Aspiration Precautions:   oral hygiene care promoted   respiratory status monitored   tube feeding placement verified   upright posture maintained  Oral Care:   mouth swabbed   suction provided   teeth brushed  Goal: Absence of Device-Related Skin or Tissue Injury  Intervention: Maintain Skin and  Tissue Health  Recent Flowsheet Documentation  Taken 04/21/2024 2000 by Edsel Silvano BROCKS, RN  Device Skin Pressure Protection:   absorbent pad utilized/changed   adhesive use limited   positioning supports utilized   pressure points protected

## 2024-04-22 NOTE — Plan of Care (Signed)
 Patient remained on PSV-CPAP for shift. Attempted to [facility], not successful, increased WOB and tachypneic. Titrated the support to 12 to help with low tidal volumes. Tolerated to change with no adverse affects. Trach care completed, airway remains patent, secure, and no breakdown noted. Emergency equipment at the bedside.    Problem: Mechanical Ventilation Invasive  Goal: Effective Communication  Outcome: Ongoing - Unchanged  Goal: Optimal Device Function  Outcome: Ongoing - Unchanged  Intervention: Optimize Device Care and Function  Recent Flowsheet Documentation  Taken 04/22/2024 1445 by Heron Connors, RRT  Airway/Ventilation Management:   airway patency maintained   humidification applied   pulmonary hygiene promoted  Taken 04/22/2024 9176 by Heron Connors, RRT  Airway/Ventilation Management:   airway patency maintained   humidification applied   pulmonary hygiene promoted  Oral Care:   mouth swabbed   oral rinse provided   suction provided   teeth brushed   tongue brushed  Goal: Mechanical Ventilation Liberation  Outcome: Ongoing - Unchanged  Goal: Optimal Nutrition Delivery  Outcome: Ongoing - Unchanged  Goal: Absence of Device-Related Skin and Tissue Injury  Outcome: Ongoing - Unchanged  Goal: Absence of Ventilator-Induced Lung Injury  Outcome: Ongoing - Unchanged  Intervention: Prevent Ventilator-Associated Pneumonia  Recent Flowsheet Documentation  Taken 04/22/2024 1445 by Heron, Verma Grothaus, RRT  Head of Bed [facility]) Positioning: HOB at 30-45 degrees  VAP Prevention Bundle:   HOB elevation maintained   oral care regularly provided   vent circuit breaks minimized  Taken 04/22/2024 9176 by Heron, Jessi Jessop, RRT  Head of Bed [facility]) Positioning: HOB at 30-45 degrees  VAP Prevention Bundle:   HOB elevation maintained   oral care regularly provided   vent circuit breaks minimized  Oral Care:   mouth swabbed   oral rinse provided   suction provided   teeth brushed   tongue brushed     Problem: Artificial Airway  Goal: Effective Communication  Outcome: Ongoing - Unchanged  Goal: Optimal Device Function  Outcome: Ongoing - Unchanged  Intervention: Optimize Device Care and Function  Recent Flowsheet Documentation  Taken 04/22/2024 1445 by Heron Connors, RRT  Airway/Ventilation Management:   airway patency maintained   humidification applied   pulmonary hygiene promoted  Taken 04/22/2024 0823 by Xyla Leisner, RRT  Airway/Ventilation Management:   airway patency maintained   humidification applied   pulmonary hygiene promoted  Oral Care:   mouth swabbed   oral rinse provided   suction provided   teeth brushed   tongue brushed  Goal: Absence of Device-Related Skin or Tissue Injury  Outcome: Ongoing - Unchanged

## 2024-04-22 NOTE — Consults (Signed)
 Patient Name:  [name]       Medical Record Number: [id number]   Date of Birth: 04/17/1946  Sex: Female            Assessment  Consult received and appreciated for speaking valve. Planned to see today, discussed with clinical specialist RT this am with plan to reach out once on high flow trach collar. RT later communicated that pt did poorly on high flow trach collar, only lasting several minutes, and given poor mental status today recommended holding speaking valve trial until Monday - agreed. ST and RT also discussed that as needed speaking valve could be trialed on the vent if transition to trach collar not successful. Will plan to follow up Monday.    Direct Patient Care Nonbillable Minutes: 10    Speech Therapy Session Duration  SLP Individual [mins]: 0    I attest that I have reviewed the above information.  Signed: Almarie JONELLE Gilbert, CCC-SLP  Filed 04/22/2024

## 2024-04-22 NOTE — Progress Notes (Signed)
 MICU Daily Progress Note     Date of Service: 04/22/2024    Problem List:   Principal Problem:    Bacteremia, escherichia coli  Active Problems:    Alcoholic liver disease (HHS-HCC)    HTN (hypertension)    Depression with anxiety    Class 2 severe obesity with serious comorbidity in adult    Atrial fibrillation    (CMS-HCC)    Pleural effusion    Hypernatremia    Thrombocytopenia    Cirrhosis    (CMS-HCC)    A-fib (CMS-HCC)    Hepatic encephalopathy    (CMS-HCC)    Anaphylaxis      Interval history: [name] is a 78 y.o. female with HFpEF, HTN, Afib on AC, MASLD cirrhosis, originally hospitalized for scheduled TEE/DCCV (aborted d/t LAA clot) c/b AHHRF, encephalopathy and septic shock requiring MICU care, was s/p extubation transferred to [facility] for further management of anticoagulation and hypernatremia. Transferred back to [facility] MICU for closer monitoring for respiratory status, pressor requirement, GI bleed, and management of carotid artery injury.     On 10/24, patient had acute worsening of respiratory status leading requiring intubation. When patient was given sedation for intubation, she coded and promptly had ROSC after a few minutes of CPR. Subsequently, CVC placed in neck, used to draw blood, unclear if given pressors through it, then found  to be in arterial system based on blood gas and imaging, likely right carotid. Line was subsequently removed, and pressure was held for 15 minutes, hemostasis with no hematoma. She was transferred to [facility] MICU for management of this carotid injury, to be evaluated by vascular surgery while also managing her increased vasopressor requirement and respiratory distress.  Has been weaned off pressors since 10/27 at 1 PM. VBG's indicating that she is ready for extubation, however her mentation suggests that she is not able to protect her airway.  She was extubated on 10/31, and mentation slowly improved.  Unfortunately on the morning of 11/2 she had increased lethargy, increased secretions, and she was not protecting her airway.  She was reintubated on 11/2, and required pressor support with norepinephrine  and vasopressin  after the induction agents.  Self extubated on 11/5. Re intubated on 11/6 due to hypercarbia and increased work of breathing.  We are left with the challenging options regarding the care of [name].  Due to her multiple failed extubations, we are concerned [name] will need a tracheostomy long-term to protect her airway.  There is a meeting with palliative care and family on 04/13/2024, her family agreed for tracheostomy.  Tracheostomy placed 11/14.  On 11/16, patient started presenting with altered mental status as she became hard to arouse and stopped following commands.  Sepsis workup was initiated.  She also had a lateral gaze on exam, so neurology was consulted.  EEG showed seizure activity and MRI brain showed diffuse cortical restricted diffusion greatest within the bilateral parietal -occipital regions.  Her ammonia level is also elevated to 291.  It is unclear if seizures are related to her ammonia level.  Patient placed on Keppra  and Vimpat .    Plans for family meeting next week to discuss goals of care.  Palliative is involved.    24 hour events:   - continue lactulose  30g q4h   - palliative care following; goal to facilitate family meeting week of 11/23  - renal dose reduction for keppra : 1g --> 750mg  BID  - trial high flow trach collar     Neurological  Hepatic encephalopathy - Non-Convulsive Seizures   Rapid response called on 10/24 at [facility] for somnolence. Ddx  hepatic encephalopathy, toxic metabolic encephalopathy in the setting of shock (possibly sepsis), intracranial pathology. Intubated.  No acute abnormality on 10/24 head CT. Failed extubation attempts and tracheostomy placed on 11/14. Overnight on 04/18/2024, patient noted to have eye deviation along with decreased responsiveness on physical exam.   Head CT reassuring.  MRI brain with diffuse cortical restricted diffusion greatest within the bilateral parieto-occipital regions, unclear etiology. 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 Neurology consulted.  EEG with evidence of seizure activity, therefore started on Keppra  and vimpat . Ammonia significantly elevated. Clinical status changed in the setting of missed lactulose  doses (due to copious stool output) - suspect possible hyperammonemia leading to seizures. Some improvement of mental status with resuming lactulose  and adequate stool output.   - Neurology following, appreciate recommendations  - Plan to obtain MRI brain in 2 weeks (~12/4)   - Lactulose  30g q4h  - Rifaximin  550 mg twice daily   - Continue Keppra  750 mg twice daily (renally dosed)  - Continue Vimpat  100 mg twice daily  - Continue to monitor mental status  - Goals of care meeting hopefully next week with family, palliative on board     Analgesia: No pain issues  RASS at goal? N/A, not on sedation  [place] Agitation Assessment Scale (RASS) : -3 (04/22/2024  6:00 AM)    Pulmonary   Acute Hypoxic Hypercarbic Respiratory Failure   Rapid response called on 10/24 for increased somnolence, found to have acute hypercarbic respiratory failure and subsequently intubated. Suspect hypercarbic respiratory failure due to encephalopathy as above. CXR on 10/24 showed worsening pleural effusions. Given patient's hypoxia, and AMS, there was concern for infectious etiology.  Infectious workup overall negative. Unable to protect her airway due to her current mentation.  Reintubated on 11/2 followed by therapeutic bronchoscopy.  Self extubated on 11/5.  Reintubate noted on 11/6 due to hypercarbia and not protecting her airway. Trach placed 11/14, complicated by anaphylactic reaction to rocuronium  which was treated with epi, benadryl  and steroids. Allergy added to chart.  First trach change completed on 04/20/2024 by ENT.  - Continue mechanical ventilation, weaning as tolerated  - Trial of high flow trach collar as tolerated  -  VBG q12h  Vent Mode: PSV-CPAP  FiO2 (%): 30 %  S RR: 15  PEEP: 5 cm H20  PR SUP: 10 cm H20       Cardiovascular   Carotid Artery Injury (resolved)  At [facility], CVC placed intended for RIJ on 10/24 following intubation, PEA, ROSC. Was used with known blood return. Unclear if it was used for pressor support, but highly likely that it was. CXR for placement check showed concern for intra aterial location, and blood gas confirmed it was arterial. Thought to be in carotid artery. CVC removed with pressure held for 15 minutes, with no hematoma formation. On arrival, no physical exam and bedside ultrasound showed no large hematoma. No active bleeding.  Vascular Surgery consulted, stated fistula has healed based on Ultrasound findings.  - CTM    PEA arrest s/p ROSC  Hypovolemic/Hemorrhagic shock (resolved)  Atrial fibrillation  known left atrial appendage clot  HFpEF  Patient with PEA arrest peri-intubation with ROSC. Shock thought to be hemorrhagic in the setting of GI bleed History of atrial fibrillation with known LAA clot and HFpEF. Vasopressors were weaned off 10/27. APTT was elevated the morning of 10/28 necessitating changing anticoagulation from  therapeutic heparin  to therapeutic Lovenox  .   -Metoprolol  25 mg twice daily      Renal   Abnormal Electrolytes - Stable Hypernatremia  At [facility], had persistently hypernatremia near 153. Had been treated with D5 gtt. Sodium throughout admission has largely been 146-148. Sodium has overall normalized and continues to be within normal limits.  - Strict I/O's  - BMP daily    Infectious Disease/Autoimmune   E. Coli Bacteremia (resolved),   Initially admitted for afib, found to have E. coli bacteremia on October 10. This was treated with cefazolin . Rapid response called 10/24, patient noted to be hypothermic. WBCs jumped from 3.0 to 9.7 on 10/24. Although the most recent lab was following intubation and CPR. UA with pyuria, rare bacteria, hematuria. Peri-intubation, patient developed significant hypotension requiring initiation of NE and vasopressin . Suspect possible septic shock with unknown infectious source. Patient started on vancomycin  and cefepime  10/24. CT Abdomen pelvis concerning for aspiration pneumonia. Following infectious workup was unremarkable. Antibiotics were stopped on 10/27.  Infectious workup was repeated on 11/3 due to reintubation and status post bronchoscopy.  Completed course of ceftriaxone .  -CTM    Cultures:  Blood Culture, Routine (no units)   Date Value   04/17/2024 No Growth at 4 days   04/17/2024 No Growth at 4 days     Urine Culture, Comprehensive (no units)   Date Value   04/17/2024 NO GROWTH   04/09/2024 NO GROWTH     Lower Respiratory Culture (no units)   Date Value   04/10/2024 2+ Methicillin-Susceptible Staphylococcus aureus (A)   04/10/2024 1+ Oropharyngeal Flora Isolated     WBC (10*9/L)   Date Value   04/22/2024 5.0     WBC, UA (/HPF)   Date Value   04/17/2024 1       FEN/GI   Sigmoid Mass c/f Malignancy and Hematochezia (resolved)  Hemorrhagic Shock (resolved)  CT abdomen pelvis done 10/24 with evidence of cirrhosis and masslike thickening of the lower sigmoid colon concerning for malignancy.  CTA abdomen pelvis performed 1024 with active extravasation into the gastric fundus possibly secondary to traumatic enteric tube placement.  GI scoped the patient on 10/25 and clipped an area of active bleeding.  They removed a total of 1.5 L of blood.  Hemoglobin 10/20 6 AM downtrended to 6.0, so 2 units of blood transfused.  GI stated this was consistent with 10/25 scope and likely did not represent increased bleeding. Hb steadily improving with Hb of 9.2 on 10/28.   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 Given shock of unclear etiology. CTAP demonstrating cirrhosis. Likely that cirrhosis was contributing to hypotension.  Worsening altered mental status with seizure-like activity in the setting of hyperammonemia after missing 2-3 lactulose  doses due to copious stool output.  Evidence of seizure activity on EEG.  Mental status with slow improvement once lactulose  was resumed.  -Continue lactulose  30 g every 4 hours  - Goal stool output 2-4 L daily  - Daily MELD labs and CMP    Vitamin Deficiencies  Low vitamin D , copper , and zinc .  -Appreciate dietitian recommendations  - Continue tube feeds (currently at goal)  - Zinc  repletion  - Multivitamin 1 tablet daily  - Folic acid  1 mg daily  - Copper  2 mg IV  -  Vitamin B12 1000 mcg daily     MELD 3.0: 15 at 04/05/2024  4:31 AM  MELD-Na: 13 at 04/05/2024  4:31 AM  Calculated from:  Serum Creatinine: 0.43 mg/dL (Using min of 1 mg/dL) at 88/09/7972  5:68 AM  Serum Sodium: 143 mmol/L (Using max of 137 mmol/L) at 04/05/2024  4:31 AM  Total Bilirubin: 1.3 mg/dL at 88/09/7972  5:68 AM  Serum Albumin : 2.4 g/dL at 88/09/7972  5:68 AM  INR(ratio): 1.61 at 04/03/2024 10:26 AM  Age at listing (hypothetical): 27 years  Sex: Female at 04/05/2024  4:31 AM    Provider Malnutrition Assessment:  Body mass index is 41 kg/m??. BMI Interpretation: >/= 30 and < 40, consistent with obesity, clinically significant requiring additional resources and complicating multiple aspects of patient care.  GLIM criteria:   Pt does not meet criteria  -I have screened this patient for malnutrition and they did NOT meet criteria for malnutrition based on GLIM criteria.  -TF and FWF; Dietician consulted, appreciate assistance    Heme/Coag   Acute blood loss anemia  Hemoglobin down trended to 6.0 on the morning of 10/26 thought to be secondary to acute GI bleed that was scoped and clipped 10/25.  H/H stable.  -CBC daily  - Maintain active T&S    Endocrine   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)   - home anastrozole     Integumentary   NAI   #  - WOCN consulted for high risk skin assessment No. Reason: Not indicated.    Prophylaxis/LDA/Restraints/Consults   ICU Checklist completed: yes (see ICU rounding navigator in Epic)    Patient Lines/Drains/Airways Status       Active Active Lines, Drains, & Airways       Name Placement date Placement time Site Days    Tracheostomy Shiley 6 04/20/24  1430  6  1    NG/OG Tube Feedings 10 Fr. Right nostril 04/01/24  1138  Right nostril  20    External Urinary Device 04/13/24 With Suction 04/13/24  1700  -- 8    PICC Double Lumen 04/06/24 Left Basilic 04/06/24  1558  Basilic  15    Peripheral IV 88/79/74 Right Wrist 04/21/24  0830  Wrist  less than 1                  Patient Lines/Drains/Airways Status       Active Wounds       Name Placement date Placement time Site Days    Wound 03/13/24 Irritant Contact Dermatitis Incontinence Sacrum Mid gluteal cleft MASD? 03/13/24  --  Sacrum  40                  Goals of Care     Code Status:   Orders Placed This Encounter   Procedures    Full Code     Standing Status:   Standing     Number of Occurrences:   1        Designated Healthcare Decision Maker:  Ms. Cavan designated healthcare decision maker(s) is/are   HCDM (patient stated preference): Danielson,[name] - Spouse - [phone]    HCDM (patient stated preference): Verbrugge,[name] - Daughter - [phone]. See HCDM section of Epic sidebar/storyboard or ACP tab in patient chart for details regarding active HCDMs and patient capacity for decision-making.      Subjective     Intubated.  Withdraws to pain. Opens eyes to stimulation.  Unable  to reliably follow commands.    Objective     Vitals - past 24 hours  Temp:  [36.7 ??C (98.1 ??F)-37.1 ??C (98.8 ??F)] 36.7 ??C (98.1 ??F)  Pulse:  [86-113] 103  SpO2 Pulse:  [83-111] 107  Resp:  [15-37] 15  BP: (96-142)/(33-79) 133/65  FiO2 (%):  [30 %-35 %] 30 %  SpO2:  [96 %-100 %] 99 % Intake/Output  I/O last 3 completed shifts:  In: 5965.8 [P.O.:180; I.V.:430.8; NG/GT:5255; IV Piggyback:100]  Out: 5125 [Urine:900; Stool:4225]     Physical Exam:    General: Intubated.   HEENT: normocephalic, atraumatic.  Trach in place.   CV: pulses palpable, regular borderline tachycardia   Pulm: Rhonchi w transmitted upper airway sounds throughout  GI: soft, NTND, + BS  MSK: Bilateral LE pitting edema  Skin: Moist skin breakdown around trach.  Neuro: Withdrawals to painful stimuli.  Not following commands.  Reflexes intact    Continuous Infusions:   Infusions Meds[1]    Scheduled Medications:   Scheduled Medications[2]    PRN medications:  PRN Medications[3]    Data/Imaging Review: Reviewed in Epic and personally interpreted on 04/22/2024. See EMR for detailed results.         [1]    heparin  11 Units/kg/hr (04/21/24 1344)    NORepinephrine  bitartrate-NS Stopped (04/19/24 0050)   [2]    cyanocobalamin  (vitamin B-12)  1,000 mcg Enteral tube: gastric Daily    ergocalciferol   100 mcg Enteral tube: gastric Daily    esomeprazole   40 mg Enteral tube: gastric Daily    famotidine   20 mg Enteral tube: gastric BID    flu vac 2025 [id number](PF)  0.5 mL Intramuscular During hospitalization    folic acid   1 mg Enteral tube: gastric Daily    lacosamide   100 mg Intravenous BID    lactulose   30 g Enteral tube: gastric Q4H    levETIRAcetam   750 mg Intravenous Q12H SCH    melatonin  3 mg Enteral tube: gastric QPM    metoPROLOL  tartrate  25 mg Enteral tube: gastric BID    multivitamins (ADULT)  1 tablet Enteral tube: gastric Daily    rifAXIMin   550 mg Enteral tube: gastric BID    sodium chloride   10 mL Intravenous Q8H    sodium chloride   10 mL Intravenous Q8H    thiamine  mononitrate (vit B1)  100 mg Enteral tube: gastric Daily   [3] acetaminophen , heparin  (porcine)

## 2024-04-22 NOTE — Plan of Care (Signed)
 Problem: Skin Injury Risk Increased  Goal: Skin Health and Integrity  Outcome: Shift Focus  Intervention: Optimize Skin Protection  Recent Flowsheet Documentation  Taken 04/22/2024 1600 by Riva Redell FALCON, RN  Head of Bed [facility]) Positioning: HOB at 30-45 degrees  Taken 04/22/2024 1200 by Riva Redell FALCON, RN  Head of Bed [facility]) Positioning: HOB at 30-45 degrees  Taken 04/22/2024 0800 by Riva Redell FALCON, RN  Pressure Reduction Techniques:   heels elevated off bed   weight shift assistance provided  Head of Bed (HOB) Positioning: HOB at 30-45 degrees  Pressure Reduction Devices:   heel offloading device utilized   positioning supports utilized   pressure-redistributing mattress utilized  Skin Protection:   adhesive use limited   incontinence pads utilized   skin-to-device areas padded   skin-to-skin areas padded   transparent dressing maintained   tubing/devices free from skin contact     Problem: Adult Inpatient Plan of Care  Goal: Optimal Comfort and Wellbeing  Outcome: Shift Focus

## 2024-04-23 LAB — BASIC METABOLIC PANEL
ANION GAP: 10 mmol/L (ref 5–14)
ANION GAP: 9 mmol/L (ref 5–14)
BLOOD UREA NITROGEN: 19 mg/dL (ref 9–23)
BLOOD UREA NITROGEN: 17 mg/dL (ref 9–23)
BUN / CREAT RATIO: 46
BUN / CREAT RATIO: 43
CALCIUM: 8.5 mg/dL — ABNORMAL LOW (ref 8.7–10.4)
CALCIUM: 8.3 mg/dL — ABNORMAL LOW (ref 8.7–10.4)
CHLORIDE: 111 mmol/L — ABNORMAL HIGH (ref 98–107)
CHLORIDE: 111 mmol/L — ABNORMAL HIGH (ref 98–107)
CO2: 26 mmol/L (ref 20.0–31.0)
CO2: 26 mmol/L (ref 20.0–31.0)
CREATININE: 0.41 mg/dL — ABNORMAL LOW (ref 0.55–1.02)
CREATININE: 0.4 mg/dL — ABNORMAL LOW (ref 0.55–1.02)
EGFR CKD-EPI (2021) FEMALE: >90 mL/min/1.73m2 (ref >=60)
EGFR CKD-EPI (2021) FEMALE: >90 mL/min/1.73m2 (ref >=60)
GLUCOSE RANDOM: 139 mg/dL (ref 70–179)
GLUCOSE RANDOM: 151 mg/dL (ref 70–179)
POTASSIUM: 3.4 mmol/L (ref 3.4–4.8)
POTASSIUM: 3.8 mmol/L (ref 3.4–4.8)
SODIUM: 147 mmol/L — ABNORMAL HIGH (ref 135–145)
SODIUM: 146 mmol/L — ABNORMAL HIGH (ref 135–145)

## 2024-04-23 LAB — CBC W/ AUTO DIFF
BASOPHILS ABSOLUTE COUNT: 0 10*9/L (ref 0.0–0.1)
BASOPHILS RELATIVE PERCENT: 0.7 %
EOSINOPHILS ABSOLUTE COUNT: 0.2 10*9/L (ref 0.0–0.5)
EOSINOPHILS RELATIVE PERCENT: 3.7 %
HEMATOCRIT: 25.9 % — ABNORMAL LOW (ref 34.0–44.0)
HEMOGLOBIN: 8.4 g/dL — ABNORMAL LOW (ref 11.3–14.9)
LYMPHOCYTES ABSOLUTE COUNT: 1.2 10*9/L (ref 1.1–3.6)
LYMPHOCYTES RELATIVE PERCENT: 27 %
MEAN CORPUSCULAR HEMOGLOBIN CONC: 32.6 g/dL (ref 32.0–36.0)
MEAN CORPUSCULAR HEMOGLOBIN: 32.1 pg (ref 25.9–32.4)
MEAN CORPUSCULAR VOLUME: 98.6 fL — ABNORMAL HIGH (ref 77.6–95.7)
MEAN PLATELET VOLUME: 9.1 fL (ref 6.8–10.7)
MONOCYTES ABSOLUTE COUNT: 0.6 10*9/L (ref 0.3–0.8)
MONOCYTES RELATIVE PERCENT: 13.9 %
NEUTROPHILS ABSOLUTE COUNT: 2.4 10*9/L (ref 1.8–7.8)
NEUTROPHILS RELATIVE PERCENT: 54.7 %
PLATELET COUNT: 171 10*9/L (ref 150–450)
RED BLOOD CELL COUNT: 2.62 10*12/L — ABNORMAL LOW (ref 3.95–5.13)
RED CELL DISTRIBUTION WIDTH: 19.7 % — ABNORMAL HIGH (ref 12.2–15.2)
WBC ADJUSTED: 4.4 10*9/L (ref 3.6–11.2)

## 2024-04-23 LAB — BLOOD GAS, VENOUS
BASE EXCESS VENOUS: 1.2 (ref -2.0–2.0)
BASE EXCESS VENOUS: 0.7 (ref -2.0–2.0)
CARBOXYHEMOGLOBIN, VENOUS: 1.8 % — ABNORMAL HIGH (ref <1.2)
CARBOXYHEMOGLOBIN, VENOUS: 1.7 % — ABNORMAL HIGH (ref <1.2)
HCO3 VENOUS: 26 mmol/L (ref 22–27)
HCO3 VENOUS: 25 mmol/L (ref 22–27)
METHEMOGLOBIN, VENOUS: <1 % (ref <1.5)
METHEMOGLOBIN, VENOUS: <1 % (ref <1.5)
O2 SATURATION VENOUS: 70.1 % (ref 40.0–85.0)
O2 SATURATION VENOUS: 63.9 % (ref 40.0–85.0)
OXYHEMOGLOBIN, VENOUS: 68.5 % (ref 40.0–85.0)
OXYHEMOGLOBIN, VENOUS: 62.5 % (ref 40.0–85.0)
PCO2 VENOUS: 44 mmHg (ref 40–60)
PCO2 VENOUS: 42 mmHg (ref 40–60)
PH VENOUS: 7.39 (ref 7.32–7.43)
PH VENOUS: 7.4 (ref 7.32–7.43)
PO2 VENOUS: 43 mmHg (ref 30–55)
PO2 VENOUS: 39 mmHg (ref 30–55)

## 2024-04-23 LAB — HEPATIC FUNCTION PANEL
ALBUMIN: 2.5 g/dL — ABNORMAL LOW (ref 3.4–5.0)
ALKALINE PHOSPHATASE: 172 U/L — ABNORMAL HIGH (ref 46–116)
ALT (SGPT): 14 U/L (ref 10–49)
AST (SGOT): 32 U/L (ref <=34)
BILIRUBIN DIRECT: 0.4 mg/dL — ABNORMAL HIGH (ref 0.00–0.30)
BILIRUBIN TOTAL: 0.7 mg/dL (ref 0.3–1.2)
PROTEIN TOTAL: 5.4 g/dL — ABNORMAL LOW (ref 5.7–8.2)

## 2024-04-23 LAB — NIACIN (VITAMIN B3)
NICOTINAMIDE: 26.7 ng/mL
NICOTINIC ACID (NIACIN): <5 ng/mL
NICOTINURIC ACID: <5 ng/mL

## 2024-04-23 LAB — MAGNESIUM: MAGNESIUM: 1.9 mg/dL (ref 1.6–2.6)

## 2024-04-23 LAB — CYSTATIN C
CYSTATIN C: 1.65 mg/L — ABNORMAL HIGH (ref 0.64–1.23)
EGFR CKD-EPI (2012) CYSTATIN C FEMALE: 35 mL/min/1.73m2 — ABNORMAL LOW (ref >=60)

## 2024-04-23 LAB — PHOSPHORUS: PHOSPHORUS: 2.4 mg/dL (ref 2.4–5.1)

## 2024-04-23 LAB — APTT
APTT: 276 s (ref 24.8–38.4)
APTT: 64.6 s — ABNORMAL HIGH (ref 24.8–38.4)
HEPARIN CORRELATION: 1.6
HEPARIN CORRELATION: 0.4

## 2024-04-23 MED ADMIN — sodium chloride (NS) 0.9 % flush 10 mL: 10 mL | INTRAVENOUS | @ 09:00:00

## 2024-04-23 MED ADMIN — sodium chloride (NS) 0.9 % flush 10 mL: 10 mL | INTRAVENOUS | @ 16:00:00

## 2024-04-23 MED ADMIN — rifAXIMin (XIFAXAN) oral suspension: 550 mg | GASTROENTERAL | @ 14:00:00 | Stop: 2025-04-11

## 2024-04-23 MED ADMIN — rifAXIMin (XIFAXAN) oral suspension: 550 mg | GASTROENTERAL | @ 03:00:00 | Stop: 2025-04-11

## 2024-04-23 MED ADMIN — folic acid (FOLVITE) tablet 1 mg: 1 mg | GASTROENTERAL | @ 14:00:00

## 2024-04-23 MED ADMIN — metoPROLOL tartrate (Lopressor) tablet 25 mg: 25 mg | GASTROENTERAL | @ 14:00:00

## 2024-04-23 MED ADMIN — metoPROLOL tartrate (Lopressor) tablet 25 mg: 25 mg | GASTROENTERAL | @ 03:00:00

## 2024-04-23 MED ADMIN — lactulose oral solution: 30 g | GASTROENTERAL | @ 14:00:00

## 2024-04-23 MED ADMIN — lactulose oral solution: 30 g | GASTROENTERAL | @ 10:00:00

## 2024-04-23 MED ADMIN — lactulose oral solution: 30 g | GASTROENTERAL | @ 03:00:00

## 2024-04-23 MED ADMIN — lactulose oral solution: 30 g | GASTROENTERAL | @ 23:00:00

## 2024-04-23 MED ADMIN — lactulose oral solution: 30 g | GASTROENTERAL | @ 20:00:00

## 2024-04-23 MED ADMIN — lactulose oral solution: 30 g | GASTROENTERAL | @ 07:00:00

## 2024-04-23 MED ADMIN — thiamine mononitrate (vit B1) tablet 100 mg: 100 mg | GASTROENTERAL | @ 14:00:00

## 2024-04-23 MED ADMIN — ergocalciferol (DRISDOL) oral drops 200 mcg/mL (8,000 unit/mL): 100 ug | GASTROENTERAL | @ 14:00:00 | Stop: 2024-05-21

## 2024-04-23 MED ADMIN — cyanocobalamin (vitamin B-12) tablet 1,000 mcg: 1000 ug | GASTROENTERAL | @ 14:00:00

## 2024-04-23 MED ADMIN — lacosamide (VIMPAT) injection 100 mg: 100 mg | INTRAVENOUS | @ 03:00:00

## 2024-04-23 MED ADMIN — lacosamide (VIMPAT) injection 100 mg: 100 mg | INTRAVENOUS | @ 14:00:00 | Stop: 2024-04-23

## 2024-04-23 MED ADMIN — famotidine (PEPCID) tablet 20 mg: 20 mg | GASTROENTERAL | @ 14:00:00

## 2024-04-23 MED ADMIN — melatonin tablet 3 mg: 3 mg | GASTROENTERAL | @ 23:00:00

## 2024-04-23 MED ADMIN — zinc acetate oral solution: 50 mg | GASTROENTERAL | @ 23:00:00 | Stop: 2024-05-06

## 2024-04-23 MED ADMIN — esomeprazole (NEXIUM) granules 40 mg: 40 mg | GASTROENTERAL | @ 14:00:00

## 2024-04-23 MED ADMIN — multivitamins, therapeutic with minerals tablet 1 tablet: 1 | GASTROENTERAL | @ 14:00:00

## 2024-04-23 MED ADMIN — cupric chloride (copper) 2 mg in sodium chloride (NS) 0.9 % 250 mL IVPB: 2 mg | INTRAVENOUS | @ 14:00:00 | Stop: 2024-04-27

## 2024-04-23 MED ADMIN — furosemide (LASIX) injection 40 mg: 40 mg | INTRAVENOUS | @ 23:00:00 | Stop: 2024-04-23

## 2024-04-23 MED ADMIN — heparin 25,000 Units/250 mL (100 units/mL) in 0.45% saline infusion (premade): 0-24 [IU]/kg/h | INTRAVENOUS | @ 15:00:00

## 2024-04-23 MED ADMIN — levETIRAcetam (KEPPRA) injection 750 mg: 750 mg | INTRAVENOUS | @ 03:00:00

## 2024-04-23 MED ADMIN — levETIRAcetam (KEPPRA) injection 750 mg: 750 mg | INTRAVENOUS | @ 14:00:00 | Stop: 2024-04-23

## 2024-04-23 MED ADMIN — furosemide (LASIX) injection 40 mg: 40 mg | INTRAVENOUS | @ 15:00:00 | Stop: 2024-04-23

## 2024-04-23 MED ADMIN — potassium chloride 20 mEq in 100 mL IVPB Premix: 20 meq | INTRAVENOUS | @ 15:00:00 | Stop: 2024-04-23

## 2024-04-23 MED ADMIN — potassium chloride 20 mEq in 100 mL IVPB Premix: 20 meq | INTRAVENOUS | @ 10:00:00 | Stop: 2024-04-23

## 2024-04-23 MED ADMIN — magnesium sulfate 2gm/50mL IVPB: 2 g | INTRAVENOUS | @ 11:00:00 | Stop: 2024-04-23

## 2024-04-23 NOTE — Plan of Care (Signed)
 Patient remained on PSV-CPAP with no changes made. Trach care completed, airway remains patent, secure, and no breakdown noted. Emergency equipment at the bedside.    Problem: Mechanical Ventilation Invasive  Goal: Effective Communication  Outcome: Ongoing - Unchanged  Goal: Optimal Device Function  Outcome: Ongoing - Unchanged  Intervention: Optimize Device Care and Function  Recent Flowsheet Documentation  Taken 04/23/2024 1430 by Heron Connors, RRT  Airway/Ventilation Management:   airway patency maintained   humidification applied   pulmonary hygiene promoted  Taken 04/23/2024 0830 by Heron, Crandall Harvel, RRT  Airway/Ventilation Management:   airway patency maintained   humidification applied   pulmonary hygiene promoted  Oral Care:   mouth swabbed   oral rinse provided   suction provided   teeth brushed   tongue brushed  Goal: Mechanical Ventilation Liberation  Outcome: Ongoing - Unchanged  Goal: Optimal Nutrition Delivery  Outcome: Ongoing - Unchanged  Goal: Absence of Device-Related Skin and Tissue Injury  Outcome: Ongoing - Unchanged  Goal: Absence of Ventilator-Induced Lung Injury  Outcome: Ongoing - Unchanged  Intervention: Prevent Ventilator-Associated Pneumonia  Recent Flowsheet Documentation  Taken 04/23/2024 1430 by Heron, Monty Spicher, RRT  Head of Bed [facility]) Positioning: HOB at 30-45 degrees  VAP Prevention Bundle:   HOB elevation maintained   oral care regularly provided   vent circuit breaks minimized  Taken 04/23/2024 0830 by Heron, Brexlee Heberlein, RRT  Head of Bed [facility]) Positioning: HOB at 30-45 degrees  VAP Prevention Bundle:   HOB elevation maintained   oral care regularly provided   vent circuit breaks minimized  Oral Care:   mouth swabbed   oral rinse provided   suction provided   teeth brushed   tongue brushed     Problem: Artificial Airway  Goal: Effective Communication  Outcome: Ongoing - Unchanged  Goal: Optimal Device Function  Outcome: Ongoing - Unchanged  Intervention: Optimize Device Care and Function  Recent Flowsheet Documentation  Taken 04/23/2024 1430 by Heron Connors, RRT  Airway/Ventilation Management:   airway patency maintained   humidification applied   pulmonary hygiene promoted  Taken 04/23/2024 0830 by Heron, Shoua Ulloa, RRT  Airway/Ventilation Management:   airway patency maintained   humidification applied   pulmonary hygiene promoted  Oral Care:   mouth swabbed   oral rinse provided   suction provided   teeth brushed   tongue brushed  Goal: Absence of Device-Related Skin or Tissue Injury  Outcome: Ongoing - Unchanged

## 2024-04-23 NOTE — Plan of Care (Signed)
 Problem: Mechanical Ventilation Invasive  Goal: Effective Communication  Outcome: Ongoing - Unchanged  Goal: Optimal Device Function  Outcome: Ongoing - Unchanged  Intervention: Optimize Device Care and Function  Recent Flowsheet Documentation  Taken 04/23/2024 0200 by Jolaine Rosaline CROME, RRT  Airway/Ventilation Management: humidification applied  Taken 04/22/2024 2215 by Jolaine Rosaline CROME, RRT  Airway/Ventilation Management: humidification applied  Goal: Mechanical Ventilation Liberation  Outcome: Ongoing - Unchanged  Goal: Absence of Device-Related Skin and Tissue Injury  Outcome: Ongoing - Unchanged  Goal: Absence of Ventilator-Induced Lung Injury  Outcome: Ongoing - Unchanged  Intervention: Prevent Ventilator-Associated Pneumonia  Recent Flowsheet Documentation  Taken 04/23/2024 0200 by Jolaine Rosaline CROME, RRT  Head of Bed [facility]) Positioning: HOB at 30-45 degrees  Taken 04/22/2024 2215 by Jolaine Rosaline CROME, RRT  Head of Bed [facility]) Positioning: HOB at 30-45 degrees

## 2024-04-23 NOTE — Progress Notes (Signed)
 MICU Daily Progress Note     Date of Service: 04/23/2024    Problem List:   Principal Problem:    Bacteremia, escherichia coli  Active Problems:    Alcoholic liver disease (HHS-HCC)    HTN (hypertension)    Depression with anxiety    Class 2 severe obesity with serious comorbidity in adult    Atrial fibrillation    (CMS-HCC)    Pleural effusion    Hypernatremia    Thrombocytopenia    Cirrhosis    (CMS-HCC)    A-fib (CMS-HCC)    Hepatic encephalopathy    (CMS-HCC)    Anaphylaxis      Interval history: [name] is a 78 y.o. female with HFpEF, HTN, Afib on AC, MASLD cirrhosis, originally hospitalized for scheduled TEE/DCCV (aborted d/t LAA clot) c/b AHHRF, encephalopathy and septic shock requiring MICU care, was s/p extubation transferred to [facility] for further management of anticoagulation and hypernatremia. Transferred back to [facility] MICU for closer monitoring for respiratory status, pressor requirement, GI bleed, and management of carotid artery injury.     On 10/24, patient had acute worsening of respiratory status leading requiring intubation. When patient was given sedation for intubation, she coded and promptly had ROSC after a few minutes of CPR. Subsequently, CVC placed in neck, used to draw blood, unclear if given pressors through it, then found  to be in arterial system based on blood gas and imaging, likely right carotid. Line was subsequently removed, and pressure was held for 15 minutes, hemostasis with no hematoma. She was transferred to [facility] MICU for management of this carotid injury, to be evaluated by vascular surgery while also managing her increased vasopressor requirement and respiratory distress.  Has been weaned off pressors since 10/27 at 1 PM. VBG's indicating that she is ready for extubation, however her mentation suggests that she is not able to protect her airway.  She was extubated on 10/31, and mentation slowly improved.  Unfortunately on the morning of 11/2 she had increased lethargy, increased secretions, and she was not protecting her airway.  She was reintubated on 11/2, and required pressor support with norepinephrine  and vasopressin  after the induction agents.  Self extubated on 11/5. Re intubated on 11/6 due to hypercarbia and increased work of breathing.  We are left with the challenging options regarding the care of [name].  Due to her multiple failed extubations, we are concerned [name] will need a tracheostomy long-term to protect her airway.  There is a meeting with palliative care and family on 04/13/2024, her family agreed for tracheostomy.  Tracheostomy placed 11/14.  On 11/16, patient started presenting with altered mental status as she became hard to arouse and stopped following commands.  Sepsis workup was initiated.  She also had a lateral gaze on exam, so neurology was consulted.  EEG showed seizure activity and MRI brain showed diffuse cortical restricted diffusion greatest within the bilateral parietal -occipital regions.  Her ammonia level is also elevated to 291.  It is unclear if seizures are related to her ammonia level.  Patient placed on Keppra  and Vimpat .    Plans for family meeting next week to discuss goals of care.  Palliative is involved.    24 hour events:   - lasix  40mg  IV x2   - fam discussing exact date timing of next GOC meeting     Neurological   Hepatic encephalopathy - Non-Convulsive Seizures   Rapid response called on 10/24 at [employer] [facility] [employer] for somnolence. Ddx  hepatic encephalopathy, toxic metabolic encephalopathy in the setting of shock (possibly sepsis), intracranial pathology. Intubated.  No acute abnormality on 10/24 head CT. Failed extubation attempts and tracheostomy placed on 11/14. Overnight on 04/18/2024, patient noted to have eye deviation along with decreased responsiveness on physical exam.   Head CT reassuring.  MRI brain with diffuse cortical restricted diffusion greatest within the bilateral parieto-occipital regions, unclear etiology. 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 Neurology consulted.  EEG with evidence of seizure activity, therefore started on Keppra  and vimpat . Ammonia significantly elevated. Clinical status changed in the setting of missed lactulose  doses (due to copious stool output) - suspect possible hyperammonemia leading to seizures. Some improvement of mental status with resuming lactulose  and adequate stool output.   - Neurology following, appreciate recommendations  - Plan to obtain MRI brain in 2 weeks (~12/4)   - Lactulose  30g q4h  - Rifaximin  550 mg twice daily   - Continue Keppra  750 mg twice daily (renally dosed)  - Continue Vimpat  100 mg twice daily  - Continue to monitor mental status  - Goals of care meeting hopefully next week with family, palliative on board   -opens eyes to stimulation, does not follow commands    Analgesia: No pain issues  RASS at goal? N/A, not on sedation  [place] Agitation Assessment Scale (RASS) : -1 (04/23/2024  4:00 AM)      Pulmonary   Acute Hypoxic Hypercarbic Respiratory Failure   Rapid response called on 10/24 for increased somnolence, found to have acute hypercarbic respiratory failure and subsequently intubated. Suspect hypercarbic respiratory failure due to encephalopathy as above. CXR on 10/24 showed worsening pleural effusions. Given patient's hypoxia, and AMS, there was concern for infectious etiology.  Infectious workup overall negative. Unable to protect her airway due to her current mentation.  Reintubated on 11/2 followed by therapeutic bronchoscopy.  Self extubated on 11/5.  Reintubate noted on 11/6 due to hypercarbia and not protecting her airway. Trach placed 11/14, complicated by anaphylactic reaction to rocuronium  which was treated with epi, benadryl  and steroids. Allergy added to chart.  First trach change completed on 04/20/2024 by ENT.  - Continue mechanical ventilation, weaning as tolerated  - Trial of high flow trach collar as tolerated  -  VBG q12  -gases stable this AM    Vent Mode: PSV-CPAP  FiO2 (%): 30 %  S RR: 15  PEEP: 5 cm H20  PR SUP: 12 cm H20    Cardiovascular   Carotid Artery Injury (resolved)  At [facility], CVC placed intended for RIJ on 10/24 following intubation, PEA, ROSC. Was used with known blood return. Unclear if it was used for pressor support, but highly likely that it was. CXR for placement check showed concern for intra aterial location, and blood gas confirmed it was arterial. Thought to be in carotid artery. CVC removed with pressure held for 15 minutes, with no hematoma formation. On arrival, no physical exam and bedside ultrasound showed no large hematoma. No active bleeding.  Vascular Surgery consulted, stated fistula has healed based on Ultrasound findings.  - CTM    PEA arrest s/p ROSC  Hypovolemic/Hemorrhagic shock (resolved)  Atrial fibrillation  known left atrial appendage clot  HFpEF  Patient with PEA arrest peri-intubation with ROSC. Shock thought to be hemorrhagic in the setting of GI bleed History of atrial fibrillation with known LAA clot and HFpEF. Vasopressors were weaned off 10/27. APTT was elevated the morning of 10/28 necessitating changing anticoagulation from therapeutic heparin  to therapeutic  Lovenox  .   -Metoprolol  25 mg twice daily    Renal   Abnormal Electrolytes - Stable Hypernatremia  At [facility], had persistently hypernatremia near 153. Had been treated with D5 gtt. Sodium throughout admission has largely been 146-148. Sodium has overall normalized and continues to be within normal limits.  - Strict I/O's  - BMP daily  - Patient received 40 mg of IV Lasix  yesterday, she continues to be net positive, will give another dose of 40 mg of IV Lasix     Infectious Disease/Autoimmune   E. Coli Bacteremia (resolved),   Initially admitted for afib, found to have E. coli bacteremia on October 10. This was treated with cefazolin . Rapid response called 10/24, patient noted to be hypothermic. WBCs jumped from 3.0 to 9.7 on 10/24. Although the most recent lab was following intubation and CPR. UA with pyuria, rare bacteria, hematuria. Peri-intubation, patient developed significant hypotension requiring initiation of NE and vasopressin . Suspect possible septic shock with unknown infectious source. Patient started on vancomycin  and cefepime  10/24. CT Abdomen pelvis concerning for aspiration pneumonia. Following infectious workup was unremarkable. Antibiotics were stopped on 10/27.  Infectious workup was repeated on 11/3 due to reintubation and status post bronchoscopy.  Completed course of ceftriaxone .  -CTM    Cultures:  Blood Culture, Routine (no units)   Date Value   04/17/2024 No Growth at 5 days   04/17/2024 No Growth at 5 days     Urine Culture, Comprehensive (no units)   Date Value   04/17/2024 NO GROWTH   04/09/2024 NO GROWTH     Lower Respiratory Culture (no units)   Date Value   04/10/2024 2+ Methicillin-Susceptible Staphylococcus aureus (A)   04/10/2024 1+ Oropharyngeal Flora Isolated     WBC (10*9/L)   Date Value   04/23/2024 4.4     WBC, UA (/HPF)   Date Value   04/17/2024 1       FEN/GI   Sigmoid Mass c/f Malignancy and Hematochezia (resolved)  Hemorrhagic Shock (resolved)  CT abdomen pelvis done 10/24 with evidence of cirrhosis and masslike thickening of the lower sigmoid colon concerning for malignancy.  CTA abdomen pelvis performed 1024 with active extravasation into the gastric fundus possibly secondary to traumatic enteric tube placement.  GI scoped the patient on 10/25 and clipped an area of active bleeding.  They removed a total of 1.5 L of blood.  Hemoglobin 10/20 6 AM downtrended to 6.0, so 2 units of blood transfused.  GI stated this was consistent with 10/25 scope and likely did not represent increased bleeding. Hb steadily improving with Hb of 9.2 on 10/28.   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 Given shock of unclear etiology. CTAP demonstrating cirrhosis. Likely that cirrhosis was contributing to hypotension.  Worsening altered mental status with seizure-like activity in the setting of hyperammonemia after missing 2-3 lactulose  doses due to copious stool output.  Evidence of seizure activity on EEG.  Mental status with slow improvement once lactulose  was resumed.  -Continue lactulose  30 g every 4 hours  - Goal stool output 2-4 L daily  - Daily MELD labs and CMP    Vitamin Deficiencies  Low vitamin D , copper , and zinc .  -Appreciate dietitian recommendations  - Continue tube feeds (currently at goal)  - Zinc  repletion  -  Multivitamin 1 tablet daily  - Folic acid  1 mg daily  - Copper  2 mg IV  - Vitamin B12 1000 mcg daily     MELD 3.0: 15 at 04/05/2024  4:31 AM  MELD-Na: 13 at 04/05/2024  4:31 AM  Calculated from:  Serum Creatinine: 0.43 mg/dL (Using min of 1 mg/dL) at 88/09/7972  5:68 AM  Serum Sodium: 143 mmol/L (Using max of 137 mmol/L) at 04/05/2024  4:31 AM  Total Bilirubin: 1.3 mg/dL at 88/09/7972  5:68 AM  Serum Albumin : 2.4 g/dL at 88/09/7972  5:68 AM  INR(ratio): 1.61 at 04/03/2024 10:26 AM  Age at listing (hypothetical): 78 years  Sex: Female at 04/05/2024  4:31 AM    Provider Malnutrition Assessment:  Body mass index is 41 kg/m??. BMI Interpretation: >/= 30 and < 40, consistent with obesity, clinically significant requiring additional resources and complicating multiple aspects of patient care.  GLIM criteria:   Pt does not meet criteria  -I have screened this patient for malnutrition and they did NOT meet criteria for malnutrition based on GLIM criteria.  -TF and FWF; Dietician consulted, appreciate assistance      Heme/Coag   Acute blood loss anemia  Hemoglobin down trended to 6.0 on the morning of 10/26 thought to be secondary to acute GI bleed that was scoped and clipped 10/25.  H/H stable.  -CBC daily  - Maintain active T&S    Endocrine   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)   - home anastrozole     Integumentary   NAI   #  - WOCN consulted for high risk skin assessment No. Reason: Not indicated.    Prophylaxis/LDA/Restraints/Consults   ICU Checklist completed: yes (see ICU rounding navigator in Epic)    Patient Lines/Drains/Airways Status       Active Active Lines, Drains, & Airways       Name Placement date Placement time Site Days    Tracheostomy Shiley 6 04/20/24  1430  6  2    NG/OG Tube Feedings 10 Fr. Right nostril 04/01/24  1138  Right nostril  21    External Urinary Device 04/13/24 With Suction 04/13/24  1700  -- 9    PICC Double Lumen 04/06/24 Left Basilic 04/06/24  1558  Basilic  16    Peripheral IV 88/79/74 Right Wrist 04/21/24  0830  Wrist  1                  Patient Lines/Drains/Airways Status       Active Wounds       Name Placement date Placement time Site Days    Wound 03/13/24 Irritant Contact Dermatitis Incontinence Sacrum Mid gluteal cleft MASD? 03/13/24  --  Sacrum  41                  Goals of Care     Code Status:   Orders Placed This Encounter   Procedures    Full Code     Standing Status:   Standing     Number of Occurrences:   1        Designated Healthcare Decision Maker:  Ms. Gattuso designated healthcare decision maker(s) is/are   HCDM (patient stated preference): Starkman,[name] - Spouse - [phone]    HCDM (patient stated preference): Slomski,[name] - Daughter - [phone]. See HCDM section of Epic sidebar/storyboard or ACP tab in patient chart for details regarding active HCDMs and patient capacity for decision-making.  Subjective     Intubated.  Withdraws to pain. Opens eyes to stimulation.  Unable to reliably follow commands.    Objective     Vitals - past 24 hours  Temp:  [36.7 ??C (98 ??F)-37.3 ??C (99.1 ??F)] 37.2 ??C (98.9 ??F)  Pulse:  [89-110] 93  SpO2 Pulse:  [90-109] 93  Resp:  [19-34] 29  BP: (106-144)/(41-82) 130/52  FiO2 (%):  [30 %] 30 %  SpO2:  [98 %-100 %] 100 % Intake/Output  I/O last 3 completed shifts:  In: 5628.8 [P.O.:180; I.V.:313.8; NG/GT:5035; IV Piggyback:100]  Out: 4450 [Urine:900; Stool:3550]     Physical Exam:    General: Intubated.   HEENT: normocephalic, atraumatic.  Trach in place.   CV: pulses palpable, regular borderline tachycardia   Pulm: Rhonchi w transmitted upper airway sounds throughout  GI: soft, NTND, + BS  MSK: Bilateral LE pitting edema  Skin: Moist skin breakdown around trach.  Neuro: Withdrawals to painful stimuli.  Not following commands.  Reflexes intact    Continuous Infusions:   Infusions Meds[1]    Scheduled Medications:   Scheduled Medications[2]    PRN medications:  PRN Medications[3]    Data/Imaging Review: Reviewed in Epic and personally interpreted on 04/23/2024. See EMR for detailed results.    Waddell CHRISTELLA Mead, DO  PGY-1, Emergency Medicine           [1]    heparin  11 Units/kg/hr (04/22/24 1334)    NORepinephrine  bitartrate-NS Stopped (04/19/24 0050)   [2]    cupric chloride  (copper ) 2 mg in sodium chloride  (NS) 0.9 % 250 mL IVPB  2 mg Intravenous Daily    cyanocobalamin  (vitamin B-12)  1,000 mcg Enteral tube: gastric Daily    ergocalciferol   100 mcg Enteral tube: gastric Daily    esomeprazole   40 mg Enteral tube: gastric Daily    famotidine   20 mg Enteral tube: gastric Daily    flu vac 2025 [id number](PF)  0.5 mL Intramuscular During hospitalization    folic acid   1 mg Enteral tube: gastric Daily    lacosamide   100 mg Intravenous BID    lactulose   30 g Enteral tube: gastric Q4H    levETIRAcetam   750 mg Intravenous Q12H SCH    magnesium  sulfate  2 g Intravenous Once    melatonin  3 mg Enteral tube: gastric QPM    metoPROLOL  tartrate  25 mg Enteral tube: gastric BID    multivitamins (ADULT)  1 tablet Enteral tube: gastric Daily    potassium chloride  in water   20 mEq Intravenous Once    Followed by    potassium chloride  in water   20 mEq Intravenous Once    rifAXIMin   550 mg Enteral tube: gastric BID    sodium chloride   10 mL Intravenous Q8H    sodium chloride   10 mL Intravenous Q8H    thiamine  mononitrate (vit B1)  100 mg Enteral tube: gastric Daily    zinc  acetate  50 mg elem zinc  Enteral tube: gastric Daily   [3] acetaminophen , heparin  (porcine)

## 2024-04-23 NOTE — Plan of Care (Signed)
 Problem: Skin Injury Risk Increased  Goal: Skin Health and Integrity  Outcome: Shift Focus  Intervention: Optimize Skin Protection  Recent Flowsheet Documentation  Taken 04/23/2024 0600 by Harden Buzzard, Wright RAMAN, RN  Pressure Reduction Techniques: frequent weight shift encouraged  Head of Bed [facility]) Positioning: HOB at 30-45 degrees  Taken 04/23/2024 0400 by Harden Buzzard, Wright RAMAN, RN  Pressure Reduction Techniques: frequent weight shift encouraged  Head of Bed [facility]) Positioning: HOB at 30-45 degrees  Taken 04/23/2024 0200 by Harden Buzzard, Wright S, RN  Pressure Reduction Techniques: frequent weight shift encouraged  Taken 04/23/2024 0000 by Harden Buzzard Wright RAMAN, RN  Pressure Reduction Techniques: frequent weight shift encouraged  Head of Bed [facility]) Positioning: HOB at 30-45 degrees  Taken 04/22/2024 2200 by Harden Buzzard, Wright RAMAN, RN  Pressure Reduction Techniques: frequent weight shift encouraged  Head of Bed [facility]) Positioning: HOB at 30-45 degrees  Taken 04/22/2024 2000 by Harden Buzzard, Ellsworth S, RN  Pressure Reduction Techniques: frequent weight shift encouraged  Head of Bed (HOB) Positioning: HOB at 30-45 degrees     Problem: Adult Inpatient Plan of Care  Goal: Absence of Hospital-Acquired Illness or Injury  Outcome: Shift Focus  Intervention: Identify and Manage Fall Risk  Recent Flowsheet Documentation  Taken 04/23/2024 0600 by Harden Buzzard Wright RAMAN, RN  Safety Interventions: aspiration precautions  Taken 04/23/2024 0400 by Harden Buzzard Wright RAMAN, RN  Safety Interventions: aspiration precautions  Taken 04/23/2024 0200 by Harden Buzzard Wright RAMAN, RN  Safety Interventions: aspiration precautions  Taken 04/23/2024 0000 by Harden Buzzard Wright RAMAN, RN  Safety Interventions: aspiration precautions  Taken 04/22/2024 2200 by Harden Buzzard Wright RAMAN, RN  Safety Interventions: aspiration precautions  Taken 04/22/2024 2000 by Harden Buzzard Wright RAMAN, RN  Safety Interventions: aspiration precautions  Intervention: Prevent Skin Injury  Recent Flowsheet Documentation  Taken 04/23/2024 0600 by Harden Buzzard Wright RAMAN, RN  Positioning for Skin: Left  Taken 04/23/2024 0400 by Harden Buzzard Wright RAMAN, RN  Positioning for Skin: Right  Taken 04/23/2024 0200 by Harden Buzzard Wright RAMAN, RN  Positioning for Skin: Left  Taken 04/23/2024 0000 by Harden Buzzard Wright RAMAN, RN  Positioning for Skin: Left  Taken 04/22/2024 2200 by Harden Buzzard Wright RAMAN, RN  Positioning for Skin: Right  Taken 04/22/2024 2000 by Harden Buzzard Wright RAMAN, RN  Positioning for Skin: Left   Shift Summary  Skin remained weeping and bruised, with frequent repositioning and pressure reduction interventions performed to support skin integrity.   Aspiration precautions and full side rail use were maintained, and the environment was kept safe throughout the shift.   Comfort was supported with CPOT scores consistently at 0 and no observed pain behaviors.   Perineal care and absorbent brief changes were performed multiple times to maintain hygiene and reduce risk of skin breakdown.   Overall, interventions focused on maintaining skin integrity, safety, and comfort during the shift.    Skin Health and Integrity: Weeping and bruising of the skin persisted throughout the shift, with frequent repositioning and weight shifts performed to reduce pressure; absorbent briefs were changed and perineal care was provided multiple times to support skin hygiene.    Absence of Hospital-Acquired Illness or Injury: Aspiration precautions and full side rail use were maintained at all times, and the environment remained safe during the shift; no new hospital-acquired injuries were documented.    Optimal Comfort and Wellbeing: CPOT scores remained at 0 with relaxed facial expression and muscle tension, and no abnormal body movements were  observed, suggesting comfort was maintained throughout the shift.

## 2024-04-23 NOTE — Plan of Care (Signed)
 Pt on vent, PS 12/5 30%. Not following commands. output via FMS. ~624mL Uop. Diuresed with lasix . Heparin  drip continued @11 . Family at bedside and updated. See flowsheets/MAR for more info.    Problem: Skin Injury Risk Increased  Goal: Skin Health and Integrity  Outcome: Shift Focus  Intervention: Optimize Skin Protection  Recent Flowsheet Documentation  Taken 04/23/2024 1600 by Debby Jessie BRAVO, RN  Head of Bed [facility]) Positioning: HOB at 30-45 degrees  Taken 04/23/2024 1200 by Debby Jessie BRAVO, RN  Pressure Reduction Techniques:   frequent weight shift encouraged   heels elevated off bed   weight shift assistance provided  Head of Bed (HOB) Positioning: HOB at 30-45 degrees  Pressure Reduction Devices: pressure-redistributing mattress utilized  Skin Protection:   adhesive use limited   cleansing with dimethicone incontinence wipes     Problem: Breathing Pattern Ineffective  Goal: Effective Breathing Pattern  Outcome: Shift Focus  Intervention: Promote Improved Breathing Pattern  Recent Flowsheet Documentation  Taken 04/23/2024 1600 by Debby Jessie BRAVO, RN  Head of Bed [facility]) Positioning: HOB at 30-45 degrees  Taken 04/23/2024 1200 by Debby Jessie BRAVO, RN  Head of Bed [facility]) Positioning: HOB at 30-45 degrees     Problem: Mechanical Ventilation Invasive  Goal: Optimal Device Function  Outcome: Shift Focus  Intervention: Optimize Device Care and Function  Recent Flowsheet Documentation  Taken 04/23/2024 1517 by Geral Tuch E, RN  Oral Care:   mouth swabbed   suction provided     Problem: Adult Inpatient Plan of Care  Goal: Absence of Hospital-Acquired Illness or Injury  Intervention: Identify and Manage Fall Risk  Recent Flowsheet Documentation  Taken 04/23/2024 1200 by Makenzye Troutman E, RN  Safety Interventions:   aspiration precautions   bed alarm   bleeding precautions   commode/urinal/bedpan at bedside   environmental modification   fall reduction program maintained   family at bedside   infection management   lighting adjusted for tasks/safety   low bed   nonskid shoes/slippers when out of bed   room near unit station  Intervention: Prevent Skin Injury  Recent Flowsheet Documentation  Taken 04/23/2024 1800 by Anelia Carriveau E, RN  Positioning for Skin: Right  Taken 04/23/2024 1600 by Debby Jessie BRAVO, RN  Positioning for Skin: Left  Taken 04/23/2024 1400 by Debby Jessie BRAVO, RN  Positioning for Skin: Right  Taken 04/23/2024 1200 by Debby Jessie BRAVO, RN  Positioning for Skin: Left  Device Skin Pressure Protection:   absorbent pad utilized/changed   adhesive use limited  Skin Protection:   adhesive use limited   cleansing with dimethicone incontinence wipes  Intervention: Prevent Infection  Recent Flowsheet Documentation  Taken 04/23/2024 1200 by Shyann Hefner E, RN  Infection Prevention:   environmental surveillance performed   equipment surfaces disinfected   hand hygiene promoted   personal protective equipment utilized   rest/sleep promoted     Problem: Fall Injury Risk  Goal: Absence of Fall and Fall-Related Injury  Intervention: Promote Injury-Free Environment  Recent Flowsheet Documentation  Taken 04/23/2024 1200 by Jemel Ono E, RN  Safety Interventions:   aspiration precautions   bed alarm   bleeding precautions   commode/urinal/bedpan at bedside   environmental modification   fall reduction program maintained   family at bedside   infection management   lighting adjusted for tasks/safety   low bed   nonskid shoes/slippers when out of bed   room near unit station     Problem: Gas Exchange Impaired  Goal: Optimal Gas Exchange  Intervention: Optimize Oxygenation and Ventilation  Recent Flowsheet Documentation  Taken 04/23/2024 1600 by Ziyan Hillmer E, RN  Head of Bed [facility]) Positioning: HOB at 30-45 degrees  Taken 04/23/2024 1200 by Debby Jessie BRAVO, RN  Head of Bed [facility]) Positioning: HOB at 30-45 degrees     Problem: Wound  Goal: Absence of Infection Signs and Symptoms  Intervention: Prevent or Manage Infection  Recent Flowsheet Documentation  Taken 04/23/2024 1200 by Debby Jessie BRAVO, RN  Infection Management: aseptic technique maintained  Goal: Skin Health and Integrity  Intervention: Optimize Skin Protection  Recent Flowsheet Documentation  Taken 04/23/2024 1600 by Debby Jessie BRAVO, RN  Head of Bed [facility]) Positioning: HOB at 30-45 degrees  Taken 04/23/2024 1200 by Debby Jessie BRAVO, RN  Pressure Reduction Techniques:   frequent weight shift encouraged   heels elevated off bed   weight shift assistance provided  Head of Bed (HOB) Positioning: HOB at 30-45 degrees  Pressure Reduction Devices: pressure-redistributing mattress utilized  Skin Protection:   adhesive use limited   cleansing with dimethicone incontinence wipes     Problem: Mechanical Ventilation Invasive  Goal: Optimal Device Function  Intervention: Optimize Device Care and Function  Recent Flowsheet Documentation  Taken 04/23/2024 1517 by Sherline Eberwein E, RN  Oral Care:   mouth swabbed   suction provided  Goal: Absence of Device-Related Skin and Tissue Injury  Intervention: Maintain Skin and Tissue Health  Recent Flowsheet Documentation  Taken 04/23/2024 1200 by Debby Jessie BRAVO, RN  Device Skin Pressure Protection:   absorbent pad utilized/changed   adhesive use limited  Goal: Absence of Ventilator-Induced Lung Injury  Intervention: Prevent Ventilator-Associated Pneumonia  Recent Flowsheet Documentation  Taken 04/23/2024 1600 by Debby Jessie BRAVO, RN  Head of Bed [facility]) Positioning: HOB at 30-45 degrees  Taken 04/23/2024 1517 by Lynne Righi E, RN  Oral Care:   mouth swabbed   suction provided  Taken 04/23/2024 1200 by Debby Jessie BRAVO, RN  Head of Bed [facility]) Positioning: HOB at 30-45 degrees     Problem: Mechanical Ventilation Invasive  Goal: Optimal Device Function  Intervention: Optimize Device Care and Function  Recent Flowsheet Documentation  Taken 04/23/2024 1517 by Estuardo Frisbee E, RN  Oral Care:   mouth swabbed suction provided  Goal: Absence of Device-Related Skin and Tissue Injury  Intervention: Maintain Skin and Tissue Health  Recent Flowsheet Documentation  Taken 04/23/2024 1200 by Debby Jessie BRAVO, RN  Device Skin Pressure Protection:   absorbent pad utilized/changed   adhesive use limited  Goal: Absence of Ventilator-Induced Lung Injury  Intervention: Prevent Ventilator-Associated Pneumonia  Recent Flowsheet Documentation  Taken 04/23/2024 1600 by Debby Jessie BRAVO, RN  Head of Bed [facility]) Positioning: HOB at 30-45 degrees  Taken 04/23/2024 1517 by Willine Schwalbe E, RN  Oral Care:   mouth swabbed   suction provided  Taken 04/23/2024 1200 by Debby Jessie BRAVO, RN  Head of Bed [facility]) Positioning: HOB at 30-45 degrees     Problem: Mechanical Ventilation Invasive  Goal: Absence of Device-Related Skin and Tissue Injury  Intervention: Maintain Skin and Tissue Health  Recent Flowsheet Documentation  Taken 04/23/2024 1200 by Debby Jessie BRAVO, RN  Device Skin Pressure Protection:   absorbent pad utilized/changed   adhesive use limited  Goal: Absence of Ventilator-Induced Lung Injury  Intervention: Prevent Ventilator-Associated Pneumonia  Recent Flowsheet Documentation  Taken 04/23/2024 1600 by Debby Jessie BRAVO, RN  Head of Bed [facility]) Positioning: HOB at 30-45 degrees  Taken 04/23/2024  1517 by Kahlil Cowans E, RN  Oral Care:   mouth swabbed   suction provided  Taken 04/23/2024 1200 by Debby Jessie BRAVO, RN  Head of Bed [facility]) Positioning: HOB at 30-45 degrees     Problem: Mechanical Ventilation Invasive  Goal: Optimal Device Function  Intervention: Optimize Device Care and Function  Recent Flowsheet Documentation  Taken 04/23/2024 1517 by Sharay Bellissimo E, RN  Oral Care:   mouth swabbed   suction provided  Goal: Absence of Device-Related Skin and Tissue Injury  Intervention: Maintain Skin and Tissue Health  Recent Flowsheet Documentation  Taken 04/23/2024 1200 by Debby Jessie BRAVO, RN  Device Skin Pressure Protection: absorbent pad utilized/changed   adhesive use limited  Goal: Absence of Ventilator-Induced Lung Injury  Intervention: Prevent Ventilator-Associated Pneumonia  Recent Flowsheet Documentation  Taken 04/23/2024 1600 by Debby Jessie BRAVO, RN  Head of Bed [facility]) Positioning: HOB at 30-45 degrees  Taken 04/23/2024 1517 by Sindee Stucker E, RN  Oral Care:   mouth swabbed   suction provided  Taken 04/23/2024 1200 by Debby Jessie BRAVO, RN  Head of Bed [facility]) Positioning: HOB at 30-45 degrees     Problem: Infection  Goal: Absence of Infection Signs and Symptoms  Intervention: Prevent or Manage Infection  Recent Flowsheet Documentation  Taken 04/23/2024 1200 by Debby Jessie BRAVO, RN  Infection Management: aseptic technique maintained     Problem: Mechanical Ventilation Invasive  Goal: Optimal Device Function  Intervention: Optimize Device Care and Function  Recent Flowsheet Documentation  Taken 04/23/2024 1517 by Eddie Payette E, RN  Oral Care:   mouth swabbed   suction provided  Goal: Absence of Device-Related Skin and Tissue Injury  Intervention: Maintain Skin and Tissue Health  Recent Flowsheet Documentation  Taken 04/23/2024 1200 by Debby Jessie BRAVO, RN  Device Skin Pressure Protection:   absorbent pad utilized/changed   adhesive use limited  Goal: Absence of Ventilator-Induced Lung Injury  Intervention: Prevent Ventilator-Associated Pneumonia  Recent Flowsheet Documentation  Taken 04/23/2024 1600 by Debby Jessie BRAVO, RN  Head of Bed [facility]) Positioning: HOB at 30-45 degrees  Taken 04/23/2024 1517 by Mykeal Carrick E, RN  Oral Care:   mouth swabbed   suction provided  Taken 04/23/2024 1200 by Debby Jessie BRAVO, RN  Head of Bed [facility]) Positioning: HOB at 30-45 degrees     Problem: Mechanical Ventilation Invasive  Goal: Optimal Device Function  Intervention: Optimize Device Care and Function  Recent Flowsheet Documentation  Taken 04/23/2024 1517 by Mishell Donalson E, RN  Oral Care:   mouth swabbed   suction provided  Goal: Absence of Device-Related Skin and Tissue Injury  Intervention: Maintain Skin and Tissue Health  Recent Flowsheet Documentation  Taken 04/23/2024 1200 by Kazumi Lachney E, RN  Device Skin Pressure Protection:   absorbent pad utilized/changed   adhesive use limited  Goal: Absence of Ventilator-Induced Lung Injury  Intervention: Prevent Ventilator-Associated Pneumonia  Recent Flowsheet Documentation  Taken 04/23/2024 1600 by Debby Jessie BRAVO, RN  Head of Bed [facility]) Positioning: HOB at 30-45 degrees  Taken 04/23/2024 1517 by Gervis Gaba E, RN  Oral Care:   mouth swabbed   suction provided  Taken 04/23/2024 1200 by Debby Jessie BRAVO, RN  Head of Bed [facility]) Positioning: HOB at 30-45 degrees     Problem: Artificial Airway  Goal: Optimal Device Function  Intervention: Optimize Device Care and Function  Recent Flowsheet Documentation  Taken 04/23/2024 1517 by Vashaun Osmon E, RN  Oral Care:   mouth swabbed  suction provided  Taken 04/23/2024 1200 by Debby Jessie BRAVO, RN  Aspiration Precautions:   upright posture maintained   respiratory status monitored   tube feeding placement verified  Goal: Absence of Device-Related Skin or Tissue Injury  Intervention: Maintain Skin and Tissue Health  Recent Flowsheet Documentation  Taken 04/23/2024 1200 by Dorethy Tomey E, RN  Device Skin Pressure Protection:   absorbent pad utilized/changed   adhesive use limited

## 2024-04-24 LAB — BASIC METABOLIC PANEL
ANION GAP: 12 mmol/L (ref 5–14)
ANION GAP: 11 mmol/L (ref 5–14)
BLOOD UREA NITROGEN: 18 mg/dL (ref 9–23)
BLOOD UREA NITROGEN: 16 mg/dL (ref 9–23)
BUN / CREAT RATIO: 43
BUN / CREAT RATIO: 40
CALCIUM: 8.3 mg/dL — ABNORMAL LOW (ref 8.7–10.4)
CALCIUM: 8.3 mg/dL — ABNORMAL LOW (ref 8.7–10.4)
CHLORIDE: 109 mmol/L — ABNORMAL HIGH (ref 98–107)
CHLORIDE: 108 mmol/L — ABNORMAL HIGH (ref 98–107)
CO2: 26 mmol/L (ref 20.0–31.0)
CO2: 25 mmol/L (ref 20.0–31.0)
CREATININE: 0.42 mg/dL — ABNORMAL LOW (ref 0.55–1.02)
CREATININE: 0.4 mg/dL — ABNORMAL LOW (ref 0.55–1.02)
EGFR CKD-EPI (2021) FEMALE: >90 mL/min/1.73m2 (ref >=60)
EGFR CKD-EPI (2021) FEMALE: >90 mL/min/1.73m2 (ref >=60)
GLUCOSE RANDOM: 131 mg/dL (ref 70–179)
GLUCOSE RANDOM: 141 mg/dL (ref 70–179)
POTASSIUM: 3.6 mmol/L (ref 3.4–4.8)
POTASSIUM: 3.8 mmol/L (ref 3.4–4.8)
SODIUM: 147 mmol/L — ABNORMAL HIGH (ref 135–145)
SODIUM: 144 mmol/L (ref 135–145)

## 2024-04-24 LAB — BLOOD GAS, VENOUS
BASE EXCESS VENOUS: 1.1 (ref -2.0–2.0)
BASE EXCESS VENOUS: 0.7 (ref -2.0–2.0)
CARBOXYHEMOGLOBIN, VENOUS: 1.6 % — ABNORMAL HIGH (ref <1.2)
CARBOXYHEMOGLOBIN, VENOUS: 1.7 % — ABNORMAL HIGH (ref <1.2)
HCO3 VENOUS: 25 mmol/L (ref 22–27)
HCO3 VENOUS: 25 mmol/L (ref 22–27)
METHEMOGLOBIN, VENOUS: <1 % (ref <1.5)
METHEMOGLOBIN, VENOUS: <1 % (ref <1.5)
O2 SATURATION VENOUS: 64.9 % (ref 40.0–85.0)
O2 SATURATION VENOUS: 60.8 % (ref 40.0–85.0)
OXYHEMOGLOBIN, VENOUS: 63.6 % (ref 40.0–85.0)
OXYHEMOGLOBIN, VENOUS: 59.5 % (ref 40.0–85.0)
PCO2 VENOUS: 41 mmHg (ref 40–60)
PCO2 VENOUS: 42 mmHg (ref 40–60)
PH VENOUS: 7.41 (ref 7.32–7.43)
PH VENOUS: 7.4 (ref 7.32–7.43)
PO2 VENOUS: 40 mmHg (ref 30–55)
PO2 VENOUS: 39 mmHg (ref 30–55)

## 2024-04-24 LAB — HEPATIC FUNCTION PANEL
ALBUMIN: 2.5 g/dL — ABNORMAL LOW (ref 3.4–5.0)
ALKALINE PHOSPHATASE: 179 U/L — ABNORMAL HIGH (ref 46–116)
ALT (SGPT): 15 U/L (ref 10–49)
AST (SGOT): 35 U/L — ABNORMAL HIGH (ref <=34)
BILIRUBIN DIRECT: 0.4 mg/dL — ABNORMAL HIGH (ref 0.00–0.30)
BILIRUBIN TOTAL: 0.7 mg/dL (ref 0.3–1.2)
PROTEIN TOTAL: 5.5 g/dL — ABNORMAL LOW (ref 5.7–8.2)

## 2024-04-24 LAB — PHOSPHORUS: PHOSPHORUS: 2.7 mg/dL (ref 2.4–5.1)

## 2024-04-24 LAB — MAGNESIUM
MAGNESIUM: 2 mg/dL (ref 1.6–2.6)
MAGNESIUM: 1.9 mg/dL (ref 1.6–2.6)

## 2024-04-24 LAB — APTT
APTT: 97.4 s — ABNORMAL HIGH (ref 24.8–38.4)
APTT: 74.8 s — ABNORMAL HIGH (ref 24.8–38.4)
HEPARIN CORRELATION: 0.6
HEPARIN CORRELATION: 0.4

## 2024-04-24 LAB — CYSTATIN C
CYSTATIN C: 1.71 mg/L — ABNORMAL HIGH (ref 0.64–1.23)
EGFR CKD-EPI (2012) CYSTATIN C FEMALE: 33 mL/min/1.73m2 — ABNORMAL LOW (ref >=60)

## 2024-04-24 MED ADMIN — sodium chloride (NS) 0.9 % flush 10 mL: 10 mL | INTRAVENOUS | @ 17:00:00

## 2024-04-24 MED ADMIN — sodium chloride (NS) 0.9 % flush 10 mL: 10 mL | INTRAVENOUS | @ 08:00:00

## 2024-04-24 MED ADMIN — sodium chloride (NS) 0.9 % flush 10 mL: 10 mL | INTRAVENOUS

## 2024-04-24 MED ADMIN — sodium chloride (NS) 0.9 % flush 10 mL: 10 mL | INTRAVENOUS | @ 01:00:00

## 2024-04-24 MED ADMIN — rifAXIMin (XIFAXAN) oral suspension: 550 mg | GASTROENTERAL | @ 03:00:00 | Stop: 2025-04-11

## 2024-04-24 MED ADMIN — rifAXIMin (XIFAXAN) oral suspension: 550 mg | GASTROENTERAL | @ 14:00:00 | Stop: 2025-04-11

## 2024-04-24 MED ADMIN — folic acid (FOLVITE) tablet 1 mg: 1 mg | GASTROENTERAL | @ 14:00:00

## 2024-04-24 MED ADMIN — metoPROLOL tartrate (Lopressor) tablet 25 mg: 25 mg | GASTROENTERAL | @ 03:00:00

## 2024-04-24 MED ADMIN — metoPROLOL tartrate (Lopressor) tablet 25 mg: 25 mg | GASTROENTERAL | @ 14:00:00

## 2024-04-24 MED ADMIN — lactulose oral solution: 30 g | GASTROENTERAL | @ 11:00:00

## 2024-04-24 MED ADMIN — lactulose oral solution: 30 g | GASTROENTERAL | @ 23:00:00

## 2024-04-24 MED ADMIN — lactulose oral solution: 30 g | GASTROENTERAL | @ 19:00:00

## 2024-04-24 MED ADMIN — lactulose oral solution: 30 g | GASTROENTERAL | @ 03:00:00

## 2024-04-24 MED ADMIN — lactulose oral solution: 30 g | GASTROENTERAL | @ 14:00:00

## 2024-04-24 MED ADMIN — lactulose oral solution: 30 g | GASTROENTERAL | @ 07:00:00

## 2024-04-24 MED ADMIN — thiamine mononitrate (vit B1) tablet 100 mg: 100 mg | GASTROENTERAL | @ 14:00:00

## 2024-04-24 MED ADMIN — ergocalciferol (DRISDOL) oral drops 200 mcg/mL (8,000 unit/mL): 100 ug | GASTROENTERAL | @ 14:00:00 | Stop: 2024-05-21

## 2024-04-24 MED ADMIN — cyanocobalamin (vitamin B-12) tablet 1,000 mcg: 1000 ug | GASTROENTERAL | @ 14:00:00

## 2024-04-24 MED ADMIN — levETIRAcetam (KEPPRA) tablet 750 mg: 750 mg | GASTROENTERAL | @ 03:00:00

## 2024-04-24 MED ADMIN — levETIRAcetam (KEPPRA) tablet 750 mg: 750 mg | GASTROENTERAL | @ 14:00:00

## 2024-04-24 MED ADMIN — famotidine (PEPCID) tablet 20 mg: 20 mg | GASTROENTERAL | @ 14:00:00

## 2024-04-24 MED ADMIN — melatonin tablet 3 mg: 3 mg | GASTROENTERAL | @ 23:00:00

## 2024-04-24 MED ADMIN — zinc acetate oral solution: 50 mg | GASTROENTERAL | @ 23:00:00 | Stop: 2024-05-06

## 2024-04-24 MED ADMIN — esomeprazole (NEXIUM) granules 40 mg: 40 mg | GASTROENTERAL | @ 14:00:00

## 2024-04-24 MED ADMIN — multivitamins, therapeutic with minerals tablet 1 tablet: 1 | GASTROENTERAL | @ 14:00:00

## 2024-04-24 MED ADMIN — lacosamide (VIMPAT) tablet 100 mg: 100 mg | GASTROENTERAL | @ 03:00:00

## 2024-04-24 MED ADMIN — lacosamide (VIMPAT) tablet 100 mg: 100 mg | GASTROENTERAL | @ 14:00:00

## 2024-04-24 MED ADMIN — cupric chloride (copper) 2 mg in sodium chloride (NS) 0.9 % 250 mL IVPB: 2 mg | INTRAVENOUS | @ 14:00:00 | Stop: 2024-04-27

## 2024-04-24 MED ADMIN — potassium chloride 20 mEq in 100 mL IVPB Premix: 20 meq | INTRAVENOUS | @ 12:00:00 | Stop: 2024-04-24

## 2024-04-24 MED ADMIN — heparin 25,000 Units/250 mL (100 units/mL) in 0.45% saline infusion (premade): 0-24 [IU]/kg/h | INTRAVENOUS | @ 15:00:00

## 2024-04-24 MED ADMIN — furosemide (LASIX) injection 40 mg: 40 mg | INTRAVENOUS | @ 15:00:00 | Stop: 2024-04-24

## 2024-04-24 MED ADMIN — potassium chloride 20 mEq in 100 mL IVPB Premix: 20 meq | INTRAVENOUS | @ 11:00:00 | Stop: 2024-04-24

## 2024-04-24 NOTE — Consults (Signed)
 Follow-Up Consult Note        Requesting Attending Physician:  Morene ONEIDA Dec, MD  Service Requesting Consult: Medical ICU (MDI)     Assessment and Plan     [name] is a 78 y.o. female pmhx HFpEF, afib, and MASLD cirrhosis admitted with respiratory failure, hepatic encephalopathy, and septic shock course with course c/b hypernatremia on whom I have been asked by Morene ONEIDA Dec, MD to consult for c/f seizure.    # Roving eye movements:  # Metabolic encephalopathy   Neurology team consulted for evaluation of possible nonrhythmic roving eye movements lasting several hours in duration, in context of progressively declining mental status over 3-4 days (previously intermittently following commands). Roving eye movements are a sign of diffuse cortical dysfunction with preserved brainstem function, most often seen in metabolic encephalopathy. Similarly presenting symptoms include ping-pong gaze (often associated with metabolic encephalopathy, diffuse cortical dysfunction, or toxic ingestion), and periodic alternating gaze deviation (seen in metabolic encephalopathies. This is consistent with patients long complicated course- initially admitted to OSH with DCCV/TEE which was aborted d/t LAA thrombus, multiple episodes of hypercarbic respiratory failure eventually requiring tracheostomy, PEA arrest 10/24 w/ ROSC after min, shock likely 2/2 GIB, E.coli bacteremia, and multiple episodes of hyperammonemia (31 on 9/30, 251 10/10--> 82 10/13-> 160 10/24-> 291 11/16). While patient does have seizures as noted below, these are likely separate from eye movements.    # Diffuse Cortical Restriction  MRI w/ diffuse cortical restriction of the bilateral parieto-occipital regions concerning for prolonged seizure vs PRES vs hypoxic ischemic encephalopathy vs hyperammonemia- All new since 11/2 MRI. Clinical course not consistent with CJD. Imaging discussed at neuroradiology conference - wide diffuse pattern is most suggestive of severe toxic-metabolic etiology (hyperammonemia and/or PRES (severe ammonia swings, renal injury). Pattern does not localize to vascular territory and patient is afebrile with no leukocytosis- decreased concern for meningitis/encephalitis.  Patient did have repeated episodes of AMS since MRI 11/2 and seizures were subclinical- possible she was having seizures previously., however is not favored to be primary etiology at this time.     # Non-convulsive Seizures  cvEEG w/ LPD+F c/f IIC with possible ictal features.  Transient improvement with LEV load (transitioned to burst attenuation pattern with persistent bilateral posterior BIRDs). LCS load was added again with transient improvement to bilateral posterior LPDs 1 hz,-Dc'd. Exam next day improved- opening eyes to voice, some regarding, semi-localizing to noxious stimulus in the BUE, and weak withdrawal to noxious in the BLE- remained stable since despite no sedation and some improvement of toxic-metabolic abnormalities    # Diffuse hyperreflexia:   Hyperreflexia in BUE with + hoffman's and pectoralis reflexes, jaw jerk absent. Bilateral clonus (4+ beats) also noted. No recent serotonergic medications nor fever. 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, moderate canal stenosis at C4-C5, moderate/severe canal stenosis at C6-C7, severe neuroforaminal stenosis at bilateral C5-C6 and left C6-C7- abnormal cord signal likely contributing to exam with hyperammonemia contributing    Recommendations:  Continue Keppra  1g BID (renally dosed), and Vimpat  100 mg BID   Recommend repeat EEG today to follow prior LPDs  Recommend neurosurgery consult for evaluation of cord signal change on MRI  Agree with continued lactulose /BM per primary team   Consider repeat MRI Brain in 2 weeks (~12/4) to assess for progression/resolution of  previously noted cortical restricted diffusion    We will continue to follow. Please feel free to reach out with any questions or concerns.    This patient was seen and discussed with Dr. Christiane, who agrees with the above assessment and plan.    Recommendations discussed with primary team.     Charmaine Lore, MD  Pediatric Neurology, PGY-3  [facility]        I saw and evaluated the patient, participating in the key portions of the service. I reviewed the resident???s note. I agree with the resident???s findings and plan.     Morna Christiane, MD      HPI        Reason for Consult: c/f seizure    [name] is a 78 y.o. female on whom I have been asked by Morene ONEIDA Dec, MD to consult for c/f seizure.    Interval History 11/23:  - Patient has been stable, but no improvements in exam since 11/20.       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 Also demonstrated moderate canal stenosis at C4-C5, moderate/severe canal stenosis at C6-C7, as well as severe neuroforaminal stenosis at bilateral C5-C6 and left C6-C7.   - Exam today improved from prior, patient with midline gaze, somewhat tracking/regarding, not following commands, but grimaces and semi-localizes to noxious in BUE, and weak w/d to nox in BLE.     Interval History 11/18:   - Overnight was found to have EEG findings c/w IIC (LPDs+F), prompting initiation of LEV+LCS with some transient improvement  - MRI Brain w/wo was obtained, demosntrating diffuse bilateral parieto-occipital restricted diffusion. Case discussed on Neuroradiology case conference, felt to be most compatible with toxic-metabolic primary etiology.   - Re-reviewed overall history given complicated course, as detailed more extensively in A&P  - Exam now with midline gaze and intact OCRs, overall improved from prior    Initial HPI:  78 yo PMHx HFpEF, afib, and MASLD cirrhosis (MELD 15) admitted for respiratory failure, encephalopathy, and septic shock course with c/b hypernatremia.     At around 1930 on 11/17 was noted to have roving horizontal eye movements. Movements were nonrhythmic but persisted for several hours raising primary teams concerns for possible seizure. They gave 4mg  versed  and per their report movements only slowed down for a brief duration.     No reported facial twitching, limb shaking, fixed lateralized gaze, urinary incontinence, or tongue injury. Per chart review, no past history of seizures.     Per nursing report, patient has gradually become less and less responsive over the last 3 days. She was following commands and moving extremities (squeeze hands, wiggle toes) on 11/14, but now is unresponsive and has no spontaneous movement.     Lactulose  was held for intermittent periods starting 11/12 and was discontinued on 11/15 due to increased bowel movement frequency (per chart review had 5 BMs on 11/15). Since worsened mentation on 11/16,  lactulose  was resumed.   Ammonia 291 on 11/16, previously 160 on 10/24    Earlier in ICU stay had E. Coli bacteremia -Finished course of ceftriaxone .     On a CTA abdomen in October 2025 was noted to have a chronic T12 compression fracture.   On a CT cervical spine in May 2024 was noted  to have questionable lucency involving the left lateral aspect of C2 vertebral body.     Has known history of osteoporosis identified on Dexa scan of femoral neck (11/2022 T score -2.9).     Allergies[1]     Current Medications[2] Prescriptions Prior to Admission[3]    Past Medical History[4]    Past Surgical History[5]    Social History[6]    Family History[7]    Code Status: Full Code     Review of Systems     Unable to obtain due to patient's mental status.       Objective        Temp:  [37 ??C (98.6 ??F)-37.1 ??C (98.8 ??F)] 37.1 ??C (98.7 ??F)  Pulse:  [82-108] 91  SpO2 Pulse:  [82-108] 86  Resp:  [16-38] 24  BP: (84-154)/(34-79) 136/62  MAP (mmHg):  [55-99] 86  FiO2 (%):  [30 %] 30 %  SpO2:  [92 %-100 %] 99 %  No intake/output data recorded.      Physical Exam:  General Exam:  General: Lying in bed. No obvious distress.   ENT:  Mucous membranes moist. Oropharynx clear.  Cardiovascular:  Regular rate and rhythm.   Respiratory: trach in place.  Gastrointestinal: soft  Extremities: Edema in upper extremities bilateral.   Edema in lower extremities bilateral.    Skin: No obvious rashes or ecchymoses.    Neurological Exam:  Mental Status  LOC: arouses to voice  Orientation: UTA  Speech: UTA  Briefly tracks/regards  Does not follow commands.     Previously: Unable to arouse    Cranial Nerves  Pupils: PERRL 5mm -> 4mm  Corneals: present  Gaze: Midline gaze, some left gave preference. Previously: slow roving, nonrhythmic, horizontal eye movements   Face Motor: normal and symmetric  Oculocephalic: present  Cough: strong  Gag: strong    Motor:   RUE: Semi-localizes (reaches towards source of noxious) Previously: no response  LUE: Semi-localizes (reaches towards source of nox, crosses midline) Previously:  no response  RLE: Weak withdrawal to nox. Previously: brisk toe and foot extension to light touch over the dorsum of the foot and triple flexion to noxious  LLE: Weak withdrawal to nox. Previously:  toe and foot extension to light touch over the dorsum of the foot and triple flexion to noxious     Sensory:  response to light touch and noxious as above    Reflexes:    Reflexes Right Left   Biceps  C5 3+ 3+   Brachioradialis C6  3+ 3+   Triceps C7 3+ 3+   Patella L3 (4) N/a N/a   Achilles S1 (S2) >4+ beats clonus >4+ beats clonus   Plantar Response Extensor Extensor   Jaw Jerk CN V Absent   Pectoralis C5-T1 Present Present   Hoffman Present Absent   Cross Adductors L2-4 Absent Absent     Glasgow Coma Score  E3VTM5  Previously: E1VTM3     Diagnostic Studies      All Labs Last 24hrs:   Recent Results (from the past 24 hours)   aPTT    Collection Time: 04/23/24 10:40 AM   Result Value Ref Range    APTT 64.6 (H) 24.8 - 38.4 sec    Heparin  Correlation 0.4    Blood Gas, Venous    Collection Time: 04/23/24  3:17 PM   Result Value Ref Range    Specimen Source Venous     FIO2 Venous Not Specified     pH,  Venous 7.40 7.32 - 7.43    pCO2, Ven 42 40 - 60 mm Hg    pO2, Ven 39 30 - 55 mm Hg    HCO3, Ven 25 22 - 27 mmol/L    Base Excess, Ven 0.7 -2.0 - 2.0    O2 Saturation, Venous 63.9 40.0 - 85.0 %    Carboxyhemoglobin, Venous 1.7 (H) <1.2 %    Methemoglobin, Venous <1.0 <1.5 %    Oxyhemoglobin Venous 62.5 40.0 - 85.0 %   Basic Metabolic Panel    Collection Time: 04/23/24  5:42 PM   Result Value Ref Range    Sodium 146 (H) 135 - 145 mmol/L    Potassium 3.8 3.4 - 4.8 mmol/L    Chloride 111 (H) 98 - 107 mmol/L    CO2 26.0 20.0 - 31.0 mmol/L    Anion Gap 9 5 - 14 mmol/L    BUN 17 9 - 23 mg/dL    Creatinine 9.59 (L) 0.55 - 1.02 mg/dL    BUN/Creatinine Ratio 43     eGFR CKD-EPI (2021) Female >90 >=60 mL/min/1.51m2    Glucose 151 70 - 179 mg/dL    Calcium  8.3 (L) 8.7 - 10.4 mg/dL   Basic Metabolic Panel    Collection Time: 04/24/24  3:54 AM   Result Value Ref Range    Sodium 147 (H) 135 - 145 mmol/L    Potassium 3.6 3.4 - 4.8 mmol/L    Chloride 109 (H) 98 - 107 mmol/L    CO2 26.0 20.0 - 31.0 mmol/L    Anion Gap 12 5 - 14 mmol/L    BUN 18 9 - 23 mg/dL    Creatinine 9.57 (L) 0.55 - 1.02 mg/dL    BUN/Creatinine Ratio 43     eGFR CKD-EPI (2021) Female >90 >=60 mL/min/1.24m2    Glucose 131 70 - 179 mg/dL    Calcium  8.3 (L) 8.7 - 10.4 mg/dL   Hepatic Function Panel    Collection Time: 04/24/24  3:54 AM   Result Value Ref Range    Albumin  2.5 (L) 3.4 - 5.0 g/dL    Total Protein 5.5 (L) 5.7 - 8.2 g/dL    Total Bilirubin 0.7 0.3 - 1.2 mg/dL    Bilirubin, Direct 9.59 (H) 0.00 - 0.30 mg/dL    AST 35 (H) <=65 U/L    ALT 15 10 - 49 U/L    Alkaline Phosphatase 179 (H) 46 - 116 U/L   Magnesium  Level    Collection Time: 04/24/24  3:54 AM   Result Value Ref Range    Magnesium  2.0 1.6 - 2.6 mg/dL   Phosphorus Level    Collection Time: 04/24/24  3:54 AM   Result Value Ref Range    Phosphorus 2.7 2.4 - 5.1 mg/dL   Cystatin C    Collection Time: 04/24/24  3:54 AM   Result Value Ref Range    Cystatin C 1.71 (H) 0.64 - 1.23 mg/L    eGFR CKD-EPI (2012) Cystatin C Female 33 (L) >=60 mL/min/1.51m2   Blood Gas, Venous    Collection Time: 04/24/24  3:54 AM   Result Value Ref Range    Specimen Source Venous     FIO2 Venous Not Specified     pH, Venous 7.41 7.32 - 7.43    pCO2, Ven 41 40 - 60 mm Hg    pO2, Ven 40 30 - 55 mm Hg    HCO3, Ven 25 22 - 27 mmol/L    Base  Excess, Ven 1.1 -2.0 - 2.0    O2 Saturation, Venous 64.9 40.0 - 85.0 %    Carboxyhemoglobin, Venous 1.6 (H) <1.2 %    Methemoglobin, Venous <1.0 <1.5 %    Oxyhemoglobin Venous 63.6 40.0 - 85.0 %   APTT    Collection Time: 04/24/24  3:54 AM   Result Value Ref Range    APTT 97.4 (H) 24.8 - 38.4 sec    Heparin  Correlation 0.6      CT head 11/17   No acute abnormalities on personal review    MRI Brain W Wo Contrast, 04/03/24  Impression  Multiple sequences are degraded due to motion which limits evaluation.  Within the limitations no acute intracranial abnormality.  Severe periventricular and deep white matter T2/FLAIR hyperintense signal compatible with small vessel ischemic changes. Multiple microhemorrhages as described. Consider cognitive assessment.  Remote left cerebellar infarct.  Small right mastoid effusion.    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      Moderate to severe central canal stenosis at C6-7 and moderate canal stenosis at C4-C5 secondary to disc osteophyte complex and ligament flavum hypertrophy.      Severe neural foraminal stenosis bilaterally at C5-6. Severe left-sided neural foraminal stenosis at C6-7.                             [1]   Allergies  Allergen Reactions    Rocuronium  Anaphylaxis     Suspected intraoperative anaphylaxis 04/15/2024. See anesthetic from tracheostomy. Tryptase pending at time of listing.     Sulfa (Sulfonamide Antibiotics) Anaphylaxis    Sulfur  Anaphylaxis     Tolerated sulfur  colloid injection without incident 09/03/2020.    Aspirin      thrombocytopenia   [2]   Current Facility-Administered Medications   Medication Dose Route Frequency Provider Last Rate Last Admin    acetaminophen  (TYLENOL ) tablet 650 mg  650 mg Oral Q4H PRN Sines, Benjamin Jacob, MD   650 mg at 04/12/24 1214    cupric chloride  (copper ) 2 mg in sodium chloride  (NS) 0.9 % 250 mL IVPB  2 mg Intravenous Daily Onokalah, Chinonyerem A, MD   Stopped at 04/23/24 1122    cyanocobalamin  (vitamin B-12) tablet 1,000 mcg  1,000 mcg Enteral tube: gastric Daily Sines, Benjamin Jacob, MD   1,000 mcg at 04/23/24 0919    ergocalciferol  (DRISDOL ) oral drops 200 mcg/mL (8,000 unit/mL)  100 mcg Enteral tube: gastric Daily Seilheimer, Waddell HERO, DO   100 mcg at 04/23/24 0920    esomeprazole  (NEXIUM ) granules 40 mg  40 mg Enteral tube: gastric Daily Sines, Morene Cadet, MD   40 mg at 04/23/24 0920    famotidine  (PEPCID ) tablet 20 mg  20 mg Enteral tube: gastric Daily Onokalah, Chinonyerem A, MD   20 mg at 04/23/24 0920    flu vacc 2025-26 (65 yr up) (PF)(FLUAD)45 mcg(59mcgx3)/0.5 ml IM syringe  0.5 mL Intramuscular During hospitalization Sines, Benjamin Jacob, MD        folic acid  (FOLVITE ) tablet 1 mg  1 mg Enteral tube: gastric Daily Sines, Benjamin Jacob, MD   1 mg at 04/23/24 0920    heparin  (porcine) 1000 unit/mL injection 2,000 Units  2,000 Units Intravenous Q6H PRN Sines, Benjamin Jacob, MD        heparin  25,000 Units/250 mL (  100 units/mL) in 0.45% saline infusion (premade)  0-24 Units/kg/hr Intravenous Continuous Seilheimer, Waddell HERO, DO 10.37 mL/hr at 04/24/24 0448 10 Units/kg/hr at 04/24/24 0448    lacosamide  (VIMPAT ) tablet 100 mg  100 mg Enteral tube: gastric BID Maxwell, Chinonyerem A, MD   100 mg at 04/23/24 2159    lactulose  oral solution  30 g Enteral tube: gastric Q4H Sweeney, [name] A, MD   30 g at 04/24/24 0556    levETIRAcetam  (KEPPRA ) tablet 750 mg  750 mg Enteral tube: gastric BID Onokalah, Chinonyerem A, MD   750 mg at 04/23/24 2159    melatonin tablet 3 mg  3 mg Enteral tube: gastric QPM Onokalah, Chinonyerem A, MD   3 mg at 04/23/24 1730    metoPROLOL  tartrate (Lopressor ) tablet 25 mg  25 mg Enteral tube: gastric BID Seilheimer, Waddell HERO, DO   25 mg at 04/23/24 2210    multivitamins, therapeutic with minerals tablet 1 tablet  1 tablet Enteral tube: gastric Daily Sines, Benjamin Jacob, MD   1 tablet at 04/23/24 0920    NORepinephrine  8 mg in dextrose  5 % 250 mL (32 mcg/mL) infusion PMB  0-30 mcg/min Intravenous Continuous Dorise Hove, MD   Paused at 04/19/24 0050    potassium chloride  20 mEq in 100 mL IVPB Premix  20 mEq Intravenous Once Ancell, Kaitlyn R, MD        rifAXIMin  (XIFAXAN ) oral suspension  550 mg Enteral tube: gastric BID Sines, Benjamin Jacob, MD   550 mg at 04/23/24 2159    sodium chloride  (NS) 0.9 % flush 10 mL  10 mL Intravenous Q8H Sines, Benjamin Jacob, MD   10 mL at 04/24/24 0300    sodium chloride  (NS) 0.9 % flush 10 mL  10 mL Intravenous Q8H Sines, Benjamin Jacob, MD   10 mL at 04/24/24 0300    thiamine  mononitrate (vit B1) tablet 100 mg  100 mg Enteral tube: gastric Daily Sines, Morene Cadet, MD   100 mg at 04/23/24 0920    zinc  acetate oral solution  50 mg elem zinc  Enteral tube: gastric Daily Onokalah, Chinonyerem A, MD   50 mg elem zinc  at 04/23/24 1730   [3]   Medications Prior to Admission   Medication Sig Dispense Refill Last Dose/Taking    anastrozole  (ARIMIDEX ) 1 mg tablet Take 1 tablet (1 mg total) by mouth daily. 90 tablet 3 03/10/2024 at  6:00 AM    carvedilol  (COREG ) 3.125 MG tablet Take 1 tablet (3.125 mg total) by mouth two (2) times a day. 180 tablet 2 03/10/2024 Morning    folic acid  (FOLVITE ) 1 MG tablet Take 1 tablet (1 mg total) by mouth daily.   03/10/2024 at  6:00 AM    furosemide  (LASIX ) 40 MG tablet Take 1 tablet (40 mg total) by mouth daily as needed. 30 tablet 11 03/10/2024 at  6:00 AM    magnesium  oxide (MAG-OX) 400 mg (241.3 mg elemental magnesium ) tablet Take 1 tablet (400 mg total) by mouth two (2) times a day. 180 tablet 3 03/10/2024 at  6:00 AM    potassium chloride  20 MEQ ER tablet Take 1 tablet (20 mEq total) by mouth two (2) times a day. 60 tablet 11 03/09/2024 at  6:00 AM    spironolactone  (ALDACTONE ) 25 MG tablet Take 1 tablet (25 mg total) by mouth daily. 90 tablet 1 03/10/2024 at  6:00 AM    thiamine  (B-1) 100 MG tablet Take 1 tablet (100 mg total) by mouth daily.  03/10/2024 Morning    vitamin E-268 mg, 400 UNIT, 268 mg (400 UNIT) capsule Take 800 mg by mouth in the morning. 2 tabs.   03/10/2024 Morning    acetaminophen  (TYLENOL ) 325 MG tablet Take 2 tablets (650 mg total) by mouth every six (6) hours as needed for pain.  0     [Paused] alendronate  (FOSAMAX ) 70 MG tablet Take 1 tablet (70 mg total) by mouth every seven (7) days. (Patient not taking: Reported on 03/07/2024) 12 tablet 3     calcium  carbonate (OS-CAL) 1,250 mg (500 mg elem calcium ) tablet Take 1 tablet (500 mg elem calcium  total) by mouth in the morning. (Patient not taking: Reported on 03/07/2024)       cyanocobalamin  1000 MCG tablet Take 1 tablet (1,000 mcg total) by mouth daily.       [EXPIRED] nystatin  (MYCOSTATIN ) 100,000 unit/gram powder Apply to affected area 3 times daily 15 g 0    [4]   Past Medical History:  Diagnosis Date    A-fib (CMS-HCC)     Alcoholism    (CMS-HCC)     Alcoholism /alcohol abuse     Cirrhosis    (CMS-HCC)     Depression     Hypertension     Liver disease     Malignant neoplasm of overlapping sites of right breast in female, estrogen receptor positive    (CMS-HCC) 10/03/2020   [5]   Past Surgical History:  Procedure Laterality Date    BREAST BIOPSY Right     benign-a long time ago    BREAST BIOPSY Right 07/2020    malignant    BREAST LUMPECTOMY Right     4 2022    CENTRAL LINE  03/25/2024    CHOLECYSTECTOMY      PR BX/REMV,LYMPH NODE,DEEP AXILL Right 09/03/2020    Procedure: BX/EXC LYMPH NODE; OPEN, DEEP AXILRY NODE;  Surgeon: Alm Elsie Como, MD;  Location: ASC OR [facility];  Service: Surgical Oncology Breast    PR CARDIOVERSION, ELECTIVE;EXTERN N/A 03/10/2024    Procedure: CARDIOVERSION, ELECTIVE, ELECTRICAL CONVERSION OF ARRHYTHMIA; EXTERNAL;  Surgeon: Sedalia Velma Hamilton, MD;  Location: [facility] OR [facility];  Service: Cardiology    PR ECHO HEART,TRANSESOPHAGEAL,COMPLETE Midline 03/10/2024    Procedure: ECHOCARDIOGRAPHY, TRANSESOPHAGEAL, REAL-TIME WITH IMAGE DOCUMENTATION;  Surgeon: Sedalia Velma Hamilton, MD;  [facility];  Service: Cardiology    PR INTRAOPERATIVE SENTINEL LYMPH NODE ID W DYE INJECTION Right 09/03/2020    Procedure: INTRAOPERATIVE IDENTIFICATION SENTINEL LYMPH NODE(S) INCLUDE INJECTION NON-RADIOACTIVE DYE, WHEN PERFORMED;  Surgeon: Alm Elsie Como, MD;  Location: ASC OR [facility];  Service: Surgical Oncology Breast    PR MASTECTOMY, PARTIAL Right 09/03/2020    Procedure: MASTECTOMY, PARTIAL (EG, LUMPECTOMY, TYLECTOMY, QUADRANTECTOMY, SEGMENTECTOMY);  Surgeon: Alm Elsie Como, MD;  Location: ASC OR [facility];  Service: Surgical Oncology Breast    PR TRACHEOSTOMY, PLANNED N/A 04/15/2024    Procedure: TRACHEOSTOMY PLANNED (SEPART PROC);  Surgeon: Pixie Loader, MD;  Location: OR [facility];  Service: ENT    PR UPPER GI ENDOSCOPY,BIOPSY N/A 02/03/2018    Procedure: UGI ENDOSCOPY; WITH BIOPSY, SINGLE OR MULTIPLE;  Surgeon: Eleanor Dewey Sorrel, MD;  Location: [facility] PROCED[facility];  Service: Gastroenterology    RADIATION Right     unsure finish date    TUBAL LIGATION     [6]   Social History  Socioeconomic History    Marital status: Married     Spouse name: None    Number of children: None  Years of education: None    Highest education level: None   Tobacco Use    Smoking status: Never     Passive exposure: Past    Smokeless tobacco: Never   Vaping Use    Vaping status: Never Used   Substance and Sexual Activity    Alcohol use: Not Currently    Drug use: Never    Sexual activity: Not Currently   Social History Narrative    Daughter fills med boxes weekly.      Social Drivers of Health     Food Insecurity: No Food Insecurity (03/02/2024)    Hunger Vital Sign     Worried About Running Out of Food in the Last Year: Never true     Ran Out of Food in the Last Year: Never true   Tobacco Use: Low Risk (03/30/2024)    Patient History     Smoking Tobacco Use: Never     Smokeless Tobacco Use: Never     Passive Exposure: Past   Transportation Needs: No Transportation Needs (03/02/2024)    PRAPARE - Transportation     Lack of Transportation (Medical): No     Lack of Transportation (Non-Medical): No   Alcohol Use: Not At Risk (04/20/2023)    Alcohol Use     How often do you have a drink containing alcohol?: Never     How many drinks containing alcohol do you have on a typical day when you are drinking?: 1 - 2     How often do you have 5 or more drinks on one occasion?: Never   Housing: Low Risk (03/02/2024)    Housing     Within the past 12 months, have you ever stayed: outside, in a car, in a tent, in an overnight shelter, or temporarily in someone else's home (i.e. couch-surfing)?: No     Are you worried about losing your housing?: No   Physical Activity: Inactive (04/20/2023)    Exercise Vital Sign     Days of Exercise per Week: 0 days     Minutes of Exercise per Session: 0 min   Utilities: Low Risk (04/20/2023)    Utilities     Within the past 12 months, have you been unable to get utilities (heat, electricity) when it was really needed?: No   Stress: No Stress Concern Present (04/20/2023)    [employer] of [employer] - Occupational Stress Questionnaire     Feeling of Stress : Only a little   Interpersonal Safety: Patient Unable To Answer (03/11/2024)    Interpersonal Safety     Unsafe Where You Currently Live: Patient unable to answer     Physically Hurt by Anyone: Patient unable to answer     Abused by Anyone: Patient unable to answer   Social Connections: Moderately Isolated (04/20/2023)    Social Connection and Isolation Panel     Frequency of Communication with Friends and Family: More than three times a week     Frequency of Social Gatherings with Friends and Family: More than three times a week     Attends Religious Services: Never     Database Administrator or Organizations: No     Attends Banker Meetings: Never     Marital Status: Married   Programmer, Applications: Low Risk (03/02/2024)    Overall Financial Resource Strain (CARDIA)     Difficulty of Paying Living Expenses: Not hard at all   Health Literacy: Low Risk (04/20/2023)  Health Literacy     : Never   Internet Connectivity: No Internet connectivity concern identified (04/20/2023)    Internet Connectivity     Do you have access to internet services: Yes     How do you connect to the internet: Personal Device at home     Is your internet connection strong enough for you to watch video on your device without major problems?: Yes     Do you have enough data to get through the month?: Yes     Does at least one of the devices have a camera that you can use for video chat?: Yes   [7]   Family History  Problem Relation Age of Onset    Heart disease Mother     No Known Problems Father     No Known Problems Sister     No Known Problems Daughter     No Known Problems Maternal Grandmother     No Known Problems Maternal Grandfather     No Known Problems Paternal Grandmother     No Known Problems Paternal Grandfather     Lupus Brother     No Known Problems Other     BRCA 1/2 Neg Hx     Breast cancer Neg Hx     Cancer Neg Hx     Colon cancer Neg Hx     Endometrial cancer Neg Hx     Ovarian cancer Neg Hx     Mental illness Neg Hx     Substance Abuse Disorder Neg Hx

## 2024-04-24 NOTE — Progress Notes (Signed)
 MICU Daily Progress Note     Date of Service: 04/24/2024    Problem List:   Principal Problem:    Bacteremia, escherichia coli  Active Problems:    Alcoholic liver disease (HHS-HCC)    HTN (hypertension)    Depression with anxiety    Class 2 severe obesity with serious comorbidity in adult    Atrial fibrillation    (CMS-HCC)    Pleural effusion    Hypernatremia    Thrombocytopenia    Cirrhosis    (CMS-HCC)    A-fib (CMS-HCC)    Hepatic encephalopathy    (CMS-HCC)    Anaphylaxis      Interval history: [name] is a 78 y.o. female with HFpEF, HTN, Afib on AC, MASLD cirrhosis, originally hospitalized for scheduled TEE/DCCV (aborted d/t LAA clot) c/b AHHRF, encephalopathy and septic shock requiring MICU care, was s/p extubation transferred to [facility] for further management of anticoagulation and hypernatremia. Transferred back to [facility] MICU for closer monitoring for respiratory status, pressor requirement, GI bleed, and management of carotid artery injury.     On 10/24, patient had acute worsening of respiratory status leading requiring intubation. When patient was given sedation for intubation, she coded and promptly had ROSC after a few minutes of CPR. Subsequently, CVC placed in neck, used to draw blood, unclear if given pressors through it, then found  to be in arterial system based on blood gas and imaging, likely right carotid. Line was subsequently removed, and pressure was held for 15 minutes, hemostasis with no hematoma. She was transferred to [facility] MICU for management of this carotid injury, to be evaluated by vascular surgery while also managing her increased vasopressor requirement and respiratory distress.  Has been weaned off pressors since 10/27 at 1 PM. VBG's indicating that she is ready for extubation, however her mentation suggests that she is not able to protect her airway.  She was extubated on 10/31, and mentation slowly improved.  Unfortunately on the morning of 11/2 she had increased lethargy, increased secretions, and she was not protecting her airway.  She was reintubated on 11/2, and required pressor support with norepinephrine  and vasopressin  after the induction agents.  Self extubated on 11/5. Re intubated on 11/6 due to hypercarbia and increased work of breathing.  We are left with the challenging options regarding the care of [name].  Due to her multiple failed extubations, we are concerned [name] will need a tracheostomy long-term to protect her airway.  There is a meeting with palliative care and family on 04/13/2024, her family agreed for tracheostomy.  Tracheostomy placed 11/14.  On 11/16, patient started presenting with altered mental status as she became hard to arouse and stopped following commands.  Sepsis workup was initiated.  She also had a lateral gaze on exam, so neurology was consulted.  EEG showed seizure activity and MRI brain showed diffuse cortical restricted diffusion greatest within the bilateral parietal -occipital regions.  Her ammonia level is also elevated to 291.  It is unclear if seizures are related to her ammonia level.  Patient placed on Keppra  and Vimpat .    Plans for family meeting next week to discuss goals of care.  Palliative is involved.    24 hour events:   -lasix  40 mg  - Family meeting will be week of December 1  - Neurology would like patient back on EEG since she has had minimal clinical improvement    Neurological   Hepatic encephalopathy - Non-Convulsive Seizures  Rapid response called on 10/24 at [facility] for somnolence. Ddx  hepatic encephalopathy, toxic metabolic encephalopathy in the setting of shock (possibly sepsis), intracranial pathology. Intubated.  No acute abnormality on 10/24 head CT. Failed extubation attempts and tracheostomy placed on 11/14. Overnight on 04/18/2024, patient noted to have eye deviation along with decreased responsiveness on physical exam.   Head CT reassuring.  MRI brain with diffuse cortical restricted diffusion greatest within the bilateral parieto-occipital regions, unclear etiology. 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 Neurology consulted.  EEG with evidence of seizure activity, therefore started on Keppra  and vimpat . Ammonia significantly elevated. Clinical status changed in the setting of missed lactulose  doses (due to copious stool output) - suspect possible hyperammonemia leading to seizures. Some improvement of mental status with resuming lactulose  and adequate stool output.   - Neurology following, appreciate recommendations  - Plan to obtain MRI brain in 2 weeks (~12/4)   - Lactulose  30g q4h  - Rifaximin  550 mg twice daily   - Continue Keppra  750 mg twice daily (renally dosed)  - Continue Vimpat  100 mg twice daily  - Continue to monitor mental status  - Goals of care meeting hopefully next week with family, palliative on board   -opens eyes to stimulation, does not follow commands, minimally moves upper extremities    Analgesia: No pain issues  RASS at goal? N/A, not on sedation  [place] Agitation Assessment Scale (RASS) : -1 (04/23/2024  4:00 PM)    Acute Hypoxic Hypercarbic Respiratory Failure   Rapid response called on 10/24 for increased somnolence, found to have acute hypercarbic respiratory failure and subsequently intubated. Suspect hypercarbic respiratory failure due to encephalopathy as above. CXR on 10/24 showed worsening pleural effusions. Given patient's hypoxia, and AMS, there was concern for infectious etiology.  Infectious workup overall negative. Unable to protect her airway due to her current mentation.  Reintubated on 11/2 followed by therapeutic bronchoscopy.  Self extubated on 11/5.  Reintubate noted on 11/6 due to hypercarbia and not protecting her airway. Trach placed 11/14, complicated by anaphylactic reaction to rocuronium  which was treated with epi, benadryl  and steroids. Allergy added to chart.  First trach change completed on 04/20/2024 by ENT.  - Continue mechanical ventilation, weaning as tolerated  - Trial of high flow trach collar as tolerated  -  VBG q12  -gases stable this AM  - Did not tolerate high flow trach collar today, became tachypneic and had increased work of breathing    Vent Mode: PRVC  FiO2 (%): 30 %  S RR: 15  S VT: 330 mL  PEEP: 5 cm H20  PR SUP: 12 cm H20      Cardiovascular   Carotid Artery Injury (resolved)  At [facility], CVC placed intended for RIJ on 10/24 following intubation, PEA, ROSC. Was used with known blood return. Unclear if it was used for pressor support, but highly likely that it was. CXR for placement check showed concern for intra aterial location, and blood gas confirmed it was arterial. Thought to be in carotid artery. CVC removed with pressure held for 15 minutes, with no hematoma formation. On arrival, no physical exam and bedside ultrasound showed no large hematoma. No active bleeding.  Vascular Surgery consulted, stated fistula has healed based on Ultrasound findings.  - CTM    PEA arrest s/p ROSC  Hypovolemic/Hemorrhagic shock (resolved)  Atrial fibrillation  known left atrial appendage clot  HFpEF  Patient with PEA arrest peri-intubation with ROSC. Shock thought to be hemorrhagic  in the setting of GI bleed History of atrial fibrillation with known LAA clot and HFpEF. Vasopressors were weaned off 10/27. APTT was elevated the morning of 10/28 necessitating changing anticoagulation from therapeutic heparin  to therapeutic Lovenox  .   -Metoprolol  25 mg twice daily    Renal   Abnormal Electrolytes - Stable Hypernatremia  At [facility], had persistently hypernatremia near 153. Had been treated with D5 gtt. Sodium throughout admission has largely been 146-148. Sodium has overall normalized and continues to be within normal limits.  - Strict I/O's  - BMP daily  - Will give another dose of IV Lasix  today as she is volume overloaded and has minimal urine output (this will be third day in a row of IV Lasix  40 mg)  - Magnesium  and potassium repleted this morning    Infectious Disease/Autoimmune   E. Coli Bacteremia (resolved),   Initially admitted for afib, found to have E. coli bacteremia on October 10. This was treated with cefazolin . Rapid response called 10/24, patient noted to be hypothermic. WBCs jumped from 3.0 to 9.7 on 10/24. Although the most recent lab was following intubation and CPR. UA with pyuria, rare bacteria, hematuria. Peri-intubation, patient developed significant hypotension requiring initiation of NE and vasopressin . Suspect possible septic shock with unknown infectious source. Patient started on vancomycin  and cefepime  10/24. CT Abdomen pelvis concerning for aspiration pneumonia. Following infectious workup was unremarkable. Antibiotics were stopped on 10/27.  Infectious workup was repeated on 11/3 due to reintubation and status post bronchoscopy.  Completed course of ceftriaxone .  -CTM    Cultures:  Blood Culture, Routine (no units)   Date Value   04/17/2024 No Growth at 5 days   04/17/2024 No Growth at 5 days     Urine Culture, Comprehensive (no units)   Date Value   04/17/2024 NO GROWTH   04/09/2024 NO GROWTH     Lower Respiratory Culture (no units)   Date Value   04/10/2024 2+ Methicillin-Susceptible Staphylococcus aureus (A)   04/10/2024 1+ Oropharyngeal Flora Isolated     WBC (10*9/L)   Date Value   04/23/2024 4.4     WBC, UA (/HPF)   Date Value   04/17/2024 1       FEN/GI   Sigmoid Mass c/f Malignancy and Hematochezia (resolved)  Hemorrhagic Shock (resolved)  CT abdomen pelvis done 10/24 with evidence of cirrhosis and masslike thickening of the lower sigmoid colon concerning for malignancy.  CTA abdomen pelvis performed 1024 with active extravasation into the gastric fundus possibly secondary to traumatic enteric tube placement.  GI scoped the patient on 10/25 and clipped an area of active bleeding.  They removed a total of 1.5 L of blood.  Hemoglobin 10/20 6 AM downtrended to 6.0, so 2 units of blood transfused.  GI stated this was consistent with 10/25 scope and likely did not represent increased bleeding. Hb steadily improving with Hb of 9.2 on 10/28.   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 Given shock of unclear etiology. CTAP demonstrating cirrhosis. Likely that cirrhosis was contributing to hypotension.  Worsening altered mental status with seizure-like activity in the setting of hyperammonemia after missing 2-3 lactulose  doses due to copious stool output.  Evidence of seizure activity on EEG.  Mental status with slow improvement once lactulose  was resumed.  -  Continue lactulose  30 g every 4 hours  - Goal stool output 2-4 L daily  - Daily MELD labs and CMP    Vitamin Deficiencies  Low vitamin D , copper , and zinc .  -Appreciate dietitian recommendations  - Continue tube feeds (currently at goal)  - Zinc  repletion  - Multivitamin 1 tablet daily  - Folic acid  1 mg daily  - Copper  2 mg IV  - Vitamin B12 1000 mcg daily     MELD 3.0: 15 at 04/05/2024  4:31 AM  MELD-Na: 13 at 04/05/2024  4:31 AM  Calculated from:  Serum Creatinine: 0.43 mg/dL (Using min of 1 mg/dL) at 88/09/7972  5:68 AM  Serum Sodium: 143 mmol/L (Using max of 137 mmol/L) at 04/05/2024  4:31 AM  Total Bilirubin: 1.3 mg/dL at 88/09/7972  5:68 AM  Serum Albumin : 2.4 g/dL at 88/09/7972  5:68 AM  INR(ratio): 1.61 at 04/03/2024 10:26 AM  Age at listing (hypothetical): 78 years  Sex: Female at 04/05/2024  4:31 AM    Provider Malnutrition Assessment:  Body mass index is 41.76 kg/m??. BMI Interpretation: >/= 30 and < 40, consistent with obesity, clinically significant requiring additional resources and complicating multiple aspects of patient care.  GLIM criteria:   Pt does not meet criteria  -I have screened this patient for malnutrition and they did NOT meet criteria for malnutrition based on GLIM criteria.  -TF and FWF; Dietician consulted, appreciate assistance    Heme/Coag   Acute blood loss anemia  Hemoglobin down trended to 6.0 on the morning of 10/26 thought to be secondary to acute GI bleed that was scoped and clipped 10/25.  H/H stable.  -CBC daily  - Maintain active T&S    Endocrine   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)   - home anastrozole     Integumentary   NAI   #  - WOCN consulted for high risk skin assessment No. Reason: Not indicated.    Prophylaxis/LDA/Restraints/Consults   ICU Checklist completed: yes (see ICU rounding navigator in Epic)    Patient Lines/Drains/Airways Status       Active Active Lines, Drains, & Airways       Name Placement date Placement time Site Days    Tracheostomy Shiley 6 04/20/24  1430  6  3    NG/OG Tube Feedings 10 Fr. Right nostril 04/01/24  1138  Right nostril  22    External Urinary Device 04/13/24 With Suction 04/13/24  1700  -- 10    PICC Double Lumen 04/06/24 Left Basilic 04/06/24  1558  Basilic  17    Peripheral IV 88/79/74 Right Wrist 04/21/24  0830  Wrist  2                  Patient Lines/Drains/Airways Status       Active Wounds       Name Placement date Placement time Site Days    Wound 03/13/24 Irritant Contact Dermatitis Incontinence Sacrum Mid gluteal cleft MASD? 03/13/24  --  Sacrum  42                  Goals of Care     Code Status:   Orders Placed This Encounter   Procedures    Full Code     Standing Status:   Standing     Number of Occurrences:   1        Public Relations Account Executive Maker:  [name] designated healthcare  decision maker(s) is/are   HCDM (patient stated preference): Ciotti,[name] - Spouse - [phone]    HCDM (patient stated preference): Gengler,[name] - Daughter - [phone]. See HCDM section of Epic sidebar/storyboard or ACP tab in patient chart for details regarding active HCDMs and patient capacity for decision-making.      Subjective     Intubated. Withdraws to pain. Opens eyes to stimulation.  Unable to reliably follow commands.    Objective     Vitals - past 24 hours  Temp:  [37.1 ??C (98.8 ??F)] 37.1 ??C (98.8 ??F)  Pulse:  [82-108] 97  SpO2 Pulse:  [82-108] 90  Resp:  [17-38] 25  BP: (84-154)/(34-79) 106/48  FiO2 (%):  [30 %] 30 %  SpO2:  [90 %-100 %] 97 % Intake/Output  I/O last 3 completed shifts:  In: 3770.3 [P.O.:180; I.V.:20; NG/GT:2940; IV Piggyback:630.3]  Out: 3400 [Urine:800; Stool:2600]     Physical Exam:    General: Intubated.   HEENT: normocephalic, atraumatic.  Trach in place.   CV: pulses palpable, regular borderline tachycardia   Pulm: Rhonchi w transmitted upper airway sounds throughout  GI: soft, NTND, + BS  MSK: Bilateral LE pitting edema with weeping over left lower extremity  Skin: Moist skin breakdown around trach.  Neuro: Withdrawals to painful stimuli.  Not following commands.  Reflexes intact    Continuous Infusions:   Infusions Meds[1]    Scheduled Medications:   Scheduled Medications[2]    PRN medications:  PRN Medications[3]    Data/Imaging Review: Reviewed in Epic and personally interpreted on 04/24/2024. See EMR for detailed results.    Waddell CHRISTELLA Mead, DO  PGY-1, Emergency Medicine             [1]    heparin  10 Units/kg/hr (04/24/24 0448)    NORepinephrine  bitartrate-NS Stopped (04/19/24 0050)   [2]    cupric chloride  (copper ) 2 mg in sodium chloride  (NS) 0.9 % 250 mL IVPB  2 mg Intravenous Daily    cyanocobalamin  (vitamin B-12)  1,000 mcg Enteral tube: gastric Daily    ergocalciferol   100 mcg Enteral tube: gastric Daily    esomeprazole   40 mg Enteral tube: gastric Daily    famotidine   20 mg Enteral tube: gastric Daily    flu vac 2025 [id number](PF)  0.5 mL Intramuscular During hospitalization    folic acid   1 mg Enteral tube: gastric Daily    lacosamide   100 mg Enteral tube: gastric BID    lactulose   30 g Enteral tube: gastric Q4H    levETIRAcetam   750 mg Enteral tube: gastric BID    melatonin  3 mg Enteral tube: gastric QPM    metoPROLOL  tartrate  25 mg Enteral tube: gastric BID    multivitamins (ADULT)  1 tablet Enteral tube: gastric Daily    potassium chloride  in water   20 mEq Intravenous Once    Followed by    potassium chloride  in water   20 mEq Intravenous Once    rifAXIMin   550 mg Enteral tube: gastric BID    sodium chloride   10 mL Intravenous Q8H    sodium chloride   10 mL Intravenous Q8H    thiamine  mononitrate (vit B1)  100 mg Enteral tube: gastric Daily    zinc  acetate  50 mg elem zinc  Enteral tube: gastric Daily   [3] acetaminophen , heparin  (porcine)

## 2024-04-24 NOTE — Consults (Signed)
 Additional order received and the patient is already on the caseload. Continue with plan of care.

## 2024-04-25 LAB — BLOOD GAS, VENOUS
BASE EXCESS VENOUS: 0.3 (ref -2.0–2.0)
BASE EXCESS VENOUS: -0.6 (ref -2.0–2.0)
CARBOXYHEMOGLOBIN, VENOUS: 1.8 % — ABNORMAL HIGH (ref <1.2)
CARBOXYHEMOGLOBIN, VENOUS: 1.6 % — ABNORMAL HIGH (ref <1.2)
HCO3 VENOUS: 25 mmol/L (ref 22–27)
HCO3 VENOUS: 24 mmol/L (ref 22–27)
METHEMOGLOBIN, VENOUS: <1 % (ref <1.5)
METHEMOGLOBIN, VENOUS: <1 % (ref <1.5)
O2 SATURATION VENOUS: 68.2 % (ref 40.0–85.0)
O2 SATURATION VENOUS: 62.2 % (ref 40.0–85.0)
OXYHEMOGLOBIN, VENOUS: 66.7 % (ref 40.0–85.0)
OXYHEMOGLOBIN, VENOUS: 61 % (ref 40.0–85.0)
PCO2 VENOUS: 42 mmHg (ref 40–60)
PCO2 VENOUS: 43 mmHg (ref 40–60)
PH VENOUS: 7.38 (ref 7.32–7.43)
PH VENOUS: 7.37 (ref 7.32–7.43)
PO2 VENOUS: 41 mmHg (ref 30–55)
PO2 VENOUS: 39 mmHg (ref 30–55)

## 2024-04-25 LAB — MAGNESIUM: MAGNESIUM: 1.9 mg/dL (ref 1.6–2.6)

## 2024-04-25 LAB — BASIC METABOLIC PANEL
ANION GAP: 10 mmol/L (ref 5–14)
BLOOD UREA NITROGEN: 17 mg/dL (ref 9–23)
BUN / CREAT RATIO: 41
CALCIUM: 8.5 mg/dL — ABNORMAL LOW (ref 8.7–10.4)
CHLORIDE: 110 mmol/L — ABNORMAL HIGH (ref 98–107)
CO2: 25 mmol/L (ref 20.0–31.0)
CREATININE: 0.41 mg/dL — ABNORMAL LOW (ref 0.55–1.02)
EGFR CKD-EPI (2021) FEMALE: >90 mL/min/1.73m2 (ref >=60)
GLUCOSE RANDOM: 129 mg/dL (ref 70–179)
POTASSIUM: 3.7 mmol/L (ref 3.4–4.8)
SODIUM: 145 mmol/L (ref 135–145)

## 2024-04-25 LAB — CBC W/ AUTO DIFF
BASOPHILS ABSOLUTE COUNT: 0 10*9/L (ref 0.0–0.1)
BASOPHILS RELATIVE PERCENT: 0.8 %
EOSINOPHILS ABSOLUTE COUNT: 0.2 10*9/L (ref 0.0–0.5)
EOSINOPHILS RELATIVE PERCENT: 4.2 %
HEMATOCRIT: 26.3 % — ABNORMAL LOW (ref 34.0–44.0)
HEMOGLOBIN: 8.5 g/dL — ABNORMAL LOW (ref 11.3–14.9)
LYMPHOCYTES ABSOLUTE COUNT: 1.6 10*9/L (ref 1.1–3.6)
LYMPHOCYTES RELATIVE PERCENT: 31.1 %
MEAN CORPUSCULAR HEMOGLOBIN CONC: 32.5 g/dL (ref 32.0–36.0)
MEAN CORPUSCULAR HEMOGLOBIN: 31.5 pg (ref 25.9–32.4)
MEAN CORPUSCULAR VOLUME: 97.1 fL — ABNORMAL HIGH (ref 77.6–95.7)
MEAN PLATELET VOLUME: 9.2 fL (ref 6.8–10.7)
MONOCYTES ABSOLUTE COUNT: 0.7 10*9/L (ref 0.3–0.8)
MONOCYTES RELATIVE PERCENT: 13.2 %
NEUTROPHILS ABSOLUTE COUNT: 2.6 10*9/L (ref 1.8–7.8)
NEUTROPHILS RELATIVE PERCENT: 50.7 %
PLATELET COUNT: 170 10*9/L (ref 150–450)
RED BLOOD CELL COUNT: 2.7 10*12/L — ABNORMAL LOW (ref 3.95–5.13)
RED CELL DISTRIBUTION WIDTH: 19.3 % — ABNORMAL HIGH (ref 12.2–15.2)
WBC ADJUSTED: 5.1 10*9/L (ref 3.6–11.2)

## 2024-04-25 LAB — HEPATIC FUNCTION PANEL
ALBUMIN: 2.4 g/dL — ABNORMAL LOW (ref 3.4–5.0)
ALKALINE PHOSPHATASE: 172 U/L — ABNORMAL HIGH (ref 46–116)
ALT (SGPT): 16 U/L (ref 10–49)
AST (SGOT): 33 U/L (ref <=34)
BILIRUBIN DIRECT: 0.4 mg/dL — ABNORMAL HIGH (ref 0.00–0.30)
BILIRUBIN TOTAL: 0.7 mg/dL (ref 0.3–1.2)
PROTEIN TOTAL: 5.5 g/dL — ABNORMAL LOW (ref 5.7–8.2)

## 2024-04-25 LAB — APTT
APTT: 73.7 s — ABNORMAL HIGH (ref 24.8–38.4)
HEPARIN CORRELATION: 0.4

## 2024-04-25 LAB — VITAMIN B6: VITAMIN B6: 12 ug/L

## 2024-04-25 LAB — PHOSPHORUS: PHOSPHORUS: 2.9 mg/dL (ref 2.4–5.1)

## 2024-04-25 LAB — CYSTATIN C
CYSTATIN C: 1.65 mg/L — ABNORMAL HIGH (ref 0.64–1.23)
EGFR CKD-EPI (2012) CYSTATIN C FEMALE: 35 mL/min/1.73m2 — ABNORMAL LOW (ref >=60)

## 2024-04-25 LAB — SLIDE REVIEW

## 2024-04-25 MED ADMIN — sodium chloride (NS) 0.9 % flush 10 mL: 10 mL | INTRAVENOUS | @ 17:00:00

## 2024-04-25 MED ADMIN — sodium chloride (NS) 0.9 % flush 10 mL: 10 mL | INTRAVENOUS | @ 23:00:00

## 2024-04-25 MED ADMIN — sodium chloride (NS) 0.9 % flush 10 mL: 10 mL | INTRAVENOUS | @ 08:00:00

## 2024-04-25 MED ADMIN — rifAXIMin (XIFAXAN) oral suspension: 550 mg | GASTROENTERAL | @ 14:00:00 | Stop: 2025-04-11

## 2024-04-25 MED ADMIN — rifAXIMin (XIFAXAN) oral suspension: 550 mg | GASTROENTERAL | @ 02:00:00 | Stop: 2025-04-11

## 2024-04-25 MED ADMIN — folic acid (FOLVITE) tablet 1 mg: 1 mg | GASTROENTERAL | @ 14:00:00

## 2024-04-25 MED ADMIN — metoPROLOL tartrate (Lopressor) tablet 25 mg: 25 mg | GASTROENTERAL | @ 02:00:00

## 2024-04-25 MED ADMIN — metoPROLOL tartrate (Lopressor) tablet 25 mg: 25 mg | GASTROENTERAL | @ 14:00:00

## 2024-04-25 MED ADMIN — lactulose oral solution: 30 g | GASTROENTERAL | @ 10:00:00

## 2024-04-25 MED ADMIN — lactulose oral solution: 30 g | GASTROENTERAL | @ 07:00:00

## 2024-04-25 MED ADMIN — lactulose oral solution: 30 g | GASTROENTERAL | @ 14:00:00

## 2024-04-25 MED ADMIN — lactulose oral solution: 30 g | GASTROENTERAL | @ 20:00:00

## 2024-04-25 MED ADMIN — lactulose oral solution: 30 g | GASTROENTERAL | @ 23:00:00

## 2024-04-25 MED ADMIN — lactulose oral solution: 30 g | GASTROENTERAL | @ 03:00:00

## 2024-04-25 MED ADMIN — thiamine mononitrate (vit B1) tablet 100 mg: 100 mg | GASTROENTERAL | @ 14:00:00

## 2024-04-25 MED ADMIN — ergocalciferol (DRISDOL) oral drops 200 mcg/mL (8,000 unit/mL): 100 ug | GASTROENTERAL | @ 14:00:00 | Stop: 2024-05-21

## 2024-04-25 MED ADMIN — cyanocobalamin (vitamin B-12) tablet 1,000 mcg: 1000 ug | GASTROENTERAL | @ 14:00:00

## 2024-04-25 MED ADMIN — levETIRAcetam (KEPPRA) tablet 750 mg: 750 mg | GASTROENTERAL | @ 14:00:00

## 2024-04-25 MED ADMIN — levETIRAcetam (KEPPRA) tablet 750 mg: 750 mg | GASTROENTERAL | @ 02:00:00

## 2024-04-25 MED ADMIN — famotidine (PEPCID) tablet 20 mg: 20 mg | GASTROENTERAL | @ 14:00:00

## 2024-04-25 MED ADMIN — melatonin tablet 3 mg: 3 mg | GASTROENTERAL | @ 23:00:00

## 2024-04-25 MED ADMIN — apixaban (ELIQUIS) tablet 5 mg: 5 mg | GASTROENTERAL | @ 17:00:00

## 2024-04-25 MED ADMIN — zinc acetate oral solution: 50 mg | GASTROENTERAL | @ 23:00:00 | Stop: 2024-05-06

## 2024-04-25 MED ADMIN — esomeprazole (NEXIUM) granules 40 mg: 40 mg | GASTROENTERAL | @ 14:00:00

## 2024-04-25 MED ADMIN — multivitamins, therapeutic with minerals tablet 1 tablet: 1 | GASTROENTERAL | @ 14:00:00

## 2024-04-25 MED ADMIN — lacosamide (VIMPAT) tablet 100 mg: 100 mg | GASTROENTERAL | @ 15:00:00

## 2024-04-25 MED ADMIN — lacosamide (VIMPAT) tablet 100 mg: 100 mg | GASTROENTERAL | @ 02:00:00

## 2024-04-25 MED ADMIN — cupric chloride (copper) 2 mg in sodium chloride (NS) 0.9 % 250 mL IVPB: 2 mg | INTRAVENOUS | @ 14:00:00 | Stop: 2024-04-27

## 2024-04-25 MED ADMIN — heparin 25,000 Units/250 mL (100 units/mL) in 0.45% saline infusion (premade): 0-24 [IU]/kg/h | INTRAVENOUS | @ 15:00:00 | Stop: 2024-04-25

## 2024-04-25 MED ADMIN — potassium chloride 20 mEq in 100 mL IVPB Premix: 20 meq | INTRAVENOUS | @ 13:00:00 | Stop: 2024-04-25

## 2024-04-25 MED ADMIN — potassium chloride 20 mEq in 100 mL IVPB Premix: 20 meq | INTRAVENOUS | @ 11:00:00 | Stop: 2024-04-25

## 2024-04-25 NOTE — Procedures (Addendum)
 DAILY PRELIMINARY INTERIM LONGTERM VIDEO EEG MONITORING NOTE    Identifying Information   NAME: [name]    MRN: [id number]   DOB: Jul 06, 1945    LOC: 4326/4326-01    HISTORY: 78 y.o. female with history of HFpEF, afib, and MASLD cirrhosis admitted with respiratory failure, hepatic encephalopathy, and septic shock course now with seizure-like symptoms observed.     INDICATION:  Evaluate for Electrographic Seizures and Seizures     PATIENT STATE: Somnolent    Study Information  EEG Start: April 25, 2024 at 13:18  EEG End: April 25, 2024 at 19:46    Dr. Payton Marker from 04/25/24 at 13:18 to 04/25/24 at 17:00  Dr. Argentina Romano from 04/25/24 at 17:00 to 19:46    EEG TECHNICAL DESCRIPTION   Conditions of Recording:  Continuous EEG with simultaneous video recording was performed utilizing 21 active electrodes placed according to the international 10-20 system.  The study was recorded digitally with a bandpass of 1-70Hz  and a sampling rate of 200Hz  and was reviewed with the possibility of multiple reformatting.  The study was digitally processed with potential spike and seizure events identified for physician analysis and review.  Patient recognized events were identified by a push button marker and reviewed by the physician. Simultaneous video was reviewed for all patient events.    DAY 1a and 1b EEG DESCRIPTION: 04/25/24 at 13:18 to 04/25/24 at 17:00 Emily Marker, MD) and 04/25/24, 17:00-19:46 Lala Romano)   Relevant Medications: lacosamide  (Vimpat ) and levetiracetam  (Keppra )    Background:  No clear posterior dominant rhythm is observed. Background consists of diffsue delta activity with overriding faster frequencies, reactive and variable. States changes are seen but no N2 sleep structures observed.      Interictal Epileptiform Activity:  There are abundant BiLPDs arising independently from bilateral posterior head regions, maximal P8/O2 and O1, persistent throughout this recording    Ictal Activity:  There were no ictal patterns noted.    Events:  There were no indications for events of clinical concern.      EEG SUMMARY INTERPRETATION:   This is an abnormal EEG due to:  - Abundant BiLPDs arising independently from bilateral posterior head regions  - Continuous diffuse background slow    CLINICAL CORRELATION / SUMMARY of RECORDING:   These findings are consistent with a moderate encephalopathy. The bilateral independent lateralized periodic discharges (BiLPDs) seen over left and right posterior head regions can be seen in association with focal structural lesions and are also highly associated with seizures although no seizures have been seen on this recording         Interpreting Provider: Payton Marker, MD and Argentina Romano, MD    [id number] Seizure Risk Score  Clinical risk score based on EEG findings and clinical history of seizures to aid in determination of optimal duration of EEG monitoring for detection of electrographic seizures.  Score has not been validated in patients under age 69 or following cardiac arrest.    Lateralized / Bilateral Independent Periodic Discharges or Lateralized Rhythmic Delta Activity - 1 point     Add 1 point if known history of epilepsy or prior clinical seizure.      Risk Group     Seizure Risk   at 72 hours   Duration of Monitoring    Seizure risk < 5%     Duration of Monitoring    Seizure risk < 2%       Low Risk,   Score = 0  3.1%   1 Hour   3.3 Hours     Medium Risk,   Score = 1     12%   12 Hours   29 Hours     High Risk,   Score = 2 or greater                >25%   >24 Hours   >30 Hours   Struck et al JAMA Neurology, January 2020    Not appropriate for EEG monitoring being performed for the following:  Treatment of status epilepticus or seizures already documented on EEG  Monitoring sedation/ burst suppression for management of intracranial pressure and/or paralyzed patients  Diagnostic evaluation of transient episodes concerning for possible seizures (spell capture)   Patients s/p cardiac arrest undergoing targeted temperature management      Amon dunker al. American Clinical Neurophysiology Society's Standardized Critical Care EEG Terminology: 2021 Version. Journal of Clinical Neurophysiology 38(1):p 1-29, January 2021.

## 2024-04-25 NOTE — Consults (Signed)
 Additional order received and the patient is already on the caseload. Continue with plan of care.

## 2024-04-25 NOTE — Consults (Signed)
 Palliative Care Progress Note        Consultation from Requesting Attending Physician:  Pauline Lani Pane, MD  Primary Care Provider:  Alyse Slater Pao, MD      Assessment/Plan:      SUMMARY:  This 78 y.o. 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      Symptom Assessment and Recommendations:    Per primary team.    Goals of care / ACP:  Code Status:   Code Status: Full Code   Healthcare decision-maker if lacks capacity:   HCDM (patient stated preference): Tomson,[name] - Spouse - [phone]    HCDM (patient stated preference): Patricelli,[name] - Daughter - [phone]     11/24: Introduced myself to patient's daughter [name]. She plans to come visit today with her own daughter. Offered that I would be providing support this week. She denied any new concerns or needs from palliative care today.     Goals of care as discussed on 11/18:  - [name] shares this has been a tough week as her mom has gotten sicker, and her dad has made it out of rehab and is living with her, requiring a lot of care and supervision.  She is continuing to work as well and care for her children.  - She has heard that her mom's mental status has worsened and she has not been waking up.  She has heard there is concern about her ammonia levels, and potential seizures.  - She is worried, and at the same time holding out hope still that her mom could improve from this  - She is amenable to a family meeting with PC and ICU teams, although unsure of timing due to her work schedule and other responsibilities    Goals of care as discussed on 11/12:  - Please see ACP note from same day for further details.  - In brief, presented family with options to proceed with tracheostomy versus extubation without a plan for reintubation if she were to fail (plan at that time would be to transition to comfort focused care and allow natural death)  - Family affirmed [name]'s ultimate goals would be to eventually get back home and lead a functional life.  They believe she would be willing to go through a prolonged hospitalization, tracheostomy and rehab if there is some hope of reaching that goal.  ICU team shared that they thought while the road ahead would be long and difficult, but this was definitely possible and that recent indicators have showed that [name] is slowly improving.  - Patient and family ultimately decided to proceed with tracheostomy    Goals of care as discussed on 11/10:  - [name]'s family shares this has been a long and difficult hospitalization, with lots of unexpected decompensations  - They share that before this hospitalization, [name] was doing well at home.  She was independent.  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  This is a MAJOR change for her.  - The ICU team shares that in the past, there have been questions from the family about if they would offer extubation and reintubation if indicated for a 5th time (as an alternative to proceeding straight to  tracheostomy).  They would likely not offer this due to substantial risks without much benefit.    Practical, Emotional, Spiritual Support Recommendations:  Patient's daughters would like the patient to be included in decision making as much as possible    Recommendations discussed with primary team via Epic chat    Thank you for this consult. Please contact Elyn Favorite MD or page Palliative Care if there are any questions.  Palliative Care team will continue to follow.    Subjective:     Recent Events:  Pt remains intubated. Seen by neurology yesterday who recommended repeat EEG and continuing with keppra  and vimpat . I spoke to patient's daughter [name] who plans to visit today with her own daughter. She is maintaining a very positive and hopeful attitude. She was reassured by an update she received by the RN that [name] had an uneventful day yesterday.       Objective:       Function:  30% - Ambulation: Totally Bed Bound / Unable to do any work, extensive disease / Self-Care: Total care / Intake: Reduced / Level of Conscious: Full, drowsy, or confusion    Temp:  [36.4 ??C (97.6 ??F)-37 ??C (98.6 ??F)] 36.4 ??C (97.6 ??F)  Pulse:  [80-102] 82  SpO2 Pulse:  [74-104] 93  Resp:  [12-41] 22  BP: (103-147)/(33-121) 119/54  FiO2 (%):  [30 %] 30 %  SpO2:  [95 %-100 %] 100 %    Physical Exam:  General:  Ill appearing woman in NAD, non responsive to verbal/light tactile stimulation   HEENT:  s/p trach  Eyes:  open, no tracking   Cardiovascular:  Tachycardic rate, regular rhythm, systolic murmur  Pulmonary:  Rhonchorous breath sounds on ventilator  Gastrointestinal:  Distended  Ext:  Worsening pitting bilateral LEE, also present in UE    Testing reviewed and interpreted:  Reviewed and interpreted test results for cbc with anemia; bmp with nl renal function, low albumin  affecting assessment of underlying illness severity and prognosis    I personally spent 35 minutes face-to-face and non-face-to-face in the care of this patient, which includes all pre, intra, and post visit time on the date of service.  All documented time was specific to the E/M visit and does not include any procedures that may have been performed.     See ACP Note from today for additional billable service: No.    Elyn Favorite, MD  Hospice/Palliative Care

## 2024-04-25 NOTE — Progress Notes (Signed)
 Adult Nutrition Progress Note    Visit Type: Follow-Up  Reason for Visit: Enteral Nutrition    NUTRITION INTERVENTIONS and RECOMMENDATION     Tube feeds:   Recommend Nutren 2.0 at goal rate 35 mL/hr.   This provides 1470 kcals, 62 g protein, 159 g carbohydrate, 68 g fat, 0 g fiber, 508 mL free water , and meets 98% USRDI.  Prosource 2x BID --> 240 kcal, 60 g protein   Total nutrition: 1710 kcal, 122 g protein   FWF: continue 200 mL q2hr to account for goal increased output through FMS   Carries severe malnutrition dx - needing ongoing K/phos repletion   Weekly weights  Micronutrients:   Low:   Vit D (13.7) - 50,000 units weekly x8 doses   Copper  (68) - IV 2mg  daily x5 doses (finishes 11/25) - recheck 11/26   Zinc  (36) - 50 mg elemental zinc  daily x 14 doses   WNL/high: folate, B12, niacin   Continue multivitamin, thiamine  100 mg      NUTRITION ASSESSMENT     Patient appropriate for enteral nutrition support given mechanical ventilation.   Lactulose  being titrated for goal of 1500 mL output per day - FMS in place  Increased FWF given goal to increase output     NUTRITIONALLY RELEVANT DATA     HPI & PMH:   [name] is a 78 y.o. female who is presenting to [facility] with Bacteremia, escherichia coli, in the setting of the following pertinent/contributing co-morbidities:  HFpEF, HTN, Cirrhosis, Breast Cancer s/p Partial Mastectomy/Radiation.     Nutrition Progress:   Tolerating tube feeds at goal. Multiple micronutrient deficiencies identified. FMS with ongoing higher outputs to decrease NH4. Ongoing GOC.   High FWF to account for higher FMS outputs.     Medications:  Nutritionally pertinent medications reviewed and evaluated for potential food and/or medication interactions.   Nexium , pepcid , folvite , multivitamin, multivitamin, rifaximin , thiamine  100 mg      Labs:   Nutritionally pertinent labs reviewed.   Cl (110), phos (2.9) GFR cystatin C 35     Nutritional Needs:   Daily Estimated Nutrient Needs:  Energy: 1650-1925 kcals 30-35 kcal/kg using ideal body weight, 55 kg (04/18/24 1505)]  Protein: >109 gm [> 2 gm/kg (per ASPEN/SCCM guidelines for the critically ill obese, BMI 30-39.9) using ideal body weight, 55 kg (04/18/24 1505)]  Carbohydrate:   [45-60% of kcal]  Fluid:   mL [per MD team]    Compared to Adjusted PSU Equation for Ventillated Patients (BMI >/=30, Age >/= 60)  Tmax: 36.9 , Ve: 6.25  Energy: 1568 kcals/day (11/24)    Anthropometric Data:  Height: 162.6 cm (5' 4.02)   Admission weight: 87 kg (191 lb 12.8 oz)  Last recorded weight: (!) 110.4 kg (243 lb 6.2 oz) (04/24/24)  IBW: 54.53 kg  BMI: Body mass index is 41.76 kg/m??.   Usual Body Weight: Unable to obtain at this time   Weight Assessment: per below - trending down pta, though pt with cirrhosis & currently unknown dry wt. Increase over admit likely influenced by fluid - pt noted with 3+ pitting edema bilateral upper and lower extremities     Wt Readings from Last 10 Encounters:   04/24/24 (!) 110.4 kg (243 lb 6.2 oz)   03/07/24 89.3 kg (196 lb 12.8 oz)   03/03/24 89.1 kg (196 lb 6.4 oz)   04/23/23 96.6 kg (213 lb)   03/05/23 100.2 kg (220 lb 14.4 oz)   11/27/22 ROLLEN)  102.5 kg (225 lb 14.4 oz)   10/20/22 (!) 108 kg (238 lb)   07/17/22 (!) 108 kg (238 lb)   06/09/22 (!) 103.2 kg (227 lb 8 oz)   05/08/22 (!) 105.8 kg (233 lb 3.2 oz)     Malnutrition Assessment:  Malnutrition Assessment using AND/ASPEN or GLIM Clinical Characteristics:  Patient does not meet AND/ASPEN criteria for malnutrition at this time (03/16/24 1326)       Nutrition Focused Physical Exam:      Nutrition Evaluation  Overall Impressions: Nutrition-Focused Physical Exam not indicated due to lack of malnutrition risk factors. (03/16/24 1326)     Care plan:  Patient does not meet malnutrition criteria     Current Nutrition:  NPO with NG tube in place   Nutrition Orders            Nutren 2.0 continuous tube feed starting at 11/17 1600    Separate Enteral Additives ProSource NoCarb (Protein Modular/Sugarfree); Quantity: 2 pkts BID starting at 11/04 2000          Nutritionally Pertinent Allergies, Intolerances, Sensitivities, and/or Cultural/Religious Restrictions:  none identified at this time     GOALS and EVALUATION     Patient to meet 80% or greater of nutritional needs via enteral nutrition while remains on tube feeds. - Meeting/Ongoing    Motivation, Barriers, and Compliance:  Evaluation of motivation, barriers, and compliance pending at this time due to clinical status.     Discharge Planning:   Monitor for potential discharge needs with multi-disciplinary team.          Follow-Up Parameters:   1-2 times per week (and more frequent as indicated)      Hulda La MPH, RD, CNSC, LDN   Pager: [phone]  Phone: [phone]  [email or web address]

## 2024-04-25 NOTE — Progress Notes (Signed)
 MICU Daily Progress Note     Date of Service: 04/25/2024    Problem List:   Principal Problem:    Bacteremia, escherichia coli  Active Problems:    Alcoholic liver disease (HHS-HCC)    HTN (hypertension)    Depression with anxiety    Class 2 severe obesity with serious comorbidity in adult    Atrial fibrillation    (CMS-HCC)    Pleural effusion    Hypernatremia    Thrombocytopenia    Cirrhosis    (CMS-HCC)    A-fib (CMS-HCC)    Hepatic encephalopathy    (CMS-HCC)    Anaphylaxis      Interval history: [name] is a 78 y.o. female with HFpEF, HTN, Afib on AC, MASLD cirrhosis, originally hospitalized for scheduled TEE/DCCV (aborted d/t LAA clot) c/b AHHRF, encephalopathy and septic shock requiring MICU care, was s/p extubation transferred to [facility] for further management of anticoagulation and hypernatremia. Transferred back to [facility] MICU for closer monitoring for respiratory status, pressor requirement, GI bleed, and management of carotid artery injury.     On 10/24, patient had acute worsening of respiratory status leading requiring intubation. When patient was given sedation for intubation, she coded and promptly had ROSC after a few minutes of CPR. Subsequently, CVC placed in neck, used to draw blood, unclear if given pressors through it, then found  to be in arterial system based on blood gas and imaging, likely right carotid. Line was subsequently removed, and pressure was held for 15 minutes, hemostasis with no hematoma. She was transferred to [facility] MICU for management of this carotid injury, to be evaluated by vascular surgery while also managing her increased vasopressor requirement and respiratory distress.  Has been weaned off pressors since 10/27 at 1 PM. VBG's indicating that she is ready for extubation, however her mentation suggests that she is not able to protect her airway.  She was extubated on 10/31, and mentation slowly improved.  Unfortunately on the morning of 11/2 she had increased lethargy, increased secretions, and she was not protecting her airway.  She was reintubated on 11/2, and required pressor support with norepinephrine  and vasopressin  after the induction agents.  Self extubated on 11/5. Re intubated on 11/6 due to hypercarbia and increased work of breathing.  We are left with the challenging options regarding the care of [name].  Due to her multiple failed extubations, we are concerned [name] will need a tracheostomy long-term to protect her airway.  There is a meeting with palliative care and family on 04/13/2024, her family agreed for tracheostomy.  Tracheostomy placed 11/14.  On 11/16, patient started presenting with altered mental status as she became hard to arouse and stopped following commands.  Sepsis workup was initiated.  She also had a lateral gaze on exam, so neurology was consulted.  EEG showed seizure activity and MRI brain showed diffuse cortical restricted diffusion greatest within the bilateral parietal -occipital regions.  Her ammonia level is also elevated to 291.  It is unclear if seizures are related to her ammonia level.  Patient placed on Keppra  and Vimpat .    Plans for family meeting next week (12/1) to discuss goals of care.  Palliative is involved.    24 hour events:   - more awake/interactive   - EEG   - dc heparin  --> resume home Eliquis  5 BID     Neurological     Hepatic encephalopathy - Non-Convulsive Seizures   Rapid response called on 10/24 at [facility] for somnolence. Ddx  hepatic encephalopathy, toxic metabolic encephalopathy in the setting of shock (possibly sepsis), intracranial pathology. Intubated.  No acute abnormality on 10/24 head CT. Failed extubation attempts and tracheostomy placed on 11/14. Overnight on 04/18/2024, patient noted to have eye deviation along with decreased responsiveness on physical exam.   Head CT reassuring.  MRI brain with diffuse cortical restricted diffusion greatest within the bilateral parieto-occipital regions, unclear etiology. 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 Neurology consulted.  EEG with evidence of seizure activity, therefore started on Keppra  and vimpat . Ammonia significantly elevated. Clinical status changed in the setting of missed lactulose  doses (due to copious stool output) - suspect possible hyperammonemia leading to seizures. Some improvement of mental status with resuming lactulose  and adequate stool output.   - Neurology following, appreciate recommendations  - Plan to obtain MRI brain in 2 weeks (~12/4)   - Lactulose  30g q4h  - Rifaximin  550 mg twice daily   - Continue Keppra  750 mg twice daily (renally dosed)  - Continue Vimpat  100 mg twice daily  - Continue to monitor mental status  - Goals of care meeting hopefully next week with family, palliative on board   -opens eyes to stimulation, does not follow commands, minimally moves upper extremities  - Back on EEG per neurology    Analgesia: No pain issues  RASS at goal? N/A, not on sedation  [place] Agitation Assessment Scale (RASS) : -1 (04/25/2024  4:00 PM)      Pulmonary       Acute Hypoxic Hypercarbic Respiratory Failure   Rapid response called on 10/24 for increased somnolence, found to have acute hypercarbic respiratory failure and subsequently intubated. Suspect hypercarbic respiratory failure due to encephalopathy as above. CXR on 10/24 showed worsening pleural effusions. Given patient's hypoxia, and AMS, there was concern for infectious etiology.  Infectious workup overall negative. Unable to protect her airway due to her current mentation.  Reintubated on 11/2 followed by therapeutic bronchoscopy.  Self extubated on 11/5.  Reintubate noted on 11/6 due to hypercarbia and not protecting her airway. Trach placed 11/14, complicated by anaphylactic reaction to rocuronium  which was treated with epi, benadryl  and steroids. Allergy added to chart.  First trach change completed on 04/20/2024 by ENT.  - Continue mechanical ventilation, weaning as tolerated  - Trial of high flow trach collar as tolerated  -  VBG q12  -gases stable this AM  - Has not tolerated high flow trach collar, becomes tachypneic, will be placed on set rate overnight, pressure support in the morning  Vent Mode: PSV-CPAP  FiO2 (%): 30 %  S RR: 15  S VT: 330 mL  PEEP: 5 cm H20  PR SUP: 12 cm H20    Cardiovascular   Carotid Artery Injury (resolved)  At [facility], CVC placed intended for RIJ on 10/24 following intubation, PEA, ROSC. Was used with known blood return. Unclear if it was used for pressor support, but highly likely that it was. CXR for placement check showed concern for intra aterial location, and blood gas confirmed it was arterial. Thought to be in carotid artery. CVC removed with pressure held for 15 minutes, with no hematoma formation. On arrival, no physical exam and bedside ultrasound showed no large hematoma. No active bleeding.  Vascular Surgery consulted, stated fistula has healed based on Ultrasound findings.  - CTM    PEA arrest s/p ROSC  Hypovolemic/Hemorrhagic shock (resolved)  Atrial fibrillation  known left atrial appendage clot  HFpEF  Patient with  PEA arrest peri-intubation with ROSC. Shock thought to be hemorrhagic in the setting of GI bleed History of atrial fibrillation with known LAA clot and HFpEF. Vasopressors were weaned off 10/27. APTT was elevated the morning of 10/28 necessitating changing anticoagulation from therapeutic heparin  to therapeutic Lovenox  .   -Metoprolol  25 mg twice daily      Renal   Abnormal Electrolytes - Stable Hypernatremia  At [facility], had persistently hypernatremia near 153. Had been treated with D5 gtt. Sodium throughout admission has largely been 146-148. Sodium has overall normalized and continues to be within normal limits.  - Strict I/O's  - BMP daily  - Magnesium  and potassium repleted this morning  - Potassium replenished this morning  - Good urine output  - Hypernatremia has resolved      Infectious Disease/Autoimmune   E. Coli Bacteremia (resolved),   Initially admitted for afib, found to have E. coli bacteremia on October 10. This was treated with cefazolin . Rapid response called 10/24, patient noted to be hypothermic. WBCs jumped from 3.0 to 9.7 on 10/24. Although the most recent lab was following intubation and CPR. UA with pyuria, rare bacteria, hematuria. Peri-intubation, patient developed significant hypotension requiring initiation of NE and vasopressin . Suspect possible septic shock with unknown infectious source. Patient started on vancomycin  and cefepime  10/24. CT Abdomen pelvis concerning for aspiration pneumonia. Following infectious workup was unremarkable. Antibiotics were stopped on 10/27.  Infectious workup was repeated on 11/3 due to reintubation and status post bronchoscopy.  Completed course of ceftriaxone .  -CTM    Cultures:  Blood Culture, Routine (no units)   Date Value   04/17/2024 No Growth at 5 days   04/17/2024 No Growth at 5 days     Urine Culture, Comprehensive (no units)   Date Value   04/17/2024 NO GROWTH   04/09/2024 NO GROWTH     Lower Respiratory Culture (no units)   Date Value   04/10/2024 2+ Methicillin-Susceptible Staphylococcus aureus (A)   04/10/2024 1+ Oropharyngeal Flora Isolated     WBC (10*9/L)   Date Value   04/25/2024 5.1     WBC, UA (/HPF)   Date Value   04/17/2024 1       FEN/GI   Sigmoid Mass c/f Malignancy and Hematochezia (resolved)  Hemorrhagic Shock (resolved)  CT abdomen pelvis done 10/24 with evidence of cirrhosis and masslike thickening of the lower sigmoid colon concerning for malignancy.  CTA abdomen pelvis performed 1024 with active extravasation into the gastric fundus possibly secondary to traumatic enteric tube placement.  GI scoped the patient on 10/25 and clipped an area of active bleeding.  They removed a total of 1.5 L of blood.  Hemoglobin 10/20 6 AM downtrended to 6.0, so 2 units of blood transfused.  GI stated this was consistent with 10/25 scope and likely did not represent increased bleeding. Hb steadily improving with Hb of 9.2 on 10/28.   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 Given shock of unclear etiology. CTAP demonstrating cirrhosis. Likely that cirrhosis was contributing to hypotension.  Worsening altered mental status with seizure-like activity in the setting of hyperammonemia after missing 2-3 lactulose  doses due to copious stool output.  Evidence of seizure activity on EEG.  Mental status with slow improvement once lactulose  was resumed.  -Continue lactulose   30 g every 4 hours  - Goal stool output 2-4 L daily  - Daily MELD labs and CMP    Vitamin Deficiencies  Low vitamin D , copper , and zinc .  -Appreciate dietitian recommendations  - Continue tube feeds (currently at goal)  - Zinc  repletion  - Multivitamin 1 tablet daily  - Folic acid  1 mg daily  - Copper  2 mg IV  - Vitamin B12 1000 mcg daily     MELD 3.0: 15 at 04/05/2024  4:31 AM  MELD-Na: 13 at 04/05/2024  4:31 AM  Calculated from:  Serum Creatinine: 0.43 mg/dL (Using min of 1 mg/dL) at 88/09/7972  5:68 AM  Serum Sodium: 143 mmol/L (Using max of 137 mmol/L) at 04/05/2024  4:31 AM  Total Bilirubin: 1.3 mg/dL at 88/09/7972  5:68 AM  Serum Albumin : 2.4 g/dL at 88/09/7972  5:68 AM  INR(ratio): 1.61 at 04/03/2024 10:26 AM  Age at listing (hypothetical): 78 years  Sex: Female at 04/05/2024  4:31 AM    Provider Malnutrition Assessment:  Body mass index is 41.76 kg/m??. BMI Interpretation: >/= 30 and < 40, consistent with obesity, clinically significant requiring additional resources and complicating multiple aspects of patient care.  GLIM criteria:   Pt does not meet criteria  -I have screened this patient for malnutrition and they did NOT meet criteria for malnutrition based on GLIM criteria.  -TF and FWF; Dietician consulted, appreciate assistance      Heme/Coag   Acute blood loss anemia  Hemoglobin down trended to 6.0 on the morning of 10/26 thought to be secondary to acute GI bleed that was scoped and clipped 10/25.  H/H stable.  -CBC daily  - Maintain active T&S    Endocrine   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)   - home anastrozole     Integumentary   NAI   #  - WOCN consulted for high risk skin assessment No. Reason: Not indicated.    Prophylaxis/LDA/Restraints/Consults   ICU Checklist completed: yes (see ICU rounding navigator in Epic)    Patient Lines/Drains/Airways Status       Active Active Lines, Drains, & Airways       Name Placement date Placement time Site Days    Tracheostomy Shiley 6 04/20/24  1430  6  5    NG/OG Tube Feedings 10 Fr. Right nostril 04/01/24  1138  Right nostril  24    External Urinary Device 04/13/24 With Suction 04/13/24  1700  -- 12    PICC Double Lumen 04/06/24 Left Basilic 04/06/24  1558  Basilic  19    Peripheral IV 88/79/74 Right Wrist 04/21/24  0830  Wrist  4                  Patient Lines/Drains/Airways Status       Active Wounds       Name Placement date Placement time Site Days    Wound 03/13/24 Irritant Contact Dermatitis Incontinence Sacrum Mid gluteal cleft MASD? 03/13/24  --  Sacrum  43                  Goals of Care     Code Status:   Orders Placed This Encounter   Procedures    Full Code     Standing Status:   Standing     Number of Occurrences:   1        Public Relations Account Executive Maker:  Ms. Stapleton designated healthcare  decision maker(s) is/are   HCDM (patient stated preference): Buege,[name] - Spouse - [phone]    HCDM (patient stated preference): Elliff,[name] - Daughter - [phone]. See HCDM section of Epic sidebar/storyboard or ACP tab in patient chart for details regarding active HCDMs and patient capacity for decision-making.      Subjective     Intubated.  Withdraws to pain. Opens eyes to stimulation. Unable to reliably follow commands.    Objective     Vitals - past 24 hours  Temp:  [36.4 ??C (97.6 ??F)-37 ??C (98.6 ??F)] 36.8 ??C (98.2 ??F)  Pulse:  [79-102] 85  SpO2 Pulse:  [74-104] 85  Resp:  [13-35] 25  BP: (107-147)/(33-121) 121/60  FiO2 (%):  [30 %] 30 %  SpO2:  [96 %-100 %] 100 % Intake/Output  I/O last 3 completed shifts:  In: 3422.6 [P.O.:120; I.V.:784.6; NG/GT:2190; IV Piggyback:328]  Out: 4475 [Urine:975; Emesis/NG output:200; Stool:3300]     Physical Exam:    General: Intubated.   HEENT: normocephalic, atraumatic.  Trach in place.   CV: pulses palpable, regular borderline tachycardia   Pulm: Rhonchi w transmitted upper airway sounds throughout  GI: soft, NTND, + BS  MSK: Bilateral LE pitting edema with weeping over left lower extremity  Skin: Moist skin breakdown around trach.  Neuro: Withdrawals to painful stimuli.  Not following commands.  Reflexes intact    Continuous Infusions:   Infusions Meds[1]    Scheduled Medications:   Scheduled Medications[2]    PRN medications:  PRN Medications[3]    Data/Imaging Review: Reviewed in Epic and personally interpreted on 04/25/2024. See EMR for detailed results.    Waddell HERO Teghan Philbin, DO  PGY-1, Emergency Medicine               [1] [2]    apixaban   5 mg Enteral tube: gastric BID    cupric chloride  (copper ) 2 mg in sodium chloride  (NS) 0.9 % 250 mL IVPB  2 mg Intravenous Daily    cyanocobalamin  (vitamin B-12)  1,000 mcg Enteral tube: gastric Daily    ergocalciferol   100 mcg Enteral tube: gastric Daily    esomeprazole   40 mg Enteral tube: gastric Daily    famotidine   20 mg Enteral tube: gastric Daily    flu vac 2025 [id number](PF)  0.5 mL Intramuscular During hospitalization    folic acid   1 mg Enteral tube: gastric Daily    lacosamide   100 mg Enteral tube: gastric BID    lactulose   30 g Enteral tube: gastric Q4H    levETIRAcetam   750 mg Enteral tube: gastric BID    melatonin  3 mg Enteral tube: gastric QPM    metoPROLOL  tartrate  25 mg Enteral tube: gastric BID multivitamins (ADULT)  1 tablet Enteral tube: gastric Daily    rifAXIMin   550 mg Enteral tube: gastric BID    sodium chloride   10 mL Intravenous Q8H    sodium chloride   10 mL Intravenous Q8H    thiamine  mononitrate (vit B1)  100 mg Enteral tube: gastric Daily    zinc  acetate  50 mg elem zinc  Enteral tube: gastric Daily   [3] acetaminophen 

## 2024-04-25 NOTE — Plan of Care (Signed)
 Problem: Skin Injury Risk Increased  Goal: Skin Health and Integrity  Outcome: Shift Focus  Intervention: Optimize Skin Protection  Recent Flowsheet Documentation  Taken 04/25/2024 1600 by Riva Redell FALCON, RN  Head of Bed [facility]) Positioning: HOB at 30-45 degrees  Taken 04/25/2024 1200 by Riva Redell FALCON, RN  Head of Bed [facility]) Positioning: HOB at 30-45 degrees  Taken 04/25/2024 0800 by Riva Redell FALCON, RN  Pressure Reduction Techniques:   heels elevated off bed   weight shift assistance provided  Head of Bed (HOB) Positioning: HOB at 30-45 degrees  Pressure Reduction Devices:   heel offloading device utilized   positioning supports utilized   pressure-redistributing mattress utilized  Skin Protection:   adhesive use limited   incontinence pads utilized   skin-to-device areas padded   skin-to-skin areas padded   transparent dressing maintained   tubing/devices free from skin contact     Problem: Adult Inpatient Plan of Care  Goal: Absence of Hospital-Acquired Illness or Injury  Outcome: Shift Focus  Intervention: Identify and Manage Fall Risk  Recent Flowsheet Documentation  Taken 04/25/2024 0800 by Riva Redell FALCON, RN  Safety Interventions:   aspiration precautions   bariatric safety   bed alarm   bleeding precautions   enteral feeding safety   fall reduction program maintained   no IV/BP/blood draw left arm   room near unit station  Intervention: Prevent Skin Injury  Recent Flowsheet Documentation  Taken 04/25/2024 1800 by Riva Redell FALCON, RN  Positioning for Skin: Left  Taken 04/25/2024 1600 by Riva Redell FALCON, RN  Positioning for Skin: Right  Taken 04/25/2024 1400 by Riva Redell FALCON, RN  Positioning for Skin: Left  Taken 04/25/2024 1200 by Riva Redell FALCON, RN  Positioning for Skin: Right  Taken 04/25/2024 1000 by Riva Redell FALCON, RN  Positioning for Skin: Left  Taken 04/25/2024 0800 by Riva Redell FALCON, RN  Positioning for Skin: Right  Device Skin Pressure Protection:   absorbent pad utilized/changed   adhesive use limited   positioning supports utilized   pressure points protected  Skin Protection:   adhesive use limited   incontinence pads utilized   skin-to-device areas padded   skin-to-skin areas padded   transparent dressing maintained   tubing/devices free from skin contact  Intervention: Prevent Infection  Recent Flowsheet Documentation  Taken 04/25/2024 0800 by Riva Redell FALCON, RN  Infection Prevention:   hand hygiene promoted   personal protective equipment utilized   rest/sleep promoted   single patient room provided

## 2024-04-25 NOTE — Plan of Care (Signed)
 Patient has # 6 shiley trach. Trach care completed. Trach gauze changed, trach ties changed, and inner cannula changed. Patient tolerates PS 12/5 during the day with tidal volumes in the low to mid 200s. Suctioned scant tan thick secretions.

## 2024-04-25 NOTE — Plan of Care (Signed)
 Problem: Skin Injury Risk Increased  Goal: Skin Health and Integrity  Outcome: Shift Focus  Intervention: Optimize Skin Protection  Recent Flowsheet Documentation  Taken 04/25/2024 0600 by Harden Buzzard, Wright RAMAN, RN  Pressure Reduction Techniques: frequent weight shift encouraged  Head of Bed [facility]) Positioning: HOB at 30-45 degrees  Taken 04/25/2024 0400 by Harden Buzzard, Wright RAMAN, RN  Pressure Reduction Techniques: frequent weight shift encouraged  Head of Bed [facility] Positioning: HOB at 30-45 degrees  Taken 04/25/2024 0200 by Harden Buzzard, Wright RAMAN, RN  Pressure Reduction Techniques: frequent weight shift encouraged  Head of Bed [facility]) Positioning: HOB at 30-45 degrees  Taken 04/25/2024 0000 by Harden Buzzard Wright RAMAN, RN  Activity Management: in bed  Head of Bed [facility]) Positioning: HOB at 30-45 degrees  Skin Protection: silicone foam dressing in place  Taken 04/24/2024 2200 by Harden Buzzard, Wright RAMAN, RN  Activity Management: back to bed  Pressure Reduction Techniques: frequent weight shift encouraged  Head of Bed (HOB) Positioning: HOB at 20-30 degrees  Skin Protection: silicone foam dressing in place  Taken 04/24/2024 2000 by Harden Buzzard, [place] S, RN  Pressure Reduction Techniques: frequent weight shift encouraged  Head of Bed (HOB) Positioning: HOB at 30-45 degrees     Problem: Adult Inpatient Plan of Care  Goal: Absence of Hospital-Acquired Illness or Injury  Outcome: Shift Focus  Intervention: Identify and Manage Fall Risk  Recent Flowsheet Documentation  Taken 04/25/2024 0600 by Harden Buzzard Wright RAMAN, RN  Safety Interventions: aspiration precautions  Taken 04/25/2024 0400 by Harden Buzzard Wright RAMAN, RN  Safety Interventions: aspiration precautions  Taken 04/25/2024 0200 by Harden Buzzard Wright RAMAN, RN  Safety Interventions: aspiration precautions  Taken 04/25/2024 0000 by Harden Buzzard Wright RAMAN, RN  Safety Interventions: aspiration precautions  Taken 04/24/2024 2200 by Harden Buzzard Wright RAMAN, RN  Safety Interventions: aspiration precautions  Taken 04/24/2024 2000 by Harden Buzzard Wright RAMAN, RN  Safety Interventions: aspiration precautions  Intervention: Prevent Skin Injury  Recent Flowsheet Documentation  Taken 04/25/2024 0600 by Harden Buzzard Wright RAMAN, RN  Positioning for Skin: Left  Taken 04/25/2024 0400 by Harden Buzzard Wright RAMAN, RN  Positioning for Skin: Right  Taken 04/25/2024 0200 by Harden Buzzard Wright RAMAN, RN  Positioning for Skin: Left  Taken 04/25/2024 0000 by Harden Buzzard Wright RAMAN, RN  Positioning for Skin: Left  Device Skin Pressure Protection: absorbent pad utilized/changed  Skin Protection: silicone foam dressing in place  Taken 04/24/2024 2200 by Harden Buzzard, [place] S, RN  Positioning for Skin: Right  Skin Protection: silicone foam dressing in place  Taken 04/24/2024 2000 by Harden Buzzard Wright RAMAN, RN  Positioning for Skin: Left  Intervention: Prevent Infection  Recent Flowsheet Documentation  Taken 04/24/2024 2200 by Harden Buzzard, Wright RAMAN, RN  Infection Prevention: hand hygiene promoted  Goal: Optimal Comfort and Wellbeing  Outcome: Shift Focus  Goal: Readiness for Transition of Care  Outcome: Shift Focus  Goal: Rounds/Family Conference  Outcome: Shift Focus     Problem: Adult Inpatient Plan of Care  Goal: Absence of Hospital-Acquired Illness or Injury  Intervention: Identify and Manage Fall Risk  Recent Flowsheet Documentation  Taken 04/25/2024 0600 by Harden Buzzard Wright RAMAN, RN  Safety Interventions: aspiration precautions  Taken 04/25/2024 0400 by Harden Buzzard Wright RAMAN, RN  Safety Interventions: aspiration precautions  Taken 04/25/2024 0200 by Harden Buzzard Wright RAMAN, RN  Safety Interventions: aspiration precautions  Taken 04/25/2024 0000 by Harden Buzzard Wright RAMAN, RN  Safety Interventions: aspiration precautions  Taken 04/24/2024 2200 by Harden Reagan Wright GORMAN, RN  Safety Interventions: aspiration precautions  Taken 04/24/2024 2000 by Harden Reagan Wright GORMAN, RN  Safety Interventions: aspiration precautions   Shift Summary  Tracheostomy site care was performed twice for oozing secretions, with dressing and inner cannula changes documented.   Frequent repositioning and pressure reduction techniques were implemented, with skin protection and perineal care provided throughout the shift.   Family visited and was updated, and psychosocial needs were documented as 'other.'   Labs were drawn early morning, and potassium chloride  was administered twice in the last hour of the shift.   Patient remained in bed, unable to follow commands or express needs, with no significant changes in comfort or readiness for transition of care.    Skin Health and Integrity: Skin remained weeping and bruised throughout the shift, with frequent repositioning, absorbent pad changes, and silicone foam dressing maintained; Braden score decreased from 17 to 13, and pressure reduction techniques were consistently encouraged. Device-related skin protection and perineal care were provided, and CHG wipes were used early morning.    Absence of Hospital-Acquired Illness or Injury: Aseptic technique and hand hygiene were maintained, and aspiration precautions were consistently applied; tracheostomy site care was performed twice for oozing secretions, and vent supplies were changed. No new hospital-acquired injuries were documented during the shift.    Optimal Comfort and Wellbeing: CPOT scores remained at 0 with relaxed facial expression and muscle tension, and no abnormal pain behaviors were observed; patient was asleep during all restraint checks and unable to express or make self understood.    Readiness for Transition of Care: Unplanned readmission score remained unchanged, and cognition and mood were unable to be assessed throughout the shift; patient remained in bed with no ambulation, and overall psychosocial status had exceptions to WDL.    Rounds/Family Conference: Family was notified and visited during the shift, and other needs were expressed in psychosocial documentation.

## 2024-04-25 NOTE — Consults (Signed)
 Follow-Up Consult Note        Requesting Attending Physician:  Pauline Lani Pane, MD  Service Requesting Consult: Medical ICU (MDI)     Assessment and Plan     [name] is a 78 y.o. female pmhx HFpEF, afib, and MASLD cirrhosis admitted with respiratory failure, hepatic encephalopathy, and septic shock course with course c/b hypernatremia on whom I have been asked by Kunal Kishor Jakharia, MD to consult for c/f seizure.    # Roving eye movements:  # Metabolic encephalopathy   Neurology team consulted for evaluation of possible nonrhythmic roving eye movements lasting several hours in duration, in context of progressively declining mental status over 3-4 days (previously intermittently following commands). Roving eye movements are a sign of diffuse cortical dysfunction with preserved brainstem function, most often seen in metabolic encephalopathy. Similarly presenting symptoms include ping-pong gaze (often associated with metabolic encephalopathy, diffuse cortical dysfunction, or toxic ingestion), and periodic alternating gaze deviation (seen in metabolic encephalopathies. This is consistent with patients long complicated course- initially admitted to OSH with DCCV/TEE which was aborted d/t LAA thrombus, multiple episodes of hypercarbic respiratory failure eventually requiring tracheostomy, PEA arrest 10/24 w/ ROSC after min, shock likely 2/2 GIB, E.coli bacteremia, and multiple episodes of hyperammonemia (31 on 9/30, 251 10/10--> 82 10/13-> 160 10/24-> 291 11/16). While patient does have seizures as noted below, these are likely separate from eye movements.    # Diffuse Cortical Restriction  MRI w/ diffuse cortical restriction of the bilateral parieto-occipital regions concerning for prolonged seizure vs PRES vs hypoxic ischemic encephalopathy vs hyperammonemia- All new since 11/2 MRI. Clinical course not consistent with CJD. Imaging discussed at neuroradiology conference - wide diffuse pattern is most suggestive of severe toxic-metabolic etiology (hyperammonemia and/or PRES (severe ammonia swings, renal injury). Pattern does not localize to vascular territory and patient is afebrile with no leukocytosis- decreased concern for meningitis/encephalitis.  Patient did have repeated episodes of AMS since MRI 11/2 and seizures were subclinical- possible she was having seizures previously, however is not favored to be primary etiology at this time.     # Non-convulsive Seizures  cvEEG w/ LPD+F c/f IIC with possible ictal features.  Transient improvement with LEV load (transitioned to burst attenuation pattern with persistent bilateral posterior BIRDs). LCS load was added again with transient improvement to bilateral posterior LPDs 1 hz. Exam next day improved- opening eyes to voice, some regarding, semi-localizing to noxious stimulus in the BUE, and weak withdrawal to noxious in the BLE- remained stable since on Vimpat  100mg  BID and Keppra  750mg  BID, despite no sedation and some improvement of toxic-metabolic abnormalities    Repeat cvEEG obtained 04/25/2024, re-demonstrating bilateral LPDs of the bilateral posterior regions, without any identifiable electrographic seizures.    # Diffuse hyperreflexia:   Hyperreflexia in BUE with + hoffman's and pectoralis reflexes, jaw jerk absent. Bilateral clonus (4+ beats) also noted. No recent serotonergic medications nor fever. 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, moderate canal stenosis at C4-C5, moderate/severe canal stenosis at C6-C7, severe neuroforaminal stenosis at bilateral C5-C6 and left C6-C7- abnormal cord signal likely contributing to exam with hyperammonemia contributing    Recommendations:  Continue Keppra  1g BID and Vimpat  100 mg BID  (renally dose medications per pharmacy),  Discontinue cvEEG given improvement from prior  neurosurgery consult for evaluation of  cord signal change on MRI per primary team  continued lactulose /BM per primary team   Consider repeat MRI Brain in 2 weeks (~12/4) to assess for progression/resolution of previously noted cortical restricted diffusion    We will sign off. Please feel free to reach out with any questions or concerns.    This patient was discussed with Dr. Christiane, who agrees with the above assessment and plan.    Recommendations discussed with primary team.     Levander Severin, MD  Resident Physician PGY-4  Department of Neurology  [facility], [place], [place]     I was immediately available via phone/pager or present on site. I reviewed and discussed the case with the resident, but did not see the patient. I agree with the assessment and plan as documented in the resident's note.     Morna Christiane, MD      HPI        Reason for Consult: c/f seizure    [name] is a 78 y.o. female on whom I have been asked by Kunal Kishor Jakharia, MD to consult for c/f seizure.    Interval History 11/24:  - Exam stable from prior  - cvEEG re-hooked 13:18. Discussed with Epilepsy faculty regarding captured data up to 19:14, demonstrating bilateral LPDs without electrographic seizure. EEG discontinued.    Interval History 11/23:  - Patient has been stable, but no improvements in exam since 11/20.     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 Also demonstrated moderate canal stenosis at C4-C5, moderate/severe canal stenosis at C6-C7, as well as severe neuroforaminal stenosis at bilateral C5-C6 and left C6-C7.   - Exam today improved from prior, patient with midline gaze, somewhat tracking/regarding, not following commands, but grimaces and semi-localizes to noxious in BUE, and weak w/d to nox in BLE.     Interval History 11/18:   - Overnight was found to have EEG findings c/w IIC (LPDs+F), prompting initiation of LEV+LCS with some transient improvement  - MRI Brain w/wo was obtained, demosntrating diffuse bilateral parieto-occipital restricted diffusion. Case discussed on Neuroradiology case conference, felt to be most compatible with toxic-metabolic primary etiology.   - Re-reviewed overall history given complicated course, as detailed more extensively in A&P  - Exam now with midline gaze and intact OCRs, overall improved from prior    Initial HPI:  78 yo PMHx HFpEF, afib, and MASLD cirrhosis (MELD 15) admitted for respiratory failure, encephalopathy, and septic shock course with c/b hypernatremia.     At around 1930 on 11/17 was noted to have roving horizontal eye movements. Movements were nonrhythmic but persisted for several hours raising primary teams concerns for possible seizure. They gave 4mg  versed  and per their report movements only slowed down for a brief duration.     No reported facial twitching, limb shaking, fixed lateralized gaze, urinary incontinence, or tongue injury. Per chart review, no past history of seizures.     Per nursing report, patient has gradually become less and less responsive over the last 3 days. She was following commands and moving extremities (squeeze hands, wiggle toes) on 11/14, but now is unresponsive and has no spontaneous movement.     Lactulose  was held for intermittent periods starting 11/12 and was discontinued on 11/15 due to increased bowel movement frequency (  per chart review had 5 BMs on 11/15). Since worsened mentation on 11/16,  lactulose  was resumed.   Ammonia 291 on 11/16, previously 160 on 10/24    Earlier in ICU stay had E. Coli bacteremia -Finished course of ceftriaxone .     On a CTA abdomen in October 2025 was noted to have a chronic T12 compression fracture.   On a CT cervical spine in May 2024 was noted to have questionable lucency involving the left lateral aspect of C2 vertebral body.     Has known history of osteoporosis identified on Dexa scan of femoral neck (11/2022 T score -2.9).     Allergies[1]     Current Medications[2] Prescriptions Prior to Admission[3]    Past Medical History[4]    Past Surgical History[5]    Social History[6]    Family History[7]    Code Status: Full Code     Review of Systems     Unable to obtain due to patient's mental status.       Objective        Temp:  [36.4 ??C (97.6 ??F)-37.2 ??C (99 ??F)] 36.4 ??C (97.6 ??F)  Pulse:  [80-102] 94  SpO2 Pulse:  [74-104] 93  Resp:  [12-41] 22  BP: (103-147)/(33-121) 119/54  MAP (mmHg):  [64-130] 78  FiO2 (%):  [30 %] 30 %  SpO2:  [95 %-100 %] 100 %  No intake/output data recorded.      Physical Exam:  General Exam:  General: Lying in bed. No obvious distress.   ENT:  Mucous membranes moist. Oropharynx clear.  Cardiovascular:  Regular rate and rhythm.   Respiratory: trach in place.  Gastrointestinal: soft  Extremities: Edema in upper extremities bilateral.   Edema in lower extremities bilateral.    Skin: No obvious rashes or ecchymoses.    Neurological Exam:  Mental Status  LOC: arouses to voice  Orientation: UTA  Speech: UTA  Briefly tracks/regards  Does not follow commands.     Previously: Unable to arouse    Cranial Nerves  Pupils: PERRL 5mm -> 4mm  Corneals: present  Gaze: Midline gaze, some left gave preference. Previously: slow roving, nonrhythmic, horizontal eye movements   Face Motor: normal and symmetric  Oculocephalic: present  Cough: strong  Gag: strong    Motor:   RUE: Semi-localizes (reaches towards source of noxious) Previously: no response  LUE: Semi-localizes (reaches towards source of nox, crosses midline) Previously:  no response  RLE: Weak withdrawal to nox. Previously: brisk toe and foot extension to light touch over the dorsum of the foot and triple flexion to noxious  LLE: Weak withdrawal to nox. Previously:  toe and foot extension to light touch over the dorsum of the foot and triple flexion to noxious     Sensory:  response to light touch and noxious as above    Reflexes:    Reflexes Right Left   Biceps  C5 3+ 3+   Brachioradialis C6  3+ 3+   Triceps C7 3+ 3+   Patella L3 (4) N/a N/a   Achilles S1 (S2) >4+ beats clonus >4+ beats clonus   Plantar Response Extensor Extensor   Jaw Jerk CN V Absent   Pectoralis C5-T1 Present Present   Hoffman Present Absent   Cross Adductors L2-4 Absent Absent     Glasgow Coma Score  E3VTM5  Previously: E1VTM3     Diagnostic Studies      All Labs Last 24hrs:   Recent Results (from the past 24  hours)   aPTT    Collection Time: 04/24/24 12:08 PM   Result Value Ref Range    APTT 74.8 (H) 24.8 - 38.4 sec    Heparin  Correlation 0.4    Blood Gas, Venous    Collection Time: 04/24/24  5:55 PM   Result Value Ref Range    Specimen Source Venous     FIO2 Venous Not Specified     pH, Venous 7.40 7.32 - 7.43    pCO2, Ven 42 40 - 60 mm Hg    pO2, Ven 39 30 - 55 mm Hg    HCO3, Ven 25 22 - 27 mmol/L    Base Excess, Ven 0.7 -2.0 - 2.0    O2 Saturation, Venous 60.8 40.0 - 85.0 %    Carboxyhemoglobin, Venous 1.7 (H) <1.2 %    Methemoglobin, Venous <1.0 <1.5 %    Oxyhemoglobin Venous 59.5 40.0 - 85.0 %   Basic Metabolic Panel    Collection Time: 04/24/24  5:55 PM   Result Value Ref Range    Sodium 144 135 - 145 mmol/L    Potassium 3.8 3.4 - 4.8 mmol/L    Chloride 108 (H) 98 - 107 mmol/L    CO2 25.0 20.0 - 31.0 mmol/L    Anion Gap 11 5 - 14 mmol/L    BUN 16 9 - 23 mg/dL    Creatinine 9.59 (L) 0.55 - 1.02 mg/dL    BUN/Creatinine Ratio 40     eGFR CKD-EPI (2021) Female >90 >=60 mL/min/1.20m2    Glucose 141 70 - 179 mg/dL    Calcium  8.3 (L) 8.7 - 10.4 mg/dL   Magnesium  Level    Collection Time: 04/24/24  5:55 PM   Result Value Ref Range    Magnesium  1.9 1.6 - 2.6 mg/dL   Basic Metabolic Panel    Collection Time: 04/25/24  3:30 AM   Result Value Ref Range    Sodium 145 135 - 145 mmol/L    Potassium 3.7 3.4 - 4.8 mmol/L    Chloride 110 (H) 98 - 107 mmol/L    CO2 25.0 20.0 - 31.0 mmol/L    Anion Gap 10 5 - 14 mmol/L    BUN 17 9 - 23 mg/dL    Creatinine 9.58 (L) 0.55 - 1.02 mg/dL    BUN/Creatinine Ratio 41     eGFR CKD-EPI (2021) Female >90 >=60 mL/min/1.42m2    Glucose 129 70 - 179 mg/dL    Calcium  8.5 (L) 8.7 - 10.4 mg/dL   Hepatic Function Panel    Collection Time: 04/25/24  3:30 AM   Result Value Ref Range    Albumin  2.4 (L) 3.4 - 5.0 g/dL    Total Protein 5.5 (L) 5.7 - 8.2 g/dL    Total Bilirubin 0.7 0.3 - 1.2 mg/dL    Bilirubin, Direct 9.59 (H) 0.00 - 0.30 mg/dL    AST 33 <=65 U/L    ALT 16 10 - 49 U/L    Alkaline Phosphatase 172 (H) 46 - 116 U/L   Magnesium  Level    Collection Time: 04/25/24  3:30 AM   Result Value Ref Range    Magnesium  1.9 1.6 - 2.6 mg/dL   Phosphorus Level    Collection Time: 04/25/24  3:30 AM   Result Value Ref Range    Phosphorus 2.9 2.4 - 5.1 mg/dL   Cystatin C    Collection Time: 04/25/24  3:30 AM   Result Value Ref Range    Cystatin C  1.65 (H) 0.64 - 1.23 mg/L    eGFR CKD-EPI (2012) Cystatin C Female 35 (L) >=60 mL/min/1.68m2   Blood Gas, Venous    Collection Time: 04/25/24  3:30 AM   Result Value Ref Range    Specimen Source Venous     FIO2 Venous Not Specified     pH, Venous 7.38 7.32 - 7.43    pCO2, Ven 42 40 - 60 mm Hg    pO2, Ven 41 30 - 55 mm Hg    HCO3, Ven 25 22 - 27 mmol/L    Base Excess, Ven 0.3 -2.0 - 2.0    O2 Saturation, Venous 68.2 40.0 - 85.0 %    Carboxyhemoglobin, Venous 1.8 (H) <1.2 %    Methemoglobin, Venous <1.0 <1.5 %    Oxyhemoglobin Venous 66.7 40.0 - 85.0 %   APTT    Collection Time: 04/25/24  3:30 AM   Result Value Ref Range    APTT 73.7 (H) 24.8 - 38.4 sec    Heparin  Correlation 0.4    CBC w/ Differential    Collection Time: 04/25/24  3:30 AM   Result Value Ref Range    WBC 5.1 3.6 - 11.2 10*9/L    RBC 2.70 (L) 3.95 - 5.13 10*12/L    HGB 8.5 (L) 11.3 - 14.9 g/dL    HCT 73.6 (L) 65.9 - 44.0 %    MCV 97.1 (H) 77.6 - 95.7 fL    MCH 31.5 25.9 - 32.4 pg    MCHC 32.5 32.0 - 36.0 g/dL    RDW 80.6 (H) 87.7 - 15.2 %    MPV 9.2 6.8 - 10.7 fL    Platelet 170 150 - 450 10*9/L    Anisocytosis Moderate (A) Not Present     CT head 11/17   No acute abnormalities on personal review    MRI Brain W Wo Contrast, 04/03/24  Impression  Multiple sequences are degraded due to motion which limits evaluation.  Within the limitations no acute intracranial abnormality.  Severe periventricular and deep white matter T2/FLAIR hyperintense signal compatible with small vessel ischemic changes. Multiple microhemorrhages as described. Consider cognitive assessment.  Remote left cerebellar infarct.  Small right mastoid effusion.    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      Moderate to severe central canal stenosis at C6-7 and moderate canal stenosis at C4-C5 secondary to disc osteophyte complex and ligament flavum hypertrophy.      Severe neural foraminal stenosis bilaterally at C5-6. Severe left-sided neural foraminal stenosis at C6-7.                               [1]   Allergies  Allergen Reactions    Rocuronium  Anaphylaxis     Suspected intraoperative anaphylaxis 04/15/2024. See anesthetic from tracheostomy. Tryptase pending at time of listing.     Sulfa (Sulfonamide Antibiotics) Anaphylaxis    Sulfur  Anaphylaxis     Tolerated sulfur  colloid injection without incident 09/03/2020.    Aspirin      thrombocytopenia   [2]   Current Facility-Administered Medications   Medication Dose Route Frequency Provider Last Rate Last Admin    acetaminophen  (TYLENOL ) tablet 650 mg  650 mg Oral Q4H PRN Sines, Benjamin Jacob, MD   650 mg at 04/12/24 1214  cupric chloride  (copper ) 2 mg in sodium chloride  (NS) 0.9 % 250 mL IVPB  2 mg Intravenous Daily Onokalah, Chinonyerem A, MD   Stopped at 04/24/24 1206    cyanocobalamin  (vitamin B-12) tablet 1,000 mcg  1,000 mcg Enteral tube: gastric Daily Sines, Benjamin Jacob, MD   1,000 mcg at 04/24/24 9077    ergocalciferol  (DRISDOL ) oral drops 200 mcg/mL (8,000 unit/mL)  100 mcg Enteral tube: gastric Daily Seilheimer, Waddell HERO, DO   100 mcg at 04/24/24 9077    esomeprazole  (NEXIUM ) granules 40 mg  40 mg Enteral tube: gastric Daily Sines, Morene Cadet, MD   40 mg at 04/24/24 9077    famotidine  (PEPCID ) tablet 20 mg  20 mg Enteral tube: gastric Daily Onokalah, Chinonyerem A, MD   20 mg at 04/24/24 9077    flu vacc 2025-26 (65 yr up) (PF)(FLUAD)45 mcg(70mcgx3)/0.5 ml IM syringe  0.5 mL Intramuscular During hospitalization Sines, Benjamin Jacob, MD        folic acid  (FOLVITE ) tablet 1 mg  1 mg Enteral tube: gastric Daily Sines, Benjamin Jacob, MD   1 mg at 04/24/24 9077    heparin  (porcine) 1000 unit/mL injection 2,000 Units  2,000 Units Intravenous Q6H PRN Sines, Benjamin Jacob, MD        heparin  25,000 Units/250 mL (100 units/mL) in 0.45% saline infusion (premade)  0-24 Units/kg/hr Intravenous Continuous Seilheimer, Waddell HERO, DO 10.37 mL/hr at 04/24/24 0940 10 Units/kg/hr at 04/24/24 0940    lacosamide  (VIMPAT ) tablet 100 mg  100 mg Enteral tube: gastric BID Maxwell, Chinonyerem A, MD   100 mg at 04/24/24 2115    lactulose  oral solution  30 g Enteral tube: gastric Q4H Sweeney, [name] A, MD   30 g at 04/25/24 9478    levETIRAcetam  (KEPPRA ) tablet 750 mg  750 mg Enteral tube: gastric BID Onokalah, Chinonyerem A, MD   750 mg at 04/24/24 2107    melatonin tablet 3 mg  3 mg Enteral tube: gastric QPM Onokalah, Chinonyerem A, MD   3 mg at 04/24/24 1807    metoPROLOL  tartrate (Lopressor ) tablet 25 mg  25 mg Enteral tube: gastric BID Seilheimer, Waddell HERO, DO   25 mg at 04/24/24 2107    multivitamins, therapeutic with minerals tablet 1 tablet  1 tablet Enteral tube: gastric Daily Sines, Benjamin Jacob, MD   1 tablet at 04/24/24 9077    potassium chloride  20 mEq in 100 mL IVPB Premix  20 mEq Intravenous Once Ancell, Kaitlyn R, MD        rifAXIMin  (XIFAXAN ) oral suspension  550 mg Enteral tube: gastric BID Sines, Benjamin Jacob, MD   550 mg at 04/24/24 2107    sodium chloride  (NS) 0.9 % flush 10 mL  10 mL Intravenous Q8H Sines, Benjamin Jacob, MD   10 mL at 04/25/24 0300    sodium chloride  (NS) 0.9 % flush 10 mL  10 mL Intravenous Q8H Sines, Benjamin Jacob, MD   10 mL at 04/25/24 0300    thiamine  mononitrate (vit B1) tablet 100 mg  100 mg Enteral tube: gastric Daily Sines, Morene Cadet, MD   100 mg at 04/24/24 9077    zinc  acetate oral solution  50 mg elem zinc  Enteral tube: gastric Daily Onokalah, Chinonyerem A, MD   50 mg elem zinc  at 04/24/24 1807   [3]   Medications Prior to Admission   Medication Sig Dispense Refill Last Dose/Taking    anastrozole  (ARIMIDEX ) 1 mg tablet Take 1 tablet (1 mg total) by mouth daily.  90 tablet 3 03/10/2024 at  6:00 AM    carvedilol  (COREG ) 3.125 MG tablet Take 1 tablet (3.125 mg total) by mouth two (2) times a day. 180 tablet 2 03/10/2024 Morning    folic acid  (FOLVITE ) 1 MG tablet Take 1 tablet (1 mg total) by mouth daily.   03/10/2024 at  6:00 AM    furosemide  (LASIX ) 40 MG tablet Take 1 tablet (40 mg total) by mouth daily as needed. 30 tablet 11 03/10/2024 at  6:00 AM    magnesium  oxide (MAG-OX) 400 mg (241.3 mg elemental magnesium ) tablet Take 1 tablet (400 mg total) by mouth two (2) times a day. 180 tablet 3 03/10/2024 at  6:00 AM    potassium chloride  20 MEQ ER tablet Take 1 tablet (20 mEq total) by mouth two (2) times a day. 60 tablet 11 03/09/2024 at  6:00 AM    spironolactone  (ALDACTONE ) 25 MG tablet Take 1 tablet (25 mg total) by mouth daily. 90 tablet 1 03/10/2024 at  6:00 AM    thiamine  (B-1) 100 MG tablet Take 1 tablet (100 mg total) by mouth daily.   03/10/2024 Morning    vitamin E-268 mg, 400 UNIT, 268 mg (400 UNIT) capsule Take 800 mg by mouth in the morning. 2 tabs.   03/10/2024 Morning    acetaminophen  (TYLENOL ) 325 MG tablet Take 2 tablets (650 mg total) by mouth every six (6) hours as needed for pain.  0     [Paused] alendronate  (FOSAMAX ) 70 MG tablet Take 1 tablet (70 mg total) by mouth every seven (7) days. (Patient not taking: Reported on 03/07/2024) 12 tablet 3     calcium  carbonate (OS-CAL) 1,250 mg (500 mg elem calcium ) tablet Take 1 tablet (500 mg elem calcium  total) by mouth in the morning. (Patient not taking: Reported on 03/07/2024)       cyanocobalamin  1000 MCG tablet Take 1 tablet (1,000 mcg total) by mouth daily.       [EXPIRED] nystatin  (MYCOSTATIN ) 100,000 unit/gram powder Apply to affected area 3 times daily 15 g 0    [4]   Past Medical History:  Diagnosis Date    A-fib (CMS-HCC)     Alcoholism    (CMS-HCC)     Alcoholism /alcohol abuse     Cirrhosis    (CMS-HCC)     Depression     Hypertension     Liver disease     Malignant neoplasm of overlapping sites of right breast in female, estrogen receptor positive    (CMS-HCC) 10/03/2020   [5]   Past Surgical History:  Procedure Laterality Date    BREAST BIOPSY Right     benign-a long time ago    BREAST BIOPSY Right 07/2020    malignant    BREAST LUMPECTOMY Right     4 2022    CENTRAL LINE  03/25/2024    CHOLECYSTECTOMY      PR BX/REMV,LYMPH NODE,DEEP AXILL Right 09/03/2020    Procedure: BX/EXC LYMPH NODE; OPEN, DEEP AXILRY NODE;  Surgeon: Alm Elsie Como, MD;  Location[facility];  Service: Surgical Oncology Breast    PR CARDIOVERSION, ELECTIVE;EXTERN N/A 03/10/2024    Procedure: CARDIOVERSION, ELECTIVE, ELECTRICAL CONVERSION OF ARRHYTHMIA; EXTERNAL;  Surgeon: Sedalia Velma Hamilton, MD;  Location: [facility] OR [facility];  Service: Cardiology    PR ECHO HEART,TRANSESOPHAGEAL,COMPLETE Midline 03/10/2024    Procedure: ECHOCARDIOGRAPHY, TRANSESOPHAGEAL, REAL-TIME WITH IMAGE DOCUMENTATION;  Surgeon: Sedalia Velma Hamilton, MD;  Location[facility] OR [facility];  Service: Cardiology  PR INTRAOPERATIVE SENTINEL LYMPH NODE ID W DYE INJECTION Right 09/03/2020    Procedure: INTRAOPERATIVE IDENTIFICATION SENTINEL LYMPH NODE(S) INCLUDE INJECTION NON-RADIOACTIVE DYE, WHEN PERFORMED; Surgeon: Alm Elsie Como, MD;  Location: [facility] OR [facility];  Service: Surgical Oncology Breast    PR MASTECTOMY, PARTIAL Right 09/03/2020    Procedure: MASTECTOMY, PARTIAL (EG, LUMPECTOMY, TYLECTOMY, QUADRANTECTOMY, SEGMENTECTOMY);  Surgeon: Alm Elsie Como, MD;  Location: [facility] OR [facility]  Service: Surgical Oncology Breast    PR TRACHEOSTOMY, PLANNED N/A 04/15/2024    Procedure: TRACHEOSTOMY PLANNED (SEPART PROC);  Surgeon: Pixie Loader, MD;  Location: OR [facility];  Service: ENT    PR UPPER GI ENDOSCOPY,BIOPSY N/A 02/03/2018    Procedure: UGI ENDOSCOPY; WITH BIOPSY, SINGLE OR MULTIPLE;  Surgeon: Eleanor Dewey Sorrel, MD;  Location: [facility] PROCED[facility];  Service: Gastroenterology    RADIATION Right     unsure finish date    TUBAL LIGATION     [6]   Social History  Socioeconomic History    Marital status: Married     Spouse name: None    Number of children: None    Years of education: None    Highest education level: None   Tobacco Use    Smoking status: Never     Passive exposure: Past    Smokeless tobacco: Never   Vaping Use    Vaping status: Never Used   Substance and Sexual Activity    Alcohol use: Not Currently    Drug use: Never    Sexual activity: Not Currently   Social History Narrative    Daughter fills med boxes weekly.      Social Drivers of Health     Food Insecurity: No Food Insecurity (03/02/2024)    Hunger Vital Sign     Worried About Running Out of Food in the Last Year: Never true     Ran Out of Food in the Last Year: Never true   Tobacco Use: Low Risk (03/30/2024)    Patient History     Smoking Tobacco Use: Never     Smokeless Tobacco Use: Never     Passive Exposure: Past   Transportation Needs: No Transportation Needs (03/02/2024)    PRAPARE - Transportation     Lack of Transportation (Medical): No     Lack of Transportation (Non-Medical): No   Alcohol Use: Not At Risk (04/20/2023)    Alcohol Use     How often do you have a drink containing alcohol?: Never     How many drinks containing alcohol do you have on a typical day when you are drinking?: 1 - 2     How often do you have 5 or more drinks on one occasion?: Never   Housing: Low Risk (03/02/2024)    Housing     Within the past 12 months, have you ever stayed: outside, in a car, in a tent, in an overnight shelter, or temporarily in someone else's home (i.e. couch-surfing)?: No     Are you worried about losing your housing?: No   Physical Activity: Inactive (04/20/2023)    Exercise Vital Sign     Days of Exercise per Week: 0 days     Minutes of Exercise per Session: 0 min   Utilities: Low Risk (04/20/2023)    Utilities     Within the past 12 months, have you been unable to get utilities (heat, electricity) when it was really needed?: No   Stress: No Stress Concern Present (04/20/2023)    [employer] - Occupational Stress Questionnaire     Feeling of Stress : Only a little   Interpersonal Safety: Patient Unable To Answer (03/11/2024)    Interpersonal Safety     Unsafe Where You Currently Live: Patient unable to answer     Physically Hurt by Anyone: Patient unable to answer     Abused by Anyone: Patient unable to answer   Social Connections: Moderately Isolated (04/20/2023)    Social Connection and Isolation Panel     Frequency of Communication with Friends and Family: More than three times a week     Frequency of Social Gatherings with Friends and Family: More than three times a week     Attends Religious Services: Never     Database Administrator or Organizations: No     Attends Engineer, Structural: Never     Marital Status: Married   Programmer, Applications: Low Risk (03/02/2024)    Overall Financial Resource Strain (CARDIA)     Difficulty of Paying Living Expenses: Not hard at all   Health Literacy: Low Risk (04/20/2023)    Health Literacy     : Never   Internet Connectivity: No Internet connectivity concern identified (04/20/2023)    Internet Connectivity     Do you have access to internet services: Yes How do you connect to the internet: Personal Device at home     Is your internet connection strong enough for you to watch video on your device without major problems?: Yes     Do you have enough data to get through the month?: Yes     Does at least one of the devices have a camera that you can use for video chat?: Yes   [7]   Family History  Problem Relation Age of Onset    Heart disease Mother     No Known Problems Father     No Known Problems Sister     No Known Problems Daughter     No Known Problems Maternal Grandmother     No Known Problems Maternal Grandfather     No Known Problems Paternal Grandmother     No Known Problems Paternal Grandfather     Lupus Brother     No Known Problems Other     BRCA 1/2 Neg Hx     Breast cancer Neg Hx     Cancer Neg Hx     Colon cancer Neg Hx     Endometrial cancer Neg Hx     Ovarian cancer Neg Hx     Mental illness Neg Hx     Substance Abuse Disorder Neg Hx

## 2024-04-26 LAB — CBC W/ AUTO DIFF
BASOPHILS ABSOLUTE COUNT: 0 10*9/L (ref 0.0–0.1)
BASOPHILS RELATIVE PERCENT: 0.5 %
EOSINOPHILS ABSOLUTE COUNT: 0.2 10*9/L (ref 0.0–0.5)
EOSINOPHILS RELATIVE PERCENT: 3.2 %
HEMATOCRIT: 28.1 % — ABNORMAL LOW (ref 34.0–44.0)
HEMOGLOBIN: 9.1 g/dL — ABNORMAL LOW (ref 11.3–14.9)
LYMPHOCYTES ABSOLUTE COUNT: 2 10*9/L (ref 1.1–3.6)
LYMPHOCYTES RELATIVE PERCENT: 26.4 %
MEAN CORPUSCULAR HEMOGLOBIN CONC: 32.5 g/dL (ref 32.0–36.0)
MEAN CORPUSCULAR HEMOGLOBIN: 31.5 pg (ref 25.9–32.4)
MEAN CORPUSCULAR VOLUME: 97 fL — ABNORMAL HIGH (ref 77.6–95.7)
MEAN PLATELET VOLUME: 9.3 fL (ref 6.8–10.7)
MONOCYTES ABSOLUTE COUNT: 1 10*9/L — ABNORMAL HIGH (ref 0.3–0.8)
MONOCYTES RELATIVE PERCENT: 13.9 %
NEUTROPHILS ABSOLUTE COUNT: 4.1 10*9/L (ref 1.8–7.8)
NEUTROPHILS RELATIVE PERCENT: 56 %
PLATELET COUNT: 202 10*9/L (ref 150–450)
RED BLOOD CELL COUNT: 2.9 10*12/L — ABNORMAL LOW (ref 3.95–5.13)
RED CELL DISTRIBUTION WIDTH: 19.1 % — ABNORMAL HIGH (ref 12.2–15.2)
WBC ADJUSTED: 7.4 10*9/L (ref 3.6–11.2)

## 2024-04-26 LAB — HEPATIC FUNCTION PANEL
ALBUMIN: 2.5 g/dL — ABNORMAL LOW (ref 3.4–5.0)
ALKALINE PHOSPHATASE: 190 U/L — ABNORMAL HIGH (ref 46–116)
ALT (SGPT): 16 U/L (ref 10–49)
AST (SGOT): 34 U/L (ref <=34)
BILIRUBIN DIRECT: 0.4 mg/dL — ABNORMAL HIGH (ref 0.00–0.30)
BILIRUBIN TOTAL: 0.7 mg/dL (ref 0.3–1.2)
PROTEIN TOTAL: 5.8 g/dL (ref 5.7–8.2)

## 2024-04-26 LAB — CYSTATIN C
CYSTATIN C: 1.64 mg/L — ABNORMAL HIGH (ref 0.64–1.23)
EGFR CKD-EPI (2012) CYSTATIN C FEMALE: 35 mL/min/1.73m2 — ABNORMAL LOW (ref >=60)

## 2024-04-26 LAB — BASIC METABOLIC PANEL
ANION GAP: 10 mmol/L (ref 5–14)
BLOOD UREA NITROGEN: 16 mg/dL (ref 9–23)
BUN / CREAT RATIO: 39
CALCIUM: 8.5 mg/dL — ABNORMAL LOW (ref 8.7–10.4)
CHLORIDE: 107 mmol/L (ref 98–107)
CO2: 25 mmol/L (ref 20.0–31.0)
CREATININE: 0.41 mg/dL — ABNORMAL LOW (ref 0.55–1.02)
EGFR CKD-EPI (2021) FEMALE: >90 mL/min/1.73m2 (ref >=60)
GLUCOSE RANDOM: 125 mg/dL (ref 70–179)
POTASSIUM: 4.4 mmol/L (ref 3.4–4.8)
SODIUM: 142 mmol/L (ref 135–145)

## 2024-04-26 LAB — PHOSPHORUS: PHOSPHORUS: 2.9 mg/dL (ref 2.4–5.1)

## 2024-04-26 LAB — BLOOD GAS, VENOUS
BASE EXCESS VENOUS: -0.1 (ref -2.0–2.0)
BASE EXCESS VENOUS: -1.3 (ref -2.0–2.0)
CARBOXYHEMOGLOBIN, VENOUS: 2.5 % — ABNORMAL HIGH (ref <1.2)
CARBOXYHEMOGLOBIN, VENOUS: 1.7 % — ABNORMAL HIGH (ref <1.2)
HCO3 VENOUS: 26 mmol/L (ref 22–27)
HCO3 VENOUS: 23 mmol/L (ref 22–27)
METHEMOGLOBIN, VENOUS: <1 % (ref <1.5)
METHEMOGLOBIN, VENOUS: <1 % (ref <1.5)
O2 SATURATION VENOUS: 68.4 % (ref 40.0–85.0)
O2 SATURATION VENOUS: 65.8 % (ref 40.0–85.0)
OXYHEMOGLOBIN, VENOUS: 66.1 % (ref 40.0–85.0)
OXYHEMOGLOBIN, VENOUS: 64.4 % (ref 40.0–85.0)
PCO2 VENOUS: 46 mmHg (ref 40–60)
PCO2 VENOUS: 41 mmHg (ref 40–60)
PH VENOUS: 7.36 (ref 7.32–7.43)
PH VENOUS: 7.38 (ref 7.32–7.43)
PO2 VENOUS: 40 mmHg (ref 30–55)
PO2 VENOUS: 40 mmHg (ref 30–55)

## 2024-04-26 LAB — MAGNESIUM: MAGNESIUM: 1.9 mg/dL (ref 1.6–2.6)

## 2024-04-26 MED ADMIN — sodium chloride (NS) 0.9 % flush 10 mL: 10 mL | INTRAVENOUS | @ 07:00:00

## 2024-04-26 MED ADMIN — sodium chloride (NS) 0.9 % flush 10 mL: 10 mL | INTRAVENOUS | @ 15:00:00

## 2024-04-26 MED ADMIN — sodium chloride (NS) 0.9 % flush 10 mL: 10 mL | INTRAVENOUS | @ 23:00:00

## 2024-04-26 MED ADMIN — rifAXIMin (XIFAXAN) oral suspension: 550 mg | GASTROENTERAL | @ 14:00:00 | Stop: 2025-04-11

## 2024-04-26 MED ADMIN — rifAXIMin (XIFAXAN) oral suspension: 550 mg | GASTROENTERAL | @ 03:00:00 | Stop: 2025-04-11

## 2024-04-26 MED ADMIN — folic acid (FOLVITE) tablet 1 mg: 1 mg | GASTROENTERAL | @ 14:00:00

## 2024-04-26 MED ADMIN — metoPROLOL tartrate (Lopressor) tablet 25 mg: 25 mg | GASTROENTERAL | @ 14:00:00

## 2024-04-26 MED ADMIN — metoPROLOL tartrate (Lopressor) tablet 25 mg: 25 mg | GASTROENTERAL | @ 03:00:00

## 2024-04-26 MED ADMIN — lactulose oral solution: 30 g | GASTROENTERAL | @ 11:00:00 | Stop: 2024-04-26

## 2024-04-26 MED ADMIN — lactulose oral solution: 30 g | GASTROENTERAL | @ 07:00:00 | Stop: 2024-04-26

## 2024-04-26 MED ADMIN — lactulose oral solution: 30 g | GASTROENTERAL | @ 03:00:00

## 2024-04-26 MED ADMIN — lactulose oral solution: 30 g | GASTROENTERAL | @ 15:00:00 | Stop: 2024-04-26

## 2024-04-26 MED ADMIN — thiamine mononitrate (vit B1) tablet 100 mg: 100 mg | GASTROENTERAL | @ 14:00:00

## 2024-04-26 MED ADMIN — ergocalciferol (DRISDOL) oral drops 200 mcg/mL (8,000 unit/mL): 100 ug | GASTROENTERAL | @ 14:00:00 | Stop: 2024-05-21

## 2024-04-26 MED ADMIN — cyanocobalamin (vitamin B-12) tablet 1,000 mcg: 1000 ug | GASTROENTERAL | @ 14:00:00

## 2024-04-26 MED ADMIN — levETIRAcetam (KEPPRA) tablet 750 mg: 750 mg | GASTROENTERAL | @ 14:00:00

## 2024-04-26 MED ADMIN — levETIRAcetam (KEPPRA) tablet 750 mg: 750 mg | GASTROENTERAL | @ 03:00:00

## 2024-04-26 MED ADMIN — famotidine (PEPCID) tablet 20 mg: 20 mg | GASTROENTERAL | @ 14:00:00

## 2024-04-26 MED ADMIN — melatonin tablet 3 mg: 3 mg | GASTROENTERAL | @ 22:00:00

## 2024-04-26 MED ADMIN — apixaban (ELIQUIS) tablet 5 mg: 5 mg | GASTROENTERAL | @ 14:00:00

## 2024-04-26 MED ADMIN — apixaban (ELIQUIS) tablet 5 mg: 5 mg | GASTROENTERAL | @ 03:00:00

## 2024-04-26 MED ADMIN — zinc acetate oral solution: 50 mg | GASTROENTERAL | @ 22:00:00 | Stop: 2024-05-06

## 2024-04-26 MED ADMIN — esomeprazole (NEXIUM) granules 40 mg: 40 mg | GASTROENTERAL | @ 14:00:00

## 2024-04-26 MED ADMIN — multivitamins, therapeutic with minerals tablet 1 tablet: 1 | GASTROENTERAL | @ 14:00:00

## 2024-04-26 MED ADMIN — lacosamide (VIMPAT) tablet 100 mg: 100 mg | GASTROENTERAL | @ 14:00:00

## 2024-04-26 MED ADMIN — lacosamide (VIMPAT) tablet 100 mg: 100 mg | GASTROENTERAL | @ 03:00:00

## 2024-04-26 MED ADMIN — cupric chloride (copper) 2 mg in sodium chloride (NS) 0.9 % 250 mL IVPB: 2 mg | INTRAVENOUS | @ 14:00:00 | Stop: 2024-04-26

## 2024-04-26 MED ADMIN — magnesium sulfate 2gm/50mL IVPB: 2 g | INTRAVENOUS | @ 14:00:00 | Stop: 2024-04-26

## 2024-04-26 NOTE — Progress Notes (Signed)
 MICU Daily Progress Note     Date of Service: 04/26/2024    Problem List:   Principal Problem:    Bacteremia, escherichia coli  Active Problems:    Alcoholic liver disease (HHS-HCC)    HTN (hypertension)    Depression with anxiety    Class 2 severe obesity with serious comorbidity in adult    Atrial fibrillation    (CMS-HCC)    Pleural effusion    Hypernatremia    Thrombocytopenia    Cirrhosis    (CMS-HCC)    A-fib (CMS-HCC)    Hepatic encephalopathy    (CMS-HCC)    Anaphylaxis      Interval history: [name] is a 78 y.o. female with HFpEF, HTN, Afib on AC, MASLD cirrhosis, originally hospitalized for scheduled TEE/DCCV (aborted d/t LAA clot) c/b AHHRF, encephalopathy and septic shock requiring MICU care, was s/p extubation transferred to [facility] for further management of anticoagulation and hypernatremia. Transferred back to [facility] MICU for closer monitoring for respiratory status, pressor requirement, GI bleed, and management of carotid artery injury.     On 10/24, patient had acute worsening of respiratory status requiring intubation. When patient was given sedation for intubation, she coded and promptly had ROSC after a few minutes of CPR. Subsequently, CVC placed in neck, used to draw blood, unclear if given pressors through it, then found  to be in arterial system based on blood gas and imaging, likely right carotid. Line was subsequently removed, and pressure was held for 15 minutes, hemostasis with no hematoma. She was transferred to [facility] MICU for management of this carotid injury, to be evaluated by vascular surgery while also managing her increased vasopressor requirement and respiratory distress.  Has been weaned off pressors since 10/27 at 1 PM. VBG's indicating that she is ready for extubation, however her mentation suggests that she is not able to protect her airway.  She was extubated on 10/31, and mentation slowly improved.  Unfortunately on the morning of 11/2 she had increased lethargy, increased secretions, and she was not protecting her airway.  She was reintubated on 11/2, and required pressor support with norepinephrine  and vasopressin  after the induction agents.  Self extubated on 11/5. Re intubated on 11/6 due to hypercarbia and increased work of breathing.  We are left with the challenging options regarding the care of [name].  Due to her multiple failed extubations, we are concerned [name] will need a tracheostomy long-term to protect her airway.  There is a meeting with palliative care and family on 04/13/2024, her family agreed for tracheostomy.  Tracheostomy placed 11/14.  On 11/16, patient started presenting with altered mental status as she became hard to arouse and stopped following commands.  Sepsis workup was initiated.  She also had a lateral gaze on exam, so neurology was consulted.  EEG showed seizure activity and MRI brain showed diffuse cortical restricted diffusion greatest within the bilateral parietal -occipital regions.  Her ammonia level is also elevated to 291.  It is unclear if seizures are related to her ammonia level.  Patient placed on Keppra  and Vimpat .    Plans for family meeting next week (12/1) to discuss goals of care.  Palliative is involved.    24 hour events:   - more awake/interactive   - EEG  - continue home Eliquis  5 BID  - Decrease Goal stool output to 500 mL - 1L daily  - Decrease lactulose  to TID    Neurological     Hepatic encephalopathy -  Non-Convulsive Seizures   Rapid response called on 10/24 at [facility] for somnolence. Ddx  hepatic encephalopathy, toxic metabolic encephalopathy in the setting of shock (possibly sepsis), intracranial pathology. Intubated.  No acute abnormality on 10/24 head CT. Failed extubation attempts and tracheostomy placed on 11/14. Overnight on 04/18/2024, patient noted to have eye deviation along with decreased responsiveness on physical exam.   Head CT reassuring.  MRI brain with diffuse cortical restricted diffusion greatest within the bilateral parieto-occipital regions, unclear etiology. 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 Neurology consulted.  EEG with evidence of seizure activity, therefore started on Keppra  and vimpat . Ammonia significantly elevated. Clinical status changed in the setting of missed lactulose  doses (due to copious stool output) - suspect possible hyperammonemia leading to seizures. Some improvement of mental status with resuming lactulose  and adequate stool output.   - Neurology following, appreciate recommendations  - Plan to obtain MRI brain in 2 weeks (~12/4)   - Decrease Lactulose  30g to TID  - Rifaximin  550 mg twice daily   - Continue Keppra  750 mg twice daily (renally dosed)  - Continue Vimpat  100 mg twice daily  - Continue to monitor mental status  - Goals of care meeting next week with family, palliative on board   - opens eyes to stimulation, does not follow commands, minimally moves upper extremities  - Back on EEG per neurology    Analgesia: No pain issues  RASS at goal? N/A, not on sedation  [place] Agitation Assessment Scale (RASS) : -1 (04/26/2024  4:00 PM)      Pulmonary       Acute Hypoxic Hypercarbic Respiratory Failure   Rapid response called on 10/24 for increased somnolence, found to have acute hypercarbic respiratory failure and subsequently intubated. Suspect hypercarbic respiratory failure due to encephalopathy as above. CXR on 10/24 showed worsening pleural effusions. Given patient's hypoxia, and AMS, there was concern for infectious etiology.  Infectious workup overall negative. Unable to protect her airway due to her current mentation.  Reintubated on 11/2 followed by therapeutic bronchoscopy.  Self extubated on 11/5.  Reintubate noted on 11/6 due to hypercarbia and not protecting her airway. Trach placed 11/14, complicated by anaphylactic reaction to rocuronium  which was treated with epi, benadryl  and steroids. Allergy added to chart.  First trach change completed on 04/20/2024 by ENT.  - Continue mechanical ventilation, weaning as tolerated  - Trial of high flow trach collar as tolerated  -  VBG q12  - gases stable this AM  - Has not tolerated high flow trach collar, becomes tachypneic, Has been on set rate overnight with pressure support in the morning  - Given return to set rate in the PM on 11/25, did not trial HFNC      Vent Mode: PCV  FiO2 (%): 30 %  S RR: 12  S VT: 330 mL  PEEP: 5 cm H20  PR SUP: 12 cm H20    Cardiovascular   Carotid Artery Injury (resolved)  At [facility], CVC placed intended for RIJ on 10/24 following intubation, PEA, ROSC. Was used with known blood return. Unclear if it was used for pressor support, but highly likely that it was. CXR for placement check showed concern for intra aterial location, and blood gas confirmed it was arterial. Thought to be in carotid artery. CVC removed with pressure held for 15 minutes, with no hematoma formation. On arrival, no physical exam and bedside ultrasound showed no large hematoma. No active bleeding.  Vascular Surgery consulted, stated fistula  has healed based on Ultrasound findings.  - CTM    PEA arrest s/p ROSC  Hypovolemic/Hemorrhagic shock (resolved)  Atrial fibrillation  known left atrial appendage clot  HFpEF  Patient with PEA arrest peri-intubation with ROSC. Shock thought to be hemorrhagic in the setting of GI bleed History of atrial fibrillation with known LAA clot and HFpEF. Vasopressors were weaned off 10/27. APTT was elevated the morning of 10/28 necessitating changing anticoagulation from therapeutic heparin  to therapeutic Lovenox  .   - Metoprolol  25 mg twice daily      Renal   Abnormal Electrolytes - Stable Hypernatremia  At [facility], had persistently hypernatremia near 153. Had been treated with D5 gtt. Sodium throughout admission has largely been 146-148. Sodium has overall normalized and continues to be within normal limits.  - Strict I/O's  - BMP daily  - Magnesium  and potassium repleted 11/24  - Good urine output  - Hypernatremia has resolved  - Stable 11/25      Infectious Disease/Autoimmune   E. Coli Bacteremia (resolved),   Initially admitted for afib, found to have E. coli bacteremia on October 10. This was treated with cefazolin . Rapid response called 10/24, patient noted to be hypothermic. WBCs jumped from 3.0 to 9.7 on 10/24. Although the most recent lab was following intubation and CPR. UA with pyuria, rare bacteria, hematuria. Peri-intubation, patient developed significant hypotension requiring initiation of NE and vasopressin . Suspect possible septic shock with unknown infectious source. Patient started on vancomycin  and cefepime  10/24. CT Abdomen pelvis concerning for aspiration pneumonia. Following infectious workup was unremarkable. Antibiotics were stopped on 10/27.  Infectious workup was repeated on 11/3 due to reintubation and status post bronchoscopy.  Completed course of ceftriaxone .  - CTM    Cultures:  Blood Culture, Routine (no units)   Date Value   04/17/2024 No Growth at 5 days   04/17/2024 No Growth at 5 days     Urine Culture, Comprehensive (no units)   Date Value   04/17/2024 NO GROWTH   04/09/2024 NO GROWTH     Lower Respiratory Culture (no units)   Date Value   04/10/2024 2+ Methicillin-Susceptible Staphylococcus aureus (A)   04/10/2024 1+ Oropharyngeal Flora Isolated     WBC (10*9/L)   Date Value   04/26/2024 7.4     WBC, UA (/HPF)   Date Value   04/17/2024 1       FEN/GI   Sigmoid Mass c/f Malignancy and Hematochezia (resolved)  Hemorrhagic Shock (resolved)  CT abdomen pelvis done 10/24 with evidence of cirrhosis and masslike thickening of the lower sigmoid colon concerning for malignancy.  CTA abdomen pelvis performed 1024 with active extravasation into the gastric fundus possibly secondary to traumatic enteric tube placement.  GI scoped the patient on 10/25 and clipped an area of active bleeding.  They removed a total of 1.5 L of blood.  Hemoglobin 10/20 6 AM downtrended to 6.0, so 2 units of blood transfused.  GI stated this was consistent with 10/25 scope and likely did not represent increased bleeding. Hb steadily improving with Hb of 9.2 on 10/28.   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 Given shock of unclear etiology. CTAP demonstrating cirrhosis. Likely that cirrhosis was contributing to hypotension.  Worsening altered mental status with seizure-like activity in the  setting of hyperammonemia after missing 2-3 lactulose  doses due to copious stool output.  Evidence of seizure activity on EEG.  Mental status with slow improvement once lactulose  was resumed.  - Continue lactulose  30 g every 4 hours  - Decrease Goal stool output to 500 mL - 1L daily  - FMS in place  - MELD labs and CMP    Vitamin Deficiencies  Low vitamin D , copper , and zinc .  - Appreciate dietitian recommendations  - Continue tube feeds (currently at goal)  - Zinc  repletion  - Multivitamin 1 tablet daily  - Folic acid  1 mg daily  - Copper  2 mg IV 11/24  - Vitamin B12 1000 mcg daily  - Repeat Serum copper  pending     MELD 3.0: 15 at 04/05/2024  4:31 AM  MELD-Na: 13 at 04/05/2024  4:31 AM  Calculated from:  Serum Creatinine: 0.43 mg/dL (Using min of 1 mg/dL) at 88/09/7972  5:68 AM  Serum Sodium: 143 mmol/L (Using max of 137 mmol/L) at 04/05/2024  4:31 AM  Total Bilirubin: 1.3 mg/dL at 88/09/7972  5:68 AM  Serum Albumin : 2.4 g/dL at 88/09/7972  5:68 AM  INR(ratio): 1.61 at 04/03/2024 10:26 AM  Age at listing (hypothetical): 78 years  Sex: Female at 04/05/2024  4:31 AM    Provider Malnutrition Assessment:  Body mass index is 41.75 kg/m??. BMI Interpretation: >/= 30 and < 40, consistent with obesity, clinically significant requiring additional resources and complicating multiple aspects of patient care.  GLIM criteria:   Pt does not meet criteria  -I have screened this patient for malnutrition and they did NOT meet criteria for malnutrition based on GLIM criteria.  -TF and FWF; Dietician consulted, appreciate assistance      Heme/Coag   Acute blood loss anemia (Stable)  Hemoglobin down trended to 6.0 on the morning of 10/26 thought to be secondary to acute GI bleed that was scoped and clipped 10/25.  H/H stable.  - CBC daily  - Maintain active T&S    Endocrine   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)   - home anastrozole     Integumentary     NAI   - No indication for WOCN consult    Prophylaxis/LDA/Restraints/Consults   ICU Checklist completed: yes (see ICU rounding navigator in Epic)    Patient Lines/Drains/Airways Status       Active Active Lines, Drains, & Airways       Name Placement date Placement time Site Days    Tracheostomy Shiley 6 04/20/24  1430  6  6    NG/OG Tube Feedings 10 Fr. Right nostril 04/01/24  1138  Right nostril  25    External Urinary Device 04/13/24 With Suction 04/13/24  1700  -- 12    PICC Double Lumen 04/06/24 Left Basilic 04/06/24  1558  Basilic  20    Peripheral IV 88/79/74 Right Wrist 04/21/24  0830  Wrist  5                  Patient Lines/Drains/Airways Status       Active Wounds       Name Placement date Placement time Site Days    Wound 03/13/24 Irritant Contact Dermatitis Incontinence Sacrum Mid gluteal cleft MASD? 03/13/24  --  Sacrum  44                  Goals of Care     Code Status:   Orders  Placed This Encounter   Procedures    Full Code     Standing Status:   Standing     Number of Occurrences:   1        Designated Healthcare Decision Maker:  Ms. Zawistowski designated healthcare decision maker(s) is/are   HCDM (patient stated preference): Gainer,[name] - Spouse - [phone]    HCDM (patient stated preference): Luthi,[name] - Daughter - [phone]. See HCDM section of Epic sidebar/storyboard or ACP tab in patient chart for details regarding active HCDMs and patient capacity for decision-making.      Subjective     Intubated.  Withdraws to pain. Opens eyes to stimulation, minimally interactive.  Unable to reliably follow commands.    Objective     Vitals - past 24 hours  Temp:  [36.7 ??C (98 ??F)-37.2 ??C (98.9 ??F)] 36.7 ??C (98 ??F)  Pulse:  [82-104] 93  SpO2 Pulse:  [81-102] 95  Resp:  [12-37] 30  BP: (92-139)/(37-81) 127/53  FiO2 (%):  [30 %] 30 %  SpO2:  [95 %-100 %] 95 % Intake/Output  I/O last 3 completed shifts:  In: 2739.6 [P.O.:120; I.V.:784.6; NG/GT:1180; IV Piggyback:655]  Out: 3980 [Urine:780; Stool:3200]     Physical Exam:    General: Intubated.   HEENT: normocephalic, atraumatic.  Trach in place.   CV: pulses palpable, regular borderline tachycardia   Pulm: Rhonchi w transmitted upper airway sounds throughout  GI: soft, NTND, + BS  MSK: Bilateral LE pitting edema with weeping over left lower extremity  Skin: Moist skin breakdown around trach.  Neuro: Withdraws to painful stimuli.  Not following commands.  Reflexes intact    Continuous Infusions:   Infusions Meds[1]    Scheduled Medications:   Scheduled Medications[2]    PRN medications:  PRN Medications[3]    Data/Imaging Review: Reviewed in Epic and personally interpreted on 04/26/2024. See EMR for detailed results.    Lacinda Rimes, MD  PGY-1, [facility] Internal Medicine               [1] [2]    apixaban   5 mg Enteral tube: gastric BID    cyanocobalamin  (vitamin B-12)  1,000 mcg Enteral tube: gastric Daily    ergocalciferol   100 mcg Enteral tube: gastric Daily    esomeprazole   40 mg Enteral tube: gastric Daily    famotidine   20 mg Enteral tube: gastric Daily    flu vac 2025 [id number](PF)  0.5 mL Intramuscular During hospitalization    folic acid   1 mg Enteral tube: gastric Daily    lacosamide   100 mg Enteral tube: gastric BID    lactulose   30 g Enteral tube: gastric TID    levETIRAcetam   750 mg Enteral tube: gastric BID    melatonin  3 mg Enteral tube: gastric QPM    metoPROLOL  tartrate  25 mg Enteral tube: gastric BID    multivitamins (ADULT)  1 tablet Enteral tube: gastric Daily    rifAXIMin   550 mg Enteral tube: gastric BID    sodium chloride   10 mL Intravenous Q8H    sodium chloride   10 mL Intravenous Q8H    thiamine  mononitrate (vit B1)  100 mg Enteral tube: gastric Daily    zinc  acetate  50 mg elem zinc  Enteral tube: gastric Daily   [3] acetaminophen 

## 2024-04-26 NOTE — Plan of Care (Signed)
 Pt drowsy, RASS -1. Not following commands. Reflexes intact. On PS for several hours during day and now back on a rate. Afib and afebrile. Over 1L output from FMS.     Problem: Skin Injury Risk Increased  Goal: Skin Health and Integrity  Intervention: Optimize Skin Protection  Recent Flowsheet Documentation  Taken 04/26/2024 1600 by Christophe Sabra SAILOR, RN  Head of Bed [facility]) Positioning: HOB at 30-45 degrees  Taken 04/26/2024 1200 by Christophe Sabra SAILOR, RN  Head of Bed [facility]) Positioning: HOB at 30-45 degrees  Taken 04/26/2024 0800 by Christophe Sabra SAILOR, RN  Activity Management: in bed  Pressure Reduction Techniques:   frequent weight shift encouraged   heels elevated off bed   weight shift assistance provided  Head of Bed (HOB) Positioning: HOB at 30-45 degrees  Pressure Reduction Devices:   heel offloading device utilized   positioning supports utilized   pressure-redistributing mattress utilized   specialty bed utilized  Skin Protection: adhesive use limited     Problem: Adult Inpatient Plan of Care  Goal: Absence of Hospital-Acquired Illness or Injury  Intervention: Identify and Manage Fall Risk  Recent Flowsheet Documentation  Taken 04/26/2024 0800 by Christophe Sabra SAILOR, RN  Safety Interventions:   aspiration precautions   bed alarm  Intervention: Prevent Skin Injury  Recent Flowsheet Documentation  Taken 04/26/2024 1600 by Christophe Sabra SAILOR, RN  Positioning for Skin: Left  Taken 04/26/2024 1400 by Christophe Sabra SAILOR, RN  Positioning for Skin: Right  Taken 04/26/2024 1200 by Christophe Sabra SAILOR, RN  Positioning for Skin: Left  Taken 04/26/2024 1000 by Christophe Sabra SAILOR, RN  Positioning for Skin: Right  Taken 04/26/2024 0800 by Christophe Sabra SAILOR, RN  Positioning for Skin: Left  Device Skin Pressure Protection: absorbent pad utilized/changed  Skin Protection: adhesive use limited  Intervention: Prevent Infection  Recent Flowsheet Documentation  Taken 04/26/2024 0800 by Christophe Sabra SAILOR, RN  Infection Prevention: cohorting utilized     Problem: Breathing Pattern Ineffective  Goal: Effective Breathing Pattern  Intervention: Promote Improved Breathing Pattern  Recent Flowsheet Documentation  Taken 04/26/2024 1600 by Christophe Sabra SAILOR, RN  Head of Bed [facility]) Positioning: HOB at 30-45 degrees  Taken 04/26/2024 1200 by Christophe Sabra SAILOR, RN  Head of Bed [facility]) Positioning: HOB at 30-45 degrees  Taken 04/26/2024 0800 by Christophe Sabra SAILOR, RN  Head of Bed [facility]) Positioning: HOB at 30-45 degrees     Problem: Fall Injury Risk  Goal: Absence of Fall and Fall-Related Injury  Intervention: Promote Injury-Free Environment  Recent Flowsheet Documentation  Taken 04/26/2024 0800 by Christophe Sabra SAILOR, RN  Safety Interventions:   aspiration precautions   bed alarm     Problem: Gas Exchange Impaired  Goal: Optimal Gas Exchange  Intervention: Optimize Oxygenation and Ventilation  Recent Flowsheet Documentation  Taken 04/26/2024 1600 by Christophe Sabra SAILOR, RN  Head of Bed [facility]) Positioning: HOB at 30-45 degrees  Taken 04/26/2024 1200 by Christophe Sabra SAILOR, RN  Head of Bed [facility]) Positioning: HOB at 30-45 degrees  Taken 04/26/2024 0800 by Christophe Sabra SAILOR, RN  Head of Bed [facility]) Positioning: HOB at 30-45 degrees     Problem: Non-Violent Restraints  Intervention: Utilize least restrictive measures  Recent Flowsheet Documentation  Taken 04/26/2024 1600 by Christophe Sabra SAILOR, RN  Less Restrictive Alternative:   Repositioning   Comfort Measures  Taken 04/26/2024 1400 by Christophe Sabra SAILOR, RN  Less Restrictive Alternative:   Repositioning   Comfort Measures  Taken 04/26/2024 1200 by  Christophe Sabra SAILOR, RN  Less Restrictive Alternative:   Repositioning   Comfort Measures  Taken 04/26/2024 1000 by Christophe Sabra SAILOR, RN  Less Restrictive Alternative:   Repositioning   Comfort Measures  Taken 04/26/2024 0800 by Christophe Sabra SAILOR, RN  Less Restrictive Alternative:   Repositioning   Comfort Measures  Intervention: Patient Monitoring  Recent Flowsheet Documentation  Taken 04/26/2024 1600 by Christophe Sabra SAILOR, RN  Psychological Status/Visual Check: Subdued  Circulation/Skin Integrity: No signs of injury  Range of Motion: Performed  Fluids: NPO  Food/Meal: Enteral feeding/TPN  Elimination: Rectal tube  Taken 04/26/2024 1400 by Christophe Sabra SAILOR, RN  Psychological Status/Visual Check: Subdued  Circulation/Skin Integrity: No signs of injury  Range of Motion: Performed  Fluids: NPO  Food/Meal: Enteral feeding/TPN  Elimination: Rectal tube  Taken 04/26/2024 1200 by Christophe Sabra SAILOR, RN  Psychological Status/Visual Check: Subdued  Circulation/Skin Integrity: No signs of injury  Range of Motion: Performed  Fluids: NPO  Food/Meal: Enteral feeding/TPN  Elimination: Rectal tube  Taken 04/26/2024 1000 by Christophe Sabra SAILOR, RN  Psychological Status/Visual Check: Asleep  Circulation/Skin Integrity: No signs of injury  Range of Motion: Performed  Fluids: NPO  Food/Meal: Enteral feeding/TPN  Elimination: Rectal tube  Taken 04/26/2024 0800 by Christophe Sabra SAILOR, RN  Psychological Status/Visual Check: Asleep  Circulation/Skin Integrity: No signs of injury  Range of Motion: Performed  Fluids: NPO  Food/Meal: Enteral feeding/TPN  Elimination: Rectal tube  Intervention: Patient Education  Recent Flowsheet Documentation  Taken 04/26/2024 1200 by Christophe Sabra SAILOR, RN  Criteria Explained: Yes  Patient's Response: Patient Unable to respond  Family Notification: Other     Problem: Wound  Goal: Optimal Functional Ability  Intervention: Optimize Functional Ability  Recent Flowsheet Documentation  Taken 04/26/2024 0800 by Christophe Sabra SAILOR, RN  Activity Management: in bed  Goal: Absence of Infection Signs and Symptoms  Intervention: Prevent or Manage Infection  Recent Flowsheet Documentation  Taken 04/26/2024 0800 by Christophe Sabra SAILOR, RN  Infection Management: aseptic technique maintained  Goal: Skin Health and Integrity  Intervention: Optimize Skin Protection  Recent Flowsheet Documentation  Taken 04/26/2024 1600 by Christophe Sabra SAILOR, RN  Head of Bed [facility]) Positioning: HOB at 30-45 degrees  Taken 04/26/2024 1200 by Christophe Sabra SAILOR, RN  Head of Bed [facility]) Positioning: HOB at 30-45 degrees  Taken 04/26/2024 0800 by Christophe Sabra SAILOR, RN  Activity Management: in bed  Pressure Reduction Techniques:   frequent weight shift encouraged   heels elevated off bed   weight shift assistance provided  Head of Bed (HOB) Positioning: HOB at 30-45 degrees  Pressure Reduction Devices:   heel offloading device utilized   positioning supports utilized   pressure-redistributing mattress utilized   specialty bed utilized  Skin Protection: adhesive use limited     Problem: Mechanical Ventilation Invasive  Goal: Optimal Device Function  Intervention: Optimize Device Care and Function  Recent Flowsheet Documentation  Taken 04/26/2024 1600 by Christophe Sabra SAILOR, RN  Oral Care:   mouth swabbed   oral rinse provided   suction provided   teeth brushed   tongue brushed  Taken 04/26/2024 1200 by Christophe Sabra SAILOR, RN  Oral Care:   mouth swabbed   oral rinse provided   suction provided   teeth brushed   tongue brushed  Goal: Absence of Device-Related Skin and Tissue Injury  Intervention: Maintain Skin and Tissue Health  Recent Flowsheet Documentation  Taken 04/26/2024 0800 by Christophe Sabra SAILOR, RN  Device Skin Pressure  Protection: absorbent pad utilized/changed  Goal: Absence of Ventilator-Induced Lung Injury  Intervention: Prevent Ventilator-Associated Pneumonia  Recent Flowsheet Documentation  Taken 04/26/2024 1600 by Christophe Sabra SAILOR, RN  Head of Bed [facility]) Positioning: HOB at 30-45 degrees  Oral Care:   mouth swabbed   oral rinse provided   suction provided   teeth brushed   tongue brushed  Taken 04/26/2024 1200 by Christophe Sabra SAILOR, RN  Head of Bed [facility]) Positioning: HOB at 30-45 degrees  Oral Care:   mouth swabbed   oral rinse provided   suction provided   teeth brushed   tongue brushed  Taken 04/26/2024 0800 by Christophe Sabra SAILOR, RN  Head of Bed [facility]) Positioning: HOB at 30-45 degrees     Problem: Mechanical Ventilation Invasive  Goal: Optimal Device Function  Intervention: Optimize Device Care and Function  Recent Flowsheet Documentation  Taken 04/26/2024 1600 by Christophe Sabra SAILOR, RN  Oral Care:   mouth swabbed   oral rinse provided   suction provided   teeth brushed   tongue brushed  Taken 04/26/2024 1200 by Christophe Sabra SAILOR, RN  Oral Care:   mouth swabbed   oral rinse provided   suction provided   teeth brushed   tongue brushed  Goal: Absence of Device-Related Skin and Tissue Injury  Intervention: Maintain Skin and Tissue Health  Recent Flowsheet Documentation  Taken 04/26/2024 0800 by Christophe Sabra SAILOR, RN  Device Skin Pressure Protection: absorbent pad utilized/changed  Goal: Absence of Ventilator-Induced Lung Injury  Intervention: Prevent Ventilator-Associated Pneumonia  Recent Flowsheet Documentation  Taken 04/26/2024 1600 by Christophe Sabra SAILOR, RN  Head of Bed [facility]) Positioning: HOB at 30-45 degrees  Oral Care:   mouth swabbed   oral rinse provided   suction provided   teeth brushed   tongue brushed  Taken 04/26/2024 1200 by Christophe Sabra SAILOR, RN  Head of Bed [facility]) Positioning: HOB at 30-45 degrees  Oral Care:   mouth swabbed   oral rinse provided   suction provided   teeth brushed   tongue brushed  Taken 04/26/2024 0800 by Christophe Sabra SAILOR, RN  Head of Bed [facility]) Positioning: HOB at 30-45 degrees     Problem: Mechanical Ventilation Invasive  Goal: Optimal Device Function  Intervention: Optimize Device Care and Function  Recent Flowsheet Documentation  Taken 04/26/2024 1600 by Christophe Sabra SAILOR, RN  Oral Care:   mouth swabbed   oral rinse provided   suction provided   teeth brushed   tongue brushed  Taken 04/26/2024 1200 by Christophe Sabra SAILOR, RN  Oral Care:   mouth swabbed   oral rinse provided   suction provided   teeth brushed   tongue brushed  Goal: Absence of Device-Related Skin and Tissue Injury  Intervention: Maintain Skin and Tissue Health  Recent Flowsheet Documentation  Taken 04/26/2024 0800 by Christophe Sabra SAILOR, RN  Device Skin Pressure Protection: absorbent pad utilized/changed  Goal: Absence of Ventilator-Induced Lung Injury  Intervention: Prevent Ventilator-Associated Pneumonia  Recent Flowsheet Documentation  Taken 04/26/2024 1600 by Christophe Sabra SAILOR, RN  Head of Bed [facility]) Positioning: HOB at 30-45 degrees  Oral Care:   mouth swabbed   oral rinse provided   suction provided   teeth brushed   tongue brushed  Taken 04/26/2024 1200 by Christophe Sabra SAILOR, RN  Head of Bed [facility]) Positioning: HOB at 30-45 degrees  Oral Care:   mouth swabbed   oral rinse provided   suction provided   teeth brushed  tongue brushed  Taken 04/26/2024 0800 by Christophe Sabra SAILOR, RN  Head of Bed [facility]) Positioning: HOB at 30-45 degrees     Problem: Mechanical Ventilation Invasive  Goal: Optimal Device Function  Intervention: Optimize Device Care and Function  Recent Flowsheet Documentation  Taken 04/26/2024 1600 by Christophe Sabra SAILOR, RN  Oral Care:   mouth swabbed   oral rinse provided   suction provided   teeth brushed   tongue brushed  Taken 04/26/2024 1200 by Christophe Sabra SAILOR, RN  Oral Care:   mouth swabbed   oral rinse provided   suction provided   teeth brushed   tongue brushed  Goal: Absence of Device-Related Skin and Tissue Injury  Intervention: Maintain Skin and Tissue Health  Recent Flowsheet Documentation  Taken 04/26/2024 0800 by Christophe Sabra SAILOR, RN  Device Skin Pressure Protection: absorbent pad utilized/changed  Goal: Absence of Ventilator-Induced Lung Injury  Intervention: Prevent Ventilator-Associated Pneumonia  Recent Flowsheet Documentation  Taken 04/26/2024 1600 by Christophe Sabra SAILOR, RN  Head of Bed [facility]) Positioning: HOB at 30-45 degrees  Oral Care:   mouth swabbed   oral rinse provided   suction provided   teeth brushed   tongue brushed  Taken 04/26/2024 1200 by Christophe Sabra SAILOR, RN  Head of Bed [facility]) Positioning: HOB at 30-45 degrees  Oral Care:   mouth swabbed   oral rinse provided   suction provided   teeth brushed   tongue brushed  Taken 04/26/2024 0800 by Christophe Sabra SAILOR, RN  Head of Bed [facility]) Positioning: HOB at 30-45 degrees     Problem: Infection  Goal: Absence of Infection Signs and Symptoms  Intervention: Prevent or Manage Infection  Recent Flowsheet Documentation  Taken 04/26/2024 0800 by Christophe Sabra SAILOR, RN  Infection Management: aseptic technique maintained     Problem: Mechanical Ventilation Invasive  Goal: Optimal Device Function  Intervention: Optimize Device Care and Function  Recent Flowsheet Documentation  Taken 04/26/2024 1600 by Christophe Sabra SAILOR, RN  Oral Care:   mouth swabbed   oral rinse provided   suction provided   teeth brushed   tongue brushed  Taken 04/26/2024 1200 by Christophe Sabra SAILOR, RN  Oral Care:   mouth swabbed   oral rinse provided   suction provided   teeth brushed   tongue brushed  Goal: Absence of Device-Related Skin and Tissue Injury  Intervention: Maintain Skin and Tissue Health  Recent Flowsheet Documentation  Taken 04/26/2024 0800 by Christophe Sabra SAILOR, RN  Device Skin Pressure Protection: absorbent pad utilized/changed  Goal: Absence of Ventilator-Induced Lung Injury  Intervention: Prevent Ventilator-Associated Pneumonia  Recent Flowsheet Documentation  Taken 04/26/2024 1600 by Christophe Sabra SAILOR, RN  Head of Bed [facility]) Positioning: HOB at 30-45 degrees  Oral Care:   mouth swabbed   oral rinse provided   suction provided   teeth brushed   tongue brushed  Taken 04/26/2024 1200 by Christophe Sabra SAILOR, RN  Head of Bed [facility]) Positioning: HOB at 30-45 degrees  Oral Care:   mouth swabbed   oral rinse provided   suction provided   teeth brushed   tongue brushed  Taken 04/26/2024 0800 by Christophe Sabra SAILOR, RN  Head of Bed [facility]) Positioning: HOB at 30-45 degrees     Problem: Mechanical Ventilation Invasive  Goal: Optimal Device Function  Intervention: Optimize Device Care and Function  Recent Flowsheet Documentation  Taken 04/26/2024 1600 by Christophe Sabra SAILOR, RN  Oral Care:   mouth swabbed   oral rinse  provided   suction provided   teeth brushed   tongue brushed  Taken 04/26/2024 1200 by Christophe Sabra SAILOR, RN  Oral Care:   mouth swabbed   oral rinse provided   suction provided   teeth brushed   tongue brushed  Goal: Absence of Device-Related Skin and Tissue Injury  Intervention: Maintain Skin and Tissue Health  Recent Flowsheet Documentation  Taken 04/26/2024 0800 by Christophe Sabra SAILOR, RN  Device Skin Pressure Protection: absorbent pad utilized/changed  Goal: Absence of Ventilator-Induced Lung Injury  Intervention: Prevent Ventilator-Associated Pneumonia  Recent Flowsheet Documentation  Taken 04/26/2024 1600 by Christophe Sabra SAILOR, RN  Head of Bed [facility]) Positioning: HOB at 30-45 degrees  Oral Care:   mouth swabbed   oral rinse provided   suction provided   teeth brushed   tongue brushed  Taken 04/26/2024 1200 by Christophe Sabra SAILOR, RN  Head of Bed [facility]) Positioning: HOB at 30-45 degrees  Oral Care:   mouth swabbed   oral rinse provided   suction provided   teeth brushed   tongue brushed  Taken 04/26/2024 0800 by Christophe Sabra SAILOR, RN  Head of Bed [facility]) Positioning: HOB at 30-45 degrees     Problem: Artificial Airway  Goal: Optimal Device Function  Intervention: Optimize Device Care and Function  Recent Flowsheet Documentation  Taken 04/26/2024 1600 by Christophe Sabra SAILOR, RN  Oral Care:   mouth swabbed   oral rinse provided   suction provided   teeth brushed   tongue brushed  Taken 04/26/2024 1200 by Christophe Sabra SAILOR, RN  Oral Care:   mouth swabbed   oral rinse provided   suction provided   teeth brushed   tongue brushed  Taken 04/26/2024 0800 by Christophe Sabra SAILOR, RN  Aspiration Precautions: awake/alert before oral intake  Goal: Absence of Device-Related Skin or Tissue Injury  Intervention: Maintain Skin and Tissue Health  Recent Flowsheet Documentation  Taken 04/26/2024 0800 by Christophe Sabra SAILOR, RN  Device Skin Pressure Protection: absorbent pad utilized/changed

## 2024-04-26 NOTE — Procedures (Signed)
 DAILY PRELIMINARY INTERIM LONGTERM VIDEO EEG MONITORING NOTE    Identifying Information   NAME: [name]    MRN: [id number]   DOB: [birth year]    LOC: 4326/4326-01    HISTORY: 78 y.o. female with history of HFpEF, afib, and MASLD cirrhosis admitted with respiratory failure, hepatic encephalopathy, and septic shock course now with seizure-like symptoms observed.     INDICATION:  Evaluate for Electrographic Seizures and Seizures     PATIENT STATE: Somnolent    Study Information  EEG Start: April 25, 2024 at 13:18  EEG End: April 25, 2024 at 19:46    Dr. Payton Marker from 04/25/24 at 13:18 to 04/25/24 at 17:00  Dr. Argentina Romano from 04/25/24 at 17:00 to 19:46    EEG TECHNICAL DESCRIPTION   Conditions of Recording:  Continuous EEG with simultaneous video recording was performed utilizing 21 active electrodes placed according to the international 10-20 system.  The study was recorded digitally with a bandpass of 1-70Hz  and a sampling rate of 200Hz  and was reviewed with the possibility of multiple reformatting.  The study was digitally processed with potential spike and seizure events identified for physician analysis and review.  Patient recognized events were identified by a push button marker and reviewed by the physician. Simultaneous video was reviewed for all patient events.    DAY 1a and 1b EEG DESCRIPTION: 04/25/24 at 13:18 to 04/25/24 at 17:00 Emily Marker, MD) and 04/25/24, 17:00-19:46 Lala Romano)   Relevant Medications: lacosamide  (Vimpat ) and levetiracetam  (Keppra )    Background:  No clear posterior dominant rhythm is observed. Background consists of diffsue delta activity with overriding faster frequencies, reactive and variable. States changes are seen but no N2 sleep structures observed.      Interictal Epileptiform Activity:  There are abundant BiLPDs arising independently from bilateral posterior head regions, maximal P8/O2 and O1, persistent throughout this recording    Ictal Activity:  There were no ictal patterns noted.    Events:  There were no indications for events of clinical concern.      EEG SUMMARY INTERPRETATION:   This is an abnormal EEG due to:  - Abundant BiLPDs arising independently from bilateral posterior head regions  - Continuous diffuse background slow    CLINICAL CORRELATION / SUMMARY of RECORDING:   These findings are consistent with a moderate encephalopathy. The bilateral independent lateralized periodic discharges (BiLPDs) seen over left and right posterior head regions can be seen in association with focal structural lesions and are also highly associated with seizures although no seizures have been seen on this recording         Interpreting Provider: Argentina Romano, MD and Argentina Romano, MD    [id number] Seizure Risk Score  Clinical risk score based on EEG findings and clinical history of seizures to aid in determination of optimal duration of EEG monitoring for detection of electrographic seizures.  Score has not been validated in patients under age 31 or following cardiac arrest.    Lateralized / Bilateral Independent Periodic Discharges or Lateralized Rhythmic Delta Activity - 1 point     Add 1 point if known history of epilepsy or prior clinical seizure.      Risk Group     Seizure Risk   at 72 hours   Duration of Monitoring    Seizure risk < 5%     Duration of Monitoring    Seizure risk < 2%       Low Risk,   Score = 0  3.1%   1 Hour   3.3 Hours     Medium Risk,   Score = 1     12%   12 Hours   29 Hours     High Risk,   Score = 2 or greater                >25%   >24 Hours   >30 Hours   Struck et al JAMA Neurology, January 2020    Not appropriate for EEG monitoring being performed for the following:  Treatment of status epilepticus or seizures already documented on EEG  Monitoring sedation/ burst suppression for management of intracranial pressure and/or paralyzed patients  Diagnostic evaluation of transient episodes concerning for possible seizures (spell capture)   Patients s/p cardiac arrest undergoing targeted temperature management      Amon dunker al. American Clinical Neurophysiology Society's Standardized Critical Care EEG Terminology: 2021 Version. Journal of Clinical Neurophysiology 38(1):p 1-29, January 2021.

## 2024-04-27 LAB — CBC W/ AUTO DIFF
BASOPHILS ABSOLUTE COUNT: 0 10*9/L (ref 0.0–0.1)
BASOPHILS RELATIVE PERCENT: 0.4 %
EOSINOPHILS ABSOLUTE COUNT: 0.2 10*9/L (ref 0.0–0.5)
EOSINOPHILS RELATIVE PERCENT: 2 %
HEMATOCRIT: 26.2 % — ABNORMAL LOW (ref 34.0–44.0)
HEMOGLOBIN: 8.6 g/dL — ABNORMAL LOW (ref 11.3–14.9)
LYMPHOCYTES ABSOLUTE COUNT: 1 10*9/L — ABNORMAL LOW (ref 1.1–3.6)
LYMPHOCYTES RELATIVE PERCENT: 10.9 %
MEAN CORPUSCULAR HEMOGLOBIN CONC: 32.9 g/dL (ref 32.0–36.0)
MEAN CORPUSCULAR HEMOGLOBIN: 31.7 pg (ref 25.9–32.4)
MEAN CORPUSCULAR VOLUME: 96.2 fL — ABNORMAL HIGH (ref 77.6–95.7)
MEAN PLATELET VOLUME: 9 fL (ref 6.8–10.7)
MONOCYTES ABSOLUTE COUNT: 0.8 10*9/L (ref 0.3–0.8)
MONOCYTES RELATIVE PERCENT: 9.2 %
NEUTROPHILS ABSOLUTE COUNT: 6.8 10*9/L (ref 1.8–7.8)
NEUTROPHILS RELATIVE PERCENT: 77.5 %
PLATELET COUNT: 162 10*9/L (ref 150–450)
RED BLOOD CELL COUNT: 2.73 10*12/L — ABNORMAL LOW (ref 3.95–5.13)
RED CELL DISTRIBUTION WIDTH: 18.5 % — ABNORMAL HIGH (ref 12.2–15.2)
WBC ADJUSTED: 8.8 10*9/L (ref 3.6–11.2)

## 2024-04-27 LAB — BASIC METABOLIC PANEL
ANION GAP: 10 mmol/L (ref 5–14)
BLOOD UREA NITROGEN: 17 mg/dL (ref 9–23)
BUN / CREAT RATIO: 41
CALCIUM: 8.2 mg/dL — ABNORMAL LOW (ref 8.7–10.4)
CHLORIDE: 105 mmol/L (ref 98–107)
CO2: 24 mmol/L (ref 20.0–31.0)
CREATININE: 0.41 mg/dL — ABNORMAL LOW (ref 0.55–1.02)
EGFR CKD-EPI (2021) FEMALE: >90 mL/min/1.73m2 (ref >=60)
GLUCOSE RANDOM: 140 mg/dL (ref 70–179)
POTASSIUM: 4.4 mmol/L (ref 3.4–4.8)
SODIUM: 139 mmol/L (ref 135–145)

## 2024-04-27 LAB — BLOOD GAS, VENOUS
BASE EXCESS VENOUS: -0.9 (ref -2.0–2.0)
BASE EXCESS VENOUS: -1.2 (ref -2.0–2.0)
CARBOXYHEMOGLOBIN, VENOUS: 1.7 % — ABNORMAL HIGH (ref <1.2)
CARBOXYHEMOGLOBIN, VENOUS: 1.7 % — ABNORMAL HIGH (ref <1.2)
HCO3 VENOUS: 24 mmol/L (ref 22–27)
HCO3 VENOUS: 24 mmol/L (ref 22–27)
METHEMOGLOBIN, VENOUS: <1 % (ref <1.5)
METHEMOGLOBIN, VENOUS: <1 % (ref <1.5)
O2 SATURATION VENOUS: 75.5 % (ref 40.0–85.0)
O2 SATURATION VENOUS: 63.2 % (ref 40.0–85.0)
OXYHEMOGLOBIN, VENOUS: 73.9 % (ref 40.0–85.0)
OXYHEMOGLOBIN, VENOUS: 61.9 % (ref 40.0–85.0)
PCO2 VENOUS: 40 mmHg (ref 40–60)
PCO2 VENOUS: 38 mmHg — ABNORMAL LOW (ref 40–60)
PH VENOUS: 7.38 (ref 7.32–7.43)
PH VENOUS: 7.4 (ref 7.32–7.43)
PO2 VENOUS: 44 mmHg (ref 30–55)
PO2 VENOUS: 35 mmHg (ref 30–55)

## 2024-04-27 LAB — CYSTATIN C
CYSTATIN C: 1.52 mg/L — ABNORMAL HIGH (ref 0.64–1.23)
EGFR CKD-EPI (2012) CYSTATIN C FEMALE: 39 mL/min/1.73m2 — ABNORMAL LOW (ref >=60)

## 2024-04-27 LAB — SODIUM, URINE, RANDOM: SODIUM URINE: <10 mmol/L

## 2024-04-27 LAB — HEPATIC FUNCTION PANEL
ALBUMIN: 2.3 g/dL — ABNORMAL LOW (ref 3.4–5.0)
ALKALINE PHOSPHATASE: 176 U/L — ABNORMAL HIGH (ref 46–116)
ALT (SGPT): 15 U/L (ref 10–49)
AST (SGOT): 33 U/L (ref <=34)
BILIRUBIN DIRECT: 0.4 mg/dL — ABNORMAL HIGH (ref 0.00–0.30)
BILIRUBIN TOTAL: 0.8 mg/dL (ref 0.3–1.2)
PROTEIN TOTAL: 5.5 g/dL — ABNORMAL LOW (ref 5.7–8.2)

## 2024-04-27 LAB — PHOSPHORUS: PHOSPHORUS: 2.9 mg/dL (ref 2.4–5.1)

## 2024-04-27 LAB — MAGNESIUM: MAGNESIUM: 2 mg/dL (ref 1.6–2.6)

## 2024-04-27 MED ADMIN — sodium chloride (NS) 0.9 % flush 10 mL: 10 mL | INTRAVENOUS | @ 09:00:00

## 2024-04-27 MED ADMIN — sodium chloride (NS) 0.9 % flush 10 mL: 10 mL | INTRAVENOUS | @ 17:00:00

## 2024-04-27 MED ADMIN — sodium chloride (NS) 0.9 % flush 10 mL: 10 mL | INTRAVENOUS

## 2024-04-27 MED ADMIN — rifAXIMin (XIFAXAN) oral suspension: 550 mg | GASTROENTERAL | @ 03:00:00 | Stop: 2025-04-11

## 2024-04-27 MED ADMIN — rifAXIMin (XIFAXAN) oral suspension: 550 mg | GASTROENTERAL | @ 13:00:00 | Stop: 2025-04-11

## 2024-04-27 MED ADMIN — lactulose oral solution: 30 g | GASTROENTERAL | @ 19:00:00

## 2024-04-27 MED ADMIN — lactulose oral solution: 30 g | GASTROENTERAL | @ 13:00:00

## 2024-04-27 MED ADMIN — lactulose oral solution: 30 g | GASTROENTERAL | @ 03:00:00

## 2024-04-27 MED ADMIN — folic acid (FOLVITE) tablet 1 mg: 1 mg | GASTROENTERAL | @ 13:00:00

## 2024-04-27 MED ADMIN — metoPROLOL tartrate (Lopressor) tablet 25 mg: 25 mg | GASTROENTERAL | @ 13:00:00

## 2024-04-27 MED ADMIN — metoPROLOL tartrate (Lopressor) tablet 25 mg: 25 mg | GASTROENTERAL | @ 03:00:00

## 2024-04-27 MED ADMIN — thiamine mononitrate (vit B1) tablet 100 mg: 100 mg | GASTROENTERAL | @ 13:00:00

## 2024-04-27 MED ADMIN — ergocalciferol (DRISDOL) oral drops 200 mcg/mL (8,000 unit/mL): 100 ug | GASTROENTERAL | @ 13:00:00 | Stop: 2024-05-21

## 2024-04-27 MED ADMIN — cyanocobalamin (vitamin B-12) tablet 1,000 mcg: 1000 ug | GASTROENTERAL | @ 13:00:00

## 2024-04-27 MED ADMIN — levETIRAcetam (KEPPRA) tablet 750 mg: 750 mg | GASTROENTERAL | @ 13:00:00

## 2024-04-27 MED ADMIN — levETIRAcetam (KEPPRA) tablet 750 mg: 750 mg | GASTROENTERAL | @ 03:00:00

## 2024-04-27 MED ADMIN — famotidine (PEPCID) tablet 20 mg: 20 mg | GASTROENTERAL | @ 13:00:00

## 2024-04-27 MED ADMIN — melatonin tablet 3 mg: 3 mg | GASTROENTERAL | @ 23:00:00

## 2024-04-27 MED ADMIN — apixaban (ELIQUIS) tablet 5 mg: 5 mg | GASTROENTERAL | @ 03:00:00

## 2024-04-27 MED ADMIN — apixaban (ELIQUIS) tablet 5 mg: 5 mg | GASTROENTERAL | @ 13:00:00

## 2024-04-27 MED ADMIN — zinc acetate oral solution: 50 mg | GASTROENTERAL | @ 23:00:00 | Stop: 2024-05-06

## 2024-04-27 MED ADMIN — esomeprazole (NEXIUM) granules 40 mg: 40 mg | GASTROENTERAL | @ 13:00:00

## 2024-04-27 MED ADMIN — multivitamins, therapeutic with minerals tablet 1 tablet: 1 | GASTROENTERAL | @ 13:00:00

## 2024-04-27 MED ADMIN — lacosamide (VIMPAT) tablet 100 mg: 100 mg | GASTROENTERAL | @ 13:00:00

## 2024-04-27 MED ADMIN — lacosamide (VIMPAT) tablet 100 mg: 100 mg | GASTROENTERAL | @ 03:00:00

## 2024-04-27 NOTE — Plan of Care (Signed)
 Pt on PCV overnight, trach secure and patent, trach care completed. NAD noted at this time.   Problem: Mechanical Ventilation Invasive  Goal: Effective Communication  Outcome: Ongoing - Unchanged  Goal: Optimal Device Function  Outcome: Ongoing - Unchanged  Intervention: Optimize Device Care and Function  Recent Flowsheet Documentation  Taken 04/27/2024 0215 by Jolaine Rosaline CROME, RRT  Airway/Ventilation Management: humidification applied  Taken 04/26/2024 2130 by Jolaine Rosaline CROME, RRT  Airway/Ventilation Management: humidification applied  Oral Care: mouth swabbed  Goal: Mechanical Ventilation Liberation  Outcome: Ongoing - Unchanged  Goal: Absence of Device-Related Skin and Tissue Injury  Outcome: Ongoing - Unchanged  Goal: Absence of Ventilator-Induced Lung Injury  Outcome: Ongoing - Unchanged  Intervention: Prevent Ventilator-Associated Pneumonia  Recent Flowsheet Documentation  Taken 04/27/2024 0215 by Jolaine Rosaline CROME, RRT  Head of Bed [facility]) Positioning: HOB at 30-45 degrees  Taken 04/26/2024 2130 by Jolaine Rosaline CROME, RRT  Head of Bed [facility]) Positioning: HOB at 30-45 degrees  Oral Care: mouth swabbed

## 2024-04-27 NOTE — Progress Notes (Signed)
 MICU Daily Progress Note     Date of Service: 04/27/2024    Problem List:   Principal Problem:    Bacteremia, escherichia coli  Active Problems:    Alcoholic liver disease (HHS-HCC)    HTN (hypertension)    Depression with anxiety    Class 2 severe obesity with serious comorbidity in adult    Atrial fibrillation    (CMS-HCC)    Pleural effusion    Hypernatremia    Thrombocytopenia    Cirrhosis    (CMS-HCC)    A-fib (CMS-HCC)    Hepatic encephalopathy    (CMS-HCC)    Anaphylaxis      Interval history: [name] is a 78 y.o. female with HFpEF, HTN, Afib on AC, MASLD cirrhosis, originally hospitalized for scheduled TEE/DCCV (aborted d/t LAA clot) c/b AHHRF, encephalopathy and septic shock requiring MICU care, was s/p extubation transferred to [facility] for further management of anticoagulation and hypernatremia. Transferred back to [facility] MICU for closer monitoring for respiratory status, pressor requirement, GI bleed, and management of carotid artery injury.     On 10/24, patient had acute worsening of respiratory status requiring intubation. When patient was given sedation for intubation, she coded and promptly had ROSC after a few minutes of CPR. Subsequently, CVC placed in neck, used to draw blood, unclear if given pressors through it, then found  to be in arterial system based on blood gas and imaging, likely right carotid. Line was subsequently removed, and pressure was held for 15 minutes, hemostasis with no hematoma. She was transferred to [facility] MICU for management of this carotid injury, to be evaluated by vascular surgery while also managing her increased vasopressor requirement and respiratory distress.  Has been weaned off pressors since 10/27 at 1 PM. VBG's indicating that she is ready for extubation, however her mentation suggests that she is not able to protect her airway.  She was extubated on 10/31, and mentation slowly improved.  Unfortunately on the morning of 11/2 she had increased lethargy, increased secretions, and she was not protecting her airway.  She was reintubated on 11/2, and required pressor support with norepinephrine  and vasopressin  after the induction agents.  Self extubated on 11/5. Re intubated on 11/6 due to hypercarbia and increased work of breathing.  We are left with the challenging options regarding the care of [name].  Due to her multiple failed extubations, we are concerned [name] will need a tracheostomy long-term to protect her airway.  There is a meeting with palliative care and family on 04/13/2024, her family agreed for tracheostomy.  Tracheostomy placed 11/14.  On 11/16, patient started presenting with altered mental status as she became hard to arouse and stopped following commands.  Sepsis workup was initiated.  She also had a lateral gaze on exam, so neurology was consulted.  EEG showed seizure activity and MRI brain showed diffuse cortical restricted diffusion greatest within the bilateral parietal -occipital regions.  Her ammonia level is also elevated to 291.  It is unclear if seizures are related to her ammonia level.  Patient placed on Keppra  and Vimpat .    Plans for family meeting next week (12/1) to discuss goals of care.  Palliative is involved.    24 hour events:  - more somnolent, has reflexes, afebrile, higher rates responded to scheduled metoprolol   - urinating well  - FMS is still maintaining well (9 days)  - Lactulose  pm 11/25 was held.  - EEG  - continue home Eliquis  5 BID  -  Goal stool output to 500 mL - 1L daily  - Continue lactulose  TID  - spoke to daughter 11/26  - urinary retention 11/26    Neurological     Hepatic encephalopathy - Non-Convulsive Seizures   Rapid response called on 10/24 at [facility] for somnolence. Ddx  hepatic encephalopathy, toxic metabolic encephalopathy in the setting of shock (possibly sepsis), intracranial pathology. Intubated.  No acute abnormality on 10/24 head CT. Failed extubation attempts and tracheostomy placed on 11/14. Overnight on 04/18/2024, patient noted to have eye deviation along with decreased responsiveness on physical exam.   Head CT reassuring.  MRI brain with diffuse cortical restricted diffusion greatest within the bilateral parieto-occipital regions, unclear etiology. 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 Neurology consulted.  EEG with evidence of seizure activity, therefore started on Keppra  and vimpat . Ammonia significantly elevated. Clinical status changed in the setting of missed lactulose  doses (due to copious stool output) - suspect possible hyperammonemia leading to seizures. Some improvement of mental status with resuming lactulose  and adequate stool output.   - Neurology following, appreciate recommendations  - Plan to obtain MRI brain in 2 weeks (~12/4)   - Continue Lactulose  30g to TID  - Rifaximin  550 mg twice daily  - Continue Keppra  750 mg twice daily (renally dosed)  - Continue Vimpat  100 mg twice daily  - Continue to monitor mental status  - Goals of care meeting next week with family, palliative on board   - opens eyes to stimulation, does not follow commands, minimally moves upper extremities  - Back on EEG per neurology    Analgesia: No pain issues  RASS at goal? N/A, not on sedation  [place] Agitation Assessment Scale (RASS) : -2 (04/27/2024 10:00 AM)      Pulmonary       Acute Hypoxic Hypercarbic Respiratory Failure   Rapid response called on 10/24 for increased somnolence, found to have acute hypercarbic respiratory failure and subsequently intubated. Suspect hypercarbic respiratory failure due to encephalopathy as above. CXR on 10/24 showed worsening pleural effusions. Given patient's hypoxia, and AMS, there was concern for infectious etiology.  Infectious workup overall negative. Unable to protect her airway due to her current mentation.  Reintubated on 11/2 followed by therapeutic bronchoscopy.  Self extubated on 11/5.  Reintubate noted on 11/6 due to hypercarbia and not protecting her airway. Trach placed 11/14, complicated by anaphylactic reaction to rocuronium  which was treated with epi, benadryl  and steroids. Allergy added to chart.  First trach change completed on 04/20/2024 by ENT.  - Continue mechanical ventilation, weaning as tolerated  - Trial of high flow trach collar as tolerated. Has not tolerated high flow trach collar, becomes tachypneic  - VBG q12  - Gases stable this AM  - Given return to set rate in the PM on 11/25, did not trial HFNC. Additionally, RN trialed 10 earlier this AM and did not tolerate well.       Vent Mode: PSV-CPAP  FiO2 (%): 30 %  S RR: 12  PEEP: 5 cm H20  PR SUP: 14 cm H20    Cardiovascular   Carotid Artery Injury (resolved)  At [facility], CVC placed intended for RIJ on 10/24 following intubation, PEA, ROSC. Was used with known blood return. Unclear if it was used for pressor support, but highly likely that it was. CXR for placement check showed concern for intra aterial location, and blood gas confirmed it was arterial. Thought to be in carotid artery. CVC removed with pressure held for  15 minutes, with no hematoma formation. On arrival, no physical exam and bedside ultrasound showed no large hematoma. No active bleeding.  Vascular Surgery consulted, stated fistula has healed based on Ultrasound findings.  - CTM    PEA arrest s/p ROSC  Hypovolemic/Hemorrhagic shock (resolved)  Atrial fibrillation  known left atrial appendage clot  HFpEF  Patient with PEA arrest peri-intubation with ROSC. Shock thought to be hemorrhagic in the setting of GI bleed History of atrial fibrillation with known LAA clot and HFpEF. Vasopressors were weaned off 10/27. APTT was elevated the morning of 10/28 necessitating changing anticoagulation from therapeutic heparin  to therapeutic Lovenox  .   - Metoprolol  25 mg twice daily      Renal     Abnormal Electrolytes - Hypernatremia (resolved) - Hypokalemia (resolved) - Hypomagnesemia (resolved)  At [facility], had persistently hypernatremia near 153. Had been treated with D5 gtt. Sodium throughout admission has largely been 146-148. Sodium has overall normalized and continues to be within normal limits.  - Strict I/O's  - BMP daily  - Magnesium  and potassium repleted 11/24  - Good urine output  - Stable 11/25    Urinary Retention  On 11/26, patient produced 200 mL urine. Bladder scan showed 60 mL retained fluid. On physical exam, has coarse breath sounds bilaterally with high pitting lower extremity edema. Uncertain if she is dry or fluid overloaded at this time. Will test as below.   - Urine Na pending. If <10, then can consider IVF fluids and she is dry and retaining fluids.  - Consider POCUS IVC 11/27    Infectious Disease/Autoimmune   E. Coli Bacteremia (resolved),   Initially admitted for afib, found to have E. coli bacteremia on October 10. This was treated with cefazolin . Rapid response called 10/24, patient noted to be hypothermic. WBCs jumped from 3.0 to 9.7 on 10/24. Although the most recent lab was following intubation and CPR. UA with pyuria, rare bacteria, hematuria. Peri-intubation, patient developed significant hypotension requiring initiation of NE and vasopressin . Suspect possible septic shock with unknown infectious source. Patient started on vancomycin  and cefepime  10/24. CT Abdomen pelvis concerning for aspiration pneumonia. Following infectious workup was unremarkable. Antibiotics were stopped on 10/27.  Infectious workup was repeated on 11/3 due to reintubation and status post bronchoscopy.  Completed course of ceftriaxone .  - CTM    Cultures:  Blood Culture, Routine (no units)   Date Value   04/17/2024 No Growth at 5 days   04/17/2024 No Growth at 5 days     Urine Culture, Comprehensive (no units)   Date Value   04/17/2024 NO GROWTH   04/09/2024 NO GROWTH     Lower Respiratory Culture (no units)   Date Value   04/10/2024 2+ Methicillin-Susceptible Staphylococcus aureus (A)   04/10/2024 1+ Oropharyngeal Flora Isolated     WBC (10*9/L)   Date Value   04/27/2024 8.8     WBC, UA (/HPF)   Date Value   04/17/2024 1       FEN/GI   Sigmoid Mass c/f Malignancy and Hematochezia (resolved)  Hemorrhagic Shock (resolved)  CT abdomen pelvis done 10/24 with evidence of cirrhosis and masslike thickening of the lower sigmoid colon concerning for malignancy.  CTA abdomen pelvis performed 1024 with active extravasation into the gastric fundus possibly secondary to traumatic enteric tube placement.  GI scoped the patient on 10/25 and clipped an area of active bleeding.  They removed a total of 1.5 L of blood.  Hemoglobin 10/20 6 AM downtrended to  6.0, so 2 units of blood transfused.  GI stated this was consistent with 10/25 scope and likely did not represent increased bleeding. Hb steadily improving with Hb of 9.2 on 10/28.   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 Given shock of unclear etiology. CTAP demonstrating cirrhosis. Likely that cirrhosis was contributing to hypotension.  Worsening altered mental status with seizure-like activity in the setting of hyperammonemia after missing 2-3 lactulose  doses due to copious stool output.  Evidence of seizure activity on EEG.  Mental status with slow improvement once lactulose  was resumed.  - Continue lactulose  as above  - Goal stool output 500 mL - 1L daily  - FMS in place  - MELD labs and CMP    Vitamin Deficiencies  Low vitamin D , copper , and zinc .  - Appreciate dietitian recommendations  - Continue tube feeds (currently at goal)  - Zinc  repletion  - Multivitamin 1 tablet daily  - Folic acid  1 mg daily  - Copper  2 mg IV 11/24  - Vitamin B12 1000 mcg daily  - Repeat Serum copper  pending     MELD 3.0: 15 at 04/05/2024  4:31 AM  MELD-Na: 13 at 04/05/2024  4:31 AM  Calculated from:  Serum Creatinine: 0.43 mg/dL (Using min of 1 mg/dL) at 88/09/7972  5:68 AM  Serum Sodium: 143 mmol/L (Using max of 137 mmol/L) at 04/05/2024  4:31 AM  Total Bilirubin: 1.3 mg/dL at 88/09/7972  5:68 AM  Serum Albumin : 2.4 g/dL at 88/09/7972  5:68 AM  INR(ratio): 1.61 at 04/03/2024 10:26 AM  Age at listing (hypothetical): 78 years  Sex: Female at 04/05/2024  4:31 AM    Provider Malnutrition Assessment:  Body mass index is 41.76 kg/m??. BMI Interpretation: >/= 30 and < 40, consistent with obesity, clinically significant requiring additional resources and complicating multiple aspects of patient care.  GLIM criteria:   Pt does not meet criteria  -I have screened this patient for malnutrition and they did NOT meet criteria for malnutrition based on GLIM criteria.  -TF and FWF; Dietician consulted, appreciate assistance      Heme/Coag   Acute blood loss anemia (Stable)  Hemoglobin down trended to 6.0 on the morning of 10/26 thought to be secondary to acute GI bleed that was scoped and clipped 10/25.  H/H stable.  - CBC daily  - Maintain active T&S    Endocrine   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)   - home anastrozole     Integumentary     RLE Shin Wound  - WOCN consulted 11/26, appreciate recs    Prophylaxis/LDA/Restraints/Consults   ICU Checklist completed: yes (see ICU rounding navigator in Epic)    Patient Lines/Drains/Airways Status       Active Active Lines, Drains, & Airways       Name Placement date Placement time Site Days    Tracheostomy Shiley 6 04/20/24  1430  6  6    NG/OG Tube Feedings 10 Fr. Right nostril 04/01/24  1138  Right nostril  26    External Urinary Device 04/13/24 With Suction 04/13/24  1700  -- 13    PICC Double Lumen 04/06/24 Left Basilic 04/06/24  1558  Basilic  20    Peripheral IV 88/79/74 Right  Wrist 04/21/24  0830  Wrist  6                  Patient Lines/Drains/Airways Status       Active Wounds       Name Placement date Placement time Site Days    Wound 03/13/24 Irritant Contact Dermatitis Incontinence Sacrum Mid gluteal cleft MASD? 03/13/24  --  Sacrum  45                  Goals of Care     Code Status:   Orders Placed This Encounter   Procedures    Full Code     Standing Status:   Standing     Number of Occurrences:   1        Designated Healthcare Decision Maker:  Ms. Swoboda designated healthcare decision maker(s) is/are   HCDM (patient stated preference): Persinger,[name] - Spouse - [phone]    HCDM (patient stated preference): Touchette,[name] - Daughter - [phone]. See HCDM section of Epic sidebar/storyboard or ACP tab in patient chart for details regarding active HCDMs and patient capacity for decision-making.      Subjective     Intubated.  Withdraws to pain. Does not rouse to voice alone. Opens eyes to stimulation, minimally interactive.  Unable to reliably follow commands.     Objective     Vitals - past 24 hours  Temp:  [36.7 ??C (98 ??F)-37.6 ??C (99.6 ??F)] 37.6 ??C (99.6 ??F)  Pulse:  [83-115] 94  SpO2 Pulse:  [81-111] 81  Resp:  [13-30] 24  BP: (110-153)/(41-94) 111/44  FiO2 (%):  [30 %] 30 %  SpO2:  [92 %-100 %] 99 % Intake/Output  I/O last 3 completed shifts:  In: 3393.7 [P.O.:120; NG/GT:2610; IV Piggyback:663.7]  Out: 4180 [Urine:1780; Stool:2400]     Physical Exam:    General: Intubated. Bilateral hands in mitts. R leg restraint in place.   HEENT: normocephalic, atraumatic.  Trach in place.   CV: Regular rate and rhythm, no m/r/g  Pulm: Coarse breath sounds bilaterally anteriorly  GI: soft, does not appear tender; non-distended. + BS  MSK: 3+ pitting Bilateral LE edema (legs to feet); hips difficult to assess. No noted weeping in left extremity. Wound on RLE shin that had trace amounts of either weeping or purulence (difficult to assess).   Skin: Moist skin breakdown around trach.  Neuro: Withdraws to painful stimuli.  Not following commands.  Reflexes intact    Continuous Infusions:   Infusions Meds[1]    Scheduled Medications:   Scheduled Medications[2]    PRN medications:  PRN Medications[3]    Data/Imaging Review: Reviewed in Epic and personally interpreted on 04/27/2024. See EMR for detailed results.    Lacinda Rimes, MD  PGY-1, [facility] Internal Medicine                 [1] [2]    apixaban   5 mg Enteral tube: gastric BID    cyanocobalamin  (vitamin B-12)  1,000 mcg Enteral tube: gastric Daily    ergocalciferol   100 mcg Enteral tube: gastric Daily    esomeprazole   40 mg Enteral tube: gastric Daily    famotidine   20 mg Enteral tube: gastric Daily    flu vac 2025 [id number](PF)  0.5 mL Intramuscular During hospitalization    folic acid   1 mg Enteral tube: gastric Daily    lacosamide   100 mg Enteral tube: gastric BID    lactulose   30 g Enteral tube: gastric TID  levETIRAcetam   750 mg Enteral tube: gastric BID    melatonin  3 mg Enteral tube: gastric QPM    metoPROLOL  tartrate  25 mg Enteral tube: gastric BID    multivitamins (ADULT)  1 tablet Enteral tube: gastric Daily    rifAXIMin   550 mg Enteral tube: gastric BID    sodium chloride   10 mL Intravenous Q8H    sodium chloride   10 mL Intravenous Q8H    thiamine  mononitrate (vit B1)  100 mg Enteral tube: gastric Daily    zinc  acetate  50 mg elem zinc  Enteral tube: gastric Daily   [3] acetaminophen 

## 2024-04-27 NOTE — Plan of Care (Signed)
 Problem: Skin Injury Risk Increased  Goal: Skin Health and Integrity  Intervention: Optimize Skin Protection  Recent Flowsheet Documentation  Taken 04/27/2024 1200 by Christophe Sabra SAILOR, RN  Head of Bed [facility]) Positioning: HOB at 30-45 degrees  Taken 04/27/2024 0800 by Christophe Sabra SAILOR, RN  Pressure Reduction Techniques:   frequent weight shift encouraged   heels elevated off bed   weight shift assistance provided  Head of Bed (HOB) Positioning: HOB at 30-45 degrees  Pressure Reduction Devices:   heel offloading device utilized   positioning supports utilized   pressure-redistributing mattress utilized   specialty bed utilized  Skin Protection: adhesive use limited     Problem: Adult Inpatient Plan of Care  Goal: Absence of Hospital-Acquired Illness or Injury  Intervention: Identify and Manage Fall Risk  Recent Flowsheet Documentation  Taken 04/27/2024 0800 by Christophe Sabra SAILOR, RN  Safety Interventions:   aspiration precautions   bed alarm   low bed   lighting adjusted for tasks/safety  Intervention: Prevent Skin Injury  Recent Flowsheet Documentation  Taken 04/27/2024 1800 by Christophe Sabra SAILOR, RN  Positioning for Skin: Right  Taken 04/27/2024 1600 by Christophe Sabra SAILOR, RN  Positioning for Skin: Left  Taken 04/27/2024 1400 by Christophe Sabra SAILOR, RN  Positioning for Skin: Right  Taken 04/27/2024 1200 by Christophe Sabra SAILOR, RN  Positioning for Skin: Left  Taken 04/27/2024 1000 by Christophe Sabra SAILOR, RN  Positioning for Skin: Right  Taken 04/27/2024 0800 by Christophe Sabra SAILOR, RN  Positioning for Skin: Left  Device Skin Pressure Protection: absorbent pad utilized/changed  Skin Protection: adhesive use limited  Intervention: Prevent Infection  Recent Flowsheet Documentation  Taken 04/27/2024 0800 by Christophe Sabra SAILOR, RN  Infection Prevention: cohorting utilized     Problem: Breathing Pattern Ineffective  Goal: Effective Breathing Pattern  Intervention: Promote Improved Breathing Pattern  Recent Flowsheet Documentation  Taken 04/27/2024 1200 by Christophe Sabra SAILOR, RN  Head of Bed [facility]) Positioning: HOB at 30-45 degrees  Taken 04/27/2024 0800 by Christophe Sabra SAILOR, RN  Head of Bed [facility]) Positioning: HOB at 30-45 degrees     Problem: Fall Injury Risk  Goal: Absence of Fall and Fall-Related Injury  Intervention: Promote Injury-Free Environment  Recent Flowsheet Documentation  Taken 04/27/2024 0800 by Christophe Sabra SAILOR, RN  Safety Interventions:   aspiration precautions   bed alarm   low bed   lighting adjusted for tasks/safety     Problem: Gas Exchange Impaired  Goal: Optimal Gas Exchange  Intervention: Optimize Oxygenation and Ventilation  Recent Flowsheet Documentation  Taken 04/27/2024 1200 by Christophe Sabra SAILOR, RN  Head of Bed [facility]) Positioning: HOB at 30-45 degrees  Taken 04/27/2024 0800 by Christophe Sabra SAILOR, RN  Head of Bed [facility]) Positioning: HOB at 30-45 degrees     Problem: Non-Violent Restraints  Intervention: Utilize least restrictive measures  Recent Flowsheet Documentation  Taken 04/27/2024 1800 by Christophe Sabra SAILOR, RN  Less Restrictive Alternative:   Repositioning   Comfort Measures  Taken 04/27/2024 1600 by Christophe Sabra SAILOR, RN  Less Restrictive Alternative:   Repositioning   Comfort Measures  Taken 04/27/2024 1400 by Christophe Sabra SAILOR, RN  Less Restrictive Alternative:   Repositioning   Comfort Measures  Taken 04/27/2024 1200 by Christophe Sabra SAILOR, RN  Less Restrictive Alternative:   Repositioning   Comfort Measures  Taken 04/27/2024 1000 by Christophe Sabra SAILOR, RN  Less Restrictive Alternative:   Repositioning   Comfort Measures  Taken 04/27/2024 0800 by Wadie Mattie  N, RN  Less Restrictive Alternative:   Repositioning   Comfort Measures  Intervention: Patient Monitoring  Recent Flowsheet Documentation  Taken 04/27/2024 1800 by Christophe Sabra SAILOR, RN  Psychological Status/Visual Check: Subdued  Circulation/Skin Integrity: No signs of injury  Range of Motion: Performed  Fluids: NPO  Food/Meal: Enteral feeding/TPN  Elimination: Rectal tube  Taken 04/27/2024 1600 by Christophe Sabra SAILOR, RN  Psychological Status/Visual Check: Subdued  Circulation/Skin Integrity: No signs of injury  Range of Motion: Performed  Fluids: NPO  Food/Meal: Enteral feeding/TPN  Elimination: Rectal tube  Taken 04/27/2024 1400 by Christophe Sabra SAILOR, RN  Psychological Status/Visual Check: Subdued  Circulation/Skin Integrity: No signs of injury  Range of Motion: Performed  Fluids: NPO  Food/Meal: Enteral feeding/TPN  Elimination: Rectal tube  Taken 04/27/2024 1200 by Christophe Sabra SAILOR, RN  Psychological Status/Visual Check: Subdued  Circulation/Skin Integrity: No signs of injury  Range of Motion: Performed  Fluids: NPO  Food/Meal: Enteral feeding/TPN  Elimination: Rectal tube  Taken 04/27/2024 1000 by Christophe Sabra SAILOR, RN  Psychological Status/Visual Check: Subdued  Circulation/Skin Integrity: No signs of injury  Range of Motion: Performed  Fluids: NPO  Food/Meal: Enteral feeding/TPN  Elimination: Rectal tube  Taken 04/27/2024 0800 by Christophe Sabra SAILOR, RN  Psychological Status/Visual Check: Subdued  Circulation/Skin Integrity: No signs of injury  Range of Motion: Performed  Fluids: NPO  Food/Meal: Enteral feeding/TPN  Elimination: Rectal tube     Problem: Wound  Goal: Absence of Infection Signs and Symptoms  Intervention: Prevent or Manage Infection  Recent Flowsheet Documentation  Taken 04/27/2024 0800 by Christophe Sabra SAILOR, RN  Infection Management: aseptic technique maintained  Goal: Skin Health and Integrity  Intervention: Optimize Skin Protection  Recent Flowsheet Documentation  Taken 04/27/2024 1200 by Christophe Sabra SAILOR, RN  Head of Bed [facility]) Positioning: HOB at 30-45 degrees  Taken 04/27/2024 0800 by Christophe Sabra SAILOR, RN  Pressure Reduction Techniques:   frequent weight shift encouraged   heels elevated off bed   weight shift assistance provided  Head of Bed (HOB) Positioning: HOB at 30-45 degrees  Pressure Reduction Devices:   heel offloading device utilized   positioning supports utilized   pressure-redistributing mattress utilized specialty bed utilized  Skin Protection: adhesive use limited     Problem: Mechanical Ventilation Invasive  Goal: Optimal Device Function  Intervention: Optimize Device Care and Function  Recent Flowsheet Documentation  Taken 04/27/2024 1200 by Christophe Sabra SAILOR, RN  Oral Care:   mouth swabbed   oral rinse provided   suction provided   teeth brushed   tongue brushed  Goal: Absence of Device-Related Skin and Tissue Injury  Intervention: Maintain Skin and Tissue Health  Recent Flowsheet Documentation  Taken 04/27/2024 0800 by Christophe Sabra SAILOR, RN  Device Skin Pressure Protection: absorbent pad utilized/changed  Goal: Absence of Ventilator-Induced Lung Injury  Intervention: Prevent Ventilator-Associated Pneumonia  Recent Flowsheet Documentation  Taken 04/27/2024 1200 by Christophe Sabra SAILOR, RN  Head of Bed [facility]) Positioning: HOB at 30-45 degrees  Oral Care:   mouth swabbed   oral rinse provided   suction provided   teeth brushed   tongue brushed  Taken 04/27/2024 0800 by Christophe Sabra SAILOR, RN  Head of Bed [facility]) Positioning: HOB at 30-45 degrees     Problem: Mechanical Ventilation Invasive  Goal: Optimal Device Function  Intervention: Optimize Device Care and Function  Recent Flowsheet Documentation  Taken 04/27/2024 1200 by Christophe Sabra SAILOR, RN  Oral Care:   mouth swabbed  oral rinse provided   suction provided   teeth brushed   tongue brushed  Goal: Absence of Device-Related Skin and Tissue Injury  Intervention: Maintain Skin and Tissue Health  Recent Flowsheet Documentation  Taken 04/27/2024 0800 by Christophe Sabra SAILOR, RN  Device Skin Pressure Protection: absorbent pad utilized/changed  Goal: Absence of Ventilator-Induced Lung Injury  Intervention: Prevent Ventilator-Associated Pneumonia  Recent Flowsheet Documentation  Taken 04/27/2024 1200 by Christophe Sabra SAILOR, RN  Head of Bed [facility]) Positioning: HOB at 30-45 degrees  Oral Care:   mouth swabbed   oral rinse provided   suction provided   teeth brushed   tongue brushed  Taken 04/27/2024 0800 by Christophe Sabra SAILOR, RN  Head of Bed [facility]) Positioning: HOB at 30-45 degrees     Problem: Mechanical Ventilation Invasive  Goal: Optimal Device Function  Intervention: Optimize Device Care and Function  Recent Flowsheet Documentation  Taken 04/27/2024 1200 by Christophe Sabra SAILOR, RN  Oral Care:   mouth swabbed   oral rinse provided   suction provided   teeth brushed   tongue brushed  Goal: Absence of Device-Related Skin and Tissue Injury  Intervention: Maintain Skin and Tissue Health  Recent Flowsheet Documentation  Taken 04/27/2024 0800 by Christophe Sabra SAILOR, RN  Device Skin Pressure Protection: absorbent pad utilized/changed  Goal: Absence of Ventilator-Induced Lung Injury  Intervention: Prevent Ventilator-Associated Pneumonia  Recent Flowsheet Documentation  Taken 04/27/2024 1200 by Christophe Sabra SAILOR, RN  Head of Bed [facility]) Positioning: HOB at 30-45 degrees  Oral Care:   mouth swabbed   oral rinse provided   suction provided   teeth brushed   tongue brushed  Taken 04/27/2024 0800 by Christophe Sabra SAILOR, RN  Head of Bed [facility]) Positioning: HOB at 30-45 degrees     Problem: Mechanical Ventilation Invasive  Goal: Optimal Device Function  Intervention: Optimize Device Care and Function  Recent Flowsheet Documentation  Taken 04/27/2024 1200 by Christophe Sabra SAILOR, RN  Oral Care:   mouth swabbed   oral rinse provided   suction provided   teeth brushed   tongue brushed  Goal: Absence of Device-Related Skin and Tissue Injury  Intervention: Maintain Skin and Tissue Health  Recent Flowsheet Documentation  Taken 04/27/2024 0800 by Christophe Sabra SAILOR, RN  Device Skin Pressure Protection: absorbent pad utilized/changed  Goal: Absence of Ventilator-Induced Lung Injury  Intervention: Prevent Ventilator-Associated Pneumonia  Recent Flowsheet Documentation  Taken 04/27/2024 1200 by Christophe Sabra SAILOR, RN  Head of Bed [facility]) Positioning: HOB at 30-45 degrees  Oral Care:   mouth swabbed   oral rinse provided   suction provided   teeth brushed   tongue brushed  Taken 04/27/2024 0800 by Christophe Sabra SAILOR, RN  Head of Bed [facility]) Positioning: HOB at 30-45 degrees     Problem: Infection  Goal: Absence of Infection Signs and Symptoms  Intervention: Prevent or Manage Infection  Recent Flowsheet Documentation  Taken 04/27/2024 0800 by Christophe Sabra SAILOR, RN  Infection Management: aseptic technique maintained     Problem: Mechanical Ventilation Invasive  Goal: Optimal Device Function  Intervention: Optimize Device Care and Function  Recent Flowsheet Documentation  Taken 04/27/2024 1200 by Christophe Sabra SAILOR, RN  Oral Care:   mouth swabbed   oral rinse provided   suction provided   teeth brushed   tongue brushed  Goal: Absence of Device-Related Skin and Tissue Injury  Intervention: Maintain Skin and Tissue Health  Recent Flowsheet Documentation  Taken 04/27/2024 0800 by Christophe Sabra SAILOR, RN  Device Skin Pressure Protection: absorbent pad utilized/changed  Goal: Absence of Ventilator-Induced Lung Injury  Intervention: Prevent Ventilator-Associated Pneumonia  Recent Flowsheet Documentation  Taken 04/27/2024 1200 by Christophe Sabra SAILOR, RN  Head of Bed [facility]) Positioning: HOB at 30-45 degrees  Oral Care:   mouth swabbed   oral rinse provided   suction provided   teeth brushed   tongue brushed  Taken 04/27/2024 0800 by Christophe Sabra SAILOR, RN  Head of Bed [facility]) Positioning: HOB at 30-45 degrees     Problem: Mechanical Ventilation Invasive  Goal: Optimal Device Function  Intervention: Optimize Device Care and Function  Recent Flowsheet Documentation  Taken 04/27/2024 1200 by Christophe Sabra SAILOR, RN  Oral Care:   mouth swabbed   oral rinse provided   suction provided   teeth brushed   tongue brushed  Goal: Absence of Device-Related Skin and Tissue Injury  Intervention: Maintain Skin and Tissue Health  Recent Flowsheet Documentation  Taken 04/27/2024 0800 by Christophe Sabra SAILOR, RN  Device Skin Pressure Protection: absorbent pad utilized/changed  Goal: Absence of Ventilator-Induced Lung Injury  Intervention: Prevent Ventilator-Associated Pneumonia  Recent Flowsheet Documentation  Taken 04/27/2024 1200 by Christophe Sabra SAILOR, RN  Head of Bed [facility]) Positioning: HOB at 30-45 degrees  Oral Care:   mouth swabbed   oral rinse provided   suction provided   teeth brushed   tongue brushed  Taken 04/27/2024 0800 by Christophe Sabra SAILOR, RN  Head of Bed [facility]) Positioning: HOB at 30-45 degrees     Problem: Artificial Airway  Goal: Optimal Device Function  Intervention: Optimize Device Care and Function  Recent Flowsheet Documentation  Taken 04/27/2024 1200 by Christophe Sabra SAILOR, RN  Oral Care:   mouth swabbed   oral rinse provided   suction provided   teeth brushed   tongue brushed  Taken 04/27/2024 0800 by Christophe Sabra SAILOR, RN  Aspiration Precautions: awake/alert before oral intake  Goal: Absence of Device-Related Skin or Tissue Injury  Intervention: Maintain Skin and Tissue Health  Recent Flowsheet Documentation  Taken 04/27/2024 0800 by Christophe Sabra SAILOR, RN  Device Skin Pressure Protection: absorbent pad utilized/changed     Problem: Mechanical Ventilation Invasive  Goal: Optimal Device Function  Intervention: Optimize Device Care and Function  Recent Flowsheet Documentation  Taken 04/27/2024 1200 by Christophe Sabra SAILOR, RN  Oral Care:   mouth swabbed   oral rinse provided   suction provided   teeth brushed   tongue brushed  Goal: Absence of Device-Related Skin and Tissue Injury  Intervention: Maintain Skin and Tissue Health  Recent Flowsheet Documentation  Taken 04/27/2024 0800 by Christophe Sabra SAILOR, RN  Device Skin Pressure Protection: absorbent pad utilized/changed  Goal: Absence of Ventilator-Induced Lung Injury  Intervention: Prevent Ventilator-Associated Pneumonia  Recent Flowsheet Documentation  Taken 04/27/2024 1200 by Christophe Sabra SAILOR, RN  Head of Bed [facility]) Positioning: HOB at 30-45 degrees  Oral Care:   mouth swabbed   oral rinse provided   suction provided   teeth brushed   tongue brushed  Taken 04/27/2024 0800 by Christophe Sabra SAILOR, RN  Head of Bed [facility]) Positioning: HOB at 30-45 degrees

## 2024-04-28 LAB — HEPATIC FUNCTION PANEL
ALBUMIN: 2.4 g/dL — ABNORMAL LOW (ref 3.4–5.0)
ALKALINE PHOSPHATASE: 177 U/L — ABNORMAL HIGH (ref 46–116)
ALT (SGPT): 15 U/L (ref 10–49)
AST (SGOT): 30 U/L (ref <=34)
BILIRUBIN DIRECT: 0.6 mg/dL — ABNORMAL HIGH (ref 0.00–0.30)
BILIRUBIN TOTAL: 1.1 mg/dL (ref 0.3–1.2)
PROTEIN TOTAL: 5.7 g/dL (ref 5.7–8.2)

## 2024-04-28 LAB — CBC W/ AUTO DIFF
BASOPHILS ABSOLUTE COUNT: 0.1 10*9/L (ref 0.0–0.1)
BASOPHILS RELATIVE PERCENT: 0.3 %
EOSINOPHILS ABSOLUTE COUNT: 0.1 10*9/L (ref 0.0–0.5)
EOSINOPHILS RELATIVE PERCENT: 0.4 %
HEMATOCRIT: 26.5 % — ABNORMAL LOW (ref 34.0–44.0)
HEMOGLOBIN: 8.7 g/dL — ABNORMAL LOW (ref 11.3–14.9)
LYMPHOCYTES ABSOLUTE COUNT: 1.9 10*9/L (ref 1.1–3.6)
LYMPHOCYTES RELATIVE PERCENT: 11 %
MEAN CORPUSCULAR HEMOGLOBIN CONC: 32.7 g/dL (ref 32.0–36.0)
MEAN CORPUSCULAR HEMOGLOBIN: 31.1 pg (ref 25.9–32.4)
MEAN CORPUSCULAR VOLUME: 95.1 fL (ref 77.6–95.7)
MEAN PLATELET VOLUME: 9.3 fL (ref 6.8–10.7)
MONOCYTES ABSOLUTE COUNT: 1.1 10*9/L — ABNORMAL HIGH (ref 0.3–0.8)
MONOCYTES RELATIVE PERCENT: 6.8 %
NEUTROPHILS ABSOLUTE COUNT: 13.8 10*9/L — ABNORMAL HIGH (ref 1.8–7.8)
NEUTROPHILS RELATIVE PERCENT: 81.5 %
PLATELET COUNT: 171 10*9/L (ref 150–450)
RED BLOOD CELL COUNT: 2.79 10*12/L — ABNORMAL LOW (ref 3.95–5.13)
RED CELL DISTRIBUTION WIDTH: 18.5 % — ABNORMAL HIGH (ref 12.2–15.2)
WBC ADJUSTED: 16.9 10*9/L — ABNORMAL HIGH (ref 3.6–11.2)

## 2024-04-28 LAB — BASIC METABOLIC PANEL
ANION GAP: 9 mmol/L (ref 5–14)
BLOOD UREA NITROGEN: 21 mg/dL (ref 9–23)
BUN / CREAT RATIO: 42
CALCIUM: 8.5 mg/dL — ABNORMAL LOW (ref 8.7–10.4)
CHLORIDE: 104 mmol/L (ref 98–107)
CO2: 24 mmol/L (ref 20.0–31.0)
CREATININE: 0.5 mg/dL — ABNORMAL LOW (ref 0.55–1.02)
EGFR CKD-EPI (2021) FEMALE: >90 mL/min/1.73m2 (ref >=60)
GLUCOSE RANDOM: 164 mg/dL (ref 70–179)
POTASSIUM: 4.4 mmol/L (ref 3.4–4.8)
SODIUM: 137 mmol/L (ref 135–145)

## 2024-04-28 LAB — CYSTATIN C
CYSTATIN C: 1.7 mg/L — ABNORMAL HIGH (ref 0.64–1.23)
EGFR CKD-EPI (2012) CYSTATIN C FEMALE: 33 mL/min/1.73m2 — ABNORMAL LOW (ref >=60)

## 2024-04-28 LAB — BLOOD GAS, VENOUS
BASE EXCESS VENOUS: -0.1 (ref -2.0–2.0)
BASE EXCESS VENOUS: -0.1 (ref -2.0–2.0)
CARBOXYHEMOGLOBIN, VENOUS: 1.8 % — ABNORMAL HIGH (ref <1.2)
CARBOXYHEMOGLOBIN, VENOUS: 1.9 % — ABNORMAL HIGH (ref <1.2)
HCO3 VENOUS: 24 mmol/L (ref 22–27)
HCO3 VENOUS: 24 mmol/L (ref 22–27)
METHEMOGLOBIN, VENOUS: <1 % (ref <1.5)
METHEMOGLOBIN, VENOUS: <1 % (ref <1.5)
O2 SATURATION VENOUS: 75.1 % (ref 40.0–85.0)
O2 SATURATION VENOUS: 66 % (ref 40.0–85.0)
OXYHEMOGLOBIN, VENOUS: 73.4 % (ref 40.0–85.0)
OXYHEMOGLOBIN, VENOUS: 64.2 % (ref 40.0–85.0)
PCO2 VENOUS: 38 mmHg — ABNORMAL LOW (ref 40–60)
PCO2 VENOUS: 37 mmHg — ABNORMAL LOW (ref 40–60)
PH VENOUS: 7.42 (ref 7.32–7.43)
PH VENOUS: 7.42 (ref 7.32–7.43)
PO2 VENOUS: 41 mmHg (ref 30–55)
PO2 VENOUS: 38 mmHg (ref 30–55)

## 2024-04-28 LAB — MAGNESIUM: MAGNESIUM: 2.1 mg/dL (ref 1.6–2.6)

## 2024-04-28 LAB — PHOSPHORUS: PHOSPHORUS: 2.8 mg/dL (ref 2.4–5.1)

## 2024-04-28 MED ADMIN — sodium chloride (NS) 0.9 % flush 10 mL: 10 mL | INTRAVENOUS | @ 09:00:00

## 2024-04-28 MED ADMIN — sodium chloride (NS) 0.9 % flush 10 mL: 10 mL | INTRAVENOUS | @ 15:00:00

## 2024-04-28 MED ADMIN — sodium chloride (NS) 0.9 % flush 10 mL: 10 mL | INTRAVENOUS | @ 23:00:00

## 2024-04-28 MED ADMIN — rifAXIMin (XIFAXAN) oral suspension: 550 mg | GASTROENTERAL | @ 14:00:00 | Stop: 2025-04-11

## 2024-04-28 MED ADMIN — rifAXIMin (XIFAXAN) oral suspension: 550 mg | GASTROENTERAL | @ 01:00:00 | Stop: 2025-04-11

## 2024-04-28 MED ADMIN — lactulose oral solution: 30 g | GASTROENTERAL | @ 14:00:00

## 2024-04-28 MED ADMIN — folic acid (FOLVITE) tablet 1 mg: 1 mg | GASTROENTERAL | @ 14:00:00

## 2024-04-28 MED ADMIN — metoPROLOL tartrate (Lopressor) tablet 25 mg: 25 mg | GASTROENTERAL | @ 14:00:00

## 2024-04-28 MED ADMIN — metoPROLOL tartrate (Lopressor) tablet 25 mg: 25 mg | GASTROENTERAL | @ 01:00:00

## 2024-04-28 MED ADMIN — thiamine mononitrate (vit B1) tablet 100 mg: 100 mg | GASTROENTERAL | @ 14:00:00

## 2024-04-28 MED ADMIN — ergocalciferol (DRISDOL) oral drops 200 mcg/mL (8,000 unit/mL): 100 ug | GASTROENTERAL | @ 14:00:00 | Stop: 2024-05-21

## 2024-04-28 MED ADMIN — cyanocobalamin (vitamin B-12) tablet 1,000 mcg: 1000 ug | GASTROENTERAL | @ 14:00:00

## 2024-04-28 MED ADMIN — levETIRAcetam (KEPPRA) tablet 750 mg: 750 mg | GASTROENTERAL | @ 14:00:00

## 2024-04-28 MED ADMIN — levETIRAcetam (KEPPRA) tablet 750 mg: 750 mg | GASTROENTERAL | @ 01:00:00

## 2024-04-28 MED ADMIN — famotidine (PEPCID) tablet 20 mg: 20 mg | GASTROENTERAL | @ 14:00:00

## 2024-04-28 MED ADMIN — melatonin tablet 3 mg: 3 mg | GASTROENTERAL | @ 22:00:00

## 2024-04-28 MED ADMIN — apixaban (ELIQUIS) tablet 5 mg: 5 mg | GASTROENTERAL | @ 14:00:00

## 2024-04-28 MED ADMIN — apixaban (ELIQUIS) tablet 5 mg: 5 mg | GASTROENTERAL | @ 01:00:00

## 2024-04-28 MED ADMIN — cefepime (MAXIPIME) 2 g in sodium chloride 0.9 % (NS) 100 mL IVPB-MBP: 2 g | INTRAVENOUS | @ 21:00:00 | Stop: 2024-05-03

## 2024-04-28 MED ADMIN — zinc acetate oral solution: 50 mg | GASTROENTERAL | @ 22:00:00 | Stop: 2024-05-06

## 2024-04-28 MED ADMIN — esomeprazole (NEXIUM) granules 40 mg: 40 mg | GASTROENTERAL | @ 14:00:00

## 2024-04-28 MED ADMIN — multivitamins, therapeutic with minerals tablet 1 tablet: 1 | GASTROENTERAL | @ 14:00:00

## 2024-04-28 MED ADMIN — lacosamide (VIMPAT) tablet 100 mg: 100 mg | GASTROENTERAL | @ 14:00:00

## 2024-04-28 MED ADMIN — lacosamide (VIMPAT) tablet 100 mg: 100 mg | GASTROENTERAL | @ 01:00:00

## 2024-04-28 MED ADMIN — lactated ringers bolus 500 mL: 500 mL | INTRAVENOUS | @ 16:00:00 | Stop: 2024-04-28

## 2024-04-28 MED ADMIN — lactated ringers bolus 500 mL: 500 mL | INTRAVENOUS | @ 21:00:00 | Stop: 2024-04-28

## 2024-04-28 MED ADMIN — vancomycin (VANCOCIN) IVPB 1000 mg (premix): 1000 mg | INTRAVENOUS | @ 22:00:00 | Stop: 2024-05-03

## 2024-04-28 NOTE — Plan of Care (Signed)
 Problem: Mechanical Ventilation Invasive  Goal: Mechanical Ventilation Liberation  Outcome: Progressing     Problem: Breathing Pattern Ineffective  Goal: Effective Breathing Pattern  Outcome: Ongoing - Unchanged  Intervention: Promote Improved Breathing Pattern  Recent Flowsheet Documentation  Taken 04/28/2024 1439 by Sonna Lonni SAUNDERS, RRT  Head of Bed [facility]) Positioning: HOB at 30-45 degrees  Taken 04/28/2024 0930 by Sonna Lonni SAUNDERS, RRT  Head of Bed [facility]) Positioning: HOB at 30-45 degrees     Problem: Gas Exchange Impaired  Goal: Optimal Gas Exchange  Outcome: Ongoing - Unchanged  Intervention: Optimize Oxygenation and Ventilation  Recent Flowsheet Documentation  Taken 04/28/2024 1439 by Sonna Lonni SAUNDERS, RRT  Head of Bed [facility]) Positioning: HOB at 30-45 degrees  Taken 04/28/2024 0930 by Sonna Lonni SAUNDERS, RRT  Head of Bed (HOB) Positioning: HOB at 30-45 degrees     Problem: Mechanical Ventilation Invasive  Goal: Effective Communication  Outcome: Ongoing - Unchanged  Goal: Optimal Device Function  Outcome: Ongoing - Unchanged  Intervention: Optimize Device Care and Function  Recent Flowsheet Documentation  Taken 04/28/2024 0930 by Sonna Lonni SAUNDERS, RRT  Oral Care:   mouth swabbed   oral rinse provided   suction provided   teeth brushed   tongue brushed  Goal: Optimal Nutrition Delivery  Outcome: Ongoing - Unchanged  Goal: Absence of Device-Related Skin and Tissue Injury  Outcome: Ongoing - Unchanged  Goal: Absence of Ventilator-Induced Lung Injury  Outcome: Ongoing - Unchanged  Intervention: Prevent Ventilator-Associated Pneumonia  Recent Flowsheet Documentation  Taken 04/28/2024 1439 by Sonna Lonni SAUNDERS, RRT  Head of Bed [facility]) Positioning: HOB at 30-45 degrees  Taken 04/28/2024 0930 by Sonna Lonni SAUNDERS, RRT  Head of Bed (HOB) Positioning: HOB at 30-45 degrees  Oral Care:   mouth swabbed   oral rinse provided   suction provided   teeth brushed   tongue brushed     Problem: Artificial Airway  Goal: Effective Communication  Outcome: Ongoing - Unchanged  Goal: Optimal Device Function  Outcome: Ongoing - Unchanged  Intervention: Optimize Device Care and Function  Recent Flowsheet Documentation  Taken 04/28/2024 0930 by Sonna Lonni SAUNDERS, RRT  Oral Care:   mouth swabbed   oral rinse provided   suction provided   teeth brushed   tongue brushed  Goal: Absence of Device-Related Skin or Tissue Injury  Outcome: Ongoing - Unchanged

## 2024-04-28 NOTE — Plan of Care (Signed)
 Shift Summary  Lactated ringers  was administered twice during the shift, supporting fluid management.    Minimal urine output, MDI team aware.  Venous blood gas was drawn in the afternoon, providing additional data for respiratory and metabolic status assessment.    Cefepime  and vancomycin  were administered late in the shift, addressing infection management.    Overall, comfort and ventilator tolerance were maintained, and oxygenation remained stable throughout the shift.      Optimal Comfort and Wellbeing: CPOT scores remained at 0 throughout the shift, with relaxed facial expression and muscle tension, and absence of abnormal body movements, suggesting comfort was maintained.    Effective Breathing Pattern: Respiratory rate fluctuated but remained within a manageable range, and respiratory pattern alternated between ventilator controlled and regular/unlabored; airway and oral suctioning were performed as needed.    Optimal Gas Exchange: SpO2 levels stayed between 96-100% and FiO2 was gradually reduced from 30% to 25% during the shift, with blood gas values available for further assessment.    Mechanical Ventilation Liberation: Ventilator mode transitioned from PRVC to PSV-CPAP, with consistent pressure support and PEEP settings; patient was not extubated during the shift.    Mechanical Ventilation Liberation: Ventilator status remained on, and backup pressure control was stable; patient tolerated ventilator well per CPOT compliance documentation.

## 2024-04-28 NOTE — Progress Notes (Signed)
 MICU Daily Progress Note     Date of Service: 04/28/2024    Problem List:   Principal Problem:    Bacteremia, escherichia coli  Active Problems:    Alcoholic liver disease (HHS-HCC)    HTN (hypertension)    Depression with anxiety    Class 2 severe obesity with serious comorbidity in adult    Atrial fibrillation    (CMS-HCC)    Pleural effusion    Hypernatremia    Thrombocytopenia    Cirrhosis    (CMS-HCC)    A-fib (CMS-HCC)    Hepatic encephalopathy    (CMS-HCC)    Anaphylaxis      Interval history: [name] is a 78 y.o. female with HFpEF, HTN, Afib on AC, MASLD cirrhosis, originally hospitalized for scheduled TEE/DCCV (aborted d/t LAA clot) c/b AHHRF, encephalopathy and septic shock requiring MICU care, was s/p extubation transferred to [facility] for further management of anticoagulation and hypernatremia. Transferred back to [facility] MICU for closer monitoring for respiratory status, pressor requirement, GI bleed, and management of carotid artery injury.     On 10/24, patient had acute worsening of respiratory status requiring intubation. When patient was given sedation for intubation, she coded and promptly had ROSC after a few minutes of CPR. Subsequently, CVC placed in neck, used to draw blood, unclear if given pressors through it, then found  to be in arterial system based on blood gas and imaging, likely right carotid. Line was subsequently removed, and pressure was held for 15 minutes, hemostasis with no hematoma. She was transferred to [facility] MICU for management of this carotid injury, to be evaluated by vascular surgery while also managing her increased vasopressor requirement and respiratory distress.  Has been weaned off pressors since 10/27 at 1 PM. VBG's indicating that she is ready for extubation, however her mentation suggests that she is not able to protect her airway.  She was extubated on 10/31, and mentation slowly improved.  Unfortunately on the morning of 11/2 she had increased lethargy, increased secretions, and she was not protecting her airway.  She was reintubated on 11/2, and required pressor support with norepinephrine  and vasopressin  after the induction agents.  Self extubated on 11/5. Re intubated on 11/6 due to hypercarbia and increased work of breathing.  We are left with the challenging options regarding the care of [name].  Due to her multiple failed extubations, we are concerned [name] will need a tracheostomy long-term to protect her airway.  There is a meeting with palliative care and family on 04/13/2024, her family agreed for tracheostomy.  Tracheostomy placed 11/14.  Possible anaphylactic reaction to rocuronium  was noted in anesthesia note. On 11/16, patient started exhibiting altered mental status as she became hard to arouse and stopped following commands.  Sepsis workup was initiated.  She also had a lateral gaze on exam, so neurology was consulted.  EEG showed seizure activity and MRI brain showed diffuse cortical restricted diffusion greatest within the bilateral parietal -occipital regions.  Her ammonia level was also elevated to 291.  It was unclear if seizures were related to her ammonia level.  Patient placed on Keppra  and Vimpat  and has continued with them. She has continued on lactulose  with minimal improvement overall in terms of mental status even though she is stooling at goal. As of 11/27, she is on PRVC, and has been unable to deescalate and has failed trials of HFNC.     Family meeting tentatively planned for Wednesday (12/3) to discuss goals of  care.  Palliative is involved.    24 hour events:  - no change to Neuro exam, on pressure support  - Per RT, she is ready to move to step down  - PM lactulose  held 11/26  - Oliguria: Urine Na <10, giving 500 mL LR and reassessing in PM  - FMS is well-maintained (10 days)  - PPI, DVT: Home Eliquis  5 BID  - Goal stool output to 500 mL - 1L daily  - Continue lactulose  TID    Neurological     Hepatic encephalopathy - Non-Convulsive Seizures   Rapid response called on 10/24 at [facility] for somnolence. Ddx  hepatic encephalopathy, toxic metabolic encephalopathy in the setting of shock (possibly sepsis), intracranial pathology. Intubated.  No acute abnormality on 10/24 head CT. Failed extubation attempts and tracheostomy placed on 11/14. Overnight on 04/18/2024, patient noted to have eye deviation along with decreased responsiveness on physical exam.   Head CT reassuring.  MRI brain with diffuse cortical restricted diffusion greatest within the bilateral parieto-occipital regions, unclear etiology. 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 Neurology consulted.  EEG with evidence of seizure activity, therefore started on Keppra  and vimpat . Ammonia significantly elevated. Clinical status changed in the setting of missed lactulose  doses (due to copious stool output) - suspect possible hyperammonemia leading to seizures. Some improvement of mental status with resuming lactulose  and adequate stool output.   - Neurology following, appreciate recommendations  - Plan to obtain MRI brain in 2 weeks (~12/4)   - Continue Lactulose  30g TID  - Rifaximin  550 mg twice daily  - Continue Keppra  750 mg twice daily (renally dosed)  - Continue Vimpat  100 mg twice daily  - Continue to monitor mental status  - Goals of care meeting next week with family, palliative on board   - Back on EEG per neurology    Analgesia: No pain issues  RASS at goal? N/A, not on sedation  [place] Agitation Assessment Scale (RASS) : -1 (04/28/2024  6:00 AM)      Pulmonary       Acute Hypoxic Hypercarbic Respiratory Failure   Rapid response called on 10/24 for increased somnolence, found to have acute hypercarbic respiratory failure and subsequently intubated. Suspect hypercarbic respiratory failure due to encephalopathy as above. CXR on 10/24 showed worsening pleural effusions. Given patient's hypoxia, and AMS, there was concern for infectious etiology.  Infectious workup overall negative. Unable to protect her airway due to her current mentation.  Reintubated on 11/2 followed by therapeutic bronchoscopy.  Self extubated on 11/5.  Reintubate noted on 11/6 due to hypercarbia and not protecting her airway. Trach placed 11/14, complicated by anaphylactic reaction to rocuronium  which was treated with epi, benadryl  and steroids. Allergy added to chart.  First trach change completed on 04/20/2024 by ENT.  - Continue mechanical ventilation, weaning as tolerated  - Trial of high flow trach collar as tolerated. Has not tolerated high flow trach collar, becomes tachypneic. No trial 11/27.  - VBG q12  - Gases stable 11/27      Vent Mode: PRVC  FiO2 (%): 30 %  S RR: 12  S VT: 330 mL  PEEP: 5 cm H20  PR SUP: 14 cm H20    Cardiovascular   Carotid Artery Injury (resolved)  At [facility], CVC placed intended for RIJ on 10/24 following intubation, PEA, ROSC. Was used with known blood return. Unclear if it was used for pressor support, but highly likely that it was. CXR for placement check showed concern  for intra aterial location, and blood gas confirmed it was arterial. Thought to be in carotid artery. CVC removed with pressure held for 15 minutes, with no hematoma formation. On arrival, no physical exam and bedside ultrasound showed no large hematoma. No active bleeding.  Vascular Surgery consulted, stated fistula has healed based on Ultrasound findings.  - CTM    PEA arrest s/p ROSC  Hypovolemic/Hemorrhagic shock (resolved)  Atrial fibrillation  known left atrial appendage clot  HFpEF  Patient with PEA arrest peri-intubation with ROSC. Shock thought to be hemorrhagic in the setting of GI bleed History of atrial fibrillation with known LAA clot and HFpEF. Vasopressors were weaned off 10/27. APTT was elevated the morning of 10/28 necessitating changing anticoagulation from therapeutic heparin  to therapeutic Lovenox  .   - Metoprolol  25 mg twice daily    Prolonged QTC  EKG 11/25 w/QTC of 605. Does not appear to be on Qtc prolonging medications and has not had changes to her regimen that point to reason behind this change. Of note, this EKG was of poor quality.   - Repeat EKG ordered, pending      Renal     Abnormal Electrolytes - Hypernatremia (resolved) - Hypokalemia (resolved) - Hypomagnesemia (resolved)  At [facility], had persistently hypernatremia near 153. Had been treated with D5 gtt. Sodium throughout admission has largely been 146-148. Sodium has overall normalized and continues to be within normal limits.  - Strict I/O's  - BMP daily  - Magnesium  and potassium repleted 11/24  - Good urine output  - Stable 11/27    Oliguria - Pre-renal AKI  On 11/26, patient produced 200 mL urine. Bladder scan showed 60 mL retained fluid. On physical exam, has coarse breath sounds bilaterally with high pitting lower extremity edema. Uncertain if she is dry or fluid overloaded at this time. Will test as below.   - Urine Na <10, likely dry and retaining fluids.  - Consider POCUS IVC if not sufficient output  - 500 ml Bolus LR 11/27. Plan to give additional fluid if well-tolerated.     Infectious Disease/Autoimmune   E. Coli Bacteremia (resolved)  Initially admitted for afib, found to have E. coli bacteremia on October 10. This was treated with cefazolin . Rapid response called 10/24, patient noted to be hypothermic. WBCs jumped from 3.0 to 9.7 on 10/24. Although the most recent lab was following intubation and CPR. UA with pyuria, rare bacteria, hematuria. Peri-intubation, patient developed significant hypotension requiring initiation of NE and vasopressin . Suspect possible septic shock with unknown infectious source. Patient started on vancomycin  and cefepime  10/24. CT Abdomen pelvis concerning for aspiration pneumonia. Following infectious workup was unremarkable. Antibiotics were stopped on 10/27.  Infectious workup was repeated on 11/3 due to reintubation and status post bronchoscopy.  Completed course of ceftriaxone .  - CTM    Cultures:  Blood Culture, Routine (no units)   Date Value   04/17/2024 No Growth at 5 days   04/17/2024 No Growth at 5 days     Urine Culture, Comprehensive (no units)   Date Value   04/17/2024 NO GROWTH   04/09/2024 NO GROWTH     Lower Respiratory Culture (no units)   Date Value   04/10/2024 2+ Methicillin-Susceptible Staphylococcus aureus (A)   04/10/2024 1+ Oropharyngeal Flora Isolated     WBC (10*9/L)   Date Value   04/28/2024 16.9 (H)     WBC, UA (/HPF)   Date Value   04/17/2024 1       FEN/GI  Sigmoid Mass c/f Malignancy and Hematochezia (resolved)  Hemorrhagic Shock (resolved)  CT abdomen pelvis done 10/24 with evidence of cirrhosis and masslike thickening of the lower sigmoid colon concerning for malignancy.  CTA abdomen pelvis performed 1024 with active extravasation into the gastric fundus possibly secondary to traumatic enteric tube placement.  GI scoped the patient on 10/25 and clipped an area of active bleeding.  They removed a total of 1.5 L of blood.  Hemoglobin 10/20 6 AM downtrended to 6.0, so 2 units of blood transfused.  GI stated this was consistent with 10/25 scope and likely did not represent increased bleeding. Hb steadily improving with Hb of 9.2 on 10/28.   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 Given shock of unclear etiology. CTAP demonstrating cirrhosis. Likely that cirrhosis was contributing to hypotension.  Worsening altered mental status with seizure-like activity in the setting of hyperammonemia after missing 2-3 lactulose  doses due to copious stool output.  Evidence of seizure activity on EEG.  Mental status with slow improvement once lactulose  was resumed.  - Continue lactulose  as above  - Goal stool output 500 mL - 1L daily  - FMS in place  - MELD labs and CMP    Vitamin Deficiencies  Low vitamin D , copper , and zinc .  - Appreciate dietitian recommendations  - Continue tube feeds (currently at goal)  - Zinc  repletion  - Multivitamin 1 tablet daily  - Folic acid  1 mg daily  - Copper  2 mg IV 11/24  - Vitamin B12 1000 mcg daily  - Repeat Serum copper  pending     MELD 3.0: 15 at 04/05/2024  4:31 AM  MELD-Na: 13 at 04/05/2024  4:31 AM  Calculated from:  Serum Creatinine: 0.43 mg/dL (Using min of 1 mg/dL) at 88/09/7972  5:68 AM  Serum Sodium: 143 mmol/L (Using max of 137 mmol/L) at 04/05/2024  4:31 AM  Total Bilirubin: 1.3 mg/dL at 88/09/7972  5:68 AM  Serum Albumin : 2.4 g/dL at 88/09/7972  5:68 AM  INR(ratio): 1.61 at 04/03/2024 10:26 AM  Age at listing (hypothetical): 78 years  Sex: Female at 04/05/2024  4:31 AM    Provider Malnutrition Assessment:  Body mass index is 41.76 kg/m??. BMI Interpretation: >/= 30 and < 40, consistent with obesity, clinically significant requiring additional resources and complicating multiple aspects of patient care.  GLIM criteria:   Pt does not meet criteria  -I have screened this patient for malnutrition and they did NOT meet criteria for malnutrition based on GLIM criteria.  -TF and FWF; Dietician consulted, appreciate assistance      Heme/Coag     Leukocytosis  Acute rise in WBC from 8.8 to 16.9 on 11/27. Could be 2/2 infection though no overt signs of infection at this time as patients vitals and other labwork have remained stable. No other major changes in plan that could cause this.   - CTM and reassess 11/28    Acute blood loss anemia (Stable)  Hemoglobin down trended to 6.0 on the morning of 10/26 thought to be secondary to acute GI bleed that was scoped and clipped 10/25. Hemoglobin remains stable.   - CBC daily  - Maintain active T&S    Endocrine   History of R HR+/HER2 low (2+) IDC Breast Cancer s/p Partial Mastectomy and Radiation (  2022)   - home anastrozole     Integumentary     RLE Shin Wound  - WOCN consulted 11/26, appreciate assistance    Prophylaxis/LDA/Restraints/Consults   ICU Checklist completed: yes (see ICU rounding navigator in Epic)    Patient Lines/Drains/Airways Status       Active Active Lines, Drains, & Airways       Name Placement date Placement time Site Days    Tracheostomy Shiley 6 04/20/24  1430  6  7    NG/OG Tube Feedings 10 Fr. Right nostril 04/01/24  1138  Right nostril  26    External Urinary Device 04/13/24 With Suction 04/13/24  1700  -- 14    PICC Double Lumen 04/06/24 Left Basilic 04/06/24  1558  Basilic  21    Peripheral IV 88/79/74 Right Wrist 04/21/24  0830  Wrist  6                  Patient Lines/Drains/Airways Status       Active Wounds       Name Placement date Placement time Site Days    Wound 03/13/24 Irritant Contact Dermatitis Incontinence Sacrum Mid gluteal cleft MASD? 03/13/24  --  Sacrum  46    Wound 04/27/24 Pretibial Right 04/27/24  1500  Pretibial  less than 1                  Goals of Care     Code Status:   Orders Placed This Encounter   Procedures    Full Code     Standing Status:   Standing     Number of Occurrences:   1        Designated Healthcare Decision Maker:  Ms. Paxson designated healthcare decision maker(s) is/are   HCDM (patient stated preference): Laker,[name] - Spouse - [phone]    HCDM (patient stated preference): Karn,[name] - Daughter - [phone]. See HCDM section of Epic sidebar/storyboard or ACP tab in patient chart for details regarding active HCDMs and patient capacity for decision-making.      Subjective     Intubated.  Withdraws to pain. Opens eyes to voice. Opens eyes to stimulation, minimally interactive.  Unable to reliably follow commands.    Objective     Vitals - past 24 hours  Temp:  [36.8 ??C (98.2 ??F)] 36.8 ??C (98.2 ??F)  Pulse:  [85-113] 100  SpO2 Pulse:  [81-116] 102  Resp:  [13-39] 21  BP: (100-135)/(37-79) 107/67  FiO2 (%):  [30 %] 30 %  SpO2:  [96 %-100 %] 96 % Intake/Output  I/O last 3 completed shifts:  In: 3610 [P.O.:120; NG/GT:3490]  Out: 2575 [Urine:1250; Stool:1325]     Physical Exam:   General: Intubated. Left hand in mitts. R leg restraint in place.   HEENT: normocephalic, atraumatic.  Trach in place.   CV: Regular rate and rhythm, no m/r/g  Pulm: Coarse breath sounds bilaterally anteriorly  GI: soft, does not appear tender; non-distended. + BS  MSK: 3+ pitting Bilateral LE edema (legs to feet); arms appear swollen, hips difficult to assess. No noted weeping in left extremity. Wound on RLE now with bandage wrapped around it. Shiny, smooth skin in bilateral upper and lower extremities.   Skin: Moist skin breakdown around trach.  Neuro: Opens eyes to voice, does not follow commands, minimally moves upper extremities. Withdraws from noxious stimuli. Does not track interviewer.     Continuous Infusions:   Infusions Meds[1]    Scheduled Medications:   Scheduled  Medications[2]    PRN medications:  PRN Medications[3]    Data/Imaging Review: Reviewed in Epic and personally interpreted on 04/28/2024. See EMR for detailed results.    Lacinda Rimes, MD  PGY-1, [facility] Internal Medicine             [1] [2]    apixaban   5 mg Enteral tube: gastric BID    cyanocobalamin  (vitamin B-12)  1,000 mcg Enteral tube: gastric Daily    ergocalciferol   100 mcg Enteral tube: gastric Daily    esomeprazole   40 mg Enteral tube: gastric Daily    famotidine   20 mg Enteral tube: gastric Daily    flu vac 2025 [id number](PF)  0.5 mL Intramuscular During hospitalization    folic acid   1 mg Enteral tube: gastric Daily    lacosamide   100 mg Enteral tube: gastric BID    lactulose   30 g Enteral tube: gastric TID    levETIRAcetam   750 mg Enteral tube: gastric BID    melatonin  3 mg Enteral tube: gastric QPM    metoPROLOL  tartrate  25 mg Enteral tube: gastric BID    multivitamins (ADULT)  1 tablet Enteral tube: gastric Daily    rifAXIMin   550 mg Enteral tube: gastric BID    sodium chloride   10 mL Intravenous Q8H    sodium chloride   10 mL Intravenous Q8H    thiamine  mononitrate (vit B1)  100 mg Enteral tube: gastric Daily    zinc  acetate  50 mg elem zinc  Enteral tube: gastric Daily   [3] acetaminophen 

## 2024-04-28 NOTE — Consults (Signed)
 Vancomycin  Therapeutic Monitoring Pharmacy Note    [name] is a 78 y.o. female starting vancomycin . Date of therapy initiation: 11/27    Indication: Bacteremia/Sepsis    Prior Dosing Information: Previous regimen 1000 mg q24hr     Goals:  Therapeutic Drug Levels  Vancomycin  trough goal: 10-15 mg/L    Additional Clinical Monitoring/Outcomes  Renal function, volume status (intake and output)    Results: Not applicable    Wt Readings from Last 1 Encounters:   04/24/24 (!) 110.4 kg (243 lb 6.2 oz)     Creatinine   Date Value Ref Range Status   04/28/2024 0.50 (L) 0.55 - 1.02 mg/dL Final   88/73/7974 9.58 (L) 0.55 - 1.02 mg/dL Final   88/74/7974 9.58 (L) 0.55 - 1.02 mg/dL Final        Pharmacokinetic Considerations and Significant Drug Interactions:  Adult (calculated on 10/26): Vd = 63 L, ke = 0.032 hr-1  Concurrent nephrotoxic meds: not applicable    Assessment/Plan:  Recommendation(s)  Start vancomycin  1000 mg q24 hr  Patient was recently on vancomycin  from 10/24-27 and was supratherapeutic on 750 mg q12hr (trough 23.3 mg/L on 10/26). Based on previous kinetics, will start her on previous regimen of 1000 mg q24hr.  Estimated trough on recommended regimen: 12 mg/L  Will plan to draw random level with AM labs tomorrow to assess clearance    Follow-up  Level due: in 3-5 days  A pharmacist will continue to monitor and order levels as appropriate    Please page service pharmacist with questions/clarifications.    Duwaine JAYSON Mungo, PharmD  PGY1 Ambulatory Care Pharmacy Resident

## 2024-04-28 NOTE — Consults (Signed)
 WOCN Consult Services                                                 Wound Evaluation  Reason for Consult:   - Lower Extremity Ulcer  - Initial    Problem List:   Principal Problem:    Bacteremia, escherichia coli  Active Problems:    Alcoholic liver disease (HHS-HCC)    HTN (hypertension)    Depression with anxiety    Class 2 severe obesity with serious comorbidity in adult    Atrial fibrillation    (CMS-HCC)    Pleural effusion    Hypernatremia    Thrombocytopenia    Cirrhosis    (CMS-HCC)    A-fib (CMS-HCC)    Hepatic encephalopathy    (CMS-HCC)    Anaphylaxis    Assessment: Consulted by nursing for wound noted on right pre-tibia. Per EMR, patient admitted on 03/10/24 with HFpEF, HTN, Afib on AC, MASLD cirrhosis, originally hospitalized for scheduled TEE/DCCV (aborted d/t LAA clot) c/b AHHRF, encephalopathy and septic shock requiring MICU care, was s/p extubation transferred to [facility] for further management of anticoagulation and hypernatremia. Transferred back to [facility] MICU for closer monitoring for respiratory status, pressor requirement, GI bleed, and management of carotid artery injury.      On 10/24, patient had acute worsening of respiratory status requiring intubation. When patient was given sedation for intubation, she coded and promptly had ROSC after a few minutes of CPR. Subsequently, CVC placed in neck, used to draw blood, unclear if given pressors through it, then found  to be in arterial system based on blood gas and imaging, likely right carotid. Line was subsequently removed, and pressure was held for 15 minutes, hemostasis with no hematoma. She was transferred to [facility] MICU for management of this carotid injury, to be evaluated by vascular surgery while also managing her increased vasopressor requirement and respiratory distress.  Has been weaned off pressors since 10/27 at 1 PM. VBG's indicating that she is ready for extubation, however her mentation suggests that she is not able to protect her airway.  She was extubated on 10/31, and mentation slowly improved.  Unfortunately on the morning of 11/2 she had increased lethargy, increased secretions, and she was not protecting her airway.  She was reintubated on 11/2, and required pressor support with norepinephrine  and vasopressin  after the induction agents.  Self extubated on 11/5. Re intubated on 11/6 due to hypercarbia and increased work of breathing.  We are left with the challenging options regarding the care of [name].  Due to her multiple failed extubations, we are concerned [name] will need a tracheostomy long-term to protect her airway.  There is a meeting with palliative care and family on 04/13/2024, her family agreed for tracheostomy.  Tracheostomy placed 11/14.  On 11/16, patient started presenting with altered mental status as she became hard to arouse and stopped following commands.  Sepsis workup was initiated.  She also had a lateral gaze on exam, so neurology was consulted.  EEG showed seizure activity and MRI brain showed diffuse cortical restricted diffusion greatest within the bilateral parietal -occipital regions.  Her ammonia level is also elevated to 291.  It is unclear if seizures are related to her ammonia level.  Patient placed on Keppra  and Vimpat .     Plans for family meeting next week (  12/1) to discuss goals of care.  Palliative is involved.  Patient assessed in MICU. She is alone. Focused assessment to bilateral lower extremities. Heels are intact. Both lower extremities are edematous. Wound noted on right lower pretibia with large amount of serous drainage. Cleansed with vashe and absorptive dressing applied with hydrofiber with silver and foam, secured with kerlix wrap. Heels are elevated off bed surface with flo locks.   Primary RN updated on plan of care.     Wound 04/27/24 Traumatic Abrasion Pretibial Right (Active)   Properties   Placement Date 04/27/24 Placement Time 1500   Location Pretibial   Present on Original Admission N   Primary Wound Type Traumatic   Secondary Wound Type - Traumatic Abrasion   Wound Location Orientation Right      Assessments 04/28/2024  8:41 AM   Wound Image     Dressing Status      No dressing   Wound Length (cm) 2 cm   Wound Width (cm) 2 cm   Wound Depth (cm) 0.1 cm   Wound Surface Area (cm^2) 3.14 cm^2   Wound Volume (cm^3) 0.209 cm^3   Peri-wound Assessment      Edema;Purple/Maroon   Odor None   Site Assessment Edema;Exudate;Pink;Shiny;White   Exudate Type      Serous   Exudate Amnt      Large   Treatments Cleansed/Irrigation (Vashe )   Dressing Hydrofiber with silver;Gauze wrap - 4.5 Kerlix;Foam (Non-bordered)       Continence Status:   Incontinence of bladder: External urinary management system in place  Incontinent of bowel: Fecal management system in place    Moisture Associated Skin Damage:   - none noted     Lab Results   Component Value Date    WBC 16.9 (H) 04/28/2024    HGB 8.7 (L) 04/28/2024    HCT 26.5 (L) 04/28/2024    ESR 13 02/29/2024    CRP <5.0 02/29/2024    A1C 4.8 02/29/2024    GLU 164 04/28/2024    POCGLU 141 04/12/2024    ALBUMIN  2.4 (L) 04/28/2024    PROT 5.7 04/28/2024     Risk Factors:   - Cognitive Impairment  - Extended Hospitalization  - Friction/shear  - Immobility  - Lack of sensory perception  - Moisture  - Multiple co-morbidities    Braden Scale Score: 12       Support Surface:   - Low Air Loss - ICU      WOCN Recommendations:   - See nursing orders for wound care instructions.  - Contact WOCN with questions, concerns, or wound deterioration.  Continue pressure injury prevention intervention and nutrition support   Wound on right lower extremity currently with large amount of serous drainage due to edema. Recommend absorptive dressings. Secure with wrap gauze to limit adhesives on edematous skin     Topical Therapy/Interventions:   - Foam  - Hydrofiber    Recommended Consults:  - Not Applicable    WOCN Follow Up:  - We will sign off at this time    Plan of Care Discussed With:   - RN primary    Workup Time:   30 minutes  Reconsult for questions/concerns/deterioration.   Available by Epic Chat   Olam Budge RN, BSN, CWOCN

## 2024-04-29 LAB — BLOOD GAS, VENOUS
BASE EXCESS VENOUS: -0.7 (ref -2.0–2.0)
BASE EXCESS VENOUS: -1.5 (ref -2.0–2.0)
CARBOXYHEMOGLOBIN, VENOUS: 1.9 % — ABNORMAL HIGH (ref <1.2)
CARBOXYHEMOGLOBIN, VENOUS: 1.7 % — ABNORMAL HIGH (ref <1.2)
HCO3 VENOUS: 23 mmol/L (ref 22–27)
HCO3 VENOUS: 23 mmol/L (ref 22–27)
METHEMOGLOBIN, VENOUS: <1 % (ref <1.5)
METHEMOGLOBIN, VENOUS: <1 % (ref <1.5)
O2 SATURATION VENOUS: 74.4 % (ref 40.0–85.0)
O2 SATURATION VENOUS: 65 % (ref 40.0–85.0)
OXYHEMOGLOBIN, VENOUS: 72.5 % (ref 40.0–85.0)
OXYHEMOGLOBIN, VENOUS: 63.6 % (ref 40.0–85.0)
PCO2 VENOUS: 37 mmHg — ABNORMAL LOW (ref 40–60)
PCO2 VENOUS: 37 mmHg — ABNORMAL LOW (ref 40–60)
PH VENOUS: 7.41 (ref 7.32–7.43)
PH VENOUS: 7.4 (ref 7.32–7.43)
PO2 VENOUS: 44 mmHg (ref 30–55)
PO2 VENOUS: 39 mmHg (ref 30–55)

## 2024-04-29 LAB — HEPATIC FUNCTION PANEL
ALBUMIN: 2.2 g/dL — ABNORMAL LOW (ref 3.4–5.0)
ALKALINE PHOSPHATASE: 195 U/L — ABNORMAL HIGH (ref 46–116)
ALT (SGPT): 14 U/L (ref 10–49)
AST (SGOT): 29 U/L (ref <=34)
BILIRUBIN DIRECT: 0.5 mg/dL — ABNORMAL HIGH (ref 0.00–0.30)
BILIRUBIN TOTAL: 0.9 mg/dL (ref 0.3–1.2)
PROTEIN TOTAL: 5.7 g/dL (ref 5.7–8.2)

## 2024-04-29 LAB — CBC W/ AUTO DIFF
BASOPHILS ABSOLUTE COUNT: 0.1 10*9/L (ref 0.0–0.1)
BASOPHILS RELATIVE PERCENT: 0.6 %
EOSINOPHILS ABSOLUTE COUNT: 0.2 10*9/L (ref 0.0–0.5)
EOSINOPHILS RELATIVE PERCENT: 1.2 %
HEMATOCRIT: 26.8 % — ABNORMAL LOW (ref 34.0–44.0)
HEMOGLOBIN: 8.7 g/dL — ABNORMAL LOW (ref 11.3–14.9)
LYMPHOCYTES ABSOLUTE COUNT: 1.3 10*9/L (ref 1.1–3.6)
LYMPHOCYTES RELATIVE PERCENT: 10.3 %
MEAN CORPUSCULAR HEMOGLOBIN CONC: 32.5 g/dL (ref 32.0–36.0)
MEAN CORPUSCULAR HEMOGLOBIN: 31 pg (ref 25.9–32.4)
MEAN CORPUSCULAR VOLUME: 95.3 fL (ref 77.6–95.7)
MEAN PLATELET VOLUME: 9.5 fL (ref 6.8–10.7)
MONOCYTES ABSOLUTE COUNT: 1.2 10*9/L — ABNORMAL HIGH (ref 0.3–0.8)
MONOCYTES RELATIVE PERCENT: 9.2 %
NEUTROPHILS ABSOLUTE COUNT: 10.2 10*9/L — ABNORMAL HIGH (ref 1.8–7.8)
NEUTROPHILS RELATIVE PERCENT: 78.7 %
PLATELET COUNT: 159 10*9/L (ref 150–450)
RED BLOOD CELL COUNT: 2.81 10*12/L — ABNORMAL LOW (ref 3.95–5.13)
RED CELL DISTRIBUTION WIDTH: 18.4 % — ABNORMAL HIGH (ref 12.2–15.2)
WBC ADJUSTED: 12.9 10*9/L — ABNORMAL HIGH (ref 3.6–11.2)

## 2024-04-29 LAB — CYSTATIN C
CYSTATIN C: 1.63 mg/L — ABNORMAL HIGH (ref 0.64–1.23)
EGFR CKD-EPI (2012) CYSTATIN C FEMALE: 35 mL/min/1.73m2 — ABNORMAL LOW (ref >=60)

## 2024-04-29 LAB — BASIC METABOLIC PANEL
ANION GAP: 12 mmol/L (ref 5–14)
BLOOD UREA NITROGEN: 19 mg/dL (ref 9–23)
BUN / CREAT RATIO: 44
CALCIUM: 8.5 mg/dL — ABNORMAL LOW (ref 8.7–10.4)
CHLORIDE: 101 mmol/L (ref 98–107)
CO2: 22 mmol/L (ref 20.0–31.0)
CREATININE: 0.43 mg/dL — ABNORMAL LOW (ref 0.55–1.02)
EGFR CKD-EPI (2021) FEMALE: >90 mL/min/1.73m2 (ref >=60)
GLUCOSE RANDOM: 138 mg/dL (ref 70–179)
POTASSIUM: 4.3 mmol/L (ref 3.4–4.8)
SODIUM: 135 mmol/L (ref 135–145)

## 2024-04-29 LAB — VANCOMYCIN, RANDOM: VANCOMYCIN RANDOM: 7.6 ug/mL

## 2024-04-29 LAB — MAGNESIUM: MAGNESIUM: 2.1 mg/dL (ref 1.6–2.6)

## 2024-04-29 LAB — PHOSPHORUS: PHOSPHORUS: 2.8 mg/dL (ref 2.4–5.1)

## 2024-04-29 MED ADMIN — sodium chloride (NS) 0.9 % flush 10 mL: 10 mL | INTRAVENOUS | @ 09:00:00

## 2024-04-29 MED ADMIN — sodium chloride (NS) 0.9 % flush 10 mL: 10 mL | INTRAVENOUS | @ 17:00:00

## 2024-04-29 MED ADMIN — sodium chloride (NS) 0.9 % flush 10 mL: 10 mL | INTRAVENOUS | @ 23:00:00

## 2024-04-29 MED ADMIN — rifAXIMin (XIFAXAN) oral suspension: 550 mg | GASTROENTERAL | @ 14:00:00 | Stop: 2025-04-11

## 2024-04-29 MED ADMIN — rifAXIMin (XIFAXAN) oral suspension: 550 mg | GASTROENTERAL | @ 01:00:00 | Stop: 2025-04-11

## 2024-04-29 MED ADMIN — lactulose oral solution: 30 g | GASTROENTERAL | @ 14:00:00

## 2024-04-29 MED ADMIN — lactulose oral solution: 30 g | GASTROENTERAL | @ 01:00:00

## 2024-04-29 MED ADMIN — lactulose oral solution: 30 g | GASTROENTERAL | @ 20:00:00

## 2024-04-29 MED ADMIN — folic acid (FOLVITE) tablet 1 mg: 1 mg | GASTROENTERAL | @ 14:00:00

## 2024-04-29 MED ADMIN — metoPROLOL tartrate (Lopressor) tablet 25 mg: 25 mg | GASTROENTERAL | @ 01:00:00

## 2024-04-29 MED ADMIN — metoPROLOL tartrate (Lopressor) tablet 25 mg: 25 mg | GASTROENTERAL | @ 14:00:00

## 2024-04-29 MED ADMIN — thiamine mononitrate (vit B1) tablet 100 mg: 100 mg | GASTROENTERAL | @ 14:00:00

## 2024-04-29 MED ADMIN — ergocalciferol (DRISDOL) oral drops 200 mcg/mL (8,000 unit/mL): 100 ug | GASTROENTERAL | @ 14:00:00 | Stop: 2024-05-21

## 2024-04-29 MED ADMIN — cyanocobalamin (vitamin B-12) tablet 1,000 mcg: 1000 ug | GASTROENTERAL | @ 14:00:00

## 2024-04-29 MED ADMIN — levETIRAcetam (KEPPRA) tablet 750 mg: 750 mg | GASTROENTERAL | @ 14:00:00

## 2024-04-29 MED ADMIN — levETIRAcetam (KEPPRA) tablet 750 mg: 750 mg | GASTROENTERAL | @ 01:00:00

## 2024-04-29 MED ADMIN — famotidine (PEPCID) tablet 20 mg: 20 mg | GASTROENTERAL | @ 14:00:00

## 2024-04-29 MED ADMIN — melatonin tablet 3 mg: 3 mg | GASTROENTERAL | @ 23:00:00

## 2024-04-29 MED ADMIN — apixaban (ELIQUIS) tablet 5 mg: 5 mg | GASTROENTERAL | @ 01:00:00

## 2024-04-29 MED ADMIN — apixaban (ELIQUIS) tablet 5 mg: 5 mg | GASTROENTERAL | @ 14:00:00

## 2024-04-29 MED ADMIN — cefepime (MAXIPIME) 2 g in sodium chloride 0.9 % (NS) 100 mL IVPB-MBP: 2 g | INTRAVENOUS | @ 22:00:00 | Stop: 2024-05-04

## 2024-04-29 MED ADMIN — cefepime (MAXIPIME) 2 g in sodium chloride 0.9 % (NS) 100 mL IVPB-MBP: 2 g | INTRAVENOUS | @ 10:00:00 | Stop: 2024-05-04

## 2024-04-29 MED ADMIN — zinc acetate oral solution: 50 mg | GASTROENTERAL | Stop: 2024-05-06

## 2024-04-29 MED ADMIN — esomeprazole (NEXIUM) granules 40 mg: 40 mg | GASTROENTERAL | @ 14:00:00

## 2024-04-29 MED ADMIN — multivitamins, therapeutic with minerals tablet 1 tablet: 1 | GASTROENTERAL | @ 14:00:00

## 2024-04-29 MED ADMIN — lacosamide (VIMPAT) tablet 100 mg: 100 mg | GASTROENTERAL | @ 14:00:00

## 2024-04-29 MED ADMIN — lacosamide (VIMPAT) tablet 100 mg: 100 mg | GASTROENTERAL | @ 01:00:00

## 2024-04-29 MED ADMIN — fentaNYL (PF) (SUBLIMAZE) injection 50 mcg: 50 ug | INTRAVENOUS | @ 10:00:00 | Stop: 2024-04-29

## 2024-04-29 MED ADMIN — fentaNYL (PF) (SUBLIMAZE) injection 50 mcg: 50 ug | INTRAVENOUS | @ 05:00:00 | Stop: 2024-05-05

## 2024-04-29 MED ADMIN — fentaNYL (PF) (SUBLIMAZE) injection 50 mcg: 50 ug | INTRAVENOUS | @ 15:00:00 | Stop: 2024-04-29

## 2024-04-29 NOTE — Plan of Care (Signed)
 Pt received from MICU this evening with 6 Shiley trach. Currently on ventilator settings PRVC 330/12/+5/25%. Suctioned for small amt of thick yellow secretions/slightly pink tinged, airway remains secure and patent at this time. Emergency equipment and trach supplies available at bedside.     Problem: Mechanical Ventilation Invasive  Goal: Effective Communication  Outcome: Ongoing - Unchanged  Goal: Optimal Device Function  Outcome: Ongoing - Unchanged  Goal: Mechanical Ventilation Liberation  Outcome: Ongoing - Unchanged  Goal: Absence of Device-Related Skin and Tissue Injury  Outcome: Ongoing - Unchanged  Goal: Absence of Ventilator-Induced Lung Injury  Outcome: Ongoing - Unchanged     Problem: Artificial Airway  Goal: Effective Communication  Outcome: Ongoing - Unchanged  Goal: Optimal Device Function  Outcome: Ongoing - Unchanged  Goal: Absence of Device-Related Skin or Tissue Injury  Outcome: Ongoing - Unchanged

## 2024-04-29 NOTE — Progress Notes (Signed)
 Hospital Medicine MICU Transfer Accept Note    Assessment/Plan:    Principal Problem:    Bacteremia, escherichia coli  Active Problems:    Alcoholic liver disease (HHS-HCC)    HTN (hypertension)    Depression with anxiety    Class 2 severe obesity with serious comorbidity in adult    Atrial fibrillation    (CMS-HCC)    Pleural effusion    Hypernatremia    Thrombocytopenia    Cirrhosis    (CMS-HCC)    A-fib (CMS-HCC)    Hepatic encephalopathy    (CMS-HCC)    Anaphylaxis   Malnutrition Evaluation as performed by RD, LDN: Patient does not meet AND/ASPEN criteria for malnutrition at this time (03/16/24 1326)    Please see wound care flowsheets for wound documentation     LOS: 50 days (under inpatient status)    [name] is a 78 y.o. woman with HTN, MetALD cirrhosis, severe obesity (BMI > 40), persistent Afib, HFimpEF (40% 2017 --> >55% 02/2024), depression, right breast cancer (ER+) s/p partial mastectomy and adjuvant XRT (2022) now on anastrozole , who was initially admitted at [facility] on 03/08/2024 for encephalopathy following sedation for DCCV (aborted due to LAA thrombus). Complicated hospital course followed, with recurrent episodes of hypoxemic hypercarbic respiratory failure mostly due to persistent encephalopathy leading to hypoventilation, ultimately requiring tracheostomy. Now transferred out of MICU on 04/29/2024 for wean of ventilator support.    Please refer to hospital course (once updated) for details. Active issues as follows.    PULM    # Acute hypoxemic hypercarbic respiratory failure  # Tracheostomy dependence  Failed extubation x 3 this admission; now has tracheostomy. Unable to tolerate sustained periods off volume-controlled mode so far. Poor mechanics, secretions, volume overload probably all a factor  -- first trach change by ENT done 04/17/2024  -- routine trach care per RT (#6 Shiley cuffed)  -- vent wean with help from RT, likely pulmonology consult service  -- START diuresis, Lasix  40 mg IV bid and monitor response    # Ventilator-associated pneumonia  Dx 04/28/2024. Tracheal aspirate growing Staph aureus, Klebsiella  -- cefepime , vancomycin  x 7 days (end = 05/04/2024)    CV    # Persistent atrial fibrillation  # LAA thrombus  Rates adequately controlled on current regimen  -- metoprolol  25 mg bid  -- apixaban  5 mg bid    # Chronic diastolic heart failure (HFimpEF)  Not an active issue; edema unlikely cardiogenic in nature  -- strict intake / output    # Right internal jugular / carotid fistula, resolved  Apparently thrombosed; no flow on carotid doppler 03/26/2024  -- discuss with vascular surgery whether any follow-up needed before discharge    GI    # Decompensated MetALD cirrhosis with hepatic encephalopathy  -- hepatology signed off; re-engage prn  -- titrate lactulose  to ~[phone] mL stool daily (per MICU plan)  -- continue rifaximin   -- continue multivitamin, thiamine     # Upper GI bleed due to Mallory-Weiss tear  -- continue PPI daily  -- famotidine  bid for GI ppx in critical illness (?)    # Fecal incontinence  -- rectal tube to remain for now given brisk stool output    # Nutrition  -- NG tube to remain  -- tube feeds as below; provides 100% caloric needs    RENAL    # Hypernatremia, improved  Recurring issue earlier in hospital course, now trending down. Will need ongoing adjustment of free water  depending on  stool output  -- REDUCE FWF 200 mL --> 100 mL q2        # Anasarca  Due to prolonged critical illness, liver disease, etc  -- diuresis as above    NEURO    # Encephalopathy, multifactorial  At this point hard to tell what (if anything) is most important driver. Many metabolic derangements, long hospital stay, prolonged sedation for mechanical ventilation, cardiac arrest, etc etc. Has not improved much despite brisk stool output and ammonia improved so little more to optimize re hepatic encephalopathy. Doubt ongoing seizure but may still be recovering from period of subclinical seizure.   -- ensure adequate bowel movements to ward off hepatic encephalopathy  -- optimize electrolytes  -- control seizures as below  -- minimize centrally-acting medications as much as feasible  -- routine delirium ppx  -- repeat MRI brain w/wo ~05/05/2024 to monitor cortical restricted diffusion seen on prior study    # Focal non-convulsive seizures  -- levetiracetam  750 mg bid  -- lacosamide  100 mg bid  -- re-consult neurology if breakthrough activity    SOCIAL    # Disposition / GoC  See many prior ACP notes from prior teams including palliative care. Currently full code with plan for full restorative care IF there is possibility for functional recovery.   -- family meeting ~05/04/2024; please coordinate with daughter Glori Machnik  -- family declines transfer back to [facility] given rather traumatic experience there      Daily checklist:  VTE prophylaxis: therapeutic anticoagulation  Diet: Separate Enteral Additives ProSource NoCarb (Protein Modular/Sugarfree); Quantity: 2 pkts BID  Nutren 2.0 continuous tube feed  Code status: Full Code  HCDM:   HCDM (patient stated preference): Garate,[name]    HCDM (patient stated preference): Degrace,[name] - Daughter - [phone]    Disposition: stepdown status. Not medically ready for discharge. Anticipate discharge to [facility] for prolonged trach/vent wean unless goals of care change    PT/OT recommendations: Low intensity, 5x weekly (04/25/24) / To be determined (04/25/24)  DME needs: Defer to post acute (04/25/24) / Defer to post acute (04/25/24)    I personally spent 90 minutes face-to-face and non-face-to-face in the care of this patient, which includes all pre, intra, and post visit time on the date of service.  All documented time was specific to the E/M visit and does not include any procedures that may have been performed.    Please page [facility] New Admit [phone] with questions. This note reflects active problems and daily updates.    ___________________________________________________________________    Hospital course to date  (Not updated at time of transfer. What follows is my review of primary team notes and flowsheets.)    She was found to have E coli bacteremia from unclear source (UA was bland; mostly likely enteric). Diagnosed with hepatic encephalopathy. Developed acute hypoxemic respiratory failure and was intubated 03/11/2024, transferred to MICU. Mental status improved with treatment of HE and infection; extubated 03/14/2024 and transferred back to [facility] (geriatrics) 03/15/2024. Had some hematochezia felt to be related to an as-yet-uncharacterized mass seen on CT in sigmoid colon. Tolerated resumption of anticoagulation for Afib / LAA thrombus, was moving towards SNF vs AIR but delayed due to hypernatremia and some residual encephalopathy.    Had acute deterioration in mental status 03/24/2024 - 03/25/2024, again developed hypoxemic hypercarbic respiratory failure related to poor mental status, was re-intubated 03/25/2024. This was complicated by PEA arrest and shock. Right neck central line was then placed but  unfortunately into the carotid; removed without immediate complication, but prompted transfer back to main campus for vascular surgery consult. Also noted on CTA obtained to evaluate that injury was acute bleed in gastric lumen suspected to be related to enteric tube placement. EGD done 03/26/2024, site clipped. Internal jugular vein-carotid fistula thrombosed so repair was not required.    Extubation was delayed by poor mental status, attributed to combination of hepatic encephalopathy, hypernatremia, and garden-variety delirium. Extubated 04/01/2024, again re-intubated 04/03/2024 (then therapeutic bronchoscopy) for respiratory failure due to decline in mental status. Intermittent vasopressor requirements. Self-extubated 04/06/2024; unable to maintain airway and ventilation so again re-intubated 04/07/2024. Multiple conversations with MICU team, palliative care team, and family; eventually decided to proceed with tracheostomy, performed by ENT on 04/15/2024.    Developed abnormal eye movements 04/18/2024; exact semiology difficult to glean from notes. Neurology consulted, cvEEG notable for posterior quadrant seizures and background changes of severe encephalopathy. Loaded with levetiracetam  then lacosamide . Ammonia found to be >200; discussed with nephrology but not dialyzed. MRI showed restricted diffusion in bilateral parieto-occipital regions, thought to be due to hyperammonemia or prolonged subclinical seizures (ie not previously recognized) rather than PRES. Off cvEEG by 04/26/2024.    Over next several days, unable to tolerate HFNC via trach collar or pressure support mode so remained in volume-controlled ventilator mode. Since off vasopressors and otherwise clinically stable (albeit still quite ill), transfer out of ICU was requested to continue what is anticipated to be a prolonged wean of ventilator support. Family meeting tentatively planned for about 05/04/2024, with patient's daughter Vernadette Stutsman as primary decision-maker.    Subjective (past ~24 hours):  Received IV fluid boluses for low urine output, clinical dehydration  Started cefepime , started then stopped vancomycin  for new VAP  No significant change noted in quantity or quality of respiratory secretions  Failed pressure support trial 04/28/2024  Has been unable to tolerate trach collar due to tachypnea previously    Patient is not available to give any history due to tracheostomy, and does not respond to yes/no questions    Discussed with nursing, RT, MICU team  Called patient's daughter to inform her of change in unit and team    Labs/Studies:  Labs and Studies from the last 24hrs per EMR and Reviewed    Objective:  Temp:  [37.2 ??C (98.9 ??F)] 37.2 ??C (98.9 ??F)  Pulse:  [82-126] 106  SpO2 Pulse:  [84-129] 107  Resp:  [15-36] 28  BP: (91-169)/(39-102) 169/102  FiO2 (%):  [25 %] 25 %  SpO2:  [94 %-99 %] 98 %    GEN: ill appearing woman lying in bed  HEENT: small-bore feeding tube present; lips dry  CV: irreg irreg, normal S1/S2, soft 1/6 systolic murmur  PULM: coarse rhonci bilaterally; moderately thick clear secretions in trach  ABD: obese, soft, nondistended, does not wince with palpation, +bowel sounds  EXT: gross anasarca diffusely  NEURO: rouses to voice, resists exam and attempts to shove my hand off abdomen, does not track, does not follow commands. PERRL    LUE PICC present    ------------  Prentice SHAUNNA Search, MD PhD  [facility] Medicine

## 2024-04-29 NOTE — Consults (Signed)
 Vancomycin  Therapeutic Monitoring Pharmacy Note    [name] is a 78 y.o. female continuing vancomycin . Date of therapy initiation: 11/27    Indication: Bacteremia/Sepsis    Prior Dosing Information: Previous regimen 1000 mg q24hr     Goals:  Therapeutic Drug Levels  Vancomycin  trough goal: 10-15 mg/L    Additional Clinical Monitoring/Outcomes  Renal function, volume status (intake and output)    Results: Vancomycin  level: 7.6 mg/L, drawn 0454 (true trough 5.5 mg/L)    Wt Readings from Last 1 Encounters:   04/29/24 (!) 114.1 kg (251 lb 8.7 oz)     Creatinine   Date Value Ref Range Status   04/29/2024 0.43 (L) 0.55 - 1.02 mg/dL Final   88/72/7974 9.49 (L) 0.55 - 1.02 mg/dL Final   88/73/7974 9.58 (L) 0.55 - 1.02 mg/dL Final        Pharmacokinetic Considerations and Significant Drug Interactions:  Adult (calculated on 10/26): Vd = 63 L, ke = 0.032 hr-1  Concurrent nephrotoxic meds: not applicable    Assessment/Plan:  Recommendation(s)  Change current regimen to vancomycin  1000 mg q12hr  Patient was recently on vancomycin  from 10/24-27 and was supratherapeutic on 750 mg q12hr (trough 23.3 mg/L on 10/26). Based on previous kinetics, she was started her on previous regimen of 1000 mg q24hr. However, estimated trough with this regimen was 5.5, so plan to increase her dose accordingly.  Estimated trough on recommended regimen: 11.2 mg/L    Follow-up  Level due: prior to fourth or fifth dose  A pharmacist will continue to monitor and order levels as appropriate    Please page service pharmacist with questions/clarifications.    Duwaine JAYSON Mungo, PharmD  PGY1 Ambulatory Care Pharmacy Resident

## 2024-04-29 NOTE — Plan of Care (Signed)
 Patient arrived from MICU at 1700. She is drowsy, unable to assess orientation status at this time. Spontaneously open eyes but does not track. Non-purposeful movements noted to the BUE. VSS, O2 sats >90% on vent. Afebrile. Scoring a 0 on NAPS scale. UO diminished via purewick. FMS in place. Nutren 2.0  running at goal through corpak. Skin weeps and extremities all with pitting edema. Q2H turns and standard precautions maintained. Daughter at bedside. All monitors with appopriate alarm settings, see flowsheets for further information.      Problem: Skin Injury Risk Increased  Goal: Skin Health and Integrity  Intervention: Optimize Skin Protection  Recent Flowsheet Documentation  Taken 04/29/2024 1800 by Gladis Andrez HERO, RN  Head of Bed [facility]) Positioning: HOB at 30-45 degrees  Taken 04/29/2024 1704 by Gladis Andrez HERO, RN  Pressure Reduction Techniques:   frequent weight shift encouraged   heels elevated off bed  Head of Bed (HOB) Positioning: HOB at 30-45 degrees  Pressure Reduction Devices:   heel offloading device utilized   positioning supports utilized  Skin Protection:   adhesive use limited   incontinence pads utilized     Problem: Adult Inpatient Plan of Care  Goal: Absence of Hospital-Acquired Illness or Injury  Intervention: Identify and Manage Fall Risk  Recent Flowsheet Documentation  Taken 04/29/2024 1704 by Gladis Andrez HERO, RN  Safety Interventions:   aspiration precautions   bed alarm   environmental modification   fall reduction program maintained   lighting adjusted for tasks/safety   low bed   nonskid shoes/slippers when out of bed   room near unit station   supervised activity   toileting scheduled   commode/urinal/bedpan at bedside   enteral feeding safety   family at bedside  Intervention: Prevent Skin Injury  Recent Flowsheet Documentation  Taken 04/29/2024 1800 by Gladis Andrez HERO, RN  Positioning for Skin: Right  Taken 04/29/2024 1704 by Gladis Andrez HERO, RN  Positioning for Skin: Left  Device Skin Pressure Protection:   absorbent pad utilized/changed   adhesive use limited   positioning supports utilized   pressure points protected   tubing/devices free from skin contact  Skin Protection:   adhesive use limited   incontinence pads utilized  Intervention: Prevent Infection  Recent Flowsheet Documentation  Taken 04/29/2024 1704 by Gladis Andrez HERO, RN  Infection Prevention:   cohorting utilized   environmental surveillance performed   equipment surfaces disinfected   hand hygiene promoted   personal protective equipment utilized   rest/sleep promoted   single patient room provided   visitors restricted/screened     Problem: Breathing Pattern Ineffective  Goal: Effective Breathing Pattern  Intervention: Promote Improved Breathing Pattern  Recent Flowsheet Documentation  Taken 04/29/2024 1800 by Gladis Andrez HERO, RN  Head of Bed [facility]) Positioning: HOB at 30-45 degrees  Taken 04/29/2024 1704 by Gladis Andrez HERO, RN  Head of Bed [facility]) Positioning: HOB at 30-45 degrees     Problem: Fall Injury Risk  Goal: Absence of Fall and Fall-Related Injury  Intervention: Promote Injury-Free Environment  Recent Flowsheet Documentation  Taken 04/29/2024 1704 by Gladis Andrez HERO, RN  Safety Interventions:   aspiration precautions   bed alarm   environmental modification   fall reduction program maintained   lighting adjusted for tasks/safety   low bed   nonskid shoes/slippers when out of bed   room near unit station   supervised activity   toileting scheduled   commode/urinal/bedpan at bedside   enteral feeding safety  family at bedside     Problem: Gas Exchange Impaired  Goal: Optimal Gas Exchange  Intervention: Optimize Oxygenation and Ventilation  Recent Flowsheet Documentation  Taken 04/29/2024 1800 by Gladis Andrez HERO, RN  Head of Bed [facility]) Positioning: HOB at 30-45 degrees  Taken 04/29/2024 1704 by Gladis Andrez HERO, RN  Head of Bed [facility]) Positioning: HOB at 30-45 degrees     Problem: Non-Violent Restraints  Intervention: Patient Monitoring  Recent Flowsheet Documentation  Taken 04/29/2024 1800 by Gladis Andrez HERO, RN  Psychological Status/Visual Check: Subdued  Circulation/Skin Integrity: No signs of injury  Range of Motion: Performed  Fluids: NPO  Food/Meal: Enteral feeding/TPN  Elimination: Rectal tube  Taken 04/29/2024 1600 by Gladis Andrez HERO, RN  Psychological Status/Visual Check: Subdued  Circulation/Skin Integrity: No signs of injury  Range of Motion: Performed  Fluids: NPO  Food/Meal: Enteral feeding/TPN  Elimination: Rectal tube     Problem: Wound  Goal: Absence of Infection Signs and Symptoms  Intervention: Prevent or Manage Infection  Recent Flowsheet Documentation  Taken 04/29/2024 1704 by Gladis Andrez HERO, RN  Infection Management: aseptic technique maintained  Goal: Skin Health and Integrity  Intervention: Optimize Skin Protection  Recent Flowsheet Documentation  Taken 04/29/2024 1800 by Gladis Andrez HERO, RN  Head of Bed [facility]) Positioning: HOB at 30-45 degrees  Taken 04/29/2024 1704 by Gladis Andrez HERO, RN  Pressure Reduction Techniques:   frequent weight shift encouraged   heels elevated off bed  Head of Bed (HOB) Positioning: HOB at 30-45 degrees  Pressure Reduction Devices:   heel offloading device utilized   positioning supports utilized  Skin Protection:   adhesive use limited   incontinence pads utilized     Problem: Mechanical Ventilation Invasive  Goal: Optimal Device Function  Intervention: Optimize Device Care and Function  Recent Flowsheet Documentation  Taken 04/29/2024 1600 by Gladis Andrez HERO, RN  Oral Care:   teeth brushed   suction provided   mouth swabbed  Goal: Absence of Device-Related Skin and Tissue Injury  Intervention: Maintain Skin and Tissue Health  Recent Flowsheet Documentation  Taken 04/29/2024 1704 by Gladis Andrez HERO, RN  Device Skin Pressure Protection:   absorbent pad utilized/changed   adhesive use limited   positioning supports utilized   pressure points protected   tubing/devices free from skin contact  Goal: Absence of Ventilator-Induced Lung Injury  Intervention: Prevent Ventilator-Associated Pneumonia  Recent Flowsheet Documentation  Taken 04/29/2024 1800 by Gladis Andrez HERO, RN  Head of Bed [facility]) Positioning: HOB at 30-45 degrees  Taken 04/29/2024 1704 by Gladis Andrez HERO, RN  Head of Bed [facility]) Positioning: HOB at 30-45 degrees  Taken 04/29/2024 1600 by Gladis Andrez HERO, RN  Oral Care:   teeth brushed   suction provided   mouth swabbed     Problem: Mechanical Ventilation Invasive  Goal: Optimal Device Function  Intervention: Optimize Device Care and Function  Recent Flowsheet Documentation  Taken 04/29/2024 1600 by Gladis Andrez HERO, RN  Oral Care:   teeth brushed   suction provided   mouth swabbed  Goal: Absence of Device-Related Skin and Tissue Injury  Intervention: Maintain Skin and Tissue Health  Recent Flowsheet Documentation  Taken 04/29/2024 1704 by Gladis Andrez HERO, RN  Device Skin Pressure Protection:   absorbent pad utilized/changed   adhesive use limited   positioning supports utilized   pressure points protected   tubing/devices free from skin contact  Goal: Absence of Ventilator-Induced Lung Injury  Intervention: Prevent Ventilator-Associated Pneumonia  Recent Flowsheet  Documentation  Taken 04/29/2024 1800 by Gladis Andrez HERO, RN  Head of Bed [facility]) Positioning: HOB at 30-45 degrees  Taken 04/29/2024 1704 by Gladis Andrez HERO, RN  Head of Bed [facility]) Positioning: HOB at 30-45 degrees  Taken 04/29/2024 1600 by Gladis Andrez HERO, RN  Oral Care:   teeth brushed   suction provided   mouth swabbed     Problem: Mechanical Ventilation Invasive  Goal: Optimal Device Function  Intervention: Optimize Device Care and Function  Recent Flowsheet Documentation  Taken 04/29/2024 1600 by Gladis Andrez HERO, RN  Oral Care:   teeth brushed   suction provided   mouth swabbed  Goal: Absence of Device-Related Skin and Tissue Injury  Intervention: Maintain Skin and Tissue Health  Recent Flowsheet Documentation  Taken 04/29/2024 1704 by Gladis Andrez HERO, RN  Device Skin Pressure Protection:   absorbent pad utilized/changed   adhesive use limited   positioning supports utilized   pressure points protected   tubing/devices free from skin contact  Goal: Absence of Ventilator-Induced Lung Injury  Intervention: Prevent Ventilator-Associated Pneumonia  Recent Flowsheet Documentation  Taken 04/29/2024 1800 by Gladis Andrez HERO, RN  Head of Bed [facility]) Positioning: HOB at 30-45 degrees  Taken 04/29/2024 1704 by Gladis Andrez HERO, RN  Head of Bed [facility]) Positioning: HOB at 30-45 degrees  Taken 04/29/2024 1600 by Gladis Andrez HERO, RN  Oral Care:   teeth brushed   suction provided   mouth swabbed     Problem: Mechanical Ventilation Invasive  Goal: Optimal Device Function  Intervention: Optimize Device Care and Function  Recent Flowsheet Documentation  Taken 04/29/2024 1600 by Gladis Andrez HERO, RN  Oral Care:   teeth brushed   suction provided   mouth swabbed  Goal: Absence of Device-Related Skin and Tissue Injury  Intervention: Maintain Skin and Tissue Health  Recent Flowsheet Documentation  Taken 04/29/2024 1704 by Gladis Andrez HERO, RN  Device Skin Pressure Protection:   absorbent pad utilized/changed   adhesive use limited   positioning supports utilized   pressure points protected   tubing/devices free from skin contact  Goal: Absence of Ventilator-Induced Lung Injury  Intervention: Prevent Ventilator-Associated Pneumonia  Recent Flowsheet Documentation  Taken 04/29/2024 1800 by Gladis Andrez HERO, RN  Head of Bed [facility]) Positioning: HOB at 30-45 degrees  Taken 04/29/2024 1704 by Gladis Andrez HERO, RN  Head of Bed [facility]) Positioning: HOB at 30-45 degrees  Taken 04/29/2024 1600 by Gladis Andrez HERO, RN  Oral Care:   teeth brushed   suction provided   mouth swabbed     Problem: Infection  Goal: Absence of Infection Signs and Symptoms  Intervention: Prevent or Manage Infection  Recent Flowsheet Documentation  Taken 04/29/2024 1704 by Gladis Andrez HERO, RN  Infection Management: aseptic technique maintained     Problem: Mechanical Ventilation Invasive  Goal: Optimal Device Function  Intervention: Optimize Device Care and Function  Recent Flowsheet Documentation  Taken 04/29/2024 1600 by Gladis Andrez HERO, RN  Oral Care:   teeth brushed   suction provided   mouth swabbed  Goal: Absence of Device-Related Skin and Tissue Injury  Intervention: Maintain Skin and Tissue Health  Recent Flowsheet Documentation  Taken 04/29/2024 1704 by Gladis Andrez HERO, RN  Device Skin Pressure Protection:   absorbent pad utilized/changed   adhesive use limited   positioning supports utilized   pressure points protected   tubing/devices free from skin contact  Goal: Absence of Ventilator-Induced Lung Injury  Intervention: Prevent Ventilator-Associated Pneumonia  Recent Flowsheet  Documentation  Taken 04/29/2024 1800 by Gladis Andrez HERO, RN  Head of Bed [facility]) Positioning: HOB at 30-45 degrees  Taken 04/29/2024 1704 by Gladis Andrez HERO, RN  Head of Bed [facility]) Positioning: HOB at 30-45 degrees  Taken 04/29/2024 1600 by Gladis Andrez HERO, RN  Oral Care:   teeth brushed   suction provided   mouth swabbed     Problem: Mechanical Ventilation Invasive  Goal: Optimal Device Function  Intervention: Optimize Device Care and Function  Recent Flowsheet Documentation  Taken 04/29/2024 1600 by Gladis Andrez HERO, RN  Oral Care:   teeth brushed   suction provided   mouth swabbed  Goal: Absence of Device-Related Skin and Tissue Injury  Intervention: Maintain Skin and Tissue Health  Recent Flowsheet Documentation  Taken 04/29/2024 1704 by Gladis Andrez HERO, RN  Device Skin Pressure Protection:   absorbent pad utilized/changed   adhesive use limited   positioning supports utilized   pressure points protected   tubing/devices free from skin contact  Goal: Absence of Ventilator-Induced Lung Injury  Intervention: Prevent Ventilator-Associated Pneumonia  Recent Flowsheet Documentation  Taken 04/29/2024 1800 by Gladis Andrez HERO, RN  Head of Bed [facility]) Positioning: HOB at 30-45 degrees  Taken 04/29/2024 1704 by Gladis Andrez HERO, RN  Head of Bed [facility]) Positioning: HOB at 30-45 degrees  Taken 04/29/2024 1600 by Gladis Andrez HERO, RN  Oral Care:   teeth brushed   suction provided   mouth swabbed     Problem: Artificial Airway  Goal: Optimal Device Function  Intervention: Optimize Device Care and Function  Recent Flowsheet Documentation  Taken 04/29/2024 1704 by Gladis Andrez HERO, RN  Aspiration Precautions:   awake/alert before oral intake   distractions minimized during oral intake  Taken 04/29/2024 1600 by Gladis Andrez HERO, RN  Oral Care:   teeth brushed   suction provided   mouth swabbed  Goal: Absence of Device-Related Skin or Tissue Injury  Intervention: Maintain Skin and Tissue Health  Recent Flowsheet Documentation  Taken 04/29/2024 1704 by Gladis Andrez HERO, RN  Device Skin Pressure Protection:   absorbent pad utilized/changed   adhesive use limited   positioning supports utilized   pressure points protected   tubing/devices free from skin contact     Problem: Mechanical Ventilation Invasive  Goal: Optimal Device Function  Intervention: Optimize Device Care and Function  Recent Flowsheet Documentation  Taken 04/29/2024 1600 by Gladis Andrez HERO, RN  Oral Care:   teeth brushed   suction provided   mouth swabbed  Goal: Absence of Device-Related Skin and Tissue Injury  Intervention: Maintain Skin and Tissue Health  Recent Flowsheet Documentation  Taken 04/29/2024 1704 by Gladis Andrez HERO, RN  Device Skin Pressure Protection:   absorbent pad utilized/changed   adhesive use limited   positioning supports utilized   pressure points protected   tubing/devices free from skin contact  Goal: Absence of Ventilator-Induced Lung Injury  Intervention: Prevent Ventilator-Associated Pneumonia  Recent Flowsheet Documentation  Taken 04/29/2024 1800 by Gladis Andrez HERO, RN  Head of Bed [facility]) Positioning: HOB at 30-45 degrees  Taken 04/29/2024 1704 by Gladis Andrez HERO, RN  Head of Bed [facility]) Positioning: HOB at 30-45 degrees  Taken 04/29/2024 1600 by Gladis Andrez HERO, RN  Oral Care:   teeth brushed   suction provided   mouth swabbed

## 2024-04-29 NOTE — Plan of Care (Signed)
 Pt transferred to MPCU this afternoon, daughter at bedside.     Shift Summary  [id number]  was administered for pain, resulting in sustained improvement in comfort and pain scores.   Ventilator support and airway patency were maintained, with improved ventilator compliance after initial coughing.   Cognitive status remained unchanged, with persistent drowsiness and inability to follow commands.   Infection prevention protocols were followed, and MRSA was not detected on screening.   Overall, comfort improved and infection prevention measures were upheld, but cognitive function and ventilator dependence persisted.

## 2024-04-29 NOTE — Progress Notes (Signed)
 MICU Daily Progress Note     Date of Service: 04/29/2024    Problem List:   Principal Problem:    Bacteremia, escherichia coli  Active Problems:    Alcoholic liver disease (HHS-HCC)    HTN (hypertension)    Depression with anxiety    Class 2 severe obesity with serious comorbidity in adult    Atrial fibrillation    (CMS-HCC)    Pleural effusion    Hypernatremia    Thrombocytopenia    Cirrhosis    (CMS-HCC)    A-fib (CMS-HCC)    Hepatic encephalopathy    (CMS-HCC)    Anaphylaxis      Interval history: [name] is a 78 y.o. female with HFpEF, HTN, Afib on AC, MASLD cirrhosis, originally hospitalized for scheduled TEE/DCCV (aborted d/t LAA clot) c/b AHHRF, encephalopathy and septic shock requiring MICU care, was s/p extubation transferred to [facility] for further management of anticoagulation and hypernatremia. Transferred back to [facility] MICU for closer monitoring for respiratory status, pressor requirement, GI bleed, and management of carotid artery injury. Now s/p tracheostomy with stable ventilator settings.     24 hour events:  - no change to neuro exam, remains on pressure support  - rising leukocytosis and increased secretions yesterday -> started on vanc/cefepime  with infectious workup   - fentanyl  pushes added overnight for discomfort       Neurological     Hepatic encephalopathy - Non-Convulsive Seizures   Rapid response called on 10/24 at [facility] for somnolence. Ddx  hepatic encephalopathy, toxic metabolic encephalopathy in the setting of shock (possibly sepsis), intracranial pathology. Intubated.  No acute abnormality on 10/24 head CT. Failed extubation attempts and tracheostomy placed on 11/14. Overnight on 04/18/2024, patient noted to have eye deviation along with decreased responsiveness on physical exam.   Head CT reassuring.  MRI brain with diffuse cortical restricted diffusion greatest within the bilateral parieto-occipital regions, unclear etiology. 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 Neurology consulted.  EEG with evidence of seizure activity, therefore started on Keppra  and vimpat . Ammonia significantly elevated. Clinical status changed in the setting of missed lactulose  doses (due to copious stool output) - suspect possible hyperammonemia leading to seizures. Some improvement of mental status with resuming lactulose  and adequate stool output.   - Neurology following, appreciate recommendations  - Plan to obtain MRI brain in 2 weeks (~12/4)   - Continue Lactulose  30g TID, goal 500cc to 1000cc FMS output daily   - Rifaximin  550 mg twice daily  - Continue Keppra  750 mg twice daily (renally dosed)  - Continue Vimpat  100 mg twice daily  - Continue to monitor mental status  - Goals of care meeting next week with family ~ 12/3, palliative on board     Analgesia: No pain issues  RASS at goal? N/A, not on sedation  [place] Agitation Assessment Scale (RASS) : -1 (04/29/2024  5:04 PM)      Pulmonary     Chronic Hypoxic Hypercarbic Respiratory Failure   Rapid response called on 10/24 for increased somnolence, found to have acute hypercarbic respiratory failure and subsequently intubated. Suspect hypercarbic respiratory failure due to encephalopathy as above. CXR on 10/24 showed worsening pleural effusions. Given patient's hypoxia, and AMS, there was concern for infectious etiology.  Infectious workup overall negative. Unable to protect her airway due to her current mentation.  Reintubated on 11/2 followed by therapeutic bronchoscopy.  Self extubated on 11/5.  Reintubate noted on 11/6 due to hypercarbia and not protecting her airway.  Trach placed 11/14, complicated by anaphylactic reaction to rocuronium  which was treated with epi, benadryl  and steroids. Allergy added to chart.  First trach change completed on 04/20/2024 by ENT.  - Continue mechanical ventilation, weaning as tolerated  - Has not tolerated high flow trach collar, becomes tachypneic.    - VBG q12      Vent Mode: PRVC  FiO2 (%): 25 %  S RR: 12  S VT: 330 mL  PEEP: 5 cm H20    Cardiovascular   Carotid Artery Injury (resolved)  At [facility], CVC placed intended for RIJ on 10/24 following intubation, PEA, ROSC. Was used with known blood return. Unclear if it was used for pressor support, but highly likely that it was. CXR for placement check showed concern for intra aterial location, and blood gas confirmed it was arterial. Thought to be in carotid artery. CVC removed with pressure held for 15 minutes, with no hematoma formation. On arrival, no physical exam and bedside ultrasound showed no large hematoma. No active bleeding.  Vascular Surgery consulted, stated fistula has healed based on Ultrasound findings.  - CTM    PEA arrest s/p ROSC  Hypovolemic/Hemorrhagic shock (resolved)  Atrial fibrillation  known left atrial appendage clot  HFpEF  Patient with PEA arrest peri-intubation with ROSC. Shock thought to be hemorrhagic in the setting of GI bleed History of atrial fibrillation with known LAA clot and HFpEF. Vasopressors were weaned off 10/27. APTT was elevated the morning of 10/28 necessitating changing anticoagulation from therapeutic heparin  to therapeutic Lovenox  .   - Metoprolol  25 mg twice daily    Prolonged QTC  EKG 11/25 w/QTC of 605. Does not appear to be on Qtc prolonging medications and has not had changes to her regimen that point to reason behind this change. Of note, this EKG was of poor quality.   - Repeat EKG ordered, pending      Renal     Abnormal Electrolytes - Hypernatremia (resolved) - Hypokalemia (resolved) - Hypomagnesemia (resolved)  At [facility], had persistently hypernatremia near 153. Had been treated with D5 gtt. Sodium throughout admission has largely been 146-148. Sodium has overall normalized and continues to be within normal limits.  - Strict I/O's  - BMP daily  - Magnesium  and potassium repleted 11/24  - Good urine output  - Stable 11/27    Oliguria   On 11/26, patient produced 200 mL urine. Bladder scan showed 60 mL retained fluid. Urina Na <10 suggesting hypovolemia, s/p 500cc bolus.       Infectious Disease/Autoimmune     Hospital acquired pneumonia   Leukocytosis and increased thick tan secretions noted 11/27. CXR without focal pneumonia.  - MRSA screen   - Lrcx 11/27: GNR on gram stain   - continue Vanc/Cefe     E. Coli Bacteremia (resolved)  Initially admitted for afib, found to have E. coli bacteremia on October 10. This was treated with cefazolin . Rapid response called 10/24, patient noted to be hypothermic. WBCs jumped from 3.0 to 9.7 on 10/24. Although the most recent lab was following intubation and CPR. UA with pyuria, rare bacteria, hematuria. Peri-intubation, patient developed significant hypotension requiring initiation of NE and vasopressin . Suspect possible septic shock with unknown infectious source. Patient started on vancomycin  and cefepime  10/24. CT Abdomen pelvis concerning for aspiration pneumonia. Following infectious workup was unremarkable. Antibiotics were stopped on 10/27.  Infectious workup was repeated on 11/3 due to reintubation and status post bronchoscopy.  Completed course of ceftriaxone .  - CTM  Cultures:  Blood Culture, Routine (no units)   Date Value   04/17/2024 No Growth at 5 days   04/17/2024 No Growth at 5 days     Urine Culture, Comprehensive (no units)   Date Value   04/17/2024 NO GROWTH   04/09/2024 NO GROWTH     Lower Respiratory Culture (no units)   Date Value   04/28/2024 2+ Staphylococcus aureus (A)   04/28/2024 2+ Pseudomonas aeruginosa (A)     WBC (10*9/L)   Date Value   04/29/2024 12.9 (H)     WBC, UA (/HPF)   Date Value   04/17/2024 1       FEN/GI   Sigmoid Mass c/f Malignancy and Hematochezia (resolved)  Hemorrhagic Shock (resolved)  CT abdomen pelvis done 10/24 with evidence of cirrhosis and masslike thickening of the lower sigmoid colon concerning for malignancy.  CTA abdomen pelvis performed 1024 with active extravasation into the gastric fundus possibly secondary to traumatic enteric tube placement.  GI scoped the patient on 10/25 and clipped an area of active bleeding.  They removed a total of 1.5 L of blood.  Hemoglobin 10/20 6 AM downtrended to 6.0, so 2 units of blood transfused.  GI stated this was consistent with 10/25 scope and likely did not represent increased bleeding. Hb steadily improving with Hb of 9.2 on 10/28.   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 Given shock of unclear etiology. CTAP demonstrating cirrhosis. Likely that cirrhosis was contributing to hypotension.  Worsening altered mental status with seizure-like activity in the setting of hyperammonemia after missing 2-3 lactulose  doses due to copious stool output.  Evidence of seizure activity on EEG.  Mental status with slow improvement once lactulose  was resumed.  - Continue lactulose  as above  - Goal stool output 500 mL - 1L daily  - FMS in place  - MELD labs and CMP    Vitamin Deficiencies  Low vitamin D , copper , and zinc .  - Appreciate dietitian recommendations  - Continue tube feeds (currently at goal)  - Zinc  repletion  - Multivitamin 1 tablet daily  - Folic acid  1 mg daily  - Copper  2 mg IV 11/24  - Vitamin B12 1000 mcg daily  - Repeat Serum copper  pending     MELD 3.0: 15 at 04/05/2024  4:31 AM  MELD-Na: 13 at 04/05/2024  4:31 AM  Calculated from:  Serum Creatinine: 0.43 mg/dL (Using min of 1 mg/dL) at 88/09/7972  5:68 AM  Serum Sodium: 143 mmol/L (Using max of 137 mmol/L) at 04/05/2024  4:31 AM  Total Bilirubin: 1.3 mg/dL at 88/09/7972  5:68 AM  Serum Albumin : 2.4 g/dL at 88/09/7972  5:68 AM  INR(ratio): 1.61 at 04/03/2024 10:26 AM  Age at listing (hypothetical): 78 years  Sex: Female at 04/05/2024  4:31 AM    Provider Malnutrition Assessment:  Body mass index is 43.16 kg/m??. BMI Interpretation: >/= 30 and < 40, consistent with obesity, clinically significant requiring additional resources and complicating multiple aspects of patient care.  GLIM criteria:   Pt does not meet criteria  -I have screened this patient for malnutrition and they did NOT meet criteria for malnutrition based on GLIM criteria.  -TF and FWF; Dietician consulted, appreciate assistance      Heme/Coag  Acute blood loss anemia (Stable)  Hemoglobin down trended to 6.0 on the morning of 10/26 thought to be secondary to acute GI bleed that was scoped and clipped 10/25. Hemoglobin remains stable.   - CBC daily  - Maintain active T&S    Endocrine   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)   - home anastrozole     Integumentary     RLE Shin Wound  - WOCN consulted 11/26, appreciate assistance    Prophylaxis/LDA/Restraints/Consults   ICU Checklist completed: yes (see ICU rounding navigator in Epic)    Patient Lines/Drains/Airways Status       Active Active Lines, Drains, & Airways       Name Placement date Placement time Site Days    Tracheostomy Shiley 6 04/20/24  1430  6  9    NG/OG Tube Feedings 10 Fr. Right nostril 04/01/24  1138  Right nostril  28    External Urinary Device 04/13/24 With Suction 04/13/24  1700  -- 16    PICC Double Lumen 04/06/24 Left Basilic 04/06/24  1558  Basilic  23    Peripheral IV 88/79/74 Right Wrist 04/21/24  0830  Wrist  8                  Patient Lines/Drains/Airways Status       Active Wounds       Name Placement date Placement time Site Days    Wound 03/13/24 Irritant Contact Dermatitis Incontinence Sacrum Mid gluteal cleft MASD? 03/13/24  --  Sacrum  47    Wound 04/27/24 Traumatic Abrasion Pretibial Right 04/27/24  1500  Pretibial  2                  Goals of Care     Code Status:   Orders Placed This Encounter   Procedures    Full Code     Standing Status:   Standing     Number of Occurrences:   1        Designated Healthcare Decision Maker:  Ms. Kendra designated healthcare decision maker(s) is/are   HCDM (patient stated preference): Werts,[name] - Spouse - [phone]    HCDM (patient stated preference): Klecka,[name] - Daughter - [phone]. See HCDM section of Epic sidebar/storyboard or ACP tab in patient chart for details regarding active HCDMs and patient capacity for decision-making.      Subjective     Intubated.  Withdraws to pain. Opens eyes to voice. Opens eyes to stimulation, minimally interactive.  Unable to reliably follow commands.    Objective     Vitals - past 24 hours  Temp:  [36.1 ??C (97 ??F)-37.2 ??C (99 ??F)] 36.1 ??C (97 ??F)  Pulse:  [82-126] 113  SpO2 Pulse:  [84-129] 118  Resp:  [17-32] 21  BP: (91-171)/(48-102) 171/91  FiO2 (%):  [25 %] 25 %  SpO2:  [94 %-99 %] 97 % Intake/Output  I/O last 3 completed shifts:  In: 6613.3 [P.O.:120; I.V.:100; NG/GT:4890; IV Piggyback:1503.3]  Out: 4100 [Urine:1650; Stool:2450]     Physical Exam:   General: Intubated. Left hand in mitts. R leg restraint in place.   HEENT: normocephalic, atraumatic.  Trach in place.   CV: Regular rate and rhythm, no m/r/g  Pulm: Coarse breath sounds bilaterally anteriorly  GI: soft, does not appear tender; non-distended. + BS  MSK: 3+ pitting Bilateral LE edema (legs to feet); arms appear swollen, hips difficult to assess. No noted weeping  in left extremity. Wound on RLE now with bandage wrapped around it. Shiny, smooth skin in bilateral upper and lower extremities.   Skin: Moist skin breakdown around trach.  Neuro: Opens eyes to voice, does not follow commands, minimally moves upper extremities. Withdraws from noxious stimuli. Does not track interviewer.     Continuous Infusions:   Infusions Meds[1]    Scheduled Medications:   Scheduled Medications[2]    PRN medications:  PRN Medications[3]    Data/Imaging Review: Reviewed in Epic and personally interpreted on 04/29/2024. See EMR for detailed results.    Damien Mussel, MD  PGY-1, [facility]               [1] [2]    apixaban   5 mg Enteral tube: gastric BID    cefepime   2 g Intravenous Q12H    cyanocobalamin  (vitamin B-12)  1,000 mcg Enteral tube: gastric Daily    ergocalciferol   100 mcg Enteral tube: gastric Daily    esomeprazole   40 mg Enteral tube: gastric Daily    famotidine   20 mg Enteral tube: gastric Daily    flu vac 2025 [id number](PF)  0.5 mL Intramuscular During hospitalization    folic acid   1 mg Enteral tube: gastric Daily    [START ON 04/30/2024] furosemide   40 mg Intravenous BID    lacosamide   100 mg Enteral tube: gastric BID    lactulose   30 g Enteral tube: gastric TID    levETIRAcetam   750 mg Enteral tube: gastric BID    melatonin  3 mg Enteral tube: gastric QPM    metoPROLOL  tartrate  25 mg Enteral tube: gastric BID    multivitamins (ADULT)  1 tablet Enteral tube: gastric Daily    rifAXIMin   550 mg Enteral tube: gastric BID    sodium chloride   10 mL Intravenous Q8H    sodium chloride   10 mL Intravenous Q8H    thiamine  mononitrate (vit B1)  100 mg Enteral tube: gastric Daily    vancomycin   1,000 mg Intravenous Q12H    zinc  acetate  50 mg elem zinc  Enteral tube: gastric Daily   [3] acetaminophen 

## 2024-04-30 LAB — PHOSPHORUS: PHOSPHORUS: 2.7 mg/dL (ref 2.4–5.1)

## 2024-04-30 LAB — HEPATIC FUNCTION PANEL
ALBUMIN: 2 g/dL — ABNORMAL LOW (ref 3.4–5.0)
ALKALINE PHOSPHATASE: 198 U/L — ABNORMAL HIGH (ref 46–116)
ALT (SGPT): 12 U/L (ref 10–49)
AST (SGOT): 27 U/L (ref <=34)
BILIRUBIN DIRECT: 0.4 mg/dL — ABNORMAL HIGH (ref 0.00–0.30)
BILIRUBIN TOTAL: 0.8 mg/dL (ref 0.3–1.2)
PROTEIN TOTAL: 5.5 g/dL — ABNORMAL LOW (ref 5.7–8.2)

## 2024-04-30 LAB — CBC W/ AUTO DIFF
BASOPHILS ABSOLUTE COUNT: 0 10*9/L (ref 0.0–0.1)
BASOPHILS RELATIVE PERCENT: 0.6 %
EOSINOPHILS ABSOLUTE COUNT: 0.2 10*9/L (ref 0.0–0.5)
EOSINOPHILS RELATIVE PERCENT: 2.2 %
HEMATOCRIT: 25.3 % — ABNORMAL LOW (ref 34.0–44.0)
HEMOGLOBIN: 8.2 g/dL — ABNORMAL LOW (ref 11.3–14.9)
LYMPHOCYTES ABSOLUTE COUNT: 1.1 10*9/L (ref 1.1–3.6)
LYMPHOCYTES RELATIVE PERCENT: 14.1 %
MEAN CORPUSCULAR HEMOGLOBIN CONC: 32.4 g/dL (ref 32.0–36.0)
MEAN CORPUSCULAR HEMOGLOBIN: 30.6 pg (ref 25.9–32.4)
MEAN CORPUSCULAR VOLUME: 94.5 fL (ref 77.6–95.7)
MEAN PLATELET VOLUME: 9 fL (ref 6.8–10.7)
MONOCYTES ABSOLUTE COUNT: 1 10*9/L — ABNORMAL HIGH (ref 0.3–0.8)
MONOCYTES RELATIVE PERCENT: 12.5 %
NEUTROPHILS ABSOLUTE COUNT: 5.4 10*9/L (ref 1.8–7.8)
NEUTROPHILS RELATIVE PERCENT: 70.6 %
PLATELET COUNT: 165 10*9/L (ref 150–450)
RED BLOOD CELL COUNT: 2.67 10*12/L — ABNORMAL LOW (ref 3.95–5.13)
RED CELL DISTRIBUTION WIDTH: 18.5 % — ABNORMAL HIGH (ref 12.2–15.2)
WBC ADJUSTED: 7.7 10*9/L (ref 3.6–11.2)

## 2024-04-30 LAB — BASIC METABOLIC PANEL
ANION GAP: 8 mmol/L (ref 5–14)
BLOOD UREA NITROGEN: 18 mg/dL (ref 9–23)
BUN / CREAT RATIO: 45
CALCIUM: 8.2 mg/dL — ABNORMAL LOW (ref 8.7–10.4)
CHLORIDE: 104 mmol/L (ref 98–107)
CO2: 23 mmol/L (ref 20.0–31.0)
CREATININE: 0.4 mg/dL — ABNORMAL LOW (ref 0.55–1.02)
EGFR CKD-EPI (2021) FEMALE: >90 mL/min/1.73m2 (ref >=60)
GLUCOSE RANDOM: 131 mg/dL (ref 70–179)
POTASSIUM: 4.1 mmol/L (ref 3.4–4.8)
SODIUM: 135 mmol/L (ref 135–145)

## 2024-04-30 LAB — BLOOD GAS, VENOUS
BASE EXCESS VENOUS: -0.8 (ref -2.0–2.0)
CARBOXYHEMOGLOBIN, VENOUS: 1.9 % — ABNORMAL HIGH (ref <1.2)
HCO3 VENOUS: 23 mmol/L (ref 22–27)
METHEMOGLOBIN, VENOUS: <1 % (ref <1.5)
O2 SATURATION VENOUS: 65.6 % (ref 40.0–85.0)
OXYHEMOGLOBIN, VENOUS: 64 % (ref 40.0–85.0)
PCO2 VENOUS: 35 mmHg — ABNORMAL LOW (ref 40–60)
PH VENOUS: 7.43 (ref 7.32–7.43)
PO2 VENOUS: 37 mmHg (ref 30–55)

## 2024-04-30 LAB — MAGNESIUM: MAGNESIUM: 2 mg/dL (ref 1.6–2.6)

## 2024-04-30 MED ADMIN — sodium chloride (NS) 0.9 % flush 10 mL: 10 mL | INTRAVENOUS | @ 23:00:00

## 2024-04-30 MED ADMIN — sodium chloride (NS) 0.9 % flush 10 mL: 10 mL | INTRAVENOUS | @ 08:00:00

## 2024-04-30 MED ADMIN — sodium chloride (NS) 0.9 % flush 10 mL: 10 mL | INTRAVENOUS | @ 17:00:00

## 2024-04-30 MED ADMIN — rifAXIMin (XIFAXAN) oral suspension: 550 mg | GASTROENTERAL | @ 02:00:00 | Stop: 2025-04-11

## 2024-04-30 MED ADMIN — rifAXIMin (XIFAXAN) oral suspension: 550 mg | GASTROENTERAL | @ 14:00:00 | Stop: 2025-04-11

## 2024-04-30 MED ADMIN — lactulose oral solution: 30 g | GASTROENTERAL | @ 14:00:00

## 2024-04-30 MED ADMIN — lactulose oral solution: 30 g | GASTROENTERAL | @ 18:00:00

## 2024-04-30 MED ADMIN — lactulose oral solution: 30 g | GASTROENTERAL | @ 02:00:00

## 2024-04-30 MED ADMIN — folic acid (FOLVITE) tablet 1 mg: 1 mg | GASTROENTERAL | @ 14:00:00

## 2024-04-30 MED ADMIN — metoPROLOL tartrate (Lopressor) tablet 25 mg: 25 mg | GASTROENTERAL | @ 02:00:00

## 2024-04-30 MED ADMIN — metoPROLOL tartrate (Lopressor) tablet 25 mg: 25 mg | GASTROENTERAL | @ 14:00:00

## 2024-04-30 MED ADMIN — thiamine mononitrate (vit B1) tablet 100 mg: 100 mg | GASTROENTERAL | @ 14:00:00

## 2024-04-30 MED ADMIN — ergocalciferol (DRISDOL) oral drops 200 mcg/mL (8,000 unit/mL): 100 ug | GASTROENTERAL | @ 14:00:00 | Stop: 2024-05-21

## 2024-04-30 MED ADMIN — cyanocobalamin (vitamin B-12) tablet 1,000 mcg: 1000 ug | GASTROENTERAL | @ 14:00:00

## 2024-04-30 MED ADMIN — levETIRAcetam (KEPPRA) tablet 750 mg: 750 mg | GASTROENTERAL | @ 14:00:00

## 2024-04-30 MED ADMIN — levETIRAcetam (KEPPRA) tablet 750 mg: 750 mg | GASTROENTERAL | @ 02:00:00

## 2024-04-30 MED ADMIN — famotidine (PEPCID) tablet 20 mg: 20 mg | GASTROENTERAL | @ 14:00:00

## 2024-04-30 MED ADMIN — melatonin tablet 3 mg: 3 mg | GASTROENTERAL | @ 23:00:00

## 2024-04-30 MED ADMIN — apixaban (ELIQUIS) tablet 5 mg: 5 mg | GASTROENTERAL | @ 14:00:00

## 2024-04-30 MED ADMIN — apixaban (ELIQUIS) tablet 5 mg: 5 mg | GASTROENTERAL | @ 02:00:00

## 2024-04-30 MED ADMIN — cefepime (MAXIPIME) 2 g in sodium chloride 0.9 % (NS) 100 mL IVPB-MBP: 2 g | INTRAVENOUS | @ 10:00:00 | Stop: 2024-05-04

## 2024-04-30 MED ADMIN — cefepime (MAXIPIME) 2 g in sodium chloride 0.9 % (NS) 100 mL IVPB-MBP: 2 g | INTRAVENOUS | @ 22:00:00 | Stop: 2024-05-04

## 2024-04-30 MED ADMIN — zinc acetate oral solution: 50 mg | GASTROENTERAL | @ 23:00:00 | Stop: 2024-05-06

## 2024-04-30 MED ADMIN — esomeprazole (NEXIUM) granules 40 mg: 40 mg | GASTROENTERAL | @ 14:00:00

## 2024-04-30 MED ADMIN — multivitamins, therapeutic with minerals tablet 1 tablet: 1 | GASTROENTERAL | @ 14:00:00

## 2024-04-30 MED ADMIN — lacosamide (VIMPAT) tablet 100 mg: 100 mg | GASTROENTERAL | @ 02:00:00

## 2024-04-30 MED ADMIN — lacosamide (VIMPAT) tablet 100 mg: 100 mg | GASTROENTERAL | @ 14:00:00

## 2024-04-30 MED ADMIN — vancomycin (VANCOCIN) IVPB 1000 mg (premix): 1000 mg | INTRAVENOUS | @ 01:00:00

## 2024-04-30 MED ADMIN — vancomycin (VANCOCIN) IVPB 1000 mg (premix): 1000 mg | INTRAVENOUS | @ 14:00:00 | Stop: 2024-04-30

## 2024-04-30 MED ADMIN — furosemide (LASIX) injection 40 mg: 40 mg | INTRAVENOUS | @ 11:00:00

## 2024-04-30 MED ADMIN — furosemide (LASIX) injection 40 mg: 40 mg | INTRAVENOUS | @ 18:00:00

## 2024-04-30 NOTE — Plan of Care (Signed)
 Problem: Skin Injury Risk Increased  Goal: Skin Health and Integrity  Intervention: Optimize Skin Protection  Recent Flowsheet Documentation  Taken 04/29/2024 2037 by Pearson Bolk, RRT  Head of Bed [facility]) Positioning: HOB at 30-45 degrees     Problem: Breathing Pattern Ineffective  Goal: Effective Breathing Pattern  Intervention: Promote Improved Breathing Pattern  Recent Flowsheet Documentation  Taken 04/30/2024 0211 by Pearson Bolk, RRT  Airway/Ventilation Management:   airway patency maintained   humidification applied   pulmonary hygiene promoted  Taken 04/29/2024 2037 by Pearson Bolk, RRT  Head of Bed [facility]) Positioning: HOB at 30-45 degrees  Airway/Ventilation Management:   airway patency maintained   humidification applied   pulmonary hygiene promoted     Problem: Gas Exchange Impaired  Goal: Optimal Gas Exchange  Intervention: Optimize Oxygenation and Ventilation  Recent Flowsheet Documentation  Taken 04/30/2024 0211 by Pearson Bolk, RRT  Airway/Ventilation Management:   airway patency maintained   humidification applied   pulmonary hygiene promoted  Taken 04/29/2024 2037 by Pearson Bolk, RRT  Head of Bed [facility]) Positioning: HOB at 30-45 degrees  Airway/Ventilation Management:   airway patency maintained   humidification applied   pulmonary hygiene promoted     Problem: Wound  Goal: Skin Health and Integrity  Intervention: Optimize Skin Protection  Recent Flowsheet Documentation  Taken 04/29/2024 2037 by Pearson Bolk, RRT  Head of Bed [facility]) Positioning: HOB at 30-45 degrees     Problem: Mechanical Ventilation Invasive  Goal: Optimal Device Function  Intervention: Optimize Device Care and Function  Recent Flowsheet Documentation  Taken 04/30/2024 0211 by Pearson Bolk, RRT  Airway/Ventilation Management:   airway patency maintained   humidification applied   pulmonary hygiene promoted  Taken 04/29/2024 2037 by Pearson Bolk, RRT  Airway/Ventilation Management:   airway patency maintained   humidification applied   pulmonary hygiene promoted  Oral Care:   oral rinse provided   suction provided   tongue brushed  Goal: Absence of Ventilator-Induced Lung Injury  Intervention: Prevent Ventilator-Associated Pneumonia  Recent Flowsheet Documentation  Taken 04/30/2024 0211 by Pearson Bolk, RRT  VAP Prevention Bundle:   HOB elevation maintained   oral care regularly provided   vent circuit breaks minimized  Taken 04/29/2024 2037 by Pearson Bolk, RRT  Head of Bed [facility]) Positioning: HOB at 30-45 degrees  VAP Prevention Bundle:   HOB elevation maintained   oral care regularly provided   vent circuit breaks minimized  Oral Care:   oral rinse provided   suction provided   tongue brushed     Problem: Mechanical Ventilation Invasive  Goal: Optimal Device Function  Intervention: Optimize Device Care and Function  Recent Flowsheet Documentation  Taken 04/30/2024 0211 by Pearson Bolk, RRT  Airway/Ventilation Management:   airway patency maintained   humidification applied   pulmonary hygiene promoted  Taken 04/29/2024 2037 by Pearson Bolk, RRT  Airway/Ventilation Management:   airway patency maintained   humidification applied   pulmonary hygiene promoted  Oral Care:   oral rinse provided   suction provided   tongue brushed  Goal: Absence of Ventilator-Induced Lung Injury  Intervention: Prevent Ventilator-Associated Pneumonia  Recent Flowsheet Documentation  Taken 04/30/2024 0211 by Pearson Bolk, RRT  VAP Prevention Bundle:   HOB elevation maintained   oral care regularly provided   vent circuit breaks minimized  Taken 04/29/2024 2037 by Pearson Bolk, RRT  Head of Bed [facility]) Positioning: HOB at 30-45 degrees  VAP Prevention Bundle:   HOB elevation maintained   oral care regularly  provided   vent circuit breaks minimized  Oral Care:   oral rinse provided   suction provided   tongue brushed     Problem: Mechanical Ventilation Invasive  Goal: Optimal Device Function  Intervention: Optimize Device Care and Function  Recent Flowsheet Documentation  Taken 04/30/2024 0211 by Pearson Bolk, RRT  Airway/Ventilation Management:   airway patency maintained   humidification applied   pulmonary hygiene promoted  Taken 04/29/2024 2037 by Pearson Bolk, RRT  Airway/Ventilation Management:   airway patency maintained   humidification applied   pulmonary hygiene promoted  Oral Care:   oral rinse provided   suction provided   tongue brushed  Goal: Absence of Ventilator-Induced Lung Injury  Intervention: Prevent Ventilator-Associated Pneumonia  Recent Flowsheet Documentation  Taken 04/30/2024 0211 by Pearson Bolk, RRT  VAP Prevention Bundle:   HOB elevation maintained   oral care regularly provided   vent circuit breaks minimized  Taken 04/29/2024 2037 by Pearson Bolk, RRT  Head of Bed [facility]) Positioning: HOB at 30-45 degrees  VAP Prevention Bundle:   HOB elevation maintained   oral care regularly provided   vent circuit breaks minimized  Oral Care:   oral rinse provided   suction provided   tongue brushed     Problem: Mechanical Ventilation Invasive  Goal: Optimal Device Function  Intervention: Optimize Device Care and Function  Recent Flowsheet Documentation  Taken 04/30/2024 0211 by Pearson Bolk, RRT  Airway/Ventilation Management:   airway patency maintained   humidification applied   pulmonary hygiene promoted  Taken 04/29/2024 2037 by Pearson Bolk, RRT  Airway/Ventilation Management:   airway patency maintained   humidification applied   pulmonary hygiene promoted  Oral Care:   oral rinse provided   suction provided   tongue brushed  Goal: Absence of Ventilator-Induced Lung Injury  Intervention: Prevent Ventilator-Associated Pneumonia  Recent Flowsheet Documentation  Taken 04/30/2024 0211 by Pearson Bolk, RRT  VAP Prevention Bundle:   HOB elevation maintained   oral care regularly provided   vent circuit breaks minimized  Taken 04/29/2024 2037 by Pearson Bolk, RRT  Head of Bed [facility]) Positioning: HOB at 30-45 degrees  VAP Prevention Bundle:   HOB elevation maintained   oral care regularly provided   vent circuit breaks minimized  Oral Care:   oral rinse provided   suction provided   tongue brushed     Problem: Mechanical Ventilation Invasive  Goal: Optimal Device Function  Intervention: Optimize Device Care and Function  Recent Flowsheet Documentation  Taken 04/30/2024 0211 by Pearson Bolk, RRT  Airway/Ventilation Management:   airway patency maintained   humidification applied   pulmonary hygiene promoted  Taken 04/29/2024 2037 by Pearson Bolk, RRT  Airway/Ventilation Management:   airway patency maintained   humidification applied   pulmonary hygiene promoted  Oral Care:   oral rinse provided   suction provided   tongue brushed  Goal: Absence of Ventilator-Induced Lung Injury  Intervention: Prevent Ventilator-Associated Pneumonia  Recent Flowsheet Documentation  Taken 04/30/2024 0211 by Pearson Bolk, RRT  VAP Prevention Bundle:   HOB elevation maintained   oral care regularly provided   vent circuit breaks minimized  Taken 04/29/2024 2037 by Pearson Bolk, RRT  Head of Bed [facility]) Positioning: HOB at 30-45 degrees  VAP Prevention Bundle:   HOB elevation maintained   oral care regularly provided   vent circuit breaks minimized  Oral Care:   oral rinse provided   suction provided   tongue brushed     Problem: Mechanical Ventilation  Invasive  Goal: Optimal Device Function  Intervention: Optimize Device Care and Function  Recent Flowsheet Documentation  Taken 04/30/2024 0211 by Pearson Bolk, RRT  Airway/Ventilation Management:   airway patency maintained   humidification applied   pulmonary hygiene promoted  Taken 04/29/2024 2037 by Pearson Bolk, RRT  Airway/Ventilation Management:   airway patency maintained   humidification applied   pulmonary hygiene promoted  Oral Care:   oral rinse provided   suction provided   tongue brushed  Goal: Absence of Ventilator-Induced Lung Injury  Intervention: Prevent Ventilator-Associated Pneumonia  Recent Flowsheet Documentation  Taken 04/30/2024 0211 by Pearson Bolk, RRT  VAP Prevention Bundle:   HOB elevation maintained   oral care regularly provided   vent circuit breaks minimized  Taken 04/29/2024 2037 by Pearson Bolk, RRT  Head of Bed [facility]) Positioning: HOB at 30-45 degrees  VAP Prevention Bundle:   HOB elevation maintained   oral care regularly provided   vent circuit breaks minimized  Oral Care:   oral rinse provided   suction provided   tongue brushed     Problem: Artificial Airway  Goal: Optimal Device Function  Intervention: Optimize Device Care and Function  Recent Flowsheet Documentation  Taken 04/30/2024 0211 by Pearson Bolk, RRT  Airway/Ventilation Management:   airway patency maintained   humidification applied   pulmonary hygiene promoted  Taken 04/29/2024 2037 by Pearson Bolk, RRT  Airway/Ventilation Management:   airway patency maintained   humidification applied   pulmonary hygiene promoted  Oral Care:   oral rinse provided   suction provided   tongue brushed     Problem: Mechanical Ventilation Invasive  Goal: Optimal Device Function  Intervention: Optimize Device Care and Function  Recent Flowsheet Documentation  Taken 04/30/2024 0211 by Pearson Bolk, RRT  Airway/Ventilation Management:   airway patency maintained   humidification applied   pulmonary hygiene promoted  Taken 04/29/2024 2037 by Pearson Bolk, RRT  Airway/Ventilation Management:   airway patency maintained   humidification applied   pulmonary hygiene promoted  Oral Care:   oral rinse provided   suction provided   tongue brushed  Goal: Absence of Ventilator-Induced Lung Injury  Intervention: Prevent Ventilator-Associated Pneumonia  Recent Flowsheet Documentation  Taken 04/30/2024 0211 by Pearson Bolk, RRT  VAP Prevention Bundle:   HOB elevation maintained   oral care regularly provided   vent circuit breaks minimized  Taken 04/29/2024 2037 by Pearson Bolk, RRT  Head of Bed [facility]) Positioning: HOB at 30-45 degrees  VAP Prevention Bundle:   HOB elevation maintained   oral care regularly provided   vent circuit breaks minimized  Oral Care:   oral rinse provided   suction provided   tongue brushed  Patient stable throughout shift on PRVC settings.  [name] is patent, secure, no skin breakdown noted in peri-stomal area.  ETS fo moderate, thick, yellow secretions. Plan to resume PSV during day as tolerated.

## 2024-04-30 NOTE — Plan of Care (Signed)
 Shift Summary  vancomycin  was administered early in the shift for bacteremia.   Nutren 2.0 tube feedings were provided at scheduled intervals to support nutritional needs.   Multiple respiratory alarms occurred, including high respiratory rate and apnea, with airway suction performed and ventilator support maintained.   Skin integrity was supported through regular repositioning and use of a specialty bed, with no new injuries noted.   Overall, safety interventions and fall prevention strategies were consistently maintained, and no restraint or physical injury events were documented.     Skin Health and Integrity: Bruising and redness were consistently noted throughout the shift, with regular repositioning and pressure reduction techniques applied to support skin integrity. Braden Scale score remained at 12, and specialty bed was utilized for bariatric safety.     Effective Breathing Pattern: Respiratory rate fluctuated, with periods of increased rate and apnea alarms, but ventilator control was maintained and airway patency supported with suction and pulmonary hygiene. Bilateral breath sounds remained diminished with rhonchi noted.     Absence of Fall and Fall-Related Injury: Fall reduction interventions were maintained, including bed alarms, hourly visual checks, and toileting every two hours, with no documented fall or injury events.     Patient will remain free of restraint events: Restraints were monitored and less restrictive alternatives such as repositioning and 1:1 care were consistently implemented, with no restraint-related events documented.     Patient will remain free of physical injury: No new physical injuries were documented during the shift, and safety interventions were maintained.

## 2024-04-30 NOTE — Plan of Care (Signed)
 Shift Summary  Patient is intermittently drowsy, unable to assess orientation status at this time. Spontaneously open eyes, was noted tracking with RN x2 today. Non-purposeful movements noted to the BUE. VSS, O2 sats >90% on vent. Afebrile. Scoring a 0 on NAPS scale. UO adequate via purewick. FMS in place. Nutren 2.0  running at goal through corpak. Skin weeps and extremities all with pitting edema. Bath and CHG given. Dressing to RLE changed. Q2H turns and standard precautions maintained. Daughter updated via phone. All monitors with appopriate alarm settings, see flowsheets for further information.     Skin Health and Integrity: Bruising and redness were noted at midday, with frequent repositioning and pressure reduction techniques used throughout the shift to support skin integrity. Tracheostomy site was cleansed, dried, and dressing changed in the morning, with site remaining clean and dry later in the shift.     Optimal Comfort and Wellbeing: NAPS pain scores remained at 0 with relaxed facial expression and muscle tone, and no abnormal vocalizations were observed. Comfort was maintained without evidence of distress.     Optimal Gas Exchange: Ventilator support continued with stable FiO2 and SpO2 values, and respiratory rate decreased from morning to afternoon. Airway patency was maintained with regular suctioning and tracheostomy care.     Patient will remain free of physical injury: Bed was kept in the lowest position with wheels locked and side rails up, and positioning devices were used during transfers. Hourly visual checks confirmed safety, and call light remained out of reach due to inability to use.     Absence of Infection Signs and Symptoms: Sepsis risk score decreased significantly in the morning and remained low throughout the shift, and highest temperature was within normal range. Tracheostomy site transitioned from drainage to clean and dry after care.         Problem: Adult Inpatient Plan of Care  Goal: Absence of Hospital-Acquired Illness or Injury  Intervention: Identify and Manage Fall Risk  Recent Flowsheet Documentation  Taken 04/30/2024 0800 by Gladis Andrez HERO, RN  Safety Interventions:   aspiration precautions   bed alarm   environmental modification   fall reduction program maintained   lighting adjusted for tasks/safety   low bed   nonskid shoes/slippers when out of bed   room near unit station   supervised activity   toileting scheduled   bariatric safety   enteral feeding safety   commode/urinal/bedpan at bedside   infection management  Intervention: Prevent Skin Injury  Recent Flowsheet Documentation  Taken 04/30/2024 1800 by Gladis Andrez HERO, RN  Positioning for Skin: Left  Taken 04/30/2024 1600 by Gladis Andrez HERO, RN  Positioning for Skin: Right  Taken 04/30/2024 1400 by Gladis Andrez HERO, RN  Positioning for Skin: Left  Taken 04/30/2024 1200 by Gladis Andrez HERO, RN  Positioning for Skin: Right  Taken 04/30/2024 1000 by Gladis Andrez HERO, RN  Positioning for Skin: Left  Taken 04/30/2024 0800 by Gladis Andrez HERO, RN  Positioning for Skin: Left  Device Skin Pressure Protection:   absorbent pad utilized/changed   adhesive use limited   positioning supports utilized   pressure points protected   tubing/devices free from skin contact  Skin Protection:   adhesive use limited   incontinence pads utilized  Taken 04/30/2024 0745 by Gladis Andrez HERO, RN  Positioning for Skin: Left  Device Skin Pressure Protection:   absorbent pad utilized/changed   adhesive use limited   positioning supports utilized   pressure points protected   tubing/devices free  from skin contact  Skin Protection:   adhesive use limited   incontinence pads utilized  Intervention: Prevent Infection  Recent Flowsheet Documentation  Taken 04/30/2024 0800 by Gladis Andrez HERO, RN  Infection Prevention:   cohorting utilized   environmental surveillance performed   equipment surfaces disinfected   hand hygiene promoted   personal protective equipment utilized   rest/sleep promoted   single patient room provided   visitors restricted/screened     Problem: Breathing Pattern Ineffective  Goal: Effective Breathing Pattern  Intervention: Promote Improved Breathing Pattern  Recent Flowsheet Documentation  Taken 04/30/2024 1800 by Gladis Andrez HERO, RN  Head of Bed [facility]) Positioning: HOB at 30-45 degrees  Taken 04/30/2024 1600 by Gladis Andrez HERO, RN  Head of Bed [facility]) Positioning: HOB at 45 degrees  Taken 04/30/2024 1400 by Gladis Andrez HERO, RN  Head of Bed [facility]) Positioning: HOB at 30-45 degrees  Taken 04/30/2024 1200 by Gladis Andrez HERO, RN  Head of Bed [facility]) Positioning: HOB at 30-45 degrees  Taken 04/30/2024 1000 by Gladis Andrez HERO, RN  Head of Bed [facility]) Positioning: HOB at 30-45 degrees  Taken 04/30/2024 0800 by Gladis Andrez HERO, RN  Head of Bed [facility]) Positioning: HOB at 30-45 degrees     Problem: Fall Injury Risk  Goal: Absence of Fall and Fall-Related Injury  Intervention: Promote Injury-Free Environment  Recent Flowsheet Documentation  Taken 04/30/2024 0800 by Gladis Andrez HERO, RN  Safety Interventions:   aspiration precautions   bed alarm   environmental modification   fall reduction program maintained   lighting adjusted for tasks/safety   low bed   nonskid shoes/slippers when out of bed   room near unit station   supervised activity   toileting scheduled   bariatric safety   enteral feeding safety   commode/urinal/bedpan at bedside   infection management     Problem: Non-Violent Restraints  Intervention: Utilize least restrictive measures  Recent Flowsheet Documentation  Taken 04/30/2024 1800 by Gladis Andrez HERO, RN  Less Restrictive Alternative:   Repositioning   1:1 patient care   Re-evaluate equipment/device protection   Pain management   Alarm  Taken 04/30/2024 1600 by Gladis Andrez HERO, RN  Less Restrictive Alternative:   Repositioning   1:1 patient care   Re-evaluate equipment/device protection   Pain management   Alarm  Taken 04/30/2024 1400 by Gladis Andrez HERO, RN  Less Restrictive Alternative:   Repositioning   1:1 patient care   Re-evaluate equipment/device protection   Pain management   Alarm  Taken 04/30/2024 1200 by Gladis Andrez HERO, RN  Less Restrictive Alternative:   Repositioning   1:1 patient care   Re-evaluate equipment/device protection   Pain management   Alarm  Taken 04/30/2024 1000 by Gladis Andrez HERO, RN  Less Restrictive Alternative:   Repositioning   1:1 patient care   Re-evaluate equipment/device protection   Pain management   Alarm  Taken 04/30/2024 0800 by Gladis Andrez HERO, RN  Less Restrictive Alternative:   Repositioning   1:1 patient care   Re-evaluate equipment/device protection   Alarm   Pain management  Intervention: Patient Monitoring  Recent Flowsheet Documentation  Taken 04/30/2024 1800 by Gladis Andrez HERO, RN  Psychological Status/Visual Check: Subdued  Circulation/Skin Integrity: No signs of injury  Range of Motion: Performed  Fluids: NPO  Food/Meal: Enteral feeding/TPN  Elimination: Rectal tube  Taken 04/30/2024 1600 by Gladis Andrez HERO, RN  Psychological Status/Visual Check: Subdued  Circulation/Skin Integrity: No signs of injury  Range  of Motion: Performed  Fluids: NPO  Food/Meal: Enteral feeding/TPN  Elimination: Rectal tube  Taken 04/30/2024 1400 by Gladis Andrez HERO, RN  Psychological Status/Visual Check: Subdued  Circulation/Skin Integrity: No signs of injury  Range of Motion: Performed  Fluids: NPO  Food/Meal: Enteral feeding/TPN  Elimination: Rectal tube  Taken 04/30/2024 1200 by Gladis Andrez HERO, RN  Psychological Status/Visual Check: Subdued  Circulation/Skin Integrity: No signs of injury  Range of Motion: Performed  Fluids: NPO  Food/Meal: Enteral feeding/TPN  Elimination: Rectal tube  Taken 04/30/2024 1000 by Gladis Andrez HERO, RN  Psychological Status/Visual Check: Agitated/restless  Circulation/Skin Integrity: No signs of injury  Range of Motion: Performed  Fluids: NPO  Food/Meal: Enteral feeding/TPN  Elimination: Rectal tube  Taken 04/30/2024 0800 by Gladis Andrez HERO, RN  Psychological Status/Visual Check: Subdued  Circulation/Skin Integrity: No signs of injury  Range of Motion: Performed  Fluids: NPO  Food/Meal: Enteral feeding/TPN  Elimination: Rectal tube  Intervention: Patient Education  Recent Flowsheet Documentation  Taken 04/30/2024 0800 by Gladis Andrez HERO, RN  Criteria Explained: Yes  Patient's Response: No evidence of learning     Problem: Wound  Goal: Absence of Infection Signs and Symptoms  Intervention: Prevent or Manage Infection  Recent Flowsheet Documentation  Taken 04/30/2024 0800 by Gladis Andrez HERO, RN  Infection Management: aseptic technique maintained  Goal: Skin Health and Integrity  Intervention: Optimize Skin Protection  Recent Flowsheet Documentation  Taken 04/30/2024 1800 by Gladis Andrez HERO, RN  Head of Bed [facility]) Positioning: HOB at 30-45 degrees  Taken 04/30/2024 1600 by Gladis Andrez HERO, RN  Head of Bed [facility]) Positioning: HOB at 45 degrees  Taken 04/30/2024 1400 by Gladis Andrez HERO, RN  Head of Bed [facility]) Positioning: HOB at 30-45 degrees  Taken 04/30/2024 1200 by Gladis Andrez HERO, RN  Head of Bed [facility]) Positioning: HOB at 30-45 degrees  Taken 04/30/2024 1000 by Gladis Andrez HERO, RN  Head of Bed [facility]) Positioning: HOB at 30-45 degrees  Taken 04/30/2024 0800 by Gladis Andrez HERO, RN  Pressure Reduction Techniques:   frequent weight shift encouraged   heels elevated off bed  Head of Bed (HOB) Positioning: HOB at 30-45 degrees  Pressure Reduction Devices:   heel offloading device utilized   positioning supports utilized  Skin Protection:   adhesive use limited   incontinence pads utilized  Taken 04/30/2024 0745 by Gladis Andrez HERO, RN  Pressure Reduction Techniques:   frequent weight shift encouraged   heels elevated off bed  Pressure Reduction Devices:   heel offloading device utilized   positioning supports utilized  Skin Protection:   adhesive use limited   incontinence pads utilized     Problem: Mechanical Ventilation Invasive  Goal: Optimal Device Function  Intervention: Optimize Device Care and Function  Recent Flowsheet Documentation  Taken 04/30/2024 1800 by Gladis Andrez HERO, RN  Oral Care:   suction provided   mouth swabbed  Taken 04/30/2024 1600 by Gladis Andrez HERO, RN  Oral Care:   suction provided   mouth swabbed  Taken 04/30/2024 1400 by Gladis Andrez HERO, RN  Oral Care:   mouth swabbed   teeth brushed   suction provided  Taken 04/30/2024 1200 by Gladis Andrez HERO, RN  Oral Care:   suction provided   teeth brushed   mouth swabbed  Taken 04/30/2024 1000 by Gladis Andrez HERO, RN  Oral Care:   suction provided   teeth brushed   mouth swabbed  Taken 04/30/2024 0800 by Gladis Andrez HERO, RN  Oral Care:   suction provided   mouth swabbed   tongue brushed  Goal: Absence of Device-Related Skin and Tissue Injury  Intervention: Maintain Skin and Tissue Health  Recent Flowsheet Documentation  Taken 04/30/2024 0800 by Gladis Andrez HERO, RN  Device Skin Pressure Protection:   absorbent pad utilized/changed   adhesive use limited   positioning supports utilized   pressure points protected   tubing/devices free from skin contact  Taken 04/30/2024 0745 by Gladis Andrez HERO, RN  Device Skin Pressure Protection:   absorbent pad utilized/changed   adhesive use limited   positioning supports utilized   pressure points protected   tubing/devices free from skin contact  Goal: Absence of Ventilator-Induced Lung Injury  Intervention: Prevent Ventilator-Associated Pneumonia  Recent Flowsheet Documentation  Taken 04/30/2024 1800 by Gladis Andrez HERO, RN  Head of Bed [facility]) Positioning: HOB at 30-45 degrees  Oral Care:   suction provided   mouth swabbed  Taken 04/30/2024 1600 by Gladis Andrez HERO, RN  Head of Bed [facility]) Positioning: HOB at 45 degrees  Oral Care:   suction provided   mouth swabbed  Taken 04/30/2024 1400 by Gladis Andrez HERO, RN  Head of Bed [facility]) Positioning: HOB at 30-45 degrees  Oral Care:   mouth swabbed   teeth brushed   suction provided  Taken 04/30/2024 1200 by Gladis Andrez HERO, RN  Head of Bed [facility]) Positioning: HOB at 30-45 degrees  Oral Care:   suction provided   teeth brushed   mouth swabbed  Taken 04/30/2024 1000 by Gladis Andrez HERO, RN  Head of Bed [facility]) Positioning: HOB at 30-45 degrees  Oral Care:   suction provided   teeth brushed   mouth swabbed  Taken 04/30/2024 0800 by Gladis Andrez HERO, RN  Head of Bed [facility]) Positioning: HOB at 30-45 degrees  Oral Care:   suction provided   mouth swabbed   tongue brushed     Problem: Mechanical Ventilation Invasive  Goal: Optimal Device Function  Intervention: Optimize Device Care and Function  Recent Flowsheet Documentation  Taken 04/30/2024 1800 by Gladis Andrez HERO, RN  Oral Care:   suction provided   mouth swabbed  Taken 04/30/2024 1600 by Gladis Andrez HERO, RN  Oral Care:   suction provided   mouth swabbed  Taken 04/30/2024 1400 by Gladis Andrez HERO, RN  Oral Care:   mouth swabbed   teeth brushed   suction provided  Taken 04/30/2024 1200 by Gladis Andrez HERO, RN  Oral Care:   suction provided   teeth brushed   mouth swabbed  Taken 04/30/2024 1000 by Gladis Andrez HERO, RN  Oral Care:   suction provided   teeth brushed   mouth swabbed  Taken 04/30/2024 0800 by Gladis Andrez HERO, RN  Oral Care:   suction provided   mouth swabbed   tongue brushed  Goal: Absence of Device-Related Skin and Tissue Injury  Intervention: Maintain Skin and Tissue Health  Recent Flowsheet Documentation  Taken 04/30/2024 0800 by Gladis Andrez HERO, RN  Device Skin Pressure Protection:   absorbent pad utilized/changed   adhesive use limited   positioning supports utilized   pressure points protected   tubing/devices free from skin contact  Taken 04/30/2024 0745 by Gladis Andrez HERO, RN  Device Skin Pressure Protection:   absorbent pad utilized/changed   adhesive use limited   positioning supports utilized   pressure points protected   tubing/devices free from skin contact  Goal: Absence of Ventilator-Induced Lung Injury  Intervention: Prevent  Ventilator-Associated Pneumonia  Recent Flowsheet Documentation  Taken 04/30/2024 1800 by Gladis Andrez HERO, RN  Head of Bed [facility]) Positioning: HOB at 30-45 degrees  Oral Care:   suction provided   mouth swabbed  Taken 04/30/2024 1600 by Gladis Andrez HERO, RN  Head of Bed [facility]) Positioning: HOB at 45 degrees  Oral Care:   suction provided   mouth swabbed  Taken 04/30/2024 1400 by Gladis Andrez HERO, RN  Head of Bed [facility]) Positioning: HOB at 30-45 degrees  Oral Care:   mouth swabbed   teeth brushed   suction provided  Taken 04/30/2024 1200 by Gladis Andrez HERO, RN  Head of Bed [facility]) Positioning: HOB at 30-45 degrees  Oral Care:   suction provided   teeth brushed   mouth swabbed  Taken 04/30/2024 1000 by Gladis Andrez HERO, RN  Head of Bed [facility]) Positioning: HOB at 30-45 degrees  Oral Care:   suction provided   teeth brushed   mouth swabbed  Taken 04/30/2024 0800 by Gladis Andrez HERO, RN  Head of Bed [facility]) Positioning: HOB at 30-45 degrees  Oral Care:   suction provided   mouth swabbed   tongue brushed     Problem: Mechanical Ventilation Invasive  Goal: Optimal Device Function  Intervention: Optimize Device Care and Function  Recent Flowsheet Documentation  Taken 04/30/2024 1800 by Gladis Andrez HERO, RN  Oral Care:   suction provided   mouth swabbed  Taken 04/30/2024 1600 by Gladis Andrez HERO, RN  Oral Care:   suction provided   mouth swabbed  Taken 04/30/2024 1400 by Gladis Andrez HERO, RN  Oral Care:   mouth swabbed   teeth brushed   suction provided  Taken 04/30/2024 1200 by Gladis Andrez HERO, RN  Oral Care:   suction provided   teeth brushed   mouth swabbed  Taken 04/30/2024 1000 by Gladis Andrez HERO, RN  Oral Care:   suction provided   teeth brushed   mouth swabbed  Taken 04/30/2024 0800 by Gladis Andrez HERO, RN  Oral Care:   suction provided   mouth swabbed   tongue brushed  Goal: Absence of Device-Related Skin and Tissue Injury  Intervention: Maintain Skin and Tissue Health  Recent Flowsheet Documentation  Taken 04/30/2024 0800 by Gladis Andrez HERO, RN  Device Skin Pressure Protection:   absorbent pad utilized/changed   adhesive use limited   positioning supports utilized   pressure points protected   tubing/devices free from skin contact  Taken 04/30/2024 0745 by Gladis Andrez HERO, RN  Device Skin Pressure Protection:   absorbent pad utilized/changed   adhesive use limited   positioning supports utilized   pressure points protected   tubing/devices free from skin contact  Goal: Absence of Ventilator-Induced Lung Injury  Intervention: Prevent Ventilator-Associated Pneumonia  Recent Flowsheet Documentation  Taken 04/30/2024 1800 by Gladis Andrez HERO, RN  Head of Bed [facility]) Positioning: HOB at 30-45 degrees  Oral Care:   suction provided   mouth swabbed  Taken 04/30/2024 1600 by Gladis Andrez HERO, RN  Head of Bed [facility]) Positioning: HOB at 45 degrees  Oral Care:   suction provided   mouth swabbed  Taken 04/30/2024 1400 by Gladis Andrez HERO, RN  Head of Bed [facility]) Positioning: HOB at 30-45 degrees  Oral Care:   mouth swabbed   teeth brushed   suction provided  Taken 04/30/2024 1200 by Gladis Andrez HERO, RN  Head of Bed [facility]) Positioning: HOB at 30-45 degrees  Oral Care:   suction provided  teeth brushed   mouth swabbed  Taken 04/30/2024 1000 by Gladis Andrez HERO, RN  Head of Bed [facility]) Positioning: HOB at 30-45 degrees  Oral Care:   suction provided   teeth brushed   mouth swabbed  Taken 04/30/2024 0800 by Gladis Andrez HERO, RN  Head of Bed [facility]) Positioning: HOB at 30-45 degrees  Oral Care:   suction provided   mouth swabbed   tongue brushed     Problem: Mechanical Ventilation Invasive  Goal: Optimal Device Function  Intervention: Optimize Device Care and Function  Recent Flowsheet Documentation  Taken 04/30/2024 1800 by Gladis Andrez HERO, RN  Oral Care:   suction provided   mouth swabbed  Taken 04/30/2024 1600 by Gladis Andrez HERO, RN  Oral Care:   suction provided   mouth swabbed  Taken 04/30/2024 1400 by Gladis Andrez HERO, RN  Oral Care:   mouth swabbed   teeth brushed   suction provided  Taken 04/30/2024 1200 by Gladis Andrez HERO, RN  Oral Care:   suction provided   teeth brushed   mouth swabbed  Taken 04/30/2024 1000 by Gladis Andrez HERO, RN  Oral Care:   suction provided   teeth brushed   mouth swabbed  Taken 04/30/2024 0800 by Gladis Andrez HERO, RN  Oral Care:   suction provided   mouth swabbed   tongue brushed  Goal: Absence of Device-Related Skin and Tissue Injury  Intervention: Maintain Skin and Tissue Health  Recent Flowsheet Documentation  Taken 04/30/2024 0800 by Gladis Andrez HERO, RN  Device Skin Pressure Protection:   absorbent pad utilized/changed   adhesive use limited   positioning supports utilized   pressure points protected   tubing/devices free from skin contact  Taken 04/30/2024 0745 by Gladis Andrez HERO, RN  Device Skin Pressure Protection:   absorbent pad utilized/changed   adhesive use limited   positioning supports utilized   pressure points protected   tubing/devices free from skin contact  Goal: Absence of Ventilator-Induced Lung Injury  Intervention: Prevent Ventilator-Associated Pneumonia  Recent Flowsheet Documentation  Taken 04/30/2024 1800 by Gladis Andrez HERO, RN  Head of Bed [facility]) Positioning: HOB at 30-45 degrees  Oral Care:   suction provided   mouth swabbed  Taken 04/30/2024 1600 by Gladis Andrez HERO, RN  Head of Bed [facility]) Positioning: HOB at 45 degrees  Oral Care:   suction provided   mouth swabbed  Taken 04/30/2024 1400 by Gladis Andrez HERO, RN  Head of Bed [facility]) Positioning: HOB at 30-45 degrees  Oral Care:   mouth swabbed   teeth brushed   suction provided  Taken 04/30/2024 1200 by Gladis Andrez HERO, RN  Head of Bed [facility]) Positioning: HOB at 30-45 degrees  Oral Care:   suction provided   teeth brushed   mouth swabbed  Taken 04/30/2024 1000 by Gladis Andrez HERO, RN  Head of Bed [facility]) Positioning: HOB at 30-45 degrees  Oral Care:   suction provided   teeth brushed   mouth swabbed  Taken 04/30/2024 0800 by Gladis Andrez HERO, RN  Head of Bed [facility]) Positioning: HOB at 30-45 degrees  Oral Care:   suction provided   mouth swabbed   tongue brushed     Problem: Mechanical Ventilation Invasive  Goal: Optimal Device Function  Intervention: Optimize Device Care and Function  Recent Flowsheet Documentation  Taken 04/30/2024 1800 by Gladis Andrez HERO, RN  Oral Care:   suction provided   mouth swabbed  Taken 04/30/2024 1600 by Gladis Andrez HERO,  RN  Oral Care:   suction provided   mouth swabbed  Taken 04/30/2024 1400 by Gladis Andrez HERO, RN  Oral Care:   mouth swabbed   teeth brushed   suction provided  Taken 04/30/2024 1200 by Gladis Andrez HERO, RN  Oral Care:   suction provided   teeth brushed   mouth swabbed  Taken 04/30/2024 1000 by Gladis Andrez HERO, RN  Oral Care:   suction provided   teeth brushed   mouth swabbed  Taken 04/30/2024 0800 by Gladis Andrez HERO, RN  Oral Care:   suction provided   mouth swabbed   tongue brushed  Goal: Absence of Device-Related Skin and Tissue Injury  Intervention: Maintain Skin and Tissue Health  Recent Flowsheet Documentation  Taken 04/30/2024 0800 by Gladis Andrez HERO, RN  Device Skin Pressure Protection:   absorbent pad utilized/changed   adhesive use limited   positioning supports utilized   pressure points protected   tubing/devices free from skin contact  Taken 04/30/2024 0745 by Gladis Andrez HERO, RN  Device Skin Pressure Protection:   absorbent pad utilized/changed   adhesive use limited   positioning supports utilized   pressure points protected   tubing/devices free from skin contact  Goal: Absence of Ventilator-Induced Lung Injury  Intervention: Prevent Ventilator-Associated Pneumonia  Recent Flowsheet Documentation  Taken 04/30/2024 1800 by Gladis Andrez HERO, RN  Head of Bed [facility]) Positioning: HOB at 30-45 degrees  Oral Care:   suction provided   mouth swabbed  Taken 04/30/2024 1600 by Gladis Andrez HERO, RN  Head of Bed [facility]) Positioning: HOB at 45 degrees  Oral Care:   suction provided   mouth swabbed  Taken 04/30/2024 1400 by Gladis Andrez HERO, RN  Head of Bed [facility]) Positioning: HOB at 30-45 degrees  Oral Care:   mouth swabbed   teeth brushed   suction provided  Taken 04/30/2024 1200 by Gladis Andrez HERO, RN  Head of Bed [facility]) Positioning: HOB at 30-45 degrees  Oral Care:   suction provided   teeth brushed   mouth swabbed  Taken 04/30/2024 1000 by Gladis Andrez HERO, RN  Head of Bed [facility]) Positioning: HOB at 30-45 degrees  Oral Care:   suction provided   teeth brushed   mouth swabbed  Taken 04/30/2024 0800 by Gladis Andrez HERO, RN  Head of Bed [facility]) Positioning: HOB at 30-45 degrees  Oral Care:   suction provided   mouth swabbed   tongue brushed     Problem: Mechanical Ventilation Invasive  Goal: Optimal Device Function  Intervention: Optimize Device Care and Function  Recent Flowsheet Documentation  Taken 04/30/2024 1800 by Gladis Andrez HERO, RN  Oral Care:   suction provided   mouth swabbed  Taken 04/30/2024 1600 by Gladis Andrez HERO, RN  Oral Care:   suction provided   mouth swabbed  Taken 04/30/2024 1400 by Gladis Andrez HERO, RN  Oral Care:   mouth swabbed   teeth brushed   suction provided  Taken 04/30/2024 1200 by Gladis Andrez HERO, RN  Oral Care:   suction provided   teeth brushed   mouth swabbed  Taken 04/30/2024 1000 by Gladis Andrez HERO, RN  Oral Care:   suction provided   teeth brushed   mouth swabbed  Taken 04/30/2024 0800 by Gladis Andrez HERO, RN  Oral Care:   suction provided   mouth swabbed   tongue brushed  Goal: Absence of Device-Related Skin and Tissue Injury  Intervention: Maintain Skin and Tissue Health  Recent  Flowsheet Documentation  Taken 04/30/2024 0800 by Gladis Andrez HERO, RN  Device Skin Pressure Protection:   absorbent pad utilized/changed   adhesive use limited   positioning supports utilized   pressure points protected   tubing/devices free from skin contact  Taken 04/30/2024 0745 by Gladis Andrez HERO, RN  Device Skin Pressure Protection:   absorbent pad utilized/changed   adhesive use limited   positioning supports utilized   pressure points protected   tubing/devices free from skin contact  Goal: Absence of Ventilator-Induced Lung Injury  Intervention: Prevent Ventilator-Associated Pneumonia  Recent Flowsheet Documentation  Taken 04/30/2024 1800 by Gladis Andrez HERO, RN  Head of Bed [facility]) Positioning: HOB at 30-45 degrees  Oral Care:   suction provided   mouth swabbed  Taken 04/30/2024 1600 by Gladis Andrez HERO, RN  Head of Bed [facility]) Positioning: HOB at 45 degrees  Oral Care:   suction provided   mouth swabbed  Taken 04/30/2024 1400 by Gladis Andrez HERO, RN  Head of Bed [facility]) Positioning: HOB at 30-45 degrees  Oral Care:   mouth swabbed   teeth brushed   suction provided  Taken 04/30/2024 1200 by Gladis Andrez HERO, RN  Head of Bed [facility]) Positioning: HOB at 30-45 degrees  Oral Care:   suction provided   teeth brushed   mouth swabbed  Taken 04/30/2024 1000 by Gladis Andrez HERO, RN  Head of Bed [facility]) Positioning: HOB at 30-45 degrees  Oral Care:   suction provided   teeth brushed   mouth swabbed  Taken 04/30/2024 0800 by Gladis Andrez HERO, RN  Head of Bed [facility]) Positioning: HOB at 30-45 degrees  Oral Care:   suction provided   mouth swabbed   tongue brushed     Problem: Artificial Airway  Goal: Optimal Device Function  Intervention: Optimize Device Care and Function  Recent Flowsheet Documentation  Taken 04/30/2024 1800 by Gladis Andrez HERO, RN  Oral Care:   suction provided   mouth swabbed  Taken 04/30/2024 1600 by Gladis Andrez HERO, RN  Oral Care:   suction provided   mouth swabbed  Taken 04/30/2024 1400 by Gladis Andrez HERO, RN  Oral Care:   mouth swabbed   teeth brushed   suction provided  Taken 04/30/2024 1200 by Gladis Andrez HERO, RN  Oral Care:   suction provided   teeth brushed   mouth swabbed  Taken 04/30/2024 1000 by Gladis Andrez HERO, RN  Oral Care:   suction provided   teeth brushed   mouth swabbed  Taken 04/30/2024 0800 by Gladis Andrez HERO, RN  Aspiration Precautions:   awake/alert before oral intake   distractions minimized during oral intake  Oral Care:   suction provided   mouth swabbed   tongue brushed  Goal: Absence of Device-Related Skin or Tissue Injury  Intervention: Maintain Skin and Tissue Health  Recent Flowsheet Documentation  Taken 04/30/2024 0800 by Gladis Andrez HERO, RN  Device Skin Pressure Protection:   absorbent pad utilized/changed   adhesive use limited   positioning supports utilized   pressure points protected   tubing/devices free from skin contact  Taken 04/30/2024 0745 by Gladis Andrez HERO, RN  Device Skin Pressure Protection:   absorbent pad utilized/changed   adhesive use limited   positioning supports utilized   pressure points protected   tubing/devices free from skin contact     Problem: Mechanical Ventilation Invasive  Goal: Optimal Device Function  Intervention: Optimize Device Care and Function  Recent Flowsheet Documentation  Taken 04/30/2024  1800 by Gladis Andrez HERO, RN  Oral Care:   suction provided   mouth swabbed  Taken 04/30/2024 1600 by Gladis Andrez HERO, RN  Oral Care:   suction provided   mouth swabbed  Taken 04/30/2024 1400 by Gladis Andrez HERO, RN  Oral Care:   mouth swabbed   teeth brushed   suction provided  Taken 04/30/2024 1200 by Gladis Andrez HERO, RN  Oral Care:   suction provided   teeth brushed   mouth swabbed  Taken 04/30/2024 1000 by Gladis Andrez HERO, RN  Oral Care:   suction provided   teeth brushed   mouth swabbed  Taken 04/30/2024 0800 by Gladis Andrez HERO, RN  Oral Care:   suction provided   mouth swabbed   tongue brushed  Goal: Absence of Device-Related Skin and Tissue Injury  Intervention: Maintain Skin and Tissue Health  Recent Flowsheet Documentation  Taken 04/30/2024 0800 by Gladis Andrez HERO, RN  Device Skin Pressure Protection:   absorbent pad utilized/changed   adhesive use limited   positioning supports utilized   pressure points protected   tubing/devices free from skin contact  Taken 04/30/2024 0745 by Gladis Andrez HERO, RN  Device Skin Pressure Protection:   absorbent pad utilized/changed   adhesive use limited   positioning supports utilized   pressure points protected   tubing/devices free from skin contact  Goal: Absence of Ventilator-Induced Lung Injury  Intervention: Prevent Ventilator-Associated Pneumonia  Recent Flowsheet Documentation  Taken 04/30/2024 1800 by Gladis Andrez HERO, RN  Head of Bed [facility]) Positioning: HOB at 30-45 degrees  Oral Care:   suction provided   mouth swabbed  Taken 04/30/2024 1600 by Gladis Andrez HERO, RN  Head of Bed [facility]) Positioning: HOB at 45 degrees  Oral Care:   suction provided   mouth swabbed  Taken 04/30/2024 1400 by Gladis Andrez HERO, RN  Head of Bed [facility]) Positioning: HOB at 30-45 degrees  Oral Care:   mouth swabbed   teeth brushed   suction provided  Taken 04/30/2024 1200 by Gladis Andrez HERO, RN  Head of Bed [facility]) Positioning: HOB at 30-45 degrees  Oral Care:   suction provided   teeth brushed   mouth swabbed  Taken 04/30/2024 1000 by Gladis Andrez HERO, RN  Head of Bed [facility]) Positioning: HOB at 30-45 degrees  Oral Care:   suction provided   teeth brushed   mouth swabbed  Taken 04/30/2024 0800 by Gladis Andrez HERO, RN  Head of Bed [facility]) Positioning: HOB at 30-45 degrees  Oral Care:   suction provided   mouth swabbed   tongue brushed

## 2024-04-30 NOTE — Progress Notes (Signed)
 Hospital Medicine Daily Progress Note    Assessment/Plan:    Principal Problem:    Bacteremia, escherichia coli  Active Problems:    Alcoholic liver disease (HHS-HCC)    HTN (hypertension)    Depression with anxiety    Class 2 severe obesity with serious comorbidity in adult    Atrial fibrillation    (CMS-HCC)    Pleural effusion    Hypernatremia    Thrombocytopenia    Cirrhosis    (CMS-HCC)    A-fib (CMS-HCC)    Hepatic encephalopathy    (CMS-HCC)    Anaphylaxis   Malnutrition Evaluation as performed by RD, LDN: Patient does not meet AND/ASPEN criteria for malnutrition at this time (03/16/24 1326)    Please see wound care flowsheets for wound documentation     LOS: 51 days (under inpatient status)    [name] is a 78 y.o. 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 Complicated hospital course followed, with recurrent episodes of hypoxemic hypercarbic respiratory failure mostly due to persistent encephalopathy leading to hypoventilation, ultimately requiring tracheostomy. Now transferred out of MICU on 04/30/2024 for wean of ventilator support.    # Acute hypoxemic hypercarbic respiratory failure  # Tracheostomy dependence  Failed extubation x 3 this admission; now has tracheostomy. Unable to tolerate sustained periods off volume-controlled mode so far. Poor mechanics, secretions, volume overload probably all a factor  -- first trach change by ENT done 04/17/2024  -- routine trach care per RT (#6 Shiley cuffed)  -- vent wean with help from RT, likely pulmonology consult service  -- Continue diuresis, Lasix  40 mg IV bid and monitor response    # Ventilator-associated pneumonia  Dx 04/28/2024. Tracheal aspirate growing Staph aureus, Klebsiella  -- cefepime , vancomycin  x 7 days (end = 05/04/2024)    # Persistent atrial fibrillation  # LAA thrombus  Rates adequately controlled on current regimen  -- metoprolol  25 mg bid  -- apixaban  5 mg bid    # Chronic diastolic heart failure (HFimpEF)  Not an active issue; edema unlikely cardiogenic in nature  -- strict intake / output    # Right internal jugular / carotid fistula, resolved  Apparently thrombosed; no flow on carotid doppler 03/26/2024  -- discuss with vascular surgery whether any follow-up needed before discharge    # Decompensated MetALD cirrhosis with hepatic encephalopathy  -- hepatology signed off; re-engage prn  -- titrate lactulose  to ~3862033571 mL stool daily (per MICU plan)  -- continue rifaximin   -- continue multivitamin, thiamine     # Upper GI bleed due to Mallory-Weiss tear  -- continue PPI daily  -- famotidine  bid for GI ppx in critical illness (?)    # Fecal incontinence  -- rectal tube to remain for now given brisk stool output    # Nutrition  -- NG tube to remain for now. Will need to consider more durable feeding tube pending goals of care  -- tube feeds as below; provides 100% caloric needs    # Hypernatremia, improved  Recurring issue earlier in hospital course, now resolved with modifications to FWF volume.  -- Continue FWF 100 mL q2         # Anasarca  Due to prolonged critical illness, liver  disease, etc  -- diuresis as above    # Encephalopathy, multifactorial  At this point hard to tell what (if anything) is most important driver. Many metabolic derangements, long hospital stay, prolonged sedation for mechanical ventilation, cardiac arrest, etc etc. Has not improved much despite brisk stool output and ammonia improved so little more to optimize re hepatic encephalopathy. Doubt ongoing seizure but may still be recovering from period of subclinical seizure.   -- ensure adequate bowel movements to ward off hepatic encephalopathy  -- optimize electrolytes  -- control seizures as below  -- minimize centrally-acting medications as much as feasible  -- routine delirium ppx  -- repeat MRI brain w/wo ~05/05/2024 to monitor cortical restricted diffusion seen on prior study    # Focal non-convulsive seizures  -- levetiracetam  750 mg bid  -- lacosamide  100 mg bid  -- re-consult neurology if breakthrough activity    # Disposition / GoC  See many prior ACP notes from prior teams including palliative care. Currently full code with plan for full restorative care IF there is possibility for functional recovery.   -- family meeting ~05/04/2024; please coordinate with daughter [name]  -- family declines transfer back to [facility] given rather traumatic experience there      Daily checklist:  VTE prophylaxis: therapeutic anticoagulation  Diet: Separate Enteral Additives ProSource NoCarb (Protein Modular/Sugarfree); Quantity: 2 pkts BID  Nutren 2.0 continuous tube feed  Code status: Full Code  HCDM:   HCDM (patient stated preference): [name] - Spouse - [phone]    HCDM (patient stated preference): [name] - Daughter - [phone]    Disposition: stepdown status. Not medically ready for discharge. Anticipate discharge to [facility] for prolonged trach/vent wean unless goals of care change    PT/OT recommendations: Low intensity, 5x weekly (04/25/24) / To be determined (04/25/24)  DME needs: Defer to post acute (04/25/24) / Defer to post acute (04/25/24)    I personally spent 40 minutes face-to-face and non-face-to-face in the care of this patient, which includes all pre, intra, and post visit time on the date of service.  All documented time was specific to the E/M visit and does not include any procedures that may have been performed.    ___________________________________________________________________    Hospital course to date  See Dr. Creasie note 11/28 for excellent summary of complicated hospital course.   This AM was seen at bedside; no family present. Appears comfortable. No interval events or concerns.     Labs/Studies:  Labs and Studies from the last 24hrs per EMR and Reviewed    Objective:  Temp:  [36.1 ??C (97 ??F)-37 ??C (98.6 ??F)] 36.6 ??C (97.9 ??F)  Pulse:  [90-113] 90  SpO2 Pulse:  [77-118] 90  Resp:  [15-27] 25  BP: (111-171)/(52-97) 111/52  FiO2 (%):  [25 %] 25 %  SpO2:  [97 %-98 %] 97 %    GEN: ill appearing woman lying in bed  HEENT: small-bore feeding tube present; lips dry  CV: irreg irreg, normal S1/S2, soft 1/6 systolic murmur  PULM: coarse rhonci bilaterally; trach in place wo secretions noted  ABD: obese, soft, nondistended, does not wince with palpation, +bowel sounds  EXT: gross anasarca diffusely  NEURO: rouses to voice, does not follow commands    LUE PICC present

## 2024-05-01 LAB — CBC W/ AUTO DIFF
BASOPHILS ABSOLUTE COUNT: 0.1 10*9/L (ref 0.0–0.1)
BASOPHILS RELATIVE PERCENT: 0.8 %
EOSINOPHILS ABSOLUTE COUNT: 0.2 10*9/L (ref 0.0–0.5)
EOSINOPHILS RELATIVE PERCENT: 2.6 %
HEMATOCRIT: 25.5 % — ABNORMAL LOW (ref 34.0–44.0)
HEMOGLOBIN: 8.2 g/dL — ABNORMAL LOW (ref 11.3–14.9)
LYMPHOCYTES ABSOLUTE COUNT: 1 10*9/L — ABNORMAL LOW (ref 1.1–3.6)
LYMPHOCYTES RELATIVE PERCENT: 15.7 %
MEAN CORPUSCULAR HEMOGLOBIN CONC: 32.1 g/dL (ref 32.0–36.0)
MEAN CORPUSCULAR HEMOGLOBIN: 30.7 pg (ref 25.9–32.4)
MEAN CORPUSCULAR VOLUME: 95.5 fL (ref 77.6–95.7)
MEAN PLATELET VOLUME: 8.9 fL (ref 6.8–10.7)
MONOCYTES ABSOLUTE COUNT: 0.9 10*9/L — ABNORMAL HIGH (ref 0.3–0.8)
MONOCYTES RELATIVE PERCENT: 14.2 %
NEUTROPHILS ABSOLUTE COUNT: 4.3 10*9/L (ref 1.8–7.8)
NEUTROPHILS RELATIVE PERCENT: 66.7 %
PLATELET COUNT: 163 10*9/L (ref 150–450)
RED BLOOD CELL COUNT: 2.67 10*12/L — ABNORMAL LOW (ref 3.95–5.13)
RED CELL DISTRIBUTION WIDTH: 18.7 % — ABNORMAL HIGH (ref 12.2–15.2)
WBC ADJUSTED: 6.4 10*9/L (ref 3.6–11.2)

## 2024-05-01 LAB — HEPATIC FUNCTION PANEL
ALBUMIN: 2 g/dL — ABNORMAL LOW (ref 3.4–5.0)
ALKALINE PHOSPHATASE: 191 U/L — ABNORMAL HIGH (ref 46–116)
ALT (SGPT): 13 U/L (ref 10–49)
AST (SGOT): 29 U/L (ref <=34)
BILIRUBIN DIRECT: 0.4 mg/dL — ABNORMAL HIGH (ref 0.00–0.30)
BILIRUBIN TOTAL: 0.7 mg/dL (ref 0.3–1.2)
PROTEIN TOTAL: 5.4 g/dL — ABNORMAL LOW (ref 5.7–8.2)

## 2024-05-01 LAB — BASIC METABOLIC PANEL
ANION GAP: 8 mmol/L (ref 5–14)
BLOOD UREA NITROGEN: 19 mg/dL (ref 9–23)
BUN / CREAT RATIO: 48
CALCIUM: 8.1 mg/dL — ABNORMAL LOW (ref 8.7–10.4)
CHLORIDE: 105 mmol/L (ref 98–107)
CO2: 25 mmol/L (ref 20.0–31.0)
CREATININE: 0.4 mg/dL — ABNORMAL LOW (ref 0.55–1.02)
EGFR CKD-EPI (2021) FEMALE: >90 mL/min/1.73m2 (ref >=60)
GLUCOSE RANDOM: 133 mg/dL (ref 70–179)
POTASSIUM: 3.6 mmol/L (ref 3.4–4.8)
SODIUM: 138 mmol/L (ref 135–145)

## 2024-05-01 LAB — MAGNESIUM: MAGNESIUM: 1.9 mg/dL (ref 1.6–2.6)

## 2024-05-01 LAB — PHOSPHORUS: PHOSPHORUS: 2.6 mg/dL (ref 2.4–5.1)

## 2024-05-01 MED ADMIN — sodium chloride (NS) 0.9 % flush 10 mL: 10 mL | INTRAVENOUS | @ 08:00:00

## 2024-05-01 MED ADMIN — sodium chloride (NS) 0.9 % flush 10 mL: 10 mL | INTRAVENOUS | @ 17:00:00

## 2024-05-01 MED ADMIN — rifAXIMin (XIFAXAN) oral suspension: 550 mg | GASTROENTERAL | @ 14:00:00 | Stop: 2025-04-11

## 2024-05-01 MED ADMIN — rifAXIMin (XIFAXAN) oral suspension: 550 mg | GASTROENTERAL | @ 01:00:00 | Stop: 2025-04-11

## 2024-05-01 MED ADMIN — lactulose oral solution: 30 g | GASTROENTERAL | @ 01:00:00

## 2024-05-01 MED ADMIN — lactulose oral solution: 30 g | GASTROENTERAL | @ 14:00:00

## 2024-05-01 MED ADMIN — lactulose oral solution: 30 g | GASTROENTERAL | @ 19:00:00

## 2024-05-01 MED ADMIN — folic acid (FOLVITE) tablet 1 mg: 1 mg | GASTROENTERAL | @ 14:00:00

## 2024-05-01 MED ADMIN — metoPROLOL tartrate (Lopressor) tablet 25 mg: 25 mg | GASTROENTERAL | @ 01:00:00

## 2024-05-01 MED ADMIN — metoPROLOL tartrate (Lopressor) tablet 25 mg: 25 mg | GASTROENTERAL | @ 14:00:00

## 2024-05-01 MED ADMIN — thiamine mononitrate (vit B1) tablet 100 mg: 100 mg | GASTROENTERAL | @ 14:00:00

## 2024-05-01 MED ADMIN — ergocalciferol (DRISDOL) oral drops 200 mcg/mL (8,000 unit/mL): 100 ug | GASTROENTERAL | @ 14:00:00 | Stop: 2024-05-21

## 2024-05-01 MED ADMIN — cyanocobalamin (vitamin B-12) tablet 1,000 mcg: 1000 ug | GASTROENTERAL | @ 14:00:00

## 2024-05-01 MED ADMIN — levETIRAcetam (KEPPRA) tablet 750 mg: 750 mg | GASTROENTERAL | @ 14:00:00

## 2024-05-01 MED ADMIN — levETIRAcetam (KEPPRA) tablet 750 mg: 750 mg | GASTROENTERAL | @ 01:00:00

## 2024-05-01 MED ADMIN — famotidine (PEPCID) tablet 20 mg: 20 mg | GASTROENTERAL | @ 14:00:00

## 2024-05-01 MED ADMIN — melatonin tablet 3 mg: 3 mg | GASTROENTERAL | @ 23:00:00

## 2024-05-01 MED ADMIN — apixaban (ELIQUIS) tablet 5 mg: 5 mg | GASTROENTERAL | @ 14:00:00

## 2024-05-01 MED ADMIN — apixaban (ELIQUIS) tablet 5 mg: 5 mg | GASTROENTERAL | @ 01:00:00

## 2024-05-01 MED ADMIN — cefepime (MAXIPIME) 2 g in sodium chloride 0.9 % (NS) 100 mL IVPB-MBP: 2 g | INTRAVENOUS | @ 23:00:00 | Stop: 2024-05-04

## 2024-05-01 MED ADMIN — cefepime (MAXIPIME) 2 g in sodium chloride 0.9 % (NS) 100 mL IVPB-MBP: 2 g | INTRAVENOUS | @ 10:00:00 | Stop: 2024-05-04

## 2024-05-01 MED ADMIN — zinc acetate oral solution: 50 mg | GASTROENTERAL | @ 23:00:00 | Stop: 2024-05-06

## 2024-05-01 MED ADMIN — esomeprazole (NEXIUM) granules 40 mg: 40 mg | GASTROENTERAL | @ 14:00:00

## 2024-05-01 MED ADMIN — multivitamins, therapeutic with minerals tablet 1 tablet: 1 | GASTROENTERAL | @ 14:00:00

## 2024-05-01 MED ADMIN — lacosamide (VIMPAT) tablet 100 mg: 100 mg | GASTROENTERAL | @ 14:00:00

## 2024-05-01 MED ADMIN — lacosamide (VIMPAT) tablet 100 mg: 100 mg | GASTROENTERAL | @ 01:00:00

## 2024-05-01 MED ADMIN — furosemide (LASIX) injection 40 mg: 40 mg | INTRAVENOUS | @ 11:00:00

## 2024-05-01 MED ADMIN — furosemide (LASIX) injection 40 mg: 40 mg | INTRAVENOUS | @ 19:00:00

## 2024-05-01 NOTE — Plan of Care (Signed)
 Patient has been on PSV over 10 hrs. Patient will be placed back on a rate, PRVC VT 330, RR 12, +5, FiO2 25% tonight. Patient trach care was completed. Patient has a moderate amount of thick tan secretions.     Problem: Mechanical Ventilation Invasive  Goal: Optimal Device Function  Intervention: Optimize Device Care and Function  Recent Flowsheet Documentation  Taken 05/01/2024 1757 by Cesario Raynard PARAS, RRT  Airway Safety Measures:   manual resuscitator/mask at bedside   suction at bedside   oxygen flowmeter at bedside  Taken 05/01/2024 1751 by Cesario Raynard PARAS, RRT  Airway/Ventilation Management:   airway patency maintained   humidification applied  Oral Care:   mouth swabbed   suction provided   tongue brushed  Airway Safety Measures:   manual resuscitator/mask at bedside   suction at bedside  Taken 05/01/2024 0827 by Cesario Raynard PARAS, RRT  Oral Care:   mouth swabbed   suction provided   tongue brushed  Goal: Absence of Device-Related Skin and Tissue Injury  Intervention: Maintain Skin and Tissue Health  Recent Flowsheet Documentation  Taken 05/01/2024 1757 by Cesario Raynard PARAS, RRT  Device Skin Pressure Protection:   absorbent pad utilized/changed   skin-to-device areas padded  Goal: Absence of Ventilator-Induced Lung Injury  Intervention: Facilitate Lung-Protection Measures  Recent Flowsheet Documentation  Taken 05/01/2024 1751 by Cesario Raynard PARAS, RRT  Lung Protection Measures:   ventilator waveforms monitored   lung compliance monitored  Intervention: Prevent Ventilator-Associated Pneumonia  Recent Flowsheet Documentation  Taken 05/01/2024 1751 by Cesario Raynard PARAS, RRT  Head of Bed [facility]) Positioning: HOB at 30-45 degrees  Oral Care:   mouth swabbed   suction provided   tongue brushed  Taken 05/01/2024 0827 by Cesario Raynard PARAS, RRT  Head of Bed (HOB) Positioning: HOB at 30-45 degrees  Oral Care:   mouth swabbed   suction provided   tongue brushed     Problem: Artificial Airway  Goal: Effective Communication  Outcome: Ongoing - Unchanged  Goal: Optimal Device Function  Outcome: Ongoing - Unchanged  Intervention: Optimize Device Care and Function  Flowsheets  Taken 05/01/2024 1757  Aspiration Precautions: respiratory status monitored  Airway Safety Measures:   manual resuscitator/mask at bedside   suction at bedside   oxygen flowmeter at bedside  Taken 05/01/2024 1751  Airway/Ventilation Management:   airway patency maintained   humidification applied  Oral Care:   mouth swabbed   suction provided   tongue brushed  Airway Safety Measures:   manual resuscitator/mask at bedside   suction at bedside  Taken 05/01/2024 0827  Oral Care:   mouth swabbed   suction provided   tongue brushed  Goal: Absence of Device-Related Skin or Tissue Injury  Outcome: Ongoing - Unchanged  Intervention: Maintain Skin and Tissue Health  Flowsheets (Taken 05/01/2024 1757)  Device Skin Pressure Protection:   absorbent pad utilized/changed   skin-to-device areas padded

## 2024-05-01 NOTE — Plan of Care (Signed)
 Problem: Skin Injury Risk Increased  Goal: Skin Health and Integrity  Intervention: Optimize Skin Protection  Recent Flowsheet Documentation  Taken 05/01/2024 0136 by Pearson Bolk, RRT  Head of Bed [facility]) Positioning: HOB at 30-45 degrees  Taken 04/30/2024 1934 by Pearson Bolk, RRT  Head of Bed [name]) Positioning: HOB at 30-45 degrees     Problem: Breathing Pattern Ineffective  Goal: Effective Breathing Pattern  Intervention: Promote Improved Breathing Pattern  Recent Flowsheet Documentation  Taken 05/01/2024 0136 by Pearson Bolk, RRT  Head of Bed [facility]) Positioning: HOB at 30-45 degrees  Airway/Ventilation Management:   airway patency maintained   humidification applied   pulmonary hygiene promoted  Taken 04/30/2024 1934 by Pearson Bolk, RRT  Head of Bed [facility]) Positioning: HOB at 30-45 degrees  Airway/Ventilation Management:   airway patency maintained   humidification applied   pulmonary hygiene promoted     Problem: Gas Exchange Impaired  Goal: Optimal Gas Exchange  Intervention: Optimize Oxygenation and Ventilation  Recent Flowsheet Documentation  Taken 05/01/2024 0136 by Pearson Bolk, RRT  Head of Bed [facility]) Positioning: HOB at 30-45 degrees  Airway/Ventilation Management:   airway patency maintained   humidification applied   pulmonary hygiene promoted  Taken 04/30/2024 1934 by Pearson Bolk, RRT  Head of Bed [facility]) Positioning: HOB at 30-45 degrees  Airway/Ventilation Management:   airway patency maintained   humidification applied   pulmonary hygiene promoted     Problem: Wound  Goal: Skin Health and Integrity  Intervention: Optimize Skin Protection  Recent Flowsheet Documentation  Taken 05/01/2024 0136 by Pearson Bolk, RRT  Head of Bed [facility]) Positioning: HOB at 30-45 degrees  Taken 04/30/2024 1934 by Pearson Bolk, RRT  Head of Bed [facility]) Positioning: HOB at 30-45 degrees     Problem: Mechanical Ventilation Invasive  Goal: Optimal Device Function  Intervention: Optimize Device Care and Function  Recent Flowsheet Documentation  Taken 05/01/2024 0136 by Pearson Bolk, RRT  Airway/Ventilation Management:   airway patency maintained   humidification applied   pulmonary hygiene promoted  Taken 04/30/2024 1934 by Pearson Bolk, RRT  Airway/Ventilation Management:   airway patency maintained   humidification applied   pulmonary hygiene promoted  Oral Care:   mouth swabbed   oral rinse provided   suction provided   tongue brushed  Goal: Absence of Ventilator-Induced Lung Injury  Intervention: Prevent Ventilator-Associated Pneumonia  Recent Flowsheet Documentation  Taken 05/01/2024 0136 by Pearson Bolk, RRT  Head of Bed [facility]) Positioning: HOB at 30-45 degrees  VAP Prevention Bundle:   HOB elevation maintained   oral care regularly provided   vent circuit breaks minimized  Taken 04/30/2024 1934 by Pearson Bolk, RRT  Head of Bed [facility]) Positioning: HOB at 30-45 degrees  VAP Prevention Bundle:   HOB elevation maintained   oral care regularly provided   vent circuit breaks minimized  Oral Care:   mouth swabbed   oral rinse provided   suction provided   tongue brushed     Problem: Mechanical Ventilation Invasive  Goal: Optimal Device Function  Intervention: Optimize Device Care and Function  Recent Flowsheet Documentation  Taken 05/01/2024 0136 by Pearson Bolk, RRT  Airway/Ventilation Management:   airway patency maintained   humidification applied   pulmonary hygiene promoted  Taken 04/30/2024 1934 by Pearson Bolk, RRT  Airway/Ventilation Management:   airway patency maintained   humidification applied   pulmonary hygiene promoted  Oral Care:   mouth swabbed   oral rinse provided   suction provided   tongue  brushed  Goal: Absence of Ventilator-Induced Lung Injury  Intervention: Prevent Ventilator-Associated Pneumonia  Recent Flowsheet Documentation  Taken 05/01/2024 0136 by Pearson Bolk, RRT  Head of Bed [facility]) Positioning: HOB at 30-45 degrees  VAP Prevention Bundle:   HOB elevation maintained   oral care regularly provided   vent circuit breaks minimized  Taken 04/30/2024 1934 by Pearson Bolk, RRT  Head of Bed [facility]) Positioning: HOB at 30-45 degrees  VAP Prevention Bundle:   HOB elevation maintained   oral care regularly provided   vent circuit breaks minimized  Oral Care:   mouth swabbed   oral rinse provided   suction provided   tongue brushed     Problem: Mechanical Ventilation Invasive  Goal: Optimal Device Function  Intervention: Optimize Device Care and Function  Recent Flowsheet Documentation  Taken 05/01/2024 0136 by Pearson Bolk, RRT  Airway/Ventilation Management:   airway patency maintained   humidification applied   pulmonary hygiene promoted  Taken 04/30/2024 1934 by Pearson Bolk, RRT  Airway/Ventilation Management:   airway patency maintained   humidification applied   pulmonary hygiene promoted  Oral Care:   mouth swabbed   oral rinse provided   suction provided   tongue brushed  Goal: Absence of Ventilator-Induced Lung Injury  Intervention: Prevent Ventilator-Associated Pneumonia  Recent Flowsheet Documentation  Taken 05/01/2024 0136 by Pearson Bolk, RRT  Head of Bed [facility]) Positioning: HOB at 30-45 degrees  VAP Prevention Bundle:   HOB elevation maintained   oral care regularly provided   vent circuit breaks minimized  Taken 04/30/2024 1934 by Pearson Bolk, RRT  Head of Bed [facility]) Positioning: HOB at 30-45 degrees  VAP Prevention Bundle:   HOB elevation maintained   oral care regularly provided   vent circuit breaks minimized  Oral Care:   mouth swabbed   oral rinse provided   suction provided   tongue brushed     Problem: Mechanical Ventilation Invasive  Goal: Optimal Device Function  Intervention: Optimize Device Care and Function  Recent Flowsheet Documentation  Taken 05/01/2024 0136 by Pearson Bolk, RRT  Airway/Ventilation Management:   airway patency maintained   humidification applied   pulmonary hygiene promoted  Taken 04/30/2024 1934 by Pearson Bolk, RRT  Airway/Ventilation Management:   airway patency maintained   humidification applied   pulmonary hygiene promoted  Oral Care:   mouth swabbed   oral rinse provided   suction provided   tongue brushed  Goal: Absence of Ventilator-Induced Lung Injury  Intervention: Prevent Ventilator-Associated Pneumonia  Recent Flowsheet Documentation  Taken 05/01/2024 0136 by Pearson Bolk, RRT  Head of Bed [facility]) Positioning: HOB at 30-45 degrees  VAP Prevention Bundle:   HOB elevation maintained   oral care regularly provided   vent circuit breaks minimized  Taken 04/30/2024 1934 by Pearson Bolk, RRT  Head of Bed [facility]) Positioning: HOB at 30-45 degrees  VAP Prevention Bundle:   HOB elevation maintained   oral care regularly provided   vent circuit breaks minimized  Oral Care:   mouth swabbed   oral rinse provided   suction provided   tongue brushed     Problem: Mechanical Ventilation Invasive  Goal: Optimal Device Function  Intervention: Optimize Device Care and Function  Recent Flowsheet Documentation  Taken 05/01/2024 0136 by Pearson Bolk, RRT  Airway/Ventilation Management:   airway patency maintained   humidification applied   pulmonary hygiene promoted  Taken 04/30/2024 1934 by Pearson Bolk, RRT  Airway/Ventilation Management:   airway patency maintained   humidification applied  pulmonary hygiene promoted  Oral Care:   mouth swabbed   oral rinse provided   suction provided   tongue brushed  Goal: Absence of Ventilator-Induced Lung Injury  Intervention: Prevent Ventilator-Associated Pneumonia  Recent Flowsheet Documentation  Taken 05/01/2024 0136 by Pearson Bolk, RRT  Head of Bed [facility]) Positioning: HOB at 30-45 degrees  VAP Prevention Bundle:   HOB elevation maintained   oral care regularly provided   vent circuit breaks minimized  Taken 04/30/2024 1934 by Pearson Bolk, RRT  Head of Bed [facility]) Positioning: HOB at 30-45 degrees  VAP Prevention Bundle:   HOB elevation maintained   oral care regularly provided   vent circuit breaks minimized  Oral Care:   mouth swabbed   oral rinse provided   suction provided   tongue brushed     Problem: Mechanical Ventilation Invasive  Goal: Optimal Device Function  Intervention: Optimize Device Care and Function  Recent Flowsheet Documentation  Taken 05/01/2024 0136 by Pearson Bolk, RRT  Airway/Ventilation Management:   airway patency maintained   humidification applied   pulmonary hygiene promoted  Taken 04/30/2024 1934 by Pearson Bolk, RRT  Airway/Ventilation Management:   airway patency maintained   humidification applied   pulmonary hygiene promoted  Oral Care:   mouth swabbed   oral rinse provided   suction provided   tongue brushed  Goal: Absence of Ventilator-Induced Lung Injury  Intervention: Prevent Ventilator-Associated Pneumonia  Recent Flowsheet Documentation  Taken 05/01/2024 0136 by Pearson Bolk, RRT  Head of Bed [facility]) Positioning: HOB at 30-45 degrees  VAP Prevention Bundle:   HOB elevation maintained   oral care regularly provided   vent circuit breaks minimized  Taken 04/30/2024 1934 by Pearson Bolk, RRT  Head of Bed [facility]) Positioning: HOB at 30-45 degrees  VAP Prevention Bundle:   HOB elevation maintained   oral care regularly provided   vent circuit breaks minimized  Oral Care:   mouth swabbed   oral rinse provided   suction provided   tongue brushed     Problem: Artificial Airway  Goal: Optimal Device Function  Intervention: Optimize Device Care and Function  Recent Flowsheet Documentation  Taken 05/01/2024 0136 by Pearson Bolk, RRT  Airway/Ventilation Management:   airway patency maintained   humidification applied   pulmonary hygiene promoted  Taken 04/30/2024 1934 by Pearson Bolk, RRT  Airway/Ventilation Management:   airway patency maintained   humidification applied   pulmonary hygiene promoted  Oral Care:   mouth swabbed   oral rinse provided   suction provided   tongue brushed     Problem: Mechanical Ventilation Invasive  Goal: Optimal Device Function  Intervention: Optimize Device Care and Function  Recent Flowsheet Documentation  Taken 05/01/2024 0136 by Pearson Bolk, RRT  Airway/Ventilation Management:   airway patency maintained   humidification applied   pulmonary hygiene promoted  Taken 04/30/2024 1934 by Pearson Bolk, RRT  Airway/Ventilation Management:   airway patency maintained   humidification applied   pulmonary hygiene promoted  Oral Care:   mouth swabbed   oral rinse provided   suction provided   tongue brushed  Goal: Absence of Ventilator-Induced Lung Injury  Intervention: Prevent Ventilator-Associated Pneumonia  Recent Flowsheet Documentation  Taken 05/01/2024 0136 by Pearson Bolk, RRT  Head of Bed [facility]) Positioning: HOB at 30-45 degrees  VAP Prevention Bundle:   HOB elevation maintained   oral care regularly provided   vent circuit breaks minimized  Taken 04/30/2024 1934 by Pearson Bolk, RRT  Head of Bed [facility])  Positioning: HOB at 30-45 degrees  VAP Prevention Bundle:   HOB elevation maintained   oral care regularly provided   vent circuit breaks minimized  Oral Care:   mouth swabbed   oral rinse provided   suction provided   tongue brushed  Patient placed back on PRVC settings overnight.  Plan to resume PSV trials as tolerated. Trach patent, secure.  ETS for thick, yellow secretions.

## 2024-05-01 NOTE — Progress Notes (Signed)
 Hospital Medicine Daily Progress Note    Assessment/Plan:    Principal Problem:    Bacteremia, escherichia coli  Active Problems:    Alcoholic liver disease (HHS-HCC)    HTN (hypertension)    Depression with anxiety    Class 2 severe obesity with serious comorbidity in adult    Atrial fibrillation    (CMS-HCC)    Pleural effusion    Hypernatremia    Thrombocytopenia    Cirrhosis    (CMS-HCC)    A-fib (CMS-HCC)    Hepatic encephalopathy    (CMS-HCC)    Anaphylaxis   Malnutrition Evaluation as performed by RD, LDN: Patient does not meet AND/ASPEN criteria for malnutrition at this time (03/16/24 1326)    Please see wound care flowsheets for wound documentation     LOS: 52 days (under inpatient status)    [name] is a 78 y.o. 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 Complicated hospital course followed, with recurrent episodes of hypoxemic hypercarbic respiratory failure mostly due to persistent encephalopathy leading to hypoventilation, ultimately requiring tracheostomy. Now transferred out of MICU on 05/01/2024 for wean of ventilator support.    # Acute hypoxemic hypercarbic respiratory failure  # Tracheostomy dependence  Failed extubation x 3 this admission; now has tracheostomy. Unable to tolerate sustained periods off volume-controlled mode so far. Poor mechanics, secretions, volume overload probably all a factor  -- first trach change by ENT done 04/17/2024  -- routine trach care per RT (#6 Shiley cuffed)  -- vent wean with help from RT, likely pulmonology consult service  -- Continue diuresis, Lasix  40 mg IV bid and monitor response    # Ventilator-associated pneumonia  Dx 04/28/2024. Tracheal aspirate growing Staph aureus, Klebsiella  -- cefepime , vancomycin  x 7 days (end = 05/04/2024)    # Persistent atrial fibrillation  # LAA thrombus  Rates adequately controlled on current regimen  -- metoprolol  25 mg bid  -- apixaban  5 mg bid    # Chronic diastolic heart failure (HFimpEF)  Not an active issue; edema unlikely cardiogenic in nature  -- strict intake / output    # Right internal jugular / carotid fistula, resolved  Apparently thrombosed; no flow on carotid doppler 03/26/2024  -- discuss with vascular surgery whether any follow-up needed before discharge    # Decompensated MetALD cirrhosis with hepatic encephalopathy  -- hepatology signed off; re-engage prn  -- titrate lactulose  to ~[phone] mL stool daily (per MICU plan)  -- continue rifaximin   -- continue multivitamin, thiamine     # Upper GI bleed due to Mallory-Weiss tear  -- continue PPI daily  -- famotidine  bid for GI ppx in critical illness (?)    # Fecal incontinence  -- rectal tube to remain for now given brisk stool output    # Nutrition  -- NG tube to remain for now. Will need to consider more durable feeding tube pending goals of care  -- tube feeds as below; provides 100% caloric needs    # Hypernatremia, improved  Recurring issue earlier in hospital course, now resolved with modifications to FWF volume.  -- Continue FWF 100 mL q2         # Anasarca  Due to prolonged critical illness, liver  disease, etc  -- diuresis as above  -- Elevated extremities and wrap for edema as able    # Encephalopathy, multifactorial  At this point hard to tell what (if anything) is most important driver. Many metabolic derangements, long hospital stay, prolonged sedation for mechanical ventilation, cardiac arrest, etc etc. Has not improved much despite brisk stool output and ammonia improved so little more to optimize re hepatic encephalopathy. Doubt ongoing seizure but may still be recovering from period of subclinical seizure.   -- ensure adequate bowel movements to ward off hepatic encephalopathy  -- optimize electrolytes  -- control seizures as below  -- minimize centrally-acting medications as much as feasible  -- routine delirium ppx  -- repeat MRI brain w/wo ~05/05/2024 to monitor cortical restricted diffusion seen on prior study    # Focal non-convulsive seizures  -- levetiracetam  750 mg bid  -- lacosamide  100 mg bid  -- re-consult neurology if breakthrough activity    # Disposition / GoC  See many prior ACP notes from prior teams including palliative care. Currently full code with plan for full restorative care IF there is possibility for functional recovery.   -- family meeting ~05/04/2024; please coordinate with daughter [name]  -- family declines transfer back to [facility] given rather traumatic experience there      Daily checklist:  VTE prophylaxis: therapeutic anticoagulation  Diet: Separate Enteral Additives ProSource NoCarb (Protein Modular/Sugarfree); Quantity: 2 pkts BID  Nutren 2.0 continuous tube feed  Code status: Full Code  HCDM:   HCDM (patient stated preference): [name],[name]    HCDM (patient stated preference): [name],[name] - Daughter - [phone]    Disposition: stepdown status. Not medically ready for discharge. Anticipate discharge to [facility] for prolonged trach/vent wean unless goals of care change    PT/OT recommendations: Low intensity, 5x weekly (04/25/24) / To be determined (04/25/24)  DME needs: Defer to post acute (04/25/24) / Defer to post acute (04/25/24)    I personally spent 35 minutes face-to-face and non-face-to-face in the care of this patient, which includes all pre, intra, and post visit time on the date of service.  All documented time was specific to the E/M visit and does not include any procedures that may have been performed.    ___________________________________________________________________    Hospital course to date  See Dr. Creasie note 11/28 for excellent summary of complicated hospital course.   This AM was seen at bedside; no family present. Appears comfortable. No interval events or concerns.     Labs/Studies:  Labs and Studies from the last 24hrs per EMR and Reviewed    Objective:  Temp:  [36.8 ??C (98.2 ??F)-37.6 ??C (99.6 ??F)] 37.2 ??C (99 ??F)  Pulse:  [94-122] 94  SpO2 Pulse:  [94-122] 94  Resp:  [18-26] 19  BP: (114-158)/(50-96) 114/50  FiO2 (%):  [25 %] 25 %  SpO2:  [95 %-99 %] 99 %    GEN: ill appearing woman lying in bed  HEENT: small-bore feeding tube present; lips dry  CV: irreg irreg, normal S1/S2, soft 1/6 systolic murmur  PULM: coarse rhonci bilaterally; trach in place wo secretions noted  ABD: obese, soft, nondistended, does not wince with palpation, +bowel sounds  EXT: gross anasarca diffusely  NEURO: rouses to voice, does not follow commands    LUE PICC present

## 2024-05-02 LAB — BASIC METABOLIC PANEL
ANION GAP: 9 mmol/L (ref 5–14)
BLOOD UREA NITROGEN: 18 mg/dL (ref 9–23)
BUN / CREAT RATIO: 45
CALCIUM: 7.9 mg/dL — ABNORMAL LOW (ref 8.7–10.4)
CHLORIDE: 104 mmol/L (ref 98–107)
CO2: 26 mmol/L (ref 20.0–31.0)
CREATININE: 0.4 mg/dL — ABNORMAL LOW (ref 0.55–1.02)
EGFR CKD-EPI (2021) FEMALE: >90 mL/min/1.73m2 (ref >=60)
GLUCOSE RANDOM: 126 mg/dL (ref 70–179)
POTASSIUM: 3.5 mmol/L (ref 3.4–4.8)
SODIUM: 139 mmol/L (ref 135–145)

## 2024-05-02 LAB — CBC W/ AUTO DIFF
BASOPHILS ABSOLUTE COUNT: 0.1 10*9/L (ref 0.0–0.1)
BASOPHILS RELATIVE PERCENT: 1 %
EOSINOPHILS ABSOLUTE COUNT: 0.2 10*9/L (ref 0.0–0.5)
EOSINOPHILS RELATIVE PERCENT: 2.6 %
HEMATOCRIT: 26.1 % — ABNORMAL LOW (ref 34.0–44.0)
HEMOGLOBIN: 8.3 g/dL — ABNORMAL LOW (ref 11.3–14.9)
LYMPHOCYTES ABSOLUTE COUNT: 1.4 10*9/L (ref 1.1–3.6)
LYMPHOCYTES RELATIVE PERCENT: 21.2 %
MEAN CORPUSCULAR HEMOGLOBIN CONC: 31.8 g/dL — ABNORMAL LOW (ref 32.0–36.0)
MEAN CORPUSCULAR HEMOGLOBIN: 30.2 pg (ref 25.9–32.4)
MEAN CORPUSCULAR VOLUME: 94.8 fL (ref 77.6–95.7)
MEAN PLATELET VOLUME: 8.8 fL (ref 6.8–10.7)
MONOCYTES ABSOLUTE COUNT: 1 10*9/L — ABNORMAL HIGH (ref 0.3–0.8)
MONOCYTES RELATIVE PERCENT: 15.1 %
NEUTROPHILS ABSOLUTE COUNT: 4 10*9/L (ref 1.8–7.8)
NEUTROPHILS RELATIVE PERCENT: 60.1 %
PLATELET COUNT: 159 10*9/L (ref 150–450)
RED BLOOD CELL COUNT: 2.75 10*12/L — ABNORMAL LOW (ref 3.95–5.13)
RED CELL DISTRIBUTION WIDTH: 18.2 % — ABNORMAL HIGH (ref 12.2–15.2)
WBC ADJUSTED: 6.6 10*9/L (ref 3.6–11.2)

## 2024-05-02 LAB — BLOOD GAS, VENOUS
BASE EXCESS VENOUS: 1.4 (ref -2.0–2.0)
CARBOXYHEMOGLOBIN, VENOUS: 1.8 % — ABNORMAL HIGH (ref <1.2)
HCO3 VENOUS: 25 mmol/L (ref 22–27)
METHEMOGLOBIN, VENOUS: <1 % (ref <1.5)
O2 SATURATION VENOUS: 64.7 % (ref 40.0–85.0)
OXYHEMOGLOBIN, VENOUS: 63.4 % (ref 40.0–85.0)
PCO2 VENOUS: 35 mmHg — ABNORMAL LOW (ref 40–60)
PH VENOUS: 7.46 — ABNORMAL HIGH (ref 7.32–7.43)
PO2 VENOUS: 37 mmHg (ref 30–55)

## 2024-05-02 LAB — COPPER, SERUM: COPPER: 80 ug/dL

## 2024-05-02 LAB — PHOSPHORUS: PHOSPHORUS: 2.4 mg/dL (ref 2.4–5.1)

## 2024-05-02 LAB — MAGNESIUM: MAGNESIUM: 1.9 mg/dL (ref 1.6–2.6)

## 2024-05-02 MED ADMIN — sodium chloride (NS) 0.9 % flush 10 mL: 10 mL | INTRAVENOUS

## 2024-05-02 MED ADMIN — sodium chloride (NS) 0.9 % flush 10 mL: 10 mL | INTRAVENOUS | @ 08:00:00

## 2024-05-02 MED ADMIN — sodium chloride (NS) 0.9 % flush 10 mL: 10 mL | INTRAVENOUS | @ 17:00:00

## 2024-05-02 MED ADMIN — rifAXIMin (XIFAXAN) oral suspension: 550 mg | GASTROENTERAL | @ 02:00:00 | Stop: 2025-04-11

## 2024-05-02 MED ADMIN — rifAXIMin (XIFAXAN) oral suspension: 550 mg | GASTROENTERAL | @ 13:00:00 | Stop: 2025-04-11

## 2024-05-02 MED ADMIN — lactulose oral solution: 30 g | GASTROENTERAL | @ 13:00:00

## 2024-05-02 MED ADMIN — folic acid (FOLVITE) tablet 1 mg: 1 mg | GASTROENTERAL | @ 13:00:00

## 2024-05-02 MED ADMIN — metoPROLOL tartrate (Lopressor) tablet 25 mg: 25 mg | GASTROENTERAL | @ 13:00:00

## 2024-05-02 MED ADMIN — metoPROLOL tartrate (Lopressor) tablet 25 mg: 25 mg | GASTROENTERAL | @ 02:00:00

## 2024-05-02 MED ADMIN — thiamine mononitrate (vit B1) tablet 100 mg: 100 mg | GASTROENTERAL | @ 13:00:00

## 2024-05-02 MED ADMIN — ergocalciferol (DRISDOL) oral drops 200 mcg/mL (8,000 unit/mL): 100 ug | GASTROENTERAL | @ 13:00:00 | Stop: 2024-05-21

## 2024-05-02 MED ADMIN — cyanocobalamin (vitamin B-12) tablet 1,000 mcg: 1000 ug | GASTROENTERAL | @ 13:00:00

## 2024-05-02 MED ADMIN — levETIRAcetam (KEPPRA) tablet 750 mg: 750 mg | GASTROENTERAL | @ 13:00:00

## 2024-05-02 MED ADMIN — levETIRAcetam (KEPPRA) tablet 750 mg: 750 mg | GASTROENTERAL | @ 02:00:00

## 2024-05-02 MED ADMIN — famotidine (PEPCID) tablet 20 mg: 20 mg | GASTROENTERAL | @ 13:00:00

## 2024-05-02 MED ADMIN — melatonin tablet 3 mg: 3 mg | GASTROENTERAL | @ 23:00:00

## 2024-05-02 MED ADMIN — apixaban (ELIQUIS) tablet 5 mg: 5 mg | GASTROENTERAL | @ 02:00:00

## 2024-05-02 MED ADMIN — apixaban (ELIQUIS) tablet 5 mg: 5 mg | GASTROENTERAL | @ 13:00:00

## 2024-05-02 MED ADMIN — cefepime (MAXIPIME) 2 g in sodium chloride 0.9 % (NS) 100 mL IVPB-MBP: 2 g | INTRAVENOUS | @ 10:00:00 | Stop: 2024-05-02

## 2024-05-02 MED ADMIN — zinc acetate oral solution: 50 mg | GASTROENTERAL | @ 23:00:00 | Stop: 2024-05-06

## 2024-05-02 MED ADMIN — esomeprazole (NEXIUM) granules 40 mg: 40 mg | GASTROENTERAL | @ 13:00:00

## 2024-05-02 MED ADMIN — multivitamins, therapeutic with minerals tablet 1 tablet: 1 | GASTROENTERAL | @ 13:00:00

## 2024-05-02 MED ADMIN — lacosamide (VIMPAT) tablet 100 mg: 100 mg | GASTROENTERAL | @ 13:00:00

## 2024-05-02 MED ADMIN — lacosamide (VIMPAT) tablet 100 mg: 100 mg | GASTROENTERAL | @ 02:00:00

## 2024-05-02 MED ADMIN — piperacillin-tazobactam (ZOSYN) IVPB (premix) 4.5 g: 4.5 g | INTRAVENOUS | @ 23:00:00 | Stop: 2024-05-04

## 2024-05-02 MED ADMIN — furosemide (LASIX) injection 40 mg: 40 mg | INTRAVENOUS | @ 19:00:00

## 2024-05-02 MED ADMIN — furosemide (LASIX) injection 40 mg: 40 mg | INTRAVENOUS | @ 10:00:00

## 2024-05-02 NOTE — Progress Notes (Addendum)
 Adult Nutrition Progress Note    Visit Type: Follow-Up  Reason for Visit: Enteral Nutrition    NUTRITION INTERVENTIONS and RECOMMENDATION     Adjust Tube Feed regimen as below to relfect stepdown status:   Recommend Nutren 2.0 at goal rate 40 mL/hr. This provides 1680 kcals, 71 g protein, 182 g carbohydrate, 78 g fat, 0 g fiber, 580 mL free water , and meets 112% USRDI.  Recommend 1 packets of Prosource No Carb daily. This provides 15 g protein and 60 kcals.   Total nutrition: 1740 kcal, 86 g protein   FWF: Currently 100 mL q2hr, adjust prn per MD team based on goal FMS output. Minimum 30 ml q4h.   Monitor electrolytes and replete as indicated.  Micronutrients:   Low:   Vit D (13.7) - Continue 50,000 units weekly x8 doses   Monitor for pending copper  lab. Patient is s/p repletion.   Zinc  (36) - Continue 50 mg elemental zinc  daily x 14 doses   Continue multivitamin, thiamine  100 mg    Weekly weights    NUTRITION ASSESSMENT     Patient appropriate for enteral nutrition support given mechanical ventilation.   Would benefit to adjust tube feeds to better meet nutritional needs (re-estimated to reflect stepdown status).  Lactulose  being titrated for goal of [phone] mL output per day - FMS in place.   FWF appropriately being adjusted with goal output changes over clinical course.  Micronutrients as above.     NUTRITIONALLY RELEVANT DATA     HPI & PMH:   78 y.o. woman with HTN, MetALD cirrhosis, severe obesity (BMI > 40), persistent Afib, HFimpEF (40% 2017 --> >55% 02/2024), depression, right breast cancer (ER+) s/p partial mastectomy and adjuvant XRT (2022) now on anastrozole , who was initially admitted at [facility] on 03/08/2024 for encephalopathy following sedation for DCCV (aborted due to LAA thrombus). Complicated hospital course followed, with recurrent episodes of hypoxemic hypercarbic respiratory failure mostly due to persistent encephalopathy leading to hypoventilation, ultimately requiring tracheostomy. Now transferred out of MICU on 05/01/2024 for wean of ventilator support.     Nutrition Progress:   Visited patient at bedside. Patient transitioned to stepdown status and now on Hospitalist service as of 11/28. Remains on ventilator, medical ream planning to attempt to wean from vent. Continues tolerating tube feeds at goal. Nutren 2.0 running @35  ml/hr and noted prosource no carb modular packets at bedside (receiving 4 per day). Patient has received 2900 ml of tube feed formula last 72 hours, 90% of ordered.  FWF reduced 11/28 due to reduction in stool output. FMS output on 11/30: 800 ml. 600 ml FMS output so far today.       Medications:  Nutritionally pertinent medications reviewed and evaluated for potential food and/or medication interactions.   Nexium , cefepime , vitamin B 12, ergocalciferol  100 mcg daily, lasix , lactulose , pepcid , folvite , multivitamin, rifaximin , thiamine  100 mg, 50 mg elemental zinc  daily  S/p copper  repletion 11/25      Labs:   Nutritionally pertinent labs reviewed.   Cl (110), phos (2.9) GFR cystatin C 35   Copper  pending  Zinc  36 04/19/24  Vitamin D  13.7 04/18/24      Nutritional Needs:   Daily Estimated Nutrient Needs:  Energy: 1650-1925 kcals 30-35 kcal/kg using ideal body weight (given uncertain of dry weight), 55 kg (05/02/24 1513)]  Protein: 83-110 gm [1.5-2.0 gm/kg using ideal body weight, 55 kg (05/02/24 1513)]  Carbohydrate:   [45-60% of kcal]  Fluid:   mL [per MD team]  Compared to Adjusted PSU Equation for Ventillated Patients (BMI >/=30, Age >/= 60)  Tmax: 37.3 , Ve: 7.35  Energy: 1680 kcals/day (12/01)    Anthropometric Data:  Height: 162.6 cm (5' 4.02)   Admission weight: 87 kg (191 lb 12.8 oz)  Last recorded weight: (!) 111.3 kg (245 lb 6 oz) (05/01/24)  IBW: 54.53 kg  BMI: Body mass index is 42.1 kg/m??.   Usual Body Weight: Unable to obtain at this time   Weight Assessment: per below - trending down pta, though pt with cirrhosis & currently unknown dry wt. Increase over admit likely influenced by fluid - pt noted with 3+ pitting edema bilateral upper and lower extremities     Wt Readings from Last 10 Encounters:   05/01/24 (!) 111.3 kg (245 lb 6 oz)   03/07/24 89.3 kg (196 lb 12.8 oz)   03/03/24 89.1 kg (196 lb 6.4 oz)   04/23/23 96.6 kg (213 lb)   03/05/23 100.2 kg (220 lb 14.4 oz)   11/27/22 (!) 102.5 kg (225 lb 14.4 oz)   10/20/22 (!) 108 kg (238 lb)   07/17/22 (!) 108 kg (238 lb)   06/09/22 (!) 103.2 kg (227 lb 8 oz)   05/08/22 (!) 105.8 kg (233 lb 3.2 oz)     Malnutrition Assessment:  Malnutrition Assessment using AND/ASPEN or GLIM Clinical Characteristics:  Patient does not meet AND/ASPEN criteria for malnutrition at this time (03/16/24 1326)       Nutrition Focused Physical Exam:      Nutrition Evaluation  Overall Impressions: Nutrition-Focused Physical Exam not indicated due to lack of malnutrition risk factors. (03/16/24 1326)     Care plan:  Patient does not meet malnutrition criteria     Current Nutrition:  Enteral nutrition via NG tube  Nutrition Orders            Nutren 2.0 continuous tube feed starting at 11/28 1900    Separate Enteral Additives ProSource NoCarb (Protein Modular/Sugarfree); Quantity: 2 pkts BID starting at 11/04 2000          Nutritionally Pertinent Allergies, Intolerances, Sensitivities, and/or Cultural/Religious Restrictions:  none identified at this time     GOALS and EVALUATION     Patient to meet 80% or greater of nutritional needs via enteral nutrition while remains on tube feeds. - Meeting/Ongoing    Motivation, Barriers, and Compliance:  Evaluation of motivation, barriers, and compliance pending at this time due to clinical status.     Discharge Planning:   Monitor for potential discharge needs with multi-disciplinary team.          Follow-Up Parameters:   1-2 times per week (and more frequent as indicated)      Clotilda CHRISTELLA Carder, MPH, RDN, LDN  Pager: [phone]

## 2024-05-02 NOTE — Plan of Care (Signed)
 Patient on PSV. Patient will be placed back on a rate, PRVC VT 330, RR 12, +5, FiO2 25% tonight. Patient trach care and airway clearance  was completed.  No site break down;  Mepilex  was placed under the trach flange;   Patient has a moderate amount of thick yellow  tan secretions.  Ambu Bag  and Emergency  Trach supplies at the [facility]    Problem: Breathing Pattern Ineffective  Goal: Effective Breathing Pattern  Outcome: Ongoing - Unchanged  Intervention: Promote Improved Breathing Pattern  Recent Flowsheet Documentation  Taken 05/02/2024 1338 by Gretel Carlin BROCKS, RRT  Head of Bed [facility]) Positioning: HOB at 30-45 degrees  Taken 05/02/2024 0915 by Gretel Carlin BROCKS, RRT  Head of Bed [facility]) Positioning: HOB at 30-45 degrees     Problem: Gas Exchange Impaired  Goal: Optimal Gas Exchange  Outcome: Ongoing - Unchanged  Intervention: Optimize Oxygenation and Ventilation  Recent Flowsheet Documentation  Taken 05/02/2024 1338 by Gretel Carlin BROCKS, RRT  Head of Bed [facility]) Positioning: HOB at 30-45 degrees  Taken 05/02/2024 0915 by Gretel Carlin BROCKS, RRT  Head of Bed [facility]) Positioning: HOB at 30-45 degrees     Problem: Mechanical Ventilation Invasive  Goal: Optimal Device Function  Intervention: Optimize Device Care and Function  Recent Flowsheet Documentation  Taken 05/02/2024 0915 by Gretel Carlin BROCKS, RRT  Oral Care:   mouth swabbed   teeth brushed   tongue brushed  Goal: Absence of Ventilator-Induced Lung Injury  Intervention: Prevent Ventilator-Associated Pneumonia  Recent Flowsheet Documentation  Taken 05/02/2024 1338 by Gretel Carlin BROCKS, RRT  Head of Bed [facility]) Positioning: HOB at 30-45 degrees  Taken 05/02/2024 0915 by Gretel Carlin BROCKS, RRT  Head of Bed [facility]) Positioning: HOB at 30-45 degrees  Oral Care:   mouth swabbed   teeth brushed   tongue brushed

## 2024-05-02 NOTE — Progress Notes (Signed)
 Hospital Medicine Daily Progress Note    Assessment/Plan:    Principal Problem:    Bacteremia, escherichia coli  Active Problems:    Alcoholic liver disease (HHS-HCC)    HTN (hypertension)    Depression with anxiety    Class 2 severe obesity with serious comorbidity in adult    Atrial fibrillation    (CMS-HCC)    Pleural effusion    Hypernatremia    Thrombocytopenia    Cirrhosis    (CMS-HCC)    A-fib (CMS-HCC)    Hepatic encephalopathy    (CMS-HCC)    Anaphylaxis   Malnutrition Evaluation as performed by RD, LDN: Patient does not meet AND/ASPEN criteria for malnutrition at this time (03/16/24 1326)    Please see wound care flowsheets for wound documentation     LOS: 53 days (under inpatient status)    [name] is a 78 y.o. woman with HTN, MetALD cirrhosis, severe obesity (BMI > 40), persistent Afib, HFimpEF (40% 2017 --> >55% 02/2024), depression, right breast cancer (ER+) s/p partial mastectomy and adjuvant XRT (2022) now on anastrozole , who was initially admitted at [facility] on 03/08/2024 for encephalopathy following sedation for DCCV (aborted due to LAA thrombus). Complicated hospital course followed, with recurrent episodes of hypoxemic hypercarbic respiratory failure mostly due to persistent encephalopathy leading to hypoventilation, ultimately requiring tracheostomy. Now transferred out of MICU on 05/02/2024 for wean of ventilator support.     # Acute hypoxemic hypercarbic respiratory failure  # Trach/vent dependence  Failed extubation x 3 this admission; now has tracheostomy. Unable to tolerate sustained periods off volume-controlled mode so far. Poor mechanics, secretions, volume overload probably all a factor. Continues to require pressure support with minimal O2 needs.   -- first trach change by ENT done 04/17/2024  -- routine trach care per RT (#6 Shiley cuffed)  -- vent wean with help from ROAD team  -- Continue diuresis, Lasix  40 mg IV bid and monitor response    # Encephalopathy, multifactorial  At this point hard to tell what (if anything) is most important driver. Many metabolic derangements, long hospital stay, prolonged sedation for mechanical ventilation, cardiac arrest, etc etc. Lower concern for ongoing seizure but did previously have a period of subclinical seizure. Seem a bit worse (less awake and interactive) on 12/1 - suspect possibly worsening hepatic encephalopathy with some doses of lactulose  being held though has continued to pass stool.   -- Plan to check with daughter [name] on 12/2 how she looks compared to the last couple of weeks  -- Discussed continuing lactulose  to ensure adequate bowel movements, minimum stool daily by FMS  -- Check VBG  -- Continue to optimize electrolytes  -- Change antibiotics as below to avoid cefepime 's potential neurotoxicity  -- Control seizures as below - consider repeat EEG to rule out subclinical seizure   -- minimize centrally-acting medications as much as feasible  -- routine delirium ppx  -- repeat MRI brain w/wo this week to monitor cortical restricted diffusion seen on prior study    # Ventilator-associated pneumonia  Dx 04/28/2024. Tracheal aspirate growing MSSA, pan-susceptible Pseudomonas. Vancomycin  stopped after 48 hours.   -- Change cefepime  to IV zosyn  to complete 7 day course (end = 05/04/2024)    # Persistent atrial fibrillation  # LAA thrombus  Rates adequately controlled on current regimen  -- metoprolol  25 mg bid  -- apixaban  5 mg bid    # Chronic diastolic heart failure (HFimpEF)  Not an active issue; edema unlikely cardiogenic  in nature  -- strict intake / output    # Right internal jugular / carotid fistula, resolved  Apparently thrombosed; no flow on carotid doppler 03/26/2024  -- discuss with vascular surgery whether any follow-up needed before discharge    # Decompensated MetALD cirrhosis with hepatic encephalopathy  -- hepatology signed off; re-engage prn  -- titrate lactulose  to ~[phone] mL stool daily (per MICU plan)  -- continue rifaximin   -- continue multivitamin, thiamine     # Upper GI bleed due to Mallory-Weiss tear  -- continue PPI daily  -- famotidine  bid for GI ppx in critical illness (?)    # Fecal incontinence  -- rectal tube to remain for now given brisk stool output    # Nutrition  -- NG tube to remain for now. Will need to consider more durable feeding tube pending goals of care  -- tube feeds as below; provides 100% caloric needs    # Hypernatremia, improved  Recurring issue earlier in hospital course, now resolved with modifications to FWF volume.  -- Continue FWF 100 mL q2         # Anasarca  Due to prolonged critical illness, liver disease, etc  -- diuresis as above  -- Elevated extremities and wrap for edema as able    # Focal non-convulsive seizures  -- levetiracetam  750 mg bid  -- lacosamide  100 mg bid  -- re-consult neurology if breakthrough activity    # Disposition / GoC  See many prior ACP notes from prior teams including palliative care. Currently full code with plan for full restorative care IF there is possibility for functional recovery.   -- family meeting ~05/04/2024; spoke with daughter [name] on 12/1, she will coordinate with her sister  -- family declines transfer back to [facility] given rather traumatic experience there      Daily checklist:  VTE prophylaxis: therapeutic anticoagulation  Diet: Separate Enteral Additives ProSource NoCarb (Protein Modular/Sugarfree); Quantity: 1 packet daily  Nutren 2.0 continuous tube feed  Code status: Full Code  HCDM:   HCDM (patient stated preference): Crumble,[name]    HCDM (patient stated preference): Didonato,[name] - Daughter - [phone]    Disposition: stepdown status. Not medically ready for discharge. Anticipate discharge to [facility] for prolonged trach/vent wean unless goals of care change    PT/OT recommendations: Low intensity, 5x weekly (04/25/24) / To be determined (04/25/24)  DME needs: Defer to post acute (04/25/24) / Defer to post acute (04/25/24)    I personally spent 55 minutes face-to-face and non-face-to-face in the care of this patient, which includes all pre, intra, and post visit time on the date of service.  All documented time was specific to the E/M visit and does not include any procedures that may have been performed.    ___________________________________________________________________    Hospital course to date  See Dr. Creasie note 11/28 for excellent summary of complicated hospital course.   This AM was seen at bedside; no family present. Appears comfortable but did not interact, track, or follow commands. No other interval events or concerns. Spoke with [name] for medical update. She voiced that she was rather hopeful after seeing her yesterday - said that she's seen some progress with opening her eyes, looking at the TV, trying to mouth words. [place] that she 'doesn't want us  to give up on her' while she's making progress. Said that when she is worse its typically from holding lactulose  - she's had of stool recorded today despite  doses of lactulose  held today and yesterday. Plans to visit again tomorrow.     Labs/Studies:  Labs and Studies from the last 24hrs per EMR and Reviewed    Objective:  Temp:  [36.8 ??C (98.2 ??F)-37.3 ??C (99.2 ??F)] 36.9 ??C (98.4 ??F)  Pulse:  [86-112] 96  SpO2 Pulse:  [85-110] 99  Resp:  [11-25] 11  BP: (91-138)/(41-83) 130/47  FiO2 (%):  [25 %] 25 %  SpO2:  [95 %-99 %] 98 %    GEN: ill appearing woman lying in bed  HEENT: small-bore feeding tube present; lips dry  CV: irreg irreg, normal S1/S2, soft 1/6 systolic murmur  PULM: coarse rhonci bilaterally; trach in place wo secretions noted  ABD: obese, soft, nondistended, does not wince with palpation, +bowel sounds  EXT: gross anasarca diffusely  NEURO: rouses to voice, does not follow commands    LUE PICC present

## 2024-05-02 NOTE — Plan of Care (Signed)
 Problem: Mechanical Ventilation Invasive  Goal: Effective Communication  Outcome: Ongoing - Unchanged  Goal: Optimal Device Function  Outcome: Ongoing - Unchanged  Goal: Mechanical Ventilation Liberation  Outcome: Ongoing - Unchanged  Goal: Optimal Nutrition Delivery  Outcome: Ongoing - Unchanged  Goal: Absence of Device-Related Skin and Tissue Injury  Outcome: Ongoing - Unchanged  Goal: Absence of Ventilator-Induced Lung Injury  Outcome: Ongoing - Unchanged     Problem: Artificial Airway  Goal: Effective Communication  Outcome: Ongoing - Unchanged  Goal: Optimal Device Function  Outcome: Ongoing - Unchanged  Goal: Absence of Device-Related Skin or Tissue Injury  Outcome: Ongoing - Unchanged

## 2024-05-02 NOTE — Consults (Signed)
 Pt seen for OT session this date. This note is to address the consult received on 11/30 for LE edema - lower extremity edema wraps.    D/t patients LLE fluid weeping and RLE erythema + fluids weeping + wound, do not feel as though LE wraps are appropriate at this time d/t concern for skin breakdown with increased moisture on her skin when wraps are donned.    Appreciate the consult and have communicated directly with pts MD via epic chat regarding LE wraps and further OT plan moving forward to address concerns. Will continue with POC at this time.

## 2024-05-02 NOTE — Plan of Care (Signed)
 Shift Summary  Airway suction was performed during the shift to maintain effective breathing pattern.    Tracheostomy site and inner cannula received significant care, including dressing change and new ties.    Non-verbal pain assessments consistently indicated comfort and absence of distress.    Skin integrity was supported by frequent repositioning, absorbent pad changes, and use of pressure-redistributing devices.    Infection prevention and safety interventions were maintained, and no hospital-acquired injuries were documented.     Skin Health and Integrity: Frequent repositioning, absorbent pad changes, limited adhesive use, and cleansing with dimethicone wipes were maintained throughout the shift; pressure-redistributing mattress and positioning supports were consistently utilized to protect skin and tissue health. No new skin issues were documented during the shift.     Absence of Hospital-Acquired Illness or Injury: Aseptic technique, infection management, and environmental surveillance were maintained, with hand hygiene and PPE consistently promoted; aspiration and fall precautions were in place, and no hospital-acquired injuries were documented.     Optimal Comfort and Wellbeing: Non-verbal pain assessments indicated relaxed facial muscles and normal tone, with no abnormal vocalizations and a NAPS score of 0 throughout the shift, suggesting comfort was maintained.     Effective Breathing Pattern: Respiratory rate fluctuated but remained within a manageable range, with unlabored breathing documented and airway suction performed; ventilator support and ETCO2 values were stable, and tracheostomy remained patent with site care provided.

## 2024-05-03 MED ADMIN — sodium chloride (NS) 0.9 % flush 10 mL: 10 mL | INTRAVENOUS | @ 22:00:00

## 2024-05-03 MED ADMIN — sodium chloride (NS) 0.9 % flush 10 mL: 10 mL | INTRAVENOUS | @ 14:00:00

## 2024-05-03 MED ADMIN — sodium chloride (NS) 0.9 % flush 10 mL: 10 mL | INTRAVENOUS | @ 01:00:00

## 2024-05-03 MED ADMIN — sodium chloride (NS) 0.9 % flush 10 mL: 10 mL | INTRAVENOUS | @ 08:00:00

## 2024-05-03 MED ADMIN — rifAXIMin (XIFAXAN) oral suspension: 550 mg | GASTROENTERAL | @ 03:00:00 | Stop: 2025-04-11

## 2024-05-03 MED ADMIN — rifAXIMin (XIFAXAN) oral suspension: 550 mg | GASTROENTERAL | @ 14:00:00 | Stop: 2025-04-11

## 2024-05-03 MED ADMIN — lactulose oral solution: 30 g | GASTROENTERAL | @ 18:00:00

## 2024-05-03 MED ADMIN — lactulose oral solution: 30 g | GASTROENTERAL | @ 14:00:00

## 2024-05-03 MED ADMIN — folic acid (FOLVITE) tablet 1 mg: 1 mg | GASTROENTERAL | @ 14:00:00

## 2024-05-03 MED ADMIN — metoPROLOL tartrate (Lopressor) tablet 25 mg: 25 mg | GASTROENTERAL | @ 03:00:00

## 2024-05-03 MED ADMIN — thiamine mononitrate (vit B1) tablet 100 mg: 100 mg | GASTROENTERAL | @ 14:00:00

## 2024-05-03 MED ADMIN — ergocalciferol (DRISDOL) oral drops 200 mcg/mL (8,000 unit/mL): 100 ug | GASTROENTERAL | @ 14:00:00 | Stop: 2024-05-21

## 2024-05-03 MED ADMIN — cyanocobalamin (vitamin B-12) tablet 1,000 mcg: 1000 ug | GASTROENTERAL | @ 14:00:00

## 2024-05-03 MED ADMIN — levETIRAcetam (KEPPRA) tablet 750 mg: 750 mg | GASTROENTERAL | @ 14:00:00

## 2024-05-03 MED ADMIN — levETIRAcetam (KEPPRA) tablet 750 mg: 750 mg | GASTROENTERAL | @ 03:00:00

## 2024-05-03 MED ADMIN — famotidine (PEPCID) tablet 20 mg: 20 mg | GASTROENTERAL | @ 14:00:00

## 2024-05-03 MED ADMIN — melatonin tablet 3 mg: 3 mg | GASTROENTERAL | @ 22:00:00

## 2024-05-03 MED ADMIN — apixaban (ELIQUIS) tablet 5 mg: 5 mg | GASTROENTERAL | @ 14:00:00

## 2024-05-03 MED ADMIN — apixaban (ELIQUIS) tablet 5 mg: 5 mg | GASTROENTERAL | @ 03:00:00

## 2024-05-03 MED ADMIN — zinc acetate oral solution: 50 mg | GASTROENTERAL | @ 22:00:00 | Stop: 2024-05-06

## 2024-05-03 MED ADMIN — esomeprazole (NEXIUM) granules 40 mg: 40 mg | GASTROENTERAL | @ 14:00:00

## 2024-05-03 MED ADMIN — multivitamins, therapeutic with minerals tablet 1 tablet: 1 | GASTROENTERAL | @ 14:00:00

## 2024-05-03 MED ADMIN — lacosamide (VIMPAT) tablet 100 mg: 100 mg | GASTROENTERAL | @ 03:00:00

## 2024-05-03 MED ADMIN — lacosamide (VIMPAT) tablet 100 mg: 100 mg | GASTROENTERAL | @ 14:00:00

## 2024-05-03 MED ADMIN — piperacillin-tazobactam (ZOSYN) IVPB (premix) 4.5 g: 4.5 g | INTRAVENOUS | @ 03:00:00 | Stop: 2024-05-04

## 2024-05-03 MED ADMIN — piperacillin-tazobactam (ZOSYN) IVPB (premix) 4.5 g: 4.5 g | INTRAVENOUS | @ 14:00:00 | Stop: 2024-05-04

## 2024-05-03 MED ADMIN — piperacillin-tazobactam (ZOSYN) IVPB (premix) 4.5 g: 4.5 g | INTRAVENOUS | @ 09:00:00 | Stop: 2024-05-04

## 2024-05-03 MED ADMIN — piperacillin-tazobactam (ZOSYN) IVPB (premix) 4.5 g: 4.5 g | INTRAVENOUS | @ 21:00:00 | Stop: 2024-05-04

## 2024-05-03 MED ADMIN — albumin human 5 % 25 g: 25 g | INTRAVENOUS | @ 18:00:00 | Stop: 2024-05-03

## 2024-05-03 MED ADMIN — furosemide (LASIX) injection 40 mg: 40 mg | INTRAVENOUS | @ 11:00:00

## 2024-05-03 NOTE — Consults (Signed)
 Palliative Care Progress Note        Consultation from Requesting Attending Physician:  [name] Deanne Franco, MD  Primary Care Provider:  Alyse Slater Pao, MD      Assessment/Plan:      SUMMARY:  This 78 y.o. 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      Symptom Assessment and Recommendations:    - Symptoms currently being managed by primary team as palliative consulted for assistance with goals of care.     Goals of care / ACP:  Code Status:   Code Status: Full Code   Healthcare decision-maker if lacks capacity:   HCDM (patient stated preference): [name],[name] - Spouse - [phone]    HCDM (patient stated preference): [name],[name] - Daughter - [phone]     12/1: Unable to connect with family today. Planning to be available for multidisciplinary meeting with family tomorrow. Tentatively planned for 4pm.     Goals of care as discussed on 11/18:  - [name] shares this has been a tough week as her mom has gotten sicker, and her dad has made it out of rehab and is living with her, requiring a lot of care and supervision.  She is continuing to work as well and care for her children.  - She has heard that her mom's mental status has worsened and she has not been waking up.  She has heard there is concern about her ammonia levels, and potential seizures.  - She is worried, and at the same time holding out hope still that her mom could improve from this  - She is amenable to a family meeting with PC and ICU teams, although unsure of timing due to her work schedule and other responsibilities    Goals of care as discussed on 11/12:  - Please see ACP note from same day for further details.  - In brief, presented family with options to proceed with tracheostomy versus extubation without a plan for reintubation if she were to fail (plan at that time would be to transition to comfort focused care and allow natural death)  - Family affirmed [name]'s ultimate goals would be to eventually get back home and lead a functional life.  They believe she would be willing to go through a prolonged hospitalization, tracheostomy and rehab if there is some hope of reaching that goal.  ICU team shared that they thought while the road ahead would be long and difficult, but this was definitely possible and that recent indicators have showed that [name] is slowly improving.  - Patient and family ultimately decided to proceed with tracheostomy    Goals of care as discussed on 11/10:  - [name]'s family shares this has been a long and difficult hospitalization, with lots of unexpected decompensations  - They share that before this hospitalization, [name] was doing well at home.  She was independent.  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  This is a MAJOR change for her.  - The ICU team shares that in the past, there have been questions from the family about if they would offer extubation and reintubation if indicated for a 5th time (as an alternative to proceeding straight to tracheostomy).  They would likely not offer this due to substantial risks without much benefit.    Practical, Emotional, Spiritual Support Recommendations:  Patient's daughters would like the patient to be included in decision making as much as possible    Recommendations discussed with primary team via Epic chat    Thank you for this consult. Please contact [name] Favorite MD or page Palliative Care if there are any questions.  Palliative Care team will continue to follow.    Subjective:     Recent Events:  NAEON. Pt remains on vent support via trach. Patient not responsive to voice or touch on my visit. Exam notable for significant peripheral edema in upper and lower extremities.       Objective:       Function:  30% - Ambulation: Totally Bed Bound / Unable to do any work, extensive disease / Self-Care: Total care / Intake: Reduced / Level of Conscious: Full, drowsy, or confusion    Temp:  [36.1 ??C (97 ??F)-37.8 ??C (100.1 ??F)] 37.3 ??C (99.1 ??F)  Pulse:  [83-105] 87  SpO2 Pulse:  [80-103] 90  Resp:  [11-31] 14  BP: (89-150)/(38-66) 89/38  FiO2 (%):  [25 %] 25 %  SpO2:  [93 %-100 %] 99 %    Physical Exam:  General:  Ill appearing woman in NAD, non responsive to verbal/light tactile stimulation   HEENT:  s/p trach, connected to vent  Cardiovascular:  No evidence of cyanosis, RRR  Pulmonary:  Rhonchorous breath sounds on ventilator  Gastrointestinal:  Distended  Ext:  Worsening pitting edema bilateral LE, also present in UE    Testing reviewed and interpreted:  Reviewed and interpreted test results for cbc with anemia; bmp with nl renal function, low albumin  affecting assessment of underlying illness severity and prognosis    I personally spent 35 minutes face-to-face and non-face-to-face in the care of this patient, which includes all pre, intra, and post visit time on the date of service.  All documented time was specific to the E/M visit and does not include any procedures that may have been performed.     See ACP Note from today for additional billable service: No.    [name] Gola, MD  Hospice/Palliative Care

## 2024-05-03 NOTE — Consults (Signed)
 WIND Team Initial Consult Note     Date of Service: 05/03/2024  Requesting Physician: Therisa Deanne Franco, MD   Requesting Service: [facility])  Reason for consultation: Comprehensive evaluation of tracheostomy management and mechanical ventilation.    Hospital Problems:  Principal Problem:    Bacteremia, escherichia coli  Active Problems:    Alcoholic liver disease (HHS-HCC)    HTN (hypertension)    Depression with anxiety    Class 2 severe obesity with serious comorbidity in adult    Atrial fibrillation    (CMS-HCC)    Pleural effusion    Hypernatremia    Thrombocytopenia    Cirrhosis    (CMS-HCC)    A-fib (CMS-HCC)    Hepatic encephalopathy    (CMS-HCC)    Anaphylaxis      HPI: [name] is a 78 y.o. 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 Now transferred out of MICU on 05/02/2024 for wean of ventilator support.       Recommendations       Planned Vent Settings/Goals:  Pressure support 14 cm H2O with PEEP 5 cm H2O.; continue to wean pressure support as tolerated.   - Ultimately would like to trial on [facility] however mental status is of concern and limiting her ability to wean.     - Tracheostomy  6 shiley  - Barriers to wean include mental status    #Nutrition Plan:   Continue enteral nutrition.    #Speech Plan:  Speaking valve when off vent. Still not ready    #Physical Therapy plan:   Mobilize as tolerated.           This patient was seen and evaluated with Dr.Dover. Please do not hesitate to page [phone] Monday - Friday from 8AM-3PM with questions. We appreciate the opportunity to assist in the care of this patient. The recommendations outlined in this note were discussed w the primary team via epic chat. We look forward to following with you.     [name], MD    Assessment          Impression: The patient has acute illness with systemic symptoms. My interpretation of the interval data I personally reviewed, interpreted and/or analyzed is notable for vent settings with FiO2 25%, PSV 14/5 with good oxygenation and gas exchange..      Problems addressed during this consult include Vent mgmt.       Subjective & Objective     Subjective: Resting in bed, not responsive to voice or tactile stimulation         Vitals - past 24 hours  Temp:  [36.1 ??C (97 ??F)-37.8 ??C (100.1 ??F)] 37.1 ??C (98.8 ??F)  Pulse:  [83-105] 102  SpO2 Pulse:  [80-103] 102  Resp:  [14-31] 24  BP: (89-150)/(38-66) 118/59  FiO2 (%):  [25 %] 25 %  SpO2:  [93 %-100 %] 98 % Intake/Output  I/O last 3 completed shifts:  In: 2790 [P.O.:60; NG/GT:2330; IV Piggyback:400]  Out: 3250 [Urine:2300; Stool:950]      Pertinent exam findings:    General - NAD  HEENT - Trachea midline, anicteric  CV - RRR  Resp - PSV 14/5, equal chest rise.   GI - abdomen soft, non-tender non-distended  Skin -  no clubbing or cyanosis    Pertinent Imaging Data   - CXR on 04/28/24 revealed bilateral effusions with fluid in the R. Pleural fissure.       Current vent settings:  S RR:  [12] 12  FiO2 (%):  [25 %] 25 %  PC Set:  [15] 15  PR SUP:  [14 cm H20-16 cm H20] 14 cm H20  O2 Device: Ventilator    Arterial Blood Gas:   No results for input(s): SPECTYPEART, PHART, PCO2ART, PO2ART, HCO3ART, BEART, O2SATART in the last 24 hours.     Venous Blood Gas:   No results for input(s): PHVEN, PCO2VEN, PO2VEN, HCO3VEN, BEVEN, O2SATVEN in the last 24 hours.     Cultures:  Blood Culture, Routine (no units)   Date Value   04/17/2024 No Growth at 5 days   04/17/2024 No Growth at 5 days     Urine Culture, Comprehensive (no units)   Date Value   04/17/2024 NO GROWTH   04/09/2024 NO GROWTH     Lower Respiratory Culture (no units)   Date Value   04/28/2024 2+ Methicillin-susceptible Staphylococcus aureus (A)   04/28/2024 2+ Pseudomonas aeruginosa (A)     WBC (10*9/L)   Date Value   05/02/2024 6.6     WBC, UA (/HPF)   Date Value   04/17/2024 1          Allergies & Home Medications   Personally reviewed in Epic    Medications:  Personally reviewed in Epic, see recommendations for updates           Hospital Course:   - Refer to primary team notes.     Medical Decision Making on 05/03/2024     External Notes:    I reviewed the following: Progress note(s) , Consultant note(s) , Nursing note(s), and Therapist note(s)    Results:   I reviewed the following labs/reports for this consult - CBC unremarkable or unchanged from prior., chemistry unremarkable or unchanged from prior., and radiology reports unremarkable or unchanged from prior.    Tests Ordered:   See recommendations above    Historian: no independent historian required    Imaging: I independently viewed and interpreted the following studies for this consult - CXR images  and CT images       Risks of management:    - Management of ventilator settings.  - Management of airway clearance devices/medications.

## 2024-05-03 NOTE — Plan of Care (Signed)
 Problem: Breathing Pattern Ineffective  Goal: Effective Breathing Pattern  Outcome: Ongoing - Unchanged     Problem: Gas Exchange Impaired  Goal: Optimal Gas Exchange  Outcome: Ongoing - Unchanged     Problem: Mechanical Ventilation Invasive  Goal: Optimal Device Function  Outcome: Ongoing - Unchanged  Goal: Mechanical Ventilation Liberation  Outcome: Ongoing - Unchanged   Pt continuing on ps trial from this morning able to titrate down to 12/5 after improved spont vt, maintaining spont vt avg 300 to , Trach patent and secure. Secretions moderate amounts. Will continue to wean per pt tolerance.

## 2024-05-03 NOTE — Plan of Care (Signed)
 Shift Summary  Lower respiratory culture identified multiple organisms, including Methicillin-susceptible Staphylococcus aureus and Pseudomonas aeruginosa, with susceptibilities documented.   Ventilator support and airway suction were maintained throughout the shift, with respiratory parameters monitored closely.   Pressure reduction and skin protection interventions were consistently performed, including frequent repositioning and use of protective devices.   Remained dependent for all activities of daily living, with staff providing necessary hygiene and care.   Overall, infection prevention and safety measures were maintained, and respiratory and skin status were closely monitored.     Skin Health and Integrity: Pressure reduction techniques and devices were consistently utilized throughout the shift, with frequent repositioning and use of protective equipment; perineal care and skin protection interventions were maintained, and incontinence pads were used at all documented times.     Absence of Hospital-Acquired Illness or Injury: Aseptic technique and infection prevention measures were maintained, including environmental surveillance, equipment disinfection, and use of personal protective equipment; no new hospital-acquired injuries were documented.     Improved Ability to Complete Activities of Daily Living: Remained completely dependent for all activities of daily living, including bathing and oral care, with staff assistance provided for hygiene tasks.     Effective Breathing Pattern: Respiratory rate fluctuated throughout the shift, with periods of both elevated and decreased rates; airway suction was performed and ventilator support was maintained.     Optimal Gas Exchange: SpO2 levels remained consistently high and FiO2 was stable at 25% while on ventilator support, with minute volume and ETCO2 values showing some variability but no sustained abnormal trends.

## 2024-05-03 NOTE — Progress Notes (Signed)
 Hospital Medicine Daily Progress Note    Assessment/Plan:  [name] is a 78 y.o. female with Pmhx notable for HTN, MetALD cirrhosis, severe obseity, and persistent a fib who presented to [facility] initially for encephalopathy following an aborted DCCV (LAA thrombus found). She subsequently had a complex hospital stay with multiple ICU admissions, PEA arrest, multiple intubations and eventually tracheostomy. Currently weaning trach, and providing NGT feeds, but mental status continues to be poor    Acute hypoxemic and hypercarbic respiratory failure  Trach/vent depedent  D/w RT and weaning team. Plan to decrease pressure support very slowly, as she has become very hypercarbic in the past with a faster wean. Only on 25% FiO2, encephalopathy, poor mechanics, and volume overload might all be contributing   - Routine trach care per RT  - Appreciate assistance with vent weaning  - VBG QAM since we are adjusting vent settings.   - Lasix  40mg  BID as BP tolerates    Hypotension:  Recurrent issue, had septic shock earlier in hospitalization. Today, suspect 2/2 over diuresis.  - Improved with albumin   - Hold PM dose of diuretic, reassess in AM    Encephalopathy, multifactorial:  Focal non-convulsive seizures  Multiple drivers, prolonged ICU stays, cardiac arrest, metabolic derangements have been corrected, likely some hepatic encephalopathy. Did have subclinical seizures earlier in her course as well. Cefepime  also possible contributing. Avoiding other centrally acting medications   - Continue Lactulose , only hold if >1076ml stool via FMS  - Ongoing re-evalaution especially with family at bedside as able  - Per prior neuro recommendations, repeat MRI brain ~12/4 to re-eval previously noted cortical restricted diffusion  - Continue Levetiracetam  750mg  BID and Lacosamide  100mg  BID    Anasarca:  2/2 prolonged critical illness, underlying cirrhosis, and hypoalbuminemia  - Lasix  40mg  BID as tolerated     VAP 2/2 PsA and MSSA  Plan to complete total of 7 days through 12/3  - Continue Pip/tazo for now    Persistent A fib  LAA thrombus:  Rate controlled  - Metop 25mg  BID  - Apixaban  5mg  BID     Decompensated MetALD cirrhosis   - Volume: anasarca as above, multiple POCUS evals without ascites  - Infection: no e/o SBP  - Bleeding: had MWT earlier in course, continue PPI BID, no e/o varices  - Encephalopathy: Rifaxamin and Lactulose  to goal of stool output via FMS    Severe protein calorie malnutrition  Hypernatremia, resolved  - Continue feeds via NGT, depending on GOC, and LTAC abilities, may need to pursue PEG  - FWF q2h     Chronic diastolic heart failure (HFimpEF)  Not an active issue; edema unlikely cardiogenic in nature  -- strict intake / output     Right internal jugular / carotid fistula, resolved  Apparently thrombosed; no flow on carotid doppler 03/26/2024  -- discuss with vascular surgery whether any follow-up needed before discharge    GOC:  Multiple prior ACP discussions. Appreciate palliative care input. Goal is for functional recovery, unfortunately mental status appears to be very waxing/waning and has been worse the last several days  - Plan for family meeting 12/3 with [facility] team     Advanced care planning  - Code status: Full  - Healthcare proxy: daughter, [name]  - Disposition: LTACH - CM looking into if they can take her with an NGT  PT/OT recommendations: 5xlow     FEN/GI/PPX  - Diet: Nutren 2.0 continuous TF   - IVF: None  -  Bowel regimen: lactulose   - DVT ppx: On therapeutic AC    I personally spent 55 minutes face-to-face and non-face-to-face in the care of this patient, which includes all pre, intra, and post visit time on the date of service.  All documented time was specific to the E/M visit and does not include any procedures that may have been performed.    Therisa Deanne Franco, MD  Assistant Professor of Medicine    ___________________________________________________________________    Subjective:  No issues from d/w RT or RN. Minimal secretions from trach. Pt grimaces to sternal rub, but otherwise not interactive. Later in morning BP lower, no change in clinical status, had UOP after lasix  and ~26ml stool so far    Labs/Studies:  Labs and Studies from the last 24hrs per EMR and Reviewed    Objective:  Temp:  [36.1 ??C (97 ??F)-37.8 ??C (100.1 ??F)] 37.3 ??C (99.1 ??F)  Pulse:  [83-105] 102  SpO2 Pulse:  [80-103] 102  Resp:  [14-31] 24  BP: (89-150)/(38-66) 118/59  FiO2 (%):  [25 %] 25 %  SpO2:  [93 %-100 %] 98 %    Gen: lying in bed, does not respond except to grimace to sternal rub  HEENT: Dry MM, NGT in place with feeds running, trach in place, on PS on vent, miniimal secretions  CV: irregular, S1/S2, no MRG  Pulm: CTAB, no WRR  Abd: soft, non-tender, normal BS  Ext: diffuse anasarca, legs are weeping, LUE PICC in place   Neuro: somnolent, grimaces to sternal rubb

## 2024-05-03 NOTE — Plan of Care (Addendum)
 Pt revived on pressure support, trache care completed during shift. Will continue to monitor.  Problem: Mechanical Ventilation Invasive  Goal: Effective Communication  Outcome: Ongoing - Unchanged  Goal: Optimal Device Function  Outcome: Ongoing - Unchanged  Goal: Mechanical Ventilation Liberation  Outcome: Ongoing - Unchanged

## 2024-05-03 NOTE — Plan of Care (Signed)
 UTA mental status, pt was able to track me across the room but unable to squeeze fingers/follow commands. On Vent 25%, A fib 80s-100, FMS intact, corpak intact running feeds, PICC intact. Skin remains highly edematous and weeping. Standard precautions maintained, no falls/injuries this shift. All monitors with appropriate alarm settings, see flowsheets/MAR for further info.

## 2024-05-04 LAB — CBC W/ AUTO DIFF
BASOPHILS ABSOLUTE COUNT: 0 10*9/L (ref 0.0–0.1)
BASOPHILS RELATIVE PERCENT: 0.5 %
EOSINOPHILS ABSOLUTE COUNT: 0.2 10*9/L (ref 0.0–0.5)
EOSINOPHILS RELATIVE PERCENT: 2.9 %
HEMATOCRIT: 26.6 % — ABNORMAL LOW (ref 34.0–44.0)
HEMOGLOBIN: 8.6 g/dL — ABNORMAL LOW (ref 11.3–14.9)
LYMPHOCYTES ABSOLUTE COUNT: 1.2 10*9/L (ref 1.1–3.6)
LYMPHOCYTES RELATIVE PERCENT: 17.6 %
MEAN CORPUSCULAR HEMOGLOBIN CONC: 32.4 g/dL (ref 32.0–36.0)
MEAN CORPUSCULAR HEMOGLOBIN: 30.3 pg (ref 25.9–32.4)
MEAN CORPUSCULAR VOLUME: 93.7 fL (ref 77.6–95.7)
MEAN PLATELET VOLUME: 8.8 fL (ref 6.8–10.7)
MONOCYTES ABSOLUTE COUNT: 0.7 10*9/L (ref 0.3–0.8)
MONOCYTES RELATIVE PERCENT: 9.7 %
NEUTROPHILS ABSOLUTE COUNT: 4.7 10*9/L (ref 1.8–7.8)
NEUTROPHILS RELATIVE PERCENT: 69.3 %
PLATELET COUNT: 169 10*9/L (ref 150–450)
RED BLOOD CELL COUNT: 2.84 10*12/L — ABNORMAL LOW (ref 3.95–5.13)
RED CELL DISTRIBUTION WIDTH: 18.4 % — ABNORMAL HIGH (ref 12.2–15.2)
WBC ADJUSTED: 6.8 10*9/L (ref 3.6–11.2)

## 2024-05-04 LAB — BASIC METABOLIC PANEL
ANION GAP: 11 mmol/L (ref 5–14)
ANION GAP: 11 mmol/L (ref 5–14)
BLOOD UREA NITROGEN: 17 mg/dL (ref 9–23)
BLOOD UREA NITROGEN: 15 mg/dL (ref 9–23)
BUN / CREAT RATIO: 41
BUN / CREAT RATIO: 36
CALCIUM: 7.8 mg/dL — ABNORMAL LOW (ref 8.7–10.4)
CALCIUM: 7.8 mg/dL — ABNORMAL LOW (ref 8.7–10.4)
CHLORIDE: 102 mmol/L (ref 98–107)
CHLORIDE: 104 mmol/L (ref 98–107)
CO2: 29 mmol/L (ref 20.0–31.0)
CO2: 28 mmol/L (ref 20.0–31.0)
CREATININE: 0.41 mg/dL — ABNORMAL LOW (ref 0.55–1.02)
CREATININE: 0.42 mg/dL — ABNORMAL LOW (ref 0.55–1.02)
EGFR CKD-EPI (2021) FEMALE: >90 mL/min/1.73m2 (ref >=60)
EGFR CKD-EPI (2021) FEMALE: >90 mL/min/1.73m2 (ref >=60)
GLUCOSE RANDOM: 130 mg/dL (ref 70–179)
GLUCOSE RANDOM: 153 mg/dL (ref 70–179)
POTASSIUM: 2.7 mmol/L — ABNORMAL LOW (ref 3.4–4.8)
POTASSIUM: 3.2 mmol/L — ABNORMAL LOW (ref 3.4–4.8)
SODIUM: 142 mmol/L (ref 135–145)
SODIUM: 143 mmol/L (ref 135–145)

## 2024-05-04 LAB — MAGNESIUM: MAGNESIUM: 1.9 mg/dL (ref 1.6–2.6)

## 2024-05-04 LAB — HEPATIC FUNCTION PANEL
ALBUMIN: 2.3 g/dL — ABNORMAL LOW (ref 3.4–5.0)
ALKALINE PHOSPHATASE: 224 U/L — ABNORMAL HIGH (ref 46–116)
ALT (SGPT): 16 U/L (ref 10–49)
AST (SGOT): 46 U/L — ABNORMAL HIGH (ref <=34)
BILIRUBIN DIRECT: 0.4 mg/dL — ABNORMAL HIGH (ref 0.00–0.30)
BILIRUBIN TOTAL: 0.8 mg/dL (ref 0.3–1.2)
PROTEIN TOTAL: 5.8 g/dL (ref 5.7–8.2)

## 2024-05-04 LAB — BLOOD GAS, VENOUS
BASE EXCESS VENOUS: 3.8 — ABNORMAL HIGH (ref -2.0–2.0)
CARBOXYHEMOGLOBIN, VENOUS: 1.7 % — ABNORMAL HIGH (ref <1.2)
HCO3 VENOUS: 28 mmol/L — ABNORMAL HIGH (ref 22–27)
METHEMOGLOBIN, VENOUS: <1 % (ref <1.5)
O2 SATURATION VENOUS: 65.2 % (ref 40.0–85.0)
OXYHEMOGLOBIN, VENOUS: 63.7 % (ref 40.0–85.0)
PCO2 VENOUS: 42 mmHg (ref 40–60)
PH VENOUS: 7.44 — ABNORMAL HIGH (ref 7.32–7.43)
PO2 VENOUS: 38 mmHg (ref 30–55)

## 2024-05-04 LAB — PHOSPHORUS: PHOSPHORUS: 2 mg/dL — ABNORMAL LOW (ref 2.4–5.1)

## 2024-05-04 MED ADMIN — sodium chloride (NS) 0.9 % flush 10 mL: 10 mL | INTRAVENOUS | @ 14:00:00

## 2024-05-04 MED ADMIN — sodium chloride (NS) 0.9 % flush 10 mL: 10 mL | INTRAVENOUS | @ 22:00:00

## 2024-05-04 MED ADMIN — sodium chloride (NS) 0.9 % flush 10 mL: 10 mL | INTRAVENOUS | @ 08:00:00

## 2024-05-04 MED ADMIN — rifAXIMin (XIFAXAN) oral suspension: 550 mg | GASTROENTERAL | @ 14:00:00 | Stop: 2025-04-11

## 2024-05-04 MED ADMIN — rifAXIMin (XIFAXAN) oral suspension: 550 mg | GASTROENTERAL | @ 03:00:00 | Stop: 2025-04-11

## 2024-05-04 MED ADMIN — lactulose oral solution: 30 g | GASTROENTERAL | @ 14:00:00

## 2024-05-04 MED ADMIN — lactulose oral solution: 30 g | GASTROENTERAL | @ 03:00:00

## 2024-05-04 MED ADMIN — folic acid (FOLVITE) tablet 1 mg: 1 mg | GASTROENTERAL | @ 14:00:00

## 2024-05-04 MED ADMIN — metoPROLOL tartrate (Lopressor) tablet 25 mg: 25 mg | GASTROENTERAL | @ 14:00:00

## 2024-05-04 MED ADMIN — metoPROLOL tartrate (Lopressor) tablet 25 mg: 25 mg | GASTROENTERAL | @ 03:00:00

## 2024-05-04 MED ADMIN — thiamine mononitrate (vit B1) tablet 100 mg: 100 mg | GASTROENTERAL | @ 14:00:00

## 2024-05-04 MED ADMIN — ergocalciferol (DRISDOL) oral drops 200 mcg/mL (8,000 unit/mL): 100 ug | GASTROENTERAL | @ 14:00:00 | Stop: 2024-05-21

## 2024-05-04 MED ADMIN — cyanocobalamin (vitamin B-12) tablet 1,000 mcg: 1000 ug | GASTROENTERAL | @ 14:00:00

## 2024-05-04 MED ADMIN — levETIRAcetam (KEPPRA) tablet 750 mg: 750 mg | GASTROENTERAL | @ 14:00:00

## 2024-05-04 MED ADMIN — levETIRAcetam (KEPPRA) tablet 750 mg: 750 mg | GASTROENTERAL | @ 03:00:00

## 2024-05-04 MED ADMIN — famotidine (PEPCID) tablet 20 mg: 20 mg | GASTROENTERAL | @ 14:00:00

## 2024-05-04 MED ADMIN — melatonin tablet 3 mg: 3 mg | GASTROENTERAL | @ 22:00:00

## 2024-05-04 MED ADMIN — apixaban (ELIQUIS) tablet 5 mg: 5 mg | GASTROENTERAL | @ 03:00:00

## 2024-05-04 MED ADMIN — apixaban (ELIQUIS) tablet 5 mg: 5 mg | GASTROENTERAL | @ 14:00:00

## 2024-05-04 MED ADMIN — zinc acetate oral solution: 50 mg | GASTROENTERAL | @ 22:00:00 | Stop: 2024-05-06

## 2024-05-04 MED ADMIN — esomeprazole (NEXIUM) granules 40 mg: 40 mg | GASTROENTERAL | @ 14:00:00

## 2024-05-04 MED ADMIN — multivitamins, therapeutic with minerals tablet 1 tablet: 1 | GASTROENTERAL | @ 14:00:00

## 2024-05-04 MED ADMIN — lacosamide (VIMPAT) tablet 100 mg: 100 mg | GASTROENTERAL | @ 14:00:00

## 2024-05-04 MED ADMIN — lacosamide (VIMPAT) tablet 100 mg: 100 mg | GASTROENTERAL | @ 03:00:00

## 2024-05-04 MED ADMIN — piperacillin-tazobactam (ZOSYN) IVPB (premix) 4.5 g: 4.5 g | INTRAVENOUS | @ 15:00:00 | Stop: 2024-05-04

## 2024-05-04 MED ADMIN — piperacillin-tazobactam (ZOSYN) IVPB (premix) 4.5 g: 4.5 g | INTRAVENOUS | @ 03:00:00 | Stop: 2024-05-04

## 2024-05-04 MED ADMIN — piperacillin-tazobactam (ZOSYN) IVPB (premix) 4.5 g: 4.5 g | INTRAVENOUS | @ 09:00:00 | Stop: 2024-05-04

## 2024-05-04 MED ADMIN — potassium chloride 20 mEq in 100 mL IVPB Premix: 20 meq | INTRAVENOUS | @ 16:00:00 | Stop: 2024-05-04

## 2024-05-04 MED ADMIN — potassium & sodium phosphates 250mg (PHOS-NAK/NEUTRA PHOS) packet 2 packet: 2 | ORAL | @ 22:00:00 | Stop: 2024-05-04

## 2024-05-04 MED ADMIN — potassium chloride 20 mEq in 100 mL IVPB Premix: 20 meq | INTRAVENOUS | @ 14:00:00 | Stop: 2024-05-04

## 2024-05-04 MED ADMIN — potassium chloride 20 mEq in 100 mL IVPB Premix: 20 meq | INTRAVENOUS | @ 13:00:00 | Stop: 2024-05-04

## 2024-05-04 MED ADMIN — furosemide (LASIX) injection 40 mg: 40 mg | INTRAVENOUS | @ 16:00:00 | Stop: 2024-05-04

## 2024-05-04 MED ADMIN — potassium chloride (KLOR-CON) packet 40 mEq: 40 meq | ORAL | @ 21:00:00 | Stop: 2024-05-04

## 2024-05-04 MED ADMIN — potassium chloride 20 mEq in 100 mL IVPB Premix: 20 meq | INTRAVENOUS | @ 22:00:00 | Stop: 2024-05-04

## 2024-05-04 MED ADMIN — potassium chloride 20 mEq in 100 mL IVPB Premix: 20 meq | INTRAVENOUS | @ 21:00:00 | Stop: 2024-05-04

## 2024-05-04 NOTE — Progress Notes (Signed)
 Hospital Medicine Daily Progress Note    Assessment/Plan:  [name] is a 78 y.o. female with Pmhx notable for HTN, MetALD cirrhosis, severe obseity, and persistent a fib who presented to [facility] initially for encephalopathy following an aborted DCCV (LAA thrombus found). She subsequently had a complex hospital stay with multiple ICU admissions, PEA arrest, multiple intubations and eventually tracheostomy. Currently weaning trach, and providing NGT feeds, but mental status continues to be poor    Acute hypoxemic and hypercarbic respiratory failure  Trach/vent depedent  D/w RT and weaning team. Plan to decrease pressure support very slowly, as she has become very hypercarbic in the past with a faster wean. Only on 25% FiO2, encephalopathy, poor mechanics, and volume overload might all be contributing   - Routine trach care per RT  - Appreciate assistance with vent weaning  - VBG QAM since we are adjusting vent settings.     Hypotension:  Recurrent issue, had septic shock earlier in hospitalization. Seems to be sensitive to diuresis  - Improved with albumin  12/2    Encephalopathy, multifactorial:  Focal non-convulsive seizures  Multiple drivers, prolonged ICU stays, cardiac arrest, metabolic derangements have been corrected, likely some hepatic encephalopathy. Did have subclinical seizures earlier in her course as well. Cefepime  also possible contributing. Avoiding other centrally acting medications   - Continue Lactulose , only hold if >1071ml stool via FMS  - Ongoing re-evalaution especially with family at bedside as able  - Per prior neuro recommendations, repeat MRI brain ~12/4; will do this now to try to assist in prognostication  - Continue Levetiracetam  750mg  BID and Lacosamide  100mg  BID    Anasarca:  2/2 prolonged critical illness, underlying cirrhosis, and hypoalbuminemia  - Lasix  40mg  spot dosing with close monitoring of BP, and BMP BID   - K replaced twice today    VAP 2/2 PsA and MSSA  Plan to complete total of 7 days through 12/3  - Continue Pip/tazo for now    Persistent A fib  LAA thrombus:  Rate controlled  - Metop 25mg  BID  - Apixaban  5mg  BID     Decompensated MetALD cirrhosis   - Volume: anasarca as above, multiple POCUS evals without ascites  - Infection: no e/o SBP  - Bleeding: had MWT earlier in course, continue PPI BID, no e/o varices  - Encephalopathy: Rifaxamin and Lactulose  to goal of stool output via FMS    Severe protein calorie malnutrition  Hypernatremia, resolved  - Continue feeds via NGT, depending on GOC, and LTAC abilities, may need to pursue PEG  - FWF q2h     Chronic diastolic heart failure (HFimpEF)  Not an active issue; edema unlikely cardiogenic in nature  -- strict intake / output     Right internal jugular / carotid fistula, resolved  Apparently thrombosed; no flow on carotid doppler 03/26/2024  -- discuss with vascular surgery whether any follow-up needed before discharge    GOC:  Multiple prior ACP discussions. Appreciate palliative care input. Goal is for functional recovery, unfortunately mental status appears to be very waxing/waning and has been worse the last several days  - Plan for family meeting 12/4 now via phone at 2pm    Advanced care planning  - Code status: Full  - Healthcare proxy: daughter, [name]  - Disposition: LTACH - CM looking into if they can take her with an NGT  PT/OT recommendations: 5xlow     FEN/GI/PPX  - Diet: Nutren 2.0 continuous TF   - IVF: None  -  Bowel regimen: lactulose   - DVT ppx: On therapeutic AC    I personally spent 50 minutes face-to-face and non-face-to-face in the care of this patient, which includes all pre, intra, and post visit time on the date of service.  All documented time was specific to the E/M visit and does not include any procedures that may have been performed.    Therisa Deanne Franco, MD  Assistant Professor of Medicine    ___________________________________________________________________    Subjective:  Continues to be minimally to unresponsive. D/w RN - pt has not been interactive with her, though does spontaneously move her left arm. Swelling still present.     D/w her daughter, [name] over the phone. She visited late last night. She felt like her mom perked up when she arrived, opened her eyes, and attempted to communicate via blinking. She still feels this is worse than Sunday when she seemed more awake and like she was watching TV with them. She wanted to be sure that she had gotten lactulose  as holding that hs caused issues in the past. She is unfortunately unable to come to the hospital before 4 today as her dad is having a visit from a hospice nurse.    Labs/Studies:  Labs and Studies from the last 24hrs per EMR and Reviewed    Objective:  Temp:  [37.1 ??C (98.8 ??F)-37.5 ??C (99.5 ??F)] 37.4 ??C (99.4 ??F)  Pulse:  [89-114] 102  SpO2 Pulse:  [88-102] 102  Resp:  [14-40] 23  BP: (112-135)/(47-71) 135/71  FiO2 (%):  [25 %] 25 %  SpO2:  [97 %-99 %] 97 %    Gen: lying in bed, does not respond except to grimace to pain  HEENT: Dry MM, NGT in place with feeds running, trach in place, on PS on vent, miniimal secretions, suctioned by RN  CV: irregular, S1/S2, no MRG  Pulm: CTAB, no WRR  Abd: soft, non-tender, normal BS  Ext: diffuse anasarca, legs are weeping, R>L ansarca, LUE PICC in place   Neuro: unresponsive except to grimace to painful stimuli spontaneously moves L arm

## 2024-05-04 NOTE — Plan of Care (Signed)
 Pt revived on pressure support, trache care completed during shift. Will continue to monitor.  Problem: Mechanical Ventilation Invasive  Goal: Effective Communication  Outcome: Ongoing - Unchanged  Goal: Optimal Device Function  Outcome: Ongoing - Unchanged  Goal: Mechanical Ventilation Liberation  Outcome: Ongoing - Unchanged

## 2024-05-04 NOTE — Plan of Care (Signed)
 Shift Summary  Potassium chloride  was administered three times to address low potassium levels, with a slight improvement noted in the afternoon lab results.   Furosemide  was given late morning, with subsequent labs showing stable renal function and low creatinine.   Perineal care and CHG wipes were provided in the afternoon to support skin hygiene and infection prevention.   The patient remained completely dependent for all activities and mobility, with no change in cognitive or neurological status.   Overall, the patient required full support for all care needs and continued to receive interventions for skin protection, infection prevention, and respiratory management.     Skin Health and Integrity: Skin remained weeping throughout the shift, but frequent repositioning, use of pressure reduction devices, and incontinence pads were maintained to protect skin and minimize further breakdown. Perineal care and CHG wipes were provided in the afternoon to support hygiene and reduce infection risk.     Absence of Hospital-Acquired Illness or Injury: Aseptic technique, infection prevention measures, and environmental safety protocols were consistently maintained, with no new hospital-acquired injuries documented. Fall reduction and aspiration precautions were in place, and the environment remained safe.     Improved Ability to Complete Activities of Daily Living: Remained completely dependent for all ADLs, including bathing and oral care, with staff providing full assistance throughout the shift. No improvement in mobility or independence was observed.     Effective Breathing Pattern: Respiratory rate fluctuated but airway patency and pulmonary hygiene were maintained, with regular suctioning and ventilator support provided; breathing pattern remained regular and unlabored during assessments.     Optimal Cognitive Function: Remained nonverbal and sedated with persistent positive CAM-ICU features and low Glasgow Coma Scale scores, with no change in orientation or cognitive status during the shift.

## 2024-05-04 NOTE — Plan of Care (Signed)
 No significant changes today.  PT remains on ventilator on PS 16/5 28%.  All trach care was done today.  All emergency airway equipment is bedside.        Problem: Mechanical Ventilation Invasive  Goal: Effective Communication  Outcome: Ongoing - Unchanged  Goal: Optimal Device Function  Outcome: Ongoing - Unchanged  Intervention: Optimize Device Care and Function  Recent Flowsheet Documentation  Taken 05/04/2024 1440 by Steffan Marney MATSU, RRT  Airway/Ventilation Management:   airway patency maintained   calming measures promoted   humidification applied   pulmonary hygiene promoted  Taken 05/04/2024 9081 by Steffan Marney MATSU, RRT  Airway/Ventilation Management:   airway patency maintained   calming measures promoted   humidification applied   pulmonary hygiene promoted  Oral Care:   mouth swabbed   oral rinse provided   suction provided   teeth brushed   tongue brushed  Goal: Mechanical Ventilation Liberation  Outcome: Ongoing - Unchanged  Goal: Optimal Nutrition Delivery  Outcome: Ongoing - Unchanged  Goal: Absence of Device-Related Skin and Tissue Injury  Outcome: Ongoing - Unchanged  Goal: Absence of Ventilator-Induced Lung Injury  Outcome: Ongoing - Unchanged  Intervention: Prevent Ventilator-Associated Pneumonia  Recent Flowsheet Documentation  Taken 05/04/2024 1440 by Steffan Marney MATSU, RRT  Head of Bed [facility]) Positioning: HOB at 30-45 degrees  VAP Prevention Bundle:   HOB elevation maintained   oral care regularly provided  Taken 05/04/2024 0918 by Steffan Marney MATSU, RRT  Head of Bed [facility]) Positioning: HOB at 30-45 degrees  VAP Prevention Bundle:   HOB elevation maintained   oral care regularly provided  Oral Care:   mouth swabbed   oral rinse provided   suction provided   teeth brushed   tongue brushed     Problem: Artificial Airway  Goal: Effective Communication  Outcome: Ongoing - Unchanged  Goal: Optimal Device Function  Outcome: Ongoing - Unchanged  Intervention: Optimize Device Care and Function  Recent Flowsheet Documentation  Taken 05/04/2024 1440 by Steffan Marney MATSU, RRT  Airway/Ventilation Management:   airway patency maintained   calming measures promoted   humidification applied   pulmonary hygiene promoted  Taken 05/04/2024 9081 by Steffan Marney MATSU, RRT  Airway/Ventilation Management:   airway patency maintained   calming measures promoted   humidification applied   pulmonary hygiene promoted  Oral Care:   mouth swabbed   oral rinse provided   suction provided   teeth brushed   tongue brushed  Goal: Absence of Device-Related Skin or Tissue Injury  Outcome: Ongoing - Unchanged     Problem: Mechanical Ventilation Invasive  Goal: Effective Communication  Outcome: Ongoing - Unchanged  Goal: Optimal Device Function  Outcome: Ongoing - Unchanged  Intervention: Optimize Device Care and Function  Recent Flowsheet Documentation  Taken 05/04/2024 1440 by Steffan Marney MATSU, RRT  Airway/Ventilation Management:   airway patency maintained   calming measures promoted   humidification applied   pulmonary hygiene promoted  Taken 05/04/2024 9081 by Karlie Aung G, RRT  Airway/Ventilation Management:   airway patency maintained   calming measures promoted   humidification applied   pulmonary hygiene promoted  Oral Care:   mouth swabbed   oral rinse provided   suction provided   teeth brushed   tongue brushed  Goal: Mechanical Ventilation Liberation  Outcome: Ongoing - Unchanged  Goal: Optimal Nutrition Delivery  Outcome: Ongoing - Unchanged  Goal: Absence of Device-Related Skin and Tissue Injury  Outcome: Ongoing - Unchanged  Goal: Absence of  Ventilator-Induced Lung Injury  Outcome: Ongoing - Unchanged  Intervention: Prevent Ventilator-Associated Pneumonia  Recent Flowsheet Documentation  Taken 05/04/2024 1440 by Steffan Marney MATSU, RRT  Head of Bed [facility]) Positioning: HOB at 30-45 degrees  VAP Prevention Bundle:   HOB elevation maintained   oral care regularly provided  Taken 05/04/2024 0918 by Steffan Marney MATSU, RRT  Head of Bed [facility]) Positioning: HOB at 30-45 degrees  VAP Prevention Bundle:   HOB elevation maintained   oral care regularly provided  Oral Care:   mouth swabbed   oral rinse provided   suction provided   teeth brushed   tongue brushed

## 2024-05-04 NOTE — Progress Notes (Signed)
 ICU TRANSPORT NOTE    Destination: MRI    Departing Unit: MPCU  Pickup Time: 2150    Return Unit: MPCU  Return Time: 2320    [facility] patient ID band verified  Allergies Reviewed  Code Status at time of transport: Full    Report received from primary nurse via [employer]. Handoff performed . Patient transported via stretcher under stepdown level of care. See vital signs during transport via [employer]. Shiley trache to O2 via Ventilator @ 28 %, managed by RT. Patient is not  following command , not on sedation. Patient tolerated procedure/scan well. Universal precautions maintained throughout transport.    Update and care given to primary nurse. See Doc Flowsheets/MAR for additional transportation documentation. Proper body mechanics and safe patient handling equipment were utilized throughout transport.

## 2024-05-04 NOTE — Plan of Care (Addendum)
 Pt is unable to follow commands, UTA mentation. Corpak intact running continuous tube feeds. PICC intact. FMS intact with lots of output. Standard precautions maintained, no falls/injuries this shift. All monitors with appropriate alarm settings, see flowsheets/MAR for further info.        Shift Summary  Respiratory rate spiked late evening but returned to baseline by early morning, with ventilator support adjusted as needed.   Skin remained weeping with scattered bruising, and frequent repositioning and pressure reduction interventions were maintained.   No pain or distress behaviors were observed on repeated NAPS assessments.   MAP and pulse remained stable within a moderate range, and infection prevention protocols were followed.   Patient remained non-interactive and unable to express needs, with no change in readiness for transition of care.     Optimal Comfort and Wellbeing: Comfort measures such as frequent repositioning, pressure reduction techniques, and skin protection were consistently implemented throughout the shift; skin remained weeping with scattered bruising, and mood/interaction did not change. No distress behaviors or pain were observed on NAPS assessments, and infection prevention protocols were maintained.     Readiness for Transition of Care: Unplanned readmission score and candidacy scores remained unchanged, and patient was not interactive or able to express feelings during the shift.     Effective Breathing Pattern: Respiratory rate fluctuated but returned to baseline by end of shift, with regular and unlabored respiratory pattern documented throughout; pressure support was increased overnight, and minute volume trended down.     Blood Pressure in Desired Range: MAP values fluctuated but remained within a moderate range, with weak radial pulse noted throughout the shift.     Optimal Pain Control and Function: NAPS scores remained at 0 with relaxed facial expression, normal tone, and no abnormal vocalizations, indicating no observed pain or discomfort.

## 2024-05-05 LAB — BLOOD GAS, VENOUS
BASE EXCESS VENOUS: 3.5 — ABNORMAL HIGH (ref -2.0–2.0)
CARBOXYHEMOGLOBIN, VENOUS: 1.9 % — ABNORMAL HIGH (ref <1.2)
HCO3 VENOUS: 27 mmol/L (ref 22–27)
METHEMOGLOBIN, VENOUS: <1 % (ref <1.5)
O2 SATURATION VENOUS: 74.5 % (ref 40.0–85.0)
OXYHEMOGLOBIN, VENOUS: 73.1 % (ref 40.0–85.0)
PCO2 VENOUS: 40 mmHg (ref 40–60)
PH VENOUS: 7.45 — ABNORMAL HIGH (ref 7.32–7.43)
PO2 VENOUS: 43 mmHg (ref 30–55)

## 2024-05-05 LAB — PHOSPHORUS: PHOSPHORUS: 2.2 mg/dL — ABNORMAL LOW (ref 2.4–5.1)

## 2024-05-05 LAB — BASIC METABOLIC PANEL
ANION GAP: 7 mmol/L (ref 5–14)
BLOOD UREA NITROGEN: 17 mg/dL (ref 9–23)
BUN / CREAT RATIO: 41
CALCIUM: 8.3 mg/dL — ABNORMAL LOW (ref 8.7–10.4)
CHLORIDE: 104 mmol/L (ref 98–107)
CO2: 30 mmol/L (ref 20.0–31.0)
CREATININE: 0.41 mg/dL — ABNORMAL LOW (ref 0.55–1.02)
EGFR CKD-EPI (2021) FEMALE: >90 mL/min/1.73m2 (ref >=60)
GLUCOSE RANDOM: 133 mg/dL (ref 70–179)
POTASSIUM: 3.5 mmol/L (ref 3.4–4.8)
SODIUM: 141 mmol/L (ref 135–145)

## 2024-05-05 MED ADMIN — sodium chloride (NS) 0.9 % flush 10 mL: 10 mL | INTRAVENOUS | @ 15:00:00

## 2024-05-05 MED ADMIN — sodium chloride (NS) 0.9 % flush 10 mL: 10 mL | INTRAVENOUS | @ 08:00:00

## 2024-05-05 MED ADMIN — sodium chloride (NS) 0.9 % flush 10 mL: 10 mL | INTRAVENOUS | @ 23:00:00

## 2024-05-05 MED ADMIN — rifAXIMin (XIFAXAN) oral suspension: 550 mg | GASTROENTERAL | @ 01:00:00 | Stop: 2025-04-11

## 2024-05-05 MED ADMIN — rifAXIMin (XIFAXAN) oral suspension: 550 mg | GASTROENTERAL | @ 13:00:00 | Stop: 2025-04-11

## 2024-05-05 MED ADMIN — lactulose oral solution: 30 g | GASTROENTERAL | @ 13:00:00

## 2024-05-05 MED ADMIN — folic acid (FOLVITE) tablet 1 mg: 1 mg | GASTROENTERAL | @ 13:00:00

## 2024-05-05 MED ADMIN — metoPROLOL tartrate (Lopressor) tablet 25 mg: 25 mg | GASTROENTERAL | @ 02:00:00

## 2024-05-05 MED ADMIN — thiamine mononitrate (vit B1) tablet 100 mg: 100 mg | GASTROENTERAL | @ 13:00:00

## 2024-05-05 MED ADMIN — ergocalciferol (DRISDOL) oral drops 200 mcg/mL (8,000 unit/mL): 100 ug | GASTROENTERAL | @ 13:00:00 | Stop: 2024-05-21

## 2024-05-05 MED ADMIN — cyanocobalamin (vitamin B-12) tablet 1,000 mcg: 1000 ug | GASTROENTERAL | @ 13:00:00

## 2024-05-05 MED ADMIN — levETIRAcetam (KEPPRA) tablet 750 mg: 750 mg | GASTROENTERAL | @ 13:00:00

## 2024-05-05 MED ADMIN — levETIRAcetam (KEPPRA) tablet 750 mg: 750 mg | GASTROENTERAL | @ 01:00:00

## 2024-05-05 MED ADMIN — famotidine (PEPCID) tablet 20 mg: 20 mg | GASTROENTERAL | @ 13:00:00

## 2024-05-05 MED ADMIN — melatonin tablet 3 mg: 3 mg | GASTROENTERAL | @ 23:00:00

## 2024-05-05 MED ADMIN — apixaban (ELIQUIS) tablet 5 mg: 5 mg | GASTROENTERAL | @ 01:00:00

## 2024-05-05 MED ADMIN — apixaban (ELIQUIS) tablet 5 mg: 5 mg | GASTROENTERAL | @ 13:00:00

## 2024-05-05 MED ADMIN — zinc acetate oral solution: 50 mg | GASTROENTERAL | @ 23:00:00 | Stop: 2024-05-05

## 2024-05-05 MED ADMIN — esomeprazole (NEXIUM) granules 40 mg: 40 mg | GASTROENTERAL | @ 13:00:00

## 2024-05-05 MED ADMIN — multivitamins, therapeutic with minerals tablet 1 tablet: 1 | GASTROENTERAL | @ 13:00:00

## 2024-05-05 MED ADMIN — lacosamide (VIMPAT) tablet 100 mg: 100 mg | GASTROENTERAL | @ 01:00:00

## 2024-05-05 MED ADMIN — lacosamide (VIMPAT) tablet 100 mg: 100 mg | GASTROENTERAL | @ 13:00:00

## 2024-05-05 MED ADMIN — acetaminophen (TYLENOL) tablet 650 mg: 650 mg | ORAL | @ 01:00:00

## 2024-05-05 MED ADMIN — acetaminophen (TYLENOL) tablet 650 mg: 650 mg | ORAL | @ 09:00:00

## 2024-05-05 MED ADMIN — hydrOXYzine (ATARAX) tablet 25 mg: 25 mg | ORAL | @ 09:00:00 | Stop: 2024-05-05

## 2024-05-05 MED ADMIN — gadopiclenol (ELUCIREM,VUEWAY) injection 10 mL: 10 mL | INTRAVENOUS | @ 04:00:00 | Stop: 2024-05-04

## 2024-05-05 MED ADMIN — potassium phosphate (monobasic) (K-PHOS) tablet 1,000 mg: 1000 mg | ORAL | @ 13:00:00 | Stop: 2024-05-05

## 2024-05-05 MED ADMIN — lactated ringers bolus 1,000 mL: 1000 mL | INTRAVENOUS | @ 05:00:00

## 2024-05-05 NOTE — Consults (Signed)
 Palliative Care Progress Note        Consultation from Requesting Attending Physician:  Therisa Deanne Franco, MD  Primary Care Provider:  Alyse Slater Pao, MD      Assessment/Plan:      SUMMARY:  This 78 y.o. 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      Symptom Assessment and Recommendations:      #Dyspnea 2/2 Acute hypoxemic and hypercarbic respiratory failure: Ongoing, currently trach/vent depedent  - Vent wean per ROAD team    #Anasarca: ONgoing, particularly in LEs. 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 Multi-factorial in setting of cirrhosis, infection, respiratory failure and baseline microvascular changes in brain.   - Continue lactulose  - very important to family that she continue to have sufficient BMs to limit - currently meeting that threshold with >1L stool output per day  - Cont delirium precautions    Goals of care / ACP:  Code Status:   Code Status: Full Code   Healthcare decision-maker if lacks capacity:   HCDM (patient stated preference): Ruest,[name]    HCDM (patient stated preference): Eckenrode,[name] - Daughter - [phone]     12/4:     Advance Care Planning     Participants: [name] Kerns, Therisa Franco, MD, Alyce Gola, MD  Discussion/Action:   - Telephone conference with [name], primary team, and palliative.  MRI results shared with [name], noting near resolution of prior diffuse cortical restricted diffusion. Despite this, patinet has not shown any improvement in her neurological exam. We further shared that MRI also showed evidence of chronic microvascular ischemic disease, global parenchymal volume loss, and remote ischemic infarct - all suggestive of more fragile brain at baseline. Primary team also shared update that patinet has been having apneic spells while on the ventilator and has therefore vent settings have been adjsuted to provide more support. [name] shared that when she was with her mom on Sunday (11/30) it seemed that she was more alert and able to blink on command, but then on Tuesday (12/2) was not able to do so.   GLEN[place]OD [name] received the above news and understands that medical teams remain worried that we haven't seen improvement in her neuro exam or her respiratory status. [name] would like to see her mom in person again (likely tomorrow) before making any further decisions. We affirmed that and noted there is not urgency to make a quick decision. The next decisions we would face would be whether to place a PEG and then whether to refer to LTAC. [name] affirmed prior wishes that they want to allow patient to have time to recover if it is possible, but if it becomes more clear that functional recovery is not possible or very unlikely, then they may take a different approach.   [place]EN[place]OD [name] also shared that her father enrolled in home hospice yesterday, which has certainly added to the stress of what is going on with her mom's health.   - Plan to regroup early next week after  [name] has had a chance to visit with her mother    Health Care Decision Maker as of 05/05/2024    HCDM (patient stated preference): Patchin,[name]    HCDM (patient stated preference): Brusca,[name] - Daughter - [phone]    I spent 20 minutes in voluntary ACP services           Goals of care as discussed on 11/18:  - [name] shares this has been a tough week as her mom has gotten sicker, and her dad has made it out of rehab and is living with her, requiring a lot of care and supervision.  She is continuing to work as well and care for her children.  - She has heard that her mom's mental status has worsened and she has not been waking up.  She has heard there is concern about her ammonia levels, and potential seizures.  - She is worried, and at the same time holding out hope still that her mom could improve from this  - She is amenable to a family meeting with PC and ICU teams, although unsure of timing due to her work schedule and other responsibilities    Goals of care as discussed on 11/12:  - Please see ACP note from same day for further details.  - In brief, presented family with options to proceed with tracheostomy versus extubation without a plan for reintubation if she were to fail (plan at that time would be to transition to comfort focused care and allow natural death)  - Family affirmed Ernesteen's ultimate goals would be to eventually get back home and lead a functional life.  They believe she would be willing to go through a prolonged hospitalization, tracheostomy and rehab if there is some hope of reaching that goal.  ICU team shared that they thought while the road ahead would be long and difficult, but this was definitely possible and that recent indicators have showed that Landrie is slowly improving.  - Patient and family ultimately decided to proceed with tracheostomy    Goals of care as discussed on 11/10:  - Guiliana's family shares this has been a long and difficult hospitalization, with lots of unexpected decompensations  - They share that before this hospitalization, Gearldene was doing well at home.  She was independent.  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  This is a MAJOR change for her.  - The ICU team shares that in the past, there have been questions from the family about if they would offer extubation and reintubation if indicated for a 5th time (as an alternative to proceeding straight to tracheostomy).  They would likely not offer this due to substantial risks without much benefit.    Practical, Emotional, Spiritual Support Recommendations:  Patient's daughters would like the patient to be included in decision making as much as possible    Recommendations discussed with primary team via Epic chat    Thank you for this consult. Please page Palliative Care if there are any questions.  Palliative Care team will continue to follow.    Subjective:     Recent Events:  Having apenic episodes so placed on rate. Patient briefly opened eyes to voice today, but not able to follow commands. Exam notable for significant peripheral edema in upper and lower extremities.       Objective:       Function:  30% - Ambulation: Totally Bed Bound / Unable to do any work,  extensive disease / Self-Care: Total care / Intake: Reduced / Level of Conscious: Full, drowsy, or confusion    Temp:  [36.1 ??C (97 ??F)-37.4 ??C (99.4 ??F)] 36.1 ??C (97 ??F)  Pulse:  [78-110] 95  SpO2 Pulse:  [78-109] 104  Resp:  [9-40] 14  BP: (82-145)/(37-96) 109/84  FiO2 (%):  [25 %-28 %] 25 %  SpO2:  [96 %-100 %] 100 %    Physical Exam:  General:  Ill appearing woman in NAD, non responsive to verbal/light tactile stimulation   HEENT:  s/p trach, connected to vent  Cardiovascular:  No evidence of cyanosis, RRR  Pulmonary:  Rhonchorous breath sounds on ventilator  Gastrointestinal:  Distended  Ext:  Worsening pitting edema bilateral LE, also present in UE    Testing reviewed and interpreted:  Reviewed and interpreted test results for cbc with anemia; bmp with nl renal function, low albumin  affecting assessment of underlying illness severity and prognosis    I personally spent 35 minutes face-to-face and non-face-to-face in the care of this patient, which includes all pre, intra, and post visit time on the date of service.  All documented time was specific to the E/M visit and does not include any procedures that may have been performed.     See ACP Note from today for additional billable service: Yes, see above.    Alyce Gola, MD  Hospice/Palliative Care

## 2024-05-05 NOTE — Plan of Care (Signed)
 Problem: Skin Injury Risk Increased  Goal: Skin Health and Integrity  Intervention: Optimize Skin Protection  Recent Flowsheet Documentation  Taken 05/05/2024 0158 by Pearson Bolk, RRT  Head of Bed [facility]) Positioning: HOB at 30-45 degrees  Taken 05/04/2024 2321 by Pearson Bolk, RRT  Head of Bed [facility]) Positioning: (patient being cleaned) HOB flat  Taken 05/04/2024 2111 by Pearson Bolk, RRT  Head of Bed [facility]) Positioning: HOB at 60 degrees     Problem: Breathing Pattern Ineffective  Goal: Effective Breathing Pattern  Intervention: Promote Improved Breathing Pattern  Recent Flowsheet Documentation  Taken 05/05/2024 0158 by Pearson Bolk, RRT  Head of Bed [facility]) Positioning: HOB at 30-45 degrees  Airway/Ventilation Management:   airway patency maintained   humidification applied   pulmonary hygiene promoted  Taken 05/04/2024 2321 by Pearson Bolk, RRT  Head of Bed [facility]) Positioning: (patient being cleaned) HOB flat  Airway/Ventilation Management:   airway patency maintained   humidification applied   pulmonary hygiene promoted  Taken 05/04/2024 2111 by Pearson Bolk, RRT  Head of Bed [facility]) Positioning: HOB at 60 degrees  Airway/Ventilation Management:   airway patency maintained   humidification applied   pulmonary hygiene promoted     Problem: Gas Exchange Impaired  Goal: Optimal Gas Exchange  Intervention: Optimize Oxygenation and Ventilation  Recent Flowsheet Documentation  Taken 05/05/2024 0158 by Pearson Bolk, RRT  Head of Bed [facility]) Positioning: HOB at 30-45 degrees  Airway/Ventilation Management:   airway patency maintained   humidification applied   pulmonary hygiene promoted  Taken 05/04/2024 2321 by Pearson Bolk, RRT  Head of Bed [facility]) Positioning: (patient being cleaned) HOB flat  Airway/Ventilation Management:   airway patency maintained   humidification applied   pulmonary hygiene promoted  Taken 05/04/2024 2111 by Pearson Bolk, RRT  Head of Bed [facility]) Positioning: HOB at 60 degrees  Airway/Ventilation Management:   airway patency maintained   humidification applied   pulmonary hygiene promoted     Problem: Wound  Goal: Skin Health and Integrity  Intervention: Optimize Skin Protection  Recent Flowsheet Documentation  Taken 05/05/2024 0158 by Pearson Bolk, RRT  Head of Bed [facility]) Positioning: HOB at 30-45 degrees  Taken 05/04/2024 2321 by Pearson Bolk, RRT  Head of Bed [facility]) Positioning: (patient being cleaned) HOB flat  Taken 05/04/2024 2111 by Pearson Bolk, RRT  Head of Bed [facility]) Positioning: HOB at 60 degrees     Problem: Mechanical Ventilation Invasive  Goal: Optimal Device Function  Intervention: Optimize Device Care and Function  Recent Flowsheet Documentation  Taken 05/05/2024 0158 by Pearson Bolk, RRT  Airway/Ventilation Management:   airway patency maintained   humidification applied   pulmonary hygiene promoted  Taken 05/04/2024 2321 by Pearson Bolk, RRT  Airway/Ventilation Management:   airway patency maintained   humidification applied   pulmonary hygiene promoted  Taken 05/04/2024 2111 by Pearson Bolk, RRT  Airway/Ventilation Management:   airway patency maintained   humidification applied   pulmonary hygiene promoted  Goal: Absence of Ventilator-Induced Lung Injury  Intervention: Prevent Ventilator-Associated Pneumonia  Recent Flowsheet Documentation  Taken 05/05/2024 0158 by Pearson Bolk, RRT  Head of Bed [facility]) Positioning: HOB at 30-45 degrees  VAP Prevention Bundle:   HOB elevation maintained   oral care regularly provided   vent circuit breaks minimized  Taken 05/04/2024 2321 by Pearson Bolk, RRT  Head of Bed [facility]) Positioning: (patient being cleaned) HOB flat  VAP Prevention Bundle:   HOB elevation maintained   readiness to extubate assessed   vent  circuit breaks minimized  Taken 05/04/2024 2111 by Pearson Bolk, RRT  Head of Bed [facility]) Positioning: HOB at 60 degrees  VAP Prevention Bundle:   HOB elevation maintained   oral care regularly provided   vent circuit breaks minimized     Problem: Mechanical Ventilation Invasive  Goal: Optimal Device Function  Intervention: Optimize Device Care and Function  Recent Flowsheet Documentation  Taken 05/05/2024 0158 by Pearson Bolk, RRT  Airway/Ventilation Management:   airway patency maintained   humidification applied   pulmonary hygiene promoted  Taken 05/04/2024 2321 by Pearson Bolk, RRT  Airway/Ventilation Management:   airway patency maintained   humidification applied   pulmonary hygiene promoted  Taken 05/04/2024 2111 by Pearson Bolk, RRT  Airway/Ventilation Management:   airway patency maintained   humidification applied   pulmonary hygiene promoted  Goal: Absence of Ventilator-Induced Lung Injury  Intervention: Prevent Ventilator-Associated Pneumonia  Recent Flowsheet Documentation  Taken 05/05/2024 0158 by Pearson Bolk, RRT  Head of Bed [facility]) Positioning: HOB at 30-45 degrees  VAP Prevention Bundle:   HOB elevation maintained   oral care regularly provided   vent circuit breaks minimized  Taken 05/04/2024 2321 by Pearson Bolk, RRT  Head of Bed [facility]) Positioning: (patient being cleaned) HOB flat  VAP Prevention Bundle:   HOB elevation maintained   readiness to extubate assessed   vent circuit breaks minimized  Taken 05/04/2024 2111 by Pearson Bolk, RRT  Head of Bed [facility]) Positioning: HOB at 60 degrees  VAP Prevention Bundle:   HOB elevation maintained   oral care regularly provided   vent circuit breaks minimized     Problem: Mechanical Ventilation Invasive  Goal: Optimal Device Function  Intervention: Optimize Device Care and Function  Recent Flowsheet Documentation  Taken 05/05/2024 0158 by Pearson Bolk, RRT  Airway/Ventilation Management:   airway patency maintained   humidification applied   pulmonary hygiene promoted  Taken 05/04/2024 2321 by Pearson Bolk, RRT  Airway/Ventilation Management:   airway patency maintained   humidification applied   pulmonary hygiene promoted  Taken 05/04/2024 2111 by Pearson Bolk, RRT  Airway/Ventilation Management:   airway patency maintained   humidification applied   pulmonary hygiene promoted  Goal: Absence of Ventilator-Induced Lung Injury  Intervention: Prevent Ventilator-Associated Pneumonia  Recent Flowsheet Documentation  Taken 05/05/2024 0158 by Pearson Bolk, RRT  Head of Bed [facility]) Positioning: HOB at 30-45 degrees  VAP Prevention Bundle:   HOB elevation maintained   oral care regularly provided   vent circuit breaks minimized  Taken 05/04/2024 2321 by Pearson Bolk, RRT  Head of Bed [facility]) Positioning: (patient being cleaned) HOB flat  VAP Prevention Bundle:   HOB elevation maintained   readiness to extubate assessed   vent circuit breaks minimized  Taken 05/04/2024 2111 by Pearson Bolk, RRT  Head of Bed [facility]) Positioning: HOB at 60 degrees  VAP Prevention Bundle:   HOB elevation maintained   oral care regularly provided   vent circuit breaks minimized     Problem: Mechanical Ventilation Invasive  Goal: Optimal Device Function  Intervention: Optimize Device Care and Function  Recent Flowsheet Documentation  Taken 05/05/2024 0158 by Pearson Bolk, RRT  Airway/Ventilation Management:   airway patency maintained   humidification applied   pulmonary hygiene promoted  Taken 05/04/2024 2321 by Pearson Bolk, RRT  Airway/Ventilation Management:   airway patency maintained   humidification applied   pulmonary hygiene promoted  Taken 05/04/2024 2111 by Pearson Bolk, RRT  Airway/Ventilation Management:   airway patency maintained  humidification applied   pulmonary hygiene promoted  Goal: Absence of Ventilator-Induced Lung Injury  Intervention: Prevent Ventilator-Associated Pneumonia  Recent Flowsheet Documentation  Taken 05/05/2024 0158 by Pearson Bolk, RRT  Head of Bed [facility]) Positioning: HOB at 30-45 degrees  VAP Prevention Bundle:   HOB elevation maintained   oral care regularly provided   vent circuit breaks minimized  Taken 05/04/2024 2321 by Pearson Bolk, RRT  Head of Bed [facility]) Positioning: (patient being cleaned) HOB flat  VAP Prevention Bundle:   HOB elevation maintained   readiness to extubate assessed   vent circuit breaks minimized  Taken 05/04/2024 2111 by Pearson Bolk, RRT  Head of Bed [facility]) Positioning: HOB at 60 degrees  VAP Prevention Bundle:   HOB elevation maintained   oral care regularly provided   vent circuit breaks minimized     Problem: Mechanical Ventilation Invasive  Goal: Optimal Device Function  Intervention: Optimize Device Care and Function  Recent Flowsheet Documentation  Taken 05/05/2024 0158 by Pearson Bolk, RRT  Airway/Ventilation Management:   airway patency maintained   humidification applied   pulmonary hygiene promoted  Taken 05/04/2024 2321 by Pearson Bolk, RRT  Airway/Ventilation Management:   airway patency maintained   humidification applied   pulmonary hygiene promoted  Taken 05/04/2024 2111 by Pearson Bolk, RRT  Airway/Ventilation Management:   airway patency maintained   humidification applied   pulmonary hygiene promoted  Goal: Absence of Ventilator-Induced Lung Injury  Intervention: Prevent Ventilator-Associated Pneumonia  Recent Flowsheet Documentation  Taken 05/05/2024 0158 by Pearson Bolk, RRT  Head of Bed [facility]) Positioning: HOB at 30-45 degrees  VAP Prevention Bundle:   HOB elevation maintained   oral care regularly provided   vent circuit breaks minimized  Taken 05/04/2024 2321 by Pearson Bolk, RRT  Head of Bed [facility]) Positioning: (patient being cleaned) HOB flat  VAP Prevention Bundle:   HOB elevation maintained   readiness to extubate assessed   vent circuit breaks minimized  Taken 05/04/2024 2111 by Pearson Bolk, RRT  Head of Bed [facility]) Positioning: HOB at 60 degrees  VAP Prevention Bundle:   HOB elevation maintained   oral care regularly provided   vent circuit breaks minimized     Problem: Mechanical Ventilation Invasive  Goal: Optimal Device Function  Intervention: Optimize Device Care and Function  Recent Flowsheet Documentation  Taken 05/05/2024 0158 by Pearson Bolk, RRT  Airway/Ventilation Management:   airway patency maintained   humidification applied   pulmonary hygiene promoted  Taken 05/04/2024 2321 by Pearson Bolk, RRT  Airway/Ventilation Management:   airway patency maintained   humidification applied   pulmonary hygiene promoted  Taken 05/04/2024 2111 by Pearson Bolk, RRT  Airway/Ventilation Management:   airway patency maintained   humidification applied   pulmonary hygiene promoted  Goal: Absence of Ventilator-Induced Lung Injury  Intervention: Prevent Ventilator-Associated Pneumonia  Recent Flowsheet Documentation  Taken 05/05/2024 0158 by Pearson Bolk, RRT  Head of [facility]) Positioning: HOB at 30-45 degrees  VAP Prevention Bundle:   HOB elevation maintained   oral care regularly provided   vent circuit breaks minimized  Taken 05/04/2024 2321 by Pearson Bolk, RRT  Head of Bed [facility]) Positioning: (patient being cleaned) HOB flat  VAP Prevention Bundle:   HOB elevation maintained   readiness to extubate assessed   vent circuit breaks minimized  Taken 05/04/2024 2111 by Pearson Bolk, RRT  Head of Bed [facility]) Positioning: HOB at 60 degrees  VAP Prevention Bundle:   HOB elevation maintained   oral care regularly  provided   vent circuit breaks minimized     Problem: Artificial Airway  Goal: Optimal Device Function  Intervention: Optimize Device Care and Function  Recent Flowsheet Documentation  Taken 05/05/2024 0158 by Pearson Bolk, RRT  Airway/Ventilation Management:   airway patency maintained   humidification applied   pulmonary hygiene promoted  Taken 05/04/2024 2321 by Pearson Bolk, RRT  Airway/Ventilation Management:   airway patency maintained   humidification applied   pulmonary hygiene promoted  Taken 05/04/2024 2111 by Pearson Bolk, RRT  Airway/Ventilation Management:   airway patency maintained   humidification applied   pulmonary hygiene promoted     Problem: Mechanical Ventilation Invasive  Goal: Optimal Device Function  Intervention: Optimize Device Care and Function  Recent Flowsheet Documentation  Taken 05/05/2024 0158 by Pearson Bolk, RRT  Airway/Ventilation Management:   airway patency maintained   humidification applied   pulmonary hygiene promoted  Taken 05/04/2024 2321 by Pearson Bolk, RRT  Airway/Ventilation Management:   airway patency maintained   humidification applied   pulmonary hygiene promoted  Taken 05/04/2024 2111 by Pearson Bolk, RRT  Airway/Ventilation Management:   airway patency maintained   humidification applied   pulmonary hygiene promoted  Goal: Absence of Ventilator-Induced Lung Injury  Intervention: Prevent Ventilator-Associated Pneumonia  Recent Flowsheet Documentation  Taken 05/05/2024 0158 by Pearson Bolk, RRT  Head of Bed [facility]) Positioning: HOB at 30-45 degrees  VAP Prevention Bundle:   HOB elevation maintained   oral care regularly provided   vent circuit breaks minimized  Taken 05/04/2024 2321 by Pearson Bolk, RRT  Head of Bed [facility]) Positioning: (patient being cleaned) HOB flat  VAP Prevention Bundle:   HOB elevation maintained   readiness to extubate assessed   vent circuit breaks minimized  Taken 05/04/2024 2111 by Pearson Bolk, RRT  Head of Bed [facility]) Positioning: HOB at 60 degrees  VAP Prevention Bundle:   HOB elevation maintained   oral care regularly provided   vent circuit breaks minimized     Problem: Mechanical Ventilation Invasive  Goal: Optimal Device Function  Intervention: Optimize Device Care and Function  Recent Flowsheet Documentation  Taken 05/05/2024 0158 by Pearson Bolk, RRT  Airway/Ventilation Management:   airway patency maintained   humidification applied   pulmonary hygiene promoted  Taken 05/04/2024 2321 by Pearson Bolk, RRT  Airway/Ventilation Management:   airway patency maintained   humidification applied   pulmonary hygiene promoted  Taken 05/04/2024 2111 by Pearson Bolk, RRT  Airway/Ventilation Management:   airway patency maintained   humidification applied   pulmonary hygiene promoted  Goal: Absence of Ventilator-Induced Lung Injury  Intervention: Prevent Ventilator-Associated Pneumonia  Recent Flowsheet Documentation  Taken 05/05/2024 0158 by Pearson Bolk, RRT  Head of Bed [facility]) Positioning: HOB at 30-45 degrees  VAP Prevention Bundle:   HOB elevation maintained   oral care regularly provided   vent circuit breaks minimized  Taken 05/04/2024 2321 by Pearson Bolk, RRT  Head of Bed [facility]) Positioning: (patient being cleaned) HOB flat  VAP Prevention Bundle:   HOB elevation maintained   readiness to extubate assessed   vent circuit breaks minimized  Taken 05/04/2024 2111 by Pearson Bolk, RRT  Head of Bed [facility]) Positioning: HOB at 60 degrees  VAP Prevention Bundle:   HOB elevation maintained   oral care regularly provided   vent circuit breaks minimized     Problem: Artificial Airway  Goal: Optimal Device Function  Intervention: Optimize Device Care and Function  Recent Flowsheet Documentation  Taken 05/05/2024 0158 by Pearson,  Hiliana Eilts, RRT  Airway/Ventilation Management:   airway patency maintained   humidification applied   pulmonary hygiene promoted  Taken 05/04/2024 2321 by Pearson Bolk, RRT  Airway/Ventilation Management:   airway patency maintained   humidification applied   pulmonary hygiene promoted  Taken 05/04/2024 2111 by Pearson Bolk, RRT  Airway/Ventilation Management:   airway patency maintained   humidification applied   pulmonary hygiene promoted   Patient remains stable on vent on PSV settings.  Jamal is patent, secure.  ETS for moderate, thick, yellow secretions. Plan to continue PSV, weaning as tolerated.

## 2024-05-05 NOTE — Progress Notes (Signed)
 Adult Nutrition Progress Note    Visit Type: Follow-Up  Reason for Visit: Enteral Nutrition    NUTRITION INTERVENTIONS and RECOMMENDATION     Continue Nutren 2.0 at a goal rate of 16mL/hr. This provides 1680 kcals, 71 g protein, 182 g carbohydrate, 78 g fat, 0 g fiber, 580 mL free water , and meets 112% USRDI.   Recommend 1 packets of Prosource No Carb daily. This provides 15 g protein and 60 kcals.   Total nutrition: 1740 kcal, 86 g protein   FWF: Currently 100 mL q2hr, adjust prn per MD team based on goal FMS output. Minimum 30 ml q4h.   Monitor electrolytes and replete as indicated.  Micronutrients:   Low:   Vit D (13.7) - Continue 50,000 units weekly x8 doses   Monitor for pending copper  lab. Patient is s/p repletion.   Discontinue zinc  12/04 - patient will have received 14 doses   Continue multivitamin, thiamine  100 mg    Weekly weights    NUTRITION ASSESSMENT     Patient appropriate for enteral nutrition support given mechanical ventilation.   Would benefit to adjust tube feeds to better meet nutritional needs (re-estimated to reflect stepdown status).  Lactulose  being titrated for goal of [phone] mL output per day - FMS in place.   FWF appropriately being adjusted with goal output changes over clinical course.  Micronutrients as above.     NUTRITIONALLY RELEVANT DATA     HPI & PMH:   78 y.o. 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 Complicated hospital course followed, with recurrent episodes of hypoxemic hypercarbic respiratory failure mostly due to persistent encephalopathy leading to hypoventilation, ultimately requiring tracheostomy. Now transferred out of MICU on 05/01/2024 for wean of ventilator support.     Nutrition Progress:   Visited patient at bedside. Patient transitioned to stepdown status and now on Hospitalist service as of 11/28. Patient remains on ventilator support. Continues tolerating tube feeds at goal. Nutren 2.0 running @35  ml/hr. Per pump check, patient has received 2547mL of tube feed formula over the last last 72 hours for an average of 837mL/day, 101% of ordered. FMS output on 12/03: . FMS output so far today.     Medications:  Nutritionally pertinent medications reviewed and evaluated for potential food and/or medication interactions.   Scheduled: vitamin B 12, ergocalciferol  100 mcg daily, Nexium , famotidine , folic acid , lactulose , multivitamin, rifaximin , thiamine  100 mg, 50 mg elemental zinc  daily  S/p copper  repletion 11/25    Labs:   Nutritionally pertinent labs reviewed.   Copper : 80 (WNL) - 04/26/24  Zinc : 36 (L)-  04/19/24  Vitamin D : 13.7 (L) - 04/18/24      Nutritional Needs:   Daily Estimated Nutrient Needs:  Energy: 1650-1925 kcals 30-35 kcal/kg using ideal body weight (given uncertain of dry weight), 55 kg (05/02/24 1513)]  Protein: 83-110 gm [1.5-2.0 gm/kg using ideal body weight, 55 kg (05/02/24 1513)]  Carbohydrate:   [45-60% of kcal]  Fluid:   mL [per MD team]    Compared to Adjusted PSU Equation for Ventillated Patients (BMI >/=30, Age >/= 60)  Tmax: 36.7 , Ve: 8.6  Energy: 1709 kcals/day (12/04)      Anthropometric Data:  Height: 162.6 cm (5' 4.02)  Admission weight: 87 kg (191 lb 12.8 oz)  Last recorded weight: (!) 111.3 kg (245 lb 6 oz) (05/01/24)  IBW: 54.53 kg  BMI: Body mass index is 42.1 kg/m??.   Usual Body Weight: Unable to obtain at this time   Weight Assessment: per below - trending down pta, though pt with cirrhosis & currently unknown dry wt. Increase over admit likely influenced by fluid - pt noted with 3+ pitting edema bilateral upper and lower extremities     Wt Readings from Last 10 Encounters:   05/01/24 (!) 111.3 kg (245 lb 6 oz) 03/07/24 89.3 kg (196 lb 12.8 oz)   03/03/24 89.1 kg (196 lb 6.4 oz)   04/23/23 96.6 kg (213 lb)   03/05/23 100.2 kg (220 lb 14.4 oz)   11/27/22 (!) 102.5 kg (225 lb 14.4 oz)   10/20/22 (!) 108 kg (238 lb)   07/17/22 (!) 108 kg (238 lb)   06/09/22 (!) 103.2 kg (227 lb 8 oz)   05/08/22 (!) 105.8 kg (233 lb 3.2 oz)     Malnutrition Assessment:  Malnutrition Assessment using AND/ASPEN or GLIM Clinical Characteristics:  Patient does not meet AND/ASPEN criteria for malnutrition at this time (03/16/24 1326)       Nutrition Focused Physical Exam:      Nutrition Evaluation  Overall Impressions: Nutrition-Focused Physical Exam not indicated due to lack of malnutrition risk factors. (03/16/24 1326)     Care plan:  Patient does not meet malnutrition criteria     Current Nutrition:  Enteral nutrition via NG tube  Nutrition Orders            Separate Enteral Additives ProSource NoCarb (Protein Modular/Sugarfree); Quantity: 1 packet daily starting at 12/02 1200    Nutren 2.0 continuous tube feed starting at 12/01 1600          Nutritionally Pertinent Allergies, Intolerances, Sensitivities, and/or Cultural/Religious Restrictions:  none identified at this time     GOALS and EVALUATION     Patient to meet 80% or greater of nutritional needs via enteral nutrition while remains on tube feeds. - Meeting/Ongoing    Motivation, Barriers, and Compliance:  Evaluation of motivation, barriers, and compliance pending at this time due to clinical status.     Discharge Planning:   Monitor for potential discharge needs with multi-disciplinary team.          Follow-Up Parameters:   1-2 times per week (and more frequent as indicated)      I appreciate the opportunity to participate in the care of this patient. Please contact me with any questions.    Devere Saucier, MS, RD, LDN  Pager: [phone]

## 2024-05-05 NOTE — Plan of Care (Signed)
 Problem: Mechanical Ventilation Invasive  Goal: Effective Communication  Outcome: Ongoing - Unchanged  Goal: Optimal Device Function  Outcome: Ongoing - Unchanged  Intervention: Optimize Device Care and Function  Recent Flowsheet Documentation  Taken 05/05/2024 1420 by Steffan Marney MATSU, RRT  Airway/Ventilation Management:   airway patency maintained   calming measures promoted   humidification applied   pulmonary hygiene promoted  Taken 05/05/2024 0806 by Steffan Marney MATSU, RRT  Airway/Ventilation Management:   airway patency maintained   calming measures promoted   humidification applied   pulmonary hygiene promoted  Oral Care:   mouth swabbed   oral rinse provided   suction provided   teeth brushed   tongue brushed  Goal: Mechanical Ventilation Liberation  Outcome: Ongoing - Unchanged  Goal: Optimal Nutrition Delivery  Outcome: Ongoing - Unchanged  Goal: Absence of Device-Related Skin and Tissue Injury  Outcome: Ongoing - Unchanged  Goal: Absence of Ventilator-Induced Lung Injury  Outcome: Ongoing - Unchanged  Intervention: Prevent Ventilator-Associated Pneumonia  Recent Flowsheet Documentation  Taken 05/05/2024 1420 by Steffan Marney MATSU, RRT  Head of Bed [facility]) Positioning: HOB at 30-45 degrees  VAP Prevention Bundle:   HOB elevation maintained   oral care regularly provided  Taken 05/05/2024 0806 by Steffan Marney MATSU, RRT  Head of Bed [facility]) Positioning: HOB at 30-45 degrees  VAP Prevention Bundle:   HOB elevation maintained   oral care regularly provided  Oral Care:   mouth swabbed   oral rinse provided   suction provided   teeth brushed   tongue brushed     Problem: Artificial Airway  Goal: Effective Communication  Outcome: Ongoing - Unchanged  Goal: Optimal Device Function  Outcome: Ongoing - Unchanged  Intervention: Optimize Device Care and Function  Recent Flowsheet Documentation  Taken 05/05/2024 1420 by Steffan Marney MATSU, RRT  Airway/Ventilation Management:   airway patency maintained   calming measures promoted humidification applied   pulmonary hygiene promoted  Taken 05/05/2024 0806 by Ashawnti Tangen G, RRT  Airway/Ventilation Management:   airway patency maintained   calming measures promoted   humidification applied   pulmonary hygiene promoted  Oral Care:   mouth swabbed   oral rinse provided   suction provided   teeth brushed   tongue brushed  Goal: Absence of Device-Related Skin or Tissue Injury  Outcome: Ongoing - Unchanged

## 2024-05-05 NOTE — Progress Notes (Signed)
 Hospital Medicine Daily Progress Note    Assessment/Plan:  [name] is a 78 y.o. female with Pmhx notable for HTN, MetALD cirrhosis, severe obseity, and persistent a fib who presented to [facility] initially for encephalopathy following an aborted DCCV (LAA thrombus found). She subsequently had a complex hospital stay with multiple ICU admissions, PEA arrest, multiple intubations and eventually tracheostomy. Currently weaning trach, and providing NGT feeds, but mental status continues to be poor    Acute hypoxemic and hypercarbic respiratory failure  Trach/vent depedent  Apneic events  D/w RT and weaning team. Plan to decrease pressure support very slowly, as she has become very hypercarbic in the past with a faster wean. Only on 25% FiO2, encephalopathy seems to be driving issues at this point given apneic events  - Routine trach care per RT  - Appreciate assistance with vent weaning  - VBG QAM since we are adjusting vent settings.     Hypotension:  Recurrent issue, had septic shock earlier in hospitalization. Seems to be sensitive to diuresis  - Improved with albumin  12/2    Encephalopathy, multifactorial:  Focal non-convulsive seizures  Multiple drivers, prolonged ICU stays, cardiac arrest, metabolic derangements have been corrected, likely some hepatic encephalopathy. Did have subclinical seizures earlier in her course as well. Cefepime  also possible contributing. Avoiding other centrally acting medications   - Continue Lactulose , only hold if >1058ml stool via FMS  - Ongoing re-evalaution especially with family at bedside as able  - Per prior neuro recommendations, repeated MRI brain, area of restricted diffusion is improved, but have asked them to weigh in on prognosis for recovery  - Continue Levetiracetam  750mg  BID and Lacosamide  100mg  BID    Anasarca:  RLE erythema/edema  2/2 prolonged critical illness, underlying cirrhosis, and hypoalbuminemia. RLE is worse than other limbs (though R side overall is more edematous).   - Check for RLE DVT  - If fevers, would start Cefazolin  as empiric cellulitis coverage   - Lasix  40mg  spot dosing with close monitoring of BP, and BMP BID, currently not tolerating 2/2 hypotension    VAP 2/2 PsA and MSSA  S.p 7 day course 12/3    Persistent A fib  LAA thrombus:  Rate controlled  - Metop 25mg  BID  - Apixaban  5mg  BID     Decompensated MetALD cirrhosis   - Volume: anasarca as above, multiple POCUS evals without ascites  - Infection: no e/o SBP, PNA treated as above  - Bleeding: had MWT earlier in course, continue PPI BID, no e/o varices  - Encephalopathy: Rifaxamin and Lactulose  to goal of stool output via FMS    Severe protein calorie malnutrition  Hypernatremia, resolved  - Continue feeds via NGT, depending on GOC, and LTAC abilities, may need to pursue PEG. Alerted her daughter to this, will revisit over the weekend and plan for PEG Monday if that is the road we go down.   - FWF q2h     Chronic diastolic heart failure (HFimpEF)  Not an active issue; edema unlikely cardiogenic in nature  -- strict intake / output     Right internal jugular / carotid fistula, resolved  Apparently thrombosed; no flow on carotid doppler 03/26/2024  -- discuss with vascular surgery whether any follow-up needed before discharge    GOC:  Multiple prior ACP discussions. Appreciate palliative care input. Goal is for functional recovery, unfortunately mental status appears to be very waxing/waning and has been worse the last several days  - S/p family meeting  12/4 over the phone, provided updates, daughter still wants to give her some time, is interested in hearing neurology's opinion on MRI and prognosis for recovery     Advanced care planning  - Code status: Full  - Healthcare proxy: daughter, [name]  - Disposition: LTACH - CM looking into if they can take her with an NGT  PT/OT recommendations: 5xlow     FEN/GI/PPX  - Diet: Nutren 2.0 continuous TF   - IVF: None  - Bowel regimen: lactulose   - DVT ppx: On therapeutic AC    I personally spent 60 minutes face-to-face and non-face-to-face in the care of this patient, which includes all pre, intra, and post visit time on the date of service.  All documented time was specific to the E/M visit and does not include any procedures that may have been performed.    [name] Deanne Franco, MD  Assistant Professor of Medicine    ___________________________________________________________________    Subjective:  Not opening eyes or responsive. Notified by RT that she is having to be put back on a rate as she is having apneic episodes.     Discussed care with pt's daughter [name] over the phone with palliative care, Dr. Glenora assistance. She is planning to come visit tomorrow and hopes that we will see some of the improvements her mom has when she is present     Labs/Studies:  Labs and Studies from the last 24hrs per EMR and Reviewed    Objective:  Temp:  [36.1 ??C (97 ??F)-37.4 ??C (99.4 ??F)] 36.1 ??C (97 ??F)  Pulse:  [78-110] 95  SpO2 Pulse:  [78-109] 104  Resp:  [9-40] 14  BP: (82-145)/(37-96) 109/84  FiO2 (%):  [25 %-28 %] 25 %  SpO2:  [96 %-100 %] 100 %    Gen: lying in bed, does not respond except to grimace to pain   HEENT: Dry MM, NGT in place with feeds running, trach in place, on PS on vent, miniimal secretions, suctioned by RN  CV: irregular, S1/S2, no MRG  Pulm: CTAB, no WRR  Abd: soft, non-tender, normal BS  Ext: diffuse anasarca, legs are weeping, R>L ansarca, R leg more erythematous today. LUE PICC in place   Neuro: unresponsive except to grimace to painful stimuli, moves arms and legs spontaneously and to painful stimuli

## 2024-05-05 NOTE — Plan of Care (Signed)
 Pt is unable to follow commands, UTA mentation. Brain MRI obtained, tolerated well. BP dropped to 80s/40s after returning from MRI, 1L LR given, BP corrected to 120s/80s after it finished. Pt appeared anxious after the scan and struggled to sleep, gave one time atarax  dose to help with tylenol . Corpak intact running continuous tube feeds. PICC intact. FMS intact. Standard precautions maintained, no falls/injuries this shift. All monitors with appropriate alarm settings, see flowsheets/MAR for further info          Shift Summary  MAP dropped to low levels during the late evening, and lactated ringers  were given to support blood pressure.  Patient was transported to MRI and received gadopiclenol  for contrast without complications.  Oxygen saturation remained high and FiO2 was reduced by the end of the shift, with airway management interventions maintained.  Respiratory rate showed significant fluctuation but returned to lower values, and breathing pattern stayed regular and unlabored.  Overall, patient remained on ventilator support and was unable to express needs or interact during the shift.    Readiness for Transition of Care: No significant changes in unplanned readmission score were observed, and patient transport for MRI was completed without incident.    Effective Breathing Pattern: Respiratory rate fluctuated, with periods of abnormal elevation followed by a decrease to lower values; respiratory pattern remained regular and unlabored throughout the shift.    Optimal Gas Exchange: Oxygen saturation remained consistently high and FiO2 requirements decreased slightly by the end of the shift, with airway patency and humidification maintained.    Blood Pressure in Desired Range: MAP values varied widely, with several episodes of low readings before stabilizing closer to desired range later in the shift; lactated ringers  were administered during a period of low MAP.

## 2024-05-05 NOTE — Consults (Signed)
 Follow-Up Consult Note        Requesting Attending Physician:  Therisa Deanne Franco, MD  Service Requesting Consult: [facility])     Assessment and Plan     [name] is a 78 y.o. female pmhx HFpEF, afib, and MASLD cirrhosis admitted with respiratory failure, hepatic encephalopathy, and septic shock course with course c/b hypernatremia on whom I have been asked by Therisa Deanne Franco, MD to consult for c/f seizure, re-consulted on 12/4 for neuro-prognostication.     # Roving eye movements- Resolved:  # Metabolic encephalopathy- Persistent:  Neurology team consulted for evaluation of possible nonrhythmic roving eye movements lasting several hours in duration, in context of progressively declining mental status over 3-4 days (previously intermittently following commands). Roving eye movements are a sign of diffuse cortical dysfunction with preserved brainstem function, most often seen in metabolic encephalopathy. Similarly presenting symptoms include ping-pong gaze (often associated with metabolic encephalopathy, diffuse cortical dysfunction, or toxic ingestion), and periodic alternating gaze deviation (seen in metabolic encephalopathies. This is consistent with patients long complicated course- initially admitted to OSH with DCCV/TEE which was aborted d/t LAA thrombus, multiple episodes of hypercarbic respiratory failure eventually requiring tracheostomy, PEA arrest 10/24 w/ ROSC after min, shock likely 2/2 GIB, E.coli bacteremia, and multiple episodes of hyperammonemia (31 on 9/30, 251 10/10--> 82 10/13-> 160 10/24-> 291 -> 65 11/18). Eye movements have been thought to be a separate issue unrelated to seizures. Since initial consult, these movements have resolved.      # Non-convulsive Seizures  cvEEG w/ LPD+F c/f IIC with possible ictal features.  Transient improvement with LEV load (transitioned to burst attenuation pattern with persistent bilateral posterior BIRDs). LCS load was added again with transient improvement to bilateral posterior LPDs 1 hz.  remained stable since on Vimpat  100mg  BID and Keppra  750mg  BID  Repeat cvEEG obtained 04/25/2024, re-demonstrating bilateral LPDs of the bilateral posterior regions, without any identifiable electrographic seizures.    # Diffuse Cortical Restriction improved on repeat MRI brain   MRI 11/17 w/ diffuse cortical restriction of the bilateral parieto-occipital regions concerning for prolonged seizure vs PRES vs hypoxic ischemic encephalopathy vs hyperammonemia- All new since 11/2 MRI. Imaging repeated on 12/3 with notable improvement of diffusion restriction in occipital lobes as well as improvement of overall T2 FLAIR abnormalities compared to prior imaging. Favored to be from hyperammonemia. Neurological exam for examiner today with minimal improvement-  spontaneous movements antigravity BUE and brisk w/d to sole stroking in BLE, per chart review episodes of being more awake and maybe interacting with family? Overall given severe toxic metabolic and infectious insults patient has had she most likely needs more time for recovery, though it is very difficult to prognosticate what the degree of recovery will be.       Recommendations:  Continue Keppra  1g BID and Vimpat  100 mg BID  (renally dose medications per pharmacy)  continued lactulose /BM per primary team     Dr. Vicci was available     Rennis Peed, MD, PGY-4  Department of Neurology        HPI        Reason for Consult: c/f seizure    [name] is a 78 y.o. female on whom I have been asked by Therisa Deanne Franco, MD to consult for c/f seizure.    Interval History 12/4:  - Patient with minimal improvement   - Resolution of diffusion restriction on MRI     Interval History 11/24:  -  Exam stable from prior  - cvEEG re-hooked 13:18. Discussed with Epilepsy faculty regarding captured data up to 19:14, demonstrating bilateral LPDs without electrographic seizure. EEG discontinued.    Interval History 11/23:  - Patient has been stable, but no improvements in exam since 11/20.     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 Also demonstrated moderate canal stenosis at C4-C5, moderate/severe canal stenosis at C6-C7, as well as severe neuroforaminal stenosis at bilateral C5-C6 and left C6-C7.   - Exam today improved from prior, patient with midline gaze, somewhat tracking/regarding, not following commands, but grimaces and semi-localizes to noxious in BUE, and weak w/d to nox in BLE.     Interval History 11/18:   - Overnight was found to have EEG findings c/w IIC (LPDs+F), prompting initiation of LEV+LCS with some transient improvement  - MRI Brain w/wo was obtained, demosntrating diffuse bilateral parieto-occipital restricted diffusion. Case discussed on Neuroradiology case conference, felt to be most compatible with toxic-metabolic primary etiology.   - Re-reviewed overall history given complicated course, as detailed more extensively in A&P  - Exam now with midline gaze and intact OCRs, overall improved from prior    Initial HPI:  78 yo PMHx HFpEF, afib, and MASLD cirrhosis (MELD 15) admitted for respiratory failure, encephalopathy, and septic shock course with c/b hypernatremia.     At around 1930 on 11/17 was noted to have roving horizontal eye movements. Movements were nonrhythmic but persisted for several hours raising primary teams concerns for possible seizure. They gave 4mg  versed  and per their report movements only slowed down for a brief duration.     No reported facial twitching, limb shaking, fixed lateralized gaze, urinary incontinence, or tongue injury. Per chart review, no past history of seizures.     Per nursing report, patient has gradually become less and less responsive over the last 3 days. She was following commands and moving extremities (squeeze hands, wiggle toes) on 11/14, but now is unresponsive and has no spontaneous movement.     Lactulose  was held for intermittent periods starting 11/12 and was discontinued on 11/15 due to increased bowel movement frequency (per chart review had 5 BMs on 11/15). Since worsened mentation on 11/16,  lactulose  was resumed.   Ammonia 291 on 11/16, previously 160 on 10/24    Earlier in ICU stay had E. Coli bacteremia -Finished course of ceftriaxone .     On a CTA abdomen in October 2025 was noted to have a chronic T12 compression fracture.   On a CT cervical spine in May 2024 was noted to have questionable lucency involving the left lateral aspect of C2 vertebral body.     Has known history of osteoporosis identified on Dexa scan of femoral neck (11/2022 T score -2.9).     Allergies[1]     Current Medications[2] Prescriptions Prior to Admission[3]    Past Medical History[4]    Past Surgical History[5]    Social History[6]    Family History[7]    Code Status: Full Code     Review of Systems     Unable to obtain due to patient's mental status.       Objective        Temp:  [36.1 ??C (97 ??F)-37.4 ??C (99.3 ??F)] 36.1 ??C (97 ??F)  Pulse:  [78-110]  95  SpO2 Pulse:  [78-109] 104  Resp:  [9-40] 14  BP: (82-145)/(37-96) 109/84  MAP (mmHg):  [51-103] 90  FiO2 (%):  [25 %-28 %] 25 %  SpO2:  [96 %-100 %] 100 %  I/O this shift:  In: 310 [NG/GT:310]  Out: 850 [Urine:250; Stool:600]      Physical Exam:  General Exam:  General: Lying in bed. No obvious distress.   ENT:  Mucous membranes moist. Oropharynx clear.Has dried blood on lips   Cardiovascular:  Regular rate and rhythm.   Respiratory: trach in place.  Gastrointestinal: soft  Extremities: Edema in upper extremities bilateral.   Edema in lower extremities bilateral.    Skin: No obvious rashes or ecchymoses.    Neurological Exam:  Mental Status  LOC: arouses to voice  Orientation: UTA  Speech: UTA  Briefly tracks/regards  Does not follow commands.     Previously: Unable to arouse    Cranial Nerves  Pupils: PERRL 5mm -> 4mm  Corneals: present  Gaze: Midline gaze, no preference or forced gaze, spontaneous movements at timed regards  Previously: some left gave preference. Previously: slow roving, nonrhythmic, horizontal eye movements   Face Motor: normal and symmetric  Oculocephalic: present  Previously:   Cough: strong  Gag: strong    Motor:   Normal tone   Globally decreased muscle bulk   Spontaneous movements 3/5 in BUE   2/5 and brisk w/d to sole stimulation in BLE   No purposeful movements observed     Previously:   RUE: Semi-localizes (reaches towards source of noxious) Previously: no response  LUE: Semi-localizes (reaches towards source of nox, crosses midline) Previously:  no response  RLE: Weak withdrawal to nox. Previously: brisk toe and foot extension to light touch over the dorsum of the foot and triple flexion to noxious  LLE: Weak withdrawal to nox. Previously:  toe and foot extension to light touch over the dorsum of the foot and triple flexion to noxious     Sensory:  response to light touch and noxious as above    Reflexes:  Deferred, previously:  Reflexes Right Left   Biceps  C5 3+ 3+   Brachioradialis C6  3+ 3+   Triceps C7 3+ 3+   Patella L3 (4) N/a N/a   Achilles S1 (S2) >4+ beats clonus >4+ beats clonus   Plantar Response Extensor Extensor   Jaw Jerk CN V Absent   Pectoralis C5-T1 Present Present   Hoffman Present Absent   Cross Adductors L2-4 Absent Absent        Diagnostic Studies      All Labs Last 24hrs:   Recent Results (from the past 24 hours)   Basic Metabolic Panel    Collection Time: 05/05/24  4:14 AM   Result Value Ref Range    Sodium 141 135 - 145 mmol/L    Potassium 3.5 3.4 - 4.8 mmol/L    Chloride 104 98 - 107 mmol/L    CO2 30.0 20.0 - 31.0 mmol/L    Anion Gap 7 5 - 14 mmol/L    BUN 17 9 - 23 mg/dL    Creatinine 9.58 (L) 0.55 - 1.02 mg/dL    BUN/Creatinine Ratio 41     eGFR CKD-EPI (2021) Female >90 >=60 mL/min/1.67m2    Glucose 133 70 - 179 mg/dL    Calcium  8.3 (L) 8.7 - 10.4 mg/dL   Blood Gas, Venous    Collection Time: 05/05/24  4:14 AM   Result Value Ref  Range    Specimen Source Venous     FIO2 Venous Not Specified     pH, Venous 7.45 (H) 7.32 - 7.43    pCO2, Ven 40 40 - 60 mm Hg    pO2, Ven 43 30 - 55 mm Hg    HCO3, Ven 27 22 - 27 mmol/L    Base Excess, Ven 3.5 (H) -2.0 - 2.0    O2 Saturation, Venous 74.5 40.0 - 85.0 %    Carboxyhemoglobin, Venous 1.9 (H) <1.2 %    Methemoglobin, Venous <1.0 <1.5 %    Oxyhemoglobin Venous 73.1 40.0 - 85.0 %   Phosphorus Level    Collection Time: 05/05/24  4:14 AM   Result Value Ref Range    Phosphorus 2.2 (L) 2.4 - 5.1 mg/dL     MRI brain w.wo 87/5   Resolution of previously seen diffuse cortical restricted diffusion with mild residual parietal occipital cortical signal abnormalities as described. No new area of restricted diffusion and no abnormal enhancement.      Unchanged findings of chronic microvascular ischemic disease.       CT head 11/17   No acute abnormalities on personal review    MRI Brain W Wo Contrast, 04/03/24  Impression  Multiple sequences are degraded due to motion which limits evaluation.  Within the limitations no acute intracranial abnormality.  Severe periventricular and deep white matter T2/FLAIR hyperintense signal compatible with small vessel ischemic changes. Multiple microhemorrhages as described. Consider cognitive assessment.  Remote left cerebellar infarct.  Small right mastoid effusion.    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      Moderate to severe central canal stenosis at C6-7 and moderate canal stenosis at C4-C5 secondary to disc osteophyte complex and ligament flavum hypertrophy.      Severe neural foraminal stenosis bilaterally at C5-6. Severe left-sided neural foraminal stenosis at C6-7.                  Attending Physician Attestation:  I was available to discuss the history, physical exam and formulate an assessment and plan. I have reviewed the note for accuracy and appropriate coding.     Geni CHARM Louder, MD, MS  Clinical Assistant Professor of Neurology  [facility]               [1]   Allergies  Allergen Reactions    Rocuronium  Anaphylaxis     Suspected intraoperative anaphylaxis 04/15/2024. See anesthetic from tracheostomy. Tryptase pending at time of listing.     Sulfa (Sulfonamide Antibiotics) Anaphylaxis    Sulfur  Anaphylaxis     Tolerated sulfur  colloid injection without incident 09/03/2020.    Aspirin      thrombocytopenia   [2]   Current Facility-Administered Medications   Medication Dose Route Frequency Provider Last Rate Last Admin    acetaminophen  (TYLENOL ) tablet 650 mg  650 mg Oral Q4H PRN Joesph Prentice SQUIBB, MD   650 mg at 05/05/24 0413    apixaban  (ELIQUIS ) tablet 5 mg  5 mg Enteral tube: gastric BID Joesph Prentice SQUIBB, MD   5 mg at 05/05/24 9173    cyanocobalamin  (vitamin B-12) tablet 1,000 mcg  1,000 mcg Enteral tube: gastric Daily Joesph Prentice SQUIBB, MD   1,000 mcg at 05/05/24 9173    ergocalciferol  (DRISDOL ) oral drops 200 mcg/mL (8,000 unit/mL)  100 mcg  Enteral tube: gastric Daily Joesph Prentice SQUIBB, MD   100 mcg at 05/05/24 9173    esomeprazole  (NEXIUM ) granules 40 mg  40 mg Enteral tube: gastric Daily Joesph Prentice SQUIBB, MD   40 mg at 05/05/24 9175    famotidine  (PEPCID ) tablet 20 mg  20 mg Enteral tube: gastric Daily Joesph Prentice SQUIBB, MD   20 mg at 05/05/24 9173    flu vacc 2025-26 (65 yr up) (PF)(FLUAD)45 mcg(59mcgx3)/0.5 ml IM syringe  0.5 mL Intramuscular During hospitalization Joesph Prentice SQUIBB, MD        folic acid  (FOLVITE ) tablet 1 mg  1 mg Enteral tube: gastric Daily Joesph Prentice SQUIBB, MD   1 mg at 05/05/24 9175    lacosamide  (VIMPAT ) tablet 100 mg  100 mg Enteral tube: gastric BID Joesph Prentice SQUIBB, MD   100 mg at 05/05/24 9173 lactulose  oral solution  30 g Enteral tube: gastric TID Symmes, Anna Gravier, MD   30 g at 05/05/24 9173    levETIRAcetam  (KEPPRA ) tablet 750 mg  750 mg Enteral tube: gastric BID Joesph Prentice SQUIBB, MD   750 mg at 05/05/24 9175    melatonin tablet 3 mg  3 mg Enteral tube: gastric QPM Joesph Prentice SQUIBB, MD   3 mg at 05/04/24 8279    metoPROLOL  tartrate (Lopressor ) tablet 25 mg  25 mg Enteral tube: gastric BID Joesph Prentice SQUIBB, MD   25 mg at 05/04/24 2122    multivitamins, therapeutic with minerals tablet 1 tablet  1 tablet Enteral tube: gastric Daily Joesph Prentice SQUIBB, MD   1 tablet at 05/05/24 9173    rifAXIMin  (XIFAXAN ) oral suspension  550 mg Enteral tube: gastric BID Joesph Prentice SQUIBB, MD   550 mg at 05/05/24 9173    sodium chloride  (NS) 0.9 % flush 10 mL  10 mL Intravenous Q8H Joesph Prentice SQUIBB, MD   10 mL at 05/05/24 1007    sodium chloride  (NS) 0.9 % flush 10 mL  10 mL Intravenous Q8H Joesph Prentice SQUIBB, MD   10 mL at 05/05/24 1007    thiamine  mononitrate (vit B1) tablet 100 mg  100 mg Enteral tube: gastric Daily Joesph Prentice SQUIBB, MD   100 mg at 05/05/24 9175    zinc  acetate oral solution  50 mg elem zinc  Enteral tube: gastric Daily Joesph Prentice SQUIBB, MD   50 mg elem zinc  at 05/04/24 1721   [3]   Medications Prior to Admission   Medication Sig Dispense Refill Last Dose/Taking    anastrozole  (ARIMIDEX ) 1 mg tablet Take 1 tablet (1 mg total) by mouth daily. 90 tablet 3 03/10/2024 at  6:00 AM    carvedilol  (COREG ) 3.125 MG tablet Take 1 tablet (3.125 mg total) by mouth two (2) times a day. 180 tablet 2 03/10/2024 Morning    folic acid  (FOLVITE ) 1 MG tablet Take 1 tablet (1 mg total) by mouth daily.   03/10/2024 at  6:00 AM    furosemide  (LASIX ) 40 MG tablet Take 1 tablet (40 mg total) by mouth daily as needed. 30 tablet 11 03/10/2024 at  6:00 AM    magnesium  oxide (MAG-OX) 400 mg (241.3 mg elemental magnesium ) tablet Take 1 tablet (400 mg total) by mouth two (2) times a day. 180 tablet 3 03/10/2024 at  6:00 AM    potassium chloride  20 MEQ ER tablet Take 1 tablet (20 mEq total) by mouth two (2) times a day. 60 tablet 11 03/09/2024 at  6:00 AM    spironolactone  (ALDACTONE )  25 MG tablet Take 1 tablet (25 mg total) by mouth daily. 90 tablet 1 03/10/2024 at  6:00 AM    thiamine  (B-1) 100 MG tablet Take 1 tablet (100 mg total) by mouth daily.   03/10/2024 Morning    vitamin E-268 mg, 400 UNIT, 268 mg (400 UNIT) capsule Take 800 mg by mouth in the morning. 2 tabs.   03/10/2024 Morning    acetaminophen  (TYLENOL ) 325 MG tablet Take 2 tablets (650 mg total) by mouth every six (6) hours as needed for pain.  0     [Paused] alendronate  (FOSAMAX ) 70 MG tablet Take 1 tablet (70 mg total) by mouth every seven (7) days. (Patient not taking: Reported on 03/07/2024) 12 tablet 3     calcium  carbonate (OS-CAL) 1,250 mg (500 mg elem calcium ) tablet Take 1 tablet (500 mg elem calcium  total) by mouth in the morning. (Patient not taking: Reported on 03/07/2024)       cyanocobalamin  1000 MCG tablet Take 1 tablet (1,000 mcg total) by mouth daily.       [EXPIRED] nystatin  (MYCOSTATIN ) 100,000 unit/gram powder Apply to affected area 3 times daily 15 g 0    [4]   Past Medical History:  Diagnosis Date    A-fib (CMS-HCC)     Alcoholism    (CMS-HCC)     Alcoholism /alcohol abuse     Cirrhosis    (CMS-HCC)     Depression     Hypertension     Liver disease     Malignant neoplasm of overlapping sites of right breast in female, estrogen receptor positive    (CMS-HCC) 10/03/2020   [5]   Past Surgical History:  Procedure Laterality Date    BREAST BIOPSY Right     benign-a long time ago    BREAST BIOPSY Right 07/2020    malignant    BREAST LUMPECTOMY Right     4 2022    CENTRAL LINE  03/25/2024    CHOLECYSTECTOMY      PR BX/REMV,LYMPH NODE,DEEP AXILL Right 09/03/2020    Procedure: BX/EXC LYMPH NODE; OPEN, DEEP AXILRY NODE;  Surgeon: Alm Elsie Como, MD;  Location: ASC [facility];  Service: Surgical Oncology Breast    PR CARDIOVERSION, ELECTIVE;EXTERN N/A 03/10/2024    Procedure: CARDIOVERSION, ELECTIVE, ELECTRICAL CONVERSION OF ARRHYTHMIA; EXTERNAL;  Surgeon: Sedalia Velma Hamilton, MD;  [facility];  Service: Cardiology    PR ECHO HEART,TRANSESOPHAGEAL,COMPLETE Midline 03/10/2024    Procedure: ECHOCARDIOGRAPHY, TRANSESOPHAGEAL, REAL-TIME WITH IMAGE DOCUMENTATION;  Surgeon: Sedalia Velma Hamilton, MD;  Location[facility];  Service: Cardiology    PR INTRAOPERATIVE SENTINEL LYMPH NODE ID W DYE INJECTION Right 09/03/2020    Procedure: INTRAOPERATIVE IDENTIFICATION SENTINEL LYMPH NODE(S) INCLUDE INJECTION NON-RADIOACTIVE DYE, WHEN PERFORMED;  Surgeon: Alm Elsie Como, MD;  Location: [facility] OR [facility];  Service: Surgical Oncology Breast    PR MASTECTOMY, PARTIAL Right 09/03/2020    Procedure: MASTECTOMY, PARTIAL (EG, LUMPECTOMY, TYLECTOMY, QUADRANTECTOMY, SEGMENTECTOMY);  Surgeon: Alm Elsie Como, MD;  Location: [facility] OR [facility];  Service: Surgical Oncology Breast    PR TRACHEOSTOMY, PLANNED N/A 04/15/2024    Procedure: TRACHEOSTOMY PLANNED (SEPART PROC);  Surgeon: Pixie Loader, MD;  Location: OR [facility];  Service: ENT    PR UPPER GI ENDOSCOPY,BIOPSY N/A 02/03/2018    Procedure: UGI ENDOSCOPY; WITH BIOPSY, SINGLE OR MULTIPLE;  Surgeon: Eleanor Dewey Sorrel, MD;  Location: [facility] PROCED[facility];  Service: Gastroenterology    RADIATION Right     unsure finish date  TUBAL LIGATION     [6]   Social History  Socioeconomic History    Marital status: Married     Spouse name: None    Number of children: None    Years of education: None    Highest education level: None   Tobacco Use    Smoking status: Never     Passive exposure: Past    Smokeless tobacco: Never   Vaping Use    Vaping status: Never Used   Substance and Sexual Activity    Alcohol use: Not Currently    Drug use: Never    Sexual activity: Not Currently   Social History Narrative    Daughter fills med boxes weekly.      Social Drivers of Health     Food Insecurity: No Food Insecurity (03/02/2024)    Hunger Vital Sign     Worried About Running Out of Food in the Last Year: Never true     Ran Out of Food in the Last Year: Never true   Tobacco Use: Low Risk (04/25/2024)    Patient History     Smoking Tobacco Use: Never     Smokeless Tobacco Use: Never     Passive Exposure: Past   Transportation Needs: No Transportation Needs (03/02/2024)    PRAPARE - Transportation     Lack of Transportation (Medical): No     Lack of Transportation (Non-Medical): No   Alcohol Use: Not At Risk (04/20/2023)    Alcohol Use     How often do you have a drink containing alcohol?: Never     How many drinks containing alcohol do you have on a typical day when you are drinking?: 1 - 2     How often do you have 5 or more drinks on one occasion?: Never   Housing: Low Risk (03/02/2024)    Housing     Within the past 12 months, have you ever stayed: outside, in a car, in a tent, in an overnight shelter, or temporarily in someone else's home (i.e. couch-surfing)?: No     Are you worried about losing your housing?: No   Physical Activity: Inactive (04/20/2023)    Exercise Vital Sign     Days of Exercise per Week: 0 days     Minutes of Exercise per Session: 0 min   Utilities: Low Risk (04/20/2023)    Utilities     Within the past 12 months, have you been unable to get utilities (heat, electricity) when it was really needed?: No   Stress: No Stress Concern Present (04/20/2023)    [employer] of Occupational Health - Occupational Stress Questionnaire     Feeling of Stress : Only a little   Interpersonal Safety: Patient Unable To Answer (03/11/2024)    Interpersonal Safety     Unsafe Where You Currently Live: Patient unable to answer     Physically Hurt by Anyone: Patient unable to answer     Abused by Anyone: Patient unable to answer   Social Connections: Moderately Isolated (04/20/2023)    Social Connection and Isolation Panel     Frequency of Communication with Friends and Family: More than three times a week     Frequency of Social Gatherings with Friends and Family: More than three times a week     Attends Religious Services: Never     Database Administrator or Organizations: No     Attends Banker Meetings: Never     Marital Status: Married  Financial Resource Strain: Low Risk (03/02/2024)    Overall Financial Resource Strain (CARDIA)     Difficulty of Paying Living Expenses: Not hard at all   Health Literacy: Low Risk (04/20/2023)    Health Literacy     : Never   Internet Connectivity: No Internet connectivity concern identified (04/20/2023)    Internet Connectivity     Do you have access to internet services: Yes     How do you connect to the internet: Personal Device at home     Is your internet connection strong enough for you to watch video on your device without major problems?: Yes     Do you have enough data to get through the month?: Yes     Does at least one of the devices have a camera that you can use for video chat?: Yes   [7]   Family History  Problem Relation Age of Onset    Heart disease Mother     No Known Problems Father     No Known Problems Sister     No Known Problems Daughter     No Known Problems Maternal Grandmother     No Known Problems Maternal Grandfather     No Known Problems Paternal Grandmother     No Known Problems Paternal Grandfather     Lupus Brother     No Known Problems Other     BRCA 1/2 Neg Hx     Breast cancer Neg Hx     Cancer Neg Hx     Colon cancer Neg Hx     Endometrial cancer Neg Hx     Ovarian cancer Neg Hx     Mental illness Neg Hx     Substance Abuse Disorder Neg Hx

## 2024-05-06 LAB — BASIC METABOLIC PANEL
ANION GAP: 12 mmol/L (ref 5–14)
BLOOD UREA NITROGEN: 15 mg/dL (ref 9–23)
BUN / CREAT RATIO: 43
CALCIUM: 8.1 mg/dL — ABNORMAL LOW (ref 8.7–10.4)
CHLORIDE: 105 mmol/L (ref 98–107)
CO2: 26 mmol/L (ref 20.0–31.0)
CREATININE: 0.35 mg/dL — ABNORMAL LOW (ref 0.55–1.02)
EGFR CKD-EPI (2021) FEMALE: >90 mL/min/1.73m2 (ref >=60)
GLUCOSE RANDOM: 143 mg/dL (ref 70–179)
POTASSIUM: 3.8 mmol/L (ref 3.4–4.8)
SODIUM: 143 mmol/L (ref 135–145)

## 2024-05-06 LAB — BLOOD GAS, VENOUS
BASE EXCESS VENOUS: 3.7 — ABNORMAL HIGH (ref -2.0–2.0)
CARBOXYHEMOGLOBIN, VENOUS: 2.1 % — ABNORMAL HIGH (ref <1.2)
HCO3 VENOUS: 27 mmol/L (ref 22–27)
METHEMOGLOBIN, VENOUS: <1 % (ref <1.5)
O2 SATURATION VENOUS: 87.8 % — ABNORMAL HIGH (ref 40.0–85.0)
OXYHEMOGLOBIN, VENOUS: 85.6 % — ABNORMAL HIGH (ref 40.0–85.0)
PCO2 VENOUS: 38 mmHg — ABNORMAL LOW (ref 40–60)
PH VENOUS: 7.47 — ABNORMAL HIGH (ref 7.32–7.43)
PO2 VENOUS: 52 mmHg (ref 30–55)

## 2024-05-06 LAB — HEPATIC FUNCTION PANEL
ALBUMIN: 2.1 g/dL — ABNORMAL LOW (ref 3.4–5.0)
ALKALINE PHOSPHATASE: 215 U/L — ABNORMAL HIGH (ref 46–116)
ALT (SGPT): 18 U/L (ref 10–49)
AST (SGOT): 54 U/L — ABNORMAL HIGH (ref <=34)
BILIRUBIN DIRECT: 0.3 mg/dL (ref 0.00–0.30)
BILIRUBIN TOTAL: 0.6 mg/dL (ref 0.3–1.2)
PROTEIN TOTAL: 5.5 g/dL — ABNORMAL LOW (ref 5.7–8.2)

## 2024-05-06 LAB — CBC W/ AUTO DIFF
BASOPHILS ABSOLUTE COUNT: 0.1 10*9/L (ref 0.0–0.1)
BASOPHILS RELATIVE PERCENT: 1 %
EOSINOPHILS ABSOLUTE COUNT: 0.3 10*9/L (ref 0.0–0.5)
EOSINOPHILS RELATIVE PERCENT: 4.5 %
HEMATOCRIT: 25.7 % — ABNORMAL LOW (ref 34.0–44.0)
HEMOGLOBIN: 8.2 g/dL — ABNORMAL LOW (ref 11.3–14.9)
LYMPHOCYTES ABSOLUTE COUNT: 1.1 10*9/L (ref 1.1–3.6)
LYMPHOCYTES RELATIVE PERCENT: 17.8 %
MEAN CORPUSCULAR HEMOGLOBIN CONC: 31.9 g/dL — ABNORMAL LOW (ref 32.0–36.0)
MEAN CORPUSCULAR HEMOGLOBIN: 29.9 pg (ref 25.9–32.4)
MEAN CORPUSCULAR VOLUME: 93.6 fL (ref 77.6–95.7)
MEAN PLATELET VOLUME: 9 fL (ref 6.8–10.7)
MONOCYTES ABSOLUTE COUNT: 0.5 10*9/L (ref 0.3–0.8)
MONOCYTES RELATIVE PERCENT: 8.5 %
NEUTROPHILS ABSOLUTE COUNT: 4.2 10*9/L (ref 1.8–7.8)
NEUTROPHILS RELATIVE PERCENT: 68.2 %
PLATELET COUNT: 169 10*9/L (ref 150–450)
RED BLOOD CELL COUNT: 2.74 10*12/L — ABNORMAL LOW (ref 3.95–5.13)
RED CELL DISTRIBUTION WIDTH: 18.2 % — ABNORMAL HIGH (ref 12.2–15.2)
WBC ADJUSTED: 6.1 10*9/L (ref 3.6–11.2)

## 2024-05-06 LAB — LACTATE, VENOUS, WHOLE BLOOD: LACTATE BLOOD VENOUS: 1.6 mmol/L (ref 0.5–1.8)

## 2024-05-06 LAB — MAGNESIUM: MAGNESIUM: 2 mg/dL (ref 1.6–2.6)

## 2024-05-06 LAB — PHOSPHORUS: PHOSPHORUS: 2.5 mg/dL (ref 2.4–5.1)

## 2024-05-06 MED ADMIN — sodium chloride (NS) 0.9 % flush 10 mL: 10 mL | INTRAVENOUS | @ 16:00:00

## 2024-05-06 MED ADMIN — sodium chloride (NS) 0.9 % flush 10 mL: 10 mL | INTRAVENOUS | @ 09:00:00

## 2024-05-06 MED ADMIN — rifAXIMin (XIFAXAN) oral suspension: 550 mg | GASTROENTERAL | @ 01:00:00 | Stop: 2025-04-11

## 2024-05-06 MED ADMIN — rifAXIMin (XIFAXAN) oral suspension: 550 mg | GASTROENTERAL | @ 13:00:00 | Stop: 2025-04-11

## 2024-05-06 MED ADMIN — lactulose oral solution: 30 g | GASTROENTERAL | @ 01:00:00

## 2024-05-06 MED ADMIN — lactulose oral solution: 30 g | GASTROENTERAL | @ 20:00:00

## 2024-05-06 MED ADMIN — lactulose oral solution: 30 g | GASTROENTERAL | @ 13:00:00

## 2024-05-06 MED ADMIN — folic acid (FOLVITE) tablet 1 mg: 1 mg | GASTROENTERAL | @ 13:00:00

## 2024-05-06 MED ADMIN — thiamine mononitrate (vit B1) tablet 100 mg: 100 mg | GASTROENTERAL | @ 13:00:00

## 2024-05-06 MED ADMIN — ergocalciferol (DRISDOL) oral drops 200 mcg/mL (8,000 unit/mL): 100 ug | GASTROENTERAL | @ 13:00:00 | Stop: 2024-05-21

## 2024-05-06 MED ADMIN — cyanocobalamin (vitamin B-12) tablet 1,000 mcg: 1000 ug | GASTROENTERAL | @ 13:00:00

## 2024-05-06 MED ADMIN — levETIRAcetam (KEPPRA) tablet 750 mg: 750 mg | GASTROENTERAL | @ 01:00:00

## 2024-05-06 MED ADMIN — levETIRAcetam (KEPPRA) tablet 750 mg: 750 mg | GASTROENTERAL | @ 13:00:00

## 2024-05-06 MED ADMIN — famotidine (PEPCID) tablet 20 mg: 20 mg | GASTROENTERAL | @ 13:00:00

## 2024-05-06 MED ADMIN — apixaban (ELIQUIS) tablet 5 mg: 5 mg | GASTROENTERAL | @ 13:00:00

## 2024-05-06 MED ADMIN — apixaban (ELIQUIS) tablet 5 mg: 5 mg | GASTROENTERAL | @ 01:00:00

## 2024-05-06 MED ADMIN — esomeprazole (NEXIUM) granules 40 mg: 40 mg | GASTROENTERAL | @ 13:00:00

## 2024-05-06 MED ADMIN — multivitamins, therapeutic with minerals tablet 1 tablet: 1 | GASTROENTERAL | @ 13:00:00

## 2024-05-06 MED ADMIN — metoPROLOL tartrate (Lopressor) tablet 25 mg: 25 mg | GASTROENTERAL | @ 01:00:00

## 2024-05-06 MED ADMIN — metoPROLOL tartrate (Lopressor) tablet 25 mg: 25 mg | GASTROENTERAL | @ 13:00:00

## 2024-05-06 MED ADMIN — lacosamide (VIMPAT) tablet 100 mg: 100 mg | GASTROENTERAL | @ 13:00:00

## 2024-05-06 MED ADMIN — lacosamide (VIMPAT) tablet 100 mg: 100 mg | GASTROENTERAL | @ 01:00:00

## 2024-05-06 MED ADMIN — acetaminophen (TYLENOL) tablet 650 mg: 650 mg | ORAL | @ 19:00:00

## 2024-05-06 MED ADMIN — ceFAZolin (ANCEF) IVPB 1 g in 50 mL dextrose (premix): 1 g | INTRAVENOUS | @ 23:00:00 | Stop: 2024-05-13

## 2024-05-06 MED ADMIN — ceFAZolin (ANCEF) IVPB 1 g in 50 mL dextrose (premix): 1 g | INTRAVENOUS | @ 17:00:00 | Stop: 2024-05-13

## 2024-05-06 MED ADMIN — oxyCODONE (ROXICODONE) immediate release tablet 5 mg: 5 mg | ORAL | @ 20:00:00 | Stop: 2024-05-06

## 2024-05-06 MED ADMIN — hydrOXYzine (ATARAX) tablet 10 mg: 10 mg | ORAL | @ 23:00:00 | Stop: 2024-05-06

## 2024-05-06 NOTE — Consults (Signed)
 WIND Team Follow Up Consult Note     Date of Service: 05/06/2024  Requesting Physician: Therisa Deanne Franco, MD   Requesting Service: [facility])  Reason for consultation: Comprehensive evaluation of tracheostomy management and mechanical ventilation.    Hospital Problems:  Principal Problem:    Bacteremia, escherichia coli  Active Problems:    Alcoholic liver disease (HHS-HCC)    HTN (hypertension)    Depression with anxiety    Class 2 severe obesity with serious comorbidity in adult    Atrial fibrillation    (CMS-HCC)    Pleural effusion    Hypernatremia    Thrombocytopenia    Cirrhosis    (CMS-HCC)    A-fib (CMS-HCC)    Hepatic encephalopathy    (CMS-HCC)    Anaphylaxis      HPI: [name] is a 78 y.o. 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 Now transferred out of MICU on 05/02/2024 for wean of ventilator support.       Recommendations       Planned Vent Settings/Goals:  - Recommend resuming PSV trials over the weekend. Consider less pressure support (10/5 would be reasonable) given prior VBGs showing respiratory alkalosis.   - Limit CNS depressing agents if able given apnea   - Ultimately would like to trial on [facility] however mental status is of concern and limiting her ability to wean.     - Tracheostomy  6 shiley  - Barriers to wean include mental status    #Nutrition Plan:   Continue enteral nutrition.    #Speech Plan:  Speaking valve when off vent. Still not ready    #Physical Therapy plan:   Mobilize as tolerated.           This patient was seen and evaluated with Dr.Dover. Please do not hesitate to page [phone] Monday - Friday from 8AM-3PM with questions. We appreciate the opportunity to assist in the care of this patient. The recommendations outlined in this note were discussed w the primary team via epic chat. We look forward to following with you.     [name], MD    Assessment          Impression: The patient has acute illness with systemic symptoms. My interpretation of the interval data I personally reviewed, interpreted and/or analyzed is notable for vent settings with FiO2 25%, PSV 14/5 with good oxygenation and gas exchange..      Problems addressed during this consult include Vent mgmt.       Subjective & Objective     Subjective: Non-verbal but was placed back on a rate today after apneic periods.          Vitals - past 24 hours  Temp:  [37.1 ??C (98.8 ??F)-37.9 ??C (100.3 ??F)] 37.7 ??C (99.8 ??F)  Pulse:  [92-125] 97  SpO2 Pulse:  [93-124] 99  Resp:  [14-26] 21  BP: (100-161)/(62-93) 100/62  FiO2 (%):  [25 %] 25 %  SpO2:  [93 %-99 %] 98 % Intake/Output  I/O last 3 completed shifts:  In: 1710 [NG/GT:1710]  Out: 2815 [Urine:1115; Stool:1700]      Pertinent exam findings:    General - NAD  HEENT - Trachea midline, anicteric  CV - RRR  Resp -PRVC,  equal chest rise, coarse breath sounds   GI - abdomen soft, non-tender non-distended  Skin -  no clubbing or cyanosis    Pertinent Imaging Data   - CXR on 04/28/24 revealed bilateral effusions with fluid in the R. Pleural fissure.       Current vent settings:  S RR:  [12] 12  FiO2 (%):  [25 %] 25 %  S VT:  [350 mL-400 mL] 350 mL  O2 Device: Ventilator    Arterial Blood Gas:   No results for input(s): SPECTYPEART, PHART, PCO2ART, PO2ART, HCO3ART, BEART, O2SATART in the last 24 hours.     Venous Blood Gas:   Recent Labs     Units 05/06/24  0519   PHVEN  7.47*   PCO2VEN mm Hg 38*   PO2VEN mm Hg 52   HCO3VEN mmol/L 27   BEVEN  3.7*   O2SATVEN % 87.8*        Cultures:  Blood Culture, Routine (no units)   Date Value   04/17/2024 No Growth at 5 days   04/17/2024 No Growth at 5 days     Urine Culture, Comprehensive (no units)   Date Value 04/17/2024 NO GROWTH   04/09/2024 NO GROWTH     Lower Respiratory Culture (no units)   Date Value   04/28/2024 2+ Methicillin-susceptible Staphylococcus aureus (A)   04/28/2024 2+ Pseudomonas aeruginosa (A)     WBC (10*9/L)   Date Value   05/06/2024 6.1     WBC, UA (/HPF)   Date Value   04/17/2024 1          Allergies & Home Medications   Personally reviewed in Epic    Medications:  Personally reviewed in Epic, see recommendations for updates           Hospital Course:   - Refer to primary team notes.     Medical Decision Making on 05/06/2024     External Notes:    I reviewed the following: Progress note(s) , Consultant note(s) , Nursing note(s), and Therapist note(s)    Results:   I reviewed the following labs/reports for this consult - CBC unremarkable or unchanged from prior., chemistry unremarkable or unchanged from prior., and radiology reports unremarkable or unchanged from prior.    Tests Ordered:   See recommendations above    Historian: no independent historian required    Imaging: I independently viewed and interpreted the following studies for this consult - CXR images  and CT images       Risks of management:    - Management of ventilator settings.  - Management of airway clearance devices/medications.

## 2024-05-06 NOTE — Progress Notes (Signed)
 Hospital Medicine Daily Progress Note    Assessment/Plan:  [name] is a 78 y.o. female with Pmhx notable for HTN, MetALD cirrhosis, severe obseity, and persistent a fib who presented to [facility] initially for encephalopathy following an aborted DCCV (LAA thrombus found). She subsequently had a complex hospital stay with multiple ICU admissions, PEA arrest, multiple intubations and eventually tracheostomy. Currently weaning trach, and providing NGT feeds, but mental status continues to be poor    Episode of tachycardia and tachypena:  Suspect 2/2 pain and/or anxiety as rapidly improved with one time dose of oxycodone . Seems to be trying to reach for trach more. Also treating cellulitis as below  - PRN tylenol , okay for one time doses of oxycodone  5mg      Cellulitis RLE:  Worsening erythema and edema despite overall improvement in anasarca. Nearly febrile overnight 12/4, and off abx x2 days, so c/f cellulitis flaring in that setting  - Blood cultures collected  - Start Cefazolin , can broaden if becomes systemically ill appearing    Acute hypoxemic and hypercarbic respiratory failure  Trach/vent depedent  Apneic events  D/w RT and weaning team. Plan to decrease pressure support very slowly, as she has become very hypercarbic in the past with a faster wean. Only on 25% FiO2, encephalopathy seems to be driving issues at this point given apneic events, and now back on rate   - Routine trach care per RT  - Appreciate assistance with vent weaning  - VBG QAM since we are adjusting vent settings.     Hypotension:  Recurrent issue, had septic shock earlier in hospitalization. Seems to be sensitive to diuresis  - Improved with albumin  12/2    Encephalopathy, multifactorial:  Focal non-convulsive seizures  Multiple drivers, prolonged ICU stays, cardiac arrest, metabolic derangements have been corrected, likely some hepatic encephalopathy. Did have subclinical seizures earlier in her course as well. Cefepime  also possible contributing. Avoiding other centrally acting medications   - Continue Lactulose , only hold if >1060ml stool via FMS  - Ongoing re-evalaution especially with family at bedside as able  - Discussed with neuro, while MRI improved, and could have been 2/2 prolonged subclinical seizures, or PRES, however given lack of improvement in exam findings, toxic metabolic insults also likely contributed, and it is difficult to predict how much and within what time frame she will improve  - Continue Levetiracetam  750mg  BID and Lacosamide  100mg  BID    Anasarca:  RLE erythema/edema  2/2 prolonged critical illness, underlying cirrhosis, and hypoalbuminemia. RLE is worse than other limbs (though R side overall is more edematous).   - Check for RLE DVT  - If fevers, would start Cefazolin  as empiric cellulitis coverage   - Lasix  40mg  spot dosing with close monitoring of BP, and BMP BID, currently not tolerating 2/2 hypotension    VAP 2/2 PsA and MSSA  S.p 7 day course 12/3    Persistent A fib  LAA thrombus:  Rate controlled  - Metop 25mg  BID  - Apixaban  5mg  BID     Decompensated MetALD cirrhosis   - Volume: anasarca as above, multiple POCUS evals without ascites  - Infection: no e/o SBP, PNA treated as above  - Bleeding: had MWT earlier in course, continue PPI BID, no e/o varices  - Encephalopathy: Rifaxamin and Lactulose  to goal of stool output via FMS    Severe protein calorie malnutrition  Hypernatremia, resolved  - Continue feeds via NGT, depending on GOC, and LTAC abilities, may need to pursue PEG.  Alerted her daughter to this, will revisit over the weekend and plan for PEG Monday if that is the road we go down.   - FWF q2h     Chronic diastolic heart failure (HFimpEF)  Not an active issue; edema unlikely cardiogenic in nature  -- strict intake / output     Right internal jugular / carotid fistula, resolved  Apparently thrombosed; no flow on carotid doppler 03/26/2024  -- discuss with vascular surgery whether any follow-up needed before discharge    GOC:  Multiple prior ACP discussions. Appreciate palliative care input. Goal is for functional recovery, unfortunately mental status appears to be very waxing/waning and has been worse the last several days  - S/p family meeting 12/4 over the phone, provided updates, daughter still wants to give her some time, is interested in hearing neurology's opinion on MRI and prognosis for recovery     Advanced care planning  - Code status: Full  - Healthcare proxy: daughter, [name]  - Disposition: LTACH, likely to need PEG but daughter still deciding   PT/OT recommendations: 5xlow     FEN/GI/PPX  - Diet: Nutren 2.0 continuous TF   - IVF: None  - Bowel regimen: lactulose   - DVT ppx: On therapeutic AC    I personally spent 50 minutes face-to-face and non-face-to-face in the care of this patient, which includes all pre, intra, and post visit time on the date of service.  All documented time was specific to the E/M visit and does not include any procedures that may have been performed.    Therisa Deanne Franco, MD  Assistant Professor of Medicine    ___________________________________________________________________    Subjective:  Seems more uncomfortable this AM. This afternoon, had tachycardia, was resisting me moving her arm more, grimacing more. Increased RR, all improved after pain medication. Still on rate with vent. ~1352ml stool output yesterday    Labs/Studies:  Labs and Studies from the last 24hrs per EMR and Reviewed    Objective:  Temp:  [37.1 ??C (98.8 ??F)-37.9 ??C (100.3 ??F)] 37.7 ??C (99.8 ??F)  Pulse:  [92-125] 97  SpO2 Pulse:  [93-124] 99  Resp:  [14-26] 21  BP: (100-161)/(62-93) 100/62  FiO2 (%):  [25 %] 25 %  SpO2:  [93 %-99 %] 98 %    Gen: lying in bed, grimaces to pain  HEENT: Dry MM, NGT in place with feeds running, trach in place, on rate on vent, PERRL, resists eye opening, no abnormal eye movements  CV: irregular, S1/S2, no MRG  Pulm: CTAB, no WRR  Abd: soft, non-tender, normal BS  Ext: diffuse anasarca improved except for in R leg which remains extremely edematous and is becoming more erythematous as well, though no warmth. LUE PICC in place   Neuro: unresponsive except to grimace to painful stimuli, moves arms and legs spontaneously and to painful stimuli, resists extension of arms today

## 2024-05-06 NOTE — Consults (Signed)
 Palliative Care Progress Note        Consultation from Requesting Attending Physician:  Therisa Deanne Franco, MD  Primary Care Provider:  Alyse Slater Pao, MD      Assessment/Plan:      SUMMARY:  This 78 y.o. 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      Symptom Assessment and Recommendations:      #Dyspnea 2/2 Acute hypoxemic and hypercarbic respiratory failure: Ongoing, currently trach/vent depedent, now on PRVC  - Vent wean per ROAD team    #Anasarca: Slightly improved, but ongoing, particularly in LEs. 2/2 prolonged critical illness, underlying cirrhosis, and hypoalbuminemia  - Agree with ongoing diuresis as able  - Cont TFs via NGT - pending GOC, will need to consider PEG  - Would benefit from elevating legs during periods of day if able to tolerate    #Acute on Chronic Encephalopathy iso MetALD: Persistent. Multi-factorial in setting of cirrhosis, infection, respiratory failure and baseline microvascular changes in brain.   - Continue lactulose  - very important to family that she continue to have sufficient BMs to limit - currently meeting that threshold with >1L stool output per day  - Cont delirium precautions    Goals of care / ACP:  Code Status:   Code Status: Full Code   Healthcare decision-maker if lacks capacity:   HCDM (patient stated preference): Lubbers,[name]    HCDM (patient stated preference): Davies,[name] - Daughter - [phone]     Update as of 12/4:   - Telephone conference with [name], primary team, and palliative.  MRI results shared with [name], noting near resolution of prior diffuse cortical restricted diffusion. Despite this, patinet has not shown any improvement in her neurological exam. We further shared that MRI also showed evidence of chronic microvascular ischemic disease, global parenchymal volume loss, and remote ischemic infarct - all suggestive of more fragile brain at baseline. Primary team also shared update that patinet has been having apneic spells while on the ventilator and has therefore vent settings have been adjsuted to provide more support. [name] shared that when she was with her mom on Sunday (11/30) it seemed that she was more alert and able to blink on command, but then on Tuesday (12/2) was not able to do so.   GLENWOOD [name] received the above news and understands that medical teams remain worried that we haven't seen improvement in her neuro exam or her respiratory status. [name] would like to see her mom in person again (likely tomorrow) before making any further decisions. We affirmed that and noted there is not urgency to make a quick decision. The next decisions we would face would be whether to place a PEG and then whether to refer to LTAC. [name] affirmed prior wishes that they want to allow patient to have time to recover if it is possible, but if it becomes more clear that functional recovery is not possible or very unlikely, then they may take a different approach.   GLENWOOD [name] also shared that her father enrolled in home hospice yesterday, which has certainly added to the stress of what is going on with her mom's health.   - Plan to regroup early next week  after [name] has had a chance to visit with her mother      Goals of care as discussed on 11/18:  - [name] shares this has been a tough week as her mom has gotten sicker, and her dad has made it out of rehab and is living with her, requiring a lot of care and supervision.  She is continuing to work as well and care for her children.  - She has heard that her mom's mental status has worsened and she has not been waking up.  She has heard there is concern about her ammonia levels, and potential seizures.  - She is worried, and at the same time holding out hope still that her mom could improve from this  - She is amenable to a family meeting with PC and ICU teams, although unsure of timing due to her work schedule and other responsibilities    Goals of care as discussed on 11/12:  - Please see ACP note from same day for further details.  - In brief, presented family with options to proceed with tracheostomy versus extubation without a plan for reintubation if she were to fail (plan at that time would be to transition to comfort focused care and allow natural death)  - Family affirmed Xzandria's ultimate goals would be to eventually get back home and lead a functional life.  They believe she would be willing to go through a prolonged hospitalization, tracheostomy and rehab if there is some hope of reaching that goal.  ICU team shared that they thought while the road ahead would be long and difficult, but this was definitely possible and that recent indicators have showed that Yexalen is slowly improving.  - Patient and family ultimately decided to proceed with tracheostomy    Goals of care as discussed on 11/10:  - Kinslei's family shares this has been a long and difficult hospitalization, with lots of unexpected decompensations  - They share that before this hospitalization, Marlow was doing well at home.  She was independent.  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  This is a MAJOR change for her.  - The ICU team shares that in the past, there have been questions from the family about if they would offer extubation and reintubation if indicated for a 5th time (as an alternative to proceeding straight to tracheostomy).  They would likely not offer this due to substantial risks without much benefit.    Practical, Emotional, Spiritual Support Recommendations:  Patient's daughters would like the patient to be included in decision making as much as possible    Recommendations discussed with primary team via Epic chat    Thank you for this consult. Please page Palliative Care if there are any questions.  Palliative Care team will continue to follow.    Subjective:     Recent Events:    NAEON. Was able to open eyes to voice today, but not able to follow any commands. Ongoing anasarca throughout. Having good stool output.     Objective:       Function:  30% - Ambulation: Totally Bed Bound / Unable to do any work, extensive disease / Self-Care: Total care / Intake: Reduced / Level of Conscious: Full, drowsy, or confusion    Temp:  [36.1 ??C (97 ??F)-37.9 ??C (100.3 ??F)] 37.2 ??C (98.9 ??F)  Pulse:  [95-125] 110  SpO2 Pulse:  [96-124] 105  Resp:  [14-24] 23  BP: (109-145)/(74-93) 132/76  FiO2 (%):  [  25 %] 25 %  SpO2:  [93 %-99 %] 97 %    Physical Exam:  General:  Ill appearing woman in NAD  HEENT:  s/p trach, connected to vent  Cardiovascular:  No evidence of cyanosis, RRR  Pulmonary:  Rhonchorous breath sounds on ventilator  Gastrointestinal: Soft, mildly distended  Ext:  Persistent pitting edema bilateral LE, also present in UE  Neuro: Opens eyes to voice today, not tracking, not able to follow any simple commands, moving UEs spontaneously, occasional toe wiggle    Testing reviewed and interpreted:  Reviewed and interpreted test results for cbc with anemia; bmp with nl renal function, low albumin  affecting assessment of underlying illness severity and prognosis    I personally spent 25 minutes face-to-face and non-face-to-face in the care of this patient, which includes all pre, intra, and post visit time on the date of service.  All documented time was specific to the E/M visit and does not include any procedures that may have been performed.     See ACP Note from today for additional billable service: No    Alyce Gola, MD  Hospice/Palliative Care

## 2024-05-06 NOTE — Consults (Signed)
 Follow-Up Consult Note        Requesting Attending Physician:  Therisa Deanne Franco, MD  Service Requesting Consult: [facility])     Assessment and Plan     [name] is a 78 y.o. female pmhx HFpEF, afib, and MASLD cirrhosis admitted with respiratory failure, hepatic encephalopathy, and septic shock course with course c/b hypernatremia on whom I have been asked by Therisa Deanne Franco, MD to consult for c/f seizure, re-consulted on 12/4 for neuro-prognostication.     # Non-convulsive Seizures- resolved  # Diffuse Cortical Restriction improved on repeat MRI brain   # Neur-prognostication   Please see details of patient's presentation and neurological evaluation please see consult note from 12/4.  Neurology has been asked to comment on patient's repeated MRI brain, which shows resolution of prior diffuse cortical restriction of the bilateral parieto-occipital regions.  Based on significant improvement with imaging, we are presuming thyroid etiology of this findings was likely prolonged seizure activity (correlates with the prolonged subclinical seizures arising from posterior head region). PRES is also still on differential, which is a reversible process and could also explain patient's MRI findings.  Given though imaging has improved, patient's neurological exam still remains poor, likely this is combination from multiple factors including toxic metabolic insults and hepatic encephalopathy.  It is very difficult to make different prognosis at this point, but given positive sign of improved imaging, it is reasonable to give patient more time if within goals of care.  If patient were to improve, it is unclear to what degree she would have improvement.    Recommendations:  Continue Keppra  1g BID and Vimpat  100 mg BID  (renally dose medications per pharmacy)  continued lactulose /BM per primary team    Patient was seen and discussed with Dr. Vicci who agrees with assessment plan above.  Will sign off at this time, for further questions or concerns please page neurology consult pager.     Rennis Peed, MD, PGY-4  Department of Neurology        HPI        Reason for Consult: c/f seizure    [name] is a 78 y.o. female on whom I have been asked by Therisa Deanne Franco, MD to consult for c/f seizure.  Interval History 12/5:   - Remains stable without significant exam changes       Interval History 12/4:  - Patient with minimal improvement   - Resolution of diffusion restriction on MRI     Interval History 11/24:  - Exam stable from prior  - cvEEG re-hooked 13:18. Discussed with Epilepsy faculty regarding captured data up to 19:14, demonstrating bilateral LPDs without electrographic seizure. EEG discontinued.    Interval History 11/23:  - Patient has been stable, but no improvements in exam since 11/20.     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 Also demonstrated moderate canal stenosis at C4-C5, moderate/severe canal stenosis at C6-C7, as well as severe neuroforaminal stenosis at bilateral C5-C6 and left C6-C7.   - Exam today improved from prior, patient with midline gaze, somewhat tracking/regarding, not following commands, but grimaces and semi-localizes to noxious in BUE, and weak w/d to nox in BLE.     Interval History 11/18:   - Overnight was found to have  EEG findings c/w IIC (LPDs+F), prompting initiation of LEV+LCS with some transient improvement  - MRI Brain w/wo was obtained, demosntrating diffuse bilateral parieto-occipital restricted diffusion. Case discussed on Neuroradiology case conference, felt to be most compatible with toxic-metabolic primary etiology.   - Re-reviewed overall history given complicated course, as detailed more extensively in A&P  - Exam now with midline gaze and intact OCRs, overall improved from prior    Initial HPI:  78 yo PMHx HFpEF, afib, and MASLD cirrhosis (MELD 15) admitted for respiratory failure, encephalopathy, and septic shock course with c/b hypernatremia.     At around 1930 on 11/17 was noted to have roving horizontal eye movements. Movements were nonrhythmic but persisted for several hours raising primary teams concerns for possible seizure. They gave 4mg  versed  and per their report movements only slowed down for a brief duration.     No reported facial twitching, limb shaking, fixed lateralized gaze, urinary incontinence, or tongue injury. Per chart review, no past history of seizures.     Per nursing report, patient has gradually become less and less responsive over the last 3 days. She was following commands and moving extremities (squeeze hands, wiggle toes) on 11/14, but now is unresponsive and has no spontaneous movement.     Lactulose  was held for intermittent periods starting 11/12 and was discontinued on 11/15 due to increased bowel movement frequency (per chart review had 5 BMs on 11/15). Since worsened mentation on 11/16,  lactulose  was resumed.   Ammonia 291 on 11/16, previously 160 on 10/24    Earlier in ICU stay had E. Coli bacteremia -Finished course of ceftriaxone .     On a CTA abdomen in October 2025 was noted to have a chronic T12 compression fracture.   On a CT cervical spine in May 2024 was noted to have questionable lucency involving the left lateral aspect of C2 vertebral body.     Has known history of osteoporosis identified on Dexa scan of femoral neck (11/2022 T score -2.9).     Allergies[1]     Current Medications[2] Prescriptions Prior to Admission[3]    Past Medical History[4]    Past Surgical History[5]    Social History[6]    Family History[7]    Code Status: Full Code     Review of Systems     Unable to obtain due to patient's mental status.       Objective        Temp:  [36.7 ??C (98.1 ??F)-37.9 ??C (100.3 ??F)] 37.1 ??C (98.8 ??F)  Pulse:  [101-125] 119  SpO2 Pulse:  [96-124] 122  Resp:  [14-26] 26  BP: (113-161)/(74-93) 120/87  MAP (mmHg):  [86-108] 95  FiO2 (%):  [25 %] 25 %  SpO2:  [93 %-99 %] 97 %  I/O this shift:  In: 820 [NG/GT:820]  Out: 450 [Urine:150; Stool:300]      Physical Exam:  General Exam:  General: Lying in bed. No obvious distress.   ENT:  Mucous membranes moist. Oropharynx clear.Has dried blood on lips   Cardiovascular:  Regular rate and rhythm.   Respiratory: trach in place.  Gastrointestinal: soft  Extremities: Edema in upper extremities bilateral.   Edema in lower extremities bilateral.    Skin: significant edema throughout, mostly pronounce on RLE c/f cellulitis, has significant erythema     Neurological Exam:  Mental Status  LOC: arouses to voice  Orientation: UTA  Speech: UTA  Briefly tracks/regards  Does not follow commands.     Cranial  Nerves  Pupils: PERRL 3mm   Corneals: present  Gaze: Midline gaze, no preference or forced gaze, spontaneous movements at timed regards.   Face Motor: normal and symmetric. Patient has significant resistance when examiner tries to passively her eyes, strong eye closure   Oculocephalic: present  Cough: strong, spontaneous   Hjh:Izqzmmzi, previously: strong    Motor:   Normal tone   Globally decreased muscle bulk   Spontaneous movements 3/5 in BUE   More brisk w/d in RLE compared to left   No purposeful movements observed     Previously:   RUE: Semi-localizes (reaches towards source of noxious) Previously: no response  LUE: Semi-localizes (reaches towards source of nox, crosses midline) Previously:  no response  RLE: Weak withdrawal to nox. Previously: brisk toe and foot extension to light touch over the dorsum of the foot and triple flexion to noxious  LLE: Weak withdrawal to nox. Previously:  toe and foot extension to light touch over the dorsum of the foot and triple flexion to noxious     Sensory:  Response to distal noxious as above     Reflexes:  Deferred given significant edema and cellulitis, previously:  Reflexes Right Left   Biceps  C5 3+ 3+   Brachioradialis C6  3+ 3+   Triceps C7 3+ 3+   Patella L3 (4) N/a N/a   Achilles S1 (S2) >4+ beats clonus >4+ beats clonus   Plantar Response Extensor Extensor   Jaw Jerk CN V Absent   Pectoralis C5-T1 Present Present   Hoffman Present Absent   Cross Adductors L2-4 Absent Absent        Diagnostic Studies      All Labs Last 24hrs:   Recent Results (from the past 24 hours)   Basic Metabolic Panel    Collection Time: 05/06/24  5:19 AM   Result Value Ref Range    Sodium 143 135 - 145 mmol/L    Potassium 3.8 3.4 - 4.8 mmol/L    Chloride 105 98 - 107 mmol/L    CO2 26.0 20.0 - 31.0 mmol/L    Anion Gap 12 5 - 14 mmol/L    BUN 15 9 - 23 mg/dL    Creatinine 9.64 (L) 0.55 - 1.02 mg/dL    BUN/Creatinine Ratio 43     eGFR CKD-EPI (2021) Female >90 >=60 mL/min/1.84m2    Glucose 143 70 - 179 mg/dL    Calcium  8.1 (L) 8.7 - 10.4 mg/dL   Blood Gas, Venous    Collection Time: 05/06/24  5:19 AM   Result Value Ref Range    Specimen Source Venous     FIO2 Venous Not Specified     pH, Venous 7.47 (H) 7.32 - 7.43    pCO2, Ven 38 (L) 40 - 60 mm Hg    pO2, Ven 52 30 - 55 mm Hg    HCO3, Ven 27 22 - 27 mmol/L    Base Excess, Ven 3.7 (H) -2.0 - 2.0    O2 Saturation, Venous 87.8 (H) 40.0 - 85.0 %    Carboxyhemoglobin, Venous 2.1 (H) <1.2 %    Methemoglobin, Venous <1.0 <1.5 %    Oxyhemoglobin Venous 85.6 (H) 40.0 - 85.0 %   Hepatic Function Panel    Collection Time: 05/06/24  5:19 AM   Result Value Ref Range    Albumin  2.1 (L) 3.4 - 5.0 g/dL    Total Protein 5.5 (L) 5.7 - 8.2 g/dL    Total Bilirubin 0.6 0.3 -  1.2 mg/dL    Bilirubin, Direct 9.69 0.00 - 0.30 mg/dL    AST 54 (H) <=65 U/L    ALT 18 10 - 49 U/L    Alkaline Phosphatase 215 (H) 46 - 116 U/L   Magnesium  Level    Collection Time: 05/06/24  5:19 AM   Result Value Ref Range    Magnesium  2.0 1.6 - 2.6 mg/dL   Phosphorus Level    Collection Time: 05/06/24  5:19 AM   Result Value Ref Range    Phosphorus 2.5 2.4 - 5.1 mg/dL   CBC w/ Differential    Collection Time: 05/06/24  5:19 AM   Result Value Ref Range    WBC 6.1 3.6 - 11.2 10*9/L    RBC 2.74 (L) 3.95 - 5.13 10*12/L    HGB 8.2 (L) 11.3 - 14.9 g/dL    HCT 74.2 (L) 65.9 - 44.0 %    MCV 93.6 77.6 - 95.7 fL    MCH 29.9 25.9 - 32.4 pg    MCHC 31.9 (L) 32.0 - 36.0 g/dL    RDW 81.7 (H) 87.7 - 15.2 %    MPV 9.0 6.8 - 10.7 fL    Platelet 169 150 - 450 10*9/L    Neutrophils % 68.2 %    Lymphocytes % 17.8 %    Monocytes % 8.5 %    Eosinophils % 4.5 %    Basophils % 1.0 %    Absolute Neutrophils 4.2 1.8 - 7.8 10*9/L    Absolute Lymphocytes 1.1 1.1 - 3.6 10*9/L    Absolute Monocytes 0.5 0.3 - 0.8 10*9/L    Absolute Eosinophils 0.3 0.0 - 0.5 10*9/L    Absolute Basophils 0.1 0.0 - 0.1 10*9/L    Anisocytosis Slight (A) Not Present     MRI brain w.wo 12/4   Resolution of previously seen diffuse cortical restricted diffusion with mild residual parietal occipital cortical signal abnormalities as described. No new area of restricted diffusion and no abnormal enhancement.      Unchanged findings of chronic microvascular ischemic disease.       CT head 11/17   No acute abnormalities on personal review    MRI Brain W Wo Contrast, 04/03/24  Impression  Multiple sequences are degraded due to motion which limits evaluation.  Within the limitations no acute intracranial abnormality.  Severe periventricular and deep white matter T2/FLAIR hyperintense signal compatible with small vessel ischemic changes. Multiple microhemorrhages as described. Consider cognitive assessment.  Remote left cerebellar infarct.  Small right mastoid effusion.    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      Moderate to severe central canal stenosis at C6-7 and moderate canal stenosis at C4-C5 secondary to disc osteophyte complex and ligament flavum hypertrophy.      Severe neural foraminal stenosis bilaterally at C5-6. Severe left-sided neural foraminal stenosis at C6-7.                    Attending Physician Attestation:  I saw the patient with the Resident. I discussed the findings, assessment, and plan with the Resident and agree with the findings and plan as documented in the Resident's note.     Geni CHARM Louder, MD, MS  Clinical Assistant Professor  [facility] Department of Neurology             [1]  Allergies  Allergen Reactions    Rocuronium  Anaphylaxis     Suspected intraoperative anaphylaxis 04/15/2024. See anesthetic from tracheostomy. Tryptase pending at time of listing.     Sulfa (Sulfonamide Antibiotics) Anaphylaxis    Sulfur  Anaphylaxis     Tolerated sulfur  colloid injection without incident 09/03/2020.    Aspirin      thrombocytopenia   [2]   Current Facility-Administered Medications   Medication Dose Route Frequency Provider Last Rate Last Admin    acetaminophen  (TYLENOL ) tablet 650 mg  650 mg Oral Q4H PRN Joesph Prentice SQUIBB, MD   650 mg at 05/06/24 1417    apixaban  (ELIQUIS ) tablet 5 mg  5 mg Enteral tube: gastric BID Joesph Prentice SQUIBB, MD   5 mg at 05/06/24 9178    ceFAZolin  (ANCEF ) IVPB 1 g in 50 mL dextrose  (premix)  1 g Intravenous Q8H Symmes, Therisa Deans, MD   Stopped at 05/06/24 1210    cyanocobalamin  (vitamin B-12) tablet 1,000 mcg  1,000 mcg Enteral tube: gastric Daily Joesph Prentice SQUIBB, MD   1,000 mcg at 05/06/24 9178    ergocalciferol  (DRISDOL ) oral drops 200 mcg/mL (8,000 unit/mL)  100 mcg Enteral tube: gastric Daily Joesph Prentice SQUIBB, MD   100 mcg at 05/06/24 9177    esomeprazole  (NEXIUM ) granules 40 mg  40 mg Enteral tube: gastric Daily Joesph Prentice SQUIBB, MD   40 mg at 05/06/24 9177    famotidine  (PEPCID ) tablet 20 mg  20 mg Enteral tube: gastric Daily Joesph Prentice SQUIBB, MD   20 mg at 05/06/24 9178    flu vacc 2025-26 (65 yr up) (PF)(FLUAD)45 mcg(43mcgx3)/0.5 ml IM syringe  0.5 mL Intramuscular During hospitalization Joesph Prentice SQUIBB, MD        folic acid  (FOLVITE ) tablet 1 mg  1 mg Enteral tube: gastric Daily Joesph Prentice SQUIBB, MD   1 mg at 05/06/24 9178    lacosamide  (VIMPAT ) tablet 100 mg  100 mg Enteral tube: gastric BID Joesph Prentice SQUIBB, MD   100 mg at 05/06/24 9178    lactulose  oral solution  30 g Enteral tube: gastric TID Symmes, Anna Gravier, MD   30 g at 05/06/24 9177    levETIRAcetam  (KEPPRA ) tablet 750 mg  750 mg Enteral tube: gastric BID Joesph Prentice SQUIBB, MD   750 mg at 05/06/24 9178    melatonin tablet 3 mg  3 mg Enteral tube: gastric QPM Joesph Prentice SQUIBB, MD   3 mg at 05/05/24 8190    metoPROLOL  tartrate (Lopressor ) tablet 25 mg  25 mg Enteral tube: gastric BID Tukov-Yual, Magdalene Shuser, ANP   25 mg at 05/06/24 0822    multivitamins, therapeutic with minerals tablet 1 tablet  1 tablet Enteral tube: gastric Daily Joesph Prentice SQUIBB, MD   1 tablet at 05/06/24 9178    rifAXIMin  (XIFAXAN ) oral suspension  550 mg Enteral tube: gastric BID Joesph Prentice SQUIBB, MD   550 mg at 05/06/24 9177    sodium chloride  (NS) 0.9 % flush 10 mL  10 mL Intravenous Q8H Joesph Prentice SQUIBB, MD   10 mL at 05/06/24 1110    sodium chloride  (NS) 0.9 % flush 10 mL  10 mL Intravenous Q8H Joesph Prentice SQUIBB, MD   10 mL at 05/06/24 1110    thiamine  mononitrate (vit B1) tablet 100 mg  100 mg Enteral tube: gastric Daily Joesph Prentice SQUIBB, MD   100 mg at 05/06/24 9178   [3]   Medications Prior to Admission   Medication Sig Dispense Refill  Last Dose/Taking    anastrozole  (ARIMIDEX ) 1 mg tablet Take 1 tablet (1 mg total) by mouth daily. 90 tablet 3 03/10/2024 at  6:00 AM    carvedilol  (COREG ) 3.125 MG tablet Take 1 tablet (3.125 mg total) by mouth two (2) times a day. 180 tablet 2 03/10/2024 Morning    folic acid  (FOLVITE ) 1 MG tablet Take 1 tablet (1 mg total) by mouth daily.   03/10/2024 at  6:00 AM    furosemide  (LASIX ) 40 MG tablet Take 1 tablet (40 mg total) by mouth daily as needed. 30 tablet 11 03/10/2024 at  6:00 AM    magnesium  oxide (MAG-OX) 400 mg (241.3 mg elemental magnesium ) tablet Take 1 tablet (400 mg total) by mouth two (2) times a day. 180 tablet 3 03/10/2024 at  6:00 AM    potassium chloride  20 MEQ ER tablet Take 1 tablet (20 mEq total) by mouth two (2) times a day. 60 tablet 11 03/09/2024 at  6:00 AM    spironolactone  (ALDACTONE ) 25 MG tablet Take 1 tablet (25 mg total) by mouth daily. 90 tablet 1 03/10/2024 at  6:00 AM    thiamine  (B-1) 100 MG tablet Take 1 tablet (100 mg total) by mouth daily.   03/10/2024 Morning    vitamin E-268 mg, 400 UNIT, 268 mg (400 UNIT) capsule Take 800 mg by mouth in the morning. 2 tabs.   03/10/2024 Morning    acetaminophen  (TYLENOL ) 325 MG tablet Take 2 tablets (650 mg total) by mouth every six (6) hours as needed for pain.  0     [Paused] alendronate  (FOSAMAX ) 70 MG tablet Take 1 tablet (70 mg total) by mouth every seven (7) days. (Patient not taking: Reported on 03/07/2024) 12 tablet 3     calcium  carbonate (OS-CAL) 1,250 mg (500 mg elem calcium ) tablet Take 1 tablet (500 mg elem calcium  total) by mouth in the morning. (Patient not taking: Reported on 03/07/2024)       cyanocobalamin  1000 MCG tablet Take 1 tablet (1,000 mcg total) by mouth daily.       [EXPIRED] nystatin  (MYCOSTATIN ) 100,000 unit/gram powder Apply to affected area 3 times daily 15 g 0    [4]   Past Medical History:  Diagnosis Date    A-fib (CMS-HCC)     Alcoholism    (CMS-HCC)     Alcoholism /alcohol abuse     Cirrhosis    (CMS-HCC)     Depression     Hypertension     Liver disease     Malignant neoplasm of overlapping sites of right breast in female, estrogen receptor positive    (CMS-HCC) 10/03/2020   [5]   Past Surgical History:  Procedure Laterality Date    BREAST BIOPSY Right     benign-a long time ago    BREAST BIOPSY Right 07/2020    malignant    BREAST LUMPECTOMY Right     4 2022    CENTRAL LINE  03/25/2024    CHOLECYSTECTOMY      PR BX/REMV,LYMPH NODE,DEEP AXILL Right 09/03/2020    Procedure: BX/EXC LYMPH NODE; OPEN, DEEP AXILRY NODE;  Surgeon: Alm Elsie Como, MD;  Location: [facility];  Service: Surgical Oncology Breast    PR CARDIOVERSION, ELECTIVE;EXTERN N/A 03/10/2024    Procedure: CARDIOVERSION, ELECTIVE, ELECTRICAL CONVERSION OF ARRHYTHMIA; EXTERNAL;  Surgeon: Sedalia Velma Hamilton, MD;  Location: [facility] OR [facility];  Service: Cardiology    PR ECHO HEART,TRANSESOPHAGEAL,COMPLETE Midline 03/10/2024    Procedure: ECHOCARDIOGRAPHY, TRANSESOPHAGEAL, REAL-TIME  WITH IMAGE DOCUMENTATION;  Surgeon: Sedalia Velma Hamilton, MD;  Location: [facility] OR [facility];  Service: Cardiology    PR INTRAOPERATIVE SENTINEL LYMPH NODE ID W DYE INJECTION Right 09/03/2020    Procedure: INTRAOPERATIVE IDENTIFICATION SENTINEL LYMPH NODE(S) INCLUDE INJECTION NON-RADIOACTIVE DYE, WHEN PERFORMED;  Surgeon: Alm Elsie Como, MD;  Location: [facility];  Service: Surgical Oncology Breast    PR MASTECTOMY, PARTIAL Right 09/03/2020    Procedure: MASTECTOMY, PARTIAL (EG, LUMPECTOMY, TYLECTOMY, QUADRANTECTOMY, SEGMENTECTOMY);  Surgeon: Alm Elsie Como, MD;  Location: [facility] OR [facility];  Service: Surgical Oncology Breast    PR TRACHEOSTOMY, PLANNED N/A 04/15/2024    Procedure: TRACHEOSTOMY PLANNED (SEPART PROC);  Surgeon: Pixie Loader, MD;  Location: OR [facility];  Service: ENT    PR UPPER GI ENDOSCOPY,BIOPSY N/A 02/03/2018    Procedure: UGI ENDOSCOPY; WITH BIOPSY, SINGLE OR MULTIPLE;  Surgeon: Eleanor Dewey Sorrel, MD;  Location: [facility];  Service: Gastroenterology    RADIATION Right     unsure finish date    TUBAL LIGATION     [6]   Social History  Socioeconomic History    Marital status: Married     Spouse name: None    Number of children: None    Years of education: None    Highest education level: None   Tobacco Use    Smoking status: Never     Passive exposure: Past    Smokeless tobacco: Never   Vaping Use    Vaping status: Never Used   Substance and Sexual Activity    Alcohol use: Not Currently    Drug use: Never    Sexual activity: Not Currently   Social History Narrative Daughter fills med boxes weekly.      Social Drivers of Health     Food Insecurity: No Food Insecurity (03/02/2024)    Hunger Vital Sign     Worried About Running Out of Food in the Last Year: Never true     Ran Out of Food in the Last Year: Never true   Tobacco Use: Low Risk (04/25/2024)    Patient History     Smoking Tobacco Use: Never     Smokeless Tobacco Use: Never     Passive Exposure: Past   Transportation Needs: No Transportation Needs (03/02/2024)    PRAPARE - Transportation     Lack of Transportation (Medical): No     Lack of Transportation (Non-Medical): No   Alcohol Use: Not At Risk (04/20/2023)    Alcohol Use     How often do you have a drink containing alcohol?: Never     How many drinks containing alcohol do you have on a typical day when you are drinking?: 1 - 2     How often do you have 5 or more drinks on one occasion?: Never   Housing: Low Risk (03/02/2024)    Housing     Within the past 12 months, have you ever stayed: outside, in a car, in a tent, in an overnight shelter, or temporarily in someone else's home (i.e. couch-surfing)?: No     Are you worried about losing your housing?: No   Physical Activity: Inactive (04/20/2023)    Exercise Vital Sign     Days of Exercise per Week: 0 days     Minutes of Exercise per Session: 0 min   Utilities: Low Risk (04/20/2023)    Utilities     Within the past 12 months, have you been unable to get utilities (heat,  electricity) when it was really needed?: No   Stress: No Stress Concern Present (04/20/2023)    [employer] of Occupational Health - Occupational Stress Questionnaire     Feeling of Stress : Only a little   Interpersonal Safety: Patient Unable To Answer (03/11/2024)    Interpersonal Safety     Unsafe Where You Currently Live: Patient unable to answer     Physically Hurt by Anyone: Patient unable to answer     Abused by Anyone: Patient unable to answer   Social Connections: Moderately Isolated (04/20/2023)    Social Connection and Isolation Panel Frequency of Communication with Friends and Family: More than three times a week     Frequency of Social Gatherings with Friends and Family: More than three times a week     Attends Religious Services: Never     Database Administrator or Organizations: No     Attends Engineer, Structural: Never     Marital Status: Married   Programmer, Applications: Low Risk (03/02/2024)    Overall Financial Resource Strain (CARDIA)     Difficulty of Paying Living Expenses: Not hard at all   Health Literacy: Low Risk (04/20/2023)    Health Literacy     : Never   Internet Connectivity: No Internet connectivity concern identified (04/20/2023)    Internet Connectivity     Do you have access to internet services: Yes     How do you connect to the internet: Personal Device at home     Is your internet connection strong enough for you to watch video on your device without major problems?: Yes     Do you have enough data to get through the month?: Yes     Does at least one of the devices have a camera that you can use for video chat?: Yes   [7]   Family History  Problem Relation Age of Onset    Heart disease Mother     No Known Problems Father     No Known Problems Sister     No Known Problems Daughter     No Known Problems Maternal Grandmother     No Known Problems Maternal Grandfather     No Known Problems Paternal Grandmother     No Known Problems Paternal Grandfather     Lupus Brother     No Known Problems Other     BRCA 1/2 Neg Hx     Breast cancer Neg Hx     Cancer Neg Hx     Colon cancer Neg Hx     Endometrial cancer Neg Hx     Ovarian cancer Neg Hx     Mental illness Neg Hx     Substance Abuse Disorder Neg Hx

## 2024-05-06 NOTE — Plan of Care (Signed)
 No changes made to ventilation. Trach care completed, airway remains patent, secure, and no breakdown noted. Emergency equipment at the bedside.    Problem: Mechanical Ventilation Invasive  Goal: Optimal Device Function  Intervention: Optimize Device Care and Function  Recent Flowsheet Documentation  Taken 05/06/2024 1421 by Heron Connors, RRT  Airway/Ventilation Management:   airway patency maintained   humidification applied   pulmonary hygiene promoted  Taken 05/06/2024 0836 by Heron Connors, RRT  Airway/Ventilation Management:   airway patency maintained   humidification applied   pulmonary hygiene promoted  Oral Care:   mouth swabbed   oral rinse provided   suction provided   teeth brushed   tongue brushed  Goal: Absence of Ventilator-Induced Lung Injury  Intervention: Prevent Ventilator-Associated Pneumonia  Recent Flowsheet Documentation  Taken 05/06/2024 1421 by Heron Connors, RRT  Head of Bed [facility]) Positioning: HOB at 30-45 degrees  VAP Prevention Bundle:   HOB elevation maintained   oral care regularly provided   vent circuit breaks minimized  Taken 05/06/2024 0836 by Heron, Mathhew Buysse, RRT  Head of Bed [facility]) Positioning: HOB at 30-45 degrees  VAP Prevention Bundle:   HOB elevation maintained   oral care regularly provided   vent circuit breaks minimized  Oral Care:   mouth swabbed   oral rinse provided   suction provided   teeth brushed   tongue brushed     Problem: Artificial Airway  Goal: Effective Communication  Outcome: Ongoing - Unchanged  Goal: Optimal Device Function  Outcome: Ongoing - Unchanged  Intervention: Optimize Device Care and Function  Recent Flowsheet Documentation  Taken 05/06/2024 1421 by Heron Connors, RRT  Airway/Ventilation Management:   airway patency maintained   humidification applied   pulmonary hygiene promoted  Taken 05/06/2024 0836 by Heron Connors, RRT  Airway/Ventilation Management:   airway patency maintained   humidification applied   pulmonary hygiene promoted  Oral Care:   mouth swabbed   oral rinse provided   suction provided   teeth brushed   tongue brushed  Goal: Absence of Device-Related Skin or Tissue Injury  Outcome: Ongoing - Unchanged

## 2024-05-07 LAB — CBC W/ AUTO DIFF
BASOPHILS ABSOLUTE COUNT: 0.1 10*9/L (ref 0.0–0.1)
BASOPHILS RELATIVE PERCENT: 0.6 %
EOSINOPHILS ABSOLUTE COUNT: 0.4 10*9/L (ref 0.0–0.5)
EOSINOPHILS RELATIVE PERCENT: 3.4 %
HEMATOCRIT: 25.2 % — ABNORMAL LOW (ref 34.0–44.0)
HEMOGLOBIN: 8 g/dL — ABNORMAL LOW (ref 11.3–14.9)
LYMPHOCYTES ABSOLUTE COUNT: 1.6 10*9/L (ref 1.1–3.6)
LYMPHOCYTES RELATIVE PERCENT: 13.8 %
MEAN CORPUSCULAR HEMOGLOBIN CONC: 31.9 g/dL — ABNORMAL LOW (ref 32.0–36.0)
MEAN CORPUSCULAR HEMOGLOBIN: 30 pg (ref 25.9–32.4)
MEAN CORPUSCULAR VOLUME: 94.1 fL (ref 77.6–95.7)
MEAN PLATELET VOLUME: 9 fL (ref 6.8–10.7)
MONOCYTES ABSOLUTE COUNT: 0.8 10*9/L (ref 0.3–0.8)
MONOCYTES RELATIVE PERCENT: 7 %
NEUTROPHILS ABSOLUTE COUNT: 8.5 10*9/L — ABNORMAL HIGH (ref 1.8–7.8)
NEUTROPHILS RELATIVE PERCENT: 75.2 %
PLATELET COUNT: 180 10*9/L (ref 150–450)
RED BLOOD CELL COUNT: 2.68 10*12/L — ABNORMAL LOW (ref 3.95–5.13)
RED CELL DISTRIBUTION WIDTH: 18.3 % — ABNORMAL HIGH (ref 12.2–15.2)
WBC ADJUSTED: 11.3 10*9/L — ABNORMAL HIGH (ref 3.6–11.2)

## 2024-05-07 LAB — BLOOD GAS, VENOUS
BASE EXCESS VENOUS: 4.2 — ABNORMAL HIGH (ref -2.0–2.0)
CARBOXYHEMOGLOBIN, VENOUS: 1.7 % — ABNORMAL HIGH (ref <1.2)
HCO3 VENOUS: 28 mmol/L — ABNORMAL HIGH (ref 22–27)
METHEMOGLOBIN, VENOUS: <1 % (ref <1.5)
O2 SATURATION VENOUS: 58.6 % (ref 40.0–85.0)
OXYHEMOGLOBIN, VENOUS: 57.3 % (ref 40.0–85.0)
PCO2 VENOUS: 41 mmHg (ref 40–60)
PH VENOUS: 7.45 — ABNORMAL HIGH (ref 7.32–7.43)
PO2 VENOUS: 34 mmHg (ref 30–55)

## 2024-05-07 LAB — BASIC METABOLIC PANEL
ANION GAP: 9 mmol/L (ref 5–14)
BLOOD UREA NITROGEN: 22 mg/dL (ref 9–23)
BUN / CREAT RATIO: 50
CALCIUM: 8.4 mg/dL — ABNORMAL LOW (ref 8.7–10.4)
CHLORIDE: 104 mmol/L (ref 98–107)
CO2: 29 mmol/L (ref 20.0–31.0)
CREATININE: 0.44 mg/dL — ABNORMAL LOW (ref 0.55–1.02)
EGFR CKD-EPI (2021) FEMALE: >90 mL/min/1.73m2 (ref >=60)
GLUCOSE RANDOM: 132 mg/dL (ref 70–179)
POTASSIUM: 3.8 mmol/L (ref 3.4–4.8)
SODIUM: 142 mmol/L (ref 135–145)

## 2024-05-07 LAB — LACTATE, VENOUS, WHOLE BLOOD: LACTATE BLOOD VENOUS: 1.7 mmol/L (ref 0.5–1.8)

## 2024-05-07 MED ADMIN — sodium chloride (NS) 0.9 % flush 10 mL: 10 mL | INTRAVENOUS | @ 08:00:00

## 2024-05-07 MED ADMIN — sodium chloride (NS) 0.9 % flush 10 mL: 10 mL | INTRAVENOUS | @ 15:00:00

## 2024-05-07 MED ADMIN — sodium chloride (NS) 0.9 % flush 10 mL: 10 mL | INTRAVENOUS | @ 01:00:00

## 2024-05-07 MED ADMIN — rifAXIMin (XIFAXAN) oral suspension: 550 mg | GASTROENTERAL | @ 15:00:00 | Stop: 2025-04-11

## 2024-05-07 MED ADMIN — rifAXIMin (XIFAXAN) oral suspension: 550 mg | GASTROENTERAL | @ 02:00:00 | Stop: 2025-04-11

## 2024-05-07 MED ADMIN — lactulose oral solution: 30 g | GASTROENTERAL | @ 18:00:00

## 2024-05-07 MED ADMIN — lactulose oral solution: 30 g | GASTROENTERAL | @ 15:00:00

## 2024-05-07 MED ADMIN — folic acid (FOLVITE) tablet 1 mg: 1 mg | GASTROENTERAL | @ 15:00:00

## 2024-05-07 MED ADMIN — thiamine mononitrate (vit B1) tablet 100 mg: 100 mg | GASTROENTERAL | @ 15:00:00

## 2024-05-07 MED ADMIN — ergocalciferol (DRISDOL) oral drops 200 mcg/mL (8,000 unit/mL): 100 ug | GASTROENTERAL | @ 15:00:00 | Stop: 2024-05-21

## 2024-05-07 MED ADMIN — cyanocobalamin (vitamin B-12) tablet 1,000 mcg: 1000 ug | GASTROENTERAL | @ 15:00:00

## 2024-05-07 MED ADMIN — levETIRAcetam (KEPPRA) tablet 750 mg: 750 mg | GASTROENTERAL | @ 15:00:00

## 2024-05-07 MED ADMIN — levETIRAcetam (KEPPRA) tablet 750 mg: 750 mg | GASTROENTERAL | @ 02:00:00

## 2024-05-07 MED ADMIN — famotidine (PEPCID) tablet 20 mg: 20 mg | GASTROENTERAL | @ 15:00:00

## 2024-05-07 MED ADMIN — melatonin tablet 3 mg: 3 mg | GASTROENTERAL | @ 02:00:00

## 2024-05-07 MED ADMIN — melatonin tablet 3 mg: 3 mg | GASTROENTERAL | @ 23:00:00

## 2024-05-07 MED ADMIN — oxyCODONE (ROXICODONE) immediate release tablet 5 mg: 5 mg | ORAL | @ 12:00:00 | Stop: 2024-05-07

## 2024-05-07 MED ADMIN — apixaban (ELIQUIS) tablet 5 mg: 5 mg | GASTROENTERAL | @ 02:00:00

## 2024-05-07 MED ADMIN — apixaban (ELIQUIS) tablet 5 mg: 5 mg | GASTROENTERAL | @ 15:00:00

## 2024-05-07 MED ADMIN — esomeprazole (NEXIUM) granules 40 mg: 40 mg | GASTROENTERAL | @ 15:00:00

## 2024-05-07 MED ADMIN — multivitamins, therapeutic with minerals tablet 1 tablet: 1 | GASTROENTERAL | @ 15:00:00

## 2024-05-07 MED ADMIN — metoPROLOL tartrate (Lopressor) tablet 25 mg: 25 mg | GASTROENTERAL | @ 02:00:00

## 2024-05-07 MED ADMIN — lacosamide (VIMPAT) tablet 100 mg: 100 mg | GASTROENTERAL | @ 15:00:00

## 2024-05-07 MED ADMIN — lacosamide (VIMPAT) tablet 100 mg: 100 mg | GASTROENTERAL | @ 02:00:00

## 2024-05-07 MED ADMIN — ceFAZolin (ANCEF) IVPB 1 g in 50 mL dextrose (premix): 1 g | INTRAVENOUS | @ 15:00:00 | Stop: 2024-05-13

## 2024-05-07 MED ADMIN — ceFAZolin (ANCEF) IVPB 1 g in 50 mL dextrose (premix): 1 g | INTRAVENOUS | @ 08:00:00 | Stop: 2024-05-13

## 2024-05-07 MED ADMIN — acetaminophen (TYLENOL) tablet 650 mg: 650 mg | ORAL | @ 23:00:00

## 2024-05-07 MED ADMIN — acetaminophen (TYLENOL) tablet 650 mg: 650 mg | ORAL | @ 18:00:00

## 2024-05-07 NOTE — Plan of Care (Signed)
 Problem: Skin Injury Risk Increased  Goal: Skin Health and Integrity  Intervention: Optimize Skin Protection  Recent Flowsheet Documentation  Taken 05/07/2024 0138 by Pearson Bolk, RRT  Head of Bed [facility]) Positioning: HOB at 30-45 degrees  Taken 05/06/2024 2044 by Pearson Bolk, RRT  Head of Bed [facility]) Positioning: HOB at 30-45 degrees     Problem: Breathing Pattern Ineffective  Goal: Effective Breathing Pattern  Intervention: Promote Improved Breathing Pattern  Recent Flowsheet Documentation  Taken 05/07/2024 0138 by Pearson Bolk, RRT  Head of Bed [facility]) Positioning: HOB at 30-45 degrees  Airway/Ventilation Management:   airway patency maintained   pulmonary hygiene promoted   humidification applied  Taken 05/06/2024 2044 by Pearson Bolk, RRT  Head of Bed [facility]) Positioning: HOB at 30-45 degrees  Airway/Ventilation Management:   airway patency maintained   humidification applied   pulmonary hygiene promoted     Problem: Gas Exchange Impaired  Goal: Optimal Gas Exchange  Intervention: Optimize Oxygenation and Ventilation  Recent Flowsheet Documentation  Taken 05/07/2024 0138 by Pearson Bolk, RRT  Head of Bed [facility]) Positioning: HOB at 30-45 degrees  Airway/Ventilation Management:   airway patency maintained   pulmonary hygiene promoted   humidification applied  Taken 05/06/2024 2044 by Pearson Bolk, RRT  Head of Bed [facility]) Positioning: HOB at 30-45 degrees  Airway/Ventilation Management:   airway patency maintained   humidification applied   pulmonary hygiene promoted     Problem: Wound  Goal: Skin Health and Integrity  Intervention: Optimize Skin Protection  Recent Flowsheet Documentation  Taken 05/07/2024 0138 by Pearson Bolk, RRT  Head of Bed [facility]) Positioning: HOB at 30-45 degrees  Taken 05/06/2024 2044 by Pearson Bolk, RRT  Head of Bed [facility]) Positioning: HOB at 30-45 degrees     Problem: Mechanical Ventilation Invasive  Goal: Optimal Device Function  Intervention: Optimize Device Care and Function  Recent Flowsheet Documentation  Taken 05/07/2024 0138 by Pearson Bolk, RRT  Airway/Ventilation Management:   airway patency maintained   pulmonary hygiene promoted   humidification applied  Taken 05/06/2024 2044 by Pearson Bolk, RRT  Airway/Ventilation Management:   airway patency maintained   humidification applied   pulmonary hygiene promoted  Oral Care:   suction provided   tongue brushed   mouth swabbed  Goal: Absence of Ventilator-Induced Lung Injury  Intervention: Prevent Ventilator-Associated Pneumonia  Recent Flowsheet Documentation  Taken 05/07/2024 0138 by Pearson Bolk, RRT  Head of Bed [facility]) Positioning: HOB at 30-45 degrees  VAP Prevention Bundle:   HOB elevation maintained   oral care regularly provided   vent circuit breaks minimized  Taken 05/06/2024 2044 by Pearson Bolk, RRT  Head of Bed [facility]) Positioning: HOB at 30-45 degrees  VAP Prevention Bundle:   HOB elevation maintained   oral care regularly provided   vent circuit breaks minimized  Oral Care:   suction provided   tongue brushed   mouth swabbed     Problem: Mechanical Ventilation Invasive  Goal: Optimal Device Function  Intervention: Optimize Device Care and Function  Recent Flowsheet Documentation  Taken 05/07/2024 0138 by Pearson Bolk, RRT  Airway/Ventilation Management:   airway patency maintained   pulmonary hygiene promoted   humidification applied  Taken 05/06/2024 2044 by Pearson Bolk, RRT  Airway/Ventilation Management:   airway patency maintained   humidification applied   pulmonary hygiene promoted  Oral Care:   suction provided   tongue brushed   mouth swabbed  Goal: Absence of Ventilator-Induced Lung Injury  Intervention: Prevent Ventilator-Associated Pneumonia  Recent  Flowsheet Documentation  Taken 05/07/2024 0138 by Pearson Bolk, RRT  Head of Bed [facility]) Positioning: HOB at 30-45 degrees  VAP Prevention Bundle:   HOB elevation maintained   oral care regularly provided   vent circuit breaks minimized  Taken 05/06/2024 2044 by Pearson Bolk, RRT  Head of Bed [facility]) Positioning: HOB at 30-45 degrees  VAP Prevention Bundle:   HOB elevation maintained   oral care regularly provided   vent circuit breaks minimized  Oral Care:   suction provided   tongue brushed   mouth swabbed     Problem: Mechanical Ventilation Invasive  Goal: Optimal Device Function  Intervention: Optimize Device Care and Function  Recent Flowsheet Documentation  Taken 05/07/2024 0138 by Pearson Bolk, RRT  Airway/Ventilation Management:   airway patency maintained   pulmonary hygiene promoted   humidification applied  Taken 05/06/2024 2044 by Pearson Bolk, RRT  Airway/Ventilation Management:   airway patency maintained   humidification applied   pulmonary hygiene promoted  Oral Care:   suction provided   tongue brushed   mouth swabbed  Goal: Absence of Ventilator-Induced Lung Injury  Intervention: Prevent Ventilator-Associated Pneumonia  Recent Flowsheet Documentation  Taken 05/07/2024 0138 by Pearson Bolk, RRT  Head of Bed [facility]) Positioning: HOB at 30-45 degrees  VAP Prevention Bundle:   HOB elevation maintained   oral care regularly provided   vent circuit breaks minimized  Taken 05/06/2024 2044 by Pearson Bolk, RRT  Head of Bed [facility]) Positioning: HOB at 30-45 degrees  VAP Prevention Bundle:   HOB elevation maintained   oral care regularly provided   vent circuit breaks minimized  Oral Care:   suction provided   tongue brushed   mouth swabbed     Problem: Mechanical Ventilation Invasive  Goal: Optimal Device Function  Intervention: Optimize Device Care and Function  Recent Flowsheet Documentation  Taken 05/07/2024 0138 by Pearson Bolk, RRT  Airway/Ventilation Management:   airway patency maintained   pulmonary hygiene promoted   humidification applied  Taken 05/06/2024 2044 by Pearson Bolk, RRT  Airway/Ventilation Management:   airway patency maintained   humidification applied   pulmonary hygiene promoted  Oral Care:   suction provided tongue brushed   mouth swabbed  Goal: Absence of Ventilator-Induced Lung Injury  Intervention: Prevent Ventilator-Associated Pneumonia  Recent Flowsheet Documentation  Taken 05/07/2024 0138 by Pearson Bolk, RRT  Head of Bed [facility]) Positioning: HOB at 30-45 degrees  VAP Prevention Bundle:   HOB elevation maintained   oral care regularly provided   vent circuit breaks minimized  Taken 05/06/2024 2044 by Pearson Bolk, RRT  Head of Bed [facility]) Positioning: HOB at 30-45 degrees  VAP Prevention Bundle:   HOB elevation maintained   oral care regularly provided   vent circuit breaks minimized  Oral Care:   suction provided   tongue brushed   mouth swabbed     Problem: Mechanical Ventilation Invasive  Goal: Optimal Device Function  Intervention: Optimize Device Care and Function  Recent Flowsheet Documentation  Taken 05/07/2024 0138 by Pearson Bolk, RRT  Airway/Ventilation Management:   airway patency maintained   pulmonary hygiene promoted   humidification applied  Taken 05/06/2024 2044 by Pearson Bolk, RRT  Airway/Ventilation Management:   airway patency maintained   humidification applied   pulmonary hygiene promoted  Oral Care:   suction provided   tongue brushed   mouth swabbed  Goal: Absence of Ventilator-Induced Lung Injury  Intervention: Prevent Ventilator-Associated Pneumonia  Recent Flowsheet Documentation  Taken 05/07/2024 0138 by Pearson,  Dreama, RRT  Head of Bed [facility]) Positioning: HOB at 30-45 degrees  VAP Prevention Bundle:   HOB elevation maintained   oral care regularly provided   vent circuit breaks minimized  Taken 05/06/2024 2044 by Pearson Dreama, RRT  Head of Bed [facility]) Positioning: HOB at 30-45 degrees  VAP Prevention Bundle:   HOB elevation maintained   oral care regularly provided   vent circuit breaks minimized  Oral Care:   suction provided   tongue brushed   mouth swabbed     Problem: Mechanical Ventilation Invasive  Goal: Optimal Device Function  Intervention: Optimize Device Care and Function  Recent Flowsheet Documentation  Taken 05/07/2024 0138 by Pearson Dreama, RRT  Airway/Ventilation Management:   airway patency maintained   pulmonary hygiene promoted   humidification applied  Taken 05/06/2024 2044 by Pearson Dreama, RRT  Airway/Ventilation Management:   airway patency maintained   humidification applied   pulmonary hygiene promoted  Oral Care:   suction provided   tongue brushed   mouth swabbed  Goal: Absence of Ventilator-Induced Lung Injury  Intervention: Prevent Ventilator-Associated Pneumonia  Recent Flowsheet Documentation  Taken 05/07/2024 0138 by Pearson Dreama, RRT  Head of Bed [facility]) Positioning: HOB at 30-45 degrees  VAP Prevention Bundle:   HOB elevation maintained   oral care regularly provided   vent circuit breaks minimized  Taken 05/06/2024 2044 by Pearson Dreama, RRT  Head of Bed [facility]) Positioning: HOB at 30-45 degrees  VAP Prevention Bundle:   HOB elevation maintained   oral care regularly provided   vent circuit breaks minimized  Oral Care:   suction provided   tongue brushed   mouth swabbed     Problem: Artificial Airway  Goal: Optimal Device Function  Intervention: Optimize Device Care and Function  Recent Flowsheet Documentation  Taken 05/07/2024 0138 by Pearson Dreama, RRT  Airway/Ventilation Management:   airway patency maintained   pulmonary hygiene promoted   humidification applied  Taken 05/06/2024 2044 by Pearson Dreama, RRT  Airway/Ventilation Management:   airway patency maintained   humidification applied   pulmonary hygiene promoted  Oral Care:   suction provided   tongue brushed   mouth swabbed     Problem: Mechanical Ventilation Invasive  Goal: Optimal Device Function  Intervention: Optimize Device Care and Function  Recent Flowsheet Documentation  Taken 05/07/2024 0138 by Pearson Dreama, RRT  Airway/Ventilation Management:   airway patency maintained   pulmonary hygiene promoted   humidification applied  Taken 05/06/2024 2044 by Pearson Dreama, RRT  Airway/Ventilation Management:   airway patency maintained   humidification applied   pulmonary hygiene promoted  Oral Care:   suction provided   tongue brushed   mouth swabbed  Goal: Absence of Ventilator-Induced Lung Injury  Intervention: Prevent Ventilator-Associated Pneumonia  Recent Flowsheet Documentation  Taken 05/07/2024 0138 by Pearson Dreama, RRT  Head of Bed [facility]) Positioning: HOB at 30-45 degrees  VAP Prevention Bundle:   HOB elevation maintained   oral care regularly provided   vent circuit breaks minimized  Taken 05/06/2024 2044 by Pearson Dreama, RRT  Head of Bed [facility]) Positioning: HOB at 30-45 degrees  VAP Prevention Bundle:   HOB elevation maintained   oral care regularly provided   vent circuit breaks minimized  Oral Care:   suction provided   tongue brushed   mouth swabbed     Problem: Artificial Airway  Goal: Optimal Device Function  Intervention: Optimize Device Care and Function  Recent Flowsheet Documentation  Taken 05/07/2024 0138 by Pearson,  Dreama, RRT  Airway/Ventilation Management:   airway patency maintained   pulmonary hygiene promoted   humidification applied  Taken 05/06/2024 2044 by Pearson Dreama, RRT  Airway/Ventilation Management:   airway patency maintained   humidification applied   pulmonary hygiene promoted  Oral Care:   suction provided   tongue brushed   mouth swabbed   Patient continues to be ventilated on PRVC settings.  Jamal is patent, secure, no skin breakdown noted in peristomal area.  ETS for moderate, thick, tan,pink-tinged secretions.

## 2024-05-07 NOTE — Plan of Care (Signed)
 Patient placed on PSV-CPAP, tolerated with no adverse affects. Noticed swelling around neck with vessel pulsating. Notified nurse. Secretions thick tan pink tinged. Trach care completed, airway remains patent, secure, and no breakdown noted. Emergency equipment at the bedside.    Problem: Mechanical Ventilation Invasive  Goal: Optimal Device Function  Intervention: Optimize Device Care and Function  Recent Flowsheet Documentation  Taken 05/07/2024 1425 by Heron Connors, RRT  Airway/Ventilation Management:   airway patency maintained   humidification applied   pulmonary hygiene promoted  Taken 05/07/2024 0816 by Heron Connors, RRT  Airway/Ventilation Management:   airway patency maintained   humidification applied   pulmonary hygiene promoted  Oral Care:   mouth swabbed   oral rinse provided   suction provided   teeth brushed   tongue brushed  Goal: Absence of Ventilator-Induced Lung Injury  Intervention: Prevent Ventilator-Associated Pneumonia  Recent Flowsheet Documentation  Taken 05/07/2024 1425 by Heron Connors, RRT  Head of Bed [facility]) Positioning: HOB at 30-45 degrees  VAP Prevention Bundle:   HOB elevation maintained   oral care regularly provided   vent circuit breaks minimized  Taken 05/07/2024 0816 by Heron, Arlett Goold, RRT  Head of Bed [facility]) Positioning: HOB at 30-45 degrees  VAP Prevention Bundle:   HOB elevation maintained   oral care regularly provided   vent circuit breaks minimized  Oral Care:   mouth swabbed   oral rinse provided   suction provided   teeth brushed   tongue brushed     Problem: Artificial Airway  Goal: Effective Communication  Outcome: Ongoing - Unchanged  Goal: Optimal Device Function  Outcome: Ongoing - Unchanged  Intervention: Optimize Device Care and Function  Recent Flowsheet Documentation  Taken 05/07/2024 1425 by Heron Connors, RRT  Airway/Ventilation Management:   airway patency maintained   humidification applied   pulmonary hygiene promoted  Taken 05/07/2024 0816 by Heron Connors, RRT  Airway/Ventilation Management:   airway patency maintained   humidification applied   pulmonary hygiene promoted  Oral Care:   mouth swabbed   oral rinse provided   suction provided   teeth brushed   tongue brushed  Goal: Absence of Device-Related Skin or Tissue Injury  Outcome: Ongoing - Unchanged

## 2024-05-07 NOTE — Progress Notes (Signed)
 Hospital Medicine Daily Progress Note    Assessment/Plan:  [name] is a 78 y.o. 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 She subsequently had a complex hospital stay with multiple ICU admissions, PEA arrest, multiple intubations and eventually tracheostomy. Currently weaning trach, and providing NGT feeds, but mental status continues to be poor    Episode of tachycardia and tachypena:  Suspect 2/2 pain and/or anxiety as rapidly improved with one time dose of oxycodone . Seems to be trying to reach for trach more. Also treating cellulitis as below.   - PRN tylenol  mild/mod pain, oxy 5mg  severe    Cellulitis RLE:  Worsening erythema and edema despite overall improvement in anasarca. Nearly febrile overnight 12/4, and off abx x2 days, so c/f cellulitis flaring in that setting  - DVT study negative  - Blood cultures 12/5 NGTD  - Start Cefazolin , can broaden if becomes systemically ill appearing    Anasarca:  2/2 prolonged critical illness, underlying cirrhosis, and hypoalbuminemia. RLE is worse than other limbs (though R side overall is more edematous).   - Lasix  spot dosing with close monitoring of BP, and BMP BID, currently not tolerating 2/2 hypotension    Acute hypoxemic and hypercarbic respiratory failure  Trach/vent depedent  Apneic events  D/w RT and weaning team. Plan to decrease pressure support very slowly, as she has become very hypercarbic in the past with a faster wean. Only on 25% FiO2, encephalopathy seems to be driving issues at this point given apneic events, and now back on rate   - Trial back on PSV this AM per d/w WIND team 12/5 and RT   - Routine trach care per RT  - Appreciate assistance with vent weaning  - VBG QAM since we are adjusting vent settings.     Hypotension:  Recurrent issue, had septic shock earlier in hospitalization. Seems to be sensitive to diuresis  - Improved with albumin  12/2    Encephalopathy, multifactorial:  Focal non-convulsive seizures  Multiple drivers, prolonged ICU stays, cardiac arrest, metabolic derangements have been corrected, likely some hepatic encephalopathy. Did have subclinical seizures earlier in her course as well. Cefepime  also possible contributing. Avoiding other centrally acting medications, though seems to be tolerating low dose infrequent oxycodone . As of 12/6 did open eyes to stimulation   - Continue Lactulose , only hold if >1029ml stool via FMS  - Ongoing re-evalaution especially with family at bedside as able  - Discussed with neuro, while MRI improved, and could have been 2/2 prolonged subclinical seizures, or PRES, however given lack of improvement in exam findings, toxic metabolic insults also likely contributed, and it is difficult to predict how much and within what time frame she will improve  - Continue Levetiracetam  750mg  BID and Lacosamide  100mg  BID  - Continue delirium precautions    VAP 2/2 PsA and MSSA  S.p 7 day course of antibiotics that ended 12/3    Persistent A fib  LAA thrombus:  Rate controlled  - Metop 25mg  BID  - Apixaban  5mg  BID     Decompensated MetALD cirrhosis   - Volume: anasarca as above, multiple POCUS evals without ascites  - Infection: no e/o SBP, PNA treated as above  - Bleeding: had MWT earlier in course, continue PPI BID, no e/o varices  - Encephalopathy: Rifaxamin and Lactulose  to goal of stool output via FMS    Severe  protein calorie malnutrition  Hypernatremia, resolved  - Continue feeds via NGT, depending on GOC, and LTAC abilities, may need to pursue PEG. Alerted her daughter to this, will revisit over the weekend and plan for PEG early coming week if that is the road we go down.   - FWF q2h     Chronic diastolic heart failure (HFimpEF)  Not an active issue; edema unlikely cardiogenic in nature  -- strict intake / output     Right internal jugular / carotid fistula, resolved  Apparently thrombosed; no flow on carotid doppler 03/26/2024  - discuss with vascular surgery whether any follow-up needed before discharge    GOC:  Multiple prior ACP discussions. Appreciate palliative care input. Goal is for functional recovery, unfortunately mental status appears to be very waxing/waning and has been worse the last several days  - S/p family meeting 12/4 over the phone, provided updates, daughter still wants to give her some time, is interested in hearing neurology's opinion on MRI and prognosis for recovery and would like to see her in person, unfortunately currently has a stomach bug    Advanced care planning  - Code status: Full  - Healthcare proxy: daughter, [name]  - Disposition: LTACH, likely to need PEG but daughter still deciding   PT/OT recommendations: 5xlow     FEN/GI/PPX  - Diet: Nutren 2.0 continuous TF   - IVF: None  - Bowel regimen: lactulose   - DVT ppx: On therapeutic AC    I personally spent 50 minutes face-to-face and non-face-to-face in the care of this patient, which includes all pre, intra, and post visit time on the date of service.  All documented time was specific to the E/M visit and does not include any procedures that may have been performed.    Therisa Deanne Franco, MD  Assistant Professor of Medicine    ___________________________________________________________________    Subjective:  Opens eyes to stimulation this AM, though not tracking around the room. Oxycodone  seemed to help with agitation yesterday, suspect she was having some pain.     Labs/Studies:  Labs and Studies from the last 24hrs per EMR and Reviewed    Objective:  Temp:  [36.4 ??C (97.6 ??F)-37.7 ??C (99.8 ??F)] 37.4 ??C (99.3 ??F)  Pulse:  [92-122] 107  SpO2 Pulse:  [93-122] 104  Resp:  [16-26] 25  BP: (100-161)/(36-138) 104/37  FiO2 (%):  [25 %] 25 %  SpO2:  [96 %-99 %] 97 %    Gen: lying in bed, opens eyes to voice and touch, but otherwise still not interacting  HEENT: Dry MM, NGT in place with feeds running, trach in place, on rate on vent, PERRL, resists eye opening, no abnormal eye movements  CV: irregular, S1/S2, no MRG  Pulm: CTAB, no WRR  Abd: soft, non-tender, normal BS  Ext: diffuse anasarca continues to improve, but RLE remains extremely edematous, erythema has not expanded since yesterday. LUE PICC in place   Neuro: Opens eyes to voice, not following instructions, resists movement of her arms, grimaces to painful stimuli.

## 2024-05-07 NOTE — Plan of Care (Signed)
 Trach care was done. Emergency supplies at bedside.   Problem: Mechanical Ventilation Invasive  Goal: Effective Communication  Outcome: Ongoing - Unchanged  Goal: Optimal Device Function  Outcome: Ongoing - Unchanged  Goal: Mechanical Ventilation Liberation  Outcome: Ongoing - Unchanged  Goal: Optimal Nutrition Delivery  Outcome: Ongoing - Unchanged  Goal: Absence of Device-Related Skin and Tissue Injury  Outcome: Ongoing - Unchanged  Goal: Absence of Ventilator-Induced Lung Injury  Outcome: Ongoing - Unchanged     Problem: Artificial Airway  Goal: Effective Communication  Outcome: Ongoing - Unchanged  Goal: Optimal Device Function  Outcome: Ongoing - Unchanged  Goal: Absence of Device-Related Skin or Tissue Injury  Outcome: Ongoing - Unchanged

## 2024-05-07 NOTE — Treatment Plan (Addendum)
 Brief  Update:  Paged by RN who had noted pulsatile vessel on L neck. Went to evaluate. Pt with prominent fat pads bilateral neck. Area of pulsatility seems to be a superficial vessel. Reviewed chart, did have carotid injury during central line placement, but this was on the R side of the neck. She did have a L central line at one point, but no injury to the artery on L side. She remains hemodynamically stable and neck is symmetric.     Will get carotid duplex to eval further.     Therisa Deanne Franco, MD  Assistant Professor of Medicine

## 2024-05-08 LAB — URINALYSIS WITH MICROSCOPY
BILIRUBIN UA: NEGATIVE
BLOOD UA: NEGATIVE
GLUCOSE UA: NEGATIVE
HYALINE CASTS: 4 /LPF — ABNORMAL HIGH (ref 0–1)
KETONES UA: 10 — AB
LEUKOCYTE ESTERASE UA: NEGATIVE
NITRITE UA: NEGATIVE
PH UA: 5.5 (ref 5.0–9.0)
RBC UA: 2 /HPF (ref <=4)
SPECIFIC GRAVITY UA: 1.028 (ref 1.003–1.030)
SQUAMOUS EPITHELIAL: <1 /HPF (ref 0–5)
UROBILINOGEN UA: <2
WBC UA: 3 /HPF (ref 0–5)

## 2024-05-08 LAB — BASIC METABOLIC PANEL
ANION GAP: 9 mmol/L (ref 5–14)
BLOOD UREA NITROGEN: 25 mg/dL — ABNORMAL HIGH (ref 9–23)
BUN / CREAT RATIO: 56
CALCIUM: 8.3 mg/dL — ABNORMAL LOW (ref 8.7–10.4)
CHLORIDE: 104 mmol/L (ref 98–107)
CO2: 29 mmol/L (ref 20.0–31.0)
CREATININE: 0.45 mg/dL — ABNORMAL LOW (ref 0.55–1.02)
EGFR CKD-EPI (2021) FEMALE: >90 mL/min/1.73m2 (ref >=60)
GLUCOSE RANDOM: 132 mg/dL (ref 70–179)
POTASSIUM: 3.7 mmol/L (ref 3.4–4.8)
SODIUM: 142 mmol/L (ref 135–145)

## 2024-05-08 LAB — BLOOD GAS, VENOUS
BASE EXCESS VENOUS: 4.1 — ABNORMAL HIGH (ref -2.0–2.0)
CARBOXYHEMOGLOBIN, VENOUS: 1.8 % — ABNORMAL HIGH (ref <1.2)
HCO3 VENOUS: 28 mmol/L — ABNORMAL HIGH (ref 22–27)
METHEMOGLOBIN, VENOUS: <1 % (ref <1.5)
O2 SATURATION VENOUS: 65.6 % (ref 40.0–85.0)
OXYHEMOGLOBIN, VENOUS: 64.1 % (ref 40.0–85.0)
PCO2 VENOUS: 45 mmHg (ref 40–60)
PH VENOUS: 7.42 (ref 7.32–7.43)
PO2 VENOUS: 39 mmHg (ref 30–55)

## 2024-05-08 MED ADMIN — sodium chloride (NS) 0.9 % flush 10 mL: 10 mL | INTRAVENOUS | @ 07:00:00

## 2024-05-08 MED ADMIN — sodium chloride (NS) 0.9 % flush 10 mL: 10 mL | INTRAVENOUS | @ 17:00:00

## 2024-05-08 MED ADMIN — sodium chloride (NS) 0.9 % flush 10 mL: 10 mL | INTRAVENOUS | @ 01:00:00

## 2024-05-08 MED ADMIN — rifAXIMin (XIFAXAN) oral suspension: 550 mg | GASTROENTERAL | @ 14:00:00 | Stop: 2025-04-11

## 2024-05-08 MED ADMIN — rifAXIMin (XIFAXAN) oral suspension: 550 mg | GASTROENTERAL | @ 02:00:00 | Stop: 2025-04-11

## 2024-05-08 MED ADMIN — lactulose oral solution: 30 g | GASTROENTERAL | @ 18:00:00

## 2024-05-08 MED ADMIN — lactulose oral solution: 30 g | GASTROENTERAL | @ 02:00:00

## 2024-05-08 MED ADMIN — lactulose oral solution: 30 g | GASTROENTERAL | @ 14:00:00

## 2024-05-08 MED ADMIN — folic acid (FOLVITE) tablet 1 mg: 1 mg | GASTROENTERAL | @ 14:00:00

## 2024-05-08 MED ADMIN — thiamine mononitrate (vit B1) tablet 100 mg: 100 mg | GASTROENTERAL | @ 14:00:00

## 2024-05-08 MED ADMIN — ergocalciferol (DRISDOL) oral drops 200 mcg/mL (8,000 unit/mL): 100 ug | GASTROENTERAL | @ 14:00:00 | Stop: 2024-05-21

## 2024-05-08 MED ADMIN — cyanocobalamin (vitamin B-12) tablet 1,000 mcg: 1000 ug | GASTROENTERAL | @ 14:00:00

## 2024-05-08 MED ADMIN — levETIRAcetam (KEPPRA) tablet 750 mg: 750 mg | GASTROENTERAL | @ 14:00:00

## 2024-05-08 MED ADMIN — levETIRAcetam (KEPPRA) tablet 750 mg: 750 mg | GASTROENTERAL | @ 02:00:00

## 2024-05-08 MED ADMIN — famotidine (PEPCID) tablet 20 mg: 20 mg | GASTROENTERAL | @ 14:00:00

## 2024-05-08 MED ADMIN — melatonin tablet 3 mg: 3 mg | GASTROENTERAL

## 2024-05-08 MED ADMIN — apixaban (ELIQUIS) tablet 5 mg: 5 mg | GASTROENTERAL | @ 14:00:00

## 2024-05-08 MED ADMIN — apixaban (ELIQUIS) tablet 5 mg: 5 mg | GASTROENTERAL | @ 02:00:00

## 2024-05-08 MED ADMIN — esomeprazole (NEXIUM) granules 40 mg: 40 mg | GASTROENTERAL | @ 14:00:00

## 2024-05-08 MED ADMIN — multivitamins, therapeutic with minerals tablet 1 tablet: 1 | GASTROENTERAL | @ 14:00:00

## 2024-05-08 MED ADMIN — metoPROLOL tartrate (Lopressor) tablet 25 mg: 25 mg | GASTROENTERAL | @ 14:00:00

## 2024-05-08 MED ADMIN — metoPROLOL tartrate (Lopressor) tablet 25 mg: 25 mg | GASTROENTERAL | @ 02:00:00

## 2024-05-08 MED ADMIN — lacosamide (VIMPAT) tablet 100 mg: 100 mg | GASTROENTERAL | @ 02:00:00

## 2024-05-08 MED ADMIN — lacosamide (VIMPAT) tablet 100 mg: 100 mg | GASTROENTERAL | @ 14:00:00

## 2024-05-08 MED ADMIN — ceFAZolin (ANCEF) IVPB 1 g in 50 mL dextrose (premix): 1 g | INTRAVENOUS | @ 17:00:00 | Stop: 2024-05-08

## 2024-05-08 MED ADMIN — ceFAZolin (ANCEF) IVPB 1 g in 50 mL dextrose (premix): 1 g | INTRAVENOUS | @ 07:00:00 | Stop: 2024-05-08

## 2024-05-08 MED ADMIN — ceFAZolin (ANCEF) IVPB 1 g in 50 mL dextrose (premix): 1 g | INTRAVENOUS | @ 01:00:00 | Stop: 2024-05-13

## 2024-05-08 MED ADMIN — vancomycin (VANCOCIN) 2,250 mg in sodium chloride (NS) 0.9 % 500 mL IVPB: 2250 mg | INTRAVENOUS | @ 19:00:00 | Stop: 2024-05-08

## 2024-05-08 MED ADMIN — furosemide (LASIX) injection 20 mg: 20 mg | INTRAVENOUS | @ 14:00:00 | Stop: 2024-05-08

## 2024-05-08 MED ADMIN — acetaminophen (TYLENOL) tablet 650 mg: 650 mg | ORAL | @ 18:00:00

## 2024-05-08 MED ADMIN — piperacillin-tazobactam (ZOSYN) IVPB (premix) 4.5 g: 4.5 g | INTRAVENOUS | @ 18:00:00 | Stop: 2024-05-08

## 2024-05-08 NOTE — Consults (Signed)
 Vancomycin  Therapeutic Monitoring Pharmacy Note    [name] is a 78 y.o. female starting vancomycin . Date of therapy initiation: 05/08/24    Indication: Suspected infection     Prior Dosing Information: None/new initiation     Goals:  Therapeutic Drug Levels  Vancomycin  trough goal: 10-15 mg/L    Additional Clinical Monitoring/Outcomes  Renal function, volume status (intake and output)    Results: Not applicable    Wt Readings from Last 1 Encounters:   05/01/24 (!) 111.3 kg (245 lb 6 oz)     Creatinine   Date Value Ref Range Status   05/08/2024 0.45 (L) 0.55 - 1.02 mg/dL Final   87/93/7974 9.55 (L) 0.55 - 1.02 mg/dL Final   87/94/7974 9.64 (L) 0.55 - 1.02 mg/dL Final        Pharmacokinetic Considerations and Significant Drug Interactions:  Adult (estimated initial): Vd = 79 L, ke = 0.0488 hr-1  Concurrent nephrotoxic meds: zosyn     Assessment/Plan:  Recommendation(s)  Start vancomycin  2250mg  x 1 followed by 1750mg  q24h  Estimated trough on recommended regimen: 12-14 mg/L    Follow-up  Level due: prior to fourth or fifth dose  A pharmacist will continue to monitor and order levels as appropriate    Please page service pharmacist with questions/clarifications.    Tinnie Clause, PharmD  PGY2 Critical Care Pharmacy Resident

## 2024-05-08 NOTE — Plan of Care (Signed)
 PT was placed on PS at 1106 and then placed back on PRVC at 1615  due to PT's VT  lowering and RR increasing;  PT had large to copious secretions that  are thick  tan and pinked tinged; Lower respiratory culture taken;   Trach care and Airway clearance performed; No site breakdown;  Mepilex placed under Trach  flange;  Trach care and Emergency Trach supplies at the head of the bed.     Problem: Skin Injury Risk Increased  Goal: Skin Health and Integrity  Intervention: Optimize Skin Protection  Recent Flowsheet Documentation  Taken 05/08/2024 1106 by Gretel Carlin BROCKS, RRT  Head of Bed [facility]) Positioning: HOB at 30-45 degrees  Taken 05/08/2024 0815 by Gretel Carlin BROCKS, RRT  Head of Bed [facility]) Positioning: HOB at 30-45 degrees     Problem: Breathing Pattern Ineffective  Goal: Effective Breathing Pattern  Outcome: Ongoing - Unchanged  Intervention: Promote Improved Breathing Pattern  Recent Flowsheet Documentation  Taken 05/08/2024 1106 by Gretel Carlin BROCKS, RRT  Head of Bed [facility]) Positioning: HOB at 30-45 degrees  Taken 05/08/2024 0815 by Gretel Carlin BROCKS, RRT  Head of Bed [facility]) Positioning: HOB at 30-45 degrees     Problem: Gas Exchange Impaired  Goal: Optimal Gas Exchange  Outcome: Ongoing - Unchanged  Intervention: Optimize Oxygenation and Ventilation  Recent Flowsheet Documentation  Taken 05/08/2024 1106 by Gretel Carlin BROCKS, RRT  Head of Bed [facility]) Positioning: HOB at 30-45 degrees  Taken 05/08/2024 0815 by Gretel Carlin BROCKS, RRT  Head of Bed [facility]) Positioning: HOB at 30-45 degrees     Problem: Mechanical Ventilation Invasive  Goal: Optimal Device Function  Intervention: Optimize Device Care and Function  Recent Flowsheet Documentation  Taken 05/08/2024 0815 by Gretel Carlin BROCKS, RRT  Oral Care:   mouth swabbed   suction provided   tongue brushed  Goal: Absence of Ventilator-Induced Lung Injury  Intervention: Prevent Ventilator-Associated Pneumonia  Recent Flowsheet Documentation  Taken 05/08/2024 1106 by Gretel Carlin BROCKS, RRT  Head of Bed [facility]) Positioning: HOB at 30-45 degrees  Taken 05/08/2024 0815 by Gretel Carlin BROCKS, RRT  Head of Bed [facility]) Positioning: HOB at 30-45 degrees  Oral Care:   mouth swabbed   suction provided   tongue brushed

## 2024-05-08 NOTE — Plan of Care (Signed)
 Problem: Skin Injury Risk Increased  Goal: Skin Health and Integrity  Intervention: Optimize Skin Protection  Recent Flowsheet Documentation  Taken 05/08/2024 0140 by Pearson Bolk, RRT  Head of Bed [facility]) Positioning: HOB at 30-45 degrees  Taken 05/07/2024 1951 by Pearson Bolk, RRT  Head of Bed [facility]) Positioning: HOB at 30-45 degrees     Problem: Breathing Pattern Ineffective  Goal: Effective Breathing Pattern  Intervention: Promote Improved Breathing Pattern  Recent Flowsheet Documentation  Taken 05/08/2024 0140 by Pearson Bolk, RRT  Head of Bed [facility]) Positioning: HOB at 30-45 degrees  Airway/Ventilation Management:   airway patency maintained   humidification applied   pulmonary hygiene promoted  Taken 05/07/2024 1951 by Pearson Bolk, RRT  Head of Bed [facility]) Positioning: HOB at 30-45 degrees  Airway/Ventilation Management:   airway patency maintained   humidification applied   pulmonary hygiene promoted     Problem: Gas Exchange Impaired  Goal: Optimal Gas Exchange  Intervention: Optimize Oxygenation and Ventilation  Recent Flowsheet Documentation  Taken 05/08/2024 0140 by Pearson Bolk, RRT  Head of Bed [facility]) Positioning: HOB at 30-45 degrees  Airway/Ventilation Management:   airway patency maintained   humidification applied   pulmonary hygiene promoted  Taken 05/07/2024 1951 by Pearson Bolk, RRT  Head of Bed [facility]) Positioning: HOB at 30-45 degrees  Airway/Ventilation Management:   airway patency maintained   humidification applied   pulmonary hygiene promoted     Problem: Wound  Goal: Skin Health and Integrity  Intervention: Optimize Skin Protection  Recent Flowsheet Documentation  Taken 05/08/2024 0140 by Pearson Bolk, RRT  Head of Bed [facility]) Positioning: HOB at 30-45 degrees  Taken 05/07/2024 1951 by Pearson Bolk, RRT  Head of Bed [facility]) Positioning: HOB at 30-45 degrees     Problem: Mechanical Ventilation Invasive  Goal: Optimal Device Function  Intervention: Optimize Device Care and Function  Recent Flowsheet Documentation  Taken 05/08/2024 0140 by Pearson Bolk, RRT  Airway/Ventilation Management:   airway patency maintained   humidification applied   pulmonary hygiene promoted  Taken 05/07/2024 1951 by Pearson Bolk, RRT  Airway/Ventilation Management:   airway patency maintained   humidification applied   pulmonary hygiene promoted  Oral Care:   mouth swabbed   oral rinse provided   suction provided   tongue brushed  Goal: Absence of Ventilator-Induced Lung Injury  Intervention: Prevent Ventilator-Associated Pneumonia  Recent Flowsheet Documentation  Taken 05/08/2024 0140 by Pearson Bolk, RRT  Head of Bed [facility]) Positioning: HOB at 30-45 degrees  VAP Prevention Bundle:   HOB elevation maintained   oral care regularly provided   vent circuit breaks minimized  Taken 05/07/2024 1951 by Pearson Bolk, RRT  Head of Bed [facility]) Positioning: HOB at 30-45 degrees  VAP Prevention Bundle:   HOB elevation maintained   oral care regularly provided   vent circuit breaks minimized  Oral Care:   mouth swabbed   oral rinse provided   suction provided   tongue brushed     Problem: Mechanical Ventilation Invasive  Goal: Optimal Device Function  Intervention: Optimize Device Care and Function  Recent Flowsheet Documentation  Taken 05/08/2024 0140 by Pearson Bolk, RRT  Airway/Ventilation Management:   airway patency maintained   humidification applied   pulmonary hygiene promoted  Taken 05/07/2024 1951 by Pearson Bolk, RRT  Airway/Ventilation Management:   airway patency maintained   humidification applied   pulmonary hygiene promoted  Oral Care:   mouth swabbed   oral rinse provided   suction provided   tongue  brushed  Goal: Absence of Ventilator-Induced Lung Injury  Intervention: Prevent Ventilator-Associated Pneumonia  Recent Flowsheet Documentation  Taken 05/08/2024 0140 by Pearson Bolk, RRT  Head of Bed [facility]) Positioning: HOB at 30-45 degrees  VAP Prevention Bundle:   HOB elevation maintained oral care regularly provided   vent circuit breaks minimized  Taken 05/07/2024 1951 by Pearson Bolk, RRT  Head of Bed [facility]) Positioning: HOB at 30-45 degrees  VAP Prevention Bundle:   HOB elevation maintained   oral care regularly provided   vent circuit breaks minimized  Oral Care:   mouth swabbed   oral rinse provided   suction provided   tongue brushed     Problem: Mechanical Ventilation Invasive  Goal: Optimal Device Function  Intervention: Optimize Device Care and Function  Recent Flowsheet Documentation  Taken 05/08/2024 0140 by Pearson Bolk, RRT  Airway/Ventilation Management:   airway patency maintained   humidification applied   pulmonary hygiene promoted  Taken 05/07/2024 1951 by Pearson Bolk, RRT  Airway/Ventilation Management:   airway patency maintained   humidification applied   pulmonary hygiene promoted  Oral Care:   mouth swabbed   oral rinse provided   suction provided   tongue brushed  Goal: Absence of Ventilator-Induced Lung Injury  Intervention: Prevent Ventilator-Associated Pneumonia  Recent Flowsheet Documentation  Taken 05/08/2024 0140 by Pearson Bolk, RRT  Head of Bed [facility]) Positioning: HOB at 30-45 degrees  VAP Prevention Bundle:   HOB elevation maintained   oral care regularly provided   vent circuit breaks minimized  Taken 05/07/2024 1951 by Pearson Bolk, RRT  Head of Bed [facility]) Positioning: HOB at 30-45 degrees  VAP Prevention Bundle:   HOB elevation maintained   oral care regularly provided   vent circuit breaks minimized  Oral Care:   mouth swabbed   oral rinse provided   suction provided   tongue brushed     Problem: Mechanical Ventilation Invasive  Goal: Optimal Device Function  Intervention: Optimize Device Care and Function  Recent Flowsheet Documentation  Taken 05/08/2024 0140 by Pearson Bolk, RRT  Airway/Ventilation Management:   airway patency maintained   humidification applied   pulmonary hygiene promoted  Taken 05/07/2024 1951 by Pearson Bolk, RRT  Airway/Ventilation Management:   airway patency maintained   humidification applied   pulmonary hygiene promoted  Oral Care:   mouth swabbed   oral rinse provided   suction provided   tongue brushed  Goal: Absence of Ventilator-Induced Lung Injury  Intervention: Prevent Ventilator-Associated Pneumonia  Recent Flowsheet Documentation  Taken 05/08/2024 0140 by Pearson Bolk, RRT  Head of Bed [facility]) Positioning: HOB at 30-45 degrees  VAP Prevention Bundle:   HOB elevation maintained   oral care regularly provided   vent circuit breaks minimized  Taken 05/07/2024 1951 by Pearson Bolk, RRT  Head of Bed [facility]) Positioning: HOB at 30-45 degrees  VAP Prevention Bundle:   HOB elevation maintained   oral care regularly provided   vent circuit breaks minimized  Oral Care:   mouth swabbed   oral rinse provided   suction provided   tongue brushed     Problem: Mechanical Ventilation Invasive  Goal: Optimal Device Function  Intervention: Optimize Device Care and Function  Recent Flowsheet Documentation  Taken 05/08/2024 0140 by Pearson Bolk, RRT  Airway/Ventilation Management:   airway patency maintained   humidification applied   pulmonary hygiene promoted  Taken 05/07/2024 1951 by Pearson Bolk, RRT  Airway/Ventilation Management:   airway patency maintained   humidification applied  pulmonary hygiene promoted  Oral Care:   mouth swabbed   oral rinse provided   suction provided   tongue brushed  Goal: Absence of Ventilator-Induced Lung Injury  Intervention: Prevent Ventilator-Associated Pneumonia  Recent Flowsheet Documentation  Taken 05/08/2024 0140 by Pearson Bolk, RRT  Head of Bed [facility]) Positioning: HOB at 30-45 degrees  VAP Prevention Bundle:   HOB elevation maintained   oral care regularly provided   vent circuit breaks minimized  Taken 05/07/2024 1951 by Pearson Bolk, RRT  Head of Bed [facility]) Positioning: HOB at 30-45 degrees  VAP Prevention Bundle:   HOB elevation maintained   oral care regularly provided vent circuit breaks minimized  Oral Care:   mouth swabbed   oral rinse provided   suction provided   tongue brushed     Problem: Mechanical Ventilation Invasive  Goal: Optimal Device Function  Intervention: Optimize Device Care and Function  Recent Flowsheet Documentation  Taken 05/08/2024 0140 by Pearson Bolk, RRT  Airway/Ventilation Management:   airway patency maintained   humidification applied   pulmonary hygiene promoted  Taken 05/07/2024 1951 by Pearson Bolk, RRT  Airway/Ventilation Management:   airway patency maintained   humidification applied   pulmonary hygiene promoted  Oral Care:   mouth swabbed   oral rinse provided   suction provided   tongue brushed  Goal: Absence of Ventilator-Induced Lung Injury  Intervention: Prevent Ventilator-Associated Pneumonia  Recent Flowsheet Documentation  Taken 05/08/2024 0140 by Pearson Bolk, RRT  Head of Bed [facility]) Positioning: HOB at 30-45 degrees  VAP Prevention Bundle:   HOB elevation maintained   oral care regularly provided   vent circuit breaks minimized  Taken 05/07/2024 1951 by Pearson Bolk, RRT  Head of Bed [facility]) Positioning: HOB at 30-45 degrees  VAP Prevention Bundle:   HOB elevation maintained   oral care regularly provided   vent circuit breaks minimized  Oral Care:   mouth swabbed   oral rinse provided   suction provided   tongue brushed     Problem: Artificial Airway  Goal: Optimal Device Function  Intervention: Optimize Device Care and Function  Recent Flowsheet Documentation  Taken 05/08/2024 0140 by Pearson Bolk, RRT  Airway/Ventilation Management:   airway patency maintained   humidification applied   pulmonary hygiene promoted  Taken 05/07/2024 1951 by Pearson Bolk, RRT  Airway/Ventilation Management:   airway patency maintained   humidification applied   pulmonary hygiene promoted  Oral Care:   mouth swabbed   oral rinse provided   suction provided   tongue brushed     Problem: Mechanical Ventilation Invasive  Goal: Optimal Device Function  Intervention: Optimize Device Care and Function  Recent Flowsheet Documentation  Taken 05/08/2024 0140 by Pearson Bolk, RRT  Airway/Ventilation Management:   airway patency maintained   humidification applied   pulmonary hygiene promoted  Taken 05/07/2024 1951 by Pearson Bolk, RRT  Airway/Ventilation Management:   airway patency maintained   humidification applied   pulmonary hygiene promoted  Oral Care:   mouth swabbed   oral rinse provided   suction provided   tongue brushed  Goal: Absence of Ventilator-Induced Lung Injury  Intervention: Prevent Ventilator-Associated Pneumonia  Recent Flowsheet Documentation  Taken 05/08/2024 0140 by Pearson Bolk, RRT  Head of Bed [facility]) Positioning: HOB at 30-45 degrees  VAP Prevention Bundle:   HOB elevation maintained   oral care regularly provided   vent circuit breaks minimized  Taken 05/07/2024 1951 by Pearson Bolk, RRT  Head of Bed [facility]) Positioning: [facility]  at 30-45 degrees  VAP Prevention Bundle:   HOB elevation maintained   oral care regularly provided   vent circuit breaks minimized  Oral Care:   mouth swabbed   oral rinse provided   suction provided   tongue brushed     Problem: Artificial Airway  Goal: Optimal Device Function  Intervention: Optimize Device Care and Function  Recent Flowsheet Documentation  Taken 05/08/2024 0140 by Pearson Bolk, RRT  Airway/Ventilation Management:   airway patency maintained   humidification applied   pulmonary hygiene promoted  Taken 05/07/2024 1951 by Pearson Bolk, RRT  Airway/Ventilation Management:   airway patency maintained   humidification applied   pulmonary hygiene promoted  Oral Care:   mouth swabbed   oral rinse provided   suction provided   tongue brushed   Patient tolerated PSV well, switched to [facility] settings due to increased rate with decreased Vt while on pressure support.  Jamal is patent, secure, ETS for large, thick, pink-tinged secretions

## 2024-05-08 NOTE — Progress Notes (Signed)
 Hospital Medicine Daily Progress Note    Assessment/Plan:  [name] is a 78 y.o. 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 She subsequently had a complex hospital stay with multiple ICU admissions, PEA arrest, multiple intubations and eventually tracheostomy. Currently weaning trach, and providing NGT feeds, but mental status continues to be poor    Episode of tachycardia and tachypena:  Occurred 12/5, 12/6. Suspect 2/2 pain and/or anxiety as rapidly improved with Tylenol  and oxycodone . Seems to be trying to reach for trach more.  - PRN tylenol  mild/mod pain  - Can give one time oxy if called, but trying to avoid sedating medications in general    Cellulitis RLE:  Worsening erythema and edema despite overall improvement in anasarca. Nearly febrile overnight 12/4, and off abx x2 days, so c/f cellulitis flaring in that setting  - DVT study negative  - Blood cultures 12/5 NGTD  - Start Cefazolin , can broaden if becomes systemically ill appearing    Neck swelling vs. Large fat pads:  Large fat pads bilaterally in subclavian/nec area. C/f pulsatile vessel yesterday, which today seems to have resolved. I wonder if this was related to volume overload the level of HOB as she is sitting up more today.   - F/u PVL carotids  - Diuresis     Anasarca:  2/2 prolonged critical illness, underlying cirrhosis, and hypoalbuminemia. RLE is worse than other limbs (though R side overall is more edematous).   - Lasix  spot dosing with close monitoring of BP, and BMP BID, BP better 12/7, so will give Lasix  20mg  IV and closely monitor    Acute hypoxemic and hypercarbic respiratory failure  Trach/vent depedent  Apneic events  D/w RT and weaning team. Plan to decrease pressure support very slowly, as she has become very hypercarbic in the past with a faster wean. Only on 25% FiO2, encephalopathy seems to be driving issues at this point given apneic events, poor mechanics  - Trial of pressure support 12/6, back on rate overnight   - Routine trach care per RT  - Appreciate assistance with vent weaning  - VBG QAM since we are adjusting vent settings.     Hypotension:  Recurrent issue, had septic shock earlier in hospitalization. Seems to be sensitive to diuresis  - Would resuscitate with albumin  given significant anasarca    Encephalopathy, multifactorial:  Focal non-convulsive seizures  Multiple drivers, prolonged ICU stays, cardiac arrest, metabolic derangements have been corrected, likely some hepatic encephalopathy. Did have subclinical seizures earlier in her course as well. Cefepime  also possible contributing. Avoiding other centrally acting medications, though seems to be tolerating low dose infrequent oxycodone . As of 12/6 did open eyes to stimulation, not doing so 12/7, but will continue to reassess  - Continue Lactulose , only hold if >1043ml stool via FMS  - Ongoing re-evalaution especially with family at bedside as able  - Discussed with neuro, while MRI improved, and could have been 2/2 prolonged subclinical seizures, or PRES, however given lack of improvement in exam findings, toxic metabolic insults also likely contributed, and it is difficult to predict how much and within what time frame she will improve  - Continue Levetiracetam  750mg  BID and Lacosamide  100mg  BID  - Continue delirium precautions    VAP 2/2 PsA and MSSA  S.p 7 day course of antibiotics that ended 12/3    Persistent A fib  LAA thrombus:  Rate controlled  - Metop 25mg  BID  - Apixaban  5mg  BID     Decompensated MetALD cirrhosis   - Volume: anasarca as above, multiple POCUS evals without ascites  - Infection: no e/o SBP, PNA treated as above  - Bleeding: had MWT earlier in course, continue PPI BID, no e/o varices  - Encephalopathy: Rifaxamin and Lactulose  to goal of stool output via FMS    Severe protein calorie malnutrition  Hypernatremia, resolved  - Continue feeds via NGT, depending on GOC, and LTAC abilities, may need to pursue PEG. Alerted her daughter to this, will revisit over the weekend and plan for PEG early coming week if that is the road we go down.   - FWF q2h     Chronic diastolic heart failure (HFimpEF)  Not an active issue; edema unlikely cardiogenic in nature  -- strict intake / output     Right internal jugular / carotid fistula, resolved  Apparently thrombosed; no flow on carotid doppler 03/26/2024. Was related to central line injury   - When approaching discharge, discuss with vascular surgery whether any follow-up needed before discharge    GOC:  Multiple prior ACP discussions. Appreciate palliative care input. Goal is for functional recovery, unfortunately mental status appears to be very waxing/waning and per dw neuro, prognosis is uncertain and will take a long time to recover if she ever does.   - S/p family meeting 12/4 over the phone, provided updates, daughter still wants to give her some time and would like to see her in person, unfortunately currently has a stomach bug    Advanced care planning  - Code status: Full  - Healthcare proxy: daughter, [name]  - Disposition: LTACH, likely to need PEG but daughter still deciding   PT/OT recommendations: 5xlow     FEN/GI/PPX  - Diet: Nutren 2.0 continuous TF   - IVF: None  - Bowel regimen: lactulose   - DVT ppx: On therapeutic AC    I personally spent 50 minutes face-to-face and non-face-to-face in the care of this patient, which includes all pre, intra, and post visit time on the date of service.  All documented time was specific to the E/M visit and does not include any procedures that may have been performed.    Therisa Deanne Franco, MD  Assistant Professor of Medicine    ___________________________________________________________________    Subjective:  Was on pressure support yesterday, back on rate overnight 2/2 increased rate and low TV. Still having thick secretions per d/w RT at the bedside. She is not opening her eyes this AM. No concerns from nursing staff. Hitting target stool output. UOP has dropped off without Lasix     Labs/Studies:  Labs and Studies from the last 24hrs per EMR and Reviewed    Objective:  Temp:  [36.5 ??C (97.7 ??F)-37.4 ??C (99.3 ??F)] 37.4 ??C (99.3 ??F)  Pulse:  [90-122] 95  SpO2 Pulse:  [88-119] 95  Resp:  [12-27] 20  BP: (92-158)/(30-93) 131/52  FiO2 (%):  [25 %] 25 %  SpO2:  [97 %-99 %] 97 %    Gen: lying in bed, does not open eyes or react  HEENT: Dry MM, NGT in place with feeds running, trach in place, on rate on vent, PERRL, resists eye opening. Does have prominent fat pads bilateral around trach straps, no pulsatile vessel seen this AM, ?related to [facility] being higher   CV: irregular, S1/S2, no MRG  Pulm: CTAB, no WRR  Abd: soft, non-tender,  normal BS  Ext: diffuse anasarca continues to improve, with RLE slowest to resolve. LUE PICC in place   Neuro: does not open eyes, not following instructions, resists movement of her arms, grimaces to painful stimuli.  Skin:

## 2024-05-08 NOTE — Plan of Care (Signed)
 Shift Summary  Airway suctioning was performed to clear moderate thick secretions, which were tan/yellow in color, and the patient tolerated the procedure well.    Ventilator support was maintained with stable oxygen saturation  Piperacillin -tazobactam was administered as ordered.    Overall, the patient remained on ventilator support with a patent and secure tracheostomy, and no restraint events or adverse reactions occurred during the shift.    Skin Health and Integrity: Tracheostomy site remained clean, dry, and secure throughout the shift, with dressing and ties changed and inner cannula replaced; no adverse reactions or complications noted.  Generalized edema continued with RUE and RLE edema > than left. Extremities elevated on pillows, q2 turns continued. Weeping noted from BUE and BLE.     Optimal Gas Exchange: Ventilator support maintained with stable oxygen saturation and respiratory rate; airway suctioning performed with moderate thick tan/yellow secretions removed, and patient tolerated interventions well.    Patient will remain free of restraint events: No restraint events occurred during the shift. Restraint order renewed, continues bilateral soft wrist and mitten restraints.      Pt drowsy most of shift, spontaneously opening eyes.  Pt noted to be opening her mouth in an attempt to talk occasionally when calling pt's first name. Intermittent purposeful movement of right or left foot noted.  Febrile to 38.4 today.  BLood, sputum and urine cultures sent.  IV antibiotic coverage increased to zosyn  and vancomycin .  Tolerating tube feeds at goal.    Continued diminshed UO, plus 1 small unmeasured void d/t purewick leaking.  Received 1 dose IV lasix  20mg .  Large amt FMS output.  watery stool this shift.      Spoke with pt's daughter, updated on pt progress and plan of care.

## 2024-05-09 LAB — CBC W/ AUTO DIFF
BASOPHILS ABSOLUTE COUNT: 0 10*9/L (ref 0.0–0.1)
BASOPHILS ABSOLUTE COUNT: 0 10*9/L (ref 0.0–0.1)
BASOPHILS RELATIVE PERCENT: 0.4 %
BASOPHILS RELATIVE PERCENT: 0.8 %
EOSINOPHILS ABSOLUTE COUNT: 0.3 10*9/L (ref 0.0–0.5)
EOSINOPHILS ABSOLUTE COUNT: 0.3 10*9/L (ref 0.0–0.5)
EOSINOPHILS RELATIVE PERCENT: 4.2 %
EOSINOPHILS RELATIVE PERCENT: 5.1 %
HEMATOCRIT: 24 % — ABNORMAL LOW (ref 34.0–44.0)
HEMATOCRIT: 22.6 % — ABNORMAL LOW (ref 34.0–44.0)
HEMOGLOBIN: 7.7 g/dL — ABNORMAL LOW (ref 11.3–14.9)
HEMOGLOBIN: 7.6 g/dL — ABNORMAL LOW (ref 11.3–14.9)
LYMPHOCYTES ABSOLUTE COUNT: 0.8 10*9/L — ABNORMAL LOW (ref 1.1–3.6)
LYMPHOCYTES ABSOLUTE COUNT: 0.7 10*9/L — ABNORMAL LOW (ref 1.1–3.6)
LYMPHOCYTES RELATIVE PERCENT: 10.1 %
LYMPHOCYTES RELATIVE PERCENT: 10.9 %
MEAN CORPUSCULAR HEMOGLOBIN CONC: 32 g/dL (ref 32.0–36.0)
MEAN CORPUSCULAR HEMOGLOBIN CONC: 33.4 g/dL (ref 32.0–36.0)
MEAN CORPUSCULAR HEMOGLOBIN: 29.8 pg (ref 25.9–32.4)
MEAN CORPUSCULAR HEMOGLOBIN: 30.8 pg (ref 25.9–32.4)
MEAN CORPUSCULAR VOLUME: 93.2 fL (ref 77.6–95.7)
MEAN CORPUSCULAR VOLUME: 92.3 fL (ref 77.6–95.7)
MEAN PLATELET VOLUME: 9 fL (ref 6.8–10.7)
MEAN PLATELET VOLUME: 9.2 fL (ref 6.8–10.7)
MONOCYTES ABSOLUTE COUNT: 0.7 10*9/L (ref 0.3–0.8)
MONOCYTES ABSOLUTE COUNT: 0.7 10*9/L (ref 0.3–0.8)
MONOCYTES RELATIVE PERCENT: 9.1 %
MONOCYTES RELATIVE PERCENT: 10.9 %
NEUTROPHILS ABSOLUTE COUNT: 5.7 10*9/L (ref 1.8–7.8)
NEUTROPHILS ABSOLUTE COUNT: 4.4 10*9/L (ref 1.8–7.8)
NEUTROPHILS RELATIVE PERCENT: 76.2 %
NEUTROPHILS RELATIVE PERCENT: 72.3 %
PLATELET COUNT: 148 10*9/L — ABNORMAL LOW (ref 150–450)
PLATELET COUNT: 155 10*9/L (ref 150–450)
RED BLOOD CELL COUNT: 2.58 10*12/L — ABNORMAL LOW (ref 3.95–5.13)
RED BLOOD CELL COUNT: 2.45 10*12/L — ABNORMAL LOW (ref 3.95–5.13)
RED CELL DISTRIBUTION WIDTH: 18.6 % — ABNORMAL HIGH (ref 12.2–15.2)
RED CELL DISTRIBUTION WIDTH: 18.2 % — ABNORMAL HIGH (ref 12.2–15.2)
WBC ADJUSTED: 7.5 10*9/L (ref 3.6–11.2)
WBC ADJUSTED: 6 10*9/L (ref 3.6–11.2)

## 2024-05-09 LAB — HEPATIC FUNCTION PANEL
ALBUMIN: 1.8 g/dL — ABNORMAL LOW (ref 3.4–5.0)
ALKALINE PHOSPHATASE: 207 U/L — ABNORMAL HIGH (ref 46–116)
ALT (SGPT): <7 U/L — ABNORMAL LOW (ref 10–49)
AST (SGOT): 31 U/L (ref <=34)
BILIRUBIN DIRECT: 0.3 mg/dL (ref 0.00–0.30)
BILIRUBIN TOTAL: 0.6 mg/dL (ref 0.3–1.2)
PROTEIN TOTAL: 5.3 g/dL — ABNORMAL LOW (ref 5.7–8.2)

## 2024-05-09 LAB — COMPREHENSIVE METABOLIC PANEL
ALBUMIN: 1.8 g/dL — ABNORMAL LOW (ref 3.4–5.0)
ALKALINE PHOSPHATASE: 193 U/L — ABNORMAL HIGH (ref 46–116)
ALT (SGPT): <7 U/L — ABNORMAL LOW (ref 10–49)
ANION GAP: 7 mmol/L (ref 5–14)
AST (SGOT): 30 U/L (ref <=34)
BILIRUBIN TOTAL: 0.6 mg/dL (ref 0.3–1.2)
BLOOD UREA NITROGEN: 25 mg/dL — ABNORMAL HIGH (ref 9–23)
BUN / CREAT RATIO: 57
CALCIUM: 8.3 mg/dL — ABNORMAL LOW (ref 8.7–10.4)
CHLORIDE: 107 mmol/L (ref 98–107)
CO2: 30 mmol/L (ref 20.0–31.0)
CREATININE: 0.44 mg/dL — ABNORMAL LOW (ref 0.55–1.02)
EGFR CKD-EPI (2021) FEMALE: >90 mL/min/1.73m2 (ref >=60)
GLUCOSE RANDOM: 142 mg/dL (ref 70–179)
POTASSIUM: 3.2 mmol/L — ABNORMAL LOW (ref 3.4–4.8)
PROTEIN TOTAL: 5.2 g/dL — ABNORMAL LOW (ref 5.7–8.2)
SODIUM: 144 mmol/L (ref 135–145)

## 2024-05-09 LAB — BASIC METABOLIC PANEL
ANION GAP: 9 mmol/L (ref 5–14)
BLOOD UREA NITROGEN: 18 mg/dL (ref 9–23)
BUN / CREAT RATIO: 41
CALCIUM: 8.1 mg/dL — ABNORMAL LOW (ref 8.7–10.4)
CHLORIDE: 105 mmol/L (ref 98–107)
CO2: 26 mmol/L (ref 20.0–31.0)
CREATININE: 0.44 mg/dL — ABNORMAL LOW (ref 0.55–1.02)
EGFR CKD-EPI (2021) FEMALE: >90 mL/min/1.73m2 (ref >=60)
GLUCOSE RANDOM: 168 mg/dL (ref 70–179)
POTASSIUM: 3.6 mmol/L (ref 3.4–4.8)
SODIUM: 140 mmol/L (ref 135–145)

## 2024-05-09 LAB — PROTIME-INR
INR: 1.69
PROTIME: 19.1 s — ABNORMAL HIGH (ref 9.9–12.6)

## 2024-05-09 LAB — BLOOD GAS, VENOUS
BASE EXCESS VENOUS: 3.1 — ABNORMAL HIGH (ref -2.0–2.0)
CARBOXYHEMOGLOBIN, VENOUS: 1.7 % — ABNORMAL HIGH (ref <1.2)
HCO3 VENOUS: 27 mmol/L (ref 22–27)
METHEMOGLOBIN, VENOUS: <1 % (ref <1.5)
O2 SATURATION VENOUS: 59.1 % (ref 40.0–85.0)
OXYHEMOGLOBIN, VENOUS: 57.8 % (ref 40.0–85.0)
PCO2 VENOUS: 43 mmHg (ref 40–60)
PH VENOUS: 7.42 (ref 7.32–7.43)
PO2 VENOUS: 37 mmHg (ref 30–55)

## 2024-05-09 LAB — LACTATE, VENOUS, WHOLE BLOOD: LACTATE BLOOD VENOUS: 1.1 mmol/L (ref 0.5–1.8)

## 2024-05-09 LAB — C-REACTIVE PROTEIN: C-REACTIVE PROTEIN: 50.9 mg/L — ABNORMAL HIGH (ref <=10.0)

## 2024-05-09 LAB — MAGNESIUM: MAGNESIUM: 2.1 mg/dL (ref 1.6–2.6)

## 2024-05-09 LAB — PHOSPHORUS: PHOSPHORUS: 2.9 mg/dL (ref 2.4–5.1)

## 2024-05-09 MED ADMIN — sodium chloride (NS) 0.9 % flush 10 mL: 10 mL | INTRAVENOUS | @ 07:00:00

## 2024-05-09 MED ADMIN — sodium chloride (NS) 0.9 % flush 10 mL: 10 mL | INTRAVENOUS | @ 01:00:00

## 2024-05-09 MED ADMIN — sodium chloride (NS) 0.9 % flush 10 mL: 10 mL | INTRAVENOUS | @ 16:00:00

## 2024-05-09 MED ADMIN — rifAXIMin (XIFAXAN) oral suspension: 550 mg | GASTROENTERAL | @ 14:00:00 | Stop: 2025-04-11

## 2024-05-09 MED ADMIN — rifAXIMin (XIFAXAN) oral suspension: 550 mg | GASTROENTERAL | @ 03:00:00 | Stop: 2025-04-11

## 2024-05-09 MED ADMIN — lactulose oral solution: 30 g | GASTROENTERAL | @ 14:00:00

## 2024-05-09 MED ADMIN — lactulose oral solution: 30 g | GASTROENTERAL | @ 19:00:00

## 2024-05-09 MED ADMIN — folic acid (FOLVITE) tablet 1 mg: 1 mg | GASTROENTERAL | @ 14:00:00

## 2024-05-09 MED ADMIN — thiamine mononitrate (vit B1) tablet 100 mg: 100 mg | GASTROENTERAL | @ 14:00:00

## 2024-05-09 MED ADMIN — ergocalciferol (DRISDOL) oral drops 200 mcg/mL (8,000 unit/mL): 100 ug | GASTROENTERAL | @ 14:00:00 | Stop: 2024-05-21

## 2024-05-09 MED ADMIN — cyanocobalamin (vitamin B-12) tablet 1,000 mcg: 1000 ug | GASTROENTERAL | @ 14:00:00

## 2024-05-09 MED ADMIN — levETIRAcetam (KEPPRA) tablet 750 mg: 750 mg | GASTROENTERAL | @ 14:00:00

## 2024-05-09 MED ADMIN — levETIRAcetam (KEPPRA) tablet 750 mg: 750 mg | GASTROENTERAL | @ 03:00:00

## 2024-05-09 MED ADMIN — famotidine (PEPCID) tablet 20 mg: 20 mg | GASTROENTERAL | @ 14:00:00

## 2024-05-09 MED ADMIN — melatonin tablet 3 mg: 3 mg | GASTROENTERAL | @ 22:00:00

## 2024-05-09 MED ADMIN — apixaban (ELIQUIS) tablet 5 mg: 5 mg | GASTROENTERAL | @ 14:00:00

## 2024-05-09 MED ADMIN — apixaban (ELIQUIS) tablet 5 mg: 5 mg | GASTROENTERAL | @ 03:00:00

## 2024-05-09 MED ADMIN — esomeprazole (NEXIUM) granules 40 mg: 40 mg | GASTROENTERAL | @ 14:00:00

## 2024-05-09 MED ADMIN — multivitamins, therapeutic with minerals tablet 1 tablet: 1 | GASTROENTERAL | @ 14:00:00

## 2024-05-09 MED ADMIN — metoPROLOL tartrate (Lopressor) tablet 25 mg: 25 mg | GASTROENTERAL | @ 14:00:00

## 2024-05-09 MED ADMIN — metoPROLOL tartrate (Lopressor) tablet 25 mg: 25 mg | GASTROENTERAL | @ 03:00:00

## 2024-05-09 MED ADMIN — lacosamide (VIMPAT) tablet 100 mg: 100 mg | GASTROENTERAL | @ 03:00:00

## 2024-05-09 MED ADMIN — lacosamide (VIMPAT) tablet 100 mg: 100 mg | GASTROENTERAL | @ 14:00:00

## 2024-05-09 MED ADMIN — piperacillin-tazobactam (ZOSYN) IVPB (premix) 4.5 g: 4.5 g | INTRAVENOUS | @ 11:00:00 | Stop: 2024-05-11

## 2024-05-09 MED ADMIN — piperacillin-tazobactam (ZOSYN) IVPB (premix) 4.5 g: 4.5 g | INTRAVENOUS | @ 06:00:00 | Stop: 2024-05-11

## 2024-05-09 MED ADMIN — piperacillin-tazobactam (ZOSYN) IVPB (premix) 4.5 g: 4.5 g | INTRAVENOUS | @ 17:00:00 | Stop: 2024-05-11

## 2024-05-09 MED ADMIN — piperacillin-tazobactam (ZOSYN) IVPB (premix) 4.5 g: 4.5 g | INTRAVENOUS | @ 01:00:00 | Stop: 2024-05-11

## 2024-05-09 MED ADMIN — NORepinephrine bitartrate-D5W 8 mg/250 mL (32 mcg/mL) infusion: INTRAVENOUS | @ 21:00:00 | Stop: 2024-05-09

## 2024-05-09 MED ADMIN — linezolid (ZYVOX) tablet 600 mg: 600 mg | ORAL | @ 23:00:00 | Stop: 2024-05-13

## 2024-05-09 MED ADMIN — lactated ringers bolus 500 mL: 500 mL | INTRAVENOUS | @ 21:00:00 | Stop: 2024-05-09

## 2024-05-09 MED ADMIN — lactated ringers bolus 1,000 mL: 1000 mL | INTRAVENOUS | @ 22:00:00 | Stop: 2024-05-09

## 2024-05-09 MED ADMIN — NORepinephrine 8 mg in dextrose 5 % 250 mL (32 mcg/mL) infusion PMB: 0-30 ug/min | INTRAVENOUS | @ 21:00:00

## 2024-05-09 MED ADMIN — vancomycin (VANCOCIN) 1750 mg in sodium chloride (NS) 0.9% 500 mL IVPB: 1750 mg | INTRAVENOUS | @ 18:00:00 | Stop: 2024-05-09

## 2024-05-09 NOTE — Plan of Care (Signed)
 Shift Summary  Bilateral mitts and soft wrist restraints were continued and monitored due to ongoing risk of line/tube removal, with no restraint-related events documented.   Fall prevention and skin protection interventions were consistently implemented, and no falls or skin issues were documented.   Overall, interventions for infection, safety, and skin integrity were maintained throughout the shift.   Pt drowsy, not following commands, but responding to painful stimuli. In a fib, rate controlled. VS within parameters, O2>92% on ventilator at 25%. Staff providing oral care and suctioning. Corpak in place, TF administered as ordered, tolerating well. FMS in place, lactulose  held per order parameters, provider aware. Purewick in place, diminished UO. See FS/MAR for further info.     Skin Health and Integrity: Pressure reduction techniques and devices, as well as skin protection interventions, were consistently implemented throughout the shift; no new skin issues were documented and frequent repositioning and padding were maintained.    Absence of Fall and Fall-Related Injury: Fall prevention strategies, including bed alarms, side rails, and hourly visual checks, were maintained with no documented falls or injuries during the shift.    Optimal Gas Exchange: Oxygenation was supported with ventilator and humidified oxygen, SpO2 remained above 94% throughout the shift, and airway patency was maintained with regular suctioning; venous blood gas and CBC were obtained near the end of shift.    Patient will remain free of restraint events: Soft right wrist restraint was continued and monitored throughout the shift, with documentation of ongoing need due to pulling at lines and tubes; no restraint-related events were documented.

## 2024-05-09 NOTE — Progress Notes (Signed)
 Hospital Medicine Daily Progress Note    Assessment/Plan:  [name] is a 78 y.o. 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 She subsequently had a complex hospital stay with multiple ICU admissions, PEA arrest, multiple intubations and eventually tracheostomy. Currently weaning trach, and providing NGT feeds, but mental status continues to be poor    Fever:  Mid-day 12/8. Source of infection ?leg vs trach. Did not volume resuscitate given stable VS, and volume overload but did broaden abx   - Continue Pip/Tazo and Vanc  - F/u 12/7 blood and trach cultures  - F/u CXR to eval deeper lung infection given increase serections    Cellulitis RLE:  Worsening erythema and edema despite overall improvement in anasarca. Nearly febrile overnight 12/4, and off abx x2 days, so c/f cellulitis flaring in that setting  - DVT study negative  - Blood cultures 12/5 NGTD  - Abx as above    Neck swelling vs. Large fat pads:  Large fat pads bilaterally in subclavian/nec area. C/f pulsatile vessel yesterday, which today seems to have resolved. I wonder if this was related to volume overload the level of HOB as she is sitting up more today.   - F/u PVL carotids  - Diuresis     Anasarca:  2/2 prolonged critical illness, underlying cirrhosis, and hypoalbuminemia. RLE is worse than other limbs (though R side overall is more edematous).   - Lasix  spot dosing with close monitoring of BP, and BMP BID, last Lasix  20mg  12/7    Acute hypoxemic and hypercarbic respiratory failure  Trach/vent depedent  Apneic events  D/w RT and weaning team. Plan to decrease pressure support very slowly, as she has become very hypercarbic in the past with a faster wean. Only on 25% FiO2, encephalopathy seems to be driving issues at this point given apneic events, poor mechanics  - Continue trials of CPAP  - Routine trach care per RT  - Appreciate assistance with vent weaning  - VBG QAM since we are adjusting vent settings.     Hypotension:  Recurrent issue, had septic shock earlier in hospitalization. Seems to be sensitive to diuresis  - Would resuscitate with albumin  given significant anasarca    Encephalopathy, multifactorial:  Focal non-convulsive seizures  Multiple drivers, prolonged ICU stays, cardiac arrest, metabolic derangements have been corrected, likely some hepatic encephalopathy. Did have subclinical seizures earlier in her course as well. Cefepime  also possible contributing. Avoiding other centrally acting medications, though seems to be tolerating low dose infrequent oxycodone . Intermittently opening eyes to verbal and tactile stimuli - 12/8 she did so, but is otherwise not interacting.  - Continue Lactulose , only hold if >1064ml stool via FMS  - Ongoing re-evalaution especially with family at bedside as able  - Discussed with neuro, while MRI improved, and could have been 2/2 prolonged subclinical seizures, or PRES, however given lack of improvement in exam findings, toxic metabolic insults also likely contributed, and it is difficult to predict how much and within what time frame she will improve  - Continue Levetiracetam  750mg  BID and Lacosamide  100mg  BID  - Continue delirium precautions    VAP 2/2 PsA and MSSA  S.p 7 day course of antibiotics that ended 12/3    Persistent A fib  LAA thrombus:  Rate controlled  - Metop 25mg  BID  - Apixaban  5mg  BID     Decompensated MetALD  cirrhosis   - Volume: anasarca as above, multiple POCUS evals without ascites  - Infection: no e/o SBP, PNA treated as above  - Bleeding: had MWT earlier in course, continue PPI BID, no e/o varices  - Encephalopathy: Rifaxamin and Lactulose  to goal of stool output via FMS    Severe protein calorie malnutrition  Hypernatremia, resolved  - Continue feeds via NGT, depending on GOC, and LTAC abilities, may need to pursue PEG. Alerted her daughter to this, will revisit over the weekend and plan for PEG early coming week if that is the road we go down.   - FWF q2h     Chronic diastolic heart failure (HFimpEF)  Not an active issue; edema unlikely cardiogenic in nature  -- strict intake / output     Right internal jugular / carotid fistula, resolved  Apparently thrombosed; no flow on carotid doppler 03/26/2024. Was related to central line injury   - When approaching discharge, discuss with vascular surgery whether any follow-up needed before discharge    GOC:  Multiple prior ACP discussions. Appreciate palliative care input. Goal is for functional recovery, unfortunately mental status appears to be very waxing/waning and per dw neuro, prognosis is uncertain and will take a long time to recover if she ever does.   - S/p family meeting 12/4 over the phone, provided updates, daughter still wants to give her some time and would like to see her in person, unfortunately currently has a stomach bug    Advanced care planning  - Code status: Full  - Healthcare proxy: daughter, [name]  - Disposition: LTACH, likely to need PEG but daughter still deciding   PT/OT recommendations: 5xlow     FEN/GI/PPX  - Diet: Nutren 2.0 continuous TF   - IVF: None  - Bowel regimen: lactulose   - DVT ppx: On therapeutic AC    I personally spent 50 minutes face-to-face and non-face-to-face in the care of this patient, which includes all pre, intra, and post visit time on the date of service.  All documented time was specific to the E/M visit and does not include any procedures that may have been performed.    [name] Deanne Franco, MD  Assistant Professor of Medicine    ___________________________________________________________________    Subjective:  Opens eyes to her name this AM for me and the RN. Otherwise not responding to stimuli. Noted events of last night - dislodged trach, but it was replaced without incident. Still with thick secretions.     Labs/Studies:  Labs and Studies from the last 24hrs per EMR and Reviewed    Objective:  Temp:  [36.4 ??C (97.5 ??F)-38.4 ??C (101.2 ??F)] 37.1 ??C (98.8 ??F)  Pulse:  [90-108] 98  SpO2 Pulse:  [90-114] 100  Resp:  [14-26] 18  BP: (92-143)/(57-68) 126/62  FiO2 (%):  [25 %] 25 %  SpO2:  [94 %-100 %] 99 %    Gen: lying in bed, does not open eyes or react  HEENT: Dry MM, NGT in place with feeds running, trach in place, resists eye opening.   CV: irregular, S1/S2, no MRG  Pulm: CTAB, no WRR  Abd: soft, non-tender, normal BS  Ext: diffuse anasarca continues to improve, with RLE slowest to resolve. As of 12/8, erythema of RLE is improving. LUE PICC in place   Neuro: opens eyes to name, but not when asked to. not following instructions. Reflexively wiggles toes when plantar surface of foot stimulated, withdrawals to stimuli.

## 2024-05-09 NOTE — Plan of Care (Signed)
 Problem: Skin Injury Risk Increased  Goal: Skin Health and Integrity  Intervention: Optimize Skin Protection  Recent Flowsheet Documentation  Taken 05/09/2024 0209 by Pearson Bolk, RRT  Head of Bed [facility]) Positioning: HOB at 30-45 degrees  Taken 05/08/2024 1947 by Pearson Bolk, RRT  Head of Bed [facility]) Positioning: HOB at 30-45 degrees     Problem: Breathing Pattern Ineffective  Goal: Effective Breathing Pattern  Intervention: Promote Improved Breathing Pattern  Recent Flowsheet Documentation  Taken 05/09/2024 0209 by Pearson Bolk, RRT  Head of Bed [facility]) Positioning: HOB at 30-45 degrees  Airway/Ventilation Management:   airway patency maintained   humidification applied   pulmonary hygiene promoted  Taken 05/08/2024 1947 by Pearson Bolk, RRT  Head of Bed [facility]) Positioning: HOB at 30-45 degrees  Airway/Ventilation Management:   airway patency maintained   humidification applied   pulmonary hygiene promoted     Problem: Gas Exchange Impaired  Goal: Optimal Gas Exchange  Intervention: Optimize Oxygenation and Ventilation  Recent Flowsheet Documentation  Taken 05/09/2024 0209 by Pearson Bolk, RRT  Head of Bed [facility]) Positioning: HOB at 30-45 degrees  Airway/Ventilation Management:   airway patency maintained   humidification applied   pulmonary hygiene promoted  Taken 05/08/2024 1947 by Pearson Bolk, RRT  Head of Bed [facility]) Positioning: HOB at 30-45 degrees  Airway/Ventilation Management:   airway patency maintained   humidification applied   pulmonary hygiene promoted     Problem: Wound  Goal: Skin Health and Integrity  Intervention: Optimize Skin Protection  Recent Flowsheet Documentation  Taken 05/09/2024 0209 by Pearson Bolk, RRT  Head of Bed [facility]) Positioning: HOB at 30-45 degrees  Taken 05/08/2024 1947 by Pearson Bolk, RRT  Head of Bed [facility]) Positioning: HOB at 30-45 degrees     Problem: Mechanical Ventilation Invasive  Goal: Optimal Device Function  Intervention: Optimize Device Care and Function  Recent Flowsheet Documentation  Taken 05/09/2024 0209 by Pearson Bolk, RRT  Airway/Ventilation Management:   airway patency maintained   humidification applied   pulmonary hygiene promoted  Taken 05/08/2024 1947 by Pearson Bolk, RRT  Airway/Ventilation Management:   airway patency maintained   humidification applied   pulmonary hygiene promoted  Oral Care:   mouth swabbed   oral rinse provided   suction provided   tongue brushed  Goal: Absence of Ventilator-Induced Lung Injury  Intervention: Prevent Ventilator-Associated Pneumonia  Recent Flowsheet Documentation  Taken 05/09/2024 0209 by Pearson Bolk, RRT  Head of Bed [facility]) Positioning: HOB at 30-45 degrees  VAP Prevention Bundle:   HOB elevation maintained   vent circuit breaks minimized   oral care regularly provided  Taken 05/08/2024 1947 by Pearson Bolk, RRT  Head of Bed [facility]) Positioning: HOB at 30-45 degrees  VAP Prevention Bundle:   HOB elevation maintained   oral care regularly provided   vent circuit breaks minimized  Oral Care:   mouth swabbed   oral rinse provided   suction provided   tongue brushed     Problem: Mechanical Ventilation Invasive  Goal: Optimal Device Function  Intervention: Optimize Device Care and Function  Recent Flowsheet Documentation  Taken 05/09/2024 0209 by Pearson Bolk, RRT  Airway/Ventilation Management:   airway patency maintained   humidification applied   pulmonary hygiene promoted  Taken 05/08/2024 1947 by Pearson Bolk, RRT  Airway/Ventilation Management:   airway patency maintained   humidification applied   pulmonary hygiene promoted  Oral Care:   mouth swabbed   oral rinse provided   suction provided   tongue  brushed  Goal: Absence of Ventilator-Induced Lung Injury  Intervention: Prevent Ventilator-Associated Pneumonia  Recent Flowsheet Documentation  Taken 05/09/2024 0209 by Pearson Bolk, RRT  Head of Bed [facility]) Positioning: HOB at 30-45 degrees  VAP Prevention Bundle:   HOB elevation maintained vent circuit breaks minimized   oral care regularly provided  Taken 05/08/2024 1947 by Pearson Bolk, RRT  Head of Bed [facility]) Positioning: HOB at 30-45 degrees  VAP Prevention Bundle:   HOB elevation maintained   oral care regularly provided   vent circuit breaks minimized  Oral Care:   mouth swabbed   oral rinse provided   suction provided   tongue brushed     Problem: Mechanical Ventilation Invasive  Goal: Optimal Device Function  Intervention: Optimize Device Care and Function  Recent Flowsheet Documentation  Taken 05/09/2024 0209 by Pearson Bolk, RRT  Airway/Ventilation Management:   airway patency maintained   humidification applied   pulmonary hygiene promoted  Taken 05/08/2024 1947 by Pearson Bolk, RRT  Airway/Ventilation Management:   airway patency maintained   humidification applied   pulmonary hygiene promoted  Oral Care:   mouth swabbed   oral rinse provided   suction provided   tongue brushed  Goal: Absence of Ventilator-Induced Lung Injury  Intervention: Prevent Ventilator-Associated Pneumonia  Recent Flowsheet Documentation  Taken 05/09/2024 0209 by Pearson Bolk, RRT  Head of Bed [facility]) Positioning: HOB at 30-45 degrees  VAP Prevention Bundle:   HOB elevation maintained   vent circuit breaks minimized   oral care regularly provided  Taken 05/08/2024 1947 by Pearson Bolk, RRT  Head of Bed [facility]) Positioning: HOB at 30-45 degrees  VAP Prevention Bundle:   HOB elevation maintained   oral care regularly provided   vent circuit breaks minimized  Oral Care:   mouth swabbed   oral rinse provided   suction provided   tongue brushed     Problem: Mechanical Ventilation Invasive  Goal: Optimal Device Function  Intervention: Optimize Device Care and Function  Recent Flowsheet Documentation  Taken 05/09/2024 0209 by Pearson Bolk, RRT  Airway/Ventilation Management:   airway patency maintained   humidification applied   pulmonary hygiene promoted  Taken 05/08/2024 1947 by Pearson Bolk, RRT  Airway/Ventilation Management:   airway patency maintained   humidification applied   pulmonary hygiene promoted  Oral Care:   mouth swabbed   oral rinse provided   suction provided   tongue brushed  Goal: Absence of Ventilator-Induced Lung Injury  Intervention: Prevent Ventilator-Associated Pneumonia  Recent Flowsheet Documentation  Taken 05/09/2024 0209 by Pearson Bolk, RRT  Head of Bed [facility]) Positioning: HOB at 30-45 degrees  VAP Prevention Bundle:   HOB elevation maintained   vent circuit breaks minimized   oral care regularly provided  Taken 05/08/2024 1947 by Pearson Bolk, RRT  Head of Bed [facility]) Positioning: HOB at 30-45 degrees  VAP Prevention Bundle:   HOB elevation maintained   oral care regularly provided   vent circuit breaks minimized  Oral Care:   mouth swabbed   oral rinse provided   suction provided   tongue brushed     Problem: Mechanical Ventilation Invasive  Goal: Optimal Device Function  Intervention: Optimize Device Care and Function  Recent Flowsheet Documentation  Taken 05/09/2024 0209 by Pearson Bolk, RRT  Airway/Ventilation Management:   airway patency maintained   humidification applied   pulmonary hygiene promoted  Taken 05/08/2024 1947 by Pearson Bolk, RRT  Airway/Ventilation Management:   airway patency maintained   humidification applied  pulmonary hygiene promoted  Oral Care:   mouth swabbed   oral rinse provided   suction provided   tongue brushed  Goal: Absence of Ventilator-Induced Lung Injury  Intervention: Prevent Ventilator-Associated Pneumonia  Recent Flowsheet Documentation  Taken 05/09/2024 0209 by Pearson Bolk, RRT  Head of Bed [facility]) Positioning: HOB at 30-45 degrees  VAP Prevention Bundle:   HOB elevation maintained   vent circuit breaks minimized   oral care regularly provided  Taken 05/08/2024 1947 by Pearson Bolk, RRT  Head of Bed [facility]) Positioning: HOB at 30-45 degrees  VAP Prevention Bundle:   HOB elevation maintained   oral care regularly provided vent circuit breaks minimized  Oral Care:   mouth swabbed   oral rinse provided   suction provided   tongue brushed     Problem: Mechanical Ventilation Invasive  Goal: Optimal Device Function  Intervention: Optimize Device Care and Function  Recent Flowsheet Documentation  Taken 05/09/2024 0209 by Pearson Bolk, RRT  Airway/Ventilation Management:   airway patency maintained   humidification applied   pulmonary hygiene promoted  Taken 05/08/2024 1947 by Pearson Bolk, RRT  Airway/Ventilation Management:   airway patency maintained   humidification applied   pulmonary hygiene promoted  Oral Care:   mouth swabbed   oral rinse provided   suction provided   tongue brushed  Goal: Absence of Ventilator-Induced Lung Injury  Intervention: Prevent Ventilator-Associated Pneumonia  Recent Flowsheet Documentation  Taken 05/09/2024 0209 by Pearson Bolk, RRT  Head of Bed [facility]) Positioning: HOB at 30-45 degrees  VAP Prevention Bundle:   HOB elevation maintained   vent circuit breaks minimized   oral care regularly provided  Taken 05/08/2024 1947 by Pearson Bolk, RRT  Head of Bed [facility]) Positioning: HOB at 30-45 degrees  VAP Prevention Bundle:   HOB elevation maintained   oral care regularly provided   vent circuit breaks minimized  Oral Care:   mouth swabbed   oral rinse provided   suction provided   tongue brushed     Problem: Artificial Airway  Goal: Optimal Device Function  Intervention: Optimize Device Care and Function  Recent Flowsheet Documentation  Taken 05/09/2024 0209 by Pearson Bolk, RRT  Airway/Ventilation Management:   airway patency maintained   humidification applied   pulmonary hygiene promoted  Taken 05/08/2024 1947 by Pearson Bolk, RRT  Airway/Ventilation Management:   airway patency maintained   humidification applied   pulmonary hygiene promoted  Oral Care:   mouth swabbed   oral rinse provided   suction provided   tongue brushed     Problem: Mechanical Ventilation Invasive  Goal: Optimal Device Function  Intervention: Optimize Device Care and Function  Recent Flowsheet Documentation  Taken 05/09/2024 0209 by Pearson Bolk, RRT  Airway/Ventilation Management:   airway patency maintained   humidification applied   pulmonary hygiene promoted  Taken 05/08/2024 1947 by Pearson Bolk, RRT  Airway/Ventilation Management:   airway patency maintained   humidification applied   pulmonary hygiene promoted  Oral Care:   mouth swabbed   oral rinse provided   suction provided   tongue brushed  Goal: Absence of Ventilator-Induced Lung Injury  Intervention: Prevent Ventilator-Associated Pneumonia  Recent Flowsheet Documentation  Taken 05/09/2024 0209 by Pearson Bolk, RRT  Head of Bed [facility]) Positioning: HOB at 30-45 degrees  VAP Prevention Bundle:   HOB elevation maintained   vent circuit breaks minimized   oral care regularly provided  Taken 05/08/2024 1947 by Pearson Bolk, RRT  Head of Bed [facility]) Positioning: [facility]  at 30-45 degrees  VAP Prevention Bundle:   HOB elevation maintained   oral care regularly provided   vent circuit breaks minimized  Oral Care:   mouth swabbed   oral rinse provided   suction provided   tongue brushed     Problem: Artificial Airway  Goal: Optimal Device Function  Intervention: Optimize Device Care and Function  Recent Flowsheet Documentation  Taken 05/09/2024 0209 by Pearson Bolk, RRT  Airway/Ventilation Management:   airway patency maintained   humidification applied   pulmonary hygiene promoted  Taken 05/08/2024 1947 by Pearson Bolk, RRT  Airway/Ventilation Management:   airway patency maintained   humidification applied   pulmonary hygiene promoted  Oral Care:   mouth swabbed   oral rinse provided   suction provided   tongue brushed   Jamal is patent, secure, no skin breakdown noted.  ETS for large mounts of thick, yellow/tan secretions.

## 2024-05-09 NOTE — Plan of Care (Signed)
 Patient is nonverbal, unable to follow commands but will withdraw to pain. This morning patient would open eyes to her name but this afternoon she only opened her eyes to pain. O2 sats >90% on 25% Vent. Pink tinged secretions noted post trach exchange. Afebrile. No indications of pain. UO diminished via purewick. FMS in place, >1L of stool since 7am. While bathing RN noticed blood around the FMS, MD made aware. Pt is recieveing TF at goal and was tolerating well. Bloody draining noted coming out of R nare, MD made aware at bedside. Q2H turns and standard precautions maintained. RRT called this afternoon due to hypotension. Post bath patient's pressures were running with maps < 50. MD paged and RRT initiated. Report called to MICU RN and patient transported with all belongings by ASCT.       Problem: Adult Inpatient Plan of Care  Goal: Absence of Hospital-Acquired Illness or Injury  Intervention: Identify and Manage Fall Risk  Recent Flowsheet Documentation  Taken 05/09/2024 0800 by Lennie Lang DASEN, RN  Safety Interventions:   aspiration precautions   bed alarm   bleeding precautions   environmental modification   fall reduction program maintained   lighting adjusted for tasks/safety   low bed   enteral feeding safety  Intervention: Prevent Infection  Recent Flowsheet Documentation  Taken 05/09/2024 0800 by Lennie Lang DASEN, RN  Infection Prevention:   environmental surveillance performed   equipment surfaces disinfected   hand hygiene promoted   personal protective equipment utilized   single patient room provided  Goal: Optimal Comfort and Wellbeing  Outcome: Shift Focus     Problem: Self-Care Deficit  Goal: Improved Ability to Complete Activities of Daily Living  Outcome: Shift Focus     Problem: Breathing Pattern Ineffective  Goal: Effective Breathing Pattern  Outcome: Shift Focus

## 2024-05-09 NOTE — Plan of Care (Addendum)
 Received patient from [facility]. On Trach , vented on PRVC mode. Levo titrated to 2 mcg/min after RL bolus 1 litre.HR in A fib. CT R LE ordered. To call ICU transport tonight .       Shift Summary  Norepinephrine  was initiated and titrated multiple times in response to changes in MAP, with MAP improving after administration.   FiO2 was increased from 25% to 30% in the afternoon to support oxygenation, with SpO2 remaining high throughout the shift.   Airway suction was performed twice and ventilator support was maintained, with regular assessment of respiratory pattern and airway patency documented.   Pain remained well controlled, with no signs of discomfort or distress noted during assessments.   Remained dependent for ADLs and hygiene, with staff providing all care and fall prevention interventions maintained; overall, remained stable with ongoing support and monitoring.    Optimal Comfort and Wellbeing: Pain scores remained at 0 throughout the shift, with facial expressions and body movements relaxed and no abnormal vocalizations noted; comfort interventions were maintained.    Improved Ability to Complete Activities of Daily Living: Remained dependent for bathing, oral care, and general hygiene, with staff completing these tasks during the shift.    Effective Breathing Pattern: Breathing pattern alternated between ventilator controlled and regular/unlabored, with respiratory rate fluctuating but no distress documented; airway suction was performed twice to maintain airway patency.    Absence of Fall and Fall-Related Injury: Fall prevention interventions were consistently maintained, including bed alarm activation, hourly visual checks, and scheduled toileting, with no falls or injuries documented during the shift.    Optimal Gas Exchange: SpO2 remained consistently high (97-100%) throughout the shift, and FiO2 was increased from 25% to 30% in the afternoon; airway patency was maintained and ventilator support continued as needed.

## 2024-05-09 NOTE — Progress Notes (Signed)
 MICU Daily Progress Note     Date of Service: 05/20/24    Problem List:   Principal Problem:    Bacteremia, escherichia coli  Active Problems:    Alcoholic liver disease (HHS-HCC)    HTN (hypertension)    Depression with anxiety    Class 2 severe obesity with serious comorbidity in adult    Atrial fibrillation    (CMS-HCC)    Pleural effusion    Hypernatremia    Thrombocytopenia    Cirrhosis    (CMS-HCC)    A-fib (CMS-HCC)    Hepatic encephalopathy    (CMS-HCC)    Anaphylaxis      Interval history: [name] is a 78 y.o. female with HTN, MetALD cirrhosis, severe obseity, and persistent a fib who presented to [facility] initially for encephalopathy following an aborted DCCV (LAA thrombus found). She subsequently had a complex hospital stay with multiple ICU admissions, PEA arrest, multiple intubations and eventually tracheostomy. Currently weaning trach, and providing NGT feeds, but mental status continues to be poor. She was admitted to the MICU for hypotension requiring norepinephrine .     Rapid was called for hypotension on 05-20-2024 where patient had BP 70s/20s.  On exam, she was at her baseline (opening her eyes to her name, grimacing to voice, moving all extremities to painful stimuli).  During these events she was given IVF (to which she did not respond) and ultimately started on norepinephrine .  She underwent additional sepsis workup 12/7 which showed a trach culture with Pseudomonas that is likely similar sensitivities to previous isolate.      In the MICU, vancomycin  was transitioned to linezolid .    **Per daughter, she does yet not want to have her mother told that her husband passed away in hospice on May 20, 2024**    24-hr update: As above    Neurological   Encephalopathy, multifactorial - Focal non-convulsive seizures  Multiple drivers, prolonged ICU stays, cardiac arrest, metabolic derangements have been corrected, likely some hepatic encephalopathy. Did have subclinical seizures seen on EEG earlier in her course as well. Cefepime  also possible contributing. Avoiding other centrally acting medications, though seems to be tolerating low dose infrequent oxycodone . Intermittently opening eyes to verbal and tactile stimuli - 20-May-2024 she did so, but is otherwise not interacting.  Of note, has become more encephalopathic in the past if she does not have adequate bowel movements.  - Continue Lactulose , only hold if >1072ml stool via FMS  - Continue Rifaximin  550 mg twice daily  - Ongoing re-evalaution especially with family at bedside as able  - Discussed with neuro, while MRI Brain 12/3 improved, and could have been 2/2 prolonged subclinical seizures, or PRES, however given lack of improvement in exam findings, toxic metabolic insults also likely contributed, and it is difficult to predict how much and within what time frame she will improve  - Continue Levetiracetam  750mg  BID and Lacosamide  100mg  BID  - Continue delirium precautions      Analgesia: Pain adequately controlled  RASS at goal? N/A, not on sedation  [place] Agitation Assessment Scale (RASS) : -2 (2024-05-20  3:05 PM)       Pulmonary   Chronic hypoxemic and hypercarbic respiratory failure - Trach/vent depedent - Apneic events  Initially suspected to be due to encephalopathy as above.  Multiple chest x-rays have shown pleural effusions.  Intubated 10/24 through 11/14. Failed extubation attempts and tracheostomy placed on 11/14.  While with hospitalist team, has had difficulty weaning pressure support and has required very  slow decreases as she has become hypercarbic with faster weans.  Given her apneic episodes and poor mechanics, mental status/encephalopathy appears to be driving vent dependence.  - Continue trials of CPAP  - Routine trach care per RT  - VBG qAM    S RR:  [12-14] 14  FiO2 (%):  [25 %-30 %] 30 %  S VT:  [350 mL] 350 mL  PR SUP:  [15 cm H20] 15 cm H20  O2 Device: Ventilator    Cardiovascular   Hypotension  Recurrent issue.  Septic shock earlier in hospitalization (on transfer to MICU from [facility]). Seems to be sensitive to diuresis.  - Norepinephrine  gtt    Persistent AFib - LAA thrombus  Rate controlled with medication.  - Metop 25mg  BID  - Apixaban  5mg  BID     History of prolonged Qtc  - Regular EKGs  - Most recent EKG 12/5 with QTc Frederica 405    Right internal jugular / carotid fistula, resolved  Apparently thrombosed; no flow on carotid doppler 03/26/2024. Was related to central line injury   - When approaching discharge, discuss with vascular surgery whether any follow-up needed before discharge  -- strict intake / output    PEA arrest s/p ROSC  Patient with PEA arrest peri-intubation with ROSC prior to transfer from [place].      Chronic diastolic heart failure (HFimpEF)  Not an active issue; edema unlikely cardiogenic in nature    Renal   Anasarca  Likely 2/2 prolonged critical illness, underlying cirrhosis, and hypoalbuminemia. RLE is worse than other limbs (though R side overall is more edematous).  Was receiving Lasix  while with hospitalist team.  Last Lasix  was 20 mg 12/7.  - Holding Lasix  12/8 ISO infection/fevering  - Of note, pulsatile vessel seen on neck on 12/7 that was resolved on 12/8.  Could have been secondary to volume overload.  - PVL BL carotid 12/8 pending  - Holding diuresis in setting of infection    Infectious Disease/Autoimmune   Fever - c/f Sepsis - RLE cellulitis  Began fevering mid-day 12/8. Source of infection leg wound (RLE cellulitis) vs trach culture. No volume resuscitation prior to transfer to MICU given stable VS in the morning and volume overloaded status. Received during rapid event.  Previous lower respiratory culture 11/27 with MRSA and Pseudomonas (pan-susceptible).  Of note, on exam having increased secretions per hospitalist team.  - Additional 1L LR s/p move to MICU  - Infectious workup 12/7              - Blood cultures NG 24hr              - Urine culture 12/7 no growth              - [facility] 12/7 (Tracheal aspirate) with Pseudomonas, remainder in progress  - CXR 12/8 with pleural effusions and possible consolidation that could represent pneumonia versus aspiration.  - Repeat infectious workup 12/8              - Blood cultures              - UA with reflex              - RPP  - Continue linezolid  (12/8 - )  - Continue Zosyn  4.5 g q6h (12/7 - )    Ventilator associated pneumonia  Likely secondary to Pseudomonas as seen on lower respiratory culture and MSSA.  - Treated with 7-day course antibiotics through 12/3.  -  CXR 12/8 with pleural effusions and possible consolidation that could represent pneumonia versus aspiration.   - Additional infectious workup as above    C/f Cellulitis RLE  Worsening erythema and edema despite overall improvement in anasarca. Nearly febrile overnight 12/4, and off abx x2 days, so c/f cellulitis flaring in that setting.  - BLE PVL 12/4 without signs of DVT  - Workup and antibiotics as above    History of E. coli bacteremia.  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  GLIM criteria:   Pt does not meet criteria  -I have screened this patient for malnutrition and they did NOT meet criteria for malnutrition based on GLIM criteria.  -Nutrition consulted yes, see RD assessment below  RD assessment:  Patient does not meet AND/ASPEN criteria for malnutrition at this time (03/16/24 1326)      - Consider reassessment by nutrition  - Tube feeds at goal.  FWF 100 mL q2h   - Depending on GOC, may require PEG.  Discussed with daughter by hospitalist team with plans to revisit early this week if required.       Heme/Coag   Anemia, chronic (stable)  - Initially secondary to blood loss (early in hospitalization)  - Stable  - CTM      Endocrine   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)   -  holding home anastrozole     Integumentary     #c/f RLE cellulitis  - See above    #  - Pending assessment    Prophylaxis/LDA/Restraints/Consults   ICU Checklist completed:  see ICU rounding navigator      Patient Lines/Drains/Airways Status       Active Active Lines, Drains, & Airways       Name Placement date Placement time Site Days    Tracheostomy Shiley 6 Cuffed 05/09/24  1327  6  less than 1    NG/OG Tube Feedings 10 Fr. Right nostril 04/01/24  1138  Right nostril  38    External Urinary Device 04/13/24 With Suction 04/13/24  1700  -- 26    PICC Double Lumen 04/06/24 Left Basilic 04/06/24  1558  Basilic  33    Peripheral IV 88/79/74 Right Wrist 04/21/24  0830  Wrist  18    Peripheral IV 04/30/24 Distal;Posterior;Right Forearm 04/30/24  1832  Forearm  8                  Patient Lines/Drains/Airways Status       Active Wounds       Name Placement date Placement time Site Days    Wound 03/13/24 Irritant Contact Dermatitis Incontinence Sacrum Mid gluteal cleft MASD? 03/13/24  --  Sacrum  57 Wound 04/27/24 Traumatic Abrasion Pretibial Right 04/27/24  1500  Pretibial  12    Wound 05/09/24 Elbow Posterior;Right 05/09/24  0750  Elbow  less than 1                    Goals of Care     GOC   Multiple prior ACP discussions. Appreciate palliative care input. Goal is for functional recovery, unfortunately mental status appears to be very waxing/waning and per dw neuro, prognosis is uncertain and will take a long time to recover if she ever does.   - S/p family meeting 12/4 over the phone, provided updates, daughter still wants to give her some time and would like to see her in person, unfortunately currently has a stomach bug    Code Status:   Orders Placed This Encounter   Procedures    Full Code     Standing Status:   Standing     Number of Occurrences:   1        Designated Healthcare Decision Maker:  Ms. Kaelin designated healthcare decision maker(s) is/are   HCDM (patient stated preference): Futrell,[name] - Daughter - [phone]    HCDM (patient stated preference): Pickler,[name] - Spouse - [phone]. See HCDM section of Epic sidebar/storyboard or ACP tab in patient chart for details regarding active HCDMs and patient capacity for decision-making.      Subjective     Intubated.  Minimally responsive on exam.    Objective     Vitals - past 24 hours  Temp:  [36.4 ??C (97.5 ??F)-37.1 ??C (98.8 ??F)] 36.8 ??C (98.2 ??F)  Pulse:  [78-108] 85  SpO2 Pulse:  [77-114] 84  Resp:  [13-26] 19  BP: (74-143)/(23-82) 107/74  FiO2 (%):  [25 %-30 %] 30 %  SpO2:  [94 %-100 %] 100 % Intake/Output  I/O last 3 completed shifts:  In: 3460 [P.O.:30; NG/GT:3330; IV Piggyback:100]  Out: 2430 [Urine:950; Stool:1480]     Physical Exam:    Gen: lying in bed, does not open eyes or react  HEENT: Dry MM, NGT in place with feeds running, trach in place, resists eye opening (by moving head).   CV: Regular rate, irregular rhythm, no MRG  Pulm: CTAB  anteriorly  Abd: soft, non-tender, normal BS  Ext: diffuse edema right > left; LUE PICC in place  Neuro: opens eyes to name, but not on command. not following instructions. Reflexively wiggles toes when plantar surface of foot stimulated, withdrawals to stimuli.  I am  I know I do not know for my  Continuous Infusions:   Infusions Meds[1]    Scheduled Medications:   Scheduled Medications[2]    PRN medications:  PRN Medications[3]    Data/Imaging Review: Reviewed in Epic and personally interpreted on 05/09/2024. See EMR for detailed results.       Lacinda Rimes, MD  PGY-1, [facility]         [  1]    NORepinephrine  bitartrate-NS 3 mcg/min (05/09/24 1547)   [2]    apixaban   5 mg Enteral tube: gastric BID    cyanocobalamin  (vitamin B-12)  1,000 mcg Enteral tube: gastric Daily    ergocalciferol   100 mcg Enteral tube: gastric Daily    esomeprazole   40 mg Enteral tube: gastric Daily    famotidine   20 mg Enteral tube: gastric Daily    flu vac 2025 [id number](PF)  0.5 mL Intramuscular During hospitalization    folic acid   1 mg Enteral tube: gastric Daily    lacosamide   100 mg Enteral tube: gastric BID    lactated ringers   1,000 mL Intravenous Once    lactulose   30 g Enteral tube: gastric TID    levETIRAcetam   750 mg Enteral tube: gastric BID    linezolid   600 mg Oral Q12H    melatonin  3 mg Enteral tube: gastric QPM    metoPROLOL  tartrate  25 mg Enteral tube: gastric BID    multivitamins (ADULT)  1 tablet Enteral tube: gastric Daily    piperacillin -tazobactam (ZOSYN ) IV (intermittent)  4.5 g Intravenous Q6H    rifAXIMin   550 mg Enteral tube: gastric BID    sodium chloride   10 mL Intravenous Q8H    sodium chloride   10 mL Intravenous Q8H    thiamine  mononitrate (vit B1)  100 mg Enteral tube: gastric Daily   [3] acetaminophen 

## 2024-05-10 LAB — BLOOD GAS, VENOUS
BASE EXCESS VENOUS: 4.3 — ABNORMAL HIGH (ref -2.0–2.0)
CARBOXYHEMOGLOBIN, VENOUS: 1.5 % — ABNORMAL HIGH (ref <1.2)
HCO3 VENOUS: 29 mmol/L — ABNORMAL HIGH (ref 22–27)
METHEMOGLOBIN, VENOUS: <1 % (ref <1.5)
O2 SATURATION VENOUS: 81 % (ref 40.0–85.0)
OXYHEMOGLOBIN, VENOUS: 79.6 % (ref 40.0–85.0)
PCO2 VENOUS: 42 mmHg (ref 40–60)
PH VENOUS: 7.44 — ABNORMAL HIGH (ref 7.32–7.43)
PO2 VENOUS: 45 mmHg (ref 30–55)

## 2024-05-10 LAB — BASIC METABOLIC PANEL
ANION GAP: 11 mmol/L (ref 5–14)
ANION GAP: 9 mmol/L (ref 5–14)
BLOOD UREA NITROGEN: 22 mg/dL (ref 9–23)
BLOOD UREA NITROGEN: 21 mg/dL (ref 9–23)
BUN / CREAT RATIO: 48
BUN / CREAT RATIO: 46
CALCIUM: 7.9 mg/dL — ABNORMAL LOW (ref 8.7–10.4)
CALCIUM: 8.4 mg/dL — ABNORMAL LOW (ref 8.7–10.4)
CHLORIDE: 105 mmol/L (ref 98–107)
CHLORIDE: 108 mmol/L — ABNORMAL HIGH (ref 98–107)
CO2: 28 mmol/L (ref 20.0–31.0)
CO2: 28 mmol/L (ref 20.0–31.0)
CREATININE: 0.46 mg/dL — ABNORMAL LOW (ref 0.55–1.02)
CREATININE: 0.46 mg/dL — ABNORMAL LOW (ref 0.55–1.02)
EGFR CKD-EPI (2021) FEMALE: >90 mL/min/1.73m2 (ref >=60)
EGFR CKD-EPI (2021) FEMALE: >90 mL/min/1.73m2 (ref >=60)
GLUCOSE RANDOM: 104 mg/dL — ABNORMAL HIGH (ref 70–99)
GLUCOSE RANDOM: 118 mg/dL (ref 70–179)
POTASSIUM: 3.2 mmol/L — ABNORMAL LOW (ref 3.4–4.8)
POTASSIUM: 4 mmol/L (ref 3.4–4.8)
SODIUM: 144 mmol/L (ref 135–145)
SODIUM: 145 mmol/L (ref 135–145)

## 2024-05-10 LAB — MAGNESIUM: MAGNESIUM: 2 mg/dL (ref 1.6–2.6)

## 2024-05-10 MED ADMIN — sodium chloride (NS) 0.9 % flush 10 mL: 10 mL | INTRAVENOUS | @ 08:00:00

## 2024-05-10 MED ADMIN — sodium chloride (NS) 0.9 % flush 10 mL: 10 mL | INTRAVENOUS | @ 01:00:00

## 2024-05-10 MED ADMIN — rifAXIMin (XIFAXAN) oral suspension: 550 mg | GASTROENTERAL | @ 14:00:00 | Stop: 2025-04-11

## 2024-05-10 MED ADMIN — rifAXIMin (XIFAXAN) oral suspension: 550 mg | GASTROENTERAL | @ 04:00:00 | Stop: 2025-04-11

## 2024-05-10 MED ADMIN — lactulose oral solution: 30 g | GASTROENTERAL | @ 20:00:00

## 2024-05-10 MED ADMIN — lactulose oral solution: 30 g | GASTROENTERAL | @ 14:00:00

## 2024-05-10 MED ADMIN — lactulose oral solution: 30 g | GASTROENTERAL | @ 01:00:00

## 2024-05-10 MED ADMIN — folic acid (FOLVITE) tablet 1 mg: 1 mg | GASTROENTERAL | @ 14:00:00

## 2024-05-10 MED ADMIN — thiamine mononitrate (vit B1) tablet 100 mg: 100 mg | GASTROENTERAL | @ 14:00:00

## 2024-05-10 MED ADMIN — ergocalciferol (DRISDOL) oral drops 200 mcg/mL (8,000 unit/mL): 100 ug | GASTROENTERAL | @ 15:00:00 | Stop: 2024-05-21

## 2024-05-10 MED ADMIN — cyanocobalamin (vitamin B-12) tablet 1,000 mcg: 1000 ug | GASTROENTERAL | @ 14:00:00

## 2024-05-10 MED ADMIN — levETIRAcetam (KEPPRA) tablet 750 mg: 750 mg | GASTROENTERAL | @ 14:00:00

## 2024-05-10 MED ADMIN — levETIRAcetam (KEPPRA) tablet 750 mg: 750 mg | GASTROENTERAL | @ 01:00:00

## 2024-05-10 MED ADMIN — famotidine (PEPCID) tablet 20 mg: 20 mg | GASTROENTERAL | @ 14:00:00

## 2024-05-10 MED ADMIN — melatonin tablet 3 mg: 3 mg | GASTROENTERAL | @ 23:00:00

## 2024-05-10 MED ADMIN — apixaban (ELIQUIS) tablet 5 mg: 5 mg | GASTROENTERAL | @ 01:00:00

## 2024-05-10 MED ADMIN — apixaban (ELIQUIS) tablet 5 mg: 5 mg | GASTROENTERAL | @ 14:00:00

## 2024-05-10 MED ADMIN — esomeprazole (NEXIUM) granules 40 mg: 40 mg | GASTROENTERAL | @ 14:00:00 | Stop: 2024-05-10

## 2024-05-10 MED ADMIN — multivitamins, therapeutic with minerals tablet 1 tablet: 1 | GASTROENTERAL | @ 14:00:00

## 2024-05-10 MED ADMIN — metoPROLOL tartrate (Lopressor) tablet 25 mg: 25 mg | GASTROENTERAL | @ 14:00:00

## 2024-05-10 MED ADMIN — metoPROLOL tartrate (Lopressor) tablet 25 mg: 25 mg | GASTROENTERAL | @ 04:00:00

## 2024-05-10 MED ADMIN — lacosamide (VIMPAT) tablet 100 mg: 100 mg | GASTROENTERAL | @ 14:00:00

## 2024-05-10 MED ADMIN — lacosamide (VIMPAT) tablet 100 mg: 100 mg | GASTROENTERAL | @ 01:00:00

## 2024-05-10 MED ADMIN — doxycycline (VIBRA-TABS) tablet 100 mg: 100 mg | ORAL | @ 23:00:00 | Stop: 2024-05-13

## 2024-05-10 MED ADMIN — piperacillin-tazobactam (ZOSYN) IVPB (premix) 4.5 g: 4.5 g | INTRAVENOUS | @ 08:00:00 | Stop: 2024-05-10

## 2024-05-10 MED ADMIN — piperacillin-tazobactam (ZOSYN) IVPB (premix) 4.5 g: 4.5 g | INTRAVENOUS | @ 02:00:00 | Stop: 2024-05-11

## 2024-05-10 MED ADMIN — piperacillin-tazobactam (ZOSYN) IVPB (premix) 4.5 g: 4.5 g | INTRAVENOUS | @ 12:00:00 | Stop: 2024-05-10

## 2024-05-10 MED ADMIN — potassium chloride 20 mEq in 100 mL IVPB Premix: 20 meq | INTRAVENOUS | @ 16:00:00 | Stop: 2024-05-10

## 2024-05-10 MED ADMIN — piperacillin-tazobactam (ZOSYN) IVPB (premix) 4.5 g: 4.5 g | INTRAVENOUS | @ 19:00:00 | Stop: 2024-05-11

## 2024-05-10 MED ADMIN — potassium chloride (KLOR-CON) packet 20 mEq: 20 meq | GASTROENTERAL | @ 15:00:00 | Stop: 2024-05-10

## 2024-05-10 MED ADMIN — iohexol (OMNIPAQUE) 350 mg iodine/mL solution 100 mL: 100 mL | INTRAVENOUS | @ 10:00:00 | Stop: 2024-05-10

## 2024-05-10 MED ADMIN — potassium chloride 20 mEq in 100 mL IVPB Premix: 20 meq | INTRAVENOUS | @ 15:00:00 | Stop: 2024-05-10

## 2024-05-10 MED ADMIN — alteplase (ACTIVase) injection small catheter clearance 1 mg: 1 mg | @ 20:00:00 | Stop: 2024-05-10

## 2024-05-10 NOTE — Plan of Care (Signed)
 Problem: Gas Exchange Impaired  Goal: Optimal Gas Exchange  Outcome: Ongoing - Unchanged  Intervention: Optimize Oxygenation and Ventilation  Recent Flowsheet Documentation  Taken 05/10/2024 1657 by Sonna Lonni SAUNDERS, RRT  Head of Bed [facility]) Positioning: HOB at 30-45 degrees  Taken 05/10/2024 1438 by Sonna Lonni SAUNDERS, RRT  Head of Bed [facility]) Positioning: HOB at 30-45 degrees  Taken 05/10/2024 1027 by Sonna Lonni SAUNDERS, RRT  Head of Bed (HOB) Positioning: HOB at 30-45 degrees     Problem: Mechanical Ventilation Invasive  Goal: Effective Communication  Outcome: Ongoing - Unchanged  Goal: Optimal Device Function  Outcome: Ongoing - Unchanged  Intervention: Optimize Device Care and Function  Recent Flowsheet Documentation  Taken 05/10/2024 1027 by Sonna Lonni SAUNDERS, RRT  Oral Care:   mouth swabbed   oral rinse provided   suction provided   teeth brushed   tongue brushed  Goal: Mechanical Ventilation Liberation  Outcome: Ongoing - Unchanged  Goal: Optimal Nutrition Delivery  Outcome: Ongoing - Unchanged  Goal: Absence of Device-Related Skin and Tissue Injury  Outcome: Ongoing - Unchanged  Goal: Absence of Ventilator-Induced Lung Injury  Outcome: Ongoing - Unchanged  Intervention: Prevent Ventilator-Associated Pneumonia  Recent Flowsheet Documentation  Taken 05/10/2024 1657 by Sonna Lonni SAUNDERS, RRT  Head of Bed [facility]) Positioning: HOB at 30-45 degrees  Taken 05/10/2024 1438 by Sonna Lonni SAUNDERS, RRT  Head of Bed [facility]) Positioning: HOB at 30-45 degrees  Taken 05/10/2024 1027 by Sonna Lonni SAUNDERS, RRT  Head of Bed (HOB) Positioning: HOB at 30-45 degrees  Oral Care:   mouth swabbed   oral rinse provided   suction provided   teeth brushed   tongue brushed     Problem: Artificial Airway  Goal: Effective Communication  Outcome: Ongoing - Unchanged  Goal: Optimal Device Function  Outcome: Ongoing - Unchanged  Intervention: Optimize Device Care and Function  Recent Flowsheet Documentation  Taken 05/10/2024 1027 by Sonna Lonni SAUNDERS, RRT  Oral Care:   mouth swabbed   oral rinse provided   suction provided   teeth brushed   tongue brushed  Goal: Absence of Device-Related Skin or Tissue Injury  Outcome: Ongoing - Unchanged

## 2024-05-10 NOTE — Progress Notes (Signed)
 Palliative Care Progress Note        Consultation from Requesting Attending Physician:  Thresa Lemond Needy, MD  Primary Care Provider:  Alyse Slater Pao, MD      Assessment/Plan:      SUMMARY:  This 78 y.o. 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    Symptom Assessment and Recommendations:      #Dyspnea 2/2 Acute hypoxemic and hypercarbic respiratory failure: Ongoing, currently trach/vent dependent  - Vent wean per ROAD team    #Anasarca: Ongoing, particularly in LEs. 2/2 prolonged critical illness, underlying cirrhosis, and hypoalbuminemia. Complicated by likely cirrhosis.  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 Multi-factorial in setting of cirrhosis, infection, respiratory failure and baseline microvascular changes in brain.   - Continue lactulose  - very important to family that she continue to have sufficient BMs  - Cont delirium precautions    Goals of care / ACP:  Code Status:   Code Status: Full Code   Healthcare decision-maker if lacks capacity:   HCDM (patient stated preference): Plate,[name] - Daughter - [phone]    HCDM (patient stated preference): Giambra,[name] - Spouse - [phone]     Goals of care as discussed as of 12/9:  - Rapid response overnight for concern for likely sepsis. Spoke with daughter [name] via phone and at bedside. [name] has been encouraged by her mom's responsiveness when she has been with her at the bedside, where her mom was making eye cntact and mouthing I love you, and is eager to allow her more time to recover. She is hopeful she will be able to improve from this latest setback with antibiotics and support and begin working towards recovery again. Of note, patient's husband died on hospice yesterday, and family has been trying to cope with this loss amid patient's ongoing illness.    Goals of care as discussed as of 12/4:   - Telephone conference with [name], primary team, and palliative.  MRI results shared with [name], noting near resolution of prior diffuse cortical restricted diffusion. Despite this, patinet has not shown any improvement in her neurological exam. We further shared that MRI also showed evidence of chronic microvascular ischemic disease, global parenchymal volume loss, and remote ischemic infarct - all suggestive of more fragile brain at baseline. Primary team also shared update that patinet has been having apneic spells while on the ventilator and has therefore vent settings have been adjsuted to provide more support. [name] shared that when she was with her mom on Sunday (11/30) it seemed that she was more alert and able to blink on command, but then on Tuesday (12/2) was not able to do so.   GLENWOOD [name] received the above news and understands that medical teams remain worried that we haven't seen improvement in her neuro exam or her respiratory status. [name] would like to see her mom in person again (likely tomorrow) before making any further decisions. We affirmed that and noted there is not urgency to make a quick  decision. The next decisions we would face would be whether to place a PEG and then whether to refer to LTAC. [name] affirmed prior wishes that they want to allow patient to have time to recover if it is possible, but if it becomes more clear that functional recovery is not possible or very unlikely, then they may take a different approach.   [place] [name] also shared that her father enrolled in home hospice yesterday, which has certainly added to the stress of what is going on with her mom's health.       Goals of care as discussed on 11/18:  - [name] shares this has been a tough week as her mom has gotten sicker, and her dad has made it out of rehab and is living with her, requiring a lot of care and supervision.  She is continuing to work as well and care for her children.  - She has heard that her mom's mental status has worsened and she has not been waking up.  She has heard there is concern about her ammonia levels, and potential seizures.  - She is worried, and at the same time holding out hope still that her mom could improve from this  - She is amenable to a family meeting with PC and ICU teams, although unsure of timing due to her work schedule and other responsibilities    Goals of care as discussed on 11/12:  - Please see ACP note from same day for further details.  - In brief, presented family with options to proceed with tracheostomy versus extubation without a plan for reintubation if she were to fail (plan at that time would be to transition to comfort focused care and allow natural death)  - Family affirmed Cianni's ultimate goals would be to eventually get back home and lead a functional life.  They believe she would be willing to go through a prolonged hospitalization, tracheostomy and rehab if there is some hope of reaching that goal.  ICU team shared that they thought while the road ahead would be long and difficult, but this was definitely possible and that recent indicators have showed that Conita is slowly improving.  - Patient and family ultimately decided to proceed with tracheostomy    Goals of care as discussed on 11/10:  - Lajuanda's family shares this has been a long and difficult hospitalization, with lots of unexpected decompensations  - They share that before this hospitalization, Deandria was doing well at home.  She was independent.  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  This is a MAJOR change for her.  - The ICU team shares that in the past, there have been questions from the family about if they would offer extubation and reintubation if indicated for a 5th time (as an alternative to proceeding straight to tracheostomy).  They would likely not offer this due to substantial risks without much benefit.    Practical, Emotional, Spiritual Support Recommendations:  Patient's daughters would like the patient to be included in decision making as much as possible    Recommendations discussed with primary team via Epic chat    Thank you for this consult. Please page Palliative Care if there are any questions.  Palliative Care team will continue to follow.    Subjective:     Recent Events:    Rapid response overnight for hypotension and transferred to the MICU for concerning for possibly septic shock in the setting of leg cellulitis and  positive trach culture.     Objective:       Function:  30% - Ambulation: Totally Bed Bound / Unable to do any work, extensive disease / Self-Care: Total care / Intake: Reduced / Level of Conscious: Full, drowsy, or confusion    Temp:  [36.8 ??C (98.2 ??F)-37.2 ??C (98.9 ??F)] 37.2 ??C (98.9 ??F)  Pulse:  [78-112] 99  SpO2 Pulse:  [77-109] 109  Resp:  [13-29] 25  BP: (72-161)/(20-123) 130/65  FiO2 (%):  [25 %-30 %] 25 %  SpO2:  [94 %-100 %] 98 %    Physical Exam:  General:  Ill appearing woman in NAD  HEENT:  s/p trach, connected to vent  Cardiovascular:  No evidence of cyanosis, RRR  Pulmonary:  Rhonchorous breath sounds on ventilator  Gastrointestinal: Soft, mildly distended  Ext:  Persistent pitting edema bilateral LE, also present in UE  Neuro: Opens eyes to voice today, not able to follow any simple commands, moving UEs spontaneously    Testing reviewed and interpreted:  Reviewed and interpreted test results for cbc with anemia; eGFR >90, low albumin  affecting assessment of underlying illness severity and prognosis    I personally spent 45 minutes face-to-face and non-face-to-face in the care of this patient, which includes all pre, intra, and post visit time on the date of service.  All documented time was specific to the E/M visit and does not include any procedures that may have been performed.     See ACP Note from today for additional billable service: No    Mardy Abe, MD  Hospice/Palliative Care

## 2024-05-10 NOTE — Plan of Care (Signed)
 Pt is RASS -1 to -0, able to follow commands intermittently, MAP >65, O2 Sats >90% on the vent. Pt Afib.  Afebrile. No c/o pain. External Foley in place with diminished UOP. Pt has FMS in place; Last Bm 1209/2025. Pt's tube feeds restarted and at goal. Q2H turns. Eloquis for Afib & DVT prophylaxis. Family at bedside/updated. All monitors with appropriate alarm settings. Please see MAR and flowsheet for more detail.      Contacted MD team to instill TPA for sluggish PICC lumen to prevent lumen occlusion. TPA ordered and dwelled for 2 hours.     Problem: Skin Injury Risk Increased  Goal: Skin Health and Integrity  Intervention: Optimize Skin Protection  Recent Flowsheet Documentation  Taken 05/10/2024 1600 by Rosana Damien RAMAN, RN  Activity Management: in bed  Pressure Reduction Techniques: heels elevated off bed  Head of Bed [facility]) Positioning: HOB at 30-45 degrees  Taken 05/10/2024 1400 by Rosana Damien RAMAN, RN  Activity Management: in bed  Pressure Reduction Techniques: heels elevated off bed  Head of Bed [facility]) Positioning: HOB at 30-45 degrees  Taken 05/10/2024 1200 by Rosana Damien RAMAN, RN  Activity Management: in bed  Pressure Reduction Techniques: heels elevated off bed  Head of Bed [facility]) Positioning: HOB at 30-45 degrees  Taken 05/10/2024 1000 by Rosana Damien RAMAN, RN  Activity Management: in bed  Pressure Reduction Techniques: heels elevated off bed  Head of Bed [facility]) Positioning: HOB at 30-45 degrees  Taken 05/10/2024 0800 by Rosana Damien RAMAN, RN  Activity Management: in bed  Pressure Reduction Techniques:   heels elevated off bed   positioned off wounds   pressure points protected   frequent weight shift encouraged  Head of Bed (HOB) Positioning: HOB at 30-45 degrees  Pressure Reduction Devices:   elbow protectors utilized   foam padding utilized   heel offloading device utilized  Skin Protection:   incontinence pads utilized   protective footwear used   silicone foam dressing in place     Problem: Adult Inpatient Plan of Care  Goal: Absence of Hospital-Acquired Illness or Injury  Intervention: Identify and Manage Fall Risk  Recent Flowsheet Documentation  Taken 05/10/2024 0800 by Rosana Damien RAMAN, RN  Safety Interventions:   low bed   aspiration precautions   bed alarm  Intervention: Prevent Skin Injury  Recent Flowsheet Documentation  Taken 05/10/2024 1600 by Rosana Damien RAMAN, RN  Positioning for Skin: Right  Taken 05/10/2024 1400 by Rosana Damien RAMAN, RN  Positioning for Skin: Left  Taken 05/10/2024 1200 by Rosana Damien RAMAN, RN  Positioning for Skin: Right  Taken 05/10/2024 1000 by Rosana Damien RAMAN, RN  Positioning for Skin: Left  Taken 05/10/2024 0800 by Rosana Damien RAMAN, RN  Positioning for Skin: Right  Device Skin Pressure Protection:   absorbent pad utilized/changed   skin-to-skin areas padded   skin-to-device areas padded   pressure points protected   positioning supports utilized   tubing/devices free from skin contact  Skin Protection:   incontinence pads utilized   protective footwear used   silicone foam dressing in place  Intervention: Prevent and Manage VTE (Venous Thromboembolism) Risk  Recent Flowsheet Documentation  Taken 05/10/2024 1600 by Rosana Damien RAMAN, RN  Anti-Embolism Device Type: SCD, Knee  Anti-Embolism Device Status: (Eloquis) On  Anti-Embolism Device Location: BLE  Taken 05/10/2024 1400 by Rosana Damien RAMAN, RN  Anti-Embolism Device Type: SCD, Knee  Anti-Embolism Device Status: (Eloquis) On  Anti-Embolism Device Location: BLE  Taken 05/10/2024 1200 by  Rosana Damien RAMAN, RN  Anti-Embolism Device Type: SCD, Knee  Anti-Embolism Device Status: (Eloquis) On  Anti-Embolism Device Location: BLE  Taken 05/10/2024 1000 by Rosana Damien RAMAN, RN  Anti-Embolism Device Type: SCD, Knee  Anti-Embolism Device Status: (Eloquis) On  Anti-Embolism Device Location: BLE  Taken 05/10/2024 0800 by Rosana Damien RAMAN, RN  Anti-Embolism Device Type: SCD, Knee  Anti-Embolism Device Status: (Eloquis) On  Anti-Embolism Device Location: BLE  Intervention: Prevent Infection  Recent Flowsheet Documentation  Taken 05/10/2024 0800 by Rosana Damien RAMAN, RN  Infection Prevention:   hand hygiene promoted   personal protective equipment utilized   equipment surfaces disinfected     Problem: Breathing Pattern Ineffective  Goal: Effective Breathing Pattern  Intervention: Promote Improved Breathing Pattern  Recent Flowsheet Documentation  Taken 05/10/2024 1600 by Rosana Damien RAMAN, RN  Head of Bed [facility]) Positioning: HOB at 30-45 degrees  Airway/Ventilation Management: airway patency maintained  Taken 05/10/2024 1400 by Rosana Damien RAMAN, RN  Head of Bed [facility]) Positioning: HOB at 30-45 degrees  Taken 05/10/2024 1200 by Rosana Damien RAMAN, RN  Head of Bed [facility]) Positioning: HOB at 30-45 degrees  Airway/Ventilation Management: airway patency maintained  Taken 05/10/2024 1000 by Rosana Damien RAMAN, RN  Head of Bed [facility]) Positioning: HOB at 30-45 degrees  Taken 05/10/2024 0800 by Rosana Damien RAMAN, RN  Head of Bed [facility]) Positioning: HOB at 30-45 degrees  Airway/Ventilation Management: airway patency maintained     Problem: Fall Injury Risk  Goal: Absence of Fall and Fall-Related Injury  Intervention: Promote Injury-Free Environment  Recent Flowsheet Documentation  Taken 05/10/2024 0800 by Rosana Damien RAMAN, RN  Safety Interventions:   low bed   aspiration precautions   bed alarm     Problem: Gas Exchange Impaired  Goal: Optimal Gas Exchange  Intervention: Optimize Oxygenation and Ventilation  Recent Flowsheet Documentation  Taken 05/10/2024 1600 by Rosana Damien RAMAN, RN  Head of Bed [facility]) Positioning: HOB at 30-45 degrees  Airway/Ventilation Management: airway patency maintained  Taken 05/10/2024 1400 by Rosana Damien RAMAN, RN  Head of Bed [facility]) Positioning: HOB at 30-45 degrees  Taken 05/10/2024 1200 by Rosana Damien RAMAN, RN  Head of Bed [facility]) Positioning: HOB at 30-45 degrees  Airway/Ventilation Management: airway patency maintained  Taken 05/10/2024 1000 by Rosana Damien RAMAN, RN  Head of Bed [facility]) Positioning: HOB at 30-45 degrees  Taken 05/10/2024 0800 by Rosana Damien RAMAN, RN  Head of Bed [facility]) Positioning: HOB at 30-45 degrees  Airway/Ventilation Management: airway patency maintained     Problem: Non-Violent Restraints  Intervention: Utilize least restrictive measures  Recent Flowsheet Documentation  Taken 05/10/2024 1800 by Rosana Damien RAMAN, RN  Less Restrictive Alternative:   1:1 patient care   Repositioning  Taken 05/10/2024 1600 by Rosana Damien RAMAN, RN  Less Restrictive Alternative:   1:1 patient care   Repositioning  Taken 05/10/2024 1400 by Rosana Damien RAMAN, RN  Less Restrictive Alternative:   1:1 patient care   Repositioning  Taken 05/10/2024 1200 by Rosana Damien RAMAN, RN  Less Restrictive Alternative:   1:1 patient care   Repositioning  Taken 05/10/2024 1000 by Rosana Damien RAMAN, RN  Less Restrictive Alternative:   1:1 patient care   Repositioning  Taken 05/10/2024 0800 by Rosana Damien RAMAN, RN  Less Restrictive Alternative:   1:1 patient care   Repositioning  Intervention: Patient Monitoring  Recent Flowsheet Documentation  Taken 05/10/2024 1800 by Rosana Damien RAMAN, RN  Psychological Status/Visual Check: Subdued  Circulation/Skin Integrity: No signs  of injury  Range of Motion: Performed  Fluids: NPO  Food/Meal: NPO  Elimination: Incontinent/patient changed  Taken 05/10/2024 1600 by Rosana Damien RAMAN, RN  Psychological Status/Visual Check: Subdued  Circulation/Skin Integrity: No signs of injury  Range of Motion: Performed  Fluids: NPO  Food/Meal: NPO  Elimination: Incontinent/patient changed  Taken 05/10/2024 1400 by Rosana Damien RAMAN, RN  Psychological Status/Visual Check: Subdued  Circulation/Skin Integrity: No signs of injury  Range of Motion: Performed  Fluids: NPO  Food/Meal: NPO  Elimination: Incontinent/patient changed  Taken 05/10/2024 1200 by Rosana Damien RAMAN, RN  Psychological Status/Visual Check: Subdued  Circulation/Skin Integrity: No signs of injury  Range of Motion: Performed  Fluids: NPO  Food/Meal: NPO  Elimination: Incontinent/patient changed  Taken 05/10/2024 1000 by Rosana Damien RAMAN, RN  Psychological Status/Visual Check: Subdued  Circulation/Skin Integrity: No signs of injury  Range of Motion: Performed  Fluids: NPO  Food/Meal: NPO  Elimination: Incontinent/patient changed  Taken 05/10/2024 0800 by Rosana Damien RAMAN, RN  Psychological Status/Visual Check: Subdued  Circulation/Skin Integrity: No signs of injury  Range of Motion: Performed  Fluids: NPO  Food/Meal: NPO  Elimination: Incontinent/patient changed  Intervention: Patient Education  Recent Flowsheet Documentation  Taken 05/10/2024 1600 by Rosana Damien RAMAN, RN  Criteria Explained: Yes  Patient's Response: Patient Unable to respond  Family Notification: (Daughtr) Other  Taken 05/10/2024 0800 by Rosana Damien RAMAN, RN  Criteria Explained: Yes  Patient's Response: Patient Unable to respond     Problem: Wound  Goal: Optimal Functional Ability  Intervention: Optimize Functional Ability  Recent Flowsheet Documentation  Taken 05/10/2024 1600 by Rosana Damien RAMAN, RN  Activity Management: in bed  Taken 05/10/2024 1400 by Rosana Damien RAMAN, RN  Activity Management: in bed  Taken 05/10/2024 1200 by Rosana Damien RAMAN, RN  Activity Management: in bed  Taken 05/10/2024 1000 by Rosana Damien RAMAN, RN  Activity Management: in bed  Taken 05/10/2024 0800 by Rosana Damien RAMAN, RN  Activity Management: in bed  Goal: Absence of Infection Signs and Symptoms  Intervention: Prevent or Manage Infection  Recent Flowsheet Documentation  Taken 05/10/2024 0800 by Rosana Damien RAMAN, RN  Infection Management: aseptic technique maintained  Goal: Optimal Pain Control and Function  Intervention: Prevent or Manage Pain  Recent Flowsheet Documentation  Taken 05/10/2024 0800 by Rosana Damien RAMAN, RN  Sleep/Rest Enhancement: relaxation techniques promoted  Goal: Skin Health and Integrity  Intervention: Optimize Skin Protection  Recent Flowsheet Documentation  Taken 05/10/2024 1600 by Rosana Damien RAMAN, RN  Activity Management: in bed  Pressure Reduction Techniques: heels elevated off bed  Head of Bed [facility]) Positioning: HOB at 30-45 degrees  Taken 05/10/2024 1400 by Rosana Damien RAMAN, RN  Activity Management: in bed  Pressure Reduction Techniques: heels elevated off bed  Head of Bed [facility]) Positioning: HOB at 30-45 degrees  Taken 05/10/2024 1200 by Rosana Damien RAMAN, RN  Activity Management: in bed  Pressure Reduction Techniques: heels elevated off bed  Head of Bed [facility]) Positioning: HOB at 30-45 degrees  Taken 05/10/2024 1000 by Rosana Damien RAMAN, RN  Activity Management: in bed  Pressure Reduction Techniques: heels elevated off bed  Head of Bed [facility]) Positioning: HOB at 30-45 degrees  Taken 05/10/2024 0800 by Rosana Damien RAMAN, RN  Activity Management: in bed  Pressure Reduction Techniques:   heels elevated off bed   positioned off wounds   pressure points protected   frequent weight shift encouraged  Head of Bed (HOB) Positioning: HOB at 30-45 degrees  Pressure  Reduction Devices:   elbow protectors utilized   foam padding utilized   heel offloading device utilized  Skin Protection:   incontinence pads utilized   protective footwear used   silicone foam dressing in place  Goal: Optimal Wound Healing  Intervention: Promote Wound Healing  Recent Flowsheet Documentation  Taken 05/10/2024 0800 by Rosana Damien RAMAN, RN  Sleep/Rest Enhancement: relaxation techniques promoted     Problem: Mechanical Ventilation Invasive  Goal: Optimal Device Function  Intervention: Optimize Device Care and Function  Recent Flowsheet Documentation  Taken 05/10/2024 1600 by Rosana Damien RAMAN, RN  Airway/Ventilation Management: airway patency maintained  Oral Care:   mouth swabbed   lip/mouth moisturizer applied  Taken 05/10/2024 1200 by Rosana Damien RAMAN, RN  Airway/Ventilation Management: airway patency maintained  Oral Care:   mouth swabbed   lip/mouth moisturizer applied  Taken 05/10/2024 0800 by Rosana Damien RAMAN, RN  Airway/Ventilation Management: airway patency maintained  Oral Care:   lip/mouth moisturizer applied   mouth swabbed   oral rinse provided   suction provided  Goal: Mechanical Ventilation Liberation  Intervention: Promote Extubation and Tour Manager Liberation  Recent Flowsheet Documentation  Taken 05/10/2024 0800 by Rosana Damien RAMAN, RN  Sleep/Rest Enhancement: relaxation techniques promoted  Goal: Absence of Device-Related Skin and Tissue Injury  Intervention: Maintain Skin and Tissue Health  Recent Flowsheet Documentation  Taken 05/10/2024 0800 by Rosana Damien RAMAN, RN  Device Skin Pressure Protection:   absorbent pad utilized/changed   skin-to-skin areas padded   skin-to-device areas padded   pressure points protected   positioning supports utilized   tubing/devices free from skin contact  Goal: Absence of Ventilator-Induced Lung Injury  Intervention: Prevent Ventilator-Associated Pneumonia  Recent Flowsheet Documentation  Taken 05/10/2024 1600 by Rosana Damien RAMAN, RN  Head of Bed [facility]) Positioning: HOB at 30-45 degrees  Oral Care:   mouth swabbed   lip/mouth moisturizer applied  Taken 05/10/2024 1400 by Rosana Damien RAMAN, RN  Head of Bed [facility]) Positioning: HOB at 30-45 degrees  Taken 05/10/2024 1200 by Rosana Damien RAMAN, RN  Head of Bed [facility]) Positioning: HOB at 30-45 degrees  Oral Care:   mouth swabbed   lip/mouth moisturizer applied  Taken 05/10/2024 1000 by Rosana Damien RAMAN, RN  Head of Bed [facility]) Positioning: HOB at 30-45 degrees  Taken 05/10/2024 0800 by Rosana Damien RAMAN, RN  Head of Bed [facility]) Positioning: HOB at 30-45 degrees  Oral Care:   lip/mouth moisturizer applied   mouth swabbed   oral rinse provided   suction provided     Problem: Mechanical Ventilation Invasive  Goal: Optimal Device Function  Intervention: Optimize Device Care and Function  Recent Flowsheet Documentation  Taken 05/10/2024 1600 by Rosana Damien RAMAN, RN  Airway/Ventilation Management: airway patency maintained  Oral Care:   mouth swabbed   lip/mouth moisturizer applied  Taken 05/10/2024 1200 by Rosana Damien RAMAN, RN  Airway/Ventilation Management: airway patency maintained  Oral Care:   mouth swabbed   lip/mouth moisturizer applied  Taken 05/10/2024 0800 by Rosana Damien RAMAN, RN  Airway/Ventilation Management: airway patency maintained  Oral Care:   lip/mouth moisturizer applied   mouth swabbed   oral rinse provided   suction provided  Goal: Mechanical Ventilation Liberation  Intervention: Promote Extubation and Tour Manager Liberation  Recent Flowsheet Documentation  Taken 05/10/2024 0800 by Rosana Damien RAMAN, RN  Sleep/Rest Enhancement: relaxation techniques promoted  Goal: Absence of Device-Related Skin and Tissue Injury  Intervention: Maintain Skin and Tissue Health  Recent Flowsheet  Documentation  Taken 05/10/2024 0800 by Rosana Damien RAMAN, RN  Device Skin Pressure Protection:   absorbent pad utilized/changed   skin-to-skin areas padded   skin-to-device areas padded   pressure points protected   positioning supports utilized   tubing/devices free from skin contact  Goal: Absence of Ventilator-Induced Lung Injury  Intervention: Prevent Ventilator-Associated Pneumonia  Recent Flowsheet Documentation  Taken 05/10/2024 1600 by Rosana Damien RAMAN, RN  Head of Bed [facility]) Positioning: HOB at 30-45 degrees  Oral Care:   mouth swabbed   lip/mouth moisturizer applied  Taken 05/10/2024 1400 by Rosana Damien RAMAN, RN  Head of Bed [facility]) Positioning: HOB at 30-45 degrees  Taken 05/10/2024 1200 by Rosana Damien RAMAN, RN  Head of Bed [name]) Positioning: HOB at 30-45 degrees  Oral Care:   mouth swabbed   lip/mouth moisturizer applied  Taken 05/10/2024 1000 by Rosana Damien RAMAN, RN  Head of Bed [facility]) Positioning: HOB at 30-45 degrees  Taken 05/10/2024 0800 by Rosana Damien RAMAN, RN  Head of Bed [facility]) Positioning: HOB at 30-45 degrees  Oral Care:   lip/mouth moisturizer applied   mouth swabbed   oral rinse provided   suction provided     Problem: Mechanical Ventilation Invasive  Goal: Optimal Device Function  Intervention: Optimize Device Care and Function  Recent Flowsheet Documentation  Taken 05/10/2024 1600 by Rosana Damien RAMAN, RN  Airway/Ventilation Management: airway patency maintained  Oral Care:   mouth swabbed   lip/mouth moisturizer applied  Taken 05/10/2024 1200 by Rosana Damien RAMAN, RN  Airway/Ventilation Management: airway patency maintained  Oral Care:   mouth swabbed   lip/mouth moisturizer applied  Taken 05/10/2024 0800 by Rosana Damien RAMAN, RN  Airway/Ventilation Management: airway patency maintained  Oral Care:   lip/mouth moisturizer applied   mouth swabbed   oral rinse provided   suction provided  Goal: Mechanical Ventilation Liberation  Intervention: Promote Extubation and Tour Manager Liberation  Recent Flowsheet Documentation  Taken 05/10/2024 0800 by Rosana Damien RAMAN, RN  Sleep/Rest Enhancement: relaxation techniques promoted  Goal: Absence of Device-Related Skin and Tissue Injury  Intervention: Maintain Skin and Tissue Health  Recent Flowsheet Documentation  Taken 05/10/2024 0800 by Rosana Damien RAMAN, RN  Device Skin Pressure Protection:   absorbent pad utilized/changed   skin-to-skin areas padded   skin-to-device areas padded   pressure points protected   positioning supports utilized   tubing/devices free from skin contact  Goal: Absence of Ventilator-Induced Lung Injury  Intervention: Prevent Ventilator-Associated Pneumonia  Recent Flowsheet Documentation  Taken 05/10/2024 1600 by Rosana Damien RAMAN, RN  Head of Bed [facility]) Positioning: HOB at 30-45 degrees  Oral Care:   mouth swabbed   lip/mouth moisturizer applied  Taken 05/10/2024 1400 by Rosana Damien RAMAN, RN  Head of Bed [facility]) Positioning: HOB at 30-45 degrees  Taken 05/10/2024 1200 by Rosana Damien RAMAN, RN  Head of Bed [facility]) Positioning: HOB at 30-45 degrees  Oral Care:   mouth swabbed   lip/mouth moisturizer applied  Taken 05/10/2024 1000 by Rosana Damien RAMAN, RN  Head of Bed [facility]) Positioning: HOB at 30-45 degrees  Taken 05/10/2024 0800 by Rosana Damien RAMAN, RN  Head of Bed [facility]) Positioning: HOB at 30-45 degrees  Oral Care:   lip/mouth moisturizer applied   mouth swabbed   oral rinse provided   suction provided     Problem: Mechanical Ventilation Invasive  Goal: Optimal Device Function  Intervention: Optimize Device Care and Function  Recent Flowsheet Documentation  Taken 05/10/2024 1600 by  Rosana Damien RAMAN, RN  Airway/Ventilation Management: airway patency maintained  Oral Care:   mouth swabbed   lip/mouth moisturizer applied  Taken 05/10/2024 1200 by Rosana Damien RAMAN, RN  Airway/Ventilation Management: airway patency maintained  Oral Care:   mouth swabbed   lip/mouth moisturizer applied  Taken 05/10/2024 0800 by Rosana Damien RAMAN, RN  Airway/Ventilation Management: airway patency maintained  Oral Care:   lip/mouth moisturizer applied   mouth swabbed   oral rinse provided   suction provided  Goal: Mechanical Ventilation Liberation  Intervention: Promote Extubation and Mechanical Ventilation Liberation  Recent Flowsheet Documentation  Taken 05/10/2024 0800 by Rosana Damien RAMAN, RN  Sleep/Rest Enhancement: relaxation techniques promoted  Goal: Absence of Device-Related Skin and Tissue Injury  Intervention: Maintain Skin and Tissue Health  Recent Flowsheet Documentation  Taken 05/10/2024 0800 by Rosana Damien RAMAN, RN  Device Skin Pressure Protection:   absorbent pad utilized/changed   skin-to-skin areas padded   skin-to-device areas padded   pressure points protected   positioning supports utilized   tubing/devices free from skin contact  Goal: Absence of Ventilator-Induced Lung Injury  Intervention: Prevent Ventilator-Associated Pneumonia  Recent Flowsheet Documentation  Taken 05/10/2024 1600 by Rosana Damien RAMAN, RN  Head of Bed [facility]) Positioning: HOB at 30-45 degrees  Oral Care:   mouth swabbed   lip/mouth moisturizer applied  Taken 05/10/2024 1400 by Rosana Damien RAMAN, RN  Head of Bed [facility]) Positioning: HOB at 30-45 degrees  Taken 05/10/2024 1200 by Rosana Damien RAMAN, RN  Head of Bed [facility]) Positioning: HOB at 30-45 degrees  Oral Care:   mouth swabbed   lip/mouth moisturizer applied  Taken 05/10/2024 1000 by Rosana Damien RAMAN, RN  Head of Bed [facility]) Positioning: HOB at 30-45 degrees  Taken 05/10/2024 0800 by Rosana Damien RAMAN, RN  Head of Bed [facility]) Positioning: HOB at 30-45 degrees  Oral Care:   lip/mouth moisturizer applied   mouth swabbed   oral rinse provided   suction provided     Problem: Infection  Goal: Absence of Infection Signs and Symptoms  Intervention: Prevent or Manage Infection  Recent Flowsheet Documentation  Taken 05/10/2024 0800 by Rosana Damien RAMAN, RN  Infection Management: aseptic technique maintained     Problem: Mechanical Ventilation Invasive  Goal: Optimal Device Function  Intervention: Optimize Device Care and Function  Recent Flowsheet Documentation  Taken 05/10/2024 1600 by Rosana Damien RAMAN, RN  Airway/Ventilation Management: airway patency maintained  Oral Care:   mouth swabbed   lip/mouth moisturizer applied  Taken 05/10/2024 1200 by Rosana Damien RAMAN, RN  Airway/Ventilation Management: airway patency maintained  Oral Care:   mouth swabbed   lip/mouth moisturizer applied  Taken 05/10/2024 0800 by Rosana Damien RAMAN, RN  Airway/Ventilation Management: airway patency maintained  Oral Care:   lip/mouth moisturizer applied   mouth swabbed   oral rinse provided   suction provided  Goal: Mechanical Ventilation Liberation  Intervention: Promote Extubation and Tour Manager Liberation  Recent Flowsheet Documentation  Taken 05/10/2024 0800 by Rosana Damien RAMAN, RN  Sleep/Rest Enhancement: relaxation techniques promoted  Goal: Absence of Device-Related Skin and Tissue Injury  Intervention: Maintain Skin and Tissue Health  Recent Flowsheet Documentation  Taken 05/10/2024 0800 by Rosana Damien RAMAN, RN  Device Skin Pressure Protection:   absorbent pad utilized/changed   skin-to-skin areas padded   skin-to-device areas padded   pressure points protected   positioning supports utilized   tubing/devices free from skin contact  Goal: Absence of Ventilator-Induced Lung Injury  Intervention: Prevent  Ventilator-Associated Pneumonia  Recent Flowsheet Documentation  Taken 05/10/2024 1600 by Rosana Damien RAMAN, RN  Head of Bed [facility]) Positioning: HOB at 30-45 degrees  Oral Care:   mouth swabbed   lip/mouth moisturizer applied  Taken 05/10/2024 1400 by Rosana Damien RAMAN, RN  Head of Bed [facility]) Positioning: HOB at 30-45 degrees  Taken 05/10/2024 1200 by Rosana Damien RAMAN, RN  Head of Bed [facility]) Positioning: HOB at 30-45 degrees  Oral Care:   mouth swabbed   lip/mouth moisturizer applied  Taken 05/10/2024 1000 by Rosana Damien RAMAN, RN  Head of Bed [facility]) Positioning: HOB at 30-45 degrees  Taken 05/10/2024 0800 by Rosana Damien RAMAN, RN  Head of Bed [facility]) Positioning: HOB at 30-45 degrees  Oral Care:   lip/mouth moisturizer applied   mouth swabbed   oral rinse provided   suction provided     Problem: Mechanical Ventilation Invasive  Goal: Optimal Device Function  Intervention: Optimize Device Care and Function  Recent Flowsheet Documentation  Taken 05/10/2024 1600 by Rosana Damien RAMAN, RN  Airway/Ventilation Management: airway patency maintained  Oral Care:   mouth swabbed   lip/mouth moisturizer applied  Taken 05/10/2024 1200 by Rosana Damien RAMAN, RN  Airway/Ventilation Management: airway patency maintained  Oral Care:   mouth swabbed   lip/mouth moisturizer applied  Taken 05/10/2024 0800 by Rosana Damien RAMAN, RN  Airway/Ventilation Management: airway patency maintained  Oral Care:   lip/mouth moisturizer applied   mouth swabbed   oral rinse provided   suction provided  Goal: Mechanical Ventilation Liberation  Intervention: Promote Extubation and Tour Manager Liberation  Recent Flowsheet Documentation  Taken 05/10/2024 0800 by Rosana Damien RAMAN, RN  Sleep/Rest Enhancement: relaxation techniques promoted  Goal: Absence of Device-Related Skin and Tissue Injury  Intervention: Maintain Skin and Tissue Health  Recent Flowsheet Documentation  Taken 05/10/2024 0800 by Rosana Damien RAMAN, RN  Device Skin Pressure Protection:   absorbent pad utilized/changed   skin-to-skin areas padded   skin-to-device areas padded   pressure points protected   positioning supports utilized   tubing/devices free from skin contact  Goal: Absence of Ventilator-Induced Lung Injury  Intervention: Prevent Ventilator-Associated Pneumonia  Recent Flowsheet Documentation  Taken 05/10/2024 1600 by Rosana Damien RAMAN, RN  Head of Bed [facility]) Positioning: HOB at 30-45 degrees  Oral Care:   mouth swabbed   lip/mouth moisturizer applied  Taken 05/10/2024 1400 by Rosana Damien RAMAN, RN  Head of Bed [facility]) Positioning: HOB at 30-45 degrees  Taken 05/10/2024 1200 by Rosana Damien RAMAN, RN  Head of Bed [facility]) Positioning: HOB at 30-45 degrees  Oral Care:   mouth swabbed   lip/mouth moisturizer applied  Taken 05/10/2024 1000 by Rosana Damien RAMAN, RN  Head of Bed [facility]) Positioning: HOB at 30-45 degrees  Taken 05/10/2024 0800 by Rosana Damien RAMAN, RN  Head of Bed [facility]) Positioning: HOB at 30-45 degrees  Oral Care:   lip/mouth moisturizer applied   mouth swabbed   oral rinse provided   suction provided     Problem: Artificial Airway  Goal: Optimal Device Function  Intervention: Optimize Device Care and Function  Recent Flowsheet Documentation  Taken 05/10/2024 1600 by Rosana Damien RAMAN, RN  Airway/Ventilation Management: airway patency maintained  Oral Care:   mouth swabbed   lip/mouth moisturizer applied  Taken 05/10/2024 1200 by Rosana Damien RAMAN, RN  Airway/Ventilation Management: airway patency maintained  Oral Care:   mouth swabbed   lip/mouth moisturizer applied  Taken 05/10/2024 0800 by Rosana Damien RAMAN, RN  Airway/Ventilation Management: airway patency maintained  Aspiration Precautions: upright posture maintained  Oral Care:   lip/mouth moisturizer applied   mouth swabbed   oral rinse provided   suction provided  Goal: Absence of Device-Related Skin or Tissue Injury  Intervention: Maintain Skin and Tissue Health  Recent Flowsheet Documentation  Taken 05/10/2024 0800 by Rosana Damien RAMAN, RN  Device Skin Pressure Protection:   absorbent pad utilized/changed   skin-to-skin areas padded   skin-to-device areas padded   pressure points protected   positioning supports utilized   tubing/devices free from skin contact     Problem: Mechanical Ventilation Invasive  Goal: Optimal Device Function  Intervention: Optimize Device Care and Function  Recent Flowsheet Documentation  Taken 05/10/2024 1600 by Rosana Damien RAMAN, RN  Airway/Ventilation Management: airway patency maintained  Oral Care:   mouth swabbed   lip/mouth moisturizer applied  Taken 05/10/2024 1200 by Rosana Damien RAMAN, RN  Airway/Ventilation Management: airway patency maintained  Oral Care:   mouth swabbed   lip/mouth moisturizer applied  Taken 05/10/2024 0800 by Rosana Damien RAMAN, RN  Airway/Ventilation Management: airway patency maintained  Oral Care:   lip/mouth moisturizer applied   mouth swabbed   oral rinse provided   suction provided  Goal: Mechanical Ventilation Liberation  Intervention: Promote Extubation and Tour Manager Liberation  Recent Flowsheet Documentation  Taken 05/10/2024 0800 by Rosana Damien RAMAN, RN  Sleep/Rest Enhancement: relaxation techniques promoted  Goal: Absence of Device-Related Skin and Tissue Injury  Intervention: Maintain Skin and Tissue Health  Recent Flowsheet Documentation  Taken 05/10/2024 0800 by Rosana Damien RAMAN, RN  Device Skin Pressure Protection:   absorbent pad utilized/changed   skin-to-skin areas padded   skin-to-device areas padded   pressure points protected   positioning supports utilized   tubing/devices free from skin contact  Goal: Absence of Ventilator-Induced Lung Injury  Intervention: Prevent Ventilator-Associated Pneumonia  Recent Flowsheet Documentation  Taken 05/10/2024 1600 by Rosana Damien RAMAN, RN  Head of Bed [facility]) Positioning: HOB at 30-45 degrees  Oral Care:   mouth swabbed   lip/mouth moisturizer applied  Taken 05/10/2024 1400 by Rosana Damien RAMAN, RN  Head of Bed [facility]) Positioning: HOB at 30-45 degrees  Taken 05/10/2024 1200 by Rosana Damien RAMAN, RN  Head of Bed [facility]) Positioning: HOB at 30-45 degrees  Oral Care:   mouth swabbed   lip/mouth moisturizer applied  Taken 05/10/2024 1000 by Rosana Damien RAMAN, RN  Head of Bed [facility]) Positioning: HOB at 30-45 degrees  Taken 05/10/2024 0800 by Rosana Damien RAMAN, RN  Head of Bed [facility]) Positioning: HOB at 30-45 degrees  Oral Care:   lip/mouth moisturizer applied   mouth swabbed   oral rinse provided   suction provided     Problem: Artificial Airway  Goal: Optimal Device Function  Intervention: Optimize Device Care and Function  Recent Flowsheet Documentation  Taken 05/10/2024 1600 by Rosana Damien RAMAN, RN  Airway/Ventilation Management: airway patency maintained  Oral Care:   mouth swabbed   lip/mouth moisturizer applied  Taken 05/10/2024 1200 by Rosana Damien RAMAN, RN  Airway/Ventilation Management: airway patency maintained  Oral Care:   mouth swabbed   lip/mouth moisturizer applied  Taken 05/10/2024 0800 by Rosana Damien RAMAN, RN  Airway/Ventilation Management: airway patency maintained  Aspiration Precautions: upright posture maintained  Oral Care:   lip/mouth moisturizer applied   mouth swabbed   oral rinse provided   suction provided  Goal: Absence of Device-Related Skin or Tissue Injury  Intervention: Maintain Skin and Tissue  Health  Recent Flowsheet Documentation  Taken 05/10/2024 0800 by Rosana Damien RAMAN, RN  Device Skin Pressure Protection:   absorbent pad utilized/changed   skin-to-skin areas padded   skin-to-device areas padded   pressure points protected   positioning supports utilized   tubing/devices free from skin contact

## 2024-05-10 NOTE — Progress Notes (Signed)
 MICU Daily Progress Note     Date of Service: 05/10/2024    Problem List:   Principal Problem:    Bacteremia, escherichia coli  Active Problems:    Alcoholic liver disease (HHS-HCC)    HTN (hypertension)    Depression with anxiety    Class 2 severe obesity with serious comorbidity in adult    Atrial fibrillation    (CMS-HCC)    Pleural effusion    Hypernatremia    Thrombocytopenia    Cirrhosis    (CMS-HCC)    A-fib (CMS-HCC)    Hepatic encephalopathy    (CMS-HCC)    Anaphylaxis      Interval history: [name] is a 78 y.o. female with HTN, MetALD cirrhosis, severe obseity, and persistent a fib who presented to [facility] initially for encephalopathy following an aborted DCCV (LAA thrombus found). She subsequently had a complex hospital stay with multiple ICU admissions, PEA arrest, multiple intubations and eventually tracheostomy. Currently weaning trach, and providing NGT feeds, but mental status continues to be poor. She was admitted to the MICU for hypotension requiring norepinephrine .     Rapid was called for hypotension on May 28, 2024 where patient had BP 70s/20s.  On exam, she was at her baseline (opening her eyes to her name, grimacing to voice, moving all extremities to painful stimuli).  During these events she was given IVF (to which she did not respond) and ultimately started on norepinephrine .  She underwent additional sepsis workup 12/7 which showed a trach culture with Pseudomonas that is likely similar sensitivities to previous isolate.      In the MICU on 2024/05/28 vancomycin  was transitioned to linezolid .    **Per daughter, she does yet not want to have her mother told that her husband passed away in hospice on 05-28-2024**    24-hr update: As above    Neurological   Encephalopathy, multifactorial - Focal non-convulsive seizures  Multiple drivers, prolonged ICU stays, cardiac arrest, metabolic derangements have been corrected, likely some hepatic encephalopathy. Did have subclinical seizures seen on EEG earlier in her course as well. Cefepime  also possible contributing. Avoiding other centrally acting medications, though seems to be tolerating low dose infrequent oxycodone . Intermittently opening eyes to verbal and tactile stimuli - 05-28-2024 she did so, but is otherwise not interacting.  Of note, has become more encephalopathic in the past if she does not have adequate bowel movements.  - Continue Lactulose , only hold if >1080ml stool via FMS  - Continue Rifaximin  550 mg twice daily  - Ongoing re-evalaution especially with family at bedside as able  - Discussed with neuro, while MRI Brain 12/3 improved, and could have been 2/2 prolonged subclinical seizures, or PRES, however given lack of improvement in exam findings, toxic metabolic insults also likely contributed, and it is difficult to predict how much and within what time frame she will improve  - Continue Levetiracetam  750mg  BID and Lacosamide  100mg  BID  - Continue delirium precautions      Analgesia: Pain adequately controlled  RASS at goal? N/A, not on sedation  [place] Agitation Assessment Scale (RASS) : 0 (05/10/2024  8:00 AM)       Pulmonary   Chronic hypoxemic and hypercarbic respiratory failure - Trach/vent depedent - Apneic events  Initially suspected to be due to encephalopathy as above.  Multiple chest x-rays have shown pleural effusions.  Intubated 10/24 through 11/14. Failed extubation attempts and tracheostomy placed on 11/14.  While with hospitalist team, has had difficulty weaning pressure support and has  required very slow decreases as she has become hypercarbic with faster weans.  Given her apneic episodes and poor mechanics, mental status/encephalopathy appears to be driving vent dependence.  - Continue trials of CPAP  - Routine trach care per RT  - VBG qAM    S RR:  [12-16] 12  FiO2 (%):  [25 %-30 %] 25 %  S VT:  [350 mL] 350 mL  PC Set:  [15] 15  PR SUP:  [12 cm H20] 12 cm H20  O2 Device: Ventilator    Cardiovascular   Hypotension  Recurrent issue. Septic shock earlier in hospitalization (on transfer to MICU from [facility]). Seems to be sensitive to diuresis. Repeat episode of hypotension on 12/8 requiring pressors unlikely to be due to septic shock given off pressors within 24 hours.  - Off norepinephrine  gtt since 12/9 AM  - Consider albumin  or IV fluids for hypotension    Persistent AFib - LAA thrombus  Rate controlled with medication.  - Metop 25mg  BID  - Apixaban  5mg  BID     History of prolonged Qtc  - Regular EKGs  - Most recent EKG 12/5 with QTc Frederica 405    Right internal jugular / carotid fistula, resolved  Apparently thrombosed; no flow on carotid doppler 03/26/2024. Was related to central line injury   - When approaching discharge, discuss with vascular surgery whether any follow-up needed before discharge  -- strict intake / output    PEA arrest s/p ROSC  Patient with PEA arrest peri-intubation with ROSC prior to transfer from [facility].      Chronic diastolic heart failure (HFimpEF)  Not an active issue; edema unlikely cardiogenic in nature    Renal   Anasarca  Likely 2/2 prolonged critical illness, underlying cirrhosis, and hypoalbuminemia. RLE is worse than other limbs (though R side overall is more edematous).  Was receiving Lasix  while with hospitalist team.  Last Lasix  was 20 mg 12/7.  - Holding Lasix  12/8 ISO infection/fevering  - Of note, pulsatile vessel seen on neck on 12/7 that was resolved on 12/8.  Could have been secondary to volume overload.  - PVL BL carotid 12/8 pending  - Holding diuresis in setting of infection    Infectious Disease/Autoimmune   Fever - c/f Sepsis - RLE cellulitis  Began fevering mid-day 12/8. Source of infection leg wound (RLE cellulitis) vs trach culture. No volume resuscitation prior to transfer to MICU given stable VS in the morning and volume overloaded status. Received during rapid 12/8.  Previous lower respiratory culture 11/27 with MRSA and Pseudomonas (pan-susceptible).  Continuous to have increased secretions.  - Additional 1L LR s/p move to MICU  - Infectious workup 12/7              - Blood cultures NG 24hr              - Urine culture 12/7 no growth              - [facility] 12/7 (Tracheal aspirate) with Pseudomonas, remainder in progress  - CXR 12/8 with pleural effusions and possible consolidation that could represent pneumonia versus aspiration.  - CT RLE 12/9 with RLE cellulitis without underlying abscess or deep tracking soft tissue gas.   - Repeat infectious workup 12/8, follow-up:              - Blood cultures              - UA with reflex              -  RPP  - Continue linezolid  (12/8 - )  - Continue Zosyn  4.5 g q6h (12/7 - )    Ventilator associated pneumonia  Likely secondary to Pseudomonas as seen on lower respiratory culture and MSSA.  - Treated with 7-day course antibiotics through 12/3.  - CXR 12/8 with pleural effusions and possible consolidation that could represent pneumonia versus aspiration.   - Additional infectious workup as above    RLE cellulitis  Worsening erythema and edema despite overall improvement in anasarca. Nearly febrile overnight 12/4, and off abx x2 days, so c/f cellulitis flaring in that setting.  - BLE PVL 12/4 without signs of DVT  - CT RLE 12/9 with RLE cellulitis without underlying abscess or deep tracking soft tissue gas.   - Workup and antibiotics as above    History of E. coli bacteremia.  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  GLIM criteria:   Pt does not meet criteria  -I have screened this patient for malnutrition and they did NOT meet criteria for malnutrition based on GLIM criteria.  -Nutrition consulted yes, see RD assessment below  RD assessment:  Patient does not meet AND/ASPEN criteria for malnutrition at this time (03/16/24 1326)      - Consider reassessment by nutrition  - Tube feeds at goal.  FWF 100 mL q2h   - Depending on GOC, may require PEG.  Discussed with daughter by hospitalist team with plans to revisit early this week if required.       Heme/Coag   Anemia, chronic (stable)  - Initially secondary to blood loss (early in hospitalization)  - Stable  - CTM      Endocrine   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)   -  holding home anastrozole     Integumentary     #c/f RLE cellulitis  - See above    #Skin ulcer on elbow  - Wound care consulted  - Pending assessment    Prophylaxis/LDA/Restraints/Consults   ICU Checklist completed:  see ICU rounding navigator      Patient Lines/Drains/Airways Status       Active Active Lines, Drains, & Airways  Name Placement date Placement time Site Days    Tracheostomy Shiley 6 Cuffed 05/09/24  1327  6  1    NG/OG Tube Feedings 10 Fr. Right nostril 04/01/24  1138  Right nostril  39    External Urinary Device 04/13/24 With Suction 04/13/24  1700  -- 26    PICC Double Lumen 04/06/24 Left Basilic 04/06/24 1558  Basilic  33    Peripheral IV 88/79/74 Right Wrist 04/21/24  0830  Wrist  19    Peripheral IV 04/30/24 Distal;Posterior;Right Forearm 04/30/24  1832  Forearm  9                  Patient Lines/Drains/Airways Status       Active Wounds       Name Placement date Placement time Site Days    Wound 03/13/24 Irritant Contact Dermatitis Incontinence Sacrum Mid gluteal cleft MASD? 03/13/24  --  Sacrum  58    Wound 04/27/24 Traumatic Abrasion Pretibial Right 04/27/24  1500  Pretibial  12    Wound 05/09/24 Elbow Posterior;Right 05/09/24  0750  Elbow  1                    Goals of Care     GOC   Multiple prior ACP discussions. Appreciate palliative care input. Goal is for functional recovery, unfortunately mental status appears to be very waxing/waning and per dw neuro, prognosis is uncertain and will take a long time to recover if she ever does.   - S/p family meeting 12/4 over the phone, provided updates, daughter still wants to give her some time and would like to see her in person, unfortunately currently has a stomach bug  - Palliative care following, appreciate assistance in continuing to have ongoing GOC conversations.    Code Status:   Orders Placed This Encounter   Procedures    Full Code     Standing Status:   Standing     Number of Occurrences:   1        Designated Healthcare Decision Maker:  Ms. Scheidegger designated healthcare decision maker(s) is/are   HCDM (patient stated preference): Skellenger,[name] - Daughter - [phone]    HCDM (patient stated preference): Lingelbach,[name] - Spouse - [phone]. See HCDM section of Epic sidebar/storyboard or ACP tab in patient chart for details regarding active HCDMs and patient capacity for decision-making.      Subjective     Intubated.  Does not verbally respond to questions or commands. Does not follow commands to squeeze fingers or wiggle toes.    Objective     Vitals - past 24 hours  Temp:  [36.8 ??C (98.2 ??F)-37.2 ??C (98.9 ??F)] 37.2 ??C (98.9 ??F)  Pulse:  [78-112] 85  SpO2 Pulse:  [77-109] 84  Resp:  [13-29] 21  BP: (72-161)/(20-123) 108/41  FiO2 (%):  [25 %-30 %] 25 %  SpO2:  [94 %-100 %] 99 % Intake/Output  I/O last 3 completed shifts:  In: 4984.8 [I.V.:160.6; NG/GT:2220; IV Piggyback:2604.2]  Out: 2900 [Urine:1000; Stool:1900]     Physical Exam:    Gen: lying in bed, does not open eyes or react  HEENT: Dry MM, NGT in place with feeds running, trach in place, resists eye opening (by moving head).   CV: Regular rate, irregular rhythm, no MRG  Pulm: CTAB  anteriorly  Abd: soft, non-tender, normal BS  Ext: diffuse edema right > left of lower extremities. RLE erythematous and warm. LUE PICC in place  Neuro: opens eyes to name, but not on command. not following instructions. Reflexively wiggles toes when plantar surface of foot stimulated, withdrawals to stimuli.  Continuous Infusions:   Infusions Meds[1]    Scheduled Medications:   Scheduled Medications[2]    PRN medications:  PRN Medications[3]    Data/Imaging Review: Reviewed in Epic and personally interpreted on 05/10/2024. See EMR for detailed results.    Honora Sells, MD  Internal Medicine PGY1             [1]    NORepinephrine  bitartrate-NS Stopped (05/10/24 0306)   [2]    apixaban   5 mg Enteral tube: gastric BID    cyanocobalamin  (vitamin B-12)  1,000 mcg Enteral tube: gastric Daily    ergocalciferol   100 mcg Enteral tube: gastric Daily    famotidine   20 mg Enteral tube: gastric Daily    flu vac 2025 [id number](PF)  0.5 mL Intramuscular During hospitalization    folic acid   1 mg Enteral tube: gastric Daily    lacosamide   100 mg Enteral tube: gastric BID    lactulose   30 g Enteral tube: gastric TID    levETIRAcetam   750 mg Enteral tube: gastric BID    linezolid   600 mg Enteral tube: gastric Q12H    melatonin  3 mg Enteral tube: gastric QPM    metoPROLOL  tartrate  25 mg Enteral tube: gastric BID    multivitamins (ADULT)  1 tablet Enteral tube: gastric Daily    piperacillin -tazobactam (ZOSYN ) IV (intermittent)  4.5 g Intravenous Q8H [facility]    rifAXIMin   550 mg Enteral tube: gastric BID    sodium chloride   10 mL Intravenous Q8H    sodium chloride   10 mL Intravenous Q8H    thiamine  mononitrate (vit B1)  100 mg Enteral tube: gastric Daily   [3] acetaminophen , alteplase 

## 2024-05-10 NOTE — Progress Notes (Signed)
 ICU TRANSPORT NOTE    Destination: CT    Departing Unit: MICU  Pickup Time: 0435    Return Unit: MICU  Return Time: 0515    [facility] patient ID band verified  Allergies Reviewed  Code Status at time of transport: Full    Report received from primary nurse via [employer]. Handoff performed of continuous drip/infusion Patient transported via stretcher under ICU level of care. See vital signs during transport via [employer]. O2 via Ventilator @ 30 %. Patient is sedated and unable to follow commands. Patient tolerated procedure/scan well. Universal and Aspiration precautions maintained throughout transport.    Update and care given to primary nurse. See Doc Flowsheets/MAR for additional transportation documentation. Proper body mechanics and safe patient handling equipment were utilized throughout transport.     Thank you for consulting ICU transport team,   Liv Keddrick Wyne RN

## 2024-05-11 LAB — BLOOD GAS, VENOUS
BASE EXCESS VENOUS: 2.2 — ABNORMAL HIGH (ref -2.0–2.0)
CARBOXYHEMOGLOBIN, VENOUS: 2.1 % — ABNORMAL HIGH (ref <1.2)
HCO3 VENOUS: 26 mmol/L (ref 22–27)
METHEMOGLOBIN, VENOUS: <1 % (ref <1.5)
O2 SATURATION VENOUS: 89.7 % — ABNORMAL HIGH (ref 40.0–85.0)
OXYHEMOGLOBIN, VENOUS: 87.5 % — ABNORMAL HIGH (ref 40.0–85.0)
PCO2 VENOUS: 39 mmHg — ABNORMAL LOW (ref 40–60)
PH VENOUS: 7.44 — ABNORMAL HIGH (ref 7.32–7.43)
PO2 VENOUS: 62 mmHg — ABNORMAL HIGH (ref 30–55)

## 2024-05-11 LAB — CBC W/ AUTO DIFF
BASOPHILS ABSOLUTE COUNT: 0.1 10*9/L (ref 0.0–0.1)
BASOPHILS RELATIVE PERCENT: 1.2 %
EOSINOPHILS ABSOLUTE COUNT: 0.5 10*9/L (ref 0.0–0.5)
EOSINOPHILS RELATIVE PERCENT: 7.6 %
HEMATOCRIT: 24.7 % — ABNORMAL LOW (ref 34.0–44.0)
HEMOGLOBIN: 8 g/dL — ABNORMAL LOW (ref 11.3–14.9)
LYMPHOCYTES ABSOLUTE COUNT: 1.6 10*9/L (ref 1.1–3.6)
LYMPHOCYTES RELATIVE PERCENT: 22.2 %
MEAN CORPUSCULAR HEMOGLOBIN CONC: 32.2 g/dL (ref 32.0–36.0)
MEAN CORPUSCULAR HEMOGLOBIN: 29.7 pg (ref 25.9–32.4)
MEAN CORPUSCULAR VOLUME: 92.2 fL (ref 77.6–95.7)
MEAN PLATELET VOLUME: 9.1 fL (ref 6.8–10.7)
MONOCYTES ABSOLUTE COUNT: 0.9 10*9/L — ABNORMAL HIGH (ref 0.3–0.8)
MONOCYTES RELATIVE PERCENT: 12.3 %
NEUTROPHILS ABSOLUTE COUNT: 4 10*9/L (ref 1.8–7.8)
NEUTROPHILS RELATIVE PERCENT: 56.7 %
PLATELET COUNT: 220 10*9/L (ref 150–450)
RED BLOOD CELL COUNT: 2.68 10*12/L — ABNORMAL LOW (ref 3.95–5.13)
RED CELL DISTRIBUTION WIDTH: 18.1 % — ABNORMAL HIGH (ref 12.2–15.2)
WBC ADJUSTED: 7 10*9/L (ref 3.6–11.2)

## 2024-05-11 LAB — HEPATIC FUNCTION PANEL
ALBUMIN: 2 g/dL — ABNORMAL LOW (ref 3.4–5.0)
ALKALINE PHOSPHATASE: 184 U/L — ABNORMAL HIGH (ref 46–116)
ALT (SGPT): 8 U/L — ABNORMAL LOW (ref 10–49)
AST (SGOT): 42 U/L — ABNORMAL HIGH (ref <=34)
BILIRUBIN DIRECT: 0.3 mg/dL (ref 0.00–0.30)
BILIRUBIN TOTAL: 0.6 mg/dL (ref 0.3–1.2)
PROTEIN TOTAL: 5.8 g/dL (ref 5.7–8.2)

## 2024-05-11 LAB — SLIDE REVIEW

## 2024-05-11 LAB — MAGNESIUM: MAGNESIUM: 2 mg/dL (ref 1.6–2.6)

## 2024-05-11 LAB — PHOSPHORUS: PHOSPHORUS: 2.8 mg/dL (ref 2.4–5.1)

## 2024-05-11 MED ADMIN — sodium chloride (NS) 0.9 % flush 10 mL: 10 mL | INTRAVENOUS | @ 09:00:00

## 2024-05-11 MED ADMIN — sodium chloride (NS) 0.9 % flush 10 mL: 10 mL | INTRAVENOUS | @ 16:00:00

## 2024-05-11 MED ADMIN — sodium chloride (NS) 0.9 % flush 10 mL: 10 mL | INTRAVENOUS

## 2024-05-11 MED ADMIN — rifAXIMin (XIFAXAN) oral suspension: 550 mg | GASTROENTERAL | @ 02:00:00 | Stop: 2025-04-11

## 2024-05-11 MED ADMIN — rifAXIMin (XIFAXAN) oral suspension: 550 mg | GASTROENTERAL | @ 14:00:00 | Stop: 2025-04-11

## 2024-05-11 MED ADMIN — lactulose oral solution: 30 g | GASTROENTERAL | @ 14:00:00

## 2024-05-11 MED ADMIN — lactulose oral solution: 30 g | GASTROENTERAL | @ 02:00:00

## 2024-05-11 MED ADMIN — lactulose oral solution: 30 g | GASTROENTERAL | @ 20:00:00

## 2024-05-11 MED ADMIN — folic acid (FOLVITE) tablet 1 mg: 1 mg | GASTROENTERAL | @ 14:00:00

## 2024-05-11 MED ADMIN — thiamine mononitrate (vit B1) tablet 100 mg: 100 mg | GASTROENTERAL | @ 14:00:00

## 2024-05-11 MED ADMIN — ergocalciferol (DRISDOL) oral drops 200 mcg/mL (8,000 unit/mL): 100 ug | GASTROENTERAL | @ 14:00:00 | Stop: 2024-05-21

## 2024-05-11 MED ADMIN — cyanocobalamin (vitamin B-12) tablet 1,000 mcg: 1000 ug | GASTROENTERAL | @ 14:00:00

## 2024-05-11 MED ADMIN — levETIRAcetam (KEPPRA) tablet 750 mg: 750 mg | GASTROENTERAL | @ 14:00:00

## 2024-05-11 MED ADMIN — levETIRAcetam (KEPPRA) tablet 750 mg: 750 mg | GASTROENTERAL | @ 02:00:00

## 2024-05-11 MED ADMIN — famotidine (PEPCID) tablet 20 mg: 20 mg | GASTROENTERAL | @ 14:00:00

## 2024-05-11 MED ADMIN — melatonin tablet 3 mg: 3 mg | GASTROENTERAL | @ 22:00:00

## 2024-05-11 MED ADMIN — apixaban (ELIQUIS) tablet 5 mg: 5 mg | GASTROENTERAL | @ 14:00:00

## 2024-05-11 MED ADMIN — apixaban (ELIQUIS) tablet 5 mg: 5 mg | GASTROENTERAL | @ 02:00:00

## 2024-05-11 MED ADMIN — multivitamins, therapeutic with minerals tablet 1 tablet: 1 | GASTROENTERAL | @ 14:00:00

## 2024-05-11 MED ADMIN — metoPROLOL tartrate (Lopressor) tablet 25 mg: 25 mg | GASTROENTERAL | @ 02:00:00

## 2024-05-11 MED ADMIN — metoPROLOL tartrate (Lopressor) tablet 25 mg: 25 mg | GASTROENTERAL | @ 16:00:00

## 2024-05-11 MED ADMIN — lacosamide (VIMPAT) tablet 100 mg: 100 mg | GASTROENTERAL | @ 16:00:00

## 2024-05-11 MED ADMIN — lacosamide (VIMPAT) tablet 100 mg: 100 mg | GASTROENTERAL | @ 02:00:00

## 2024-05-11 MED ADMIN — piperacillin-tazobactam (ZOSYN) IVPB (premix) 4.5 g: 4.5 g | INTRAVENOUS | @ 20:00:00 | Stop: 2024-05-15

## 2024-05-11 MED ADMIN — doxycycline (VIBRA-TABS) tablet 100 mg: 100 mg | GASTROENTERAL | @ 22:00:00 | Stop: 2024-05-13

## 2024-05-11 MED ADMIN — doxycycline (VIBRA-TABS) tablet 100 mg: 100 mg | GASTROENTERAL | @ 11:00:00 | Stop: 2024-05-13

## 2024-05-11 MED ADMIN — piperacillin-tazobactam (ZOSYN) IVPB (premix) 4.5 g: 4.5 g | INTRAVENOUS | @ 10:00:00 | Stop: 2024-05-11

## 2024-05-11 MED ADMIN — piperacillin-tazobactam (ZOSYN) IVPB (premix) 4.5 g: 4.5 g | INTRAVENOUS | @ 02:00:00 | Stop: 2024-05-11

## 2024-05-11 MED ADMIN — acetaminophen (TYLENOL) tablet 650 mg: 650 mg | ORAL | @ 06:00:00 | Stop: 2024-05-11

## 2024-05-11 NOTE — Progress Notes (Signed)
 MICU Daily Progress Note     Date of Service: 05/11/2024    Problem List:   Principal Problem:    Bacteremia, escherichia coli  Active Problems:    Alcoholic liver disease (HHS-HCC)    HTN (hypertension)    Depression with anxiety    Class 2 severe obesity with serious comorbidity in adult    Atrial fibrillation    (CMS-HCC)    Pleural effusion    Hypernatremia    Thrombocytopenia    Cirrhosis    (CMS-HCC)    A-fib (CMS-HCC)    Hepatic encephalopathy    (CMS-HCC)    Anaphylaxis      Interval history: [name] is a 77 y.o. female with HTN, MetALD cirrhosis, severe obseity, and persistent a fib who presented to [facility] initially for encephalopathy following an aborted DCCV (LAA thrombus found). She subsequently had a complex hospital stay with multiple ICU admissions, PEA arrest, multiple intubations and eventually tracheostomy. Currently weaning trach, and providing NGT feeds, but mental status continues to be poor. She was admitted to the MICU for hypotension requiring norepinephrine .     Rapid was called for hypotension on 2024-05-12 where patient had BP 70s/20s.  On exam, she was at her baseline (opening her eyes to her name, grimacing to voice, moving all extremities to painful stimuli).  During these events she was given IVF (to which she did not respond) and ultimately started on norepinephrine .  She underwent additional sepsis workup 12/7 which showed a trach culture with Pseudomonas that is likely similar sensitivities to previous isolate.  There is concern for sepsis 2/2 RLE cellulitis and pneumonia. Antibiotics were transitioned from vancomycin  -> linezolid  -> doxycycline .       **Per daughter, she does yet not want to have her mother told that her husband passed away in hospice on 2024-05-12**    24-hr update: As above    Neurological   Encephalopathy, multifactorial - Focal non-convulsive seizures  Multiple drivers, prolonged ICU stays, cardiac arrest, metabolic derangements have been corrected, likely some hepatic encephalopathy. Did have subclinical seizures seen on EEG earlier in her course as well. Cefepime  also possible contributing. Avoiding other centrally acting medications, though seems to be tolerating low dose infrequent oxycodone . Intermittently opening eyes to verbal and tactile stimuli - 05/12/24 she did so, but is otherwise not interacting.  Of note, has become more encephalopathic in the past if she does not have adequate bowel movements.  - Continue Lactulose , only hold if >1040ml stool via FMS  - Continue Rifaximin  550 mg twice daily  - Ongoing re-evalaution especially with family at bedside as able  - Discussed with neuro, while MRI Brain 12/3 improved, and could have been 2/2 prolonged subclinical seizures, or PRES, however given lack of improvement in exam findings, toxic metabolic insults also likely contributed, and it is difficult to predict how much and within what time frame she will improve  - Continue Levetiracetam  750mg  BID and Lacosamide  100mg  BID  - Continue delirium precautions      Analgesia: Pain adequately controlled  RASS at goal? N/A, not on sedation  [place] Agitation Assessment Scale (RASS) : 0 (05/11/2024 12:00 PM)       Pulmonary   Chronic hypoxemic and hypercarbic respiratory failure - Trach/vent depedent - Apneic events  Initially suspected to be due to encephalopathy as above.  Multiple chest x-rays have shown pleural effusions.  Intubated 10/24 through 11/14. Failed extubation attempts and tracheostomy placed on 11/14.  While with hospitalist team, has  had difficulty weaning pressure support and has required very slow decreases as she has become hypercarbic with faster weans.  Given her apneic episodes and poor mechanics, mental status/encephalopathy appears to be driving vent dependence.  - Continue trials of CPAP  - Routine trach care per RT  - VBG qAM    S RR:  [14-15] 15  FiO2 (%):  [25 %] 25 %  S VT:  [350 mL] 350 mL  PR SUP:  [10 cm H20-15 cm H20] 12 cm H20  O2 Device: Ventilator    Cardiovascular   Hypotension  Recurrent issue.  Septic shock earlier in hospitalization (on transfer to MICU from [facility]). Seems to be sensitive to diuresis. Repeat episode of hypotension on 12/8 requiring pressors unlikely to be due to septic shock given off pressors within 24 hours.  - Off norepinephrine  gtt since 12/9 AM  - Consider albumin  or IV fluids for hypotension    Persistent AFib - LAA thrombus  Rate controlled with medication.  - Metop 25mg  BID  - Apixaban  5mg  BID     History of prolonged Qtc  - Regular EKGs  - Most recent EKG 12/5 with QTc Frederica 405    Right internal jugular / carotid fistula, resolved  Apparently thrombosed; no flow on carotid doppler 03/26/2024. Was related to central line injury   - When approaching discharge, discuss with vascular surgery whether any follow-up needed before discharge  -- strict intake / output    PEA arrest s/p ROSC  Patient with PEA arrest peri-intubation with ROSC prior to transfer from [place].      Chronic diastolic heart failure (HFimpEF)  Not an active issue; edema unlikely cardiogenic in nature    Renal   Anasarca  Likely 2/2 prolonged critical illness, underlying cirrhosis, and hypoalbuminemia. RLE is worse than other limbs (though R side overall is more edematous).  Was receiving Lasix  while with hospitalist team.  Last Lasix  was 20 mg 12/7.  - Holding further diuresis since 12/8 ISO infection/fevering  - Of note, pulsatile vessel seen on neck on 12/7 that was resolved on 12/8.  Could have been secondary to volume overload. PVL BL carotid 12/8 without significant stenosis.    Infectious Disease/Autoimmune   Fever - c/f Sepsis - RLE cellulitis  Began fevering mid-day 12/8. Source of infection leg wound (RLE cellulitis) vs trach culture. No volume resuscitation prior to transfer to MICU given stable VS in the morning and volume overloaded status. Received during rapid 12/8.  Previous lower respiratory culture 11/27 with MRSA and Pseudomonas (pan-susceptible).  Continuous to have increased secretions.  - Infectious workup:              - [facility] 12/7 (Tracheal aspirate) with 3+ Pseudomonas, 1+ staph aureus   - RLE culture 12/8: <1+ pseudomonas   - Follow-up 12/8 blood cultures  - CT RLE 12/9 with RLE cellulitis without underlying abscess or deep tracking soft tissue gas.   - Abx: Continue doxycycline  x3 days (12/9- ), Zosyn  x7 days (12/7 - )    Ventilator associated pneumonia  Likely secondary to Pseudomonas as seen on lower respiratory culture and MSSA.  - Treated with 7-day course antibiotics through 12/3.  - CXR 12/8 with pleural effusions and possible consolidation that could represent pneumonia versus aspiration.   - Zosyn  x7days (12/7- )    RLE cellulitis  Worsening erythema and edema despite overall improvement in anasarca. Nearly febrile overnight 12/4, and off abx x2 days, so c/f cellulitis flaring in  that setting.  - BLE PVL 12/4 without signs of DVT  - CT RLE 12/9 with RLE cellulitis without underlying abscess or deep tracking soft tissue gas.   - Workup and antibiotics as above    History of E. coli bacteremia.  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  GLIM criteria:   Pt does not meet criteria  -I have screened this patient for malnutrition and they did NOT meet criteria for malnutrition based on GLIM criteria.  -Nutrition consulted yes, see RD assessment below  RD assessment:  Patient does not meet AND/ASPEN criteria for malnutrition at this time (03/16/24 1326)      - Consider reassessment by nutrition  - Tube feeds at goal.  FWF 100 mL q2h   - Depending on GOC, may require PEG.      Heme/Coag   Anemia, chronic (stable)  - Initially secondary to blood loss (early in hospitalization)  - Stable  - CTM with daily CBC      Endocrine   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)   -  holding home anastrozole     Integumentary     #c/f RLE cellulitis  - See above    #Skin ulcer on elbow  - Wound care consulted  - Pending assessment    Prophylaxis/LDA/Restraints/Consults   ICU Checklist completed:  see ICU rounding navigator      Patient Lines/Drains/Airways Status       Active Active Lines, Drains, & Airways       Name Placement date Placement time Site Days    Tracheostomy Shiley 6 Cuffed 05/09/24  1327  6  2    NG/OG Tube Feedings 10 Fr. Right nostril 04/01/24  1138  Right nostril  40    External Urinary Device 04/13/24 With Suction 04/13/24  1700  -- 27    PICC Double Lumen 04/06/24 Left Basilic 04/06/24  1558  Basilic  34    Peripheral IV 88/79/74 Right Wrist 04/21/24  0830  Wrist  20    Peripheral IV 04/30/24 Distal;Posterior;Right Forearm 04/30/24  1832  Forearm  10  Patient Lines/Drains/Airways Status       Active Wounds       Name Placement date Placement time Site Days    Wound 03/13/24 Irritant Contact Dermatitis Incontinence Sacrum Mid gluteal cleft MASD? 03/13/24  --  Sacrum  59    Wound 04/27/24 Traumatic Abrasion Pretibial Right 04/27/24  1500  Pretibial  13    Wound 05/09/24 Elbow Posterior;Right 05/09/24  0750  Elbow  2                    Goals of Care     GOC   Multiple prior ACP discussions. Appreciate palliative care input. Goal is for functional recovery, unfortunately mental status appears to be very waxing/waning and per dw neuro, prognosis is uncertain and will take a long time to recover if she ever does.   - S/p family meeting 12/4 over the phone, provided updates, daughter still wants to give her some time and would like to see her in person, unfortunately currently has a stomach bug  - Palliative care following, appreciate assistance in continuing to have ongoing GOC conversations.    Code Status:   Orders Placed This Encounter   Procedures    Full Code     Standing Status:   Standing     Number of Occurrences:   1        Designated Healthcare Decision Maker:  Ms. Garrott designated healthcare decision maker(s) is/are   HCDM (patient stated preference): Ridge,[name] - Daughter - [phone]    HCDM (patient stated preference): Hakanson,[name] - Spouse - [phone]. See HCDM section of Epic sidebar/storyboard or ACP tab in patient chart for details regarding active HCDMs and patient capacity for decision-making.      Subjective     Ventilated with trach.  Does not open eyes to verbal stimuli or command. Does not verbally respond or follow commands. Moving around this morning in bed.       Objective     Vitals - past 24 hours  Temp:  [36.8 ??C (98.2 ??F)-38.3 ??C (100.9 ??F)] 37.2 ??C (98.9 ??F)  Pulse:  [88-110] 98  SpO2 Pulse:  [86-112] 94  Resp:  [14-29] 17  BP: (104-168)/(45-93) 168/82  FiO2 (%):  [25 %] 25 %  SpO2:  [91 %-100 %] 98 % Intake/Output  I/O last 3 completed shifts:  In: 2296.3 [I.V.:99.6; NG/GT:1660; IV Piggyback:536.7]  Out: 2900 [Urine:1000; Stool:1900]     Physical Exam:    Gen: lying in bed, does not open eyes or react to verbal stimuli  HEENT: Dry MM, NGT in place with feeds running, trach in place, resists eye opening (by moving head).   CV: Regular rate, irregular rhythm, no MRG  Pulm: CTAB  anteriorly  Abd: soft, non-tender, normal BS  Ext: diffuse edema right > left of lower extremities. RLE erythematous and warm. LUE PICC in place  Neuro: opens eyes to name, but not on command. not following instructions. Withdraws to stimuli.   Continuous Infusions:   Infusions Meds[1]    Scheduled Medications:   Scheduled Medications[2]    PRN medications:  PRN Medications[3]    Data/Imaging Review: Reviewed in Epic and personally interpreted on 05/11/2024. See EMR for detailed results.    Alylah Blakney, MD  Internal Medicine PGY1         [1] [2]    apixaban   5 mg Enteral tube: gastric BID    cyanocobalamin  (vitamin B-12)  1,000 mcg Enteral tube: gastric Daily  doxycycline   100 mg Enteral tube: gastric BID    ergocalciferol   100 mcg Enteral tube: gastric Daily    famotidine   20 mg Enteral tube: gastric Daily    flu vac 2025 [id number](PF)  0.5 mL Intramuscular During hospitalization    folic acid   1 mg Enteral tube: gastric Daily    lacosamide   100 mg Enteral tube: gastric BID    lactulose   30 g Enteral tube: gastric TID    levETIRAcetam   750 mg Enteral tube: gastric BID    melatonin  3 mg Enteral tube: gastric QPM    metoPROLOL  tartrate  25 mg Enteral tube: gastric BID    multivitamins (ADULT)  1 tablet Enteral tube: gastric Daily    piperacillin -tazobactam (ZOSYN ) IV (intermittent)  4.5 g Intravenous Q8H [facility]    rifAXIMin   550 mg Enteral tube: gastric BID    sodium chloride   10 mL Intravenous Q8H    sodium chloride   10 mL Intravenous Q8H    thiamine  mononitrate (vit B1)  100 mg Enteral tube: gastric Daily   [3] acetaminophen 

## 2024-05-11 NOTE — Plan of Care (Signed)
 Pt is RASS -2 to 0, drowsy, MAP >65, O2 Sats >90% on the vent. Pt Afib.  Afebrile. External Foley in place with diminished UOP. FMS in place; over 1L stool recording during shift. Pt's receiving tube feeds at goal. Q2H turns.  On home eloquis for DVT prophylaxis. Family updated via phone. All monitors with appropriate alarm settings. Please see MAR and flowsheet for more detail.      Problem: Skin Injury Risk Increased  Goal: Skin Health and Integrity  Intervention: Optimize Skin Protection  Recent Flowsheet Documentation  Taken 05/11/2024 1800 by Rosana Damien RAMAN, RN  Activity Management: in bed  Pressure Reduction Techniques: heels elevated off bed  Head of Bed [facility]) Positioning: HOB at 30-45 degrees  Taken 05/11/2024 1600 by Rosana Damien RAMAN, RN  Activity Management: in bed  Pressure Reduction Techniques: heels elevated off bed  Head of Bed [facility]) Positioning: HOB at 30-45 degrees  Taken 05/11/2024 1400 by Rosana Damien RAMAN, RN  Activity Management: in bed  Pressure Reduction Techniques: heels elevated off bed  Head of Bed [facility]) Positioning: HOB at 30-45 degrees  Taken 05/11/2024 1200 by Rosana Damien RAMAN, RN  Activity Management: in bed  Pressure Reduction Techniques: heels elevated off bed  Head of Bed [facility]) Positioning: HOB at 30-45 degrees  Taken 05/11/2024 1000 by Rosana Damien RAMAN, RN  Activity Management: in bed  Pressure Reduction Techniques: heels elevated off bed  Head of Bed [facility]) Positioning: HOB at 30-45 degrees  Taken 05/11/2024 0800 by Rosana Damien RAMAN, RN  Activity Management: in bed  Pressure Reduction Techniques:   heels elevated off bed   positioned off wounds   pressure points protected   frequent weight shift encouraged  Head of Bed (HOB) Positioning: HOB at 30-45 degrees  Pressure Reduction Devices:   elbow protectors utilized   foam padding utilized   heel offloading device utilized  Skin Protection:   incontinence pads utilized   protective footwear used   silicone foam dressing in place Problem: Adult Inpatient Plan of Care  Goal: Absence of Hospital-Acquired Illness or Injury  Intervention: Identify and Manage Fall Risk  Recent Flowsheet Documentation  Taken 05/11/2024 0800 by Rosana Damien RAMAN, RN  Safety Interventions:   low bed   aspiration precautions   bed alarm  Intervention: Prevent Skin Injury  Recent Flowsheet Documentation  Taken 05/11/2024 1800 by Rosana Damien RAMAN, RN  Positioning for Skin: Right  Taken 05/11/2024 1600 by Rosana Damien RAMAN, RN  Positioning for Skin: Left  Taken 05/11/2024 1400 by Rosana Damien RAMAN, RN  Positioning for Skin: Right  Taken 05/11/2024 1200 by Rosana Damien RAMAN, RN  Positioning for Skin: Left  Taken 05/11/2024 1000 by Rosana Damien RAMAN, RN  Positioning for Skin: Right  Taken 05/11/2024 0800 by Rosana Damien RAMAN, RN  Positioning for Skin: Left  Device Skin Pressure Protection:   absorbent pad utilized/changed   skin-to-skin areas padded   skin-to-device areas padded   pressure points protected   positioning supports utilized   tubing/devices free from skin contact  Skin Protection:   incontinence pads utilized   protective footwear used   silicone foam dressing in place  Intervention: Prevent and Manage VTE (Venous Thromboembolism) Risk  Recent Flowsheet Documentation  Taken 05/11/2024 1800 by Rosana Damien RAMAN, RN  Anti-Embolism Device Type: SCD, Knee  Anti-Embolism Device Status: Off  Anti-Embolism Device Location: BLE  Taken 05/11/2024 1600 by Rosana Damien RAMAN, RN  Anti-Embolism Device Type: SCD, Knee  Anti-Embolism Device Status: Off  Anti-Embolism Device Location: BLE  Taken 05/11/2024 1400 by Rosana Damien RAMAN, RN  Anti-Embolism Device Type: SCD, Knee  Anti-Embolism Device Status: Off  Anti-Embolism Device Location: BLE  Taken 05/11/2024 1200 by Rosana Damien RAMAN, RN  Anti-Embolism Device Type: SCD, Knee  Anti-Embolism Device Status: Off  Anti-Embolism Device Location: BLE  Taken 05/11/2024 1000 by Rosana Damien RAMAN, RN  Anti-Embolism Device Type: SCD, Knee  Anti-Embolism Device Status: (Eloquis) On  Anti-Embolism Device Location: BLE  Taken 05/11/2024 0800 by Rosana Damien RAMAN, RN  Anti-Embolism Device Type: SCD, Knee  Anti-Embolism Device Status: (Eloquis) On  Anti-Embolism Device Location: BLE  Intervention: Prevent Infection  Recent Flowsheet Documentation  Taken 05/11/2024 0800 by Rosana Damien RAMAN, RN  Infection Prevention:   hand hygiene promoted   personal protective equipment utilized   equipment surfaces disinfected     Problem: Breathing Pattern Ineffective  Goal: Effective Breathing Pattern  Intervention: Promote Improved Breathing Pattern  Recent Flowsheet Documentation  Taken 05/11/2024 1800 by Rosana Damien RAMAN, RN  Head of Bed [facility]) Positioning: HOB at 30-45 degrees  Taken 05/11/2024 1600 by Rosana Damien RAMAN, RN  Head of Bed [facility]) Positioning: HOB at 30-45 degrees  Taken 05/11/2024 1400 by Rosana Damien RAMAN, RN  Head of Bed [facility]) Positioning: HOB at 30-45 degrees  Taken 05/11/2024 1200 by Rosana Damien RAMAN, RN  Head of Bed [facility]) Positioning: HOB at 30-45 degrees  Airway/Ventilation Management: airway patency maintained  Taken 05/11/2024 1000 by Rosana Damien RAMAN, RN  Head of Bed [facility]) Positioning: HOB at 30-45 degrees  Taken 05/11/2024 0800 by Rosana Damien RAMAN, RN  Head of Bed [facility]) Positioning: HOB at 30-45 degrees     Problem: Fall Injury Risk  Goal: Absence of Fall and Fall-Related Injury  Intervention: Promote Injury-Free Environment  Recent Flowsheet Documentation  Taken 05/11/2024 0800 by Rosana Damien RAMAN, RN  Safety Interventions:   low bed   aspiration precautions   bed alarm     Problem: Gas Exchange Impaired  Goal: Optimal Gas Exchange  Intervention: Optimize Oxygenation and Ventilation  Recent Flowsheet Documentation  Taken 05/11/2024 1800 by Rosana Damien RAMAN, RN  Head of Bed [facility]) Positioning: HOB at 30-45 degrees  Taken 05/11/2024 1600 by Rosana Damien RAMAN, RN  Head of Bed [facility]) Positioning: HOB at 30-45 degrees  Taken 05/11/2024 1400 by Rosana Damien RAMAN, RN  Head of Bed [facility]) Positioning: HOB at 30-45 degrees  Taken 05/11/2024 1200 by Rosana Damien RAMAN, RN  Head of Bed [facility]) Positioning: HOB at 30-45 degrees  Airway/Ventilation Management: airway patency maintained  Taken 05/11/2024 1000 by Rosana Damien RAMAN, RN  Head of Bed [facility]) Positioning: HOB at 30-45 degrees  Taken 05/11/2024 0800 by Rosana Damien RAMAN, RN  Head of Bed [facility]) Positioning: HOB at 30-45 degrees     Problem: Non-Violent Restraints  Intervention: Utilize least restrictive measures  Recent Flowsheet Documentation  Taken 05/11/2024 1800 by Rosana Damien RAMAN, RN  Less Restrictive Alternative:   1:1 patient care   Repositioning  Taken 05/11/2024 1600 by Rosana Damien RAMAN, RN  Less Restrictive Alternative:   1:1 patient care   Repositioning  Taken 05/11/2024 1400 by Rosana Damien RAMAN, RN  Less Restrictive Alternative:   1:1 patient care   Repositioning  Taken 05/11/2024 1200 by Rosana Damien RAMAN, RN  Less Restrictive Alternative:   1:1 patient care   Repositioning  Taken 05/11/2024 1000 by Rosana Damien RAMAN, RN  Less Restrictive Alternative:   1:1 patient care   Repositioning  Taken 05/11/2024 0800 by Rosana Damien RAMAN, RN  Less Restrictive Alternative:   1:1 patient care   Repositioning  Intervention: Patient Monitoring  Recent Flowsheet Documentation  Taken 05/11/2024 1800 by Rosana Damien RAMAN, RN  Psychological Status/Visual Check: Subdued  Circulation/Skin Integrity: No signs of injury  Range of Motion: Performed  Fluids: NPO  Food/Meal: Enteral feeding/TPN  Elimination: Incontinent/patient changed  Taken 05/11/2024 1600 by Rosana Damien RAMAN, RN  Psychological Status/Visual Check: Subdued  Circulation/Skin Integrity: No signs of injury  Range of Motion: Performed  Fluids: NPO  Food/Meal: Enteral feeding/TPN  Elimination: Incontinent/patient changed  Taken 05/11/2024 1400 by Rosana Damien RAMAN, RN  Psychological Status/Visual Check: Subdued  Circulation/Skin Integrity: No signs of injury  Range of Motion: Performed  Fluids: NPO  Food/Meal: Enteral feeding/TPN  Elimination: Incontinent/patient changed  Taken 05/11/2024 1200 by Rosana Damien RAMAN, RN  Psychological Status/Visual Check: Subdued  Circulation/Skin Integrity: No signs of injury  Range of Motion: Performed  Fluids: NPO  Food/Meal: Enteral feeding/TPN  Elimination: Incontinent/patient changed  Taken 05/11/2024 1000 by Rosana Damien RAMAN, RN  Psychological Status/Visual Check: Subdued  Circulation/Skin Integrity: No signs of injury  Range of Motion: Performed  Fluids: NPO  Food/Meal: Enteral feeding/TPN  Elimination: Incontinent/patient changed  Taken 05/11/2024 0800 by Rosana Damien RAMAN, RN  Psychological Status/Visual Check: Subdued  Circulation/Skin Integrity: No signs of injury  Range of Motion: Performed  Fluids: NPO  Food/Meal: Enteral feeding/TPN  Elimination: Incontinent/patient changed  Intervention: Patient Education  Recent Flowsheet Documentation  Taken 05/11/2024 0800 by Rosana Damien RAMAN, RN  Criteria Explained: Yes  Patient's Response: No evidence of learning  Family Notification: Other     Problem: Wound  Goal: Optimal Functional Ability  Intervention: Optimize Functional Ability  Recent Flowsheet Documentation  Taken 05/11/2024 1800 by Rosana Damien RAMAN, RN  Activity Management: in bed  Taken 05/11/2024 1600 by Rosana Damien RAMAN, RN  Activity Management: in bed  Taken 05/11/2024 1400 by Rosana Damien RAMAN, RN  Activity Management: in bed  Taken 05/11/2024 1200 by Rosana Damien RAMAN, RN  Activity Management: in bed  Taken 05/11/2024 1000 by Rosana Damien RAMAN, RN  Activity Management: in bed  Taken 05/11/2024 0800 by Rosana Damien RAMAN, RN  Activity Management: in bed  Goal: Absence of Infection Signs and Symptoms  Intervention: Prevent or Manage Infection  Recent Flowsheet Documentation  Taken 05/11/2024 0800 by Rosana Damien RAMAN, RN  Infection Management: aseptic technique maintained  Goal: Optimal Pain Control and Function  Intervention: Prevent or Manage Pain  Recent Flowsheet Documentation  Taken 05/11/2024 0800 by Rosana Damien RAMAN, RN  Sleep/Rest Enhancement: relaxation techniques promoted  Goal: Skin Health and Integrity  Intervention: Optimize Skin Protection  Recent Flowsheet Documentation  Taken 05/11/2024 1800 by Rosana Damien RAMAN, RN  Activity Management: in bed  Pressure Reduction Techniques: heels elevated off bed  Head of Bed [facility]) Positioning: HOB at 30-45 degrees  Taken 05/11/2024 1600 by Rosana Damien RAMAN, RN  Activity Management: in bed  Pressure Reduction Techniques: heels elevated off bed  Head of Bed [facility]) Positioning: HOB at 30-45 degrees  Taken 05/11/2024 1400 by Rosana Damien RAMAN, RN  Activity Management: in bed  Pressure Reduction Techniques: heels elevated off bed  Head of Bed [facility]) Positioning: HOB at 30-45 degrees  Taken 05/11/2024 1200 by Rosana Damien RAMAN, RN  Activity Management: in bed  Pressure Reduction Techniques: heels elevated off bed  Head of Bed [facility]) Positioning: HOB at 30-45 degrees  Taken 05/11/2024 1000 by Rosana,  Damien RAMAN, RN  Activity Management: in bed  Pressure Reduction Techniques: heels elevated off bed  Head of Bed [facility]) Positioning: HOB at 30-45 degrees  Taken 05/11/2024 0800 by Rosana Damien RAMAN, RN  Activity Management: in bed  Pressure Reduction Techniques:   heels elevated off bed   positioned off wounds   pressure points protected   frequent weight shift encouraged  Head of Bed (HOB) Positioning: HOB at 30-45 degrees  Pressure Reduction Devices:   elbow protectors utilized   foam padding utilized   heel offloading device utilized  Skin Protection:   incontinence pads utilized   protective footwear used   silicone foam dressing in place  Goal: Optimal Wound Healing  Intervention: Promote Wound Healing  Recent Flowsheet Documentation  Taken 05/11/2024 0800 by Rosana Damien RAMAN, RN  Sleep/Rest Enhancement: relaxation techniques promoted     Problem: Mechanical Ventilation Invasive  Goal: Optimal Device Function  Intervention: Optimize Device Care and Function  Recent Flowsheet Documentation  Taken 05/11/2024 1600 by Rosana Damien RAMAN, RN  Oral Care:   tongue brushed   suction provided   oral rinse provided   mouth swabbed   lip/mouth moisturizer applied  Taken 05/11/2024 1200 by Rosana Damien RAMAN, RN  Airway/Ventilation Management: airway patency maintained  Oral Care:   suction provided   tongue brushed   oral rinse provided   mouth swabbed   lip/mouth moisturizer applied  Taken 05/11/2024 0800 by Rosana Damien RAMAN, RN  Oral Care:   tongue brushed   suction provided   oral rinse provided   mouth swabbed   lip/mouth moisturizer applied  Goal: Mechanical Ventilation Liberation  Intervention: Promote Extubation and Careers Adviser  Recent Flowsheet Documentation  Taken 05/11/2024 0800 by Rosana Damien RAMAN, RN  Sleep/Rest Enhancement: relaxation techniques promoted  Goal: Absence of Device-Related Skin and Tissue Injury  Intervention: Maintain Skin and Tissue Health  Recent Flowsheet Documentation  Taken 05/11/2024 0800 by Rosana Damien RAMAN, RN  Device Skin Pressure Protection:   absorbent pad utilized/changed   skin-to-skin areas padded   skin-to-device areas padded   pressure points protected   positioning supports utilized   tubing/devices free from skin contact  Goal: Absence of Ventilator-Induced Lung Injury  Intervention: Prevent Ventilator-Associated Pneumonia  Recent Flowsheet Documentation  Taken 05/11/2024 1800 by Rosana Damien RAMAN, RN  Head of Bed [facility]) Positioning: HOB at 30-45 degrees  Taken 05/11/2024 1600 by Rosana Damien RAMAN, RN  Head of Bed [facility]) Positioning: HOB at 30-45 degrees  Oral Care:   tongue brushed   suction provided   oral rinse provided   mouth swabbed   lip/mouth moisturizer applied  Taken 05/11/2024 1400 by Rosana Damien RAMAN, RN  Head of Bed [facility]) Positioning: HOB at 30-45 degrees  Taken 05/11/2024 1200 by Rosana Damien RAMAN, RN  Head of Bed [facility]) Positioning: HOB at 30-45 degrees  Oral Care:   suction provided   tongue brushed   oral rinse provided   mouth swabbed   lip/mouth moisturizer applied  Taken 05/11/2024 1000 by Rosana Damien RAMAN, RN  Head of Bed [facility]) Positioning: HOB at 30-45 degrees  Taken 05/11/2024 0800 by Rosana Damien RAMAN, RN  Head of Bed [facility]) Positioning: HOB at 30-45 degrees  Oral Care:   tongue brushed   suction provided   oral rinse provided   mouth swabbed   lip/mouth moisturizer applied     Problem: Mechanical Ventilation Invasive  Goal: Optimal Device Function  Intervention: Optimize Device Care and Function  Recent Flowsheet Documentation  Taken 05/11/2024 1600 by Rosana Damien RAMAN, RN  Oral Care:   tongue brushed   suction provided   oral rinse provided   mouth swabbed   lip/mouth moisturizer applied  Taken 05/11/2024 1200 by Rosana Damien RAMAN, RN  Airway/Ventilation Management: airway patency maintained  Oral Care:   suction provided   tongue brushed   oral rinse provided   mouth swabbed   lip/mouth moisturizer applied  Taken 05/11/2024 0800 by Rosana Damien RAMAN, RN  Oral Care:   tongue brushed   suction provided   oral rinse provided   mouth swabbed   lip/mouth moisturizer applied  Goal: Mechanical Ventilation Liberation  Intervention: Promote Extubation and Careers Adviser  Recent Flowsheet Documentation  Taken 05/11/2024 0800 by Rosana Damien RAMAN, RN  Sleep/Rest Enhancement: relaxation techniques promoted  Goal: Absence of Device-Related Skin and Tissue Injury  Intervention: Maintain Skin and Tissue Health  Recent Flowsheet Documentation  Taken 05/11/2024 0800 by Rosana Damien RAMAN, RN  Device Skin Pressure Protection:   absorbent pad utilized/changed   skin-to-skin areas padded   skin-to-device areas padded   pressure points protected   positioning supports utilized   tubing/devices free from skin contact  Goal: Absence of Ventilator-Induced Lung Injury  Intervention: Prevent Ventilator-Associated Pneumonia  Recent Flowsheet Documentation  Taken 05/11/2024 1800 by Rosana Damien RAMAN, RN  Head of Bed [facility]) Positioning: HOB at 30-45 degrees  Taken 05/11/2024 1600 by Rosana Damien RAMAN, RN  Head of Bed [facility]) Positioning: HOB at 30-45 degrees  Oral Care:   tongue brushed   suction provided   oral rinse provided   mouth swabbed   lip/mouth moisturizer applied  Taken 05/11/2024 1400 by Rosana Damien RAMAN, RN  Head of Bed [facility]) Positioning: HOB at 30-45 degrees  Taken 05/11/2024 1200 by Rosana Damien RAMAN, RN  Head of Bed [facility]) Positioning: HOB at 30-45 degrees  Oral Care:   suction provided   tongue brushed   oral rinse provided   mouth swabbed   lip/mouth moisturizer applied  Taken 05/11/2024 1000 by Rosana Damien RAMAN, RN  Head of Bed [facility]) Positioning: HOB at 30-45 degrees  Taken 05/11/2024 0800 by Rosana Damien RAMAN, RN  Head of Bed [facility]) Positioning: HOB at 30-45 degrees  Oral Care:   tongue brushed   suction provided   oral rinse provided   mouth swabbed   lip/mouth moisturizer applied     Problem: Mechanical Ventilation Invasive  Goal: Optimal Device Function  Intervention: Optimize Device Care and Function  Recent Flowsheet Documentation  Taken 05/11/2024 1600 by Rosana Damien RAMAN, RN  Oral Care:   tongue brushed   suction provided   oral rinse provided   mouth swabbed   lip/mouth moisturizer applied  Taken 05/11/2024 1200 by Rosana Damien RAMAN, RN  Airway/Ventilation Management: airway patency maintained  Oral Care:   suction provided   tongue brushed   oral rinse provided   mouth swabbed   lip/mouth moisturizer applied  Taken 05/11/2024 0800 by Rosana Damien RAMAN, RN  Oral Care:   tongue brushed   suction provided   oral rinse provided   mouth swabbed   lip/mouth moisturizer applied  Goal: Mechanical Ventilation Liberation  Intervention: Promote Extubation and Tour Manager Liberation  Recent Flowsheet Documentation  Taken 05/11/2024 0800 by Rosana Damien RAMAN, RN  Sleep/Rest Enhancement: relaxation techniques promoted  Goal: Absence of Device-Related Skin and Tissue Injury  Intervention: Maintain Skin and Tissue Health  Recent Flowsheet Documentation  Taken  05/11/2024 0800 by Rosana Damien RAMAN, RN  Device Skin Pressure Protection:   absorbent pad utilized/changed   skin-to-skin areas padded   skin-to-device areas padded   pressure points protected   positioning supports utilized   tubing/devices free from skin contact  Goal: Absence of Ventilator-Induced Lung Injury  Intervention: Prevent Ventilator-Associated Pneumonia  Recent Flowsheet Documentation  Taken 05/11/2024 1800 by Rosana Damien RAMAN, RN  Head of Bed [facility]) Positioning: HOB at 30-45 degrees  Taken 05/11/2024 1600 by Rosana Damien RAMAN, RN  Head of Bed [facility]) Positioning: HOB at 30-45 degrees  Oral Care:   tongue brushed   suction provided   oral rinse provided   mouth swabbed   lip/mouth moisturizer applied  Taken 05/11/2024 1400 by Rosana Damien RAMAN, RN  Head of Bed [facility]) Positioning: HOB at 30-45 degrees  Taken 05/11/2024 1200 by Rosana Damien RAMAN, RN  Head of Bed [facility]) Positioning: HOB at 30-45 degrees  Oral Care:   suction provided   tongue brushed   oral rinse provided   mouth swabbed   lip/mouth moisturizer applied  Taken 05/11/2024 1000 by Rosana Damien RAMAN, RN  Head of Bed [facility]) Positioning: HOB at 30-45 degrees  Taken 05/11/2024 0800 by Rosana Damien RAMAN, RN  Head of Bed [facility]) Positioning: HOB at 30-45 degrees  Oral Care:   tongue brushed   suction provided   oral rinse provided   mouth swabbed   lip/mouth moisturizer applied     Problem: Mechanical Ventilation Invasive  Goal: Optimal Device Function  Intervention: Optimize Device Care and Function  Recent Flowsheet Documentation  Taken 05/11/2024 1600 by Rosana Damien RAMAN, RN  Oral Care:   tongue brushed   suction provided   oral rinse provided   mouth swabbed   lip/mouth moisturizer applied  Taken 05/11/2024 1200 by Rosana Damien RAMAN, RN  Airway/Ventilation Management: airway patency maintained  Oral Care:   suction provided   tongue brushed   oral rinse provided   mouth swabbed   lip/mouth moisturizer applied  Taken 05/11/2024 0800 by Rosana Damien RAMAN, RN  Oral Care:   tongue brushed   suction provided   oral rinse provided   mouth swabbed   lip/mouth moisturizer applied  Goal: Mechanical Ventilation Liberation  Intervention: Promote Extubation and Careers Adviser  Recent Flowsheet Documentation  Taken 05/11/2024 0800 by Rosana Damien RAMAN, RN  Sleep/Rest Enhancement: relaxation techniques promoted  Goal: Absence of Device-Related Skin and Tissue Injury  Intervention: Maintain Skin and Tissue Health  Recent Flowsheet Documentation  Taken 05/11/2024 0800 by Rosana Damien RAMAN, RN  Device Skin Pressure Protection:   absorbent pad utilized/changed   skin-to-skin areas padded   skin-to-device areas padded   pressure points protected   positioning supports utilized   tubing/devices free from skin contact  Goal: Absence of Ventilator-Induced Lung Injury  Intervention: Prevent Ventilator-Associated Pneumonia  Recent Flowsheet Documentation  Taken 05/11/2024 1800 by Rosana Damien RAMAN, RN  Head of Bed [facility]) Positioning: HOB at 30-45 degrees  Taken 05/11/2024 1600 by Rosana Damien RAMAN, RN  Head of Bed [facility]) Positioning: HOB at 30-45 degrees  Oral Care:   tongue brushed   suction provided   oral rinse provided   mouth swabbed   lip/mouth moisturizer applied  Taken 05/11/2024 1400 by Rosana Damien RAMAN, RN  Head of Bed [facility]) Positioning: HOB at 30-45 degrees  Taken 05/11/2024 1200 by Rosana Damien RAMAN, RN  Head of Bed [facility]) Positioning: HOB at 30-45 degrees  Oral Care:  suction provided   tongue brushed   oral rinse provided   mouth swabbed   lip/mouth moisturizer applied  Taken 05/11/2024 1000 by Rosana Damien RAMAN, RN  Head of Bed [facility]) Positioning: HOB at 30-45 degrees  Taken 05/11/2024 0800 by Rosana Damien RAMAN, RN  Head of Bed [facility]) Positioning: HOB at 30-45 degrees  Oral Care:   tongue brushed   suction provided   oral rinse provided   mouth swabbed   lip/mouth moisturizer applied     Problem: Infection  Goal: Absence of Infection Signs and Symptoms  Intervention: Prevent or Manage Infection  Recent Flowsheet Documentation  Taken 05/11/2024 0800 by Rosana Damien RAMAN, RN  Infection Management: aseptic technique maintained     Problem: Mechanical Ventilation Invasive  Goal: Optimal Device Function  Intervention: Optimize Device Care and Function  Recent Flowsheet Documentation  Taken 05/11/2024 1600 by Rosana Damien RAMAN, RN  Oral Care:   tongue brushed   suction provided   oral rinse provided   mouth swabbed   lip/mouth moisturizer applied  Taken 05/11/2024 1200 by Rosana Damien RAMAN, RN  Airway/Ventilation Management: airway patency maintained  Oral Care:   suction provided   tongue brushed   oral rinse provided   mouth swabbed   lip/mouth moisturizer applied  Taken 05/11/2024 0800 by Rosana Damien RAMAN, RN  Oral Care:   tongue brushed   suction provided   oral rinse provided   mouth swabbed   lip/mouth moisturizer applied  Goal: Mechanical Ventilation Liberation  Intervention: Promote Extubation and Careers Adviser  Recent Flowsheet Documentation  Taken 05/11/2024 0800 by Rosana Damien RAMAN, RN  Sleep/Rest Enhancement: relaxation techniques promoted  Goal: Absence of Device-Related Skin and Tissue Injury  Intervention: Maintain Skin and Tissue Health  Recent Flowsheet Documentation  Taken 05/11/2024 0800 by Rosana Damien RAMAN, RN  Device Skin Pressure Protection:   absorbent pad utilized/changed   skin-to-skin areas padded   skin-to-device areas padded   pressure points protected   positioning supports utilized   tubing/devices free from skin contact  Goal: Absence of Ventilator-Induced Lung Injury  Intervention: Prevent Ventilator-Associated Pneumonia  Recent Flowsheet Documentation  Taken 05/11/2024 1800 by Rosana Damien RAMAN, RN  Head of Bed [facility]) Positioning: HOB at 30-45 degrees  Taken 05/11/2024 1600 by Rosana Damien RAMAN, RN  Head of Bed [facility]) Positioning: HOB at 30-45 degrees  Oral Care:   tongue brushed   suction provided   oral rinse provided   mouth swabbed   lip/mouth moisturizer applied  Taken 05/11/2024 1400 by Rosana Damien RAMAN, RN  Head of Bed [facility]) Positioning: HOB at 30-45 degrees  Taken 05/11/2024 1200 by Rosana Damien RAMAN, RN  Head of Bed [facility]) Positioning: HOB at 30-45 degrees  Oral Care:   suction provided   tongue brushed   oral rinse provided   mouth swabbed   lip/mouth moisturizer applied  Taken 05/11/2024 1000 by Rosana Damien RAMAN, RN  Head of Bed [facility]) Positioning: HOB at 30-45 degrees  Taken 05/11/2024 0800 by Rosana Damien RAMAN, RN  Head of Bed [facility]) Positioning: HOB at 30-45 degrees  Oral Care:   tongue brushed   suction provided   oral rinse provided   mouth swabbed   lip/mouth moisturizer applied     Problem: Mechanical Ventilation Invasive  Goal: Optimal Device Function  Intervention: Optimize Device Care and Function  Recent Flowsheet Documentation  Taken 05/11/2024 1600 by Rosana Damien RAMAN, RN  Oral Care:   tongue brushed   suction  provided   oral rinse provided   mouth swabbed   lip/mouth moisturizer applied  Taken 05/11/2024 1200 by Rosana Damien RAMAN, RN  Airway/Ventilation Management: airway patency maintained  Oral Care:   suction provided   tongue brushed   oral rinse provided   mouth swabbed   lip/mouth moisturizer applied  Taken 05/11/2024 0800 by Rosana Damien RAMAN, RN  Oral Care:   tongue brushed   suction provided   oral rinse provided   mouth swabbed   lip/mouth moisturizer applied  Goal: Mechanical Ventilation Liberation  Intervention: Promote Extubation and Careers Adviser  Recent Flowsheet Documentation  Taken 05/11/2024 0800 by Rosana Damien RAMAN, RN  Sleep/Rest Enhancement: relaxation techniques promoted  Goal: Absence of Device-Related Skin and Tissue Injury  Intervention: Maintain Skin and Tissue Health  Recent Flowsheet Documentation  Taken 05/11/2024 0800 by Rosana Damien RAMAN, RN  Device Skin Pressure Protection:   absorbent pad utilized/changed   skin-to-skin areas padded   skin-to-device areas padded   pressure points protected   positioning supports utilized   tubing/devices free from skin contact  Goal: Absence of Ventilator-Induced Lung Injury  Intervention: Prevent Ventilator-Associated Pneumonia  Recent Flowsheet Documentation  Taken 05/11/2024 1800 by Rosana Damien RAMAN, RN  Head of Bed [facility]) Positioning: HOB at 30-45 degrees  Taken 05/11/2024 1600 by Rosana Damien RAMAN, RN  Head of Bed [facility]) Positioning: HOB at 30-45 degrees  Oral Care:   tongue brushed   suction provided   oral rinse provided   mouth swabbed   lip/mouth moisturizer applied  Taken 05/11/2024 1400 by Rosana Damien RAMAN, RN  Head of Bed [facility]) Positioning: HOB at 30-45 degrees  Taken 05/11/2024 1200 by Rosana Damien RAMAN, RN  Head of Bed [facility]) Positioning: HOB at 30-45 degrees  Oral Care:   suction provided   tongue brushed   oral rinse provided   mouth swabbed   lip/mouth moisturizer applied  Taken 05/11/2024 1000 by Rosana Damien RAMAN, RN  Head of Bed [facility]) Positioning: HOB at 30-45 degrees  Taken 05/11/2024 0800 by Rosana Damien RAMAN, RN  Head of Bed [facility]) Positioning: HOB at 30-45 degrees  Oral Care:   tongue brushed   suction provided   oral rinse provided   mouth swabbed   lip/mouth moisturizer applied     Problem: Artificial Airway  Goal: Optimal Device Function  Intervention: Optimize Device Care and Function  Recent Flowsheet Documentation  Taken 05/11/2024 1600 by Rosana Damien RAMAN, RN  Oral Care:   tongue brushed   suction provided   oral rinse provided   mouth swabbed   lip/mouth moisturizer applied  Taken 05/11/2024 1200 by Rosana Damien RAMAN, RN  Airway/Ventilation Management: airway patency maintained  Oral Care:   suction provided   tongue brushed   oral rinse provided   mouth swabbed   lip/mouth moisturizer applied  Taken 05/11/2024 0800 by Rosana Damien RAMAN, RN  Aspiration Precautions: upright posture maintained  Oral Care: tongue brushed   suction provided   oral rinse provided   mouth swabbed   lip/mouth moisturizer applied  Goal: Absence of Device-Related Skin or Tissue Injury  Intervention: Maintain Skin and Tissue Health  Recent Flowsheet Documentation  Taken 05/11/2024 0800 by Rosana Damien RAMAN, RN  Device Skin Pressure Protection:   absorbent pad utilized/changed   skin-to-skin areas padded   skin-to-device areas padded   pressure points protected   positioning supports utilized   tubing/devices free from skin contact     Problem: Mechanical Ventilation  Invasive  Goal: Optimal Device Function  Intervention: Optimize Device Care and Function  Recent Flowsheet Documentation  Taken 05/11/2024 1600 by Rosana Damien RAMAN, RN  Oral Care:   tongue brushed   suction provided   oral rinse provided   mouth swabbed   lip/mouth moisturizer applied  Taken 05/11/2024 1200 by Rosana Damien RAMAN, RN  Airway/Ventilation Management: airway patency maintained  Oral Care:   suction provided   tongue brushed   oral rinse provided   mouth swabbed   lip/mouth moisturizer applied  Taken 05/11/2024 0800 by Rosana Damien RAMAN, RN  Oral Care:   tongue brushed   suction provided   oral rinse provided   mouth swabbed   lip/mouth moisturizer applied  Goal: Mechanical Ventilation Liberation  Intervention: Promote Extubation and Careers Adviser  Recent Flowsheet Documentation  Taken 05/11/2024 0800 by Rosana Damien RAMAN, RN  Sleep/Rest Enhancement: relaxation techniques promoted  Goal: Absence of Device-Related Skin and Tissue Injury  Intervention: Maintain Skin and Tissue Health  Recent Flowsheet Documentation  Taken 05/11/2024 0800 by Rosana Damien RAMAN, RN  Device Skin Pressure Protection:   absorbent pad utilized/changed   skin-to-skin areas padded   skin-to-device areas padded   pressure points protected   positioning supports utilized   tubing/devices free from skin contact  Goal: Absence of Ventilator-Induced Lung Injury  Intervention: Prevent Ventilator-Associated Pneumonia  Recent Flowsheet Documentation  Taken 05/11/2024 1800 by Rosana Damien RAMAN, RN  Head of Bed [facility]) Positioning: HOB at 30-45 degrees  Taken 05/11/2024 1600 by Rosana Damien RAMAN, RN  Head of Bed [facility]) Positioning: HOB at 30-45 degrees  Oral Care:   tongue brushed   suction provided   oral rinse provided   mouth swabbed   lip/mouth moisturizer applied  Taken 05/11/2024 1400 by Rosana Damien RAMAN, RN  Head of Bed [facility]) Positioning: HOB at 30-45 degrees  Taken 05/11/2024 1200 by Rosana Damien RAMAN, RN  Head of Bed [facility]) Positioning: HOB at 30-45 degrees  Oral Care:   suction provided   tongue brushed   oral rinse provided   mouth swabbed   lip/mouth moisturizer applied  Taken 05/11/2024 1000 by Rosana Damien RAMAN, RN  Head of Bed [facility]) Positioning: HOB at 30-45 degrees  Taken 05/11/2024 0800 by Rosana Damien RAMAN, RN  Head of Bed [facility]) Positioning: HOB at 30-45 degrees  Oral Care:   tongue brushed   suction provided   oral rinse provided   mouth swabbed   lip/mouth moisturizer applied     Problem: Artificial Airway  Goal: Optimal Device Function  Intervention: Optimize Device Care and Function  Recent Flowsheet Documentation  Taken 05/11/2024 1600 by Rosana Damien RAMAN, RN  Oral Care:   tongue brushed   suction provided   oral rinse provided   mouth swabbed   lip/mouth moisturizer applied  Taken 05/11/2024 1200 by Rosana Damien RAMAN, RN  Airway/Ventilation Management: airway patency maintained  Oral Care:   suction provided   tongue brushed   oral rinse provided   mouth swabbed   lip/mouth moisturizer applied  Taken 05/11/2024 0800 by Rosana Damien RAMAN, RN  Aspiration Precautions: upright posture maintained  Oral Care:   tongue brushed   suction provided   oral rinse provided   mouth swabbed   lip/mouth moisturizer applied  Goal: Absence of Device-Related Skin or Tissue Injury  Intervention: Maintain Skin and Tissue Health  Recent Flowsheet Documentation  Taken 05/11/2024 0800 by Rosana Damien RAMAN, RN  Device Skin Pressure Protection:  absorbent pad utilized/changed   skin-to-skin areas padded   skin-to-device areas padded   pressure points protected   positioning supports utilized   tubing/devices free from skin contact

## 2024-05-11 NOTE — Plan of Care (Signed)
 Patient is currently on the ventilator and settings have not changed.   Problem: Mechanical Ventilation Invasive  Goal: Effective Communication  Outcome: Ongoing - Unchanged  Goal: Optimal Device Function  Outcome: Ongoing - Unchanged  Intervention: Optimize Device Care and Function  Recent Flowsheet Documentation  Taken 05/11/2024 0428 by Londa Lynwood LABOR, RRT  Airway/Ventilation Management: airway patency maintained  Oral Care: suction provided  Taken 05/11/2024 0206 by Londa Lynwood LABOR, RRT  Oral Care: suction provided  Taken 05/10/2024 2109 by Londa Lynwood LABOR, RRT  Oral Care:   suction provided   mouth swabbed  Goal: Mechanical Ventilation Liberation  Outcome: Ongoing - Unchanged  Goal: Optimal Nutrition Delivery  Outcome: Ongoing - Unchanged  Goal: Absence of Device-Related Skin and Tissue Injury  Outcome: Ongoing - Unchanged  Goal: Absence of Ventilator-Induced Lung Injury  Outcome: Ongoing - Unchanged  Intervention: Prevent Ventilator-Associated Pneumonia  Recent Flowsheet Documentation  Taken 05/11/2024 0428 by Londa Lynwood LABOR, RRT  Head of Bed [facility]) Positioning: HOB at 30-45 degrees  VAP Prevention Bundle: HOB elevation maintained  Oral Care: suction provided  Taken 05/11/2024 0206 by Londa Lynwood LABOR, RRT  Head of Bed [facility]) Positioning: HOB at 30-45 degrees  Oral Care: suction provided  Taken 05/10/2024 2109 by Londa Lynwood LABOR, RRT  Head of Bed (HOB) Positioning: HOB at 30-45 degrees  Oral Care:   suction provided   mouth swabbed     Problem: Artificial Airway  Goal: Effective Communication  Outcome: Ongoing - Unchanged  Goal: Optimal Device Function  Outcome: Ongoing - Unchanged  Intervention: Optimize Device Care and Function  Recent Flowsheet Documentation  Taken 05/11/2024 0428 by Londa Lynwood LABOR, RRT  Airway/Ventilation Management: airway patency maintained  Oral Care: suction provided  Taken 05/11/2024 0206 by Londa Lynwood LABOR, RRT  Oral Care: suction provided  Taken 05/10/2024 2109 by Londa Lynwood LABOR, RRT  Oral Care:   suction provided   mouth swabbed  Goal: Absence of Device-Related Skin or Tissue Injury  Outcome: Ongoing - Unchanged

## 2024-05-12 LAB — BLOOD GAS, VENOUS
BASE EXCESS VENOUS: 4.4 — ABNORMAL HIGH (ref -2.0–2.0)
CARBOXYHEMOGLOBIN, VENOUS: 1.6 % — ABNORMAL HIGH (ref <1.2)
HCO3 VENOUS: 28 mmol/L — ABNORMAL HIGH (ref 22–27)
METHEMOGLOBIN, VENOUS: <1 % (ref <1.5)
O2 SATURATION VENOUS: 60.6 % (ref 40.0–85.0)
OXYHEMOGLOBIN, VENOUS: 59.1 % (ref 40.0–85.0)
PCO2 VENOUS: 42 mmHg (ref 40–60)
PH VENOUS: 7.44 — ABNORMAL HIGH (ref 7.32–7.43)
PO2 VENOUS: 38 mmHg (ref 30–55)

## 2024-05-12 MED ADMIN — sodium chloride (NS) 0.9 % flush 10 mL: 10 mL | INTRAVENOUS | @ 08:00:00 | Stop: 2024-05-12

## 2024-05-12 MED ADMIN — sodium chloride (NS) 0.9 % flush 10 mL: 10 mL | INTRAVENOUS | @ 01:00:00

## 2024-05-12 MED ADMIN — rifAXIMin (XIFAXAN) oral suspension: 550 mg | GASTROENTERAL | @ 14:00:00 | Stop: 2025-04-11

## 2024-05-12 MED ADMIN — rifAXIMin (XIFAXAN) oral suspension: 550 mg | GASTROENTERAL | @ 03:00:00 | Stop: 2025-04-11

## 2024-05-12 MED ADMIN — lactulose oral solution: 30 g | GASTROENTERAL | @ 14:00:00

## 2024-05-12 MED ADMIN — folic acid (FOLVITE) tablet 1 mg: 1 mg | GASTROENTERAL | @ 14:00:00

## 2024-05-12 MED ADMIN — thiamine mononitrate (vit B1) tablet 100 mg: 100 mg | GASTROENTERAL | @ 14:00:00

## 2024-05-12 MED ADMIN — ergocalciferol (DRISDOL) oral drops 200 mcg/mL (8,000 unit/mL): 100 ug | GASTROENTERAL | @ 14:00:00 | Stop: 2024-05-21

## 2024-05-12 MED ADMIN — cyanocobalamin (vitamin B-12) tablet 1,000 mcg: 1000 ug | GASTROENTERAL | @ 14:00:00

## 2024-05-12 MED ADMIN — levETIRAcetam (KEPPRA) tablet 750 mg: 750 mg | GASTROENTERAL | @ 14:00:00

## 2024-05-12 MED ADMIN — levETIRAcetam (KEPPRA) tablet 750 mg: 750 mg | GASTROENTERAL | @ 03:00:00

## 2024-05-12 MED ADMIN — famotidine (PEPCID) tablet 20 mg: 20 mg | GASTROENTERAL | @ 14:00:00

## 2024-05-12 MED ADMIN — melatonin tablet 3 mg: 3 mg | GASTROENTERAL | @ 23:00:00

## 2024-05-12 MED ADMIN — apixaban (ELIQUIS) tablet 5 mg: 5 mg | GASTROENTERAL | @ 14:00:00

## 2024-05-12 MED ADMIN — apixaban (ELIQUIS) tablet 5 mg: 5 mg | GASTROENTERAL | @ 03:00:00

## 2024-05-12 MED ADMIN — multivitamins, therapeutic with minerals tablet 1 tablet: 1 | GASTROENTERAL | @ 14:00:00

## 2024-05-12 MED ADMIN — metoPROLOL tartrate (Lopressor) tablet 25 mg: 25 mg | GASTROENTERAL | @ 14:00:00

## 2024-05-12 MED ADMIN — metoPROLOL tartrate (Lopressor) tablet 25 mg: 25 mg | GASTROENTERAL | @ 03:00:00

## 2024-05-12 MED ADMIN — lacosamide (VIMPAT) tablet 100 mg: 100 mg | GASTROENTERAL | @ 03:00:00

## 2024-05-12 MED ADMIN — lacosamide (VIMPAT) tablet 100 mg: 100 mg | GASTROENTERAL | @ 14:00:00

## 2024-05-12 MED ADMIN — piperacillin-tazobactam (ZOSYN) IVPB (premix) 4.5 g: 4.5 g | INTRAVENOUS | @ 11:00:00 | Stop: 2024-05-15

## 2024-05-12 MED ADMIN — piperacillin-tazobactam (ZOSYN) IVPB (premix) 4.5 g: 4.5 g | INTRAVENOUS | @ 19:00:00 | Stop: 2024-05-15

## 2024-05-12 MED ADMIN — piperacillin-tazobactam (ZOSYN) IVPB (premix) 4.5 g: 4.5 g | INTRAVENOUS | @ 03:00:00 | Stop: 2024-05-15

## 2024-05-12 MED ADMIN — doxycycline (VIBRA-TABS) tablet 100 mg: 100 mg | GASTROENTERAL | @ 23:00:00 | Stop: 2024-05-13

## 2024-05-12 MED ADMIN — doxycycline (VIBRA-TABS) tablet 100 mg: 100 mg | GASTROENTERAL | @ 11:00:00 | Stop: 2024-05-13

## 2024-05-12 NOTE — Plan of Care (Signed)
 Shift Summary  CHG wipes were administered at 10:00 AM to support hygiene and infection prevention.    Perineal care was performed at 4:00 PM, including absorbent brief change and cleansing.    Family was updated at the start of the shift, but no family was present at bedside.    Overall, comfort and skin integrity were maintained, and discharge planning progressed as scheduled.   Bath, CHG, q2h turns, q4h oral care, FMS care done. Tube feeds at goal. Please see flowsheet and MAR for vitals and trends.    Absence of Hospital-Acquired Illness or Injury: Skin and tissue health maintained with regular repositioning, device protection, and perineal care; absorbent pads and limited adhesive use supported skin integrity throughout the shift. No new hospital-acquired injuries documented.     Optimal Comfort and Wellbeing: CPOT scores remained at 0 and facial expressions were relaxed and neutral, indicating comfort was maintained; no pain interventions were required.     Readiness for Transition of Care: Expected transfer from critical care is scheduled for 05/12/24, and LTACH candidacy score remained stable at 65 throughout the shift.     Rounds/Family Conference: Family was updated at the start of the shift, but no family was present at bedside and no further updates were documented.

## 2024-05-12 NOTE — Plan of Care (Signed)
 Pt remains on vent on full support, no changes made. Airway patent and intact. Trach care and suctioning provided as needed through the night.  SBT completed and failed d/t increased RR >36 and low volumes resulting in RSBI >200. Will continue to monitor.     Problem: Breathing Pattern Ineffective  Goal: Effective Breathing Pattern  Outcome: Ongoing - Unchanged  Intervention: Promote Improved Breathing Pattern  Recent Flowsheet Documentation  Taken 05/12/2024 0243 by Constantino Rosina SAILOR, RRT  Head of Bed [facility]) Positioning: HOB at 30-45 degrees  Airway/Ventilation Management:   airway patency maintained   calming measures promoted   humidification applied  Taken 05/11/2024 2038 by Constantino Rosina SAILOR, RRT  Head of Bed [facility]) Positioning: HOB at 30-45 degrees  Airway/Ventilation Management:   airway patency maintained   calming measures promoted   humidification applied     Problem: Mechanical Ventilation Invasive  Goal: Optimal Device Function  Outcome: Ongoing - Unchanged  Intervention: Optimize Device Care and Function  Recent Flowsheet Documentation  Taken 05/12/2024 0243 by Constantino Rosina SAILOR, RRT  Airway/Ventilation Management:   airway patency maintained   calming measures promoted   humidification applied  Taken 05/11/2024 2038 by Constantino Rosina SAILOR, RRT  Airway/Ventilation Management:   airway patency maintained   calming measures promoted   humidification applied  Oral Care:   mouth swabbed   suction provided   oral rinse provided   teeth brushed   tongue brushed  Goal: Mechanical Ventilation Liberation  Outcome: Ongoing - Unchanged  Goal: Absence of Device-Related Skin and Tissue Injury  Outcome: Ongoing - Unchanged  Goal: Absence of Ventilator-Induced Lung Injury  Outcome: Ongoing - Unchanged  Intervention: Prevent Ventilator-Associated Pneumonia  Recent Flowsheet Documentation  Taken 05/12/2024 0540 by Constantino Rosina SAILOR, RRT  VAP Prevention Bundle:   readiness to extubate assessed   spontaneous breathing trial performed  Taken 05/12/2024 0243 by Constantino Rosina SAILOR, RRT  Head of Bed [facility]) Positioning: HOB at 30-45 degrees  VAP Prevention Bundle:   HOB elevation maintained   oral care regularly provided   vent circuit breaks minimized  Taken 05/11/2024 2038 by Constantino Rosina SAILOR, RRT  Head of Bed [facility]) Positioning: HOB at 30-45 degrees  VAP Prevention Bundle:   HOB elevation maintained   oral care regularly provided   vent circuit breaks minimized  Oral Care:   mouth swabbed   suction provided   oral rinse provided   teeth brushed   tongue brushed     Problem: Artificial Airway  Goal: Optimal Device Function  Outcome: Ongoing - Unchanged  Intervention: Optimize Device Care and Function  Recent Flowsheet Documentation  Taken 05/12/2024 0243 by Constantino Rosina SAILOR, RRT  Airway/Ventilation Management:   airway patency maintained   calming measures promoted   humidification applied  Taken 05/11/2024 2038 by Constantino Rosina SAILOR, RRT  Airway/Ventilation Management:   airway patency maintained   calming measures promoted   humidification applied  Oral Care:   mouth swabbed   suction provided   oral rinse provided   teeth brushed   tongue brushed  Goal: Absence of Device-Related Skin or Tissue Injury  Outcome: Ongoing - Unchanged

## 2024-05-12 NOTE — Plan of Care (Signed)
 RASS 0/-1.  CPOT 0.  BP stable.  Afebrile.  Remains on stable vent settings. Failed SBT. TF remain on and at goal. FMS remains in place with ~494mL output.

## 2024-05-12 NOTE — Consults (Signed)
 WOCN Consult Services                                                                 Wound Evaluation     Reason for Consult:   - Initial  - Skin Tear    Problem List:   Principal Problem:    Bacteremia, escherichia coli  Active Problems:    Alcoholic liver disease (HHS-HCC)    HTN (hypertension)    Depression with anxiety    Class 2 severe obesity with serious comorbidity in adult    Atrial fibrillation    (CMS-HCC)    Pleural effusion    Hypernatremia    Thrombocytopenia    Cirrhosis    (CMS-HCC)    A-fib (CMS-HCC)    Hepatic encephalopathy    (CMS-HCC)    Anaphylaxis    Assessment: Per EMR, [name] is a 78 y.o. female with HTN, MetALD cirrhosis, severe obseity, and persistent a fib who presented to [facility] initially for encephalopathy following an aborted DCCV (LAA thrombus found). She subsequently had a complex hospital stay with multiple ICU admissions, PEA arrest, multiple intubations and eventually tracheostomy. Currently weaning trach, and providing NGT feeds, but mental status continues to be poor. She was admitted to the MICU for hypotension requiring norepinephrine      We were consulted by nursing for a right upper extremity wound. Patient assessed on MICU. Right elbow intact. Right forearm with a skin tear that nursing had appropriately dressed with mepitel one and covered with foam+kerlix to absorb serous exudate. This is appropriate management of skin tears. Continue per protocol. Left upper extremity with similar skin tear may be treated the same. Assessment limited to upper extremity.     We will sign off at this time.     Wound 05/09/24 Elbow Posterior;Right (Active)   Properties   Placement Date 05/09/24   Placement Time 0750   Location Elbow   Wound Location Orientation Posterior;Right      Assessments 05/12/2024  8:50 AM   Wound Image      Dressing Status      Changed   Wound Length (cm) 1 cm   Wound Width (cm) 1 cm   Wound Depth (cm) 0.1 cm   Wound Surface Area (cm^2) 0.79 cm^2   Wound Volume (cm^3) 0.052 cm^3   Peri-wound Assessment      Clean;Dry;Intact   Odor None   Site Assessment Pink   Treatments Cleansed/Irrigation   Dressing Other (Comment);Foam (Non-bordered);Gauze wrap - 4.5 Kerlix (mepitel one)     Lab Results   Component Value Date    WBC 7.0 05/11/2024    HGB 8.0 (L) 05/11/2024    HCT 24.7 (L) 05/11/2024    ESR 13 02/29/2024    CRP 50.9 (H) 05/09/2024    A1C 4.8 02/29/2024    GLU 118 05/10/2024    POCGLU 141 04/12/2024    ALBUMIN  2.0 (L) 05/11/2024    PROT 5.8 05/11/2024     Support Surface:   - Low Air Loss - ICU    Offloading:  Left: Pillow  Right: Pillow    Type Debridement Completed By WOCN:  N/A    Teaching:  - Wound care    WOCN Recommendations:   - Contact WOCN with  questions, concerns, or wound deterioration.  - Continue to manage RUE skin tear per protocol. Okay to continue mepitel one contact layer + as needed optifoam secured with kerlix to absorb exudate.    Topical Therapy/Interventions:   - Antimicrobial Solutions  - Foam  - Silicone contact layer    Recommended Consults:  - Not Applicable    WOCN Follow Up:  - We will sign off at this time    Plan of Care Discussed With:   - RN primary    Supplies Ordered: No    Workup Time:   45 minutes    [name] Batty RN BSN  Wound, Ostomy and Continence Consult Service

## 2024-05-12 NOTE — Progress Notes (Signed)
 MICU Daily Progress Note     Date of Service: 05/12/2024    Problem List:   Principal Problem:    Bacteremia, escherichia coli  Active Problems:    Alcoholic liver disease (HHS-HCC)    HTN (hypertension)    Depression with anxiety    Class 2 severe obesity with serious comorbidity in adult    Atrial fibrillation    (CMS-HCC)    Pleural effusion    Hypernatremia    Thrombocytopenia    Cirrhosis    (CMS-HCC)    A-fib (CMS-HCC)    Hepatic encephalopathy    (CMS-HCC)    Anaphylaxis      Interval history: [name] is a 78 y.o. female with HTN, MetALD cirrhosis, severe obseity, and persistent a fib who presented to [facility] initially for encephalopathy following an aborted DCCV (LAA thrombus found). She subsequently had a complex hospital stay with multiple ICU admissions, PEA arrest, multiple intubations and eventually tracheostomy. Currently weaning trach, and providing NGT feeds, but mental status continues to be poor. She was admitted to the MICU for hypotension requiring norepinephrine .     Rapid was called for hypotension on 2024/05/28 where patient had BP 70s/20s.  On exam, she was at her baseline (opening her eyes to her name, grimacing to voice, moving all extremities to painful stimuli).  During these events she was given IVF (to which she did not respond) and ultimately started on norepinephrine .  She underwent additional sepsis workup 12/7 which showed a trach culture with Pseudomonas that is likely similar sensitivities to previous isolate.  There is concern for sepsis 2/2 RLE cellulitis and pneumonia. Antibiotics were restarted and completed doxycycline  x3 days and is on a 7-day course of zosyn .    **Per daughter, she does yet not want to have her mother told that her husband passed away in hospice on 05-28-2024**    24hr Events  - 2024/05/28 blood cultures: 1/2 Ng48h, second culture pending  05-28-2024 RLE culture: <1+ pseudomonas  - afebrile for 24 hours    Neurological   Encephalopathy, multifactorial - Focal non-convulsive seizures  Multiple drivers, prolonged ICU stays, cardiac arrest, metabolic derangements have been corrected, likely some hepatic encephalopathy. Did have subclinical seizures seen on EEG earlier in her course as well. Cefepime  also possible contributing. Avoiding other centrally acting medications, though seems to be tolerating low dose infrequent oxycodone . Intermittently opening eyes to verbal and tactile stimuli - 2024-05-28 she did so, but is otherwise not interacting.  Of note, has become more encephalopathic in the past if she does not have adequate bowel movements.  - Continue Lactulose , only hold if >1086ml stool via FMS  - Continue Rifaximin  550 mg twice daily  - Ongoing re-evalaution especially with family at bedside as able  - Discussed with neuro, while MRI Brain 12/3 improved, and could have been 2/2 prolonged subclinical seizures, or PRES, however given lack of improvement in exam findings, toxic metabolic insults also likely contributed, and it is difficult to predict how much and within what time frame she will improve  - Continue Levetiracetam  750mg  BID and Lacosamide  100mg  BID  - Continue delirium precautions      Analgesia: Pain adequately controlled  RASS at goal? N/A, not on sedation  [place] Agitation Assessment Scale (RASS) : -1 (05/12/2024  8:00 AM)       Pulmonary   Chronic hypoxemic and hypercarbic respiratory failure - Trach/vent depedent - Apneic events  Initially suspected to be due to encephalopathy as above.  Multiple chest x-rays have shown pleural effusions.  Intubated 10/24 through 11/14. Failed extubation attempts and tracheostomy placed on 11/14.  While with hospitalist team, has had difficulty weaning pressure support and has required very slow decreases as she has become hypercarbic with faster weans.  Given her apneic episodes and poor mechanics, mental status/encephalopathy appears to be driving vent dependence.  - Continue trials of PSV/CPAP during day  - Routine trach care per RT  - VBG qAM    Vent Mode: PRVC  S RR:  [0-15] 0  FiO2 (%):  [25 %] 25 %  S VT:  [0 mL-350 mL] 0 mL  PR SUP:  [5 cm H20-14 cm H20] 14 cm H20  O2 Device: Ventilator    Cardiovascular   Hypotension  Recurrent issue.  Septic shock earlier in hospitalization (on transfer to MICU from [facility]). Seems to be sensitive to diuresis. Repeat episode of hypotension on 12/8 requiring pressors unlikely to be due to septic shock given off pressors within 24 hours.  - Off norepinephrine  gtt since 12/9 AM  - Consider albumin  or IV fluids for hypotension    Persistent AFib - LAA thrombus  Continues to be in Afib. Rate controlled with medication.  - Metop 25mg  BID  - Apixaban  5mg  BID     History of prolonged Qtc  - Regular EKGs  - Most recent EKG 12/5 with QTc Frederica 405    Right internal jugular / carotid fistula, resolved  Apparently thrombosed; no flow on carotid doppler 03/26/2024. Was related to central line injury   - When approaching discharge, discuss with vascular surgery whether any follow-up needed before discharge  -- strict intake / output    PEA arrest s/p ROSC  Patient with PEA arrest peri-intubation with ROSC prior to transfer from [place].      Chronic diastolic heart failure (HFimpEF)  Not an active issue; edema unlikely cardiogenic in nature    Renal   Anasarca  Likely 2/2 prolonged critical illness, underlying cirrhosis, and hypoalbuminemia. RLE is worse than other limbs (though R side overall is more edematous).  Was receiving Lasix  prior to MICU transfer.  Last Lasix  was 20 mg 12/7.  - Holding further diuresis since 12/7 ISO infections and recurrent fevers. Recommend holding diuresis until stabilization of infection and blood pressure.  - Of note, pulsatile vessel seen on neck on 12/7 that was resolved on 12/8.  Could have been secondary to volume overload. PVL BL carotid 12/8 without significant stenosis.    Infectious Disease/Autoimmune   Fever - c/f Sepsis - RLE cellulitis  Began fevering mid-day 12/8. Source of infection leg wound (RLE cellulitis) vs trach culture. No volume resuscitation prior to transfer to MICU given stable VS in the morning and volume overloaded status. Received during rapid 12/8.  Previous lower respiratory culture 11/27 with MRSA and Pseudomonas (pan-susceptible).  Continuous to have increased secretions.  - Infectious workup:              - [facility] 12/7 (Tracheal aspirate) with 3+ Pseudomonas, 1+ staph aureus   - RLE culture 12/8: <1+ pseudomonas   - Follow-up 12/8 blood cultures: 1/2 Ng48h, 2nd culture pending  - CT RLE 12/9 with RLE cellulitis without underlying abscess or deep tracking soft tissue gas.   - Abx: Continue doxycycline  x3 days (12/9-EOT 12/11), Zosyn  x7 days (12/7 - )    Ventilator associated pneumonia  Likely secondary to Pseudomonas (as seen on lower respiratory culture) and MSSA.  - Treated with 7-day  course antibiotics through 12/3.  - CXR 12/8 with pleural effusions and possible consolidation that could represent pneumonia versus aspiration.   - Antibiotics as above    RLE cellulitis  Worsening erythema and edema despite overall improvement in anasarca. Nearly febrile overnight 12/4, and off abx x2 days, so c/f cellulitis flare.  - BLE PVL 12/4 without signs of DVT  - CT RLE 12/9 with RLE cellulitis without underlying abscess or deep tracking soft tissue gas.   - Workup and antibiotics as above    History of E. 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  GLIM criteria:   Pt does not meet criteria  -I have screened this patient for malnutrition and they did NOT meet criteria for malnutrition based on GLIM criteria.  -Nutrition consulted yes, see RD assessment below  RD assessment:  Patient does not meet AND/ASPEN criteria for malnutrition at this time (03/16/24 1326)      - Consider reassessment by nutrition  - Tube feeds at goal.  FWF 100 mL q2h   - Depending on GOC, may require PEG.      Heme/Coag   Anemia, chronic (stable)  - Initially secondary to blood loss (early in hospitalization)  - Stable  - CTM with daily CBC      Endocrine   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)   -  holding home anastrozole     Integumentary     #c/f RLE cellulitis  - See above    #Skin ulcer on elbow  - Wound care consulted  - Pending assessment    Prophylaxis/LDA/Restraints/Consults   ICU Checklist completed:  see ICU rounding navigator      Patient Lines/Drains/Airways Status       Active Active Lines, Drains, & Airways       Name Placement date Placement time Site Days    Tracheostomy Shiley 6 Cuffed 05/09/24  1327  6  2    NG/OG Tube Feedings 10 Fr. Right nostril 04/01/24  1138  Right nostril  41    External Urinary Device 04/13/24 With Suction 04/13/24  1700  -- 28    PICC  Double Lumen 04/06/24 Left Basilic 04/06/24  1558  Basilic  35 Peripheral IV 04/21/24 Right Wrist 04/21/24  0830  Wrist  21    Peripheral IV 04/30/24 Distal;Posterior;Right Forearm 04/30/24  1832  Forearm  11                  Patient Lines/Drains/Airways Status       Active Wounds       Name Placement date Placement time Site Days    Wound 03/13/24 Irritant Contact Dermatitis Incontinence Sacrum Mid gluteal cleft MASD? 03/13/24  --  Sacrum  60    Wound 04/27/24 Traumatic Abrasion Pretibial Right 04/27/24  1500  Pretibial  14    Wound 05/09/24 Elbow Posterior;Right 05/09/24  0750  Elbow  3                    Goals of Care     GOC   Multiple prior ACP discussions. Appreciate palliative care input. Goal is for functional recovery, unfortunately mental status appears to be very waxing/waning and per dw neuro, prognosis is uncertain and will take a long time to recover if she ever does.   - S/p family meeting 12/4 over the phone, provided updates, daughter still wants to give her some time and would like to see her in person, unfortunately currently has a stomach bug  - Palliative care following, appreciate assistance in continuing to have ongoing GOC conversations.    Code Status:   Orders Placed This Encounter   Procedures    Full Code     Standing Status:   Standing     Number of Occurrences:   1        Designated Healthcare Decision Maker:  Ms. Tellez designated healthcare decision maker(s) is/are   HCDM (patient stated preference): Sanda,[name] - Daughter - [phone]    HCDM (patient stated preference): Winning,[name] - Spouse - [phone]. See HCDM section of Epic sidebar/storyboard or ACP tab in patient chart for details regarding active HCDMs and patient capacity for decision-making.      Subjective     Ventilated with trach.  Does not open eyes to verbal stimuli. Intermittently following some commands to open eyes or wiggle toes (wiggled right toes but not left toes).      Objective     Vitals - past 24 hours  Temp:  [36.8 ??C (98.2 ??F)-37.3 ??C (99.1 ??F)] 37.3 ??C (99.1 ??F)  Pulse:  [82-104] 90  SpO2 Pulse:  [74-104] 90  Resp:  [14-31] 29  BP: (135-167)/(44-98) 145/98  FiO2 (%):  [25 %] 25 %  SpO2:  [94 %-99 %] 99 % Intake/Output  I/O last 3 completed shifts:  In: 3646.3 [I.V.:10; NG/GT:3230; IV Piggyback:406.3]  Out: 2530 [Urine:830; Stool:1700]     Physical Exam:    Gen: lying in bed, does not open eyes or react to verbal stimuli  HEENT: Dry MM, NGT in place with feeds running, trach in place, resists eye opening (by moving head).   CV: Regular rate, irregular rhythm, no MRG  Pulm: CTAB anteriorly  Abd: soft, non-tender, normal BS  Ext: diffuse edema right > left of lower extremities. RLE with mild erythema (improving). LUE PICC in place  Neuro: intermittently opens eyes to commands. Does not consistently follow commands (wiggles right toes but not left toes). Withdraws to stimuli.   Continuous Infusions:   Infusions Meds[1]    Scheduled Medications:   Scheduled Medications[2]    PRN medications:  PRN Medications[3]  Data/Imaging Review: Reviewed in Epic and personally interpreted on 05/12/2024. See EMR for detailed results.    Honora Sells, MD  Internal Medicine PGY1           [1] [2]    apixaban   5 mg Enteral tube: gastric BID    cyanocobalamin  (vitamin B-12)  1,000 mcg Enteral tube: gastric Daily    doxycycline   100 mg Enteral tube: gastric BID    ergocalciferol   100 mcg Enteral tube: gastric Daily    famotidine   20 mg Enteral tube: gastric Daily    flu vac 2025 [id number](PF)  0.5 mL Intramuscular During hospitalization    folic acid   1 mg Enteral tube: gastric Daily    lacosamide   100 mg Enteral tube: gastric BID    lactulose   30 g Enteral tube: gastric TID    levETIRAcetam   750 mg Enteral tube: gastric BID    melatonin  3 mg Enteral tube: gastric QPM    metoPROLOL  tartrate  25 mg Enteral tube: gastric BID    multivitamins (ADULT)  1 tablet Enteral tube: gastric Daily    piperacillin -tazobactam (ZOSYN ) IV (intermittent)  4.5 g Intravenous Q8H [facility]    rifAXIMin   550 mg Enteral tube: gastric BID    thiamine  mononitrate (vit B1)  100 mg Enteral tube: gastric Daily   [3] acetaminophen 

## 2024-05-12 NOTE — Progress Notes (Signed)
 Adult Nutrition Progress Note    Visit Type: Follow-Up  Reason for Visit: Enteral Nutrition    NUTRITION INTERVENTIONS and RECOMMENDATION     Continue Nutren 2.0 at a goal rate of 62mL/hr. This provides 1680 kcals, 71 g protein, 182 g carbohydrate, 78 g fat, 0 g fiber, 580 mL free water , and meets 112% USRDI.   Recommend 1 packets of Prosource No Carb daily. This provides 15 g protein and 60 kcals.   Total nutrition: 1740 kcal, 86 g protein   FWF: Currently 100 mL q2hr, adjust prn per MD team based on goal FMS output. Minimum 30 ml q4h.   Monitor electrolytes and replete as indicated.  Micronutrients:   Vit D (13.7) - 100 mcg daily liquid   Copper  WNL on re-check s/p repletion  Pending: repeat zinc  (received std 14 dose repletion  Continue multivitamin, thiamine  100 mg    Weekly weights - last wt 11/30    NUTRITION ASSESSMENT     Patient appropriate for enteral nutrition support given mechanical ventilation.   Would benefit to adjust tube feeds to better meet nutritional needs (re-estimated to reflect stepdown status).  Lactulose  being titrated for goal of [phone] mL output per day - FMS in place.   FWF appropriately being adjusted with goal output changes over clinical course.  Micronutrients as above.     NUTRITIONALLY RELEVANT DATA     HPI & PMH:   78 y.o. woman with HTN, MetALD cirrhosis, severe obesity (BMI > 40), persistent Afib, HFimpEF (40% 2017 --> >55% 02/2024), depression, right breast cancer (ER+) s/p partial mastectomy and adjuvant XRT (2022) now on anastrozole , who was initially admitted at [facility] on 03/08/2024 for encephalopathy following sedation for DCCV (aborted due to LAA thrombus). Complicated hospital course followed, with recurrent episodes of hypoxemic hypercarbic respiratory failure mostly due to persistent encephalopathy leading to hypoventilation, ultimately requiring tracheostomy. Now transferred out of MICU on 05/01/2024 for wean of ventilator support.     Nutrition Progress: Continues to tolerate tube feeds. FMS outputs higher given ongoing high lactulose  need. Micronutrients per above. No significant nutrition changes     Medications:  Nutritionally pertinent medications reviewed and evaluated for potential food and/or medication interactions.   Scheduled: vitamin B 12, ergocalciferol  100 mcg daily, Nexium , famotidine , folic acid , lactulose , multivitamin, rifaximin , thiamine  100 mg, 50 mg elemental zinc  daily  S/p copper  repletion 11/25    Labs:   Nutritionally pertinent labs reviewed.   Copper : 80 (WNL) - 04/26/24  Zinc : 36 (L)-  04/19/24  Vitamin D : 13.7 (L) - 04/18/24      Nutritional Needs:   Daily Estimated Nutrient Needs:  Energy: 1650-1925 kcals 30-35 kcal/kg using ideal body weight (given uncertain of dry weight), 55 kg (05/02/24 1513)]  Protein: 83-110 gm [1.5-2.0 gm/kg using ideal body weight, 55 kg (05/02/24 1513)]  Carbohydrate:   [45-60% of kcal]  Fluid:   mL [per MD team      Anthropometric Data:  Height: 162.6 cm (5' 4.02)   Admission weight: 87 kg (191 lb 12.8 oz)  Last recorded weight: (!) 111.3 kg (245 lb 6 oz) (05/01/24)  IBW: 54.53 kg  BMI: Body mass index is 42.1 kg/m??.   Usual Body Weight: Unable to obtain at this time   Weight Assessment: per below - trending down pta, though pt with cirrhosis & currently unknown dry wt. Increase over admit likely influenced by fluid - pt noted with 3+ pitting edema bilateral upper and lower extremities     Wt  Readings from Last 10 Encounters:   05/01/24 (!) 111.3 kg (245 lb 6 oz)   03/07/24 89.3 kg (196 lb 12.8 oz)   03/03/24 89.1 kg (196 lb 6.4 oz)   04/23/23 96.6 kg (213 lb)   03/05/23 100.2 kg (220 lb 14.4 oz)   11/27/22 (!) 102.5 kg (225 lb 14.4 oz)   10/20/22 (!) 108 kg (238 lb)   07/17/22 (!) 108 kg (238 lb)   06/09/22 (!) 103.2 kg (227 lb 8 oz)   05/08/22 (!) 105.8 kg (233 lb 3.2 oz)     Malnutrition Assessment:  Malnutrition Assessment using AND/ASPEN or GLIM Clinical Characteristics:  Patient does not meet AND/ASPEN criteria for malnutrition at this time (03/16/24 1326)       Nutrition Focused Physical Exam:      Nutrition Evaluation  Overall Impressions: Nutrition-Focused Physical Exam not indicated due to lack of malnutrition risk factors. (03/16/24 1326)     Care plan:  Patient does not meet malnutrition criteria     Current Nutrition:  Enteral nutrition via NG tube  Nutrition Orders            Separate Enteral Additives ProSource NoCarb (Protein Modular/Sugarfree); Quantity: 1 packet daily starting at 12/02 1200    Nutren 2.0 continuous tube feed starting at 12/01 1600          Nutritionally Pertinent Allergies, Intolerances, Sensitivities, and/or Cultural/Religious Restrictions:  none identified at this time     GOALS and EVALUATION     Patient to meet 80% or greater of nutritional needs via enteral nutrition while remains on tube feeds. - Meeting/Ongoing    Motivation, Barriers, and Compliance:  Evaluation of motivation, barriers, and compliance pending at this time due to clinical status.     Discharge Planning:   Monitor for potential discharge needs with multi-disciplinary team.          Follow-Up Parameters:   1-2 times per week (and more frequent as indicated)      Hulda La MPH, RD, CNSC, LDN   Pager: [phone]  Phone: [phone]  [email or web address]

## 2024-05-13 LAB — CBC W/ AUTO DIFF
BASOPHILS ABSOLUTE COUNT: 0 10*9/L (ref 0.0–0.1)
BASOPHILS RELATIVE PERCENT: 0.9 %
EOSINOPHILS ABSOLUTE COUNT: 0.5 10*9/L (ref 0.0–0.5)
EOSINOPHILS RELATIVE PERCENT: 8.4 %
HEMATOCRIT: 23.9 % — ABNORMAL LOW (ref 34.0–44.0)
HEMOGLOBIN: 7.8 g/dL — ABNORMAL LOW (ref 11.3–14.9)
LYMPHOCYTES ABSOLUTE COUNT: 1.1 10*9/L (ref 1.1–3.6)
LYMPHOCYTES RELATIVE PERCENT: 19.5 %
MEAN CORPUSCULAR HEMOGLOBIN CONC: 32.9 g/dL (ref 32.0–36.0)
MEAN CORPUSCULAR HEMOGLOBIN: 30.3 pg (ref 25.9–32.4)
MEAN CORPUSCULAR VOLUME: 92.1 fL (ref 77.6–95.7)
MEAN PLATELET VOLUME: 8.5 fL (ref 6.8–10.7)
MONOCYTES ABSOLUTE COUNT: 0.5 10*9/L (ref 0.3–0.8)
MONOCYTES RELATIVE PERCENT: 9 %
NEUTROPHILS ABSOLUTE COUNT: 3.4 10*9/L (ref 1.8–7.8)
NEUTROPHILS RELATIVE PERCENT: 62.2 %
PLATELET COUNT: 189 10*9/L (ref 150–450)
RED BLOOD CELL COUNT: 2.59 10*12/L — ABNORMAL LOW (ref 3.95–5.13)
RED CELL DISTRIBUTION WIDTH: 18.1 % — ABNORMAL HIGH (ref 12.2–15.2)
WBC ADJUSTED: 5.4 10*9/L (ref 3.6–11.2)

## 2024-05-13 LAB — BLOOD GAS, VENOUS
BASE EXCESS VENOUS: 4.6 — ABNORMAL HIGH (ref -2.0–2.0)
CARBOXYHEMOGLOBIN, VENOUS: 1.4 % — ABNORMAL HIGH (ref <1.2)
HCO3 VENOUS: 30 mmol/L — ABNORMAL HIGH (ref 22–27)
METHEMOGLOBIN, VENOUS: <1 % (ref <1.5)
O2 SATURATION VENOUS: 54.8 % (ref 40.0–85.0)
OXYHEMOGLOBIN, VENOUS: 53.8 % (ref 40.0–85.0)
PCO2 VENOUS: 45 mmHg (ref 40–60)
PH VENOUS: 7.42 (ref 7.32–7.43)
PO2 VENOUS: 32 mmHg (ref 30–55)

## 2024-05-13 LAB — HEPATIC FUNCTION PANEL
ALBUMIN: 1.9 g/dL — ABNORMAL LOW (ref 3.4–5.0)
ALKALINE PHOSPHATASE: 171 U/L — ABNORMAL HIGH (ref 46–116)
ALT (SGPT): <7 U/L — ABNORMAL LOW (ref 10–49)
AST (SGOT): 40 U/L — ABNORMAL HIGH (ref <=34)
BILIRUBIN DIRECT: 0.4 mg/dL — ABNORMAL HIGH (ref 0.00–0.30)
BILIRUBIN TOTAL: 0.7 mg/dL (ref 0.3–1.2)
PROTEIN TOTAL: 5.7 g/dL (ref 5.7–8.2)

## 2024-05-13 LAB — PHOSPHORUS: PHOSPHORUS: 2.9 mg/dL (ref 2.4–5.1)

## 2024-05-13 LAB — MAGNESIUM: MAGNESIUM: 1.9 mg/dL (ref 1.6–2.6)

## 2024-05-13 LAB — BASIC METABOLIC PANEL
ANION GAP: 11 mmol/L (ref 5–14)
BLOOD UREA NITROGEN: 18 mg/dL (ref 9–23)
BUN / CREAT RATIO: 45
CALCIUM: 8.3 mg/dL — ABNORMAL LOW (ref 8.7–10.4)
CHLORIDE: 109 mmol/L — ABNORMAL HIGH (ref 98–107)
CO2: 29 mmol/L (ref 20.0–31.0)
CREATININE: 0.4 mg/dL — ABNORMAL LOW (ref 0.55–1.02)
EGFR CKD-EPI (2021) FEMALE: >90 mL/min/1.73m2 (ref >=60)
GLUCOSE RANDOM: 134 mg/dL (ref 70–179)
POTASSIUM: 3 mmol/L — ABNORMAL LOW (ref 3.4–4.8)
SODIUM: 149 mmol/L — ABNORMAL HIGH (ref 135–145)

## 2024-05-13 MED ADMIN — rifAXIMin (XIFAXAN) oral suspension: 550 mg | GASTROENTERAL | @ 15:00:00 | Stop: 2025-04-11

## 2024-05-13 MED ADMIN — rifAXIMin (XIFAXAN) oral suspension: 550 mg | GASTROENTERAL | @ 02:00:00 | Stop: 2025-04-11

## 2024-05-13 MED ADMIN — lactulose oral solution: 30 g | GASTROENTERAL | @ 15:00:00

## 2024-05-13 MED ADMIN — folic acid (FOLVITE) tablet 1 mg: 1 mg | GASTROENTERAL | @ 15:00:00

## 2024-05-13 MED ADMIN — thiamine mononitrate (vit B1) tablet 100 mg: 100 mg | GASTROENTERAL | @ 15:00:00

## 2024-05-13 MED ADMIN — ergocalciferol (DRISDOL) oral drops 200 mcg/mL (8,000 unit/mL): 100 ug | GASTROENTERAL | @ 15:00:00 | Stop: 2024-05-21

## 2024-05-13 MED ADMIN — cyanocobalamin (vitamin B-12) tablet 1,000 mcg: 1000 ug | GASTROENTERAL | @ 15:00:00

## 2024-05-13 MED ADMIN — levETIRAcetam (KEPPRA) tablet 750 mg: 750 mg | GASTROENTERAL | @ 15:00:00

## 2024-05-13 MED ADMIN — levETIRAcetam (KEPPRA) tablet 750 mg: 750 mg | GASTROENTERAL | @ 02:00:00

## 2024-05-13 MED ADMIN — famotidine (PEPCID) tablet 20 mg: 20 mg | GASTROENTERAL | @ 15:00:00

## 2024-05-13 MED ADMIN — melatonin tablet 3 mg: 3 mg | GASTROENTERAL | @ 23:00:00

## 2024-05-13 MED ADMIN — apixaban (ELIQUIS) tablet 5 mg: 5 mg | GASTROENTERAL | @ 02:00:00

## 2024-05-13 MED ADMIN — apixaban (ELIQUIS) tablet 5 mg: 5 mg | GASTROENTERAL | @ 15:00:00

## 2024-05-13 MED ADMIN — multivitamins, therapeutic with minerals tablet 1 tablet: 1 | GASTROENTERAL | @ 15:00:00

## 2024-05-13 MED ADMIN — metoPROLOL tartrate (Lopressor) tablet 25 mg: 25 mg | GASTROENTERAL | @ 02:00:00

## 2024-05-13 MED ADMIN — metoPROLOL tartrate (Lopressor) tablet 25 mg: 25 mg | GASTROENTERAL | @ 15:00:00

## 2024-05-13 MED ADMIN — lacosamide (VIMPAT) tablet 100 mg: 100 mg | GASTROENTERAL | @ 02:00:00

## 2024-05-13 MED ADMIN — lacosamide (VIMPAT) tablet 100 mg: 100 mg | GASTROENTERAL | @ 15:00:00

## 2024-05-13 MED ADMIN — piperacillin-tazobactam (ZOSYN) IVPB (premix) 4.5 g: 4.5 g | INTRAVENOUS | @ 19:00:00 | Stop: 2024-05-15

## 2024-05-13 MED ADMIN — piperacillin-tazobactam (ZOSYN) IVPB (premix) 4.5 g: 4.5 g | INTRAVENOUS | @ 11:00:00 | Stop: 2024-05-15

## 2024-05-13 MED ADMIN — piperacillin-tazobactam (ZOSYN) IVPB (premix) 4.5 g: 4.5 g | INTRAVENOUS | @ 03:00:00 | Stop: 2024-05-15

## 2024-05-13 MED ADMIN — doxycycline (VIBRA-TABS) tablet 100 mg: 100 mg | GASTROENTERAL | @ 11:00:00 | Stop: 2024-05-13

## 2024-05-13 MED ADMIN — potassium chloride 20 mEq in 100 mL IVPB Premix: 20 meq | INTRAVENOUS | @ 21:00:00 | Stop: 2024-05-13

## 2024-05-13 MED ADMIN — potassium chloride 20 mEq in 100 mL IVPB Premix: 20 meq | INTRAVENOUS | @ 19:00:00 | Stop: 2024-05-13

## 2024-05-13 MED ADMIN — potassium chloride 20 mEq in 100 mL IVPB Premix: 20 meq | INTRAVENOUS | @ 20:00:00 | Stop: 2024-05-13

## 2024-05-13 NOTE — Plan of Care (Signed)
 Pt remains on PRVC 362mL/+10/14/28% and is tolerating settings well.  Trach care completed X2.  BS diminished/rhonchi.  Sx moderate thick yellow secretions.  All emergency airway supplies present at the [facility].  Will continue to monitor.

## 2024-05-13 NOTE — Plan of Care (Addendum)
 Patient drowsy, but with eye reponse to voice and did mouth along to a song playing today. Did move one of her feet to command once today. VSS. Vent at 30%. Tube feeds running at goal. FMS with plenty of output today--lactulose  held per order. UOP diminished & MD notified. Daughter called and updated this shift. Rounds, turns, bath, and CHG treatment.       Problem: Skin Injury Risk Increased  Goal: Skin Health and Integrity  Intervention: Optimize Skin Protection  Recent Flowsheet Documentation  Taken 05/13/2024 1600 by Rozanna Tinnie BRAVO, RN  Pressure Reduction Techniques:   heels elevated off bed   weight shift assistance provided  Head of Bed [facility]) Positioning: HOB at 30-45 degrees  Taken 05/13/2024 1501 by Rozanna Tinnie BRAVO, RN  Pressure Reduction Techniques:   heels elevated off bed   weight shift assistance provided  Taken 05/13/2024 1400 by Brennley Curtice, Tinnie BRAVO, RN  Pressure Reduction Techniques:   heels elevated off bed   weight shift assistance provided  Head of Bed (HOB) Positioning: HOB at 30-45 degrees  Taken 05/13/2024 1200 by Rozanna Tinnie BRAVO, RN  Pressure Reduction Techniques:   heels elevated off bed   weight shift assistance provided  Head of Bed [facility]) Positioning: HOB at 30-45 degrees  Taken 05/13/2024 1115 by Rozanna Tinnie BRAVO, RN  Pressure Reduction Techniques:   heels elevated off bed   weight shift assistance provided  Taken 05/13/2024 1000 by Lawrence Mitch, Tinnie BRAVO, RN  Pressure Reduction Techniques:   heels elevated off bed   weight shift assistance provided  Head of Bed (HOB) Positioning: HOB at 30-45 degrees  Taken 05/13/2024 0800 by Rozanna Tinnie BRAVO, RN  Pressure Reduction Techniques:   heels elevated off bed   weight shift assistance provided  Taken 05/13/2024 0727 by Rozanna Tinnie BRAVO, RN  Pressure Reduction Techniques:   heels elevated off bed   weight shift assistance provided     Problem: Adult Inpatient Plan of Care  Goal: Absence of Hospital-Acquired Illness or Injury  Intervention: Identify and Manage Fall Risk  Recent Flowsheet Documentation  Taken 05/13/2024 0800 by Rozanna Tinnie BRAVO, RN  Safety Interventions:   aspiration precautions   bleeding precautions   fall reduction program maintained   lighting adjusted for tasks/safety   low bed   infection management   no IV/BP/blood draw left arm   supervised activity  Intervention: Prevent Skin Injury  Recent Flowsheet Documentation  Taken 05/13/2024 1600 by Rozanna Tinnie BRAVO, RN  Positioning for Skin: Left  Taken 05/13/2024 1501 by Rozanna Tinnie BRAVO, RN  Positioning for Skin: Left  Taken 05/13/2024 1400 by Rozanna Tinnie BRAVO, RN  Positioning for Skin: Right  Taken 05/13/2024 1200 by Rozanna Tinnie BRAVO, RN  Positioning for Skin: Left  Taken 05/13/2024 1115 by Rozanna Tinnie BRAVO, RN  Positioning for Skin: Right  Taken 05/13/2024 1000 by Rozanna Tinnie BRAVO, RN  Positioning for Skin: Right  Taken 05/13/2024 0800 by Rozanna Tinnie BRAVO, RN  Positioning for Skin: Left  Taken 05/13/2024 0727 by Rozanna Tinnie BRAVO, RN  Positioning for Skin: Left  Intervention: Prevent Infection  Recent Flowsheet Documentation  Taken 05/13/2024 0800 by Rozanna Tinnie BRAVO, RN  Infection Prevention:   environmental surveillance performed   equipment surfaces disinfected   rest/sleep promoted   hand hygiene promoted   personal protective equipment utilized   single patient room provided     Problem: Breathing Pattern Ineffective  Goal: Effective Breathing Pattern  Intervention: Promote Improved Breathing Pattern  Recent Flowsheet Documentation  Taken 05/13/2024 1600 by Rozanna Tinnie BRAVO, RN  Head of Bed [facility]) Positioning: HOB at 30-45 degrees  Taken 05/13/2024 1400 by Rozanna Tinnie BRAVO, RN  Head of Bed [facility]) Positioning: HOB at 30-45 degrees  Taken 05/13/2024 1200 by Rozanna Tinnie BRAVO, RN  Head of Bed [facility]) Positioning: HOB at 30-45 degrees  Taken 05/13/2024 1000 by Rozanna Tinnie BRAVO, RN  Head of Bed [facility]) Positioning: HOB at 30-45 degrees     Problem: Fall Injury Risk  Goal: Absence of Fall and Fall-Related Injury  Intervention: Promote Injury-Free Environment  Recent Flowsheet Documentation  Taken 05/13/2024 0800 by Rozanna Tinnie BRAVO, RN  Safety Interventions:   aspiration precautions   bleeding precautions   fall reduction program maintained   lighting adjusted for tasks/safety   low bed   infection management   no IV/BP/blood draw left arm   supervised activity     Problem: Gas Exchange Impaired  Goal: Optimal Gas Exchange  Intervention: Optimize Oxygenation and Ventilation  Recent Flowsheet Documentation  Taken 05/13/2024 1600 by Rozanna Tinnie BRAVO, RN  Head of Bed [facility]) Positioning: HOB at 30-45 degrees  Taken 05/13/2024 1400 by Rozanna Tinnie BRAVO, RN  Head of Bed [facility]) Positioning: HOB at 30-45 degrees  Taken 05/13/2024 1200 by Rozanna Tinnie BRAVO, RN  Head of Bed [facility]) Positioning: HOB at 30-45 degrees  Taken 05/13/2024 1000 by Reinhart Saulters, Tinnie BRAVO, RN  Head of Bed [facility]) Positioning: HOB at 30-45 degrees     Problem: Non-Violent Restraints  Intervention: Patient Monitoring  Recent Flowsheet Documentation  Taken 05/13/2024 1600 by Rozanna Tinnie BRAVO, RN  Psychological Status/Visual Check:   Agitated/restless   Confused  Circulation/Skin Integrity: No signs of injury  Range of Motion: Performed  Fluids: NPO  Food/Meal: Enteral feeding/TPN  Elimination: Rectal tube  Taken 05/13/2024 1400 by Rozanna Tinnie BRAVO, RN  Psychological Status/Visual Check:   Agitated/restless   Confused  Circulation/Skin Integrity: No signs of injury  Range of Motion: Performed  Fluids: NPO  Food/Meal: Enteral feeding/TPN  Elimination: Rectal tube  Taken 05/13/2024 1200 by Rozanna Tinnie BRAVO, RN  Psychological Status/Visual Check: Confused  Circulation/Skin Integrity: No signs of injury  Range of Motion: Performed  Fluids: NPO  Food/Meal: Enteral feeding/TPN  Elimination: Rectal tube  Taken 05/13/2024 1000 by Rozanna Tinnie BRAVO, RN  Psychological Status/Visual Check:   Agitated/restless   Confused  Circulation/Skin Integrity: No signs of injury  Range of Motion: Performed  Fluids: NPO  Food/Meal: Enteral feeding/TPN  Elimination: Rectal tube  Taken 05/13/2024 0800 by Rozanna Tinnie BRAVO, RN  Psychological Status/Visual Check:   Agitated/restless   Confused  Circulation/Skin Integrity: No signs of injury  Range of Motion: Performed  Fluids: NPO  Food/Meal: Enteral feeding/TPN  Elimination: Rectal tube     Problem: Wound  Goal: Absence of Infection Signs and Symptoms  Intervention: Prevent or Manage Infection  Recent Flowsheet Documentation  Taken 05/13/2024 0800 by Rozanna Tinnie BRAVO, RN  Infection Management: aseptic technique maintained  Goal: Skin Health and Integrity  Intervention: Optimize Skin Protection  Recent Flowsheet Documentation  Taken 05/13/2024 1600 by Rozanna Tinnie BRAVO, RN  Pressure Reduction Techniques:   heels elevated off bed   weight shift assistance provided  Head of Bed [facility]) Positioning: HOB at 30-45 degrees  Taken 05/13/2024 1501 by Rozanna Tinnie BRAVO, RN  Pressure Reduction Techniques:   heels elevated off bed   weight shift assistance provided  Taken 05/13/2024 1400 by Rozanna Tinnie BRAVO, RN  Pressure Reduction  Techniques:   heels elevated off bed   weight shift assistance provided  Head of Bed (HOB) Positioning: HOB at 30-45 degrees  Taken 05/13/2024 1200 by Bryelle Spiewak, Tinnie BRAVO, RN  Pressure Reduction Techniques:   heels elevated off bed   weight shift assistance provided  Head of Bed [facility]) Positioning: HOB at 30-45 degrees  Taken 05/13/2024 1115 by Rozanna Tinnie BRAVO, RN  Pressure Reduction Techniques:   heels elevated off bed   weight shift assistance provided  Taken 05/13/2024 1000 by Jasiah Buntin, Tinnie BRAVO, RN  Pressure Reduction Techniques:   heels elevated off bed   weight shift assistance provided  Head of Bed [facility]) Positioning: HOB at 30-45 degrees  Taken 05/13/2024 0800 by Rozanna Tinnie BRAVO, RN  Pressure Reduction Techniques:   heels elevated off bed   weight shift assistance provided  Taken 05/13/2024 0727 by Rozanna Tinnie BRAVO, RN  Pressure Reduction Techniques:   heels elevated off bed   weight shift assistance provided     Problem: Mechanical Ventilation Invasive  Goal: Optimal Device Function  Intervention: Optimize Device Care and Function  Recent Flowsheet Documentation  Taken 05/13/2024 1600 by Rozanna Tinnie BRAVO, RN  Oral Care:   mouth swabbed   oral rinse provided   suction provided  Taken 05/13/2024 1200 by Nozomi Mettler E, RN  Oral Care:   mouth swabbed   oral rinse provided   suction provided  Taken 05/13/2024 0727 by Ausha Sieh E, RN  Oral Care:   mouth swabbed   oral rinse provided   suction provided   tongue brushed  Goal: Absence of Ventilator-Induced Lung Injury  Intervention: Prevent Ventilator-Associated Pneumonia  Recent Flowsheet Documentation  Taken 05/13/2024 1600 by Rozanna Tinnie BRAVO, RN  Head of Bed [facility]) Positioning: HOB at 30-45 degrees  Oral Care:   mouth swabbed   oral rinse provided   suction provided  Taken 05/13/2024 1400 by Rozanna Tinnie BRAVO, RN  Head of Bed [facility]) Positioning: HOB at 30-45 degrees  Taken 05/13/2024 1200 by Rozanna Tinnie BRAVO, RN  Head of Bed [facility]) Positioning: HOB at 30-45 degrees  Oral Care:   mouth swabbed   oral rinse provided   suction provided  Taken 05/13/2024 1000 by Rozanna Tinnie BRAVO, RN  Head of Bed [facility]) Positioning: HOB at 30-45 degrees  Taken 05/13/2024 0727 by Rozanna Tinnie BRAVO, RN  Oral Care:   mouth swabbed   oral rinse provided   suction provided   tongue brushed     Problem: Mechanical Ventilation Invasive  Goal: Optimal Device Function  Intervention: Optimize Device Care and Function  Recent Flowsheet Documentation  Taken 05/13/2024 1600 by Rozanna Tinnie BRAVO, RN  Oral Care:   mouth swabbed   oral rinse provided   suction provided  Taken 05/13/2024 1200 by Pascale Maves E, RN  Oral Care:   mouth swabbed   oral rinse provided   suction provided  Taken 05/13/2024 0727 by Zacariah Belue E, RN  Oral Care:   mouth swabbed   oral rinse provided suction provided   tongue brushed  Goal: Absence of Ventilator-Induced Lung Injury  Intervention: Prevent Ventilator-Associated Pneumonia  Recent Flowsheet Documentation  Taken 05/13/2024 1600 by Rozanna Tinnie BRAVO, RN  Head of Bed [facility]) Positioning: HOB at 30-45 degrees  Oral Care:   mouth swabbed   oral rinse provided   suction provided  Taken 05/13/2024 1400 by Rozanna Tinnie BRAVO, RN  Head of Bed [facility]) Positioning: HOB at 30-45 degrees  Taken 05/13/2024 1200 by  Nayda Riesen, Tinnie BRAVO, RN  Head of Bed [facility]) Positioning: HOB at 30-45 degrees  Oral Care:   mouth swabbed   oral rinse provided   suction provided  Taken 05/13/2024 1000 by Rozanna Tinnie BRAVO, RN  Head of Bed [facility]) Positioning: HOB at 30-45 degrees  Taken 05/13/2024 0727 by Louanna Vanliew E, RN  Oral Care:   mouth swabbed   oral rinse provided   suction provided   tongue brushed     Problem: Mechanical Ventilation Invasive  Goal: Optimal Device Function  Intervention: Optimize Device Care and Function  Recent Flowsheet Documentation  Taken 05/13/2024 1600 by Shi Blankenship E, RN  Oral Care:   mouth swabbed   oral rinse provided   suction provided  Taken 05/13/2024 1200 by Rozanna Tinnie BRAVO, RN  Oral Care:   mouth swabbed   oral rinse provided   suction provided  Taken 05/13/2024 0727 by Rozanna Tinnie BRAVO, RN  Oral Care:   mouth swabbed   oral rinse provided   suction provided   tongue brushed  Goal: Absence of Ventilator-Induced Lung Injury  Intervention: Prevent Ventilator-Associated Pneumonia  Recent Flowsheet Documentation  Taken 05/13/2024 1600 by Rozanna Tinnie BRAVO, RN  Head of Bed [facility]) Positioning: HOB at 30-45 degrees  Oral Care:   mouth swabbed   oral rinse provided   suction provided  Taken 05/13/2024 1400 by Rozanna Tinnie BRAVO, RN  Head of Bed [facility]) Positioning: HOB at 30-45 degrees  Taken 05/13/2024 1200 by Rozanna Tinnie BRAVO, RN  Head of Bed [facility]) Positioning: HOB at 30-45 degrees  Oral Care:   mouth swabbed   oral rinse provided   suction provided  Taken 05/13/2024 1000 by Rozanna Tinnie BRAVO, RN  Head of Bed [facility]) Positioning: HOB at 30-45 degrees  Taken 05/13/2024 0727 by Giannie Soliday E, RN  Oral Care:   mouth swabbed   oral rinse provided   suction provided   tongue brushed     Problem: Mechanical Ventilation Invasive  Goal: Optimal Device Function  Intervention: Optimize Device Care and Function  Recent Flowsheet Documentation  Taken 05/13/2024 1600 by Carroll Ranney E, RN  Oral Care:   mouth swabbed   oral rinse provided   suction provided  Taken 05/13/2024 1200 by Rozanna Tinnie BRAVO, RN  Oral Care:   mouth swabbed   oral rinse provided   suction provided  Taken 05/13/2024 0727 by Daemon Dowty E, RN  Oral Care:   mouth swabbed   oral rinse provided   suction provided   tongue brushed  Goal: Absence of Ventilator-Induced Lung Injury  Intervention: Prevent Ventilator-Associated Pneumonia  Recent Flowsheet Documentation  Taken 05/13/2024 1600 by Rozanna Tinnie BRAVO, RN  Head of Bed [facility]) Positioning: HOB at 30-45 degrees  Oral Care:   mouth swabbed   oral rinse provided   suction provided  Taken 05/13/2024 1400 by Rozanna Tinnie BRAVO, RN  Head of Bed [facility]) Positioning: HOB at 30-45 degrees  Taken 05/13/2024 1200 by Rozanna Tinnie BRAVO, RN  Head of Bed [facility]) Positioning: HOB at 30-45 degrees  Oral Care:   mouth swabbed   oral rinse provided   suction provided  Taken 05/13/2024 1000 by Rozanna Tinnie BRAVO, RN  Head of Bed [facility]) Positioning: HOB at 30-45 degrees  Taken 05/13/2024 0727 by Daleon Willinger E, RN  Oral Care:   mouth swabbed   oral rinse provided   suction provided   tongue brushed     Problem: Infection  Goal: Absence of  Infection Signs and Symptoms  Intervention: Prevent or Manage Infection  Recent Flowsheet Documentation  Taken 05/13/2024 0800 by Rozanna Tinnie BRAVO, RN  Infection Management: aseptic technique maintained     Problem: Mechanical Ventilation Invasive  Goal: Optimal Device Function  Intervention: Optimize Device Care and Function  Recent Flowsheet Documentation  Taken 05/13/2024 1600 by Rozanna Tinnie BRAVO, RN  Oral Care:   mouth swabbed   oral rinse provided   suction provided  Taken 05/13/2024 1200 by Desten Manor E, RN  Oral Care:   mouth swabbed   oral rinse provided   suction provided  Taken 05/13/2024 0727 by Rozanna Tinnie BRAVO, RN  Oral Care:   mouth swabbed   oral rinse provided   suction provided   tongue brushed  Goal: Absence of Ventilator-Induced Lung Injury  Intervention: Prevent Ventilator-Associated Pneumonia  Recent Flowsheet Documentation  Taken 05/13/2024 1600 by Rozanna Tinnie BRAVO, RN  Head of Bed [facility]) Positioning: HOB at 30-45 degrees  Oral Care:   mouth swabbed   oral rinse provided   suction provided  Taken 05/13/2024 1400 by Rozanna Tinnie BRAVO, RN  Head of Bed [facility]) Positioning: HOB at 30-45 degrees  Taken 05/13/2024 1200 by Rozanna Tinnie BRAVO, RN  Head of Bed [facility]) Positioning: HOB at 30-45 degrees  Oral Care:   mouth swabbed   oral rinse provided   suction provided  Taken 05/13/2024 1000 by Rozanna Tinnie BRAVO, RN  Head of Bed [facility]) Positioning: HOB at 30-45 degrees  Taken 05/13/2024 0727 by Rozanna Tinnie BRAVO, RN  Oral Care:   mouth swabbed   oral rinse provided   suction provided   tongue brushed     Problem: Mechanical Ventilation Invasive  Goal: Optimal Device Function  Intervention: Optimize Device Care and Function  Recent Flowsheet Documentation  Taken 05/13/2024 1600 by Rozanna Tinnie BRAVO, RN  Oral Care:   mouth swabbed   oral rinse provided   suction provided  Taken 05/13/2024 1200 by Rozanna Tinnie BRAVO, RN  Oral Care:   mouth swabbed   oral rinse provided   suction provided  Taken 05/13/2024 0727 by Rozanna Tinnie BRAVO, RN  Oral Care:   mouth swabbed   oral rinse provided   suction provided   tongue brushed  Goal: Absence of Ventilator-Induced Lung Injury  Intervention: Prevent Ventilator-Associated Pneumonia  Recent Flowsheet Documentation  Taken 05/13/2024 1600 by Rozanna Tinnie BRAVO, RN  Head of Bed [facility]) Positioning: HOB at 30-45 degrees  Oral Care:   mouth swabbed   oral rinse provided   suction provided  Taken 05/13/2024 1400 by Rozanna Tinnie BRAVO, RN  Head of Bed [facility]) Positioning: HOB at 30-45 degrees  Taken 05/13/2024 1200 by Rozanna Tinnie BRAVO, RN  Head of Bed [facility]) Positioning: HOB at 30-45 degrees  Oral Care:   mouth swabbed   oral rinse provided   suction provided  Taken 05/13/2024 1000 by Rozanna Tinnie BRAVO, RN  Head of Bed [facility]) Positioning: HOB at 30-45 degrees  Taken 05/13/2024 0727 by Rozanna Tinnie BRAVO, RN  Oral Care:   mouth swabbed   oral rinse provided   suction provided   tongue brushed     Problem: Artificial Airway  Goal: Optimal Device Function  Intervention: Optimize Device Care and Function  Recent Flowsheet Documentation  Taken 05/13/2024 1600 by Rozanna Tinnie BRAVO, RN  Oral Care:   mouth swabbed   oral rinse provided   suction provided  Taken 05/13/2024 1200 by Shanterria Franta E, RN  Oral  Care:   mouth swabbed   oral rinse provided   suction provided  Taken 05/13/2024 0800 by Rozanna Tinnie BRAVO, RN  Aspiration Precautions:   upright posture maintained   respiratory status monitored   oral hygiene care promoted   NPO pending swallow screening/evaluation   tube feeding placement verified  Taken 05/13/2024 0727 by Desarea Ohagan E, RN  Oral Care:   mouth swabbed   oral rinse provided   suction provided   tongue brushed     Problem: Mechanical Ventilation Invasive  Goal: Optimal Device Function  Intervention: Optimize Device Care and Function  Recent Flowsheet Documentation  Taken 05/13/2024 1600 by Teala Daffron E, RN  Oral Care:   mouth swabbed   oral rinse provided   suction provided  Taken 05/13/2024 1200 by Lessie Manigo E, RN  Oral Care:   mouth swabbed   oral rinse provided   suction provided  Taken 05/13/2024 0727 by Rozanna Tinnie BRAVO, RN  Oral Care:   mouth swabbed   oral rinse provided   suction provided   tongue brushed  Goal: Absence of Ventilator-Induced Lung Injury  Intervention: Prevent Ventilator-Associated Pneumonia  Recent Flowsheet Documentation  Taken 05/13/2024 1600 by Rozanna Tinnie BRAVO, RN  Head of Bed [facility] Positioning: HOB at 30-45 degrees  Oral Care:   mouth swabbed   oral rinse provided   suction provided  Taken 05/13/2024 1400 by Rozanna Tinnie BRAVO, RN  Head of Bed [facility]) Positioning: HOB at 30-45 degrees  Taken 05/13/2024 1200 by Rozanna Tinnie BRAVO, RN  Head of Bed [facility]) Positioning: HOB at 30-45 degrees  Oral Care:   mouth swabbed   oral rinse provided   suction provided  Taken 05/13/2024 1000 by Rozanna Tinnie BRAVO, RN  Head of Bed [facility]) Positioning: HOB at 30-45 degrees  Taken 05/13/2024 0727 by Boyd Buffalo E, RN  Oral Care:   mouth swabbed   oral rinse provided   suction provided   tongue brushed     Problem: Artificial Airway  Goal: Optimal Device Function  Intervention: Optimize Device Care and Function  Recent Flowsheet Documentation  Taken 05/13/2024 1600 by Jaysten Essner E, RN  Oral Care:   mouth swabbed   oral rinse provided   suction provided  Taken 05/13/2024 1200 by Adonis Yim E, RN  Oral Care:   mouth swabbed   oral rinse provided   suction provided  Taken 05/13/2024 0800 by Rozanna Tinnie BRAVO, RN  Aspiration Precautions:   upright posture maintained   respiratory status monitored   oral hygiene care promoted   NPO pending swallow screening/evaluation   tube feeding placement verified  Taken 05/13/2024 0727 by Shakevia Sarris E, RN  Oral Care:   mouth swabbed   oral rinse provided   suction provided   tongue brushed

## 2024-05-13 NOTE — Plan of Care (Signed)
 Pt disoriented. VSS -- SBP 130s-140s, afib rhythm 90s, on ventilator 30%. Pt tolerated all medications well this shift. All alarms and monitors in place.

## 2024-05-13 NOTE — Progress Notes (Signed)
 Internal Medicine (MEDW) Progress Note    Assessment & Plan:   [name] is a 78 y.o. female who is presenting to [facility] with Bacteremia, escherichia coli, in the setting of the following pertinent/contributing co-morbidities: HTN, MetALD cirrhosis, severe obseity, and persistent a fib.    Principal Problem:    Bacteremia, escherichia coli  Active Problems:    Alcoholic liver disease (HHS-HCC)    HTN (hypertension)    Depression with anxiety    Class 2 severe obesity with serious comorbidity in adult    Atrial fibrillation    (CMS-HCC)    Pleural effusion    Hypernatremia    Thrombocytopenia    Cirrhosis    (CMS-HCC)    A-fib (CMS-HCC)    Hepatic encephalopathy    (CMS-HCC)    Anaphylaxis    Brief hospital course:  Patient presented to [facility] initially for encephalopathy following an aborted DCCV (LAA thrombus found). She subsequently had a complex hospital stay with multiple ICU admissions, PEA arrest, multiple intubations and eventually tracheostomy. Currently weaning trach, and providing NGT feeds, but mental status continues to be poor. She was admitted to the MICU 05/18/24 for hypotension requiring norepinephrine , suspect septic shock 2/2 PsA PNA vs RLE cellulitis.    **Per daughter, she does yet not want to have her mother told that her husband passed away in hospice on 05-18-2024**    24hr Events  - 05/18/2024 blood cultures: 1/2 Ng48h, second culture pending  2024/05/18 RLE culture: <1+ pseudomonas  - afebrile for 24 hours    Active Problems    GOC   Multiple prior ACP discussions. Appreciate palliative care input. Goal is for functional recovery, unfortunately mental status appears to be very waxing/waning and per dw neuro, prognosis is uncertain and will take a long time to recover if she ever does. Family meeting held 12/4 over the phone, provided updates, daughter still wants to give her some time and would like to see her in person, unfortunately currently has a stomach bug  - Palliative care following, appreciate assistance in continuing to have ongoing GOC conversations    Encephalopathy, multifactorial - Focal non-convulsive seizures  Multiple drivers, prolonged ICU stays, cardiac arrest, metabolic derangements have been corrected, likely some hepatic encephalopathy. Did have subclinical seizures seen on EEG earlier in her course as well. Avoiding centrally acting medications. Intermittently opening eyes to verbal and tactile stimuli.  Of note, has become more encephalopathic in the past if she does not have adequate bowel movements.  - Continue Lactulose , only hold if >1080ml stool via FMS  - Continue Rifaximin  550 mg twice daily  - Ongoing re-evalaution especially with family at bedside as able  - Discussed with neuro, while MRI Brain 12/3 improved, and could have been 2/2 prolonged subclinical seizures, or PRES, however given lack of improvement in exam findings, toxic metabolic insults also likely contributed, and it is difficult to predict how much and within what time frame she will improve  - Continue Levetiracetam  750mg  BID and Lacosamide  100mg  BID  - Continue delirium precautions     Chronic hypoxemic and hypercarbic respiratory failure - Trach/vent depedent - Apneic events  Initially suspected to be due to encephalopathy as above.  Multiple chest x-rays have shown pleural effusions.  Intubated 10/24-11/14. Failed extubation attempts and tracheostomy placed on 11/14.  Difficulty weaning pressure support and has required very slow decreases as she has become hypercarbic with faster weans.  Given her apneic episodes and poor mechanics, mental status/encephalopathy appears to  be driving vent dependence. Currently on 25% FiO2.  - Continue trials of PSV/CPAP during day  - Routine trach care per RT  - VBG qAM    Anasarca  Likely 2/2 prolonged critical illness, underlying cirrhosis, and hypoalbuminemia. Marked pitting edema in bilateral lower extremities ascending to thighs. Diuresis recently held in setting of hypotension and concern for infection, last dose 12/7.  - Consider diuresis    RLE cellulitis  CT RLE 12/9 with RLE cellulitis without underlying abscess or deep tracking soft tissue gas. S/p vanc. doxycycline  12/7-12/11 (completed course). Last fever 12/9. Exam with erythema of bilateral thighs concerning for persistent cellulitis vs anasarca. BLE PVL 12/4 without signs of DVT.  - Consider extended antibiotic course vs diuresis trial    Ventilator associated pneumonia  CXR 12/8 with pleural effusions and possible consolidation that could represent pneumonia versus aspiration.  Likely secondary to Pseudomonas (as seen on lower respiratory culture) and MSSA. She continues to have increased secretions. Last fever 12/9.  - Zosyn  (12/7- )  - [facility] 12/7 (Tracheal aspirate) with 3+ Pseudomonas, 1+ staph aureus  - Trach care per RT    Enteral feeds  NGT in place.  - Nutrition following  - Continue B12, thiamine , folate. MVN    The patient's presentation is complicated by the following clinically significant conditions requiring additional evaluation and treatment: - Hypercoagulable state requiring additional attention to DVT prophylaxis and treatment or chronic anticoagulation secondary to Atrial Fibrillation   - Altered mental status secondary to - Metabolic Encephalopathy POA requiring further investigation or monitoring and - Delirium POA requiring further investigation or monitoring  - Age related debility POA requiring additional resources: DME, PT, or OT  - Hypoxia requiring further investigation, treatment, or monitoring  - Anemia requiring at least daily CBC for further monitoring    Chronic Problems    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 Stable ~8.  - Daily CBC    Persistent AFib - LAA thrombus  Continues to be in Afib. Rate controlled with medication.  - Metop 25mg  BID  - Apixaban  5mg  BID     History of prolonged Qtc  - Regular EKGs  - Most recent EKG 12/5 with QTc Frederica 405    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)   -  Holding home anastrozole     Daily Checklist:  Diet: Tube Feeds  DVT PPx: Patient Already on Full Anticoagulation with eliquis   Electrolytes: Replete Potassium to >/= 3.6 and Magnesium  to >/= 1.8  Code Status: Full Code  Dispo: Transfer to [facility]    Team Contact Information:   Primary Team: Internal Medicine (MEDW)  Primary Resident: Schuyler JAYSON Breeding, MD, MD  Resident's Pager: [phone] (Gen MedW Intern - Carolee)    Interval History:   No acute events overnight.    Patient is unable to follow commands or finger track.     Objective:   Temp:  [35.9 ??C (96.7 ??F)-37.3 ??C (99.1 ??F)] 35.9 ??C (96.7 ??F)  Pulse:  [84-104] 88  SpO2 Pulse:  [84-105] 91  Resp:  [13-34] 13  BP: (  132-162)/(50-98) 140/67  FiO2 (%):  [25 %-100 %] 30 %  SpO2:  [94 %-100 %] 99 %,   Intake/Output Summary (Last 24 hours) at 05/13/2024 0616  Last data filed at 05/13/2024 0400  Gross per 24 hour   Intake 2610 ml   Output 1775 ml   Net 835 ml     Gen: Kying in bed, alert, does not react to verbal stimuli  HEENT: Dry MM, NGT in place with feeds running, trach in place  CV: Regular rate, irregular rhythm, no MRG  Pulm: Rhonchorous breath sounds bilaterally   Abd: Soft, non-tender, normal BS. FMS in place  GU: External catheter in place  Ext: Diffuse edema of bilateral extremities ascending to thighs. LUE PICC in place  Skin: Bilateral thighs erythematous  Neuro: Intermittently follows commands. Withdraws to stimuli

## 2024-05-13 NOTE — Plan of Care (Addendum)
 Problem: Mechanical Ventilation Invasive  Goal: Mechanical Ventilation Liberation  Outcome: Ongoing - Unchanged     Problem: Artificial Airway  Goal: Optimal Device Function  Outcome: Ongoing - Unchanged  Note: Trach care completed. The pt was received overnight from MICU with a #6 Shiley Cuffed on PRVC 15/350/+5/25%. Failed SBT due to RR >35 and HR >100. All emergency supplies remain at the bedside. VSS and NAD noted at this time. Will continue to monitor.   Intervention: Optimize Device Care and Function  Flowsheets (Taken 05/13/2024 0620)  Airway/Ventilation Management:   airway patency maintained   humidification applied   pulmonary hygiene promoted  Aspiration Precautions:   respiratory status monitored   upright posture maintained  Oral Care: suction provided  Airway Safety Measures:   manual resuscitator/mask at bedside   oxygen flowmeter at bedside   suction at bedside

## 2024-05-13 NOTE — Progress Notes (Signed)
 Palliative Care Progress Note        Consultation from Requesting Attending Physician:  Comer Nat Kays, MD  Primary Care Provider:  Alyse Slater Pao, MD      Assessment/Plan:      SUMMARY:  This 78 y.o. 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    Symptom Assessment and Recommendations:      #Dyspnea 2/2 Acute hypoxemic and hypercarbic respiratory failure: Ongoing, currently trach/vent dependent  - Vent wean per ROAD team    #Anasarca: Ongoing, particularly in LEs. 2/2 prolonged critical illness, underlying cirrhosis, and hypoalbuminemia. Complicated by likely cirrhosis.  - Agree with ongoing diuresis as able  - Cont TFs via NGT - family would choose PEG if necessary for her continued care    #Acute on Chronic Encephalopathy iso MetALD: Persistent. Multi-factorial in setting of cirrhosis, infection, respiratory failure and baseline microvascular changes in brain.   - Continue lactulose  - very important to family that she continue to have sufficient BMs  - Cont delirium precautions    Goals of care / ACP:  Code Status:   Code Status: Full Code   Healthcare decision-maker if lacks capacity:   HCDM (patient stated preference): Hostetter,[name] - Daughter - [phone]    HCDM (patient stated preference): Henes,[name] - Spouse - [phone]     Goals of care as discussed as of 12/12:  Spoke with [name] via phone who is pleased with her mother's improvement and moving to stepdown. She understands her neuro status continues to wax and wane but is hopeful for possible continued improvement and wants to continue disease-targeted care to give her more time to recover. She hopes to be able to transfer to [facility] soon. If PEG tube was necessary for continued care, they would choose it.    Goals of care as discussed as of 12/9:  - Rapid response overnight for concern for likely sepsis. Spoke with daughter [name] via phone and at bedside. [name] has been encouraged by her mom's responsiveness when she has been with her at the bedside, where her mom was making eye cntact and mouthing I love you, and is eager to allow her more time to recover. She is hopeful she will be able to improve from this latest setback with antibiotics and support and begin working towards recovery again. Of note, patient's husband died on hospice yesterday, and family has been trying to cope with this loss amid patient's ongoing illness.    Goals of care as discussed as of 12/4:   - Telephone conference with [name], primary team, and palliative.  MRI results shared with [name], noting near resolution of prior diffuse cortical restricted diffusion. Despite this, patinet has not shown any improvement in her neurological exam. We further shared that MRI also showed evidence of chronic microvascular ischemic disease, global parenchymal volume loss, and remote ischemic infarct - all suggestive of more fragile brain at baseline. Primary team also shared update that patinet has been having apneic spells while on the ventilator and has therefore vent settings have been adjsuted to provide more support. [name] shared that when she was with her mom on Sunday (11/30) it seemed that she was more alert and able to blink on command, but  then on Tuesday (12/2) was not able to do so.   GLENWOOD Channel received the above news and understands that medical teams remain worried that we haven't seen improvement in her neuro exam or her respiratory status. Channel would like to see her mom in person again (likely tomorrow) before making any further decisions. We affirmed that and noted there is not urgency to make a quick decision. The next decisions we would face would be whether to place a PEG and then whether to refer to LTAC. Channel affirmed prior wishes that they want to allow patient to have time to recover if it is possible, but if it becomes more clear that functional recovery is not possible or very unlikely, then they may take a different approach.   GLENWOOD Channel also shared that her father enrolled in home hospice yesterday, which has certainly added to the stress of what is going on with her mom's health.       Goals of care as discussed on 11/18:  - [name] shares this has been a tough week as her mom has gotten sicker, and her dad has made it out of rehab and is living with her, requiring a lot of care and supervision.  She is continuing to work as well and care for her children.  - She has heard that her mom's mental status has worsened and she has not been waking up.  She has heard there is concern about her ammonia levels, and potential seizures.  - She is worried, and at the same time holding out hope still that her mom could improve from this  - She is amenable to a family meeting with PC and ICU teams, although unsure of timing due to her work schedule and other responsibilities    Goals of care as discussed on 11/12:  - Please see ACP note from same day for further details.  - In brief, presented family with options to proceed with tracheostomy versus extubation without a plan for reintubation if she were to fail (plan at that time would be to transition to comfort focused care and allow natural death)  - Family affirmed Clarivel's ultimate goals would be to eventually get back home and lead a functional life.  They believe she would be willing to go through a prolonged hospitalization, tracheostomy and rehab if there is some hope of reaching that goal.  ICU team shared that they thought while the road ahead would be long and difficult, but this was definitely possible and that recent indicators have showed that Aprile is slowly improving.  - Patient and family ultimately decided to proceed with tracheostomy    Goals of care as discussed on 11/11:  - Joeanne's family shares this has been a long and difficult hospitalization, with lots of unexpected decompensations  - They share that before this hospitalization, Sukhmani was doing well at home.  She was independent.  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  This is a MAJOR change for her.  - The ICU team shares that in the past, there have been questions from the family about if they would offer extubation and reintubation if indicated for a 5th time (as an alternative to proceeding straight to tracheostomy).  They would likely not offer this due to substantial risks without much benefit.    Practical, Emotional, Spiritual Support Recommendations:  Patient's daughters would like the patient to be included in decision making as much as possible    Recommendations  discussed with primary team via Epic chat    Thank you for this consult. We will sign off. Please page Palliative Care or re-consult if there are any questions or new issues arise.      Subjective:     Recent Events:    Transferred to stepdown. Spoke with daughter via phone who is planning to visit tomorrow. See above for further details of the visit.    Objective:       Function:  30% - Ambulation: Totally Bed Bound / Unable to do any work, extensive disease / Self-Care: Total care / Intake: Reduced / Level of Conscious: Full, drowsy, or confusion    Temp:  [35.9 ??C (96.7 ??F)-37.3 ??C (99.1 ??F)] 36.7 ??C (98.1 ??F)  Pulse:  [84-104] 86  SpO2 Pulse:  [83-105] 83  Resp:  [13-34] 20  BP: (127-162)/(50-87) 127/82  FiO2 (%):  [25 %-100 %] 30 %  SpO2:  [94 %-100 %] 100 %    Physical Exam:  General:  Ill appearing woman in NAD  HEENT:  s/p trach  Cardiovascular:  No evidence of cyanosis, RRR  Pulmonary: normal WOB  Gastrointestinal: Soft, mildly distended  Ext:  Persistent pitting edema bilateral LE, also present in UE  Neuro: Opens eyes to voice today, nod yes/no to simple questions but not able to follow any simple commands, moving UEs spontaneously    Testing reviewed and interpreted:  Reviewed and interpreted test results for cbc with anemia; eGFR >90, low albumin  affecting assessment of underlying illness severity and prognosis    I personally spent 35 minutes face-to-face and non-face-to-face in the care of this patient, which includes all pre, intra, and post visit time on the date of service.  All documented time was specific to the E/M visit and does not include any procedures that may have been performed.     See ACP Note from today for additional billable service: No    Mardy Abe, MD  Hospice/Palliative Care

## 2024-05-13 NOTE — Consults (Signed)
 Care Management  Initial Transition Planning Assessment    Patient lives with daughter [name] ( granddaughter), nephew and his wife in a three level (split home w/ basement)  in [place] with 9 steps ( back entrance ) to enter . Per daughter, pt was sleeping on cough using half bath down stairs. Pt's bedroom is on upper level. Pt previously had suction shower grab bar, shower chair. No current DME to pt. Pt is planning to transfer to [facility] with transport via bls if auth approved. Daughter [name] lives with pt and will be assisting pt with basic care at home.               General  Care Manager / Social Worker assessed the patient by : Telephone conversation with family (Spoke with [name] daughter)  Orientation Level: Other (Comment) (UTA)  Functional level prior to admission: Independent  Reason for referral: Discharge Planning    Contact/Decision [facility]  Extended Emergency Contact Information  Primary Emergency Contact: Gebhart,[name]  Address: [address]           [place], [place] [address] [place]  of [employer] Phone: [phone]  Relation: Daughter  Secondary Emergency Contact: Howard,Wendy  Address: [address]           [place], [place] [address] [place] [employer]: [phone]  Relation: Daughter  Type of Residence: three level home ( basement)  Contacts: Accompanied by: Alone  Patient Phone Number:   Telephone Information:   Mobile [phone]            Medical Provider(s): Alyse Slater Pao, MD  Reason for Admission: Admitting Diagnosis:  Atrial fibrillation, unspecified type    (CMS-HCC) [I48.91]  Past Medical History:   has a past medical history of A-fib (CMS-HCC), Alcoholism    (CMS-HCC), Alcoholism /alcohol abuse, Cirrhosis    (CMS-HCC), Depression, Hypertension, Liver disease, and Malignant neoplasm of overlapping sites of right breast in female, estrogen receptor positive    (CMS-HCC) (10/03/2020).  Past Surgical History:   has a past surgical history that includes Breast biopsy (Right); Cholecystectomy; pr upper gi endoscopy,biopsy (N/A, 02/03/2018); Tubal ligation; pr mastectomy, partial (Right, 09/03/2020); pr bx/remv,lymph node,deep axill (Right, 09/03/2020); pr intraoperative sentinel lymph node id w dye injection (Right, 09/03/2020); Breast lumpectomy (Right); Radiation (Right); Breast biopsy (Right, 07/2020); pr cardioversion, elective;extern (N/A, 03/10/2024); pr echo heart,transesophageal,complete (Midline, 03/10/2024); central line (03/25/2024); and pr tracheostomy, planned (N/A, 04/15/2024).   Previous admit date: 03/01/2024    Primary Insurance- Payor: [employer] MEDICARE ADV / Plan: [employer] MEDICARE ADV / Product Type: *No Product type* /   Secondary Insurance - None  Prescription Coverage - UHC  Preferred Pharmacy - [facility]  [facility] [phone] - [place], [place] - [address] [place] [address]  [facility]    Transportation home: [employer] Next of Kin / Guardian / POA / Advance Directives     HCDM (patient stated preference): Fritts,[name] - Daughter - [phone]    HCDM (patient stated preference): Kayla Harvey - Daughter - [phone]    Advance Directive (Medical Treatment)  Does patient have an advance directive covering medical treatment?: Patient has advance directive covering medical treatment, copy not in chart.  Advance directive covering medical treatment not in Chart:: Copy requested from family    Health Care Decision Maker [HCDM] (Medical &  Mental Health Treatment)  Healthcare Decision Maker: HCDM documented in the HCDM/Contact Info section.  Information offered on HCDM, Medical & Mental Health advance directives:: Patient given information.    Advance Directive (Mental Health Treatment)  Does patient have an advance directive covering mental health treatment?: Patient does not have advance directive covering mental health treatment.  Reason patient does not have an advance directive covering mental health treatment:: HCDM documented in the HCDM/Contact Info section.    Readmission Information    Have you been hospitalized in the last 30 days?: No  Name of Hospital: [facility]  Were you being cared for at a skilled nursing facility:: No     What day were you discharged from that hospital or facility?: 02/29/24  Number of Days between previous discharge and readmission date: 8-14 days           Did the following happen with your discharge?        Patient Information  Lives with: Children, Family members (Daughter, granddaughter, nephew and his wife)    Type of Residence: Private residence        Location/Detail: Patient address [address]  [place] [address]    Support Systems/Concerns: Spouse, Children    Responsibilities/Dependents at home?: Other (Comment) (see above)    Home Care services in place prior to admission?: No  Type of Home Care services in place prior to admission: Home OT, Home PT  Current Home Care provider (Name/Phone #): [facility]            Equipment Currently Used at Home: none (per daughter pt had no dme)       Currently receiving outpatient dialysis?: No       Financial Information       Need for financial assistance?: No       Social Drivers of Health  Social Drivers of Health were addressed in provider documentation.  Please refer to patient history.    Complex Discharge Information    Is patient identified as a difficult/complex discharge?: No    Interventions: Initial assessment is complete       Discharge Needs Assessment  Concerns to be Addressed: discharge planning    Clinical Risk Factors: > 65, Hospital aquired conditions, Functional Limitations    Barriers to taking medications: No    Prior overnight hospital stay or ED visit in last 90 days: No         Patient's Choice of Community Agency(s): [facility] - referral for [facility] in [place]    Anticipated Changes Related to Illness: inability to care for self, other (see comments) (Likely will need time to recover to resume prior level of functioning.)    Equipment Needed After Discharge:  (CM will follow for DME needs)    Discharge Facility/Level of Care Needs: other (see comments) (CM to follow for DC needs)    Readmission  Risk of Unplanned Readmission Score: UNPLANNED READMISSION SCORE: 33.85%  Predictive Model Details          34% (High)  Factor Value    Calculated 05/13/2024 12:07 20% Current length of stay More than 30 days    [facility] Risk of Unplanned Readmission Model 12% Number of active inpatient medication orders 25     8% Charlson Comorbidity Index 10     7% Diagnosis of cancer present     7% ECG/EKG order present in last 6 months     6% Latest calcium  low (8.3 mg/dL)  5% Diagnosis of electrolyte disorder present     5% Restraint order present in last 6 months     5% Imaging order present in last 6 months     4% Age 78     4% Latest hemoglobin low (7.8 g/dL)     4% Phosphorous result present     4% Number of ED visits in last six months 1     3% Number of hospitalizations in last year 1     3% Active anticoagulant inpatient medication order present     1% Future appointment scheduled     1% Active ulcer inpatient medication order present      Readmitted Within the Last 30 Days? (No if blank) Yes  Patient at risk for readmission?: Yes    Discharge Plan  Screen findings are: Discharge planning needs identified or anticipated (Comment). (CM to follow)    Expected Discharge Date: 05/16/2024    Expected Transfer from Critical Care: 05/12/24    Quality data for continuing care services shared with patient and/or representative?: Yes  Patient and/or family were provided with choice of facilities / services that are available and appropriate to meet post hospital care needs?: Yes   List choices in order highest to lowest preferred, if applicable. : [facility] referral    Initial Assessment complete?: Yes    Darice Pyo, RN, MSN  Case Manager  [phone]

## 2024-05-14 LAB — BLOOD GAS, VENOUS
BASE EXCESS VENOUS: 4.3 — ABNORMAL HIGH (ref -2.0–2.0)
CARBOXYHEMOGLOBIN, VENOUS: 1.4 % — ABNORMAL HIGH (ref <1.2)
HCO3 VENOUS: 29 mmol/L — ABNORMAL HIGH (ref 22–27)
METHEMOGLOBIN, VENOUS: <1 % (ref <1.5)
O2 SATURATION VENOUS: 54.4 % (ref 40.0–85.0)
OXYHEMOGLOBIN, VENOUS: 53.4 % (ref 40.0–85.0)
PCO2 VENOUS: 44 mmHg (ref 40–60)
PH VENOUS: 7.43 (ref 7.32–7.43)
PO2 VENOUS: 35 mmHg (ref 30–55)

## 2024-05-14 LAB — BASIC METABOLIC PANEL
ANION GAP: 10 mmol/L (ref 5–14)
BLOOD UREA NITROGEN: 17 mg/dL (ref 9–23)
BUN / CREAT RATIO: 46
CALCIUM: 8.3 mg/dL — ABNORMAL LOW (ref 8.7–10.4)
CHLORIDE: 108 mmol/L — ABNORMAL HIGH (ref 98–107)
CO2: 30 mmol/L (ref 20.0–31.0)
CREATININE: 0.37 mg/dL — ABNORMAL LOW (ref 0.55–1.02)
EGFR CKD-EPI (2021) FEMALE: >90 mL/min/1.73m2 (ref >=60)
GLUCOSE RANDOM: 135 mg/dL (ref 70–179)
POTASSIUM: 3.5 mmol/L (ref 3.4–4.8)
SODIUM: 148 mmol/L — ABNORMAL HIGH (ref 135–145)

## 2024-05-14 MED ADMIN — rifAXIMin (XIFAXAN) oral suspension: 550 mg | GASTROENTERAL | @ 02:00:00 | Stop: 2025-04-11

## 2024-05-14 MED ADMIN — rifAXIMin (XIFAXAN) oral suspension: 550 mg | GASTROENTERAL | @ 15:00:00 | Stop: 2025-04-11

## 2024-05-14 MED ADMIN — lactulose oral solution: 30 g | GASTROENTERAL | @ 19:00:00

## 2024-05-14 MED ADMIN — lactulose oral solution: 30 g | GASTROENTERAL | @ 15:00:00

## 2024-05-14 MED ADMIN — folic acid (FOLVITE) tablet 1 mg: 1 mg | GASTROENTERAL | @ 15:00:00

## 2024-05-14 MED ADMIN — thiamine mononitrate (vit B1) tablet 100 mg: 100 mg | GASTROENTERAL | @ 15:00:00

## 2024-05-14 MED ADMIN — ergocalciferol (DRISDOL) oral drops 200 mcg/mL (8,000 unit/mL): 100 ug | GASTROENTERAL | @ 15:00:00 | Stop: 2024-05-21

## 2024-05-14 MED ADMIN — cyanocobalamin (vitamin B-12) tablet 1,000 mcg: 1000 ug | GASTROENTERAL | @ 15:00:00

## 2024-05-14 MED ADMIN — levETIRAcetam (KEPPRA) tablet 750 mg: 750 mg | GASTROENTERAL | @ 02:00:00

## 2024-05-14 MED ADMIN — levETIRAcetam (KEPPRA) tablet 750 mg: 750 mg | GASTROENTERAL | @ 15:00:00

## 2024-05-14 MED ADMIN — sodium chloride 3 % NEBULIZER solution 4 mL: 4 mL | RESPIRATORY_TRACT | @ 17:00:00 | Stop: 2024-05-15

## 2024-05-14 MED ADMIN — sodium chloride 3 % NEBULIZER solution 4 mL: 4 mL | RESPIRATORY_TRACT | @ 22:00:00 | Stop: 2024-05-15

## 2024-05-14 MED ADMIN — famotidine (PEPCID) tablet 20 mg: 20 mg | GASTROENTERAL | @ 15:00:00

## 2024-05-14 MED ADMIN — melatonin tablet 3 mg: 3 mg | GASTROENTERAL | @ 23:00:00

## 2024-05-14 MED ADMIN — apixaban (ELIQUIS) tablet 5 mg: 5 mg | GASTROENTERAL | @ 02:00:00

## 2024-05-14 MED ADMIN — apixaban (ELIQUIS) tablet 5 mg: 5 mg | GASTROENTERAL | @ 15:00:00

## 2024-05-14 MED ADMIN — multivitamins, therapeutic with minerals tablet 1 tablet: 1 | GASTROENTERAL | @ 15:00:00

## 2024-05-14 MED ADMIN — metoPROLOL tartrate (Lopressor) tablet 25 mg: 25 mg | GASTROENTERAL | @ 02:00:00

## 2024-05-14 MED ADMIN — metoPROLOL tartrate (Lopressor) tablet 25 mg: 25 mg | GASTROENTERAL | @ 15:00:00

## 2024-05-14 MED ADMIN — lacosamide (VIMPAT) tablet 100 mg: 100 mg | GASTROENTERAL | @ 02:00:00

## 2024-05-14 MED ADMIN — lacosamide (VIMPAT) tablet 100 mg: 100 mg | GASTROENTERAL | @ 15:00:00

## 2024-05-14 MED ADMIN — ipratropium-albuterol (DUO-NEB) 0.5-2.5 mg/3 mL nebulizer solution 3 mL: 3 mL | RESPIRATORY_TRACT | @ 21:00:00

## 2024-05-14 MED ADMIN — ipratropium-albuterol (DUO-NEB) 0.5-2.5 mg/3 mL nebulizer solution 3 mL: 3 mL | RESPIRATORY_TRACT | @ 17:00:00

## 2024-05-14 MED ADMIN — piperacillin-tazobactam (ZOSYN) IVPB (premix) 4.5 g: 4.5 g | INTRAVENOUS | @ 19:00:00 | Stop: 2024-05-15

## 2024-05-14 MED ADMIN — piperacillin-tazobactam (ZOSYN) IVPB (premix) 4.5 g: 4.5 g | INTRAVENOUS | @ 11:00:00 | Stop: 2024-05-15

## 2024-05-14 MED ADMIN — piperacillin-tazobactam (ZOSYN) IVPB (premix) 4.5 g: 4.5 g | INTRAVENOUS | @ 02:00:00 | Stop: 2024-05-15

## 2024-05-14 MED ADMIN — furosemide (LASIX) injection 40 mg: 40 mg | INTRAVENOUS | @ 17:00:00 | Stop: 2024-05-14

## 2024-05-14 NOTE — Plan of Care (Signed)
 Pt remains on PRVC 380mL/+10/14/28% and is tolerating settings well.  All treatments completed as scheduled.  Trach care completed X2.  BS diminished/rhonchi.  Sx large thick yellow secretions.  All emergency airway supplies present at the [facility].  Will continue to monitor.

## 2024-05-14 NOTE — Consults (Signed)
 WIND Team Follow Up Consult Note     Date of Service: 05/14/2024  Requesting Physician: [name] Nat Kays, MD   Requesting Service: Med General [facility] (MDW)  Reason for consultation: Comprehensive evaluation of tracheostomy management and mechanical ventilation.    Hospital Problems:  Principal Problem:    Bacteremia, escherichia coli  Active Problems:    Alcoholic liver disease (HHS-HCC)    HTN (hypertension)    Depression with anxiety    Class 2 severe obesity with serious comorbidity in adult    Atrial fibrillation    (CMS-HCC)    Pleural effusion    Hypernatremia    Thrombocytopenia    Cirrhosis    (CMS-HCC)    A-fib (CMS-HCC)    Hepatic encephalopathy    (CMS-HCC)    Anaphylaxis      HPI: [name] is a 78 y.o. 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 Has had numerous transfer between ICU and MPCU, most recently leaving the MICU on 12/12 after mgmt septic shock 2/2 PsA cellulitis vs PNA      Recommendations       Planned Vent Settings/Goals:  - Reasonable to continue trying pressure support trials; although it seems her ability to durable remain on them has been limited by apnea. Might be more effective if trials occur during day time hours.   - Limit CNS depressing agents if able given apnea   - Ultimately would like to trial on [facility] however mental status is of concern and limiting her ability to wean.     - Tracheostomy  6 shiley  - Barriers to wean include mental status    #Nutrition Plan:   Continue enteral nutrition.    #Speech Plan:  Speaking valve when off vent. Still not ready    #Physical Therapy plan:   Mobilize as tolerated.           This patient was seen and evaluated with Dr.Dover. Please do not hesitate to page [phone] Monday - Friday from 8AM-3PM with questions. We appreciate the opportunity to assist in the care of this patient. The recommendations outlined in this note were discussed w the primary team via epic chat. We look forward to following with you.     [name] JULIANNA Dandy, MD    Assessment          Impression: The patient has acute illness with systemic symptoms. My interpretation of the interval data I personally reviewed, interpreted and/or analyzed is notable for vent settings with FiO2 25%, PSV 14/5 with good oxygenation and gas exchange..      Problems addressed during this consult include Vent mgmt.       Subjective & Objective     Subjective: More interactive than when in MICU; getting her hair braided, did smile.          Vitals - past 24 hours  Temp:  [36.1 ??C (96.9 ??F)-36.8 ??C (98.2 ??F)] 36.8 ??C (98.2 ??F)  Pulse:  [84-102] 102  SpO2 Pulse:  [83-96] 93  Resp:  [14-28] 25  BP: (127-171)/(71-93) 142/77  FiO2 (%):  [30 %] 30 %  SpO2:  [97 %-100 %] 98 % Intake/Output  I/O last 3 completed shifts:  In: 3610 [P.O.:30; NG/GT:3180; IV Piggyback:400]  Out: 2475 [Urine:925; Stool:1550]  Pertinent exam findings:    General - NAD  HEENT - Trachea midline, anicteric  CV - RRR  Resp -PRVC, equal chest rise, coarse breath sounds   GI - abdomen soft, non-tender non-distended  Skin -  no clubbing or cyanosis    Pertinent Imaging Data   - CXR on 04/28/24 revealed bilateral effusions with fluid in the R. Pleural fissure.       Current vent settings:  S RR:  [15] 15  FiO2 (%):  [30 %] 30 %  S VT:  [350 mL] 350 mL  PR SUP:  [14 cm H20] 14 cm H20  O2 Device: Ventilator    Arterial Blood Gas:   No results for input(s): SPECTYPEART, PHART, PCO2ART, PO2ART, HCO3ART, BEART, O2SATART in the last 24 hours.     Venous Blood Gas:   Recent Labs     Units 05/14/24  0522   PHVEN  7.43   PCO2VEN mm Hg 44   PO2VEN mm Hg 35   HCO3VEN mmol/L 29*   BEVEN  4.3*   O2SATVEN % 54.4 Cultures:  Blood Culture, Routine (no units)   Date Value   05/09/2024 No Growth at 4 days   05/09/2024 No Growth at 48 hours     Urine Culture, Comprehensive (no units)   Date Value   05/08/2024 NO GROWTH   04/17/2024 NO GROWTH     Lower Respiratory Culture (no units)   Date Value   05/08/2024 3+ Pseudomonas aeruginosa (A)   05/08/2024 1+ Staphylococcus aureus (A)     WBC (10*9/L)   Date Value   05/13/2024 5.4     WBC, UA (/HPF)   Date Value   05/08/2024 3          Allergies & Home Medications   Personally reviewed in Epic    Medications:  Personally reviewed in Epic, see recommendations for updates           Hospital Course:   - Refer to primary team notes.     Medical Decision Making on 05/14/2024     External Notes:    I reviewed the following: Progress note(s) , Consultant note(s) , Nursing note(s), and Therapist note(s)    Results:   I reviewed the following labs/reports for this consult - CBC unremarkable or unchanged from prior., chemistry unremarkable or unchanged from prior., and radiology reports unremarkable or unchanged from prior.    Tests Ordered:   See recommendations above    Historian: no independent historian required    Imaging: I independently viewed and interpreted the following studies for this consult - CXR images  and CT images       Risks of management:    - Management of ventilator settings.  - Management of airway clearance devices/medications.

## 2024-05-14 NOTE — Plan of Care (Signed)
 Problem: Mechanical Ventilation Invasive  Goal: Mechanical Ventilation Liberation  Outcome: Ongoing - Unchanged     Problem: Artificial Airway  Goal: Optimal Device Function  Outcome: Ongoing - Unchanged  Note: Trach care completed. The pt remains on PRVC 15/350/+5/30% tolerating well. Pt failed SBT again today due to becoming tachypneic and tachycardiac. In line secretions noted to be moderate yellow/thick, needing to be lavaged x1 this shift. The pt could benefit from hypertonic to help thin the secretions. A message has been sent to the MD in regards to this. All emergency supplies remain at the bedside. VSS and NAD noted at this time. Will continue to monitor.   Intervention: Optimize Device Care and Function  Flowsheets  Taken 05/14/2024 9362  Airway/Ventilation Management:   airway patency maintained   calming measures promoted   humidification applied   pulmonary hygiene promoted  Aspiration Precautions:   respiratory status monitored   upright posture maintained  Oral Care: suction provided  Airway Safety Measures:   manual resuscitator/mask at bedside   oxygen flowmeter at bedside   suction at bedside  Taken 05/14/2024 0534  Airway/Ventilation Management:   airway patency maintained   calming measures promoted   humidification applied   pulmonary hygiene promoted  Oral Care: suction provided

## 2024-05-14 NOTE — Plan of Care (Signed)
 Patient drowsy to alert, able to mouth name once this shift, and intermittently following simple commands. On vent at 30%, in Afib, but VSS. FMS intact, moderate UOP today, tube feeds running at goal. Bath and CHG treatment completed. Round and turns completed. Daughter updated via telephone.    Problem: Skin Injury Risk Increased  Goal: Skin Health and Integrity  Intervention: Optimize Skin Protection  Recent Flowsheet Documentation  Taken 05/14/2024 1600 by Rozanna Tinnie BRAVO, RN  Pressure Reduction Techniques:   heels elevated off bed   weight shift assistance provided  Head of Bed [facility]) Positioning: HOB at 30-45 degrees  Taken 05/14/2024 1510 by Rozanna Tinnie BRAVO, RN  Pressure Reduction Techniques:   heels elevated off bed   weight shift assistance provided  Taken 05/14/2024 1400 by Saree Krogh, Tinnie BRAVO, RN  Pressure Reduction Techniques:   heels elevated off bed   weight shift assistance provided  Head of Bed (HOB) Positioning: HOB at 30-45 degrees  Taken 05/14/2024 1200 by Rozanna Tinnie BRAVO, RN  Pressure Reduction Techniques:   heels elevated off bed   weight shift assistance provided  Head of Bed (HOB) Positioning: HOB at 30-45 degrees  Taken 05/14/2024 1140 by Vitaly Wanat E, RN  Pressure Reduction Techniques:   heels elevated off bed   weight shift assistance provided  Taken 05/14/2024 1000 by Kevin Space, Tinnie BRAVO, RN  Pressure Reduction Techniques:   heels elevated off bed   weight shift assistance provided  Head of Bed (HOB) Positioning: HOB at 30-45 degrees  Taken 05/14/2024 0800 by Rozanna Tinnie BRAVO, RN  Pressure Reduction Techniques:   heels elevated off bed   weight shift assistance provided  Head of Bed (HOB) Positioning: HOB at 30-45 degrees  Taken 05/14/2024 0740 by Rozanna Tinnie BRAVO, RN  Pressure Reduction Techniques:   heels elevated off bed   weight shift assistance provided  Pressure Reduction Devices:   pressure-redistributing mattress utilized   positioning supports utilized  Skin Protection: cleansing with dimethicone incontinence wipes   incontinence pads utilized   zinc  oxide barrier cream     Problem: Adult Inpatient Plan of Care  Goal: Absence of Hospital-Acquired Illness or Injury  Intervention: Identify and Manage Fall Risk  Recent Flowsheet Documentation  Taken 05/14/2024 0800 by Rozanna Tinnie BRAVO, RN  Safety Interventions:   aspiration precautions   bleeding precautions   fall reduction program maintained   lighting adjusted for tasks/safety   low bed   infection management   no IV/BP/blood draw left arm   supervised activity  Intervention: Prevent Skin Injury  Recent Flowsheet Documentation  Taken 05/14/2024 1600 by Rozanna Tinnie BRAVO, RN  Positioning for Skin: Left  Taken 05/14/2024 1510 by Rozanna Tinnie BRAVO, RN  Positioning for Skin: Left  Taken 05/14/2024 1400 by Rozanna Tinnie BRAVO, RN  Positioning for Skin: Right  Taken 05/14/2024 1200 by Rozanna Tinnie BRAVO, RN  Positioning for Skin: Left  Taken 05/14/2024 1140 by Rozanna Tinnie BRAVO, RN  Positioning for Skin: Left  Taken 05/14/2024 1000 by Rozanna Tinnie BRAVO, RN  Positioning for Skin: Right  Taken 05/14/2024 0800 by Rozanna Tinnie BRAVO, RN  Positioning for Skin: Left  Taken 05/14/2024 0740 by Rozanna Tinnie BRAVO, RN  Positioning for Skin: Left  Skin Protection:   cleansing with dimethicone incontinence wipes   incontinence pads utilized   zinc  oxide barrier cream  Intervention: Prevent Infection  Recent Flowsheet Documentation  Taken 05/14/2024 0800 by Rozanna Tinnie BRAVO, RN  Infection Prevention:   environmental surveillance performed  equipment surfaces disinfected   rest/sleep promoted   hand hygiene promoted   personal protective equipment utilized   single patient room provided     Problem: Breathing Pattern Ineffective  Goal: Effective Breathing Pattern  Intervention: Promote Improved Breathing Pattern  Recent Flowsheet Documentation  Taken 05/14/2024 1600 by Rozanna Tinnie BRAVO, RN  Head of Bed [facility]) Positioning: HOB at 30-45 degrees  Taken 05/14/2024 1400 by Rozanna Tinnie BRAVO, RN  Head of Bed [facility]) Positioning: HOB at 30-45 degrees  Taken 05/14/2024 1200 by Rozanna Tinnie BRAVO, RN  Head of Bed [facility]) Positioning: HOB at 30-45 degrees  Taken 05/14/2024 1000 by Rozanna Tinnie BRAVO, RN  Head of Bed [facility]) Positioning: HOB at 30-45 degrees  Taken 05/14/2024 0800 by Rozanna Tinnie BRAVO, RN  Head of Bed [facility]) Positioning: HOB at 30-45 degrees     Problem: Fall Injury Risk  Goal: Absence of Fall and Fall-Related Injury  Intervention: Promote Injury-Free Environment  Recent Flowsheet Documentation  Taken 05/14/2024 0800 by Rozanna Tinnie BRAVO, RN  Safety Interventions:   aspiration precautions   bleeding precautions   fall reduction program maintained   lighting adjusted for tasks/safety   low bed   infection management   no IV/BP/blood draw left arm   supervised activity     Problem: Gas Exchange Impaired  Goal: Optimal Gas Exchange  Intervention: Optimize Oxygenation and Ventilation  Recent Flowsheet Documentation  Taken 05/14/2024 1600 by Rozanna Tinnie BRAVO, RN  Head of Bed [facility]) Positioning: HOB at 30-45 degrees  Taken 05/14/2024 1400 by Rozanna Tinnie BRAVO, RN  Head of Bed [facility]) Positioning: HOB at 30-45 degrees  Taken 05/14/2024 1200 by Rozanna Tinnie BRAVO, RN  Head of Bed [facility]) Positioning: HOB at 30-45 degrees  Taken 05/14/2024 1000 by Rozanna Tinnie BRAVO, RN  Head of Bed [facility]) Positioning: HOB at 30-45 degrees  Taken 05/14/2024 0800 by Rozanna Tinnie BRAVO, RN  Head of Bed [facility]) Positioning: HOB at 30-45 degrees     Problem: Non-Violent Restraints  Intervention: Patient Monitoring  Recent Flowsheet Documentation  Taken 05/14/2024 1600 by Rozanna Tinnie BRAVO, RN  Psychological Status/Visual Check: Confused  Circulation/Skin Integrity: No signs of injury  Range of Motion: Performed  Fluids: NPO  Food/Meal: Enteral feeding/TPN  Elimination: Rectal tube  Taken 05/14/2024 1400 by Rozanna Tinnie BRAVO, RN  Psychological Status/Visual Check: Confused  Circulation/Skin Integrity: No signs of injury  Range of Motion: Performed  Fluids: NPO  Food/Meal: Enteral feeding/TPN  Elimination: Rectal tube  Taken 05/14/2024 1200 by Rozanna Tinnie BRAVO, RN  Psychological Status/Visual Check: Confused  Circulation/Skin Integrity: No signs of injury  Range of Motion: Performed  Fluids: NPO  Food/Meal: Enteral feeding/TPN  Elimination: Rectal tube  Taken 05/14/2024 1000 by Rozanna Tinnie BRAVO, RN  Psychological Status/Visual Check: Confused  Circulation/Skin Integrity: No signs of injury  Range of Motion: Performed  Fluids: NPO  Food/Meal: Enteral feeding/TPN  Elimination: Rectal tube  Taken 05/14/2024 0800 by Rozanna Tinnie BRAVO, RN  Psychological Status/Visual Check:   Agitated/restless   Confused  Circulation/Skin Integrity: No signs of injury  Range of Motion: Performed  Fluids: NPO  Food/Meal: Enteral feeding/TPN  Elimination: Rectal tube  Intervention: Patient Education  Recent Flowsheet Documentation  Taken 05/14/2024 1000 by Rozanna Tinnie BRAVO, RN  Criteria Explained: Yes  Patient's Response: No evidence of learning     Problem: Wound  Goal: Absence of Infection Signs and Symptoms  Intervention: Prevent or Manage Infection  Recent Flowsheet Documentation  Taken 05/14/2024 0800 by Rozanna Tinnie  E, RN  Infection Management: aseptic technique maintained  Goal: Skin Health and Integrity  Intervention: Optimize Skin Protection  Recent Flowsheet Documentation  Taken 05/14/2024 1600 by Rozanna Tinnie BRAVO, RN  Pressure Reduction Techniques:   heels elevated off bed   weight shift assistance provided  Head of Bed [facility]) Positioning: HOB at 30-45 degrees  Taken 05/14/2024 1510 by Rozanna Tinnie BRAVO, RN  Pressure Reduction Techniques:   heels elevated off bed   weight shift assistance provided  Taken 05/14/2024 1400 by Erykah Lippert E, RN  Pressure Reduction Techniques:   heels elevated off bed   weight shift assistance provided  Head of Bed (HOB) Positioning: HOB at 30-45 degrees  Taken 05/14/2024 1200 by Rozanna Tinnie BRAVO, RN  Pressure Reduction Techniques:   heels elevated off bed   weight shift assistance provided  Head of Bed (HOB) Positioning: HOB at 30-45 degrees  Taken 05/14/2024 1140 by Jerrold Haskell E, RN  Pressure Reduction Techniques:   heels elevated off bed   weight shift assistance provided  Taken 05/14/2024 1000 by Zyshawn Bohnenkamp, Tinnie BRAVO, RN  Pressure Reduction Techniques:   heels elevated off bed   weight shift assistance provided  Head of Bed (HOB) Positioning: HOB at 30-45 degrees  Taken 05/14/2024 0800 by Rozanna Tinnie BRAVO, RN  Pressure Reduction Techniques:   heels elevated off bed   weight shift assistance provided  Head of Bed (HOB) Positioning: HOB at 30-45 degrees  Taken 05/14/2024 0740 by Rozanna Tinnie BRAVO, RN  Pressure Reduction Techniques:   heels elevated off bed   weight shift assistance provided  Pressure Reduction Devices:   pressure-redistributing mattress utilized   positioning supports utilized  Skin Protection:   cleansing with dimethicone incontinence wipes   incontinence pads utilized   zinc  oxide barrier cream     Problem: Mechanical Ventilation Invasive  Goal: Optimal Device Function  Intervention: Optimize Device Care and Function  Recent Flowsheet Documentation  Taken 05/14/2024 1600 by Rozanna Tinnie BRAVO, RN  Oral Care:   mouth swabbed   tongue brushed   suction provided  Taken 05/14/2024 1200 by Rozanna Tinnie BRAVO, RN  Oral Care:   tongue brushed   mouth swabbed   oral rinse provided   suction provided  Taken 05/14/2024 1000 by Rozanna Tinnie BRAVO, RN  Oral Care: mouth swabbed  Taken 05/14/2024 0800 by Rozanna Tinnie BRAVO, RN  Oral Care:   mouth swabbed   oral rinse provided   suction provided   tongue brushed  Goal: Absence of Ventilator-Induced Lung Injury  Intervention: Prevent Ventilator-Associated Pneumonia  Recent Flowsheet Documentation  Taken 05/14/2024 1600 by Rozanna Tinnie BRAVO, RN  Head of Bed [facility]) Positioning: HOB at 30-45 degrees  Oral Care:   mouth swabbed   tongue brushed suction provided  Taken 05/14/2024 1400 by Rozanna Tinnie BRAVO, RN  Head of Bed [facility]) Positioning: HOB at 30-45 degrees  Taken 05/14/2024 1200 by Rozanna Tinnie BRAVO, RN  Head of Bed [facility]) Positioning: HOB at 30-45 degrees  Oral Care:   tongue brushed   mouth swabbed   oral rinse provided   suction provided  Taken 05/14/2024 1000 by Rozanna Tinnie BRAVO, RN  Head of Bed [facility]) Positioning: HOB at 30-45 degrees  Oral Care: mouth swabbed  Taken 05/14/2024 0800 by Rozanna Tinnie BRAVO, RN  Head of Bed [facility]) Positioning: HOB at 30-45 degrees  Oral Care:   mouth swabbed   oral rinse provided   suction provided   tongue brushed  Problem: Mechanical Ventilation Invasive  Goal: Optimal Device Function  Intervention: Optimize Device Care and Function  Recent Flowsheet Documentation  Taken 05/14/2024 1600 by Charniece Venturino E, RN  Oral Care:   mouth swabbed   tongue brushed   suction provided  Taken 05/14/2024 1200 by Rozanna Tinnie BRAVO, RN  Oral Care:   tongue brushed   mouth swabbed   oral rinse provided   suction provided  Taken 05/14/2024 1000 by Deaundra Kutzer E, RN  Oral Care: mouth swabbed  Taken 05/14/2024 0800 by Rozanna Tinnie BRAVO, RN  Oral Care:   mouth swabbed   oral rinse provided   suction provided   tongue brushed  Goal: Absence of Ventilator-Induced Lung Injury  Intervention: Prevent Ventilator-Associated Pneumonia  Recent Flowsheet Documentation  Taken 05/14/2024 1600 by Rozanna Tinnie BRAVO, RN  Head of Bed [facility]) Positioning: HOB at 30-45 degrees  Oral Care:   mouth swabbed   tongue brushed   suction provided  Taken 05/14/2024 1400 by Rozanna Tinnie BRAVO, RN  Head of Bed [facility]) Positioning: HOB at 30-45 degrees  Taken 05/14/2024 1200 by Rozanna Tinnie BRAVO, RN  Head of Bed [facility]) Positioning: HOB at 30-45 degrees  Oral Care:   tongue brushed   mouth swabbed   oral rinse provided   suction provided  Taken 05/14/2024 1000 by Kinsley Holderman, Tinnie BRAVO, RN  Head of Bed [facility]) Positioning: HOB at 30-45 degrees  Oral Care: mouth swabbed  Taken 05/14/2024 0800 by Rozanna Tinnie BRAVO, RN  Head of Bed [facility]) Positioning: HOB at 30-45 degrees  Oral Care:   mouth swabbed   oral rinse provided   suction provided   tongue brushed     Problem: Mechanical Ventilation Invasive  Goal: Optimal Device Function  Intervention: Optimize Device Care and Function  Recent Flowsheet Documentation  Taken 05/14/2024 1600 by Mikeal Winstanley E, RN  Oral Care:   mouth swabbed   tongue brushed   suction provided  Taken 05/14/2024 1200 by Krystalle Pilkington E, RN  Oral Care:   tongue brushed   mouth swabbed   oral rinse provided   suction provided  Taken 05/14/2024 1000 by Amiyrah Lamere E, RN  Oral Care: mouth swabbed  Taken 05/14/2024 0800 by Loys Shugars E, RN  Oral Care:   mouth swabbed   oral rinse provided   suction provided   tongue brushed  Goal: Absence of Ventilator-Induced Lung Injury  Intervention: Prevent Ventilator-Associated Pneumonia  Recent Flowsheet Documentation  Taken 05/14/2024 1600 by Rozanna Tinnie BRAVO, RN  Head of Bed [facility]) Positioning: HOB at 30-45 degrees  Oral Care:   mouth swabbed   tongue brushed   suction provided  Taken 05/14/2024 1400 by Rozanna Tinnie BRAVO, RN  Head of Bed [facility]) Positioning: HOB at 30-45 degrees  Taken 05/14/2024 1200 by Rozanna Tinnie BRAVO, RN  Head of Bed [facility]) Positioning: HOB at 30-45 degrees  Oral Care:   tongue brushed   mouth swabbed   oral rinse provided   suction provided  Taken 05/14/2024 1000 by Rozanna Tinnie BRAVO, RN  Head of Bed [facility]) Positioning: HOB at 30-45 degrees  Oral Care: mouth swabbed  Taken 05/14/2024 0800 by Rozanna Tinnie BRAVO, RN  Head of Bed [facility]) Positioning: HOB at 30-45 degrees  Oral Care:   mouth swabbed   oral rinse provided   suction provided   tongue brushed     Problem: Mechanical Ventilation Invasive  Goal: Optimal Device Function  Intervention: Optimize Device Care and Function  Recent  Flowsheet Documentation  Taken 05/14/2024 1600 by Rozanna Tinnie BRAVO, RN  Oral Care:   mouth swabbed tongue brushed   suction provided  Taken 05/14/2024 1200 by Rozanna Tinnie BRAVO, RN  Oral Care:   tongue brushed   mouth swabbed   oral rinse provided   suction provided  Taken 05/14/2024 1000 by Rozanna Tinnie BRAVO, RN  Oral Care: mouth swabbed  Taken 05/14/2024 0800 by Rozanna Tinnie BRAVO, RN  Oral Care:   mouth swabbed   oral rinse provided   suction provided   tongue brushed  Goal: Absence of Ventilator-Induced Lung Injury  Intervention: Prevent Ventilator-Associated Pneumonia  Recent Flowsheet Documentation  Taken 05/14/2024 1600 by Rozanna Tinnie BRAVO, RN  Head of Bed [facility]) Positioning: HOB at 30-45 degrees  Oral Care:   mouth swabbed   tongue brushed   suction provided  Taken 05/14/2024 1400 by Rozanna Tinnie BRAVO, RN  Head of Bed [facility]) Positioning: HOB at 30-45 degrees  Taken 05/14/2024 1200 by Rozanna Tinnie BRAVO, RN  Head of Bed [facility]) Positioning: HOB at 30-45 degrees  Oral Care:   tongue brushed   mouth swabbed   oral rinse provided   suction provided  Taken 05/14/2024 1000 by Derryck Shahan, Tinnie BRAVO, RN  Head of Bed [facility]) Positioning: HOB at 30-45 degrees  Oral Care: mouth swabbed  Taken 05/14/2024 0800 by Rozanna Tinnie BRAVO, RN  Head of Bed [name]) Positioning: HOB at 30-45 degrees  Oral Care:   mouth swabbed   oral rinse provided   suction provided   tongue brushed     Problem: Infection  Goal: Absence of Infection Signs and Symptoms  Intervention: Prevent or Manage Infection  Recent Flowsheet Documentation  Taken 05/14/2024 0800 by Rozanna Tinnie BRAVO, RN  Infection Management: aseptic technique maintained     Problem: Mechanical Ventilation Invasive  Goal: Optimal Device Function  Intervention: Optimize Device Care and Function  Recent Flowsheet Documentation  Taken 05/14/2024 1600 by Rozanna Tinnie BRAVO, RN  Oral Care:   mouth swabbed   tongue brushed   suction provided  Taken 05/14/2024 1200 by Jahmeir Geisen E, RN  Oral Care:   tongue brushed   mouth swabbed   oral rinse provided   suction provided  Taken 05/14/2024 1000 by Katianne Barre E, RN  Oral Care: mouth swabbed  Taken 05/14/2024 0800 by Ariatna Jester E, RN  Oral Care:   mouth swabbed   oral rinse provided   suction provided   tongue brushed  Goal: Absence of Ventilator-Induced Lung Injury  Intervention: Prevent Ventilator-Associated Pneumonia  Recent Flowsheet Documentation  Taken 05/14/2024 1600 by Rozanna Tinnie BRAVO, RN  Head of Bed [facility]) Positioning: HOB at 30-45 degrees  Oral Care:   mouth swabbed   tongue brushed   suction provided  Taken 05/14/2024 1400 by Rozanna Tinnie BRAVO, RN  Head of Bed [facility]) Positioning: HOB at 30-45 degrees  Taken 05/14/2024 1200 by Rozanna Tinnie BRAVO, RN  Head of Bed [facility]) Positioning: HOB at 30-45 degrees  Oral Care:   tongue brushed   mouth swabbed   oral rinse provided   suction provided  Taken 05/14/2024 1000 by Rozanna Tinnie BRAVO, RN  Head of Bed [facility]) Positioning: HOB at 30-45 degrees  Oral Care: mouth swabbed  Taken 05/14/2024 0800 by Rozanna Tinnie BRAVO, RN  Head of Bed [facility]) Positioning: HOB at 30-45 degrees  Oral Care:   mouth swabbed   oral rinse provided   suction provided   tongue brushed  Problem: Mechanical Ventilation Invasive  Goal: Optimal Device Function  Intervention: Optimize Device Care and Function  Recent Flowsheet Documentation  Taken 05/14/2024 1600 by Jennae Hakeem E, RN  Oral Care:   mouth swabbed   tongue brushed   suction provided  Taken 05/14/2024 1200 by Rozanna Tinnie BRAVO, RN  Oral Care:   tongue brushed   mouth swabbed   oral rinse provided   suction provided  Taken 05/14/2024 1000 by Ysenia Filice E, RN  Oral Care: mouth swabbed  Taken 05/14/2024 0800 by Rozanna Tinnie BRAVO, RN  Oral Care:   mouth swabbed   oral rinse provided   suction provided   tongue brushed  Goal: Absence of Ventilator-Induced Lung Injury  Intervention: Prevent Ventilator-Associated Pneumonia  Recent Flowsheet Documentation  Taken 05/14/2024 1600 by Rozanna Tinnie BRAVO, RN  Head of Bed [facility]) Positioning: HOB at 30-45 degrees  Oral Care: mouth swabbed   tongue brushed   suction provided  Taken 05/14/2024 1400 by Rozanna Tinnie BRAVO, RN  Head of Bed [facility]) Positioning: HOB at 30-45 degrees  Taken 05/14/2024 1200 by Rozanna Tinnie BRAVO, RN  Head of Bed [facility]) Positioning: HOB at 30-45 degrees  Oral Care:   tongue brushed   mouth swabbed   oral rinse provided   suction provided  Taken 05/14/2024 1000 by Kharis Lapenna, Tinnie BRAVO, RN  Head of Bed [facility]) Positioning: HOB at 30-45 degrees  Oral Care: mouth swabbed  Taken 05/14/2024 0800 by Rozanna Tinnie BRAVO, RN  Head of Bed [facility]) Positioning: HOB at 30-45 degrees  Oral Care:   mouth swabbed   oral rinse provided   suction provided   tongue brushed     Problem: Artificial Airway  Goal: Optimal Device Function  Intervention: Optimize Device Care and Function  Recent Flowsheet Documentation  Taken 05/14/2024 1600 by Gabriel Conry E, RN  Oral Care:   mouth swabbed   tongue brushed   suction provided  Taken 05/14/2024 1200 by Shaneequa Bahner E, RN  Oral Care:   tongue brushed   mouth swabbed   oral rinse provided   suction provided  Taken 05/14/2024 1000 by Rozanna Tinnie BRAVO, RN  Oral Care: mouth swabbed  Taken 05/14/2024 0800 by Rozanna Tinnie BRAVO, RN  Aspiration Precautions:   upright posture maintained   respiratory status monitored   oral hygiene care promoted   NPO pending swallow screening/evaluation   tube feeding placement verified  Oral Care:   mouth swabbed   oral rinse provided   suction provided   tongue brushed     Problem: Mechanical Ventilation Invasive  Goal: Optimal Device Function  Intervention: Optimize Device Care and Function  Recent Flowsheet Documentation  Taken 05/14/2024 1600 by Phelan Schadt E, RN  Oral Care:   mouth swabbed   tongue brushed   suction provided  Taken 05/14/2024 1200 by Nikolos Billig E, RN  Oral Care:   tongue brushed   mouth swabbed   oral rinse provided   suction provided  Taken 05/14/2024 1000 by Marvell Tamer E, RN  Oral Care: mouth swabbed  Taken 05/14/2024 0800 by Lecia Esperanza E, RN  Oral Care:   mouth swabbed   oral rinse provided   suction provided   tongue brushed  Goal: Absence of Ventilator-Induced Lung Injury  Intervention: Prevent Ventilator-Associated Pneumonia  Recent Flowsheet Documentation  Taken 05/14/2024 1600 by Rozanna Tinnie BRAVO, RN  Head of Bed [facility]) Positioning: HOB at 30-45 degrees  Oral Care:   mouth swabbed   tongue  brushed   suction provided  Taken 05/14/2024 1400 by Rozanna Tinnie BRAVO, RN  Head of Bed [facility]) Positioning: HOB at 30-45 degrees  Taken 05/14/2024 1200 by Rozanna Tinnie BRAVO, RN  Head of Bed [facility]) Positioning: HOB at 30-45 degrees  Oral Care:   tongue brushed   mouth swabbed   oral rinse provided   suction provided  Taken 05/14/2024 1000 by Rozanna Tinnie BRAVO, RN  Head of Bed [facility]) Positioning: HOB at 30-45 degrees  Oral Care: mouth swabbed  Taken 05/14/2024 0800 by Rozanna Tinnie BRAVO, RN  Head of Bed [facility]) Positioning: HOB at 30-45 degrees  Oral Care:   mouth swabbed   oral rinse provided   suction provided   tongue brushed     Problem: Artificial Airway  Goal: Optimal Device Function  Intervention: Optimize Device Care and Function  Recent Flowsheet Documentation  Taken 05/14/2024 1600 by Anay Rathe E, RN  Oral Care:   mouth swabbed   tongue brushed   suction provided  Taken 05/14/2024 1200 by Sovereign Ramiro E, RN  Oral Care:   tongue brushed   mouth swabbed   oral rinse provided   suction provided  Taken 05/14/2024 1000 by Sadiyah Kangas E, RN  Oral Care: mouth swabbed  Taken 05/14/2024 0800 by Rozanna Tinnie BRAVO, RN  Aspiration Precautions:   upright posture maintained   respiratory status monitored   oral hygiene care promoted   NPO pending swallow screening/evaluation   tube feeding placement verified  Oral Care:   mouth swabbed   oral rinse provided   suction provided   tongue brushed

## 2024-05-14 NOTE — Progress Notes (Signed)
 Internal Medicine (MEDW) Progress Note    Assessment & Plan:   [name] is a 78 y.o. female who is presenting to [facility] with Bacteremia, escherichia coli, in the setting of the following pertinent/contributing co-morbidities: HTN, MetALD cirrhosis, severe obseity, and persistent a fib.    Principal Problem:    Bacteremia, escherichia coli  Active Problems:    Alcoholic liver disease (HHS-HCC)    HTN (hypertension)    Depression with anxiety    Class 2 severe obesity with serious comorbidity in adult    Atrial fibrillation    (CMS-HCC)    Pleural effusion    Hypernatremia    Thrombocytopenia    Cirrhosis    (CMS-HCC)    A-fib (CMS-HCC)    Hepatic encephalopathy    (CMS-HCC)    Anaphylaxis    Brief hospital course:  Patient presented to [facility] initially for encephalopathy following an aborted DCCV (LAA thrombus found). She subsequently had a complex hospital stay with multiple ICU admissions, PEA arrest, multiple intubations and eventually tracheostomy. Currently weaning trach, and providing NGT feeds, but mental status continues to be poor. She was admitted to the MICU May 12, 2024 for hypotension requiring norepinephrine , suspect septic shock 2/2 PsA PNA vs RLE cellulitis.    **Per daughter, she does yet not want to have her mother told that her husband passed away in hospice on 2024/05/12**    Active Problems    GOC   Multiple prior ACP discussions. Appreciate palliative care input. Goal is for functional recovery, unfortunately mental status appears to be very waxing/waning and per dw neuro, prognosis is uncertain and will take a long time to recover if she ever does. Family meeting held 12/4 over the phone, provided updates, daughter still wants to give her some time and would like to see her in person, unfortunately currently has a stomach bug  - Palliative care following, appreciate assistance in continuing to have ongoing GOC conversations    Encephalopathy, multifactorial - Focal non-convulsive seizures  Multiple drivers, prolonged ICU stays, cardiac arrest, metabolic derangements have been corrected, likely some hepatic encephalopathy. Did have subclinical seizures seen on EEG earlier in her course as well. Avoiding centrally acting medications. Intermittently opening eyes to verbal and tactile stimuli.  Of note, has become more encephalopathic in the past if she does not have adequate bowel movements. Mental status improved this morning and patient was alert and tracking and indicating possible discomfort around the trach/NGT. Plan to continue to treat supportively and address possible causes of encephalopathy.  - Continue Lactulose , only hold if >1054ml stool via FMS  - Continue Rifaximin  550 mg twice daily  - Ongoing re-evalaution especially with family at bedside as able  - Discussed with neuro, while MRI Brain 12/3 improved, and could have been 2/2 prolonged subclinical seizures, or PRES, however given lack of improvement in exam findings, toxic metabolic insults also likely contributed, and it is difficult to predict how much and within what time frame she will improve  - Continue Levetiracetam  750mg  BID and Lacosamide  100mg  BID  - Continue delirium precautions     Chronic hypoxemic and hypercarbic respiratory failure - Trach/vent depedent - Apneic events  Initially suspected to be due to encephalopathy as above.  Multiple chest x-rays have shown pleural effusions.  Intubated 10/24-11/14. Failed extubation attempts and tracheostomy placed on 11/14.  Difficulty weaning pressure support and has required very slow decreases as she has become hypercarbic with faster weans.  Given her apneic episodes and poor mechanics, mental status/encephalopathy  appears to be driving vent dependence, although neuro meds do not appear to be contributing due to timing of doses and timing of SBTs. Currently on 30% FiO2.  - Continue trials of PSV/CPAP during day  - Routine trach care per RT  - VBG qAM  - Duonebs, Hypertonic saline    Anasarca  Likely 2/2 prolonged critical illness, underlying cirrhosis, and hypoalbuminemia. Marked pitting edema in bilateral lower extremities ascending to thighs. Diuresis previously held in setting of hypotension, now restarting as blood pressures have improved.    - Start lasix      C/f RLE cellulitis  CT RLE 12/9 with RLE cellulitis without underlying abscess or deep tracking soft tissue gas. S/p vanc. doxycycline  12/7-12/11 (completed course). Last fever 12/9. Skin swab culture positive for <1+ Pseudomonas. Exam with erythema of bilateral thighs, favor edema iso prolonged illness and volume resuscitation - not overtly infectious-appearing on exam. Patient also remains afebrile and clinically stable, will defer further antibiotics unless clinically worsens. BLE PVL 12/4 without signs of DVT.   - Continue to monitor    Ventilator associated pneumonia  CXR 12/8 with pleural effusions and possible consolidation that could represent pneumonia versus aspiration.  Likely secondary to Pseudomonas (as seen on lower respiratory culture) and MSSA. She continues to have increased thick secretions. Last fever 12/9.  - Zosyn  (12/7- )  - [facility] 12/7 (Tracheal aspirate) with 3+ Pseudomonas, 1+ staph aureus  - Trach care per RT    Enteral feeds  NGT in place.  - Nutrition following  - Continue B12, thiamine , folate. MVN    The patient's presentation is complicated by the following clinically significant conditions requiring additional evaluation and treatment: - Hypercoagulable state requiring additional attention to DVT prophylaxis and treatment or chronic anticoagulation secondary to Atrial Fibrillation   - Altered mental status secondary to - Metabolic Encephalopathy POA requiring further investigation or monitoring and - Delirium POA requiring further investigation or monitoring  - Age related debility POA requiring additional resources: DME, PT, or OT  - Hypoxia requiring further investigation, treatment, or monitoring  - Anemia requiring at least daily CBC for further monitoring    Chronic Problems    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 Stable ~8.  - Daily CBC    Persistent AFib - LAA thrombus  Continues to be in Afib. Rate controlled with medication.  - Metop 25mg  BID  - Apixaban  5mg  BID     History of prolonged Qtc  - Regular EKGs  - Most recent EKG 12/5 with QTc Frederica 405    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)   -  Holding home anastrozole     Daily Checklist:  Diet: Tube Feeds  DVT PPx: Patient Already on Full Anticoagulation with eliquis   Electrolytes: Replete Potassium to >/= 3.6 and Magnesium  to >/= 1.8  Code Status: Full Code  Dispo: Transfer to [facility]PCU    Team Contact Information:   Primary Team: Internal Medicine (MEDW)  Primary Resident: Zelda Moose, MD, MD  Resident's Pager: [phone] (Gen MedW Intern - [place])    Interval History:  No acute events overnight.    Patient initially somnolent, but later was alert and tracking with eyes and intermittently moving arms.     Objective:   Temp:  [36.1 ??C (96.9 ??F)-36.8 ??C (98.2 ??F)] 36.8 ??C (98.2 ??F)  Pulse:  [84-102] 102  SpO2 Pulse:  [83-96] 93  Resp:  [14-28] 25  BP: (127-171)/(71-93) 142/77  FiO2 (%):  [30 %] 30 %  SpO2:  [97 %-100 %] 98 %,   Intake/Output Summary (Last 24 hours) at 05/14/2024 0752  Last data filed at 05/14/2024 0740  Gross per 24 hour   Intake 2510 ml   Output 1850 ml   Net 660 ml     Gen: Lying in bed, alert  HEENT: Dry MM, NGT in place with feeds running, trach in place  CV: Regular rate, irregular rhythm, no MRG  Pulm: Rhonchorous breath sounds bilaterally   Abd: Soft, non-tender, normal BS. FMS in place  GU: External catheter in place  Ext: Diffuse edema of bilateral extremities ascending to thighs. LUE PICC in place  Skin: Bilateral thighs erythematous  Neuro: Intermittently follows commands. Withdraws to stimuli

## 2024-05-14 NOTE — Plan of Care (Signed)
 Shift Summary  Pt confused. VS -- SBP 160s, afib rhythm 100s, on ventilator 30%. Pt tolerated all meds this shift. All alarms and monitors in place.  Tracheostomy site care was performed after oozing secretions were observed, with subsequent assessments showing the site remained clean and dry.   Pain assessments consistently indicated no discomfort, with all NAPS scores at 0 and relaxed physical indicators.   Ventilator mode was changed from PRVC to PSV-CPAP during the shift.   MAP values decreased slightly but remained within a moderate range.   Overall, comfort and tracheostomy site condition were maintained throughout the shift.     Optimal Comfort and Wellbeing: Non-verbal pain assessments remained at 0 throughout the shift, with relaxed facial muscles and normal muscle tone observed; tracheostomy site care was performed after oozing secretions were noted, and the site remained clean and dry for the rest of the shift.

## 2024-05-15 DIAGNOSIS — I10 Essential (primary) hypertension: Principal | ICD-10-CM

## 2024-05-15 DIAGNOSIS — K766 Portal hypertension: Principal | ICD-10-CM

## 2024-05-15 LAB — BASIC METABOLIC PANEL
ANION GAP: 11 mmol/L (ref 5–14)
BLOOD UREA NITROGEN: 16 mg/dL (ref 9–23)
BUN / CREAT RATIO: 38
CALCIUM: 8.2 mg/dL — ABNORMAL LOW (ref 8.7–10.4)
CHLORIDE: 108 mmol/L — ABNORMAL HIGH (ref 98–107)
CO2: 29 mmol/L (ref 20.0–31.0)
CREATININE: 0.42 mg/dL — ABNORMAL LOW (ref 0.55–1.02)
EGFR CKD-EPI (2021) FEMALE: >90 mL/min/1.73m2 (ref >=60)
GLUCOSE RANDOM: 141 mg/dL (ref 70–179)
POTASSIUM: 3 mmol/L — ABNORMAL LOW (ref 3.4–4.8)
SODIUM: 148 mmol/L — ABNORMAL HIGH (ref 135–145)

## 2024-05-15 LAB — CBC
HEMATOCRIT: 23 % — ABNORMAL LOW (ref 34.0–44.0)
HEMOGLOBIN: 7.3 g/dL — ABNORMAL LOW (ref 11.3–14.9)
MEAN CORPUSCULAR HEMOGLOBIN CONC: 31.8 g/dL — ABNORMAL LOW (ref 32.0–36.0)
MEAN CORPUSCULAR HEMOGLOBIN: 29.3 pg (ref 25.9–32.4)
MEAN CORPUSCULAR VOLUME: 92.3 fL (ref 77.6–95.7)
MEAN PLATELET VOLUME: 8.6 fL (ref 6.8–10.7)
PLATELET COUNT: 191 10*9/L (ref 150–450)
RED BLOOD CELL COUNT: 2.49 10*12/L — ABNORMAL LOW (ref 3.95–5.13)
RED CELL DISTRIBUTION WIDTH: 18.8 % — ABNORMAL HIGH (ref 12.2–15.2)
WBC ADJUSTED: 5.9 10*9/L (ref 3.6–11.2)

## 2024-05-15 LAB — BLOOD GAS, VENOUS
BASE EXCESS VENOUS: 0.5 (ref -2.0–2.0)
CARBOXYHEMOGLOBIN, VENOUS: 1.4 % — ABNORMAL HIGH (ref <1.2)
HCO3 VENOUS: 24 mmol/L (ref 22–27)
METHEMOGLOBIN, VENOUS: <1 % (ref <1.5)
O2 SATURATION VENOUS: 54.5 % (ref 40.0–85.0)
OXYHEMOGLOBIN, VENOUS: 53.4 % (ref 40.0–85.0)
PCO2 VENOUS: 37 mmHg — ABNORMAL LOW (ref 40–60)
PH VENOUS: 7.44 — ABNORMAL HIGH (ref 7.32–7.43)
PO2 VENOUS: 34 mmHg (ref 30–55)

## 2024-05-15 MED ORDER — CARVEDILOL 3.125 MG TABLET
ORAL_TABLET | Freq: Two times a day (BID) | ORAL | 2 refills | 90.00000 days
Start: 2024-05-15 — End: ?

## 2024-05-15 MED ORDER — FOLIC ACID 1 MG TABLET
ORAL_TABLET | Freq: Every day | ORAL | 90.00000 days
Start: 2024-05-15 — End: ?

## 2024-05-15 MED ORDER — AMLODIPINE 5 MG TABLET
ORAL_TABLET | Freq: Every day | ORAL | 2 refills | 90.00000 days
Start: 2024-05-15 — End: ?

## 2024-05-15 MED ADMIN — rifAXIMin (XIFAXAN) oral suspension: 550 mg | GASTROENTERAL | @ 13:00:00 | Stop: 2025-04-11

## 2024-05-15 MED ADMIN — rifAXIMin (XIFAXAN) oral suspension: 550 mg | GASTROENTERAL | @ 04:00:00 | Stop: 2025-04-11

## 2024-05-15 MED ADMIN — lactulose oral solution: 30 g | GASTROENTERAL | @ 13:00:00 | Stop: 2024-05-15

## 2024-05-15 MED ADMIN — folic acid (FOLVITE) tablet 1 mg: 1 mg | GASTROENTERAL | @ 13:00:00

## 2024-05-15 MED ADMIN — thiamine mononitrate (vit B1) tablet 100 mg: 100 mg | GASTROENTERAL | @ 13:00:00

## 2024-05-15 MED ADMIN — ergocalciferol (DRISDOL) oral drops 200 mcg/mL (8,000 unit/mL): 100 ug | GASTROENTERAL | @ 13:00:00 | Stop: 2024-05-21

## 2024-05-15 MED ADMIN — cyanocobalamin (vitamin B-12) tablet 1,000 mcg: 1000 ug | GASTROENTERAL | @ 13:00:00

## 2024-05-15 MED ADMIN — levETIRAcetam (KEPPRA) tablet 750 mg: 750 mg | GASTROENTERAL | @ 13:00:00

## 2024-05-15 MED ADMIN — levETIRAcetam (KEPPRA) tablet 750 mg: 750 mg | GASTROENTERAL | @ 04:00:00

## 2024-05-15 MED ADMIN — sodium chloride 3 % NEBULIZER solution 4 mL: 4 mL | RESPIRATORY_TRACT | @ 04:00:00 | Stop: 2024-05-15

## 2024-05-15 MED ADMIN — sodium chloride 3 % NEBULIZER solution 4 mL: 4 mL | RESPIRATORY_TRACT | @ 09:00:00 | Stop: 2024-05-15

## 2024-05-15 MED ADMIN — famotidine (PEPCID) tablet 20 mg: 20 mg | GASTROENTERAL | @ 13:00:00

## 2024-05-15 MED ADMIN — melatonin tablet 3 mg: 3 mg | GASTROENTERAL | @ 22:00:00

## 2024-05-15 MED ADMIN — apixaban (ELIQUIS) tablet 5 mg: 5 mg | GASTROENTERAL | @ 13:00:00

## 2024-05-15 MED ADMIN — apixaban (ELIQUIS) tablet 5 mg: 5 mg | GASTROENTERAL | @ 04:00:00

## 2024-05-15 MED ADMIN — multivitamins, therapeutic with minerals tablet 1 tablet: 1 | GASTROENTERAL | @ 13:00:00

## 2024-05-15 MED ADMIN — metoPROLOL tartrate (Lopressor) tablet 25 mg: 25 mg | GASTROENTERAL | @ 04:00:00

## 2024-05-15 MED ADMIN — metoPROLOL tartrate (Lopressor) tablet 25 mg: 25 mg | GASTROENTERAL | @ 13:00:00

## 2024-05-15 MED ADMIN — lacosamide (VIMPAT) tablet 100 mg: 100 mg | GASTROENTERAL | @ 04:00:00

## 2024-05-15 MED ADMIN — lacosamide (VIMPAT) tablet 100 mg: 100 mg | GASTROENTERAL | @ 13:00:00

## 2024-05-15 MED ADMIN — ipratropium-albuterol (DUO-NEB) 0.5-2.5 mg/3 mL nebulizer solution 3 mL: 3 mL | RESPIRATORY_TRACT | @ 16:00:00

## 2024-05-15 MED ADMIN — ipratropium-albuterol (DUO-NEB) 0.5-2.5 mg/3 mL nebulizer solution 3 mL: 3 mL | RESPIRATORY_TRACT | @ 02:00:00

## 2024-05-15 MED ADMIN — ipratropium-albuterol (DUO-NEB) 0.5-2.5 mg/3 mL nebulizer solution 3 mL: 3 mL | RESPIRATORY_TRACT | @ 09:00:00

## 2024-05-15 MED ADMIN — ipratropium-albuterol (DUO-NEB) 0.5-2.5 mg/3 mL nebulizer solution 3 mL: 3 mL | RESPIRATORY_TRACT | @ 21:00:00

## 2024-05-15 MED ADMIN — piperacillin-tazobactam (ZOSYN) IVPB (premix) 4.5 g: 4.5 g | INTRAVENOUS | @ 04:00:00 | Stop: 2024-05-15

## 2024-05-15 MED ADMIN — piperacillin-tazobactam (ZOSYN) IVPB (premix) 4.5 g: 4.5 g | INTRAVENOUS | @ 11:00:00 | Stop: 2024-05-15

## 2024-05-15 MED ADMIN — lactulose oral solution: 20 g | GASTROENTERAL | @ 18:00:00

## 2024-05-15 NOTE — Plan of Care (Signed)
 Shift Summary  Spontaneous breathing trial was completed early in the shift, and ventilator settings were adjusted to support respiratory function.   Airway suctioning and tracheostomy care were performed  Nutren 2.0 tube feedings and lactulose  were administered as ordered.   Overall, respiratory status and safety measures were maintained, with no falls or device complications noted.     Effective Breathing Pattern: Respiratory rate decreased steadily throughout the shift, and ventilator settings were adjusted to maintain a controlled breathing pattern; high and low respiratory rate alarms were triggered but resolved, and airway suctioning was performed as needed. Productive cough was noted early in the shift, and pulmonary hygiene was promoted.     Absence of Fall and Fall-Related Injury: Bed alarm remained activated and hourly visual checks confirmed in-bed status, with fall reduction interventions maintained and no transfers required. Safety devices and precautions were consistently in place.     Optimal Gas Exchange: SpO2 remained stable at 99% on ventilator support, FiO2 was briefly reduced then returned to baseline, and MAP was maintained within a narrow range. Ventilator mode and settings were adjusted as needed, and oxygen humidification was provided.     Mechanical Ventilation Liberation: Spontaneous breathing trial was performed early in the shift, and ventilator mode was changed between PSV-CPAP and PRVC; tidal volumes and respiratory rates were monitored and adjusted. No trach tube change was required, and trach remained patent and secure.

## 2024-05-15 NOTE — Plan of Care (Signed)
 PT switched  from [facility] to PS at 1730; PT had been breathing above the rate before mode  switch;   Trach care and airway clearance  completed;  No site breakdown; Mepilex placed under the trach flange;  Lower airway  SCX revealed moderate  yellow thick secretions;  [facility] and Emergency  Trach supplies at [facility]     Problem: Breathing Pattern Ineffective  Goal: Effective Breathing Pattern  Outcome: Progressing  Intervention: Promote Improved Breathing Pattern  Recent Flowsheet Documentation  Taken 05/15/2024 1609 by Gretel Carlin BROCKS, RRT  Head of Bed [facility]) Positioning: HOB at 30-45 degrees  Taken 05/15/2024 0815 by Gretel Carlin BROCKS, RRT  Head of Bed [facility]) Positioning: HOB at 30-45 degrees     Problem: Gas Exchange Impaired  Goal: Optimal Gas Exchange  Outcome: Progressing  Intervention: Optimize Oxygenation and Ventilation  Recent Flowsheet Documentation  Taken 05/15/2024 1609 by Gretel Carlin BROCKS, RRT  Head of Bed [facility]) Positioning: HOB at 30-45 degrees  Taken 05/15/2024 0815 by Gretel Carlin BROCKS, RRT  Head of Bed [facility]) Positioning: HOB at 30-45 degrees     Problem: Mechanical Ventilation Invasive  Goal: Optimal Device Function  Intervention: Optimize Device Care and Function  Recent Flowsheet Documentation  Taken 05/15/2024 0815 by Gretel Carlin BROCKS, RRT  Oral Care:   mouth swabbed   teeth brushed   tongue brushed  Goal: Absence of Ventilator-Induced Lung Injury  Intervention: Prevent Ventilator-Associated Pneumonia  Recent Flowsheet Documentation  Taken 05/15/2024 1609 by Gretel Carlin BROCKS, RRT  Head of Bed [facility]) Positioning: HOB at 30-45 degrees  Taken 05/15/2024 0815 by Gretel Carlin BROCKS, RRT  Head of Bed [facility]) Positioning: HOB at 30-45 degrees  Oral Care:   mouth swabbed   teeth brushed   tongue brushed

## 2024-05-15 NOTE — Plan of Care (Signed)
 Problem: Mechanical Ventilation Invasive  Goal: Mechanical Ventilation Liberation  Outcome: Ongoing - Unchanged     Problem: Artificial Airway  Goal: Optimal Device Function  Outcome: Ongoing - Unchanged  Note: Trach care completed. The pt remains on PRVC 15/350/+8/30% tolerating well. Pt noted to have copious thick yellow/tan secretions requiring the pt to be lavaged multiple times throughout the shift. Attempted PSV 14/+5 30% but the pt did not tolerate due to being tachypneic (RR >35), HR>100, and VT's < 41mL/kg. SBT tolerated for about 5 mins. All treatments given as ordered. VSS and NAD noted at this time. Will continue to monitor.   Intervention: Optimize Device Care and Function  Flowsheets  Taken 05/15/2024 9364  Aspiration Precautions:   respiratory status monitored   upright posture maintained  Oral Care: suction provided  Airway Safety Measures:   manual resuscitator/mask at bedside   oxygen flowmeter at bedside   suction at bedside  Taken 05/15/2024 0620  Airway/Ventilation Management:   airway patency maintained   humidification applied   pulmonary hygiene promoted  Oral Care: suction provided

## 2024-05-15 NOTE — Progress Notes (Signed)
 Internal Medicine (MEDW) Progress Note    Assessment & Plan:   [name] is a 78 y.o. female who is presenting to [facility] with Bacteremia, escherichia coli, in the setting of the following pertinent/contributing co-morbidities: HTN, MetALD cirrhosis, severe obseity, and persistent a fib.    Principal Problem:    Bacteremia, escherichia coli  Active Problems:    Alcoholic liver disease (HHS-HCC)    HTN (hypertension)    Depression with anxiety    Class 2 severe obesity with serious comorbidity in adult    Atrial fibrillation    (CMS-HCC)    Pleural effusion    Hypernatremia    Thrombocytopenia    Cirrhosis    (CMS-HCC)    A-fib (CMS-HCC)    Hepatic encephalopathy    (CMS-HCC)    Anaphylaxis      Brief hospital course:  Patient presented to [facility] initially for encephalopathy following an aborted DCCV (LAA thrombus found). She subsequently had a complex hospital stay with multiple ICU admissions, PEA arrest, multiple intubations and eventually tracheostomy. Currently weaning trach, and providing NGT feeds, but mental status continues to be poor. She was admitted to the MICU May 25, 2024 for hypotension requiring norepinephrine , suspect septic shock 2/2 PsA PNA vs RLE cellulitis.    **Per daughter, she does yet not want to have her mother told that her husband passed away in hospice on 05-25-2024**    Active Problems    GOC   Multiple prior ACP discussions. Appreciate palliative care input. Goal is for functional recovery, unfortunately mental status appears to be very waxing/waning and per dw neuro, prognosis is uncertain and will take a long time to recover if she ever does. Family meeting held 12/4 over the phone, provided updates, daughter still wants to give her some time and would like to see her in person, unfortunately currently has a stomach bug  - Palliative care following, appreciate assistance in continuing to have ongoing GOC conversations    Encephalopathy, multifactorial - Focal non-convulsive seizures  Multiple drivers, prolonged ICU stays, cardiac arrest, metabolic derangements have been corrected, likely some hepatic encephalopathy. Did have subclinical seizures seen on EEG earlier in her course as well. Avoiding centrally acting medications. Intermittently opening eyes to verbal and tactile stimuli.  Of note, has become more encephalopathic in the past if she does not have adequate bowel movements. Mental status is waxing and waning, is less alert today on exam. Decreasing lactulose  today given consistent high FMS output and persistent hypernatremia.   - Decrease Lactulose  to 20mg ; only hold if >1024ml stool via FMS  - Continue Rifaximin  550 mg twice daily  - Ongoing re-evalaution especially with family at bedside as able  - Discussed with neuro, while MRI Brain 12/3 improved, and could have been 2/2 prolonged subclinical seizures, or PRES, however given lack of improvement in exam findings, toxic metabolic insults also likely contributed, and it is difficult to predict how much and within what time frame she will improve  - Continue Levetiracetam  750mg  BID and Lacosamide  100mg  BID  - Continue delirium precautions     Chronic hypoxemic and hypercarbic respiratory failure - Trach/vent depedent - Apneic events  Initially suspected to be due to encephalopathy as above.  Multiple chest x-rays have shown pleural effusions.  Intubated 10/24-11/14. Failed extubation attempts and tracheostomy placed on 11/14.  Difficulty weaning pressure support and has required very slow decreases as she has become hypercarbic with faster weans. Mental status/encephalopathy appears to be driving vent dependence, although neuro meds do  not appear to be contributing due to timing of doses and timing of SBTs. Have not observed episodes of apnea as previously noted, consider engaging [facility] team for further recs for vent weaning. Holding further diuresis today given ongoing hypernatremia.   - Continue trials of PSV/CPAP during day  - Routine trach care per RT  - VBG qAM  - Duonebs, Hypertonic saline    Anasarca  Likely 2/2 prolonged critical illness, underlying cirrhosis, and hypoalbuminemia. Marked pitting edema in bilateral lower extremities ascending to thighs. Diuresis previously held in setting of hypotension, now restarting as blood pressures have improved. Holding today iso hypernatremia.   - Consider resuming lasix  tomorrow    C/f RLE cellulitis  CT RLE 12/9 with RLE cellulitis without underlying abscess or deep tracking soft tissue gas. S/p vanc. doxycycline  12/7-12/11 (completed course). Last fever 12/9. Skin swab culture positive for <1+ Pseudomonas. Exam with erythema of bilateral thighs, favor edema iso prolonged illness and volume resuscitation - not overtly infectious-appearing on exam. Patient also remains afebrile and clinically stable, will defer further antibiotics unless clinically worsens. BLE PVL 12/4 without signs of DVT.   - Continue to monitor    Ventilator associated pneumonia  CXR 12/8 with pleural effusions and possible consolidation that could represent pneumonia versus aspiration.  Likely secondary to Pseudomonas (as seen on lower respiratory culture) and MSSA. She continues to have increased thick secretions. Last fever 12/9.  - Zosyn  (12/7- )  - [facility] 12/7 (Tracheal aspirate) with 3+ Pseudomonas, 1+ staph aureus  - Trach care per RT    Enteral feeds  NGT in place.  - Nutrition following  - Continue B12, thiamine , folate. MVN    The patient's presentation is complicated by the following clinically significant conditions requiring additional evaluation and treatment: - Hypercoagulable state requiring additional attention to DVT prophylaxis and treatment or chronic anticoagulation secondary to Atrial Fibrillation   - Altered mental status secondary to - Metabolic Encephalopathy POA requiring further investigation or monitoring and - Delirium POA requiring further investigation or monitoring  - Age related debility POA requiring additional resources: DME, PT, or OT  - Hypoxia requiring further investigation, treatment, or monitoring  - Anemia requiring at least daily CBC for further monitoring    Chronic Problems    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 Stable ~8.  - Daily CBC    Persistent AFib - LAA thrombus  Continues to be in Afib. Rate controlled with medication.  - Metop 25mg  BID  - Apixaban  5mg  BID     History of prolonged Qtc  - Regular EKGs  - Most recent EKG 12/5 with QTc Frederica 405    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)   -  Holding home anastrozole     Daily Checklist:  Diet: Tube Feeds  DVT PPx: Patient Already on Full Anticoagulation with eliquis   Electrolytes: Replete Potassium to >/= 3.6 and Magnesium  to >/= 1.8  Code Status: Full Code  Dispo: Transfer to MPCU    Team Contact Information:   Primary Team: Internal Medicine (MEDW)  Primary Resident: Zelda Moose,  MD, MD  Resident's Pager: [phone] (Gen MedW Intern - [place])    Interval History:   No acute events overnight.    Patient somnolent on exam, not following commands, not opening eyes to voice.     Objective:   Temp:  [36.9 ??C (98.5 ??F)-37.8 ??C (100 ??F)] 37.1 ??C (98.7 ??F)  Pulse:  [86-122] 97  SpO2 Pulse:  [84-114] 104  Resp:  [14-23] 19  BP: (120-157)/(47-94) 156/73  FiO2 (%):  [30 %] 30 %  SpO2:  [94 %-100 %] 100 %,   Intake/Output Summary (Last 24 hours) at 05/15/2024 0818  Last data filed at 05/15/2024 0400  Gross per 24 hour   Intake 2030 ml   Output 2225 ml   Net -195 ml     Gen: Lying in bed, not responsive to voice  HEENT: Dry MM, NGT in place with feeds running, trach in place  CV: Regular rate, irregular rhythm, no MRG  Pulm: Rhonchorous breath sounds bilaterally   Abd: Soft, non-tender, normal BS. FMS in place  GU: External catheter in place  Ext: Diffuse edema of bilateral extremities ascending to thighs. LUE PICC in place  Skin: Bilateral thighs erythematous

## 2024-05-15 NOTE — Plan of Care (Deleted)
 Shift Summary  Airway suctioning and tracheostomy care were performed  Nutren 2.0 tube feedings and lactulose  were administered as ordered.   Overall, respiratory status and safety measures were maintained, with no falls or device complications noted.     Effective Breathing Pattern: Respiratory rate decreased steadily throughout the shift, and ventilator settings were adjusted to maintain a controlled breathing pattern; high and low respiratory rate alarms were triggered but resolved, and airway suctioning was performed as needed. Productive cough was noted early in the shift, and pulmonary hygiene was promoted.     Absence of Fall and Fall-Related Injury: Bed alarm remained activated and hourly visual checks confirmed in-bed status, with fall reduction interventions maintained and no transfers required. Safety devices and precautions were consistently in place.     Optimal Gas Exchange: SpO2 remained stable at 99% on ventilator support, FiO2 was briefly reduced then returned to baseline, and MAP was maintained within a narrow range. Ventilator mode and settings were adjusted as needed, and oxygen humidification was provided.     Mechanical Ventilation Liberation: Spontaneous breathing trial was performed early in the shift, and ventilator mode was changed between PSV-CPAP and PRVC; tidal volumes and respiratory rates were monitored and adjusted. No trach tube change was required, and trach remained patent and secure.

## 2024-05-15 NOTE — Plan of Care (Signed)
 Shift Summary  Trach care was performed twice during the shift, with site kept clean and secure, and ventilator mode was adjusted as needed.   Fall prevention measures, including bed alarms and frequent toileting, were consistently implemented with no reported falls.   Oxygenation and airway management were maintained with humidified oxygen, regular pulmonary hygiene, and VAP prevention bundle interventions.   Restraints were monitored and less restrictive alternatives were regularly assessed, with no restraint-related incidents.   Overall, the patient remained confused but awake, with stable vital signs and no new injuries or significant changes in condition during the shift.

## 2024-05-16 LAB — CBC W/ AUTO DIFF
BASOPHILS ABSOLUTE COUNT: 0.1 10*9/L (ref 0.0–0.1)
BASOPHILS RELATIVE PERCENT: 1.2 %
EOSINOPHILS ABSOLUTE COUNT: 0.4 10*9/L (ref 0.0–0.5)
EOSINOPHILS RELATIVE PERCENT: 5.4 %
HEMATOCRIT: 23.1 % — ABNORMAL LOW (ref 34.0–44.0)
HEMOGLOBIN: 7.5 g/dL — ABNORMAL LOW (ref 11.3–14.9)
LYMPHOCYTES ABSOLUTE COUNT: 1.2 10*9/L (ref 1.1–3.6)
LYMPHOCYTES RELATIVE PERCENT: 17.4 %
MEAN CORPUSCULAR HEMOGLOBIN CONC: 32.5 g/dL (ref 32.0–36.0)
MEAN CORPUSCULAR HEMOGLOBIN: 29.9 pg (ref 25.9–32.4)
MEAN CORPUSCULAR VOLUME: 91.8 fL (ref 77.6–95.7)
MEAN PLATELET VOLUME: 8.5 fL (ref 6.8–10.7)
MONOCYTES ABSOLUTE COUNT: 0.6 10*9/L (ref 0.3–0.8)
MONOCYTES RELATIVE PERCENT: 8.6 %
NEUTROPHILS ABSOLUTE COUNT: 4.6 10*9/L (ref 1.8–7.8)
NEUTROPHILS RELATIVE PERCENT: 67.4 %
PLATELET COUNT: 190 10*9/L (ref 150–450)
RED BLOOD CELL COUNT: 2.51 10*12/L — ABNORMAL LOW (ref 3.95–5.13)
RED CELL DISTRIBUTION WIDTH: 18.5 % — ABNORMAL HIGH (ref 12.2–15.2)
WBC ADJUSTED: 6.9 10*9/L (ref 3.6–11.2)

## 2024-05-16 LAB — BLOOD GAS, VENOUS
BASE EXCESS VENOUS: 4.7 — ABNORMAL HIGH (ref -2.0–2.0)
CARBOXYHEMOGLOBIN, VENOUS: 1.5 % — ABNORMAL HIGH (ref <1.2)
HCO3 VENOUS: 29 mmol/L — ABNORMAL HIGH (ref 22–27)
METHEMOGLOBIN, VENOUS: <1 % (ref <1.5)
O2 SATURATION VENOUS: 57.7 % (ref 40.0–85.0)
OXYHEMOGLOBIN, VENOUS: 56.6 % (ref 40.0–85.0)
PCO2 VENOUS: 46 mmHg (ref 40–60)
PH VENOUS: 7.42 (ref 7.32–7.43)
PO2 VENOUS: 36 mmHg (ref 30–55)

## 2024-05-16 LAB — BASIC METABOLIC PANEL
ANION GAP: 11 mmol/L (ref 5–14)
ANION GAP: 4 mmol/L — ABNORMAL LOW (ref 5–14)
BLOOD UREA NITROGEN: 12 mg/dL (ref 9–23)
BLOOD UREA NITROGEN: 16 mg/dL (ref 9–23)
BUN / CREAT RATIO: 31
BUN / CREAT RATIO: 41
CALCIUM: 8 mg/dL — ABNORMAL LOW (ref 8.7–10.4)
CALCIUM: 8 mg/dL — ABNORMAL LOW (ref 8.7–10.4)
CHLORIDE: 105 mmol/L (ref 98–107)
CHLORIDE: 109 mmol/L — ABNORMAL HIGH (ref 98–107)
CO2: 29 mmol/L (ref 20.0–31.0)
CO2: 29 mmol/L (ref 20.0–31.0)
CREATININE: 0.39 mg/dL — ABNORMAL LOW (ref 0.55–1.02)
CREATININE: 0.39 mg/dL — ABNORMAL LOW (ref 0.55–1.02)
EGFR CKD-EPI (2021) FEMALE: >90 mL/min/1.73m2 (ref >=60)
EGFR CKD-EPI (2021) FEMALE: >90 mL/min/1.73m2 (ref >=60)
GLUCOSE RANDOM: 130 mg/dL (ref 70–179)
GLUCOSE RANDOM: 165 mg/dL (ref 70–179)
POTASSIUM: 3.2 mmol/L — ABNORMAL LOW (ref 3.4–4.8)
POTASSIUM: 3.6 mmol/L (ref 3.4–4.8)
SODIUM: 145 mmol/L (ref 135–145)
SODIUM: 142 mmol/L (ref 135–145)

## 2024-05-16 LAB — MAGNESIUM
MAGNESIUM: 1.9 mg/dL (ref 1.6–2.6)
MAGNESIUM: 2.3 mg/dL (ref 1.6–2.6)

## 2024-05-16 LAB — HEPATIC FUNCTION PANEL
ALBUMIN: 1.9 g/dL — ABNORMAL LOW (ref 3.4–5.0)
ALKALINE PHOSPHATASE: 197 U/L — ABNORMAL HIGH (ref 46–116)
ALT (SGPT): 8 U/L — ABNORMAL LOW (ref 10–49)
AST (SGOT): 37 U/L — ABNORMAL HIGH (ref <=34)
BILIRUBIN DIRECT: 0.3 mg/dL (ref 0.00–0.30)
BILIRUBIN TOTAL: 0.6 mg/dL (ref 0.3–1.2)
PROTEIN TOTAL: 5.5 g/dL — ABNORMAL LOW (ref 5.7–8.2)

## 2024-05-16 LAB — ZINC: ZINC: 40 ug/dL — ABNORMAL LOW

## 2024-05-16 LAB — PHOSPHORUS: PHOSPHORUS: 2.6 mg/dL (ref 2.4–5.1)

## 2024-05-16 LAB — SODIUM, URINE, RANDOM: SODIUM URINE: 16 mmol/L

## 2024-05-16 MED ORDER — RIFAXIMIN 550 MG TABLET
ORAL_TABLET | Freq: Two times a day (BID) | ORAL | 0 refills | 30.00000 days | Status: CP
Start: 2024-05-16 — End: 2024-05-16

## 2024-05-16 MED ADMIN — rifAXIMin (XIFAXAN) oral suspension: 550 mg | GASTROENTERAL | @ 02:00:00 | Stop: 2025-04-11

## 2024-05-16 MED ADMIN — rifAXIMin (XIFAXAN) oral suspension: 550 mg | GASTROENTERAL | @ 13:00:00 | Stop: 2025-04-11

## 2024-05-16 MED ADMIN — folic acid (FOLVITE) tablet 1 mg: 1 mg | GASTROENTERAL | @ 13:00:00

## 2024-05-16 MED ADMIN — thiamine mononitrate (vit B1) tablet 100 mg: 100 mg | GASTROENTERAL | @ 13:00:00

## 2024-05-16 MED ADMIN — ergocalciferol (DRISDOL) oral drops 200 mcg/mL (8,000 unit/mL): 100 ug | GASTROENTERAL | @ 13:00:00 | Stop: 2024-05-21

## 2024-05-16 MED ADMIN — cyanocobalamin (vitamin B-12) tablet 1,000 mcg: 1000 ug | GASTROENTERAL | @ 13:00:00

## 2024-05-16 MED ADMIN — levETIRAcetam (KEPPRA) tablet 750 mg: 750 mg | GASTROENTERAL | @ 13:00:00

## 2024-05-16 MED ADMIN — levETIRAcetam (KEPPRA) tablet 750 mg: 750 mg | GASTROENTERAL | @ 02:00:00

## 2024-05-16 MED ADMIN — famotidine (PEPCID) tablet 20 mg: 20 mg | GASTROENTERAL | @ 13:00:00

## 2024-05-16 MED ADMIN — melatonin tablet 3 mg: 3 mg | GASTROENTERAL | @ 23:00:00

## 2024-05-16 MED ADMIN — apixaban (ELIQUIS) tablet 5 mg: 5 mg | GASTROENTERAL | @ 02:00:00

## 2024-05-16 MED ADMIN — apixaban (ELIQUIS) tablet 5 mg: 5 mg | GASTROENTERAL | @ 13:00:00

## 2024-05-16 MED ADMIN — multivitamins, therapeutic with minerals tablet 1 tablet: 1 | GASTROENTERAL | @ 13:00:00

## 2024-05-16 MED ADMIN — metoPROLOL tartrate (Lopressor) tablet 25 mg: 25 mg | GASTROENTERAL | @ 13:00:00 | Stop: 2024-05-16

## 2024-05-16 MED ADMIN — metoPROLOL tartrate (Lopressor) tablet 25 mg: 25 mg | GASTROENTERAL | @ 02:00:00

## 2024-05-16 MED ADMIN — lacosamide (VIMPAT) tablet 100 mg: 100 mg | GASTROENTERAL | @ 02:00:00

## 2024-05-16 MED ADMIN — lacosamide (VIMPAT) tablet 100 mg: 100 mg | GASTROENTERAL | @ 13:00:00

## 2024-05-16 MED ADMIN — ipratropium-albuterol (DUO-NEB) 0.5-2.5 mg/3 mL nebulizer solution 3 mL: 3 mL | RESPIRATORY_TRACT | @ 09:00:00

## 2024-05-16 MED ADMIN — ipratropium-albuterol (DUO-NEB) 0.5-2.5 mg/3 mL nebulizer solution 3 mL: 3 mL | RESPIRATORY_TRACT | @ 21:00:00

## 2024-05-16 MED ADMIN — ipratropium-albuterol (DUO-NEB) 0.5-2.5 mg/3 mL nebulizer solution 3 mL: 3 mL | RESPIRATORY_TRACT | @ 15:00:00

## 2024-05-16 MED ADMIN — ipratropium-albuterol (DUO-NEB) 0.5-2.5 mg/3 mL nebulizer solution 3 mL: 3 mL | RESPIRATORY_TRACT | @ 02:00:00

## 2024-05-16 MED ADMIN — metoPROLOL tartrate (Lopressor) tablet 12.5 mg: 12.5 mg | GASTROENTERAL | @ 23:00:00

## 2024-05-16 MED ADMIN — metoPROLOL tartrate (Lopressor) tablet 12.5 mg: 12.5 mg | GASTROENTERAL | @ 18:00:00

## 2024-05-16 MED ADMIN — lactulose oral solution: 20 g | GASTROENTERAL | @ 13:00:00

## 2024-05-16 MED ADMIN — lactulose oral solution: 20 g | GASTROENTERAL | @ 18:00:00

## 2024-05-16 MED ADMIN — lactulose oral solution: 20 g | GASTROENTERAL | @ 02:00:00

## 2024-05-16 MED ADMIN — acetaminophen (TYLENOL) tablet 650 mg: 650 mg | GASTROENTERAL | @ 13:00:00

## 2024-05-16 MED ADMIN — potassium chloride (KLOR-CON) packet 40 mEq: 40 meq | GASTROENTERAL | @ 13:00:00 | Stop: 2024-05-16

## 2024-05-16 MED ADMIN — furosemide (LASIX) injection 40 mg: 40 mg | INTRAVENOUS | @ 16:00:00 | Stop: 2024-05-16

## 2024-05-16 MED ADMIN — magnesium sulfate 2gm/50mL IVPB: 2 g | INTRAVENOUS | @ 16:00:00 | Stop: 2024-05-16

## 2024-05-16 NOTE — Plan of Care (Addendum)
 Pt. is nonverbal UTA orientation status, intermittently alert to drowsy. Mitts maintained for safety of pulling at lines/tubes. HR afib 130's-140's this early afternoon, MD notified new start scheduled metoprolol  given as ordered with improvement in HR. O2 sats >90% on 28% vent (on a rate) pt unable to tolerate pressure support with RT today. Afebrile. No C/O pain. UO adequate, adequate output from FMS today. TF running at goal. See wound doc for skin alterations. Q2H turns and standard precautions maintained. Family at bedside. All monitors with appopriate alarm settings.      Problem: Skin Injury Risk Increased  Goal: Skin Health and Integrity  Outcome: Shift Focus  Intervention: Optimize Skin Protection  Recent Flowsheet Documentation  Taken 05/16/2024 0800 by Nelta Harlene LABOR, RN  Head of Bed [name]) Positioning: HOB at 30-45 degrees     Problem: Adult Inpatient Plan of Care  Goal: Absence of Hospital-Acquired Illness or Injury  Intervention: Identify and Manage Fall Risk  Recent Flowsheet Documentation  Taken 05/16/2024 0800 by Nelta Harlene LABOR, RN  Safety Interventions:   aspiration precautions   fall reduction program maintained   environmental modification   lighting adjusted for tasks/safety   low bed   infection management   latex precautions   isolation precautions  Intervention: Prevent Infection  Recent Flowsheet Documentation  Taken 05/16/2024 0800 by Nelta Harlene LABOR, RN  Infection Prevention:   cohorting utilized   environmental surveillance performed  Goal: Optimal Comfort and Wellbeing  Outcome: Shift Focus     Problem: Adult Inpatient Plan of Care  Goal: Optimal Comfort and Wellbeing  Outcome: Shift Focus     Problem: Adult Inpatient Plan of Care  Goal: Absence of Hospital-Acquired Illness or Injury  Intervention: Prevent Infection  Recent Flowsheet Documentation  Taken 05/16/2024 0800 by Nelta Harlene LABOR, RN  Infection Prevention:   cohorting utilized   environmental surveillance performed

## 2024-05-16 NOTE — Progress Notes (Signed)
 Internal Medicine (MEDW) Progress Note    Assessment & Plan:   [name] is a 78 y.o. female who is presenting to [facility] with Bacteremia, escherichia coli, in the setting of the following pertinent/contributing co-morbidities: HTN, MetALD cirrhosis, severe obseity, and persistent a fib.    Principal Problem:    Bacteremia, escherichia coli  Active Problems:    Alcoholic liver disease (HHS-HCC)    HTN (hypertension)    Depression with anxiety    Class 2 severe obesity with serious comorbidity in adult    Atrial fibrillation    (CMS-HCC)    Pleural effusion    Hypernatremia    Thrombocytopenia    Cirrhosis    (CMS-HCC)    A-fib (CMS-HCC)    Hepatic encephalopathy    (CMS-HCC)    Anaphylaxis      Brief hospital course:  Patient presented to [facility] initially for encephalopathy following an aborted DCCV (LAA thrombus found). She subsequently had a complex hospital stay with multiple ICU admissions, PEA arrest, multiple intubations and eventually tracheostomy. Currently weaning trach, and providing NGT feeds, but mental status continues to be poor. She was admitted to the MICU 05-20-24 for hypotension requiring norepinephrine , suspect septic shock 2/2 PsA PNA vs RLE cellulitis.    **Per daughter, she does yet not want to have her mother told that her husband passed away in hospice on 05/20/2024**    Active Problems    GOC   Multiple prior ACP discussions. Appreciate palliative care input. Goal is for functional recovery, unfortunately mental status appears to be very waxing/waning and per dw neuro, prognosis is uncertain and will take a long time to recover if she ever does. Family meeting held 12/4 over the phone, provided updates, daughter still wants to give her some time and would like to see her in person, unfortunately currently has a stomach bug  - Palliative care following, appreciate assistance in continuing to have ongoing GOC conversations    Encephalopathy, multifactorial - Focal non-convulsive seizures  Multiple drivers, prolonged ICU stays, cardiac arrest, metabolic derangements have been corrected, likely some hepatic encephalopathy. Did have subclinical seizures seen on EEG earlier in her course as well. Avoiding centrally acting medications. Intermittently opening eyes to verbal and tactile stimuli.  Of note, has become more encephalopathic in the past if she does not have adequate bowel movements. Mental status is waxing and waning, although was more alert today. Decreased lactulose  given consistent high FMS output and persistent hypernatremia.   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  Multiple chest x-rays have shown pleural effusions.  Intubated 10/24-11/14. Failed extubation attempts and tracheostomy placed on 11/14.  Difficulty weaning pressure support and has required very slow decreases as she has become hypercarbic with faster weans. Have not observed episodes of apnea as previously noted since transfer out of MICU.  Discussed with WIND team today, and  witnessed increasing tachypnea while on PSV with relatively alert mental status; favor difficulty with trach wean is more intrinsic to respiratory status rather than encephalopathy.   - Volera BID  - Routine trach care per RT  - VBG qAM  - Duonebs, Hypertonic saline    Anasarca  Likely 2/2 prolonged critical illness, underlying cirrhosis, and hypoalbuminemia. Marked pitting edema in bilateral lower extremities ascending to thighs. Diuresis previously held in setting of hypotension, now restarting as blood pressures have improved.  - IV lasix  40mg     C/f RLE cellulitis  CT RLE 12/9 with RLE cellulitis without underlying abscess or deep tracking soft tissue gas. S/p vanc. doxycycline  12/7-12/11 (completed course). Last fever 12/9. Skin swab culture positive for <1+ Pseudomonas. Exam with erythema of bilateral thighs, favor edema iso prolonged illness and volume resuscitation - not overtly infectious-appearing on exam. Patient also remains afebrile and clinically stable, will defer further antibiotics unless clinically worsens. BLE PVL 12/4 without signs of DVT.   - Continue to monitor    Ventilator associated pneumonia  CXR 12/8 with pleural effusions and possible consolidation that could represent pneumonia versus aspiration.  Likely secondary to Pseudomonas (as seen on lower respiratory culture) and MSSA. She continues to have increased thick secretions, but has reassuringly remained afebrile without increased ventilation needs.   - s/p Zosyn   - [facility] 12/7 (Tracheal aspirate) with 3+ Pseudomonas, 1+ staph aureus  - Trach care per RT    Enteral feeds  NGT in place.  - Nutrition following  - Continue B12, thiamine , folate. MVN    The patient's presentation is complicated by the following clinically significant conditions requiring additional evaluation and treatment: - Hypercoagulable state requiring additional attention to DVT prophylaxis and treatment or chronic anticoagulation secondary to Atrial Fibrillation   - Altered mental status secondary to - Metabolic Encephalopathy POA requiring further investigation or monitoring and - Delirium POA requiring further investigation or monitoring  - Age related debility POA requiring additional resources: DME, PT, or OT  - Hypoxia requiring further investigation, treatment, or monitoring  - Anemia requiring at least daily CBC for further monitoring    Chronic Problems    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 Stable ~8.  - Daily CBC    Persistent AFib - LAA thrombus  Continues to be in Afib. Rate controlled with medication. Episode of Afib with RVR today with unclear trigger, increased scheduled metoprolol  for rate control.   - Increased Metop tartrate 12.5 mg q6h   - Apixaban  5mg  BID     History of prolonged Qtc  - Regular EKGs  - Most recent EKG 12/5 with QTc Frederica 405    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)   -  Holding home anastrozole     Daily Checklist:  Diet: Tube Feeds  DVT PPx: Patient Already on Full Anticoagulation with eliquis   Electrolytes: Replete Potassium to >/= 3.6 and Magnesium  to >/= 1.8  Code Status: Full Code  Dispo: Transfer to [facility]    Team Contact Information:   Primary Team:  Internal Medicine (MEDW)  Primary Resident: Zelda Moose, MD, MD  Resident's Pager: [phone] (Gen MedW Intern - [place])    Interval History:   No acute events overnight.    Patient initially somnolent; on later exam, she is awake and tracking with eyes. Called daughter with updates, she expressed desire for her mother to go to [facility] in [place].     Objective:   Temp:  [37.1 ??C (98.7 ??F)-37.7 ??C (99.9 ??F)] 37.7 ??C (99.9 ??F)  Pulse:  [82-110] 103  SpO2 Pulse:  [78-103] 100  Resp:  [19-29] 24  BP: (111-166)/(46-85) 137/85  FiO2 (%):  [28 %-30 %] 28 %  SpO2:  [96 %-100 %] 96 %,   Intake/Output Summary (Last 24 hours) at 05/16/2024 0752  Last data filed at 05/16/2024 0400  Gross per 24 hour   Intake 3490 ml   Output 1800 ml   Net 1690 ml     Gen: Lying in bed, not responsive to voice  HEENT: Dry MM, NGT in place with feeds running, trach in place  CV: Regular rate, irregular rhythm, no MRG  Pulm: Rhonchorous breath sounds bilaterally   Abd: Soft, non-tender, normal BS. FMS in place  GU: External catheter in place  Ext: Diffuse edema of bilateral extremities ascending to thighs. LUE PICC in place  Skin: Bilateral thighs erythematous

## 2024-05-16 NOTE — Plan of Care (Addendum)
 Trach care and airway clearance completed; No site breakdown; Mepilex placed under the trach flange; Lower airway SCX revealed moderate to large thick  tan secretions;  Neb TX admin;  MD care team ordered [facility] airway clearance;  Ambu Bag and Emergency Trach supplies at the [facility]      Problem: Gas Exchange Impaired  Goal: Optimal Gas Exchange  Outcome: Ongoing - Unchanged  Intervention: Optimize Oxygenation and Ventilation  Recent Flowsheet Documentation  Taken 05/16/2024 1405 by Gretel Carlin BROCKS, RRT  Head of Bed [facility]) Positioning: HOB at 30-45 degrees  Taken 05/16/2024 1000 by Gretel Carlin BROCKS, RRT  Head of Bed [facility]) Positioning: HOB at 30-45 degrees     Problem: Mechanical Ventilation Invasive  Goal: Optimal Device Function  Intervention: Optimize Device Care and Function  Recent Flowsheet Documentation  Taken 05/16/2024 1000 by Gretel Carlin BROCKS, RRT  Oral Care:   mouth swabbed   teeth brushed   tongue brushed  Goal: Absence of Ventilator-Induced Lung Injury  Intervention: Prevent Ventilator-Associated Pneumonia  Recent Flowsheet Documentation  Taken 05/16/2024 1405 by Gretel Carlin BROCKS, RRT  Head of Bed [facility]) Positioning: HOB at 30-45 degrees  Taken 05/16/2024 1000 by Gretel Carlin BROCKS, RRT  Head of Bed [facility]) Positioning: HOB at 30-45 degrees  Oral Care:   mouth swabbed   teeth brushed   tongue brushed     Problem: Artificial Airway  Goal: Effective Communication  Outcome: Ongoing - Unchanged  Goal: Optimal Device Function  Outcome: Ongoing - Unchanged  Intervention: Optimize Device Care and Function  Recent Flowsheet Documentation  Taken 05/16/2024 1000 by Gretel Carlin BROCKS, RRT  Oral Care:   mouth swabbed   teeth brushed   tongue brushed  Goal: Absence of Device-Related Skin or Tissue Injury  Outcome: Ongoing - Unchanged   rach supplies at [facility]

## 2024-05-16 NOTE — Plan of Care (Signed)
 Shift Summary  Oxygen saturation remained high and airway interventions were performed regularly, including humidified oxygen and suctioning.   Mean arterial pressure increased over the course of the shift, with values rising from 73 to 103 mmHg.   Temperature was stable and infection prevention practices were maintained, including hand hygiene and aseptic technique.   Skin integrity was preserved at device and restraint sites, with no injury noted and catheter sites remaining clean and intact.   Cardiac rhythm remained atrial fibrillation throughout the shift, with monitoring in place and no cardiac symptoms documented; overall, interventions were maintained and no acute changes occurred.    Optimal Gas Exchange: Oxygen saturation remained consistently high throughout the shift, airway patency was maintained, and pulmonary hygiene interventions were performed regularly, including humidified oxygen and airway suctioning.    Blood Pressure in Desired Range: Mean arterial pressure increased steadily over the shift, reaching a higher value by the end of the shift.    Absence of Infection Signs and Symptoms: Temperature remained stable and aseptic technique was maintained, with infection prevention measures such as hand hygiene and equipment disinfection performed; no new symptoms documented.    Absence of Device-Related Skin or Tissue Injury: Skin assessments showed no signs of injury at restraint sites, and all tubing/devices were kept free from skin contact; site assessments for the urinary catheter remained clean and intact throughout the shift.    Normalized Cardiac Rhythm: Atrial fibrillation persisted throughout the shift, with cardiac monitoring in place and no cardiac symptoms documented.

## 2024-05-16 NOTE — Plan of Care (Signed)
 Problem: Skin Injury Risk Increased  Goal: Skin Health and Integrity  Intervention: Optimize Skin Protection  Recent Flowsheet Documentation  Taken 05/15/2024 1950 by Pearson Bolk, RRT  Head of Bed [facility]) Positioning: HOB at 30-45 degrees     Problem: Breathing Pattern Ineffective  Goal: Effective Breathing Pattern  Intervention: Promote Improved Breathing Pattern  Recent Flowsheet Documentation  Taken 05/16/2024 0201 by Pearson Bolk, RRT  Airway/Ventilation Management:   airway patency maintained   humidification applied   pulmonary hygiene promoted  Taken 05/15/2024 1950 by Pearson Bolk, RRT  Head of Bed [facility]) Positioning: HOB at 30-45 degrees  Airway/Ventilation Management:   airway patency maintained   humidification applied   pulmonary hygiene promoted     Problem: Gas Exchange Impaired  Goal: Optimal Gas Exchange  Intervention: Optimize Oxygenation and Ventilation  Recent Flowsheet Documentation  Taken 05/16/2024 0201 by Pearson Bolk, RRT  Airway/Ventilation Management:   airway patency maintained   humidification applied   pulmonary hygiene promoted  Taken 05/15/2024 1950 by Pearson Bolk, RRT  Head of Bed [facility]) Positioning: HOB at 30-45 degrees  Airway/Ventilation Management:   airway patency maintained   humidification applied   pulmonary hygiene promoted     Problem: Wound  Goal: Skin Health and Integrity  Intervention: Optimize Skin Protection  Recent Flowsheet Documentation  Taken 05/15/2024 1950 by Pearson Bolk, RRT  Head of Bed [facility]) Positioning: HOB at 30-45 degrees     Problem: Mechanical Ventilation Invasive  Goal: Optimal Device Function  Intervention: Optimize Device Care and Function  Recent Flowsheet Documentation  Taken 05/16/2024 0201 by Pearson Bolk, RRT  Airway/Ventilation Management:   airway patency maintained   humidification applied   pulmonary hygiene promoted  Taken 05/15/2024 1950 by Pearson Bolk, RRT  Airway/Ventilation Management:   airway patency maintained   humidification applied   pulmonary hygiene promoted  Oral Care:   mouth swabbed   oral rinse provided   suction provided   tongue brushed  Goal: Absence of Ventilator-Induced Lung Injury  Intervention: Prevent Ventilator-Associated Pneumonia  Recent Flowsheet Documentation  Taken 05/16/2024 0201 by Pearson Bolk, RRT  VAP Prevention Bundle:   HOB elevation maintained   vent circuit breaks minimized  Taken 05/15/2024 1950 by Pearson Bolk, RRT  Head of Bed [facility]) Positioning: HOB at 30-45 degrees  VAP Prevention Bundle:   HOB elevation maintained   oral care regularly provided   vent circuit breaks minimized  Oral Care:   mouth swabbed   oral rinse provided   suction provided   tongue brushed     Problem: Mechanical Ventilation Invasive  Goal: Optimal Device Function  Intervention: Optimize Device Care and Function  Recent Flowsheet Documentation  Taken 05/16/2024 0201 by Pearson Bolk, RRT  Airway/Ventilation Management:   airway patency maintained   humidification applied   pulmonary hygiene promoted  Taken 05/15/2024 1950 by Pearson Bolk, RRT  Airway/Ventilation Management:   airway patency maintained   humidification applied   pulmonary hygiene promoted  Oral Care:   mouth swabbed   oral rinse provided   suction provided   tongue brushed  Goal: Absence of Ventilator-Induced Lung Injury  Intervention: Prevent Ventilator-Associated Pneumonia  Recent Flowsheet Documentation  Taken 05/16/2024 0201 by Pearson Bolk, RRT  VAP Prevention Bundle:   HOB elevation maintained   vent circuit breaks minimized  Taken 05/15/2024 1950 by Pearson Bolk, RRT  Head of Bed [facility]) Positioning: HOB at 30-45 degrees  VAP Prevention Bundle:   HOB elevation maintained   oral care regularly  provided   vent circuit breaks minimized  Oral Care:   mouth swabbed   oral rinse provided   suction provided   tongue brushed     Problem: Mechanical Ventilation Invasive  Goal: Optimal Device Function  Intervention: Optimize Device Care and Function  Recent Flowsheet Documentation  Taken 05/16/2024 0201 by Pearson Bolk, RRT  Airway/Ventilation Management:   airway patency maintained   humidification applied   pulmonary hygiene promoted  Taken 05/15/2024 1950 by Pearson Bolk, RRT  Airway/Ventilation Management:   airway patency maintained   humidification applied   pulmonary hygiene promoted  Oral Care:   mouth swabbed   oral rinse provided   suction provided   tongue brushed  Goal: Absence of Ventilator-Induced Lung Injury  Intervention: Prevent Ventilator-Associated Pneumonia  Recent Flowsheet Documentation  Taken 05/16/2024 0201 by Pearson Bolk, RRT  VAP Prevention Bundle:   HOB elevation maintained   vent circuit breaks minimized  Taken 05/15/2024 1950 by Pearson Bolk, RRT  Head of Bed [facility]) Positioning: HOB at 30-45 degrees  VAP Prevention Bundle:   HOB elevation maintained   oral care regularly provided   vent circuit breaks minimized  Oral Care:   mouth swabbed   oral rinse provided   suction provided   tongue brushed     Problem: Mechanical Ventilation Invasive  Goal: Optimal Device Function  Intervention: Optimize Device Care and Function  Recent Flowsheet Documentation  Taken 05/16/2024 0201 by Pearson Bolk, RRT  Airway/Ventilation Management:   airway patency maintained   humidification applied   pulmonary hygiene promoted  Taken 05/15/2024 1950 by Pearson Bolk, RRT  Airway/Ventilation Management:   airway patency maintained   humidification applied   pulmonary hygiene promoted  Oral Care:   mouth swabbed   oral rinse provided   suction provided   tongue brushed  Goal: Absence of Ventilator-Induced Lung Injury  Intervention: Prevent Ventilator-Associated Pneumonia  Recent Flowsheet Documentation  Taken 05/16/2024 0201 by Pearson Bolk, RRT  VAP Prevention Bundle:   HOB elevation maintained   vent circuit breaks minimized  Taken 05/15/2024 1950 by Pearson Bolk, RRT  Head of Bed [facility]) Positioning: HOB at 30-45 degrees  VAP Prevention Bundle:   HOB elevation maintained   oral care regularly provided   vent circuit breaks minimized  Oral Care:   mouth swabbed   oral rinse provided   suction provided   tongue brushed     Problem: Mechanical Ventilation Invasive  Goal: Optimal Device Function  Intervention: Optimize Device Care and Function  Recent Flowsheet Documentation  Taken 05/16/2024 0201 by Pearson Bolk, RRT  Airway/Ventilation Management:   airway patency maintained   humidification applied   pulmonary hygiene promoted  Taken 05/15/2024 1950 by Pearson Bolk, RRT  Airway/Ventilation Management:   airway patency maintained   humidification applied   pulmonary hygiene promoted  Oral Care:   mouth swabbed   oral rinse provided   suction provided   tongue brushed  Goal: Absence of Ventilator-Induced Lung Injury  Intervention: Prevent Ventilator-Associated Pneumonia  Recent Flowsheet Documentation  Taken 05/16/2024 0201 by Pearson Bolk, RRT  VAP Prevention Bundle:   HOB elevation maintained   vent circuit breaks minimized  Taken 05/15/2024 1950 by Pearson Bolk, RRT  Head of Bed [facility]) Positioning: HOB at 30-45 degrees  VAP Prevention Bundle:   HOB elevation maintained   oral care regularly provided   vent circuit breaks minimized  Oral Care:   mouth swabbed   oral rinse provided   suction provided  tongue brushed     Problem: Mechanical Ventilation Invasive  Goal: Optimal Device Function  Intervention: Optimize Device Care and Function  Recent Flowsheet Documentation  Taken 05/16/2024 0201 by Pearson Bolk, RRT  Airway/Ventilation Management:   airway patency maintained   humidification applied   pulmonary hygiene promoted  Taken 05/15/2024 1950 by Pearson Bolk, RRT  Airway/Ventilation Management:   airway patency maintained   humidification applied   pulmonary hygiene promoted  Oral Care:   mouth swabbed   oral rinse provided   suction provided   tongue brushed  Goal: Absence of Ventilator-Induced Lung Injury  Intervention: Prevent Ventilator-Associated Pneumonia  Recent Flowsheet Documentation  Taken 05/16/2024 0201 by Pearson Bolk, RRT  VAP Prevention Bundle:   HOB elevation maintained   vent circuit breaks minimized  Taken 05/15/2024 1950 by Pearson Bolk, RRT  Head of Bed [facility]) Positioning: HOB at 30-45 degrees  VAP Prevention Bundle:   HOB elevation maintained   oral care regularly provided   vent circuit breaks minimized  Oral Care:   mouth swabbed   oral rinse provided   suction provided   tongue brushed     Problem: Artificial Airway  Goal: Optimal Device Function  Intervention: Optimize Device Care and Function  Recent Flowsheet Documentation  Taken 05/16/2024 0201 by Pearson Bolk, RRT  Airway/Ventilation Management:   airway patency maintained   humidification applied   pulmonary hygiene promoted  Taken 05/15/2024 1950 by Pearson Bolk, RRT  Airway/Ventilation Management:   airway patency maintained   humidification applied   pulmonary hygiene promoted  Oral Care:   mouth swabbed   oral rinse provided   suction provided   tongue brushed     Problem: Mechanical Ventilation Invasive  Goal: Optimal Device Function  Intervention: Optimize Device Care and Function  Recent Flowsheet Documentation  Taken 05/16/2024 0201 by Pearson Bolk, RRT  Airway/Ventilation Management:   airway patency maintained   humidification applied   pulmonary hygiene promoted  Taken 05/15/2024 1950 by Pearson Bolk, RRT  Airway/Ventilation Management:   airway patency maintained   humidification applied   pulmonary hygiene promoted  Oral Care:   mouth swabbed   oral rinse provided   suction provided   tongue brushed  Goal: Absence of Ventilator-Induced Lung Injury  Intervention: Prevent Ventilator-Associated Pneumonia  Recent Flowsheet Documentation  Taken 05/16/2024 0201 by Pearson Bolk, RRT  VAP Prevention Bundle:   HOB elevation maintained   vent circuit breaks minimized  Taken 05/15/2024 1950 by Pearson Bolk, RRT  Head of Bed [facility]) Positioning: HOB at 30-45 degrees  VAP Prevention Bundle:   HOB elevation maintained   oral care regularly provided   vent circuit breaks minimized  Oral Care:   mouth swabbed   oral rinse provided   suction provided   tongue brushed     Problem: Artificial Airway  Goal: Optimal Device Function  Intervention: Optimize Device Care and Function  Recent Flowsheet Documentation  Taken 05/16/2024 0201 by Pearson Bolk, RRT  Airway/Ventilation Management:   airway patency maintained   humidification applied   pulmonary hygiene promoted  Taken 05/15/2024 1950 by Pearson Bolk, RRT  Airway/Ventilation Management:   airway patency maintained   humidification applied   pulmonary hygiene promoted  Oral Care:   mouth swabbed   oral rinse provided   suction provided   tongue brushed   Patient stable throughout shift, placed on PRVC settings overnight with plans to resume PSV during day.  ETS for large amounts of thick, pale secretions. Jamal  is patent, secure. Emergency airway supplies at head of bed. Inhaled medication tolerated well.

## 2024-05-17 LAB — BASIC METABOLIC PANEL
ANION GAP: 9 mmol/L (ref 5–14)
BLOOD UREA NITROGEN: 20 mg/dL (ref 9–23)
BUN / CREAT RATIO: 43
CALCIUM: 8.2 mg/dL — ABNORMAL LOW (ref 8.7–10.4)
CHLORIDE: 101 mmol/L (ref 98–107)
CO2: 29 mmol/L (ref 20.0–31.0)
CREATININE: 0.47 mg/dL — ABNORMAL LOW (ref 0.55–1.02)
EGFR CKD-EPI (2021) FEMALE: >90 mL/min/1.73m2 (ref >=60)
GLUCOSE RANDOM: 128 mg/dL (ref 70–179)
POTASSIUM: 4.1 mmol/L (ref 3.4–4.8)
SODIUM: 139 mmol/L (ref 135–145)

## 2024-05-17 LAB — BLOOD GAS, VENOUS
BASE EXCESS VENOUS: 3.2 — ABNORMAL HIGH (ref -2.0–2.0)
CARBOXYHEMOGLOBIN, VENOUS: 1.4 % — ABNORMAL HIGH (ref <1.2)
HCO3 VENOUS: 27 mmol/L (ref 22–27)
METHEMOGLOBIN, VENOUS: <1 % (ref <1.5)
O2 SATURATION VENOUS: 61.1 % (ref 40.0–85.0)
OXYHEMOGLOBIN, VENOUS: 60.1 % (ref 40.0–85.0)
PCO2 VENOUS: 44 mmHg (ref 40–60)
PH VENOUS: 7.41 (ref 7.32–7.43)
PO2 VENOUS: 38 mmHg (ref 30–55)

## 2024-05-17 LAB — MAGNESIUM: MAGNESIUM: 2.2 mg/dL (ref 1.6–2.6)

## 2024-05-17 LAB — SODIUM, URINE, RANDOM: SODIUM URINE: 31 mmol/L

## 2024-05-17 MED ADMIN — rifAXIMin (XIFAXAN) oral suspension: 550 mg | GASTROENTERAL | @ 02:00:00 | Stop: 2025-04-11

## 2024-05-17 MED ADMIN — rifAXIMin (XIFAXAN) oral suspension: 550 mg | GASTROENTERAL | @ 14:00:00 | Stop: 2025-04-11

## 2024-05-17 MED ADMIN — folic acid (FOLVITE) tablet 1 mg: 1 mg | GASTROENTERAL | @ 14:00:00

## 2024-05-17 MED ADMIN — thiamine mononitrate (vit B1) tablet 100 mg: 100 mg | GASTROENTERAL | @ 14:00:00

## 2024-05-17 MED ADMIN — ergocalciferol (DRISDOL) oral drops 200 mcg/mL (8,000 unit/mL): 100 ug | GASTROENTERAL | @ 14:00:00 | Stop: 2024-05-21

## 2024-05-17 MED ADMIN — cyanocobalamin (vitamin B-12) tablet 1,000 mcg: 1000 ug | GASTROENTERAL | @ 14:00:00

## 2024-05-17 MED ADMIN — levETIRAcetam (KEPPRA) tablet 750 mg: 750 mg | GASTROENTERAL | @ 14:00:00

## 2024-05-17 MED ADMIN — levETIRAcetam (KEPPRA) tablet 750 mg: 750 mg | GASTROENTERAL | @ 02:00:00

## 2024-05-17 MED ADMIN — famotidine (PEPCID) tablet 20 mg: 20 mg | GASTROENTERAL | @ 14:00:00

## 2024-05-17 MED ADMIN — melatonin tablet 3 mg: 3 mg | GASTROENTERAL | @ 23:00:00

## 2024-05-17 MED ADMIN — apixaban (ELIQUIS) tablet 5 mg: 5 mg | GASTROENTERAL | @ 02:00:00

## 2024-05-17 MED ADMIN — apixaban (ELIQUIS) tablet 5 mg: 5 mg | GASTROENTERAL | @ 14:00:00

## 2024-05-17 MED ADMIN — multivitamins, therapeutic with minerals tablet 1 tablet: 1 | GASTROENTERAL | @ 14:00:00 | Stop: 2024-05-17

## 2024-05-17 MED ADMIN — lacosamide (VIMPAT) tablet 100 mg: 100 mg | GASTROENTERAL | @ 02:00:00

## 2024-05-17 MED ADMIN — lacosamide (VIMPAT) tablet 100 mg: 100 mg | GASTROENTERAL | @ 14:00:00

## 2024-05-17 MED ADMIN — ipratropium-albuterol (DUO-NEB) 0.5-2.5 mg/3 mL nebulizer solution 3 mL: 3 mL | RESPIRATORY_TRACT | @ 03:00:00

## 2024-05-17 MED ADMIN — ipratropium-albuterol (DUO-NEB) 0.5-2.5 mg/3 mL nebulizer solution 3 mL: 3 mL | RESPIRATORY_TRACT | @ 09:00:00

## 2024-05-17 MED ADMIN — ipratropium-albuterol (DUO-NEB) 0.5-2.5 mg/3 mL nebulizer solution 3 mL: 3 mL | RESPIRATORY_TRACT | @ 15:00:00

## 2024-05-17 MED ADMIN — ipratropium-albuterol (DUO-NEB) 0.5-2.5 mg/3 mL nebulizer solution 3 mL: 3 mL | RESPIRATORY_TRACT | @ 20:00:00

## 2024-05-17 MED ADMIN — metoPROLOL tartrate (Lopressor) tablet 12.5 mg: 12.5 mg | GASTROENTERAL | @ 05:00:00

## 2024-05-17 MED ADMIN — metoPROLOL tartrate (Lopressor) tablet 12.5 mg: 12.5 mg | GASTROENTERAL | @ 12:00:00

## 2024-05-17 MED ADMIN — metoPROLOL tartrate (Lopressor) tablet 12.5 mg: 12.5 mg | GASTROENTERAL | @ 16:00:00

## 2024-05-17 MED ADMIN — metoPROLOL tartrate (Lopressor) tablet 12.5 mg: 12.5 mg | GASTROENTERAL | @ 23:00:00

## 2024-05-17 MED ADMIN — potassium chloride 20 mEq in 100 mL IVPB Premix: 20 meq | INTRAVENOUS | @ 03:00:00 | Stop: 2024-05-16

## 2024-05-17 MED ADMIN — lactulose oral solution: 20 g | GASTROENTERAL | @ 02:00:00

## 2024-05-17 MED ADMIN — lactulose oral solution: 20 g | GASTROENTERAL | @ 20:00:00

## 2024-05-17 MED ADMIN — lactulose oral solution: 20 g | GASTROENTERAL | @ 14:00:00

## 2024-05-17 MED ADMIN — potassium chloride 20 mEq in 100 mL IVPB Premix: 20 meq | INTRAVENOUS | @ 02:00:00 | Stop: 2024-05-16

## 2024-05-17 MED ADMIN — acetaminophen (TYLENOL) tablet 650 mg: 650 mg | GASTROENTERAL | @ 06:00:00

## 2024-05-17 MED ADMIN — acetaminophen (TYLENOL) tablet 650 mg: 650 mg | GASTROENTERAL | @ 16:00:00

## 2024-05-17 MED ADMIN — hydrOXYzine (ATARAX) oral syrup: 25 mg | ORAL | @ 21:00:00 | Stop: 2024-05-17

## 2024-05-17 MED ADMIN — furosemide (LASIX) injection 40 mg: 40 mg | INTRAVENOUS | @ 23:00:00 | Stop: 2024-05-17

## 2024-05-17 NOTE — Consults (Signed)
 WIND Team Follow Up Consult Note     Date of Service: 05/17/2024  Requesting Physician: Emmie Gaylan Bob, MD   Requesting Service: Med General [facility] (MDW)  Reason for consultation: Comprehensive evaluation of tracheostomy management and mechanical ventilation.    Hospital Problems:  Principal Problem:    Bacteremia, escherichia coli  Active Problems:    Alcoholic liver disease (HHS-HCC)    HTN (hypertension)    Depression with anxiety    Class 2 severe obesity with serious comorbidity in adult    Atrial fibrillation    (CMS-HCC)    Pleural effusion    Hypernatremia    Thrombocytopenia    Cirrhosis    (CMS-HCC)    A-fib (CMS-HCC)    Hepatic encephalopathy    (CMS-HCC)    Anaphylaxis      HPI: [name] is a 78 y.o. 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 Has had numerous transfer between ICU and MPCU, most recently leaving the MICU on 12/12 after mgmt septic shock 2/2 PsA cellulitis vs PNA      Recommendations       Planned Vent Settings/Goals:  - Increased pressure support to hopefully minimize apnea, so far this has improved  - Continue PRVC overnight    - Tracheostomy  6 shiley  - Barriers to wean include mental status    #Nutrition Plan:   Continue enteral nutrition.    #Speech Plan:  Speaking valve when off vent. Still not ready    #Physical Therapy plan:   Mobilize as tolerated.           This patient was seen and evaluated with Dr. Mignon. Please do not hesitate to page [phone] Monday - Friday from 8AM-3PM with questions. We appreciate the opportunity to assist in the care of this patient. The recommendations outlined in this note were discussed w the primary team via epic chat. We look forward to following with you.     Maurilio LELON Leer, MD    Assessment          Impression: The patient has acute illness with systemic symptoms. My interpretation of the interval data I personally reviewed, interpreted and/or analyzed is notable for vent settings with FiO2 28%, PSV 16/8 with good oxygenation and gas exchange..      Problems addressed during this consult include Vent mgmt.       Subjective & Objective     Subjective: Sleeping, ongoing significant secretions         Vitals - past 24 hours  Temp:  [36.9 ??C (98.4 ??F)-37.4 ??C (99.3 ??F)] 37.1 ??C (98.8 ??F)  Pulse:  [86-115] 115  SpO2 Pulse:  [83-116] 100  Resp:  [17-35] 24  BP: (119-158)/(50-111) 158/111  FiO2 (%):  [28 %] 28 %  SpO2:  [99 %-100 %] 100 % Intake/Output  I/O last 3 completed shifts:  In: 4400 [NG/GT:4220; IV Piggyback:180]  Out: 3000 [Urine:1300; Stool:1700]      Pertinent exam findings:    General - NAD  HEENT - Trachea midline, anicteric  CV - RRR  Resp - equal chest rise, coarse breath sounds   GI - abdomen soft, non-tender non-distended  Skin -  no clubbing or cyanosis    Pertinent Imaging Data   - CXR on  12/8 with unchanged opacification and plueral effusion      Current vent settings:  S RR:  [14] 14  FiO2 (%):  [28 %] 28 %  S VT:  [320 mL] 320 mL  PC Set:  [15] 15  PR SUP:  [16 cm H20] 16 cm H20  O2 Device: Ventilator    Arterial Blood Gas:   No results for input(s): SPECTYPEART, PHART, PCO2ART, PO2ART, HCO3ART, BEART, O2SATART in the last 24 hours.     Venous Blood Gas:   Recent Labs     Units 05/17/24  0627   PHVEN  7.41   PCO2VEN mm Hg 44   PO2VEN mm Hg 38   HCO3VEN mmol/L 27   BEVEN  3.2*   O2SATVEN % 61.1        Cultures:  Blood Culture, Routine (no units)   Date Value   05/09/2024 No Growth at 5 days   05/09/2024 No Growth at 5 days     Urine Culture, Comprehensive (no units)   Date Value   05/08/2024 NO GROWTH     Lower Respiratory Culture (no units)   Date Value   05/08/2024 3+ Pseudomonas aeruginosa (A)   05/08/2024 1+ Staphylococcus aureus (A)     WBC (10*9/L)   Date Value   05/16/2024 6.9     WBC, UA (/HPF)   Date Value   05/08/2024 3          Allergies & Home Medications   Personally reviewed in Epic    Medications:  Personally reviewed in Epic, see recommendations for updates           Hospital Course:   - Refer to primary team notes.     Medical Decision Making on 05/17/2024     External Notes:    I reviewed the following: Progress note(s) , Consultant note(s) , Nursing note(s), and Therapist note(s)    Results:   I reviewed the following labs/reports for this consult - CBC unremarkable or unchanged from prior., chemistry unremarkable or unchanged from prior., and radiology reports unremarkable or unchanged from prior.    Tests Ordered:   See recommendations above    Historian: no independent historian required    Imaging: I independently viewed and interpreted the following studies for this consult - CXR images  and CT images       Risks of management:    - Management of ventilator settings.  - Management of airway clearance devices/medications.

## 2024-05-17 NOTE — Plan of Care (Signed)
 Problem: Mechanical Ventilation Invasive  Goal: Effective Communication  Outcome: Progressing  Intervention: Ensure Effective Communication  Flowsheets (Taken 05/17/2024 0508)  Trust Relationship/Rapport:   care explained   reassurance provided   thoughts/feelings acknowledged   choices provided  Communication Enhancement Strategies:   extra time allowed for response   healthcare instructions adapted   nonverbal strategies used  Goal: Optimal Device Function  Outcome: Progressing  Intervention: Optimize Device Care and Function  Flowsheets (Taken 05/17/2024 0508)  Airway/Ventilation Management:   airway patency maintained   calming measures promoted   humidification applied   pulmonary hygiene promoted  Oral Care:   mouth swabbed   oral rinse provided   suction provided   tongue brushed  Airway Safety Measures:   manual resuscitator/mask at bedside   oxygen flowmeter at bedside   suction at bedside  Note: Oral care provided by RN  Goal: Absence of Device-Related Skin and Tissue Injury  Outcome: Progressing  Intervention: Maintain Skin and Tissue Health  Flowsheets (Taken 05/17/2024 0508)  Device Skin Pressure Protection:   absorbent pad utilized/changed   tubing/devices free from skin contact  Goal: Absence of Ventilator-Induced Lung Injury  Outcome: Progressing  Intervention: Facilitate Lung-Protection Measures  Flowsheets (Taken 05/17/2024 0508)  Lung Protection Measures:   optimal PEEP applied   ventilator synchrony promoted   ventilator waveforms monitored  Intervention: Prevent Ventilator-Associated Pneumonia  Flowsheets (Taken 05/17/2024 0508)  Head of Bed (HOB) Positioning: HOB at 30-45 degrees  VAP Prevention Bundle:   HOB elevation maintained   oral care regularly provided   vent circuit breaks minimized  Oral Care:   mouth swabbed   oral rinse provided   suction provided   tongue brushed     Problem: Artificial Airway  Goal: Effective Communication  Outcome: Progressing  Intervention: Ensure Effective Communication  Flowsheets (Taken 05/17/2024 0508)  Trust Relationship/Rapport:   care explained   reassurance provided   thoughts/feelings acknowledged   choices provided  Communication Enhancement Strategies:   extra time allowed for response   healthcare instructions adapted   nonverbal strategies used  Goal: Optimal Device Function  Outcome: Progressing  Intervention: Optimize Device Care and Function  Flowsheets (Taken 05/17/2024 0508)  Airway/Ventilation Management:   airway patency maintained   calming measures promoted   humidification applied   pulmonary hygiene promoted  Oral Care:   mouth swabbed   oral rinse provided   suction provided   tongue brushed  Airway Safety Measures:   manual resuscitator/mask at bedside   oxygen flowmeter at bedside   suction at bedside  Goal: Absence of Device-Related Skin or Tissue Injury  Outcome: Progressing  Intervention: Maintain Skin and Tissue Health  Flowsheets (Taken 05/17/2024 0508)  Device Skin Pressure Protection:   absorbent pad utilized/changed   tubing/devices free from skin contact     Problem: Mechanical Ventilation Invasive  Goal: Mechanical Ventilation Liberation  Outcome: Ongoing - Unchanged  Intervention: Promote Extubation and Mechanical Ventilation Liberation  Flowsheets (Taken 05/17/2024 0508)  Environmental Support:   calm environment promoted   distractions minimized   rest periods encouraged  Sleep/Rest Enhancement:   awakenings minimized   noise level reduced   regular sleep/rest pattern promoted   relaxation techniques promoted   room darkened     Pt remains on the ventilator and settings have not changed. Inhaled medications given as ordered. Volara therapy initiated this shift and pt tolerated well. Airway remains patent and secured in place with trach ties. RT will continue to monitor.

## 2024-05-17 NOTE — Progress Notes (Signed)
 Internal Medicine (MEDW) Progress Note    Assessment & Plan:   [name] is a 78 y.o. female who is presenting to [facility] with Bacteremia, escherichia coli, in the setting of the following pertinent/contributing co-morbidities: HTN, MetALD cirrhosis, severe obseity, and persistent a fib.    Principal Problem:    Bacteremia, escherichia coli  Active Problems:    Alcoholic liver disease (HHS-HCC)    HTN (hypertension)    Depression with anxiety    Class 2 severe obesity with serious comorbidity in adult    Atrial fibrillation    (CMS-HCC)    Pleural effusion    Hypernatremia    Thrombocytopenia    Cirrhosis    (CMS-HCC)    A-fib (CMS-HCC)    Hepatic encephalopathy    (CMS-HCC)    Anaphylaxis      Brief hospital course:  Patient presented to [facility] initially for encephalopathy following an aborted DCCV (LAA thrombus found). She subsequently had a complex hospital stay with multiple ICU admissions, PEA arrest, multiple intubations and eventually tracheostomy. Currently weaning trach, and providing NGT feeds, but mental status continues to be poor. She was admitted to the MICU 06-03-24 for hypotension requiring norepinephrine , suspect septic shock 2/2 PsA PNA vs RLE cellulitis.    **Per daughter, she does yet not want to have her mother told that her husband passed away in hospice on 2024-06-03**    Active Problems    GOC   Multiple prior ACP discussions. Appreciate palliative care input. Goal is for functional recovery, unfortunately mental status appears to be very waxing/waning and per dw neuro, prognosis is uncertain and will take a long time to recover if she ever does. Family meeting held 12/4 over the phone, provided updates, daughter still wants to give her some time and would like to see her in person, unfortunately currently has a stomach bug  - Palliative care following, appreciate assistance in continuing to have ongoing GOC conversations    Encephalopathy, multifactorial - Focal non-convulsive seizures  Multiple drivers, prolonged ICU stays, cardiac arrest, metabolic derangements have been corrected, likely some hepatic encephalopathy. Did have subclinical seizures seen on EEG earlier in her course as well. Avoiding centrally acting medications. Intermittently opening eyes to verbal and tactile stimuli.  Of note, has become more encephalopathic in the past if she does not have adequate bowel movements. Mental status is waxing and waning. Decreased lactulose  given consistent high FMS output and persistent hypernatremia. Decreasing FWF now with improving fluid balance.   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  Multiple chest x-rays have shown pleural effusions.  Intubated 10/24-11/14. Failed extubation attempts and tracheostomy placed on 11/14.  Difficulty weaning pressure support and has required very slow decreases as she has become hypercarbic with faster weans. Have not observed episodes of apnea as previously noted since transfer out  of MICU. Discussed with [facility] team and witnessed increasing tachypnea while on PSV with relatively alert mental status; favor difficulty with trach wean is more intrinsic to respiratory status rather than encephalopathy.   - Volera BID  - Routine trach care per RT  - VBG qAM  - Duonebs, Hypertonic saline    Anasarca  Likely 2/2 prolonged critical illness, underlying cirrhosis, and hypoalbuminemia. Marked pitting edema in bilateral lower extremities ascending to thighs. Diuresis previously held in setting of hypotension, now restarting as blood pressures have improved.  Could consider increasing dose of Lasix  given urine output response has not been robust.  - IV lasix  40mg     C/f RLE cellulitis  CT RLE 12/9 with RLE cellulitis without underlying abscess or deep tracking soft tissue gas. S/p vanc. doxycycline  12/7-12/11 (completed course). Last fever 12/9. Skin swab culture positive for <1+ Pseudomonas. Exam with erythema of bilateral thighs, favor edema iso prolonged illness and volume resuscitation - not overtly infectious-appearing on exam. Patient also remains afebrile and clinically stable, will defer further antibiotics unless clinically worsens. BLE PVL 12/4 without signs of DVT.   - Continue to monitor    Ventilator associated pneumonia  CXR 12/8 with pleural effusions and possible consolidation that could represent pneumonia versus aspiration.  Likely secondary to Pseudomonas (as seen on lower respiratory culture) and MSSA. She continues to have increased thick secretions, but has reassuringly remained afebrile without increased ventilation needs.   - s/p Zosyn   - [facility] 12/7 (Tracheal aspirate) with 3+ Pseudomonas, 1+ staph aureus  - Trach care per RT    Enteral feeds  NGT in place.  - FWF q4h  - Nutrition following  - Continue B12, thiamine , folate. MVN    The patient's presentation is complicated by the following clinically significant conditions requiring additional evaluation and treatment: - Hypercoagulable state requiring additional attention to DVT prophylaxis and treatment or chronic anticoagulation secondary to Atrial Fibrillation   - Altered mental status secondary to - Metabolic Encephalopathy POA requiring further investigation or monitoring and - Delirium POA requiring further investigation or monitoring  - Age related debility POA requiring additional resources: DME, PT, or OT  - Hypoxia requiring further investigation, treatment, or monitoring  - Anemia requiring at least daily CBC for further monitoring    Chronic Problems    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 Stable ~8.  - Daily CBC    Persistent AFib - LAA thrombus  Continues to be in Afib. Rate controlled with medication. Episode of Afib with RVR today with unclear trigger, increased scheduled metoprolol  for rate control.   - Increased Metop tartrate 12.5 mg q6h   - Apixaban  5mg  BID     History of prolonged Qtc  - Regular EKGs  - Most recent EKG 12/5 with QTc Frederica 405    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)   -  Holding home anastrozole     Daily Checklist:  Diet: Tube Feeds  DVT PPx: Patient Already on Full Anticoagulation with eliquis   Electrolytes: Replete Potassium to >/= 3.6 and Magnesium  to >/= 1.8  Code Status: Full Code  Dispo: Transfer to [facility]    Team Contact Information:   Primary Team: Internal Medicine (MEDW)  Primary Resident: Zelda Moose, MD, MD  Resident's Pager: [phone] (Gen MedW Intern - [place])    Interval History:   No acute events overnight.    Patient eyes are closed, does not open to voice.  Moving bilateral upper extremities, appears bothered by the trach vent.    Objective:   Temp:  [36.9 ??C (98.4 ??F)-37.4 ??C (99.3 ??F)] 37.1 ??C (98.8 ??F)  Pulse:  [86-115] 91  SpO2 Pulse:  [83-116] 91  Resp:  [17-35] 19  BP: (119-167)/(50-111) 167/59  FiO2 (%):  [28 %] 28 %  SpO2:  [99 %-100 %] 100 %, Intake/Output Summary (Last 24 hours) at 05/17/2024 1611  Last data filed at 05/17/2024 1200  Gross per 24 hour   Intake 2890 ml   Output 1450 ml   Net 1440 ml     Gen: Lying in bed, not responsive to voice  HEENT: Dry MM, NGT in place with feeds running, trach in place  CV: Regular rate, irregular rhythm, no MRG  Pulm: Rhonchorous breath sounds bilaterally   Abd: Soft, non-tender, normal BS. FMS in place  GU: External catheter in place  Ext: Diffuse edema of bilateral extremities ascending to thighs. LUE PICC in place  Skin: Bilateral thighs erythematous

## 2024-05-17 NOTE — Plan of Care (Signed)
 Pt remains on mechanical ventilation w/ #6 shiley. Current vent settings are as follows; PRVC 320/14/+8/28%. Pt was priorly on PSV 16/+8 28% but had to be adjusted due to pt becoming apneic. Large amounts of thick, tan/yellow secretions are noted around trach and w/ sx. Trach care completed x2 today. Emergency supplies remain at bedside, see flowsheets for further.   Problem: Mechanical Ventilation Invasive  Goal: Effective Communication  Outcome: Ongoing - Unchanged  Goal: Optimal Device Function  Outcome: Ongoing - Unchanged  Goal: Mechanical Ventilation Liberation  Outcome: Ongoing - Unchanged  Goal: Optimal Nutrition Delivery  Outcome: Ongoing - Unchanged  Goal: Absence of Device-Related Skin and Tissue Injury  Outcome: Ongoing - Unchanged  Goal: Absence of Ventilator-Induced Lung Injury  Outcome: Ongoing - Unchanged     Problem: Artificial Airway  Goal: Effective Communication  Outcome: Ongoing - Unchanged  Goal: Optimal Device Function  Outcome: Ongoing - Unchanged  Goal: Absence of Device-Related Skin or Tissue Injury  Outcome: Ongoing - Unchanged

## 2024-05-17 NOTE — Plan of Care (Signed)
 Shift Summary  Tracheostomy site care and assessments were performed multiple times, with the site remaining patent and secure and oozing secretions managed.  Potassium chloride  was administered twice during the evening, and acetaminophen  was given PRN overnight.  Ventilator and airway safety measures were maintained, with optimal PEEP and synchrony promoted and airway suction performed as needed.  Communication strategies were adapted to support effective interaction and reassurance was provided throughout care.  Overall, oxygenation remained stable and device function was supported during the shift.    Optimal Gas Exchange: Respiratory rate fluctuated with periods of elevation, but oxygen saturation remained consistently high and airway patency was maintained throughout the shift; ventilator settings and interventions supported stable oxygenation.    Effective Communication: Extra time was allowed for responses and healthcare instructions were adapted, with nonverbal strategies used to facilitate communication during the shift.    Absence of Ventilator-Induced Lung Injury: Lung protection measures were implemented, including optimal PEEP and ventilator synchrony, and exhaled tidal volumes remained within a safe range for IBW.    Optimal Device Function: Tracheostomy remained patent and secure, with regular site and tie assessments, site care, and inner cannula maintenance performed; airway safety measures were in place at bedside.

## 2024-05-18 DIAGNOSIS — K7460 Unspecified cirrhosis of liver: Secondary | ICD-10-CM | POA: Diagnosis not present

## 2024-05-18 DIAGNOSIS — G9341 Metabolic encephalopathy: Secondary | ICD-10-CM | POA: Diagnosis not present

## 2024-05-18 DIAGNOSIS — R54 Age-related physical debility: Secondary | ICD-10-CM | POA: Diagnosis not present

## 2024-05-18 DIAGNOSIS — I4891 Unspecified atrial fibrillation: Secondary | ICD-10-CM | POA: Diagnosis not present

## 2024-05-18 LAB — BLOOD GAS, VENOUS
BASE EXCESS VENOUS: 3.1 — ABNORMAL HIGH (ref -2.0–2.0)
CARBOXYHEMOGLOBIN, VENOUS: 1.5 % — ABNORMAL HIGH (ref <1.2)
HCO3 VENOUS: 29 mmol/L — ABNORMAL HIGH (ref 22–27)
METHEMOGLOBIN, VENOUS: <1 % (ref <1.5)
O2 SATURATION VENOUS: 71.2 % (ref 40.0–85.0)
OXYHEMOGLOBIN, VENOUS: 69.8 % (ref 40.0–85.0)
PCO2 VENOUS: 47 mmHg (ref 40–60)
PH VENOUS: 7.39 (ref 7.32–7.43)
PO2 VENOUS: 42 mmHg (ref 30–55)

## 2024-05-18 LAB — CBC W/ AUTO DIFF
BASOPHILS ABSOLUTE COUNT: 0.1 10*9/L (ref 0.0–0.1)
BASOPHILS RELATIVE PERCENT: 1.1 %
EOSINOPHILS ABSOLUTE COUNT: 0.7 10*9/L — ABNORMAL HIGH (ref 0.0–0.5)
EOSINOPHILS RELATIVE PERCENT: 8.2 %
HEMATOCRIT: 25.3 % — ABNORMAL LOW (ref 34.0–44.0)
HEMOGLOBIN: 8.3 g/dL — ABNORMAL LOW (ref 11.3–14.9)
LYMPHOCYTES ABSOLUTE COUNT: 1.8 10*9/L (ref 1.1–3.6)
LYMPHOCYTES RELATIVE PERCENT: 21.6 %
MEAN CORPUSCULAR HEMOGLOBIN CONC: 33 g/dL (ref 32.0–36.0)
MEAN CORPUSCULAR HEMOGLOBIN: 29.7 pg (ref 25.9–32.4)
MEAN CORPUSCULAR VOLUME: 90.1 fL (ref 77.6–95.7)
MEAN PLATELET VOLUME: 8.7 fL (ref 6.8–10.7)
MONOCYTES ABSOLUTE COUNT: 0.8 10*9/L (ref 0.3–0.8)
MONOCYTES RELATIVE PERCENT: 9.9 %
NEUTROPHILS ABSOLUTE COUNT: 4.8 10*9/L (ref 1.8–7.8)
NEUTROPHILS RELATIVE PERCENT: 59.2 %
PLATELET COUNT: 222 10*9/L (ref 150–450)
RED BLOOD CELL COUNT: 2.8 10*12/L — ABNORMAL LOW (ref 3.95–5.13)
RED CELL DISTRIBUTION WIDTH: 18.4 % — ABNORMAL HIGH (ref 12.2–15.2)
WBC ADJUSTED: 8.1 10*9/L (ref 3.6–11.2)

## 2024-05-18 LAB — BASIC METABOLIC PANEL
ANION GAP: 9 mmol/L (ref 5–14)
BLOOD UREA NITROGEN: 20 mg/dL (ref 9–23)
BUN / CREAT RATIO: 47
CALCIUM: 8.2 mg/dL — ABNORMAL LOW (ref 8.7–10.4)
CHLORIDE: 100 mmol/L (ref 98–107)
CO2: 28 mmol/L (ref 20.0–31.0)
CREATININE: 0.43 mg/dL — ABNORMAL LOW (ref 0.55–1.02)
EGFR CKD-EPI (2021) FEMALE: >90 mL/min/1.73m2 (ref >=60)
GLUCOSE RANDOM: 130 mg/dL (ref 70–179)
POTASSIUM: 4 mmol/L (ref 3.4–4.8)
SODIUM: 137 mmol/L (ref 135–145)

## 2024-05-18 LAB — HEPATIC FUNCTION PANEL
ALBUMIN: 2 g/dL — ABNORMAL LOW (ref 3.4–5.0)
ALKALINE PHOSPHATASE: 220 U/L — ABNORMAL HIGH (ref 46–116)
ALT (SGPT): 9 U/L — ABNORMAL LOW (ref 10–49)
AST (SGOT): 42 U/L — ABNORMAL HIGH (ref <=34)
BILIRUBIN DIRECT: 0.3 mg/dL (ref 0.00–0.30)
BILIRUBIN TOTAL: 0.6 mg/dL (ref 0.3–1.2)
PROTEIN TOTAL: 6.2 g/dL (ref 5.7–8.2)

## 2024-05-18 LAB — PHOSPHORUS: PHOSPHORUS: 3.4 mg/dL (ref 2.4–5.1)

## 2024-05-18 LAB — CYSTATIN C
CYSTATIN C: 1.89 mg/L — ABNORMAL HIGH (ref 0.64–1.23)
EGFR CKD-EPI (2012) CYSTATIN C FEMALE: 29 mL/min/1.73m2 — ABNORMAL LOW (ref >=60)

## 2024-05-18 LAB — PRO-BNP: PRO-BNP: 1196 pg/mL — ABNORMAL HIGH (ref <=300.0)

## 2024-05-18 MED ORDER — ERGOCALCIFEROL (VITAMIN D2) 200 MCG/ML (8,000 UNIT/ML) ORAL DROPS
Freq: Every day | GASTROSTOMY | 0 refills | 30.00000 days | Status: CP
Start: 2024-05-18 — End: 2024-06-17

## 2024-05-18 MED ORDER — APIXABAN 5 MG TABLET
ORAL_TABLET | Freq: Two times a day (BID) | GASTROENTERAL | 0 refills | 30.00000 days | Status: CP
Start: 2024-05-18 — End: ?

## 2024-05-18 MED ORDER — LACTULOSE 20 GRAM/30 ML ORAL SOLUTION
Freq: Three times a day (TID) | GASTROENTERAL | 0 refills | 30.00000 days | Status: CP
Start: 2024-05-18 — End: 2024-06-17

## 2024-05-18 MED ORDER — MELATONIN 3 MG TABLET
ORAL_TABLET | Freq: Every evening | GASTROENTERAL | 0 refills | 30.00000 days | Status: CP
Start: 2024-05-18 — End: ?

## 2024-05-18 MED ORDER — LEVETIRACETAM 750 MG TABLET
ORAL_TABLET | Freq: Two times a day (BID) | GASTROSTOMY | 0 refills | 30.00000 days | Status: CP
Start: 2024-05-18 — End: 2024-06-17

## 2024-05-18 MED ORDER — METOPROLOL TARTRATE 25 MG TABLET
ORAL_TABLET | Freq: Four times a day (QID) | GASTROSTOMY | 0 refills | 30.00000 days | Status: CP
Start: 2024-05-18 — End: 2024-06-17

## 2024-05-18 MED ORDER — IPRATROPIUM 0.5 MG-ALBUTEROL 3 MG (2.5 MG BASE)/3 ML NEBULIZATION SOLN
Freq: Four times a day (QID) | RESPIRATORY_TRACT | 0 refills | 30.00000 days | Status: CP
Start: 2024-05-18 — End: ?

## 2024-05-18 MED ORDER — MULTIVITAMIN-IRON 9 MG-FOLIC ACID 400 MCG-CALCIUM AND MINERALS TABLET
ORAL_TABLET | Freq: Every day | ORAL | 0 refills | 30.00000 days | Status: CP
Start: 2024-05-18 — End: 2024-06-17

## 2024-05-18 MED ORDER — RIFAXIMIN 20 MG/ML ORAL SUSPENSION WAM
Freq: Two times a day (BID) | GASTROSTOMY | 0 refills | 30.00000 days | Status: CP
Start: 2024-05-18 — End: 2024-06-17

## 2024-05-18 MED ORDER — LACOSAMIDE 100 MG TABLET
ORAL_TABLET | Freq: Two times a day (BID) | GASTROENTERAL | 0 refills | 30.00000 days
Start: 2024-05-18 — End: 2024-06-17

## 2024-05-18 MED ORDER — ZINC SULFATE 50 MG ZINC (220 MG) CAPSULE
ORAL_CAPSULE | Freq: Every day | GASTROSTOMY | 0 refills | 30.00000 days | Status: CP
Start: 2024-05-18 — End: 2024-06-17

## 2024-05-18 MED ORDER — FAMOTIDINE 20 MG TABLET
ORAL_TABLET | Freq: Every day | GASTROSTOMY | 0 refills | 30.00000 days | Status: CP
Start: 2024-05-18 — End: 2024-06-17

## 2024-05-18 MED ADMIN — rifAXIMin (XIFAXAN) oral suspension: 550 mg | GASTROENTERAL | @ 01:00:00 | Stop: 2025-04-11

## 2024-05-18 MED ADMIN — rifAXIMin (XIFAXAN) oral suspension: 550 mg | GASTROENTERAL | @ 15:00:00 | Stop: 2024-05-18

## 2024-05-18 MED ADMIN — folic acid (FOLVITE) tablet 1 mg: 1 mg | GASTROENTERAL | @ 15:00:00 | Stop: 2024-05-18

## 2024-05-18 MED ADMIN — thiamine mononitrate (vit B1) tablet 100 mg: 100 mg | GASTROENTERAL | @ 15:00:00 | Stop: 2024-05-18

## 2024-05-18 MED ADMIN — ergocalciferol (DRISDOL) oral drops 200 mcg/mL (8,000 unit/mL): 100 ug | GASTROENTERAL | @ 15:00:00 | Stop: 2024-05-18

## 2024-05-18 MED ADMIN — cyanocobalamin (vitamin B-12) tablet 1,000 mcg: 1000 ug | GASTROENTERAL | @ 15:00:00 | Stop: 2024-05-18

## 2024-05-18 MED ADMIN — levETIRAcetam (KEPPRA) tablet 750 mg: 750 mg | GASTROENTERAL | @ 01:00:00

## 2024-05-18 MED ADMIN — levETIRAcetam (KEPPRA) tablet 750 mg: 750 mg | GASTROENTERAL | @ 15:00:00 | Stop: 2024-05-18

## 2024-05-18 MED ADMIN — famotidine (PEPCID) tablet 20 mg: 20 mg | GASTROENTERAL | @ 15:00:00 | Stop: 2024-05-18

## 2024-05-18 MED ADMIN — apixaban (ELIQUIS) tablet 5 mg: 5 mg | GASTROENTERAL | @ 15:00:00 | Stop: 2024-05-18

## 2024-05-18 MED ADMIN — apixaban (ELIQUIS) tablet 5 mg: 5 mg | GASTROENTERAL | @ 01:00:00

## 2024-05-18 MED ADMIN — lacosamide (VIMPAT) tablet 100 mg: 100 mg | GASTROENTERAL | @ 15:00:00 | Stop: 2024-05-18

## 2024-05-18 MED ADMIN — lacosamide (VIMPAT) tablet 100 mg: 100 mg | GASTROENTERAL | @ 01:00:00

## 2024-05-18 MED ADMIN — ipratropium-albuterol (DUO-NEB) 0.5-2.5 mg/3 mL nebulizer solution 3 mL: 3 mL | RESPIRATORY_TRACT | @ 14:00:00 | Stop: 2024-05-18

## 2024-05-18 MED ADMIN — ipratropium-albuterol (DUO-NEB) 0.5-2.5 mg/3 mL nebulizer solution 3 mL: 3 mL | RESPIRATORY_TRACT | @ 08:00:00 | Stop: 2024-05-18

## 2024-05-18 MED ADMIN — ipratropium-albuterol (DUO-NEB) 0.5-2.5 mg/3 mL nebulizer solution 3 mL: 3 mL | RESPIRATORY_TRACT | @ 03:00:00

## 2024-05-18 MED ADMIN — metoPROLOL tartrate (Lopressor) tablet 12.5 mg: 12.5 mg | GASTROENTERAL | @ 05:00:00 | Stop: 2024-05-18

## 2024-05-18 MED ADMIN — metoPROLOL tartrate (Lopressor) tablet 12.5 mg: 12.5 mg | GASTROENTERAL | @ 10:00:00 | Stop: 2024-05-18

## 2024-05-18 MED ADMIN — metoPROLOL tartrate (Lopressor) tablet 12.5 mg: 12.5 mg | GASTROENTERAL | @ 17:00:00 | Stop: 2024-05-18

## 2024-05-18 MED ADMIN — zinc sulfate (ZINCATE) capsule 220 mg: 220 mg | GASTROENTERAL | @ 15:00:00 | Stop: 2024-05-18

## 2024-05-18 MED ADMIN — lactulose oral solution: 20 g | GASTROENTERAL | @ 15:00:00 | Stop: 2024-05-18

## 2024-05-18 MED ADMIN — lactulose oral solution: 20 g | GASTROENTERAL | @ 01:00:00

## 2024-05-18 MED ADMIN — multivitamins, therapeutic with minerals tablet 1 tablet: 1 | GASTROENTERAL | @ 15:00:00 | Stop: 2024-05-18

## 2024-05-18 MED ADMIN — flu vacc 2025-26 (65 yr up) (PF)(FLUAD)45 mcg(15mcgx3)/0.5 ml IM syringe: .5 mL | INTRAMUSCULAR | @ 17:00:00 | Stop: 2024-05-18

## 2024-05-18 NOTE — Plan of Care (Addendum)
 Problem: Mechanical Ventilation Invasive  Goal: Mechanical Ventilation Liberation  Outcome: Ongoing - Unchanged     Problem: Artificial Airway  Goal: Optimal Device Function  Outcome: Ongoing - Unchanged  Note: Trach care completed. The pt remains on PRVC 15/350/+5/30% tolerating well. All treatments given as ordered. Pt noted to have a moderate, yellow/tan, thick amount of in-line secretions pre and post volera. All emergency supplies remain at the bedside. VSS and NAD noted at this time. Will continue to monitor.   Intervention: Optimize Device Care and Function  Flowsheets (Taken 05/18/2024 0607)  Airway/Ventilation Management:   airway patency maintained   calming measures promoted   humidification applied   pulmonary hygiene promoted  Aspiration Precautions:   respiratory status monitored   upright posture maintained  Airway Safety Measures:   manual resuscitator/mask at bedside   oxygen flowmeter at bedside   suction at bedside

## 2024-05-18 NOTE — Discharge Summary (Signed)
 Physician Discharge Summary    Admit date: 03/10/2024    Discharge date and time: 05/18/2024 at 1230 pm    Discharge to: Facility- [facility]    Discharge Service: [facility] (MDW)    Discharge Attending Physician: Emmie Gaylan Bob, MD    Discharge Diagnoses: altered mental status    Procedures: EEG, NGT, s/p trach, arterial line, flexible bronch w/ BAL, PICC line,  see below    Pertinent Test Results: Na on discharge: 137    Hospital Course:  To Do:  [ ]  Patient has had NGT for 6 weeks, please place PEG ASAP  [ ]  Attempted diuresis s/p lasix  40 on 12/16, please consider lasix  80 IV   [ ]  Have been titrating free water  flushes down as hypernatremia seems resolved, continue to down titrate as needed  [ ]  Family would like facility to know that it takes a long time for anesthesia to wear off after procedures for patient      Outpatient Provider Follow-up:  [ ]  Repeat TEE to assess for LAA thrombus resolution  [ ]  Colonoscopy for incidental finding concerning for sigmoid colon malignancy  [ ]  Referral for PASS clinic, Dr. Joycelyn messaged: re assess the risk of recurrent seizures and evaluate the need of continuing or not with anti-seizure medication   [facility]  ATTN: PASS Clinic  Fax: [phone]  Phone: [phone]  (Option 2)   Email: [email or web address]  [ ]  Please order a 70 min long EEG to be done before the clinic visit (within three months after discharge)           Brief Hospital Course:   EVANGELYN CROUSE is a 78 y.o. female with HFpEF, HTN, Afib on AC, MASLD cirrhosis, originally hospitalized for scheduled TEE/DCCV (aborted d/t LAA clot) c/b AHHRF, encephalopathy and septic shock requiring MICU care, now s/p extubation transferred from [facility] for further management of anticoagulation and hypernatremia. Transferred to [facility] MICU for closer monitoring for respiratory status, pressor requirement, GI bleed, and management of carotid artery injury.  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  Ms. Guevarra' hospitalization has been complicated by numerous extubations and reintubation's.  Currently on intubation #4.  Now s/p tracheostomy on 04/15/2024. Ongoing GOC discussion, last conversation on 12/9, daughter would like to pursue all measures.    Important dates  10/9   Admitted for somnolence and difficulty rousing during TEE for Direct Current Cardioversion for Afib.   10/10-10/12   Intubated for airway protection due to further increased somnolence.  After the intubation, she began experiencing bright red blood per rectum and was thus transferred to main campus MICU due to c/f GI bleed.  Required pressor support and antibiotics due to GNR bacteremia.   10/13  More responsive, Extubated   10/14-10/23  Transferred out of ICU, back to [facility]. Was improving and planning to go to [facility]  10/24  Acute decompensation with altered mental status and hypercarbic respiratory failure requiring intubation. followed by cardiac arrest with ROSC achieved.    Imaging done after cardiac arrest revealed extravasation into the gastric fundus possibly secondary to traumatic ET tube placement.  Transferred to [facility] Main campus for GI evaluation.  Central line was placed shortly after ROSC was achieved.  This was complicated by an carotid/venous fistula prompting transfer to [facility] campus for vascular surgery.  10/25  GI scoped her and  clipped an area of active bleeding, also removed a total of 1.5 L of blood.  10/26  Vascular surgery stated that fistula has healed on ultrasound  10/27  Continued poor mentation, speculate it is in the setting of missing lactulose  doses precipitating hepatic encephalopathy.  Treating with lactulose  and awaiting mentation improvement before extubation.  10/31-11/1  Mentation improved, extubated 10/31. Made stepdown status 11/2  Reintubated due to increasing lethargy and inability to clear secretions.  Bronchoscopy was done after chest x-ray revealed complete opacification of the left lung.  11/5   Self extubated.  Conversation held with daughter regarding next steps that should we need to intubate again.  Daughter understands that next intubation would likely lead to an early tracheostomy.  11/6  Reintubated due to hypercarbia and inability to protect airway  11/14  Trach placed c/b anaphylactic reaction to rocuronium    11/29  Transferred out of MICU for ongoing vent wean, supportive care  12/4  GOC discussion, want to continue to give her time to recover.   12/7  Rapid response for hypotension, transferred to the MICU for pressor support.  12/9  Off pressors. Changed level of care to stepdown status.    Hospital Course By Problem:  PEA arrest s/p ROSC  Patient with PEA arrest peri-intubation with ROSC. Shock thought to be distributive versus hemorrhagic in the setting of GI bleed History of atrial fibrillation with known LAA clot and HFpEF. Vasopressors were weaned off 10/27.    Altered mental status, multifactorial  Hypercarbia  Hepatic Encephalopathy  Non-convulsive seizures  Multiple drivers throughout hospitalization including hypercarbia, hepatic encephalopathy, septic shock, ICU delirium and seizures. Mental status waxed and waned during hospitalization and she intermittently did seem able to express herself and follow instructions. Despite controlling all of the potential  causes, her mental status was very slow to improve. Treated with Keppra , Lacosamide , and Lactulose . On 12/5 she was re-evaluated by neurology after repeat MRI showed improvement in areas of cortical restriction. However without clinical improvement, it is hard to say to what degree she will improve if at all. Per discussion with her daughter, she too months to recover from an episode of HE previously, so she would like to give her time     Carotid Artery Injury (resolved)  At [facility], CVC placed intended for RIJ on 10/24 following intubation, PEA, ROSC. Was used with known blood return. Unclear if it was used for pressor support, but highly likely that it was. CXR for placement check showed concern for intra aterial location, and blood gas confirmed it was arterial. Thought to be in carotid artery. CVC removed with pressure held for 15 minutes, with no hematoma formation. On arrival to [facility], bedside ultrasound showed no large hematoma. No active bleeding.  As of 10/26, repeat carotid ultrasound showed spontaneous resolution of the fistula per vascular.  Vascular stated that they would not be any intervention required from their standpoint.    Upper GIB 2/2 traumatic enteric tube placement  Sigmoid Mass c/f Malignancy and Hematochezia  CT abdomen pelvis done 10/24 with evidence of cirrhosis and masslike thickening of the lower sigmoid colon concerning for malignancy.  CTA abdomen pelvis performed 10/24 with active extravasation into the gastric fundus possibly secondary to traumatic enteric tube placement.  GI scoped the patient on 10/25 and clipped an area of active bleeding. Received supportive transfusions. Should have outpatient colonoscopy if in line with GOC    Acute Hypoxic Hypercarbic Respiratory Failure   Difficulty weaning off vent  Multiple instances of respiratory decompensation. Most recently, RRT called on 10/24 for increased somnolence, found to have acute hypercarbic respiratory failure and subsequently intubated and extubated multiple times. Eventually receiving trach 11/14.     VAP 2/2 PsA and MSSA  Cellulitis  Recurrent Sepsis  Treated for VAP and was treated for Cellulitis     Shock, hemorrhagic versus septic  Recurrent episodes of shock both GI and sepsis as above    Anasarca:  In setting of cirrhosis, volume resuscitation 2/2 shock, and poor nutrition. Difficulty diuresing 2/2 hypotension. Patient received IV lasix  40 on 12/16, please consider increasing to IV lasix  80 at facility.     Atrial Fibrillation, LA appendage thrombus   Atrial fibrillation discovered at previous admission that is rate controlled on chronic carvedilol .  She presented 03/10/24 for TEE/DCCV. TEE showed LA thrombus with DCCV cancelled. Plan is for pt to follow-up with Dr. Chad LEE in 4-6 weeks. She will be rescheduled for DCCV after 30 days anticoagulation without interruption.    Decompensated MetALD cirrhosis   Volume: anasarca as above, multiple POCUS evals without ascites  Infection: no e/o SBP, other infections treated as above  Bleeding: had MWT and traumatic NGT insertion earlier in course, continue PPI BID, no e/o varices  Encephalopathy: Rifaxamin and Lactulose  to goal of stool output via FM    Chronic Problems     Vertebral Compression Fracture  T12 vertebral compression fracture with retropulsion into the ventral spinal canal on CT. Has known history of osteoporosis identified on Dexa scan of femoral neck (11/2022 T score -2.9).       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)  Off anastrozole  1 mg daily while critically ill, continued at discharge       Exam:  Gen: Lying in bed, opens eyes and regards but does not follow commands  HEENT: Dry MM, NGT in place with feeds running, trach in place  CV: Warm, well perfused  Pulm: Regular work of breathing  Abd: Soft, non-tender  GU: External catheter in place  Ext: Diffuse edema of bilateral extremities ascending to thighs. LUE PICC in place  Skin: Bilateral thighs erythematous       Condition at Discharge: stable  Discharge Medications:      Your Medication List        PAUSE taking these medications      nystatin  100,000 unit/gram powder  Wait to take this until your doctor or other care provider tells you to start again.  Commonly known as: MYCOSTATIN   Apply to affected area 3 times daily  Ask about: Should I take this medication?     spironolactone  25 MG tablet  Wait to take this until your doctor or other care provider tells you to start again.  Commonly known as: ALDACTONE   Take 1 tablet (25 mg total) by mouth daily.            STOP taking these medications      carvedilol  3.125 MG tablet  Commonly known as: COREG      furosemide  40 MG tablet  Commonly known as: LASIX      magnesium  oxide 400 mg (241.3 mg elemental) tablet  Commonly known as: MAG-OX     potassium chloride  20 MEQ ER tablet     vitamin E-268 mg (400 UNIT) 268 mg (400 UNIT) capsule            START taking these medications  apixaban  5 mg Tab  Commonly known as: ELIQUIS   1 tablet (5 mg total) by Enteral tube: gastric route two (2) times a day.     ergocalciferol  200 mcg/mL (8,000 unit/mL) drops  Commonly known as: DRISDOL   0.5 mL (100 mcg total) by G-tube route daily.     famotidine  20 MG tablet  Commonly known as: PEPCID   1 tablet (20 mg total) by G-tube route daily.     ipratropium-albuterol  0.5-2.5 mg/3 mL nebulizer  Commonly known as: DUO-NEB  Inhale 3 mL by nebulization every six (6) hours.     lacosamide  100 mg Tab  Commonly known as: VIMPAT   1 tablet (100 mg total) by Enteral tube: gastric route two (2) times a day.     lactulose  20 gram/30 mL Soln  30 mL (20 g total) by Enteral tube: gastric route Three (3) times a day.     levETIRAcetam  750 MG tablet  Commonly known as: KEPPRA   1 tablet (750 mg total) by G-tube route two (2) times a day.     melatonin 3 mg Tab  1 tablet (3 mg total) by Enteral tube: gastric route every evening.     metoPROLOL  tartrate 25 MG tablet  Commonly known as: Lopressor   0.5 tablets (12.5 mg total) by G-tube route every six (6) hours.     multivitamins, therapeutic with minerals 9 mg iron -400 mcg tablet  Take 1 tablet by mouth daily.     rifAXIMin  (XIFAXAN ) oral suspension 20 mg/mL  27.5 mL (550 mg total) by G-tube route two (2) times a day.     zinc  sulfate 220 mg (50 mg elemental zinc ) capsule  Commonly known as: ZINCATE  1 capsule (220 mg total) by G-tube route daily.            CONTINUE taking these medications      acetaminophen  325 MG tablet  Commonly known as: TYLENOL   Take 2 tablets (650 mg total) by mouth every six (6) hours as needed for pain.     anastrozole  1 mg tablet  Commonly known as: ARIMIDEX   Take 1 tablet (1 mg total) by mouth daily.     cyanocobalamin  (vitamin B-12) 1000 MCG tablet  Take 1 tablet (1,000 mcg total) by mouth daily.     folic acid  1 MG tablet  Commonly known as: FOLVITE   Take 1 tablet (1 mg total) by mouth daily.     thiamine  100 MG tablet  Commonly known as: B-1  Take 1 tablet (100 mg total) by mouth daily.            ASK your doctor about these medications      calcium  carbonate 1,250 mg (500 mg elem calcium ) tablet  Commonly known as: OS-CAL  Take 1 tablet (500 mg elem calcium  total) by mouth in the morning.              Pending Test Results:       Discharge Instructions:   Activity Instructions    Instructions after Anesthesia:    Do not drink alcoholic beverages, sign legal documents, or operate heavy machinery for the next 24 hours or while taking narcotic pain medications.  You may feel light headed and/or dizzy. Get up slowly and rest for the next 24 hours even if you are feeling well.    If you experience the following please refer to the number provided by the surgeon or contact the doctor on call at [phone]:    If you are  unable to urinate when your bladder feels full or if you have not urinated in 8 hours.  If you are unable to tolerate fluids within 8 hours of discharge.           Diet Instructions      Nausea:   Occasionally patients experience nausea (upset stomach) following surgery which may be due to anesthesia or the pain medication you are taking.  Eating small meals and drinking lots of fluids can help decrease nausea.  It is important NOT to take your pain medication on an empty stomach as this can make your nausea worse.   If you experience persistent nausea or vomiting please contact your surgeon's office or the physician on call after hours.     Diet:   Resume your normal diet.   Gradually drink 8-10 glasses of non-caffeinated liquid per day.              Appointments which have been scheduled for you      Jun 23, 2024 12:30 PM  (Arrive by 12:15 PM)  HOSPITAL FOLLOW UP with Chrystine GORMAN Notice, MD  [facility] GI MEDICINE [place] [facility]) [address]  [facility] [address]  [place] [address]  [phone]        Jul 26, 2024 9:00 AM  (Arrive by 8:45 AM)  MAMMO SCREENING BILATERAL TOMO with [facility] MAMMO RM 1  [facility] Breast Imaging Department [facility] [place]) [address]  [place] [address]  [phone]   Please wear a two piece outfit,   Do not wear deodorant, powder, oils or lotion on your chest and underarms.       Jul 26, 2024 10:00 AM  (Arrive by 9:45 AM)  OFFICE VISIT with Franky Marsa Sprinkles, MD  [facility] RADIATION ONCOLOGY [facility] REG[facility]) [address]  [address]  [place] [address]  [phone]   We have moved back to our original location [address]                 I spent greater than 30 minutes in the discharge of this patient.    Rae Band, MD PGY-1  [facility] Neurology

## 2024-05-18 NOTE — Plan of Care (Signed)
 Shift Summary  Pressure reduction and skin protection interventions were consistently applied, including frequent weight shifts, use of pressure-redistributing mattress, and padding of skin-to-skin areas.  Pt nonverbal, intermittently following commands, mouthing inappropriate words. HR a fib, rate controlled, briefly >120, MD aware. Other VSS, O2>92% on vent 28%. NGT in place, TF administered as ordered, tolerating TF at goal rate. No s/sx of pain. Foley intact, decreased UO, MD aware. FMS in place. Q2H turns. Bilateral mitts in place. See FS/MAR for further info.     Skin Health and Integrity: Pressure reduction techniques and devices were consistently utilized throughout the shift, and skin protection measures such as limited adhesive use, incontinence pads, and padding of skin-to-skin areas were maintained; tracheostomy site care was performed when oozing secretions were noted, with dressing changes and cleansing documented, and the site was otherwise clean and dry.     Improved Ability to Complete Activities of Daily Living: Mobility remained very limited during the shift, with frequent assistance provided for weight shifting and positioning, and pillows were consistently used to support transfers.     Patient will remain free of physical injury: Fall reduction interventions were maintained, including hourly visual checks, bed in lowest position, side rails up, and alarms activated; no injuries were documented during the shift.

## 2024-05-18 NOTE — Plan of Care (Incomplete)
 Pt nonverbal. HR 90-120's afib. SBP 110-140's. Q2 turns completed. CHG bath + catheter care provided prior to departure. Report called to [facility] RN at [facility]). Spoke w/ daughter via phone as well. All belongings sent with patient as well as emergency trach supplies. Report given to transport RN. See MAR/FS for further details.

## 2024-05-19 DIAGNOSIS — G40909 Epilepsy, unspecified, not intractable, without status epilepticus: Secondary | ICD-10-CM | POA: Diagnosis not present

## 2024-05-19 DIAGNOSIS — Z930 Tracheostomy status: Secondary | ICD-10-CM | POA: Diagnosis not present

## 2024-05-19 DIAGNOSIS — I482 Chronic atrial fibrillation, unspecified: Secondary | ICD-10-CM | POA: Diagnosis not present

## 2024-05-19 DIAGNOSIS — G9341 Metabolic encephalopathy: Secondary | ICD-10-CM | POA: Diagnosis not present

## 2024-05-19 DIAGNOSIS — J9621 Acute and chronic respiratory failure with hypoxia: Secondary | ICD-10-CM | POA: Diagnosis not present

## 2024-05-20 DIAGNOSIS — J9621 Acute and chronic respiratory failure with hypoxia: Secondary | ICD-10-CM | POA: Diagnosis not present

## 2024-05-20 DIAGNOSIS — G9341 Metabolic encephalopathy: Secondary | ICD-10-CM | POA: Diagnosis not present

## 2024-05-20 DIAGNOSIS — I482 Chronic atrial fibrillation, unspecified: Secondary | ICD-10-CM | POA: Diagnosis not present

## 2024-05-20 DIAGNOSIS — Z930 Tracheostomy status: Secondary | ICD-10-CM | POA: Diagnosis not present

## 2024-05-20 DIAGNOSIS — G40909 Epilepsy, unspecified, not intractable, without status epilepticus: Secondary | ICD-10-CM | POA: Diagnosis not present

## 2024-05-21 DIAGNOSIS — G9341 Metabolic encephalopathy: Secondary | ICD-10-CM | POA: Diagnosis not present

## 2024-05-21 DIAGNOSIS — J9621 Acute and chronic respiratory failure with hypoxia: Secondary | ICD-10-CM | POA: Diagnosis not present

## 2024-05-21 DIAGNOSIS — Z930 Tracheostomy status: Secondary | ICD-10-CM | POA: Diagnosis not present

## 2024-05-21 DIAGNOSIS — G40909 Epilepsy, unspecified, not intractable, without status epilepticus: Secondary | ICD-10-CM | POA: Diagnosis not present

## 2024-05-21 DIAGNOSIS — I482 Chronic atrial fibrillation, unspecified: Secondary | ICD-10-CM | POA: Diagnosis not present

## 2024-05-22 DIAGNOSIS — I482 Chronic atrial fibrillation, unspecified: Secondary | ICD-10-CM | POA: Diagnosis not present

## 2024-05-22 DIAGNOSIS — Z930 Tracheostomy status: Secondary | ICD-10-CM | POA: Diagnosis not present

## 2024-05-22 DIAGNOSIS — G40909 Epilepsy, unspecified, not intractable, without status epilepticus: Secondary | ICD-10-CM | POA: Diagnosis not present

## 2024-05-22 DIAGNOSIS — J9621 Acute and chronic respiratory failure with hypoxia: Secondary | ICD-10-CM | POA: Diagnosis not present

## 2024-05-22 DIAGNOSIS — G9341 Metabolic encephalopathy: Secondary | ICD-10-CM | POA: Diagnosis not present

## 2024-05-23 DIAGNOSIS — I4891 Unspecified atrial fibrillation: Secondary | ICD-10-CM | POA: Diagnosis not present

## 2024-05-23 DIAGNOSIS — I482 Chronic atrial fibrillation, unspecified: Secondary | ICD-10-CM | POA: Diagnosis not present

## 2024-05-23 DIAGNOSIS — G40909 Epilepsy, unspecified, not intractable, without status epilepticus: Secondary | ICD-10-CM | POA: Diagnosis not present

## 2024-05-23 DIAGNOSIS — R54 Age-related physical debility: Secondary | ICD-10-CM | POA: Diagnosis not present

## 2024-05-23 DIAGNOSIS — K7460 Unspecified cirrhosis of liver: Secondary | ICD-10-CM | POA: Diagnosis not present

## 2024-05-23 DIAGNOSIS — G9341 Metabolic encephalopathy: Secondary | ICD-10-CM

## 2024-05-23 DIAGNOSIS — J9621 Acute and chronic respiratory failure with hypoxia: Secondary | ICD-10-CM | POA: Diagnosis not present

## 2024-05-23 DIAGNOSIS — Z930 Tracheostomy status: Secondary | ICD-10-CM | POA: Diagnosis not present

## 2024-05-23 NOTE — Telephone Encounter (Signed)
-----   Message from Buchanan W sent at 05/23/2024 10:23 AM EST -----  Hi,    I have contacted the patient and the appointment is scheduled.    Thanks!    Janus MARLA Masker  ----- Message -----  From: Joycelyn Lorenso Jasmine, MD  Sent: 05/22/2024  12:40 PM EST  To: [facility] Neurology Scheduling And Referrals [place]#    Dear [facility] Neurology scheduling,     Could you please make an appointment for my PASS clinic for this patient for April or may?     Please let the patient know that I will be seeing her for neurorecovery, to assess her seizures risk and manage her antiseizure medication        Muchas gracias    Clio

## 2024-05-24 DIAGNOSIS — I4891 Unspecified atrial fibrillation: Secondary | ICD-10-CM | POA: Diagnosis not present

## 2024-05-24 DIAGNOSIS — Z930 Tracheostomy status: Secondary | ICD-10-CM | POA: Diagnosis not present

## 2024-05-24 DIAGNOSIS — G40909 Epilepsy, unspecified, not intractable, without status epilepticus: Secondary | ICD-10-CM | POA: Diagnosis not present

## 2024-05-24 DIAGNOSIS — I482 Chronic atrial fibrillation, unspecified: Secondary | ICD-10-CM | POA: Diagnosis not present

## 2024-05-24 DIAGNOSIS — R54 Age-related physical debility: Secondary | ICD-10-CM | POA: Diagnosis not present

## 2024-05-24 DIAGNOSIS — J9621 Acute and chronic respiratory failure with hypoxia: Secondary | ICD-10-CM | POA: Diagnosis not present

## 2024-05-24 DIAGNOSIS — K7460 Unspecified cirrhosis of liver: Secondary | ICD-10-CM | POA: Diagnosis not present

## 2024-05-24 DIAGNOSIS — G9341 Metabolic encephalopathy: Secondary | ICD-10-CM

## 2024-05-25 DIAGNOSIS — I4891 Unspecified atrial fibrillation: Secondary | ICD-10-CM | POA: Diagnosis not present

## 2024-05-25 DIAGNOSIS — G9341 Metabolic encephalopathy: Secondary | ICD-10-CM

## 2024-05-25 DIAGNOSIS — R54 Age-related physical debility: Secondary | ICD-10-CM | POA: Diagnosis not present

## 2024-05-25 DIAGNOSIS — G40909 Epilepsy, unspecified, not intractable, without status epilepticus: Secondary | ICD-10-CM | POA: Diagnosis not present

## 2024-05-25 DIAGNOSIS — J9621 Acute and chronic respiratory failure with hypoxia: Secondary | ICD-10-CM | POA: Diagnosis not present

## 2024-05-25 DIAGNOSIS — I482 Chronic atrial fibrillation, unspecified: Secondary | ICD-10-CM | POA: Diagnosis not present

## 2024-05-25 DIAGNOSIS — Z930 Tracheostomy status: Secondary | ICD-10-CM | POA: Diagnosis not present

## 2024-05-25 DIAGNOSIS — K7460 Unspecified cirrhosis of liver: Secondary | ICD-10-CM | POA: Diagnosis not present

## 2024-05-26 DIAGNOSIS — I482 Chronic atrial fibrillation, unspecified: Secondary | ICD-10-CM | POA: Diagnosis not present

## 2024-05-26 DIAGNOSIS — K7460 Unspecified cirrhosis of liver: Secondary | ICD-10-CM | POA: Diagnosis not present

## 2024-05-26 DIAGNOSIS — I4891 Unspecified atrial fibrillation: Secondary | ICD-10-CM | POA: Diagnosis not present

## 2024-05-26 DIAGNOSIS — G40909 Epilepsy, unspecified, not intractable, without status epilepticus: Secondary | ICD-10-CM | POA: Diagnosis not present

## 2024-05-26 DIAGNOSIS — G9341 Metabolic encephalopathy: Secondary | ICD-10-CM

## 2024-05-26 DIAGNOSIS — R54 Age-related physical debility: Secondary | ICD-10-CM | POA: Diagnosis not present

## 2024-05-26 DIAGNOSIS — Z930 Tracheostomy status: Secondary | ICD-10-CM | POA: Diagnosis not present

## 2024-05-26 DIAGNOSIS — J9621 Acute and chronic respiratory failure with hypoxia: Secondary | ICD-10-CM | POA: Diagnosis not present

## 2024-05-27 DIAGNOSIS — K7460 Unspecified cirrhosis of liver: Secondary | ICD-10-CM | POA: Diagnosis not present

## 2024-05-27 DIAGNOSIS — R54 Age-related physical debility: Secondary | ICD-10-CM | POA: Diagnosis not present

## 2024-05-27 DIAGNOSIS — J9621 Acute and chronic respiratory failure with hypoxia: Secondary | ICD-10-CM | POA: Diagnosis not present

## 2024-05-27 DIAGNOSIS — I482 Chronic atrial fibrillation, unspecified: Secondary | ICD-10-CM | POA: Diagnosis not present

## 2024-05-27 DIAGNOSIS — I4891 Unspecified atrial fibrillation: Secondary | ICD-10-CM | POA: Diagnosis not present

## 2024-05-27 DIAGNOSIS — Z930 Tracheostomy status: Secondary | ICD-10-CM | POA: Diagnosis not present

## 2024-05-27 DIAGNOSIS — G40909 Epilepsy, unspecified, not intractable, without status epilepticus: Secondary | ICD-10-CM | POA: Diagnosis not present

## 2024-05-27 DIAGNOSIS — G9341 Metabolic encephalopathy: Secondary | ICD-10-CM

## 2024-05-28 DIAGNOSIS — I4891 Unspecified atrial fibrillation: Secondary | ICD-10-CM | POA: Diagnosis not present

## 2024-05-28 DIAGNOSIS — G40909 Epilepsy, unspecified, not intractable, without status epilepticus: Secondary | ICD-10-CM | POA: Diagnosis not present

## 2024-05-28 DIAGNOSIS — R54 Age-related physical debility: Secondary | ICD-10-CM | POA: Diagnosis not present

## 2024-05-28 DIAGNOSIS — Z930 Tracheostomy status: Secondary | ICD-10-CM | POA: Diagnosis not present

## 2024-05-28 DIAGNOSIS — J9621 Acute and chronic respiratory failure with hypoxia: Secondary | ICD-10-CM | POA: Diagnosis not present

## 2024-05-28 DIAGNOSIS — K7460 Unspecified cirrhosis of liver: Secondary | ICD-10-CM | POA: Diagnosis not present

## 2024-05-28 DIAGNOSIS — I482 Chronic atrial fibrillation, unspecified: Secondary | ICD-10-CM | POA: Diagnosis not present

## 2024-05-28 DIAGNOSIS — G9341 Metabolic encephalopathy: Secondary | ICD-10-CM

## 2024-05-29 DIAGNOSIS — G9341 Metabolic encephalopathy: Secondary | ICD-10-CM | POA: Diagnosis not present

## 2024-05-29 DIAGNOSIS — R54 Age-related physical debility: Secondary | ICD-10-CM | POA: Diagnosis not present

## 2024-05-29 DIAGNOSIS — I4891 Unspecified atrial fibrillation: Secondary | ICD-10-CM | POA: Diagnosis not present

## 2024-05-29 DIAGNOSIS — K7460 Unspecified cirrhosis of liver: Secondary | ICD-10-CM | POA: Diagnosis not present

## 2024-05-30 DIAGNOSIS — I4891 Unspecified atrial fibrillation: Secondary | ICD-10-CM | POA: Diagnosis not present

## 2024-05-30 DIAGNOSIS — K7460 Unspecified cirrhosis of liver: Secondary | ICD-10-CM | POA: Diagnosis not present

## 2024-05-30 DIAGNOSIS — R54 Age-related physical debility: Secondary | ICD-10-CM | POA: Diagnosis not present

## 2024-05-30 DIAGNOSIS — G9341 Metabolic encephalopathy: Secondary | ICD-10-CM | POA: Diagnosis not present

## 2024-05-31 DIAGNOSIS — G9341 Metabolic encephalopathy: Secondary | ICD-10-CM | POA: Diagnosis not present

## 2024-05-31 DIAGNOSIS — R54 Age-related physical debility: Secondary | ICD-10-CM | POA: Diagnosis not present

## 2024-05-31 DIAGNOSIS — K7460 Unspecified cirrhosis of liver: Secondary | ICD-10-CM | POA: Diagnosis not present

## 2024-05-31 DIAGNOSIS — I4891 Unspecified atrial fibrillation: Secondary | ICD-10-CM | POA: Diagnosis not present

## 2024-06-01 DIAGNOSIS — R54 Age-related physical debility: Secondary | ICD-10-CM | POA: Diagnosis not present

## 2024-06-01 DIAGNOSIS — I4891 Unspecified atrial fibrillation: Secondary | ICD-10-CM | POA: Diagnosis not present

## 2024-06-01 DIAGNOSIS — K7460 Unspecified cirrhosis of liver: Secondary | ICD-10-CM | POA: Diagnosis not present

## 2024-06-01 DIAGNOSIS — G9341 Metabolic encephalopathy: Secondary | ICD-10-CM | POA: Diagnosis not present

## 2024-06-06 DIAGNOSIS — I4891 Unspecified atrial fibrillation: Secondary | ICD-10-CM | POA: Diagnosis not present

## 2024-06-06 DIAGNOSIS — K7460 Unspecified cirrhosis of liver: Secondary | ICD-10-CM | POA: Diagnosis not present

## 2024-06-06 DIAGNOSIS — Z930 Tracheostomy status: Secondary | ICD-10-CM | POA: Diagnosis not present

## 2024-06-06 DIAGNOSIS — R54 Age-related physical debility: Secondary | ICD-10-CM | POA: Diagnosis not present

## 2024-06-06 DIAGNOSIS — J9621 Acute and chronic respiratory failure with hypoxia: Secondary | ICD-10-CM | POA: Diagnosis not present

## 2024-06-06 DIAGNOSIS — E871 Hypo-osmolality and hyponatremia: Secondary | ICD-10-CM | POA: Diagnosis not present

## 2024-06-06 DIAGNOSIS — G40909 Epilepsy, unspecified, not intractable, without status epilepticus: Secondary | ICD-10-CM | POA: Diagnosis not present

## 2024-06-06 DIAGNOSIS — I482 Chronic atrial fibrillation, unspecified: Secondary | ICD-10-CM | POA: Diagnosis not present

## 2024-06-06 DIAGNOSIS — G9341 Metabolic encephalopathy: Secondary | ICD-10-CM

## 2024-06-07 DIAGNOSIS — Z930 Tracheostomy status: Secondary | ICD-10-CM | POA: Diagnosis not present

## 2024-06-07 DIAGNOSIS — R54 Age-related physical debility: Secondary | ICD-10-CM | POA: Diagnosis not present

## 2024-06-07 DIAGNOSIS — K7460 Unspecified cirrhosis of liver: Secondary | ICD-10-CM | POA: Diagnosis not present

## 2024-06-07 DIAGNOSIS — I4891 Unspecified atrial fibrillation: Secondary | ICD-10-CM | POA: Diagnosis not present

## 2024-06-07 DIAGNOSIS — G9341 Metabolic encephalopathy: Secondary | ICD-10-CM

## 2024-06-07 DIAGNOSIS — J9621 Acute and chronic respiratory failure with hypoxia: Secondary | ICD-10-CM | POA: Diagnosis not present

## 2024-06-07 DIAGNOSIS — I482 Chronic atrial fibrillation, unspecified: Secondary | ICD-10-CM | POA: Diagnosis not present

## 2024-06-07 DIAGNOSIS — G40909 Epilepsy, unspecified, not intractable, without status epilepticus: Secondary | ICD-10-CM | POA: Diagnosis not present

## 2024-06-08 DIAGNOSIS — K7460 Unspecified cirrhosis of liver: Secondary | ICD-10-CM | POA: Diagnosis not present

## 2024-06-08 DIAGNOSIS — J9621 Acute and chronic respiratory failure with hypoxia: Secondary | ICD-10-CM | POA: Diagnosis not present

## 2024-06-08 DIAGNOSIS — I482 Chronic atrial fibrillation, unspecified: Secondary | ICD-10-CM | POA: Diagnosis not present

## 2024-06-08 DIAGNOSIS — I4891 Unspecified atrial fibrillation: Secondary | ICD-10-CM | POA: Diagnosis not present

## 2024-06-08 DIAGNOSIS — G40909 Epilepsy, unspecified, not intractable, without status epilepticus: Secondary | ICD-10-CM | POA: Diagnosis not present

## 2024-06-08 DIAGNOSIS — G9341 Metabolic encephalopathy: Secondary | ICD-10-CM

## 2024-06-08 DIAGNOSIS — R54 Age-related physical debility: Secondary | ICD-10-CM | POA: Diagnosis not present

## 2024-06-08 DIAGNOSIS — Z930 Tracheostomy status: Secondary | ICD-10-CM | POA: Diagnosis not present

## 2024-06-09 DIAGNOSIS — G40909 Epilepsy, unspecified, not intractable, without status epilepticus: Secondary | ICD-10-CM | POA: Diagnosis not present

## 2024-06-09 DIAGNOSIS — I4891 Unspecified atrial fibrillation: Secondary | ICD-10-CM | POA: Diagnosis not present

## 2024-06-09 DIAGNOSIS — R54 Age-related physical debility: Secondary | ICD-10-CM | POA: Diagnosis not present

## 2024-06-09 DIAGNOSIS — G9341 Metabolic encephalopathy: Secondary | ICD-10-CM

## 2024-06-09 DIAGNOSIS — K7460 Unspecified cirrhosis of liver: Secondary | ICD-10-CM | POA: Diagnosis not present

## 2024-06-09 DIAGNOSIS — J9621 Acute and chronic respiratory failure with hypoxia: Secondary | ICD-10-CM | POA: Diagnosis not present

## 2024-06-09 DIAGNOSIS — Z930 Tracheostomy status: Secondary | ICD-10-CM | POA: Diagnosis not present

## 2024-06-09 DIAGNOSIS — I482 Chronic atrial fibrillation, unspecified: Secondary | ICD-10-CM | POA: Diagnosis not present

## 2024-06-10 DIAGNOSIS — K7460 Unspecified cirrhosis of liver: Secondary | ICD-10-CM | POA: Diagnosis not present

## 2024-06-10 DIAGNOSIS — G40909 Epilepsy, unspecified, not intractable, without status epilepticus: Secondary | ICD-10-CM | POA: Diagnosis not present

## 2024-06-10 DIAGNOSIS — I4891 Unspecified atrial fibrillation: Secondary | ICD-10-CM | POA: Diagnosis not present

## 2024-06-10 DIAGNOSIS — R54 Age-related physical debility: Secondary | ICD-10-CM | POA: Diagnosis not present

## 2024-06-10 DIAGNOSIS — J9621 Acute and chronic respiratory failure with hypoxia: Secondary | ICD-10-CM | POA: Diagnosis not present

## 2024-06-10 DIAGNOSIS — G9341 Metabolic encephalopathy: Secondary | ICD-10-CM

## 2024-06-10 DIAGNOSIS — Z930 Tracheostomy status: Secondary | ICD-10-CM | POA: Diagnosis not present

## 2024-06-10 DIAGNOSIS — I482 Chronic atrial fibrillation, unspecified: Secondary | ICD-10-CM | POA: Diagnosis not present

## 2024-06-11 DIAGNOSIS — I4891 Unspecified atrial fibrillation: Secondary | ICD-10-CM | POA: Diagnosis not present

## 2024-06-11 DIAGNOSIS — J9621 Acute and chronic respiratory failure with hypoxia: Secondary | ICD-10-CM | POA: Diagnosis not present

## 2024-06-11 DIAGNOSIS — Z930 Tracheostomy status: Secondary | ICD-10-CM | POA: Diagnosis not present

## 2024-06-11 DIAGNOSIS — R54 Age-related physical debility: Secondary | ICD-10-CM | POA: Diagnosis not present

## 2024-06-11 DIAGNOSIS — K7460 Unspecified cirrhosis of liver: Secondary | ICD-10-CM | POA: Diagnosis not present

## 2024-06-11 DIAGNOSIS — G9341 Metabolic encephalopathy: Secondary | ICD-10-CM

## 2024-06-11 DIAGNOSIS — G40909 Epilepsy, unspecified, not intractable, without status epilepticus: Secondary | ICD-10-CM | POA: Diagnosis not present

## 2024-06-11 DIAGNOSIS — I482 Chronic atrial fibrillation, unspecified: Secondary | ICD-10-CM | POA: Diagnosis not present

## 2024-06-12 DIAGNOSIS — G40909 Epilepsy, unspecified, not intractable, without status epilepticus: Secondary | ICD-10-CM | POA: Diagnosis not present

## 2024-06-12 DIAGNOSIS — J9621 Acute and chronic respiratory failure with hypoxia: Secondary | ICD-10-CM | POA: Diagnosis not present

## 2024-06-12 DIAGNOSIS — Z930 Tracheostomy status: Secondary | ICD-10-CM | POA: Diagnosis not present

## 2024-06-12 DIAGNOSIS — R54 Age-related physical debility: Secondary | ICD-10-CM | POA: Diagnosis not present

## 2024-06-12 DIAGNOSIS — G9341 Metabolic encephalopathy: Secondary | ICD-10-CM

## 2024-06-12 DIAGNOSIS — I482 Chronic atrial fibrillation, unspecified: Secondary | ICD-10-CM | POA: Diagnosis not present

## 2024-06-12 DIAGNOSIS — K7460 Unspecified cirrhosis of liver: Secondary | ICD-10-CM | POA: Diagnosis not present

## 2024-06-12 DIAGNOSIS — I4891 Unspecified atrial fibrillation: Secondary | ICD-10-CM | POA: Diagnosis not present

## 2024-06-13 DIAGNOSIS — N390 Urinary tract infection, site not specified: Secondary | ICD-10-CM | POA: Diagnosis not present

## 2024-06-13 DIAGNOSIS — G9341 Metabolic encephalopathy: Secondary | ICD-10-CM | POA: Diagnosis not present

## 2024-06-13 DIAGNOSIS — E871 Hypo-osmolality and hyponatremia: Secondary | ICD-10-CM | POA: Diagnosis not present

## 2024-06-13 DIAGNOSIS — I4891 Unspecified atrial fibrillation: Secondary | ICD-10-CM | POA: Diagnosis not present

## 2024-06-13 DIAGNOSIS — K7460 Unspecified cirrhosis of liver: Secondary | ICD-10-CM | POA: Diagnosis not present

## 2024-06-13 DIAGNOSIS — R945 Abnormal results of liver function studies: Secondary | ICD-10-CM | POA: Diagnosis not present

## 2024-06-13 DIAGNOSIS — R54 Age-related physical debility: Secondary | ICD-10-CM | POA: Diagnosis not present

## 2024-06-14 DIAGNOSIS — E871 Hypo-osmolality and hyponatremia: Secondary | ICD-10-CM | POA: Diagnosis not present

## 2024-06-14 DIAGNOSIS — R54 Age-related physical debility: Secondary | ICD-10-CM | POA: Diagnosis not present

## 2024-06-14 DIAGNOSIS — I4891 Unspecified atrial fibrillation: Secondary | ICD-10-CM | POA: Diagnosis not present

## 2024-06-14 DIAGNOSIS — G9341 Metabolic encephalopathy: Secondary | ICD-10-CM | POA: Diagnosis not present

## 2024-06-15 DIAGNOSIS — R54 Age-related physical debility: Secondary | ICD-10-CM | POA: Diagnosis not present

## 2024-06-15 DIAGNOSIS — G9341 Metabolic encephalopathy: Secondary | ICD-10-CM | POA: Diagnosis not present

## 2024-06-15 DIAGNOSIS — I4891 Unspecified atrial fibrillation: Secondary | ICD-10-CM | POA: Diagnosis not present

## 2024-06-15 DIAGNOSIS — E871 Hypo-osmolality and hyponatremia: Secondary | ICD-10-CM | POA: Diagnosis not present

## 2024-06-20 DIAGNOSIS — E871 Hypo-osmolality and hyponatremia: Secondary | ICD-10-CM | POA: Diagnosis not present

## 2024-06-20 DIAGNOSIS — I4891 Unspecified atrial fibrillation: Secondary | ICD-10-CM | POA: Diagnosis not present

## 2024-06-20 DIAGNOSIS — R945 Abnormal results of liver function studies: Secondary | ICD-10-CM | POA: Diagnosis not present

## 2024-06-20 DIAGNOSIS — K7460 Unspecified cirrhosis of liver: Secondary | ICD-10-CM | POA: Diagnosis not present

## 2024-06-20 DIAGNOSIS — G9341 Metabolic encephalopathy: Secondary | ICD-10-CM | POA: Diagnosis not present

## 2024-06-20 DIAGNOSIS — R54 Age-related physical debility: Secondary | ICD-10-CM | POA: Diagnosis not present

## 2024-06-20 DIAGNOSIS — I482 Chronic atrial fibrillation, unspecified: Secondary | ICD-10-CM | POA: Diagnosis not present

## 2024-06-20 DIAGNOSIS — G40909 Epilepsy, unspecified, not intractable, without status epilepticus: Secondary | ICD-10-CM | POA: Diagnosis not present

## 2024-06-20 DIAGNOSIS — J9621 Acute and chronic respiratory failure with hypoxia: Secondary | ICD-10-CM | POA: Diagnosis not present

## 2024-06-20 DIAGNOSIS — Z930 Tracheostomy status: Secondary | ICD-10-CM | POA: Diagnosis not present

## 2024-06-21 DIAGNOSIS — R54 Age-related physical debility: Secondary | ICD-10-CM | POA: Diagnosis not present

## 2024-06-21 DIAGNOSIS — E871 Hypo-osmolality and hyponatremia: Secondary | ICD-10-CM | POA: Diagnosis not present

## 2024-06-21 DIAGNOSIS — Z930 Tracheostomy status: Secondary | ICD-10-CM | POA: Diagnosis not present

## 2024-06-21 DIAGNOSIS — J9621 Acute and chronic respiratory failure with hypoxia: Secondary | ICD-10-CM | POA: Diagnosis not present

## 2024-06-21 DIAGNOSIS — I482 Chronic atrial fibrillation, unspecified: Secondary | ICD-10-CM | POA: Diagnosis not present

## 2024-06-21 DIAGNOSIS — G40909 Epilepsy, unspecified, not intractable, without status epilepticus: Secondary | ICD-10-CM | POA: Diagnosis not present

## 2024-06-21 DIAGNOSIS — G9341 Metabolic encephalopathy: Secondary | ICD-10-CM | POA: Diagnosis not present

## 2024-06-21 DIAGNOSIS — I4891 Unspecified atrial fibrillation: Secondary | ICD-10-CM | POA: Diagnosis not present

## 2024-06-22 DIAGNOSIS — G9341 Metabolic encephalopathy: Secondary | ICD-10-CM | POA: Diagnosis not present

## 2024-06-22 DIAGNOSIS — E871 Hypo-osmolality and hyponatremia: Secondary | ICD-10-CM | POA: Diagnosis not present

## 2024-06-22 DIAGNOSIS — R54 Age-related physical debility: Secondary | ICD-10-CM | POA: Diagnosis not present

## 2024-06-22 DIAGNOSIS — I4891 Unspecified atrial fibrillation: Secondary | ICD-10-CM | POA: Diagnosis not present

## 2024-06-27 DIAGNOSIS — R945 Abnormal results of liver function studies: Secondary | ICD-10-CM

## 2024-06-27 DIAGNOSIS — E871 Hypo-osmolality and hyponatremia: Secondary | ICD-10-CM

## 2024-06-27 DIAGNOSIS — K7460 Unspecified cirrhosis of liver: Secondary | ICD-10-CM

## 2024-06-27 DIAGNOSIS — R54 Age-related physical debility: Secondary | ICD-10-CM

## 2024-06-27 DIAGNOSIS — G9341 Metabolic encephalopathy: Secondary | ICD-10-CM

## 2024-06-27 DIAGNOSIS — I4891 Unspecified atrial fibrillation: Secondary | ICD-10-CM
